# Patient Record
Sex: Male | Born: 1955 | Hispanic: Yes | Smoking: Former smoker
Health system: Southern US, Community
[De-identification: ages and names within clinical notes are randomized; demographics above are authoritative.]

## PROBLEM LIST (undated history)

## (undated) DIAGNOSIS — D696 Thrombocytopenia, unspecified: Secondary | ICD-10-CM

## (undated) DIAGNOSIS — Z86010 Personal history of colonic polyps: Secondary | ICD-10-CM

## (undated) DIAGNOSIS — K760 Fatty (change of) liver, not elsewhere classified: Secondary | ICD-10-CM

## (undated) DIAGNOSIS — M5030 Other cervical disc degeneration, unspecified cervical region: Secondary | ICD-10-CM

## (undated) DIAGNOSIS — M5137 Other intervertebral disc degeneration, lumbosacral region: Secondary | ICD-10-CM

## (undated) DIAGNOSIS — J439 Emphysema, unspecified: Secondary | ICD-10-CM

## (undated) DIAGNOSIS — K219 Gastro-esophageal reflux disease without esophagitis: Secondary | ICD-10-CM

## (undated) DIAGNOSIS — E119 Type 2 diabetes mellitus without complications: Secondary | ICD-10-CM

## (undated) DIAGNOSIS — Z9981 Dependence on supplemental oxygen: Secondary | ICD-10-CM

## (undated) DIAGNOSIS — K08109 Complete loss of teeth, unspecified cause, unspecified class: Secondary | ICD-10-CM

## (undated) DIAGNOSIS — I10 Essential (primary) hypertension: Secondary | ICD-10-CM

## (undated) DIAGNOSIS — Z794 Long term (current) use of insulin: Secondary | ICD-10-CM

## (undated) DIAGNOSIS — K5730 Diverticulosis of large intestine without perforation or abscess without bleeding: Secondary | ICD-10-CM

## (undated) DIAGNOSIS — J410 Simple chronic bronchitis: Secondary | ICD-10-CM

## (undated) DIAGNOSIS — I671 Cerebral aneurysm, nonruptured: Secondary | ICD-10-CM

## (undated) DIAGNOSIS — Z972 Presence of dental prosthetic device (complete) (partial): Secondary | ICD-10-CM

## (undated) DIAGNOSIS — G43909 Migraine, unspecified, not intractable, without status migrainosus: Secondary | ICD-10-CM

## (undated) DIAGNOSIS — Z860101 Personal history of adenomatous and serrated colon polyps: Secondary | ICD-10-CM

## (undated) DIAGNOSIS — Z8711 Personal history of peptic ulcer disease: Secondary | ICD-10-CM

## (undated) DIAGNOSIS — E785 Hyperlipidemia, unspecified: Secondary | ICD-10-CM

## (undated) DIAGNOSIS — I2510 Atherosclerotic heart disease of native coronary artery without angina pectoris: Secondary | ICD-10-CM

## (undated) DIAGNOSIS — Z973 Presence of spectacles and contact lenses: Secondary | ICD-10-CM

## (undated) DIAGNOSIS — Z8781 Personal history of (healed) traumatic fracture: Secondary | ICD-10-CM

## (undated) DIAGNOSIS — N529 Male erectile dysfunction, unspecified: Secondary | ICD-10-CM

## (undated) DIAGNOSIS — I252 Old myocardial infarction: Secondary | ICD-10-CM

## (undated) DIAGNOSIS — R0609 Other forms of dyspnea: Secondary | ICD-10-CM

## (undated) HISTORY — PX: UPPER GASTROINTESTINAL ENDOSCOPY: SHX188

## (undated) HISTORY — PX: SHOULDER ARTHROSCOPY W/ ROTATOR CUFF REPAIR: SHX2400

## (undated) HISTORY — DX: Emphysema, unspecified: J43.9

## (undated) HISTORY — PX: HERNIA REPAIR: SHX51

## (undated) HISTORY — PX: FOOT SURGERY: SHX648

## (undated) HISTORY — PX: PELVIC FRACTURE SURGERY: SHX119

---

## 1997-07-04 HISTORY — PX: FOOT SURGERY: SHX648

## 1997-07-04 HISTORY — PX: PELVIC FRACTURE SURGERY: SHX119

## 2008-01-04 ENCOUNTER — Emergency Department (HOSPITAL_COMMUNITY): Admission: EM | Admit: 2008-01-04 | Discharge: 2008-01-04 | Payer: Self-pay | Admitting: Emergency Medicine

## 2010-10-17 ENCOUNTER — Emergency Department (HOSPITAL_COMMUNITY)
Admission: EM | Admit: 2010-10-17 | Discharge: 2010-10-18 | Disposition: A | Discharge status: Home / Self Care | Payer: BC Managed Care – PPO | Attending: Emergency Medicine | Admitting: Emergency Medicine

## 2010-10-17 ENCOUNTER — Emergency Department (HOSPITAL_COMMUNITY): Payer: BC Managed Care – PPO

## 2010-10-17 DIAGNOSIS — R062 Wheezing: Secondary | ICD-10-CM | POA: Insufficient documentation

## 2010-10-17 DIAGNOSIS — R05 Cough: Secondary | ICD-10-CM | POA: Insufficient documentation

## 2010-10-17 DIAGNOSIS — R0989 Other specified symptoms and signs involving the circulatory and respiratory systems: Secondary | ICD-10-CM | POA: Insufficient documentation

## 2010-10-17 DIAGNOSIS — R059 Cough, unspecified: Secondary | ICD-10-CM | POA: Insufficient documentation

## 2010-10-17 DIAGNOSIS — R0609 Other forms of dyspnea: Secondary | ICD-10-CM | POA: Insufficient documentation

## 2010-10-17 DIAGNOSIS — J4489 Other specified chronic obstructive pulmonary disease: Secondary | ICD-10-CM | POA: Insufficient documentation

## 2010-10-17 DIAGNOSIS — J449 Chronic obstructive pulmonary disease, unspecified: Secondary | ICD-10-CM | POA: Insufficient documentation

## 2011-03-31 LAB — CBC
Hemoglobin: 16.8
MCV: 95.7
RBC: 5.07
WBC: 7.8

## 2011-03-31 LAB — POCT I-STAT, CHEM 8
Calcium, Ion: 1.1 — ABNORMAL LOW
Glucose, Bld: 137 — ABNORMAL HIGH
HCT: 52
Hemoglobin: 17.7 — ABNORMAL HIGH

## 2011-03-31 LAB — POCT CARDIAC MARKERS
CKMB, poc: 1.1
Myoglobin, poc: 60
Troponin i, poc: <0.05

## 2011-03-31 LAB — PROTIME-INR
INR: 0.9
Prothrombin Time: 12.8

## 2012-04-13 ENCOUNTER — Emergency Department (HOSPITAL_COMMUNITY): Payer: BC Managed Care – PPO

## 2012-04-13 ENCOUNTER — Emergency Department (HOSPITAL_COMMUNITY)
Admission: EM | Admit: 2012-04-13 | Discharge: 2012-04-14 | Disposition: A | Discharge status: Home / Self Care | Payer: BC Managed Care – PPO | Attending: Emergency Medicine | Admitting: Emergency Medicine

## 2012-04-13 ENCOUNTER — Encounter (HOSPITAL_COMMUNITY): Payer: Self-pay | Admitting: Emergency Medicine

## 2012-04-13 DIAGNOSIS — F17200 Nicotine dependence, unspecified, uncomplicated: Secondary | ICD-10-CM | POA: Insufficient documentation

## 2012-04-13 DIAGNOSIS — S42109A Fracture of unspecified part of scapula, unspecified shoulder, initial encounter for closed fracture: Secondary | ICD-10-CM

## 2012-04-13 DIAGNOSIS — M549 Dorsalgia, unspecified: Secondary | ICD-10-CM | POA: Insufficient documentation

## 2012-04-13 DIAGNOSIS — R109 Unspecified abdominal pain: Secondary | ICD-10-CM | POA: Insufficient documentation

## 2012-04-13 DIAGNOSIS — M25559 Pain in unspecified hip: Secondary | ICD-10-CM | POA: Insufficient documentation

## 2012-04-13 DIAGNOSIS — R937 Abnormal findings on diagnostic imaging of other parts of musculoskeletal system: Secondary | ICD-10-CM | POA: Insufficient documentation

## 2012-04-13 MED ORDER — ONDANSETRON HCL 4 MG/2ML IJ SOLN
4.0000 mg | Freq: Once | INTRAMUSCULAR | Status: AC
  Administered 2012-04-13: 4 mg via INTRAVENOUS
  Filled 2012-04-13: qty 2

## 2012-04-13 MED ORDER — HYDROMORPHONE HCL PF 1 MG/ML IJ SOLN
0.5000 mg | Freq: Once | INTRAMUSCULAR | Status: AC
  Administered 2012-04-13: 0.5 mg via INTRAVENOUS
  Filled 2012-04-13: qty 1

## 2012-04-13 MED ORDER — HYDROMORPHONE HCL PF 1 MG/ML IJ SOLN
1.0000 mg | Freq: Once | INTRAMUSCULAR | Status: AC
  Administered 2012-04-13: 1 mg via INTRAVENOUS

## 2012-04-13 MED ORDER — IOHEXOL 300 MG/ML  SOLN
100.0000 mL | Freq: Once | INTRAMUSCULAR | Status: AC | PRN
  Administered 2012-04-13: 100 mL via INTRAVENOUS

## 2012-04-13 MED ORDER — HYDROMORPHONE HCL PF 1 MG/ML IJ SOLN
INTRAMUSCULAR | Status: AC
  Filled 2012-04-13: qty 1
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
## 2012-04-14 MED ORDER — HYDROCODONE-ACETAMINOPHEN 5-325 MG PO TABS: 1.0000 | ORAL_TABLET | ORAL | Status: DC | PRN

## 2012-04-14 MED ORDER — CYCLOBENZAPRINE HCL 10 MG PO TABS: 10.0000 mg | ORAL_TABLET | Freq: Two times a day (BID) | ORAL | Status: DC | PRN

## 2012-04-14 MED ORDER — HYDROMORPHONE HCL PF 1 MG/ML IJ SOLN
0.5000 mg | Freq: Once | INTRAMUSCULAR | Status: AC
  Administered 2012-04-14: 0.5 mg via INTRAVENOUS
  Filled 2012-04-14: qty 1
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
## 2012-04-14 NOTE — ED Notes (Signed)
Per PA, the pt will need a shoulder immobilizer before discharge. Ortho Tech notified.

## 2012-12-03 ENCOUNTER — Encounter (HOSPITAL_COMMUNITY): Payer: Self-pay

## 2012-12-03 ENCOUNTER — Emergency Department (HOSPITAL_COMMUNITY): Payer: Self-pay

## 2012-12-03 ENCOUNTER — Emergency Department (HOSPITAL_COMMUNITY)
Admission: EM | Admit: 2012-12-03 | Discharge: 2012-12-04 | Disposition: A | Discharge status: Home / Self Care | Payer: Self-pay | Attending: Emergency Medicine | Admitting: Emergency Medicine

## 2012-12-03 DIAGNOSIS — J209 Acute bronchitis, unspecified: Secondary | ICD-10-CM | POA: Insufficient documentation

## 2012-12-03 DIAGNOSIS — F17200 Nicotine dependence, unspecified, uncomplicated: Secondary | ICD-10-CM | POA: Insufficient documentation

## 2012-12-03 DIAGNOSIS — J449 Chronic obstructive pulmonary disease, unspecified: Secondary | ICD-10-CM

## 2012-12-03 DIAGNOSIS — J4489 Other specified chronic obstructive pulmonary disease: Secondary | ICD-10-CM | POA: Insufficient documentation

## 2012-12-03 LAB — CBC
HCT: 43.9 % (ref 39.0–52.0)
Hemoglobin: 15.5 g/dL (ref 13.0–17.0)
MCH: 32.8 pg (ref 26.0–34.0)
MCV: 92.8 fL (ref 78.0–100.0)
RBC: 4.73 MIL/uL (ref 4.22–5.81)

## 2012-12-03 LAB — BASIC METABOLIC PANEL
CO2: 23 mEq/L (ref 19–32)
Calcium: 9.5 mg/dL (ref 8.4–10.5)
Chloride: 101 mEq/L (ref 96–112)
Creatinine, Ser: 0.9 mg/dL (ref 0.50–1.35)
Glucose, Bld: 77 mg/dL (ref 70–99)

## 2012-12-03 LAB — POCT I-STAT TROPONIN I: Troponin i, poc: 0 ng/mL (ref 0.00–0.08)

## 2012-12-03 MED ORDER — ALBUTEROL SULFATE (5 MG/ML) 0.5% IN NEBU
5.0000 mg | INHALATION_SOLUTION | Freq: Once | RESPIRATORY_TRACT | Status: AC
  Administered 2012-12-03: 5 mg via RESPIRATORY_TRACT
  Filled 2012-12-03: qty 1

## 2012-12-03 MED ORDER — SODIUM CHLORIDE 0.9 % IV SOLN
INTRAVENOUS | Status: DC
  Administered 2012-12-03: via INTRAVENOUS

## 2012-12-03 MED ORDER — IPRATROPIUM BROMIDE 0.02 % IN SOLN
0.5000 mg | Freq: Once | RESPIRATORY_TRACT | Status: AC
  Administered 2012-12-03: 0.5 mg via RESPIRATORY_TRACT
  Filled 2012-12-03: qty 2.5

## 2012-12-03 MED ORDER — ALBUTEROL SULFATE HFA 108 (90 BASE) MCG/ACT IN AERS
2.0000 | INHALATION_SPRAY | RESPIRATORY_TRACT | Status: DC | PRN
  Administered 2012-12-04: 2 via RESPIRATORY_TRACT
  Filled 2012-12-03: qty 6.7

## 2012-12-03 MED ORDER — METHYLPREDNISOLONE SODIUM SUCC 125 MG IJ SOLR
125.0000 mg | Freq: Once | INTRAMUSCULAR | Status: AC
  Administered 2012-12-03: 125 mg via INTRAVENOUS
  Filled 2012-12-03: qty 2

## 2012-12-03 MED ORDER — AEROCHAMBER Z-STAT PLUS/MEDIUM MISC
1.0000 | Freq: Once | Status: AC
  Administered 2012-12-04: 1

## 2012-12-03 MED ORDER — SODIUM CHLORIDE 0.9 % IV BOLUS (SEPSIS)
1000.0000 mL | Freq: Once | INTRAVENOUS | Status: AC
  Administered 2012-12-03: 1000 mL via INTRAVENOUS

## 2012-12-03 NOTE — ED Notes (Signed)
Family at bedside. 

## 2012-12-03 NOTE — ED Provider Notes (Signed)
History     CSN: [id number]  Arrival date & time 12/03/12  2139   First MD Initiated Contact with Patient 12/03/12 2231      Chief Complaint  Patient presents with  . Shortness of Breath    (Consider location/radiation/quality/duration/timing/severity/associated sxs/prior treatment) HPI Comments: Duane Cuevas is a 57 y.o. Male who complains of intermittent cough and shortness of breath for several months. The symptoms are aggravated by working outside. He continues to smoke. He denies fever, chills, nausea, vomiting, weakness, or dizziness. He's not currently taking any medications for his chronic emphysema. He has mild, left anterior chest pain, only when he coughs. The cough is productive of sputum, but he does not know what color it is. There are no other known modifying factors.  Patient is a 57 y.o. male presenting with shortness of breath. The history is provided by the patient.  Shortness of Breath   History reviewed. No pertinent past medical history.  Past Surgical History  Procedure Laterality Date  . Foot surgery      History reviewed. No pertinent family history.  History  Substance Use Topics  . Smoking status: Current Every Day Smoker -- 0.50 packs/day    Types: Cigarettes  . Smokeless tobacco: Never Used  . Alcohol Use: 3.6 oz/week    6 Cans of beer per week      Review of Systems  Respiratory: Positive for shortness of breath.   All other systems reviewed and are negative.    Allergies  Review of patient's allergies indicates no known allergies.  Home Medications   Current Outpatient Rx  Name  Route  Sig  Dispense  Refill  . predniSONE (DELTASONE) 20 MG tablet   Oral   Take 1 tablet (20 mg total) by mouth 2 (two) times daily.   10 tablet   0     BP 118/75  Pulse 81  Temp(Src) 98.9 F (37.2 C) (Oral)  Resp 20  SpO2 97%  Physical Exam  Nursing note and vitals reviewed. Constitutional: He is oriented to person, place, and time.  He appears well-developed and well-nourished.  HENT:  Head: Normocephalic and atraumatic.  Right Ear: External ear normal.  Left Ear: External ear normal.  Eyes: Conjunctivae and EOM are normal. Pupils are equal, round, and reactive to light.  Neck: Normal range of motion and phonation normal. Neck supple.  Cardiovascular: Normal rate, regular rhythm, normal heart sounds and intact distal pulses.   Pulmonary/Chest: Effort normal. No respiratory distress. He has wheezes. He exhibits no bony tenderness.  Decreased air movement bilaterally. No intercostal retractions. No increased work of breathing.  Abdominal: Soft. Normal appearance. There is no tenderness.  Musculoskeletal: Normal range of motion.  Neurological: He is alert and oriented to person, place, and time. He has normal strength. No cranial nerve deficit or sensory deficit. He exhibits normal muscle tone. Coordination normal.  Skin: Skin is warm, dry and intact.  Psychiatric: He has a normal mood and affect. His behavior is normal. Judgment and thought content normal.    ED Course  Procedures (including critical care time)  Initial evaluation is consistent with dehydration and bronchospasm. He'll be assessed for pneumonia. Signs of cardiac compromise and metabolic instability.   Medications  0.9 %  sodium chloride infusion ( Intravenous New Bag/Given 12/03/12 2345)  albuterol (PROVENTIL HFA;VENTOLIN HFA) 108 (90 BASE) MCG/ACT inhaler 2 puff (2 puffs Inhalation Given 12/04/12 0012)  aerochamber Z-Stat Plus/medium 1 each (not administered)  sodium chloride 0.9 % bolus  1,000 mL (0 mLs Intravenous Stopped 12/04/12 0039)  albuterol (PROVENTIL) (5 MG/ML) 0.5% nebulizer solution 5 mg (5 mg Nebulization Given 12/03/12 2323)  ipratropium (ATROVENT) nebulizer solution 0.5 mg (0.5 mg Nebulization Given 12/03/12 2323)  methylPREDNISolone sodium succinate (SOLU-MEDROL) 125 mg/2 mL injection 125 mg (125 mg Intravenous Given 12/03/12 2341)    Patient  Vitals for the past 24 hrs:  BP Temp Temp src Pulse Resp SpO2  12/04/12 0044 118/75 mmHg - - 81 20 97 %  12/04/12 0012 - - - - - 96 %  12/03/12 2325 - - - - - 100 %  12/03/12 2154 96/71 mmHg 98.9 F (37.2 C) Oral - 24 96 %     Date: 12/03/12  Rate: 75  Rhythm: normal sinus rhythm  QRS Axis: normal  PR and QT Intervals: normal  ST/T Wave abnormalities: nonspecific T wave changes  PR and QRS Conduction Disutrbances:none  Narrative Interpretation:   Old EKG Reviewed: unchanged- 10/17/10    Labs Reviewed  CBC  BASIC METABOLIC PANEL  PRO B NATRIURETIC PEPTIDE  POCT I-STAT TROPONIN I   Dg Chest 2 View  12/03/2012   *RADIOLOGY REPORT*  Clinical Data: Shortness of breath and cough  CHEST - 2 VIEW  Comparison: CT 04/13/2012  Findings: Left lower lobe granuloma is stable. Cardiomediastinal silhouette is within normal limits. The lungs are clear. No pleural effusion.  No pneumothorax.  No acute osseous abnormality.  IMPRESSION: Normal chest.   Original Report Authenticated By: Christiana Pellant, M.D.     1. COPD (chronic obstructive pulmonary disease)   2. Bronchitis, acute       MDM  Evaluation consistent with exacerbation of COPD. And pneumonia, PE, or ACD. Doubt metabolic instability, serious bacterial infection or impending vascular collapse; the patient is stable for discharge.   Nursing Notes Reviewed/ Care Coordinated, and agree without changes. Applicable Imaging Reviewed.  Interpretation of Laboratory Data incorporated into ED treatment    Plan: Home Medications- Prednisone, Albuterol Inhaler; Home Treatments- Stop Smoking; Recommended follow up- PCP of choice in 1 week          Flint Melter, MD 12/04/12 [phone]

## 2012-12-03 NOTE — ED Notes (Signed)
Pt complains of having trouble breathing since this evening, he's been coughing for several days, hx of emphysema

## 2012-12-04 MED ORDER — PREDNISONE 20 MG PO TABS: 20.0000 mg | ORAL_TABLET | Freq: Two times a day (BID) | ORAL | Status: DC

## 2012-12-04 NOTE — ED Notes (Signed)
Ambulated pt in hallway.  Pt had no complaints of SOB or dizziness.  O2 stats: 95-97%, pulse 93-95, during ambulation.

## 2013-07-31 ENCOUNTER — Emergency Department (HOSPITAL_COMMUNITY): Payer: Self-pay

## 2013-07-31 ENCOUNTER — Emergency Department (HOSPITAL_COMMUNITY)
Admission: EM | Admit: 2013-07-31 | Discharge: 2013-08-01 | Disposition: A | Discharge status: Home / Self Care | Payer: Self-pay | Attending: Emergency Medicine | Admitting: Emergency Medicine

## 2013-07-31 ENCOUNTER — Encounter (HOSPITAL_COMMUNITY): Payer: Self-pay | Admitting: Emergency Medicine

## 2013-07-31 DIAGNOSIS — R000 Tachycardia, unspecified: Secondary | ICD-10-CM | POA: Insufficient documentation

## 2013-07-31 DIAGNOSIS — J438 Other emphysema: Secondary | ICD-10-CM | POA: Insufficient documentation

## 2013-07-31 DIAGNOSIS — F17200 Nicotine dependence, unspecified, uncomplicated: Secondary | ICD-10-CM | POA: Insufficient documentation

## 2013-07-31 DIAGNOSIS — Z79899 Other long term (current) drug therapy: Secondary | ICD-10-CM | POA: Insufficient documentation

## 2013-07-31 DIAGNOSIS — J441 Chronic obstructive pulmonary disease with (acute) exacerbation: Secondary | ICD-10-CM

## 2013-07-31 HISTORY — DX: Emphysema, unspecified: J43.9

## 2013-07-31 MED ORDER — GUAIFENESIN 100 MG/5ML PO LIQD: 100.0000 mg | ORAL | Status: DC | PRN

## 2013-07-31 MED ORDER — ALBUTEROL SULFATE HFA 108 (90 BASE) MCG/ACT IN AERS
2.0000 | INHALATION_SPRAY | RESPIRATORY_TRACT | Status: DC | PRN
  Administered 2013-08-01: 2 via RESPIRATORY_TRACT
  Filled 2013-07-31: qty 6.7

## 2013-07-31 MED ORDER — IPRATROPIUM-ALBUTEROL 0.5-2.5 (3) MG/3ML IN SOLN
3.0000 mL | Freq: Once | RESPIRATORY_TRACT | Status: AC
  Administered 2013-07-31: 3 mL via RESPIRATORY_TRACT
  Filled 2013-07-31: qty 3

## 2013-07-31 MED ORDER — ALBUTEROL SULFATE (2.5 MG/3ML) 0.083% IN NEBU
5.0000 mg | INHALATION_SOLUTION | Freq: Once | RESPIRATORY_TRACT | Status: AC
  Administered 2013-07-31: 2.5 mg via RESPIRATORY_TRACT
  Filled 2013-07-31: qty 6

## 2013-07-31 MED ORDER — IPRATROPIUM BROMIDE 0.02 % IN SOLN: 0.5000 mg | Freq: Once | RESPIRATORY_TRACT | Status: DC

## 2013-07-31 MED ORDER — PREDNISONE 20 MG PO TABS
60.0000 mg | ORAL_TABLET | Freq: Once | ORAL | Status: AC
  Administered 2013-07-31: 60 mg via ORAL
  Filled 2013-07-31: qty 3

## 2013-07-31 MED ORDER — BENZONATATE 100 MG PO CAPS
100.0000 mg | ORAL_CAPSULE | Freq: Once | ORAL | Status: AC
  Administered 2013-07-31: 100 mg via ORAL
  Filled 2013-07-31: qty 1

## 2013-07-31 MED ORDER — BENZONATATE 100 MG PO CAPS: 100.0000 mg | ORAL_CAPSULE | Freq: Three times a day (TID) | ORAL | Status: DC

## 2013-07-31 MED ORDER — PREDNISONE 20 MG PO TABS: ORAL_TABLET | ORAL | Status: DC

## 2013-07-31 NOTE — Discharge Instructions (Signed)
Enfermedad pulmonar obstructiva crnica (Chronic Obstructive Pulmonary Disease) La enfermedad pulmonar obstructiva crnica (EPOC) es una enfermedad pulmonar comn en la que el flujo de aire que proviene de los pulmones est restringido. EPOC es un trmino general que puede utilizarse para describir muchas enfermedades pulmonares distintas que restringen el flujo de aire, incluidos bronquitis crnica y enfisema. Si tiene EPOC, el funcionamiento de sus pulmones probablemente nunca vuelva a ser normal, pero puede tomar medidas para mejorar la funcin pulmonar y comenzar a Sports administrator.  CAUSAS   Fumar (comn).  Exposicin al humo como fumador pasivo.  Problemas genticos.  Enfermedades pulmonares inflamatorias crnicas o infecciones recurrentes. SNTOMAS   Falta de aire, especialmente con la actividad fsica.  Tos profunda, persistente (crnica) con gran produccin de moco espeso.  Sibilancias.  Respiraciones rpidas (taquipnea).  Coloracin gris o azulada de la piel (cianosis), especialmente en los dedos de las manos, de los pies o en los labios.  Fatiga.  Prdida de peso.  Infecciones frecuentes o episodios en los que los sntomas respiratorios empeoran mucho (exacerbaciones).  Opresin en el pecho. DIAGNSTICO  El mdico le har una historia clnica y un examen fsico para Optometrist un diagnstico inicial. Las pruebas adicionales para el EPOC incluyen:   Pruebas de la funcin pulmonar.  Radiografa de trax.  Tomografa computada.  Anlisis de [place]. TRATAMIENTO  El tratamiento disponible para ayudarlo a sentirse mejor cuando tiene EPOC incluye lo siguiente:   Inhaladores y nebulizadores. Estos medicamentos ayudan a Chief Technology Officer los sntomas de EPOC y hacen que la respiracin sea ms cmoda.  Oxgeno complementario. El oxgeno complementario solo es til si tiene un nivel bajo de oxgeno en la sangre.  Ejercicios y [place] fsica. Estos son beneficiosos para  prcticamente todas las personas con EPOC. Algunas personas tambin pueden beneficiarse con un programa de rehabilitacin pulmonar. [place] todos los medicamentos (inhalados o pldoras) Programmer, systems.  Slo tome medicamentos de venta libre o recetados para Glass blower/designer, Health and safety inspector o bajar la fiebre, segn las indicaciones de su mdico.  Evite los medicamentos de venta libre o jarabes para la tos que secan las vas respiratorias (como los antihistamnicos) y Adult nurse eliminacin de las secreciones, a menos que su mdico le indique lo contrario.  Si es fumador, lo ms importante que puede hacer es dejar el hbito. Si contina fumando, el dao a los pulmones ser mayor y habr ms problemas respiratorios. Hable con su mdico para obtener ayuda para dejar el cigarrillo. l podr recomendarle recursos u hospitales comunitarios que brindan apoyo.  Evite la exposicin a factores irritantes, como el humo, productos qumicos y vapores, porque pueden agravar el trastorno.  Use la terapia con oxgeno y la rehabilitacin pulmonar si se lo indica su mdico. Si requiere terapia con oxgeno en su casa, consulte a su mdico si debe comprar un pulsioxmetro para medir su nivel de oxgeno en casa.  Evite el contacto con personas que tengan una enfermedad contagiosa.  Evite los cambios extremos de temperatura y humedad .  Consuma alimentos saludables. Consumir porciones ms pequeas y frecuentes y Production assistant, radio antes de las comidas puede ayudar a Theatre manager su fuerza.  Mantngase activo, pero equilibre la [place] con perodos de descanso. La prctica de ejercicios y [place] fsica lo ayudar a Theatre manager la capacidad de Field seismologist las cosas que desea Field seismologist.  Si tiene EPOC, es muy importante prevenir la infeccin y la hospitalizacin. Asegrese de recibir todas las [employer]  recomiende el mdico, especialmente las vacunas contra el neumococo y la gripe.  Pregntele a su mdico si necesita una vacuna contra la neumona.  Aprenda y utilice tcnicas de relajacin para controlar el estrs.  Aprenda y [place] tcnicas de respiracin controlada segn las indicaciones de su mdico. Las tcnicas de respiracin controlada incluyen:  Respiracin con los labios fruncidos. Comience inspirando (inhalando) por la nariz durante 1segundo. Luego frunza los labios como si fuera a silbar y espire (exhale) por los labios fruncidos durante 2segundos.  Respiracin diafragmtica. Comience colocando [employer] el abdomen, justo por encima de la cintura. Inhale lentamente por la Doran Durand. La mano que se encuentra sobre el abdomen debe moverse. Luego frunza los labios y exhale lentamente. Tiene que sentir que la mano que est sobre el abdomen se mueve al exhalar.  Aprenda y [employer] tos controlada para despejar la mucosidad de los pulmones. La tos controlada consiste en realizar una serie de toses cortas y progresivas. Los pasos para la tos controlada son: 1. Incline la cabeza ligeramente hacia adelante. 2. Inspire profundamente utilizando la respiracin diafragmtica. 3. Trate de mantener el aire por 3 segundos. [place] veces. 5. Escupa el moco en un pauelo. 6. Descanse y repita los pasos una o dos veces segn sea necesario. SOLICITE ATENCIN MDICA SI:   Tose y elimina ms moco que lo habitual.  Hay un cambio en el color o en la consistencia del moco.  Le cuesta respirar ms de lo normal.  La respiracin es ms rpida que lo habitual. SOLICITE ATENCIN MDICA DE INMEDIATO SI:   Tiene dificultad para respirar mientras est en reposo.  Tiene dificultad para respirar y Theme park manager lo siguiente:  Clinical research associate.  Realizar las actividades fsicas habituales.  Siente un dolor de pecho que dura ms de 5 minutos.  Tiene la piel ms ciantica.  El pulsioxmetro detecta saturaciones de oxgeno  bajas durante ms de 15minutos. ASEGRESE DE QUE:   Comprende estas instrucciones.  Controlar su afeccin.  Recibir ayuda de inmediato si no mejora o si empeora. Document Released: 03/30/2005 Document Revised: 04/10/2013 [facility] Patient Information 2014 [place], [place].   Emergency Department Resource Guide 1) Find a Doctor and Pay Out of Pocket Although you won't have to find out who is covered by your insurance plan, it is a good idea to ask around and get recommendations. You will then need to call the office and see if the doctor you have chosen will accept you as a new patient and what types of options they offer for patients who are self-pay. Some doctors offer discounts or will set up payment plans for their patients who do not have insurance, but you will need to ask so you aren't surprised when you get to your appointment.  2) Contact Your Local Health Department Not all health departments have doctors that can see patients for sick visits, but many do, so it is worth a call to see if yours does. If you don't know where your local health department is, you can check in your phone book. The CDC also has a tool to help you locate your state's health department, and many state websites also have listings of all of their local health departments.  3) Find a [facility] If your illness is not likely to be very severe or complicated, you may want to try a walk in clinic. These are popping up all over the country in pharmacies, drugstores,  and shopping centers. They're usually staffed by nurse practitioners or physician assistants that have been trained to treat common illnesses and complaints. They're usually fairly quick and inexpensive. However, if you have serious medical issues or chronic medical problems, these are probably not your best option.  No Primary Care Doctor: - Call Health Connect at  [phone] - they can help you locate a primary care doctor that  accepts your  insurance, provides certain services, etc. - Physician Referral Service- [phone]  Chronic Pain Problems: Organization         Address  Phone   Notes  [facility]  [phone] Patients need to be referred by their primary care doctor.   Medication Assistance: Organization         Address  Phone   Notes  [facility] Medication [facility] [address] [place] [place], [place] [address] [phone] --Must be a resident of [facility] -- Must have NO insurance coverage whatsoever (no Medicaid/ Medicare, etc.) -- The pt. MUST have a primary care doctor that directs their care regularly and follows them in the community   MedAssist  [phone]   [employer]  [phone]    Agencies that provide inexpensive medical care: Organization         Address  Phone   Notes  [facility]  [phone]   [facility]    [phone]   [facility] [address] [place], [place] [address] [phone]   [facility] [address] [place] [place] [phone]   Planned Parenthood    [phone]   [facility]    [phone]   [facility] and [facility]  [address], [place] Phone:  [phone], Fax:  [phone] Hours of Operation:  9 am - 6 pm, M-F.  Also accepts Medicaid/Medicare and self-pay.  [facility]  [address], [place] Phone: [phone], Fax: [phone]. Hours of Operation:  8:30 am - 5:30 pm, M-F.  Also accepts Medicaid and self-pay.  [facility] [place] [facility] [address], [place] Phone: [phone]   [facility] [address] Natasha Bence [place], [place] [phone], Ext. [phone] Mondays & Thursdays: 7-9 AM.  First 15 patients are seen on a first come, first serve basis.    Medicaid-accepting [facility] Providers:  Organization          Address  Phone   Notes  [facility] [address], [place] [phone] Also accepts self-pay patients.  [facility] [address] Laurell Josephs [place], [place]  [phone]   [facility] [address], [place] [phone]   [facility] [address], [place] [phone]   Renaye Rakers [address], [place]   [phone] Only accepts [place] Access [place] patients after they have their name applied to their card.   Self-Pay (no insurance) in [facility]:  Organization         Address  Phone   Notes  Sickle Cell Patients, [facility] [address] [place], [place] [phone]   [facility] [address] [place], [place] [phone]   [facility] [place]  [address], [place] [phone])  [phone]   [facility]/Dr. Vista Lawman  [address], [place] or [address], [place] [phone] Phone number for both [place] and [place] locations is the same.  [facility] [address], [place] [phone]   [facility] [address] [place] or [address] [phone]   [facility] [address], [place] [phone], phone; [phone], fax Sees patients 1st and 3rd Saturday of every month.  Must not qualify for public or private insurance (i.e. Medicaid, Medicare, [place] Health Choice, Veterans' Benefits)  Household income should be no more than 200% of the poverty level The clinic cannot treat you if you are pregnant or think you are pregnant  Sexually transmitted diseases are not treated at the clinic.    Dental Care: Organization         Address  Phone  Notes  [facility] Department of [facility] [phone] Accepts children up to age 47 who are enrolled in [place] or [place]; pregnant women with a Medicaid card; and children who have applied for Medicaid or [place] Health Choice, but were declined, whose parents can pay a reduced fee at time of service.  [facility] Department of [facility]  [address], [place] [phone] Accepts children up to age 58 who are enrolled in [place] or [place]; pregnant women with a Medicaid card; and children who have applied for Medicaid or [place] Health Choice, but were declined, whose parents can pay a reduced fee at time of service.  [facility] PRO[facility]  [place] [phone] Patients are seen by appointment only. Walk-ins are not accepted. [place] will see patients 52 years of age and older. Monday - Tuesday (8am-5pm) Most Wednesdays (8:30-5pm) $30 per visit, cash only  [facility] Adult Dental Access PROGRAM  [address], [facility] [phone] Patients are seen by appointment only. Walk-ins are not accepted. [place] will see patients 55 years of age and older. One Wednesday Evening (Monthly: Volunteer Based).  $30 per visit, cash only  [place]  [phone] for adults; Children under age 40, call Graduate Pediatric Dentistry at [phone]. Children aged 38-14, please call [phone] to request a pediatric application.  Dental services are provided in all areas of dental care including fillings, crowns and bridges, complete and partial dentures, implants, gum treatment, root canals, and extractions. Preventive care is also provided. Treatment is provided to both adults and children. Patients are selected via a lottery and there is often a waiting list.   [facility] [address] [place]  [phone] [email or web address]   [facility] [address] [place], [place]  [phone], Ext. [phone] Second and Fourth Thursday of each month, opens at 6:30 AM; Clinic ends at 9 AM.  Patients are seen on a first-come first-served basis, and a limited number are seen during each clinic.   [facility]  [address] Hillard Danker [place], [place] [phone]   Eligibility Requirements You must have lived in [place], [place], or [place] counties for at least the last three months.   You cannot be eligible for state or federal sponsored [employer], including [employer], [place], or [employer].   You generally cannot be eligible  for healthcare insurance through your employer.    How to apply: Eligibility screenings are held every Tuesday and Wednesday afternoon from 1:00 pm until 4:00 pm. You do not need an appointment for the interview!  [facility] [address], [place], [place]   [place]  [place] Department  [place]  [phone]    Behavioral Health Resources in the Community: Intensive Outpatient Programs Organization         Address  Phone  Notes  [place]. [address] [place], [place] [phone]   [facility] Outpatient [address] [place], [place]   ADS: [facility] & Drug S[facility] [address] [place], [place]   [place] [address]  [place], [place] or [phone]   Substance Abuse Resources Organization         Address  Phone  Notes  Alcohol and Drug Services  [phone]   [place]  [phone]   The [place]   Chinita Pester  [phone]   Residential & Outpatient Substance Abuse Program  [phone]   Psychological Services Organization         Address  Phone  Notes  [facility] [place]  [place]  [phone]    [place] [address] [place] or [phone]    Mobile Crisis Teams Organization         Address  Phone  Notes  [facility], Mobile Crisis Care Unit  [phone]   [facility]  [address] [place], [place]   [place] Levels [address], [place] [phone]    Self-Help/Support Groups Organization         Address  Phone             Notes  [place]. of [place] - variety of support groups  [place] Call for more information  Narcotics Anonymous (NA), [facility] [address], [employer] [place]  2 meetings at this location   Special educational needs teacher         Address  Phone  Notes  [facility] [place],    [place]  [phone]   [facility]  [address], [place] [phone], [place], [place]   [place], [place] [phone] Admissions: 8am-3pm M-F  Incentives Substance [place] [address]    [place], [place] [phone]   [facility] [address] [place], [place], [place]   [facility] [address]  [place], [place]   [facility] Programs - Intensive Outpatient [address] Kristeen [address], [place], [place]   [facility] ([place].,  [place], [place] [phone] or [phone]   [facility] Services (RTS) [address], [place], [place] Accepts Medicaid  [facility] [place] [address]  [place] [phone] Substance Abuse/Addiction Treatment   [facility] Organization         Address  Phone  Notes  [facility]  [phone]   Domenic Schwab, PhD [address] [place], [place]   [phone] or [phone]   [place]  [place],  [place] [phone]   [facility] [address] [place], [place] [phone] Insurance/Medicaid/sponsorship through [employer] and Families [address], [address] [place], [place] [phone] [place] [address]   [place], [place] [phone]    Dr. Adele Schilder  [phone]   [facility] Dept. 1) [address] [place] 2) [place] 3)  [place] 65, [place] [phone]  [phone]   [place] [phone] or [phone] (After Hours)

## 2013-07-31 NOTE — ED Notes (Addendum)
Pt c/o increasing SOB and L side chest pain x 1 month.  Pain score 8/10.  Hx COPD and Emphysema.

## 2013-07-31 NOTE — ED Notes (Signed)
Respiratory Tech made aware of neb treatment ordered.

## 2013-07-31 NOTE — ED Notes (Signed)
While ambulating pt with pulse ox it was noted that although pt kept pulse ox up at 94%, his breathing labored and his respiration rate jumped from 18/min to 26/min. RN made aware

## 2013-07-31 NOTE — ED Notes (Signed)
[place] PA at bedside 

## 2013-07-31 NOTE — ED Provider Notes (Signed)
CSN: [id number]     Arrival date & time 07/31/13  1827 History   First MD Initiated Contact with Patient 07/31/13 1850     Chief Complaint  Patient presents with  . Shortness of Breath   (Consider location/radiation/quality/duration/timing/severity/associated sxs/prior Treatment) HPI  58 year old male who is a smoker with history of COPD presents to the ED for evaluations of shortness of breath. He is a Spanish-speaking patient, family member at bedside was used as Astronomer per patient's request.  I did offer Nurse, children's.  Patient reports for the past month he has had persistent cough and shortness of breath with associated wheezing. Symptoms worsen at nighttime when he tried to sleep. Cough gets progressively worse, occasional productive, with associate pleuritic chest pain and shortness of breath. He tries using his rescue inhaler but states it provides absolutely no relief. He admits to be a smoker. Denies having fever, chills, or runny nose, sneeze, sore throat, or rash. Denies nausea vomiting or diarrhea, or abdominal pain. Patient   sleeps with one pillow. No significant risk factors concerning for PE. No other specific treatment tried. Does reports having some mild weight loss and occasional night sweats but no history of cancer   Past Medical History  Diagnosis Date  . COPD (chronic obstructive pulmonary disease)   . Emphysema/COPD    Past Surgical History  Procedure Laterality Date  . Foot surgery     History reviewed. No pertinent family history. History  Substance Use Topics  . Smoking status: Current Every Day Smoker -- 1.00 packs/day    Types: Cigarettes  . Smokeless tobacco: Never Used  . Alcohol Use: 3.6 oz/week    6 Cans of beer per week    Review of Systems  All other systems reviewed and are negative.    Allergies  Review of patient's allergies indicates no known allergies.  Home Medications   Current Outpatient Rx  Name  Route  Sig   Dispense  Refill  . albuterol (PROVENTIL HFA;VENTOLIN HFA) 108 (90 BASE) MCG/ACT inhaler   Inhalation   Inhale 2 puffs into the lungs every 6 (six) hours as needed for wheezing or shortness of breath.          BP 128/74  Pulse 100  Temp(Src) 98.3 F (36.8 C) (Oral)  Resp 24  SpO2 93% Physical Exam  Constitutional: He appears well-developed and well-nourished. No distress.  HENT:  Head: Atraumatic.  Right Ear: External ear normal.  Left Ear: External ear normal.  Mouth/Throat: Oropharynx is clear and moist.  Eyes: Conjunctivae are normal.  Neck: Normal range of motion. Neck supple. No tracheal deviation present.  Cardiovascular: Intact distal pulses.   Tachycardia without murmurs, rubs, or gallops  Pulmonary/Chest: He has wheezes. He has rales. He exhibits no tenderness.  Inspiratory and expiratory wheezes, with rales noted to left lower lobe. Patient in mild respiratory distress, persistent cough, but able to speak in complete sentences   Abdominal: Soft. There is no tenderness.  Musculoskeletal: He exhibits no edema.  Lymphadenopathy:    He has no cervical adenopathy.  Neurological: He is alert.  Skin: No rash noted.  Psychiatric: He has a normal mood and affect.    ED Course  Procedures (including critical care time)  7:17 PM Patient here with worsening shortness of breath, wheezing, and persistent cough. His sats at 92% on room air, is tachycardic and tachypnea.  9:38 PM Chest x-ray shows no evidence of acute pneumonia. Patient sats at 94% while ambulating.  Lungs  improved after initial albuterol/atrovent treatment, however can benefit from further nebs, will continue with treatment.  I suspect COPD exacerbation as the cause.  Due to the duration of sxs (1 mos) i have low suspicion for PE, especially considering pt has no significant risk factors.    11:04 PM Patient is stable for discharge. He will starts on prednisone taper course, cough medication, albuterol  inhaler as needed and close f/u with PCP for further care.  Return precaution discussed.    Labs Review Labs Reviewed - No data to display Imaging Review Dg Chest 2 View  07/31/2013   CLINICAL DATA:  Cough and congestion  EXAM: CHEST  2 VIEW  COMPARISON:  12/03/2012  FINDINGS: The heart size and mediastinal contours are within normal limits. Both lungs are clear. The visualized skeletal structures are unremarkable. Stable punctate calcified granuloma left lower lobe.  IMPRESSION: No active cardiopulmonary disease.   Electronically Signed   By: Daryll Brod M.D.   On: 07/31/2013 20:06    EKG Interpretation   None       MDM   1. COPD exacerbation    BP 128/74  Pulse 100  Temp(Src) 98.3 F (36.8 C) (Oral)  Resp 24  SpO2 94%  I have reviewed nursing notes and vital signs. I personally reviewed the imaging tests through PACS system  I reviewed available ER/hospitalization records thought the EMR     Domenic Moras, [place] 07/31/13 2308

## 2013-08-01 NOTE — ED Provider Notes (Signed)
Medical screening examination/treatment/procedure(s) were performed by non-physician practitioner and as supervising physician I was immediately available for consultation/collaboration.  EKG Interpretation   None        Virgel Manifold, MD 08/01/13 [phone]

## 2013-11-12 ENCOUNTER — Emergency Department (HOSPITAL_COMMUNITY): Payer: BC Managed Care – PPO

## 2013-11-12 ENCOUNTER — Emergency Department (HOSPITAL_COMMUNITY)
Admission: EM | Admit: 2013-11-12 | Discharge: 2013-11-12 | Disposition: A | Discharge status: Home / Self Care | Payer: BC Managed Care – PPO | Attending: Emergency Medicine | Admitting: Emergency Medicine

## 2013-11-12 ENCOUNTER — Encounter (HOSPITAL_COMMUNITY): Payer: Self-pay | Admitting: Emergency Medicine

## 2013-11-12 DIAGNOSIS — J449 Chronic obstructive pulmonary disease, unspecified: Secondary | ICD-10-CM

## 2013-11-12 DIAGNOSIS — J440 Chronic obstructive pulmonary disease with acute lower respiratory infection: Secondary | ICD-10-CM | POA: Insufficient documentation

## 2013-11-12 DIAGNOSIS — Z791 Long term (current) use of non-steroidal anti-inflammatories (NSAID): Secondary | ICD-10-CM | POA: Insufficient documentation

## 2013-11-12 DIAGNOSIS — J209 Acute bronchitis, unspecified: Secondary | ICD-10-CM | POA: Insufficient documentation

## 2013-11-12 DIAGNOSIS — F17200 Nicotine dependence, unspecified, uncomplicated: Secondary | ICD-10-CM | POA: Insufficient documentation

## 2013-11-12 DIAGNOSIS — Z720 Tobacco use: Secondary | ICD-10-CM

## 2013-11-12 MED ORDER — ALBUTEROL SULFATE HFA 108 (90 BASE) MCG/ACT IN AERS
2.0000 | INHALATION_SPRAY | RESPIRATORY_TRACT | Status: DC | PRN
  Administered 2013-11-12: 2 via RESPIRATORY_TRACT
  Filled 2013-11-12: qty 6.7

## 2013-11-12 MED ORDER — IPRATROPIUM BROMIDE 0.02 % IN SOLN
0.5000 mg | Freq: Once | RESPIRATORY_TRACT | Status: AC
  Administered 2013-11-12: 0.5 mg via RESPIRATORY_TRACT
  Filled 2013-11-12: qty 2.5

## 2013-11-12 MED ORDER — PREDNISONE 20 MG PO TABS: 20.0000 mg | ORAL_TABLET | Freq: Two times a day (BID) | ORAL | Status: DC

## 2013-11-12 MED ORDER — AZITHROMYCIN 250 MG PO TABS: ORAL_TABLET | ORAL | Status: DC

## 2013-11-12 MED ORDER — ALBUTEROL SULFATE (2.5 MG/3ML) 0.083% IN NEBU
2.5000 mg | INHALATION_SOLUTION | Freq: Once | RESPIRATORY_TRACT | Status: AC
  Administered 2013-11-12: 2.5 mg via RESPIRATORY_TRACT
  Filled 2013-11-12: qty 3

## 2013-11-12 MED ORDER — AEROCHAMBER Z-STAT PLUS/MEDIUM MISC
1.0000 | Freq: Once | Status: AC
  Administered 2013-11-12: 1
  Filled 2013-11-12: qty 1

## 2013-11-12 NOTE — Discharge Instructions (Signed)
Bronquitis aguda ( Acute Bronchitis) La bronquitis es una inflamacin de las vas respiratorias que se extienden desde la trquea Lubrizol Corporationhasta los pulmones (bronquios). La inflamacin produce la formacin de mucosidad. Esto produce tos, que es el sntoma ms frecuente de la bronquitis.  Cuando la bronquitis es [place]uda, generalmente comienza de The College of New Jerseymanera sbita y desaparece luego de un par de semanas. El hbito de fumar, las alergias y el asma pueden empeorar la bronquitis. Los episodios repetidos de bronquitis pueden causar ms problemas pulmonares.  CAUSAS La causa ms frecuente de bronquitis aguda es el mismo virus que produce el resfro. El virus se propaga de persona a persona (contagioso).  SIGNOS Y SNTOMAS   Tos.   Grant RutsFiebre.   Tos con mucosidad.   Dolores PepsiCoen el cuerpo.   Congestin en el pecho.   Escalofros.   Falta de aire.   Dolor de Advertising copywritergarganta.  DIAGNSTICO  La bronquitis aguda en general se diagnostica con un examen fsico. En algunos casos se indican otros estudios, como radiografas, para Risk managerdescartar otras enfermedades.  TRATAMIENTO  La bronquitis aguda generalmente desaparece en un par de semanas. Con frecuencia no es Quarry managernecesario realizar un tratamiento. Los medicamentos se indican para aliviar la fiebre o la tos. Generalmente no es necesario el uso de antibiticos, Biomedical engineerpero pueden indicarse en ciertas ocasiones. En algunos casos, se recomienda el uso de un inhalador para mejorar la falta de aire y Scientist, physiologicalcontrolar la tos. Un vaporizador de aire fro podr ayudarlo a MeadWestvacodisolver las secreciones bronquiales y Statisticianfacilitar su eliminacin.  INSTRUCCIONES PARA EL CUIDADO EN EL HOGAR  Descanse lo suficiente.   Beba lquidos en abundancia para mantener la orina de color claro o amarillo plido (excepto que padezca una enfermedad que requiera la restriccin de lquidos). Tome mucho lquido para Restaurant manager, fast fooddisolver las secreciones y Statisticianevitar la deshidratacin.   Tome slo medicamentos de venta libre o recetados,  segn las indicaciones del mdico.   Evite fumar o aspirar el humo de otros fumadores. La exposicin al humo del cigarrillo o a irritantes qumicos har que la bronquitis empeore. Si fuma, considere el uso de goma de Theatre managermascar o la aplicacin de parches en la piel que contengan nicotina para Paramedicaliviar los sntomas de abstinencia. Si deja de fumar, sus pulmones se curarn ms rpido.   Reduzca la probabilidad de otro brote de bronquitis aguda lavando sus manos con frecuencia, evitando a las personas que tengan sntomas y tratando de no tocarse las manos con la boca, la nariz o los ojos.   Concurra a las consultas de control con su mdico segn las indicaciones.  SOLICITE ATENCIN MDICA SI: Los sntomas no mejoran despus de 1 semana de tratamiento.  SOLICITE ATENCIN MDICA DE INMEDIATO SI:  Comienza a tener fiebre o escalofros cada vez ms intensos.   Siente dolor en el pecho.   Le falta el aire de manera preocupante.  La flema tiene [place]angre.   Se deshidrata.  Se desmaya.  Tiene vmitos que se repiten.  Tiene un dolor de cabeza intenso. ASEGRESE DE QUE:   Comprende estas instrucciones.  Controlar su afeccin.  Recibir ayuda de inmediato si no mejora o si empeora. Document Released: 06/20/2005 Document Revised: 02/20/2013 [facility] Patient Information 2014 [place], [place].  Adiccin a la nicotina  (Nicotine Addiction) La nicotina puede actuar como un estimulante (excita/activa) y un sedante (calma/tranquiliza). Inmediatamente despus de la exposicin a la nicotina, hay una "efecto ", causado en parte por la estimulacin de la glndula adrenal que provoca una descarga de adrenalina (epinefrina). La descarga  de adrenalina estimula al cuerpo y causa una liberacin repentina de azcar. Esto significa que los fumadores siempre estn un poco hiperglucmicos. Hiperglucmico significa que el nivel de azcar en la sangre es alto, al igual que en los diabticos. Tambin la nicotina  disminuye la cantidad de insulina que interviene en el control de los niveles de azcar en el organismo. Hay un aumento de la presin arterial, la respiracin y el ritmo cardaco.  Adems, la nicotina indirectamente provoca una liberacin de dopamina en el cerebro, que Environmental health practitioner y la motivacin. Una reaccin similar se observa con otras drogas, como la cocana y la herona. Se cree que esta liberacin de dopamina causa las sensaciones placenteras al fumar. En algunos [employer], la nicotina puede crear un efecto calmante, dependiendo de la sensibilidad del sistema nervioso del fumador y la dosis de nicotina consumida.  QU OCURRE CUANDO SE CONSUME NICOTINA DURANTE LARGOS PERODOS?   El consumo a permanente de nicotina causa adiccin. Es difcil de Scientist, water quality.  El consumo repetido de nicotina crea tolerancia. Se necesitan dosis ms altas de nicotina para obtener el "efecto ". Cuando se detiene el consumo de nicotina, la abstinencia puede durar un mes o ms. Los sntomas de abstinencia pueden comenzar a las pocas horas despus del ltimo cigarrillo. Los sntomas tienen un pico en los primeros das y pueden disminuir luego de Corporate treasurer. Para [employer], sin embargo, los sntomas pueden durar meses o ms. Estos sntomas son:   Irritabilidad.  Deseo irrefrenable.  Dficit en el aprendizaje y la atencin.  Trastornos del sueo.  Aumento del apetito. El deseo de tabaco puede durar 6 meses o ms. Muchos comportamientos que ocurren durante el consumo de nicotina tambin pueden desempear un papel en la gravedad de los sntomas de abstinencia. Para algunas personas, el tacto, el olfato y la visin de un cigarrillo y el rito de Therapist, music, Chiropractor, Educational psychologist y Hydrologist cigarrillo estn estrechamente vinculados con Investment banker, corporate de fumar. Cuando se suspende el uso, tambin se pierden los comportamientos relacionados que empeoran la abstinencia o los antojos. Mientras que el chicle y los  parches de nicotina pueden atenuar los efectos de la abstinencia, las ansias a menudo persisten.  CULES SON LAS CONSECUENCIAS MDICAS DEL USO DE LA NICOTINA?   La adiccin a la nicotina tiene incidencia en un tercio de todos los tipos de Hotel manager. El tipo de cncer en el que ms incide el tabaco es el cncer de pulmn. El cncer de pulmn es la principal causa de muerte por cncer tanto en hombres como en mujeres.  Tambin fumar est asociado con cncer de:  Glorieta.  Faringe.  Laringe.  Esfago  Estmago.  Pncreas.  Cuello uterino.  Rin.  Urter.  Vejiga.  El fumar tambin causa enfermedades pulmonares permanentes (crnicas) como la bronquitis y [place].  Empeora el asma en adultos y nios.  El fumar aumenta el riesgo de enfermedades cardacas incluyendo:  Ictus.  Ataques cardacos.  Enfermedades vasculares.  Aneurisma.  El ser fumador pasivo tambin puede aumentar los riesgos de enfermedades mdicas que incluyen:  [employer].  Sndrome de Muerte Sbita del Lactante (SMSL).  Adems, los cigarrillos que se caen son la causa principal de muertes por incendios residenciales.  La intoxicacin por nicotina es causa de ingestin accidental de productos del tabaco por parte de nios y Fiserv. La muerte generalmente ocurre dentro de unos minutos por insuficiencia respiratoria (cuando la persona deja de Ambulance person) causada por parlisis. TRATAMIENTO  Medicamentos. Medicamentos de reemplazo de la nicotina, como el chicle y el parche de nicotina, se utilizan para dejar de fumar. Estos medicamentos reducen gradualmente la dosis de nicotina que necesita el organismo. Estos medicamentos no contienen monxido de carbono ni otras toxinas presentes en el humo del tabaco.  Hipnoterapia.  Terapia de relajacin.  Nicotine Anonymous (un programa de apoyo de 12 pasos). Busque horarios y lugares de reunin en las pginas [place] locales. Document  Released: 06/06/2012 [facility] Patient Information 2014 [place], [place].  Dejar de fumar  (Smoking Cessation) Dejar de fumar es importante para su salud y tiene Product/process development scientist. Sin embargo, no siempre es Control and instrumentation engineer de fumar ya que la nicotina es una droga [place]. Generalmente las personas intentan 3 veces o ms antes de poder dejar de fumar. En este documento se explican las mejores formas de prepararse para dejar de fumar. Esta decisin requiere SP[employer] Corporation y mucho esfuerzo, pero usted puede [place].  [place]   Vivir ms, se sentir mejor y vivir mejor.  El cuerpo sentir el impacto de dejar de fumar de inmediato.  Luego de 20 minutos la presin arterial disminuye. El pulso vuelve a su nivel normal.  Despus de 8 horas, los niveles de monxido de carbono en la sangre vuelven a la normalidad. Aumenta el nivel de oxgeno.  Despus de 24 horas, la probabilidad de infarto comienza a disminuir. La respiracin, el cabello y el cuerpo ya no huelen a humo.  Luego de 48 horas, los nervios daados comienzan a recuperarse. Mejoran el sentido del gusto y Armed forces logistics/support/administrative officer.  Luego de 72 horas, el organismo est virtualmente libre de nicotina. Los conductos bronquiales se relajan, la respiracin se normaliza.  Despus de 2 a 12 semanas, los pulmones pueden contener ms aire. Se facilita la actividad fsica y mejora la respiracin.  El riesgo de sufrir un infarto, un ictus, cncer o enfermedad pulmonar disminuye en gran medida.  Despus de 1 ao, el riesgo de coronariopatas disminuye a la mitad.  Despus de 5 aos, el riesgo de ictus disminuye al nivel de un no fumador.  Despus de 10 aos, el riesgo de cncer de pulmn disminuye a la mitad, y el riesgo de sufrir otros tipos de cncer disminuye considerablemente.  Despus de 15 aos, el riesgo de enfermedad coronaria disminuye, generalmente al nivel de un no fumador.  Si est embarazada, al dejar de fumar aumentar las  probabilidades de tener un beb sano.  Las personas con las que convive, Nation[employer] nios, estarn ms saludables.  Tendr dinero extra para gastar en otras cosas que no sean cigarrillos. PREGUNTAS PARA PENSAR ANTES DE Allena Katz DEJAR DE FUMAR  Quizs desee hablar acerca de sus preguntas con el mdico.   Por qu desea dejar de fumar?  Cuando trat de dejar de fumar en el pasado, qu lo ayud y qu no lo ayud?  Cules sern las situaciones ms difciles para usted despus de dejar de fumar? Cmo planea manejarlas?  Quin puede ayudarlo en los momentos difciles? Su familia? Sus amigos? Un profesional?  Qu placeres obtiene cuando fuma? De qu manera puede seguir obteniendo placer si abandona el hbito? Estas son algunas preguntas para hacrselas al profesional.   Cmo puede ayudarme a dejar de fumar con xito?  Qu medicamento cree que sera el mejor para m, y cmo debo tomarlo?  Qu debo hacer si necesito ms ayuda?  Cmo es la desintoxicacin del cigarrillo? Cmo puedo obtener informacin acerca de la desintoxicacin? Preprese  Establezca una fecha para dejar de fumar.  Cambie su entorno, deshacindose de los cigarrillos, ceniceros, fsforos y encendedores en su casa, el auto o el Ridgetoptrabajo. No permita que nadie fume dentro de su casa.  Repase sus intentos anteriores. Piense en qu cosas funcionaron y cules no. BUSQUE AYUDA Y ESTMULO  Usted tiene mejores probabilidades de tener xito si cuenta con ayuda. Puede obtener apoyo de Viacommuchas maneras:   Dgale a sus familiares, amigos y compaeros de trabajo que usted dejar de fumar y que necesita su apoyo. Pdales que no fumen a su alrededor.  Obtenga consejo y apoyo individual, grupal o telefnico. Hay programas que se ofrecen en hospitales y centros mdicos locales. Comunquese con el departamento de salud de su localidad para obtener informacin acerca de los programas disponibles en su rea.  Las  creencias y prcticas espirituales pueden ayudar a los fumadores a abandonar el hbito.  Descargue en su computadora un programa que registre sus estadsticas, por ejemplo, cunto hace que no fuma, la cantidad de cigarrillos que no ha fumado y el dinero ahorrado.  Consiga un libro de Peruautoayuda sobre dejar de fumar y Manufacturing systems engineeralejarse del tabaco. Aprenda nuevas destrezas y conductas   Trate de entretenerse con otra cosa cuando sienta ganas de fumar. Hable con alguien, salga a caminar u ocpese en alguna tarea.  Cambie su rutina habitual. Blake DivineUtilice una ruta diferente para llegar al Aleen Campitrabajo. Beba t en vez de caf. Desayune en un lugar diferente.  Reduzca las situaciones de estrs. Tome un bao caliente, practique alguna actividad fsica o lea un libro.  Planee hacer cada da algo que disfrute. Recompnsese por no fumar.  Explore programas interactivos en la web dedicados a ayudar a dejar de fumar. CONSIGA MEDICAMENTOS Y SELOS CORRECTAMENTE  Algunos medicamentos pueden ayudar a dejar de fumar y Technical sales engineerdisminuir la necesidad de tabaco. Teacher, English as a foreign languageCombinar los medicamentos con las conductas y mtodos de apoyo ya mencionados puede aumentar en gran medida sus posibilidades de dejar de fumar con xito.   La terapia de reemplazo de nicotina enva nicotina al organismo sin los North Teresafortefectos negativos y los riesgos del fumar. La terapia de reemplazo de nicotina incluye chicles, pastillas, inhaladores nasales en aerosol y parches para la piel de nicotina. Algunos son de Secundino Gingerventa libre y otros requieren una receta mdica.  Los antidepresivos ayudan a las personas a Animal nutritionistdejar de Art therapistfumar, Biomedical engineerpero no se conoce cul es el mecanismo. Se venden bajo receta mdica.  Los Baker Hughes Incorporatedagonistas parciales de los receptores de nicotina simulan el efecto de la nicotina en el cerebro. Se venden bajo receta mdica. Pdale a su mdico que lo aconseje Apache Corporationsobre los medicamentos que debe Chemical engineerutilizar y cmo utilizarlos en base a su historia clnica. El mdico le dir qu efectos  secundarios deber Warehouse managertener en cuenta si decide utilizar un medicamento o seguir un tratamiento. Lea cuidadosamente la informacin en el envase. No utilice cualquier otro producto que contenga nicotina durante el uso de un producto de reemplazo de nicotina.  RECADA O SITUACIONES DIFCILES  La mayor parte de las recadas se producen dentro de los 3 primeros meses de abandonar el hbito. No  se desanime si comienza a fumar de nuevo. Recuerde, la Franklin Resourcesmayora de las personas tratan varias veces de dejar de fumar antes de lograrlo. Podr sufrir sndrome de abstinencia porque su cuerpo est acostumbrado a la nicotina. Podr sentir el deseo compulsivo de fumar, irritabilidad, enojo, tos, cefaleas y dificultad para concentrarse. Estos sntomas son transitorios. Son ms intensos en un comienzo, pero desaparecern en 10  a 14 das.  Para reducir las probabilidades de fracaso, trate de:   Evitar el consumo alcohol. El beber disminuye sus posibilidades de xito.  Disminuya el consumo de cafena. Una vez que deje de fumar, la cantidad de cafena en su organismo aumenta y puede darle sntomas, como frecuencia cardaca rpida, sudoracin y ansiedad.  Evite a las [employer] fuman porque pueden hacer que usted desee [place].  No deje que el aumento de peso distraiga su objetivo. Muchos fumadores aumentarn de peso cuando dejen de fumar, generalmente menos de 4,5 Kg. Consuma una dieta saludable y [place]. Siempre podr perder [employer] que se gane despus de dejar de fumar.  Encuentre formas de mejorar su estado de nimo que no sean fumando PARA OBTENER MS INFORMACIN  www.smokefree.gov  Document Released: 06/20/2005 Document Revised: 12/20/2011 [facility] Patient Information 2014 [place], [place].

## 2013-11-12 NOTE — ED Provider Notes (Signed)
CSN: [id number]     Arrival date & time 11/12/13  0104 History   First MD Initiated Contact with Patient 11/12/13 0335     Chief Complaint  Patient presents with  . Shortness of Breath     (Consider location/radiation/quality/duration/timing/severity/associated sxs/prior Treatment) Patient is a 58 y.o. male presenting with shortness of breath. The history is provided by the patient, a relative and the spouse.  Shortness of Breath  He complains of shortness of breath, cough, productive of yellow sputum; since yesterday. He has had similar in the past. He continues to smoke cigarettes despite having a diagnosis of COPD. He denies nausea, vomiting, weakness, or dizziness. There are no other known modifying factors  Past Medical History  Diagnosis Date  . COPD (chronic obstructive pulmonary disease)   . Emphysema/COPD    Past Surgical History  Procedure Laterality Date  . Foot surgery     History reviewed. No pertinent family history. History  Substance Use Topics  . Smoking status: Current Every Day Smoker -- 1.00 packs/day    Types: Cigarettes  . Smokeless tobacco: Never Used  . Alcohol Use: 3.6 oz/week    6 Cans of beer per week    Review of Systems  Respiratory: Positive for shortness of breath.       Allergies  Review of patient's allergies indicates no known allergies.  Home Medications   Prior to Admission medications   Medication Sig Start Date End Date Taking? Authorizing Provider  ibuprofen (ADVIL,MOTRIN) 200 MG tablet Take 200 mg by mouth every 6 (six) hours as needed (pain).   Yes Historical Provider, MD   BP 107/67  Pulse 98  Temp(Src) 98.2 F (36.8 C) (Oral)  Resp 22  SpO2 94% Physical Exam  Nursing note and vitals reviewed. Constitutional: He is oriented to person, place, and time. He appears well-developed and well-nourished.  HENT:  Head: Normocephalic and atraumatic.  Right Ear: External ear normal.  Left Ear: External ear normal.  Eyes:  Conjunctivae and EOM are normal. Pupils are equal, round, and reactive to light.  Neck: Normal range of motion and phonation normal. Neck supple.  Cardiovascular: Normal rate, regular rhythm, normal heart sounds and intact distal pulses.   Pulmonary/Chest: Effort normal. He has wheezes. He has no rales. He exhibits no bony tenderness.  Decreased air movement bilaterally  Abdominal: Soft. There is no tenderness.  Musculoskeletal: Normal range of motion.  Neurological: He is alert and oriented to person, place, and time. No cranial nerve deficit or sensory deficit. He exhibits normal muscle tone. Coordination normal.  Skin: Skin is warm, dry and intact.  Psychiatric: He has a normal mood and affect. His behavior is normal. Judgment and thought content normal.    ED Course  Procedures (including critical care time)  Medications  albuterol (PROVENTIL HFA;VENTOLIN HFA) 108 (90 BASE) MCG/ACT inhaler 2 puff (not administered)  aerochamber Z-Stat Plus/medium 1 each (not administered)  albuterol (PROVENTIL) (2.5 MG/3ML) 0.083% nebulizer solution 2.5 mg (2.5 mg Nebulization Given 11/12/13 0123)  ipratropium (ATROVENT) nebulizer solution 0.5 mg (0.5 mg Nebulization Given 11/12/13 0123)    Patient Vitals for the past 24 hrs:  BP Temp Temp src Pulse Resp SpO2  11/12/13 0107 107/67 mmHg 98.2 F (36.8 C) Oral 98 22 94 %       Labs Review Labs Reviewed - No data to display  Imaging Review Dg Chest 2 View  11/12/2013   CLINICAL DATA:  Cough and shortness of breath  EXAM: CHEST  2  VIEW  COMPARISON:  07/31/2013  FINDINGS: Cardiac shadow is stable. The lungs are hyperinflated bilaterally but stable. Interstitial changes are seen without focal infiltrate or sizable effusion. No acute bony abnormality is noted. Postoperative changes in the left shoulder are seen.  IMPRESSION: COPD without acute abnormality.   Electronically Signed   By: Inez Catalina M.D.   On: 11/12/2013 01:24     EKG  Interpretation None      MDM   Final diagnoses:  Acute bronchitis  COPD (chronic obstructive pulmonary disease)  Tobacco abuse    Recurrent bronchitis, with COPD. No evidence for pneumonia, or suspected metabolic instability.  Nursing Notes Reviewed/ Care Coordinated Applicable Imaging Reviewed Interpretation of Laboratory Data incorporated into ED treatment  The patient appears reasonably screened and/or stabilized for discharge and I doubt any other medical condition or other [facility] requiring further screening, evaluation, or treatment in the ED at this time prior to discharge.  Plan: Home Medications- prednisone, Zithromax; Home Treatments- stop smoke; return here if the recommended treatment, does not improve the symptoms; Recommended follow up- PCP, when necessary     Richarda Blade, MD 11/12/13 [phone]

## 2013-11-12 NOTE — ED Notes (Signed)
Pt complains of being short of breath since yesterday, pt also states he's been coughing

## 2014-10-03 DIAGNOSIS — E119 Type 2 diabetes mellitus without complications: Secondary | ICD-10-CM

## 2014-10-03 DIAGNOSIS — J439 Emphysema, unspecified: Secondary | ICD-10-CM

## 2014-10-03 HISTORY — DX: Emphysema, unspecified: J43.9

## 2014-10-03 HISTORY — DX: Type 2 diabetes mellitus without complications: E11.9

## 2014-10-14 ENCOUNTER — Inpatient Hospital Stay (HOSPITAL_COMMUNITY)
Admission: EM | Admit: 2014-10-14 | Discharge: 2014-10-15 | DRG: 638 | Disposition: A | Discharge status: Home / Self Care | Payer: Self-pay | Attending: Internal Medicine | Admitting: Internal Medicine

## 2014-10-14 ENCOUNTER — Other Ambulatory Visit (HOSPITAL_COMMUNITY): Payer: Self-pay

## 2014-10-14 ENCOUNTER — Emergency Department (HOSPITAL_COMMUNITY): Payer: Self-pay

## 2014-10-14 ENCOUNTER — Encounter (HOSPITAL_COMMUNITY): Payer: Self-pay | Admitting: Emergency Medicine

## 2014-10-14 ENCOUNTER — Other Ambulatory Visit: Payer: Self-pay

## 2014-10-14 DIAGNOSIS — E785 Hyperlipidemia, unspecified: Secondary | ICD-10-CM

## 2014-10-14 DIAGNOSIS — J449 Chronic obstructive pulmonary disease, unspecified: Secondary | ICD-10-CM

## 2014-10-14 DIAGNOSIS — E875 Hyperkalemia: Secondary | ICD-10-CM | POA: Diagnosis present

## 2014-10-14 DIAGNOSIS — Z23 Encounter for immunization: Secondary | ICD-10-CM

## 2014-10-14 DIAGNOSIS — F17210 Nicotine dependence, cigarettes, uncomplicated: Secondary | ICD-10-CM | POA: Diagnosis present

## 2014-10-14 DIAGNOSIS — E1165 Type 2 diabetes mellitus with hyperglycemia: Principal | ICD-10-CM | POA: Diagnosis present

## 2014-10-14 DIAGNOSIS — D72829 Elevated white blood cell count, unspecified: Secondary | ICD-10-CM | POA: Insufficient documentation

## 2014-10-14 DIAGNOSIS — R739 Hyperglycemia, unspecified: Secondary | ICD-10-CM | POA: Insufficient documentation

## 2014-10-14 DIAGNOSIS — E119 Type 2 diabetes mellitus without complications: Secondary | ICD-10-CM

## 2014-10-14 DIAGNOSIS — E1169 Type 2 diabetes mellitus with other specified complication: Secondary | ICD-10-CM

## 2014-10-14 DIAGNOSIS — E871 Hypo-osmolality and hyponatremia: Secondary | ICD-10-CM | POA: Diagnosis present

## 2014-10-14 LAB — CBG MONITORING, ED
GLUCOSE-CAPILLARY: 516 mg/dL — AB (ref 70–99)
Glucose-Capillary: >600 mg/dL (ref 70–99)
Glucose-Capillary: 593 mg/dL (ref 70–99)
Glucose-Capillary: 561 mg/dL (ref 70–99)

## 2014-10-14 LAB — CBC
HCT: 41 % (ref 39.0–52.0)
HEMATOCRIT: 45.9 % (ref 39.0–52.0)
HEMOGLOBIN: 14.4 g/dL (ref 13.0–17.0)
Hemoglobin: 15.8 g/dL (ref 13.0–17.0)
MCH: 33.9 pg (ref 26.0–34.0)
MCH: 33.6 pg (ref 26.0–34.0)
MCHC: 34.4 g/dL (ref 30.0–36.0)
MCHC: 35.1 g/dL (ref 30.0–36.0)
MCV: 98.5 fL (ref 78.0–100.0)
MCV: 95.6 fL (ref 78.0–100.0)
PLATELETS: 247 10*3/uL (ref 150–400)
PLATELETS: 205 10*3/uL (ref 150–400)
RBC: 4.66 MIL/uL (ref 4.22–5.81)
RBC: 4.29 MIL/uL (ref 4.22–5.81)
RDW: 12.4 % (ref 11.5–15.5)
RDW: 12.2 % (ref 11.5–15.5)
WBC: 14.5 10*3/uL — AB (ref 4.0–10.5)
WBC: 11 10*3/uL — AB (ref 4.0–10.5)

## 2014-10-14 LAB — BASIC METABOLIC PANEL
Anion gap: 9 (ref 5–15)
BUN: 27 mg/dL — ABNORMAL HIGH (ref 6–23)
CALCIUM: 8.4 mg/dL (ref 8.4–10.5)
CHLORIDE: 98 mmol/L (ref 96–112)
CO2: 24 mmol/L (ref 19–32)
Creatinine, Ser: 1.01 mg/dL (ref 0.50–1.35)
GFR calc Af Amer: >90 mL/min (ref >90)
GFR calc non Af Amer: 79 mL/min — ABNORMAL LOW (ref >90)
Glucose, Bld: 522 mg/dL — ABNORMAL HIGH (ref 70–99)
Potassium: 4.4 mmol/L (ref 3.5–5.1)
Sodium: 131 mmol/L — ABNORMAL LOW (ref 135–145)

## 2014-10-14 LAB — MAGNESIUM: Magnesium: 2.2 mg/dL (ref 1.5–2.5)

## 2014-10-14 LAB — COMPREHENSIVE METABOLIC PANEL
ALBUMIN: 3.8 g/dL (ref 3.5–5.2)
ALK PHOS: 82 U/L (ref 39–117)
ALT: 28 U/L (ref 0–53)
AST: 29 U/L (ref 0–37)
Anion gap: 14 (ref 5–15)
BILIRUBIN TOTAL: 1.2 mg/dL (ref 0.3–1.2)
BUN: 29 mg/dL — ABNORMAL HIGH (ref 6–23)
CO2: 25 mmol/L (ref 19–32)
Calcium: 8.9 mg/dL (ref 8.4–10.5)
Chloride: 85 mmol/L — ABNORMAL LOW (ref 96–112)
Creatinine, Ser: 1.33 mg/dL (ref 0.50–1.35)
GFR calc Af Amer: 66 mL/min — ABNORMAL LOW (ref >90)
GFR calc non Af Amer: 57 mL/min — ABNORMAL LOW (ref >90)
GLUCOSE: 854 mg/dL — AB (ref 70–99)
POTASSIUM: 6 mmol/L — AB (ref 3.5–5.1)
Sodium: 124 mmol/L — ABNORMAL LOW (ref 135–145)
Total Protein: 7.3 g/dL (ref 6.0–8.3)

## 2014-10-14 LAB — BETA-HYDROXYBUTYRIC ACID: Beta-Hydroxybutyric Acid: 0.34 mmol/L — ABNORMAL HIGH (ref 0.05–0.27)

## 2014-10-14 LAB — URINALYSIS, ROUTINE W REFLEX MICROSCOPIC
BILIRUBIN URINE: NEGATIVE
HGB URINE DIPSTICK: NEGATIVE
Ketones, ur: NEGATIVE mg/dL
LEUKOCYTES UA: NEGATIVE
Nitrite: NEGATIVE
Protein, ur: NEGATIVE mg/dL
SPECIFIC GRAVITY, URINE: 1.025 (ref 1.005–1.030)
Urobilinogen, UA: 0.2 mg/dL (ref 0.0–1.0)
pH: 6 (ref 5.0–8.0)

## 2014-10-14 LAB — PHOSPHORUS: Phosphorus: 3.2 mg/dL (ref 2.3–4.6)

## 2014-10-14 LAB — URINE MICROSCOPIC-ADD ON

## 2014-10-14 MED ORDER — DEXTROSE-NACL 5-0.45 % IV SOLN
INTRAVENOUS | Status: DC
  Administered 2014-10-15: 03:00:00 via INTRAVENOUS

## 2014-10-14 MED ORDER — PNEUMOCOCCAL VAC POLYVALENT 25 MCG/0.5ML IJ INJ
0.5000 mL | INJECTION | INTRAMUSCULAR | Status: AC
  Administered 2014-10-15: 0.5 mL via INTRAMUSCULAR
  Filled 2014-10-14 (×2): qty 0.5

## 2014-10-14 MED ORDER — SODIUM CHLORIDE 0.9 % IV SOLN
INTRAVENOUS | Status: DC
  Administered 2014-10-14: 23:00:00 via INTRAVENOUS

## 2014-10-14 MED ORDER — SODIUM CHLORIDE 0.9 % IV SOLN
1000.0000 mL | Freq: Once | INTRAVENOUS | Status: AC
  Administered 2014-10-14: 1000 mL via INTRAVENOUS

## 2014-10-14 MED ORDER — SODIUM CHLORIDE 0.9 % IV SOLN
1000.0000 mL | INTRAVENOUS | Status: DC
  Administered 2014-10-14: 1000 mL via INTRAVENOUS

## 2014-10-14 MED ORDER — SODIUM CHLORIDE 0.9 % IV SOLN
INTRAVENOUS | Status: DC
  Administered 2014-10-14: 5.3 [IU]/h via INTRAVENOUS
  Filled 2014-10-14: qty 2.5

## 2014-10-14 MED ORDER — MOMETASONE FURO-FORMOTEROL FUM 100-5 MCG/ACT IN AERO
2.0000 | INHALATION_SPRAY | Freq: Two times a day (BID) | RESPIRATORY_TRACT | Status: DC
  Filled 2014-10-14 (×2): qty 8.8

## 2014-10-14 MED ORDER — DEXTROSE-NACL 5-0.45 % IV SOLN: INTRAVENOUS | Status: DC

## 2014-10-14 MED ORDER — POTASSIUM CHLORIDE 10 MEQ/100ML IV SOLN
10.0000 meq | INTRAVENOUS | Status: DC
  Administered 2014-10-14: 10 meq via INTRAVENOUS
  Filled 2014-10-14: qty 100

## 2014-10-14 MED ORDER — SODIUM CHLORIDE 0.9 % IV SOLN: INTRAVENOUS | Status: DC

## 2014-10-14 MED ORDER — NICOTINE 21 MG/24HR TD PT24
21.0000 mg | MEDICATED_PATCH | Freq: Every day | TRANSDERMAL | Status: DC
  Administered 2014-10-15: 21 mg via TRANSDERMAL
  Filled 2014-10-14: qty 1

## 2014-10-14 MED ORDER — IPRATROPIUM-ALBUTEROL 0.5-2.5 (3) MG/3ML IN SOLN
3.0000 mL | Freq: Four times a day (QID) | RESPIRATORY_TRACT | Status: DC
  Administered 2014-10-15: 3 mL via RESPIRATORY_TRACT
  Filled 2014-10-14: qty 3

## 2014-10-14 MED ORDER — SODIUM CHLORIDE 0.9 % IV SOLN
INTRAVENOUS | Status: DC
  Administered 2014-10-14: 4.6 [IU]/h via INTRAVENOUS
  Filled 2014-10-14: qty 2.5

## 2014-10-14 MED ORDER — HEPARIN SODIUM (PORCINE) 5000 UNIT/ML IJ SOLN
5000.0000 [IU] | Freq: Three times a day (TID) | INTRAMUSCULAR | Status: DC
  Administered 2014-10-14 – 2014-10-15 (×2): 5000 [IU] via SUBCUTANEOUS
  Filled 2014-10-14 (×4): qty 1

## 2014-10-14 MED ORDER — SODIUM CHLORIDE 0.9 % IV BOLUS (SEPSIS)
1000.0000 mL | Freq: Once | INTRAVENOUS | Status: AC
Start: 2014-10-14 — End: 2014-10-14
  Administered 2014-10-14: 1000 mL via INTRAVENOUS

## 2014-10-14 NOTE — ED Provider Notes (Addendum)
CSN: [id number]     Arrival date & time 10/14/14  1712 History   First MD Initiated Contact with Patient 10/14/14 1914     Chief Complaint  Patient presents with  . Hyperglycemia     (Consider location/radiation/quality/duration/timing/severity/associated sxs/prior Treatment) Patient is a 59 y.o. male presenting with hyperglycemia. The history is provided by the patient.  Hyperglycemia Associated symptoms: increased thirst, polyuria and shortness of breath   Associated symptoms: no abdominal pain, no chest pain, no nausea, no vomiting and no weakness    patient presents with hyperglycemia. Sugar checked at home and was greater than 600. The last 2 weeks had urinary frequency he states he has to urinate every 10 minutes. States he gets up at night 4. He's been checking his sugar with his wife's meter and has been 400 500. He has had a cough. He is not previously known to be diabetic. States he is on 10 mg of prednisone for his COPD. No fevers. No rash.  Past Medical History  Diagnosis Date  . COPD (chronic obstructive pulmonary disease)   . Emphysema/COPD    Past Surgical History  Procedure Laterality Date  . Foot surgery     History reviewed. No pertinent family history. History  Substance Use Topics  . Smoking status: Current Every Day Smoker -- 1.00 packs/day    Types: Cigarettes  . Smokeless tobacco: Never Used  . Alcohol Use: 3.6 oz/week    6 Cans of beer per week    Review of Systems  Constitutional: Negative for activity change and appetite change.  Eyes: Negative for pain.  Respiratory: Positive for cough and shortness of breath. Negative for chest tightness.   Cardiovascular: Negative for chest pain and leg swelling.  Gastrointestinal: Negative for nausea, vomiting, abdominal pain and diarrhea.  Endocrine: Positive for polydipsia, polyphagia and polyuria.  Genitourinary: Positive for frequency. Negative for flank pain.  Musculoskeletal: Negative for back pain and  neck stiffness.  Skin: Negative for rash.  Neurological: Negative for weakness, numbness and headaches.  Psychiatric/Behavioral: Negative for behavioral problems.      Allergies  Review of patient's allergies indicates no known allergies.  Home Medications   Prior to Admission medications   Medication Sig Start Date End Date Taking? Authorizing Provider  metFORMIN (GLUCOPHAGE) 500 MG tablet Take 1 tablet (500 mg total) by mouth 2 (two) times daily with a meal. 10/15/14   Robbie Lis, MD   BP 126/88 mmHg  Pulse 61  Temp(Src) 97.3 F (36.3 C) (Axillary)  Resp 20  Ht 5\' 9"  (1.753 m)  Wt 150 lb 12.7 oz (68.4 kg)  BMI 22.26 kg/m2  SpO2 95% Physical Exam  Constitutional: He is oriented to person, place, and time. He appears well-developed and well-nourished.  HENT:  Head: Normocephalic and atraumatic.  Mucous membranes are dry  Eyes: Pupils are equal, round, and reactive to light.  Cardiovascular: Normal rate, regular rhythm and normal heart sounds.   No murmur heard. Pulmonary/Chest: Effort normal. He has wheezes.  Few scattered wheezes  Abdominal: Soft. Bowel sounds are normal. There is no tenderness.  Musculoskeletal: Normal range of motion. He exhibits no edema.  Neurological: He is alert and oriented to person, place, and time. No cranial nerve deficit.  Skin: Skin is warm and dry.  Psychiatric: He has a normal mood and affect.  Nursing note and vitals reviewed.   ED Course  Procedures (including critical care time) Labs Review Labs Reviewed  CBC - Abnormal; Notable for the following:  WBC 14.5 (*)    All other components within normal limits  COMPREHENSIVE METABOLIC PANEL - Abnormal; Notable for the following:    Sodium 124 (*)    Potassium 6.0 (*)    Chloride 85 (*)    Glucose, Bld 854 (*)    BUN 29 (*)    GFR calc non Af Amer 57 (*)    GFR calc Af Amer 66 (*)    All other components within normal limits  URINALYSIS, ROUTINE W REFLEX MICROSCOPIC -  Abnormal; Notable for the following:    Glucose, UA >1000 (*)    All other components within normal limits  BASIC METABOLIC PANEL - Abnormal; Notable for the following:    Sodium 131 (*)    Glucose, Bld 522 (*)    BUN 27 (*)    GFR calc non Af Amer 79 (*)    All other components within normal limits  BASIC METABOLIC PANEL - Abnormal; Notable for the following:    Sodium 134 (*)    Glucose, Bld 182 (*)    Calcium 8.3 (*)    All other components within normal limits  BASIC METABOLIC PANEL - Abnormal; Notable for the following:    Calcium 8.2 (*)    All other components within normal limits  CBC - Abnormal; Notable for the following:    WBC 11.0 (*)    All other components within normal limits  BETA-HYDROXYBUTYRIC ACID - Abnormal; Notable for the following:    Beta-Hydroxybutyric Acid 0.34 (*)    All other components within normal limits  GLUCOSE, CAPILLARY - Abnormal; Notable for the following:    Glucose-Capillary 389 (*)    All other components within normal limits  GLUCOSE, CAPILLARY - Abnormal; Notable for the following:    Glucose-Capillary 332 (*)    All other components within normal limits  GLUCOSE, CAPILLARY - Abnormal; Notable for the following:    Glucose-Capillary 265 (*)    All other components within normal limits  GLUCOSE, CAPILLARY - Abnormal; Notable for the following:    Glucose-Capillary 171 (*)    All other components within normal limits  GLUCOSE, CAPILLARY - Abnormal; Notable for the following:    Glucose-Capillary 116 (*)    All other components within normal limits  GLUCOSE, CAPILLARY - Abnormal; Notable for the following:    Glucose-Capillary 105 (*)    All other components within normal limits  GLUCOSE, CAPILLARY - Abnormal; Notable for the following:    Glucose-Capillary 275 (*)    All other components within normal limits  CBG MONITORING, ED - Abnormal; Notable for the following:    Glucose-Capillary >600 (*)    All other components within  normal limits  CBG MONITORING, ED - Abnormal; Notable for the following:    Glucose-Capillary 593 (*)    All other components within normal limits  CBG MONITORING, ED - Abnormal; Notable for the following:    Glucose-Capillary 561 (*)    All other components within normal limits  CBG MONITORING, ED - Abnormal; Notable for the following:    Glucose-Capillary 516 (*)    All other components within normal limits  MRSA PCR SCREENING  CULTURE, BLOOD (ROUTINE X 2)  CULTURE, BLOOD (ROUTINE X 2)  URINE MICROSCOPIC-ADD ON  MAGNESIUM  PHOSPHORUS  GLUCOSE, CAPILLARY  GLUCOSE, CAPILLARY  HEMOGLOBIN A1C    Imaging Review Dg Chest 2 View  10/14/2014   CLINICAL DATA:  Cough, congestion, hemoptysis, fever, cough, shortness of breath for 2 weeks.  EXAM:  CHEST  2 VIEW  COMPARISON:  11/12/2013  FINDINGS: The lungs are hyperinflated with coarsened interstitial markings consistent with emphysema. The cardiomediastinal contours are normal. Pulmonary vasculature is normal. No consolidation, pleural effusion, or pneumothorax. No acute osseous abnormalities are seen.  IMPRESSION: Emphysema without superimposed acute process.   Electronically Signed   By: Jeb Levering M.D.   On: 10/14/2014 20:52     EKG Interpretation None     ED ECG REPORT   Date: 10/14/2014  Rate: 85  Rhythm: normal sinus rhythm  QRS Axis: normal  Intervals: PR shortened  ST/T Wave abnormalities: normal  Conduction Disutrbances:none  Narrative Interpretation:  T waves somewhat prominent, but stable.  Old EKG Reviewed: unchanged   MDM   Final diagnoses:  New onset type 2 diabetes mellitus  Hyperglycemia  Hyperkalemia    Patient with new onset hyperglycemia and diabetes. Not in DKA. Hyperkalemia good renal function. Will admit to internal medicine. Started on glucose drip and IV fluids given.    Davonna Belling, MD 10/15/14 [place], MD 10/23/14 [phone]

## 2014-10-14 NOTE — ED Notes (Signed)
Provided patient a water per permission from provider.

## 2014-10-14 NOTE — ED Notes (Signed)
CBG IN TRIAGE ABOVE 600

## 2014-10-14 NOTE — H&P (Signed)
[facility] History and Physical  Duane Cuevas RWE:[id number] DOB: [birth year] DOA: 10/14/2014  Referring physician: Davonna Belling PCP: No primary care provider on file.   Chief Complaint: Diabetes New Onset  HPI: Duane Cuevas is a 59 y.o. male presents with increased thirst and polyuria. Patients wife has a blood glucose monitor and his sugar was checked to be about 500. Patient states this has been going on for about a week. Patient has no fevers or chills noted. He has no prior history of diabetes. He was on steroids for his COPD he states at least 2 months. Patient has no inhalers. He states that he has had no hemoptysis. He has a cough noted for a few weeks. He has had increased sputum production also. He states that he does have a history of COPD. Patient does still smoke.    Review of Systems:  Constitutional:  +weight loss, no night sweats, Fevers, chills, fatigue.  HEENT:  No headaches, No sneezing, itching, ear ache, nasal congestion, post nasal drip,  Cardio-vascular:  No chest pain, Orthopnea, PND, swelling in lower extremities  GI:  No heartburn, indigestion, abdominal pain, nausea, vomiting, diarrhea  Resp:  No shortness of breath with exertion or at rest. No excess mucus, ++productive cough no coughing up of blood.No change in color of mucus Skin:  no rash or lesions.  GU:  no dysuria, change in color of urine, no urgency ++increased frequency  Musculoskeletal:  No joint pain or swelling. No decreased range of motion Psych:  No change in mood or affect. No depression or anxiety   Past Medical History  Diagnosis Date  . COPD (chronic obstructive pulmonary disease)   . Emphysema/COPD    Past Surgical History  Procedure Laterality Date  . Foot surgery     Social History:  reports that he has been smoking Cigarettes.  He has been smoking about 1.00 pack per day. He has never used smokeless tobacco. He reports that he drinks about 3.6 oz of  alcohol per week. He reports that he does not use illicit drugs.  No Known Allergies  History reviewed. No pertinent family history.   Prior to Admission medications   Medication Sig Start Date End Date Taking? Authorizing Provider  azithromycin (ZITHROMAX Z-PAK) 250 MG tablet 2 po day one, then 1 daily x 4 days Patient not taking: Reported on 10/14/2014 11/12/13   Daleen Bo, MD  predniSONE (DELTASONE) 20 MG tablet Take 1 tablet (20 mg total) by mouth 2 (two) times daily. Patient not taking: Reported on 10/14/2014 11/12/13   Daleen Bo, MD   Physical Exam: Filed Vitals:   10/14/14 1716 10/14/14 1941  BP: 123/77 127/74  Pulse: 93 88  Temp: 98 F (36.7 C) 98.2 F (36.8 C)  TempSrc: Oral Oral  Resp: 18 20  SpO2: 97% 97%    Wt Readings from Last 3 Encounters:  No data found for Wt    General:  Appears calm and comfortable Eyes: PERRL, normal lids, irises & conjunctiva ENT: grossly normal hearing, lips & tongue Neck: no LAD, masses or thyromegaly Cardiovascular: RRR, no m/r/g. No LE edema. Respiratory: CTA bilaterally, no w/r/r. Normal respiratory effort. Abdomen: soft, ntnd Skin: no rash or induration seen on limited exam Musculoskeletal: grossly normal tone BUE/BLE Psychiatric: grossly normal mood and affect, speech fluent and appropriate Neurologic: grossly non-focal.          Labs on Admission:  Basic Metabolic Panel:  Recent Labs Lab 10/14/14 1821  NA 124*  K  6.0*  CL 85*  CO2 25  GLUCOSE 854*  BUN 29*  CREATININE 1.33  CALCIUM 8.9   Liver Function Tests:  Recent Labs Lab 10/14/14 1821  AST 29  ALT 28  ALKPHOS 82  BILITOT 1.2  PROT 7.3  ALBUMIN 3.8   No results for input(s): LIPASE, AMYLASE in the last 168 hours. No results for input(s): AMMONIA in the last 168 hours. CBC:  Recent Labs Lab 10/14/14 1821  WBC 14.5*  HGB 15.8  HCT 45.9  MCV 98.5  PLT 247   Cardiac Enzymes: No results for input(s): CKTOTAL, CKMB, CKMBINDEX,  TROPONINI in the last 168 hours.  BNP (last 3 results) No results for input(s): BNP in the last 8760 hours.  ProBNP (last 3 results) No results for input(s): PROBNP in the last 8760 hours.  CBG:  Recent Labs Lab 10/14/14 1722 10/14/14 2017  GLUCAP >600* 593*    Radiological Exams on Admission: No results found.    Assessment/Plan Principal Problem:   New onset type 2 diabetes mellitus Active Problems:   COPD (chronic obstructive pulmonary disease)   1. New Onset Diabetes with Hyperglycemia -will admit to stepdown -will start on glucose stabilizer -will start on IVF now -will get cultures also -check A1C  2. COPD -smoking cessation encouraged -will start on dulera -duonebs as needed  3. Hyponatremia -due to hyperglycemia -will hydrate and monitor labs  4. Hyperkalemia --due to hyperglycemia -will repeat labs   Code Status: Full Code (must indicate code status--if unknown or must be presumed, indicate so) DVT Prophylaxis:Heparin Family Communication: None (indicate person spoken with, if applicable, with phone number if by telephone) Disposition Plan: Home (indicate anticipated LOS)  Time spent: 49min  KHAN,SAADAT A [facility] Pager [phone]

## 2014-10-14 NOTE — Progress Notes (Signed)
EDCM spoke to patient at bedside. Patient's daughter at bedside to help with translation.  Patient is Spanish but understands and speaks little [place]. Patient confirms he does not have a pcp or insurance living in [place].  EDCM provide patient with pamphlet to [facility], informed patient of services there and walk in times.  EDCM also provided patient with list of pcps who accept self pay patients, list of discount pharmacies and websites needymeds.org and GoodRX.com for medication assistance, phone number to inquire about the orange card, phone number to inquire about [facility], phone number to inquire about the [place], financial resources in the community such as local churches, [employer], urban ministries, and dental assistance for uninsured patients.  Patient thankful for resources.  No further EDCM needs at this time.

## 2014-10-14 NOTE — ED Notes (Signed)
Pt denies pain but reports urinary frequency and thirst.

## 2014-10-14 NOTE — ED Notes (Signed)
Pt says he's been really thirsty over the last week, and tired. Says his sugar has been running high, "400s, and 500s" for the last week. Pt says he's not a diabetic, but his wife is. She recognized the symptoms, and has been checking his sugar. After the monitor read "High" today, they came here. CBG reads above 600 in triage.

## 2014-10-15 DIAGNOSIS — D72829 Elevated white blood cell count, unspecified: Secondary | ICD-10-CM | POA: Insufficient documentation

## 2014-10-15 DIAGNOSIS — R739 Hyperglycemia, unspecified: Secondary | ICD-10-CM | POA: Insufficient documentation

## 2014-10-15 LAB — BASIC METABOLIC PANEL
Anion gap: 6 (ref 5–15)
Anion gap: 6 (ref 5–15)
BUN: 20 mg/dL (ref 6–23)
BUN: 19 mg/dL (ref 6–23)
CALCIUM: 8.3 mg/dL — AB (ref 8.4–10.5)
CHLORIDE: 103 mmol/L (ref 96–112)
CO2: 25 mmol/L (ref 19–32)
CO2: 28 mmol/L (ref 19–32)
CREATININE: 0.79 mg/dL (ref 0.50–1.35)
CREATININE: 0.79 mg/dL (ref 0.50–1.35)
Calcium: 8.2 mg/dL — ABNORMAL LOW (ref 8.4–10.5)
Chloride: 103 mmol/L (ref 96–112)
GFR calc Af Amer: >90 mL/min (ref >90)
GFR calc non Af Amer: >90 mL/min (ref >90)
GFR calc non Af Amer: >90 mL/min (ref >90)
GLUCOSE: 97 mg/dL (ref 70–99)
Glucose, Bld: 182 mg/dL — ABNORMAL HIGH (ref 70–99)
POTASSIUM: 3.7 mmol/L (ref 3.5–5.1)
Potassium: 3.7 mmol/L (ref 3.5–5.1)
Sodium: 134 mmol/L — ABNORMAL LOW (ref 135–145)
Sodium: 137 mmol/L (ref 135–145)

## 2014-10-15 LAB — GLUCOSE, CAPILLARY
GLUCOSE-CAPILLARY: 389 mg/dL — AB (ref 70–99)
GLUCOSE-CAPILLARY: 265 mg/dL — AB (ref 70–99)
GLUCOSE-CAPILLARY: 116 mg/dL — AB (ref 70–99)
GLUCOSE-CAPILLARY: 99 mg/dL (ref 70–99)
GLUCOSE-CAPILLARY: 80 mg/dL (ref 70–99)
GLUCOSE-CAPILLARY: 275 mg/dL — AB (ref 70–99)
Glucose-Capillary: 332 mg/dL — ABNORMAL HIGH (ref 70–99)
Glucose-Capillary: 171 mg/dL — ABNORMAL HIGH (ref 70–99)
Glucose-Capillary: 105 mg/dL — ABNORMAL HIGH (ref 70–99)
Glucose-Capillary: 127 mg/dL — ABNORMAL HIGH (ref 70–99)

## 2014-10-15 LAB — MRSA PCR SCREENING: MRSA by PCR: NEGATIVE

## 2014-10-15 MED ORDER — INSULIN GLARGINE 100 UNIT/ML ~~LOC~~ SOLN
10.0000 [IU] | Freq: Every day | SUBCUTANEOUS | Status: DC
  Administered 2014-10-15: 10 [IU] via SUBCUTANEOUS
  Filled 2014-10-15 (×3): qty 0.1

## 2014-10-15 MED ORDER — LIVING WELL WITH DIABETES BOOK - IN SPANISH
Freq: Once | Status: AC
  Administered 2014-10-15: 09:00:00
  Filled 2014-10-15: qty 1

## 2014-10-15 MED ORDER — INSULIN ASPART 100 UNIT/ML ~~LOC~~ SOLN
0.0000 [IU] | Freq: Three times a day (TID) | SUBCUTANEOUS | Status: DC
  Administered 2014-10-15: 5 [IU] via SUBCUTANEOUS

## 2014-10-15 MED ORDER — IPRATROPIUM-ALBUTEROL 0.5-2.5 (3) MG/3ML IN SOLN
3.0000 mL | Freq: Two times a day (BID) | RESPIRATORY_TRACT | Status: DC
  Administered 2014-10-15: 3 mL via RESPIRATORY_TRACT
  Filled 2014-10-15: qty 3

## 2014-10-15 MED ORDER — METFORMIN HCL 500 MG PO TABS: 500.0000 mg | ORAL_TABLET | Freq: Two times a day (BID) | ORAL | Status: DC

## 2014-10-15 MED ORDER — HYDROCOD POLST-CHLORPHEN POLST 10-8 MG/5ML PO LQCR
ORAL | Status: AC
  Administered 2014-10-15: 02:00:00
  Filled 2014-10-15: qty 5

## 2014-10-15 MED ORDER — IPRATROPIUM-ALBUTEROL 0.5-2.5 (3) MG/3ML IN SOLN: 3.0000 mL | RESPIRATORY_TRACT | Status: DC | PRN

## 2014-10-15 NOTE — Progress Notes (Signed)
Pt assessed by respiratory care via protocol assessment.  Pt scored a 4, which is a severity of 1.  New Pt orders are DuoNeb BID, DuoNeb q4h PRN, and dulera BID.

## 2014-10-15 NOTE — Discharge Summary (Signed)
Physician Discharge Summary  Duane Cuevas ZGY:[id number] DOB: 30-Oct-1955 DOA: 10/14/2014  PCP: [facility] (Dr. Doreene Burke or Dr. Annitta Needs). [facility] if medical issues arise.   Admit date: 10/14/2014 Discharge date: 10/15/2014  Recommendations for Outpatient Follow-up:  1. Please take metformin on discharge as prescribed. 2. Please follow-up with primary care physician per scheduled appointment.  Discharge Diagnoses:  Principal Problem:   New onset type 2 diabetes mellitus Active Problems:   COPD (chronic obstructive pulmonary disease)    Discharge Condition: stable   Diet recommendation: as tolerated   History of present illness:  59 y.o. male with past medical history of COPD, was on short course of prednisone for COPD about 2 months ago. Patient presented [facility] ED with worsening symptoms of increasing thirst and frequent urination over past week or so prior to this admission. He took his wife's glucose monitor and it read about 500. Patient reported no blurred vision, no weight change. No fevers, no chills. No cough or chest pain or shortness of breath or palpitations.   Hospital Course:   Principal Problem:   New onset type 2 diabetes mellitus - A1c 11.1 on this admission We had diabetic coordinator see the pt in consultation - Pt has very limited financial resources and cant afford expensive meds. He reported if he can be on cheaper meds and he asked about metformin. I think considering new diabetes we can definitely try metformin for now. - He will follow up with [facility] per sch appt. - Script for metformin provided.   Active Problems:   COPD (chronic obstructive pulmonary disease) - Stable.   Signed:  Leisa Lenz, MD  [facility] 10/15/2014, 10:36 AM  Pager #: [phone]  Procedures:  None   Consultations:  Diabetic coordinator   Discharge Exam: Filed Vitals:   10/15/14 0800  BP: 119/84   Pulse: 63  Temp: 97.8 F (36.6 C)  Resp: 18   Filed Vitals:   10/15/14 0300 10/15/14 0400 10/15/14 0800 10/15/14 0856  BP: 119/77 105/76 119/84   Pulse: 73 62 63   Temp:  97.7 F (36.5 C) 97.8 F (36.6 C)   TempSrc:  Oral Oral   Resp: 20 18 18    Height:      Weight:      SpO2: 95% 92% 96% 93%    General: Pt is alert, follows commands appropriately, not in acute distress Cardiovascular: Regular rate and rhythm, S1/S2 +, no murmurs Respiratory: Clear to auscultation bilaterally, no wheezing, no crackles, no rhonchi Abdominal: Soft, non tender, non distended, bowel sounds +, no guarding Extremities: no edema, no cyanosis, pulses palpable bilaterally DP and PT Neuro: Grossly nonfocal  Discharge Instructions  Discharge Instructions    Call MD for:  difficulty breathing, headache or visual disturbances    Complete by:  As directed      Call MD for:  persistant nausea and vomiting    Complete by:  As directed      Call MD for:  severe uncontrolled pain    Complete by:  As directed      Diet - low sodium heart healthy    Complete by:  As directed      Discharge instructions    Complete by:  As directed   1. Please take metformin on discharge as prescribed. 2. Please follow-up with primary care physician per scheduled appointment.     Increase activity slowly    Complete  by:  As directed             Medication List    STOP taking these medications        azithromycin 250 MG tablet  Commonly known as:  ZITHROMAX Z-PAK     predniSONE 20 MG tablet  Commonly known as:  DELTASONE      TAKE these medications        metFORMIN 500 MG tablet  Commonly known as:  GLUCOPHAGE  Take 1 tablet (500 mg total) by mouth 2 (two) times daily with a meal.            Follow-up Information    Follow up with [facility]    . Schedule an appointment as soon as possible for a visit in 1 week.   Why:  Follow up appt after recent hospitalization    Contact information:   [address] [place] [address] [phone]       The results of significant diagnostics from this hospitalization (including imaging, microbiology, ancillary and laboratory) are listed below for reference.    Significant Diagnostic Studies: Dg Chest 2 View  10/14/2014   CLINICAL DATA:  Cough, congestion, hemoptysis, fever, cough, shortness of breath for 2 weeks.  EXAM: CHEST  2 VIEW  COMPARISON:  11/12/2013  FINDINGS: The lungs are hyperinflated with coarsened interstitial markings consistent with emphysema. The cardiomediastinal contours are normal. Pulmonary vasculature is normal. No consolidation, pleural effusion, or pneumothorax. No acute osseous abnormalities are seen.  IMPRESSION: Emphysema without superimposed acute process.   Electronically Signed   By: Jeb Levering M.D.   On: 10/14/2014 20:52    Microbiology: Recent Results (from the past 240 hour(s))  MRSA PCR Screening     Status: None   Collection Time: 10/14/14 11:01 PM  Result Value Ref Range Status   MRSA by PCR NEGATIVE NEGATIVE Final    Comment:        The GeneXpert MRSA Assay (FDA approved for NASAL specimens only), is one component of a comprehensive MRSA colonization surveillance program. It is not intended to diagnose MRSA infection nor to guide or monitor treatment for MRSA infections.      Labs: Basic Metabolic Panel:  Recent Labs Lab 10/14/14 1821 10/14/14 2228 10/15/14 0220 10/15/14 0612  NA 124* 131* 134* 137  K 6.0* 4.4 3.7 3.7  CL 85* 98 103 103  CO2 25 24 25 28   GLUCOSE 854* 522* 182* 97  BUN 29* 27* 20 19  CREATININE 1.33 1.01 0.79 0.79  CALCIUM 8.9 8.4 8.3* 8.2*  MG  --  2.2  --   --   PHOS  --  3.2  --   --    Liver Function Tests:  Recent Labs Lab 10/14/14 1821  AST 29  ALT 28  ALKPHOS 82  BILITOT 1.2  PROT 7.3  ALBUMIN 3.8   No results for input(s): LIPASE, AMYLASE in the last 168 hours. No results for input(s):  AMMONIA in the last 168 hours. CBC:  Recent Labs Lab 10/14/14 1821 10/14/14 2228  WBC 14.5* 11.0*  HGB 15.8 14.4  HCT 45.9 41.0  MCV 98.5 95.6  PLT 247 205   Cardiac Enzymes: No results for input(s): CKTOTAL, CKMB, CKMBINDEX, TROPONINI in the last 168 hours. BNP: BNP (last 3 results) No results for input(s): BNP in the last 8760 hours.  ProBNP (last 3 results) No results for input(s): PROBNP in the last 8760 hours.  CBG:  Recent Labs Lab 10/15/14 0237 10/15/14 0434 10/15/14 0532 10/15/14 0645 10/15/14 0812  GLUCAP 171* 116* 99 80 105*    Time coordinating discharge: Over 30 minutes

## 2014-10-15 NOTE — Plan of Care (Signed)
Problem: Food- and Nutrition-Related Knowledge Deficit (NB-1.1) Goal: Nutrition education Formal process to instruct or train a patient/client in a skill or to impart knowledge to help patients/clients voluntarily manage or modify food choices and eating behavior to maintain or improve health. Outcome: Completed/Met Date Met:  10/15/14  RD consulted for nutrition education regarding diabetes.   No results found for: HGBA1C  RD provided "Carbohydrate Counting for People with Diabetes (Spanish Version)" handout from the Academy of Nutrition and Dietetics. Discussed different food groups and their effects on blood sugar, emphasizing carbohydrate-containing foods. Provided list of carbohydrates and recommended serving sizes of common foods.  Discussed importance of controlled and consistent carbohydrate intake throughout the day. Provided examples of ways to balance meals/snacks and encouraged intake of high-fiber, whole grain complex carbohydrates. Teach back method used.  Expect good compliance.  Body mass index is 22.26 kg/(m^2). Pt meets criteria for normal body weight based on current BMI.  Current diet order is carb modified, patient is consuming approximately 100% of meals at this time. Labs and medications reviewed. No further nutrition interventions warranted at this time. RD contact information provided. If additional nutrition issues arise, please re-consult RD.  Laurette Schimke Elsie, [place], [place]

## 2014-10-15 NOTE — Discharge Instructions (Signed)
La diabetes mellitus y los alimentos (Diabetes Mellitus and Food) Es importante que controle su nivel de azcar en la sangre (glucosa). El nivel de glucosa en sangre depende en gran medida de lo que usted come. Comer alimentos saludables en las cantidades [place] a lo largo del Training and development officer, aproximadamente a la misma hora [employer], lo ayudar a Chief Technology Officer su nivel de Multimedia programmer. Tambin puede ayudarlo a retrasar o Patent attorney de la diabetes mellitus. Comer de [employer] saludable incluso puede ayudarlo a Chartered loss adjuster de presin arterial y a Science writer o Theatre manager un peso saludable.  CMO PUEDEN AFECTARME LOS ALIMENTOS? Carbohidratos Los carbohidratos afectan el nivel de glucosa en sangre ms que cualquier otro tipo de alimento. El nutricionista lo ayudar a Teacher, adult education cuntos carbohidratos puede consumir en cada comida y ensearle a contarlos. El recuento de carbohidratos es importante para mantener la glucosa en sangre en un nivel saludable, en especial si utiliza insulina o toma determinados medicamentos para la diabetes mellitus. Alcohol El alcohol puede provocar disminuciones sbitas de la glucosa en sangre (hipoglucemia), en especial si utiliza insulina o toma determinados medicamentos para la diabetes mellitus. La hipoglucemia es una afeccin que puede poner en peligro la vida. Los sntomas de la hipoglucemia (somnolencia, mareos y Data processing manager) son similares a los sntomas de haber consumido mucho alcohol.  Si el mdico lo autoriza a beber alcohol, hgalo con moderacin y siga estas pautas:  Las mujeres no deben beber ms de un trago por da, y los hombres no deben beber ms de dos tragos por Training and development officer. Un trago es igual a:  12 onzas (355 ml) de cerveza  5 onzas de vino (150 ml) de vino  1,5onzas (20m) de bebidas espirituosas  No beba con el estmago vaco.  Mantngase hidratado. Beba agua, gaseosas dietticas o t helado sin azcar.  Las gaseosas comunes, los jugos y  otros refrescos podran contener muchos carbohidratos y se dCivil Service fast streamer QU ALIMENTOS NO SE RECOMIENDAN? Cuando haga las elecciones de alimentos, es importante que recuerde que todos los alimentos son distintos. Algunos tienen menos nutrientes que otros por porcin, aunque podran tener la misma cantidad de caloras o carbohidratos. Es difcil darle al cuerpo lo que necesita cuando consume alimentos con menos nutrientes. Estos son algunos ejemplos de alimentos que debera evitar ya que contienen muchas caloras y carbohidratos, pero pocos nutrientes:  GPhysicist, medicaltrans (la mayora de los alimentos procesados incluyen grasas trans en la etiqueta de Informacin nutricional).  Gaseosas comunes.  Jugos.  Caramelos.  Dulces, como tortas, pasteles, rosquillas y gSan Dimas  Comidas fritas. QU ALIMENTOS PUEDO COMER? Consuma alimentos ricos en nutrientes, que nutrirn el cuerpo y lo mantendrn saludable. Los alimentos que debe comer tambin dependern de varios factores, como:  Las caloras que necesita.  Los medicamentos que toma.  Su peso.  El nivel de glucosa en sPamelia Center  El nWilliamsvillede presin arterial.  El nivel de colesterol. Tambin debe consumir una variedad de aDot Lake [place] como:  Protenas, como carne, aves, pescado, tofu, frutos secos y semillas (las protenas de aMarco Islandmagros son mejores).  FLambert Mody  Verduras.  Productos lcteos, como lGoshen queso y yogur (descremados son mejores).  Panes, granos, pastas, cereales, arroz y frijoles.  Grasas, como aceite de oLivingston Wheeler mCentral African Republicsin grasas trans, aceite de canola, aguacate y aManal[place] TODOS LOS QUE PADECEN DIABETES MELLITUS TIENEN EL MValleyPLAN DE CIna Dado que todas las personas que padecen diabetes mellitus son distintas, no hay un solo plan de comidas que funcione para todos. Es muy  importante que se rena con un nutricionista que lo ayudar a crear un plan de comidas adecuado para usted. Document Released: 09/27/2007  Document Revised: 06/25/2013 [facility] Patient Information 2015 [place]. This information is not intended to replace advice given to you by your health care provider. Make sure you discuss any questions you have with your health care provider.  Diabetes y [place] fsica (Diabetes and Exercise) La diabetes mellitus es una enfermedad crnica y bastante frecuente, en la cual el pncreas no puede controlar adecuadamente los niveles de Dispensing optician. Hay dos tipos de diabetes: Los pacientes que sufren diabetes tipo I no producen insulina, la hormona que permite el almacenamiento en el organismo del Dispensing optician. Estas personas pueden compensar esta dificultad aplicndose inyecciones de insulina. En la diabetes tipo II, no est comprometida la produccin de cantidades adecuadas de insulina para controlar nos niveles de azcar en la sangre. Las personas que sufren diabetes tipo II controlan el nivel de azcar con la ingesta de alimentos o con medicamentos. La actividad fsica es una parte importante del tratamiento. Durante el ejercicio, los msculos utilizan gran cantidad del azcar (glucosa) que se encuentra en la sangre, para generar energa. Esto disminuye el nivel de azcar en la sangre (tiene el mismo efecto que tomar insulina). Se ha demostrado que las personas que practican deportes de resistencia son ms sensibles a la [employer] sedentarias. SNTOMAS Muchas personas que padecen diabetes leve, no tienen sntomas. Sin embargo, si no se controla, la diabetes puede traer [employer] graves que podran evitarse con el tratamiento. Entre los sntomas generales de la diabetes se incluyen:   Ganas frecuentes de Garment/textile technologist (poliuria).  Aumento de la sed (polidipsia).  Mayor consumo de alimentos (polifagia).  Fatiga.  Rendimiento atltico deficiente.  Visin borrosa.  Inflamacin de la vagina (vaginitis) debido a infecciones por hongos.  Infecciones de la piel  (poco frecuentes).  Adormecimiento de los pies (por lesiones nerviosas).  Enfermedades renales. CAUSAS En la mayor parte de los [place], la causa es desconocida. En los nios, la diabetes tiene su causa en la respuesta autoinmune de las clulas del pncreas que producen insulina. Tambin se asocia a otras enfermedades, como la fibrosis [place]. Puede tener origen gentico. PREVENCIN  Los atletas deben esforzarse por comenzar la prctica de ejercicios con el nivel de azcar en sangre bien controlado.  Los pies deben mantenerse siempre limpios y secos.  Deben evitarse las [employer] niveles de azcar en sangre no pueden tratarse fcilmente (buceo, alpinismo, natacin).  Anticipe las alteraciones en la dieta o en el entrenamiento para evitar un nivel bajo (hipoglucemia) o alto (hiperglucemia) de azcar en la sangre.  Los deportistas deben tratar de aumentar el consumo de azcar luego de ejercicios extenuantes para evitar la hipoglucemia.  La insulina de accin breve no debe inyectarse en un msculo que se ejercita activamente. El atleta debe hacer reposar la zona de la inyeccin durante al menos 1 hora despus del ejercicio.  Los pacientes con diabetes deben realizarse controles de rutina de los pies para evitar complicaciones. PRONSTICO La actividad fsica proporciona muchos beneficios a las personas con diabetes:   Disminucin de Government social research officer.  Disminucin de la presin arterial.  Con frecuencia, se reduce la necesidad de Community education officer.  Aumento de la tolerancia a los ejercicios.  Niveles de insulina ms bajos.  Prdida de peso.  Mejoramiento del perfil lipdico (disminucin del colesterol y de las lipoprotenas de baja densidad). COMPLICACIONES RELACIONADAS  Si no se realiza correctamente, un ejercicio puede traer complicaciones a una persona diabtica.   Control deficiente del azcar en sangre al realizar el ejercicio en un momento  inoportuno.  Aumento de los problemas renales debido a la deshidratacin.  Aumento de las [employer] nervios (neuropatas) cuando se realizan ejercicios que incrementan los traumatismos en el pie.  Aumento del riesgo de los problemas oculares al contener la respiracin y al Data processing manager en las que se deba bajar o tensionar la cabeza.  Aumento del riesgo de muerte sbita relacionada con la prctica de actividad fsica en aquellos pacientes que sufren enfermedades cardiovasculares.  Se favorece la presin arterial elevada (hipertensin) al levantar objetos pesados (ms de 5 Kg.). Alteracin de las dosis de azcar e insulina como resultado de un trastorno leve que causa prdida del apetito.  Alteracin de la absorcin de insulina luego de la inyeccin, cuando se cambia el sitio de colocacin de la misma. NOTA: La actividad fsica puede disminuir el nivel de azcar en la sangre de manera efectiva, pero los efectos tienen poca duracin (no ms de un par [employer]). Se ha demostrado que la actividad fsica mejora la sensibilidad a la insulina. Por lo tanto la respuesta a una dosis determinada de insulina puede alterarse. Es importante que los pacientes diabticos conozcan el modo en que su organismo reacciona a la actividad fsica y [place] las dosis de Youth worker. TRATAMIENTO  Coma algo entre 1 y 3 horas antes de Education officer, museum fsica.  Controle el nivel de azcar en sangre inmediatamente antes de cada ejercicio.  Detenga la actividad fsica si el nivel de azcar es de ms de 250 mg/dL.  Detenga la actividad fsica si el nivel de azcar es de menos de 100 mg/dL.  No haga ejercicios dentro de la hora de aplicada la inyeccin de insulina.  Est preparado para tratar el bajo nivel de azcar mientras practica el ejercicio. Lleve con usted algn producto que contenga azcar (como caramelos).  Si debe practicar ejercicios durante un tiempo prolongado, use bebidas para  deporte para mantener el nivel de glucosa.  Reponga la glucosa de su organismo despus del ejercicio.  Consuma lquidos durante y despus de cada ejercicio para evitar la deshidratacin. CONSULTE AL PROFESIONAL QUE LO ASISTE SI:  Experimenta cambios en la visin luego de una carrera.  Experimenta prdida de la sensibilidad en los pies luego de los ejercicios.  Aumenta el adormecimiento, el hormigueo o tiene sensacin de pinchazos luego de Psychologist, prison and probation services actividad fsica.  Experimenta dolor en el pecho durante o despus de los ejercicios.  Siente latidos cardacos acelerados en el pecho (palpitaciones) durante o despus de los ejercicios.  La tolerancia a los ejercicios disminuye.  Experimenta desmayos, mareos o prdida de la conciencia durante breves perodos despus o durante la prctica de la [place]. Document Released: 04/06/2006 Document Revised: 09/12/2011 [facility] Patient Information 2015 [place]. This information is not intended to replace advice given to you by your health care provider. Make sure you discuss any questions you have with your health care provider.  Diabetes y normas bsicas de atencin mdica (Diabetes and Standards of Medical Care) La diabetes es una enfermedad complicada. El equipo que trate su diabetes deber incluir un nutricionista, un enfermero, un educador para la diabetes, un oftalmlogo y ms. Para que todas las personas conozcan sobre su enfermedad y para que los pacientes tengan los cuidados que necesitan, se crearon las siguientes normas bsicas para un mejor control. A continuacin se indican los  estudios, vacunas, medicamentos, educacin y planes que necesitar. Prueba de HbA1c Esta prueba muestra cmo ha sido controlada su glucosa en los ltimos 2 o 3 meses. Se utiliza para verificar si el plan de control de la diabetes debe ser ajustado.   Hgalos al menos 2 veces al ao si cumple los objetivos del [place].  Si le han cambiado el  tratamiento o si no cumple con los objetivos del [place], debe hacerlo 4 veces al ao. Control de la presin arterial.  Hgalas en cada visita mdica de rutina. El objetivo es tener menos de 140/90 mm Hg en la mayora de las personas, pero 130/80 mm Hg en algunos casos. Consulte a su mdico acerca de su objetivo. Examen dental.  Concurra regularmente a las visitas de control con el dentista. Examen ocular.  Si le diagnosticaron diabetes tipo 1 siendo un nio, debe hacerse estudios al llegar a los 10 aos o ms y si ha sufrido de diabetes durante 3 a 5 aos. Se recomienda hacer anualmente los exmenes oculares despus de ese examen inicial.  Si le diagnosticaron diabetes tipo 1 siendo adulto, hgase un examen dentro de los 5 aos del diagnstico y luego una vez por ao.  Si le diagnosticaron diabetes tipo 2, hgase un estudio lo antes posible despus del diagnstico y luego una vez por ao. Examen de los pies  Se har una inspeccin visual en cada visita mdica de rutina. Estos controles observarn si hay cortes, lesiones u otros problemas en los pies.  Una vez por ao debe hacerse un examen ms intensivo. Este examen incluye la inspeccin visual y Ardelia Mems evaluacin de los pulsos del pie y la sensibilidad.  Contrlese los pies todas las noches para ver si hay cortes, lesiones u otros problemas. Comunquese con su mdico si observa que no se curan. Estudio de la funcin renal (microalbmina en orina)  Debe realizarse una vez por ao.  Diabetes tipo 1: La primera prueba se realiza a los 5 aos despus del diagnstico.  Diabetes tipo2: La primera prueba se realiza en el momento del diagnstico.  La creatinina srica y el ndice de filtracin glomerular estimada (eGFR, por sus siglas en ingls) se realizan una vez por ao para informar el nivel de enfermedad renal crnica, si la hubiera. Perfil lipdico (colesterol, HDL, LDL, triglicridos).  La mayora de las personas lo hacen cada 5  aos.  En relacin al LDL, el objetivo es tener menos de 100 mg/dl. Si tiene alto riesgo, el objetivo es tener menos de 70 mg/dl.  En relacin al HDL, el objetivo es [employer] 40 y 35 mg/dl para los hombres y entre 12 y 51 mg/dl para las mujeres. Un nivel de colesterol HDL de 60 mg/dl o superior da una cierta proteccin contra la enfermedad cardaca.  En relacin a los triglicridos, el objetivo es tener menos de 150 mg/dl. Vacuna contra la gripe, contra el neumococo y contra la hepatitis B  Se recomienda aplicarse la vacuna contra la gripe anualmente.  Se recomienda que las [employer] de 65aos con diabetes se apliquen la vacuna contra la neumona. En algunos casos, pueden administrarse dos inyecciones separadas. Pregntele al mdico si tiene la vacuna contra la neumona al da.  Tambin se recomienda la aplicacin de la vacuna contra la hepatitis B en los adultos con diabetes. Educacin para el autocontrol de la diabetes  Recomendaciones al momento del diagnstico y los controles segn sea necesario. Plan de tratamiento  Su plan de tratamiento ser Donzetta Sprung en cada  visita mdica. Document Released: 09/14/2009 Document Revised: 11/04/2013 [facility] Patient Information 2015 [place]. This information is not intended to replace advice given to you by your health care provider. Make sure you discuss any questions you have with your health care provider.

## 2014-10-15 NOTE — Progress Notes (Addendum)
Inpatient Diabetes Program Recommendations  AACE/ADA: New Consensus Statement on Inpatient Glycemic Control (2013)  Target Ranges:  Prepandial:   less than 140 mg/dL      Peak postprandial:   less than 180 mg/dL (1-2 hours)      Critically ill patients:  140 - 180 mg/dL    Results for Duane Cuevas, MENTA (MRN [id number]) as of 10/15/2014 08:49  Ref. Range 10/14/2014 18:21  Sodium Latest Ref Range: 135-145 mmol/L 124 (L)  Potassium Latest Ref Range: 3.5-5.1 mmol/L 6.0 (H)  Chloride Latest Ref Range: 96-112 mmol/L 85 (L)  CO2 Latest Ref Range: 19-32 mmol/L 25  BUN Latest Ref Range: 6-23 mg/dL 29 (H)  Creatinine Latest Ref Range: 0.50-1.35 mg/dL 1.33  Calcium Latest Ref Range: 8.4-10.5 mg/dL 8.9  EGFR (Non-African Amer.) Latest Ref Range: >90 mL/min 57 (L)  EGFR (African American) Latest Ref Range: >90 mL/min 66 (L)  Glucose Latest Ref Range: 70-99 mg/dL 854 (HH)  Anion gap Latest Ref Range: 5-15  14    Admit with: Hyperglycemia/ New Diagnosis of DM  History: COPD    **Patient admitted with glucose of 854 mg/dl, symptoms of hyperglycemia.  Of note, patient has been taking steroids for COPD for the last two months.  **Given 10 units Lantus at 8AM today to transition off the IV insulin drip.  **Do not see an order for A1c level- Patient will need A1c prior to discharge.  **Have ordered DM educational materials for this patient and RD consult.  Will see patient asap with translator.    MD- Please add Novolog Sensitive SSI tid ac + HS  MD- Please also order hemoglobin A1c level for this patient  We will need to keep cost considerations in mind when planning discharge medications for this patient.  Patient currently does NOT have health insurance coverage.     Addendum 12PM: Spoke with pt about new diagnosis.  Explained what an A1C is, basic pathophysiology of DM Type 2, basic home care, basic diabetes diet nutrition principles, importance of checking CBGs and maintaining good  CBG control to prevent long-term and short-term complications.  Reviewed signs and symptoms of hyperglycemia and hypoglycemia and how to treat hypoglycemia at home.  Also reviewed blood sugar goals at home.    RNs to provide ongoing basic DM education at bedside with this patient.  Have ordered educational booklet and DM videos.  Have also placed RD consult for DM diet education for this patient.  Have asked RNs to allow patient to practice checking fingerstick glucose levels with hospital meter before discharge.  Patient told me his wife also has DM and that she will be able to help him.  Stressed the importance of being careful with portion sizes at home, especially serving sizes of tortillas, rice and beans.  Patient stated he eats a lot of the aforementioned foods at home.  Advised patient to drink mostly water and to avoid regular sodas, sweet tea, fruit juice, and other sweetened beverages.  Patient stated he will switch to diet sodas and use Splenda in his tea.  Also strongly encouraged patient to purchase a CBG meter OTC at [facility].  Gave patient information on purchasing an inexpensive CBG meter and strips at [employer].  Meter is $16 and a box of 50 strips is $9.  Also stressed the importance of regular medical follow-up care.  Encouraged patient to seek regular medical care at the [facility].  Also discussed with patient what Metformin is, how to  take, where to purchase (recommend [facility], $4 for Metformin), and potential side effects.    Will follow Duane Quaker RN, MSN, CDE Diabetes Coordinator Inpatient Diabetes Program Team Pager: [phone] (8a-5p)

## 2014-10-15 NOTE — Progress Notes (Signed)
CARE MANAGEMENT NOTE 10/15/2014  Patient:  Duane Cuevas   Account Number:  [id number]  Date Initiated:  10/15/2014  Documentation initiated by:  Duane Cuevas  Subjective/Objective Assessment:   hyperglycemia with iv insulin required     Action/Plan:   home when stable   Anticipated DC Date:  10/18/2014   Anticipated DC Plan:  HOME/SELF CARE  In-house referral  [place]  CM consult  [employer] [facility]  Medication Assistance      Choice offered to / List presented to:  C-1 Patient   DME arranged  NA      DME agency  NA        Status of service:  In process, will continue to follow Medicare Important Message given?   (If response is "NO", the following Medicare IM given date fields will be blank) Date Medicare IM given:   Medicare IM given by:   Date Additional Medicare IM given:   Additional Medicare IM given by:    Discharge Disposition:    Per UR Regulation:  Reviewed for med. necessity/level of care/duration of stay  If discussed at [place] of Stay Meetings, dates discussed:    Comments:  October 15, 2014/Duane Howser L. Rosana Hoes, RN, BSN, CCM. Case Management [place] [phone] No discharge needs present of time of review.

## 2014-10-16 LAB — HEMOGLOBIN A1C
Hgb A1c MFr Bld: 11.1 % — ABNORMAL HIGH (ref 4.8–5.6)
Mean Plasma Glucose: 272 mg/dL

## 2014-10-21 LAB — CULTURE, BLOOD (ROUTINE X 2)
Culture: NO GROWTH
Culture: NO GROWTH

## 2014-12-14 ENCOUNTER — Encounter (HOSPITAL_COMMUNITY): Payer: Self-pay

## 2014-12-14 ENCOUNTER — Emergency Department (HOSPITAL_COMMUNITY): Payer: Self-pay

## 2014-12-14 ENCOUNTER — Emergency Department (HOSPITAL_COMMUNITY)
Admission: EM | Admit: 2014-12-14 | Discharge: 2014-12-14 | Disposition: A | Discharge status: Home / Self Care | Payer: Self-pay | Attending: Emergency Medicine | Admitting: Emergency Medicine

## 2014-12-14 DIAGNOSIS — M12811 Other specific arthropathies, not elsewhere classified, right shoulder: Secondary | ICD-10-CM

## 2014-12-14 DIAGNOSIS — M25511 Pain in right shoulder: Secondary | ICD-10-CM | POA: Insufficient documentation

## 2014-12-14 DIAGNOSIS — Z79899 Other long term (current) drug therapy: Secondary | ICD-10-CM | POA: Insufficient documentation

## 2014-12-14 DIAGNOSIS — M129 Arthropathy, unspecified: Secondary | ICD-10-CM | POA: Insufficient documentation

## 2014-12-14 DIAGNOSIS — Z720 Tobacco use: Secondary | ICD-10-CM | POA: Insufficient documentation

## 2014-12-14 DIAGNOSIS — E119 Type 2 diabetes mellitus without complications: Secondary | ICD-10-CM | POA: Insufficient documentation

## 2014-12-14 DIAGNOSIS — J449 Chronic obstructive pulmonary disease, unspecified: Secondary | ICD-10-CM | POA: Insufficient documentation

## 2014-12-14 MED ORDER — NAPROXEN 375 MG PO TABS: 375.0000 mg | ORAL_TABLET | Freq: Two times a day (BID) | ORAL | Status: DC

## 2014-12-14 MED ORDER — HYDROCODONE-ACETAMINOPHEN 5-325 MG PO TABS
2.0000 | ORAL_TABLET | Freq: Once | ORAL | Status: AC
Start: 2014-12-14 — End: 2014-12-14
  Administered 2014-12-14: 2 via ORAL
  Filled 2014-12-14: qty 2

## 2014-12-14 MED ORDER — HYDROCODONE-ACETAMINOPHEN 5-325 MG PO TABS: 1.0000 | ORAL_TABLET | ORAL | Status: DC | PRN

## 2014-12-14 MED ORDER — IBUPROFEN 200 MG PO TABS
600.0000 mg | ORAL_TABLET | Freq: Once | ORAL | Status: AC
  Administered 2014-12-14: 600 mg via ORAL
  Filled 2014-12-14: qty 3

## 2014-12-14 NOTE — ED Provider Notes (Signed)
CSN: [id number]     Arrival date & time 12/14/14  1944 History   First MD Initiated Contact with Patient 12/14/14 2023     Chief Complaint  Patient presents with  . Chest Pain     (Consider location/radiation/quality/duration/timing/severity/associated sxs/prior Treatment) HPI Comments: 59 year old male with diabetes COPD presents with significant right shoulder and arm pain worse with range of motion for the past 2 weeks. Patient is made no labor and has been worsening. Patient has no known rotator injury. Smoker. No direct trauma however patient regularly lifts heavy things. Patient has decreased range of motion. Intermittent radiation towards upper lateral chest however majority of his right shoulder. No fevers.  Patient is a 59 y.o. male presenting with chest pain. The history is provided by the patient and a relative.  Chest Pain Associated symptoms: no abdominal pain, no back pain, no fever, no headache, no shortness of breath and not vomiting     Past Medical History  Diagnosis Date  . COPD (chronic obstructive pulmonary disease)   . Emphysema/COPD    Past Surgical History  Procedure Laterality Date  . Foot surgery     History reviewed. No pertinent family history. History  Substance Use Topics  . Smoking status: Current Every Day Smoker -- 1.00 packs/day    Types: Cigarettes  . Smokeless tobacco: Never Used  . Alcohol Use: 3.6 oz/week    6 Cans of beer per week    Review of Systems  Constitutional: Negative for fever and chills.  HENT: Negative for congestion.   Eyes: Negative for visual disturbance.  Respiratory: Negative for shortness of breath.   Cardiovascular: Positive for chest pain.  Gastrointestinal: Negative for vomiting and abdominal pain.  Genitourinary: Negative for dysuria and flank pain.  Musculoskeletal: Negative for back pain, joint swelling, neck pain and neck stiffness.  Skin: Negative for rash.  Neurological: Negative for light-headedness and  headaches.      Allergies  Review of patient's allergies indicates no known allergies.  Home Medications   Prior to Admission medications   Medication Sig Start Date End Date Taking? Authorizing Provider  metFORMIN (GLUCOPHAGE) 500 MG tablet Take 1 tablet (500 mg total) by mouth 2 (two) times daily with a meal. Patient taking differently: Take 500 mg by mouth 2 (two) times daily as needed (high blood sugar).  10/15/14  Yes Robbie Lis, MD  HYDROcodone-acetaminophen (NORCO) 5-325 MG per tablet Take 1-2 tablets by mouth every 4 (four) hours as needed. 12/14/14   Elnora Morrison, MD  naproxen (NAPROSYN) 375 MG tablet Take 1 tablet (375 mg total) by mouth 2 (two) times daily. 12/14/14   Elnora Morrison, MD   BP 110/68 mmHg  Pulse 82  Temp(Src) 98.4 F (36.9 C) (Oral)  Resp 20  Ht 5\' 9"  (1.753 m)  Wt 160 lb (72.576 kg)  BMI 23.62 kg/m2  SpO2 94% Physical Exam  Constitutional: He is oriented to person, place, and time. He appears well-developed and well-nourished.  HENT:  Head: Normocephalic and atraumatic.  Eyes: Conjunctivae are normal. Right eye exhibits no discharge. Left eye exhibits no discharge.  Neck: Normal range of motion. Neck supple. No tracheal deviation present.  Cardiovascular: Normal rate and regular rhythm.   Pulmonary/Chest: Effort normal and breath sounds normal.  Abdominal: Soft. He exhibits no distension. There is no tenderness. There is no guarding.  Musculoskeletal: He exhibits tenderness. He exhibits no edema.  Patient is significant tenderness to palpation of right lateral shoulder, more significant with any attempted  range of motion flexion external rotation. No swelling or warmth the joint. Neurovascular intact distal right arm. No neck pain. Neck supple. Patient has 5+ strength with grip wrist extension and arm flexion difficulty assessing the shoulder due to pain  Neurological: He is alert and oriented to person, place, and time.  Skin: Skin is warm. No rash  noted.  Psychiatric: He has a normal mood and affect.  Nursing note and vitals reviewed.   ED Course  Procedures (including critical care time) Labs Review Labs Reviewed - No data to display  Imaging Review Dg Shoulder Right  12/14/2014   CLINICAL DATA:  Chest pain and right shoulder pain radiating down the arm. Shortness of breath. History of emphysema. Heavy lifting at work. Unsure of trauma. Three months ago felt a pop in the right shoulder while lifting and has head pain ever since.  EXAM: RIGHT SHOULDER - 2+ VIEW  COMPARISON:  04/13/2012  FINDINGS: Degenerative changes in the glenohumeral joint. Subacromial spurring. Loss of subacromial space may indicate chronic rotator cuff arthropathy. No evidence of acute fracture or dislocation in the right shoulder.  IMPRESSION: Degenerative changes with subacromial spurring and loss of subacromial space suggesting rotator cuff arthropathy.   Electronically Signed   By: Lucienne Capers M.D.   On: 12/14/2014 21:14     EKG Interpretation None     EKG reviewed heart rate 89, no acute ischemic changes, normal QT, sinus  MDM   Final diagnoses:  Acute shoulder pain, right  Rotator cuff arthropathy, right   Patient presented as chest pain in triage however patient has clinical concern for rotator injury. Shoulder x-ray reviewed showing degeneration. Pain medicines given. EKG done in triage unremarkable. Follow-up with orthopedics  Results and differential diagnosis were discussed with the patient/parent/guardian. Close follow up outpatient was discussed, comfortable with the plan.   Medications  ibuprofen (ADVIL,MOTRIN) tablet 600 mg (600 mg Oral Given 12/14/14 2109)  HYDROcodone-acetaminophen (NORCO/VICODIN) 5-325 MG per tablet 2 tablet (2 tablets Oral Given 12/14/14 2109)    Filed Vitals:   12/14/14 2001 12/14/14 2031 12/14/14 2123  BP: 104/69 101/68 110/68  Pulse: 89 83 82  Temp: 98.4 F (36.9 C)    TempSrc: Oral    Resp: 22 29 20    Height: 5\' 9"  (1.753 m)    Weight: 160 lb (72.576 kg)    SpO2: 94% 93% 94%    Final diagnoses:  Acute shoulder pain, right  Rotator cuff arthropathy, right        Elnora Morrison, MD 12/14/14 2132

## 2014-12-14 NOTE — ED Notes (Signed)
Pt complains of chest pain and right arm pain since yesterday afternoon

## 2014-12-14 NOTE — ED Notes (Signed)
Patient transported to X-ray 

## 2014-12-14 NOTE — ED Notes (Signed)
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

## 2014-12-14 NOTE — Discharge Instructions (Signed)
If you were given medicines take as directed.  If you are on coumadin or contraceptives realize their levels and effectiveness is altered by many different medicines.  If you have any reaction (rash, tongues swelling, other) to the medicines stop taking and see a physician.    If your blood pressure was elevated in the ER make sure you follow up for management with a primary doctor or return for chest pain, shortness of breath or stroke symptoms.  Please follow up as directed and return to the ER or see a physician for new or worsening symptoms.  Thank you. Filed Vitals:   12/14/14 2001 12/14/14 2031 12/14/14 2123  BP: 104/69 101/68 110/68  Pulse: 89 83 82  Temp: 98.4 F (36.9 C)    TempSrc: Oral    Resp: 22 29 20   Height: 5\' 9"  (1.753 m)    Weight: 160 lb (72.576 kg)    SpO2: 94% 93% 94%

## 2015-03-03 ENCOUNTER — Encounter (HOSPITAL_COMMUNITY): Payer: Self-pay | Admitting: Emergency Medicine

## 2015-03-03 ENCOUNTER — Emergency Department (HOSPITAL_COMMUNITY): Payer: Self-pay

## 2015-03-03 ENCOUNTER — Emergency Department (HOSPITAL_COMMUNITY)
Admission: EM | Admit: 2015-03-03 | Discharge: 2015-03-03 | Disposition: A | Discharge status: Home / Self Care | Payer: Self-pay | Attending: Emergency Medicine | Admitting: Emergency Medicine

## 2015-03-03 DIAGNOSIS — R634 Abnormal weight loss: Secondary | ICD-10-CM | POA: Insufficient documentation

## 2015-03-03 DIAGNOSIS — J441 Chronic obstructive pulmonary disease with (acute) exacerbation: Secondary | ICD-10-CM | POA: Insufficient documentation

## 2015-03-03 DIAGNOSIS — Z720 Tobacco use: Secondary | ICD-10-CM | POA: Insufficient documentation

## 2015-03-03 DIAGNOSIS — R110 Nausea: Secondary | ICD-10-CM | POA: Insufficient documentation

## 2015-03-03 DIAGNOSIS — R6883 Chills (without fever): Secondary | ICD-10-CM | POA: Insufficient documentation

## 2015-03-03 LAB — CBC
HEMATOCRIT: 47.5 % (ref 39.0–52.0)
Hemoglobin: 16.5 g/dL (ref 13.0–17.0)
MCH: 32.5 pg (ref 26.0–34.0)
MCHC: 34.7 g/dL (ref 30.0–36.0)
MCV: 93.7 fL (ref 78.0–100.0)
PLATELETS: 199 10*3/uL (ref 150–400)
RBC: 5.07 MIL/uL (ref 4.22–5.81)
RDW: 12.6 % (ref 11.5–15.5)
WBC: 7 10*3/uL (ref 4.0–10.5)

## 2015-03-03 LAB — BASIC METABOLIC PANEL
Anion gap: 10 (ref 5–15)
BUN: 13 mg/dL (ref 6–20)
CO2: 25 mmol/L (ref 22–32)
CREATININE: 0.82 mg/dL (ref 0.61–1.24)
Calcium: 9.4 mg/dL (ref 8.9–10.3)
Chloride: 103 mmol/L (ref 101–111)
GFR calc Af Amer: >60 mL/min (ref >60)
GLUCOSE: 82 mg/dL (ref 65–99)
Potassium: 3.9 mmol/L (ref 3.5–5.1)
Sodium: 138 mmol/L (ref 135–145)

## 2015-03-03 MED ORDER — ALBUTEROL SULFATE (2.5 MG/3ML) 0.083% IN NEBU
5.0000 mg | INHALATION_SOLUTION | Freq: Once | RESPIRATORY_TRACT | Status: AC
  Administered 2015-03-03: 5 mg via RESPIRATORY_TRACT
  Filled 2015-03-03: qty 6

## 2015-03-03 MED ORDER — ALBUTEROL SULFATE (2.5 MG/3ML) 0.083% IN NEBU: 5.0000 mg | INHALATION_SOLUTION | Freq: Four times a day (QID) | RESPIRATORY_TRACT | Status: DC | PRN

## 2015-03-03 MED ORDER — PREDNISONE 20 MG PO TABS: ORAL_TABLET | ORAL | Status: DC

## 2015-03-03 MED ORDER — ALBUTEROL SULFATE HFA 108 (90 BASE) MCG/ACT IN AERS
2.0000 | INHALATION_SPRAY | Freq: Four times a day (QID) | RESPIRATORY_TRACT | Status: DC | PRN
  Filled 2015-03-03: qty 6.7

## 2015-03-03 NOTE — ED Provider Notes (Signed)
CSN: [id number]     Arrival date & time 03/03/15  1427 History   First MD Initiated Contact with Patient 03/03/15 1502     Chief Complaint  Patient presents with  . Cough     (Consider location/radiation/quality/duration/timing/severity/associated sxs/prior Treatment) Patient is a 59 y.o. male presenting with cough. The history is provided by the patient and a relative. A language interpreter was used.  Cough Cough characteristics:  Productive Sputum characteristics:  Yellow and white (occasional blood streaks) Severity:  Moderate Onset quality:  Gradual Timing:  Intermittent Progression:  Worsening Chronicity:  Recurrent Smoker: yes   Associated symptoms: chills, shortness of breath and weight loss   Associated symptoms: no chest pain   Shortness of breath:    Severity:  Moderate   Onset quality:  Gradual   Duration: "a long time"   Timing:  Intermittent   Progression:  Waxing and waning   Past Medical History  Diagnosis Date  . COPD (chronic obstructive pulmonary disease)   . Emphysema/COPD   . Emphysema lung    Past Surgical History  Procedure Laterality Date  . Foot surgery     No family history on file. Social History  Substance Use Topics  . Smoking status: Current Every Day Smoker -- 1.00 packs/day    Types: Cigarettes  . Smokeless tobacco: Never Used  . Alcohol Use: 3.6 oz/week    6 Cans of beer per week    Review of Systems  Constitutional: Positive for chills and weight loss.  Respiratory: Positive for cough and shortness of breath.   Cardiovascular: Negative for chest pain.  Gastrointestinal: Positive for nausea. Negative for vomiting.  All other systems reviewed and are negative.     Allergies  Review of patient's allergies indicates no known allergies.  Home Medications   Prior to Admission medications   Medication Sig Start Date End Date Taking? Authorizing Provider  HYDROcodone-acetaminophen (NORCO) 5-325 MG per tablet Take 1-2 tablets  by mouth every 4 (four) hours as needed. Patient not taking: Reported on 03/03/2015 12/14/14   Elnora Morrison, MD  metFORMIN (GLUCOPHAGE) 500 MG tablet Take 1 tablet (500 mg total) by mouth 2 (two) times daily with a meal. Patient not taking: Reported on 03/03/2015 10/15/14   Robbie Lis, MD  naproxen (NAPROSYN) 375 MG tablet Take 1 tablet (375 mg total) by mouth 2 (two) times daily. Patient not taking: Reported on 03/03/2015 12/14/14   Elnora Morrison, MD   BP 126/92 mmHg  Pulse 24  Temp(Src) 98.3 F (36.8 C) (Oral)  Resp 18  SpO2 95% Physical Exam  Constitutional: He is oriented to person, place, and time. He appears well-developed and well-nourished.  HENT:  Head: Normocephalic.  Eyes: Conjunctivae are normal.  Neck: Neck supple.  Cardiovascular: Normal rate and regular rhythm.   Pulmonary/Chest: Effort normal. He has wheezes. He exhibits no tenderness.  Abdominal: Soft. He exhibits no distension. There is no tenderness.  Musculoskeletal: He exhibits no edema or tenderness.  Neurological: He is alert and oriented to person, place, and time.  Skin: Skin is warm and dry.  Psychiatric: He has a normal mood and affect.  Nursing note and vitals reviewed.   ED Course  Procedures (including critical care time) Labs Review Labs Reviewed  BASIC METABOLIC PANEL  CBC    Imaging Review Dg Chest 2 View  03/03/2015   CLINICAL DATA:  COPD and cough 3 days.  Smoker.  EXAM: CHEST  2 VIEW  COMPARISON:  10/14/2014  FINDINGS: COPD  with hyperinflation. Progressive hyperinflation since the prior study.  Negative for pneumonia.  Negative for heart failure or mass lesion.  IMPRESSION: COPD without acute abnormality.   Electronically Signed   By: Franchot Gallo M.D.   On: 03/03/2015 16:07   I have personally reviewed and evaluated these images and lab results as part of my medical decision-making.   EKG Interpretation None     COPD exacerbation. Short course of steroids. Albuterol MDI. Refill of  albuterol solution. Resource information provided for locating PCP. MDM   Final diagnoses:  None   COPD exacerbation.    Etta Quill, NP 03/03/15 2919  Sherwood Gambler, MD 03/07/15 [phone]

## 2015-03-03 NOTE — Discharge Instructions (Signed)
Enfermedad pulmonar obstructiva crnica (Chronic Obstructive Pulmonary Disease) La enfermedad pulmonar obstructiva crnica (EPOC) es una enfermedad pulmonar comn en la que el flujo de aire que proviene de los pulmones est restringido. EPOC es un trmino general que puede utilizarse para describir muchas enfermedades pulmonares distintas que restringen el flujo de aire, incluidos bronquitis crnica y enfisema. Si tiene EPOC, el funcionamiento de sus pulmones probablemente nunca vuelva a ser normal, pero puede tomar medidas para mejorar la funcin pulmonar y comenzar a Sports administrator.  CAUSAS   Fumar (comn).  Exposicin al humo como fumador pasivo.  Problemas genticos.  Enfermedades pulmonares inflamatorias crnicas o infecciones recurrentes. SNTOMAS   Falta de aire, especialmente al realizar actividad fsica.  Tos intensa y persistente (crnica) con gran produccin de mucosidad espesa.  Sibilancias.  Respiraciones rpidas (taquipnea).  Coloracin gris o azulada de la piel (cianosis), especialmente de los labios, los dedos de las manos o de los pies.  Fatiga.  Prdida de peso.  Infecciones frecuentes o episodios en los que los sntomas respiratorios empeoran mucho (exacerbaciones).  Opresin en el pecho. DIAGNSTICO  El mdico le har una historia clnica y un examen fsico para Optometrist un diagnstico inicial. Las pruebas adicionales para el EPOC incluyen:   Pruebas de la funcin pulmonar.  Radiografa de trax.  Tomografa computarizada.  Anlisis de [place]. TRATAMIENTO  El tratamiento disponible para ayudarlo a que sienta mejor cuando tiene EPOC incluye:   Inhaladores y nebulizadores. Estos ayudan a Emergency planning/management officer de la EPOC y hacen que la respiracin sea ms cmoda.  Oxgeno complementario. El oxgeno complementario solo es til si tiene un nivel bajo de oxgeno en la sangre.  Ejercicios y [place] fsica. Estos son beneficiosos para casi todas las  personas con EPOC. Algunas personas tambin pueden beneficiarse con un programa de rehabilitacin pulmonar. INSTRUCCIONES PARA EL CUIDADO EN EL Pateros todos los medicamentos (inhalados o pldoras) como se lo haya indicado el mdico.  Evite los medicamentos de venta libre o los jarabes para la tos que secan las vas respiratorias (como los antihistamnicos) y Adult nurse eliminacin de las secreciones, a menos que el mdico le indique lo contrario.  Si es fumador, lo ms importante que puede hacer es dejar el hbito. Si contina fumando, el dao a los pulmones ser mayor y tendr ms problemas respiratorios. Pdale ayuda al mdico, si es necesario. l podr recomendarle recursos u hospitales comunitarios que brindan apoyo.  Evite la exposicin a factores irritantes, como el humo, productos qumicos y vapores, porque pueden agravar el trastorno respiratorio.  Use la terapia con oxgeno y la rehabilitacin pulmonar si se lo indica su mdico. Si debe recibir terapia con oxgeno en su casa, pregntele al mdico si debe comprar un pulsioxmetro para medirse el nivel de oxgeno en casa.  Evite el contacto con personas que tengan una enfermedad contagiosa.  Evite los cambios extremos de temperatura y humedad .  Consuma alimentos saludables. Consumir porciones ms pequeas y frecuentes y Production assistant, radio antes de las comidas puede ayudar a Theatre manager su fuerza.  Mantngase activo, pero equilibre la [place] con perodos de descanso. La prctica de ejercicios y [place] fsica lo ayudar a Theatre manager la capacidad de Field seismologist las cosas que desea Field seismologist.  Si tiene EPOC, es muy importante prevenir la infeccin y la hospitalizacin. Asegrese de recibir todas las vacunas que le recomiende el mdico, especialmente las vacunas contra el neumococo y la gripe. Pregntele a su mdico si necesita aplicarse una vacuna contra la neumona.  Maia Breslow  y [place] tcnicas de relajacin para controlar el estrs.  Aprenda y  [place] tcnicas de respiracin controlada segn las indicaciones de su mdico. Las tcnicas de respiracin controlada incluyen:  Respiracin con los labios fruncidos. Para comenzar, inspire (inhale) por la nariz durante 1segundo. Luego frunza los labios como si fuera a silbar y espire (exhale) por los labios fruncidos durante 2segundos.  Respiracin diafragmtica. Comience colocando [employer] el abdomen, justo por encima de la cintura. Inhale lentamente por la Doran Durand. La mano que se encuentra sobre el abdomen debe moverse. Luego frunza los labios y exhale lentamente. Tiene que sentir que la mano que est sobre el abdomen se mueve al exhalar.  Aprenda y [employer] tos controlada para despejar la mucosidad de los pulmones. La tos controlada consiste en realizar una serie de toses cortas y progresivas. Los pasos para la tos controlada son: 1. Incline la cabeza ligeramente hacia adelante. 2. Inspire profundamente utilizando la respiracin diafragmtica. 3. Trate de mantener el aire por 3 segundos. [place] veces. 5. Escupa la mucosidad en un pauelo. 6. Descanse y repita los pasos una o dos veces segn sea necesario. SOLICITE ATENCIN MDICA SI:   Tose y elimina ms mucosidad que lo habitual.  Hay un cambio en el color o en la consistencia de la mucosidad.  Le cuesta respirar ms de lo normal.  La respiracin es ms rpida que lo habitual. SOLICITE ATENCIN MDICA DE INMEDIATO SI:   Tiene dificultad para respirar mientras est en reposo.  Tiene dificultad para respirar y Theme park manager lo siguiente:  Hablar.  Realizar las actividades fsicas habituales.  Siente un dolor en el pecho que dura ms de 5 minutos.  Tiene la piel ms ciantica que de costumbre.  El pulsioxmetro detecta saturaciones de oxgeno bajas durante ms de 86minutos. ASEGRESE DE QUE:   Comprende estas instrucciones.  Controlar su  afeccin.  Recibir ayuda de inmediato si no mejora o si empeora. Document Released: 03/30/2005 Document Revised: 11/04/2013 [facility] Patient Information 2015 [place]. This information is not intended to replace advice given to you by your health care provider. Make sure you discuss any questions you have with your health care provider. Chronic Obstructive Pulmonary Disease Chronic obstructive pulmonary disease (COPD) is a common lung condition in which airflow from the lungs is limited. COPD is a general term that can be used to describe many different lung problems that limit airflow, including both chronic bronchitis and emphysema. If you have COPD, your lung function will probably never return to normal, but there are measures you can take to improve lung function and make yourself feel better.  CAUSES   Smoking (common).   Exposure to secondhand smoke.   Genetic problems.  Chronic inflammatory lung diseases or recurrent infections. SYMPTOMS   Shortness of breath, especially with physical activity.   Deep, persistent (chronic) cough with a large amount of thick mucus.   Wheezing.   Rapid breaths (tachypnea).   Gray or bluish discoloration (cyanosis) of the skin, especially in fingers, toes, or lips.   Fatigue.   Weight loss.   Frequent infections or episodes when breathing symptoms become much worse (exacerbations).   Chest tightness. DIAGNOSIS  Your health care provider will take a medical history and perform a physical examination to make the initial diagnosis. Additional tests for COPD may include:   Lung (pulmonary) function tests.  Chest X-ray.  CT scan.  Blood tests. TREATMENT  Treatment available to  help you feel better when you have COPD includes:   Inhaler and nebulizer medicines. These help manage the symptoms of COPD and make your breathing more comfortable.  Supplemental oxygen. Supplemental oxygen is only helpful if you have a low  oxygen level in your blood.   Exercise and physical activity. These are beneficial for nearly all people with COPD. Some people may also benefit from a pulmonary rehabilitation program. HOME CARE INSTRUCTIONS   Take all medicines (inhaled or pills) as directed by your health care provider.  Avoid over-the-counter medicines or cough syrups that dry up your airway (such as antihistamines) and slow down the elimination of secretions unless instructed otherwise by your health care provider.   If you are a smoker, the most important thing that you can do is stop smoking. Continuing to smoke will cause further lung damage and breathing trouble. Ask your health care provider for help with quitting smoking. He or she can direct you to community resources or hospitals that provide support.  Avoid exposure to irritants such as smoke, chemicals, and fumes that aggravate your breathing.  Use oxygen therapy and pulmonary rehabilitation if directed by your health care provider. If you require home oxygen therapy, ask your health care provider whether you should purchase a pulse oximeter to measure your oxygen level at home.   Avoid contact with individuals who have a contagious illness.  Avoid extreme temperature and humidity changes.  Eat healthy foods. Eating smaller, more frequent meals and resting before meals may help you maintain your strength.  Stay active, but balance activity with periods of rest. Exercise and physical activity will help you maintain your ability to do things you want to do.  Preventing infection and hospitalization is very important when you have COPD. Make sure to receive all the vaccines your health care provider recommends, especially the pneumococcal and influenza vaccines. Ask your health care provider whether you need a pneumonia vaccine.  Learn and use relaxation techniques to manage stress.  Learn and use controlled breathing techniques as directed by your health  care provider. Controlled breathing techniques include:   Pursed lip breathing. Start by breathing in (inhaling) through your nose for 1 second. Then, purse your lips as if you were going to whistle and breathe out (exhale) through the pursed lips for 2 seconds.   Diaphragmatic breathing. Start by putting one hand on your abdomen just above your waist. Inhale slowly through your nose. The hand on your abdomen should move out. Then purse your lips and exhale slowly. You should be able to feel the hand on your abdomen moving in as you exhale.   Learn and use controlled coughing to clear mucus from your lungs. Controlled coughing is a series of short, progressive coughs. The steps of controlled coughing are:  7. Lean your head slightly forward.  8. Breathe in deeply using diaphragmatic breathing.  9. Try to hold your breath for 3 seconds.  10. Keep your mouth slightly open while coughing twice.  11. Spit any mucus out into a tissue.  12. Rest and repeat the steps once or twice as needed. SEEK MEDICAL CARE IF:   You are coughing up more mucus than usual.   There is a change in the color or thickness of your mucus.   Your breathing is more labored than usual.   Your breathing is faster than usual.  SEEK IMMEDIATE MEDICAL CARE IF:   You have shortness of breath while you are resting.   You have  shortness of breath that prevents you from:  Being able to talk.   Performing your usual physical activities.   You have chest pain lasting longer than 5 minutes.   Your skin color is more cyanotic than usual.  You measure low oxygen saturations for longer than 5 minutes with a pulse oximeter. MAKE SURE YOU:   Understand these instructions.  Will watch your condition.  Will get help right away if you are not doing well or get worse. Document Released: 03/30/2005 Document Revised: 11/04/2013 Document Reviewed: 02/14/2013 [facility] Patient Information 2015 [place], [place].  This information is not intended to replace advice given to you by your health care provider. Make sure you discuss any questions you have with your health care provider.   Emergency Department Resource Guide 1) Find a Doctor and Pay Out of Pocket Although you won't have to find out who is covered by your insurance plan, it is a good idea to ask around and get recommendations. You will then need to call the office and see if the doctor you have chosen will accept you as a new patient and what types of options they offer for patients who are self-pay. Some doctors offer discounts or will set up payment plans for their patients who do not have insurance, but you will need to ask so you aren't surprised when you get to your appointment.  2) Contact Your Local Health Department Not all health departments have doctors that can see patients for sick visits, but many do, so it is worth a call to see if yours does. If you don't know where your local health department is, you can check in your phone book. The CDC also has a tool to help you locate your state's health department, and many state websites also have listings of all of their local health departments.  3) Find a [facility] If your illness is not likely to be very severe or complicated, you may want to try a walk in clinic. These are popping up all over the country in pharmacies, drugstores, and shopping centers. They're usually staffed by nurse practitioners or physician assistants that have been trained to treat common illnesses and complaints. They're usually fairly quick and inexpensive. However, if you have serious medical issues or chronic medical problems, these are probably not your best option.  No Primary Care Doctor: - Call Health Connect at  [phone] - they can help you locate a primary care doctor that  accepts your insurance, provides certain services, etc. - Physician Referral Service- [phone]  Chronic Pain  Problems: Organization         Address  Phone   Notes  [facility]  [phone] Patients need to be referred by their primary care doctor.   Medication Assistance: Organization         Address  Phone   Notes  [facility] [address] [place], [place] [address] [phone] --Must be a resident of [facility] -- Must have NO insurance coverage whatsoever (no Medicaid/ Medicare, etc.) -- The pt. MUST have a primary care doctor that directs their care regularly and follows them in the community   MedAssist  [phone]   [employer]  [phone]    Agencies that provide inexpensive medical care: Organization         Address  Phone   Notes  [place]  [phone]   [facility]    (  [phone]   [facility] [place], [place] [address] [phone]   [place] [address] [place] [phone]   Planned Parenthood    [phone]   [facility]    [phone]   [place] and [place] [address], [place] Phone:  [phone], Fax:  [phone] Hours of Operation:  9 am - 6 pm, M-F.  Also accepts Medicaid/Medicare and self-pay.  [facility] for [place] [address], [address], [place] Phone: [phone], Fax: [phone]. Hours of Operation:  8:30 am - 5:30 pm, M-F.  Also accepts Medicaid and self-pay.  [facility] [place] [address] [place] Phone: [phone]   [place], [place], [place] [phone], Ext. [phone] Mondays & Thursdays: 7-9 AM.  First 15 patients are seen on a first come, first serve basis.    [place] Providers:  Organization         Address  Phone   Notes  [facility] [address], [place] [phone] Also  accepts self-pay patients.  [facility] [address], [place]  [phone]   [place], [address], [place] [phone]   [facility] [address] [place] [phone]   Lucianne Lei [address], [place]   [phone] Only accepts [place] Access [place] patients after they have their name applied to their card.   Self-Pay (no insurance) in [facility]:  Organization         Address  Phone   Notes  Sickle Cell Patients, [facility] [place] [phone]   [facility] [place] [phone]   [facility] [place]  [place], [place], [place] [phone]   [facility]/Dr. Osei-Bonsu  [address], [place] or [address], [place] [phone] Phone number for both [place] and [place] locations is the same.  Urgent Medical and [facility] [address] [place] [phone]   [facility] [address] [place] or [address] [phone]   [facility] [address] [place] [phone], phone; [phone], fax Sees patients 1st and 3rd Saturday of every month.  Must not qualify for public or private insurance (i.e. Medicaid, Medicare, [place] Health Choice, Veterans' Benefits)  Household income should be no more than 200% of the poverty level The clinic cannot treat you if you are pregnant or think you are pregnant  Sexually transmitted diseases are not treated at the clinic.    Dental Care: Organization         Address  Phone  Notes  [facility] Department of [facility] [phone] Accepts children up to age 57 who are enrolled in [place] or [place]; pregnant  women with a Medicaid card; and children who have applied for Medicaid or [place] Health Choice, but were declined, whose parents can pay a reduced fee at time of service.  [facility] Department of [facility]  [address], [place] [phone] Accepts children up  to age 55 who are enrolled in Medicaid or [place] Health Choice; pregnant women with a Medicaid card; and children who have applied for Medicaid or [place] Health Choice, but were declined, whose parents can pay a reduced fee at time of service.  [place] Adult [facility] Access PROGRAM  [place] [phone] Patients are seen by appointment only. Walk-ins are not accepted. [place] will see patients 3 years of age and older. Monday - Tuesday (8am-5pm) Most Wednesdays (8:30-5pm) $30 per visit, cash only  [facility] Adult Dental Access PROGRAM  [address], [facility] [phone] Patients are seen by appointment only. Walk-ins are not accepted. [facility] will see patients 65 years of age and older. One Wednesday Evening (Monthly: Volunteer Based).  $30 per visit, cash only  [place]  [phone] for adults; Children under age 39, call Graduate Pediatric Dentistry at [phone]. Children aged 40-14, please call [phone] to request a pediatric application.  Dental services are provided in all areas of dental care including fillings, crowns and bridges, complete and partial dentures, implants, gum treatment, root canals, and extractions. Preventive care is also provided. Treatment is provided to both adults and children. Patients are selected via a lottery and there is often a waiting list.   [facility] [address], [place]  [phone] [email or web address]   [facility] [address] [place], [place] [phone], Ext. [phone] Second and Fourth Thursday of each month, opens at 6:30 AM; Clinic ends at 9 AM.  Patients are  seen on a first-come first-served basis, and a limited number are seen during each clinic.   [facility]  [address] Hillard Danker [place], [place] [phone]   Eligibility Requirements You must have lived in [place], [place], or [place] counties for at least the last three months.   You cannot be eligible for state or federal sponsored [employer], including [employer], [place], or [employer].   You generally cannot be eligible for healthcare insurance through your employer.    How to apply: Eligibility screenings are held every Tuesday and Wednesday afternoon from 1:00 pm until 4:00 pm. You do not need an appointment for the interview!  [facility] [address] [place], [place]   [place]  [place] Department  [place]  [phone]    Behavioral Health Resources in the Community: Intensive Outpatient Programs Organization         Address  Phone  Notes  [employer] [place]. [address] [place], [place] [phone]   [facility] [address] [place], [place]   ADS: Alcohol & Drug Svcs [address] [place], [place]   [place] [address],  [place], [place] or [phone]   Substance Abuse Resources Organization         Address  Phone  Notes  Alcohol and Drug Services  [phone]   [place]  [phone]   The [place]   Chinita Pester  [phone]   Residential & Outpatient Substance Abuse Program  [phone]   Psychological Services Organization         Address  Phone  Notes  [place]  [place]  [place]   [place] [phone]  [address] [place] or [phone]    Mobile Crisis  Teams Organization         Address  Phone  Notes  [facility], Mobile Crisis Care Unit  [phone]   [facility]  [address] [place], [place]   [place] [address] [place] [phone]    Self-Help/Support Groups Organization         Address  Phone             Notes  [place]. of [place] - variety of support groups  [place] Call for more information  Narcotics Anonymous (NA), [facility] [address], [employer]   2 meetings at this location   Special educational needs teacher         Address  Phone  Notes  [facility] Residential Treatment [place],    [place]  [phone]   [facility]  [address] [place] [address]15, [place], [place]   [place], [place] [phone] Admissions: 8am-3pm M-F  Incentives Substance [place] [address],    [place], [place] [phone]   [facility] [address] [place], [place], [place]   [facility] [address],  [place], [place]   [facility] - Intensive Outpatient [address] Kristeen Mans [address], [place], [place]   [facility] ([place].,  [place], [place] [phone] or [phone]   [facility] Services (RTS) [address], [place], [place] Accepts Medicaid  [place] [address],  [facility] [place] [phone] Substance Abuse/Addiction Treatment   [facility] Organization         Address  Phone  Notes  [facility]  [phone]   Domenic Schwab, PhD [address] Arlis Porta [place], [place]   [phone] or [phone]   [place], [place] [phone]   [facility] [address], [place], [place] [phone]  Insurance/Medicaid/sponsorship through [facility] and Families [address], [address] [place]                                    [place], [place] [phone] [place] [address], [place] [phone]    Dr. Adele Schilder  [phone]   [facility] [place] Dept. 1) [address]  2) [place] 3)  [place] [address], [place] [phone]  [phone]   [place] [phone] or [phone] (After Hours)

## 2015-03-03 NOTE — ED Notes (Signed)
Pt c/o cough with yellow phlegm x 3days. Pt has COPD/emphsyema.  Pt c/o chest pain when he coughs.  Pt is a smoker.

## 2015-04-08 ENCOUNTER — Encounter (HOSPITAL_COMMUNITY): Payer: Self-pay | Admitting: *Deleted

## 2015-04-08 ENCOUNTER — Emergency Department (HOSPITAL_COMMUNITY)
Admission: EM | Admit: 2015-04-08 | Discharge: 2015-04-08 | Disposition: A | Discharge status: Home / Self Care | Payer: Self-pay | Attending: Emergency Medicine | Admitting: Emergency Medicine

## 2015-04-08 DIAGNOSIS — Z79899 Other long term (current) drug therapy: Secondary | ICD-10-CM | POA: Insufficient documentation

## 2015-04-08 DIAGNOSIS — G5602 Carpal tunnel syndrome, left upper limb: Secondary | ICD-10-CM | POA: Insufficient documentation

## 2015-04-08 DIAGNOSIS — Z720 Tobacco use: Secondary | ICD-10-CM | POA: Insufficient documentation

## 2015-04-08 DIAGNOSIS — R0789 Other chest pain: Secondary | ICD-10-CM | POA: Insufficient documentation

## 2015-04-08 DIAGNOSIS — J449 Chronic obstructive pulmonary disease, unspecified: Secondary | ICD-10-CM | POA: Insufficient documentation

## 2015-04-08 MED ORDER — HYDROCODONE-ACETAMINOPHEN 5-325 MG PO TABS
2.0000 | ORAL_TABLET | Freq: Once | ORAL | Status: AC
  Administered 2015-04-08: 2 via ORAL
  Filled 2015-04-08: qty 2

## 2015-04-08 MED ORDER — HYDROCODONE-ACETAMINOPHEN 5-325 MG PO TABS: 1.0000 | ORAL_TABLET | ORAL | Status: DC | PRN

## 2015-04-08 MED ORDER — PREDNISONE 10 MG PO TABS: 10.0000 mg | ORAL_TABLET | Freq: Once | ORAL | Status: DC

## 2015-04-08 NOTE — Discharge Instructions (Signed)
Sndrome del tnel carpiano (Carpal Tunnel Syndrome) El sndrome del tnel carpiano es una afeccin que causa dolor en la mano y en el brazo. El tnel carpiano es un espacio estrecho ubicado en el lado palmar de la [place]. Los movimientos de la [place] o ciertas enfermedades pueden causar hinchazn del tnel. Esta hinchazn comprime el nervio principal de la mueca (nervio mediano). CAUSAS  Esta afeccin puede ser causada por lo siguiente:   Movimientos repetidos de [employer].  Lesiones en la [place].  Artritis.  Un quiste o un tumor en el tnel carpiano.  Acumulacin de lquido Solicitor. A veces, se desconoce la causa de esta afeccin.  FACTORES DE RIESGO Es ms probable que esta afeccin se manifieste en:   Las personas que tienen trabajos en los que deben [employer] mismos movimientos repetidos de las [place], como los carniceros y los cajeros.  Las mujeres.  Las personas que tienen determinadas enfermedades, por ejemplo:  Diabetes.  Obesidad.  Tiroides hipoactiva (hipotiroidismo).  Insuficiencia renal. SNTOMAS  Los sntomas de esta afeccin incluyen lo siguiente:   Sensacin de hormigueo en los dedos de la mano, Production assistant, radio, el ndice y el dedo [place].  Hormigueo o adormecimiento en la mano.  Sensacin de Social research officer, government en todo el brazo, especialmente cuando la Emma y el codo estn flexionados durante [place].  Dolor en la mueca que sube por el brazo hasta el hombro.  Dolor que baja por la mano o los dedos.  Sensacin de [employer]. Tal vez tenga dificultad para tomar y Licensed conveyancer. Los sntomas pueden empeorar durante la noche.  DIAGNSTICO  Esta afeccin se diagnostica mediante la historia clnica y un examen fsico. Tambin pueden hacerle exmenes, que incluyen los siguientes:   Electromiografa (EMG). Esta prueba mide las seales elctricas que los nervios les envan a los msculos.  Radiografas. TRATAMIENTO  El  tratamiento de esta afeccin incluye lo siguiente:  Cambios en el estilo de vida. Es importante dejar de Field seismologist o modificar la actividad que caus la afeccin.  Fisioterapia o terapia ocupacional.  Analgsicos y antiinflamatorios. Esto puede incluir medicamentos que se inyectan en la [place].  Una frula para la [place].  [place]. INSTRUCCIONES PARA EL CUIDADO EN EL HOGAR  Si tiene una frula:  sela como se lo haya indicado el mdico. Qutesela solamente como se lo haya indicado el mdico.  Afloje la frula si los dedos se le entumecen, siente hormigueos o se le enfran y se tornan de Optician, dispensing.  Mantenga la frula limpia y seca. Instrucciones generales  [employer] de venta libre y los recetados solamente como se lo haya indicado el mdico.  Haga reposar la [place] de toda actividad que le cause dolor. Si la afeccin tiene relacin con Leander Rams, hable con su empleador [employer] pueden [place], por [place], usar una almohadilla para apoyar la mueca mientras tipea.  Si se lo indican, aplique hielo sobre la zona dolorida:  Ponga el hielo en una bolsa plstica.  Coloque una toalla entre la piel y la bolsa de hielo.  Coloque el hielo durante 74minutos, 2 a 3veces por Training and development officer.  Concurra a todas las visitas de control como se lo haya indicado el mdico. Esto es importante.  Haga los ejercicios como se lo hayan indicado el mdico, el fisioterapeuta o el terapeuta ocupacional. SOLICITE ATENCIN MDICA SI:   Aparecen nuevos sntomas.  El dolor no se alivia con los [employer].  Los sntomas empeoran.  Esta informacin no tiene Marine scientist el consejo del mdico. Asegrese de hacerle al mdico cualquier pregunta que tenga.   Document Released: 06/20/2005 Document Revised: 03/11/2015 Elsevier Interactive Patient Education [employer].

## 2015-04-08 NOTE — ED Notes (Signed)
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

## 2015-04-08 NOTE — ED Provider Notes (Signed)
CSN: [id number]     Arrival date & time 04/08/15  1920 History  By signing my name below, I, Soijett Blue, attest that this documentation has been prepared under the direction and in the presence of Daleen Bo, MD. Electronically Signed: Soijett Blue, ED Scribe. 04/08/2015. 8:16 PM.   Chief Complaint  Patient presents with  . Arm Pain     The history is provided by the patient and a friend. A language interpreter was used (friend; spanish).    Duane Cuevas is a 59 y.o. male with a medical hx of emphysema who presents to the Emergency Department complaining of left arm pain onset 2 weeks worsening today. He notes that he has pain with movement of his left arm. 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 He reports that he missed work today due to his symptoms but he worked yesterday and was able to do his job. He denies having these symptoms in the past. Pt is having associated symptoms of left finger numbness, left sided neck pain, cough, and chills. He denies fever, SOB, and any other symptoms.     Past Medical History  Diagnosis Date  . COPD (chronic obstructive pulmonary disease) ([place])   . Emphysema/COPD ([place])   . Emphysema lung [facility])    Past Surgical History  Procedure Laterality Date  . Foot surgery     No family history on file. Social History  Substance Use Topics  . Smoking status: Current Every Day Smoker -- 1.00 packs/day    Types: Cigarettes  . Smokeless tobacco: Never Used  . Alcohol Use: 3.6 oz/week    6 Cans of beer per week    Review of Systems  All other systems reviewed and are negative.   Allergies  Review of patient's allergies indicates no known allergies.  Home Medications   Prior to Admission medications   Medication Sig Start Date End Date Taking? Authorizing Provider  albuterol (PROVENTIL) (2.5 MG/3ML) 0.083% nebulizer solution Take 6 mLs (5 mg total) by nebulization every 6 (six)  hours as needed for wheezing or shortness of breath. 03/03/15   Etta Quill, NP  HYDROcodone-acetaminophen (NORCO) 5-325 MG tablet Take 1 tablet by mouth every 4 (four) hours as needed. 04/08/15   Daleen Bo, MD  metFORMIN (GLUCOPHAGE) 500 MG tablet Take 1 tablet (500 mg total) by mouth 2 (two) times daily with a meal. Patient not taking: Reported on 03/03/2015 10/15/14   Robbie Lis, MD  naproxen (NAPROSYN) 375 MG tablet Take 1 tablet (375 mg total) by mouth 2 (two) times daily. Patient not taking: Reported on 03/03/2015 12/14/14   Elnora Morrison, MD  predniSONE (DELTASONE) 10 MG tablet Take 1 tablet (10 mg total) by mouth once. 04/08/15   Daleen Bo, MD   BP 127/80 mmHg  Pulse 95  Temp(Src) 97.8 F (36.6 C) (Oral)  Resp 18  SpO2 95% Physical Exam  Constitutional: He is oriented to person, place, and time. He appears well-developed and well-nourished.  HENT:  Head: Normocephalic and atraumatic.  Right Ear: External ear normal.  Left Ear: External ear normal.  Eyes: Conjunctivae and EOM are normal. Pupils are equal, round, and reactive to light.  Neck: Normal range of motion and phonation normal. Neck supple.  Cardiovascular: Normal rate, regular rhythm and normal heart sounds.  Exam reveals no gallop and no friction rub.   No murmur heard. Pulmonary/Chest: Effort normal and breath sounds  normal. No respiratory distress. He has no wheezes. He exhibits no bony tenderness.  Diffuse chest wall tenderness to light palpation.  Abdominal: Soft. There is no tenderness.  Musculoskeletal: Normal range of motion.  L arm is tender upper and lower with mild spasms of the anterior and dorsal compartments.   Neurological: He is alert and oriented to person, place, and time. No cranial nerve deficit or sensory deficit. He exhibits normal muscle tone. Coordination normal.  Intact light touch sensation in the left wrist but not the hand. Decreased light touch sensation in the fingers 1-4.   Skin: Skin  is warm, dry and intact.  Psychiatric: He has a normal mood and affect. His behavior is normal. Judgment and thought content normal.  Nursing note and vitals reviewed.   ED Course  Procedures (including critical care time)  Filed Vitals:   04/08/15 1936  BP: 127/80  Pulse: 95  Temp: 97.8 F (36.6 C)  TempSrc: Oral  Resp: 18  SpO2: 95%    Medications  HYDROcodone-acetaminophen (NORCO/VICODIN) 5-325 MG per tablet 2 tablet (2 tablets Oral Given 04/08/15 2057)    COORDINATION OF CARE: 8:13 PM Discussed treatment plan with pt at bedside which includes prednisone Rx, norco Rx, warm compresses, hand splint, referral to orthopedist and pt agreed to plan.   Labs Review Labs Reviewed - No data to display  Imaging Review No results found. I have personally reviewed and evaluated these images and lab results as part of my medical decision-making.   EKG Interpretation None      MDM   Final diagnoses:  Carpal tunnel syndrome of left wrist    Evaluation is consistent with carpal tunnel, left hand. Doubt fracture, tendinitis, or arthritis.  Nursing Notes Reviewed/ Care Coordinated Applicable Imaging Reviewed Interpretation of Laboratory Data incorporated into ED treatment  The patient appears reasonably screened and/or stabilized for discharge and I doubt any other medical condition or other [facility] requiring further screening, evaluation, or treatment in the ED at this time prior to discharge.  Plan: Home Medications- Prednisone,Norco; Home Treatments- Splint and heat; return here if the recommended treatment, does not improve the symptoms; Recommended follow up- Hand Surg. 1 week  I, Cashmere Harmes L, personally performed the services described in this documentation. All medical record entries made by the scribe were at my direction and in my presence.  I have reviewed the chart and discharge instructions and agree that the record reflects my personal performance and is accurate  and complete. Meilah Delrosario L.  04/10/2015. 4:01 AM.      Daleen Bo, MD 04/10/15 0401

## 2015-04-11 ENCOUNTER — Encounter (HOSPITAL_COMMUNITY): Payer: Self-pay | Admitting: Emergency Medicine

## 2015-04-11 ENCOUNTER — Emergency Department (HOSPITAL_COMMUNITY)
Admission: EM | Admit: 2015-04-11 | Discharge: 2015-04-11 | Disposition: A | Discharge status: Home / Self Care | Payer: Self-pay | Attending: Emergency Medicine | Admitting: Emergency Medicine

## 2015-04-11 DIAGNOSIS — Z720 Tobacco use: Secondary | ICD-10-CM | POA: Insufficient documentation

## 2015-04-11 DIAGNOSIS — K4090 Unilateral inguinal hernia, without obstruction or gangrene, not specified as recurrent: Secondary | ICD-10-CM | POA: Insufficient documentation

## 2015-04-11 DIAGNOSIS — Z79899 Other long term (current) drug therapy: Secondary | ICD-10-CM | POA: Insufficient documentation

## 2015-04-11 DIAGNOSIS — J449 Chronic obstructive pulmonary disease, unspecified: Secondary | ICD-10-CM | POA: Insufficient documentation

## 2015-04-11 NOTE — Discharge Instructions (Signed)
Como ya hemos comentado: Mientras la hinchazn fcilmente se cae cuando se establecen o empujarlo hacia adentro, no es dolorosa, no tena fiebre, nuseas, vmitos, cambios en los movimientos intestinales, como diarrea o sangre en la taburete esta hernia no debera ser un problema para usted. Si desea ver a un cirujano I, dada su remisin a la ciruga [place]. No dude en volver a la sala de emergencias de cualquier nuevo empeoramiento ni respecto de los sntomas.  Do not hesitate to return to the emergency room for any new, worsening or concerning symptoms.  Please obtain primary care using resource guide below. Let them know that you were seen in the emergency room and that they will need to obtain records for further outpatient management.   Hernia inguinal - Adultos  (Inguinal Hernia, Adult)  Los msculos mantienen todos los rganos del cuerpo en Geneticist, molecular. Pero si se produce un punto dbil [employer], algunos pueden protruir. Eso se llama hernia. Cuando esto sucede en la parte inferior del vientre (abdomen), se trata de una hernia inguinal. (Toma su nombre de una parte del cuerpo que en esta regin se llamada canal inguinal). Un punto dbil en la pared de los msculos deja que un poco de grasa o parte del intestino delgado salgan hacia afuera. Una hernia inguinal puede desarrollarse a cualquier edad. Los hombres la sufren con ms frecuencia que las mujeres.  CAUSAS  En los adultos, la hernia inguinal desarrolla con el tiempo.   Las causas pueden ser:  Un esfuerzo sbito de los msculos de la parte inferior del abdomen.  Levantar objetos pesados.  Dificultad para mover el intestino. La dificultad para mover el intestino (constipacin) puede llevar a una hernia.  Tos constante. La causa puede ser el tabaquismo o una enfermedad pulmonar.  Tener sobrepeso.  El [place].  Tener un empleo que requiera Equities trader perodos de pie o levantar objetos  pesados.  Haber sufrido de una hernia inguinal anteriormente. En algunos casos puede convertirse en una situacin de Freight forwarder. Cuando esto ocurre, se llama hernia inguinal estrangulada. Se produce cuando una parte del intestino delgado se desliza a travs del punto dbil y no puede volver al abdomen. El flujo de [place] puede interrumpirse. Si esto ocurre, una parte del intestino puede morir. Esta situacin requiere [place].  SNTOMAS  Generalmente una hernia inguinal pequea no tiene sntomas. Se diagnostica cuando un profesional de la salud hace un examen fsico. Las hernias ms grandes generalmente presentan sntomas.   En los adultos, los sntomas incluyen:  Un bulto en la ingle. Es fcil de Hydrographic surveyor cuando la persona est de pie. Puede desaparecer al Pershing Proud.  Los hombres pueden tener un bulto Geographical information systems officer.  Dolor o ardor en la ingle. Esto ocurre especialmente al levantar objetos, realizar un esfuerzo o toser.  Dolor sordo o sensacin de presin en la ingle.  Los signos de una hernia estrangulada pueden ser:  Ardelia Mems protuberancia en la ingle que duele mucho y est sensible al tacto.  Un bulto que se vuelve de color rojo o prpura.  Cristy Hilts, nuseas y vmitos.  Imposibilidad de evacuar el intestino o de eliminar gases. DIAGNSTICO  Para diagnosticar una hernia inguinal, el profesional le har un examen fsico.   Incluir preguntas acerca de los sntomas que haya notado.  El mdico palpar el rea de la ingle y le pedir que tosa. Si palpa una hernia inguinal, el mdico podr tratar de deslizarla de [place] adentro  el abdomen.  Por lo general no se necesitan otros estudios. TRATAMIENTO  Los tratamientos Emergency planning/management officer. Dependern del tamao de la hernia. Las opciones incluyen:   Observacin cuidadosa. Esto a menudo se sugiere si la hernia es pequea y usted no ha tenido sntomas.  No se realizar ningn procedimiento mdico excepto que aparezcan  sntomas.  Tendr que prestar atencin a los sntomas. Si tiene sntomas, comunquese con su mdico de inmediato.  Ciruga. Se realiza si la hernia es grande o si tiene sntomas.  Ciruga abierta. Por lo general, este es un procedimiento ambulatorio (no tendr que pasar la noche en el hospital). Se realiza un corte (incisin) a travs de la piel de la ingle. La hernia se vuelve a colocar en el interior del abdomen. Luego se repara la zona dbil en los msculos con una herniorrafia o hernioplastia. Herniorrafa: en este tipo de [place], se suturan juntos los msculos dbiles. Hernioplasta: se coloca un parche o malla para cerrar el rea dbil en la pared abdominal.  Laparoscopia. En este procedimiento, el cirujano hace incisiones pequeas. Se coloca en el abdomen un tubo delgado con una pequea cmara de video (llamado laparoscopio). El cirujano repara la hernia con [place], observando en una cmara de vdeo y utilizando dos instrumentos largos. INSTRUCCIONES PARA EL CUIDADO EN EL HOGAR   Despus de la ciruga de reparacin de una hernia inguinal:  Necesitar tomar un analgsico para el dolor recetado por su mdico. Siga cuidadosamente todas las indicaciones.  Tendr que cuidar la herida de la incisin.  Deber restringir algunas actividades por un tiempo. Incluir no levantar objetos pesados durante varias semanas. Tampoco podr hacer nada demasiado activo durante algunas semanas. La vuelta al [place] depender del tipo de trabajo que tenga.  Durante perodos de "espera vigilante", usted debe:  Mantenga un peso saludable.  Consumir una dieta rica en fibra (frutas, verduras y granos enteros).  Beba gran cantidad de lquidos para evitar la constipacin. Esto significa beber suficiente agua y otros lquidos para mantener la orina clara o de color amarillo plido.  No levante objetos pesados.  No permanezca de pie durante largos perodos.  Deje de fumar. Evite toser con  frecuencia. SOLICITE ATENCIN MDICA SI:   Aparece una protuberancia en el rea de la ingle.  Siente dolor, tiene sensacin de [place] o de presin en la ingle. Esto podra empeorar si levanta pesos o hace esfuerzos.  Tiene fiebre de ms de 100.5 F (38.1 C). SOLICITE ATENCIN MDICA DE INMEDIATO SI:   El dolor en la ingle aumenta repentinamente.  Una protuberancia en la ingle se hace ms grande y no baja.  En los hombres, un dolor repentino en el escroto. O el escroto aumenta de tamao.  Un bulto en el rea de la ingle se vuelve de color rojo o prpura y es dolorosa al tacto.  Tiene nuseas o vmitos que no desaparecen.  Siente que su corazn late mucho ms rpido de lo normal.  No puede mover el intestino o eliminar gases.  Tiene fiebre de ms de 102.0 F (38.9 C).   Esta informacin no tiene Marine scientist el consejo del mdico. Asegrese de hacerle al mdico cualquier pregunta que tenga.   Document Released: 10/15/2012 Elsevier Interactive Patient Education [employer].   Emergency Department Resource Guide 1) Find a Doctor and [employer] Although you won't have to find out who is covered by your insurance plan, it is a good idea to ask around and get recommendations.  You will then need to call the office and see if the doctor you have chosen will accept you as a new patient and what types of options they offer for patients who are self-pay. Some doctors offer discounts or will set up payment plans for their patients who do not have insurance, but you will need to ask so you aren't surprised when you get to your appointment.  2) Contact Your Local Health Department Not all health departments have doctors that can see patients for sick visits, but many do, so it is worth a call to see if yours does. If you don't know where your local health department is, you can check in your phone book. The CDC also has a tool to help you locate your state's health  department, and many state websites also have listings of all of their local health departments.  3) Find a [facility] If your illness is not likely to be very severe or complicated, you may want to try a walk in clinic. These are popping up all over the country in pharmacies, drugstores, and shopping centers. They're usually staffed by nurse practitioners or physician assistants that have been trained to treat common illnesses and complaints. They're usually fairly quick and inexpensive. However, if you have serious medical issues or chronic medical problems, these are probably not your best option.  No Primary Care Doctor: - Call Health Connect at  [phone] - they can help you locate a primary care doctor that  accepts your insurance, provides certain services, etc. - Physician Referral Service- [phone]  Chronic Pain Problems: Organization         Address  Phone   Notes  [facility]  [phone] Patients need to be referred by their primary care doctor.   Medication Assistance: Organization         Address  Phone   Notes  [facility] Medication [facility] [address] [place], [place] [address] [phone] --Must be a resident of [facility] -- Must have NO insurance coverage whatsoever (no Medicaid/ Medicare, etc.) -- The pt. MUST have a primary care doctor that directs their care regularly and follows them in the community   MedAssist  [phone]   [employer]  [phone]    Agencies that provide inexpensive medical care: Organization         Address  Phone   Notes  [place]  [phone]   [facility]    [phone]   [facility] [place],  [address] [phone]   [place] [address], [place] [phone]   Planned Parenthood    [phone]   [facility]    [phone]    [place] and [place] [address], [place] Phone:  [phone], Fax:  [phone] Hours of Operation:  9 am - 6 pm, M-F.  Also accepts Medicaid/Medicare and self-pay.  [facility] for [place], [address], [place] Phone: [phone], Fax: [phone]. Hours of Operation:  8:30 am - 5:30 pm, M-F.  Also accepts Medicaid and self-pay.  [facility] [place] [address] [place] Phone: [phone]   [place], [place], [place] [phone], Ext. [phone] Mondays & Thursdays: 7-9 AM.  First 15 patients are seen on a first come,  first serve basis.    [place] Providers:  Organization         Address  Phone   Notes  [facility] [address], [place] [phone] Also accepts self-pay patients.  [facility] [address], [place]  [phone]   [place], [address], [place] [phone]   [facility] [address] [place] [phone]   Lucianne Lei [address], [place]   [phone] Only accepts [place] Access [place] patients after they have their name applied to their card.   Self-Pay (no insurance) in [facility]:  Organization         Address  Phone   Notes  Sickle Cell Patients, [facility] [place] [phone]   [facility] Urgent Care [place] [phone]   [facility] [place]  [place], [place], [place] [phone]   [facility]/Dr. Osei-Bonsu  [address] [place] or [address], [place] [phone] Phone number for both [place] and [place] locations is the same.  [facility] [place] [address], [place] [phone]    [facility] [address] [place] or [address] [phone]   [facility] [address], [place] [phone], phone; [phone], fax Sees patients 1st and 3rd Saturday of every month.  Must not qualify for public or private insurance (i.e. Medicaid, Medicare, [place] Health Choice, Veterans' Benefits)  Household income should be no more than 200% of the poverty level The clinic cannot treat you if you are pregnant or think you are pregnant  Sexually transmitted diseases are not treated at the clinic.    Dental Care: Organization         Address  Phone  Notes  [facility] Department of [facility] [place] [phone] Accepts children up to age 43 who are enrolled in [place] or [place]; pregnant women with a Medicaid card; and children who have applied for Medicaid or [place] Health Choice, but were declined, whose parents can pay a reduced fee at time of service.  [facility] Department of [facility]  [address], [place] [phone] Accepts children up to age 59 who are enrolled in [place] or [place]; pregnant women with a Medicaid card; and children who have applied for Medicaid or [place] Health Choice, but were declined, whose parents can pay a reduced fee at time of service.  [place] Adult Dental Access PROGRAM  [place] [phone] Patients are seen by appointment only. Walk-ins are not accepted. [place] will see patients 56 years of age and older. Monday - Tuesday (8am-5pm) Most Wednesdays (8:30-5pm) $30 per visit, cash only  [facility] Adult Dental Access PROGRAM  [address], [place] [facility] [phone] Patients are seen by appointment only. Walk-ins are not accepted. [place] will see patients 32 years of age and older. One Wednesday Evening (Monthly: Volunteer Based).   $30 per visit, cash only  [place]  [phone] for adults; Children under age 79, call Graduate Pediatric Dentistry at [phone]. Children aged 14-14, please  call [phone] to request a pediatric application.  Dental services are provided in all areas of dental care including fillings, crowns and bridges, complete and partial dentures, implants, gum treatment, root canals, and extractions. Preventive care is also provided. Treatment is provided to both adults and children. Patients are selected via a lottery and there is often a waiting list.   [facility] [address] [place]  [phone] [email or web address]   [facility] [address] [place], [place] [phone], Ext. [phone] Second and Fourth Thursday of each month, opens at 6:30 AM; Clinic ends at 9 AM.  Patients are seen on a first-come first-served basis, and a limited number are seen during each clinic.   [facility]  [address] Hillard Danker [place], [place] [phone]   Eligibility Requirements You must have lived in [place], [place], or [place] counties for at least the last three months.   You cannot be eligible for state or federal sponsored [employer], including [employer], [place], or [employer].   You generally cannot be eligible for healthcare insurance through your employer.    How to apply: Eligibility screenings are held every Tuesday and Wednesday afternoon from 1:00 pm until 4:00 pm. You do not need an appointment for the interview!  [facility] [address] [place], [place]   [place]  [place] Department  [place]  [phone]    Behavioral Health Resources in the Community: Intensive Outpatient Programs Organization         Address  Phone  Notes  [place]. [address] [place], [place] [phone]   [facility] Outpatient [address] [place], [place]   ADS: Alcohol & Drug Svcs [address], [place], [place]   [place] [address],  [place], [place] or [phone]   Substance Abuse Resources Organization         Address  Phone  Notes  Alcohol and Drug Services  [phone]   [place]  [phone]   The [place]   Chinita Pester  [phone]   Residential & Outpatient Substance Abuse Program  [phone]   Psychological Services Organization         Address  Phone  Notes  [facility]  [place]  [phone]   [place] [address] [place] or [phone]    Mobile Crisis Teams Organization         Address  Phone  Notes  [facility]atives, Mobile Crisis Care Unit  [phone]   Assertive Psychotherapeutic Services  [address] [place], [place]   [place] [address], [place] [phone]    Self-Help/Support Groups Organization         Address  Phone             Notes  [place]. of [place] - variety of support groups  [place] Call for more information  Narcotics Anonymous (NA), Caring Services [address], [employer] [place]  2 meetings at this location   Special educational needs teacher         Address  Phone  Notes  ASAP Residential Treatment [place],    [place]  [phone]   [place]  [id number], [place], [place]   [place], [place] [phone] Admissions: 8am-3pm M-F  Incentives Substance [place] [address]    [place], [place] [phone]   [facility] [address] [place], [place], [place]   [facility] [address],  [place], [place]   [facility]ight Programs - Intensive Outpatient [address], [place], [place]   [facility] ([place] [address].,  [place], [place] [phone] or [phone]   [facility]idential Treatment Services (RTS) [address], [place], [place] Accepts Medicaid  [facility] [place] [address]  [place] [phone] Substance Abuse/Addiction Treatment   [facility] Organization         Address  Phone  Notes  [facility]  [phone]   Domenic Schwab, PhD [address] Arlis Port[place], [place]   [phone] or [phone]   [place], [place] [phone]   [facility] [address], [place], [place] [phone] Insurance/Medicaid/sponsorship through [facility] and Families [address] [place]                                    [place], [place] [phone] [place] [address], [place] [phone]    Dr. Adele Schilder  [phone]   [facility] Dept. 1) [address], [place] 2) [place] 3)  [place] [address], [place] [phone]  [phone]   [place] [phone] or [phone] (After Hours)

## 2015-04-11 NOTE — ED Provider Notes (Signed)
CSN: [id number]     Arrival date & time 04/11/15  1214 History  By signing my name below, I, Starleen Arms, attest that this documentation has been prepared under the direction and in the presence of [employer], PA-C. Electronically Signed: Starleen Arms ED Scribe. 04/11/2015. 1:38 PM.    Chief Complaint  Patient presents with  . Abscess   The history is provided by the patient and a relative. No language interpreter was used.   HPI Comments: Duane Cuevas is a 59 y.o. male who presents to the Emergency Department complaining of a non-painful, unchanged mass on the left-sided pubis onset two days ago that resolves when the patient lays supine.  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  He denies fever, dysuria, constipation, abdominal pain, changes in bowel patterns.    Patient's daughter served as Optometrist.  Past Medical History  Diagnosis Date  . COPD (chronic obstructive pulmonary disease) ([place])   . Emphysema/COPD ([place])   . Emphysema lung [facility])    Past Surgical History  Procedure Laterality Date  . Foot surgery     No family history on file. Social History  Substance Use Topics  . Smoking status: Current Every Day Smoker -- 1.00 packs/day    Types: Cigarettes  . Smokeless tobacco: Never Used  . Alcohol Use: 3.6 oz/week    6 Cans of beer per week    Review of Systems A complete 10 system review of systems was obtained and all systems are negative except as noted in the HPI and PMH.   Allergies  Review of patient's allergies indicates no known allergies.  Home Medications   Prior to Admission medications   Medication Sig Start Date End Date Taking? Authorizing Provider  albuterol (PROVENTIL) (2.5 MG/3ML) 0.083% nebulizer solution Take 6 mLs (5 mg total) by nebulization every 6 (six) hours as needed for wheezing or shortness of breath. 03/03/15  Yes Etta Quill, NP  HYDROcodone-acetaminophen (NORCO) 5-325 MG tablet Take 1 tablet  by mouth every 4 (four) hours as needed. Patient taking differently: Take 1 tablet by mouth every 4 (four) hours as needed for moderate pain.  04/08/15  Yes Daleen Bo, MD  predniSONE (DELTASONE) 10 MG tablet Take 1 tablet (10 mg total) by mouth once. Patient taking differently: Take 10 mg by mouth daily. Started 10/02 for 21 days 04/08/15  Yes Daleen Bo, MD  metFORMIN (GLUCOPHAGE) 500 MG tablet Take 1 tablet (500 mg total) by mouth 2 (two) times daily with a meal. Patient not taking: Reported on 03/03/2015 10/15/14   Robbie Lis, MD  naproxen (NAPROSYN) 375 MG tablet Take 1 tablet (375 mg total) by mouth 2 (two) times daily. Patient not taking: Reported on 03/03/2015 12/14/14   Elnora Morrison, MD   BP 129/76 mmHg  Pulse 76  Temp(Src) 98.4 F (36.9 C) (Oral)  Resp 18  Ht 5\' 8"  (1.727 m)  Wt 165 lb (74.844 kg)  BMI 25.09 kg/m2  SpO2 99% Physical Exam  Constitutional: He is oriented to person, place, and time. He appears well-developed and well-nourished. No distress.  HENT:  Head: Normocephalic and atraumatic.  Eyes: Conjunctivae and EOM are normal.  Neck: Neck supple. No tracheal deviation present.  Cardiovascular: Normal rate.   Pulmonary/Chest: Effort normal. No respiratory distress.  Abdominal: Soft. Bowel sounds are normal. He exhibits mass. There is no tenderness. There is no rebound and no guarding.  Patient has left-sided inguinal hernia which is easily reducible and nontender,  there is no inguinal lymphadenopathy  Musculoskeletal: Normal range of motion.  Neurological: He is alert and oriented to person, place, and time.  Skin: Skin is warm and dry.  Psychiatric: He has a normal mood and affect. His behavior is normal.  Nursing note and vitals reviewed.   ED Course  Procedures (including critical care time)  DIAGNOSTIC STUDIES: Oxygen Saturation is 99% on RA, normal by my interpretation.    COORDINATION OF CARE:  1:34 PM Informed patient of suspicion of hernia.   Will provide referral.   Return precautions advised. Patient acknowledges and agrees with plan.    Labs Review Labs Reviewed - No data to display  Imaging Review No results found. I have personally reviewed and evaluated these images and lab results as part of my medical decision-making.   EKG Interpretation None      MDM   Final diagnoses:  Unilateral inguinal hernia without obstruction or gangrene, recurrence not specified    Filed Vitals:   04/11/15 1219 04/11/15 1221 04/11/15 1405  BP: 129/76  122/75  Pulse: 76  74  Temp: 98.4 F (36.9 C)    TempSrc: Oral    Resp: 18  16  Height: 5\' 8"  (1.727 m)    Weight: 165 lb (74.844 kg)    SpO2: 99% 99% 99%     Duane Cuevas is a pleasant 59 y.o. male presenting with left-sided inguinal hernia, this is easily reducible. Patient is eating and drinking well and afebrile, there is no pain. There is no signs of incarceration. I've had an extensive discussion on return precautions for possible incarcerated hernia and patient and his translator have repeated them back to me. Patient is given an outpatient surgery referral.  Evaluation does not show pathology that would require ongoing emergent intervention or inpatient treatment. Pt is hemodynamically stable and mentating appropriately. Discussed findings and plan with patient/guardian, who agrees with care plan. All questions answered. Return precautions discussed and outpatient follow up given.   I personally performed the services described in this documentation, which was scribed in my presence. The recorded information has been reviewed and is accurate.   Monico Blitz, PA-C 04/11/15 [place], MD 04/11/15 1625

## 2015-04-11 NOTE — ED Notes (Signed)
Pt reported red and swollen nodule without drainage to lt groin area. Pt denies fever.

## 2015-09-02 DIAGNOSIS — D696 Thrombocytopenia, unspecified: Secondary | ICD-10-CM

## 2015-09-02 HISTORY — DX: Thrombocytopenia, unspecified: D69.6

## 2015-09-19 ENCOUNTER — Inpatient Hospital Stay (HOSPITAL_COMMUNITY)
Admission: EM | Admit: 2015-09-19 | Discharge: 2015-09-21 | DRG: 190 | Disposition: A | Discharge status: Home / Self Care | Payer: No Typology Code available for payment source | Attending: Internal Medicine | Admitting: Internal Medicine

## 2015-09-19 ENCOUNTER — Emergency Department (HOSPITAL_COMMUNITY): Payer: No Typology Code available for payment source

## 2015-09-19 ENCOUNTER — Encounter (HOSPITAL_COMMUNITY): Payer: Self-pay | Admitting: *Deleted

## 2015-09-19 DIAGNOSIS — Z23 Encounter for immunization: Secondary | ICD-10-CM

## 2015-09-19 DIAGNOSIS — R809 Proteinuria, unspecified: Secondary | ICD-10-CM | POA: Diagnosis present

## 2015-09-19 DIAGNOSIS — E871 Hypo-osmolality and hyponatremia: Secondary | ICD-10-CM | POA: Diagnosis present

## 2015-09-19 DIAGNOSIS — E119 Type 2 diabetes mellitus without complications: Secondary | ICD-10-CM | POA: Diagnosis present

## 2015-09-19 DIAGNOSIS — J449 Chronic obstructive pulmonary disease, unspecified: Secondary | ICD-10-CM | POA: Diagnosis present

## 2015-09-19 DIAGNOSIS — J9601 Acute respiratory failure with hypoxia: Secondary | ICD-10-CM | POA: Diagnosis present

## 2015-09-19 DIAGNOSIS — R000 Tachycardia, unspecified: Secondary | ICD-10-CM | POA: Diagnosis present

## 2015-09-19 DIAGNOSIS — F17200 Nicotine dependence, unspecified, uncomplicated: Secondary | ICD-10-CM | POA: Diagnosis present

## 2015-09-19 DIAGNOSIS — Z833 Family history of diabetes mellitus: Secondary | ICD-10-CM

## 2015-09-19 DIAGNOSIS — J441 Chronic obstructive pulmonary disease with (acute) exacerbation: Secondary | ICD-10-CM | POA: Diagnosis present

## 2015-09-19 DIAGNOSIS — J189 Pneumonia, unspecified organism: Secondary | ICD-10-CM

## 2015-09-19 DIAGNOSIS — J440 Chronic obstructive pulmonary disease with acute lower respiratory infection: Principal | ICD-10-CM | POA: Diagnosis present

## 2015-09-19 DIAGNOSIS — Z825 Family history of asthma and other chronic lower respiratory diseases: Secondary | ICD-10-CM

## 2015-09-19 LAB — COMPREHENSIVE METABOLIC PANEL
ALBUMIN: 3.7 g/dL (ref 3.5–5.0)
ALT: 25 U/L (ref 17–63)
ANION GAP: 12 (ref 5–15)
AST: 32 U/L (ref 15–41)
Alkaline Phosphatase: 53 U/L (ref 38–126)
BILIRUBIN TOTAL: 0.4 mg/dL (ref 0.3–1.2)
BUN: 9 mg/dL (ref 6–20)
CALCIUM: 8.5 mg/dL — AB (ref 8.9–10.3)
CO2: 26 mmol/L (ref 22–32)
Chloride: 92 mmol/L — ABNORMAL LOW (ref 101–111)
Creatinine, Ser: 0.91 mg/dL (ref 0.61–1.24)
GFR calc non Af Amer: >60 mL/min (ref >60)
GLUCOSE: 179 mg/dL — AB (ref 65–99)
POTASSIUM: 3.7 mmol/L (ref 3.5–5.1)
SODIUM: 130 mmol/L — AB (ref 135–145)
Total Protein: 7.3 g/dL (ref 6.5–8.1)

## 2015-09-19 LAB — CBC
HCT: 46.1 % (ref 39.0–52.0)
Hemoglobin: 16.8 g/dL (ref 13.0–17.0)
MCH: 32.7 pg (ref 26.0–34.0)
MCHC: 36.4 g/dL — ABNORMAL HIGH (ref 30.0–36.0)
MCV: 89.7 fL (ref 78.0–100.0)
PLATELETS: 135 10*3/uL — AB (ref 150–400)
RBC: 5.14 MIL/uL (ref 4.22–5.81)
RDW: 12.2 % (ref 11.5–15.5)
WBC: 7.3 10*3/uL (ref 4.0–10.5)

## 2015-09-19 LAB — I-STAT CG4 LACTIC ACID, ED: LACTIC ACID, VENOUS: 1.59 mmol/L (ref 0.5–2.0)

## 2015-09-19 LAB — URINALYSIS, ROUTINE W REFLEX MICROSCOPIC
BILIRUBIN URINE: NEGATIVE
Glucose, UA: 250 mg/dL — AB
KETONES UR: NEGATIVE mg/dL
Leukocytes, UA: NEGATIVE
Nitrite: NEGATIVE
PH: 7 (ref 5.0–8.0)
Protein, ur: >300 mg/dL — AB
SPECIFIC GRAVITY, URINE: 1.021 (ref 1.005–1.030)

## 2015-09-19 LAB — URINE MICROSCOPIC-ADD ON: WBC UA: NONE SEEN WBC/hpf (ref 0–5)

## 2015-09-19 LAB — LIPASE, BLOOD: Lipase: 29 U/L (ref 11–51)

## 2015-09-19 MED ORDER — ONDANSETRON HCL 4 MG/2ML IJ SOLN
4.0000 mg | Freq: Once | INTRAMUSCULAR | Status: AC | PRN
  Administered 2015-09-19: 4 mg via INTRAVENOUS
  Filled 2015-09-19: qty 2

## 2015-09-19 MED ORDER — DEXTROSE 5 % IV SOLN
1.0000 g | Freq: Once | INTRAVENOUS | Status: AC
  Administered 2015-09-19: 1 g via INTRAVENOUS
  Filled 2015-09-19: qty 10

## 2015-09-19 MED ORDER — METHYLPREDNISOLONE SODIUM SUCC 125 MG IJ SOLR
125.0000 mg | Freq: Once | INTRAMUSCULAR | Status: AC
  Administered 2015-09-19: 125 mg via INTRAVENOUS
  Filled 2015-09-19: qty 2

## 2015-09-19 MED ORDER — AZITHROMYCIN 250 MG PO TABS
500.0000 mg | ORAL_TABLET | Freq: Once | ORAL | Status: AC
  Administered 2015-09-19: 500 mg via ORAL
  Filled 2015-09-19: qty 2

## 2015-09-19 MED ORDER — SODIUM CHLORIDE 0.9 % IV BOLUS (SEPSIS)
1000.0000 mL | Freq: Once | INTRAVENOUS | Status: AC
  Administered 2015-09-19: 1000 mL via INTRAVENOUS

## 2015-09-19 MED ORDER — ALBUTEROL SULFATE (2.5 MG/3ML) 0.083% IN NEBU
5.0000 mg | INHALATION_SOLUTION | Freq: Once | RESPIRATORY_TRACT | Status: AC
  Administered 2015-09-19: 5 mg via RESPIRATORY_TRACT
  Filled 2015-09-19: qty 6

## 2015-09-19 MED ORDER — IPRATROPIUM-ALBUTEROL 0.5-2.5 (3) MG/3ML IN SOLN
3.0000 mL | Freq: Once | RESPIRATORY_TRACT | Status: AC
  Administered 2015-09-19: 3 mL via RESPIRATORY_TRACT
  Filled 2015-09-19: qty 3

## 2015-09-19 MED ORDER — ACETAMINOPHEN 500 MG PO TABS
1000.0000 mg | ORAL_TABLET | Freq: Once | ORAL | Status: AC | PRN
  Administered 2015-09-19: 1000 mg via ORAL
  Filled 2015-09-19: qty 2

## 2015-09-19 NOTE — ED Notes (Signed)
Pt states that he has been having cough, difficulty breathing, fever, chills and vomiting for 3 days; pt states that it has been progressively getting worse; after walking to triage room pt's O2 sats were initially 84%; pt c/o generalized pain

## 2015-09-19 NOTE — H&P (Signed)
History and Physical  Patient Name: Duane Cuevas     [id number]    DOB: 1955/10/05    DOA: 09/19/2015 Referring physician: Quintella Reichert, MD PCP: No primary care provider on file.      Chief Complaint: Cough, fever  HPI: Duane Cuevas is a 60 y.o. male with a past medical history significant for emphysema who presents with fever and worsening cough.  Telephonic interpreter offered but the patient's daughter requests to act as his interpreter.  The patient reports that he was in his usual state of health until about a week ago when he started to develop URI symptoms of cough, congestion.  This persisted until three days ago when he started to get worse cough, fever, chest tightness, HA, vomiting, and dizziness with coughing.  These symptoms worsened today, and so he came to the ER.  In the ED, he had a low-grade fever, tachycardia, and was hypoxic to 87% on ambient air.  Na 130, K3.7, creatinine 0.9, total bilirubin and transaminases normal, WBC 7.3K, Hgb 16, platelets 135. Lactate normal. Urinalysis with proteinuria only. Chest x-ray showed developing right base opacity. Antibiotics were administered and TRH was asked to evaluate for admission.     Review of Systems:  All other systems negative except as just noted or noted in the history of present illness.  No Known Allergies  Prior to Admission medications   Medication Sig Start Date End Date Taking? Authorizing Provider  albuterol (PROVENTIL) (2.5 MG/3ML) 0.083% nebulizer solution Take 6 mLs (5 mg total) by nebulization every 6 (six) hours as needed for wheezing or shortness of breath. 03/03/15  Yes Etta Quill, NP  HYDROcodone-acetaminophen (NORCO) 5-325 MG tablet Take 1 tablet by mouth every 4 (four) hours as needed. Patient not taking: Reported on 09/19/2015 04/08/15   Daleen Bo, MD  metFORMIN (GLUCOPHAGE) 500 MG tablet Take 1 tablet (500 mg total) by mouth 2 (two) times daily with a meal. Patient not taking:  Reported on 03/03/2015 10/15/14   Robbie Lis, MD  naproxen (NAPROSYN) 375 MG tablet Take 1 tablet (375 mg total) by mouth 2 (two) times daily. Patient not taking: Reported on 03/03/2015 12/14/14   Elnora Morrison, MD  predniSONE (DELTASONE) 10 MG tablet Take 1 tablet (10 mg total) by mouth once. Patient not taking: Reported on 09/19/2015 04/08/15   Daleen Bo, MD    Past Medical History  Diagnosis Date  . COPD (chronic obstructive pulmonary disease) ([place])   . Emphysema/COPD ([place])   . Emphysema lung [facility])     Past Surgical History  Procedure Laterality Date  . Foot surgery      Family history: family history includes Diabetes in his mother; Emphysema in his father and mother.  Social History: Patient lives With his family. He works as a Theme park manager. He smokes daily.       Physical Exam: BP 104/80 mmHg  Pulse 114  Temp(Src) 100 F (37.8 C) (Oral)  Resp 31  SpO2 93% General appearance: Well-developed, adult male, alert and in no acute distress.  With nasal cannula, sitting comfortably in bed.   Eyes: Anicteric, conjunctiva pink, lids and lashes normal.     ENT: No nasal deformity, discharge, or epistaxis.  OP moist without lesions.   Lymph: No cervical or supraclavicular lymphadenopathy. Skin: Warm and dry.   Cardiac: Tachycardic, regular, nl S1-S2, no murmurs appreciated.  Capillary refill is brisk.  No JVD.  No edema. Respiratory: Tachypnea.  There are diminished breath sounds in R base.  No  rales appreciated.  Prolonged expiratory breath sounds. Abdomen: Abdomen soft without rigidity.  No TTP. No ascites, distension.   MSK: No deformities or effusions. Neuro: Sensorium intact and responding to questions, attention normal.  Speech is fluent.  Moves all extremities equally and with normal coordination.    Psych: Behavior appropriate.  Affect normal.  No evidence of aural or visual hallucinations or delusions.       Labs on Admission:  The metabolic panel shows hyponatremia,  normal renal function. Transaminases and bilirubin are normal. Lipase normal. Lactate normal. Urinalysis is clear except for proteinuria. The complete blood count shows no leukocytosis or anemia, mild thrombocytopenia.   Radiological Exams on Admission: Personally reviewed: Dg Chest 2 View  09/19/2015  CLINICAL DATA:  Acute onset of cough, difficulty breathing, fever, chills and vomiting. Decreased O2 saturation. Initial encounter. EXAM: CHEST  2 VIEW COMPARISON:  Chest radiograph performed 03/03/2015 FINDINGS: The lungs are hyperexpanded, with flattening of the hemidiaphragms. Vascular congestion is noted, with mild peribronchial thickening. Mild right basilar opacity may reflect mild pneumonia. There is no evidence of pleural effusion or pneumothorax. The heart is normal in size; the mediastinal contour is within normal limits. No acute osseous abnormalities are seen. IMPRESSION: Findings of COPD, with mild underlying vascular congestion. Mild right basilar opacity may reflect mild pneumonia. Electronically Signed   By: Garald Balding M.D.   On: 09/19/2015 21:58    EKG: Independently reviewed. Rate 118, sinus rhythm. QTC normal. Biatrial enlargement and RVH.    Assessment/Plan 1. Acute hypoxic respiratory failure from CAP:  This is new.  The patient meets SIRS criteria but I believe his hypoxia is from COPD flare and that he has no actual end organ damage.   -Levofloxacin given smoker and underlying emphysema -Sputum culture and gram stain -HIV ab and Strep pneumo testing -Incentive spirometry, supplemental O2  -Follow flu panel   2. Emphysema:  -Smoking cessation recommended -Nursing smoking cessation counseling -Albuterol scheduled overnight -Prednisone 40 mg PO for five days      DVT PPx: Lovenox Diet: Regular Consultants: None Code Status: FULL Family Communication: Daughter, present at bedside  Medical decision making: What exists of the patient's previous chart was  reviewed in depth and the case was discussed with Dr. Ralene Bathe. Patient seen 11:49 PM on 09/19/2015.  Disposition Plan:  I recommend admission to medical surgical bed, inpatient status.  Clinical condition: stable.  Anticipate IV therapy for 24-48 hours, transition to oral and discharge when HR < 100, SpO2 improves.      Edwin Dada [facility] Pager [phone]

## 2015-09-19 NOTE — ED Provider Notes (Signed)
CSN: [id number]     Arrival date & time 09/19/15  2004 History   First MD Initiated Contact with Patient 09/19/15 2139     Chief Complaint  Patient presents with  . Respiratory Distress    The history is provided by the patient. No language interpreter was used.   Duane Cuevas is a 60 y.o. male who presents to the Emergency Department complaining of SOB, fevers.  Hx is provided by the patient and his daughter.  They report three days of chills, fever to 104, cough, sob.  He has chest pain with coughing, abdominal pain with coughing.  He has significant nausea and decreased oral intake with occasional vomiting.  He has intermittent small volume hemoptysis since yesterday.  He denies any lower extremity swelling or pain.  He has a hx/o emphysema and has ongoing chronic intermittent hemoptysis.  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  Sxs are severe, constant, worsening.    Past Medical History  Diagnosis Date  . COPD (chronic obstructive pulmonary disease) ([place])   . Emphysema/COPD ([place])   . Emphysema lung [facility])    Past Surgical History  Procedure Laterality Date  . Foot surgery     Family History  Problem Relation Age of Onset  . Emphysema Mother   . Emphysema Father   . Diabetes Mother    Social History  Substance Use Topics  . Smoking status: Current Every Day Smoker -- 1.00 packs/day    Types: Cigarettes  . Smokeless tobacco: Never Used  . Alcohol Use: 3.6 oz/week    6 Cans of beer per week    Review of Systems  All other systems reviewed and are negative.     Allergies  Review of patient's allergies indicates no known allergies.  Home Medications   Prior to Admission medications   Medication Sig Start Date End Date Taking? Authorizing Provider  albuterol (PROVENTIL) (2.5 MG/3ML) 0.083% nebulizer solution Take 6 mLs (5 mg total) by nebulization every 6 (six) hours as needed for wheezing or shortness of breath. 03/03/15  Yes Etta Quill, NP   HYDROcodone-acetaminophen (NORCO) 5-325 MG tablet Take 1 tablet by mouth every 4 (four) hours as needed. Patient not taking: Reported on 09/19/2015 04/08/15   Daleen Bo, MD  metFORMIN (GLUCOPHAGE) 500 MG tablet Take 1 tablet (500 mg total) by mouth 2 (two) times daily with a meal. Patient not taking: Reported on 03/03/2015 10/15/14   Robbie Lis, MD  naproxen (NAPROSYN) 375 MG tablet Take 1 tablet (375 mg total) by mouth 2 (two) times daily. Patient not taking: Reported on 03/03/2015 12/14/14   Elnora Morrison, MD  predniSONE (DELTASONE) 10 MG tablet Take 1 tablet (10 mg total) by mouth once. Patient not taking: Reported on 09/19/2015 04/08/15   Daleen Bo, MD   BP 104/80 mmHg  Pulse 114  Temp(Src) 100 F (37.8 C) (Oral)  Resp 31  SpO2 93% Physical Exam  Constitutional: He is oriented to person, place, and time. He appears well-developed and well-nourished.  HENT:  Head: Normocephalic and atraumatic.  Cardiovascular: Regular rhythm.   No murmur heard. Tachycardic.    Pulmonary/Chest: Effort normal. No respiratory distress.  Tachypnea with decreased air movement bilaterally with occasional end expiratory wheezes.    Abdominal: Soft. There is no tenderness. There is no rebound and no guarding.  Musculoskeletal: He exhibits no edema or tenderness.  Neurological: He is alert and oriented to person, place, and time.  Skin: Skin is warm and dry.  Psychiatric:  He has a normal mood and affect. His behavior is normal.  Nursing note and vitals reviewed.   ED Course  Procedures (including critical care time) Labs Review Labs Reviewed  CBC - Abnormal; Notable for the following:    MCHC 36.4 (*)    Platelets 135 (*)    All other components within normal limits  COMPREHENSIVE METABOLIC PANEL - Abnormal; Notable for the following:    Sodium 130 (*)    Chloride 92 (*)    Glucose, Bld 179 (*)    Calcium 8.5 (*)    All other components within normal limits  URINALYSIS, ROUTINE W REFLEX  MICROSCOPIC (NOT AT [facility]) - Abnormal; Notable for the following:    Glucose, UA 250 (*)    Hgb urine dipstick MODERATE (*)    Protein, ur >300 (*)    All other components within normal limits  URINE MICROSCOPIC-ADD ON - Abnormal; Notable for the following:    Squamous Epithelial / LPF 0-5 (*)    Bacteria, UA RARE (*)    All other components within normal limits  LIPASE, BLOOD  INFLUENZA PANEL BY PCR (TYPE A & B, H1N1)  I-STAT CG4 LACTIC ACID, ED    Imaging Review Dg Chest 2 View  09/19/2015  CLINICAL DATA:  Acute onset of cough, difficulty breathing, fever, chills and vomiting. Decreased O2 saturation. Initial encounter. EXAM: CHEST  2 VIEW COMPARISON:  Chest radiograph performed 03/03/2015 FINDINGS: The lungs are hyperexpanded, with flattening of the hemidiaphragms. Vascular congestion is noted, with mild peribronchial thickening. Mild right basilar opacity may reflect mild pneumonia. There is no evidence of pleural effusion or pneumothorax. The heart is normal in size; the mediastinal contour is within normal limits. No acute osseous abnormalities are seen. IMPRESSION: Findings of COPD, with mild underlying vascular congestion. Mild right basilar opacity may reflect mild pneumonia. Electronically Signed   By: Garald Balding M.D.   On: 09/19/2015 21:58   I have personally reviewed and evaluated these images and lab results as part of my medical decision-making.   EKG Interpretation   Date/Time:  Saturday September 19 2015 21:25:08 EDT Ventricular Rate:  114 PR Interval:  113 QRS Duration: 86 QT Interval:  315 QTC Calculation: 434 R Axis:   89 Text Interpretation:  Sinus tachycardia Biatrial enlargement Consider  right ventricular hypertrophy Baseline wander in lead(s) V5 Confirmed by  Hazle Coca [phone]) on 09/19/2015 9:40:27 PM      MDM   Final diagnoses:  CAP (community acquired pneumonia)    Patient with history of emphysema here with increased shortness of breath. Chest x-ray  is concerning for pneumonia. Treating for community acquired pneumonia giving her symptoms. Also treating for COPD exacerbation given his underlying lung disease, provided albuterol, Solu-Medrol. Patient does have hypoxia in the department, plan to admit for further treatment.    Quintella Reichert, MD 09/20/15 [phone]

## 2015-09-20 LAB — BASIC METABOLIC PANEL
ANION GAP: 9 (ref 5–15)
BUN: 9 mg/dL (ref 6–20)
CALCIUM: 8.4 mg/dL — AB (ref 8.9–10.3)
CO2: 22 mmol/L (ref 22–32)
Chloride: 101 mmol/L (ref 101–111)
Creatinine, Ser: 0.75 mg/dL (ref 0.61–1.24)
GFR calc Af Amer: >60 mL/min (ref >60)
GLUCOSE: 264 mg/dL — AB (ref 65–99)
POTASSIUM: 3.9 mmol/L (ref 3.5–5.1)
SODIUM: 132 mmol/L — AB (ref 135–145)

## 2015-09-20 LAB — CBC
HCT: 44.7 % (ref 39.0–52.0)
Hemoglobin: 16.4 g/dL (ref 13.0–17.0)
MCH: 33.1 pg (ref 26.0–34.0)
MCHC: 36.7 g/dL — ABNORMAL HIGH (ref 30.0–36.0)
MCV: 90.1 fL (ref 78.0–100.0)
PLATELETS: 122 10*3/uL — AB (ref 150–400)
RBC: 4.96 MIL/uL (ref 4.22–5.81)
RDW: 12.3 % (ref 11.5–15.5)
WBC: 11.3 10*3/uL — AB (ref 4.0–10.5)

## 2015-09-20 LAB — INFLUENZA PANEL BY PCR (TYPE A & B)
H1N1 flu by pcr: NOT DETECTED
Influenza A By PCR: NEGATIVE
Influenza B By PCR: NEGATIVE

## 2015-09-20 LAB — HIV ANTIBODY (ROUTINE TESTING W REFLEX): HIV SCREEN 4TH GENERATION: NONREACTIVE

## 2015-09-20 MED ORDER — ACETAMINOPHEN 325 MG PO TABS: 650.0000 mg | ORAL_TABLET | Freq: Four times a day (QID) | ORAL | Status: DC | PRN

## 2015-09-20 MED ORDER — ACETAMINOPHEN 650 MG RE SUPP: 650.0000 mg | Freq: Four times a day (QID) | RECTAL | Status: DC | PRN

## 2015-09-20 MED ORDER — ONDANSETRON HCL 4 MG PO TABS: 4.0000 mg | ORAL_TABLET | Freq: Four times a day (QID) | ORAL | Status: DC | PRN

## 2015-09-20 MED ORDER — IPRATROPIUM-ALBUTEROL 0.5-2.5 (3) MG/3ML IN SOLN
3.0000 mL | Freq: Three times a day (TID) | RESPIRATORY_TRACT | Status: DC
  Administered 2015-09-21 (×2): 3 mL via RESPIRATORY_TRACT
  Filled 2015-09-20 (×2): qty 3

## 2015-09-20 MED ORDER — LEVOFLOXACIN IN D5W 750 MG/150ML IV SOLN
750.0000 mg | INTRAVENOUS | Status: DC
  Administered 2015-09-20: 750 mg via INTRAVENOUS
  Filled 2015-09-20: qty 150

## 2015-09-20 MED ORDER — ALBUTEROL SULFATE (2.5 MG/3ML) 0.083% IN NEBU: 2.5000 mg | INHALATION_SOLUTION | RESPIRATORY_TRACT | Status: DC | PRN

## 2015-09-20 MED ORDER — INFLUENZA VAC SPLIT QUAD 0.5 ML IM SUSY
0.5000 mL | PREFILLED_SYRINGE | INTRAMUSCULAR | Status: AC
  Administered 2015-09-21: 0.5 mL via INTRAMUSCULAR
  Filled 2015-09-20 (×3): qty 0.5

## 2015-09-20 MED ORDER — ALBUTEROL SULFATE (2.5 MG/3ML) 0.083% IN NEBU: 2.5000 mg | INHALATION_SOLUTION | Freq: Four times a day (QID) | RESPIRATORY_TRACT | Status: DC

## 2015-09-20 MED ORDER — ENOXAPARIN SODIUM 40 MG/0.4ML ~~LOC~~ SOLN
40.0000 mg | SUBCUTANEOUS | Status: DC
  Administered 2015-09-20 – 2015-09-21 (×2): 40 mg via SUBCUTANEOUS
  Filled 2015-09-20 (×2): qty 0.4

## 2015-09-20 MED ORDER — PNEUMOCOCCAL VAC POLYVALENT 25 MCG/0.5ML IJ INJ
0.5000 mL | INJECTION | INTRAMUSCULAR | Status: AC
Start: 2015-09-21 — End: 2015-09-21
  Administered 2015-09-21: 0.5 mL via INTRAMUSCULAR
  Filled 2015-09-20 (×3): qty 0.5

## 2015-09-20 MED ORDER — SODIUM CHLORIDE 0.9 % IV SOLN
INTRAVENOUS | Status: DC
  Administered 2015-09-20: 1000 mL via INTRAVENOUS
  Administered 2015-09-20: 01:00:00 via INTRAVENOUS
  Administered 2015-09-20: 1000 mL via INTRAVENOUS
  Administered 2015-09-21: 10:00:00 via INTRAVENOUS

## 2015-09-20 MED ORDER — ONDANSETRON HCL 4 MG/2ML IJ SOLN: 4.0000 mg | Freq: Four times a day (QID) | INTRAMUSCULAR | Status: DC | PRN

## 2015-09-20 MED ORDER — PREDNISONE 20 MG PO TABS
40.0000 mg | ORAL_TABLET | Freq: Every day | ORAL | Status: DC
  Administered 2015-09-20 – 2015-09-21 (×2): 40 mg via ORAL
  Filled 2015-09-20 (×2): qty 2

## 2015-09-20 MED ORDER — IPRATROPIUM-ALBUTEROL 0.5-2.5 (3) MG/3ML IN SOLN
3.0000 mL | Freq: Four times a day (QID) | RESPIRATORY_TRACT | Status: DC
  Administered 2015-09-20 (×3): 3 mL via RESPIRATORY_TRACT
  Filled 2015-09-20 (×3): qty 3

## 2015-09-20 MED ORDER — GUAIFENESIN ER 600 MG PO TB12
1200.0000 mg | ORAL_TABLET | Freq: Two times a day (BID) | ORAL | Status: DC
  Administered 2015-09-20 – 2015-09-21 (×3): 1200 mg via ORAL
  Filled 2015-09-20 (×3): qty 2

## 2015-09-20 NOTE — Plan of Care (Signed)
Problem: Activity: Goal: Risk for activity intolerance will decrease Outcome: Progressing Patient ambulated in the hallway this am with standby assist.

## 2015-09-20 NOTE — Progress Notes (Signed)
Patient Demographics  Duane Cuevas, is a 60 y.o. male, DOB - [birth year], [id number]  Admit date - 09/19/2015   Admitting Physician Edwin Dada, MD  Outpatient Primary MD for the patient is No primary care provider on file.  LOS - 1   Chief Complaint  Patient presents with  . Respiratory Distress       Admission HPI/Brief narrative: 60 y.o. male with a past medical history significant for emphysema who presents with fever and worsening cough, workup significant for CAP and COPD exacerbation. Subjective:   Duane Cuevas today has, No headache, No chest pain, No abdominal pain - No Nausea, report generalized weakness , and productive cough . Assessment & Plan    Active Problems:   COPD (chronic obstructive pulmonary disease) (H[facility])   CAP (community acquired pneumonia)  Acute hypoxic respiratory failure from CAP and COPD exacerbation . - Continue to treat COPD and CAP. - Continue with pulmonary toilet, nebs, oxygen as needed, wean as tolerated  CAP - Chest x-ray with evidence of right basilar opacity - Continue with levofloxacin - Follow on Sputum culture and gram stain - Follow on HIV ab and Strep pneumo testing -  flu panel negative    COPD exacerbation - Continue with nebs as needed, continue with by mouth prednisone.  Tobacco abuse  - Patient was counseled .  Code Status: Full  Family Communication: none at bedside  Disposition Plan: home when stable   Procedures  none   Consults   none   Medications  Scheduled Meds: . enoxaparin (LOVENOX) injection  40 mg Subcutaneous Q24H  . guaiFENesin  1,200 mg Oral BID  . [START ON 09/21/2015] Influenza vac split quadrivalent PF  0.5 mL Intramuscular Tomorrow-1000  . ipratropium-albuterol  3 mL Nebulization Q6H  . levofloxacin (LEVAQUIN) IV  750 mg Intravenous Q24H  . predniSONE  40 mg Oral Q breakfast    Continuous Infusions: . sodium chloride 1,000 mL (09/20/15 1033)   PRN Meds:.acetaminophen **OR** acetaminophen, albuterol, ondansetron **OR** ondansetron (ZOFRAN) IV  DVT Prophylaxis  Lovenox  Lab Results  Component Value Date   PLT 122* 09/20/2015    Antibiotics    Anti-infectives    Start     Dose/Rate Route Frequency Ordered Stop   09/20/15 2000  levofloxacin (LEVAQUIN) IVPB 750 mg     750 mg 100 mL/hr over 90 Minutes Intravenous Every 24 hours 09/20/15 0111 09/25/15 1959   09/19/15 2200  cefTRIAXone (ROCEPHIN) 1 g in dextrose 5 % 50 mL IVPB     1 g 100 mL/hr over 30 Minutes Intravenous  Once 09/19/15 2158 09/19/15 2310   09/19/15 2200  azithromycin (ZITHROMAX) tablet 500 mg     500 mg Oral  Once 09/19/15 2158 09/19/15 2229          Objective:   Filed Vitals:   09/20/15 0051 09/20/15 0117 09/20/15 0540 09/20/15 0855  BP: 102/67 113/69 135/79   Pulse: 92 94 81 136  Temp: 99.1 F (37.3 C) 98.4 F (36.9 C) 96 F (35.6 C)   TempSrc: Oral Oral Axillary   Resp: 24 20 18 20   Height:  5\' 9"  (1.753 m)    Weight:  67.042 kg (147 lb 12.8 oz)    SpO2:  96% 95% 98% 93%    Wt Readings from Last 3 Encounters:  09/20/15 67.042 kg (147 lb 12.8 oz)  04/11/15 74.844 kg (165 lb)  12/14/14 72.576 kg (160 lb)     Intake/Output Summary (Last 24 hours) at 09/20/15 1436 Last data filed at 09/20/15 1404  Gross per 24 hour  Intake   1160 ml  Output   2100 ml  Net   -940 ml     Physical Exam  Awake Alert, Oriented X 3, No new F.N deficits, Normal affect [place].AT,PERRAL Supple Neck,No JVD, No cervical lymphadenopathy appriciated.  Symmetrical Chest wall movement, Good air movement bilaterally, minimal wheezing RRR,No Gallops,Rubs or new Murmurs, No Parasternal Heave +ve B.Sounds, Abd Soft, No tenderness, No organomegaly appriciated, No rebound - guarding or rigidity. No Cyanosis, Clubbing or edema, No new Rash or bruise     Data Review   Micro Results No results  found for this or any previous visit (from the past 240 hour(s)).  Radiology Reports Dg Chest 2 View  09/19/2015  CLINICAL DATA:  Acute onset of cough, difficulty breathing, fever, chills and vomiting. Decreased O2 saturation. Initial encounter. EXAM: CHEST  2 VIEW COMPARISON:  Chest radiograph performed 03/03/2015 FINDINGS: The lungs are hyperexpanded, with flattening of the hemidiaphragms. Vascular congestion is noted, with mild peribronchial thickening. Mild right basilar opacity may reflect mild pneumonia. There is no evidence of pleural effusion or pneumothorax. The heart is normal in size; the mediastinal contour is within normal limits. No acute osseous abnormalities are seen. IMPRESSION: Findings of COPD, with mild underlying vascular congestion. Mild right basilar opacity may reflect mild pneumonia. Electronically Signed   By: Garald Balding M.D.   On: 09/19/2015 21:58     CBC  Recent Labs Lab 09/19/15 2149 09/20/15 0509  WBC 7.3 11.3*  HGB 16.8 16.4  HCT 46.1 44.7  PLT 135* 122*  MCV 89.7 90.1  MCH 32.7 33.1  MCHC 36.4* 36.7*  RDW 12.2 12.3    Chemistries   Recent Labs Lab 09/19/15 2149 09/20/15 0509  NA 130* 132*  K 3.7 3.9  CL 92* 101  CO2 26 22  GLUCOSE 179* 264*  BUN 9 9  CREATININE 0.91 0.75  CALCIUM 8.5* 8.4*  AST 32  --   ALT 25  --   ALKPHOS 53  --   BILITOT 0.4  --    ------------------------------------------------------------------------------------------------------------------ estimated creatinine clearance is 93.1 mL/min (by C-G formula based on Cr of 0.75). ------------------------------------------------------------------------------------------------------------------ No results for input(s): HGBA1C in the last 72 hours. ------------------------------------------------------------------------------------------------------------------ No results for input(s): CHOL, HDL, LDLCALC, TRIG, CHOLHDL, LDLDIRECT in the last 72  hours. ------------------------------------------------------------------------------------------------------------------ No results for input(s): TSH, T4TOTAL, T3FREE, THYROIDAB in the last 72 hours.  Invalid input(s): FREET3 ------------------------------------------------------------------------------------------------------------------ No results for input(s): VITAMINB12, FOLATE, FERRITIN, TIBC, IRON, RETICCTPCT in the last 72 hours.  Coagulation profile No results for input(s): INR, PROTIME in the last 168 hours.  No results for input(s): DDIMER in the last 72 hours.  Cardiac Enzymes No results for input(s): CKMB, TROPONINI, MYOGLOBIN in the last 168 hours.  Invalid input(s): CK ------------------------------------------------------------------------------------------------------------------ Invalid input(s): POCBNP     Time Spent in minutes   30 minutes   Aiden Rao M.D on 09/20/2015 at 2:36 PM  Between 7am to 7pm - Pager - [phone]  After 7pm go to [email or web address] - password [facility]  [facility]   Office  [phone]

## 2015-09-20 NOTE — Progress Notes (Addendum)
Patient arrived on the unit at approximately 1258. He is alert and verbally responsive and speaks limited [place]. Has a productive cough and currently has no acute respiratory problems. Will maintain Droplet isolation as ordered.

## 2015-09-20 NOTE — Progress Notes (Signed)
Per Mickel Baas infection prevention that we can d/c droplet precaution.

## 2015-09-20 NOTE — Progress Notes (Signed)
Utilization Review Completed.Rihaan Barrack T3/19/2017  

## 2015-09-21 LAB — CBC
HCT: 42.2 % (ref 39.0–52.0)
Hemoglobin: 14.4 g/dL (ref 13.0–17.0)
MCH: 31.9 pg (ref 26.0–34.0)
MCHC: 34.1 g/dL (ref 30.0–36.0)
MCV: 93.4 fL (ref 78.0–100.0)
PLATELETS: 127 10*3/uL — AB (ref 150–400)
RBC: 4.52 MIL/uL (ref 4.22–5.81)
RDW: 12.4 % (ref 11.5–15.5)
WBC: 14.5 10*3/uL — AB (ref 4.0–10.5)

## 2015-09-21 LAB — BASIC METABOLIC PANEL
ANION GAP: 8 (ref 5–15)
BUN: 15 mg/dL (ref 6–20)
CHLORIDE: 104 mmol/L (ref 101–111)
CO2: 23 mmol/L (ref 22–32)
Calcium: 8.3 mg/dL — ABNORMAL LOW (ref 8.9–10.3)
Creatinine, Ser: 0.71 mg/dL (ref 0.61–1.24)
GFR calc Af Amer: >60 mL/min (ref >60)
GFR calc non Af Amer: >60 mL/min (ref >60)
GLUCOSE: 200 mg/dL — AB (ref 65–99)
POTASSIUM: 3.6 mmol/L (ref 3.5–5.1)
SODIUM: 135 mmol/L (ref 135–145)

## 2015-09-21 LAB — STREP PNEUMONIAE URINARY ANTIGEN: STREP PNEUMO URINARY ANTIGEN: NEGATIVE

## 2015-09-21 MED ORDER — LEVOFLOXACIN 750 MG PO TABS: 750.0000 mg | ORAL_TABLET | Freq: Every day | ORAL | Status: DC

## 2015-09-21 MED ORDER — METFORMIN HCL 500 MG PO TABS: 500.0000 mg | ORAL_TABLET | Freq: Two times a day (BID) | ORAL | Status: DC

## 2015-09-21 MED ORDER — PREDNISONE 10 MG PO TABS: ORAL_TABLET | ORAL | Status: DC

## 2015-09-21 NOTE — Care Management Note (Signed)
Case Management Note  Patient Details  Name: Kastin Zambo MRN: [id number] Date of Birth: [birth year]  Subjective/Objective:   Admitted with CAP and COPD exacerbation                 Action/Plan: Discharge planning, received a call from bedside nurse that patient needed an appt with [facility] for hospital follow up. Contacted clinic but they did not have any appts available and states the patient would have to call back in am to try again. Provided contact information to patient to schedule appt.   Expected Discharge Date:   (unkown)               Expected Discharge Plan:  Home/Self Care  In-House Referral:  Interpreting Services  Discharge planning Services  CM Consult, Follow-up appt scheduled  Post Acute Care Choice:  NA Choice offered to:  NA  DME Arranged:  N/A DME Agency:  NA  HH Arranged:  NA HH Agency:  NA  Status of Service:  Completed, signed off  Medicare Important Message Given:    Date Medicare IM Given:    Medicare IM give by:    Date Additional Medicare IM Given:    Additional Medicare Important Message give by:     If discussed at [place] of Stay Meetings, dates discussed:    Additional Comments:  Guadalupe Maple, RN 09/21/2015, 1:01 PM [phone]

## 2015-09-21 NOTE — Progress Notes (Signed)
Inpatient Diabetes Program Recommendations  AACE/ADA: New Consensus Statement on Inpatient Glycemic Control (2015)  Target Ranges:  Prepandial:   less than 140 mg/dL      Peak postprandial:   less than 180 mg/dL (1-2 hours)      Critically ill patients:  140 - 180 mg/dL   Results for Duane Cuevas, IMMERMAN (MRN [id number]) as of 09/21/2015 09:18  Ref. Range 09/19/2015 21:49 09/20/2015 05:09 09/21/2015 03:53  Glucose Latest Ref Range: 65-99 mg/dL 179 (H) 264 (H) 200 (H)    Admit with: COPD/ Pneumonia  History: DM (diagnosed in April 2016)  Home DM Meds: Metformin 500 mg bid (patient not taking)  Current Insulin Orders: None      MD- Through review of the Chart Review tab of EPIC, note patient was admitted to the hospital on 10/14/14 with new diagnosis of DM.  Was discharged home with Rx for Metformin 500 mg bid by Dr. Charlies Silvers.  Patient was supposed to [facility] the [facility] but I do not see in the records that he did.  Note patient currently getting Prednisone 40 mg daily.  Lab glucose levels elevated.   MD- Since patient has a History of DM Type 2, please consider placing order for Novolog Moderate Correction Scale/ SSI (0-15 units) TID AC + HS    --Will follow patient during hospitalization--  [name] RN, MSN, CDE Diabetes Coordinator Inpatient Glycemic Control Team Team Pager: [phone] (8a-5p)

## 2015-09-21 NOTE — Discharge Summary (Signed)
Duane Cuevas, is a 60 y.o. male  DOB Sep 12, 1955  MRN [id number].  Admission date:  09/19/2015  Admitting Physician  Edwin Dada, MD  Discharge Date:  09/21/2015   Primary MD  No primary care provider on file.  Recommendations for primary care physician for things to follow:  - Please check CBC, BMP during next visit - Check 2 view chest x-ray 2 weeks - Please follow on glycohemoglobin A1c results   Admission Diagnosis  CAP (community acquired pneumonia) [J18.9]   Discharge Diagnosis  CAP (community acquired pneumonia) [J18.9]    Active Problems:   COPD (chronic obstructive pulmonary disease) ([place])   CAP (community acquired pneumonia)      Past Medical History  Diagnosis Date  . COPD (chronic obstructive pulmonary disease) ([place])   . Emphysema/COPD ([place])   . Emphysema lung [facility])     Past Surgical History  Procedure Laterality Date  . Foot surgery         History of present illness and  Hospital Course:     Kindly see H&P for history of present illness and admission details, please review complete Labs, Consult reports and Test reports for all details in brief  HPI  from the history and physical done on the day of admission 09/19/2015  : Duane Cuevas is a 60 y.o. male with a past medical history significant for emphysema who presents with fever and worsening cough.  Telephonic interpreter offered but the patient's daughter requests to act as his interpreter. The patient reports that he was in his usual state of health until about a week ago when he started to develop URI symptoms of cough, congestion. This persisted until three days ago when he started to get worse cough, fever, chest tightness, HA, vomiting, and dizziness with coughing. These symptoms worsened today, and so he came to the ER.  In the ED, he had a low-grade fever, tachycardia, and was hypoxic to 87%  on ambient air. Na 130, K3.7, creatinine 0.9, total bilirubin and transaminases normal, WBC 7.3K, Hgb 16, platelets 135. Lactate normal. Urinalysis with proteinuria only. Chest x-ray showed developing right base opacity. Antibiotics were administered and TRH was asked to evaluate for admission.      Hospital Course   Acute hypoxic respiratory failure from CAP and COPD exacerbation . - Continue to COPD and CAP. - Resolved, currently on room air  CAP - Chest x-ray with evidence of right basilar opacity - Treated with 1 day of IV Rocephin and azithromycin, then started on levofloxacin, one day while inpatient, to finish another 3 days as an outside - HIV nonreactive, strep pneumonia negative - flu panel negative   COPD exacerbation - Treated with by mouth prednisone during hospital stay, to continue steroid taper over next 3 days  Tobacco abuse  - Patient was counseled   Diabetes mellitus - Patient was discharged last time was on metformin, not taking anymore, resume back on metformin and instructed to schedule an appointment with: Wellness clinic, glycohemoglobin A1c will be sent  prior to Discharge, please follow results as an outpatient.   Discharge Condition:  Stable   Follow UP  Follow-up Information    Schedule an appointment as soon as possible for a visit with [place].   Why:  for follow up from hospital stay and diabetes   Contact information:   [address] [place] [phone]        Discharge Instructions  and  Discharge Medications     Discharge Instructions    Discharge instructions    Complete by:  As directed   Please schedule an appointment with: Wellness clinic in one week Get CBC, CMP, 2 view Chest X ray checked  by Primary MD next visit.    Activity: As tolerated with Full fall precautions use walker/cane & assistance as needed   Disposition Home    Diet: Heart Healthy  , carbohydrate modified , with feeding assistance and aspiration precautions.  For Heart failure patients - Check your Weight same time everyday, if you gain over 2 pounds, or you develop in leg swelling, experience more shortness of breath or chest pain, call your Primary MD immediately. Follow Cardiac Low Salt Diet and 1.5 lit/day fluid restriction.   On your next visit with your primary care physician please Get Medicines reviewed and adjusted.   Please request your Prim.MD to go over all Hospital Tests and Procedure/Radiological results at the follow up, please get all Hospital records sent to your Prim MD by signing hospital release before you go home.   If you experience worsening of your admission symptoms, develop shortness of breath, life threatening emergency, suicidal or homicidal thoughts you must seek medical attention immediately by calling 911 or calling your MD immediately  if symptoms less severe.  You Must read complete instructions/literature along with all the possible adverse reactions/side effects for all the Medicines you take and that have been prescribed to you. Take any new Medicines after you have completely understood and accpet all the possible adverse reactions/side effects.   Do not drive, operating heavy machinery, perform activities at heights, swimming or participation in water activities or provide baby sitting services if your were admitted for syncope or siezures until you have seen by Primary MD or a Neurologist and advised to do so again.  Do not drive when taking Pain medications.    Do not take more than prescribed Pain, Sleep and Anxiety Medications  Special Instructions: If you have smoked or chewed Tobacco  in the last 2 yrs please stop smoking, stop any regular Alcohol  and or any Recreational drug use.  Wear Seat belts while driving.   Please note  You were cared for by a hospitalist during your hospital stay. If you have any questions about  your discharge medications or the care you received while you were in the hospital after you are discharged, you can call the unit and asked to speak with the hospitalist on call if the hospitalist that took care of you is not available. Once you are discharged, your primary care physician will handle any further medical issues. Please note that NO REFILLS for any discharge medications will be authorized once you are discharged, as it is imperative that you return to your primary care physician (or establish a relationship with a primary care physician if you do not have one) for your aftercare needs so that they can reassess your need for medications and monitor your lab values.  Increase activity slowly    Complete by:  As directed             Medication List    STOP taking these medications        HYDROcodone-acetaminophen 5-325 MG tablet  Commonly known as:  NORCO     naproxen 375 MG tablet  Commonly known as:  NAPROSYN      TAKE these medications        albuterol (2.5 MG/3ML) 0.083% nebulizer solution  Commonly known as:  PROVENTIL  Take 6 mLs (5 mg total) by nebulization every 6 (six) hours as needed for wheezing or shortness of breath.     levofloxacin 750 MG tablet  Commonly known as:  LEVAQUIN  Take 1 tablet (750 mg total) by mouth daily. Please take for 3 days then stop     metFORMIN 500 MG tablet  Commonly known as:  GLUCOPHAGE  Take 1 tablet (500 mg total) by mouth 2 (two) times daily with a meal.     predniSONE 10 MG tablet  Commonly known as:  DELTASONE  Please take 30 mg for 1 day, then 20 mg for 1 day, then 10 mg for 1 day then stop.          Diet and Activity recommendation: See Discharge Instructions above   Consults obtained -  none   Major procedures and Radiology Reports - PLEASE review detailed and final reports for all details, in brief -      Dg Chest 2 View  09/19/2015  CLINICAL DATA:  Acute onset of cough, difficulty breathing,  fever, chills and vomiting. Decreased O2 saturation. Initial encounter. EXAM: CHEST  2 VIEW COMPARISON:  Chest radiograph performed 03/03/2015 FINDINGS: The lungs are hyperexpanded, with flattening of the hemidiaphragms. Vascular congestion is noted, with mild peribronchial thickening. Mild right basilar opacity may reflect mild pneumonia. There is no evidence of pleural effusion or pneumothorax. The heart is normal in size; the mediastinal contour is within normal limits. No acute osseous abnormalities are seen. IMPRESSION: Findings of COPD, with mild underlying vascular congestion. Mild right basilar opacity may reflect mild pneumonia. Electronically Signed   By: Garald Balding M.D.   On: 09/19/2015 21:58    Micro Results     No results found for this or any previous visit (from the past 240 hour(s)).     Today   Subjective:   Duane Cuevas today has no headache,no chest abdominal pain,no new weakness tingling or numbness, feels much better wants to go home today.   Objective:   Blood pressure 149/88, pulse 79, temperature 97.7 F (36.5 C), temperature source Oral, resp. rate 18, height 5\' 9"  (1.753 m), weight 67.042 kg (147 lb 12.8 oz), SpO2 100 %.   Intake/Output Summary (Last 24 hours) at 09/21/15 1327 Last data filed at 09/21/15 [id number]  Gross per 24 hour  Intake 3274.9 ml  Output   2225 ml  Net 1049.9 ml    Exam Awake Alert, Oriented x 3, No new F.N deficits, Normal affect [place].AT,PERRAL Supple Neck,No JVD, No cervical lymphadenopathy appriciated.  Symmetrical Chest wall movement, Good air movement bilaterally, CTAB RRR,No Gallops,Rubs or new Murmurs, No Parasternal Heave +ve B.Sounds, Abd Soft, Non tender, No organomegaly appriciated, No rebound -guarding or rigidity. No Cyanosis, Clubbing or edema, No new Rash or bruise  Data Review   CBC w Diff: Lab Results  Component Value Date   WBC 14.5* 09/21/2015   HGB 14.4 09/21/2015   HCT 42.2 09/21/2015  PLT 127*  09/21/2015    CMP: Lab Results  Component Value Date   NA 135 09/21/2015   K 3.6 09/21/2015   CL 104 09/21/2015   CO2 23 09/21/2015   BUN 15 09/21/2015   CREATININE 0.71 09/21/2015   PROT 7.3 09/19/2015   ALBUMIN 3.7 09/19/2015   BILITOT 0.4 09/19/2015   ALKPHOS 53 09/19/2015   AST 32 09/19/2015   ALT 25 09/19/2015  .   Total Time in preparing paper work, data evaluation and todays exam - 35 minutes  Americus Scheurich M.D on 09/21/2015 at 1:27 PM  [facility]   Office  [phone]

## 2015-09-21 NOTE — Progress Notes (Signed)
Pt discharged home with family in stable condition. Discharge instructions and script given. Pt and family verbalized understanding.  

## 2015-09-21 NOTE — Discharge Instructions (Signed)
Please schedule an appointment with [facility] wellness clinic in one week Get CBC, CMP, 2 view Chest X ray checked  by Primary MD next visit.    Activity: As tolerated with Full fall precautions use walker/cane & assistance as needed   Disposition Home    Diet: Heart Healthy , carbohydrate modified , with feeding assistance and aspiration precautions.  For Heart failure patients - Check your Weight same time everyday, if you gain over 2 pounds, or you develop in leg swelling, experience more shortness of breath or chest pain, call your Primary MD immediately. Follow Cardiac Low Salt Diet and 1.5 lit/day fluid restriction.   On your next visit with your primary care physician please Get Medicines reviewed and adjusted.   Please request your Prim.MD to go over all Hospital Tests and Procedure/Radiological results at the follow up, please get all Hospital records sent to your Prim MD by signing hospital release before you go home.   If you experience worsening of your admission symptoms, develop shortness of breath, life threatening emergency, suicidal or homicidal thoughts you must seek medical attention immediately by calling 911 or calling your MD immediately  if symptoms less severe.  You Must read complete instructions/literature along with all the possible adverse reactions/side effects for all the Medicines you take and that have been prescribed to you. Take any new Medicines after you have completely understood and accpet all the possible adverse reactions/side effects.   Do not drive, operating heavy machinery, perform activities at heights, swimming or participation in water activities or provide baby sitting services if your were admitted for syncope or siezures until you have seen by Primary MD or a Neurologist and advised to do so again.  Do not drive when taking Pain medications.    Do not take more than prescribed Pain, Sleep and Anxiety Medications  Special Instructions: If  you have smoked or chewed Tobacco  in the last 2 yrs please stop smoking, stop any regular Alcohol  and or any Recreational drug use.  Wear Seat belts while driving.   Please note  You were cared for by a hospitalist during your hospital stay. If you have any questions about your discharge medications or the care you received while you were in the hospital after you are discharged, you can call the unit and asked to speak with the hospitalist on call if the hospitalist that took care of you is not available. Once you are discharged, your primary care physician will handle any further medical issues. Please note that NO REFILLS for any discharge medications will be authorized once you are discharged, as it is imperative that you return to your primary care physician (or establish a relationship with a primary care physician if you do not have one) for your aftercare needs so that they can reassess your need for medications and monitor your lab values.

## 2015-09-22 LAB — HEMOGLOBIN A1C
Hgb A1c MFr Bld: 6.5 % — ABNORMAL HIGH (ref 4.8–5.6)
Mean Plasma Glucose: 140 mg/dL

## 2015-09-23 ENCOUNTER — Inpatient Hospital Stay (HOSPITAL_COMMUNITY)
Admission: EM | Admit: 2015-09-23 | Discharge: 2015-09-25 | DRG: 391 | Disposition: A | Discharge status: Home / Self Care | Payer: Self-pay | Attending: Internal Medicine | Admitting: Internal Medicine

## 2015-09-23 ENCOUNTER — Emergency Department (HOSPITAL_COMMUNITY): Payer: Self-pay

## 2015-09-23 ENCOUNTER — Encounter (HOSPITAL_COMMUNITY): Payer: Self-pay | Admitting: Emergency Medicine

## 2015-09-23 ENCOUNTER — Encounter (HOSPITAL_COMMUNITY)
Admission: EM | Disposition: A | Discharge status: Home / Self Care | Payer: Self-pay | Source: Home / Self Care | Attending: Internal Medicine

## 2015-09-23 DIAGNOSIS — J189 Pneumonia, unspecified organism: Secondary | ICD-10-CM | POA: Diagnosis present

## 2015-09-23 DIAGNOSIS — J440 Chronic obstructive pulmonary disease with acute lower respiratory infection: Secondary | ICD-10-CM | POA: Diagnosis present

## 2015-09-23 DIAGNOSIS — Z7984 Long term (current) use of oral hypoglycemic drugs: Secondary | ICD-10-CM

## 2015-09-23 DIAGNOSIS — K2970 Gastritis, unspecified, without bleeding: Secondary | ICD-10-CM | POA: Diagnosis present

## 2015-09-23 DIAGNOSIS — N5082 Scrotal pain: Secondary | ICD-10-CM | POA: Diagnosis present

## 2015-09-23 DIAGNOSIS — R112 Nausea with vomiting, unspecified: Secondary | ICD-10-CM

## 2015-09-23 DIAGNOSIS — R933 Abnormal findings on diagnostic imaging of other parts of digestive tract: Secondary | ICD-10-CM

## 2015-09-23 DIAGNOSIS — Z7951 Long term (current) use of inhaled steroids: Secondary | ICD-10-CM

## 2015-09-23 DIAGNOSIS — Z800 Family history of malignant neoplasm of digestive organs: Secondary | ICD-10-CM

## 2015-09-23 DIAGNOSIS — F17210 Nicotine dependence, cigarettes, uncomplicated: Secondary | ICD-10-CM | POA: Diagnosis present

## 2015-09-23 DIAGNOSIS — R109 Unspecified abdominal pain: Secondary | ICD-10-CM | POA: Insufficient documentation

## 2015-09-23 DIAGNOSIS — K4091 Unilateral inguinal hernia, without obstruction or gangrene, recurrent: Secondary | ICD-10-CM | POA: Diagnosis present

## 2015-09-23 DIAGNOSIS — B9681 Helicobacter pylori [H. pylori] as the cause of diseases classified elsewhere: Secondary | ICD-10-CM | POA: Diagnosis present

## 2015-09-23 DIAGNOSIS — Z794 Long term (current) use of insulin: Secondary | ICD-10-CM

## 2015-09-23 DIAGNOSIS — Z7952 Long term (current) use of systemic steroids: Secondary | ICD-10-CM

## 2015-09-23 DIAGNOSIS — K4090 Unilateral inguinal hernia, without obstruction or gangrene, not specified as recurrent: Secondary | ICD-10-CM | POA: Diagnosis present

## 2015-09-23 DIAGNOSIS — B3781 Candidal esophagitis: Secondary | ICD-10-CM | POA: Diagnosis present

## 2015-09-23 DIAGNOSIS — J449 Chronic obstructive pulmonary disease, unspecified: Secondary | ICD-10-CM | POA: Diagnosis present

## 2015-09-23 DIAGNOSIS — E876 Hypokalemia: Secondary | ICD-10-CM | POA: Diagnosis present

## 2015-09-23 DIAGNOSIS — E119 Type 2 diabetes mellitus without complications: Secondary | ICD-10-CM | POA: Diagnosis present

## 2015-09-23 DIAGNOSIS — K319 Disease of stomach and duodenum, unspecified: Secondary | ICD-10-CM | POA: Diagnosis present

## 2015-09-23 DIAGNOSIS — K2960 Other gastritis without bleeding: Principal | ICD-10-CM | POA: Diagnosis present

## 2015-09-23 DIAGNOSIS — R10817 Generalized abdominal tenderness: Secondary | ICD-10-CM

## 2015-09-23 DIAGNOSIS — R1084 Generalized abdominal pain: Secondary | ICD-10-CM

## 2015-09-23 DIAGNOSIS — Z79899 Other long term (current) drug therapy: Secondary | ICD-10-CM

## 2015-09-23 HISTORY — DX: Fatty (change of) liver, not elsewhere classified: K76.0

## 2015-09-23 HISTORY — DX: Type 2 diabetes mellitus without complications: E11.9

## 2015-09-23 HISTORY — PX: ESOPHAGOGASTRODUODENOSCOPY: SHX5428

## 2015-09-23 HISTORY — DX: Thrombocytopenia, unspecified: D69.6

## 2015-09-23 LAB — URINALYSIS, ROUTINE W REFLEX MICROSCOPIC
Bilirubin Urine: NEGATIVE
Glucose, UA: NEGATIVE mg/dL
KETONES UR: NEGATIVE mg/dL
LEUKOCYTES UA: NEGATIVE
NITRITE: NEGATIVE
PH: 7 (ref 5.0–8.0)
PROTEIN: 100 mg/dL — AB
Specific Gravity, Urine: 1.019 (ref 1.005–1.030)

## 2015-09-23 LAB — COMPREHENSIVE METABOLIC PANEL
ALBUMIN: 3.3 g/dL — AB (ref 3.5–5.0)
ALT: 48 U/L (ref 17–63)
ANION GAP: 10 (ref 5–15)
AST: 34 U/L (ref 15–41)
Alkaline Phosphatase: 52 U/L (ref 38–126)
BILIRUBIN TOTAL: 1.4 mg/dL — AB (ref 0.3–1.2)
BUN: 16 mg/dL (ref 6–20)
CO2: 26 mmol/L (ref 22–32)
Calcium: 8.7 mg/dL — ABNORMAL LOW (ref 8.9–10.3)
Chloride: 97 mmol/L — ABNORMAL LOW (ref 101–111)
Creatinine, Ser: 0.7 mg/dL (ref 0.61–1.24)
GFR calc non Af Amer: >60 mL/min (ref >60)
GLUCOSE: 129 mg/dL — AB (ref 65–99)
POTASSIUM: 3.4 mmol/L — AB (ref 3.5–5.1)
SODIUM: 133 mmol/L — AB (ref 135–145)
TOTAL PROTEIN: 6.6 g/dL (ref 6.5–8.1)

## 2015-09-23 LAB — CBC WITH DIFFERENTIAL/PLATELET
BASOS ABS: 0 10*3/uL (ref 0.0–0.1)
Basophils Relative: 0 %
EOS ABS: 0 10*3/uL (ref 0.0–0.7)
Eosinophils Relative: 0 %
HEMATOCRIT: 44.8 % (ref 39.0–52.0)
Hemoglobin: 15.6 g/dL (ref 13.0–17.0)
LYMPHS ABS: 1.3 10*3/uL (ref 0.7–4.0)
Lymphocytes Relative: 14 %
MCH: 32 pg (ref 26.0–34.0)
MCHC: 34.8 g/dL (ref 30.0–36.0)
MCV: 92 fL (ref 78.0–100.0)
Monocytes Absolute: 1.3 10*3/uL — ABNORMAL HIGH (ref 0.1–1.0)
Monocytes Relative: 14 %
NEUTROS ABS: 6.9 10*3/uL (ref 1.7–7.7)
Neutrophils Relative %: 72 %
Platelets: 233 10*3/uL (ref 150–400)
RBC: 4.87 MIL/uL (ref 4.22–5.81)
RDW: 12.3 % (ref 11.5–15.5)
WBC: 9.5 10*3/uL (ref 4.0–10.5)

## 2015-09-23 LAB — URINE MICROSCOPIC-ADD ON

## 2015-09-23 LAB — LIPASE, BLOOD: Lipase: 26 U/L (ref 11–51)

## 2015-09-23 SURGERY — EGD (ESOPHAGOGASTRODUODENOSCOPY)
Anesthesia: Moderate Sedation

## 2015-09-23 MED ORDER — PANTOPRAZOLE SODIUM 40 MG IV SOLR: 40.0000 mg | INTRAVENOUS | Status: DC

## 2015-09-23 MED ORDER — HYDROMORPHONE HCL 1 MG/ML IJ SOLN
1.0000 mg | INTRAMUSCULAR | Status: DC | PRN
  Administered 2015-09-23 – 2015-09-24 (×3): 1 mg via INTRAVENOUS
  Filled 2015-09-23 (×3): qty 1

## 2015-09-23 MED ORDER — ACETAMINOPHEN 325 MG PO TABS: 650.0000 mg | ORAL_TABLET | Freq: Four times a day (QID) | ORAL | Status: DC | PRN

## 2015-09-23 MED ORDER — FENTANYL CITRATE (PF) 100 MCG/2ML IJ SOLN
INTRAMUSCULAR | Status: AC
  Filled 2015-09-23: qty 2

## 2015-09-23 MED ORDER — PANTOPRAZOLE SODIUM 40 MG IV SOLR
40.0000 mg | Freq: Two times a day (BID) | INTRAVENOUS | Status: DC
  Administered 2015-09-23 – 2015-09-25 (×4): 40 mg via INTRAVENOUS
  Filled 2015-09-23 (×5): qty 40

## 2015-09-23 MED ORDER — ONDANSETRON HCL 4 MG PO TABS: 4.0000 mg | ORAL_TABLET | Freq: Four times a day (QID) | ORAL | Status: DC | PRN

## 2015-09-23 MED ORDER — MIDAZOLAM HCL 5 MG/ML IJ SOLN
INTRAMUSCULAR | Status: AC
  Filled 2015-09-23: qty 2

## 2015-09-23 MED ORDER — ONDANSETRON HCL 4 MG/2ML IJ SOLN
4.0000 mg | Freq: Once | INTRAMUSCULAR | Status: AC | PRN
  Administered 2015-09-23: 4 mg via INTRAVENOUS
  Filled 2015-09-23: qty 2

## 2015-09-23 MED ORDER — ENOXAPARIN SODIUM 40 MG/0.4ML ~~LOC~~ SOLN
40.0000 mg | SUBCUTANEOUS | Status: DC
  Administered 2015-09-24: 40 mg via SUBCUTANEOUS
  Filled 2015-09-23 (×3): qty 0.4

## 2015-09-23 MED ORDER — GI COCKTAIL ~~LOC~~
30.0000 mL | Freq: Once | ORAL | Status: AC
  Administered 2015-09-23: 30 mL via ORAL
  Filled 2015-09-23: qty 30

## 2015-09-23 MED ORDER — SODIUM CHLORIDE 0.9 % IV SOLN: INTRAVENOUS | Status: DC

## 2015-09-23 MED ORDER — POTASSIUM CHLORIDE IN NACL 40-0.9 MEQ/L-% IV SOLN
INTRAVENOUS | Status: DC
  Administered 2015-09-23 – 2015-09-24 (×2): 100 mL/h via INTRAVENOUS
  Filled 2015-09-23 (×5): qty 1000

## 2015-09-23 MED ORDER — ONDANSETRON HCL 4 MG/2ML IJ SOLN
4.0000 mg | Freq: Once | INTRAMUSCULAR | Status: AC
  Administered 2015-09-23: 4 mg via INTRAVENOUS
  Filled 2015-09-23: qty 2

## 2015-09-23 MED ORDER — SODIUM CHLORIDE 0.9 % IV BOLUS (SEPSIS)
1000.0000 mL | Freq: Once | INTRAVENOUS | Status: AC
  Administered 2015-09-23: 1000 mL via INTRAVENOUS

## 2015-09-23 MED ORDER — FLUCONAZOLE IN SODIUM CHLORIDE 100-0.9 MG/50ML-% IV SOLN
100.0000 mg | INTRAVENOUS | Status: DC
  Administered 2015-09-23 – 2015-09-24 (×2): 100 mg via INTRAVENOUS
  Filled 2015-09-23 (×3): qty 50

## 2015-09-23 MED ORDER — ALBUTEROL SULFATE (2.5 MG/3ML) 0.083% IN NEBU: 5.0000 mg | INHALATION_SOLUTION | Freq: Four times a day (QID) | RESPIRATORY_TRACT | Status: DC | PRN

## 2015-09-23 MED ORDER — OXYCODONE HCL 5 MG PO TABS
5.0000 mg | ORAL_TABLET | ORAL | Status: DC | PRN
  Administered 2015-09-23 – 2015-09-24 (×3): 5 mg via ORAL
  Filled 2015-09-23 (×3): qty 1

## 2015-09-23 MED ORDER — IOHEXOL 350 MG/ML SOLN
100.0000 mL | Freq: Once | INTRAVENOUS | Status: AC | PRN
  Administered 2015-09-23: 100 mL via INTRAVENOUS

## 2015-09-23 MED ORDER — LEVOFLOXACIN IN D5W 750 MG/150ML IV SOLN
750.0000 mg | INTRAVENOUS | Status: DC
  Administered 2015-09-23 – 2015-09-24 (×2): 750 mg via INTRAVENOUS
  Filled 2015-09-23 (×3): qty 150

## 2015-09-23 MED ORDER — BUTAMBEN-TETRACAINE-BENZOCAINE 2-2-14 % EX AERO
INHALATION_SPRAY | CUTANEOUS | Status: DC | PRN
  Administered 2015-09-23: 2 via TOPICAL

## 2015-09-23 MED ORDER — HYDROMORPHONE HCL 1 MG/ML IJ SOLN
1.0000 mg | INTRAMUSCULAR | Status: AC
  Administered 2015-09-23 (×2): 1 mg via INTRAVENOUS
  Filled 2015-09-23 (×2): qty 1

## 2015-09-23 MED ORDER — PANTOPRAZOLE SODIUM 40 MG IV SOLR
40.0000 mg | Freq: Two times a day (BID) | INTRAVENOUS | Status: DC
  Administered 2015-09-23: 40 mg via INTRAVENOUS
  Filled 2015-09-23 (×2): qty 40

## 2015-09-23 MED ORDER — HYDROMORPHONE HCL 1 MG/ML IJ SOLN
1.0000 mg | Freq: Once | INTRAMUSCULAR | Status: AC
  Administered 2015-09-23: 1 mg via INTRAVENOUS
  Filled 2015-09-23: qty 1

## 2015-09-23 MED ORDER — ACETAMINOPHEN 650 MG RE SUPP: 650.0000 mg | Freq: Four times a day (QID) | RECTAL | Status: DC | PRN

## 2015-09-23 MED ORDER — DIPHENHYDRAMINE HCL 50 MG/ML IJ SOLN
INTRAMUSCULAR | Status: DC | PRN
  Administered 2015-09-23 (×2): 25 mg via INTRAVENOUS

## 2015-09-23 MED ORDER — SODIUM CHLORIDE 0.9 % IV SOLN
80.0000 mg | Freq: Once | INTRAVENOUS | Status: AC
  Administered 2015-09-23: 80 mg via INTRAVENOUS
  Filled 2015-09-23: qty 80

## 2015-09-23 MED ORDER — ONDANSETRON HCL 4 MG/2ML IJ SOLN
4.0000 mg | Freq: Four times a day (QID) | INTRAMUSCULAR | Status: DC | PRN
  Filled 2015-09-23: qty 2

## 2015-09-23 MED ORDER — DIPHENHYDRAMINE HCL 50 MG/ML IJ SOLN
INTRAMUSCULAR | Status: AC
  Filled 2015-09-23: qty 1

## 2015-09-23 MED ORDER — MIDAZOLAM HCL 10 MG/2ML IJ SOLN
INTRAMUSCULAR | Status: DC | PRN
  Administered 2015-09-23: 2 mg via INTRAVENOUS

## 2015-09-23 MED ORDER — FENTANYL CITRATE (PF) 100 MCG/2ML IJ SOLN
INTRAMUSCULAR | Status: DC | PRN
  Administered 2015-09-23: 25 ug via INTRAVENOUS

## 2015-09-23 MED ORDER — SODIUM CHLORIDE 0.9 % IV SOLN
Freq: Once | INTRAVENOUS | Status: AC
  Administered 2015-09-23: 10:00:00 via INTRAVENOUS

## 2015-09-23 NOTE — ED Provider Notes (Addendum)
CSN: [id number]     Arrival date & time 09/23/15  0359 History   First MD Initiated Contact with Patient 09/23/15 [phone]     Chief Complaint  Patient presents with  . Abdominal Pain     (Consider location/radiation/quality/duration/timing/severity/associated sxs/prior Treatment) HPI Comments: Pt comes in with severe abd pain. Hx of COPD with recent admission for CAP. Pt also has DM. Pt reports waking up with severe abd pain starting 1 am. Pain is sharp and severe. No hx of similar pain. Pain radiates from the suprapubic region to the epigastrium. At onset it was just in the suprapubic region. Pain also moves to scrotum. No radiation to the back or back pain. Pt denies uti like symptoms, hx of renal stones or abd surgeries.    ROS 10 Systems reviewed and are negative for acute change except as noted in the HPI.     The history is provided by the patient.    Past Medical History  Diagnosis Date  . COPD (chronic obstructive pulmonary disease) ([place])   . Emphysema/COPD ([place])   . Emphysema lung [facility])    Past Surgical History  Procedure Laterality Date  . Foot surgery     Family History  Problem Relation Age of Onset  . Emphysema Mother   . Emphysema Father   . Diabetes Mother    Social History  Substance Use Topics  . Smoking status: Current Every Day Smoker -- 1.00 packs/day    Types: Cigarettes  . Smokeless tobacco: Never Used  . Alcohol Use: 3.6 oz/week    6 Cans of beer per week    Review of Systems    Allergies  Review of patient's allergies indicates no known allergies.  Home Medications   Prior to Admission medications   Medication Sig Start Date End Date Taking? Authorizing Provider  albuterol (PROVENTIL) (2.5 MG/3ML) 0.083% nebulizer solution Take 6 mLs (5 mg total) by nebulization every 6 (six) hours as needed for wheezing or shortness of breath. 03/03/15  Yes Etta Quill, NP  levofloxacin (LEVAQUIN) 750 MG tablet Take 1 tablet (750 mg total) by mouth  daily. Please take for 3 days then stop 09/21/15  Yes Albertine Patricia, MD  metFORMIN (GLUCOPHAGE) 500 MG tablet Take 1 tablet (500 mg total) by mouth 2 (two) times daily with a meal. 09/21/15  Yes Albertine Patricia, MD  predniSONE (DELTASONE) 10 MG tablet Please take 30 mg for 1 day, then 20 mg for 1 day, then 10 mg for 1 day then stop. 09/21/15  Yes Dawood S Elgergawy, MD   BP 168/91 mmHg  Pulse 64  Temp(Src) 97.5 F (36.4 C) (Oral)  Resp 21  Ht 5\' 9"  (1.753 m)  Wt 144 lb (65.318 kg)  BMI 21.26 kg/m2  SpO2 94% Physical Exam  Constitutional: He is oriented to person, place, and time. He appears well-developed.  HENT:  Head: Normocephalic and atraumatic.  Eyes: Conjunctivae and EOM are normal. Pupils are equal, round, and reactive to light.  Neck: Normal range of motion. Neck supple.  Cardiovascular: Normal rate, regular rhythm, normal heart sounds and intact distal pulses.   Pulmonary/Chest: Effort normal and breath sounds normal. No respiratory distress. He has no wheezes.  Abdominal: Soft. Bowel sounds are normal. He exhibits no distension. There is tenderness. There is rebound and guarding.  Generalized tenderness. No flank tenderness. Inguinal hernia - reducible  Genitourinary:  Pt has tender testicles, L worse than R. Pt's testicles are free moving. Pt has no rash.  Cremesteric reflex is faint, but present.  Neurological: He is alert and oriented to person, place, and time.  Skin: Skin is warm.  Nursing note and vitals reviewed.   ED Course  Procedures (including critical care time) Labs Review Labs Reviewed  CBC WITH DIFFERENTIAL/PLATELET - Abnormal; Notable for the following:    Monocytes Absolute 1.3 (*)    All other components within normal limits  COMPREHENSIVE METABOLIC PANEL - Abnormal; Notable for the following:    Sodium 133 (*)    Potassium 3.4 (*)    Chloride 97 (*)    Glucose, Bld 129 (*)    Calcium 8.7 (*)    Albumin 3.3 (*)    Total Bilirubin 1.4 (*)     All other components within normal limits  URINALYSIS, ROUTINE W REFLEX MICROSCOPIC (NOT AT [facility]) - Abnormal; Notable for the following:    Hgb urine dipstick TRACE (*)    Protein, ur 100 (*)    All other components within normal limits  URINE MICROSCOPIC-ADD ON - Abnormal; Notable for the following:    Squamous Epithelial / LPF 0-5 (*)    Bacteria, UA RARE (*)    All other components within normal limits  LIPASE, BLOOD    Imaging Review Dg Chest 2 View  09/23/2015  CLINICAL DATA:  60 year old male with abdominal pain and left-sided chest pain EXAM: CHEST  2 VIEW COMPARISON:  Radiograph dated 09/19/2015 a FINDINGS: Two views of the chest demonstrate emphysematous changes of the lungs. There is no focal consolidation, pleural effusion, or pneumothorax. The cardiac silhouette is within normal limits. There is mild diffuse increased interstitial prominence which may be related to underlying COPD changes or represent mild interstitial edema. No acute osseous pathology identified. Left rotator cuff surgical pinning noted. IMPRESSION: Emphysema.  No focal consolidation or pneumothorax. Electronically Signed   By: Anner Crete M.D.   On: 09/23/2015 05:06   Ct Angio Abd/pel W/ And/or W/o  09/23/2015  CLINICAL DATA:  60 year old male with a history of abdominal pain for several hours. EXAM: CT ABDOMEN AND PELVIS WITH CONTRAST TECHNIQUE: Multidetector CT imaging of the abdomen and pelvis was performed using the standard protocol following bolus administration of intravenous contrast. Re-formatted images in coronal and sagittal plane performed on a separate workstation with both arterial phase and delayed phase. CONTRAST:  153mL OMNIPAQUE IOHEXOL 350 MG/ML SOLN COMPARISON:  04/13/2012 FINDINGS: Lower chest: Unremarkable appearance of the soft tissues of the chest wall. Heart size within normal limits.  No pericardial fluid/thickening. No lower mediastinal adenopathy. Unremarkable appearance of the  distal esophagus. No hiatal hernia. Extensive changes of paraseptal and centrilobular emphysema. Motion artifact somewhat limits evaluation. Scarring/atelectasis of the lung bases. Nodular opacity at the right posterior base. Abdomen/pelvis: Unremarkable appearance of liver. Unremarkable appearance of spleen. Unremarkable gallbladder. Unremarkable appearance of pancreas. Unremarkable bilateral adrenal glands. No free fluid or free air within the mesenteric. Unremarkable appearance of the bilateral kidneys. Unremarkable course of the ureters. Small anterior right diverticulum of the urinary bladder which is relatively decompressed. Compared to the prior CT there is interval development of circumferential and diffuse gastric wall thickening involving cardia, fundus, antral region. No significant inflammatory changes of the associated mesenteric. No evidence of perforation. No abnormally distended small bowel or colon. 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 Small fact containing left inguinal hernia. Musculoskeletal: Mild changes of degenerative disc disease.  No bony canal narrowing. Vascular: Minimal atherosclerotic changes of the visualized vasculature. No aneurysm. No dissection flap. No  periaortic fluid. Bilateral iliac and iliofemoral system patent with no occlusion or narrowing. 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 Celiac artery contributes to splenic artery, hepatic artery, left gastric artery. Enlargement of the superior pancreaticoduodenal arteries, which demonstrate significant anastomosis/flow from the superior mesenteric artery. Superior mesenteric artery widely patent with no stenosis. Inferior mesenteric artery is patent. Renal arteries are patent. IMPRESSION: Circumferential and diffuse gastric wall thickening, new from the CT of 2013. This is most compatible with nonspecific gastritis,  however, infiltrative process such as lymphoma could also have this appearance. Recommend correlation with upper endoscopy. 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 Both the superior mesenteric artery and inferior mesenteric artery contribute to collateralized flow. Although this pattern can be seen with chronic mesenteric ischemia, this is not favored as the etiology of the gastric wall thickening, as the collateral vessels fill normally with no occlusion identified. Centrilobular and paraseptal emphysema. Nodular density at the base the right lung is favored to be inflammatory, however, a follow-up chest CT in 3-6 months is recommended. Left inguinal hernia. 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 These results were called by telephone at the time of interpretation on 09/23/2015 at 7:42 am to Dr. Varney Biles , who verbally acknowledged these results. Signed, Dulcy Fanny. Earleen Newport, DO [facility] Electronically Signed   By: Corrie Mckusick D.O.   On: 09/23/2015 07:42   I have personally reviewed and evaluated these images and lab results as part of my medical decision-making.   EKG Interpretation None      MDM   Final diagnoses:  Scrotal pain  Gastritis  Unilateral recurrent inguinal hernia without obstruction or gangrene    Pt comes in with sudden onset severe abd pain that started at 1 am. Pain was in the lower quadrants initially, and then moved up. Exam reveals normal vitals, normal vascular exam, but pt is severe distress and with pain out of proportion to the exam. He also has diffuse tenderness and also has scrotal pain. Pt has a hernia - but it was easily reducible.  DDX: Perforated viscus Mesenteric ischemia  (heavy smoker) Aortic dissection Torsion Epididymitis  CT-angio ordered.  8:07 AM CT angio is neg for acute process. Severe gastritis seen.  Clinically, i cant correlate the radiologic finding to the patient's presentation. [place] ordered - r/o epididymitis and torsion, although pretest probability is low.       Varney Biles, MD 09/23/15 [id number]  Varney Biles, MD 09/23/15 [id number]

## 2015-09-23 NOTE — Progress Notes (Signed)
1030 ED CM spoke with pt, daughter and wife about his upcoming appt at [facility] Friday 09/25/15 at 49 Daughter voiced appreciation and that she would take pt to appt to establish care Reviewed hospital f/u appt Daughter had attempt to get pt an appt but had been told none available until April Pt nodding his head at intervals to indicate understanding Dr Rama left pt room as Cm entered room

## 2015-09-23 NOTE — Consult Note (Signed)
Vascular Surgery Consultation  Reason for Consult: Acute onset abdominal pain at 1 AM with findings of occlusion of celiac axis  HPI: Duane Cuevas is a 60 y.o. male who presents for evaluation of acute abdominal pain at 1 AM. Patient states that he has been having intermittent abdominal discomfort and states he has lost about 30 pounds over the past several weeks. He smokes 1 pack of cigarettes per day and drinks up to 12 cans of beer per day on weekends that states he has not had any tobacco or alcohol in 2 weeks. He apparently awakened at 1 AM with cute abdominal pain and had vomiting and diarrhea. Patient was admitted and underwent upper endoscopy by Dr. Fuller Plan and this revealed some diffuse gastritis and Candida esophagitis. CT scan was performed which also noted some thickening of the stomach but no evidence of perforation, free air, or other severe abnormalities. 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 Patient has no history of atrial fibrillation or cardiac arrhythmias or myocardial infarction. He does have COPD and diabetes mellitus.   Past Medical History  Diagnosis Date  . Emphysema/COPD ([place]) 10/2014  . Diabetes mellitus ([place]) 10/2014  . Thrombocytopenia ([place]) 09/2015    platelets in 120s.    Past Surgical History  Procedure Laterality Date  . Foot surgery     Social History   Social History  . Marital Status: Married    Spouse Name: Ramonita Lab  . Number of Children: 6  . Years of Education: N/A   Occupational History  . Employed [employer]   Social History Main Topics  . Smoking status: Current Every Day Smoker -- 1.00 packs/day    Types: Cigarettes  . Smokeless tobacco: Never Used  . Alcohol Use: 3.6 oz/week    6 Cans of beer per week  . Drug Use: No  . Sexual Activity: No   Other Topics Concern  . None   Social History Narrative   Lives with wife.  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    Family History  Problem Relation Age of Onset  . Emphysema Mother   . Emphysema Father   . Diabetes Mother   . Cancer Sister     Stomach cancer   No Known Allergies Prior to Admission medications   Medication Sig Start Date End Date Taking? Authorizing Provider  albuterol (PROVENTIL) (2.5 MG/3ML) 0.083% nebulizer solution Take 6 mLs (5 mg total) by nebulization every 6 (six) hours as needed for wheezing or shortness of breath. 03/03/15  Yes Etta Quill, NP  levofloxacin (LEVAQUIN) 750 MG tablet Take 1 tablet (750 mg total) by mouth daily. Please take for 3 days then stop 09/21/15  Yes Albertine Patricia, MD  metFORMIN (GLUCOPHAGE) 500 MG tablet Take 1 tablet (500 mg total) by mouth 2 (two) times daily with a meal. 09/21/15  Yes Albertine Patricia, MD  predniSONE (DELTASONE) 10 MG tablet Please take 30 mg for 1 day, then 20 mg for 1 day, then 10 mg for 1 day then stop. 09/21/15  Yes Silver Huguenin Elgergawy, MD     Positive ROS: Denies claudication, does complain of heartburn. Also states he has COPD with occasional shortness of breath. Language barrier was present.  All other systems have been reviewed and were otherwise negative with the exception of those mentioned in the HPI and as above.  Physical Exam: Filed Vitals:   09/23/15 1600 09/23/15 1610  BP: 139/96 161/104  Pulse: 59 72  Temp:    Resp: 12 16    General: Alert, no acute distress HEENT: Normal for age Cardiovascular: Regular rate and rhythm. Carotid pulses 2+, no bruits audible Respiratory: Clear to auscultation. No cyanosis, no use of accessory musculature GI: No organomegaly, abdomen is Soft with mild discomfort but no rebound tenderness or diffuse abdominal pain. Skin: No lesions in the area of chief complaint Neurologic: Sensation intact distally Psychiatric: Patient is competent for consent with normal mood and affect Musculoskeletal: No obvious deformities Extremities: 3+ femoral popliteal dorsalis pedis pulses  palpable bilaterally.  Labs reviewed: White count 9000, creatinine 0.7, potassium 3.4,  Imaging reviewed: CT angiogram was reviewed by me. This reveals no evidence of significant atherosclerotic changes in the infrarenal aorta. Superior mesenteric artery and inferior mesenteric artery are widely patent with no evidence of atherosclerosis. Celiac axis appears to be occluded at the origin with a very smooth origin and the occlusion begins as a tapered lumen with excellent collateral flow into the splenic and hepatic arteries. No evidence of intraluminal filling defects.   Assessment/Plan:  Acute abdominal pain-etiology unknown Evidence of diffuse gastritis and Candida esophagitis in patient who reports history of occasional heavy alcohol use and smokes 1 pack cigarettes per day No evidence of atherosclerosis with widely patent SMA and IMA and good collateral flow to splenic and hepatic arteries from SMA  Doubt that his symptoms are related to his occluded celiac axis which likely is chronic. Could be due to compression by median arcuate ligament which caused slow chronic occlusion Has no history to suggest that he has had previous emboli or arrhythmias  Defer to general surgical evaluation regarding opinion as to whether patient has acute abdomen-but doubt vascular occlusion is the etiology   Tinnie Gens, MD 09/23/2015 5:42 PM

## 2015-09-23 NOTE — Interval H&P Note (Signed)
History and Physical Interval Note:  09/23/2015 2:41 PM  Duane Cuevas  has presented today for surgery, with the diagnosis of abdominal pain and stomach wall thickened on CT  The various methods of treatment have been discussed with the patient and family. After consideration of risks, benefits and other options for treatment, the patient has consented to  Procedure(s): ESOPHAGOGASTRODUODENOSCOPY (EGD) (N/A) as a surgical intervention .  The patient's history has been reviewed, patient examined, no change in status, stable for surgery.  I have reviewed the patient's chart and labs.  Questions were answered to the patient's satisfaction.     Pricilla Riffle. Fuller Plan

## 2015-09-23 NOTE — H&P (View-Only) (Signed)
[facility] Consult: 11:34 AM 09/23/2015     Referring Provider: Dr Rockne Menghini  Primary Care Physician:  Uses urgent care but referred to [facility] in 10/2014.  Primary Gastroenterologist:  Althia Forts.      Reason for Consultation:  Abdominal pain, N/V.  Abnormal stomach wall on CT.    HPI: Duane Cuevas is a 60 y.o. male.  He emigrated from [place] ~ 24 yrs ago.  Works as a Theme park manager.  Married.  His dtr is serving as Optometrist when needed but his English is fair.  Hx COPD, emphysema.  Stopped smoking as of recent admission. DM 2 diagnosed in 10/2014.  Thrombocytopenia in 120s during recent admission. S/p pinning of left ankle fracture in 1999.  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    Admission 3/18 - 09/21/15 with CAP.  Discharged on Levaquin, prednisone taper.  Metformin (which he had not been using) was restarted. Advised to stop using Naprosyn and Hydrocodone, though he had not recently been using either of these.   Has had 20 to 25# wt loss over several months: less po intake due to early satiety and some anorexia.  No abdominal pain.  No altered BM habits.  No reflux sxs.  Generally no abdominal pain, til today.  Admits to drinking a 40 oz beer daily, occasionally 80 oz per day.  No NSAIDs/ASA.   Presented to [facility] ED today c/o severe abdominal pain starting 0100 today.  Started in lower abdomen and radiated up both sides into epigastrium.  No radiation to back.  + non-bloody and non-CG emesis 4 or 5 times.    Hgb is 15.6, which is in his normal range. WBC os 9.5.  Platelets 233.  T bil is 1.4.  Albumin 3.3.  Otherwise normal LFTs.  Na 133, K 3.4  3/22 scrotal ultrasound with Doppler is normal.   09/23/15 CT angio of abd/pelvis: New (since 04/2012) gastric wall thickening, ?  Gastritis vs infiltrative process. 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  Pulmonary emphysema.  Left inguinal hernia.  Diverticulosis in colon.  Liver/biliary tract/GB/ pancreas normal.     Past Medical History  Diagnosis Date  . Emphysema/COPD ([place])   . Diabetes mellitus [facility])     Past Surgical History  Procedure Laterality Date  . Foot surgery      Prior to Admission medications   Medication Sig Start Date End Date Taking? Authorizing Provider  albuterol (PROVENTIL) (2.5 MG/3ML) 0.083% nebulizer solution Take 6 mLs (5 mg total) by nebulization every 6 (six) hours as needed for wheezing or shortness of breath. 03/03/15  Yes Etta Quill, NP  levofloxacin (LEVAQUIN) 750 MG tablet Take 1 tablet (750 mg total) by mouth daily. Please take for 3 days then stop 09/21/15  Yes Albertine Patricia, MD  metFORMIN (GLUCOPHAGE) 500 MG tablet Take 1 tablet (500 mg total) by mouth 2 (two) times daily with a meal. 09/21/15  Yes Albertine Patricia, MD  predniSONE (DELTASONE) 10 MG tablet Please take 30  mg for 1 day, then 20 mg for 1 day, then 10 mg for 1 day then stop. 09/21/15  Yes Albertine Patricia, MD    Scheduled Meds:  Infusions:  PRN Meds:    Allergies as of 09/23/2015  . (No Known Allergies)    Family History  Problem Relation Age of Onset  . Emphysema Mother   . Emphysema Father   . Diabetes Mother   . Cancer Sister     Stomach cancer    Social History   Social History  . Marital Status: Married    Spouse Name: Ramonita Lab  . Number of Children: 6  . Years of Education: N/A   Occupational History  . Employed [employer]   Social History Main Topics  . Smoking status: Current Every Day Smoker -- 1.00 packs/day    Types: Cigarettes  . Smokeless tobacco: Never Used  . Alcohol Use: 3.6 oz/week    6 Cans of beer per week  . Drug Use: No  . Sexual Activity: No   Other Topics Concern  . Not on file   Social  History Narrative   Lives with wife.  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    REVIEW OF SYSTEMS: Constitutional:  Per HPI.  No profound weakness or fatigue.  Takes it steady and slow due to his COPD.  Still able to work as a Theme park manager.  ENT:  No nose bleeds Pulm:  Occasional scant blood in sputum.  CV:  No palpitations, no LE edema.  GU:  No hematuria, no frequency GI:  Per HPI.  No dysphagia.  Heme:  No unusual bleeding or excessive bruising   Transfusions:  None ever.  Neuro:  No headaches, no peripheral tingling or numbness Derm:  No itching, no rash or sores.  Endocrine:  No sweats or chills.  No polyuria or dysuria Immunization:  Flu and pnvx on 09/21/15.  Travel:  None beyond local counties in last few months.    PHYSICAL EXAM: Vital signs in last 24 hours: Filed Vitals:   09/23/15 1001 09/23/15 1121  BP: 136/104 133/89  Pulse: 70 64  Temp: 97.5 F (36.4 C)   Resp: 15 13   Wt Readings from Last 3 Encounters:  09/23/15 65.318 kg (144 lb)  09/20/15 67.042 kg (147 lb 12.8 oz)  04/11/15 74.844 kg (165 lb)    General: pleasant, comfortable.  Looks old for age and like he has worked hard all his life.  Head:  No swelling or asymmetry.  No signs of trauma  Eyes:  No icterus, no conj pallor.  EOMUI Ears:  Not HOPH  Nose:  No congestion or discharge Mouth:  Clear, moist.  Full dentures.  No lesions Neck:  No mass, no JVD, no TMG Lungs:  Diminished but clear bil.  No cough or SOB Heart: RRR.  No MRG.  S1/S2 audible Abdomen:  Soft, ND.  No mass or HSM.  Tender diffusely with voluntary guarding.  No rebound.  BS hypoactive. .   Rectal: deferred    Musc/Skeltl: scar at lateral left ankle.   Extremities:  No CCE  Neurologic:  Oriented x 3.  No tremor.  No limb weakness.  Fully alert and oriented.  Skin:  No sores or suspicious lesions.  No rash Tattoos: on upper left chest (done ~ 2002), deltoid area right arm (from 2005): both professionally done. Nodes:  No cervical  adeno   Psych:  Pleasant, relaxed.   Intake/Output from previous  day:   Intake/Output this shift: Total I/O In: -  Out: 625 [Urine:625]  LAB RESULTS:  Recent Labs  09/21/15 0353 09/23/15 0506  WBC 14.5* 9.5  HGB 14.4 15.6  HCT 42.2 44.8  PLT 127* 233   BMET Lab Results  Component Value Date   NA 133* 09/23/2015   NA 135 09/21/2015   NA 132* 09/20/2015   K 3.4* 09/23/2015   K 3.6 09/21/2015   K 3.9 09/20/2015   CL 97* 09/23/2015   CL 104 09/21/2015   CL 101 09/20/2015   CO2 26 09/23/2015   CO2 23 09/21/2015   CO2 22 09/20/2015   GLUCOSE 129* 09/23/2015   GLUCOSE 200* 09/21/2015   GLUCOSE 264* 09/20/2015   BUN 16 09/23/2015   BUN 15 09/21/2015   BUN 9 09/20/2015   CREATININE 0.70 09/23/2015   CREATININE 0.71 09/21/2015   CREATININE 0.75 09/20/2015   CALCIUM 8.7* 09/23/2015   CALCIUM 8.3* 09/21/2015   CALCIUM 8.4* 09/20/2015   LFT  Recent Labs  09/23/15 0506  PROT 6.6  ALBUMIN 3.3*  AST 34  ALT 48  ALKPHOS 52  BILITOT 1.4*   PT/INR Lab Results  Component Value Date   INR 0.9 01/04/2008   Lipase     Component Value Date/Time   LIPASE 26 09/23/2015 0506    Drugs of Abuse  No results found for: LABOPIA, COCAINSCRNUR, LABBENZ, AMPHETMU, THCU, LABBARB   RADIOLOGY STUDIES: Dg Chest 2 View  09/23/2015  CLINICAL DATA:  60 year old male with abdominal pain and left-sided chest pain EXAM: CHEST  2 VIEW COMPARISON:  Radiograph dated 09/19/2015 a FINDINGS: Two views of the chest demonstrate emphysematous changes of the lungs. There is no focal consolidation, pleural effusion, or pneumothorax. The cardiac silhouette is within normal limits. There is mild diffuse increased interstitial prominence which may be related to underlying COPD changes or represent mild interstitial edema. No acute osseous pathology identified. Left rotator cuff surgical pinning noted. IMPRESSION: Emphysema.  No focal consolidation or pneumothorax. Electronically Signed   By: Anner Crete M.D.   On: 09/23/2015 05:06   US Scrotum  09/23/2015  CLINICAL DATA:  60 year old with left-greater-than-right scrotal pain. EXAM: SCROTAL ULTRASOUND DOPPLER ULTRASOUND OF THE TESTICLES TECHNIQUE: Complete ultrasound examination of the testicles, epididymis, and other scrotal structures was performed. Color and spectral Doppler ultrasound were also utilized to evaluate blood flow to the testicles. FINDINGS: Right testicle Measurements: 4.9 x 2.5 x 3.2 cm. No mass or microlithiasis visualized. Normal blood flow with color Doppler. Left testicle Measurements: 3.2 x 2.1 x 3.5 cm. No mass or microlithiasis visualized. Normal blood flow with color Doppler. Right epididymis:  Normal in size and appearance. Left epididymis:  Normal in size and appearance. Hydrocele:  None visualized. Varicocele:  None visualized. Pulsed Doppler interrogation of both testes demonstrates normal low resistance arterial and venous waveforms bilaterally. IMPRESSION: Normal scrotal ultrasound. Both testes appear normal with normal blood flow. No evidence of testicular torsion. Electronically Signed   By: Richardean Sale M.D.   On: 09/23/2015 09:08   [place] Art/ven Flow Abd Pelv Doppler  09/23/2015  CLINICAL DATA:  60 year old with left-greater-than-right scrotal pain. EXAM: SCROTAL ULTRASOUND DOPPLER ULTRASOUND OF THE TESTICLES TECHNIQUE: Complete ultrasound examination of the testicles, epididymis, and other scrotal structures was performed. Color and spectral Doppler ultrasound were also utilized to evaluate blood flow to the testicles. FINDINGS: Right testicle Measurements: 4.9 x 2.5 x 3.2 cm. No mass or microlithiasis visualized. Normal blood flow with color Doppler.  Left testicle Measurements: 3.2 x 2.1 x 3.5 cm. No mass or microlithiasis visualized. Normal blood flow with color Doppler. Right epididymis:  Normal in size and appearance. Left epididymis:  Normal in size and appearance. Hydrocele:  None visualized. Varicocele:   None visualized. Pulsed Doppler interrogation of both testes demonstrates normal low resistance arterial and venous waveforms bilaterally. IMPRESSION: Normal scrotal ultrasound. Both testes appear normal with normal blood flow. No evidence of testicular torsion. Electronically Signed   By: Richardean Sale M.D.   On: 09/23/2015 09:08   Ct Angio Abd/pel W/ And/or W/o  09/23/2015  CLINICAL DATA:  60 year old male with a history of abdominal pain for several hours. EXAM: CT ABDOMEN AND PELVIS WITH CONTRAST TECHNIQUE: Multidetector CT imaging of the abdomen and pelvis was performed using the standard protocol following bolus administration of intravenous contrast. Re-formatted images in coronal and sagittal plane performed on a separate workstation with both arterial phase and delayed phase. CONTRAST:  125mL OMNIPAQUE IOHEXOL 350 MG/ML SOLN COMPARISON:  04/13/2012 FINDINGS: Lower chest: Unremarkable appearance of the soft tissues of the chest wall. Heart size within normal limits.  No pericardial fluid/thickening. No lower mediastinal adenopathy. Unremarkable appearance of the distal esophagus. No hiatal hernia. Extensive changes of paraseptal and centrilobular emphysema. Motion artifact somewhat limits evaluation. Scarring/atelectasis of the lung bases. Nodular opacity at the right posterior base. Abdomen/pelvis: Unremarkable appearance of liver. Unremarkable appearance of spleen. Unremarkable gallbladder. Unremarkable appearance of pancreas. Unremarkable bilateral adrenal glands. No free fluid or free air within the mesenteric. Unremarkable appearance of the bilateral kidneys. Unremarkable course of the ureters. Small anterior right diverticulum of the urinary bladder which is relatively decompressed. Compared to the prior CT there is interval development of circumferential and diffuse gastric wall thickening involving cardia, fundus, antral region. No significant inflammatory changes of the associated mesenteric.  No evidence of perforation. No abnormally distended small bowel or colon. 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 Small fact containing left inguinal hernia. Musculoskeletal: Mild changes of degenerative disc disease.  No bony canal narrowing. Vascular: Minimal atherosclerotic changes of the visualized vasculature. No aneurysm. No dissection flap. No periaortic fluid. Bilateral iliac and iliofemoral system patent with no occlusion or narrowing. 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 Celiac artery contributes to splenic artery, hepatic artery, left gastric artery. Enlargement of the superior pancreaticoduodenal arteries, which demonstrate significant anastomosis/flow from the superior mesenteric artery. Superior mesenteric artery widely patent with no stenosis. Inferior mesenteric artery is patent. Renal arteries are patent. IMPRESSION: Circumferential and diffuse gastric wall thickening, new from the CT of 2013. This is most compatible with nonspecific gastritis, however, infiltrative process such as lymphoma could also have this appearance. Recommend correlation with upper endoscopy. 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 Both the superior mesenteric artery and inferior mesenteric artery contribute to collateralized flow. Although this pattern can be seen with chronic mesenteric ischemia, this is not favored as the etiology of the gastric wall thickening, as the collateral vessels fill normally with no occlusion identified. Centrilobular and paraseptal emphysema. Nodular density at the base the right lung is favored to be inflammatory, however, a follow-up chest CT in 3-6 months is recommended. Left inguinal hernia. 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 These results were called by telephone at the time of interpretation on 09/23/2015 at  7:42 am to Dr. Varney Biles , who verbally acknowledged these results. Signed, Dulcy Fanny. Earleen Newport, DO [facility] Electronically Signed   By: Corrie Mckusick D.O.   On: 09/23/2015 07:42    ENDOSCOPIC STUDIES: none  IMPRESSION:   *  Acute abdominal pain.   CT angio shows gastric wall thickening.  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  t bili minimally elevated, but liver/biliary tract/GB unremarkable on CT.    *  COPD/Emphysema.  Just discharged 2 days ago from CAP admission.   *  Type 2 DM.  Restarted on Metformin in last week.     PLAN:     *  EGD today.  Leave on IV PPI for now, once daily.  Asked RN to wait to give Lovenox until after EGD.     Azucena Freed  09/23/2015, 11:34 AM Pager: [phone]      Attending physician's note   I have taken a history, examined the patient and reviewed the chart. I agree with the Advanced Practitioner's note, impression and recommendations. Acute, sudden, generalized abdominal pain associated with nausea and vomiting. Abdomen is diffusely tender with voluntary guarding. CTA shows diffuse gastric wall thickening, left inguinal hernia and celiac artery occlusion. 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 Could be acute on chronic ischemia. EGD today to further evaluate. The risks (including bleeding, perforation, infection, missed lesions, medication reactions and possible hospitalization or surgery if complications occur), benefits, and alternatives to endoscopy with possible biopsy and possible dilation were discussed with the patient and they consent to proceed.     Lucio Edward, MD [facility] [phone] Mon-Fri 8a-5p [phone] after 5p, weekends, holidays

## 2015-09-23 NOTE — Consult Note (Signed)
[facility] Consult: 11:34 AM 09/23/2015     Referring Provider: Dr Rockne Menghini  Primary Care Physician:  Uses urgent care but referred to [facility] in 10/2014.  Primary Gastroenterologist:  Althia Forts.      Reason for Consultation:  Abdominal pain, N/V.  Abnormal stomach wall on CT.    HPI: Duane Cuevas is a 60 y.o. male.  He emigrated from [place] ~ 24 yrs ago.  Works as a Theme park manager.  Married.  His dtr is serving as Optometrist when needed but his English is fair.  Hx COPD, emphysema.  Stopped smoking as of recent admission. DM 2 diagnosed in 10/2014.  Thrombocytopenia in 120s during recent admission. S/p pinning of left ankle fracture in 1999.  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    Admission 3/18 - 09/21/15 with CAP.  Discharged on Levaquin, prednisone taper.  Metformin (which he had not been using) was restarted. Advised to stop using Naprosyn and Hydrocodone, though he had not recently been using either of these.   Has had 20 to 25# wt loss over several months: less po intake due to early satiety and some anorexia.  No abdominal pain.  No altered BM habits.  No reflux sxs.  Generally no abdominal pain, til today.  Admits to drinking a 40 oz beer daily, occasionally 80 oz per day.  No NSAIDs/ASA.   Presented to [facility] ED today c/o severe abdominal pain starting 0100 today.  Started in lower abdomen and radiated up both sides into epigastrium.  No radiation to back.  + non-bloody and non-CG emesis 4 or 5 times.    Hgb is 15.6, which is in his normal range. WBC os 9.5.  Platelets 233.  T bil is 1.4.  Albumin 3.3.  Otherwise normal LFTs.  Na 133, K 3.4  3/22 scrotal ultrasound with Doppler is normal.   09/23/15 CT angio of abd/pelvis: New (since 04/2012) gastric wall thickening, ?  Gastritis vs infiltrative process. 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  Pulmonary emphysema.  Left inguinal hernia.  Diverticulosis in colon.  Liver/biliary tract/GB/ pancreas normal.     Past Medical History  Diagnosis Date  . Emphysema/COPD ([place])   . Diabetes mellitus [facility])     Past Surgical History  Procedure Laterality Date  . Foot surgery      Prior to Admission medications   Medication Sig Start Date End Date Taking? Authorizing Provider  albuterol (PROVENTIL) (2.5 MG/3ML) 0.083% nebulizer solution Take 6 mLs (5 mg total) by nebulization every 6 (six) hours as needed for wheezing or shortness of breath. 03/03/15  Yes Etta Quill, NP  levofloxacin (LEVAQUIN) 750 MG tablet Take 1 tablet (750 mg total) by mouth daily. Please take for 3 days then stop 09/21/15  Yes Albertine Patricia, MD  metFORMIN (GLUCOPHAGE) 500 MG tablet Take 1 tablet (500 mg total) by mouth 2 (two) times daily with a meal. 09/21/15  Yes Albertine Patricia, MD  predniSONE (DELTASONE) 10 MG tablet Please take 30  mg for 1 day, then 20 mg for 1 day, then 10 mg for 1 day then stop. 09/21/15  Yes Albertine Patricia, MD    Scheduled Meds:  Infusions:  PRN Meds:    Allergies as of 09/23/2015  . (No Known Allergies)    Family History  Problem Relation Age of Onset  . Emphysema Mother   . Emphysema Father   . Diabetes Mother   . Cancer Sister     Stomach cancer    Social History   Social History  . Marital Status: Married    Spouse Name: Ramonita Lab  . Number of Children: 6  . Years of Education: N/A   Occupational History  . Employed [employer]   Social History Main Topics  . Smoking status: Current Every Day Smoker -- 1.00 packs/day    Types: Cigarettes  . Smokeless tobacco: Never Used  . Alcohol Use: 3.6 oz/week    6 Cans of beer per week  . Drug Use: No  . Sexual Activity: No   Other Topics Concern  . Not on file   Social  History Narrative   Lives with wife.  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    REVIEW OF SYSTEMS: Constitutional:  Per HPI.  No profound weakness or fatigue.  Takes it steady and slow due to his COPD.  Still able to work as a Theme park manager.  ENT:  No nose bleeds Pulm:  Occasional scant blood in sputum.  CV:  No palpitations, no LE edema.  GU:  No hematuria, no frequency GI:  Per HPI.  No dysphagia.  Heme:  No unusual bleeding or excessive bruising   Transfusions:  None ever.  Neuro:  No headaches, no peripheral tingling or numbness Derm:  No itching, no rash or sores.  Endocrine:  No sweats or chills.  No polyuria or dysuria Immunization:  Flu and pnvx on 09/21/15.  Travel:  None beyond local counties in last few months.    PHYSICAL EXAM: Vital signs in last 24 hours: Filed Vitals:   09/23/15 1001 09/23/15 1121  BP: 136/104 133/89  Pulse: 70 64  Temp: 97.5 F (36.4 C)   Resp: 15 13   Wt Readings from Last 3 Encounters:  09/23/15 65.318 kg (144 lb)  09/20/15 67.042 kg (147 lb 12.8 oz)  04/11/15 74.844 kg (165 lb)    General: pleasant, comfortable.  Looks old for age and like he has worked hard all his life.  Head:  No swelling or asymmetry.  No signs of trauma  Eyes:  No icterus, no conj pallor.  EOMUI Ears:  Not HOPH  Nose:  No congestion or discharge Mouth:  Clear, moist.  Full dentures.  No lesions Neck:  No mass, no JVD, no TMG Lungs:  Diminished but clear bil.  No cough or SOB Heart: RRR.  No MRG.  S1/S2 audible Abdomen:  Soft, ND.  No mass or HSM.  Tender diffusely with voluntary guarding.  No rebound.  BS hypoactive. .   Rectal: deferred    Musc/Skeltl: scar at lateral left ankle.   Extremities:  No CCE  Neurologic:  Oriented x 3.  No tremor.  No limb weakness.  Fully alert and oriented.  Skin:  No sores or suspicious lesions.  No rash Tattoos: on upper left chest (done ~ 2002), deltoid area right arm (from 2005): both professionally done. Nodes:  No cervical  adeno   Psych:  Pleasant, relaxed.   Intake/Output from previous  day:   Intake/Output this shift: Total I/O In: -  Out: 625 [Urine:625]  LAB RESULTS:  Recent Labs  09/21/15 0353 09/23/15 0506  WBC 14.5* 9.5  HGB 14.4 15.6  HCT 42.2 44.8  PLT 127* 233   BMET Lab Results  Component Value Date   NA 133* 09/23/2015   NA 135 09/21/2015   NA 132* 09/20/2015   K 3.4* 09/23/2015   K 3.6 09/21/2015   K 3.9 09/20/2015   CL 97* 09/23/2015   CL 104 09/21/2015   CL 101 09/20/2015   CO2 26 09/23/2015   CO2 23 09/21/2015   CO2 22 09/20/2015   GLUCOSE 129* 09/23/2015   GLUCOSE 200* 09/21/2015   GLUCOSE 264* 09/20/2015   BUN 16 09/23/2015   BUN 15 09/21/2015   BUN 9 09/20/2015   CREATININE 0.70 09/23/2015   CREATININE 0.71 09/21/2015   CREATININE 0.75 09/20/2015   CALCIUM 8.7* 09/23/2015   CALCIUM 8.3* 09/21/2015   CALCIUM 8.4* 09/20/2015   LFT  Recent Labs  09/23/15 0506  PROT 6.6  ALBUMIN 3.3*  AST 34  ALT 48  ALKPHOS 52  BILITOT 1.4*   PT/INR Lab Results  Component Value Date   INR 0.9 01/04/2008   Lipase     Component Value Date/Time   LIPASE 26 09/23/2015 0506    Drugs of Abuse  No results found for: LABOPIA, COCAINSCRNUR, LABBENZ, AMPHETMU, THCU, LABBARB   RADIOLOGY STUDIES: Dg Chest 2 View  09/23/2015  CLINICAL DATA:  60 year old male with abdominal pain and left-sided chest pain EXAM: CHEST  2 VIEW COMPARISON:  Radiograph dated 09/19/2015 a FINDINGS: Two views of the chest demonstrate emphysematous changes of the lungs. There is no focal consolidation, pleural effusion, or pneumothorax. The cardiac silhouette is within normal limits. There is mild diffuse increased interstitial prominence which may be related to underlying COPD changes or represent mild interstitial edema. No acute osseous pathology identified. Left rotator cuff surgical pinning noted. IMPRESSION: Emphysema.  No focal consolidation or pneumothorax. Electronically Signed   By: Anner Crete M.D.   On: 09/23/2015 05:06   US Scrotum  09/23/2015  CLINICAL DATA:  60 year old with left-greater-than-right scrotal pain. EXAM: SCROTAL ULTRASOUND DOPPLER ULTRASOUND OF THE TESTICLES TECHNIQUE: Complete ultrasound examination of the testicles, epididymis, and other scrotal structures was performed. Color and spectral Doppler ultrasound were also utilized to evaluate blood flow to the testicles. FINDINGS: Right testicle Measurements: 4.9 x 2.5 x 3.2 cm. No mass or microlithiasis visualized. Normal blood flow with color Doppler. Left testicle Measurements: 3.2 x 2.1 x 3.5 cm. No mass or microlithiasis visualized. Normal blood flow with color Doppler. Right epididymis:  Normal in size and appearance. Left epididymis:  Normal in size and appearance. Hydrocele:  None visualized. Varicocele:  None visualized. Pulsed Doppler interrogation of both testes demonstrates normal low resistance arterial and venous waveforms bilaterally. IMPRESSION: Normal scrotal ultrasound. Both testes appear normal with normal blood flow. No evidence of testicular torsion. Electronically Signed   By: Richardean Sale M.D.   On: 09/23/2015 09:08   [place] Art/ven Flow Abd Pelv Doppler  09/23/2015  CLINICAL DATA:  60 year old with left-greater-than-right scrotal pain. EXAM: SCROTAL ULTRASOUND DOPPLER ULTRASOUND OF THE TESTICLES TECHNIQUE: Complete ultrasound examination of the testicles, epididymis, and other scrotal structures was performed. Color and spectral Doppler ultrasound were also utilized to evaluate blood flow to the testicles. FINDINGS: Right testicle Measurements: 4.9 x 2.5 x 3.2 cm. No mass or microlithiasis visualized. Normal blood flow with color Doppler.  Left testicle Measurements: 3.2 x 2.1 x 3.5 cm. No mass or microlithiasis visualized. Normal blood flow with color Doppler. Right epididymis:  Normal in size and appearance. Left epididymis:  Normal in size and appearance. Hydrocele:  None visualized. Varicocele:   None visualized. Pulsed Doppler interrogation of both testes demonstrates normal low resistance arterial and venous waveforms bilaterally. IMPRESSION: Normal scrotal ultrasound. Both testes appear normal with normal blood flow. No evidence of testicular torsion. Electronically Signed   By: Richardean Sale M.D.   On: 09/23/2015 09:08   Ct Angio Abd/pel W/ And/or W/o  09/23/2015  CLINICAL DATA:  60 year old male with a history of abdominal pain for several hours. EXAM: CT ABDOMEN AND PELVIS WITH CONTRAST TECHNIQUE: Multidetector CT imaging of the abdomen and pelvis was performed using the standard protocol following bolus administration of intravenous contrast. Re-formatted images in coronal and sagittal plane performed on a separate workstation with both arterial phase and delayed phase. CONTRAST:  181mL OMNIPAQUE IOHEXOL 350 MG/ML SOLN COMPARISON:  04/13/2012 FINDINGS: Lower chest: Unremarkable appearance of the soft tissues of the chest wall. Heart size within normal limits.  No pericardial fluid/thickening. No lower mediastinal adenopathy. Unremarkable appearance of the distal esophagus. No hiatal hernia. Extensive changes of paraseptal and centrilobular emphysema. Motion artifact somewhat limits evaluation. Scarring/atelectasis of the lung bases. Nodular opacity at the right posterior base. Abdomen/pelvis: Unremarkable appearance of liver. Unremarkable appearance of spleen. Unremarkable gallbladder. Unremarkable appearance of pancreas. Unremarkable bilateral adrenal glands. No free fluid or free air within the mesenteric. Unremarkable appearance of the bilateral kidneys. Unremarkable course of the ureters. Small anterior right diverticulum of the urinary bladder which is relatively decompressed. Compared to the prior CT there is interval development of circumferential and diffuse gastric wall thickening involving cardia, fundus, antral region. No significant inflammatory changes of the associated mesenteric.  No evidence of perforation. No abnormally distended small bowel or colon. 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 Small fact containing left inguinal hernia. Musculoskeletal: Mild changes of degenerative disc disease.  No bony canal narrowing. Vascular: Minimal atherosclerotic changes of the visualized vasculature. No aneurysm. No dissection flap. No periaortic fluid. Bilateral iliac and iliofemoral system patent with no occlusion or narrowing. 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 Celiac artery contributes to splenic artery, hepatic artery, left gastric artery. Enlargement of the superior pancreaticoduodenal arteries, which demonstrate significant anastomosis/flow from the superior mesenteric artery. Superior mesenteric artery widely patent with no stenosis. Inferior mesenteric artery is patent. Renal arteries are patent. IMPRESSION: Circumferential and diffuse gastric wall thickening, new from the CT of 2013. This is most compatible with nonspecific gastritis, however, infiltrative process such as lymphoma could also have this appearance. Recommend correlation with upper endoscopy. 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 Both the superior mesenteric artery and inferior mesenteric artery contribute to collateralized flow. Although this pattern can be seen with chronic mesenteric ischemia, this is not favored as the etiology of the gastric wall thickening, as the collateral vessels fill normally with no occlusion identified. Centrilobular and paraseptal emphysema. Nodular density at the base the right lung is favored to be inflammatory, however, a follow-up chest CT in 3-6 months is recommended. Left inguinal hernia. 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 These results were called by telephone at the time of interpretation on 09/23/2015 at  7:42 am to Dr. Varney Biles , who verbally acknowledged these results. Signed, Dulcy Fanny. Earleen Newport, DO [facility] Electronically Signed   By: Corrie Mckusick D.O.   On: 09/23/2015 07:42    ENDOSCOPIC STUDIES: none  IMPRESSION:   *  Acute abdominal pain.   CT angio shows gastric wall thickening.  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  t bili minimally elevated, but liver/biliary tract/GB unremarkable on CT.    *  COPD/Emphysema.  Just discharged 2 days ago from CAP admission.   *  Type 2 DM.  Restarted on Metformin in last week.     PLAN:     *  EGD today.  Leave on IV PPI for now, once daily.  Asked RN to wait to give Lovenox until after EGD.     Azucena Freed  09/23/2015, 11:34 AM Pager: [phone]      Attending physician's note   I have taken a history, examined the patient and reviewed the chart. I agree with the Advanced Practitioner's note, impression and recommendations. Acute, sudden, generalized abdominal pain associated with nausea and vomiting. Abdomen is diffusely tender with voluntary guarding. CTA shows diffuse gastric wall thickening, left inguinal hernia and celiac artery occlusion. 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 Could be acute on chronic ischemia. EGD today to further evaluate. The risks (including bleeding, perforation, infection, missed lesions, medication reactions and possible hospitalization or surgery if complications occur), benefits, and alternatives to endoscopy with possible biopsy and possible dilation were discussed with the patient and they consent to proceed.     Lucio Edward, MD [facility] [phone] Mon-Fri 8a-5p [phone] after 5p, weekends, holidays

## 2015-09-23 NOTE — Progress Notes (Signed)
Consulted [facility] CM to see if there's a possible appointment for follow-up care at [facility] for this patient

## 2015-09-23 NOTE — ED Notes (Addendum)
Pt c/o pain to generalized abd and rib area x 4 hours. Pt released from this facility yesterday for PNA. Emesis x 4 PTA

## 2015-09-23 NOTE — Hospital Discharge Follow-Up (Signed)
This Case Manager received communication from Joellyn Quails, [facility] ED CM that patient needing ED follow-up appointment. Patient is uninsured and does not have a PCP. ED follow-up appointment scheduled for 09/25/15 at 1700 with Freeman Caldron, PA. AVS updated. Joellyn Quails, RN CM also updated.

## 2015-09-23 NOTE — ED Notes (Signed)
MD at bedside. 

## 2015-09-23 NOTE — ED Provider Notes (Signed)
Care transferred to me. U/s shows no torsion. Pain improved but still present. Per prior plan with Dr. Kathrynn Humble will admit for IV pain medicine and observation.    EKG Interpretation  Date/Time:  Wednesday September 23 2015 04:31:19 EDT Ventricular Rate:  72 PR Interval:  107 QRS Duration: 108 QT Interval:  412 QTC Calculation: 451 R Axis:   61 Text Interpretation:  Sinus rhythm Short PR interval Biatrial enlargement RSR' in V1 or V2, probably normal variant No significant change since last tracing Confirmed by Kathrynn Humble, MD, Thelma Comp [phone]) on 09/23/2015 9:07:52 AM        Results for orders placed or performed during the hospital encounter of 09/23/15  CBC with Differential  Result Value Ref Range   WBC 9.5 4.0 - 10.5 K/uL   RBC 4.87 4.22 - 5.81 MIL/uL   Hemoglobin 15.6 13.0 - 17.0 g/dL   HCT 44.8 39.0 - 52.0 %   MCV 92.0 78.0 - 100.0 fL   MCH 32.0 26.0 - 34.0 pg   MCHC 34.8 30.0 - 36.0 g/dL   RDW 12.3 11.5 - 15.5 %   Platelets 233 150 - 400 K/uL   Neutrophils Relative % 72 %   Lymphocytes Relative 14 %   Monocytes Relative 14 %   Eosinophils Relative 0 %   Basophils Relative 0 %   Neutro Abs 6.9 1.7 - 7.7 K/uL   Lymphs Abs 1.3 0.7 - 4.0 K/uL   Monocytes Absolute 1.3 (H) 0.1 - 1.0 K/uL   Eosinophils Absolute 0.0 0.0 - 0.7 K/uL   Basophils Absolute 0.0 0.0 - 0.1 K/uL   Smear Review MORPHOLOGY UNREMARKABLE   Comprehensive metabolic panel  Result Value Ref Range   Sodium 133 (L) 135 - 145 mmol/L   Potassium 3.4 (L) 3.5 - 5.1 mmol/L   Chloride 97 (L) 101 - 111 mmol/L   CO2 26 22 - 32 mmol/L   Glucose, Bld 129 (H) 65 - 99 mg/dL   BUN 16 6 - 20 mg/dL   Creatinine, Ser 0.70 0.61 - 1.24 mg/dL   Calcium 8.7 (L) 8.9 - 10.3 mg/dL   Total Protein 6.6 6.5 - 8.1 g/dL   Albumin 3.3 (L) 3.5 - 5.0 g/dL   AST 34 15 - 41 U/L   ALT 48 17 - 63 U/L   Alkaline Phosphatase 52 38 - 126 U/L   Total Bilirubin 1.4 (H) 0.3 - 1.2 mg/dL   GFR calc non Af Amer >60 >60 mL/min   GFR calc Af Amer >60  >60 mL/min   Anion gap 10 5 - 15  Lipase, blood  Result Value Ref Range   Lipase 26 11 - 51 U/L  Urinalysis, Routine w reflex microscopic (not at [facility])  Result Value Ref Range   Color, Urine YELLOW YELLOW   APPearance CLEAR CLEAR   Specific Gravity, Urine 1.019 1.005 - 1.030   pH 7.0 5.0 - 8.0   Glucose, UA NEGATIVE NEGATIVE mg/dL   Hgb urine dipstick TRACE (A) NEGATIVE   Bilirubin Urine NEGATIVE NEGATIVE   Ketones, ur NEGATIVE NEGATIVE mg/dL   Protein, ur 100 (A) NEGATIVE mg/dL   Nitrite NEGATIVE NEGATIVE   Leukocytes, UA NEGATIVE NEGATIVE  Urine microscopic-add on  Result Value Ref Range   Squamous Epithelial / LPF 0-5 (A) NONE SEEN   WBC, UA 0-5 0 - 5 WBC/hpf   RBC / HPF 0-5 0 - 5 RBC/hpf   Bacteria, UA RARE (A) NONE SEEN   Urine-Other MUCOUS PRESENT  Dg Chest 2 View  09/23/2015  CLINICAL DATA:  60 year old male with abdominal pain and left-sided chest pain EXAM: CHEST  2 VIEW COMPARISON:  Radiograph dated 09/19/2015 a FINDINGS: Two views of the chest demonstrate emphysematous changes of the lungs. There is no focal consolidation, pleural effusion, or pneumothorax. The cardiac silhouette is within normal limits. There is mild diffuse increased interstitial prominence which may be related to underlying COPD changes or represent mild interstitial edema. No acute osseous pathology identified. Left rotator cuff surgical pinning noted. IMPRESSION: Emphysema.  No focal consolidation or pneumothorax. Electronically Signed   By: Anner Crete M.D.   On: 09/23/2015 05:06   Dg Chest 2 View  09/19/2015  CLINICAL DATA:  Acute onset of cough, difficulty breathing, fever, chills and vomiting. Decreased O2 saturation. Initial encounter. EXAM: CHEST  2 VIEW COMPARISON:  Chest radiograph performed 03/03/2015 FINDINGS: The lungs are hyperexpanded, with flattening of the hemidiaphragms. Vascular congestion is noted, with mild peribronchial thickening. Mild right basilar opacity may reflect mild  pneumonia. There is no evidence of pleural effusion or pneumothorax. The heart is normal in size; the mediastinal contour is within normal limits. No acute osseous abnormalities are seen. IMPRESSION: Findings of COPD, with mild underlying vascular congestion. Mild right basilar opacity may reflect mild pneumonia. Electronically Signed   By: Garald Balding M.D.   On: 09/19/2015 21:58   Ct Angio Abd/pel W/ And/or W/o  09/23/2015  CLINICAL DATA:  60 year old male with a history of abdominal pain for several hours. EXAM: CT ABDOMEN AND PELVIS WITH CONTRAST TECHNIQUE: Multidetector CT imaging of the abdomen and pelvis was performed using the standard protocol following bolus administration of intravenous contrast. Re-formatted images in coronal and sagittal plane performed on a separate workstation with both arterial phase and delayed phase. CONTRAST:  130mL OMNIPAQUE IOHEXOL 350 MG/ML SOLN COMPARISON:  04/13/2012 FINDINGS: Lower chest: Unremarkable appearance of the soft tissues of the chest wall. Heart size within normal limits.  No pericardial fluid/thickening. No lower mediastinal adenopathy. Unremarkable appearance of the distal esophagus. No hiatal hernia. Extensive changes of paraseptal and centrilobular emphysema. Motion artifact somewhat limits evaluation. Scarring/atelectasis of the lung bases. Nodular opacity at the right posterior base. Abdomen/pelvis: Unremarkable appearance of liver. Unremarkable appearance of spleen. Unremarkable gallbladder. Unremarkable appearance of pancreas. Unremarkable bilateral adrenal glands. No free fluid or free air within the mesenteric. Unremarkable appearance of the bilateral kidneys. Unremarkable course of the ureters. Small anterior right diverticulum of the urinary bladder which is relatively decompressed. Compared to the prior CT there is interval development of circumferential and diffuse gastric wall thickening involving cardia, fundus, antral region. No significant  inflammatory changes of the associated mesenteric. No evidence of perforation. No abnormally distended small bowel or colon. 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 Small fact containing left inguinal hernia. Musculoskeletal: Mild changes of degenerative disc disease.  No bony canal narrowing. Vascular: Minimal atherosclerotic changes of the visualized vasculature. No aneurysm. No dissection flap. No periaortic fluid. Bilateral iliac and iliofemoral system patent with no occlusion or narrowing. 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 Celiac artery contributes to splenic artery, hepatic artery, left gastric artery. Enlargement of the superior pancreaticoduodenal arteries, which demonstrate significant anastomosis/flow from the superior mesenteric artery. Superior mesenteric artery widely patent with no stenosis. Inferior mesenteric artery is patent. Renal arteries are patent. IMPRESSION: Circumferential and diffuse gastric wall thickening, new from the CT of 2013. This is most compatible with nonspecific gastritis,  however, infiltrative process such as lymphoma could also have this appearance. Recommend correlation with upper endoscopy. 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 Both the superior mesenteric artery and inferior mesenteric artery contribute to collateralized flow. Although this pattern can be seen with chronic mesenteric ischemia, this is not favored as the etiology of the gastric wall thickening, as the collateral vessels fill normally with no occlusion identified. Centrilobular and paraseptal emphysema. Nodular density at the base the right lung is favored to be inflammatory, however, a follow-up chest CT in 3-6 months is recommended. Left inguinal hernia. 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 These results were called by  telephone at the time of interpretation on 09/23/2015 at 7:42 am to Dr. Varney Biles , who verbally acknowledged these results. Signed, Dulcy Fanny. Earleen Newport, DO [facility] Electronically Signed   By: Corrie Mckusick D.O.   On: 09/23/2015 07:42      Sherwood Gambler, MD 09/23/15 1704

## 2015-09-23 NOTE — ED Notes (Signed)
I have just called report to [facility] in [place] and will transport shortly.

## 2015-09-23 NOTE — H&P (Addendum)
History and Physical:    Duane Cuevas   [id number] DOB: August 05, 1955 DOA: 09/23/2015  Referring MD/provider: Dr. Kathrynn Humble PCP: No primary care provider on file.   Chief Complaint: Abdominal pain  History of Present Illness:   Duane Cuevas is an 60 y.o. male who is not under the care of her regular physician and who was recently hospitalized 09/19/15-09/21/15 for treatment of CAP.  He was discharged on several new medications including: Levaquin, metformin, and prednisone. He subsequently developed the acute onset of lower abdominal pain around 1 AM this morning, described as "burning", associated with N/V and 7 episodes of emesis. Thinks the emesis was dark on occasion.  No associated diarrhea.  No fever but has had chills. The ED physician reports that it took several doses of Dilaudid to get his pain under control, and the pain seemed to be out of proportion to radiographic imaging and physical exam findings. Patient appears to be comfortable at present, although he still endorses severe abdominal pain that has lessened since he has been given medications for pain. Because of the severity of his pain, CT angiogram of the abdomen and pelvis was done to rule out ischemia, and he was found to have thickening of stomach consistent with gastritis. Patient does endorse that he drinks alcohol regularly, up to a sixpack daily, but has not had anything to drink in the last 2 weeks. Lipase is not elevated.  ROS:   Review of Systems  Constitutional: Positive for chills and weight loss. Negative for fever and malaise/fatigue.  HENT:       Describes it as pressure like.  Eyes: Negative.   Respiratory: Positive for cough, sputum production and shortness of breath.        White-yellow sputum  Cardiovascular: Positive for chest pain.  Gastrointestinal: Positive for heartburn, nausea, vomiting and abdominal pain. Negative for diarrhea, blood in stool and melena.  Genitourinary: Positive for  dysuria.  Musculoskeletal: Positive for myalgias and joint pain.  Skin: Negative.   Neurological: Positive for headaches. Negative for weakness.  Endo/Heme/Allergies: Negative.   Psychiatric/Behavioral: Negative.      Past Medical History:   Past Medical History  Diagnosis Date  . Emphysema/COPD ([place]) 10/2014  . Diabetes mellitus ([place]) 10/2014  . Thrombocytopenia ([place]) 09/2015    platelets in 120s.     Past Surgical History:   Past Surgical History  Procedure Laterality Date  . Foot surgery      Social History:   Social History   Social History  . Marital Status: Married    Spouse Name: Ramonita Lab  . Number of Children: 6  . Years of Education: N/A   Occupational History  . Employed [employer]   Social History Main Topics  . Smoking status: Current Every Day Smoker -- 1.00 packs/day    Types: Cigarettes  . Smokeless tobacco: Never Used  . Alcohol Use: 3.6 oz/week    6 Cans of beer per week  . Drug Use: No  . Sexual Activity: No   Other Topics Concern  . Not on file   Social History Narrative   Lives with wife.  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    Family history:   Family History  Problem Relation Age of Onset  . Emphysema Mother   . Emphysema Father   . Diabetes Mother   . Cancer Sister     Stomach cancer    Allergies   Review of patient's allergies indicates no known  allergies.  Current Medications:   Prior to Admission medications   Medication Sig Start Date End Date Taking? Authorizing Provider  albuterol (PROVENTIL) (2.5 MG/3ML) 0.083% nebulizer solution Take 6 mLs (5 mg total) by nebulization every 6 (six) hours as needed for wheezing or shortness of breath. 03/03/15  Yes Etta Quill, NP  levofloxacin (LEVAQUIN) 750 MG tablet Take 1 tablet (750 mg total) by mouth daily. Please take for 3 days then stop 09/21/15  Yes Albertine Patricia, MD  metFORMIN (GLUCOPHAGE) 500 MG tablet Take 1 tablet (500 mg total) by mouth 2 (two)  times daily with a meal. 09/21/15  Yes Albertine Patricia, MD  predniSONE (DELTASONE) 10 MG tablet Please take 30 mg for 1 day, then 20 mg for 1 day, then 10 mg for 1 day then stop. 09/21/15  Yes Albertine Patricia, MD    Physical Exam:   Filed Vitals:   09/23/15 1550 09/23/15 1600 09/23/15 1610 09/23/15 1745  BP: 139/94 139/96 161/104 145/90  Pulse: 58 59 72 58  Temp:    97.9 F (36.6 C)  TempSrc:    Oral  Resp: 13 12 16 18   Height:      Weight:      SpO2: 95% 95% 94% 96%     Physical Exam: Blood pressure 145/90, pulse 58, temperature 97.9 F (36.6 C), temperature source Oral, resp. rate 18, height 5\' 9"  (1.753 m), weight 66.4 kg (146 lb 6.2 oz), SpO2 96 %. Gen: No acute distress. Head: Normocephalic, atraumatic. Eyes: PERRL, EOMI, sclerae nonicteric. Mouth: Oropharynx clear. Neck: Supple, no thyromegaly, no lymphadenopathy, no jugular venous distention. Chest: Lungs exam reveals bilateral faint expiratory wheezes. CV: Heart sounds are regular. No murmurs, rubs, or gallops. Abdomen: Soft, tender to palpation to the lower abdomen/guarding, with normal active bowel sounds. Extremities: Extremities are without clubbing, edema, or cyanosis. Skin: Warm and dry. Neuro: Alert and oriented times 3; grossly nonfocal. Psych: Mood and affect normal.   Data Review:    Labs: Basic Metabolic Panel:  Recent Labs Lab 09/19/15 2149 09/20/15 0509 09/21/15 0353 09/23/15 0506  NA 130* 132* 135 133*  K 3.7 3.9 3.6 3.4*  CL 92* 101 104 97*  CO2 26 22 23 26   GLUCOSE 179* 264* 200* 129*  BUN 9 9 15 16   CREATININE 0.91 0.75 0.71 0.70  CALCIUM 8.5* 8.4* 8.3* 8.7*   Liver Function Tests:  Recent Labs Lab 09/19/15 2149 09/23/15 0506  AST 32 34  ALT 25 48  ALKPHOS 53 52  BILITOT 0.4 1.4*  PROT 7.3 6.6  ALBUMIN 3.7 3.3*    Recent Labs Lab 09/19/15 2149 09/23/15 0506  LIPASE 29 26   CBC:  Recent Labs Lab 09/19/15 2149 09/20/15 0509 09/21/15 0353 09/23/15 0506    WBC 7.3 11.3* 14.5* 9.5  NEUTROABS  --   --   --  6.9  HGB 16.8 16.4 14.4 15.6  HCT 46.1 44.7 42.2 44.8  MCV 89.7 90.1 93.4 92.0  PLT 135* 122* 127* 233    Radiographic Studies: Dg Chest 2 View  09/23/2015  CLINICAL DATA:  60 year old male with abdominal pain and left-sided chest pain EXAM: CHEST  2 VIEW COMPARISON:  Radiograph dated 09/19/2015 a FINDINGS: Two views of the chest demonstrate emphysematous changes of the lungs. There is no focal consolidation, pleural effusion, or pneumothorax. The cardiac silhouette is within normal limits. There is mild diffuse increased interstitial prominence which may be related to underlying COPD changes or represent mild interstitial edema.  No acute osseous pathology identified. Left rotator cuff surgical pinning noted. IMPRESSION: Emphysema.  No focal consolidation or pneumothorax. Electronically Signed   By: Anner Crete M.D.   On: 09/23/2015 05:06   US Scrotum  09/23/2015  CLINICAL DATA:  60 year old with left-greater-than-right scrotal pain. EXAM: SCROTAL ULTRASOUND DOPPLER ULTRASOUND OF THE TESTICLES TECHNIQUE: Complete ultrasound examination of the testicles, epididymis, and other scrotal structures was performed. Color and spectral Doppler ultrasound were also utilized to evaluate blood flow to the testicles. FINDINGS: Right testicle Measurements: 4.9 x 2.5 x 3.2 cm. No mass or microlithiasis visualized. Normal blood flow with color Doppler. Left testicle Measurements: 3.2 x 2.1 x 3.5 cm. No mass or microlithiasis visualized. Normal blood flow with color Doppler. Right epididymis:  Normal in size and appearance. Left epididymis:  Normal in size and appearance. Hydrocele:  None visualized. Varicocele:  None visualized. Pulsed Doppler interrogation of both testes demonstrates normal low resistance arterial and venous waveforms bilaterally. IMPRESSION: Normal scrotal ultrasound. Both testes appear normal with normal blood flow. No evidence of testicular  torsion. Electronically Signed   By: Richardean Sale M.D.   On: 09/23/2015 09:08   [place] Art/ven Flow Abd Pelv Doppler  09/23/2015  CLINICAL DATA:  60 year old with left-greater-than-right scrotal pain. EXAM: SCROTAL ULTRASOUND DOPPLER ULTRASOUND OF THE TESTICLES TECHNIQUE: Complete ultrasound examination of the testicles, epididymis, and other scrotal structures was performed. Color and spectral Doppler ultrasound were also utilized to evaluate blood flow to the testicles. FINDINGS: Right testicle Measurements: 4.9 x 2.5 x 3.2 cm. No mass or microlithiasis visualized. Normal blood flow with color Doppler. Left testicle Measurements: 3.2 x 2.1 x 3.5 cm. No mass or microlithiasis visualized. Normal blood flow with color Doppler. Right epididymis:  Normal in size and appearance. Left epididymis:  Normal in size and appearance. Hydrocele:  None visualized. Varicocele:  None visualized. Pulsed Doppler interrogation of both testes demonstrates normal low resistance arterial and venous waveforms bilaterally. IMPRESSION: Normal scrotal ultrasound. Both testes appear normal with normal blood flow. No evidence of testicular torsion. Electronically Signed   By: Richardean Sale M.D.   On: 09/23/2015 09:08   Ct Angio Abd/pel W/ And/or W/o  09/23/2015  CLINICAL DATA:  60 year old male with a history of abdominal pain for several hours. EXAM: CT ABDOMEN AND PELVIS WITH CONTRAST TECHNIQUE: Multidetector CT imaging of the abdomen and pelvis was performed using the standard protocol following bolus administration of intravenous contrast. Re-formatted images in coronal and sagittal plane performed on a separate workstation with both arterial phase and delayed phase. CONTRAST:  188mL OMNIPAQUE IOHEXOL 350 MG/ML SOLN COMPARISON:  04/13/2012 FINDINGS: Lower chest: Unremarkable appearance of the soft tissues of the chest wall. Heart size within normal limits.  No pericardial fluid/thickening. No lower mediastinal adenopathy.  Unremarkable appearance of the distal esophagus. No hiatal hernia. Extensive changes of paraseptal and centrilobular emphysema. Motion artifact somewhat limits evaluation. Scarring/atelectasis of the lung bases. Nodular opacity at the right posterior base. Abdomen/pelvis: Unremarkable appearance of liver. Unremarkable appearance of spleen. Unremarkable gallbladder. Unremarkable appearance of pancreas. Unremarkable bilateral adrenal glands. No free fluid or free air within the mesenteric. Unremarkable appearance of the bilateral kidneys. Unremarkable course of the ureters. Small anterior right diverticulum of the urinary bladder which is relatively decompressed. Compared to the prior CT there is interval development of circumferential and diffuse gastric wall thickening involving cardia, fundus, antral region. No significant inflammatory changes of the associated mesenteric. No evidence of perforation. No abnormally distended small bowel or colon. 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 Small fact containing left inguinal hernia. Musculoskeletal: Mild changes of degenerative disc disease.  No bony canal narrowing. Vascular: Minimal atherosclerotic changes of the visualized vasculature. No aneurysm. No dissection flap. No periaortic fluid. Bilateral iliac and iliofemoral system patent with no occlusion or narrowing. 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 Celiac artery contributes to splenic artery, hepatic artery, left gastric artery. Enlargement of the superior pancreaticoduodenal arteries, which demonstrate significant anastomosis/flow from the superior mesenteric artery. Superior mesenteric artery widely patent with no stenosis. Inferior mesenteric artery is patent. Renal arteries are patent. IMPRESSION: Circumferential and diffuse gastric wall thickening, new from the CT of 2013. This is most compatible  with nonspecific gastritis, however, infiltrative process such as lymphoma could also have this appearance. Recommend correlation with upper endoscopy. 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 Both the superior mesenteric artery and inferior mesenteric artery contribute to collateralized flow. Although this pattern can be seen with chronic mesenteric ischemia, this is not favored as the etiology of the gastric wall thickening, as the collateral vessels fill normally with no occlusion identified. Centrilobular and paraseptal emphysema. Nodular density at the base the right lung is favored to be inflammatory, however, a follow-up chest CT in 3-6 months is recommended. Left inguinal hernia. 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 These results were called by telephone at the time of interpretation on 09/23/2015 at 7:42 am to Dr. Varney Biles , who verbally acknowledged these results. Signed, Dulcy Fanny. Earleen Newport, DO [facility] Electronically Signed   By: Corrie Mckusick D.O.   On: 09/23/2015 07:42   *I have personally reviewed the images above*  EKG: Independently reviewed. NSR at 72 bpm, biatrial enlargement, no significant change from prior.   Assessment/Plan:   Principal Problem:   Gastritis - May be from prednisone and history of daily alcohol use. - Given family history of gastric cancer, will consult GI for consideration of inpatient EGD. - Start twice a day PPI therapy. Hold further prednisone. - Continue pain control efforts and antinausea medications as needed.  Active Problems:   Hypokalemia - We'll add potassium to IV fluids.    COPD (chronic obstructive pulmonary disease) (HCC) - Bronchodilators ordered as needed.    CAP (community acquired pneumonia) - Continue Levaquin but change to IV route given GI symptoms.    Diabetes mellitus ([place]) - Hold metformin.  Start SSI, insulin sensitive scale, every 4 hours while nothing by mouth.    DVT prophylaxis - Lovenox ordered.  Code Status / Family Communication / Disposition Plan:   Code Status: Full. Family Communication: Wife and daughter at the bedside. Disposition Plan: Home when stable possibly 09/24/15 if EGD done and negative for significant findings.   Time spent: One hour.  Hertha Gergen [facility] Pager [phone] Cell: [phone]   If 7PM-7AM, please contact night-coverage [email or web address] Password [email or web address] 09/23/2015, 6:13 PM

## 2015-09-24 ENCOUNTER — Encounter (HOSPITAL_COMMUNITY): Payer: Self-pay | Admitting: Gastroenterology

## 2015-09-24 DIAGNOSIS — R109 Unspecified abdominal pain: Secondary | ICD-10-CM

## 2015-09-24 DIAGNOSIS — E876 Hypokalemia: Secondary | ICD-10-CM

## 2015-09-24 DIAGNOSIS — E119 Type 2 diabetes mellitus without complications: Secondary | ICD-10-CM

## 2015-09-24 DIAGNOSIS — K2970 Gastritis, unspecified, without bleeding: Secondary | ICD-10-CM

## 2015-09-24 LAB — BASIC METABOLIC PANEL
Anion gap: 7 (ref 5–15)
BUN: 12 mg/dL (ref 6–20)
CHLORIDE: 100 mmol/L — AB (ref 101–111)
CO2: 28 mmol/L (ref 22–32)
CREATININE: 0.66 mg/dL (ref 0.61–1.24)
Calcium: 8.1 mg/dL — ABNORMAL LOW (ref 8.9–10.3)
GFR calc Af Amer: >60 mL/min (ref >60)
GLUCOSE: 81 mg/dL (ref 65–99)
Potassium: 4.2 mmol/L (ref 3.5–5.1)
SODIUM: 135 mmol/L (ref 135–145)

## 2015-09-24 LAB — CBC
HEMATOCRIT: 43 % (ref 39.0–52.0)
Hemoglobin: 15 g/dL (ref 13.0–17.0)
MCH: 31.6 pg (ref 26.0–34.0)
MCHC: 34.9 g/dL (ref 30.0–36.0)
MCV: 90.5 fL (ref 78.0–100.0)
PLATELETS: 255 10*3/uL (ref 150–400)
RBC: 4.75 MIL/uL (ref 4.22–5.81)
RDW: 12.4 % (ref 11.5–15.5)
WBC: 7 10*3/uL (ref 4.0–10.5)

## 2015-09-24 LAB — GLUCOSE, CAPILLARY
GLUCOSE-CAPILLARY: 92 mg/dL (ref 65–99)
Glucose-Capillary: 110 mg/dL — ABNORMAL HIGH (ref 65–99)

## 2015-09-24 MED ORDER — INSULIN ASPART 100 UNIT/ML ~~LOC~~ SOLN
0.0000 [IU] | Freq: Three times a day (TID) | SUBCUTANEOUS | Status: DC
  Administered 2015-09-25: 2 [IU] via SUBCUTANEOUS

## 2015-09-24 MED ORDER — INSULIN ASPART 100 UNIT/ML ~~LOC~~ SOLN: 0.0000 [IU] | Freq: Every day | SUBCUTANEOUS | Status: DC

## 2015-09-24 NOTE — Progress Notes (Signed)
Inpatient Diabetes Program Recommendations  AACE/ADA: New Consensus Statement on Inpatient Glycemic Control (2015)  Target Ranges:  Prepandial:   less than 140 mg/dL      Peak postprandial:   less than 180 mg/dL (1-2 hours)      Critically ill patients:  140 - 180 mg/dL   Review of Glycemic Control  Diabetes history: DM Type 2 Outpatient Diabetes medications: Metformin Current orders for Inpatient glycemic control: None  Inpatient Diabetes Program Recommendations:  Note patient's complaints of abdominal pain and hx of ETOH intake. A1c 6.5.  Please consider CBGs with meals and hs with Novolog Sensitive scale correction while in the hospital and discontinue Metformin home meds.  Thank you, Nani Gasser. Zair Borawski, RN, MSN, CDE Inpatient Glycemic Control Team Team Pager [phone] (8am-5pm) 09/24/2015 11:16 AM

## 2015-09-24 NOTE — Progress Notes (Signed)
CM notes pt has [place] 09/25/15 at 5:00pm with Francis Dowse.  Pt speaks minimal English but states he knows about appt at [facility];  gave pt [facility] pamphlet with appt time on pamphlet and appt is on AVS.  No other CM needs were communicated.

## 2015-09-24 NOTE — Op Note (Signed)
[facility] Patient Name: Duane Cuevas Procedure Date: 09/23/2015 MRN: [id number] Attending MD: Ladene Artist , MD Date of Birth: 07/23/1955 CSN:  Age: 59 Admit Type: Inpatient Procedure:                Upper GI endoscopy Indications:              Persistent vomiting of unknown cause, Endoscopy to                            confirm suspected neoplastic lesion of the stomach                            seen on previous imaging study, Generalized                            abdominal pain Providers:                Pricilla Riffle. Fuller Plan, MD, Dustin Flock, RN, Corliss Parish, Technician, Despina Pole, Technician Referring MD:             [facility] Medicines:                Fentanyl 25 micrograms IV, Midazolam 2 mg IV,                            Diphenhydramine 50 mg IV Complications:            No immediate complications. Estimated Blood Loss:     Estimated blood loss was minimal. Procedure:                Pre-Anesthesia Assessment:                           - Prior to the procedure, a History and Physical                            was performed, and patient medications and                            allergies were reviewed. The patient's tolerance of                            previous anesthesia was also reviewed. The risks                            and benefits of the procedure and the sedation                            options and risks were discussed with the patient.                            All questions were answered, and informed consent  was obtained. Prior Anticoagulants: The patient has                            taken no previous anticoagulant or antiplatelet                            agents. ASA Grade Assessment: III - A patient with                            severe systemic disease. After reviewing the risks                            and benefits, the patient was deemed in                             satisfactory condition to undergo the procedure.                           After obtaining informed consent, the endoscope was                            passed under direct vision. Throughout the                            procedure, the patient's blood pressure, pulse, and                            oxygen saturations were monitored continuously. The                            [id number]) scope was introduced through the                            mouth, and advanced to the second part of duodenum.                            The upper GI endoscopy was accomplished without                            difficulty. The patient tolerated the procedure                            well. Findings:      Multiple dispersed, non-bleeding erosions were found in the gastric       body, fundus and antrum. The gastric folds appeared mildly edematous.       There were no stigmata of recent bleeding. Biopsies were taken with a       cold forceps for histology. The stomach otherwise appeared normal.      Diffuse candidiasis was found in the entire of the esophagus. No       biopsies or other specimens were collected for this exam. The esophagus       otherwise appeared normal.      The duodenal bulb and second portion of the duodenum were normal.  The cardia and gastric fundus were normal on retroflexion. Impression:               - Non-bleeding diffuse erosive gastropathy.                            Biopsied.                           - Monilial esophagitis. No specimens collected.                           - Normal duodenal bulb and second portion of the                            duodenum. Moderate Sedation:      Moderate (conscious) sedation was administered by the endoscopy nurse       and supervised by the endoscopist. The following parameters were       monitored: oxygen saturation, heart rate, blood pressure, and response       to care. Total physician intraservice time was  22 minutes. Recommendation:           - Return patient to hospital ward for ongoing care.                           - Clear liquid diet PRN.                           - Continue present medications.                           - Await pathology results.                           - Diflucan 100 mg po qd for 7 days Procedure Code(s):        --- Professional ---                           [phone], Esophagogastroduodenoscopy, flexible,                            transoral; with biopsy, single or multiple                           99152, Moderate sedation services provided by the                            same physician or other qualified health care                            professional performing the diagnostic or                            therapeutic service that the sedation supports,                            requiring the presence of an independent trained  observer to assist in the monitoring of the                            patient's level of consciousness and physiological                            status; initial 15 minutes of intraservice time,                            patient age 67 years or older Diagnosis Code(s):        --- Professional ---                           K31.89, Other diseases of stomach and duodenum                           B37.81, Candidal esophagitis                           R11.10, Vomiting, unspecified                           R10.84, Generalized abdominal pain                           R93.3, Abnormal findings on diagnostic imaging of                            other parts of digestive tract CPT copyright 2016 [employer]. All rights reserved. The codes documented in this report are preliminary and upon coder review may  be revised to meet current compliance requirements. Pricilla Riffle. Fuller Plan, MD Ladene Artist, MD 09/23/2015 3:24:43 PM This report has been signed electronically. Number of Addenda: 0

## 2015-09-24 NOTE — Progress Notes (Signed)
Patient ID: Duane Cuevas, male   DOB: 31-Oct-1955, 60 y.o.   MRN: [id number]    Progress Note   Subjective  Feels very good today -pain much much better-had some pain med about 4 am but says pain was a 2-3 compared to 10 yesterday . Tolerating clear liquids. Still has some pain very low abdomen.   Objective   Vital signs in last 24 hours: Temp:  [97.5 F (36.4 C)-98 F (36.7 C)] 97.8 F (36.6 C) (03/23 0814) Pulse Rate:  [58-76] 71 (03/23 0814) Resp:  [11-18] 18 (03/23 0814) BP: (130-188)/(78-112) 130/78 mmHg (03/23 0814) SpO2:  [91 %-98 %] 91 % (03/23 0814) Weight:  [146 lb 6.2 oz (66.4 kg)] 146 lb 6.2 oz (66.4 kg) (03/22 1151) Last BM Date: 09/23/15 General:    Hispanic male in NAD, comfortable appearing Heart:  Regular rate and rhythm; no murmurs Lungs: Respirations even and unlabored, lungs CTA bilaterally Abdomen:  Soft, mildly tenderacross low abdomen and nondistended. Normal bowel sounds.- no guarding Extremities:  Without edema. Neurologic:  Alert and oriented,  grossly normal neurologically. Psych:  Cooperative. Normal mood and affect.  Intake/Output from previous day: 03/22 0701 - 03/23 0700 In: 1030 [P.O.:1030] Out: 2150 [Urine:2150] Intake/Output this shift: Total I/O In: -  Out: 750 [Urine:750]  Lab Results:  Recent Labs  09/23/15 0506 09/24/15 0522  WBC 9.5 7.0  HGB 15.6 15.0  HCT 44.8 43.0  PLT 233 255   BMET  Recent Labs  09/23/15 0506 09/24/15 0522  NA 133* 135  K 3.4* 4.2  CL 97* 100*  CO2 26 28  GLUCOSE 129* 81  BUN 16 12  CREATININE 0.70 0.66  CALCIUM 8.7* 8.1*   LFT  Recent Labs  09/23/15 0506  PROT 6.6  ALBUMIN 3.3*  AST 34  ALT 48  ALKPHOS 52  BILITOT 1.4*   PT/INR No results for input(s): LABPROT, INR in the last 72 hours.  Studies/Results: Dg Chest 2 View  09/23/2015  CLINICAL DATA:  60 year old male with abdominal pain and left-sided chest pain EXAM: CHEST  2 VIEW COMPARISON:  Radiograph dated 09/19/2015 a  FINDINGS: Two views of the chest demonstrate emphysematous changes of the lungs. There is no focal consolidation, pleural effusion, or pneumothorax. The cardiac silhouette is within normal limits. There is mild diffuse increased interstitial prominence which may be related to underlying COPD changes or represent mild interstitial edema. No acute osseous pathology identified. Left rotator cuff surgical pinning noted. IMPRESSION: Emphysema.  No focal consolidation or pneumothorax. Electronically Signed   By: Anner Crete M.D.   On: 09/23/2015 05:06   US Scrotum  09/23/2015  CLINICAL DATA:  60 year old with left-greater-than-right scrotal pain. EXAM: SCROTAL ULTRASOUND DOPPLER ULTRASOUND OF THE TESTICLES TECHNIQUE: Complete ultrasound examination of the testicles, epididymis, and other scrotal structures was performed. Color and spectral Doppler ultrasound were also utilized to evaluate blood flow to the testicles. FINDINGS: Right testicle Measurements: 4.9 x 2.5 x 3.2 cm. No mass or microlithiasis visualized. Normal blood flow with color Doppler. Left testicle Measurements: 3.2 x 2.1 x 3.5 cm. No mass or microlithiasis visualized. Normal blood flow with color Doppler. Right epididymis:  Normal in size and appearance. Left epididymis:  Normal in size and appearance. Hydrocele:  None visualized. Varicocele:  None visualized. Pulsed Doppler interrogation of both testes demonstrates normal low resistance arterial and venous waveforms bilaterally. IMPRESSION: Normal scrotal ultrasound. Both testes appear normal with normal blood flow. No evidence of testicular torsion. Electronically Signed   By: Gwyndolyn Saxon  Lin Landsman M.D.   On: 09/23/2015 09:08   [place] Art/ven Flow Abd Pelv Doppler  09/23/2015  CLINICAL DATA:  60 year old with left-greater-than-right scrotal pain. EXAM: SCROTAL ULTRASOUND DOPPLER ULTRASOUND OF THE TESTICLES TECHNIQUE: Complete ultrasound examination of the testicles, epididymis, and other scrotal  structures was performed. Color and spectral Doppler ultrasound were also utilized to evaluate blood flow to the testicles. FINDINGS: Right testicle Measurements: 4.9 x 2.5 x 3.2 cm. No mass or microlithiasis visualized. Normal blood flow with color Doppler. Left testicle Measurements: 3.2 x 2.1 x 3.5 cm. No mass or microlithiasis visualized. Normal blood flow with color Doppler. Right epididymis:  Normal in size and appearance. Left epididymis:  Normal in size and appearance. Hydrocele:  None visualized. Varicocele:  None visualized. Pulsed Doppler interrogation of both testes demonstrates normal low resistance arterial and venous waveforms bilaterally. IMPRESSION: Normal scrotal ultrasound. Both testes appear normal with normal blood flow. No evidence of testicular torsion. Electronically Signed   By: Richardean Sale M.D.   On: 09/23/2015 09:08   Ct Angio Abd/pel W/ And/or W/o  09/23/2015  CLINICAL DATA:  60 year old male with a history of abdominal pain for several hours. EXAM: CT ABDOMEN AND PELVIS WITH CONTRAST TECHNIQUE: Multidetector CT imaging of the abdomen and pelvis was performed using the standard protocol following bolus administration of intravenous contrast. Re-formatted images in coronal and sagittal plane performed on a separate workstation with both arterial phase and delayed phase. CONTRAST:  161mL OMNIPAQUE IOHEXOL 350 MG/ML SOLN COMPARISON:  04/13/2012 FINDINGS: Lower chest: Unremarkable appearance of the soft tissues of the chest wall. Heart size within normal limits.  No pericardial fluid/thickening. No lower mediastinal adenopathy. Unremarkable appearance of the distal esophagus. No hiatal hernia. Extensive changes of paraseptal and centrilobular emphysema. Motion artifact somewhat limits evaluation. Scarring/atelectasis of the lung bases. Nodular opacity at the right posterior base. Abdomen/pelvis: Unremarkable appearance of liver. Unremarkable appearance of spleen. Unremarkable  gallbladder. Unremarkable appearance of pancreas. Unremarkable bilateral adrenal glands. No free fluid or free air within the mesenteric. Unremarkable appearance of the bilateral kidneys. Unremarkable course of the ureters. Small anterior right diverticulum of the urinary bladder which is relatively decompressed. Compared to the prior CT there is interval development of circumferential and diffuse gastric wall thickening involving cardia, fundus, antral region. No significant inflammatory changes of the associated mesenteric. No evidence of perforation. No abnormally distended small bowel or colon. 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 Small fact containing left inguinal hernia. Musculoskeletal: Mild changes of degenerative disc disease.  No bony canal narrowing. Vascular: Minimal atherosclerotic changes of the visualized vasculature. No aneurysm. No dissection flap. No periaortic fluid. Bilateral iliac and iliofemoral system patent with no occlusion or narrowing. 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 Celiac artery contributes to splenic artery, hepatic artery, left gastric artery. Enlargement of the superior pancreaticoduodenal arteries, which demonstrate significant anastomosis/flow from the superior mesenteric artery. Superior mesenteric artery widely patent with no stenosis. Inferior mesenteric artery is patent. Renal arteries are patent. IMPRESSION: Circumferential and diffuse gastric wall thickening, new from the CT of 2013. This is most compatible with nonspecific gastritis, however, infiltrative process such as lymphoma could also have this appearance. Recommend correlation with upper endoscopy. 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 Both the superior mesenteric artery and inferior mesenteric artery contribute to collateralized flow. Although this pattern  can be seen with chronic mesenteric ischemia, this is not favored as the etiology of  the gastric wall thickening, as the collateral vessels fill normally with no occlusion identified. Centrilobular and paraseptal emphysema. Nodular density at the base the right lung is favored to be inflammatory, however, a follow-up chest CT in 3-6 months is recommended. Left inguinal hernia. 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 These results were called by telephone at the time of interpretation on 09/23/2015 at 7:42 am to Dr. Varney Biles , who verbally acknowledged these results. Signed, Dulcy Fanny. Earleen Newport, DO [facility] Electronically Signed   By: Corrie Mckusick D.O.   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 EGD with edematous stomach ? early ischemia. Bx pending He is much better today, minimal discomfort. Abdomen pain markedly improved so will not involve general surgery today. Vascular surgery consult reviewed. Advance to full liquids, observe, await gastric bx.    Principal Problem:   Gastritis Active Problems:   COPD (chronic obstructive pulmonary disease) ([place])   CAP (community acquired pneumonia)   Diabetes mellitus ([place])   Hypokalemia     Amy Esterwood  09/24/2015, 9:10 AM     Attending physician's note   I have taken an interval history, reviewed the chart and examined the patient. I agree with the Advanced Practitioner's note, impression and recommendations. Abdominal pain markedly improved. Diffuse gastritis on EGD with biopsies pending. Continue PPI bid for now. Vascular surgery consult reviewed and case discussed with Dr. Kellie Simmering. 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 Will discuss with IR. No plans for gen surgery consult at this time as his pain has almost resolved. Advance diet and observe.   Lucio Edward, MD [facility] [phone] Mon-Fri 8a-5p [phone] after 5p, weekends, holidays

## 2015-09-24 NOTE — Progress Notes (Signed)
PROGRESS NOTE  Duane Cuevas [id number] DOB: March 02, 1956 DOA: 09/23/2015 PCP: No primary care provider on file.  Assessment/Plan: Gastritis - May be from prednisone and history of daily alcohol use. - Given family history of gastric cancer,  - s/p EGD with biopsy - Start twice a day PPI therapy. Hold further prednisone. -virtually pain free  occlusion of celiac axis -seen by vascular: No evidence of atherosclerosis with widely patent SMA and IMA and good collateral flow to splenic and hepatic arteries from SMA.  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 Could be due to compression by median arcuate ligament which caused slow chronic occlusion Has no history to suggest that he has had previous emboli or arrhythmias -GI to discuss with IR   Hypokalemia - replace   COPD (chronic obstructive pulmonary disease) (HCC) - Bronchodilators ordered as needed.   CAP (community acquired pneumonia) - Continue Levaquin but change to IV route given GI symptoms.   Diabetes mellitus ([place]) - Hold metformin -SSI   Code Status: full Family Communication: family at bedside Disposition Plan:   Consultants:    Procedures:      HPI/Subjective: Eating Limited abdominal pain  Objective: Filed Vitals:   09/23/15 2114 09/24/15 0814  BP: 134/97 130/78  Pulse: 63 71  Temp: 97.7 F (36.5 C) 97.8 F (36.6 C)  Resp: 16 18    Intake/Output Summary (Last 24 hours) at 09/24/15 1225 Last data filed at 09/24/15 1120  Gross per 24 hour  Intake   1030 ml  Output   3000 ml  Net  -1970 ml   Filed Weights   09/23/15 0409 09/23/15 1151  Weight: 65.318 kg (144 lb) 66.4 kg (146 lb 6.2 oz)    Exam:   General:  Awake, NAD  Cardiovascular: rrr  Respiratory: clear  Abdomen: +BS, soft  Musculoskeletal: no edema  Data Reviewed: Basic Metabolic Panel:  Recent Labs Lab 09/19/15 2149 09/20/15 0509 09/21/15 0353  09/23/15 0506 09/24/15 0522  NA 130* 132* 135 133* 135  K 3.7 3.9 3.6 3.4* 4.2  CL 92* 101 104 97* 100*  CO2 26 22 23 26 28   GLUCOSE 179* 264* 200* 129* 81  BUN 9 9 15 16 12   CREATININE 0.91 0.75 0.71 0.70 0.66  CALCIUM 8.5* 8.4* 8.3* 8.7* 8.1*   Liver Function Tests:  Recent Labs Lab 09/19/15 2149 09/23/15 0506  AST 32 34  ALT 25 48  ALKPHOS 53 52  BILITOT 0.4 1.4*  PROT 7.3 6.6  ALBUMIN 3.7 3.3*    Recent Labs Lab 09/19/15 2149 09/23/15 0506  LIPASE 29 26   No results for input(s): AMMONIA in the last 168 hours. CBC:  Recent Labs Lab 09/19/15 2149 09/20/15 0509 09/21/15 0353 09/23/15 0506 09/24/15 0522  WBC 7.3 11.3* 14.5* 9.5 7.0  NEUTROABS  --   --   --  6.9  --   HGB 16.8 16.4 14.4 15.6 15.0  HCT 46.1 44.7 42.2 44.8 43.0  MCV 89.7 90.1 93.4 92.0 90.5  PLT 135* 122* 127* 233 255   Cardiac Enzymes: No results for input(s): CKTOTAL, CKMB, CKMBINDEX, TROPONINI in the last 168 hours. BNP (last 3 results) No results for input(s): BNP in the last 8760 hours.  ProBNP (last 3 results) No results for input(s): PROBNP in the last 8760 hours.  CBG: No results for input(s): GLUCAP in the last 168 hours.  No results found for this or any previous visit (from the past  240 hour(s)).   Studies: Dg Chest 2 View  09/23/2015  CLINICAL DATA:  60 year old male with abdominal pain and left-sided chest pain EXAM: CHEST  2 VIEW COMPARISON:  Radiograph dated 09/19/2015 a FINDINGS: Two views of the chest demonstrate emphysematous changes of the lungs. There is no focal consolidation, pleural effusion, or pneumothorax. The cardiac silhouette is within normal limits. There is mild diffuse increased interstitial prominence which may be related to underlying COPD changes or represent mild interstitial edema. No acute osseous pathology identified. Left rotator cuff surgical pinning noted. IMPRESSION: Emphysema.  No focal consolidation or pneumothorax. Electronically Signed   By:  Anner Crete M.D.   On: 09/23/2015 05:06   US Scrotum  09/23/2015  CLINICAL DATA:  60 year old with left-greater-than-right scrotal pain. EXAM: SCROTAL ULTRASOUND DOPPLER ULTRASOUND OF THE TESTICLES TECHNIQUE: Complete ultrasound examination of the testicles, epididymis, and other scrotal structures was performed. Color and spectral Doppler ultrasound were also utilized to evaluate blood flow to the testicles. FINDINGS: Right testicle Measurements: 4.9 x 2.5 x 3.2 cm. No mass or microlithiasis visualized. Normal blood flow with color Doppler. Left testicle Measurements: 3.2 x 2.1 x 3.5 cm. No mass or microlithiasis visualized. Normal blood flow with color Doppler. Right epididymis:  Normal in size and appearance. Left epididymis:  Normal in size and appearance. Hydrocele:  None visualized. Varicocele:  None visualized. Pulsed Doppler interrogation of both testes demonstrates normal low resistance arterial and venous waveforms bilaterally. IMPRESSION: Normal scrotal ultrasound. Both testes appear normal with normal blood flow. No evidence of testicular torsion. Electronically Signed   By: Richardean Sale M.D.   On: 09/23/2015 09:08   [place] Art/ven Flow Abd Pelv Doppler  09/23/2015  CLINICAL DATA:  60 year old with left-greater-than-right scrotal pain. EXAM: SCROTAL ULTRASOUND DOPPLER ULTRASOUND OF THE TESTICLES TECHNIQUE: Complete ultrasound examination of the testicles, epididymis, and other scrotal structures was performed. Color and spectral Doppler ultrasound were also utilized to evaluate blood flow to the testicles. FINDINGS: Right testicle Measurements: 4.9 x 2.5 x 3.2 cm. No mass or microlithiasis visualized. Normal blood flow with color Doppler. Left testicle Measurements: 3.2 x 2.1 x 3.5 cm. No mass or microlithiasis visualized. Normal blood flow with color Doppler. Right epididymis:  Normal in size and appearance. Left epididymis:  Normal in size and appearance. Hydrocele:  None visualized.  Varicocele:  None visualized. Pulsed Doppler interrogation of both testes demonstrates normal low resistance arterial and venous waveforms bilaterally. IMPRESSION: Normal scrotal ultrasound. Both testes appear normal with normal blood flow. No evidence of testicular torsion. Electronically Signed   By: Richardean Sale M.D.   On: 09/23/2015 09:08   Ct Angio Abd/pel W/ And/or W/o  09/23/2015  CLINICAL DATA:  60 year old male with a history of abdominal pain for several hours. EXAM: CT ABDOMEN AND PELVIS WITH CONTRAST TECHNIQUE: Multidetector CT imaging of the abdomen and pelvis was performed using the standard protocol following bolus administration of intravenous contrast. Re-formatted images in coronal and sagittal plane performed on a separate workstation with both arterial phase and delayed phase. CONTRAST:  167mL OMNIPAQUE IOHEXOL 350 MG/ML SOLN COMPARISON:  04/13/2012 FINDINGS: Lower chest: Unremarkable appearance of the soft tissues of the chest wall. Heart size within normal limits.  No pericardial fluid/thickening. No lower mediastinal adenopathy. Unremarkable appearance of the distal esophagus. No hiatal hernia. Extensive changes of paraseptal and centrilobular emphysema. Motion artifact somewhat limits evaluation. Scarring/atelectasis of the lung bases. Nodular opacity at the right posterior base. Abdomen/pelvis: Unremarkable appearance of liver. Unremarkable appearance of spleen. Unremarkable  gallbladder. Unremarkable appearance of pancreas. Unremarkable bilateral adrenal glands. No free fluid or free air within the mesenteric. Unremarkable appearance of the bilateral kidneys. Unremarkable course of the ureters. Small anterior right diverticulum of the urinary bladder which is relatively decompressed. Compared to the prior CT there is interval development of circumferential and diffuse gastric wall thickening involving cardia, fundus, antral region. No significant inflammatory changes of the associated  mesenteric. No evidence of perforation. No abnormally distended small bowel or colon. 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 Small fact containing left inguinal hernia. Musculoskeletal: Mild changes of degenerative disc disease.  No bony canal narrowing. Vascular: Minimal atherosclerotic changes of the visualized vasculature. No aneurysm. No dissection flap. No periaortic fluid. Bilateral iliac and iliofemoral system patent with no occlusion or narrowing. 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 Celiac artery contributes to splenic artery, hepatic artery, left gastric artery. Enlargement of the superior pancreaticoduodenal arteries, which demonstrate significant anastomosis/flow from the superior mesenteric artery. Superior mesenteric artery widely patent with no stenosis. Inferior mesenteric artery is patent. Renal arteries are patent. IMPRESSION: Circumferential and diffuse gastric wall thickening, new from the CT of 2013. This is most compatible with nonspecific gastritis, however, infiltrative process such as lymphoma could also have this appearance. Recommend correlation with upper endoscopy. 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 Both the superior mesenteric artery and inferior mesenteric artery contribute to collateralized flow. Although this pattern can be seen with chronic mesenteric ischemia, this is not favored as the etiology of the gastric wall thickening, as the collateral vessels fill normally with no occlusion identified. Centrilobular and paraseptal emphysema. Nodular density at the base the right lung is favored to be inflammatory, however, a follow-up chest CT in 3-6 months is recommended. Left inguinal hernia. 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 These results were called by telephone at the time of interpretation on  09/23/2015 at 7:42 am to Dr. Varney Biles , who verbally acknowledged these results. Signed, Dulcy Fanny. Earleen Newport, DO [facility] Electronically Signed   By: Corrie Mckusick D.O.   On: 09/23/2015 07:42    Scheduled Meds: . enoxaparin (LOVENOX) injection  40 mg Subcutaneous Q24H  . fluconazole (DIFLUCAN) IV  100 mg Intravenous Q24H  . insulin aspart  0-15 Units Subcutaneous TID WC  . insulin aspart  0-5 Units Subcutaneous QHS  . levofloxacin (LEVAQUIN) IV  750 mg Intravenous Q24H  . pantoprazole (PROTONIX) IV  40 mg Intravenous Q12H   Continuous Infusions:  Antibiotics Given (last 72 hours)    Date/Time Action Medication Dose Rate   09/23/15 1232 Given   levofloxacin (LEVAQUIN) IVPB 750 mg 750 mg 100 mL/hr      Principal Problem:   Gastritis Active Problems:   COPD (chronic obstructive pulmonary disease) ([place])   CAP (community acquired pneumonia)   Diabetes mellitus ([place])   Hypokalemia    Time spent: 25 min    [place] [facility] Pager [phone] If 7PM-7AM, please contact night-coverage at [email or web address], password [facility] 09/24/2015, 12:25 PM

## 2015-09-25 ENCOUNTER — Inpatient Hospital Stay: Payer: Self-pay

## 2015-09-25 DIAGNOSIS — J449 Chronic obstructive pulmonary disease, unspecified: Secondary | ICD-10-CM

## 2015-09-25 DIAGNOSIS — B9681 Helicobacter pylori [H. pylori] as the cause of diseases classified elsewhere: Secondary | ICD-10-CM

## 2015-09-25 LAB — GLUCOSE, CAPILLARY
Glucose-Capillary: 104 mg/dL — ABNORMAL HIGH (ref 65–99)
Glucose-Capillary: 145 mg/dL — ABNORMAL HIGH (ref 65–99)

## 2015-09-25 MED ORDER — FLUCONAZOLE 100 MG PO TABS: 100.0000 mg | ORAL_TABLET | Freq: Every day | ORAL | Status: DC

## 2015-09-25 MED ORDER — AMOXICILL-CLARITHRO-LANSOPRAZ PO MISC: Freq: Two times a day (BID) | ORAL | Status: DC

## 2015-09-25 MED ORDER — LANSOPRAZOLE 30 MG PO CPDR: 30.0000 mg | DELAYED_RELEASE_CAPSULE | Freq: Every day | ORAL | Status: DC

## 2015-09-25 NOTE — Care Management Note (Signed)
Case Management Note  Patient Details  Name: Duane Cuevas MRN: [id number] Date of Birth: 04-23-1956  Subjective/Objective:    Patient for d/c-[facility] appt already set up for today @ 5p see d/c instruction f/u sheet.                Action/Plan:d/c home.   Expected Discharge Date:   (UNKNOWN)               Expected Discharge Plan:  Home/Self Care  In-House Referral:     Discharge planning Services  CM Consult, [facility]  Post Acute Care Choice:    Choice offered to:     DME Arranged:    DME Agency:     HH Arranged:    [place] Agency:     Status of Service:  Completed, signed off  Medicare Important Message Given:    Date Medicare IM Given:    Medicare IM give by:    Date Additional Medicare IM Given:    Additional Medicare Important Message give by:     If discussed at [place] of Stay Meetings, dates discussed:    Additional Comments:  Dessa Phi, RN 09/25/2015, 12:53 PM

## 2015-09-25 NOTE — Progress Notes (Signed)
Patient notified of discharge.  Educated on discharge instruction, follow-up medications, and follow-up appointment.  Patient stated understanding and agreed.  AVS signed.  Prescriptions given to patient.  Patient's appointment at [facility] rescheduled by them for 09/30/15 due to patient still being hospitalized.  Notified patient of date change and he stated understanding. IV removed.  No questions or concerns at this time.

## 2015-09-25 NOTE — Progress Notes (Signed)
Patient ID: Duane Cuevas, male   DOB: [birth year], 60 y.o.   MRN: [id number]    Progress Note   Subjective   Tolerating pos without difficulty, took some pain medicine last evening- none since  Says pain almost gone 2/10 at most   Objective   Vital signs in last 24 hours: Temp:  [97.9 F (36.6 C)-98.4 F (36.9 C)] 98.4 F (36.9 C) (03/24 0615) Pulse Rate:  [62-97] 89 (03/24 0615) Resp:  [18] 18 (03/24 0615) BP: (114-158)/(77-94) 121/77 mmHg (03/24 0615) SpO2:  [93 %-100 %] 93 % (03/24 0615) Last BM Date: 09/23/15 General:   Hispanic male in NAD Heart:  Regular rate and rhythm; no murmurs Lungs: Respirations even and unlabored, lungs CTA bilaterally Abdomen:  Soft, basically nontender, and nondistended. Normal bowel sounds. Extremities:  Without edema. Neurologic:  Alert and oriented,  grossly normal neurologically. Psych:  Cooperative. Normal mood and affect.  Intake/Output from previous day: 03/23 0701 - 03/24 0700 In: 760 [P.O.:360; IV Piggyback:400] Out: 2905 [Urine:2905] Intake/Output this shift: Total I/O In: -  Out: 250 [Urine:250]  Lab Results:  Recent Labs  09/23/15 0506 09/24/15 0522  WBC 9.5 7.0  HGB 15.6 15.0  HCT 44.8 43.0  PLT 233 255   BMET  Recent Labs  09/23/15 0506 09/24/15 0522  NA 133* 135  K 3.4* 4.2  CL 97* 100*  CO2 26 28  GLUCOSE 129* 81  BUN 16 12  CREATININE 0.70 0.66  CALCIUM 8.7* 8.1*   LFT  Recent Labs  09/23/15 0506  PROT 6.6  ALBUMIN 3.3*  AST 34  ALT 48  ALKPHOS 52  BILITOT 1.4*   PT/INR No results for input(s): LABPROT, INR in the last 72 hours.      Assessment / Plan:     #1 60 yo Hispanic male with acute severe diffuse abdominal pain - concerning for ischemic event- he has occluded celiac, and patent IMA/SMA. 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  Pain resolved with 24 hours and has not recurred #2 Diffuse gastritis - bx shows Hpylori #3 COPD #4 CAP  Plan: Pt  stable for discharge from GI standpoint Avoid ASA/ NSAIDS Treat Hpylori gastritis with Prevpac one po BID x 14 days, then prevacid or prilosec once daily x one month Pt aware to return to ED if severe pain recurs    Principal Problem:   Gastritis Active Problems:   COPD (chronic obstructive pulmonary disease) ([place])   CAP (community acquired pneumonia)   Diabetes mellitus ([place])   Hypokalemia   Abdominal pain    LOS: 1 day   Amy Esterwood  09/25/2015, 10:15 AM      Attending physician's note   I have taken an interval history, reviewed the chart and examined the patient. I agree with the Advanced Practitioner's note, impression and recommendations. Treat for H pylori gastritis as outlined above. Discussed with Dr. Eliseo Squires who will contact IR for further input on celiac occlusion and if they have no further recommendations would be ok for discharge from GI standpoint.    Lucio Edward, MD [facility] [phone] Mon-Fri 8a-5p [phone] after 5p, weekends, holidays

## 2015-09-25 NOTE — Hospital Discharge Follow-Up (Signed)
Message received from Dessa Phi, RN CM requesting a hospital follow up appointment for the patient. An appointment was scheduled for 09/30/15 @ 1700 @ [place] and the information was placed on the AVS. The appointment at [facility] for today was cancelled due to the patient being hospitalized.   Update provided to K. Mahabir, RN CM

## 2015-09-25 NOTE — Discharge Summary (Signed)
Physician Discharge Summary  Gedalya Jim HBZ:[id number] DOB: 1956/03/15 DOA: 09/23/2015  PCP: No primary care provider on file.  Admit date: 09/23/2015 Discharge date: 09/25/2015  Time spent: 35 minutes  Recommendations for Outpatient Follow-up:  1. Finish treatment of h pylori and then start prevacid for 1 month   Discharge Diagnoses:  Principal Problem:   Gastritis Active Problems:   COPD (chronic obstructive pulmonary disease) ([place])   CAP (community acquired pneumonia)   Diabetes mellitus ([place])   Hypokalemia   Abdominal pain   Discharge Condition: improved  Diet recommendation: diabetic  Filed Weights   09/23/15 0409 09/23/15 1151  Weight: 65.318 kg (144 lb) 66.4 kg (146 lb 6.2 oz)    History of present illness:  Duane Cuevas is an 60 y.o. male who is not under the care of her regular physician and who was recently hospitalized 09/19/15-09/21/15 for treatment of CAP. He was discharged on several new medications including: Levaquin, metformin, and prednisone. He subsequently developed the acute onset of lower abdominal pain around 1 AM this morning, described as "burning", associated with N/V and 7 episodes of emesis. Thinks the emesis was dark on occasion. No associated diarrhea. No fever but has had chills. The ED physician reports that it took several doses of Dilaudid to get his pain under control, and the pain seemed to be out of proportion to radiographic imaging and physical exam findings. Patient appears to be comfortable at present, although he still endorses severe abdominal pain that has lessened since he has been given medications for pain. Because of the severity of his pain, CT angiogram of the abdomen and pelvis was done to rule out ischemia, and he was found to have thickening of stomach consistent with gastritis. Patient does endorse that he drinks alcohol regularly, up to a sixpack daily, but has not had anything to drink in the last 2 weeks. Lipase is  not elevated.  Hospital Course:  Gastritis/h pylori - May be from prednisone and history of daily alcohol use. - s/p EGD with biopsy- h pylori -pain free  occlusion of celiac axis -seen by vascular: No evidence of atherosclerosis with widely patent SMA and IMA and good collateral flow to splenic and hepatic arteries from SMA. 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   CAP (community acquired pneumonia) - treated   Diabetes mellitus ([place]) - resume home meds   Procedures:  EGD  Consultations:  GI  vascular  Discharge Exam: Filed Vitals:   09/24/15 2145 09/25/15 0615  BP: 114/91 121/77  Pulse: 62 89  Temp: 97.9 F (36.6 C) 98.4 F (36.9 C)  Resp: 18 18    General: awake, eating well, feeling better   Discharge Instructions    Current Discharge Medication List    START taking these medications   Details  amoxicillin-clarithromycin-lansoprazole (PREVPAC) combo pack Take by mouth 2 (two) times daily. Follow package directions. Qty: 1 kit, Refills: 0    fluconazole (DIFLUCAN) 100 MG tablet Take 1 tablet (100 mg total) by mouth daily. Qty: 6 tablet, Refills: 0    lansoprazole (PREVACID) 30 MG capsule Take 1 capsule (30 mg total) by mouth daily at 12  noon. Qty: 30 capsule, Refills: 0      CONTINUE these medications which have NOT CHANGED   Details  albuterol (PROVENTIL) (2.5 MG/3ML) 0.083% nebulizer solution Take 6 mLs (5 mg total) by nebulization every 6 (six) hours as needed for wheezing or shortness of breath. Qty: 75 mL, Refills: 0    metFORMIN (GLUCOPHAGE) 500 MG tablet Take 1  tablet (500 mg total) by mouth 2 (two) times daily with a meal. Qty: 60 tablet, Refills: 2      STOP taking these medications     levofloxacin (LEVAQUIN) 750 MG tablet      predniSONE (DELTASONE) 10 MG tablet        No Known Allergies Follow-up Information    Follow up with [facility] On 09/25/2015.   Why:  Appointment on 09/25/15 at 5:00 pm with Freeman Caldron, PA   Contact information:   [address] [place] [address] [phone]       The results of significant diagnostics from this hospitalization (including imaging, microbiology, ancillary and laboratory) are listed below for reference.    Significant Diagnostic Studies: Dg Chest 2 View  09/23/2015  CLINICAL DATA:  60 year old male with abdominal pain and left-sided chest pain EXAM: CHEST  2 VIEW COMPARISON:  Radiograph dated 09/19/2015 a FINDINGS: Two views of the chest demonstrate emphysematous changes of the lungs. There is no focal consolidation, pleural effusion, or pneumothorax. The cardiac silhouette is within normal limits. There is mild diffuse increased interstitial prominence which may be related to underlying COPD changes or represent mild interstitial edema. No acute osseous pathology identified. Left rotator cuff surgical pinning noted. IMPRESSION: Emphysema.  No focal consolidation or pneumothorax. Electronically Signed   By: Anner Crete M.D.   On: 09/23/2015 05:06   Dg Chest 2 View  09/19/2015  CLINICAL DATA:  Acute onset of cough, difficulty breathing, fever, chills and vomiting. Decreased O2 saturation. Initial encounter. EXAM: CHEST  2 VIEW COMPARISON:  Chest radiograph performed 03/03/2015 FINDINGS: The lungs are hyperexpanded, with flattening of the hemidiaphragms. Vascular congestion is noted, with mild peribronchial thickening. Mild right basilar opacity may reflect mild pneumonia. There is no evidence of pleural effusion or pneumothorax. The heart  is normal in size; the mediastinal contour is within normal limits. No acute osseous abnormalities are seen. IMPRESSION: Findings of COPD, with mild underlying vascular congestion. Mild right basilar opacity may reflect mild pneumonia. Electronically Signed   By: Garald Balding M.D.   On: 09/19/2015 21:58   US Scrotum  09/23/2015  CLINICAL DATA:  60 year old with left-greater-than-right scrotal pain. EXAM: SCROTAL ULTRASOUND DOPPLER ULTRASOUND OF THE TESTICLES TECHNIQUE: Complete ultrasound examination of the testicles, epididymis, and other scrotal structures was performed. Color and spectral Doppler ultrasound were also utilized to evaluate blood flow to the testicles. FINDINGS: Right testicle Measurements: 4.9 x 2.5 x 3.2 cm. No mass or microlithiasis visualized. Normal blood flow with color Doppler. Left testicle Measurements: 3.2 x 2.1 x 3.5 cm. No mass or microlithiasis visualized. Normal blood flow with color Doppler. Right epididymis:  Normal in size and appearance. Left epididymis:  Normal in size and appearance. Hydrocele:  None visualized. Varicocele:  None visualized. Pulsed Doppler interrogation of both testes demonstrates normal low resistance arterial and venous waveforms bilaterally. IMPRESSION: Normal scrotal ultrasound. Both testes appear normal with normal blood flow. No evidence of testicular torsion. Electronically Signed   By: Richardean Sale M.D.   On: 09/23/2015 09:08   [place] Art/ven Flow  Abd Pelv Doppler  09/23/2015  CLINICAL DATA:  60 year old with left-greater-than-right scrotal pain. EXAM: SCROTAL ULTRASOUND DOPPLER ULTRASOUND OF THE TESTICLES TECHNIQUE: Complete ultrasound examination of the testicles, epididymis, and other scrotal structures was performed. Color and spectral Doppler ultrasound were also utilized to evaluate blood flow to the testicles. FINDINGS: Right testicle Measurements: 4.9 x 2.5 x 3.2 cm. No mass or microlithiasis visualized. Normal blood flow with color  Doppler. Left testicle Measurements: 3.2 x 2.1 x 3.5 cm. No mass or microlithiasis visualized. Normal blood flow with color Doppler. Right epididymis:  Normal in size and appearance. Left epididymis:  Normal in size and appearance. Hydrocele:  None visualized. Varicocele:  None visualized. Pulsed Doppler interrogation of both testes demonstrates normal low resistance arterial and venous waveforms bilaterally. IMPRESSION: Normal scrotal ultrasound. Both testes appear normal with normal blood flow. No evidence of testicular torsion. Electronically Signed   By: Richardean Sale M.D.   On: 09/23/2015 09:08   Ct Angio Abd/pel W/ And/or W/o  09/23/2015  CLINICAL DATA:  60 year old male with a history of abdominal pain for several hours. EXAM: CT ABDOMEN AND PELVIS WITH CONTRAST TECHNIQUE: Multidetector CT imaging of the abdomen and pelvis was performed using the standard protocol following bolus administration of intravenous contrast. Re-formatted images in coronal and sagittal plane performed on a separate workstation with both arterial phase and delayed phase. CONTRAST:  175m OMNIPAQUE IOHEXOL 350 MG/ML SOLN COMPARISON:  04/13/2012 FINDINGS: Lower chest: Unremarkable appearance of the soft tissues of the chest wall. Heart size within normal limits.  No pericardial fluid/thickening. No lower mediastinal adenopathy. Unremarkable appearance of the distal esophagus. No hiatal hernia. Extensive changes of paraseptal and centrilobular emphysema. Motion artifact somewhat limits evaluation. Scarring/atelectasis of the lung bases. Nodular opacity at the right posterior base. Abdomen/pelvis: Unremarkable appearance of liver. Unremarkable appearance of spleen. Unremarkable gallbladder. Unremarkable appearance of pancreas. Unremarkable bilateral adrenal glands. No free fluid or free air within the mesenteric. Unremarkable appearance of the bilateral kidneys. Unremarkable course of the ureters. Small anterior right diverticulum  of the urinary bladder which is relatively decompressed. Compared to the prior CT there is interval development of circumferential and diffuse gastric wall thickening involving cardia, fundus, antral region. No significant inflammatory changes of the associated mesenteric. No evidence of perforation. No abnormally distended small bowel or colon. 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 Small fact containing left inguinal hernia. Musculoskeletal: Mild changes of degenerative disc disease.  No bony canal narrowing. Vascular: Minimal atherosclerotic changes of the visualized vasculature. No aneurysm. No dissection flap. No periaortic fluid. Bilateral iliac and iliofemoral system patent with no occlusion or narrowing. 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 Celiac artery contributes to splenic artery, hepatic artery, left gastric artery. Enlargement of the superior pancreaticoduodenal arteries, which demonstrate significant anastomosis/flow from the superior mesenteric artery. Superior mesenteric artery widely patent with no stenosis. Inferior mesenteric artery is patent. Renal arteries are patent. IMPRESSION: Circumferential and diffuse gastric wall thickening, new from the CT of 2013. This is most compatible with nonspecific gastritis, however, infiltrative process such as lymphoma could also have this appearance. Recommend correlation with upper endoscopy. 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 Both the superior mesenteric artery and inferior mesenteric artery contribute to collateralized flow. Although this pattern can be seen with chronic mesenteric ischemia, this is not favored as the etiology of the gastric wall thickening, as the collateral vessels fill normally with no occlusion  identified. Centrilobular and paraseptal emphysema. Nodular density at the base the  right lung is favored to be inflammatory, however, a follow-up chest CT in 3-6 months is recommended. Left inguinal hernia. 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 These results were called by telephone at the time of interpretation on 09/23/2015 at 7:42 am to Dr. Varney Biles , who verbally acknowledged these results. Signed, Dulcy Fanny. Earleen Newport, DO [facility] Radiology Electronically Signed   By: Corrie Mckusick D.O.   On: 09/23/2015 07:42    Microbiology: No results found for this or any previous visit (from the past 240 hour(s)).   Labs: Basic Metabolic Panel:  Recent Labs Lab 09/19/15 2149 09/20/15 0509 09/21/15 0353 09/23/15 0506 09/24/15 0522  NA 130* 132* 135 133* 135  K 3.7 3.9 3.6 3.4* 4.2  CL 92* 101 104 97* 100*  CO2 _0 GLUCOSE 179* 264* 200* 129* 81  BUN _1 CREATININE 0.91 0.75 0.71 0.70 0.66  CALCIUM 8.5* 8.4* 8.3* 8.7* 8.1*   Liver Function Tests:  Recent Labs Lab 09/19/15 2149 09/23/15 0506  AST 32 34  ALT 25 48  ALKPHOS 53 52  BILITOT 0.4 1.4*  PROT 7.3 6.6  ALBUMIN 3.7 3.3*    Recent Labs Lab 09/19/15 2149 09/23/15 0506  LIPASE 29 26   No results for input(s): AMMONIA in the last 168 hours. CBC:  Recent Labs Lab 09/19/15 2149 09/20/15 0509 09/21/15 0353 09/23/15 0506 09/24/15 0522  WBC 7.3 11.3* 14.5* 9.5 7.0  NEUTROABS  --   --   --  6.9  --   HGB 16.8 16.4 14.4 15.6 15.0  HCT 46.1 44.7 42.2 44.8 43.0  MCV 89.7 90.1 93.4 92.0 90.5  PLT 135* 122* 127* 233 255   Cardiac Enzymes: No results for input(s): CKTOTAL, CKMB, CKMBINDEX, TROPONINI in the last 168 hours. BNP: BNP (last 3 results) No results for input(s): BNP in the last 8760 hours.  ProBNP (last 3 results) No results for input(s): PROBNP in the last 8760 hours.  CBG:  Recent Labs Lab 09/24/15 1707 09/24/15 2145 09/25/15 0718 09/25/15 1156  GLUCAP 110* 92 104* 145*        Signed:  Joaquin Knebel U Wilburt Messina DO.  [facility] 09/25/2015, 12:26 PM

## 2015-09-30 ENCOUNTER — Inpatient Hospital Stay: Payer: Self-pay | Admitting: Internal Medicine

## 2017-11-30 ENCOUNTER — Emergency Department (HOSPITAL_COMMUNITY)
Admission: EM | Admit: 2017-11-30 | Discharge: 2017-11-30 | Disposition: A | Discharge status: Home / Self Care | Payer: Self-pay | Attending: Emergency Medicine | Admitting: Emergency Medicine

## 2017-11-30 ENCOUNTER — Emergency Department (HOSPITAL_COMMUNITY): Payer: Self-pay

## 2017-11-30 ENCOUNTER — Encounter (HOSPITAL_COMMUNITY): Payer: Self-pay | Admitting: *Deleted

## 2017-11-30 DIAGNOSIS — J449 Chronic obstructive pulmonary disease, unspecified: Secondary | ICD-10-CM | POA: Insufficient documentation

## 2017-11-30 DIAGNOSIS — E119 Type 2 diabetes mellitus without complications: Secondary | ICD-10-CM | POA: Insufficient documentation

## 2017-11-30 DIAGNOSIS — R062 Wheezing: Secondary | ICD-10-CM | POA: Insufficient documentation

## 2017-11-30 DIAGNOSIS — J441 Chronic obstructive pulmonary disease with (acute) exacerbation: Secondary | ICD-10-CM

## 2017-11-30 DIAGNOSIS — F17210 Nicotine dependence, cigarettes, uncomplicated: Secondary | ICD-10-CM | POA: Insufficient documentation

## 2017-11-30 DIAGNOSIS — R0789 Other chest pain: Secondary | ICD-10-CM | POA: Insufficient documentation

## 2017-11-30 LAB — COMPREHENSIVE METABOLIC PANEL
ALK PHOS: 68 U/L (ref 38–126)
ALT: 20 U/L (ref 17–63)
ANION GAP: 11 (ref 5–15)
AST: 23 U/L (ref 15–41)
Albumin: 4 g/dL (ref 3.5–5.0)
BUN: 14 mg/dL (ref 6–20)
CALCIUM: 9.1 mg/dL (ref 8.9–10.3)
CHLORIDE: 95 mmol/L — AB (ref 101–111)
CO2: 24 mmol/L (ref 22–32)
Creatinine, Ser: 0.87 mg/dL (ref 0.61–1.24)
GFR calc non Af Amer: >60 mL/min (ref >60)
Glucose, Bld: 101 mg/dL — ABNORMAL HIGH (ref 65–99)
POTASSIUM: 3.7 mmol/L (ref 3.5–5.1)
SODIUM: 130 mmol/L — AB (ref 135–145)
Total Bilirubin: 0.9 mg/dL (ref 0.3–1.2)
Total Protein: 7.3 g/dL (ref 6.5–8.1)

## 2017-11-30 LAB — CBC WITH DIFFERENTIAL/PLATELET
Basophils Absolute: 0 10*3/uL (ref 0.0–0.1)
Basophils Relative: 0 %
EOS ABS: 0.3 10*3/uL (ref 0.0–0.7)
EOS PCT: 2 %
HCT: 44 % (ref 39.0–52.0)
Hemoglobin: 15.5 g/dL (ref 13.0–17.0)
Lymphocytes Relative: 17 %
Lymphs Abs: 1.8 10*3/uL (ref 0.7–4.0)
MCH: 33.3 pg (ref 26.0–34.0)
MCHC: 35.2 g/dL (ref 30.0–36.0)
MCV: 94.6 fL (ref 78.0–100.0)
MONO ABS: 0.7 10*3/uL (ref 0.1–1.0)
MONOS PCT: 7 %
Neutro Abs: 7.9 10*3/uL — ABNORMAL HIGH (ref 1.7–7.7)
Neutrophils Relative %: 74 %
PLATELETS: 234 10*3/uL (ref 150–400)
RBC: 4.65 MIL/uL (ref 4.22–5.81)
RDW: 12.6 % (ref 11.5–15.5)
WBC: 10.7 10*3/uL — ABNORMAL HIGH (ref 4.0–10.5)

## 2017-11-30 LAB — TROPONIN I: Troponin I: <0.03 ng/mL (ref <0.03)

## 2017-11-30 MED ORDER — ALBUTEROL SULFATE (2.5 MG/3ML) 0.083% IN NEBU
2.5000 mg | INHALATION_SOLUTION | Freq: Once | RESPIRATORY_TRACT | Status: AC
  Administered 2017-11-30: 2.5 mg via RESPIRATORY_TRACT
  Filled 2017-11-30: qty 3

## 2017-11-30 MED ORDER — ALBUTEROL SULFATE (2.5 MG/3ML) 0.083% IN NEBU
5.0000 mg | INHALATION_SOLUTION | Freq: Once | RESPIRATORY_TRACT | Status: AC
  Administered 2017-11-30: 5 mg via RESPIRATORY_TRACT
  Filled 2017-11-30: qty 6

## 2017-11-30 MED ORDER — GUAIFENESIN-DM 100-10 MG/5ML PO SYRP: 5.0000 mL | ORAL_SOLUTION | Freq: Three times a day (TID) | ORAL | 0 refills | Status: DC | PRN

## 2017-11-30 MED ORDER — GUAIFENESIN-DM 100-10 MG/5ML PO SYRP
5.0000 mL | ORAL_SOLUTION | Freq: Once | ORAL | Status: AC
  Administered 2017-11-30: 5 mL via ORAL
  Filled 2017-11-30: qty 10

## 2017-11-30 MED ORDER — METHYLPREDNISOLONE SODIUM SUCC 125 MG IJ SOLR
125.0000 mg | Freq: Once | INTRAMUSCULAR | Status: AC
  Administered 2017-11-30: 125 mg via INTRAVENOUS
  Filled 2017-11-30: qty 2

## 2017-11-30 MED ORDER — PREDNISONE 20 MG PO TABS: 40.0000 mg | ORAL_TABLET | Freq: Every day | ORAL | 0 refills | Status: DC

## 2017-11-30 MED ORDER — IPRATROPIUM BROMIDE 0.02 % IN SOLN
0.5000 mg | Freq: Once | RESPIRATORY_TRACT | Status: AC
  Administered 2017-11-30: 0.5 mg via RESPIRATORY_TRACT
  Filled 2017-11-30: qty 2.5

## 2017-11-30 NOTE — ED Triage Notes (Signed)
Pt complains of chest pain and shortness of breath. Pt has hx of emphysema, states he has been working out in the heat and has been having trouble breathing over the past week. Pt has been having to use inhaler every 2 hours.

## 2017-11-30 NOTE — Discharge Instructions (Signed)
Follow-up with a primary care doctor for further evaluation and treatment

## 2017-11-30 NOTE — ED Provider Notes (Signed)
[facility] DEPT Provider Note   CSN: [id number] Arrival date & time: 11/30/17  1854     History   Chief Complaint Chief Complaint  Patient presents with  . Shortness of Breath  . Chest Pain    HPI Duane Cuevas is a 62 y.o. male.  HPI Patient with shortness of breath and wheezing.  Over the last week has had shortness of breath uses breathing treatments but today much worse.  Has been outside.  Thinks that he may have helped trigger it.  History of emphysema.  Has some chest tightness.  No fevers.  Has had a cough with minimal production.  No abdominal pain.  No swelling in his legs. Past Medical History:  Diagnosis Date  . Diabetes mellitus ([place]) 10/2014  . Emphysema/COPD ([place]) 10/2014  . Hepatic steatosis   . Thrombocytopenia ([place]) 09/2015   platelets in 120s.     Patient Active Problem List   Diagnosis Date Noted  . Abdominal pain   . Gastritis 09/23/2015  . Diabetes mellitus ([place]) 09/23/2015  . Hypokalemia 09/23/2015  . CAP (community acquired pneumonia) 09/19/2015  . COPD (chronic obstructive pulmonary disease) ([place]) 10/14/2014    Past Surgical History:  Procedure Laterality Date  . ESOPHAGOGASTRODUODENOSCOPY N/A 09/23/2015   Procedure: ESOPHAGOGASTRODUODENOSCOPY (EGD);  Surgeon: Ladene Artist, MD;  Location: [facility];  Service: Endoscopy;  Laterality: N/A;  . FOOT SURGERY          Home Medications    Prior to Admission medications   Medication Sig Start Date End Date Taking? Authorizing Provider  albuterol (PROVENTIL) (2.5 MG/3ML) 0.083% nebulizer solution Take 6 mLs (5 mg total) by nebulization every 6 (six) hours as needed for wheezing or shortness of breath. 03/03/15   Etta Quill, NP  amoxicillin-clarithromycin-lansoprazole [facility]) combo pack Take by mouth 2 (two) times daily. Follow package directions. Patient not taking: Reported on 11/30/2017 09/25/15   Geradine Girt, DO  fluconazole (DIFLUCAN) 100 MG tablet  Take 1 tablet (100 mg total) by mouth daily. Patient not taking: Reported on 11/30/2017 09/25/15   Geradine Girt, DO  guaiFENesin-dextromethorphan (ROBITUSSIN DM) 100-10 MG/5ML syrup Take 5 mLs by mouth 3 (three) times daily as needed for cough. 11/30/17   Davonna Belling, MD  lansoprazole (PREVACID) 30 MG capsule Take 1 capsule (30 mg total) by mouth daily at 12 noon. Patient not taking: Reported on 11/30/2017 09/25/15   Geradine Girt, DO  metFORMIN (GLUCOPHAGE) 500 MG tablet Take 1 tablet (500 mg total) by mouth 2 (two) times daily with a meal. Patient not taking: Reported on 11/30/2017 09/21/15   Elgergawy, Silver Huguenin, MD  predniSONE (DELTASONE) 20 MG tablet Take 2 tablets (40 mg total) by mouth daily. 11/30/17   Davonna Belling, MD    Family History Family History  Problem Relation Age of Onset  . Emphysema Mother   . Diabetes Mother   . Emphysema Father   . Cancer Sister        Stomach cancer    Social History Social History   Tobacco Use  . Smoking status: Current Every Day Smoker    Packs/day: 1.00    Types: Cigarettes  . Smokeless tobacco: Never Used  Substance Use Topics  . Alcohol use: Yes    Alcohol/week: 3.6 oz    Types: 6 Cans of beer per week  . Drug use: No     Allergies   Patient has no known allergies.   Review of Systems Review of Systems  Constitutional: Negative for appetite change.  HENT: Negative for congestion.   Respiratory: Positive for cough, chest tightness, shortness of breath and wheezing.   Cardiovascular: Positive for chest pain.  Gastrointestinal: Negative for abdominal distention.  Genitourinary: Negative for flank pain.  Musculoskeletal: Negative for back pain.  Skin: Negative for rash.  Neurological: Negative for numbness.  Psychiatric/Behavioral: Negative for behavioral problems.     Physical Exam Updated Vital Signs BP 132/85   Pulse 78   Temp 97.6 F (36.4 C) (Oral)   Resp 12   SpO2 100%   Physical Exam    Constitutional: He appears well-developed.  HENT:  Head: Normocephalic.  Eyes: EOM are normal.  Neck: Neck supple.  Cardiovascular:  Tachycardia  Pulmonary/Chest:  Initial with some respiratory distress and prolonged expirations with wheezes.  Abdominal: There is no tenderness.  Musculoskeletal:       Right lower leg: He exhibits no edema.       Left lower leg: He exhibits no edema.  Neurological: He is alert.  Skin: Skin is warm. Capillary refill takes less than 2 seconds.     ED Treatments / Results  Labs (all labs ordered are listed, but only abnormal results are displayed) Labs Reviewed  CBC WITH DIFFERENTIAL/PLATELET - Abnormal; Notable for the following components:      Result Value   WBC 10.7 (*)    Neutro Abs 7.9 (*)    All other components within normal limits  COMPREHENSIVE METABOLIC PANEL - Abnormal; Notable for the following components:   Sodium 130 (*)    Chloride 95 (*)    Glucose, Bld 101 (*)    All other components within normal limits  TROPONIN I    EKG EKG Interpretation  Date/Time:  Thursday Nov 30 2017 19:44:26 EDT Ventricular Rate:  91 PR Interval:    QRS Duration: 91 QT Interval:  379 QTC Calculation: 467 R Axis:   77 Text Interpretation:  Sinus rhythm LAE, consider biatrial enlargement RSR' in V1 or V2, right VCD or RVH Minimal ST elevation, anterior leads Confirmed by Davonna Belling [phone]) on 11/30/2017 10:52:01 PM   Radiology Dg Chest Portable 1 View  Result Date: 11/30/2017 CLINICAL DATA:  Pt complains of chest pain and shortness of breath. Pt has hx of emphysema, states he has been working out in the heat and has been having trouble breathing over the past week. Pt has been having to use inhaler every 2 hours. Smoker. EXAM: PORTABLE CHEST 1 VIEW COMPARISON:  09/23/2015 FINDINGS: The cardiac silhouette is normal in size. No mediastinal or hilar masses. Prominent bronchovascular markings, stable. No evidence of pneumonia or pulmonary  edema. No pleural effusion or pneumothorax. Skeletal structures are grossly intact. IMPRESSION: No acute cardiopulmonary disease. Electronically Signed   By: Lajean Manes M.D.   On: 11/30/2017 19:23    Procedures Procedures (including critical care time)  Medications Ordered in ED Medications  albuterol (PROVENTIL) (2.5 MG/3ML) 0.083% nebulizer solution 5 mg (5 mg Nebulization Given 11/30/17 1912)  ipratropium (ATROVENT) nebulizer solution 0.5 mg (0.5 mg Nebulization Given 11/30/17 1913)  methylPREDNISolone sodium succinate (SOLU-MEDROL) 125 mg/2 mL injection 125 mg (125 mg Intravenous Given 11/30/17 1912)  guaiFENesin-dextromethorphan (ROBITUSSIN DM) 100-10 MG/5ML syrup 5 mL (5 mLs Oral Given 11/30/17 2219)  albuterol (PROVENTIL) (2.5 MG/3ML) 0.083% nebulizer solution 2.5 mg (2.5 mg Nebulization Given 11/30/17 2227)     Initial Impression / Assessment and Plan / ED Course  I have reviewed the triage vital signs and the nursing notes.  Pertinent labs & imaging results that were available during my care of the patient were reviewed by me and considered in my medical decision making (see chart for details).    Patient with shortness of breath.  Likely COPD exacerbation but may have some extra symptoms due to the heat outside.  Feels better after treatment but did have initial dyspnea.  X-ray reassuring.  Will discharge home with steroids and symptomatic treatment.   Final Clinical Impressions(s) / ED Diagnoses   Final diagnoses:  COPD exacerbation [facility])    ED Discharge Orders        Ordered    predniSONE (DELTASONE) 20 MG tablet  Daily     11/30/17 2257    guaiFENesin-dextromethorphan (ROBITUSSIN DM) 100-10 MG/5ML syrup  3 times daily PRN     11/30/17 2257       Davonna Belling, MD 11/30/17 2257

## 2018-03-16 ENCOUNTER — Encounter: Payer: Self-pay | Admitting: Emergency Medicine

## 2018-03-16 ENCOUNTER — Other Ambulatory Visit: Payer: Self-pay

## 2018-03-16 ENCOUNTER — Ambulatory Visit: Payer: Self-pay | Admitting: Emergency Medicine

## 2018-03-16 VITALS — BP 128/73 | HR 84 | Temp 98.1°F | Resp 16 | Ht 67.25 in | Wt 145.0 lb

## 2018-03-16 DIAGNOSIS — N529 Male erectile dysfunction, unspecified: Secondary | ICD-10-CM

## 2018-03-16 DIAGNOSIS — K4091 Unilateral inguinal hernia, without obstruction or gangrene, recurrent: Secondary | ICD-10-CM

## 2018-03-16 DIAGNOSIS — J449 Chronic obstructive pulmonary disease, unspecified: Secondary | ICD-10-CM

## 2018-03-16 DIAGNOSIS — Z1211 Encounter for screening for malignant neoplasm of colon: Secondary | ICD-10-CM

## 2018-03-16 DIAGNOSIS — Z8639 Personal history of other endocrine, nutritional and metabolic disease: Secondary | ICD-10-CM

## 2018-03-16 MED ORDER — BUDESONIDE-FORMOTEROL FUMARATE 80-4.5 MCG/ACT IN AERO: 2.0000 | INHALATION_SPRAY | Freq: Two times a day (BID) | RESPIRATORY_TRACT | 3 refills | Status: DC

## 2018-03-16 MED ORDER — SILDENAFIL CITRATE 100 MG PO TABS: 50.0000 mg | ORAL_TABLET | Freq: Every day | ORAL | 11 refills | Status: DC | PRN

## 2018-03-16 NOTE — Patient Instructions (Addendum)
   If you have lab work done today you will be contacted with your lab results within the next 2 weeks.  If you have not heard from us then please contact us. The fastest way to get your results is to register for My Chart.   IF you received an x-ray today, you will receive an invoice from [facility]. Please contact [facility] at [phone] with questions or concerns regarding your invoice.   IF you received labwork today, you will receive an invoice from [facility]. Please contact [facility] at [phone] with questions or concerns regarding your invoice.   Our billing staff will not be able to assist you with questions regarding bills from these companies.  You will be contacted with the lab results as soon as they are available. The fastest way to get your results is to activate your My Chart account. Instructions are located on the last page of this paperwork. If you have not heard from us regarding the results in 2 weeks, please contact this office.     Diabetes mellitus y nutricin Diabetes Mellitus and Nutrition Si sufre de diabetes (diabetes mellitus), es muy importante tener hbitos alimenticios saludables debido a que sus niveles de azcar en la sangre (glucosa) se ven afectados en gran medida por lo que come y bebe. [place] alimentos saludables en las cantidades adecuadas, aproximadamente a la misma hora todos los das, lo ayudar a:  Controlar la glucemia.  Disminuir el riesgo de sufrir una enfermedad cardaca.  Mejorar la presin arterial.  Alcanzar o mantener un peso saludable.  Todas las personas que sufren de diabetes son diferentes y cada una tiene necesidades diferentes en cuanto a un plan de alimentacin. El mdico puede recomendarle que trabaje con un especialista en dietas y nutricin (nutricionista) para elaborar el mejor plan para usted. Su plan de alimentacin puede variar segn factores como:  Las caloras que necesita.  Los medicamentos  que toma.  Su peso.  Sus niveles de glucemia, presin arterial y colesterol.  Su nivel de actividad.  Otras afecciones que tenga, como enfermedades cardacas o renales.  Cmo me afectan los carbohidratos? Los carbohidratos afectan el nivel de glucemia ms que cualquier otro tipo de alimento. La ingesta de carbohidratos naturalmente aumenta la cantidad glucosa en la sangre. El recuento de carbohidratos es un mtodo destinado a llevar un registro de la cantidad de carbohidratos que se ingieren. El recuento de carbohidratos es importante para mantener la glucemia a un nivel saludable, en especial si utiliza insulina o toma determinados medicamentos por va oral para la diabetes. Es importante saber la cantidad de carbohidratos que se pueden ingerir en cada comida sin correr ningn riesgo. Esto es diferente en cada persona. El nutricionista puede ayudarlo a calcular la cantidad de carbohidratos que debe ingerir en cada comida y colacin. Los alimentos que contienen carbohidratos incluyen:  Pan, cereal, arroz, pasta y galletas.  Papas y maz.  Guisantes, frijoles y lentejas.  Leche y yogur.  Frutas y jugo.  Postres, como pasteles, galletitas, helado y caramelos.  Cmo me afecta el alcohol? El alcohol puede provocar disminuciones sbitas de la glucemia (hipoglucemia), en especial si utiliza insulina o toma determinados medicamentos por va oral para la diabetes. La hipoglucemia es una afeccin potencialmente mortal. Los sntomas de la hipoglucemia (somnolencia, mareos y confusin) son similares a los sntomas de haber consumido demasiado alcohol. Si el mdico afirma que el alcohol es seguro para usted, siga estas pautas:  Limite el consumo de alcohol a no   ms de 1 medida por da si es mujer y no est embarazada, y a 2 medidas si es hombre. Una medida equivale a 12oz (355ml) de cerveza, 5oz (148ml) de vino o 1oz (44ml) de bebidas de alta graduacin alcohlica.  No beba con el  estmago vaco.  Mantngase hidratado con agua, gaseosas dietticas o t helado sin azcar.  Tenga en cuenta que las gaseosas comunes, los jugos y otros refrescos pueden contener mucha azcar y se deben contar como carbohidratos.  Consejos para seguir este plan Leer las etiquetas de los alimentos  Comience por controlar el tamao de la porcin en la etiqueta. La cantidad de caloras, carbohidratos, grasas y otros nutrientes mencionados en la etiqueta se basan en una porcin del alimento. Muchos alimentos contienen ms de una porcin por envase.  Verifique la cantidad total de gramos (g) de carbohidratos totales en una porcin. Puede calcular la cantidad de porciones de carbohidratos al dividir el total de carbohidratos por 15. Por ejemplo, si un alimento posee un total de 30g de carbohidratos, equivale a 2 porciones de carbohidratos.  Verifique la cantidad de gramos (g) de grasas saturadas y grasas trans en una porcin. Escoja alimentos que no contengan grasa o que tengan un bajo contenido.  Controle la cantidad de miligramos (mg) de sodio en una porcin. La mayora de las personas deben limitar la ingesta de sodio total a menos de 2300mg por da.  Siempre consulte la informacin nutricional de los alimentos etiquetados como "con bajo contenido de grasa" o "sin grasa". Estos alimentos pueden ser ms altos en azcar agregada o en carbohidratos refinados y deben evitarse.  Hable con el nutricionista para identificar sus objetivos diarios en cuanto a los nutrientes mencionados en la etiqueta. De compras  Evite comprar alimentos procesados, enlatados o prehechos. Estos alimentos tienden a tener mayor cantidad de grasa, sodio y azcar agregada.  Compre en la zona exterior de la tienda de comestibles. Esta incluye frutas y vegetales frescos, granos a granel, carnes frescas y productos lcteos frescos. Coccin  Utilice mtodos de coccin a baja temperatura, como hornear, en lugar de mtodos de  coccin a alta temperatura, como frer en abundante aceite.  Cocine con aceites saludables, como el aceite de oliva, canola o girasol.  Evite cocinar con manteca, crema o carnes con alto contenido de grasa. Planificacin de las comidas  Consuma las comidas y las colaciones de forma regular, preferentemente a la misma hora todos los das. Evite pasar largos perodos de tiempo sin comer.  Consuma alimentos ricos en fibra, como frutas frescas, verduras, frijoles y cereales integrales. Consulte al nutricionista sobre cuntas porciones de carbohidratos puede consumir en cada comida.  Consuma entre 4 y 6 onzas de protenas magras por da, como carnes magras, pollo, pescado, huevos o tofu. 1 onza equivale a 1 onza de carne, pollo o pescado, 1 huevo, o 1/4 taza de tofu.  Coma algunos alimentos por da que contengan grasas saludables, como aguacates, frutos secos, semillas y pescado. Estilo de vida   Controle su nivel de glucemia con regularidad.  Haga ejercicio al menos 30minutos, 5das o ms por semana, o como se lo haya indicado el mdico.  Tome los medicamentos como se lo haya indicado el mdico.  No consuma ningn producto que contenga nicotina o tabaco, como cigarrillos y cigarrillos electrnicos. Si necesita ayuda para dejar de fumar, consulte al mdico.  Trabaje con un asesor o instructor en diabetes para identificar estrategias para controlar el estrs y cualquier desafo emocional y social. Cules   son algunas de las preguntas que puedo hacerle a mi mdico?  Es necesario que me rena con Radio broadcast assistant en diabetes?  Es necesario que me rena con un nutricionista?  A qu nmero puedo llamar si tengo preguntas?  Cules son los mejores momentos para controlar la glucemia? Dnde encontrar ms informacin:  Asociacin Americana de la Diabetes (American Diabetes Association): diabetes.org/food-and-fitness/food  Academia de Nutricin y Information systems manager (Academy of Nutrition and  Dietetics): PokerClues.dk  Republic Diabetes y [place] y Water quality scientist [facility] of Diabetes and Digestive and Kidney Diseases) ([place], NIH): ContactWire.be Resumen  Un plan de alimentacin saludable lo ayudar a Aeronautical engineer glucemia y Theatre manager un estilo de vida saludable.  Trabajar con un especialista en dietas y nutricin (nutricionista) puede ayudarlo a Insurance claims handler de alimentacin para usted.  Tenga en cuenta que los carbohidratos y el alcohol tienen efectos inmediatos en sus niveles de glucemia. Es importante contar los carbohidratos y consumir alcohol con prudencia. Esta informacin no tiene Marine scientist el consejo del mdico. Asegrese de hacerle al mdico cualquier pregunta que tenga. Document Released: 09/27/2007 Document Revised: 10/10/2016 Document Reviewed: 10/10/2016 Elsevier Interactive Patient Education  2018 [place] inguinal en los adultos (Inguinal Hernia, Adult) Una hernia inguinal se produce cuando la grasa o los intestinos empujan a travs de la zona donde las piernas se unen al abdomen inferior (ingle) y forman una protuberancia redondeada (bulto). Esta afeccin se produce con el transcurso del tiempo. Existen tres tipos de hernias inguinales. Estos tipos son los siguientes:  Hernias que se pueden empujar hacia adentro del abdomen (son reducibles).  Hernias que no se pueden empujar hacia adentro del abdomen (encarceladas).  Hernias que no se pueden empujar hacia adentro del abdomen y pierden la irrigacin sangunea (estranguladas). Este tipo de hernia requiere ciruga de Freight forwarder. CUIDADOS EN EL HOGAR Estilo de vida  Beba suficiente lquido para mantener el pis (orina) claro o de color amarillo plido.  Coma gran cantidad de frutas, verduras y  cereales integrales. Estos tienen [employer]. Consulte a su mdico si tiene dudas.  Evite levantar objetos pesados.  Intente no estar de pie [employer].  No consuma productos que contengan tabaco. Estos incluyen cigarrillos, tabaco para mascar o cigarrillos electrnicos. Si necesita ayuda para dejar de fumar, consulte al mdico.  Mantenga un peso saludable. Instrucciones generales  No trate de volver a introducir la hernia a la fuerza.  Observe si la hernia cambia de color o de tamao. Informe a su mdico si se produce algn cambio.  Tome los medicamentos de venta libre y los recetados solamente como se lo haya indicado el mdico.  Consulting civil engineer a todas las visitas de control como se lo haya indicado el mdico. Esto es importante. SOLICITE AYUDA SI:  Tiene fiebre.  Aparecen nuevos sntomas.  Los sntomas empeoran. SOLICITE AYUDA DE INMEDIATO SI:  Tiene lo siguiente en la zona donde las piernas se unen al abdomen: ? Dolor que empeora repentinamente. ? Un bulto que crece rpidamente y no desaparece. ? Un bulto que se torna de color rojo o prpura. ? Un bulto que es doloroso al tacto.  Si es un hombre y el escroto: ? Le duele de repente. ? Cambia de tamao.  Tiene ganas constantes de vomitar (nuseas).  Vomita sin parar.  Siente que el corazn late mucho ms rpido que lo normal.  Tiene dificultad para defecar o eliminar gases Esta informacin no tiene Marine scientist  el consejo del mdico. Asegrese de hacerle al mdico cualquier pregunta que tenga. Document Released: 12/08/2009 Document Revised: 10/12/2015 Document Reviewed: 04/30/2014 Elsevier Interactive Patient Education  2018 [place] pulmonar obstructiva crnica (Chronic Obstructive Pulmonary Disease) La enfermedad pulmonar obstructiva crnica (EPOC) es una enfermedad pulmonar comn. En la EPOC, el flujo de aire que proviene de los pulmones est restringido. La forma en que trabajan  los pulmones probablemente nunca vuelva a la normalidad, pero puede hacer algo para mejorarlos y Medical laboratory scientific officer a Sports administrator. El mdico puede tratar la enfermedad con:  Medicamentos.  Oxgeno.  Ciruga pulmonar.  Cambios en la dieta.  Rehabilitacin. Esto puede requerir la participacin de un equipo de especialistas. CUIDADOS EN EL HOGAR  Tome todos los [employer] se lo haya indicado el mdico.  Evite los medicamentos o jarabes para la tos que le secan las vas respiratorias (como antihistamnicos) y que no le permiten eliminar el moco espeso. No es necesario que los evite si su mdico le indic lo contrario.  Si fuma, deje de hacerlo. Fumar empeora el problema.  Evite rodearse de factores que empeoran su respiracin (por ejemplo, humo, productos qumicos y vapores).  Use la terapia con oxgeno y la terapia para ayudar a mejorar los pulmones (rehabilitacin pulmonar), si el mdico se lo indic. Si necesita terapia con oxgeno en su casa, pregntele al mdico si debe comprar una herramienta para medir su nivel de oxgeno (oxmetro).  Evite a las personas que tengan una enfermedad contagiosa.  Evite salir cuando hace mucho calor, fro o humedad.  Consuma alimentos saludables. Consuma pequeas cantidades de alimentos con mayor frecuencia. Descanse antes de las comidas.  Mantngase activo, pero recuerde que tambin Administrator.  Asegrese de recibir todas las vacunas que el mdico le recomiende. Consulte a su mdico si necesita una vacuna contra la neumona.  Aprenda y [employer] consejos sobre cmo [place].  Aprenda y utilice consejos sobre cmo controlar su respiracin segn lo aconsejado por su mdico. Intente lo siguiente: ? Inspire (inhalar) por la nariz durante 1segundo. Luego frunza los labios y espire (exhale) a travs de los labios durante 2segundos. ? Coloque una [employer] vientre (abdomen). Inhale lentamente por la nariz durante 1segundo. La mano que est  sobre el vientre debe moverse. Frunza los labios y espire lentamente por los labios. La mano que est sobre su vientre debe moverse cuando exhale.  Aprenda y [employer] tos controlada para eliminar el moco de los pulmones. [place] son los siguientes: 1. Incline la cabeza un poco hacia adelante. 2. Inhale profundamente. 3. Trate de mantener el aire por 3 segundos. [place] veces. 5. Escupa el moco en un pauelo de papel. 6. Descanse y repita los pasos 1 o 2 veces, segn lo necesite.  SOLICITE AYUDA SI:  Escupe moco ms espeso que lo normal.  Hay un cambio en el color o consistencia del moco.  Le cuesta respirar ms de lo normal.  La respiracin es ms rpida que lo habitual.  SOLICITE AYUDA DE INMEDIATO SI:  Tiene dificultad para respirar mientras descansa.  Tiene dificultad para respirar y [employer] hacer lo siguiente: ? Poder hablar. ? Hacer actividades normales.  Le duele el pecho durante ms de 22minutos.  Tiene la piel ms [employer] lo normal.  El pulsioxmetro muestra que tiene un nivel bajo de oxgeno durante ms de 67minutos.  ASEGRESE DE QUE:  Comprende estas instrucciones.  Controlar su afeccin.  Recibir ayuda de inmediato si no mejora o si empeora.  Esta informacin no tiene Marine scientist el consejo del mdico. Asegrese de hacerle al mdico cualquier pregunta que tenga. Document Released: 10/15/2010 Document Revised: 07/11/2014 Document Reviewed: 02/14/2013 Elsevier Interactive Patient Education  2017 [employer].

## 2018-03-16 NOTE — Progress Notes (Signed)
Duane Cuevas 62 y.o.   Chief Complaint  Patient presents with  . Establish Care  . Emphysema    to discuss    HISTORY OF PRESENT ILLNESS: This is a 62 y.o. male here to establish care.  First visit to this office.  Has the following chronic medical problems: 1.  COPD and chronic smoker.  Uses albuterol inhaler as needed.  No other medication. 2.  Left sided inguinal hernia. 3.  History of diabetes.  On no medication. 4.  Erectile dysfunction.  Requesting medication.  HPI   Prior to Admission medications   Medication Sig Start Date End Date Taking? Authorizing Provider  albuterol (PROVENTIL) (2.5 MG/3ML) 0.083% nebulizer solution Take 6 mLs (5 mg total) by nebulization every 6 (six) hours as needed for wheezing or shortness of breath. 03/03/15  Yes Etta Quill, NP  fluconazole (DIFLUCAN) 100 MG tablet Take 1 tablet (100 mg total) by mouth daily. Patient not taking: Reported on 03/16/2018 09/25/15   Geradine Girt, DO  guaiFENesin-dextromethorphan (ROBITUSSIN DM) 100-10 MG/5ML syrup Take 5 mLs by mouth 3 (three) times daily as needed for cough. Patient not taking: Reported on 03/16/2018 11/30/17   Davonna Belling, MD  lansoprazole (PREVACID) 30 MG capsule Take 1 capsule (30 mg total) by mouth daily at 12 noon. Patient not taking: Reported on 03/16/2018 09/25/15   Geradine Girt, DO  metFORMIN (GLUCOPHAGE) 500 MG tablet Take 1 tablet (500 mg total) by mouth 2 (two) times daily with a meal. Patient not taking: Reported on 11/30/2017 09/21/15   Elgergawy, Silver Huguenin, MD  predniSONE (DELTASONE) 20 MG tablet Take 2 tablets (40 mg total) by mouth daily. Patient not taking: Reported on 03/16/2018 11/30/17   Davonna Belling, MD    No Known Allergies  Patient Active Problem List   Diagnosis Date Noted  . Abdominal pain   . Gastritis 09/23/2015  . Diabetes mellitus ([place]) 09/23/2015  . Hypokalemia 09/23/2015  . CAP (community acquired pneumonia) 09/19/2015  . COPD (chronic obstructive  pulmonary disease) ([place]) 10/14/2014    Past Medical History:  Diagnosis Date  . Diabetes mellitus ([place]) 10/2014  . Emphysema/COPD ([place]) 10/2014  . Hepatic steatosis   . Thrombocytopenia ([place]) 09/2015   platelets in 120s.     Past Surgical History:  Procedure Laterality Date  . ESOPHAGOGASTRODUODENOSCOPY N/A 09/23/2015   Procedure: ESOPHAGOGASTRODUODENOSCOPY (EGD);  Surgeon: Ladene Artist, MD;  Location: [facility];  Service: Endoscopy;  Laterality: N/A;  . FOOT SURGERY      Social History   Socioeconomic History  . Marital status: Married    Spouse name: Ramonita Lab  . Number of children: 6  . Years of education: Not on file  . Highest education level: Not on file  Occupational History  . Occupation: Employed    Fish farm manager: [employer]  Social Needs  . Financial resource strain: Not on file  . Food insecurity:    Worry: Not on file    Inability: Not on file  . Transportation needs:    Medical: Not on file    Non-medical: Not on file  Tobacco Use  . Smoking status: Current Every Day Smoker    Packs/day: 1.00    Types: Cigarettes  . Smokeless tobacco: Never Used  Substance and Sexual Activity  . Alcohol use: Yes    Alcohol/week: 6.0 standard drinks    Types: 6 Cans of beer per week  . Drug use: No  . Sexual activity: Never  Lifestyle  . Physical activity:  Days per week: Not on file    Minutes per session: Not on file  . Stress: Not on file  Relationships  . Social connections:    Talks on phone: Not on file    Gets together: Not on file    Attends religious service: Not on file    Active member of club or organization: Not on file    Attends meetings of clubs or organizations: Not on file    Relationship status: Not on file  . Intimate partner violence:    Fear of current or ex partner: Not on file    Emotionally abused: Not on file    Physically abused: Not on file    Forced sexual activity: Not on file  Other Topics Concern  . Not on file    Social History Narrative   Lives with wife.  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    Family History  Problem Relation Age of Onset  . Emphysema Mother   . Diabetes Mother   . Emphysema Father   . Cancer Sister        Stomach cancer     Review of Systems  Constitutional: Negative.  Negative for chills and fever.  HENT: Negative.  Negative for sore throat.   Eyes: Negative.  Negative for blurred vision and double vision.  Respiratory: Positive for cough, shortness of breath and wheezing.   Cardiovascular: Negative.  Negative for chest pain and palpitations.  Gastrointestinal: Negative.  Negative for abdominal pain, diarrhea, nausea and vomiting.  Genitourinary: Negative for dysuria and hematuria.       ED  Musculoskeletal: Negative.  Negative for back pain, joint pain, myalgias and neck pain.  Skin: Negative.  Negative for rash.  Neurological: Negative.  Negative for dizziness and headaches.  Endo/Heme/Allergies: Negative.     Vitals:   03/16/18 1329  BP: 128/73  Pulse: 84  Resp: 16  Temp: 98.1 F (36.7 C)  SpO2: 95%    Physical Exam  Constitutional: He is oriented to person, place, and time. He appears well-developed and well-nourished.  HENT:  Head: Normocephalic and atraumatic.  Nose: Nose normal.  Mouth/Throat: Oropharynx is clear and moist.  Eyes: Pupils are equal, round, and reactive to light. Conjunctivae and EOM are normal.  Neck: Normal range of motion. Neck supple.  Cardiovascular: Normal rate, regular rhythm and normal heart sounds.  Pulmonary/Chest: Effort normal and breath sounds normal.  Abdominal: Soft. Bowel sounds are normal. A hernia is present. Hernia confirmed positive in the left inguinal area.  Genitourinary: Testes normal. Uncircumcised.  Musculoskeletal: Normal range of motion. He exhibits no edema.  Neurological: He is alert and oriented to person, place, and time. No sensory deficit. He exhibits normal muscle tone.  Skin: Skin is  warm and dry. Capillary refill takes less than 2 seconds.  Psychiatric: He has a normal mood and affect. His behavior is normal.  Vitals reviewed.    ASSESSMENT & PLAN: Kushal was seen today for establish care and emphysema.  Diagnoses and all orders for this visit:  Chronic obstructive pulmonary disease, unspecified COPD type ([place]) -     budesonide-formoterol (SYMBICORT) 80-4.5 MCG/ACT inhaler; Inhale 2 puffs into the lungs 2 (two) times daily. -     CBC with Differential/Platelet  Unilateral recurrent inguinal hernia without obstruction or gangrene -     Ambulatory referral to General Surgery  History of diabetes mellitus -     Basic metabolic panel -  Hemoglobin A1c -     CBC with Differential/Platelet  Colon cancer screening -     Ambulatory referral to Gastroenterology  Erectile dysfunction, unspecified erectile dysfunction type -     sildenafil (VIAGRA) 100 MG tablet; Take 0.5-1 tablets (50-100 mg total) by mouth daily as needed for erectile dysfunction.    Patient Instructions       If you have lab work done today you will be contacted with your lab results within the next 2 weeks.  If you have not heard from [place] then please contact us. The fastest way to get your results is to register for My Chart.   IF you received an x-ray today, you will receive an invoice from [facility]. Please contact [facility] at [phone] with questions or concerns regarding your invoice.   IF you received labwork today, you will receive an invoice from [facility]. Please contact [facility] at [phone] with questions or concerns regarding your invoice.   Our billing staff will not be able to assist you with questions regarding bills from these companies.  You will be contacted with the lab results as soon as they are available. The fastest way to get your results is to activate your My Chart account. Instructions are located on the last page of this paperwork. If  you have not heard from [place] regarding the results in 2 weeks, please contact this office.     Diabetes mellitus y nutricin Diabetes Mellitus and Nutrition Si sufre de diabetes (diabetes mellitus), es muy importante tener hbitos alimenticios saludables debido a que sus niveles de Designer, television/film set sangre (glucosa) se ven afectados en gran medida por lo que come y bebe. [place] alimentos saludables en las cantidades [place], aproximadamente a la [employer] Technologies Corporation, [place] ayudar a:  Aeronautical engineer glucemia.  Disminuir el riesgo de sufrir una enfermedad cardaca.  Mejorar la presin arterial.  Science writer o mantener un peso saludable.  Todas las personas que sufren de diabetes son diferentes y cada una tiene necesidades diferentes en cuanto a un plan de alimentacin. El mdico puede recomendarle que trabaje con un especialista en dietas y nutricin (nutricionista) para Financial trader plan para usted. Su plan de alimentacin puede variar segn factores como:  Las caloras que necesita.  Los medicamentos que toma.  Su peso.  Sus niveles de glucemia, presin arterial y colesterol.  Su nivel de [place].  Otras afecciones que tenga, como enfermedades cardacas o renales.  Cmo me afectan los carbohidratos? Los carbohidratos afectan el nivel de glucemia ms que cualquier otro tipo de alimento. La ingesta de carbohidratos naturalmente aumenta la cantidad glucosa en la sangre. El recuento de carbohidratos es un mtodo destinado a Catering manager un registro de la cantidad de carbohidratos que se ingieren. El recuento de carbohidratos es importante para Theatre manager la glucemia a un nivel saludable, en especial si utiliza insulina o toma determinados medicamentos por va oral para la diabetes. Es importante saber la cantidad de carbohidratos que se pueden ingerir en cada comida sin correr Engineer, manufacturing. Esto es Psychologist, forensic. El nutricionista puede ayudarlo a calcular la cantidad de carbohidratos  que debe ingerir en cada comida y colacin. Los alimentos que contienen carbohidratos incluyen:  Pan, cereal, arroz, pasta y galletas.  Papas y maz.  Guisantes, frijoles y lentejas.  Leche y Estate agent.  Lambert Mody y [place].  Postres, como pasteles, galletitas, helado y caramelos.  Cmo me afecta el alcohol? El alcohol puede provocar disminuciones sbitas de la glucemia (hipoglucemia),  en especial si utiliza insulina o toma determinados medicamentos por va oral para la diabetes. La hipoglucemia es una afeccin potencialmente mortal. Los sntomas de la hipoglucemia (somnolencia, mareos y confusin) son similares a los sntomas de haber consumido demasiado alcohol. Si el mdico afirma que el alcohol es seguro para usted, siga estas pautas:  Limite el consumo de alcohol a no ms de 1 medida por da si es mujer y no est Music therapist, y a 2 medidas si es hombre. Una medida equivale a 12oz (378ml) de cerveza, 5oz (17ml) de vino o 1oz (15ml) de bebidas de alta graduacin alcohlica.  No beba con el estmago vaco.  Mantngase hidratado con agua, gaseosas dietticas o t helado sin azcar.  Tenga en cuenta que las gaseosas comunes, los jugos y otros refrescos pueden contener mucha azcar y se deben contar como carbohidratos.  Consejos para seguir Photographer las etiquetas de los alimentos  Comience por controlar el tamao de la porcin en la etiqueta. La cantidad de caloras, carbohidratos, grasas y otros nutrientes mencionados en la etiqueta se basan en una porcin del alimento. Muchos alimentos contienen ms de una porcin por envase.  Verifique la cantidad total de gramos (g) de carbohidratos totales en una porcin. Puede calcular la cantidad de porciones de carbohidratos al dividir el total de carbohidratos por 15. Por ejemplo, si un alimento posee un total de 30g de carbohidratos, equivale a 2 porciones de carbohidratos.  Verifique la cantidad de gramos (g) de grasas saturadas y  grasas trans en una porcin. Escoja alimentos que no contengan grasa o que tengan un bajo contenido.  Controle la cantidad de miligramos (mg) de sodio en una porcin. La State Farm de las personas deben limitar la ingesta de sodio total a menos de 2300mg  por Training and development officer.  Siempre consulte la informacin nutricional de los alimentos etiquetados como "con bajo contenido de grasa" o "sin grasa". Estos alimentos pueden ser ms altos en azcar agregada o en carbohidratos refinados y deben evitarse.  Hable con el nutricionista para identificar sus objetivos diarios en cuanto a los nutrientes mencionados en la etiqueta. De compras  Evite comprar alimentos procesados, enlatados o prehechos. Estos alimentos tienden a TEFL teacher cantidad de [place], sodio y azcar agregada.  Compre en la zona exterior de la tienda de comestibles. Esta incluye frutas y Northrop Grumman, granos a granel, carnes frescas y productos lcteos frescos. Coccin  Utilice mtodos de coccin a baja temperatura, como hornear, en lugar de mtodos de coccin a alta temperatura, como frer en abundante aceite.  Cocine con aceites saludables, como el aceite de [place], canola o [place].  Evite cocinar con manteca, crema o carnes con alto contenido de grasa. Planificacin de las comidas  International Paper comidas y las colaciones de forma regular, preferentemente a la misma hora todos [place]. Evite pasar largos perodos de tiempo sin comer.  Consuma alimentos ricos en fibra, como frutas frescas, verduras, frijoles y cereales integrales. Consulte al nutricionista sobre cuntas porciones de carbohidratos puede consumir en cada comida.  Consuma entre 4 y 6 onzas de protenas magras por da, como carnes [place], pollo, pescado, First Data Corporation o tofu. 1 onza equivale a 1 onza de carne, pollo o pescado, 1 huevo, o 1/4 taza de tofu.  Coma algunos alimentos por da que contengan grasas saludables, como aguacates, frutos secos, semillas y pescado. Estilo de  vida   Controle su nivel de glucemia con regularidad.  Haga ejercicio al menos 75minutos, 5das o ms por semana, o como se lo  haya indicado el [place] los Tenneco Inc se lo haya indicado el mdico.  No consuma ningn producto que contenga nicotina o tabaco, como cigarrillos y Psychologist, sport and exercise. Si necesita ayuda para dejar de fumar, consulte al Hess Corporation con un asesor o instructor en diabetes para identificar estrategias para controlar el estrs y cualquier desafo emocional y social. Cules son algunas de las preguntas que puedo hacerle a mi mdico?  Es necesario que me rena con Radio broadcast assistant en diabetes?  Es necesario que me rena con un nutricionista?  A qu nmero puedo llamar si tengo preguntas?  Cules son los mejores momentos para controlar la glucemia? Dnde encontrar ms informacin:  Asociacin Americana de la Diabetes (American Diabetes Association): diabetes.org/food-and-fitness/food  Academia de Nutricin y Information systems manager (Academy of Nutrition and Dietetics): PokerClues.dk  [place] Diabetes y [place] y Water quality scientist [facility] of Diabetes and Digestive and Kidney Diseases) ([place], NIH): ContactWire.be Resumen  Un plan de alimentacin saludable lo ayudar a Aeronautical engineer glucemia y Theatre manager un estilo de vida saludable.  Trabajar con un especialista en dietas y nutricin (nutricionista) puede ayudarlo a Insurance claims handler de alimentacin para usted.  Tenga en cuenta que los carbohidratos y el alcohol tienen efectos inmediatos en sus niveles de glucemia. Es importante contar los carbohidratos y consumir alcohol con prudencia. Esta informacin no tiene Marine scientist el consejo del mdico. Asegrese de hacerle al mdico cualquier pregunta que  tenga. Document Released: 09/27/2007 Document Revised: 10/10/2016 Document Reviewed: 10/10/2016 Elsevier Interactive Patient Education  2018 [place] inguinal en los adultos (Inguinal Hernia, Adult) Una hernia inguinal se produce cuando la grasa o los intestinos empujan a travs de la zona donde las piernas se unen al abdomen inferior (ingle) y forman una protuberancia redondeada (bulto). Esta afeccin se produce con el transcurso del tiempo. Existen tres tipos de hernias inguinales. Estos tipos son los siguientes:  Hernias que se pueden empujar hacia adentro del abdomen (son reducibles).  Hernias que no se pueden empujar hacia adentro del abdomen (encarceladas).  Hernias que no se pueden empujar hacia adentro del abdomen y pierden la irrigacin sangunea (estranguladas). Este tipo de hernia requiere ciruga de Freight forwarder. CUIDADOS EN EL HOGAR Estilo de vida  Beba suficiente lquido para mantener el pis (orina) claro o de color amarillo plido.  Coma gran cantidad de frutas, verduras y cereales integrales. Estos tienen Lowe's Companies. Consulte a su mdico si tiene dudas.  Evite levantar objetos pesados.  Intente no estar de pie Tech Data Corporation.  No consuma productos que contengan tabaco. Estos incluyen cigarrillos, tabaco para mascar o cigarrillos electrnicos. Si necesita ayuda para dejar de fumar, consulte al mdico.  Mantenga un peso saludable. Instrucciones generales  No trate de volver a introducir la hernia a la fuerza.  Observe si la hernia cambia de color o de tamao. Informe a su mdico si se produce algn cambio.  Tome los medicamentos de venta libre y los recetados solamente como se lo haya indicado el mdico.  Consulting civil engineer a todas las visitas de control como se lo haya indicado el mdico. Esto es importante. SOLICITE AYUDA SI:  Tiene fiebre.  Aparecen nuevos sntomas.  Los sntomas empeoran. SOLICITE AYUDA DE INMEDIATO SI:  Tiene lo siguiente en la  zona donde las piernas se unen al abdomen: ? Dolor que empeora repentinamente. ? Un bulto que crece rpidamente y no desaparece. ? Un bulto que se torna de color rojo o prpura. ?  Un bulto que es doloroso al tacto.  Si es un hombre y el escroto: ? Le duele de repente. ? Cambia de tamao.  Tiene ganas constantes de vomitar (nuseas).  Vomita sin parar.  Siente que el corazn late mucho ms rpido que lo normal.  Tiene dificultad para defecar o eliminar gases Esta informacin no tiene Marine scientist el consejo del mdico. Asegrese de hacerle al mdico cualquier pregunta que tenga. Document Released: 12/08/2009 Document Revised: 10/12/2015 Document Reviewed: 04/30/2014 Elsevier Interactive Patient Education  2018 [place] pulmonar obstructiva crnica (Chronic Obstructive Pulmonary Disease) La enfermedad pulmonar obstructiva crnica (EPOC) es una enfermedad pulmonar comn. En la EPOC, el flujo de aire que proviene de los pulmones est restringido. La forma en que trabajan los pulmones probablemente nunca vuelva a la normalidad, pero puede hacer algo para mejorarlos y Medical laboratory scientific officer a Sports administrator. El mdico puede tratar la enfermedad con:  Medicamentos.  Oxgeno.  Ciruga pulmonar.  Cambios en la dieta.  Rehabilitacin. Esto puede requerir la participacin de un equipo de especialistas. CUIDADOS EN EL HOGAR  Tome todos los Tenneco Inc se lo haya indicado el mdico.  Evite los medicamentos o jarabes para la tos que le secan las vas respiratorias (como antihistamnicos) y que no le permiten eliminar el moco espeso. No es necesario que los evite si su mdico le indic lo contrario.  Si fuma, deje de hacerlo. Fumar empeora el problema.  Evite rodearse de factores que empeoran su respiracin (por ejemplo, humo, productos qumicos y vapores).  Use la terapia con oxgeno y la terapia para ayudar a mejorar los pulmones (rehabilitacin pulmonar), si el mdico  se lo indic. Si necesita terapia con oxgeno en su casa, pregntele al mdico si debe comprar una herramienta para medir su nivel de oxgeno (oxmetro).  Evite a las personas que tengan una enfermedad contagiosa.  Evite salir cuando hace mucho calor, fro o humedad.  Consuma alimentos saludables. Consuma pequeas cantidades de alimentos con mayor frecuencia. Descanse antes de las comidas.  Mantngase activo, pero recuerde que tambin Administrator.  Asegrese de recibir todas las vacunas que el mdico le recomiende. Consulte a su mdico si necesita una vacuna contra la neumona.  Aprenda y General Mills consejos sobre cmo [place].  Aprenda y utilice consejos sobre cmo controlar su respiracin segn lo aconsejado por su mdico. Intente lo siguiente: ? Inspire (inhalar) por la nariz durante 1segundo. Luego frunza los labios y espire (exhale) a travs de los labios durante 2segundos. ? Coloque una Johnson Controls vientre (abdomen). Inhale lentamente por la nariz durante 1segundo. La mano que est sobre el vientre debe moverse. Frunza los labios y espire lentamente por los labios. La mano que est sobre su vientre debe moverse cuando exhale.  Aprenda y [employer] Group tos controlada para eliminar el moco de los pulmones. [place] son los siguientes: 1. Incline la cabeza un poco hacia adelante. 2. Inhale profundamente. 3. Trate de mantener el aire por 3 segundos. [place] veces. 5. Escupa el moco en un pauelo de papel. 6. Descanse y repita los pasos 1 o 2 veces, segn lo necesite.  SOLICITE AYUDA SI:  Escupe moco ms espeso que lo normal.  Hay un cambio en el color o consistencia del moco.  Le cuesta respirar ms de lo normal.  La respiracin es ms rpida que lo habitual.  SOLICITE AYUDA DE INMEDIATO SI:  Tiene dificultad para respirar mientras descansa.  Tiene dificultad para  respirar y UGI Corporation impide hacer lo siguiente: ? Poder  hablar. ? Hacer actividades normales.  Le duele el pecho durante ms de 97minutos.  Tiene la piel ms Temple[employer]Inland lo normal.  El pulsioxmetro muestra que tiene un nivel bajo de oxgeno durante ms de 40minutos.  ASEGRESE DE QUE:  Comprende estas instrucciones.  Controlar su afeccin.  Recibir ayuda de inmediato si no mejora o si empeora.  Esta informacin no tiene Marine scientist el consejo del mdico. Asegrese de hacerle al mdico cualquier pregunta que tenga. Document Released: 10/15/2010 Document Revised: 07/11/2014 Document Reviewed: 02/14/2013 Elsevier Interactive Patient Education  2017 Elsevier Inc.      Agustina Caroli, MD [facility]

## 2018-03-17 LAB — CBC WITH DIFFERENTIAL/PLATELET
BASOS: 1 %
Basophils Absolute: 0 10*3/uL (ref 0.0–0.2)
EOS (ABSOLUTE): 0.1 10*3/uL (ref 0.0–0.4)
Eos: 2 %
Hematocrit: 48.2 % (ref 37.5–51.0)
Hemoglobin: 16.6 g/dL (ref 13.0–17.7)
Immature Grans (Abs): 0 10*3/uL (ref 0.0–0.1)
Immature Granulocytes: 0 %
Lymphocytes Absolute: 1.3 10*3/uL (ref 0.7–3.1)
Lymphs: 16 %
MCH: 33.1 pg — ABNORMAL HIGH (ref 26.6–33.0)
MCHC: 34.4 g/dL (ref 31.5–35.7)
MCV: 96 fL (ref 79–97)
MONOS ABS: 0.7 10*3/uL (ref 0.1–0.9)
Monocytes: 8 %
Neutrophils Absolute: 6 10*3/uL (ref 1.4–7.0)
Neutrophils: 73 %
Platelets: 227 10*3/uL (ref 150–450)
RBC: 5.02 x10E6/uL (ref 4.14–5.80)
RDW: 12 % — ABNORMAL LOW (ref 12.3–15.4)
WBC: 8.2 10*3/uL (ref 3.4–10.8)

## 2018-03-17 LAB — BASIC METABOLIC PANEL
BUN / CREAT RATIO: 17 (ref 10–24)
BUN: 14 mg/dL (ref 8–27)
CHLORIDE: 97 mmol/L (ref 96–106)
CO2: 24 mmol/L (ref 20–29)
Calcium: 9.7 mg/dL (ref 8.6–10.2)
Creatinine, Ser: 0.82 mg/dL (ref 0.76–1.27)
GFR calc Af Amer: 110 mL/min/{1.73_m2} (ref >59)
GFR calc non Af Amer: 95 mL/min/{1.73_m2} (ref >59)
GLUCOSE: 81 mg/dL (ref 65–99)
POTASSIUM: 4.7 mmol/L (ref 3.5–5.2)
Sodium: 138 mmol/L (ref 134–144)

## 2018-03-17 LAB — HEMOGLOBIN A1C
Est. average glucose Bld gHb Est-mCnc: 126 mg/dL
HEMOGLOBIN A1C: 6 % — AB (ref 4.8–5.6)

## 2018-03-19 ENCOUNTER — Encounter: Payer: Self-pay | Admitting: Emergency Medicine

## 2018-03-20 ENCOUNTER — Encounter: Payer: Self-pay | Admitting: *Deleted

## 2018-03-20 NOTE — Progress Notes (Signed)
Letter sent.

## 2018-03-28 ENCOUNTER — Encounter: Payer: Self-pay | Admitting: Gastroenterology

## 2018-04-30 ENCOUNTER — Ambulatory Visit (AMBULATORY_SURGERY_CENTER): Payer: Self-pay | Admitting: *Deleted

## 2018-04-30 VITALS — Ht 68.5 in | Wt 150.6 lb

## 2018-04-30 DIAGNOSIS — Z1211 Encounter for screening for malignant neoplasm of colon: Secondary | ICD-10-CM

## 2018-04-30 MED ORDER — NA SULFATE-K SULFATE-MG SULF 17.5-3.13-1.6 GM/177ML PO SOLN: 1.0000 | Freq: Once | ORAL | 0 refills | Status: AC

## 2018-04-30 NOTE — Progress Notes (Signed)
No egg or soy allergy known to patient  No issues with past sedation with any surgeries  or procedures, no intubation problems  No diet pills per patient No home 02 use per patient  No blood thinners per patient  Pt denies issues with constipation  No A fib or A flutter   EMMI video sent to pt's e mail  Suprep sample-- pt has no insurnace- Self pay-  Lot [id number]  Exp 8/21 as directed - instructions given in Spanish  Daughter in [place] to interpret for Pt- he does speak some English- Had pt sign release of responsibility for interpretation form in pv today

## 2018-05-07 ENCOUNTER — Ambulatory Visit: Payer: Self-pay | Admitting: Surgery

## 2018-05-07 NOTE — H&P (View-Only) (Signed)
Duane Cuevas Documented: 05/07/2018 11:20 AM Location: [facility] Patient #: [id number] DOB: Feb 19, 1956 Single / Language: Cleophus Molt / Race: White Male  History of Present Illness Duane Moores A. Lev Cervone MD; 05/07/2018 12:39 PM) Patient words: Patient is sent at the request of Dr. Jethro Bolus with a left groin bulge. This has been present for 4 months. It is getting larger causing mild to moderate discomfort especially with coughing, straining, or lifting. Locations left groin. The bulge is smaller when he lays back. He denies any associated nausea or vomiting.  The patient is a 62 year old male.   Past Surgical History (Tanisha A. Owens Shark, [place]; 05/07/2018 11:20 AM) No pertinent past surgical history  Diagnostic Studies History (Tanisha A. Owens Shark, [place]; 05/07/2018 11:20 AM) Colonoscopy never  Allergies (Tanisha A. Owens Shark, [place]; 05/07/2018 11:21 AM) No Known Drug Allergies [05/07/2018]: Allergies Reconciled  Medication History (Tanisha A. Owens Shark, [place]; 05/07/2018 11:21 AM) Albuterol (90MCG/ACT Aerosol Soln, Inhalation) Active. Medications Reconciled  Social History (Tanisha A. Owens Shark, [place]; 05/07/2018 11:20 AM) Alcohol use Heavy alcohol use. Caffeine use Coffee. No drug use Tobacco use Current every day smoker.  Family History (Tanisha A. Owens Shark, RMA; 05/07/2018 11:20 AM) Cancer Sister. Diabetes Mellitus Brother, Mother, Sister. Thyroid problems Daughter, Sister.  Other Problems (Tanisha A. Owens Shark, [place]; 05/07/2018 11:20 AM) Other disease, cancer, significant illness     Review of Systems (Desree Leap A. Carrina Schoenberger MD; 05/07/2018 12:40 PM) General Present- Weight Loss. Not Present- Appetite Loss, Chills, Fatigue, Fever, Night Sweats and Weight Gain. HEENT Not Present- Earache, Hearing Loss, Hoarseness, Nose Bleed, Oral Ulcers, Ringing in the Ears, Seasonal Allergies, Sinus Pain, Sore Throat, Visual Disturbances, Wears glasses/contact lenses and Yellow Eyes. Respiratory  Present- Wheezing. Not Present- Bloody sputum, Chronic Cough, Difficulty Breathing and Snoring. Breast Not Present- Breast Mass, Breast Pain, Nipple Discharge and Skin Changes. Gastrointestinal Not Present- Abdominal Pain, Bloating, Bloody Stool, Change in Bowel Habits, Chronic diarrhea, Constipation, Difficulty Swallowing, Excessive gas, Gets full quickly at meals, Hemorrhoids, Indigestion, Nausea, Rectal Pain and Vomiting. Male Genitourinary Not Present- Blood in Urine, Change in Urinary Stream, Frequency, Impotence, Nocturia, Painful Urination, Urgency and Urine Leakage. All other systems negative  Vitals (Tanisha A. Brown RMA; 05/07/2018 11:21 AM) 05/07/2018 11:20 AM Weight: 153.6 lb Height: 69in Body Surface Area: 1.85 m Body Mass Index: 22.68 kg/m  Temp.: 98.45F  Pulse: 89 (Regular)  BP: 134/82 (Sitting, Left Arm, Standard)      Physical Exam (Johnson Arizola A. Markavious Micco MD; 05/07/2018 12:39 PM)  General Mental Status-Alert. General Appearance-Consistent with stated age. Hydration-Well hydrated. Voice-Normal.  Head and Neck Head-normocephalic, atraumatic with no lesions or palpable masses. Trachea-midline. Thyroid Gland Characteristics - normal size and consistency.  Chest and Lung Exam Chest and lung exam reveals -quiet, even and easy respiratory effort with no use of accessory muscles and on auscultation, normal breath sounds, no adventitious sounds and normal vocal resonance. Inspection Chest Wall - Normal. Back - normal.  Cardiovascular Cardiovascular examination reveals -normal heart sounds, regular rate and rhythm with no murmurs and normal pedal pulses bilaterally.  Abdomen Inspection Inspection of the abdomen reveals - No Hernias. Skin - Scar - no surgical scars. Palpation/Percussion Palpation and Percussion of the abdomen reveal - Soft, Non Tender, No Rebound tenderness, No Rigidity (guarding) and No  hepatosplenomegaly. Auscultation Auscultation of the abdomen reveals - Bowel sounds normal.  Male Genitourinary Note: Large reducible left inguinal scrotal hernia. No evidence of riding or hernia. Reducible hernia. Testicle is normal.  Neurologic Neurologic evaluation reveals -alert and oriented x 3  with no impairment of recent or remote memory. Mental Status-Normal.  Musculoskeletal Normal Exam - Left-Upper Extremity Strength Normal and Lower Extremity Strength Normal. Normal Exam - Right-Upper Extremity Strength Normal and Lower Extremity Strength Normal.    Assessment & Plan (Margy Sumler A. Chrystian Cupples MD; 05/07/2018 12:40 PM)  LEFT INGUINAL HERNIA (K40.90) Impression: Discuss open repair of left inguinal hernia. Risk and benefits discussed. Use of mesh and long-term expectations discussed. He has agreed to proceed. Cautioned him about smoking and recommended smoking cessation to reduce complication rate and recurrence rates. The risk of hernia repair include bleeding, infection, organ injury, bowel injury, bladder injury, nerve injury recurrent hernia, blood clots, worsening of underlying condition, chronic pain, mesh use, open surgery, death, and the need for other operattions. Pt agrees to proceed  Current Plans You are being scheduled for surgery- Our schedulers will call you.  You should hear from our office's scheduling department within 5 working days about the location, date, and time of surgery. We try to make accommodations for patient's preferences in scheduling surgery, but sometimes the OR schedule or the surgeon's schedule prevents [place] from making those accommodations.  If you have not heard from our office [phone]) in 5 working days, call the office and ask for your surgeon's nurse.  If you have other questions about your diagnosis, plan, or surgery, call the office and ask for your surgeon's nurse.  Pt Education - Pamphlet Given - Hernia Surgery: discussed  with patient and provided information. Pt Education - CCS Mesh education: discussed with patient and provided information. Pt Education - CCS STOP SMOKING!

## 2018-05-07 NOTE — H&P (Signed)
Duane Cuevas Documented: 05/07/2018 11:20 AM Location: [facility] Patient #: [id number] DOB: Apr 03, 1956 Single / Language: Duane Cuevas / Race: White Male  History of Present Illness Duane Moores A. Alfreddie Consalvo MD; 05/07/2018 12:39 PM) Patient words: Patient is sent at the request of Dr. Jethro Bolus with a left groin bulge. This has been present for 4 months. It is getting larger causing mild to moderate discomfort especially with coughing, straining, or lifting. Locations left groin. The bulge is smaller when he lays back. He denies any associated nausea or vomiting.  The patient is a 62 year old male.   Past Surgical History (Duane Cuevas, [place]; 05/07/2018 11:20 AM) No pertinent past surgical history  Diagnostic Studies History (Duane Cuevas, [place]; 05/07/2018 11:20 AM) Colonoscopy never  Allergies (Duane Cuevas, [place]; 05/07/2018 11:21 AM) No Known Drug Allergies [05/07/2018]: Allergies Reconciled  Medication History (Duane Cuevas, [place]; 05/07/2018 11:21 AM) Albuterol (90MCG/ACT Aerosol Soln, Inhalation) Active. Medications Reconciled  Social History (Duane Cuevas, [place]; 05/07/2018 11:20 AM) Alcohol use Heavy alcohol use. Caffeine use Coffee. No drug use Tobacco use Current every day smoker.  Family History (Duane Cuevas, Cue[place]; 05/07/2018 11:20 AM) Cancer Sister. Diabetes Mellitus Brother, Mother, Sister. Thyroid problems Daughter, Sister.  Other Problems (Duane Cuevas, [place]; 05/07/2018 11:20 AM) Other disease, cancer, significant illness     Review of Systems (Duane Lueck A. Edmon Magid MD; 05/07/2018 12:40 PM) General Present- Weight Loss. Not Present- Appetite Loss, Chills, Fatigue, Fever, Night Sweats and Weight Gain. HEENT Not Present- Earache, Hearing Loss, Hoarseness, Nose Bleed, Oral Ulcers, Ringing in the Ears, Seasonal Allergies, Sinus Pain, Sore Throat, Visual Disturbances, Wears glasses/contact lenses and Yellow Eyes. Respiratory  Present- Wheezing. Not Present- Bloody sputum, Chronic Cough, Difficulty Breathing and Snoring. Breast Not Present- Breast Mass, Breast Pain, Nipple Discharge and Skin Changes. Gastrointestinal Not Present- Abdominal Pain, Bloating, Bloody Stool, Change in Bowel Habits, Chronic diarrhea, Constipation, Difficulty Swallowing, Excessive gas, Gets full quickly at meals, Hemorrhoids, Indigestion, Nausea, Rectal Pain and Vomiting. Male Genitourinary Not Present- Blood in Urine, Change in Urinary Stream, Frequency, Impotence, Nocturia, Painful Urination, Urgency and Urine Leakage. All other systems negative  Vitals (Duane Cuevas; 05/07/2018 11:21 AM) 05/07/2018 11:20 AM Weight: 153.6 lb Height: 69in Body Surface Area: 1.85 m Body Mass Index: 22.68 kg/m  Temp.: 98.68F  Pulse: 89 (Regular)  BP: 134/82 (Sitting, Left Arm, Standard)      Physical Exam (Duane Sparlin A. Bernette Seeman MD; 05/07/2018 12:39 PM)  General Mental Status-Alert. General Appearance-Consistent with stated age. Hydration-Well hydrated. Voice-Normal.  Head and Neck Head-normocephalic, atraumatic with no lesions or palpable masses. Trachea-midline. Thyroid Gland Characteristics - normal size and consistency.  Chest and Lung Exam Chest and lung exam reveals -quiet, even and easy respiratory effort with no use of accessory muscles and on auscultation, normal breath sounds, no adventitious sounds and normal vocal resonance. Inspection Chest Wall - Normal. Back - normal.  Cardiovascular Cardiovascular examination reveals -normal heart sounds, regular rate and rhythm with no murmurs and normal pedal pulses bilaterally.  Abdomen Inspection Inspection of the abdomen reveals - No Hernias. Skin - Scar - no surgical scars. Palpation/Percussion Palpation and Percussion of the abdomen reveal - Soft, Non Tender, No Rebound tenderness, No Rigidity (guarding) and No  hepatosplenomegaly. Auscultation Auscultation of the abdomen reveals - Bowel sounds normal.  Male Genitourinary Note: Large reducible left inguinal scrotal hernia. No evidence of riding or hernia. Reducible hernia. Testicle is normal.  Neurologic Neurologic evaluation reveals -alert and oriented x 3  with no impairment of recent or remote memory. Mental Status-Normal.  Musculoskeletal Normal Exam - Left-Upper Extremity Strength Normal and Lower Extremity Strength Normal. Normal Exam - Right-Upper Extremity Strength Normal and Lower Extremity Strength Normal.    Assessment & Plan (Duane Schor A. Jes Costales MD; 05/07/2018 12:40 PM)  LEFT INGUINAL HERNIA (K40.90) Impression: Discuss open repair of left inguinal hernia. Risk and benefits discussed. Use of mesh and long-term expectations discussed. He has agreed to proceed. Cautioned him about smoking and recommended smoking cessation to reduce complication rate and recurrence rates. The risk of hernia repair include bleeding, infection, organ injury, bowel injury, bladder injury, nerve injury recurrent hernia, blood clots, worsening of underlying condition, chronic pain, mesh use, open surgery, death, and the need for other operattions. Pt agrees to proceed  Current Plans You are being scheduled for surgery- Our schedulers will call you.  You should hear from our office's scheduling department within 5 working days about the location, date, and time of surgery. We try to make accommodations for patient's preferences in scheduling surgery, but sometimes the OR schedule or the surgeon's schedule prevents [place] from making those accommodations.  If you have not heard from our office [phone]) in 5 working days, call the office and ask for your surgeon's nurse.  If you have other questions about your diagnosis, plan, or surgery, call the office and ask for your surgeon's nurse.  Pt Education - Pamphlet Given - Hernia Surgery: discussed  with patient and provided information. Pt Education - CCS Mesh education: discussed with patient and provided information. Pt Education - CCS STOP SMOKING!

## 2018-05-10 ENCOUNTER — Encounter: Payer: Self-pay | Admitting: Gastroenterology

## 2018-05-10 ENCOUNTER — Ambulatory Visit (AMBULATORY_SURGERY_CENTER): Payer: Self-pay | Admitting: Gastroenterology

## 2018-05-10 VITALS — BP 141/76 | HR 86 | Temp 99.6°F | Resp 20 | Ht 68.0 in | Wt 145.0 lb

## 2018-05-10 DIAGNOSIS — D127 Benign neoplasm of rectosigmoid junction: Secondary | ICD-10-CM

## 2018-05-10 DIAGNOSIS — D120 Benign neoplasm of cecum: Secondary | ICD-10-CM

## 2018-05-10 DIAGNOSIS — Z1211 Encounter for screening for malignant neoplasm of colon: Secondary | ICD-10-CM

## 2018-05-10 HISTORY — PX: COLONOSCOPY WITH PROPOFOL: SHX5780

## 2018-05-10 NOTE — Op Note (Signed)
[facility] Patient Name: Duane Cuevas Procedure Date: 05/10/2018 11:19 AM MRN: [id number] Endoscopist: Remo Lipps P. Havery Moros , MD Age: 62 Referring MD:  Date of Birth: 1956-07-03 Gender: Male Account #: [id number] Procedure:                Colonoscopy Indications:              Screening for colorectal malignant neoplasm, This                            is the patient's first colonoscopy Medicines:                Monitored Anesthesia Care Procedure:                Pre-Anesthesia Assessment:                           - Prior to the procedure, a History and Physical                            was performed, and patient medications and                            allergies were reviewed. The patient's tolerance of                            previous anesthesia was also reviewed. The risks                            and benefits of the procedure and the sedation                            options and risks were discussed with the patient.                            All questions were answered, and informed consent                            was obtained. Prior Anticoagulants: The patient has                            taken no previous anticoagulant or antiplatelet                            agents. ASA Grade Assessment: III - A patient with                            severe systemic disease. After reviewing the risks                            and benefits, the patient was deemed in                            satisfactory condition to undergo the procedure.  After obtaining informed consent, the colonoscope                            was passed under direct vision. Throughout the                            procedure, the patient's blood pressure, pulse, and                            oxygen saturations were monitored continuously. The                            Colonoscope was introduced through the anus and                            advanced to the  the cecum, identified by                            appendiceal orifice and ileocecal valve. The                            colonoscopy was performed without difficulty. The                            patient tolerated the procedure well. The quality                            of the bowel preparation was good. The ileocecal                            valve, appendiceal orifice, and rectum were                            photographed. Scope In: 11:24:09 AM Scope Out: 11:47:24 AM Scope Withdrawal Time: 0 hours 19 minutes 40 seconds  Total Procedure Duration: 0 hours 23 minutes 15 seconds  Findings:                 The perianal and digital rectal examinations were                            normal.                           Multiple small-mouthed diverticula were found in                            the sigmoid colon and ascending colon.                           A 3 mm polyp was found in the cecum. The polyp was                            sessile. The polyp was removed with a cold snare.  Resection and retrieval were complete.                           A 4 mm polyp was found in the recto-sigmoid colon.                            The polyp was sessile. The polyp was removed with a                            cold snare. Resection and retrieval were complete.                           The colon was tortuous.                           The exam was otherwise without abnormality. Complications:            No immediate complications. Estimated blood loss:                            Minimal. Estimated Blood Loss:     Estimated blood loss was minimal. Impression:               - Diverticulosis in the sigmoid colon and in the                            ascending colon.                           - One 3 mm polyp in the cecum, removed with a cold                            snare. Resected and retrieved.                           - One 4 mm polyp at the recto-sigmoid colon,                             removed with a cold snare. Resected and retrieved.                           - Tortuous colon.                           - The examination was otherwise normal. Recommendation:           - Patient has a contact number available for                            emergencies. The signs and symptoms of potential                            delayed complications were discussed with the                            patient.  Return to normal activities tomorrow.                            Written discharge instructions were provided to the                            patient.                           - Resume previous diet.                           - Await pathology results.                           - Continue present medications. Remo Lipps P. Armbruster, MD 05/10/2018 11:52:56 AM This report has been signed electronically.

## 2018-05-10 NOTE — Progress Notes (Signed)
Called to room to assist during endoscopic procedure.  Patient ID and intended procedure confirmed with present staff. Received instructions for my participation in the procedure from the performing physician.  

## 2018-05-10 NOTE — Progress Notes (Signed)
Report given to PACU, vss 

## 2018-05-10 NOTE — Patient Instructions (Signed)
Handouts given on polyps and diverticulosis.  USTED TUVO UN PROCEDIMIENTO ENDOSCPICO HOY EN EL [facility]:   Lea el informe del procedimiento que se le entreg para cualquier pregunta especfica sobre lo que se Primary school teacher.  Si el informe del examen no responde a sus preguntas, por favor llame a su gastroenterlogo para aclararlo.  Si usted solicit que no se le den [employer] de lo que se Estate manager/land agent en su procedimiento al [employer] va a cuidar, entonces el informe del procedimiento se ha incluido en un sobre sellado para que usted lo revise despus cuando le sea ms conveniente.   LO QUE PUEDE ESPERAR: Algunas sensaciones de hinchazn en el abdomen.  Puede tener ms gases de lo normal.  El caminar puede ayudarle a eliminar el aire que se le puso en el tracto gastrointestinal durante el procedimiento y reducir la hinchazn.  Si le hicieron una endoscopia inferior (como una colonoscopia o una sigmoidoscopia flexible), podra notar manchas de sangre en las heces fecales o en el papel higinico.  Si se someti a una preparacin intestinal para su procedimiento, es posible que no tenga una evacuacin intestinal normal durante [employer].   Tenga en cuenta:  Es posible que note un poco de irritacin y congestin en la nariz o algn drenaje.  Esto es debido al oxgeno [employer] durante su procedimiento.  No hay que preocuparse y esto debe desaparecer ms o Scientist, research (medical).   SNTOMAS PARA REPORTAR INMEDIATAMENTE:  Despus de una endoscopia inferior (colonoscopia o sigmoidoscopia flexible):  Cantidades excesivas de sangre en las heces fecales  Sensibilidad significativa o empeoramiento de los dolores abdominales   Hinchazn aguda del abdomen que antes no tena   Fiebre de 100F o ms   Despus de la endoscopia superior (EGD)  Vmitos de Biochemist, clinical o material como caf molido   Dolor en el pecho o dolor debajo de los omplatos que antes no tena   Dolor o dificultad  persistente para tragar  Falta de aire que antes no tena   Fiebre de 100F o ms  Heces fecales negras y pegajosas   Para asuntos urgentes o de Freight forwarder, puede comunicarse con un gastroenterlogo a cualquier hora llamando al [phone].  DIETA:  Recomendamos una comida pequea al principio, pero luego puede continuar con su dieta normal.  Tome muchos lquidos, Teacher, adult education las bebidas alcohlicas durante 24 horas.    ACTIVIDAD:  Debe planear tomarse las cosas con calma por el resto del da y no debe CONDUCIR ni usar maquinaria pesada Programmer, applications (debido a los medicamentos de sedacin utilizados durante el examen).     SEGUIMIENTO: Nuestro personal llamar al nmero que aparece en su historial al siguiente da hbil de su procedimiento para ver cmo se siente y para responder cualquier pregunta o inquietud que pueda tener con respecto a la informacin que se le dio despus del procedimiento. Si no podemos contactarle, le dejaremos un mensaje.  Sin embargo, si se siente bien y no tiene Paediatric nurse, no es necesario que nos devuelva la llamada.  Asumiremos que ha regresado a sus actividades diarias normales sin incidentes. Si se le tomaron algunas biopsias, le contactaremos por telfono o por carta en las prximas 3 semanas.  Si no ha sabido [employer] biopsias en el transcurso de 3 semanas, por favor llmenos al [phone].   FIRMAS/CONFIDENCIALIDAD: Usted y/o el acompaante que le cuide han firmado documentos que se ingresarn en su historial mdico  electrnico.  Estas firmas atestiguan el hecho de que la informacin anterior

## 2018-05-11 ENCOUNTER — Telehealth: Payer: Self-pay

## 2018-05-11 NOTE — Pre-Procedure Instructions (Signed)
Idan Prime  05/11/2018      [id number], [address] [place] [address] Phone: [phone] Fax: [phone]    Your procedure is scheduled on Tuesday November 12th.  Report to [facility] Admitting at 12:00 pm  Call this number if you have problems the morning of surgery:  [phone]   Remember:  Do not eat after midnight.  You may drink clear liquids until 11:00am .  Clear liquids allowed IOE:VOJJK, Juice (non-citric and without pulp), Carbonated beverages, Clear Tea, Black Coffee only and Gatorade    Take these medicines the morning of surgery:  budesonide-formoterol (SYMBICORT) inhaler albuterol (PROVENTIL HFA;VENTOLIN HFA) inhaler- if needed (bring with you to the hospital) albuterol (PROVENTIL) (2.5 MG/3ML) nebulizer solution- if needed  7 days prior to surgery STOP taking any Aspirin(unless otherwise instructed by your surgeon), Aleve, Naproxen, Ibuprofen, Motrin, Advil, Goody's, BC's, all herbal medications, fish oil, and all vitamins   HOW TO MANAGE YOUR DIABETES BEFORE AND AFTER SURGERY  Why is it important to control my blood sugar before and after surgery? . Improving blood sugar levels before and after surgery helps healing and can limit problems. . A way of improving blood sugar control is eating a healthy diet by: o  Eating less sugar and carbohydrates o  Increasing activity/exercise o  Talking with your doctor about reaching your blood sugar goals . High blood sugars (greater than 180 mg/dL) can raise your risk of infections and slow your recovery, so you will need to focus on controlling your diabetes during the weeks before surgery. . Make sure that the doctor who takes care of your diabetes knows about your planned surgery including the date and location.  How do I manage my blood sugar before surgery? . Check your blood sugar at  least 4 times a day, starting 2 days before surgery, to make sure that the level is not too high or low. o Check your blood sugar the morning of your surgery when you wake up and every 2 hours until you get to the Short Stay unit. . If your blood sugar is less than 70 mg/dL, you will need to treat for low blood sugar: o Do not take insulin. o Treat a low blood sugar (less than 70 mg/dL) with  cup of clear juice (cranberry or apple), 4 glucose tablets, OR glucose gel. Recheck blood sugar in 15 minutes after treatment (to make sure it is greater than 70 mg/dL). If your blood sugar is not greater than 70 mg/dL on recheck, call [phone] o  for further instructions. . Report your blood sugar to the short stay nurse when you get to Short Stay.  . If you are admitted to the hospital after surgery: o Your blood sugar will be checked by the staff and you will probably be given insulin after surgery (instead of oral diabetes medicines) to make sure you have good blood sugar levels. o The goal for blood sugar control after surgery is 80-180 mg/dL.     WHAT DO I DO ABOUT MY DIABETES MEDICATION?   Marland Kitchen Do not take oral diabetes medicines (pills):  the morning of surgery.     Do not wear jewelry  Do not wear lotions, powders, or colognes, or deodorant.  Do not shave 48 hours prior to surgery.  Men may shave face and neck.  Do not  bring valuables to the hospital.  [facility] is not responsible for any belongings or valuables.  Contacts, eyeglasses, hearing aids, dentures or bridgework may not be worn into surgery.  Leave your suitcase in the car.  After surgery it may be brought to your room.  For patients admitted to the hospital, discharge time will be determined by your treatment team.  Patients discharged the day of surgery will not be allowed to drive home.   [facility]- Preparing For Surgery  Before surgery, you can play an important role. Because skin is not sterile, your skin needs to  be as free of germs as possible. You can reduce the number of germs on your skin by washing with CHG (chlorahexidine gluconate) Soap before surgery.  CHG is an antiseptic cleaner which kills germs and bonds with the skin to continue killing germs even after washing.    Oral Hygiene is also important to reduce your risk of infection.  Remember - BRUSH YOUR TEETH THE MORNING OF SURGERY WITH YOUR REGULAR TOOTHPASTE  Please do not use if you have an allergy to CHG or antibacterial soaps. If your skin becomes reddened/irritated stop using the CHG.  Do not shave (including legs and underarms) for at least 48 hours prior to first CHG shower. It is OK to shave your face.  Please follow these instructions carefully.   1. Shower the NIGHT BEFORE SURGERY and the MORNING OF SURGERY with CHG.   2. If you chose to wash your hair, wash your hair first as usual with your normal shampoo.  3. After you shampoo, rinse your hair and body thoroughly to remove the shampoo.  4. Use CHG as you would any other liquid soap. You can apply CHG directly to the skin and wash gently with a scrungie or a clean washcloth.   5. Apply the CHG Soap to your body ONLY FROM THE NECK DOWN.  Do not use on open wounds or open sores. Avoid contact with your eyes, ears, mouth and genitals (private parts). Wash Face and genitals (private parts)  with your normal soap.  6. Wash thoroughly, paying special attention to the area where your surgery will be performed.  7. Thoroughly rinse your body with warm water from the neck down.  8. DO NOT shower/wash with your normal soap after using and rinsing off the CHG Soap.  9. Pat yourself dry with a CLEAN TOWEL.  10. Wear CLEAN PAJAMAS to bed the night before surgery, wear comfortable clothes the morning of surgery  11. Place CLEAN SHEETS on your bed the night of your first shower and DO NOT SLEEP WITH PETS.    Day of Surgery: Shower as stated above. Do not apply any  deodorants/lotions.  Please wear clean clothes to the hospital/surgery center.   Remember to brush your teeth WITH YOUR REGULAR TOOTHPASTE.   Please read over the following fact sheets that you were given.

## 2018-05-11 NOTE — Telephone Encounter (Signed)
  Follow up Call-  Call back number 05/10/2018  Post procedure Call Back phone  # [phone] -[name] daughter, speaks english  Permission to leave phone message Yes  Some recent data might be hidden     Patient questions:  Do you have a fever, pain , or abdominal swelling? No. Pain Score  0 *  Have you tolerated food without any problems? Yes.    Have you been able to return to your normal activities? Yes.    Do you have any questions about your discharge instructions: Diet   No. Medications  No. Follow up visit  No.  Do you have questions or concerns about your Care? No.  Actions: * If pain score is 4 or above: No action needed, pain <4.

## 2018-05-14 ENCOUNTER — Other Ambulatory Visit: Payer: Self-pay

## 2018-05-14 ENCOUNTER — Encounter (HOSPITAL_COMMUNITY)
Admission: RE | Admit: 2018-05-14 | Discharge: 2018-05-14 | Disposition: A | Discharge status: Home / Self Care | Payer: Self-pay | Source: Ambulatory Visit | Attending: Surgery | Admitting: Surgery

## 2018-05-14 ENCOUNTER — Encounter (HOSPITAL_COMMUNITY): Payer: Self-pay

## 2018-05-14 DIAGNOSIS — Z01812 Encounter for preprocedural laboratory examination: Secondary | ICD-10-CM | POA: Insufficient documentation

## 2018-05-14 LAB — COMPREHENSIVE METABOLIC PANEL
ALK PHOS: 63 U/L (ref 38–126)
ALT: 20 U/L (ref 0–44)
ANION GAP: 5 (ref 5–15)
AST: 20 U/L (ref 15–41)
Albumin: 3.6 g/dL (ref 3.5–5.0)
BILIRUBIN TOTAL: 0.9 mg/dL (ref 0.3–1.2)
BUN: 12 mg/dL (ref 8–23)
CALCIUM: 8.7 mg/dL — AB (ref 8.9–10.3)
CO2: 31 mmol/L (ref 22–32)
CREATININE: 1.16 mg/dL (ref 0.61–1.24)
Chloride: 96 mmol/L — ABNORMAL LOW (ref 98–111)
GFR calc non Af Amer: >60 mL/min (ref >60)
GLUCOSE: 132 mg/dL — AB (ref 70–99)
Potassium: 4 mmol/L (ref 3.5–5.1)
SODIUM: 132 mmol/L — AB (ref 135–145)
TOTAL PROTEIN: 7 g/dL (ref 6.5–8.1)

## 2018-05-14 LAB — CBC WITH DIFFERENTIAL/PLATELET
ABS IMMATURE GRANULOCYTES: 0.03 10*3/uL (ref 0.00–0.07)
Basophils Absolute: 0 10*3/uL (ref 0.0–0.1)
Basophils Relative: 0 %
EOS PCT: 2 %
Eosinophils Absolute: 0.2 10*3/uL (ref 0.0–0.5)
HEMATOCRIT: 48.5 % (ref 39.0–52.0)
HEMOGLOBIN: 15.5 g/dL (ref 13.0–17.0)
Immature Granulocytes: 0 %
LYMPHS ABS: 1.2 10*3/uL (ref 0.7–4.0)
LYMPHS PCT: 12 %
MCH: 30.7 pg (ref 26.0–34.0)
MCHC: 32 g/dL (ref 30.0–36.0)
MCV: 96 fL (ref 80.0–100.0)
MONO ABS: 0.9 10*3/uL (ref 0.1–1.0)
MONOS PCT: 9 %
NEUTROS ABS: 7.6 10*3/uL (ref 1.7–7.7)
Neutrophils Relative %: 77 %
Platelets: 271 10*3/uL (ref 150–400)
RBC: 5.05 MIL/uL (ref 4.22–5.81)
RDW: 11.7 % (ref 11.5–15.5)
WBC: 9.9 10*3/uL (ref 4.0–10.5)
nRBC: 0 % (ref 0.0–0.2)

## 2018-05-14 MED ORDER — CEFAZOLIN SODIUM-DEXTROSE 2-4 GM/100ML-% IV SOLN
2.0000 g | INTRAVENOUS | Status: AC
  Administered 2018-05-15: 2 g via INTRAVENOUS
  Filled 2018-05-14 (×2): qty 100

## 2018-05-14 NOTE — Progress Notes (Signed)
PCP - Dr. Ines Bloomer Sagardia Cardiologist - denies  Chest x-ray - 11/30/17 EKG - 12/02/17 Stress Test -denies  ECHO - denies Cardiac Cath - denies  Sleep Study - denies  Used an Astronomer for [employer] appointment; Duane Cuevas. Pt states that he is not diabetic and does not currently take his metformin. Last A1C on 03/16/18 was 6.0. Pt does check his CBG at home but very infrequently. Usually ranges from 95-110.   Aspirin Instructions: N/A  Anesthesia review: No  Patient denies shortness of breath, fever, cough and chest pain at PAT appointment   Patient verbalized understanding of instructions that were given to them at the PAT appointment. Patient was also instructed that they will need to review over the PAT instructions again at home before surgery.

## 2018-05-15 ENCOUNTER — Ambulatory Visit (HOSPITAL_COMMUNITY): Payer: Self-pay | Admitting: Anesthesiology

## 2018-05-15 ENCOUNTER — Encounter (HOSPITAL_COMMUNITY)
Admission: RE | Disposition: A | Discharge status: Home / Self Care | Payer: Self-pay | Source: Ambulatory Visit | Attending: Surgery

## 2018-05-15 ENCOUNTER — Ambulatory Visit (HOSPITAL_COMMUNITY)
Admission: RE | Admit: 2018-05-15 | Discharge: 2018-05-15 | Disposition: A | Discharge status: Home / Self Care | Payer: Self-pay | Source: Ambulatory Visit | Attending: Surgery | Admitting: Surgery

## 2018-05-15 ENCOUNTER — Encounter (HOSPITAL_COMMUNITY): Payer: Self-pay | Admitting: Anesthesiology

## 2018-05-15 DIAGNOSIS — K4090 Unilateral inguinal hernia, without obstruction or gangrene, not specified as recurrent: Secondary | ICD-10-CM | POA: Insufficient documentation

## 2018-05-15 DIAGNOSIS — F17200 Nicotine dependence, unspecified, uncomplicated: Secondary | ICD-10-CM | POA: Insufficient documentation

## 2018-05-15 DIAGNOSIS — J449 Chronic obstructive pulmonary disease, unspecified: Secondary | ICD-10-CM | POA: Insufficient documentation

## 2018-05-15 HISTORY — PX: INSERTION OF MESH: SHX5868

## 2018-05-15 HISTORY — PX: INGUINAL HERNIA REPAIR: SHX194

## 2018-05-15 SURGERY — REPAIR, HERNIA, INGUINAL, ADULT
Anesthesia: General | Site: Inguinal | Laterality: Left

## 2018-05-15 MED ORDER — BUPIVACAINE-EPINEPHRINE 0.25% -1:200000 IJ SOLN
INTRAMUSCULAR | Status: DC | PRN
  Administered 2018-05-15: 10 mL

## 2018-05-15 MED ORDER — CELECOXIB 200 MG PO CAPS
400.0000 mg | ORAL_CAPSULE | ORAL | Status: AC
  Administered 2018-05-15: 400 mg via ORAL

## 2018-05-15 MED ORDER — LIDOCAINE 2% (20 MG/ML) 5 ML SYRINGE
INTRAMUSCULAR | Status: DC | PRN
  Administered 2018-05-15: 20 mg via INTRAVENOUS

## 2018-05-15 MED ORDER — SUGAMMADEX SODIUM 200 MG/2ML IV SOLN
INTRAVENOUS | Status: AC
  Filled 2018-05-15: qty 2

## 2018-05-15 MED ORDER — ALBUTEROL SULFATE HFA 108 (90 BASE) MCG/ACT IN AERS
INHALATION_SPRAY | RESPIRATORY_TRACT | Status: AC
  Filled 2018-05-15: qty 6.7

## 2018-05-15 MED ORDER — ROCURONIUM BROMIDE 50 MG/5ML IV SOSY
PREFILLED_SYRINGE | INTRAVENOUS | Status: AC
  Filled 2018-05-15: qty 10

## 2018-05-15 MED ORDER — ALBUTEROL SULFATE HFA 108 (90 BASE) MCG/ACT IN AERS
INHALATION_SPRAY | RESPIRATORY_TRACT | Status: DC | PRN
  Administered 2018-05-15 (×2): 2 via RESPIRATORY_TRACT

## 2018-05-15 MED ORDER — ACETAMINOPHEN 500 MG PO TABS
1000.0000 mg | ORAL_TABLET | ORAL | Status: AC
  Administered 2018-05-15: 1000 mg via ORAL

## 2018-05-15 MED ORDER — FENTANYL CITRATE (PF) 100 MCG/2ML IJ SOLN
25.0000 ug | INTRAMUSCULAR | Status: DC | PRN
  Administered 2018-05-15 (×4): 50 ug via INTRAVENOUS

## 2018-05-15 MED ORDER — OXYCODONE HCL 5 MG PO TABS: 5.0000 mg | ORAL_TABLET | Freq: Four times a day (QID) | ORAL | 0 refills | Status: DC | PRN

## 2018-05-15 MED ORDER — FENTANYL CITRATE (PF) 100 MCG/2ML IJ SOLN
INTRAMUSCULAR | Status: DC | PRN
  Administered 2018-05-15: 100 ug via INTRAVENOUS

## 2018-05-15 MED ORDER — CELECOXIB 200 MG PO CAPS
ORAL_CAPSULE | ORAL | Status: AC
  Filled 2018-05-15: qty 2

## 2018-05-15 MED ORDER — SUGAMMADEX SODIUM 200 MG/2ML IV SOLN
INTRAVENOUS | Status: DC | PRN
  Administered 2018-05-15: 150 mg via INTRAVENOUS

## 2018-05-15 MED ORDER — ONDANSETRON HCL 4 MG/2ML IJ SOLN
INTRAMUSCULAR | Status: DC | PRN
  Administered 2018-05-15: 4 mg via INTRAVENOUS

## 2018-05-15 MED ORDER — FENTANYL CITRATE (PF) 250 MCG/5ML IJ SOLN
INTRAMUSCULAR | Status: AC
  Filled 2018-05-15: qty 5

## 2018-05-15 MED ORDER — LIDOCAINE 2% (20 MG/ML) 5 ML SYRINGE
INTRAMUSCULAR | Status: AC
  Filled 2018-05-15: qty 10

## 2018-05-15 MED ORDER — PROPOFOL 10 MG/ML IV BOLUS
INTRAVENOUS | Status: DC | PRN
  Administered 2018-05-15: 150 mg via INTRAVENOUS

## 2018-05-15 MED ORDER — ONDANSETRON HCL 4 MG/2ML IJ SOLN
INTRAMUSCULAR | Status: AC
  Filled 2018-05-15: qty 4

## 2018-05-15 MED ORDER — FENTANYL CITRATE (PF) 100 MCG/2ML IJ SOLN
INTRAMUSCULAR | Status: AC
  Administered 2018-05-15: 50 ug via INTRAVENOUS
  Filled 2018-05-15: qty 2

## 2018-05-15 MED ORDER — DEXAMETHASONE SODIUM PHOSPHATE 10 MG/ML IJ SOLN
INTRAMUSCULAR | Status: DC | PRN
  Administered 2018-05-15: 10 mg via INTRAVENOUS

## 2018-05-15 MED ORDER — PROPOFOL 10 MG/ML IV BOLUS
INTRAVENOUS | Status: AC
  Filled 2018-05-15: qty 20

## 2018-05-15 MED ORDER — ROCURONIUM BROMIDE 50 MG/5ML IV SOSY
PREFILLED_SYRINGE | INTRAVENOUS | Status: DC | PRN
  Administered 2018-05-15: 40 mg via INTRAVENOUS
  Administered 2018-05-15: 10 mg via INTRAVENOUS

## 2018-05-15 MED ORDER — ROPIVACAINE HCL 5 MG/ML IJ SOLN
INTRAMUSCULAR | Status: DC | PRN
  Administered 2018-05-15: 30 mL via PERINEURAL

## 2018-05-15 MED ORDER — OXYCODONE HCL 5 MG PO TABS
ORAL_TABLET | ORAL | Status: AC
  Filled 2018-05-15: qty 1

## 2018-05-15 MED ORDER — FENTANYL CITRATE (PF) 100 MCG/2ML IJ SOLN
INTRAMUSCULAR | Status: AC
  Filled 2018-05-15: qty 2

## 2018-05-15 MED ORDER — MIDAZOLAM HCL 2 MG/2ML IJ SOLN: 2.0000 mg | Freq: Once | INTRAMUSCULAR | Status: DC

## 2018-05-15 MED ORDER — MIDAZOLAM HCL 2 MG/2ML IJ SOLN
INTRAMUSCULAR | Status: AC
  Filled 2018-05-15: qty 2

## 2018-05-15 MED ORDER — OXYCODONE HCL 5 MG/5ML PO SOLN: 5.0000 mg | Freq: Once | ORAL | Status: AC | PRN

## 2018-05-15 MED ORDER — IBUPROFEN 800 MG PO TABS: 800.0000 mg | ORAL_TABLET | Freq: Three times a day (TID) | ORAL | 0 refills | Status: DC | PRN

## 2018-05-15 MED ORDER — LACTATED RINGERS IV SOLN
INTRAVENOUS | Status: DC | PRN
  Administered 2018-05-15 (×2): via INTRAVENOUS

## 2018-05-15 MED ORDER — CHLORHEXIDINE GLUCONATE CLOTH 2 % EX PADS: 6.0000 | MEDICATED_PAD | Freq: Once | CUTANEOUS | Status: DC

## 2018-05-15 MED ORDER — OXYCODONE HCL 5 MG PO TABS
5.0000 mg | ORAL_TABLET | Freq: Once | ORAL | Status: AC | PRN
  Administered 2018-05-15: 5 mg via ORAL

## 2018-05-15 MED ORDER — GABAPENTIN 300 MG PO CAPS
300.0000 mg | ORAL_CAPSULE | ORAL | Status: AC
  Administered 2018-05-15: 300 mg via ORAL

## 2018-05-15 MED ORDER — ACETAMINOPHEN 500 MG PO TABS
ORAL_TABLET | ORAL | Status: AC
  Filled 2018-05-15: qty 2

## 2018-05-15 MED ORDER — BUPIVACAINE-EPINEPHRINE (PF) 0.25% -1:200000 IJ SOLN
INTRAMUSCULAR | Status: AC
  Filled 2018-05-15: qty 30

## 2018-05-15 MED ORDER — GABAPENTIN 300 MG PO CAPS
ORAL_CAPSULE | ORAL | Status: AC
  Filled 2018-05-15: qty 1

## 2018-05-15 MED ORDER — PHENYLEPHRINE HCL 10 MG/ML IJ SOLN
INTRAMUSCULAR | Status: DC | PRN
  Administered 2018-05-15 (×2): 80 ug via INTRAVENOUS

## 2018-05-15 MED ORDER — EPHEDRINE SULFATE 50 MG/ML IJ SOLN
INTRAMUSCULAR | Status: DC | PRN
  Administered 2018-05-15: 10 mg via INTRAVENOUS
  Administered 2018-05-15 (×2): 5 mg via INTRAVENOUS

## 2018-05-15 MED ORDER — PHENYLEPHRINE 40 MCG/ML (10ML) SYRINGE FOR IV PUSH (FOR BLOOD PRESSURE SUPPORT)
PREFILLED_SYRINGE | INTRAVENOUS | Status: AC
  Filled 2018-05-15: qty 10

## 2018-05-15 MED ORDER — ONDANSETRON HCL 4 MG/2ML IJ SOLN: 4.0000 mg | Freq: Once | INTRAMUSCULAR | Status: DC | PRN

## 2018-05-15 MED ORDER — FENTANYL CITRATE (PF) 100 MCG/2ML IJ SOLN
50.0000 ug | Freq: Once | INTRAMUSCULAR | Status: AC
  Administered 2018-05-15: 50 ug via INTRAVENOUS

## 2018-05-15 MED ORDER — DEXAMETHASONE SODIUM PHOSPHATE 10 MG/ML IJ SOLN
INTRAMUSCULAR | Status: AC
  Filled 2018-05-15: qty 2

## 2018-05-15 SURGICAL SUPPLY — 46 items
BLADE CLIPPER SURG (BLADE) IMPLANT
CANISTER SUCT 3000ML PPV (MISCELLANEOUS) IMPLANT
CHLORAPREP W/TINT 26ML (MISCELLANEOUS) ×2 IMPLANT
COVER SURGICAL LIGHT HANDLE (MISCELLANEOUS) ×2 IMPLANT
COVER WAND RF STERILE (DRAPES) ×2 IMPLANT
DERMABOND ADVANCED (GAUZE/BANDAGES/DRESSINGS) ×1
DERMABOND ADVANCED .7 DNX12 (GAUZE/BANDAGES/DRESSINGS) ×1 IMPLANT
DRAIN PENROSE 1/2X12 LTX STRL (WOUND CARE) ×2 IMPLANT
DRAPE LAPAROTOMY TRNSV 102X78 (DRAPE) ×2 IMPLANT
DRAPE UTILITY XL STRL (DRAPES) ×4 IMPLANT
ELECT CAUTERY BLADE 6.4 (BLADE) ×2 IMPLANT
ELECT REM PT RETURN 9FT ADLT (ELECTROSURGICAL) ×2
ELECTRODE REM PT RTRN 9FT ADLT (ELECTROSURGICAL) ×1 IMPLANT
GAUZE 4X4 16PLY RFD (DISPOSABLE) ×2 IMPLANT
GLOVE BIO SURGEON STRL SZ8 (GLOVE) ×2 IMPLANT
GLOVE BIOGEL PI IND STRL 8 (GLOVE) ×1 IMPLANT
GLOVE BIOGEL PI INDICATOR 8 (GLOVE) ×1
GOWN STRL REUS W/ TWL LRG LVL3 (GOWN DISPOSABLE) ×1 IMPLANT
GOWN STRL REUS W/ TWL XL LVL3 (GOWN DISPOSABLE) ×1 IMPLANT
GOWN STRL REUS W/TWL LRG LVL3 (GOWN DISPOSABLE) ×1
GOWN STRL REUS W/TWL XL LVL3 (GOWN DISPOSABLE) ×1
KIT BASIN OR (CUSTOM PROCEDURE TRAY) ×2 IMPLANT
KIT TURNOVER KIT B (KITS) ×2 IMPLANT
MESH HERNIA SYS ULTRAPRO LRG (Mesh General) ×2 IMPLANT
NEEDLE HYPO 25GX1X1/2 BEV (NEEDLE) ×4 IMPLANT
NS IRRIG 1000ML POUR BTL (IV SOLUTION) ×2 IMPLANT
PACK SURGICAL SETUP 50X90 (CUSTOM PROCEDURE TRAY) ×2 IMPLANT
PAD ARMBOARD 7.5X6 YLW CONV (MISCELLANEOUS) ×2 IMPLANT
PENCIL BUTTON HOLSTER BLD 10FT (ELECTRODE) ×2 IMPLANT
SPONGE LAP 18X18 X RAY DECT (DISPOSABLE) ×2 IMPLANT
SUT MNCRL AB 4-0 PS2 18 (SUTURE) ×2 IMPLANT
SUT NOVA 0 T20/GS 25DT 4464 63 (SUTURE) ×2 IMPLANT
SUT NOVA NAB DX-16 0-1 5-0 T12 (SUTURE) ×2 IMPLANT
SUT SILK 2 0 SH (SUTURE) IMPLANT
SUT VIC AB 0 CT1 27 (SUTURE)
SUT VIC AB 0 CT1 27XBRD ANBCTR (SUTURE) IMPLANT
SUT VIC AB 2-0 SH 27 (SUTURE) ×1
SUT VIC AB 2-0 SH 27X BRD (SUTURE) ×1 IMPLANT
SUT VIC AB 3-0 SH 18 (SUTURE) ×2 IMPLANT
SUT VICRYL AB 3 0 TIES (SUTURE) ×2 IMPLANT
SYR BULB 3OZ (MISCELLANEOUS) IMPLANT
SYR CONTROL 10ML LL (SYRINGE) ×2 IMPLANT
TOWEL OR 17X24 6PK STRL BLUE (TOWEL DISPOSABLE) ×2 IMPLANT
TOWEL OR 17X26 10 PK STRL BLUE (TOWEL DISPOSABLE) ×2 IMPLANT
TUBE CONNECTING 12X1/4 (SUCTIONS) IMPLANT
YANKAUER SUCT BULB TIP NO VENT (SUCTIONS) IMPLANT

## 2018-05-15 NOTE — Transfer of Care (Signed)
Immediate Anesthesia Transfer of Care Note  Patient: Duane Cuevas  Procedure(s) Performed: LEFT INGUINAL HERNIA REPAIR WITH MESH (Left Inguinal) INSERTION OF MESH (Left Inguinal)  Patient Location: PACU  Anesthesia Type:General  Level of Consciousness: awake, alert  and oriented  Airway & Oxygen Therapy: Patient Spontanous Breathing and Patient connected to nasal cannula oxygen  Post-op Assessment: Report given to RN, Post -op Vital signs reviewed and stable and Patient moving all extremities X 4  Post vital signs: Reviewed and stable  Last Vitals:  Vitals Value Taken Time  BP 132/81 05/15/2018 12:27 PM  Temp    Pulse 95 05/15/2018 12:33 PM  Resp 32 05/15/2018 12:33 PM  SpO2 94 % 05/15/2018 12:33 PM  Vitals shown include unvalidated device data.  Last Pain:  Vitals:   05/15/18 1006  TempSrc:   PainSc: 5          Complications: No apparent anesthesia complications

## 2018-05-15 NOTE — Interval H&P Note (Signed)
History and Physical Interval Note:  05/15/2018 10:25 AM  Duane Cuevas  has presented today for surgery, with the diagnosis of LEFT INGUINAL HERNIA  The various methods of treatment have been discussed with the patient and family. After consideration of risks, benefits and other options for treatment, the patient has consented to  Procedure(s): LEFT INGUINAL HERNIA REPAIR WITH MESH (Left) INSERTION OF MESH (Left) as a surgical intervention .  The patient's history has been reviewed, patient examined, no change in status, stable for surgery.  I have reviewed the patient's chart and labs.  Questions were answered to the patient's satisfaction.     [place]

## 2018-05-15 NOTE — Op Note (Signed)
Left Inguinal Hernia, Open, Procedure Note with mesh   Indications: The patient presented with a history of a left, reducible inguinal  hernia.  The risk of hernia repair include bleeding,  Infection,   Recurrence of the hernia,  Mesh use, chronic pain,  Organ injury,  Bowel injury,  Bladder injury,   nerve injury with numbness around the incision,  Death,  and worsening of preexisting  medical problems.  The alternatives to surgery have been discussed as well..  Long term expectations of both operative and non operative treatments have been discussed.   The patient agrees to proceed.  Pre-operative Diagnosis: left reducible inguinal hernia indirect   Post-operative Diagnosis: same  Surgeon: Joyice Faster Sumner Boesch   Assistants: OR staff  Anesthesia: General endotracheal anesthesia, Local anesthesia 0.25.% bupivacaine, with epinephrine and TAP   ASA Class: 2  Procedure Details  The patient was seen again in the Holding Room. The risks, benefits, complications, treatment options, and expected outcomes were discussed with the patient. The possibilities of reaction to medication, pulmonary aspiration, perforation of viscus, bleeding, recurrent infection, the need for additional procedures, and development of a complication requiring transfusion or further operation were discussed with the patient and/or family. There was concurrence with the proposed plan, and informed consent was obtained. The site of surgery was properly noted/marked. The patient was taken to the Operating Room, identified as Duane Cuevas, and the procedure verified as hernia repair. A Time Out was held and the above information confirmed.  The patient was placed in the supine position and underwent induction of anesthesia, the lower abdomen and groin was prepped and draped in the standard fashion, and 0.25% Marcaine with epinephrine was used to anesthetize the skin over the mid-portion of the inguinal canal. A transverse incision  was made. Dissection was carried through the soft tissue to expose the inguinal canal and inguinal ligament along its lower edge. The external oblique fascia was split along the course of its fibers, exposing the inguinal canal. The cord and nerve were looped using a Penrose drain and reflected out of the field. The defect was exposed and a piece of prolene hernia system ultrapro mesh was and placed into the indirect defect and deployed. Interupted 1-0 novafil suture was then used  to repair the defect, with the suture being sewn from the pubic tubercle inferiorly and superiorly along the canal to a level just beyond the internal ring. The mesh was split to allow passage of the cord and nerve into the canal without entrapment. The contents were then returned to canal and the external oblique fashion was then closed in a continuous fashion using 3-0 Vicryl suture taking care not to cause entrapment. Scarpa's layer closed with 3 0 vicryl and 4 0 monocryl used to close the skin.  Dermabond used for dressing.  Instrument, sponge, and needle counts were correct prior to closure and at the conclusion of the case.  Findings: Hernia as above  Estimated Blood Loss: Minimal         Drains: None         Total IV Fluids: per anesthesia record          Specimens: none                Complications: None; patient tolerated the procedure well.         Disposition: PACU - hemodynamically stable.         Condition: stable

## 2018-05-15 NOTE — Discharge Instructions (Signed)
Hernia inguinal en los adultos (Inguinal Hernia, Adult) Una hernia inguinal se produce cuando la grasa o los intestinos empujan a travs de la zona donde las piernas se unen al abdomen inferior (ingle) y forman una protuberancia redondeada (bulto). Esta afeccin se desarrolla con el transcurso del tiempo. Existen tres tipos de hernias inguinales. Estos tipos son los siguientes:  Hernias que se pueden empujar hacia adentro del abdomen (son reducibles).  Hernias que no son reducibles (estn encarceladas).  Hernias que no son reducibles y pierden la irrigacin sangunea (estn estranguladas). Este tipo de hernia requiere ciruga de Freight forwarder. CAUSAS Esta afeccin se produce cuando tiene una zona dbil en los msculos o en los tejidos. Esta zona dbil permite que la hernia la atraviese. Los factores desencadenantes de esta afeccin son los siguientes:  Un esfuerzo repentino de los msculos del abdomen inferior.  Levantar objetos pesados.  Dificultad para defecar. La dificultad para defecar (estreimiento) puede producir una hernia.  Tos. FACTORES DE RIESGO Es ms probable que esta afeccin se manifieste en:  Hombres.  [place].  Personas que: ? Tienen sobrepeso. ? Realizan trabajos que requieren Haematologist de pie durante largos perodos o levantar cargas pesadas. ? D[place] tenido una hernia inguinal previamente. ? Fumar o [employer]. Estos factores pueden causar una tos duradera (crnica). SNTOMAS Los sntomas dependen del tamao de la hernia. Con frecuencia, una pequea hernia inguinal no tiene sntomas. Los sntomas de una hernia ms grande incluyen los siguientes:  Un bulto en la ingle. Este es fcil de ver cuando la persona se encuentra de pie. Podra no ser visible si la persona se encuentra acostada.  Dolor o ardor en la ingle. Esto ocurre especialmente al levantar cargas, realizar esfuerzos o toser.  Un dolor sordo o una sensacin de  presin en la ingle.  Un bulto en el escroto del hombre. Los sntomas de una hernia inguinal estrangulada pueden incluir lo siguiente:  Un bulto en la ingle que es muy doloroso y sensible al tacto.  Un bulto que se torna de color rojo o prpura.  Cristy Hilts, nuseas y vmitos.  Imposibilidad de defecar o eliminar gases. DIAGNSTICO Esta afeccin se diagnostica mediante la historia clnica y un examen fsico. El mdico puede examinar la zona de la ingle y pedirle que tosa. TRATAMIENTO El tratamiento de esta afeccin depende del tamao de la hernia y si tiene sntomas. Si no tiene sntomas, el mdico podr indicarle que controle cuidadosamente la hernia y que concurra a todas las visitas de control. Si la hernia es ms grande o si tiene sntomas, el tratamiento puede incluir la [place]. INSTRUCCIONES PARA EL CUIDADO EN EL HOGAR Estilo de vida  Beba suficiente lquido para mantener la orina clara o de color amarillo plido.  Lleve una dieta con alto contenido de [place]. Coma gran cantidad de frutas, verduras y cereales integrales. Hable con su mdico si tiene dudas.  Evite levantar objetos pesados.  Intente no estar de pie [employer].  No consuma productos que contengan tabaco, incluidos cigarrillos, tabaco de Higher education careers adviser o cigarrillos electrnicos. Si necesita ayuda para dejar de fumar, consulte al mdico.  Mantenga un peso saludable. Instrucciones generales  No trate de volver a introducir la hernia a la fuerza.  Observe si la hernia cambia de color o de tamao. Informe a su mdico si ocurre algn cambio.  Tome los medicamentos de venta libre y los recetados solamente como se lo haya indicado el mdico.  Concurra a todas las visitas  de control como se lo haya indicado el mdico. Esto es importante. SOLICITE ATENCIN MDICA SI:  Jaclynn Guarneri.  Aparecen nuevos sntomas.  Los sntomas empeoran. SOLICITE ATENCIN MDICA DE INMEDIATO SI:  Tiene dolor en la ingle que comienza  repentinamente y [place].  Un bulto en la ingle que crece rpidamente y no desaparece.  Si es un hombre y tiene Clinical cytogeneticist repentino en el escroto o el tamao de este cambia de repente.  Un bulto en la zona de la ingle que se vuelve rojo o prpura y causa dolor al tacto.  Tiene nuseas o vmitos que no desaparecen.  Siente que el corazn late mucho ms rpido que lo normal.  Tiene dificultad para defecar o eliminar gases. Esta informacin no tiene Marine scientist el consejo del mdico. Asegrese de hacerle al mdico cualquier pregunta que tenga. Document Released: 10/15/2012 Document Revised: 10/12/2015 Document Reviewed: 04/30/2014 Elsevier Interactive Patient Education  2018 [place] _______[facility]  UMBILICAL OR INGUINAL HERNIA REPAIR: POST OP INSTRUCTIONS  Always review your discharge instruction sheet given to you by the facility where your surgery was performed. IF YOU HAVE DISABILITY OR FAMILY LEAVE FORMS, YOU MUST BRING THEM TO THE OFFICE FOR PROCESSING.   DO NOT GIVE THEM TO YOUR DOCTOR.  1. A  prescription for pain medication may be given to you upon discharge.  Take your pain medication as prescribed, if needed.  If narcotic pain medicine is not needed, then you may take acetaminophen (Tylenol) or ibuprofen (Advil) as needed. 2. Take your usually prescribed medications unless otherwise directed. If you need a refill on your pain medication, please contact your pharmacy.  They will contact our office to request authorization. Prescriptions will not be filled after 5 pm or on week-ends. 3. You should follow a light diet the first 24 hours after arrival home, such as soup and crackers, etc.  Be sure to include lots of fluids daily.  Resume your normal diet the day after surgery. 4.Most patients will experience some swelling and bruising around the umbilicus or in the groin and scrotum.  Ice packs and reclining will help.  Swelling and  bruising can take several days to resolve.  6. It is common to experience some constipation if taking pain medication after surgery.  Increasing fluid intake and taking a stool softener (such as Colace) will usually help or prevent this problem from occurring.  A mild laxative (Milk of Magnesia or Miralax) should be taken according to package directions if there are no bowel movements after 48 hours. 7. Unless discharge instructions indicate otherwise, you may remove your bandages 24-48 hours after surgery, and you may shower at that time.  You may have steri-strips (small skin tapes) in place directly over the incision.  These strips should be left on the skin for 7-10 days.  If your surgeon used skin glue on the incision, you may shower in 24 hours.  The glue will flake off over the next 2-3 weeks.  Any sutures or staples will be removed at the office during your follow-up visit. 8. ACTIVITIES:  You may resume regular (light) daily activities beginning the next day--such as daily self-care, walking, climbing stairs--gradually increasing activities as tolerated.  You may have sexual intercourse when it is comfortable.  Refrain from any heavy lifting or straining until approved by your doctor.  a.You may drive when you are no longer taking prescription pain medication, you can comfortably wear a seatbelt,  and you can safely maneuver your car and apply brakes. b.RETURN TO WORK:   ___________________4 WEEKS   NO LIFTING ABOVE 20 LBS FOR 4 WEEKS __________________________  9.You should see your doctor in the office for a follow-up appointment approximately 2-3 weeks after your surgery.  Make sure that you call for this appointment within a day or two after you arrive home to insure a convenient appointment time. 10.OTHER INSTRUCTIONS: _________________________    _____________________________________  WHEN TO CALL YOUR DOCTOR: 1. Fever over 101.0 2. Inability to urinate 3. Nausea and/or  vomiting 4. Extreme swelling or bruising 5. Continued bleeding from incision. 6. Increased pain, redness, or drainage from the incision  The clinic staff is available to answer your questions during regular business hours.  Please dont hesitate to call and ask to speak to one of the nurses for clinical concerns.  If you have a medical emergency, go to the nearest emergency room or call 911.  A surgeon from [facility] is always on call at the hospital   [address] [place], [place], [place]  [address] ?  [address], [place], [place]   [address] [phone] ? [phone] ? FAX [phone] Web site: [email or web address]

## 2018-05-15 NOTE — Anesthesia Preprocedure Evaluation (Addendum)
Anesthesia Evaluation  Patient identified by MRN, date of birth, ID band Patient awake    Reviewed: Allergy & Precautions, NPO status , Patient's Chart, lab work & pertinent test results  History of Anesthesia Complications Negative for: history of anesthetic complications  Airway Mallampati: II  TM Distance: >3 FB Neck ROM: Full    Dental no notable dental hx. (+) Edentulous Upper, Edentulous Lower, Dental Advidsory Given   Pulmonary COPD, Current Smoker,    Pulmonary exam normal        Cardiovascular negative cardio ROS Normal cardiovascular exam     Neuro/Psych negative neurological ROS  negative psych ROS   GI/Hepatic negative GI ROS, Hepatic steatosis   Endo/Other  diabetes (pre-DM (A1c 6.0) no meds)  Renal/GU negative Renal ROS  negative genitourinary   Musculoskeletal negative musculoskeletal ROS (+)   Abdominal   Peds  Hematology negative hematology ROS (+)   Anesthesia Other Findings   Reproductive/Obstetrics                           Anesthesia Physical Anesthesia Plan  ASA: II  Anesthesia Plan: General   Post-op Pain Management: GA combined w/ Regional for post-op pain   Induction: Intravenous  PONV Risk Score and Plan: 1 and Ondansetron, Midazolam, Dexamethasone and Treatment may vary due to age or medical condition  Airway Management Planned: LMA  Additional Equipment: None  Intra-op Plan:   Post-operative Plan: Extubation in OR  Informed Consent: I have reviewed the patients History and Physical, chart, labs and discussed the procedure including the risks, benefits and alternatives for the proposed anesthesia with the patient or authorized representative who has indicated his/her understanding and acceptance.   Dental Advisory Given  Plan Discussed with: Anesthesiologist, CRNA and Surgeon  Anesthesia Plan Comments:       Anesthesia Quick Evaluation

## 2018-05-15 NOTE — Anesthesia Procedure Notes (Signed)
Procedure Name: Intubation Date/Time: 05/15/2018 11:07 AM Performed by: Neldon Newport, CRNA Pre-anesthesia Checklist: Timeout performed, Patient being monitored, Suction available, Emergency Drugs available and Patient identified Patient Re-evaluated:Patient Re-evaluated prior to induction Oxygen Delivery Method: Circle system utilized Preoxygenation: Pre-oxygenation with 100% oxygen Induction Type: IV induction Ventilation: Mask ventilation without difficulty and Oral airway inserted - appropriate to patient size Laryngoscope Size: Mac and 4 Grade View: Grade I Tube type: Oral Tube size: 7.5 mm Number of attempts: 1 Placement Confirmation: breath sounds checked- equal and bilateral,  positive ETCO2 and ETT inserted through vocal cords under direct vision Secured at: 23 (lip) cm Tube secured with: Tape Dental Injury: Teeth and Oropharynx as per pre-operative assessment

## 2018-05-15 NOTE — Anesthesia Procedure Notes (Signed)
Anesthesia Regional Block: TAP block   Pre-Anesthetic Checklist: ,, timeout performed, Correct Patient, Correct Site, Correct Laterality, Correct Procedure, Correct Position, site marked, Risks and benefits discussed,  Surgical consent,  Pre-op evaluation,  At surgeon's request and post-op pain management  Laterality: Left  Prep: chloraprep       Needles:  Injection technique: Single-shot  Needle Type: Echogenic Stimulator Needle     Needle Length: 9cm  Needle Gauge: 21     Additional Needles:   Procedures:,,,, ultrasound used (permanent image in chart),,,,  Narrative:  Start time: 05/15/2018 10:25 AM End time: 05/15/2018 10:33 AM Injection made incrementally with aspirations every 5 mL.  Performed by: Personally  Anesthesiologist: Lidia Collum, MD  Additional Notes: Monitors applied. Injection made in 5cc increments. No resistance to injection. Good needle visualization. Patient tolerated procedure well.

## 2018-05-15 NOTE — Anesthesia Postprocedure Evaluation (Deleted)
Anesthesia Post Note  Patient: Duane Cuevas  Procedure(s) Performed: LEFT INGUINAL HERNIA REPAIR WITH MESH (Left Inguinal) INSERTION OF MESH (Left Inguinal)     Patient location during evaluation: PACU Anesthesia Type: General Level of consciousness: awake and alert Pain management: pain level controlled Vital Signs Assessment: post-procedure vital signs reviewed and stable Respiratory status: spontaneous breathing, nonlabored ventilation, respiratory function stable and patient connected to nasal cannula oxygen Cardiovascular status: blood pressure returned to baseline and stable Postop Assessment: no apparent nausea or vomiting Anesthetic complications: no    Last Vitals:  Vitals:   05/15/18 1241 05/15/18 1357  BP: 122/79 128/86  Pulse: 82 100  Resp: 16   Temp:    SpO2: 95% (!) 88%    Last Pain:  Vitals:   05/15/18 1227  TempSrc:   PainSc: Tyler Deis

## 2018-05-16 ENCOUNTER — Encounter (HOSPITAL_COMMUNITY): Payer: Self-pay | Admitting: Surgery

## 2018-05-17 NOTE — Anesthesia Postprocedure Evaluation (Signed)
Anesthesia Post Note  Patient: Duane Cuevas  Procedure(s) Performed: LEFT INGUINAL HERNIA REPAIR WITH MESH (Left Inguinal) INSERTION OF MESH (Left Inguinal)     Patient location during evaluation: PACU Anesthesia Type: General Level of consciousness: awake and alert Pain management: pain level controlled Vital Signs Assessment: post-procedure vital signs reviewed and stable Respiratory status: spontaneous breathing, nonlabored ventilation, respiratory function stable and patient connected to nasal cannula oxygen Cardiovascular status: blood pressure returned to baseline and stable Postop Assessment: no apparent nausea or vomiting Anesthetic complications: no    Last Vitals:  Vitals:   05/15/18 1241 05/15/18 1357  BP: 122/79 128/86  Pulse: 82 100  Resp: 16   Temp:    SpO2: 95% (!) 88%    Last Pain:  Vitals:   05/15/18 1227  TempSrc:   PainSc: Tyler Deis

## 2018-06-19 ENCOUNTER — Ambulatory Visit: Payer: Self-pay | Admitting: Emergency Medicine

## 2018-06-30 ENCOUNTER — Observation Stay (HOSPITAL_COMMUNITY)
Admission: EM | Admit: 2018-06-30 | Discharge: 2018-07-02 | Disposition: A | Discharge status: Home / Self Care | Payer: Self-pay | Attending: Internal Medicine | Admitting: Internal Medicine

## 2018-06-30 ENCOUNTER — Emergency Department (HOSPITAL_COMMUNITY): Payer: Self-pay

## 2018-06-30 ENCOUNTER — Encounter (HOSPITAL_COMMUNITY): Payer: Self-pay

## 2018-06-30 ENCOUNTER — Other Ambulatory Visit: Payer: Self-pay

## 2018-06-30 DIAGNOSIS — Z791 Long term (current) use of non-steroidal anti-inflammatories (NSAID): Secondary | ICD-10-CM | POA: Insufficient documentation

## 2018-06-30 DIAGNOSIS — F17210 Nicotine dependence, cigarettes, uncomplicated: Secondary | ICD-10-CM | POA: Insufficient documentation

## 2018-06-30 DIAGNOSIS — J9601 Acute respiratory failure with hypoxia: Secondary | ICD-10-CM | POA: Insufficient documentation

## 2018-06-30 DIAGNOSIS — Z8639 Personal history of other endocrine, nutritional and metabolic disease: Secondary | ICD-10-CM

## 2018-06-30 DIAGNOSIS — Z7951 Long term (current) use of inhaled steroids: Secondary | ICD-10-CM | POA: Insufficient documentation

## 2018-06-30 DIAGNOSIS — Z7984 Long term (current) use of oral hypoglycemic drugs: Secondary | ICD-10-CM | POA: Insufficient documentation

## 2018-06-30 DIAGNOSIS — Z720 Tobacco use: Secondary | ICD-10-CM

## 2018-06-30 DIAGNOSIS — E119 Type 2 diabetes mellitus without complications: Secondary | ICD-10-CM | POA: Insufficient documentation

## 2018-06-30 DIAGNOSIS — R000 Tachycardia, unspecified: Secondary | ICD-10-CM | POA: Insufficient documentation

## 2018-06-30 DIAGNOSIS — Z23 Encounter for immunization: Secondary | ICD-10-CM | POA: Insufficient documentation

## 2018-06-30 DIAGNOSIS — J441 Chronic obstructive pulmonary disease with (acute) exacerbation: Principal | ICD-10-CM | POA: Insufficient documentation

## 2018-06-30 LAB — CBC WITH DIFFERENTIAL/PLATELET
Abs Immature Granulocytes: 0.02 10*3/uL (ref 0.00–0.07)
Basophils Absolute: 0 10*3/uL (ref 0.0–0.1)
Basophils Relative: 1 %
Eosinophils Absolute: 0.3 10*3/uL (ref 0.0–0.5)
Eosinophils Relative: 5 %
HCT: 45.1 % (ref 39.0–52.0)
Hemoglobin: 15.2 g/dL (ref 13.0–17.0)
Immature Granulocytes: 0 %
Lymphocytes Relative: 16 %
Lymphs Abs: 0.9 10*3/uL (ref 0.7–4.0)
MCH: 31.9 pg (ref 26.0–34.0)
MCHC: 33.7 g/dL (ref 30.0–36.0)
MCV: 94.7 fL (ref 80.0–100.0)
Monocytes Absolute: 0.8 10*3/uL (ref 0.1–1.0)
Monocytes Relative: 16 %
Neutro Abs: 3.3 10*3/uL (ref 1.7–7.7)
Neutrophils Relative %: 62 %
Platelets: 220 10*3/uL (ref 150–400)
RBC: 4.76 MIL/uL (ref 4.22–5.81)
RDW: 12.2 % (ref 11.5–15.5)
WBC: 5.3 10*3/uL (ref 4.0–10.5)
nRBC: 0 % (ref 0.0–0.2)

## 2018-06-30 LAB — BASIC METABOLIC PANEL
Anion gap: 10 (ref 5–15)
BUN: 11 mg/dL (ref 8–23)
CO2: 25 mmol/L (ref 22–32)
Calcium: 8.8 mg/dL — ABNORMAL LOW (ref 8.9–10.3)
Chloride: 95 mmol/L — ABNORMAL LOW (ref 98–111)
Creatinine, Ser: 0.9 mg/dL (ref 0.61–1.24)
GFR calc Af Amer: >60 mL/min (ref >60)
GFR calc non Af Amer: >60 mL/min (ref >60)
Glucose, Bld: 139 mg/dL — ABNORMAL HIGH (ref 70–99)
Potassium: 4.2 mmol/L (ref 3.5–5.1)
Sodium: 130 mmol/L — ABNORMAL LOW (ref 135–145)

## 2018-06-30 LAB — GLUCOSE, CAPILLARY: Glucose-Capillary: 170 mg/dL — ABNORMAL HIGH (ref 70–99)

## 2018-06-30 MED ORDER — ACETAMINOPHEN 325 MG PO TABS
650.0000 mg | ORAL_TABLET | Freq: Four times a day (QID) | ORAL | Status: DC | PRN
  Administered 2018-07-01 (×2): 650 mg via ORAL
  Filled 2018-06-30 (×2): qty 2

## 2018-06-30 MED ORDER — ALBUTEROL (5 MG/ML) CONTINUOUS INHALATION SOLN
15.0000 mg/h | INHALATION_SOLUTION | RESPIRATORY_TRACT | Status: DC
  Administered 2018-06-30: 15 mg/h via RESPIRATORY_TRACT
  Filled 2018-06-30: qty 20

## 2018-06-30 MED ORDER — IPRATROPIUM-ALBUTEROL 0.5-2.5 (3) MG/3ML IN SOLN
3.0000 mL | Freq: Four times a day (QID) | RESPIRATORY_TRACT | Status: DC
  Administered 2018-07-01 – 2018-07-02 (×6): 3 mL via RESPIRATORY_TRACT
  Filled 2018-06-30 (×6): qty 3

## 2018-06-30 MED ORDER — INSULIN ASPART 100 UNIT/ML ~~LOC~~ SOLN: 0.0000 [IU] | Freq: Three times a day (TID) | SUBCUTANEOUS | Status: DC

## 2018-06-30 MED ORDER — SODIUM CHLORIDE 0.9% FLUSH
3.0000 mL | Freq: Two times a day (BID) | INTRAVENOUS | Status: DC
  Administered 2018-07-01 – 2018-07-02 (×4): 3 mL via INTRAVENOUS

## 2018-06-30 MED ORDER — ACETAMINOPHEN 650 MG RE SUPP: 650.0000 mg | Freq: Four times a day (QID) | RECTAL | Status: DC | PRN

## 2018-06-30 MED ORDER — METHYLPREDNISOLONE SODIUM SUCC 125 MG IJ SOLR
60.0000 mg | Freq: Two times a day (BID) | INTRAMUSCULAR | Status: DC
  Administered 2018-06-30 – 2018-07-01 (×2): 60 mg via INTRAVENOUS
  Filled 2018-06-30 (×2): qty 2

## 2018-06-30 MED ORDER — NICOTINE 21 MG/24HR TD PT24
21.0000 mg | MEDICATED_PATCH | Freq: Every day | TRANSDERMAL | Status: DC
  Administered 2018-06-30 – 2018-07-02 (×3): 21 mg via TRANSDERMAL
  Filled 2018-06-30 (×3): qty 1

## 2018-06-30 MED ORDER — ONDANSETRON HCL 4 MG PO TABS: 4.0000 mg | ORAL_TABLET | Freq: Four times a day (QID) | ORAL | Status: DC | PRN

## 2018-06-30 MED ORDER — INSULIN ASPART 100 UNIT/ML ~~LOC~~ SOLN
0.0000 [IU] | SUBCUTANEOUS | Status: DC
  Administered 2018-06-30: 1 [IU] via SUBCUTANEOUS
  Administered 2018-07-01: 5 [IU] via SUBCUTANEOUS
  Administered 2018-07-01: 3 [IU] via SUBCUTANEOUS
  Administered 2018-07-01 (×2): 2 [IU] via SUBCUTANEOUS
  Administered 2018-07-01 – 2018-07-02 (×2): 1 [IU] via SUBCUTANEOUS
  Administered 2018-07-02 (×2): 3 [IU] via SUBCUTANEOUS
  Administered 2018-07-02: 5 [IU] via SUBCUTANEOUS

## 2018-06-30 MED ORDER — ALBUTEROL SULFATE (2.5 MG/3ML) 0.083% IN NEBU
2.5000 mg | INHALATION_SOLUTION | RESPIRATORY_TRACT | Status: DC | PRN
  Filled 2018-06-30: qty 3

## 2018-06-30 MED ORDER — ENOXAPARIN SODIUM 40 MG/0.4ML ~~LOC~~ SOLN
40.0000 mg | Freq: Every day | SUBCUTANEOUS | Status: DC
  Administered 2018-06-30 – 2018-07-01 (×2): 40 mg via SUBCUTANEOUS
  Filled 2018-06-30 (×2): qty 0.4

## 2018-06-30 MED ORDER — ONDANSETRON HCL 4 MG/2ML IJ SOLN: 4.0000 mg | Freq: Four times a day (QID) | INTRAMUSCULAR | Status: DC | PRN

## 2018-06-30 MED ORDER — ONDANSETRON HCL 4 MG/2ML IJ SOLN
4.0000 mg | Freq: Once | INTRAMUSCULAR | Status: AC
  Administered 2018-06-30: 4 mg via INTRAVENOUS
  Filled 2018-06-30: qty 2

## 2018-06-30 MED ORDER — ALBUTEROL SULFATE (2.5 MG/3ML) 0.083% IN NEBU: 5.0000 mg | INHALATION_SOLUTION | Freq: Once | RESPIRATORY_TRACT | Status: DC

## 2018-06-30 NOTE — ED Notes (Signed)
ED TO INPATIENT HANDOFF REPORT  Name/Age/Gender Duane Cuevas 62 y.o. male  Code Status    Code Status Orders  (From admission, onward)         Start     Ordered   06/30/18 2008  Full code  Continuous     06/30/18 2010        Code Status History    Date Active Date Inactive Code Status Order ID Comments User Context   09/23/2015 1149 09/25/2015 1624 Full Code [id number]  RamaVenetia Maxon, MD Inpatient   09/20/2015 0111 09/21/2015 1903 Full Code [id number]  Edwin Dada, MD Inpatient   10/14/2014 2203 10/15/2014 1728 Full Code [id number]  Allyne Gee, MD ED      Home/SNF/Other Given to floor  Chief Complaint Shortness of Breath  Level of Care/Admitting Diagnosis ED Disposition    ED Disposition Condition [place]: [facility] [100102]  Level of Care: Stepdown [14]  Admit to SDU based on following criteria: Respiratory Distress:  Frequent assessment and/or intervention to maintain adequate ventilation/respiration, pulmonary toilet, and respiratory treatment.  Diagnosis: Acute exacerbation of chronic obstructive pulmonary disease [facility]) [[id number]]  Admitting Physician: Lenore Cordia [[id number]]  Attending Physician: Lenore Cordia [[id number]]  PT Class (Do Not Modify): Observation [104]  PT Acc Code (Do Not Modify): Observation [10022]       Medical History Past Medical History:  Diagnosis Date  . Diabetes mellitus ([place]) 10/2014   pt denies being diabetic.   . Emphysema of lung ([place])   . Emphysema/COPD ([place]) 10/2014  . Hepatic steatosis   . Thrombocytopenia ([place]) 09/2015   platelets in 120s.     Allergies No Known Allergies  IV Location/Drains/Wounds Patient Lines/Drains/Airways Status   Active Line/Drains/Airways    Name:   Placement date:   Placement time:   Site:   Days:   Peripheral IV 06/30/18 Right Hand   06/30/18    1828    Hand   less than 1   Incision (Closed) 05/15/18 Groin Left   05/15/18    1134      46          Labs/Imaging Results for orders placed or performed during the hospital encounter of 06/30/18 (from the past 48 hour(s))  CBC with Differential     Status: None   Collection Time: 06/30/18  6:40 PM  Result Value Ref Range   WBC 5.3 4.0 - 10.5 K/uL   RBC 4.76 4.22 - 5.81 MIL/uL   Hemoglobin 15.2 13.0 - 17.0 g/dL   HCT 45.1 39.0 - 52.0 %   MCV 94.7 80.0 - 100.0 fL   MCH 31.9 26.0 - 34.0 pg   MCHC 33.7 30.0 - 36.0 g/dL   RDW 12.2 11.5 - 15.5 %   Platelets 220 150 - 400 K/uL   nRBC 0.0 0.0 - 0.2 %   Neutrophils Relative % 62 %   Neutro Abs 3.3 1.7 - 7.7 K/uL   Lymphocytes Relative 16 %   Lymphs Abs 0.9 0.7 - 4.0 K/uL   Monocytes Relative 16 %   Monocytes Absolute 0.8 0.1 - 1.0 K/uL   Eosinophils Relative 5 %   Eosinophils Absolute 0.3 0.0 - 0.5 K/uL   Basophils Relative 1 %   Basophils Absolute 0.0 0.0 - 0.1 K/uL   Immature Granulocytes 0 %   Abs Immature Granulocytes 0.02 0.00 - 0.07 K/uL    Comment: Performed at [facility],  [place] [address], [place], [place] [address]  Basic metabolic panel     Status: Abnormal   Collection Time: 06/30/18  6:40 PM  Result Value Ref Range   Sodium 130 (L) 135 - 145 mmol/L   Potassium 4.2 3.5 - 5.1 mmol/L   Chloride 95 (L) 98 - 111 mmol/L   CO2 25 22 - 32 mmol/L   Glucose, Bld 139 (H) 70 - 99 mg/dL   BUN 11 8 - 23 mg/dL   Creatinine, Ser 0.90 0.61 - 1.24 mg/dL   Calcium 8.8 (L) 8.9 - 10.3 mg/dL   GFR calc non Af Amer >60 >60 mL/min   GFR calc Af Amer >60 >60 mL/min   Anion gap 10 5 - 15    Comment: Performed at [facility], [place] [address] [place], [place] [address]  Blood gas, venous     Status: Abnormal   Collection Time: 06/30/18  6:50 PM  Result Value Ref Range   FIO2 40.00    Delivery systems BILEVEL POSITIVE AIRWAY PRESSURE    Mode BILEVEL POSITIVE AIRWAY PRESSURE    pH, Ven 7.407 7.250 - 7.430   pCO2, Ven 44.0 44.0 - 60.0 mmHg   pO2, Ven 65.0 (H) 32.0 - 45.0 mmHg    Bicarbonate 27.1 20.0 - 28.0 mmol/L   Acid-Base Excess 2.5 (H) 0.0 - 2.0 mmol/L   O2 Saturation 91.9 %   Patient temperature 98.6    Collection site VEIN    Drawn by COLLECTED BY NURSE    Sample type VENOUS     Comment: Performed at [facility] [address], [place], [place] [address]   Dg Chest Portable 1 View  Result Date: 06/30/2018 CLINICAL DATA:  Pt is arriving for a COPD exacerbation. Pt has had trouble breathing for past three days. Wheezing. H/o Diabetes. Smoker. EXAM: PORTABLE CHEST 1 VIEW COMPARISON:  11/30/2017 FINDINGS: Lungs are hyperinflated. There are no focal consolidations or pleural effusions. Heart size is normal. No pulmonary edema. IMPRESSION: 1. Hyperinflation. 2. No focal acute pulmonary abnormality. Electronically Signed   By: [name] M.D.   On: 06/30/2018 19:25    Pending Labs Unresulted Labs (From admission, onward)    Start     Ordered   07/07/18 0500  Creatinine, serum  (enoxaparin (LOVENOX)    CrCl >/= 30 ml/min)  Weekly,   R    Comments:  while on enoxaparin therapy    06/30/18 2010   07/01/18 6203  Basic metabolic panel  Tomorrow morning,   R     06/30/18 2010   07/01/18 0500  CBC  Tomorrow morning,   R     06/30/18 2010   06/30/18 2009  HIV antibody (Routine Testing)  Add-on,   R     06/30/18 2010   06/30/18 2009  Hemoglobin A1c  Add-on,   R     06/30/18 2010          Vitals/Pain Today's Vitals   06/30/18 1835 06/30/18 1905 06/30/18 2000 06/30/18 2004  BP: (!) 140/94 (!) 140/94 124/78 127/74  Pulse: (!) 106 94 97 95  Resp: (!) 28 12 18 17   Temp: 98 F (36.7 C)     TempSrc: Oral     SpO2: 97% 99% 97% 98%  Weight:      Height:      PainSc: 7        Isolation Precautions No active isolations  Medications Medications  albuterol (PROVENTIL) (2.5 MG/3ML) 0.083%  nebulizer solution 5 mg (5 mg Nebulization Not Given 06/30/18 1843)  albuterol (PROVENTIL,VENTOLIN) solution continuous neb (0 mg/hr Nebulization  Stopped 06/30/18 2010)  enoxaparin (LOVENOX) injection 40 mg (has no administration in time range)  sodium chloride flush (NS) 0.9 % injection 3 mL (has no administration in time range)  acetaminophen (TYLENOL) tablet 650 mg (has no administration in time range)    Or  acetaminophen (TYLENOL) suppository 650 mg (has no administration in time range)  ondansetron (ZOFRAN) tablet 4 mg (has no administration in time range)    Or  ondansetron (ZOFRAN) injection 4 mg (has no administration in time range)  albuterol (PROVENTIL) (2.5 MG/3ML) 0.083% nebulizer solution 2.5 mg (has no administration in time range)  ipratropium-albuterol (DUONEB) 0.5-2.5 (3) MG/3ML nebulizer solution 3 mL (3 mLs Nebulization Not Given 06/30/18 2035)  methylPREDNISolone sodium succinate (SOLU-MEDROL) 125 mg/2 mL injection 60 mg (has no administration in time range)  nicotine (NICODERM CQ - dosed in mg/24 hours) patch 21 mg (has no administration in time range)  insulin aspart (novoLOG) injection 0-9 Units (has no administration in time range)  ondansetron (ZOFRAN) injection 4 mg (4 mg Intravenous Given 06/30/18 1837)    Mobility walks

## 2018-06-30 NOTE — H&P (Signed)
History and Physical    Kardell Virgil GUR:[id number] DOB: 1956-01-13 DOA: 06/30/2018  PCP: [name], MD  Patient coming from: Home via EMS  I have personally briefly reviewed patient's old medical records in [place]  Chief Complaint: Shortness of breath  HPI: Duane Cuevas is a 62 y.o. male with medical history significant for COPD, diet-controlled diabetes, tobacco use who presents with worsening shortness of breath over the last 3 days.  He has an albuterol inhaler which she has been using 8-10 times per day every day.  He says he ran out this morning and his breathing significantly worsened.  He has a chronic cough with production of white sputum which is unchanged.  He has chest wall pain worsened with coughing.  He denies any fevers, chills, diaphoresis, abdominal pain.  He was prescribed Symbicort but was unable to afford it.  EMS were called and he received 10 mg of albuterol, 1 mg of Atrovent, and 125 mg of Solu-Medrol on route to the ED.  Per EDP he arrived on nonrebreather was in obvious distress with tripoding.  ED Course:  Initial vitals showed BP 140/94, pulse 106, RR 28, temp 98 Fahrenheit, SPO2 97% on aerosol mask. VBG showed pH 7.407, PCO2 44.0, PO2 65.0 while on BiPAP with FiO2 40%. Chemistry panel was notable for sodium 130, serum glucose 139.  CBC was within normal limits.  Chest x-ray showed hyperinflated lung fields without acute focal consolidation, infiltrate, or effusion.  Patient was placed on BiPAP with improvement in respiratory status.  Hospitalist service was consulted to admit for further management of COPD exacerbation.  Review of Systems: As per HPI otherwise 10 point review of systems negative.    Past Medical History:  Diagnosis Date  . Diabetes mellitus ([place]) 10/2014   pt denies being diabetic.   . Emphysema of lung ([place])   . Emphysema/COPD ([place]) 10/2014  . Hepatic steatosis   . Thrombocytopenia ([place]) 09/2015   platelets  in 120s.     Past Surgical History:  Procedure Laterality Date  . ESOPHAGOGASTRODUODENOSCOPY N/A 09/23/2015   Procedure: ESOPHAGOGASTRODUODENOSCOPY (EGD);  Surgeon: Ladene Artist, MD;  Location: [facility];  Service: Endoscopy;  Laterality: N/A;  . FOOT SURGERY    . INGUINAL HERNIA REPAIR Left 05/15/2018   Procedure: LEFT INGUINAL HERNIA REPAIR WITH MESH;  Surgeon: Erroll Luna, MD;  Location: [facility];  Service: General;  Laterality: Left;  . INSERTION OF MESH Left 05/15/2018   Procedure: INSERTION OF MESH;  Surgeon: Erroll Luna, MD;  Location: [facility];  Service: General;  Laterality: Left;  . UPPER GASTROINTESTINAL ENDOSCOPY       reports that he has been smoking cigarettes. He has been smoking about 1.00 pack per day. He has never used smokeless tobacco. He reports current alcohol use of about 6.0 standard drinks of alcohol per week. He reports that he does not use drugs.  No Known Allergies  Family History  Problem Relation Age of Onset  . Emphysema Mother   . Diabetes Mother   . Emphysema Father   . Cancer Sister        Stomach cancer  . Diabetes Sister   . Stomach cancer Sister   . Diabetes Brother   . Colon cancer Neg Hx   . Colon polyps Neg Hx   . Esophageal cancer Neg Hx   . Rectal cancer Neg Hx      Prior to Admission medications   Medication Sig Start Date End Date Taking?  Authorizing Provider  albuterol (PROVENTIL HFA;VENTOLIN HFA) 108 (90 Base) MCG/ACT inhaler Inhale 2 puffs into the lungs every 6 (six) hours as needed for wheezing or shortness of breath.   Yes [provider]  albuterol (PROVENTIL) (2.5 MG/3ML) 0.083% nebulizer solution Take 6 mLs (5 mg total) by nebulization every 6 (six) hours as needed for wheezing or shortness of breath. 03/03/15  Yes Etta Quill, NP  budesonide-formoterol [facility]) 80-4.5 MCG/ACT inhaler Inhale 2 puffs into the lungs 2 (two) times daily. Patient not taking: Reported on 06/30/2018 03/16/18   [name], MD  ibuprofen (ADVIL,MOTRIN) 800 MG tablet Take 1 tablet (800 mg total) by mouth every 8 (eight) hours as needed. Patient not taking: Reported on 06/30/2018 05/15/18   Erroll Luna, MD  metFORMIN (GLUCOPHAGE) 500 MG tablet Take 1 tablet (500 mg total) by mouth 2 (two) times daily with a meal. Patient not taking: Reported on 11/30/2017 09/21/15   Elgergawy, Silver Huguenin, MD  oxyCODONE (OXY IR/ROXICODONE) 5 MG immediate release tablet Take 1 tablet (5 mg total) by mouth every 6 (six) hours as needed for severe pain. Patient not taking: Reported on 06/30/2018 05/15/18   Erroll Luna, MD  sildenafil (VIAGRA) 100 MG tablet Take 0.5-1 tablets (50-100 mg total) by mouth daily as needed for erectile dysfunction. Patient not taking: Reported on 06/30/2018 03/16/18   [name], MD    Physical Exam: Vitals:   06/30/18 1835 06/30/18 1905 06/30/18 2000 06/30/18 2004  BP: (!) 140/94 (!) 140/94 124/78 127/74  Pulse: (!) 106 94 97 95  Resp: (!) 28 12 18 17   Temp: 98 F (36.7 C)     TempSrc: Oral     SpO2: 97% 99% 97% 98%  Weight:      Height:        Constitutional: Sitting up in bed wearing BiPAP, calm, appears comfortable Eyes: PERRL, lids and conjunctivae normal Neck: normal Respiratory: Wearing BiPAP, distant breath sounds bilaterally, no active wheezing, no crackles. Normal respiratory effort. No accessory muscle use.  Cardiovascular: Regular rate and rhythm, no murmurs / rubs / gallops. No extremity edema.  Abdomen: no tenderness, no masses palpated. No hepatosplenomegaly. Bowel sounds positive.  Musculoskeletal: no clubbing / cyanosis. No joint deformity upper and lower extremities. Good ROM, no contractures. Normal muscle tone.  Skin: no rashes, lesions, ulcers. No induration Neurologic: CN 2-12 grossly intact. Sensation intact. Strength 5/5 in all 4.  Psychiatric: Normal judgment and insight. Alert and oriented x 3. Normal mood.    Labs on Admission: I have personally  reviewed following labs and imaging studies  CBC: Recent Labs  Lab 06/30/18 1840  WBC 5.3  NEUTROABS 3.3  HGB 15.2  HCT 45.1  MCV 94.7  PLT 734   Basic Metabolic Panel: Recent Labs  Lab 06/30/18 1840  NA 130*  K 4.2  CL 95*  CO2 25  GLUCOSE 139*  BUN 11  CREATININE 0.90  CALCIUM 8.8*   GFR: Estimated Creatinine Clearance: 79.2 mL/min (by C-G formula based on SCr of 0.9 mg/dL). Liver Function Tests: No results for input(s): AST, ALT, ALKPHOS, BILITOT, PROT, ALBUMIN in the last 168 hours. No results for input(s): LIPASE, AMYLASE in the last 168 hours. No results for input(s): AMMONIA in the last 168 hours. Coagulation Profile: No results for input(s): INR, PROTIME in the last 168 hours. Cardiac Enzymes: No results for input(s): CKTOTAL, CKMB, CKMBINDEX, TROPONINI in the last 168 hours. BNP (last 3 results) No results for input(s): PROBNP in the  last 8760 hours. HbA1C: No results for input(s): HGBA1C in the last 72 hours. CBG: No results for input(s): GLUCAP in the last 168 hours. Lipid Profile: No results for input(s): CHOL, HDL, LDLCALC, TRIG, CHOLHDL, LDLDIRECT in the last 72 hours. Thyroid Function Tests: No results for input(s): TSH, T4TOTAL, FREET4, T3FREE, THYROIDAB in the last 72 hours. Anemia Panel: No results for input(s): VITAMINB12, FOLATE, FERRITIN, TIBC, IRON, RETICCTPCT in the last 72 hours. Urine analysis:    Component Value Date/Time   COLORURINE YELLOW 09/23/2015 0616   APPEARANCEUR CLEAR 09/23/2015 0616   LABSPEC 1.019 09/23/2015 0616   PHURINE 7.0 09/23/2015 0616   GLUCOSEU NEGATIVE 09/23/2015 0616   HGBUR TRACE (A) 09/23/2015 0616   BILIRUBINUR NEGATIVE 09/23/2015 0616   KETONESUR NEGATIVE 09/23/2015 0616   PROTEINUR 100 (A) 09/23/2015 0616   UROBILINOGEN 0.2 10/14/2014 1712   NITRITE NEGATIVE 09/23/2015 0616   LEUKOCYTESUR NEGATIVE 09/23/2015 0616    Radiological Exams on Admission: Dg Chest Portable 1 View  Result Date:  06/30/2018 CLINICAL DATA:  Pt is arriving for a COPD exacerbation. Pt has had trouble breathing for past three days. Wheezing. H/o Diabetes. Smoker. EXAM: PORTABLE CHEST 1 VIEW COMPARISON:  11/30/2017 FINDINGS: Lungs are hyperinflated. There are no focal consolidations or pleural effusions. Heart size is normal. No pulmonary edema. IMPRESSION: 1. Hyperinflation. 2. No focal acute pulmonary abnormality. Electronically Signed   By: [name] M.D.   On: 06/30/2018 19:25    EKG: Independently reviewed.  Sinus tachycardia  Assessment/Plan Principal Problem:   Acute exacerbation of chronic obstructive pulmonary disease (HCC) Active Problems:   Tobacco use   History of diet-controlled diabetes   Duane Cuevas is a 62 y.o. male with medical history significant for COPD, diet-controlled diabetes, tobacco use who presents with worsening shortness of breath over the last 3 days who is admitted with acute exacerbation of COPD.  Acute exacerbation of COPD: Respiratory status improving with BiPAP.  He only has an albuterol rescue inhaler at home which he uses 8-10 times per day, he was unable to afford prescribed Symbicort. -Admit to stepdown unit, continue BiPAP and wean off as able -Scheduled duo nebs, PRN albuterol nebs -IV Solu-Medrol 60 mg q12h, transition to orals when weaned off BiPAP -Consult care management for medication needs  Tobacco use: He is a current smoker up to 1 pack/day. -Smoking cessation counseling provided -Nicotine patch  History of diet-controlled diabetes: He is not on medications for diabetes.  Last A1c was 6.0 on 03/16/2018.  Was 11.1% on 10/15/2014. -Recheck A1c -SSI while on steroids  DVT prophylaxis: Lovenox Code Status: Full code Family Communication: Discussed with patient, wife, and daughter at bedside Disposition Plan: Pending clinical progress and improvement in respiratory status Consults called: None Admission status: Observation   Zada Finders  MD [facility] Pager [phone]  If 7PM-7AM, please contact night-coverage [email or web address] Password [facility]  06/30/2018, 8:18 PM

## 2018-06-30 NOTE — ED Triage Notes (Signed)
Per EMS, Pt is arrivng for a COPD exacerbation. Pt has had trouble breathing for past three days. Wheezing in all fields. 10 mg of albuterol, 125 of solu-Medrol.

## 2018-06-30 NOTE — Progress Notes (Signed)
Received patient from ED with report noted.  BBS decreased with exp. Wheezes noted to lower lobes.  Pt. Awake and alert.  Bipap setting verified, machine plugged into red outlet.  No distress noted.

## 2018-06-30 NOTE — ED Provider Notes (Signed)
[place] DEPT Provider Note   CSN: [id number] Arrival date & time: 06/30/18  1821     History   Chief Complaint Chief Complaint  Patient presents with  . Shortness of Breath    HPI Duane Cuevas is a 62 y.o. male.  HPI   62yM with worsening shortness of breath for the past 3 days.  History of COPD.  Not on home oxygen.  Nonproductive cough.  Chest tightness.  No fevers.  No unusual swelling.  Received 10mg  of albuterol, 1 mg of Atrovent and 125 mg of Solu-Medrol by EMS prior to arrival.  He arrived on nonrebreather. In obvious distress.   Past Medical History:  Diagnosis Date  . Diabetes mellitus ([place]) 10/2014   pt denies being diabetic.   . Emphysema of lung ([place])   . Emphysema/COPD ([place]) 10/2014  . Hepatic steatosis   . Thrombocytopenia ([place]) 09/2015   platelets in 120s.     Patient Active Problem List   Diagnosis Date Noted  . Abdominal pain   . Gastritis 09/23/2015  . Diabetes mellitus ([place]) 09/23/2015  . Hypokalemia 09/23/2015  . CAP (community acquired pneumonia) 09/19/2015  . COPD (chronic obstructive pulmonary disease) ([place]) 10/14/2014    Past Surgical History:  Procedure Laterality Date  . ESOPHAGOGASTRODUODENOSCOPY N/A 09/23/2015   Procedure: ESOPHAGOGASTRODUODENOSCOPY (EGD);  Surgeon: Ladene Artist, MD;  Location: [facility];  Service: Endoscopy;  Laterality: N/A;  . FOOT SURGERY    . INGUINAL HERNIA REPAIR Left 05/15/2018   Procedure: LEFT INGUINAL HERNIA REPAIR WITH MESH;  Surgeon: Erroll Luna, MD;  Location: [facility];  Service: General;  Laterality: Left;  . INSERTION OF MESH Left 05/15/2018   Procedure: INSERTION OF MESH;  Surgeon: Erroll Luna, MD;  Location: [facility];  Service: General;  Laterality: Left;  . UPPER GASTROINTESTINAL ENDOSCOPY          Home Medications    Prior to Admission medications   Medication Sig Start Date End Date Taking? Authorizing Provider  albuterol (PROVENTIL HFA;VENTOLIN  HFA) 108 (90 Base) MCG/ACT inhaler Inhale 2 puffs into the lungs every 6 (six) hours as needed for wheezing or shortness of breath.   Yes [provider]  albuterol (PROVENTIL) (2.5 MG/3ML) 0.083% nebulizer solution Take 6 mLs (5 mg total) by nebulization every 6 (six) hours as needed for wheezing or shortness of breath. 03/03/15  Yes Etta Quill, NP  budesonide-formoterol [facility]) 80-4.5 MCG/ACT inhaler Inhale 2 puffs into the lungs 2 (two) times daily. Patient not taking: Reported on 06/30/2018 03/16/18   [name], MD  ibuprofen (ADVIL,MOTRIN) 800 MG tablet Take 1 tablet (800 mg total) by mouth every 8 (eight) hours as needed. Patient not taking: Reported on 06/30/2018 05/15/18   Erroll Luna, MD  metFORMIN (GLUCOPHAGE) 500 MG tablet Take 1 tablet (500 mg total) by mouth 2 (two) times daily with a meal. Patient not taking: Reported on 11/30/2017 09/21/15   [place]ger[place]y, Silver Huguenin, MD  oxyCODONE (OXY IR/ROXICODONE) 5 MG immediate release tablet Take 1 tablet (5 mg total) by mouth every 6 (six) hours as needed for severe pain. Patient not taking: Reported on 06/30/2018 05/15/18   Erroll Luna, MD  sildenafil (VIAGRA) 100 MG tablet Take 0.5-1 tablets (50-100 mg total) by mouth daily as needed for erectile dysfunction. Patient not taking: Reported on 06/30/2018 03/16/18   [name], MD    Family History Family History  Problem Relation Age of Onset  . Emphysema Mother   . Diabetes  Mother   . Emphysema Father   . Cancer Sister        Stomach cancer  . Diabetes Sister   . Stomach cancer Sister   . Diabetes Brother   . Colon cancer Neg Hx   . Colon polyps Neg Hx   . Esophageal cancer Neg Hx   . Rectal cancer Neg Hx     Social History Social History   Tobacco Use  . Smoking status: Current Every Day Smoker    Packs/day: 1.00    Types: Cigarettes  . Smokeless tobacco: Never Used  Substance Use Topics  . Alcohol use: Yes    Alcohol/week: 6.0  standard drinks    Types: 6 Cans of beer per week    Comment: daily- 3-4 beers a day   . Drug use: No     Allergies   Patient has no known allergies.   Review of Systems Review of Systems  Level 5 caveat because of his degree of respiratory distress. Physical Exam Updated Vital Signs BP (!) 140/94   Pulse 94   Temp 98 F (36.7 C) (Oral)   Resp 12   Ht 5\' 8"  (1.727 m)   Wt 65.8 kg   SpO2 99%   BMI 22.05 kg/m   Physical Exam Vitals signs and nursing note reviewed.  Constitutional:      General: He is in acute distress.     Appearance: He is well-developed. He is ill-appearing.  HENT:     Head: Normocephalic and atraumatic.  Eyes:     General:        Right eye: No discharge.        Left eye: No discharge.     Conjunctiva/sclera: Conjunctivae normal.  Neck:     Musculoskeletal: Neck supple.  Cardiovascular:     Rate and Rhythm: Regular rhythm. Tachycardia present.     Heart sounds: Normal heart sounds. No murmur. No friction rub. No gallop.   Pulmonary:     Effort: Respiratory distress present.     Breath sounds: Wheezing present.     Comments: Tripoding Abdominal:     General: There is no distension.     Palpations: Abdomen is soft.     Tenderness: There is no abdominal tenderness.  Musculoskeletal:        General: No tenderness.     Comments: Lower extremities symmetric as compared to each other. No calf tenderness. Negative Homan's. No palpable cords.   Skin:    General: Skin is warm and dry.  Neurological:     Mental Status: He is alert.  Psychiatric:        Behavior: Behavior normal.        Thought Content: Thought content normal.      ED Treatments / Results  Labs (all labs ordered are listed, but only abnormal results are displayed) Labs Reviewed  BLOOD GAS, VENOUS - Abnormal; Notable for the following components:      Result Value   pO2, Ven 65.0 (*)    Acid-Base Excess 2.5 (*)    All other components within normal limits  BASIC  METABOLIC PANEL - Abnormal; Notable for the following components:   Sodium 130 (*)    Chloride 95 (*)    Glucose, Bld 139 (*)    Calcium 8.8 (*)    All other components within normal limits  CBC WITH DIFFERENTIAL/PLATELET    EKG EKG Interpretation  Date/Time:  Saturday June 30 2018 18:34:08 EST Ventricular Rate:  106  PR Interval:    QRS Duration: 89 QT Interval:  347 QTC Calculation: 461 R Axis:   88 Text Interpretation:  Sinus tachycardia Atrial premature complex Biatrial enlargement Borderline right axis deviation Confirmed by Virgel Manifold [phone]) on 06/30/2018 7:07:53 PM   Radiology Dg Chest Portable 1 View  Result Date: 06/30/2018 CLINICAL DATA:  Pt is arriving for a COPD exacerbation. Pt has had trouble breathing for past three days. Wheezing. H/o Diabetes. Smoker. EXAM: PORTABLE CHEST 1 VIEW COMPARISON:  11/30/2017 FINDINGS: Lungs are hyperinflated. There are no focal consolidations or pleural effusions. Heart size is normal. No pulmonary edema. IMPRESSION: 1. Hyperinflation. 2. No focal acute pulmonary abnormality. Electronically Signed   By: [name] M.D.   On: 06/30/2018 19:25    Procedures Procedures (including critical care time)  CRITICAL CARE Performed by: Virgel Manifold Total critical care time: 35 minutes Critical care time was exclusive of separately billable procedures and treating other patients. Critical care was necessary to treat or prevent imminent or life-threatening deterioration. Critical care was time spent personally by me on the following activities: development of treatment plan with patient and/or surrogate as well as nursing, discussions with consultants, evaluation of patient's response to treatment, examination of patient, obtaining history from patient or surrogate, ordering and performing treatments and interventions, ordering and review of laboratory studies, ordering and review of radiographic studies, pulse oximetry and  re-evaluation of patient's condition.   Medications Ordered in ED Medications  albuterol (PROVENTIL) (2.5 MG/3ML) 0.083% nebulizer solution 5 mg (5 mg Nebulization Not Given 06/30/18 1843)  albuterol (PROVENTIL,VENTOLIN) solution continuous neb (15 mg/hr Nebulization New Bag/Given 06/30/18 1905)  ondansetron (ZOFRAN) injection 4 mg (4 mg Intravenous Given 06/30/18 1837)     Initial Impression / Assessment and Plan / ED Course  I have reviewed the triage vital signs and the nursing notes.  Pertinent labs & imaging results that were available during my care of the patient were reviewed by me and considered in my medical decision making (see chart for details).     62 year old male with COPD exacerbation.  To be placed on BiPAP given his work of breathing.  Improved, but he still needs admission for ongoing treatment/stabilization.  Final Clinical Impressions(s) / ED Diagnoses   Final diagnoses:  Acute exacerbation of chronic obstructive pulmonary disease [facility])    ED Discharge Orders    None       Virgel Manifold, MD 07/06/18 [phone]

## 2018-07-01 DIAGNOSIS — J9601 Acute respiratory failure with hypoxia: Secondary | ICD-10-CM

## 2018-07-01 DIAGNOSIS — Z720 Tobacco use: Secondary | ICD-10-CM

## 2018-07-01 DIAGNOSIS — Z8639 Personal history of other endocrine, nutritional and metabolic disease: Secondary | ICD-10-CM

## 2018-07-01 LAB — RESPIRATORY PANEL BY PCR
Adenovirus: NOT DETECTED
Bordetella pertussis: NOT DETECTED
Chlamydophila pneumoniae: NOT DETECTED
Coronavirus 229E: NOT DETECTED
Coronavirus HKU1: NOT DETECTED
Coronavirus NL63: NOT DETECTED
Coronavirus OC43: NOT DETECTED
INFLUENZA A-RVPPCR: NOT DETECTED
Influenza B: NOT DETECTED
Metapneumovirus: NOT DETECTED
Mycoplasma pneumoniae: NOT DETECTED
Parainfluenza Virus 1: NOT DETECTED
Parainfluenza Virus 2: NOT DETECTED
Parainfluenza Virus 3: NOT DETECTED
Parainfluenza Virus 4: NOT DETECTED
Respiratory Syncytial Virus: DETECTED — AB
Rhinovirus / Enterovirus: NOT DETECTED

## 2018-07-01 LAB — GLUCOSE, CAPILLARY
GLUCOSE-CAPILLARY: 172 mg/dL — AB (ref 70–99)
Glucose-Capillary: 145 mg/dL — ABNORMAL HIGH (ref 70–99)
Glucose-Capillary: 298 mg/dL — ABNORMAL HIGH (ref 70–99)
Glucose-Capillary: 198 mg/dL — ABNORMAL HIGH (ref 70–99)
Glucose-Capillary: 218 mg/dL — ABNORMAL HIGH (ref 70–99)
Glucose-Capillary: 238 mg/dL — ABNORMAL HIGH (ref 70–99)

## 2018-07-01 LAB — CBC
HCT: 46.6 % (ref 39.0–52.0)
Hemoglobin: 15.6 g/dL (ref 13.0–17.0)
MCH: 32 pg (ref 26.0–34.0)
MCHC: 33.5 g/dL (ref 30.0–36.0)
MCV: 95.7 fL (ref 80.0–100.0)
Platelets: 197 10*3/uL (ref 150–400)
RBC: 4.87 MIL/uL (ref 4.22–5.81)
RDW: 12.2 % (ref 11.5–15.5)
WBC: 2.8 10*3/uL — ABNORMAL LOW (ref 4.0–10.5)
nRBC: 0 % (ref 0.0–0.2)

## 2018-07-01 LAB — BASIC METABOLIC PANEL
ANION GAP: 14 (ref 5–15)
BUN: 11 mg/dL (ref 8–23)
CALCIUM: 9 mg/dL (ref 8.9–10.3)
CO2: 23 mmol/L (ref 22–32)
Chloride: 94 mmol/L — ABNORMAL LOW (ref 98–111)
Creatinine, Ser: 0.8 mg/dL (ref 0.61–1.24)
GFR calc Af Amer: >60 mL/min (ref >60)
GFR calc non Af Amer: >60 mL/min (ref >60)
Glucose, Bld: 183 mg/dL — ABNORMAL HIGH (ref 70–99)
Potassium: 4.3 mmol/L (ref 3.5–5.1)
Sodium: 131 mmol/L — ABNORMAL LOW (ref 135–145)

## 2018-07-01 LAB — MRSA PCR SCREENING: MRSA by PCR: NEGATIVE

## 2018-07-01 LAB — HEMOGLOBIN A1C
HEMOGLOBIN A1C: 5.4 % (ref 4.8–5.6)
Mean Plasma Glucose: 108.28 mg/dL

## 2018-07-01 MED ORDER — MOMETASONE FURO-FORMOTEROL FUM 100-5 MCG/ACT IN AERO
2.0000 | INHALATION_SPRAY | Freq: Two times a day (BID) | RESPIRATORY_TRACT | Status: DC
  Administered 2018-07-01 – 2018-07-02 (×3): 2 via RESPIRATORY_TRACT
  Filled 2018-07-01: qty 8.8

## 2018-07-01 MED ORDER — GUAIFENESIN-DM 100-10 MG/5ML PO SYRP
10.0000 mL | ORAL_SOLUTION | ORAL | Status: DC | PRN
  Administered 2018-07-01: 10 mL via ORAL
  Filled 2018-07-01: qty 10

## 2018-07-01 MED ORDER — AZITHROMYCIN 250 MG PO TABS
500.0000 mg | ORAL_TABLET | Freq: Every day | ORAL | Status: DC
  Administered 2018-07-01 – 2018-07-02 (×2): 500 mg via ORAL
  Filled 2018-07-01 (×2): qty 2

## 2018-07-01 MED ORDER — METHYLPREDNISOLONE SODIUM SUCC 125 MG IJ SOLR
60.0000 mg | Freq: Three times a day (TID) | INTRAMUSCULAR | Status: DC
  Administered 2018-07-01 – 2018-07-02 (×3): 60 mg via INTRAVENOUS
  Filled 2018-07-01 (×3): qty 2

## 2018-07-01 MED ORDER — ORAL CARE MOUTH RINSE: 15.0000 mL | Freq: Two times a day (BID) | OROMUCOSAL | Status: DC

## 2018-07-01 MED ORDER — CHLORHEXIDINE GLUCONATE 0.12 % MT SOLN
15.0000 mL | Freq: Two times a day (BID) | OROMUCOSAL | Status: DC
  Administered 2018-07-01 – 2018-07-02 (×3): 15 mL via OROMUCOSAL
  Filled 2018-07-01 (×3): qty 15

## 2018-07-01 NOTE — Progress Notes (Signed)
Patient ID: Duane Cuevas, male   DOB: 10/26/1955, 62 y.o.   MRN: [id number]  PROGRESS NOTE    Darryel Diodato  [facility]:[id number] DOB: 1956/05/03 DOA: 06/30/2018 PCP: [name], MD   Brief Narrative:  62 year old male with history of COPD, diabetes mellitus type 2 diet-controlled, tobacco abuse presented with worsening shortness of breath.  He was found to be hypoxic and needed BiPAP on presentation.  Chest x-ray was negative for any infiltrates.  He was admitted for COPD exacerbation and started on intravenous Solu-Medrol.   Assessment & Plan:   Principal Problem:   Acute exacerbation of chronic obstructive pulmonary disease (HCC) Active Problems:   Tobacco use   History of diet-controlled diabetes   COPD exacerbation with acute hypoxic respiratory failure -Required BiPAP on presentation.  Will try and wean off BiPAP and try a nasal cannula. -Increase Solu-Medrol to 60 mg IV every 8 hours.  Add Dulera.  Continue nebs.  Add Zithromax orally -We will check respiratory virus PCR.  Chest x-ray was negative for infiltrates  Tobacco abuse -Patient was counseled about tobacco cessation.  Nicotine patch  Diet-controlled diabetes mellitus -Not on any meds.  Last A1c was 6.0 on 04/02/2018.  CBGs with SSI   DVT prophylaxis: Lovenox Code Status: Full Family Communication: None at bedside Disposition Plan: Home in 1 to 3 days once clinically improved  Consultants: None  Procedures: None  Antimicrobials: None   Subjective: Patient seen and examined at bedside.  He is still on BiPAP.  He feels slightly better.  No overnight fever, nausea or vomiting.  He still short of breath.  Objective: Vitals:   07/01/18 0600 07/01/18 0700 07/01/18 0800 07/01/18 0834  BP: 129/78 (!) 133/91 137/75   Pulse: 86 88 82   Resp: 17 16 15    Temp:   97.8 F (36.6 C)   TempSrc:   Oral   SpO2: 100% 99% 99% 98%  Weight:      Height:        Intake/Output Summary (Last 24 hours) at  07/01/2018 0947 Last data filed at 07/01/2018 0600 Gross per 24 hour  Intake -  Output 1200 ml  Net -1200 ml   Filed Weights   06/30/18 1833  Weight: 65.8 kg    Examination:  General exam: Appears in mild distress secondary shortness of breath.  Still on BiPAP. Respiratory system: Bilateral decreased breath sounds at bases, scattered wheezing and crackles. Cardiovascular system: S1 & S2 heard, Rate controlled Gastrointestinal system: Abdomen is nondistended, soft and nontender. Normal bowel sounds heard. Extremities: No cyanosis, clubbing, edema  Central nervous system: Alert and oriented. No focal neurological deficits. Moving extremities Skin: No rashes, lesions or ulcers Psychiatry: Slightly anxious    Data Reviewed: I have personally reviewed following labs and imaging studies  CBC: Recent Labs  Lab 06/30/18 1840 07/01/18 0334  WBC 5.3 2.8*  NEUTROABS 3.3  --   HGB 15.2 15.6  HCT 45.1 46.6  MCV 94.7 95.7  PLT 220 662   Basic Metabolic Panel: Recent Labs  Lab 06/30/18 1840 07/01/18 0334  NA 130* 131*  K 4.2 4.3  CL 95* 94*  CO2 25 23  GLUCOSE 139* 183*  BUN 11 11  CREATININE 0.90 0.80  CALCIUM 8.8* 9.0   GFR: Estimated Creatinine Clearance: 89.1 mL/min (by C-G formula based on SCr of 0.8 mg/dL). Liver Function Tests: No results for input(s): AST, ALT, ALKPHOS, BILITOT, PROT, ALBUMIN in the last 168 hours. No results for input(s): LIPASE, AMYLASE in  the last 168 hours. No results for input(s): AMMONIA in the last 168 hours. Coagulation Profile: No results for input(s): INR, PROTIME in the last 168 hours. Cardiac Enzymes: No results for input(s): CKTOTAL, CKMB, CKMBINDEX, TROPONINI in the last 168 hours. BNP (last 3 results) No results for input(s): PROBNP in the last 8760 hours. HbA1C: Recent Labs    06/30/18 1840  HGBA1C 5.4   CBG: Recent Labs  Lab 06/30/18 2354 07/01/18 0355 07/01/18 0832  GLUCAP 170* 172* 145*   Lipid Profile: No  results for input(s): CHOL, HDL, LDLCALC, TRIG, CHOLHDL, LDLDIRECT in the last 72 hours. Thyroid Function Tests: No results for input(s): TSH, T4TOTAL, FREET4, T3FREE, THYROIDAB in the last 72 hours. Anemia Panel: No results for input(s): VITAMINB12, FOLATE, FERRITIN, TIBC, IRON, RETICCTPCT in the last 72 hours. Sepsis Labs: No results for input(s): PROCALCITON, LATICACIDVEN in the last 168 hours.  Recent Results (from the past 240 hour(s))  MRSA PCR Screening     Status: None   Collection Time: 07/01/18 12:21 AM  Result Value Ref Range Status   MRSA by PCR NEGATIVE NEGATIVE Final    Comment:        The GeneXpert MRSA Assay (FDA approved for NASAL specimens only), is one component of a comprehensive MRSA colonization surveillance program. It is not intended to diagnose MRSA infection nor to guide or monitor treatment for MRSA infections. Performed at [facility], [place] [address], [place], [place] [address]          Radiology Studies: Dg Chest Portable 1 View  Result Date: 06/30/2018 CLINICAL DATA:  Pt is arriving for a COPD exacerbation. Pt has had trouble breathing for past three days. Wheezing. H/o Diabetes. Smoker. EXAM: PORTABLE CHEST 1 VIEW COMPARISON:  11/30/2017 FINDINGS: Lungs are hyperinflated. There are no focal consolidations or pleural effusions. Heart size is normal. No pulmonary edema. IMPRESSION: 1. Hyperinflation. 2. No focal acute pulmonary abnormality. Electronically Signed   By: [name] M.D.   On: 06/30/2018 19:25        Scheduled Meds: . albuterol  5 mg Nebulization Once  . azithromycin  500 mg Oral Daily  . chlorhexidine  15 mL Mouth Rinse BID  . enoxaparin (LOVENOX) injection  40 mg Subcutaneous QHS  . insulin aspart  0-9 Units Subcutaneous Q4H  . ipratropium-albuterol  3 mL Nebulization Q6H  . mouth rinse  15 mL Mouth Rinse q12n4p  . methylPREDNISolone (SOLU-MEDROL) injection  60 mg Intravenous Q12H  .  mometasone-formoterol  2 puff Inhalation BID  . nicotine  21 mg Transdermal Daily  . sodium chloride flush  3 mL Intravenous Q12H   Continuous Infusions: . albuterol Stopped (06/30/18 2010)     LOS: 0 days        Aline August, MD [facility] Pager [phone]  If 7PM-7AM, please contact night-coverage [email or web address] Password [facility] 07/01/2018, 9:47 AM

## 2018-07-02 ENCOUNTER — Encounter (HOSPITAL_COMMUNITY): Payer: Self-pay

## 2018-07-02 LAB — BASIC METABOLIC PANEL
Anion gap: 9 (ref 5–15)
BUN: 19 mg/dL (ref 8–23)
CO2: 27 mmol/L (ref 22–32)
Calcium: 9.4 mg/dL (ref 8.9–10.3)
Chloride: 97 mmol/L — ABNORMAL LOW (ref 98–111)
Creatinine, Ser: 0.78 mg/dL (ref 0.61–1.24)
GFR calc Af Amer: >60 mL/min (ref >60)
GFR calc non Af Amer: >60 mL/min (ref >60)
Glucose, Bld: 147 mg/dL — ABNORMAL HIGH (ref 70–99)
POTASSIUM: 4.6 mmol/L (ref 3.5–5.1)
Sodium: 133 mmol/L — ABNORMAL LOW (ref 135–145)

## 2018-07-02 LAB — CBC WITH DIFFERENTIAL/PLATELET
Abs Immature Granulocytes: 0.06 10*3/uL (ref 0.00–0.07)
Basophils Absolute: 0 10*3/uL (ref 0.0–0.1)
Basophils Relative: 0 %
Eosinophils Absolute: 0 10*3/uL (ref 0.0–0.5)
Eosinophils Relative: 0 %
HCT: 48.4 % (ref 39.0–52.0)
Hemoglobin: 16.3 g/dL (ref 13.0–17.0)
Immature Granulocytes: 1 %
Lymphocytes Relative: 3 %
Lymphs Abs: 0.3 10*3/uL — ABNORMAL LOW (ref 0.7–4.0)
MCH: 32.1 pg (ref 26.0–34.0)
MCHC: 33.7 g/dL (ref 30.0–36.0)
MCV: 95.5 fL (ref 80.0–100.0)
MONOS PCT: 3 %
Monocytes Absolute: 0.4 10*3/uL (ref 0.1–1.0)
Neutro Abs: 10.2 10*3/uL — ABNORMAL HIGH (ref 1.7–7.7)
Neutrophils Relative %: 93 %
PLATELETS: 216 10*3/uL (ref 150–400)
RBC: 5.07 MIL/uL (ref 4.22–5.81)
RDW: 12 % (ref 11.5–15.5)
WBC: 11 10*3/uL — ABNORMAL HIGH (ref 4.0–10.5)
nRBC: 0 % (ref 0.0–0.2)

## 2018-07-02 LAB — GLUCOSE, CAPILLARY
Glucose-Capillary: 139 mg/dL — ABNORMAL HIGH (ref 70–99)
Glucose-Capillary: 237 mg/dL — ABNORMAL HIGH (ref 70–99)
Glucose-Capillary: 276 mg/dL — ABNORMAL HIGH (ref 70–99)

## 2018-07-02 LAB — BLOOD GAS, VENOUS
Acid-Base Excess: 2.5 mmol/L — ABNORMAL HIGH (ref 0.0–2.0)
Bicarbonate: 27.1 mmol/L (ref 20.0–28.0)
Delivery systems: POSITIVE
Expiratory PAP: 5
FIO2: 40
Inspiratory PAP: 10
Mode: POSITIVE
O2 Saturation: 91.9 %
Patient temperature: 98.6
pCO2, Ven: 44 mmHg (ref 44.0–60.0)
pH, Ven: 7.407 (ref 7.250–7.430)
pO2, Ven: 65 mmHg — ABNORMAL HIGH (ref 32.0–45.0)

## 2018-07-02 LAB — HIV ANTIBODY (ROUTINE TESTING W REFLEX): HIV Screen 4th Generation wRfx: NONREACTIVE

## 2018-07-02 LAB — MAGNESIUM: Magnesium: 2.5 mg/dL — ABNORMAL HIGH (ref 1.7–2.4)

## 2018-07-02 MED ORDER — PREDNISONE 20 MG PO TABS: 40.0000 mg | ORAL_TABLET | Freq: Every day | ORAL | 0 refills | Status: AC

## 2018-07-02 MED ORDER — GUAIFENESIN-DM 100-10 MG/5ML PO SYRP: 10.0000 mL | ORAL_SOLUTION | ORAL | 0 refills | Status: DC | PRN

## 2018-07-02 MED ORDER — ALBUTEROL SULFATE HFA 108 (90 BASE) MCG/ACT IN AERS: 2.0000 | INHALATION_SPRAY | RESPIRATORY_TRACT | 0 refills | Status: DC | PRN

## 2018-07-02 MED ORDER — INFLUENZA VAC SPLIT QUAD 0.5 ML IM SUSY
0.5000 mL | PREFILLED_SYRINGE | Freq: Once | INTRAMUSCULAR | Status: AC
  Administered 2018-07-02: 0.5 mL via INTRAMUSCULAR
  Filled 2018-07-02: qty 0.5

## 2018-07-02 MED ORDER — ONDANSETRON HCL 4 MG PO TABS: 4.0000 mg | ORAL_TABLET | Freq: Four times a day (QID) | ORAL | 0 refills | Status: DC | PRN

## 2018-07-02 NOTE — Progress Notes (Signed)
Pt. Removed from bipap at this time and remains on room air without distress.  Will continue to monitor.

## 2018-07-02 NOTE — Progress Notes (Addendum)
SATURATION QUALIFICATIONS:  Patient Saturations on Room Air at Rest = 95%  Patient Saturations on Room Air while Ambulating = 86%  Patient Saturations on 2 Liters of oxygen while Ambulating = 90%

## 2018-07-02 NOTE — Discharge Summary (Signed)
Physician Discharge Summary  Duane Cuevas MIW:[id number] DOB: 12-21-1955 DOA: 06/30/2018  PCP: [name], MD  Admit date: 06/30/2018 Discharge date: 07/02/2018  Admitted From: Home  disposition: Home  Recommendations for Outpatient Follow-up:  1. Follow up with PCP in 1 week 2. Follow-up with pulmonary as an outpatient 3. Comply with medications and follow-up 4. Stop smoking 5. Follow-up in the ED if symptoms worsen or new appear   Home Health: No  equipment/Devices: Oxygen via nasal cannula 2 L/min  Discharge Condition: Stable  CODE STATUS: Full Diet recommendation: Heart Healthy/carb modified  Brief/Interim Summary: 62 year old male with history of COPD, diabetes mellitus type 2 diet-controlled, tobacco abuse presented with worsening shortness of breath. He was found to be hypoxic and needed BiPAP on presentation. Chest x-ray was negative for any infiltrates. He was admitted for COPD exacerbation and started on intravenous Solu-Medrol.His respiratory status improved.  He has been off BiPAP.  He will need home oxygen on discharge.  He will be discharged on oral prednisone.  Discharge Diagnoses:  Principal Problem:   Acute exacerbation of chronic obstructive pulmonary disease (HCC) Active Problems:   Tobacco use   History of diet-controlled diabetes  COPD exacerbation with acute hypoxic respiratory failure -Required BiPAP on presentation.   -Currently off BiPAP.  On room air.  His oxygen saturation drops to the 80s with ambulation.  He will require oxygen via nasal cannula at least 2 L/min around-the-clock for now for COPD exacerbation and hypoxia -He was treated with intravenous Solu-Medrol.  He feels much better and wants to go home.  Counseled about compliance with inhalers at home including Symbicort.  Continue Symbicort.  Continue albuterol as needed.  Prednisone 40 mg daily for 7 days.  Outpatient pulmonary evaluation referral.  - respiratory virus PCR was  positive for RSV.  Chest x-ray was negative for infiltrates  Tobacco abuse -Patient was counseled about tobacco cessation.    Diet-controlled diabetes mellitus - Last A1c was 6.0 on 04/02/2018.    Continue metformin.  Outpatient follow-up  Discharge Instructions  Discharge Instructions    Ambulatory referral to Pulmonology   Complete by:  As directed    COPD exacerbation   Call MD for:  difficulty breathing, headache or visual disturbances   Complete by:  As directed    Call MD for:  extreme fatigue   Complete by:  As directed    Call MD for:  hives   Complete by:  As directed    Call MD for:  persistant dizziness or light-headedness   Complete by:  As directed    Call MD for:  persistant nausea and vomiting   Complete by:  As directed    Call MD for:  severe uncontrolled pain   Complete by:  As directed    Call MD for:  temperature >100.4   Complete by:  As directed    Diet - low sodium heart healthy   Complete by:  As directed    For home use only DME oxygen   Complete by:  As directed    Mode or (Route):  Nasal cannula   Liters per Minute:  2   Frequency:  Continuous (stationary and portable oxygen unit needed)   Oxygen conserving device:  Yes   Oxygen delivery system:  Gas   Increase activity slowly   Complete by:  As directed      Allergies as of 07/02/2018   No Known Allergies     Medication List    STOP  taking these medications   ibuprofen 800 MG tablet Commonly known as:  ADVIL,MOTRIN   metFORMIN 500 MG tablet Commonly known as:  GLUCOPHAGE   oxyCODONE 5 MG immediate release tablet Commonly known as:  Oxy IR/ROXICODONE   sildenafil 100 MG tablet Commonly known as:  VIAGRA     TAKE these medications   albuterol (2.5 MG/3ML) 0.083% nebulizer solution Commonly known as:  PROVENTIL Take 6 mLs (5 mg total) by nebulization every 6 (six) hours as needed for wheezing or shortness of breath. What changed:  Another medication with the same name was  changed. Make sure you understand how and when to take each.   albuterol 108 (90 Base) MCG/ACT inhaler Commonly known as:  PROVENTIL HFA;VENTOLIN HFA Inhale 2 puffs into the lungs every 4 (four) hours as needed for wheezing or shortness of breath. What changed:  when to take this   budesonide-formoterol 80-4.5 MCG/ACT inhaler Commonly known as:  SYMBICORT Inhale 2 puffs into the lungs 2 (two) times daily.   guaiFENesin-dextromethorphan 100-10 MG/5ML syrup Commonly known as:  ROBITUSSIN DM Take 10 mLs by mouth every 4 (four) hours as needed for cough.   ondansetron 4 MG tablet Commonly known as:  ZOFRAN Take 1 tablet (4 mg total) by mouth every 6 (six) hours as needed for nausea.   predniSONE 20 MG tablet Commonly known as:  DELTASONE Take 2 tablets (40 mg total) by mouth daily for 7 days.            Durable Medical Equipment  (From admission, onward)         Start     Ordered   07/02/18 0000  For home use only DME oxygen    Question Answer Comment  Mode or (Route) Nasal cannula   Liters per Minute 2   Frequency Continuous (stationary and portable oxygen unit needed)   Oxygen conserving device Yes   Oxygen delivery system Gas      07/02/18 1021         Follow-up Information    [name], MD. Schedule an appointment as soon as possible for a visit in 1 week(s).   Specialty:  Internal Medicine Contact information: [place] [address] [phone]          No Known Allergies  Consultations:  None   Procedures/Studies: Dg Chest Portable 1 View  Result Date: 06/30/2018 CLINICAL DATA:  Pt is arriving for a COPD exacerbation. Pt has had trouble breathing for past three days. Wheezing. H/o Diabetes. Smoker. EXAM: PORTABLE CHEST 1 VIEW COMPARISON:  11/30/2017 FINDINGS: Lungs are hyperinflated. There are no focal consolidations or pleural effusions. Heart size is normal. No pulmonary edema. IMPRESSION: 1. Hyperinflation. 2. No  focal acute pulmonary abnormality. Electronically Signed   By: [name] M.D.   On: 06/30/2018 19:25      Subjective: Patient seen and examined at bedside.  He feels much better this morning.  He thinks he is ready to go home.  He was counseled about tobacco cessation and need to use oxygen for now.  No overnight fever or vomiting.  No worsening shortness of breath.  Discharge Exam: Vitals:   07/02/18 0800 07/02/18 0900  BP:  130/65  Pulse: (!) 103 (!) 102  Resp: 19 17  Temp: 97.6 F (36.4 C)   SpO2: 95% 92%   Vitals:   07/02/18 0400 07/02/18 0700 07/02/18 0800 07/02/18 0900  BP: 137/84 139/74  130/65  Pulse: 70 (!) 102 (!) 103 (!)  102  Resp: 17 (!) 22 19 17   Temp:   97.6 F (36.4 C)   TempSrc:   Oral   SpO2: 98% 94% 95% 92%  Weight:      Height:        General: Pt is alert, awake, not in acute distress Cardiovascular: Slightly tachycardic, S1/S2 + Respiratory: bilateral decreased breath sounds at bases, very minimal wheezing Abdominal: Soft, NT, ND, bowel sounds + Extremities: no edema, no cyanosis    The results of significant diagnostics from this hospitalization (including imaging, microbiology, ancillary and laboratory) are listed below for reference.     Microbiology: Recent Results (from the past 240 hour(s))  MRSA PCR Screening     Status: None   Collection Time: 07/01/18 12:21 AM  Result Value Ref Range Status   MRSA by PCR NEGATIVE NEGATIVE Final    Comment:        The GeneXpert MRSA Assay (FDA approved for NASAL specimens only), is one component of a comprehensive MRSA colonization surveillance program. It is not intended to diagnose MRSA infection nor to guide or monitor treatment for MRSA infections. Performed at [facility], [place] [address], [place], [place] [address]   Respiratory Panel by PCR     Status: Abnormal   Collection Time: 07/01/18  9:54 AM  Result Value Ref Range Status   Adenovirus NOT DETECTED NOT  DETECTED Final   Coronavirus 229E NOT DETECTED NOT DETECTED Final   Coronavirus HKU1 NOT DETECTED NOT DETECTED Final   Coronavirus NL63 NOT DETECTED NOT DETECTED Final   Coronavirus OC43 NOT DETECTED NOT DETECTED Final   Metapneumovirus NOT DETECTED NOT DETECTED Final   Rhinovirus / Enterovirus NOT DETECTED NOT DETECTED Final   Influenza A NOT DETECTED NOT DETECTED Final   Influenza B NOT DETECTED NOT DETECTED Final   Parainfluenza Virus 1 NOT DETECTED NOT DETECTED Final   Parainfluenza Virus 2 NOT DETECTED NOT DETECTED Final   Parainfluenza Virus 3 NOT DETECTED NOT DETECTED Final   Parainfluenza Virus 4 NOT DETECTED NOT DETECTED Final   Respiratory Syncytial Virus DETECTED (A) NOT DETECTED Final    Comment: CRITICAL RESULT CALLED TO, READ BACK BY AND VERIFIED WITH: J WAGGE PHARMD [id number] 2010 BY GF    Bordetella pertussis NOT DETECTED NOT DETECTED Final   Chlamydophila pneumoniae NOT DETECTED NOT DETECTED Final   Mycoplasma pneumoniae NOT DETECTED NOT DETECTED Final    Comment: Performed at [facility], [place] [address] [place], [place] [address]     Labs: BNP (last 3 results) No results for input(s): BNP in the last 8760 hours. Basic Metabolic Panel: Recent Labs  Lab 06/30/18 1840 07/01/18 0334 07/02/18 0328  NA 130* 131* 133*  K 4.2 4.3 4.6  CL 95* 94* 97*  CO2 25 23 27   GLUCOSE 139* 183* 147*  BUN 11 11 19   CREATININE 0.90 0.80 0.78  CALCIUM 8.8* 9.0 9.4  MG  --   --  2.5*   Liver Function Tests: No results for input(s): AST, ALT, ALKPHOS, BILITOT, PROT, ALBUMIN in the last 168 hours. No results for input(s): LIPASE, AMYLASE in the last 168 hours. No results for input(s): AMMONIA in the last 168 hours. CBC: Recent Labs  Lab 06/30/18 1840 07/01/18 0334 07/02/18 0328  WBC 5.3 2.8* 11.0*  NEUTROABS 3.3  --  10.2*  HGB 15.2 15.6 16.3  HCT 45.1 46.6 48.4  MCV 94.7 95.7 95.5  PLT 220 197 216   Cardiac Enzymes: No results  for input(s): CKTOTAL, CKMB,  CKMBINDEX, TROPONINI in the last 168 hours. BNP: Invalid input(s): POCBNP CBG: Recent Labs  Lab 07/01/18 1607 07/01/18 2005 07/01/18 2316 07/02/18 0327 07/02/18 0732  GLUCAP 198* 218* 238* 139* 237*   D-Dimer No results for input(s): DDIMER in the last 72 hours. Hgb A1c Recent Labs    06/30/18 1840  HGBA1C 5.4   Lipid Profile No results for input(s): CHOL, HDL, LDLCALC, TRIG, CHOLHDL, LDLDIRECT in the last 72 hours. Thyroid function studies No results for input(s): TSH, T4TOTAL, T3FREE, THYROIDAB in the last 72 hours.  Invalid input(s): FREET3 Anemia work up No results for input(s): VITAMINB12, FOLATE, FERRITIN, TIBC, IRON, RETICCTPCT in the last 72 hours. Urinalysis    Component Value Date/Time   COLORURINE YELLOW 09/23/2015 0616   APPEARANCEUR CLEAR 09/23/2015 0616   LABSPEC 1.019 09/23/2015 0616   PHURINE 7.0 09/23/2015 0616   GLUCOSEU NEGATIVE 09/23/2015 0616   HGBUR TRACE (A) 09/23/2015 0616   BILIRUBINUR NEGATIVE 09/23/2015 0616   KETONESUR NEGATIVE 09/23/2015 0616   PROTEINUR 100 (A) 09/23/2015 0616   UROBILINOGEN 0.2 10/14/2014 1712   NITRITE NEGATIVE 09/23/2015 0616   LEUKOCYTESUR NEGATIVE 09/23/2015 0616   Sepsis Labs Invalid input(s): PROCALCITONIN,  WBC,  LACTICIDVEN Microbiology Recent Results (from the past 240 hour(s))  MRSA PCR Screening     Status: None   Collection Time: 07/01/18 12:21 AM  Result Value Ref Range Status   MRSA by PCR NEGATIVE NEGATIVE Final    Comment:        The GeneXpert MRSA Assay (FDA approved for NASAL specimens only), is one component of a comprehensive MRSA colonization surveillance program. It is not intended to diagnose MRSA infection nor to guide or monitor treatment for MRSA infections. Performed at [facility], [place] [address], [place], [place] [address]   Respiratory Panel by PCR     Status: Abnormal   Collection Time: 07/01/18  9:54 AM  Result Value Ref Range Status   Adenovirus NOT  DETECTED NOT DETECTED Final   Coronavirus 229E NOT DETECTED NOT DETECTED Final   Coronavirus HKU1 NOT DETECTED NOT DETECTED Final   Coronavirus NL63 NOT DETECTED NOT DETECTED Final   Coronavirus OC43 NOT DETECTED NOT DETECTED Final   Metapneumovirus NOT DETECTED NOT DETECTED Final   Rhinovirus / Enterovirus NOT DETECTED NOT DETECTED Final   Influenza A NOT DETECTED NOT DETECTED Final   Influenza B NOT DETECTED NOT DETECTED Final   Parainfluenza Virus 1 NOT DETECTED NOT DETECTED Final   Parainfluenza Virus 2 NOT DETECTED NOT DETECTED Final   Parainfluenza Virus 3 NOT DETECTED NOT DETECTED Final   Parainfluenza Virus 4 NOT DETECTED NOT DETECTED Final   Respiratory Syncytial Virus DETECTED (A) NOT DETECTED Final    Comment: CRITICAL RESULT CALLED TO, READ BACK BY AND VERIFIED WITH: J WAGGE PHARMD [id number] 2010 BY GF    Bordetella pertussis NOT DETECTED NOT DETECTED Final   Chlamydophila pneumoniae NOT DETECTED NOT DETECTED Final   Mycoplasma pneumoniae NOT DETECTED NOT DETECTED Final    Comment: Performed at [facility], [place] [address] [place], [place] [address]     Time coordinating discharge: 35 minutes  SIGNED:   Aline August, MD  [facility] 07/02/2018, 10:22 AM Pager: [phone]  If 7PM-7AM, please contact night-coverage [email or web address] Password [email or web address]

## 2018-07-02 NOTE — Care Management Note (Addendum)
Case Management Note  Patient Details  Name: Duane Cuevas MRN: [id number] Date of Birth: 1956/02/11  Subjective/Objective:                  Discharged   Action/Plan: Home o2 dme-[facility] sent information at 1044am/patient being dcd to home. Expected Discharge Date:  07/02/18               Expected Discharge Plan:  Home/Self Care  In-House Referral:     Discharge planning Services  CM Consult  Post Acute Care Choice:    Choice offered to:     DME Arranged:    DME Agency:     HH Arranged:    HH Agency:     Status of Service:  Completed, signed off  If discussed at [employer] of Stay Meetings, dates discussed:    Additional Comments:  Leeroy Cha, RN 07/02/2018, 8:26 AM

## 2018-07-02 NOTE — Progress Notes (Signed)
Discharge information provided to patient and patient's daughter. Opportunity for questions provided, all questions answered. Home O2 present (not in use). PIV removed.  Patient wheeled down to main entrance by RN.

## 2018-08-14 ENCOUNTER — Encounter: Payer: Self-pay | Admitting: Internal Medicine

## 2018-08-14 ENCOUNTER — Ambulatory Visit (INDEPENDENT_AMBULATORY_CARE_PROVIDER_SITE_OTHER): Payer: Self-pay | Admitting: Internal Medicine

## 2018-08-14 VITALS — BP 90/60 | HR 107 | Ht 68.0 in | Wt 151.0 lb

## 2018-08-14 DIAGNOSIS — F17210 Nicotine dependence, cigarettes, uncomplicated: Secondary | ICD-10-CM

## 2018-08-14 DIAGNOSIS — J449 Chronic obstructive pulmonary disease, unspecified: Secondary | ICD-10-CM | POA: Insufficient documentation

## 2018-08-14 MED ORDER — BUDESONIDE-FORMOTEROL FUMARATE 160-4.5 MCG/ACT IN AERO: 2.0000 | INHALATION_SPRAY | Freq: Two times a day (BID) | RESPIRATORY_TRACT | 11 refills | Status: DC

## 2018-08-14 MED ORDER — BUDESONIDE-FORMOTEROL FUMARATE 160-4.5 MCG/ACT IN AERO: 2.0000 | INHALATION_SPRAY | Freq: Two times a day (BID) | RESPIRATORY_TRACT | 0 refills | Status: DC

## 2018-08-14 NOTE — Progress Notes (Signed)
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 (poor spirometric testing) at initial ov     History of Present Illness  08/14/2018  Pulmonary/ 1st office eval/[facility] / no maint rx (can't afford symbicort) Chief Complaint  Patient presents with  . Pulmonary Consult    Referred by Dr. Starla Link for eval of COPD.  Pt c/o trouble with his breathing for approx 3 years.  He states he constantly feels SOB even without exertion. He also c/o cough with white to yellow sputum. He uses his albuterol inhaler every hour or two and he uses neb 8-12 x per day.   Dyspnea:  MMRC3 = can't walk 100 yards even at a slow pace at a flat grade s stopping due to sob /cough Cough: more in day than noct  Sleep: bed flat with 3 pillows / some noct cough/wheeze  SABA use: last used 30 min prior to ov/ way over use at baseline Prednisone always helps but drives up sugars and ends up taking wife's meds to control    No obvious day to day or daytime variability or assoc mucus plugs or hemoptysis or cp or chest tightness, subjective wheeze or overt sinus or hb symptoms.      Also denies any obvious fluctuation of symptoms with weather or environmental changes or other aggravating or alleviating factors except as outlined above   No unusual exposure hx or h/o childhood pna/ asthma or knowledge of premature birth.  Current Allergies, Complete Past Medical History, Past Surgical History, Family History, and Social History were reviewed in [employer] record.  ROS  The following are not active complaints unless bolded Hoarseness, sore throat, dysphagia, dental problems, itching, sneezing,  nasal congestion or discharge of excess mucus or purulent  secretions, ear ache,   fever, chills, sweats, unintended wt loss or wt gain, classically pleuritic or exertional cp,  orthopnea pnd or arm/hand swelling  or leg swelling, presyncope, palpitations, abdominal pain, anorexia, nausea, vomiting, diarrhea  or change in bowel habits or change in bladder habits, change in stools or change in urine, dysuria, hematuria,  rash, arthralgias, visual complaints, headache, numbness, weakness or ataxia or problems with walking or coordination,  change in mood or  memory.             Past Medical History:  Diagnosis Date  . Diabetes mellitus ([place]) 10/2014   pt denies being diabetic.   . Emphysema of lung ([place])   . Emphysema/COPD ([place]) 10/2014  . Hepatic steatosis   . Thrombocytopenia ([place]) 09/2015   platelets in 120s.     Outpatient Medications Prior to Visit  Medication Sig Dispense Refill  . albuterol (PROVENTIL HFA;VENTOLIN HFA) 108 (90 Base) MCG/ACT inhaler Inhale 2 puffs into the lungs every 4 (four) hours as needed for wheezing or shortness of breath. 1 Inhaler 0  . albuterol (PROVENTIL) (2.5 MG/3ML) 0.083% nebulizer solution Take 6 mLs (5 mg total) by nebulization every 6 (six) hours as needed for wheezing or shortness of breath. 75 mL 0  .       . ondansetron (ZOFRAN) 4 MG tablet Take 1 tablet (4 mg total) by mouth every 6 (six) hours as needed for  nausea. 20 tablet 0  . OXYGEN 2 lpm as needed per pt    . guaiFENesin-dextromethorphan (ROBITUSSIN DM) 100-10 MG/5ML syrup Take 10 mLs by mouth every 4 (four) hours as needed for cough. 118 mL 0      Objective:     BP 90/60 (BP Location: Left Arm, Cuff Size: Normal)   Pulse (!) 107   Ht 5\' 8"  (1.727 m)   Wt 151 lb (68.5 kg)   SpO2 100%   BMI 22.96 kg/m   SpO2: 100 %  RA    amb latino male nad    HEENT: Full dentures / nl  turbinates bilaterally, and oropharynx. Nl external ear canals without cough reflex   NECK :  without JVD/Nodes/TM/ nl carotid upstrokes bilaterally   LUNGS: no  acc muscle use,  Mod barrel chest  chest with  insp/exp rhonchi  without cough on insp or exp maneuvers and mod hyper resonance to percussion.   CV:  RRR  no s3 or murmur or increase in P2, and no edema   ABD:  soft and nontender with mid insp hoover's in supine position. No bruits or organomegaly appreciated, bowel sounds nl  MS:  Nl gait/ ext warm without deformities, calf tenderness, cyanosis or clubbing No obvious joint restrictions   SKIN: warm and dry without lesions    NEURO:  alert, approp, nl sensorium with  no motor or cerebellar deficits apparent.       I personally reviewed images and agree with radiology impression as follows:  CXR:   06/30/18 1. Hyperinflation. 2. No focal acute pulmonary abnormality.     Labs ordered 08/14/2018    Alpha one AT screen     Assessment   No problem-specific Assessment & Plan notes found for this encounter.     Christinia Gully, MD 08/14/2018

## 2018-08-14 NOTE — Patient Instructions (Addendum)
Plan A = Automatic = Symbicort 160 Take 2 puffs first thing in am and then another 2 puffs about 12 hours later and we can start the paperwork for you to get it for free.   Work on inhaler technique:  relax and gently blow all the way out then take a nice smooth deep breath back in, triggering the inhaler at same time you start breathing in.  Hold for up to 5 seconds if you can. Blow out thru nose. Rinse and gargle with water when done.  Arm and Hammer toothpaste is best to use right after use.  Plan B = Backup Only use your albuterol inhaler as a rescue medication to be used if you can't catch your breath by resting or doing a relaxed purse lip breathing pattern.  - The less you use it, the better it will work when you need it. - Ok to use the inhaler up to 2 puffs  every 4 hours if you must but call for appointment if use goes up over your usual need - Don't leave home without it !!  (think of it like the spare tire for your car)     The key is to stop smoking completely before smoking completely stops you! For smoking cessation classes call [phone]      Please remember to go to the lab department  for your tests - we will call you with the results when they are available.       I will advise Dr Mitchel Honour on steps you  can take if not doing better on Symbicort 160.   Plan A = Automtico = Symbicort 160 Tome 2 inhalaciones primero en la maana y luego otras 2 inhalaciones aproximadamente 12 horas ms tarde y podemos comenzar el papeleo para que lo obtenga de forma gratuita.   Trabaje en la tcnica del inhalador: reljese y sople suavemente hasta que vuelva a respirar suavemente y profundamente, activando el inhalador al mismo tiempo que comienza a Advice worker. Sostenga durante 5 segundos si puede. Soplar por la Doran Durand. Enjuague y haga grgaras con agua cuando haya terminado. La pasta de dientes Arm and Hammer es mejor usarla inmediatamente despus de usarla.  Plan B = Copia de  seguridad Solo use su inhalador de albuterol como medicamento de rescate para usarlo si no puede recuperar el aliento descansando o haciendo un patrn relajado de respiracin con el labio de la bolsa. - Cuanto menos lo use, mejor funcionar cuando lo necesite. - Est bien usar Chartered loss adjuster 2 inhalaciones cada 4 horas si tiene que [place], pero llame a una cita si el uso supera su necesidad habitual - No salgas de casa sin ella! (piense en ello como la llanta de repuesto para su automvil)    La clave es dejar de fumar por completo antes de fumar por completo! Para clases para dejar de fumar llame al [phone]     Recuerde ir al departamento de laboratorio para sus pruebas; lo llamaremos con los resultados cuando estn disponibles.       Aconsejar al Dr. Estill Bamberg los pasos que puede seguir si no le va mejor en Symbicort 160.

## 2018-08-15 ENCOUNTER — Encounter: Payer: Self-pay | Admitting: Internal Medicine

## 2018-08-15 DIAGNOSIS — F17210 Nicotine dependence, cigarettes, uncomplicated: Secondary | ICD-10-CM | POA: Insufficient documentation

## 2018-08-15 NOTE — Assessment & Plan Note (Addendum)
Active smoker who can't afford his meds/ no insurance -  Spirometry 08/14/2018  FEV1 1.1 (34%)  Ratio 0.68 -  08/14/2018  After extensive coaching inhaler device,  effectiveness =    90% from a baseline of 50% so rec symbicort 160 2bid / [place] and me referral for free meds   DDX of  difficult airways management almost all start with A and  include Adherence, Ace Inhibitors, Acid Reflux, Active Sinus Disease, Alpha 1 Antitripsin deficiency, Anxiety masquerading as Airways dz,  ABPA,  Allergy(esp in young), Aspiration (esp in elderly), Adverse effects of meds,  Active smoking or vaping, A bunch of PE's (a small clot burden can't cause this syndrome unless there is already severe underlying pulm or vascular dz with poor reserve) plus two Bs  = Bronchiectasis and Beta blocker use..and one C= CHF   In this case Adherence is the biggest issue and starts with  inability to use HFA effectively and also  understand that SABA treats the symptoms but doesn't get to the underlying problem (inflammation).  I used  the analogy of putting steroid cream on a rash to help explain the meaning of topical therapy and the need to get the drug to the target tissue.   - see hfa teaching - always return with all meds in hand using a trust but verify approach to confirm accurate Medication  Reconciliation The principal here is that until we are certain that the  patients are doing what we've asked, it makes no sense to ask them to do more.  -I found communication to be an extremely difficult challenge(even with interpreter)  with this patient both in performing his spirometry appropriately and understanding what his problems are as far as acquiring medications and helping him with the smoking cessation issue.  Rather than do this through an interpreter, we have to pulmonologist to speak Spanish, Dr. Lake Bells and Dr. Patsey Berthold and I note that his primary care doctor also is fluent in [place] so I will do my best to serve as a consultant  here and point them in the right direction but regular follow-up in his clinic will not be helpful without better communication which I find very challenging to an interpreter in cases of poor control of asthma/copd. - referred to [facility] and me for free rx but advised it makes no sense to spend money on cigarettes and then ask for help paying for his medications to offset the effects of cigarettes    Active smoking tops the rest of the list > (see separate a/p)   ? Adverse drug effects> none listed  ? Alpha one [facility] > labs sent

## 2018-08-15 NOTE — Assessment & Plan Note (Signed)
Counseled re importance of smoking cessation but did not meet time criteria for separate billing    Total time devoted to counseling  > 50 % of initial  45  min office visit:  review case with pt/  device teaching which extended face to face time for this visit  discussion of options/alternatives/ personally creating written customized instructions in [place] [employer]) in presence of pt  then going over those specific  Instructions directly with the pt including how to use all of the meds but in particular covering each new medication in detail and the difference between the maintenance= "automatic" meds and the prns using an action plan format for the latter (If this problem/symptom => do that organization reading Left to right).  Please see AVS from this visit for a full list of these instructions which I personally wrote for this pt and  are unique to this visit.

## 2018-08-21 LAB — ALPHA-1 ANTITRYPSIN PHENOTYPE: A-1 Antitrypsin, Ser: 169 mg/dL (ref 83–199)

## 2018-09-16 ENCOUNTER — Encounter (HOSPITAL_COMMUNITY): Payer: Self-pay

## 2018-09-16 ENCOUNTER — Emergency Department (HOSPITAL_COMMUNITY): Payer: Self-pay

## 2018-09-16 ENCOUNTER — Emergency Department (HOSPITAL_COMMUNITY)
Admission: EM | Admit: 2018-09-16 | Discharge: 2018-09-17 | Disposition: A | Discharge status: Home / Self Care | Payer: Self-pay | Attending: Emergency Medicine | Admitting: Emergency Medicine

## 2018-09-16 DIAGNOSIS — J441 Chronic obstructive pulmonary disease with (acute) exacerbation: Secondary | ICD-10-CM | POA: Insufficient documentation

## 2018-09-16 DIAGNOSIS — E119 Type 2 diabetes mellitus without complications: Secondary | ICD-10-CM | POA: Insufficient documentation

## 2018-09-16 DIAGNOSIS — Z79899 Other long term (current) drug therapy: Secondary | ICD-10-CM | POA: Insufficient documentation

## 2018-09-16 DIAGNOSIS — F17210 Nicotine dependence, cigarettes, uncomplicated: Secondary | ICD-10-CM | POA: Insufficient documentation

## 2018-09-16 LAB — CBC
HCT: 43.9 % (ref 39.0–52.0)
Hemoglobin: 14.8 g/dL (ref 13.0–17.0)
MCH: 31.9 pg (ref 26.0–34.0)
MCHC: 33.7 g/dL (ref 30.0–36.0)
MCV: 94.6 fL (ref 80.0–100.0)
Platelets: 208 10*3/uL (ref 150–400)
RBC: 4.64 MIL/uL (ref 4.22–5.81)
RDW: 12.6 % (ref 11.5–15.5)
WBC: 7.9 10*3/uL (ref 4.0–10.5)
nRBC: 0 % (ref 0.0–0.2)

## 2018-09-16 LAB — BASIC METABOLIC PANEL
Anion gap: 9 (ref 5–15)
BUN: 8 mg/dL (ref 8–23)
CHLORIDE: 92 mmol/L — AB (ref 98–111)
CO2: 24 mmol/L (ref 22–32)
Calcium: 8.3 mg/dL — ABNORMAL LOW (ref 8.9–10.3)
Creatinine, Ser: 0.74 mg/dL (ref 0.61–1.24)
GFR calc Af Amer: >60 mL/min (ref >60)
GFR calc non Af Amer: >60 mL/min (ref >60)
Glucose, Bld: 172 mg/dL — ABNORMAL HIGH (ref 70–99)
Potassium: 3.6 mmol/L (ref 3.5–5.1)
SODIUM: 125 mmol/L — AB (ref 135–145)

## 2018-09-16 LAB — I-STAT TROPONIN, ED: Troponin i, poc: 0 ng/mL (ref 0.00–0.08)

## 2018-09-16 MED ORDER — ALBUTEROL SULFATE HFA 108 (90 BASE) MCG/ACT IN AERS: 2.0000 | INHALATION_SPRAY | RESPIRATORY_TRACT | 0 refills | Status: DC | PRN

## 2018-09-16 MED ORDER — ALBUTEROL SULFATE (2.5 MG/3ML) 0.083% IN NEBU: 5.0000 mg | INHALATION_SOLUTION | Freq: Once | RESPIRATORY_TRACT | Status: DC

## 2018-09-16 MED ORDER — ALBUTEROL SULFATE HFA 108 (90 BASE) MCG/ACT IN AERS
4.0000 | INHALATION_SPRAY | Freq: Once | RESPIRATORY_TRACT | Status: AC
  Administered 2018-09-17: 4 via RESPIRATORY_TRACT
  Filled 2018-09-16: qty 6.7

## 2018-09-16 MED ORDER — IPRATROPIUM-ALBUTEROL 0.5-2.5 (3) MG/3ML IN SOLN: 3.0000 mL | Freq: Once | RESPIRATORY_TRACT | Status: DC

## 2018-09-16 MED ORDER — PREDNISONE 20 MG PO TABS
60.0000 mg | ORAL_TABLET | Freq: Once | ORAL | Status: AC
  Administered 2018-09-16: 60 mg via ORAL
  Filled 2018-09-16: qty 3

## 2018-09-16 MED ORDER — IPRATROPIUM BROMIDE 0.02 % IN SOLN
0.5000 mg | Freq: Once | RESPIRATORY_TRACT | Status: AC
  Administered 2018-09-16: 0.5 mg via RESPIRATORY_TRACT
  Filled 2018-09-16: qty 2.5

## 2018-09-16 MED ORDER — PREDNISONE 20 MG PO TABS: 60.0000 mg | ORAL_TABLET | Freq: Every day | ORAL | 0 refills | Status: AC

## 2018-09-16 MED ORDER — ALBUTEROL (5 MG/ML) CONTINUOUS INHALATION SOLN
10.0000 mg/h | INHALATION_SOLUTION | Freq: Once | RESPIRATORY_TRACT | Status: AC
  Administered 2018-09-16: 10 mg/h via RESPIRATORY_TRACT
  Filled 2018-09-16: qty 20

## 2018-09-16 NOTE — Progress Notes (Signed)
RT to room to start ordered continuous neb. Pt currently being transported to [place].Marland Kitchen

## 2018-09-16 NOTE — ED Triage Notes (Signed)
Pt was washing the car and started to have sob and he took a inhaler and didn't help, then took a nebulizer treatment and didn't help and call 911. Pt was given albuterol, duo neb, 2g of mag and 125 of solumedrol.

## 2018-09-16 NOTE — ED Provider Notes (Signed)
[facility] EMERGENCY DEPARTMENT Provider Note   CSN: [id number] Arrival date & time: 09/16/18  2108    History   Chief Complaint Chief Complaint  Patient presents with  . Shortness of Breath    HPI Duane Cuevas is a 63 y.o. male.     The history is provided by the patient.  Shortness of Breath  Severity:  Moderate Onset quality:  Gradual Timing:  Constant Progression:  Worsening Chronicity:  Recurrent Context: smoke exposure (has not had his medications )   Relieved by:  None tried Worsened by:  Exertion and smoke exposure Associated symptoms: wheezing   Associated symptoms: no abdominal pain, no chest pain, no cough, no ear pain, no fever, no rash, no sore throat, no sputum production and no vomiting   Risk factors: tobacco use     Past Medical History:  Diagnosis Date  . Diabetes mellitus ([place]) 10/2014   pt denies being diabetic.   . Emphysema of lung ([place])   . Emphysema/COPD ([place]) 10/2014  . Hepatic steatosis   . Thrombocytopenia ([place]) 09/2015   platelets in 120s.     Patient Active Problem List   Diagnosis Date Noted  . Cigarette smoker 08/15/2018  . COPD ? GOLD III/ active smoker 08/14/2018  . Acute exacerbation of chronic obstructive pulmonary disease ([place]) 06/30/2018  . Tobacco use 06/30/2018  . History of diet-controlled diabetes 06/30/2018  . Abdominal pain   . Gastritis 09/23/2015  . Diabetes mellitus ([place]) 09/23/2015  . Hypokalemia 09/23/2015  . CAP (community acquired pneumonia) 09/19/2015  . COPD (chronic obstructive pulmonary disease) ([place]) 10/14/2014    Past Surgical History:  Procedure Laterality Date  . ESOPHAGOGASTRODUODENOSCOPY N/A 09/23/2015   Procedure: ESOPHAGOGASTRODUODENOSCOPY (EGD);  Surgeon: Ladene Artist, MD;  Location: [facility];  Service: Endoscopy;  Laterality: N/A;  . FOOT SURGERY    . INGUINAL HERNIA REPAIR Left 05/15/2018   Procedure: LEFT INGUINAL HERNIA REPAIR WITH MESH;  Surgeon: Erroll Luna, MD;  Location: [place];  Service: General;  Laterality: Left;  . INSERTION OF MESH Left 05/15/2018   Procedure: INSERTION OF MESH;  Surgeon: Erroll Luna, MD;  Location: [place];  Service: General;  Laterality: Left;  . UPPER GASTROINTESTINAL ENDOSCOPY          Home Medications    Prior to Admission medications   Medication Sig Start Date End Date Taking? Authorizing Provider  budesonide-formoterol (SYMBICORT) 160-4.5 MCG/ACT inhaler Inhale 2 puffs into the lungs 2 (two) times daily. 08/14/18  Yes Tanda Rockers, MD  OXYGEN Inhale 2 L into the lungs daily as needed (SOB).    Yes [provider]  albuterol (PROVENTIL HFA;VENTOLIN HFA) 108 (90 Base) MCG/ACT inhaler Inhale 2 puffs into the lungs every 4 (four) hours as needed for wheezing or shortness of breath. 09/16/18   Kadiatou Oplinger, DO  ondansetron (ZOFRAN) 4 MG tablet Take 1 tablet (4 mg total) by mouth every 6 (six) hours as needed for nausea. Patient not taking: Reported on 09/16/2018 07/02/18   Aline August, MD  predniSONE (DELTASONE) 20 MG tablet Take 3 tablets (60 mg total) by mouth daily for 5 days. 09/16/18 09/21/18  Lennice Sites, DO    Family History Family History  Problem Relation Age of Onset  . Emphysema Mother   . Diabetes Mother   . Emphysema Father   . Cancer Sister        Stomach cancer  . Diabetes Sister   . Stomach cancer Sister   .  Diabetes Brother   . Colon cancer Neg Hx   . Colon polyps Neg Hx   . Esophageal cancer Neg Hx   . Rectal cancer Neg Hx     Social History Social History   Tobacco Use  . Smoking status: Current Every Day Smoker    Packs/day: 0.50    Years: 47.00    Pack years: 23.50    Types: Cigarettes  . Smokeless tobacco: Never Used  Substance Use Topics  . Alcohol use: Yes    Comment: occ  . Drug use: No     Allergies   Patient has no known allergies.   Review of Systems Review of Systems  Constitutional: Negative for chills and fever.  HENT: Negative  for ear pain and sore throat.   Eyes: Negative for pain and visual disturbance.  Respiratory: Positive for chest tightness, shortness of breath and wheezing. Negative for cough and sputum production.   Cardiovascular: Negative for chest pain and palpitations.  Gastrointestinal: Negative for abdominal pain and vomiting.  Genitourinary: Negative for dysuria and hematuria.  Musculoskeletal: Negative for arthralgias and back pain.  Skin: Negative for color change and rash.  Neurological: Negative for seizures and syncope.  All other systems reviewed and are negative.    Physical Exam Updated Vital Signs  ED Triage Vitals  Enc Vitals Group     BP 09/16/18 2115 122/76     Pulse Rate 09/16/18 2115 95     Resp 09/16/18 2115 (!) 28     Temp 09/16/18 2120 98 F (36.7 C)     Temp Source 09/16/18 2120 Oral     SpO2 09/16/18 2113 98 %     Weight 09/16/18 2121 156 lb (70.8 kg)     Height 09/16/18 2121 5\' 8"  (1.727 m)     Head Circumference --      Peak Flow --      Pain Score 09/16/18 2121 5     Pain Loc --      Pain Edu? --      Excl. in [place]? --     Physical Exam Vitals signs and nursing note reviewed.  Constitutional:      General: He is not in acute distress.    Appearance: He is well-developed. He is not ill-appearing.  HENT:     Head: Normocephalic and atraumatic.  Eyes:     Conjunctiva/sclera: Conjunctivae normal.     Pupils: Pupils are equal, round, and reactive to light.  Neck:     Musculoskeletal: Normal range of motion and neck supple.  Cardiovascular:     Rate and Rhythm: Normal rate and regular rhythm.     Pulses: Normal pulses.     Heart sounds: Normal heart sounds. No murmur.  Pulmonary:     Effort: Tachypnea present. No respiratory distress.     Breath sounds: Decreased breath sounds and wheezing present. No rhonchi or rales.  Abdominal:     Palpations: Abdomen is soft.     Tenderness: There is no abdominal tenderness.  Musculoskeletal: Normal range of motion.      Right lower leg: No edema.     Left lower leg: No edema.  Skin:    General: Skin is warm and dry.     Capillary Refill: Capillary refill takes less than 2 seconds.  Neurological:     General: No focal deficit present.     Mental Status: He is alert.  Psychiatric:        Mood and Affect:  Mood normal.      ED Treatments / Results  Labs (all labs ordered are listed, but only abnormal results are displayed) Labs Reviewed  BASIC METABOLIC PANEL - Abnormal; Notable for the following components:      Result Value   Sodium 125 (*)    Chloride 92 (*)    Glucose, Bld 172 (*)    Calcium 8.3 (*)    All other components within normal limits  CBC  I-STAT TROPONIN, ED    EKG EKG Interpretation  Date/Time:  Sunday September 16 2018 21:15:26 EDT Ventricular Rate:  95 PR Interval:    QRS Duration: 107 QT Interval:  376 QTC Calculation: 473 R Axis:   84 Text Interpretation:  Sinus rhythm Atrial premature complex Right atrial enlargement Consider right ventricular hypertrophy Confirmed by Lennice Sites [phone]) on 09/16/2018 9:28:15 PM   Radiology Dg Chest 2 View  Result Date: 09/16/2018 CLINICAL DATA:  Shortness of breath that developed while washing his car and has not subsided. Smoker. EXAM: CHEST - 2 VIEW COMPARISON:  06/30/2018. FINDINGS: Normal sized heart. Clear lungs. Stable small left lower lobe calcified granuloma. Stable mild hyperexpansion of the lungs with mildly prominent interstitial markings. Unremarkable bones. IMPRESSION: No acute abnormality. Stable mild changes of COPD. Electronically Signed   By: Claudie Revering M.D.   On: 09/16/2018 22:04    Procedures Procedures (including critical care time)  Medications Ordered in ED Medications  albuterol (PROVENTIL HFA;VENTOLIN HFA) 108 (90 Base) MCG/ACT inhaler 4 puff (has no administration in time range)  predniSONE (DELTASONE) tablet 60 mg (60 mg Oral Given 09/16/18 2308)  albuterol (PROVENTIL,VENTOLIN) solution continuous  neb (10 mg/hr Nebulization Given 09/16/18 2213)  ipratropium (ATROVENT) nebulizer solution 0.5 mg (0.5 mg Nebulization Given 09/16/18 2213)     Initial Impression / Assessment and Plan / ED Course  I have reviewed the triage vital signs and the nursing notes.  Pertinent labs & imaging results that were available during my care of the patient were reviewed by me and considered in my medical decision making (see chart for details).     Duane Valle-Perezis a 63 year old male with history of COPD who presents to the ED with shortness of breath.  Patient with normal vitals.  No fever.  Patient with worsening symptoms over the last several days.  Continues to smoke cigarettes.  States that he has been without his albuterol.  Does intermittently use oxygen at home.  Patient with wheezing throughout on exam.  Has some chest tightness.  Has some increased work of breathing.  Patient was given continuous albuterol and Atrovent treatment and steroids.  Chest x-ray showed no obvious pneumonia, pneumothorax, pleural effusion.  EKG shows sinus rhythm.  Troponin within normal limits.  Does not have any concerning symptoms for cardiac chest pain.  No significant anemia, leukocytosis, kidney injury.  Has history of hyponatremia and is slightly lower today from 130 to 125.  Although not exhibiting any symptoms of hyponatremia.  Patient felt better after breathing treatment.  Would like to attempt treatment at home.  I think that this is reasonable.  His work of breathing has improved.  He was given albuterol inhaler to go home with.  And given prescription for prednisone.  Recommend follow-up with primary care doctor and given return precautions.  This chart was dictated using voice recognition software.  Despite best efforts to proofread,  errors can occur which can change the documentation meaning.    Final Clinical Impressions(s) / ED Diagnoses  Final diagnoses:  COPD exacerbation [facility])    ED Discharge Orders          Ordered    predniSONE (DELTASONE) 20 MG tablet  Daily     09/16/18 2334    albuterol (PROVENTIL HFA;VENTOLIN HFA) 108 (90 Base) MCG/ACT inhaler  Every 4 hours PRN     09/16/18 2334           Lennice Sites, DO 09/16/18 2343

## 2018-09-17 NOTE — ED Notes (Signed)
PT states understanding of care given, follow up care, and medication prescribed. PT ambulated from ED to car with a steady gait. 

## 2019-01-19 ENCOUNTER — Encounter (HOSPITAL_COMMUNITY): Payer: Self-pay | Admitting: *Deleted

## 2019-01-19 ENCOUNTER — Other Ambulatory Visit: Payer: Self-pay

## 2019-01-19 ENCOUNTER — Emergency Department (HOSPITAL_COMMUNITY)
Admission: EM | Admit: 2019-01-19 | Discharge: 2019-01-20 | Disposition: A | Discharge status: Home / Self Care | Payer: Self-pay | Attending: Emergency Medicine | Admitting: Emergency Medicine

## 2019-01-19 DIAGNOSIS — F17210 Nicotine dependence, cigarettes, uncomplicated: Secondary | ICD-10-CM | POA: Insufficient documentation

## 2019-01-19 DIAGNOSIS — E119 Type 2 diabetes mellitus without complications: Secondary | ICD-10-CM | POA: Insufficient documentation

## 2019-01-19 DIAGNOSIS — Z79899 Other long term (current) drug therapy: Secondary | ICD-10-CM | POA: Insufficient documentation

## 2019-01-19 DIAGNOSIS — Z20828 Contact with and (suspected) exposure to other viral communicable diseases: Secondary | ICD-10-CM | POA: Insufficient documentation

## 2019-01-19 DIAGNOSIS — J441 Chronic obstructive pulmonary disease with (acute) exacerbation: Secondary | ICD-10-CM | POA: Insufficient documentation

## 2019-01-19 LAB — CBC
HCT: 47.4 % (ref 39.0–52.0)
Hemoglobin: 16.5 g/dL (ref 13.0–17.0)
MCH: 32.4 pg (ref 26.0–34.0)
MCHC: 34.8 g/dL (ref 30.0–36.0)
MCV: 92.9 fL (ref 80.0–100.0)
Platelets: 234 10*3/uL (ref 150–400)
RBC: 5.1 MIL/uL (ref 4.22–5.81)
RDW: 11.4 % — ABNORMAL LOW (ref 11.5–15.5)
WBC: 8.5 10*3/uL (ref 4.0–10.5)
nRBC: 0 % (ref 0.0–0.2)

## 2019-01-19 LAB — BASIC METABOLIC PANEL
Anion gap: 11 (ref 5–15)
BUN: 13 mg/dL (ref 8–23)
CO2: 26 mmol/L (ref 22–32)
Calcium: 9.4 mg/dL (ref 8.9–10.3)
Chloride: 93 mmol/L — ABNORMAL LOW (ref 98–111)
Creatinine, Ser: 0.91 mg/dL (ref 0.61–1.24)
GFR calc Af Amer: >60 mL/min (ref >60)
GFR calc non Af Amer: >60 mL/min (ref >60)
Glucose, Bld: 144 mg/dL — ABNORMAL HIGH (ref 70–99)
Potassium: 4.2 mmol/L (ref 3.5–5.1)
Sodium: 130 mmol/L — ABNORMAL LOW (ref 135–145)

## 2019-01-19 NOTE — ED Triage Notes (Signed)
Pt reports having emphysema, increase in sob since yesterday. Has productive cough with clear sputum. Denies fever. No relief with inhaler at home.

## 2019-01-20 ENCOUNTER — Emergency Department (HOSPITAL_COMMUNITY): Payer: Self-pay

## 2019-01-20 LAB — SARS CORONAVIRUS 2 BY RT PCR (HOSPITAL ORDER, PERFORMED IN ~~LOC~~ HOSPITAL LAB): SARS Coronavirus 2: NEGATIVE

## 2019-01-20 MED ORDER — AEROCHAMBER PLUS FLO-VU LARGE MISC
1.0000 | Freq: Once | Status: AC
  Administered 2019-01-20: 1

## 2019-01-20 MED ORDER — ALBUTEROL SULFATE HFA 108 (90 BASE) MCG/ACT IN AERS: 1.0000 | INHALATION_SPRAY | Freq: Four times a day (QID) | RESPIRATORY_TRACT | 1 refills | Status: DC | PRN

## 2019-01-20 MED ORDER — PREDNISONE 50 MG PO TABS: ORAL_TABLET | ORAL | 0 refills | Status: DC

## 2019-01-20 MED ORDER — ALBUTEROL SULFATE HFA 108 (90 BASE) MCG/ACT IN AERS
8.0000 | INHALATION_SPRAY | Freq: Once | RESPIRATORY_TRACT | Status: AC
  Administered 2019-01-20: 8 via RESPIRATORY_TRACT
  Filled 2019-01-20: qty 6.7

## 2019-01-20 MED ORDER — PREDNISONE 20 MG PO TABS
60.0000 mg | ORAL_TABLET | Freq: Once | ORAL | Status: AC
  Administered 2019-01-20: 60 mg via ORAL
  Filled 2019-01-20: qty 3

## 2019-01-20 MED ORDER — ALBUTEROL SULFATE (2.5 MG/3ML) 0.083% IN NEBU
5.0000 mg | INHALATION_SOLUTION | Freq: Once | RESPIRATORY_TRACT | Status: AC
  Administered 2019-01-20: 5 mg via RESPIRATORY_TRACT
  Filled 2019-01-20: qty 6

## 2019-01-20 MED ORDER — IPRATROPIUM BROMIDE 0.02 % IN SOLN
0.5000 mg | Freq: Once | RESPIRATORY_TRACT | Status: AC
  Administered 2019-01-20: 0.5 mg via RESPIRATORY_TRACT
  Filled 2019-01-20: qty 2.5

## 2019-01-20 NOTE — ED Provider Notes (Signed)
[facility] EMERGENCY DEPARTMENT Provider Note   CSN: [id number] Arrival date & time: 01/19/19  2049     History   Chief Complaint Chief Complaint  Patient presents with  . Shortness of Breath    HPI Duane Cuevas is a 63 y.o. male.     The history is provided by the patient. The history is limited by the condition of the patient. A language interpreter was used.  Shortness of Breath Severity:  Moderate Onset quality:  Gradual Duration:  1 day Timing:  Constant Progression:  Worsening Chronicity:  New Relieved by:  Nothing Worsened by:  Nothing Associated symptoms: chest pain, cough, sputum production and wheezing   Associated symptoms: no fever, no hemoptysis, no sore throat and no vomiting    Pt with cough/sob for 1 day He quit smoking a month ago No known COVID exposures He reports shoulder and CP with breathing/coughing  Past Medical History:  Diagnosis Date  . Diabetes mellitus ([place]) 10/2014   pt denies being diabetic.   . Emphysema of lung ([place])   . Emphysema/COPD ([place]) 10/2014  . Hepatic steatosis   . Thrombocytopenia ([place]) 09/2015   platelets in 120s.     Patient Active Problem List   Diagnosis Date Noted  . Cigarette smoker 08/15/2018  . COPD ? GOLD III/ active smoker 08/14/2018  . Acute exacerbation of chronic obstructive pulmonary disease ([place]) 06/30/2018  . Tobacco use 06/30/2018  . History of diet-controlled diabetes 06/30/2018  . Abdominal pain   . Gastritis 09/23/2015  . Diabetes mellitus ([place]) 09/23/2015  . Hypokalemia 09/23/2015  . CAP (community acquired pneumonia) 09/19/2015  . COPD (chronic obstructive pulmonary disease) ([place]) 10/14/2014    Past Surgical History:  Procedure Laterality Date  . ESOPHAGOGASTRODUODENOSCOPY N/A 09/23/2015   Procedure: ESOPHAGOGASTRODUODENOSCOPY (EGD);  Surgeon: Ladene Artist, MD;  Location: [facility];  Service: Endoscopy;  Laterality: N/A;  . FOOT SURGERY    . INGUINAL HERNIA  REPAIR Left 05/15/2018   Procedure: LEFT INGUINAL HERNIA REPAIR WITH MESH;  Surgeon: Erroll Luna, MD;  Location: [place];  Service: General;  Laterality: Left;  . INSERTION OF MESH Left 05/15/2018   Procedure: INSERTION OF MESH;  Surgeon: Erroll Luna, MD;  Location: [place];  Service: General;  Laterality: Left;  . UPPER GASTROINTESTINAL ENDOSCOPY          Home Medications    Prior to Admission medications   Medication Sig Start Date End Date Taking? Authorizing Provider  albuterol (PROVENTIL HFA;VENTOLIN HFA) 108 (90 Base) MCG/ACT inhaler Inhale 2 puffs into the lungs every 4 (four) hours as needed for wheezing or shortness of breath. 09/16/18   Cur[place]olo, Adam, DO  budesonide-formoterol (SYMBICORT) 160-4.5 MCG/ACT inhaler Inhale 2 puffs into the lungs 2 (two) times daily. 08/14/18   Tanda Rockers, MD  ondansetron (ZOFRAN) 4 MG tablet Take 1 tablet (4 mg total) by mouth every 6 (six) hours as needed for nausea. Patient not taking: Reported on 09/16/2018 07/02/18   Aline August, MD  OXYGEN Inhale 2 L into the lungs daily as needed (SOB).     [provider]    Family History Family History  Problem Relation Age of Onset  . Emphysema Mother   . Diabetes Mother   . Emphysema Father   . Cancer Sister        Stomach cancer  . Diabetes Sister   . Stomach cancer Sister   . Diabetes Brother   . Colon cancer Neg Hx   .  Colon polyps Neg Hx   . Esophageal cancer Neg Hx   . Rectal cancer Neg Hx     Social History Social History   Tobacco Use  . Smoking status: Current Every Day Smoker    Packs/day: 0.50    Years: 47.00    Pack years: 23.50    Types: Cigarettes  . Smokeless tobacco: Never Used  Substance Use Topics  . Alcohol use: Yes    Comment: occ  . Drug use: No     Allergies   Patient has no known allergies.   Review of Systems Review of Systems  Constitutional: Negative for fever.  HENT: Negative for sore throat.   Respiratory: Positive for cough,  sputum production, shortness of breath and wheezing. Negative for hemoptysis.   Cardiovascular: Positive for chest pain.  Gastrointestinal: Negative for vomiting.  All other systems reviewed and are negative.    Physical Exam Updated Vital Signs BP (!) 152/106 (BP Location: Right Arm)   Pulse 86   Temp 98.3 F (36.8 C)   Resp 20   SpO2 96%   Physical Exam CONSTITUTIONAL: Well developed/well nourished, anxious HEAD: Normocephalic/atraumatic EYES: EOMI ENMT: Mucous membranes moist NECK: supple no meningeal signs SPINE/BACK:entire spine nontender CV: S1/S2 noted, no murmurs/rubs/gallops noted LUNGS: tachypneic/wheezing Chest  - no tenderness/bruising ABDOMEN: soft, nontender, no rebound or guarding, bowel sounds noted throughout abdomen GU:no cva tenderness NEURO: Pt is awake/alert/appropriate, moves all extremitiesx4.  No facial droop.   EXTREMITIES: pulses normal/equal, full ROM, no LE edema SKIN: warm, color normal PSYCH: anxious   ED Treatments / Results  Labs (all labs ordered are listed, but only abnormal results are displayed) Labs Reviewed  BASIC METABOLIC PANEL - Abnormal; Notable for the following components:      Result Value   Sodium 130 (*)    Chloride 93 (*)    Glucose, Bld 144 (*)    All other components within normal limits  CBC - Abnormal; Notable for the following components:   RDW 11.4 (*)    All other components within normal limits  SARS CORONAVIRUS 2 (HOSPITAL ORDER, [facility])    EKG EKG Interpretation  Date/Time:  Sunday January 20 2019 00:51:10 EDT Ventricular Rate:  84 PR Interval:    QRS Duration: 108 QT Interval:  384 QTC Calculation: 010 R Axis:   86 Text Interpretation:  Sinus rhythm Biatrial enlargement Borderline right axis deviation Nonspecific T abnrm, anterolateral leads Confirmed by Ripley Fraise [phone]) on 01/20/2019 12:59:40 AM   Radiology Dg Chest Port 1 View  Result Date: 01/20/2019  CLINICAL DATA:  63 year old male with shortness of breath. EXAM: PORTABLE CHEST 1 VIEW COMPARISON:  Chest radiograph dated 09/16/2018 FINDINGS: Emphysematous changes of the lungs. No focal consolidation, pleural effusion, or pneumothorax. The cardiac silhouette is within normal limits. No acute osseous pathology. IMPRESSION: No active disease. Electronically Signed   By: Anner Crete M.D.   On: 01/20/2019 01:54    Procedures Procedures    Medications Ordered in ED Medications  albuterol (PROVENTIL) (2.5 MG/3ML) 0.083% nebulizer solution 5 mg (has no administration in time range)  ipratropium (ATROVENT) nebulizer solution 0.5 mg (has no administration in time range)  albuterol (VENTOLIN HFA) 108 (90 Base) MCG/ACT inhaler 8 puff (8 puffs Inhalation Given 01/20/19 0152)  AeroChamber Plus Flo-Vu Large MISC 1 each (1 each Other Given 01/20/19 0153)  predniSONE (DELTASONE) tablet 60 mg (60 mg Oral Given 01/20/19 0154)     Initial Impression / Assessment and  Plan / ED Course  I have reviewed the triage vital signs and the nursing notes.  Pertinent labs & imaging results that were available during my care of the patient were reviewed by me and considered in my medical decision making (see chart for details).        Patient presents with probable COPD exacerbation.  He is COVID negative.  He has some improvement with initial meds, but will require nebulizers.  4:40 AM Pt improved D/w interpreter [phone] 5:04 AM Patient improved, he without difficulty.  He reports his chest pain is improved.  He is in no distress.  Will discharge home.  Low suspicion for ACS or PE at this time  Levonte Molina was evaluated in Emergency Department on 01/20/2019 for the symptoms described in the history of present illness. He was evaluated in the context of the global COVID-19 pandemic, which necessitated consideration that the patient might be at risk for infection with the SARS-CoV-2 virus that causes  COVID-19. Institutional protocols and algorithms that pertain to the evaluation of patients at risk for COVID-19 are in a state of rapid change based on information released by regulatory bodies including the CDC and federal and state organizations. These policies and algorithms were followed during the patient's care in the ED.   Final Clinical Impressions(s) / ED Diagnoses   Final diagnoses:  COPD exacerbation [facility])    ED Discharge Orders         Ordered    predniSONE (DELTASONE) 50 MG tablet     01/20/19 0442    albuterol (VENTOLIN HFA) 108 (90 Base) MCG/ACT inhaler  Every 6 hours PRN     01/20/19 0442           Ripley Fraise, MD 01/20/19 0505

## 2019-01-20 NOTE — ED Notes (Signed)
Checked Pulse Oximetry while ambulating, O2 stayed between 92%-93%.

## 2019-02-09 ENCOUNTER — Emergency Department (HOSPITAL_COMMUNITY)
Admission: EM | Admit: 2019-02-09 | Discharge: 2019-02-10 | Disposition: A | Discharge status: Home / Self Care | Payer: Self-pay | Attending: Emergency Medicine | Admitting: Emergency Medicine

## 2019-02-09 ENCOUNTER — Other Ambulatory Visit: Payer: Self-pay

## 2019-02-09 ENCOUNTER — Emergency Department (HOSPITAL_COMMUNITY): Payer: Self-pay

## 2019-02-09 ENCOUNTER — Encounter (HOSPITAL_COMMUNITY): Payer: Self-pay | Admitting: Emergency Medicine

## 2019-02-09 DIAGNOSIS — F17210 Nicotine dependence, cigarettes, uncomplicated: Secondary | ICD-10-CM | POA: Insufficient documentation

## 2019-02-09 DIAGNOSIS — J441 Chronic obstructive pulmonary disease with (acute) exacerbation: Secondary | ICD-10-CM | POA: Insufficient documentation

## 2019-02-09 DIAGNOSIS — Z79899 Other long term (current) drug therapy: Secondary | ICD-10-CM | POA: Insufficient documentation

## 2019-02-09 DIAGNOSIS — R05 Cough: Secondary | ICD-10-CM | POA: Insufficient documentation

## 2019-02-09 DIAGNOSIS — E119 Type 2 diabetes mellitus without complications: Secondary | ICD-10-CM | POA: Insufficient documentation

## 2019-02-09 DIAGNOSIS — Z20828 Contact with and (suspected) exposure to other viral communicable diseases: Secondary | ICD-10-CM | POA: Insufficient documentation

## 2019-02-09 LAB — CBC WITH DIFFERENTIAL/PLATELET
Abs Immature Granulocytes: 0.04 10*3/uL (ref 0.00–0.07)
Basophils Absolute: 0 10*3/uL (ref 0.0–0.1)
Basophils Relative: 1 %
Eosinophils Absolute: 0.3 10*3/uL (ref 0.0–0.5)
Eosinophils Relative: 4 %
HCT: 46.5 % (ref 39.0–52.0)
Hemoglobin: 16.3 g/dL (ref 13.0–17.0)
Immature Granulocytes: 1 %
Lymphocytes Relative: 21 %
Lymphs Abs: 1.2 10*3/uL (ref 0.7–4.0)
MCH: 32.5 pg (ref 26.0–34.0)
MCHC: 35.1 g/dL (ref 30.0–36.0)
MCV: 92.8 fL (ref 80.0–100.0)
Monocytes Absolute: 0.7 10*3/uL (ref 0.1–1.0)
Monocytes Relative: 11 %
Neutro Abs: 3.7 10*3/uL (ref 1.7–7.7)
Neutrophils Relative %: 62 %
Platelets: 241 10*3/uL (ref 150–400)
RBC: 5.01 MIL/uL (ref 4.22–5.81)
RDW: 12.7 % (ref 11.5–15.5)
WBC: 6 10*3/uL (ref 4.0–10.5)
nRBC: 0 % (ref 0.0–0.2)

## 2019-02-09 LAB — BASIC METABOLIC PANEL
Anion gap: 10 (ref 5–15)
BUN: 13 mg/dL (ref 8–23)
CO2: 26 mmol/L (ref 22–32)
Calcium: 9.3 mg/dL (ref 8.9–10.3)
Chloride: 97 mmol/L — ABNORMAL LOW (ref 98–111)
Creatinine, Ser: 0.82 mg/dL (ref 0.61–1.24)
GFR calc Af Amer: >60 mL/min (ref >60)
GFR calc non Af Amer: >60 mL/min (ref >60)
Glucose, Bld: 101 mg/dL — ABNORMAL HIGH (ref 70–99)
Potassium: 3.9 mmol/L (ref 3.5–5.1)
Sodium: 133 mmol/L — ABNORMAL LOW (ref 135–145)

## 2019-02-09 LAB — SARS CORONAVIRUS 2 BY RT PCR (HOSPITAL ORDER, PERFORMED IN ~~LOC~~ HOSPITAL LAB): SARS Coronavirus 2: NEGATIVE

## 2019-02-09 MED ORDER — ALBUTEROL SULFATE HFA 108 (90 BASE) MCG/ACT IN AERS
8.0000 | INHALATION_SPRAY | RESPIRATORY_TRACT | Status: AC
  Administered 2019-02-09 (×3): 8 via RESPIRATORY_TRACT
  Filled 2019-02-09: qty 6.7

## 2019-02-09 MED ORDER — IPRATROPIUM BROMIDE HFA 17 MCG/ACT IN AERS
2.0000 | INHALATION_SPRAY | RESPIRATORY_TRACT | Status: AC
  Administered 2019-02-09 (×3): 2 via RESPIRATORY_TRACT
  Filled 2019-02-09: qty 12.9

## 2019-02-09 MED ORDER — ALBUTEROL SULFATE (2.5 MG/3ML) 0.083% IN NEBU
5.0000 mg | INHALATION_SOLUTION | Freq: Once | RESPIRATORY_TRACT | Status: AC
  Administered 2019-02-09: 5 mg via RESPIRATORY_TRACT
  Filled 2019-02-09: qty 6

## 2019-02-09 MED ORDER — PREDNISONE 20 MG PO TABS
60.0000 mg | ORAL_TABLET | Freq: Once | ORAL | Status: AC
  Administered 2019-02-09: 60 mg via ORAL
  Filled 2019-02-09: qty 3

## 2019-02-09 NOTE — ED Provider Notes (Signed)
[facility] EMERGENCY DEPARTMENT Provider Note   CSN: [id number] Arrival date & time: 02/09/19  1748     History   Chief Complaint Chief Complaint  Patient presents with  . Shortness of Breath    HPI Duane Cuevas is a 63 y.o. male.     Patient with history of emphysema/COPD presents the emergency department with worsening shortness of breath over the past 2 days but becoming worse.  Patient reports wheezing and cough as well.  No fevers or URI symptoms.  No known sick contacts or coronavirus contacts.  Patient was last seen in the emergency department on 01/20/2019 was discharged home after he improved.  He states that he was given an inhaler and 4 additional days of "white pills".  He denies routine treatments for his breathing.  No chest pain, nausea, vomiting, or diarrhea.  No lower extremity swelling.  Symptoms are similar to previous when his emphysema gets worse.  Cough is nonproductive.     Past Medical History:  Diagnosis Date  . Diabetes mellitus ([place]) 10/2014   pt denies being diabetic.   . Emphysema of lung ([place])   . Emphysema/COPD ([place]) 10/2014  . Hepatic steatosis   . Thrombocytopenia ([place]) 09/2015   platelets in 120s.     Patient Active Problem List   Diagnosis Date Noted  . Cigarette smoker 08/15/2018  . COPD ? GOLD III/ active smoker 08/14/2018  . Acute exacerbation of chronic obstructive pulmonary disease ([place]) 06/30/2018  . Tobacco use 06/30/2018  . History of diet-controlled diabetes 06/30/2018  . Abdominal pain   . Gastritis 09/23/2015  . Diabetes mellitus ([place]) 09/23/2015  . Hypokalemia 09/23/2015  . CAP (community acquired pneumonia) 09/19/2015  . COPD (chronic obstructive pulmonary disease) ([place]) 10/14/2014    Past Surgical History:  Procedure Laterality Date  . ESOPHAGOGASTRODUODENOSCOPY N/A 09/23/2015   Procedure: ESOPHAGOGASTRODUODENOSCOPY (EGD);  Surgeon: Ladene Artist, MD;  Location: [facility];  Service:  Endoscopy;  Laterality: N/A;  . FOOT SURGERY    . INGUINAL HERNIA REPAIR Left 05/15/2018   Procedure: LEFT INGUINAL HERNIA REPAIR WITH MESH;  Surgeon: Erroll Luna, MD;  Location: [facility];  Service: General;  Laterality: Left;  . INSERTION OF MESH Left 05/15/2018   Procedure: INSERTION OF MESH;  Surgeon: Erroll Luna, MD;  Location: [facility];  Service: General;  Laterality: Left;  . UPPER GASTROINTESTINAL ENDOSCOPY          Home Medications    Prior to Admission medications   Medication Sig Start Date End Date Taking? Authorizing Provider  albuterol (VENTOLIN HFA) 108 (90 Base) MCG/ACT inhaler Inhale 1-2 puffs into the lungs every 6 (six) hours as needed for wheezing or shortness of breath. 01/20/19   Ripley Fraise, MD  budesonide-formoterol [facility]) 160-4.5 MCG/ACT inhaler Inhale 2 puffs into the lungs 2 (two) times daily. 08/14/18   Tanda Rockers, MD  OXYGEN Inhale 2 L into the lungs daily as needed (SOB).     [provider]  predniSONE (DELTASONE) 50 MG tablet One tablet po daily for 4 days 01/20/19   Ripley Fraise, MD    Family History Family History  Problem Relation Age of Onset  . Emphysema Mother   . Diabetes Mother   . Emphysema Father   . Cancer Sister        Stomach cancer  . Diabetes Sister   . Stomach cancer Sister   . Diabetes Brother   . Colon cancer Neg Hx   . Colon polyps  Neg Hx   . Esophageal cancer Neg Hx   . Rectal cancer Neg Hx     Social History Social History   Tobacco Use  . Smoking status: Current Every Day Smoker    Packs/day: 0.50    Years: 47.00    Pack years: 23.50    Types: Cigarettes  . Smokeless tobacco: Never Used  Substance Use Topics  . Alcohol use: Yes    Comment: occ  . Drug use: No     Allergies   Patient has no known allergies.   Review of Systems Review of Systems  Constitutional: Negative for fever.  HENT: Negative for rhinorrhea and sore throat.   Eyes: Negative for redness.  Respiratory:  Positive for cough, chest tightness, shortness of breath and wheezing.   Cardiovascular: Negative for chest pain and leg swelling.  Gastrointestinal: Negative for abdominal pain, diarrhea, nausea and vomiting.  Genitourinary: Negative for dysuria.  Musculoskeletal: Negative for myalgias.  Skin: Negative for rash.  Neurological: Negative for headaches.     Physical Exam Updated Vital Signs BP 128/78   Pulse (!) 102   Temp 98.3 F (36.8 C)   Resp (!) 24   SpO2 95%   Physical Exam Vitals signs and nursing note reviewed.  Constitutional:      Appearance: He is well-developed.  HENT:     Head: Normocephalic and atraumatic.  Eyes:     General:        Right eye: No discharge.        Left eye: No discharge.     Conjunctiva/sclera: Conjunctivae normal.  Neck:     Musculoskeletal: Normal range of motion and neck supple.  Cardiovascular:     Rate and Rhythm: Normal rate and regular rhythm.     Heart sounds: Normal heart sounds.  Pulmonary:     Effort: Tachypnea and accessory muscle usage present.     Breath sounds: Examination of the right-upper field reveals decreased breath sounds. Examination of the left-upper field reveals decreased breath sounds. Examination of the right-middle field reveals decreased breath sounds. Examination of the left-middle field reveals decreased breath sounds. Examination of the right-lower field reveals decreased breath sounds. Examination of the left-lower field reveals decreased breath sounds. Decreased breath sounds and wheezing present.     Comments: Patient with decreased breath sounds and wheezing throughout.  Prolonged expiratory phase.  Positive accessory muscle use. Abdominal:     Palpations: Abdomen is soft.     Tenderness: There is no abdominal tenderness.  Skin:    General: Skin is warm and dry.  Neurological:     Mental Status: He is alert.      ED Treatments / Results  Labs (all labs ordered are listed, but only abnormal results  are displayed) Labs Reviewed  BASIC METABOLIC PANEL - Abnormal; Notable for the following components:      Result Value   Sodium 133 (*)    Chloride 97 (*)    Glucose, Bld 101 (*)    All other components within normal limits  SARS CORONAVIRUS 2 (HOSPITAL ORDER, [facility])  CBC WITH DIFFERENTIAL/PLATELET    EKG None  Radiology Dg Chest Portable 1 View  Result Date: 02/09/2019 CLINICAL DATA:  Shortness of breath. EXAM: PORTABLE CHEST 1 VIEW COMPARISON:  January 20, 2019 FINDINGS: Cardiomediastinal silhouette is normal. Mediastinal contours appear intact. Calcific atherosclerotic disease of the aorta. There is no evidence of focal airspace consolidation, pleural effusion or pneumothorax. Osseous structures are  without acute abnormality. Soft tissues are grossly normal. IMPRESSION: No active disease. Electronically Signed   By: Fidela Salisbury M.D.   On: 02/09/2019 20:07    Procedures Procedures (including critical care time)  Medications Ordered in ED Medications  albuterol (VENTOLIN HFA) 108 (90 Base) MCG/ACT inhaler 8 puff (8 puffs Inhalation Given 02/09/19 2140)  ipratropium (ATROVENT HFA) inhaler 2 puff (2 puffs Inhalation Given 02/09/19 2140)  predniSONE (DELTASONE) tablet 60 mg (60 mg Oral Given 02/09/19 2032)  albuterol (PROVENTIL) (2.5 MG/3ML) 0.083% nebulizer solution 5 mg (5 mg Nebulization Given 02/09/19 2240)     Initial Impression / Assessment and Plan / ED Course  I have reviewed the triage vital signs and the nursing notes.  Pertinent labs & imaging results that were available during my care of the patient were reviewed by me and considered in my medical decision making (see chart for details).        Patient seen and examined. Work-up initiated. Medications ordered.  Will need to reassess patient.  May need admission if symptoms are not improving.  At this time he has increased work of breathing and significant wheezing.  Will check labs,  chest x-ray, EKG, COVID-19 testing.  Albuterol, Atrovent, prednisone ordered.  Vital signs reviewed and are as follows: BP 128/78   Pulse (!) 102   Temp 98.3 F (36.8 C)   Resp (!) 24   SpO2 95%   9:47 PM patient states that he is feeling a bit better after 3 inhaler treatments.  He appears clinically improved.  Resolution of accessory muscle use.  Wheezing present but much improved with good air movement.  Will give additional albuterol nebulizer in light of negative COVID testing.  Will reassess.  Hopefully patient will be stable enough for discharge to home.  12:07 AM near resolution of wheezing.  Patient appears very well.  He states that he his feeling well enough to go home.  He will follow-up with his doctor next week.  Will prescribe 4 additional days of prednisone.  He will go home with his albuterol inhaler.  Discussed appropriate use.  Encouraged return to the emergency department with fever, worsening shortness of breath, chest pain, new symptoms or other concerns.  Final Clinical Impressions(s) / ED Diagnoses   Final diagnoses:  COPD exacerbation ([place])   Patient with COPD exacerbation with symptoms consistent with previous.  Treated with albuterol in the emergency department with gradual and near complete improvement.  Lab work-up and COVID testing, chest x-ray, reassuring.  Patient is well enough for discharge at this time.  ED Discharge Orders         Ordered    predniSONE (DELTASONE) 20 MG tablet  Daily     02/10/19 0002           Carlisle Cater, PA-C 02/10/19 0008    Pattricia Boss, MD 02/11/19 1359

## 2019-02-09 NOTE — ED Triage Notes (Signed)
Pt is Spanish speaking. Pt reports shortness of breath since yesterday, worsening today. Pt reports hx of emphysema. Pt denies CP. Pt reports some non-productive cough. Pt denies fever or sore throat.

## 2019-02-10 MED ORDER — PREDNISONE 20 MG PO TABS: 40.0000 mg | ORAL_TABLET | Freq: Every day | ORAL | 0 refills | Status: DC

## 2019-02-10 NOTE — Discharge Instructions (Signed)
Please read and follow all provided instructions.  Your diagnoses today include:  1. COPD exacerbation ([place])     Tests performed today include:  Chest x-ray - did not show pneumonia  Blood counts and electrolytes - were okay  Corona virus test - was negative  Vital signs. See below for your results today.   Medications prescribed:   Albuterol inhaler - medication that opens up your airway  Use inhaler as follows: 1-2 puffs with spacer every 4 hours as needed for wheezing, cough, or shortness of breath.    Prednisone - steroid medicine   It is best to take this medication in the morning to prevent sleeping problems. If you are diabetic, monitor your blood sugar closely and stop taking Prednisone if blood sugar is over 300. Take with food to prevent stomach upset.   Take any prescribed medications only as directed.  Home care instructions:  Follow any educational materials contained in this packet.  Follow-up instructions: Please follow-up with your primary care provider in the next 3 days for further evaluation of your symptoms and management of your COPD.   Return instructions:   Please return to the Emergency Department if you experience worsening symptoms.  Please return with worsening wheezing, shortness of breath, or difficulty breathing.  Return with persistent fever above 101F.   Please return if you have any other emergent concerns.  Additional Information:  Your vital signs today were: BP 128/78    Pulse 80    Temp 98.3 F (36.8 C)    Resp 19    SpO2 97%  If your blood pressure (BP) was elevated above 135/85 this visit, please have this repeated by your doctor within one month. --------------

## 2019-02-20 ENCOUNTER — Other Ambulatory Visit: Payer: Self-pay

## 2019-02-20 ENCOUNTER — Ambulatory Visit (INDEPENDENT_AMBULATORY_CARE_PROVIDER_SITE_OTHER): Payer: Self-pay | Admitting: Emergency Medicine

## 2019-02-20 ENCOUNTER — Encounter: Payer: Self-pay | Admitting: Emergency Medicine

## 2019-02-20 VITALS — BP 130/82 | HR 91 | Temp 98.6°F | Ht 68.0 in | Wt 156.0 lb

## 2019-02-20 DIAGNOSIS — D171 Benign lipomatous neoplasm of skin and subcutaneous tissue of trunk: Secondary | ICD-10-CM

## 2019-02-20 DIAGNOSIS — J449 Chronic obstructive pulmonary disease, unspecified: Secondary | ICD-10-CM

## 2019-02-20 DIAGNOSIS — Z87898 Personal history of other specified conditions: Secondary | ICD-10-CM

## 2019-02-20 NOTE — Patient Instructions (Addendum)
If you have lab work done today you will be contacted with your lab results within the next 2 weeks.  If you have not heard from [place] then please contact us. The fastest way to get your results is to register for My Chart.   IF you received an x-ray today, you will receive an invoice from [facility]. Please contact [facility] at [phone] with questions or concerns regarding your invoice.   IF you received labwork today, you will receive an invoice from [facility]. Please contact [facility] at [phone] with questions or concerns regarding your invoice.   Our billing staff will not be able to assist you with questions regarding bills from these companies.  You will be contacted with the lab results as soon as they are available. The fastest way to get your results is to activate your My Chart account. Instructions are located on the last page of this paperwork. If you have not heard from [place] regarding the results in 2 weeks, please contact this office.      Enfermedad pulmonar obstructiva crnica Chronic Obstructive Pulmonary Disease La enfermedad pulmonar obstructiva crnica (EPOC) es un problema pulmonar crnico de larga duracin. La EPOC dificulta la entrada y salida de aire de los pulmones. Por lo general, la enfermedad empeora con [employer], y los pulmones nunca volvern a la normalidad. Puede hacer algunas cosas para mantenerse lo ms sano posible.  El mdico puede tratar la enfermedad con: ? Medicamentos. ? Oxgeno. ? Ciruga pulmonar.  El mdico tambin puede recomendarle: ? Rehabilitacin. Esto incluye medidas para que el cuerpo funcione mejor. Puede requerir la participacin de un equipo de especialistas. ? Si fuma, dejar de hacerlo. ? Ejercicio y [employer] dieta. ? Medidas para el bienestar (cuidados paliativos). Siga estas indicaciones en su casa: Medicamentos  [employer] de venta libre y los recetados solamente como se lo haya  indicado el mdico.  Hable con el mdico antes de tomar cualquier medicamento para la tos o las [place]. Es posible que Product/process development scientist los medicamentos que hagan que los pulmones se sequen. Estilo de vida  Si fuma, deje de hacerlo. Fumar empeora el problema. Si necesita ayuda para dejar de fumar, consulte al mdico.  Evite rodearse de factores que empeoran su respiracin. Por ejemplo, humo, productos qumicos y vapores.  Mantngase activo, pero recuerde que tambin Administrator.  Aprenda y [employer] consejos sobre cmo [place].  Asegrese de dormir lo suficiente. La [employer] de los adultos necesitan dormir 7horas como mnimo todas las noches.  Consuma alimentos saludables. Consuma pequeas cantidades de alimentos con mayor frecuencia. Descanse antes de las comidas. Respiracin controlada Aprenda y utilice consejos sobre cmo controlar su respiracin segn lo aconsejado por su mdico. Intente lo siguiente:  Inspire (inhalar) por la nariz durante 1segundo. Luego frunza los labios y espire (exhale) a travs de los labios durante 2segundos.  Coloque una mano sobre el vientre (abdomen). Inhale lentamente por la nariz durante 1segundo. La mano que est sobre el vientre debe moverse. Frunza los labios y espire lentamente por los labios. La mano que est sobre su vientre debe moverse cuando exhale. Tos controlada Aprenda a utilizar la tos controlada para despejar la mucosidad de los pulmones. Siga estos pasos: 1. Incline la cabeza un poco hacia adelante. 2. Inhale profundamente. 3. Trate de mantener el aire por 3 segundos. 4. Mantenga la boca ligeramente abierta mientras tose 2veces. 5. Escupa la mucosidad en un pauelo. 6. Descanse y repita los pasos  1 o 2 veces, segn lo necesite.  Instrucciones generales  Asegrese de recibir todas las vacunas que el mdico le recomiende. Consulte al mdico acerca de la vacuna contra la gripe y la vacuna contra la neumona.  Use la  oxigenoterapia y la rehabilitacin pulmonar si as se lo indica el mdico. Si necesita oxigenoterapia en su casa, pregntele al mdico si debe comprar una herramienta para medir su nivel de oxgeno (oxmetro).  Realice un plan de accin para la EPOC junto a su mdico. Esto lo ayudar a saber qu debe hacer si se siente peor que de costumbre.  Mantenga bajo control cualquier otra afeccin que padezca segn las indicaciones del mdico.  Evite salir cuando hace mucho calor, fro o humedad.  Evite a las personas que tengan una enfermedad contagiosa.  Concurra a todas las visitas de control como se lo haya indicado el mdico. Esto es importante. Comunquese con un mdico si:  Tose y elimina ms mucosidad que lo habitual.  Hay un cambio en el color o en la consistencia del moco.  Le cuesta respirar ms de lo normal.  La respiracin es ms rpida que lo habitual.  Tiene dificultad para dormir.  Debe usar sus medicamentos con ms frecuencia que lo habitual.  Tiene dificultad para [employer] cotidianas, como vestirse o caminar por la casa. Solicite ayuda de inmediato si:  Tiene dificultad para Chemical engineer.  Tiene dificultad para respirar y [employer] hacer lo siguiente: ? Poder hablar. ? Hacer actividades normales.  Le duele el pecho durante ms de 21minutos.  Tiene la piel ms [employer] lo normal.  El pulsioxmetro muestra que tiene un nivel bajo de oxgeno durante ms de 23minutos.  Tiene fiebre.  Se siente demasiado cansado para respirar con normalidad. Resumen  La enfermedad pulmonar obstructiva crnica (EPOC) es un problema pulmonar de larga duracin.  El funcionamiento de los pulmones nunca volver a la normalidad. Por lo general, la enfermedad empeora con el tiempo. Puede hacer algunas cosas para mantenerse lo ms sano posible.  Tome los medicamentos de venta libre y los recetados solamente como se lo haya indicado el mdico.  Si  fuma, deje de [place]. Fumar empeora el problema. Esta informacin no tiene Marine scientist el consejo del mdico. Asegrese de hacerle al mdico cualquier pregunta que tenga. Document Released: 10/15/2010 Document Revised: 02/02/2017 Document Reviewed: 02/02/2017 Elsevier Patient Education  2020 [employer].

## 2019-02-20 NOTE — Progress Notes (Signed)
Duane Cuevas 63 y.o.   Chief Complaint  Patient presents with  . Health condition    COPD- possible paperwork  . right neck bluge    sometimes painful. starting to grow. has been for 10+ years    HISTORY OF PRESENT ILLNESS: This is a 63 y.o. male with history of COPD and recent ED visit on 02/09/2019 here for follow-up.  Last visit with me was last September 2019.  Doing well today.  Just finished prednisone short course.  Recent chest x-ray reviewed with patient in the room.  COPD changes.  No acute active disease. Inquiring about disability paperwork.  Referred to Social Security disability office. Also wants me to look at a slowly growing soft tissue mass on his left upper back.  Not tender and not infected. 02/09/2019 ED visit: 63 year old man history of COPD presents today with cough and wheezing.  Here in ED patient received steroids and bronchodilators.  Chest x-ray without evidence of acute pneumonia.  COVID test is negative.  Patient improved here in ED with treatment and appears stable for discharge. HPI   Prior to Admission medications   Medication Sig Start Date End Date Taking? Authorizing Provider  albuterol (VENTOLIN HFA) 108 (90 Base) MCG/ACT inhaler Inhale 1-2 puffs into the lungs every 6 (six) hours as needed for wheezing or shortness of breath. 01/20/19  Yes Ripley Fraise, MD  budesonide-formoterol [facility]) 160-4.5 MCG/ACT inhaler Inhale 2 puffs into the lungs 2 (two) times daily. 08/14/18  Yes Tanda Rockers, MD  Multiple Vitamin (MULTIVITAMIN) capsule Take 1 capsule by mouth daily.   Yes [provider]  OXYGEN Inhale 2 L into the lungs daily as needed (SOB).    Yes [provider]  predniSONE (DELTASONE) 20 MG tablet Take 2 tablets (40 mg total) by mouth daily. 02/10/19  Yes Carlisle Cater, PA-C    No Known Allergies  Patient Active Problem List   Diagnosis Date Noted  . Cigarette smoker 08/15/2018  . COPD ? GOLD III/ active smoker  08/14/2018  . History of diet-controlled diabetes 06/30/2018  . Diabetes mellitus ([place]) 09/23/2015  . COPD (chronic obstructive pulmonary disease) ([place]) 10/14/2014    Past Medical History:  Diagnosis Date  . Diabetes mellitus ([place]) 10/2014   pt denies being diabetic.   . Emphysema of lung ([place])   . Emphysema/COPD ([place]) 10/2014  . Hepatic steatosis   . Thrombocytopenia ([place]) 09/2015   platelets in 120s.     Past Surgical History:  Procedure Laterality Date  . ESOPHAGOGASTRODUODENOSCOPY N/A 09/23/2015   Procedure: ESOPHAGOGASTRODUODENOSCOPY (EGD);  Surgeon: Ladene Artist, MD;  Location: [facility];  Service: Endoscopy;  Laterality: N/A;  . FOOT SURGERY    . INGUINAL HERNIA REPAIR Left 05/15/2018   Procedure: LEFT INGUINAL HERNIA REPAIR WITH MESH;  Surgeon: Erroll Luna, MD;  Location: [facility];  Service: General;  Laterality: Left;  . INSERTION OF MESH Left 05/15/2018   Procedure: INSERTION OF MESH;  Surgeon: Erroll Luna, MD;  Location: [facility];  Service: General;  Laterality: Left;  . UPPER GASTROINTESTINAL ENDOSCOPY      Social History   Socioeconomic History  . Marital status: Married    Spouse name: Ramonita Lab  . Number of children: 6  . Years of education: Not on file  . Highest education level: Not on file  Occupational History  . Occupation: Employed    Fish farm manager: [employer]  Social Needs  . Financial resource strain: Not on file  . Food insecurity  Worry: Not on file    Inability: Not on file  . Transportation needs    Medical: Not on file    Non-medical: Not on file  Tobacco Use  . Smoking status: Current Every Day Smoker    Packs/day: 0.50    Years: 47.00    Pack years: 23.50    Types: Cigarettes  . Smokeless tobacco: Never Used  Substance and Sexual Activity  . Alcohol use: Yes    Comment: occ  . Drug use: No  . Sexual activity: Never  Lifestyle  . Physical activity    Days per week: Not on file    Minutes per session: Not on file  .  Stress: Not on file  Relationships  . Social Herbalist on phone: Not on file    Gets together: Not on file    Attends religious service: Not on file    Active member of club or organization: Not on file    Attends meetings of clubs or organizations: Not on file    Relationship status: Not on file  . Intimate partner violence    Fear of current or ex partner: Not on file    Emotionally abused: Not on file    Physically abused: Not on file    Forced sexual activity: Not on file  Other Topics Concern  . Not on file  Social History Narrative   Lives with wife.  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    Family History  Problem Relation Age of Onset  . Emphysema Mother   . Diabetes Mother   . Emphysema Father   . Cancer Sister        Stomach cancer  . Diabetes Sister   . Stomach cancer Sister   . Diabetes Brother   . Colon cancer Neg Hx   . Colon polyps Neg Hx   . Esophageal cancer Neg Hx   . Rectal cancer Neg Hx      Review of Systems  Constitutional: Negative.  Negative for chills and fever.  HENT: Negative.   Eyes: Negative.   Respiratory: Positive for shortness of breath (Mostly on exertion).   Cardiovascular: Negative for chest pain and palpitations.  Gastrointestinal: Negative for abdominal pain, diarrhea, nausea and vomiting.  Genitourinary: Negative.   Skin: Negative.   Neurological: Negative.  Negative for dizziness and headaches.  Endo/Heme/Allergies: Negative.   All other systems reviewed and are negative.  Vitals:   02/20/19 1049  BP: 130/82  Pulse: 91  Temp: 98.6 F (37 C)  SpO2: 97%     Physical Exam Vitals signs reviewed.  Constitutional:      Appearance: Normal appearance.  HENT:     Head: Normocephalic and atraumatic.     Nose: Nose normal.  Eyes:     Extraocular Movements: Extraocular movements intact.     Pupils: Pupils are equal, round, and reactive to light.  Neck:     Musculoskeletal: Normal range of motion and  neck supple.  Cardiovascular:     Rate and Rhythm: Normal rate and regular rhythm.     Heart sounds: Normal heart sounds.  Pulmonary:     Effort: Pulmonary effort is normal.     Breath sounds: Normal breath sounds.  Abdominal:     Palpations: Abdomen is soft.     Tenderness: There is no abdominal tenderness.  Musculoskeletal: Normal range of motion.  Skin:    General: Skin is warm and dry.  Capillary Refill: Capillary refill takes less than 2 seconds.     Comments: Positive nontender nonfluctuant lipoma/sebaceous cyst on left upper back.  Neurological:     General: No focal deficit present.     Mental Status: He is alert and oriented to person, place, and time.  Psychiatric:        Mood and Affect: Mood normal.        Behavior: Behavior normal.      ASSESSMENT & PLAN: [place] was seen today for health condition and right neck bluge.  Diagnoses and all orders for this visit:  Chronic obstructive pulmonary disease, unspecified COPD type ([place])  Lipoma of back  History of prediabetes    Clinically stable.  No medical concerns identified during this visit. Disability concerns addressed.  Referred to Social Security disability office for further evaluation. Continue present medications.  Follow-up with pulmonary doctor as well.     Patient Instructions       If you have lab work done today you will be contacted with your lab results within the next 2 weeks.  If you have not heard from [place] then please contact us. The fastest way to get your results is to register for My Chart.   IF you received an x-ray today, you will receive an invoice from [facility]. Please contact [facility] at [phone] with questions or concerns regarding your invoice.   IF you received labwork today, you will receive an invoice from [facility]. Please contact [facility] at [phone] with questions or concerns regarding your invoice.   Our billing staff will not be able  to assist you with questions regarding bills from these companies.  You will be contacted with the lab results as soon as they are available. The fastest way to get your results is to activate your My Chart account. Instructions are located on the last page of this paperwork. If you have not heard from [place] regarding the results in 2 weeks, please contact this office.      Enfermedad pulmonar obstructiva crnica Chronic Obstructive Pulmonary Disease La enfermedad pulmonar obstructiva crnica (EPOC) es un problema pulmonar crnico de larga duracin. La EPOC dificulta la entrada y salida de aire de los pulmones. Por lo general, la enfermedad empeora con [employer], y los pulmones nunca volvern a la normalidad. Puede hacer algunas cosas para mantenerse lo ms sano posible.  El mdico puede tratar la enfermedad con: ? Medicamentos. ? Oxgeno. ? Ciruga pulmonar.  El mdico tambin puede recomendarle: ? Rehabilitacin. Esto incluye medidas para que el cuerpo funcione mejor. Puede requerir la participacin de un equipo de especialistas. ? Si fuma, dejar de hacerlo. ? Ejercicio y [employer] dieta. ? Medidas para el bienestar (cuidados paliativos). Siga estas indicaciones en su casa: Medicamentos  [employer] de venta libre y los recetados solamente como se lo haya indicado el mdico.  Hable con el mdico antes de tomar cualquier medicamento para la tos o las [place]. Es posible que Product/process development scientist los medicamentos que hagan que los pulmones se sequen. Estilo de vida  Si fuma, deje de hacerlo. Fumar empeora el problema. Si necesita ayuda para dejar de fumar, consulte al mdico.  Evite rodearse de factores que empeoran su respiracin. Por ejemplo, humo, productos qumicos y vapores.  Mantngase activo, pero recuerde que tambin Administrator.  Aprenda y [employer] consejos sobre cmo [place].  Asegrese de dormir lo suficiente. La [employer] de los adultos necesitan dormir  7horas como mnimo todas las  noches.  Consuma alimentos saludables. Consuma pequeas cantidades de alimentos con mayor frecuencia. Descanse antes de las comidas. Respiracin controlada Aprenda y utilice consejos sobre cmo controlar su respiracin segn lo aconsejado por su mdico. Intente lo siguiente:  Inspire (inhalar) por la nariz durante 1segundo. Luego frunza los labios y espire (exhale) a travs de los labios durante 2segundos.  Coloque una mano sobre el vientre (abdomen). Inhale lentamente por la nariz durante 1segundo. La mano que est sobre el vientre debe moverse. Frunza los labios y espire lentamente por los labios. La mano que est sobre su vientre debe moverse cuando exhale. Tos controlada Aprenda a utilizar la tos controlada para despejar la mucosidad de los pulmones. Siga estos pasos: 1. Incline la cabeza un poco hacia adelante. 2. Inhale profundamente. 3. Trate de mantener el aire por 3 segundos. 4. Mantenga la boca ligeramente abierta mientras tose 2veces. 5. Escupa la mucosidad en un pauelo. 6. Descanse y repita los pasos 1 o 2 veces, segn lo necesite.  Instrucciones generales  Asegrese de recibir todas las vacunas que el mdico le recomiende. Consulte al mdico acerca de la vacuna contra la gripe y la vacuna contra la neumona.  Use la oxigenoterapia y la rehabilitacin pulmonar si as se lo indica el mdico. Si necesita oxigenoterapia en su casa, pregntele al mdico si debe comprar una herramienta para medir su nivel de oxgeno (oxmetro).  Realice un plan de accin para la EPOC junto a su mdico. Esto lo ayudar a saber qu debe hacer si se siente peor que de costumbre.  Mantenga bajo control cualquier otra afeccin que padezca segn las indicaciones del mdico.  Evite salir cuando hace mucho calor, fro o humedad.  Evite a las personas que tengan una enfermedad contagiosa.  Concurra a todas las visitas de control como se lo haya indicado el mdico. Esto  es importante. Comunquese con un mdico si:  Tose y elimina ms mucosidad que lo habitual.  Hay un cambio en el color o en la consistencia del moco.  Le cuesta respirar ms de lo normal.  La respiracin es ms rpida que lo habitual.  Tiene dificultad para dormir.  Debe usar sus medicamentos con ms frecuencia que lo habitual.  Tiene dificultad para [employer] cotidianas, como vestirse o caminar por la casa. Solicite ayuda de inmediato si:  Tiene dificultad para Chemical engineer.  Tiene dificultad para respirar y [employer] hacer lo siguiente: ? Poder hablar. ? Hacer actividades normales.  Le duele el pecho durante ms de 6minutos.  Tiene la piel ms [employer] lo normal.  El pulsioxmetro muestra que tiene un nivel bajo de oxgeno durante ms de 13minutos.  Tiene fiebre.  Se siente demasiado cansado para respirar con normalidad. Resumen  La enfermedad pulmonar obstructiva crnica (EPOC) es un problema pulmonar de larga duracin.  El funcionamiento de los pulmones nunca volver a la normalidad. Por lo general, la enfermedad empeora con el tiempo. Puede hacer algunas cosas para mantenerse lo ms sano posible.  Tome los medicamentos de venta libre y los recetados solamente como se lo haya indicado el mdico.  Si fuma, deje de [place]. Fumar empeora el problema. Esta informacin no tiene Marine scientist el consejo del mdico. Asegrese de hacerle al mdico cualquier pregunta que tenga. Document Released: 10/15/2010 Document Revised: 02/02/2017 Document Reviewed: 02/02/2017 Elsevier Patient Education  2020 Elsevier Inc.    Agustina Caroli, MD [facility]

## 2019-08-26 ENCOUNTER — Other Ambulatory Visit: Payer: Self-pay

## 2019-08-26 ENCOUNTER — Encounter: Payer: Self-pay | Admitting: Emergency Medicine

## 2019-08-26 ENCOUNTER — Ambulatory Visit (INDEPENDENT_AMBULATORY_CARE_PROVIDER_SITE_OTHER): Payer: Self-pay | Admitting: Emergency Medicine

## 2019-08-26 VITALS — BP 137/82 | HR 114 | Temp 98.9°F | Ht 68.0 in | Wt 146.2 lb

## 2019-08-26 DIAGNOSIS — J449 Chronic obstructive pulmonary disease, unspecified: Secondary | ICD-10-CM

## 2019-08-26 DIAGNOSIS — E119 Type 2 diabetes mellitus without complications: Secondary | ICD-10-CM

## 2019-08-26 LAB — POCT GLYCOSYLATED HEMOGLOBIN (HGB A1C): Hemoglobin A1C: 12.8 % — AB (ref 4.0–5.6)

## 2019-08-26 LAB — GLUCOSE, POCT (MANUAL RESULT ENTRY): POC Glucose: 375 mg/dl — AB (ref 70–99)

## 2019-08-26 MED ORDER — ALBUTEROL SULFATE HFA 108 (90 BASE) MCG/ACT IN AERS: 1.0000 | INHALATION_SPRAY | Freq: Four times a day (QID) | RESPIRATORY_TRACT | 11 refills | Status: DC | PRN

## 2019-08-26 MED ORDER — GLIPIZIDE 5 MG PO TABS: 5.0000 mg | ORAL_TABLET | Freq: Every day | ORAL | 1 refills | Status: DC

## 2019-08-26 MED ORDER — ATORVASTATIN CALCIUM 20 MG PO TABS: 20.0000 mg | ORAL_TABLET | Freq: Every day | ORAL | 3 refills | Status: DC

## 2019-08-26 MED ORDER — METFORMIN HCL 500 MG PO TABS: 500.0000 mg | ORAL_TABLET | Freq: Two times a day (BID) | ORAL | 3 refills | Status: DC

## 2019-08-26 MED ORDER — BUDESONIDE-FORMOTEROL FUMARATE 160-4.5 MCG/ACT IN AERO: 2.0000 | INHALATION_SPRAY | Freq: Two times a day (BID) | RESPIRATORY_TRACT | 11 refills | Status: DC

## 2019-08-26 NOTE — Progress Notes (Signed)
Duane Cuevas 64 y.o.   Chief Complaint  Patient presents with  . Follow-up    asking for hard copy for rx for refills, refills needed on all meds. Also has concerns of high blood sugar, says reading have been high as 500    HISTORY OF PRESENT ILLNESS: This is a 64 y.o. male with history of COPD here for follow-up and medication refill. Uses Symbicort and albuterol. Also has a history of prediabetes and states blood sugar has been high at home.  On no medication. Otherwise doing well.  No other complaints or medical concerns today. Smoking only 1 to 2 cigarettes/day.  Has significantly cut down on smoking.  HPI   Prior to Admission medications   Medication Sig Start Date End Date Taking? Authorizing Provider  albuterol (VENTOLIN HFA) 108 (90 Base) MCG/ACT inhaler Inhale 1-2 puffs into the lungs every 6 (six) hours as needed for wheezing or shortness of breath. 01/20/19  Yes Ripley Fraise, MD  budesonide-formoterol [facility]) 160-4.5 MCG/ACT inhaler Inhale 2 puffs into the lungs 2 (two) times daily. 08/14/18  Yes Tanda Rockers, MD  Multiple Vitamin (MULTIVITAMIN) capsule Take 1 capsule by mouth daily.   Yes [provider]  OXYGEN Inhale 2 L into the lungs daily as needed (SOB).    Yes [provider]  predniSONE (DELTASONE) 20 MG tablet Take 2 tablets (40 mg total) by mouth daily. 02/10/19  Yes Carlisle Cater, PA-C    No Known Allergies  Patient Active Problem List   Diagnosis Date Noted  . Cigarette smoker 08/15/2018  . COPD ? GOLD III/ active smoker 08/14/2018  . History of diet-controlled diabetes 06/30/2018  . COPD (chronic obstructive pulmonary disease) ([place]) 10/14/2014    Past Medical History:  Diagnosis Date  . Diabetes mellitus ([place]) 10/2014   pt denies being diabetic.   . Emphysema of lung ([place])   . Emphysema/COPD ([place]) 10/2014  . Hepatic steatosis   . Thrombocytopenia ([place]) 09/2015   platelets in 120s.     Past Surgical History:    Procedure Laterality Date  . ESOPHAGOGASTRODUODENOSCOPY N/A 09/23/2015   Procedure: ESOPHAGOGASTRODUODENOSCOPY (EGD);  Surgeon: Ladene Artist, MD;  Location: [facility];  Service: Endoscopy;  Laterality: N/A;  . FOOT SURGERY    . INGUINAL HERNIA REPAIR Left 05/15/2018   Procedure: LEFT INGUINAL HERNIA REPAIR WITH MESH;  Surgeon: Erroll Luna, MD;  Location: [facility];  Service: General;  Laterality: Left;  . INSERTION OF MESH Left 05/15/2018   Procedure: INSERTION OF MESH;  Surgeon: Erroll Luna, MD;  Location: [place];  Service: General;  Laterality: Left;  . UPPER GASTROINTESTINAL ENDOSCOPY      Social History   Socioeconomic History  . Marital status: Married    Spouse name: Ramonita Lab  . Number of children: 6  . Years of education: Not on file  . Highest education level: Not on file  Occupational History  . Occupation: Employed    Employer: [employer]  Tobacco Use  . Smoking status: Current Every Day Smoker    Packs/day: 0.50    Years: 47.00    Pack years: 23.50    Types: Cigarettes  . Smokeless tobacco: Never Used  Substance and Sexual Activity  . Alcohol use: Yes    Comment: occ  . Drug use: No  . Sexual activity: Never  Other Topics Concern  . Not on file  Social History Narrative   Lives with wife.  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  Social Determinants of Health   Financial Resource Strain:   . Difficulty of Paying Living Expenses: Not on file  Food Insecurity:   . Worried About Charity fundraiser in the Last Year: Not on file  . Ran Out of Food in the Last Year: Not on file  Transportation Needs:   . Lack of Transportation (Medical): Not on file  . Lack of Transportation (Non-Medical): Not on file  Physical Activity:   . Days of Exercise per Week: Not on file  . Minutes of Exercise per Session: Not on file  Stress:   . Feeling of Stress : Not on file  Social Connections:   . Frequency of Communication with Friends and Family: Not  on file  . Frequency of Social Gatherings with Friends and Family: Not on file  . Attends Religious Services: Not on file  . Active Member of Clubs or Organizations: Not on file  . Attends Archivist Meetings: Not on file  . Marital Status: Not on file  Intimate Partner Violence:   . Fear of Current or Ex-Partner: Not on file  . Emotionally Abused: Not on file  . Physically Abused: Not on file  . Sexually Abused: Not on file    Family History  Problem Relation Age of Onset  . Emphysema Mother   . Diabetes Mother   . Emphysema Father   . Cancer Sister        Stomach cancer  . Diabetes Sister   . Stomach cancer Sister   . Diabetes Brother   . Colon cancer Neg Hx   . Colon polyps Neg Hx   . Esophageal cancer Neg Hx   . Rectal cancer Neg Hx      Review of Systems  Constitutional: Negative.  Negative for chills and fever.  HENT: Negative.  Negative for congestion and sore throat.   Respiratory: Positive for cough (Chronic). Negative for sputum production, shortness of breath and wheezing.   Cardiovascular: Negative.  Negative for chest pain.  Gastrointestinal: Negative.  Negative for abdominal pain, diarrhea, nausea and vomiting.  Genitourinary: Negative.  Negative for dysuria and hematuria.  Skin: Negative.  Negative for rash.  Neurological: Negative.  Negative for dizziness and headaches.  Endo/Heme/Allergies: Negative.   All other systems reviewed and are negative.  Today's Vitals   08/26/19 0831  BP: 137/82  Pulse: (!) 114  Temp: 98.9 F (37.2 C)  SpO2: 93%  Weight: 146 lb 3.2 oz (66.3 kg)  Height: 5\' 8"  (1.727 m)   Body mass index is 22.23 kg/m.   Physical Exam Vitals reviewed.  Constitutional:      Appearance: Normal appearance.  HENT:     Head: Normocephalic.  Eyes:     Extraocular Movements: Extraocular movements intact.     Conjunctiva/sclera: Conjunctivae normal.     Pupils: Pupils are equal, round, and reactive to light.    Cardiovascular:     Rate and Rhythm: Normal rate and regular rhythm.     Pulses: Normal pulses.     Heart sounds: Normal heart sounds.  Pulmonary:     Effort: Pulmonary effort is normal.     Breath sounds: Normal breath sounds.  Abdominal:     Palpations: Abdomen is soft.     Tenderness: There is no abdominal tenderness.  Musculoskeletal:        General: Normal range of motion.     Cervical back: Normal range of motion and neck supple.  Skin:    General:  Skin is warm and dry.     Capillary Refill: Capillary refill takes less than 2 seconds.  Neurological:     General: No focal deficit present.     Mental Status: He is alert and oriented to person, place, and time.  Psychiatric:        Mood and Affect: Mood normal.        Behavior: Behavior normal.    Results for orders placed or performed in visit on 08/26/19 (from the past 24 hour(s))  POCT glucose (manual entry)     Status: Abnormal   Collection Time: 08/26/19  8:57 AM  Result Value Ref Range   POC Glucose 375 (A) 70 - 99 mg/dl  POCT glycosylated hemoglobin (Hb A1C)     Status: Abnormal   Collection Time: 08/26/19  9:01 AM  Result Value Ref Range   Hemoglobin A1C 12.8 (A) 4.0 - 5.6 %   HbA1c POC (<> result, manual entry)     HbA1c, POC (prediabetic range)     HbA1c, POC (controlled diabetic range)       ASSESSMENT & PLAN: Thierno was seen today for follow-up.  Diagnoses and all orders for this visit:  Chronic obstructive pulmonary disease, unspecified COPD type ([place]) -     budesonide-formoterol (SYMBICORT) 160-4.5 MCG/ACT inhaler; Inhale 2 puffs into the lungs 2 (two) times daily. -     albuterol (VENTOLIN HFA) 108 (90 Base) MCG/ACT inhaler; Inhale 1-2 puffs into the lungs every 6 (six) hours as needed for wheezing or shortness of breath.  New onset type 2 diabetes mellitus ([facility]) -     metFORMIN (GLUCOPHAGE) 500 MG tablet; Take 1 tablet (500 mg total) by mouth 2 (two) times daily with a meal. -     glipiZIDE  (GLUCOTROL) 5 MG tablet; Take 1 tablet (5 mg total) by mouth daily with breakfast. -     atorvastatin (LIPITOR) 20 MG tablet; Take 1 tablet (20 mg total) by mouth daily.  Other orders -     POCT glucose (manual entry) -     POCT glycosylated hemoglobin (Hb A1C)    Patient Instructions       If you have lab work done today you will be contacted with your lab results within the next 2 weeks.  If you have not heard from [place] then please contact us. The fastest way to get your results is to register for My Chart.   IF you received an x-ray today, you will receive an invoice from [facility]. Please contact [facility] at [phone] with questions or concerns regarding your invoice.   IF you received labwork today, you will receive an invoice from [facility]. Please contact [facility] at [phone] with questions or concerns regarding your invoice.   Our billing staff will not be able to assist you with questions regarding bills from these companies.  You will be contacted with the lab results as soon as they are available. The fastest way to get your results is to activate your My Chart account. Instructions are located on the last page of this paperwork. If you have not heard from [place] regarding the results in 2 weeks, please contact this office.     Enfermedad pulmonar obstructiva crnica Chronic Obstructive Pulmonary Disease La enfermedad pulmonar obstructiva crnica (EPOC) es un problema pulmonar crnico de larga duracin. La EPOC dificulta la entrada y salida de aire de los pulmones. Por lo general, la enfermedad empeora con [employer], y los pulmones nunca volvern a la  normalidad. Puede hacer algunas cosas para mantenerse lo ms sano posible.  El mdico puede tratar la enfermedad con: ? Medicamentos. ? Oxgeno. ? Ciruga pulmonar.  El mdico tambin puede recomendarle: ? Rehabilitacin. Esto incluye medidas para que el cuerpo funcione mejor. Puede requerir la  participacin de un equipo de especialistas. ? Si fuma, dejar de hacerlo. ? Ejercicio y [employer]son dieta. ? Medidas para el bienestar (cuidados paliativos). Siga estas indicaciones en su casa: Medicamentos  Delphi de venta libre y los recetados solamente como se lo haya indicado el mdico.  Hable con el mdico antes de tomar cualquier medicamento para la tos o las Erda. Es posible que Product/process development scientist los medicamentos que hagan que los pulmones se sequen. Estilo de vida  Si fuma, deje de hacerlo. Fumar empeora el problema. Si necesita ayuda para dejar de fumar, consulte al mdico.  Evite rodearse de factores que empeoran su respiracin. Por ejemplo, humo, productos qumicos y vapores.  Mantngase activo, pero recuerde que tambin Administrator.  Aprenda y [employer] consejos sobre cmo [place].  Asegrese de dormir lo suficiente. La State Farm de los adultos necesitan dormir 7horas como mnimo todas las noches.  Consuma alimentos saludables. Consuma pequeas cantidades de alimentos con mayor frecuencia. Descanse antes de las comidas. Respiracin controlada Aprenda y utilice consejos sobre cmo controlar su respiracin segn lo aconsejado por su mdico. Intente lo siguiente:  Inspire (inhalar) por la nariz durante 1segundo. Luego frunza los labios y espire (exhale) a travs de los labios durante 2segundos.  Coloque una mano sobre el vientre (abdomen). Inhale lentamente por la nariz durante 1segundo. La mano que est sobre el vientre debe moverse. Frunza los labios y espire lentamente por los labios. La mano que est sobre su vientre debe moverse cuando exhale. Tos controlada Aprenda a utilizar la tos controlada para despejar la mucosidad de los pulmones. Siga estos pasos: 1. Incline la cabeza un poco hacia adelante. 2. Inhale profundamente. 3. Trate de mantener el aire por 3 segundos. 4. Mantenga la boca ligeramente abierta mientras tose 2veces. 5. Escupa la  mucosidad en un pauelo. 6. Descanse y repita los pasos 1 o 2 veces, segn lo necesite.  Instrucciones generales  Asegrese de recibir todas las vacunas que el mdico le recomiende. Consulte al mdico acerca de la vacuna contra la gripe y la vacuna contra la neumona.  Use la oxigenoterapia y la rehabilitacin pulmonar si as se lo indica el mdico. Si necesita oxigenoterapia en su casa, pregntele al mdico si debe comprar una herramienta para medir su nivel de oxgeno (oxmetro).  Realice un plan de accin para la EPOC junto a su mdico. Esto lo ayudar a saber qu debe hacer si se siente peor que de costumbre.  Mantenga bajo control cualquier otra afeccin que padezca segn las indicaciones del mdico.  Evite salir cuando hace mucho calor, fro o humedad.  Evite a las personas que tengan una enfermedad contagiosa.  Concurra a todas las visitas de control como se lo haya indicado el mdico. Esto es importante. Comunquese con un mdico si:  Tose y elimina ms mucosidad que lo habitual.  Hay un cambio en el color o en la consistencia del moco.  Le cuesta respirar ms de lo normal.  La respiracin es ms rpida que lo habitual.  Tiene dificultad para dormir.  Debe usar sus medicamentos con ms frecuencia que lo habitual.  Tiene dificultad para [employer] cotidianas, como vestirse o caminar por la casa. Solicite ayuda de  inmediato si:  Tiene dificultad para respirar mientras descansa.  Tiene dificultad para respirar y [employer] hacer lo siguiente: ? Poder hablar. ? Hacer actividades normales.  Le duele el pecho durante ms de 69minutos.  Tiene la piel ms [employer] lo normal.  El pulsioxmetro muestra que tiene un nivel bajo de oxgeno durante ms de 41minutos.  Tiene fiebre.  Se siente demasiado cansado para respirar con normalidad. Resumen  La enfermedad pulmonar obstructiva crnica (EPOC) es un problema pulmonar de larga duracin.  El  funcionamiento de los pulmones nunca volver a la normalidad. Por lo general, la enfermedad empeora con el tiempo. Puede hacer algunas cosas para mantenerse lo ms sano posible.  Tome los medicamentos de venta libre y los recetados solamente como se lo haya indicado el mdico.  Si fuma, deje de [place]. Fumar empeora el problema. Esta informacin no tiene Marine scientist el consejo del mdico. Asegrese de hacerle al mdico cualquier pregunta que tenga. Document Revised: 02/02/2017 Document Reviewed: 02/02/2017 Elsevier Patient Education  2020 [employer].  Diabetes mellitus y nutricin, en adultos Diabetes Mellitus and Nutrition, Adult Si sufre de diabetes (diabetes mellitus), es muy importante tener hbitos alimenticios saludables debido a que sus niveles de Designer, television/film set sangre (glucosa) se ven afectados en gran medida por lo que come y bebe. [place] alimentos saludables en las cantidades [place], aproximadamente a la [employer] Corporation, [place] ayudar a:  Aeronautical engineer glucemia.  Disminuir el riesgo de sufrir una enfermedad cardaca.  Mejorar la presin arterial.  Science writer o mantener un peso saludable. Todas las personas que sufren de diabetes son diferentes y cada una tiene necesidades diferentes en cuanto a un plan de alimentacin. El mdico puede recomendarle que trabaje con un especialista en dietas y nutricin (nutricionista) para Financial trader plan para usted. Su plan de alimentacin puede variar segn factores como:  Las caloras que necesita.  Los medicamentos que toma.  Su peso.  Sus niveles de glucemia, presin arterial y colesterol.  Su nivel de [place].  Otras afecciones que tenga, como enfermedades cardacas o renales. Cmo me afectan los carbohidratos? Los carbohidratos, o hidratos de carbono, afectan su nivel de glucemia ms que cualquier otro tipo de alimento. La ingesta de carbohidratos naturalmente aumenta la cantidad de [employer]. El  recuento de carbohidratos es un mtodo destinado a Catering manager un registro de la cantidad de carbohidratos que se consumen. El recuento de carbohidratos es importante para Theatre manager la glucemia a un nivel saludable, especialmente si utiliza insulina o toma determinados medicamentos por va oral para la diabetes. Es importante conocer la cantidad de carbohidratos que se pueden ingerir en cada comida sin correr Engineer, manufacturing. Esto es Psychologist, forensic. Su nutricionista puede ayudarlo a calcular la cantidad de carbohidratos que debe ingerir en cada comida y en cada refrigerio. Entre los alimentos que contienen carbohidratos, se incluyen:  Pan, cereal, arroz, pastas y galletas.  Papas y maz.  Guisantes, frijoles y lentejas.  Leche y Estate agent.  Lambert Mody y [place].  Postres, como pasteles, galletas, helado y caramelos. Cmo me afecta el alcohol? El alcohol puede provocar disminuciones sbitas de la glucemia (hipoglucemia), especialmente si utiliza insulina o toma determinados medicamentos por va oral para la diabetes. La hipoglucemia es una afeccin potencialmente mortal. Los sntomas de la hipoglucemia (somnolencia, mareos y confusin) son similares a los sntomas de haber consumido demasiado alcohol. Si el mdico afirma que el alcohol es seguro para usted, [place] estas pautas:  Limite  el consumo de alcohol a no ms de 1medida por da si es mujer y no est [place], y a 82medidas si es hombre. Una medida equivale a 12oz (37ml) de cerveza, 5oz (153ml) de vino o 1oz (10ml) de bebidas alcohlicas de alta graduacin.  No beba con el estmago vaco.  Mantngase hidratado bebiendo agua, refrescos dietticos o t helado sin azcar.  Tenga en cuenta que los refrescos comunes, los jugos y otras bebida para Optician, dispensing pueden contener mucha azcar y se deben contar como carbohidratos. Cules son algunos consejos para seguir este plan?  Leer las etiquetas de los alimentos  Comience por leer el  tamao de la porcin en la "Informacin nutricional" en las etiquetas de los alimentos envasados y las bebidas. La cantidad de caloras, carbohidratos, grasas y otros nutrientes mencionados en la etiqueta se basan en una porcin del alimento. Muchos alimentos contienen ms de una porcin por envase.  Verifique la cantidad total de gramos (g) de carbohidratos totales en una porcin. Puede calcular la cantidad de porciones de carbohidratos al dividir el total de carbohidratos por 15. Por ejemplo, si un alimento tiene un total de 30g de carbohidratos, equivale a 2 porciones de carbohidratos.  Verifique la cantidad de gramos (g) de grasas saturadas y grasas trans en una porcin. Escoja alimentos que no contengan grasa o que tengan un bajo contenido.  Verifique la cantidad de miligramos (mg) de sal (sodio) en una porcin. La State Farm de las personas deben limitar la ingesta de sodio total a menos de 2300mg  por Training and development officer.  Siempre consulte la informacin nutricional de los alimentos etiquetados como "con bajo contenido de grasa" o "sin grasa". Estos alimentos pueden tener un mayor contenido de Location manager agregada o carbohidratos refinados, y deben evitarse.  Hable con su nutricionista para identificar sus objetivos diarios en cuanto a los nutrientes mencionados en la etiqueta. Al ir de compras  Evite comprar alimentos procesados, enlatados o precocinados. Estos alimentos tienden a Special educational needs teacher mayor cantidad de [place], sodio y azcar agregada.  Compre en la zona exterior de la tienda de comestibles. Esta zona incluye frutas y verduras frescas, granos a granel, carnes frescas y productos lcteos frescos. Al cocinar  Utilice mtodos de coccin a baja temperatura, como hornear, en lugar de mtodos de coccin a alta temperatura, como frer en abundante aceite.  Cocine con aceites saludables, como el aceite de [place], canola o [place].  Evite cocinar con manteca, crema o carnes con alto contenido de  grasa. Planificacin de las comidas  Coma las comidas y los refrigerios regularmente, preferentemente a la misma hora todos [place]. Evite pasar largos perodos de tiempo sin comer.  Consuma alimentos ricos en fibra, como frutas frescas, verduras, frijoles y cereales integrales. Consulte a su nutricionista sobre cuntas porciones de carbohidratos puede consumir en cada comida.  Consuma entre 4 y 6 onzas (oz) de protenas magras por da, como carnes [place], pollo, pescado, huevos o tofu. Una onza de protena magra equivale a: ? 1 onza de carne, pollo o pescado. ? 1huevo. ?  taza de tofu.  Coma algunos alimentos por da que contengan grasas saludables, como aguacates, frutos secos, semillas y pescado. Estilo de vida  Controle su nivel de glucemia con regularidad.  Haga actividad fsica habitualmente como se lo haya indicado el mdico. Esto puede incluir lo siguiente: ? 170minutos semanales de ejercicio de intensidad moderada o alta. Esto podra incluir caminatas dinmicas, ciclismo o gimnasia acutica. ? Realizar ejercicios de elongacin y de fortalecimiento, como yoga o levantamiento de  pesas, por lo menos 2veces por semana.  Tome los [employer] se lo haya indicado el mdico.  No consuma ningn producto que contenga nicotina o tabaco, como cigarrillos y Psychologist, sport and exercise. Si necesita ayuda para dejar de fumar, consulte al [employer] con un asesor o instructor en diabetes para identificar estrategias para controlar el estrs y cualquier desafo emocional y social. Preguntas para hacerle al mdico  Es necesario que consulte a Radio broadcast assistant en el cuidado de la diabetes?  Es necesario que me rena con un nutricionista?  A qu nmero puedo llamar si tengo preguntas?  Cules son los mejores momentos para controlar la glucemia? Dnde encontrar ms informacin:  Asociacin Estadounidense de la Diabetes (American Diabetes Association): diabetes.org  Academia  de Nutricin y Information systems manager (Academy of Nutrition and Dietetics): www.eatright.[place] Diabetes y las Enfermedades Digestivas y [facility] of Diabetes and Digestive and Kidney Diseases, NIH): [employer]eral.dk Resumen  Un plan de alimentacin saludable lo ayudar a Aeronautical engineer glucemia y Theatre manager un estilo de vida saludable.  Trabajar con un especialista en dietas y nutricin (nutricionista) puede ayudarlo a Insurance claims handler de alimentacin para usted.  Tenga en cuenta que los carbohidratos (hidratos de carbono) y el alcohol tienen efectos inmediatos en sus niveles de glucemia. Es importante contar los carbohidratos que ingiere y consumir alcohol con prudencia. Esta informacin no tiene Marine scientist el consejo del mdico. Asegrese de hacerle al mdico cualquier pregunta que tenga. Document Revised: 02/28/2017 Document Reviewed: 10/10/2016 Elsevier Patient Education  2020 Elsevier Inc.      Agustina Caroli, MD [facility]

## 2019-08-26 NOTE — Patient Instructions (Addendum)
If you have lab work done today you will be contacted with your lab results within the next 2 weeks.  If you have not heard from [place] then please contact us. The fastest way to get your results is to register for My Chart.   IF you received an x-ray today, you will receive an invoice from [facility]. Please contact [facility] at [phone] with questions or concerns regarding your invoice.   IF you received labwork today, you will receive an invoice from [facility]. Please contact [facility] at [phone] with questions or concerns regarding your invoice.   Our billing staff will not be able to assist you with questions regarding bills from these companies.  You will be contacted with the lab results as soon as they are available. The fastest way to get your results is to activate your My Chart account. Instructions are located on the last page of this paperwork. If you have not heard from [place] regarding the results in 2 weeks, please contact this office.     Enfermedad pulmonar obstructiva crnica Chronic Obstructive Pulmonary Disease La enfermedad pulmonar obstructiva crnica (EPOC) es un problema pulmonar crnico de larga duracin. La EPOC dificulta la entrada y salida de aire de los pulmones. Por lo general, la enfermedad empeora con [employer], y los pulmones nunca volvern a la normalidad. Puede hacer algunas cosas para mantenerse lo ms sano posible.  El mdico puede tratar la enfermedad con: ? Medicamentos. ? Oxgeno. ? Ciruga pulmonar.  El mdico tambin puede recomendarle: ? Rehabilitacin. Esto incluye medidas para que el cuerpo funcione mejor. Puede requerir la participacin de un equipo de especialistas. ? Si fuma, dejar de hacerlo. ? Ejercicio y [employer]son dieta. ? Medidas para el bienestar (cuidados paliativos). Siga estas indicaciones en su casa: Medicamentos  Delphi de venta libre y los recetados solamente como se lo haya  indicado el mdico.  Hable con el mdico antes de tomar cualquier medicamento para la tos o las [place]. Es posible que Product/process development scientist los medicamentos que hagan que los pulmones se sequen. Estilo de vida  Si fuma, deje de hacerlo. Fumar empeora el problema. Si necesita ayuda para dejar de fumar, consulte al mdico.  Evite rodearse de factores que empeoran su respiracin. Por ejemplo, humo, productos qumicos y vapores.  Mantngase activo, pero recuerde que tambin Administrator.  Aprenda y [employer] consejos sobre cmo [place].  Asegrese de dormir lo suficiente. La [employer] de los adultos necesitan dormir 7horas como mnimo todas las noches.  Consuma alimentos saludables. Consuma pequeas cantidades de alimentos con mayor frecuencia. Descanse antes de las comidas. Respiracin controlada Aprenda y utilice consejos sobre cmo controlar su respiracin segn lo aconsejado por su mdico. Intente lo siguiente:  Inspire (inhalar) por la nariz durante 1segundo. Luego frunza los labios y espire (exhale) a travs de los labios durante 2segundos.  Coloque una mano sobre el vientre (abdomen). Inhale lentamente por la nariz durante 1segundo. La mano que est sobre el vientre debe moverse. Frunza los labios y espire lentamente por los labios. La mano que est sobre su vientre debe moverse cuando exhale. Tos controlada Aprenda a utilizar la tos controlada para despejar la mucosidad de los pulmones. Siga estos pasos: 1. Incline la cabeza un poco hacia adelante. 2. Inhale profundamente. 3. Trate de mantener el aire por 3 segundos. 4. Mantenga la boca ligeramente abierta mientras tose 2veces. 5. Escupa la mucosidad en un pauelo. 6. Descanse y repita los pasos 1  o 2 veces, segn lo necesite.  Instrucciones generales  Asegrese de recibir todas las vacunas que el mdico le recomiende. Consulte al mdico acerca de la vacuna contra la gripe y la vacuna contra la neumona.  Use la  oxigenoterapia y la rehabilitacin pulmonar si as se lo indica el mdico. Si necesita oxigenoterapia en su casa, pregntele al mdico si debe comprar una herramienta para medir su nivel de oxgeno (oxmetro).  Realice un plan de accin para la EPOC junto a su mdico. Esto lo ayudar a saber qu debe hacer si se siente peor que de costumbre.  Mantenga bajo control cualquier otra afeccin que padezca segn las indicaciones del mdico.  Evite salir cuando hace mucho calor, fro o humedad.  Evite a las personas que tengan una enfermedad contagiosa.  Concurra a todas las visitas de control como se lo haya indicado el mdico. Esto es importante. Comunquese con un mdico si:  Tose y elimina ms mucosidad que lo habitual.  Hay un cambio en el color o en la consistencia del moco.  Le cuesta respirar ms de lo normal.  La respiracin es ms rpida que lo habitual.  Tiene dificultad para dormir.  Debe usar sus medicamentos con ms frecuencia que lo habitual.  Tiene dificultad para [employer] cotidianas, como vestirse o caminar por la casa. Solicite ayuda de inmediato si:  Tiene dificultad para Chemical engineer.  Tiene dificultad para respirar y [employer] hacer lo siguiente: ? Poder hablar. ? Hacer actividades normales.  Le duele el pecho durante ms de 70minutos.  Tiene la piel ms [employer] lo normal.  El pulsioxmetro muestra que tiene un nivel bajo de oxgeno durante ms de 92minutos.  Tiene fiebre.  Se siente demasiado cansado para respirar con normalidad. Resumen  La enfermedad pulmonar obstructiva crnica (EPOC) es un problema pulmonar de larga duracin.  El funcionamiento de los pulmones nunca volver a la normalidad. Por lo general, la enfermedad empeora con el tiempo. Puede hacer algunas cosas para mantenerse lo ms sano posible.  Tome los medicamentos de venta libre y los recetados solamente como se lo haya indicado el mdico.  Si  fuma, deje de [place]. Fumar empeora el problema. Esta informacin no tiene Marine scientist el consejo del mdico. Asegrese de hacerle al mdico cualquier pregunta que tenga. Document Revised: 02/02/2017 Document Reviewed: 02/02/2017 Elsevier Patient Education  2020 [employer].  Diabetes mellitus y nutricin, en adultos Diabetes Mellitus and Nutrition, Adult Si sufre de diabetes (diabetes mellitus), es muy importante tener hbitos alimenticios saludables debido a que sus niveles de Designer, television/film set sangre (glucosa) se ven afectados en gran medida por lo que come y bebe. [place] alimentos saludables en las cantidades [place], aproximadamente a la [employer] Technologies Corporation, [place] ayudar a:  Aeronautical engineer glucemia.  Disminuir el riesgo de sufrir una enfermedad cardaca.  Mejorar la presin arterial.  Science writer o mantener un peso saludable. Todas las personas que sufren de diabetes son diferentes y cada una tiene necesidades diferentes en cuanto a un plan de alimentacin. El mdico puede recomendarle que trabaje con un especialista en dietas y nutricin (nutricionista) para Financial trader plan para usted. Su plan de alimentacin puede variar segn factores como:  Las caloras que necesita.  Los medicamentos que toma.  Su peso.  Sus niveles de glucemia, presin arterial y colesterol.  Su nivel de [place].  Otras afecciones que tenga, como enfermedades cardacas o renales. Cmo me afectan los carbohidratos? Los carbohidratos, o  hidratos de carbono, afectan su nivel de glucemia ms que cualquier otro tipo de alimento. La ingesta de carbohidratos naturalmente aumenta la cantidad de [employer]. El recuento de carbohidratos es un mtodo destinado a Catering manager un registro de la cantidad de carbohidratos que se consumen. El recuento de carbohidratos es importante para Theatre manager la glucemia a un nivel saludable, especialmente si utiliza insulina o toma determinados medicamentos por va  oral para la diabetes. Es importante conocer la cantidad de carbohidratos que se pueden ingerir en cada comida sin correr Engineer, manufacturing. Esto es Psychologist, forensic. Su nutricionista puede ayudarlo a calcular la cantidad de carbohidratos que debe ingerir en cada comida y en cada refrigerio. Entre los alimentos que contienen carbohidratos, se incluyen:  Pan, cereal, arroz, pastas y galletas.  Papas y maz.  Guisantes, frijoles y lentejas.  Leche y Estate agent.  Lambert Mody y [place].  Postres, como pasteles, galletas, helado y caramelos. Cmo me afecta el alcohol? El alcohol puede provocar disminuciones sbitas de la glucemia (hipoglucemia), especialmente si utiliza insulina o toma determinados medicamentos por va oral para la diabetes. La hipoglucemia es una afeccin potencialmente mortal. Los sntomas de la hipoglucemia (somnolencia, mareos y confusin) son similares a los sntomas de haber consumido demasiado alcohol. Si el mdico afirma que el alcohol es seguro para usted, [place] estas pautas:  Limite el consumo de alcohol a no ms de 5medida por da si es mujer y no est [place], y a 66medidas si es hombre. Una medida equivale a 12oz (333ml) de cerveza, 5oz (145ml) de vino o 1oz (37ml) de bebidas alcohlicas de alta graduacin.  No beba con el estmago vaco.  Mantngase hidratado bebiendo agua, refrescos dietticos o t helado sin azcar.  Tenga en cuenta que los refrescos comunes, los jugos y otras bebida para Optician, dispensing pueden contener mucha azcar y se deben contar como carbohidratos. Cules son algunos consejos para seguir este plan?  Leer las etiquetas de los alimentos  Comience por leer el tamao de la porcin en la "Informacin nutricional" en las etiquetas de los alimentos envasados y las bebidas. La cantidad de caloras, carbohidratos, grasas y otros nutrientes mencionados en la etiqueta se basan en una porcin del alimento. Muchos alimentos contienen ms de una porcin  por envase.  Verifique la cantidad total de gramos (g) de carbohidratos totales en una porcin. Puede calcular la cantidad de porciones de carbohidratos al dividir el total de carbohidratos por 15. Por ejemplo, si un alimento tiene un total de 30g de carbohidratos, equivale a 2 porciones de carbohidratos.  Verifique la cantidad de gramos (g) de grasas saturadas y grasas trans en una porcin. Escoja alimentos que no contengan grasa o que tengan un bajo contenido.  Verifique la cantidad de miligramos (mg) de sal (sodio) en una porcin. La State Farm de las personas deben limitar la ingesta de sodio total a menos de 2300mg  por Training and development officer.  Siempre consulte la informacin nutricional de los alimentos etiquetados como "con bajo contenido de grasa" o "sin grasa". Estos alimentos pueden tener un mayor contenido de Location manager agregada o carbohidratos refinados, y deben evitarse.  Hable con su nutricionista para identificar sus objetivos diarios en cuanto a los nutrientes mencionados en la etiqueta. Al ir de compras  Evite comprar alimentos procesados, enlatados o precocinados. Estos alimentos tienden a Special educational needs teacher mayor cantidad de [place], sodio y azcar agregada.  Compre en la zona exterior de la tienda de comestibles. Esta zona incluye frutas y verduras frescas, granos a granel, carnes frescas y  productos lcteos frescos. Al cocinar  Utilice mtodos de coccin a baja temperatura, como hornear, en lugar de mtodos de coccin a alta temperatura, como frer en abundante aceite.  Cocine con aceites saludables, como el aceite de [place], canola o [place].  Evite cocinar con manteca, crema o carnes con alto contenido de grasa. Planificacin de las comidas  Coma las comidas y los refrigerios regularmente, preferentemente a la misma hora todos [place]. Evite pasar largos perodos de tiempo sin comer.  Consuma alimentos ricos en fibra, como frutas frescas, verduras, frijoles y cereales integrales. Consulte a su  nutricionista sobre cuntas porciones de carbohidratos puede consumir en cada comida.  Consuma entre 4 y 6 onzas (oz) de protenas magras por da, como carnes [place], pollo, pescado, huevos o tofu. Una onza de protena magra equivale a: ? 1 onza de carne, pollo o pescado. ? 1huevo. ?  taza de tofu.  Coma algunos alimentos por da que contengan grasas saludables, como aguacates, frutos secos, semillas y pescado. Estilo de vida  Controle su nivel de glucemia con regularidad.  Haga actividad fsica habitualmente como se lo haya indicado el mdico. Esto puede incluir lo siguiente: ? 154minutos semanales de ejercicio de intensidad moderada o alta. Esto podra incluir caminatas dinmicas, ciclismo o gimnasia acutica. ? Realizar ejercicios de elongacin y de fortalecimiento, como yoga o levantamiento de pesas, por lo menos 2veces por semana.  Tome los [employer] se lo haya indicado el mdico.  No consuma ningn producto que contenga nicotina o tabaco, como cigarrillos y Psychologist, sport and exercise. Si necesita ayuda para dejar de fumar, consulte al [employer] con un asesor o instructor en diabetes para identificar estrategias para controlar el estrs y cualquier desafo emocional y social. Preguntas para hacerle al mdico  Es necesario que consulte a Radio broadcast assistant en el cuidado de la diabetes?  Es necesario que me rena con un nutricionista?  A qu nmero puedo llamar si tengo preguntas?  Cules son los mejores momentos para controlar la glucemia? Dnde encontrar ms informacin:  Asociacin Estadounidense de la Diabetes (American Diabetes Association): diabetes.org  Academia de Nutricin y Information systems manager (Academy of Nutrition and Dietetics): www.eatright.[place] Diabetes y las Enfermedades Digestivas y [place] of Diabetes and Digestive and Kidney Diseases, NIH): [employer]eral.dk Resumen  Un plan de alimentacin saludable lo  ayudar a Aeronautical engineer glucemia y Theatre manager un estilo de vida saludable.  Trabajar con un especialista en dietas y nutricin (nutricionista) puede ayudarlo a Insurance claims handler de alimentacin para usted.  Tenga en cuenta que los carbohidratos (hidratos de carbono) y el alcohol tienen efectos inmediatos en sus niveles de glucemia. Es importante contar los carbohidratos que ingiere y consumir alcohol con prudencia. Esta informacin no tiene Marine scientist el consejo del mdico. Asegrese de hacerle al mdico cualquier pregunta que tenga. Document Revised: 02/28/2017 Document Reviewed: 10/10/2016 Elsevier Patient Education  [place].

## 2019-11-01 ENCOUNTER — Other Ambulatory Visit: Payer: Self-pay

## 2019-11-01 ENCOUNTER — Emergency Department (HOSPITAL_COMMUNITY): Payer: Self-pay

## 2019-11-01 ENCOUNTER — Encounter (HOSPITAL_COMMUNITY): Payer: Self-pay

## 2019-11-01 ENCOUNTER — Observation Stay (HOSPITAL_COMMUNITY)
Admission: EM | Admit: 2019-11-01 | Discharge: 2019-11-02 | Disposition: A | Discharge status: Home / Self Care | Payer: Self-pay | Attending: Internal Medicine | Admitting: Internal Medicine

## 2019-11-01 DIAGNOSIS — R0602 Shortness of breath: Secondary | ICD-10-CM | POA: Insufficient documentation

## 2019-11-01 DIAGNOSIS — R079 Chest pain, unspecified: Principal | ICD-10-CM | POA: Insufficient documentation

## 2019-11-01 DIAGNOSIS — I252 Old myocardial infarction: Secondary | ICD-10-CM

## 2019-11-01 DIAGNOSIS — E785 Hyperlipidemia, unspecified: Secondary | ICD-10-CM | POA: Insufficient documentation

## 2019-11-01 DIAGNOSIS — Z8639 Personal history of other endocrine, nutritional and metabolic disease: Secondary | ICD-10-CM

## 2019-11-01 DIAGNOSIS — J449 Chronic obstructive pulmonary disease, unspecified: Secondary | ICD-10-CM | POA: Diagnosis present

## 2019-11-01 DIAGNOSIS — Z9981 Dependence on supplemental oxygen: Secondary | ICD-10-CM | POA: Insufficient documentation

## 2019-11-01 DIAGNOSIS — Z79899 Other long term (current) drug therapy: Secondary | ICD-10-CM | POA: Insufficient documentation

## 2019-11-01 DIAGNOSIS — F17210 Nicotine dependence, cigarettes, uncomplicated: Secondary | ICD-10-CM | POA: Insufficient documentation

## 2019-11-01 DIAGNOSIS — Z7951 Long term (current) use of inhaled steroids: Secondary | ICD-10-CM | POA: Insufficient documentation

## 2019-11-01 DIAGNOSIS — E1169 Type 2 diabetes mellitus with other specified complication: Secondary | ICD-10-CM

## 2019-11-01 DIAGNOSIS — Z20822 Contact with and (suspected) exposure to covid-19: Secondary | ICD-10-CM | POA: Insufficient documentation

## 2019-11-01 DIAGNOSIS — J439 Emphysema, unspecified: Secondary | ICD-10-CM | POA: Insufficient documentation

## 2019-11-01 DIAGNOSIS — Z7984 Long term (current) use of oral hypoglycemic drugs: Secondary | ICD-10-CM | POA: Insufficient documentation

## 2019-11-01 DIAGNOSIS — E119 Type 2 diabetes mellitus without complications: Secondary | ICD-10-CM | POA: Insufficient documentation

## 2019-11-01 LAB — HIV ANTIBODY (ROUTINE TESTING W REFLEX): HIV Screen 4th Generation wRfx: NONREACTIVE

## 2019-11-01 LAB — RESPIRATORY PANEL BY RT PCR (FLU A&B, COVID)
Influenza A by PCR: NEGATIVE
Influenza B by PCR: NEGATIVE
SARS Coronavirus 2 by RT PCR: NEGATIVE

## 2019-11-01 LAB — TROPONIN I (HIGH SENSITIVITY)
Troponin I (High Sensitivity): <2 ng/L (ref <18)
Troponin I (High Sensitivity): 2 ng/L (ref <18)
Troponin I (High Sensitivity): 3 ng/L (ref <18)
Troponin I (High Sensitivity): 3 ng/L (ref <18)

## 2019-11-01 LAB — HEPATIC FUNCTION PANEL
ALT: 22 U/L (ref 0–44)
AST: 21 U/L (ref 15–41)
Albumin: 4.4 g/dL (ref 3.5–5.0)
Alkaline Phosphatase: 66 U/L (ref 38–126)
Bilirubin, Direct: 0.2 mg/dL (ref 0.0–0.2)
Indirect Bilirubin: 1.1 mg/dL — ABNORMAL HIGH (ref 0.3–0.9)
Total Bilirubin: 1.3 mg/dL — ABNORMAL HIGH (ref 0.3–1.2)
Total Protein: 7.7 g/dL (ref 6.5–8.1)

## 2019-11-01 LAB — CBC
HCT: 52.2 % — ABNORMAL HIGH (ref 39.0–52.0)
Hemoglobin: 17.6 g/dL — ABNORMAL HIGH (ref 13.0–17.0)
MCH: 32.7 pg (ref 26.0–34.0)
MCHC: 33.7 g/dL (ref 30.0–36.0)
MCV: 97 fL (ref 80.0–100.0)
Platelets: 245 10*3/uL (ref 150–400)
RBC: 5.38 MIL/uL (ref 4.22–5.81)
RDW: 12.8 % (ref 11.5–15.5)
WBC: 6.7 10*3/uL (ref 4.0–10.5)
nRBC: 0 % (ref 0.0–0.2)

## 2019-11-01 LAB — LIPID PANEL
Cholesterol: 182 mg/dL (ref 0–200)
HDL: 55 mg/dL (ref >40)
LDL Cholesterol: 110 mg/dL — ABNORMAL HIGH (ref 0–99)
Total CHOL/HDL Ratio: 3.3 RATIO
Triglycerides: 87 mg/dL (ref <150)
VLDL: 17 mg/dL (ref 0–40)

## 2019-11-01 LAB — BASIC METABOLIC PANEL
Anion gap: 9 (ref 5–15)
BUN: 13 mg/dL (ref 8–23)
CO2: 27 mmol/L (ref 22–32)
Calcium: 9.2 mg/dL (ref 8.9–10.3)
Chloride: 100 mmol/L (ref 98–111)
Creatinine, Ser: 0.83 mg/dL (ref 0.61–1.24)
GFR calc Af Amer: >60 mL/min (ref >60)
GFR calc non Af Amer: >60 mL/min (ref >60)
Glucose, Bld: 199 mg/dL — ABNORMAL HIGH (ref 70–99)
Potassium: 4.3 mmol/L (ref 3.5–5.1)
Sodium: 136 mmol/L (ref 135–145)

## 2019-11-01 LAB — D-DIMER, QUANTITATIVE: D-Dimer, Quant: 0.45 ug/mL-FEU (ref 0.00–0.50)

## 2019-11-01 LAB — LIPASE, BLOOD: Lipase: 21 U/L (ref 11–51)

## 2019-11-01 MED ORDER — MORPHINE SULFATE (PF) 4 MG/ML IV SOLN
4.0000 mg | Freq: Once | INTRAVENOUS | Status: AC
  Administered 2019-11-01: 16:00:00 4 mg via INTRAVENOUS
  Filled 2019-11-01: qty 1

## 2019-11-01 MED ORDER — ONDANSETRON HCL 4 MG/2ML IJ SOLN: 4.0000 mg | Freq: Four times a day (QID) | INTRAMUSCULAR | Status: DC | PRN

## 2019-11-01 MED ORDER — ATORVASTATIN CALCIUM 20 MG PO TABS
20.0000 mg | ORAL_TABLET | Freq: Every day | ORAL | Status: DC
  Administered 2019-11-01 – 2019-11-02 (×2): 20 mg via ORAL
  Filled 2019-11-01: qty 2
  Filled 2019-11-01: qty 1

## 2019-11-01 MED ORDER — ACETAMINOPHEN 325 MG PO TABS
650.0000 mg | ORAL_TABLET | ORAL | Status: DC | PRN
  Administered 2019-11-02: 650 mg via ORAL
  Filled 2019-11-01: qty 2

## 2019-11-01 MED ORDER — MOMETASONE FURO-FORMOTEROL FUM 200-5 MCG/ACT IN AERO
2.0000 | INHALATION_SPRAY | Freq: Two times a day (BID) | RESPIRATORY_TRACT | Status: DC
  Administered 2019-11-01 – 2019-11-02 (×2): 2 via RESPIRATORY_TRACT
  Filled 2019-11-01: qty 8.8

## 2019-11-01 MED ORDER — NICOTINE 14 MG/24HR TD PT24
14.0000 mg | MEDICATED_PATCH | Freq: Every day | TRANSDERMAL | Status: DC
  Administered 2019-11-01 – 2019-11-02 (×2): 14 mg via TRANSDERMAL
  Filled 2019-11-01 (×2): qty 1

## 2019-11-01 MED ORDER — ALBUTEROL SULFATE HFA 108 (90 BASE) MCG/ACT IN AERS
4.0000 | INHALATION_SPRAY | Freq: Once | RESPIRATORY_TRACT | Status: AC
  Administered 2019-11-01: 14:00:00 4 via RESPIRATORY_TRACT
  Filled 2019-11-01: qty 6.7

## 2019-11-01 MED ORDER — ADULT MULTIVITAMIN W/MINERALS CH
1.0000 | ORAL_TABLET | Freq: Every day | ORAL | Status: DC
  Administered 2019-11-01 – 2019-11-02 (×2): 1 via ORAL
  Filled 2019-11-01 (×2): qty 1

## 2019-11-01 MED ORDER — DICLOFENAC EPOLAMINE 1.3 % EX PTCH
1.0000 | MEDICATED_PATCH | Freq: Two times a day (BID) | CUTANEOUS | Status: DC
  Administered 2019-11-01: 23:00:00 1 via TRANSDERMAL
  Filled 2019-11-01 (×4): qty 1

## 2019-11-01 MED ORDER — GLIPIZIDE 5 MG PO TABS
5.0000 mg | ORAL_TABLET | Freq: Every day | ORAL | Status: DC
  Administered 2019-11-02: 5 mg via ORAL
  Filled 2019-11-01 (×2): qty 1

## 2019-11-01 MED ORDER — IPRATROPIUM-ALBUTEROL 0.5-2.5 (3) MG/3ML IN SOLN
3.0000 mL | RESPIRATORY_TRACT | Status: DC | PRN
  Administered 2019-11-01: 19:00:00 3 mL via RESPIRATORY_TRACT
  Filled 2019-11-01: qty 3

## 2019-11-01 MED ORDER — LIDOCAINE VISCOUS HCL 2 % MT SOLN
15.0000 mL | Freq: Once | OROMUCOSAL | Status: AC
  Administered 2019-11-01: 17:00:00 15 mL via ORAL
  Filled 2019-11-01: qty 15

## 2019-11-01 MED ORDER — MULTIVITAMINS PO CAPS: 1.0000 | ORAL_CAPSULE | Freq: Every day | ORAL | Status: DC

## 2019-11-01 MED ORDER — ENOXAPARIN SODIUM 40 MG/0.4ML ~~LOC~~ SOLN
40.0000 mg | SUBCUTANEOUS | Status: DC
  Administered 2019-11-01: 17:00:00 40 mg via SUBCUTANEOUS
  Filled 2019-11-01: qty 0.4

## 2019-11-01 MED ORDER — ALUM & MAG HYDROXIDE-SIMETH 200-200-20 MG/5ML PO SUSP
30.0000 mL | Freq: Once | ORAL | Status: AC
  Administered 2019-11-01: 17:00:00 30 mL via ORAL
  Filled 2019-11-01: qty 30

## 2019-11-01 MED ORDER — HYDROCODONE-ACETAMINOPHEN 5-325 MG PO TABS
1.0000 | ORAL_TABLET | Freq: Four times a day (QID) | ORAL | Status: DC | PRN
  Administered 2019-11-01 – 2019-11-02 (×3): 1 via ORAL
  Filled 2019-11-01 (×3): qty 1

## 2019-11-01 MED ORDER — SODIUM CHLORIDE 0.9% FLUSH: 3.0000 mL | Freq: Once | INTRAVENOUS | Status: DC

## 2019-11-01 MED ORDER — ASPIRIN EC 81 MG PO TBEC
81.0000 mg | DELAYED_RELEASE_TABLET | Freq: Every day | ORAL | Status: DC
  Administered 2019-11-01 – 2019-11-02 (×2): 81 mg via ORAL
  Filled 2019-11-01 (×2): qty 1

## 2019-11-01 MED ORDER — METFORMIN HCL 500 MG PO TABS
500.0000 mg | ORAL_TABLET | Freq: Two times a day (BID) | ORAL | Status: DC
  Administered 2019-11-01 – 2019-11-02 (×2): 500 mg via ORAL
  Filled 2019-11-01 (×2): qty 1

## 2019-11-01 MED ORDER — AEROCHAMBER PLUS FLO-VU MEDIUM MISC
1.0000 | Freq: Once | Status: AC
  Administered 2019-11-01: 14:00:00 1
  Filled 2019-11-01: qty 1

## 2019-11-01 NOTE — H&P (Signed)
[facility] History and Physical  Duane Cuevas [id number] DOB: May 14, 1956 DOA: 11/01/2019  Referring physician: ED  PCP: [name], MD   Patient is coming from: Home  Chief Complaint: Chest pain  HPI: Duane Cuevas is a 64 y.o. male with past medical history of diabetes mellitus, COPD, chronic smoker, hyperlipidemia presented to hospital with complaints of intermittent chest pain mild to moderate intensity constant since this morning.  Initially it was intermittent in nature.  The chest pain was associated with some shortness of breath and nausea.  Patient does complain of cough with whitish phlegm production and shortness of breath from his COPD.  Denies any fever, chills or rigor.  Denies any palpitation, dizziness, syncope.  Denies urinary urgency, frequency or dysuria.  Denies any nausea, vomiting or abdominal pain.  ED Course: In the ED, patient had tenderness over the chest wall with mild wheezing.  WBC showed a hemoglobin of 17.6, lipase was 21.  D-dimer was within normal limits.  Troponins x2 were negative.  Patient did have heart score of 5 so he was considered for observation in the hospital.  EKG did not show any acute ischemic changes.  Review of Systems:  All systems were reviewed and were negative unless otherwise mentioned in the HPI  Past Medical History:  Diagnosis Date  . Diabetes mellitus ([place]) 10/2014   pt denies being diabetic.   . Emphysema of lung ([place])   . Emphysema/COPD ([place]) 10/2014  . Hepatic steatosis   . Thrombocytopenia ([place]) 09/2015   platelets in 120s.    Past Surgical History:  Procedure Laterality Date  . ESOPHAGOGASTRODUODENOSCOPY N/A 09/23/2015   Procedure: ESOPHAGOGASTRODUODENOSCOPY (EGD);  Surgeon: Ladene Artist, MD;  Location: [facility];  Service: Endoscopy;  Laterality: N/A;  . FOOT SURGERY    . INGUINAL HERNIA REPAIR Left 05/15/2018   Procedure: LEFT INGUINAL HERNIA REPAIR WITH MESH;  Surgeon: Erroll Luna, MD;  Location: [facility];  Service: General;  Laterality: Left;  . INSERTION OF MESH Left 05/15/2018   Procedure: INSERTION OF MESH;  Surgeon: Erroll Luna, MD;  Location: [facility];  Service: General;  Laterality: Left;  . UPPER GASTROINTESTINAL ENDOSCOPY      Social History:  reports that he has been smoking cigarettes. He has a 7.05 pack-year smoking history. He has never used smokeless tobacco. He reports current alcohol use. He reports that he does not use drugs.  No Known Allergies  Family History  Problem Relation Age of Onset  . Emphysema Mother   . Diabetes Mother   . Emphysema Father   . Cancer Sister        Stomach cancer  . Diabetes Sister   . Stomach cancer Sister   . Diabetes Brother   . Colon cancer Neg Hx   . Colon polyps Neg Hx   . Esophageal cancer Neg Hx   . Rectal cancer Neg Hx      Prior to Admission medications   Medication Sig Start Date End Date Taking? Authorizing Provider  albuterol (VENTOLIN HFA) 108 (90 Base) MCG/ACT inhaler Inhale 1-2 puffs into the lungs every 6 (six) hours as needed for wheezing or shortness of breath. 08/26/19  Yes Sagardia, Ines Bloomer, MD  atorvastatin (LIPITOR) 20 MG tablet Take 1 tablet (20 mg total) by mouth daily. 08/26/19  Yes Sagardia, Ines Bloomer, MD  budesonide-formoterol [facility]) 160-4.5 MCG/ACT inhaler Inhale 2 puffs into the lungs 2 (two) times daily. 08/26/19  Yes Sagardia, Ines Bloomer, MD  glipiZIDE ([place]) 5  MG tablet Take 1 tablet (5 mg total) by mouth daily with breakfast. 08/26/19 11/24/19 Yes Sagardia, Ines Bloomer, MD  metFORMIN (GLUCOPHAGE) 500 MG tablet Take 1 tablet (500 mg total) by mouth 2 (two) times daily with a meal. 08/26/19  Yes Sagardia, Ines Bloomer, MD  Multiple Vitamin (MULTIVITAMIN) capsule Take 1 capsule by mouth daily.   Yes [provider]  OXYGEN Inhale 2 L into the lungs daily as needed (SOB).    Yes [provider]  predniSONE (DELTASONE) 20 MG tablet Take 2 tablets (40 mg  total) by mouth daily. Patient not taking: Reported on 11/01/2019 02/10/19   Carlisle Cater, PA-C    Physical Exam: Vitals:   11/01/19 1430 11/01/19 1500 11/01/19 1515 11/01/19 1530  BP: (!) 119/93 127/74  139/84  Pulse: 76 78 74 80  Resp: 20 14 17 15   Temp:      SpO2: 97% 94% 95% 96%  Weight:      Height:       Wt Readings from Last 3 Encounters:  11/01/19 68 kg  08/26/19 66.3 kg  02/20/19 70.8 kg   Body mass index is 22.81 kg/m.  General: Thinly built, not in obvious distress HENT: Normocephalic, pupils equally reacting to light and accommodation.  No scleral pallor or icterus noted. Oral mucosa is moist.  Chest: Diminished present bilaterally, mild rhonchi.  Chest wall tenderness on palpation which is reproducible. CVS: S1 &S2 heard. No murmur.  Regular rate and rhythm. Abdomen: Soft, nontender, nondistended.  Bowel sounds are heard.  Liver is not palpable, no abdominal mass palpated Extremities: No cyanosis, clubbing or edema.  Peripheral pulses are palpable. Psych: Alert, awake and oriented, normal mood CNS:  No cranial nerve deficits.  Power equal in all extremities.   No cerebellar signs.   Skin: Warm and dry.  No rashes noted.  Labs on Admission:   CBC: Recent Labs  Lab 11/01/19 1047  WBC 6.7  HGB 17.6*  HCT 52.2*  MCV 97.0  PLT 99991111    Basic Metabolic Panel: Recent Labs  Lab 11/01/19 1047  NA 136  K 4.3  CL 100  CO2 27  GLUCOSE 199*  BUN 13  CREATININE 0.83  CALCIUM 9.2    Liver Function Tests: Recent Labs  Lab 11/01/19 1229  AST 21  ALT 22  ALKPHOS 66  BILITOT 1.3*  PROT 7.7  ALBUMIN 4.4   Recent Labs  Lab 11/01/19 1229  LIPASE 21   No results for input(s): AMMONIA in the last 168 hours.  Cardiac Enzymes: No results for input(s): CKTOTAL, CKMB, CKMBINDEX, TROPONINI in the last 168 hours.  BNP (last 3 results) No results for input(s): BNP in the last 8760 hours.  ProBNP (last 3 results) No results for input(s): PROBNP in the  last 8760 hours.  CBG: No results for input(s): GLUCAP in the last 168 hours.  Lipase     Component Value Date/Time   LIPASE 21 11/01/2019 1229     Drugs of Abuse  No results found for: LABOPIA, COCAINSCRNUR, LABBENZ, AMPHETMU, THCU, LABBARB    Radiological Exams on Admission: DG Chest 2 View  Result Date: 11/01/2019 CLINICAL DATA:  Shortness of breath and chest pain since yesterday morning. History of emphysema. EXAM: CHEST - 2 VIEW COMPARISON:  Chest x-ray dated 02/09/2019. FINDINGS: Heart size and mediastinal contours are stable. Lungs are hyperexpanded. Lungs are clear. No pleural effusion or pneumothorax is seen. No acute or suspicious osseous finding. IMPRESSION: 1. No active cardiopulmonary disease.  No evidence of pneumonia or pulmonary edema. 2. Hyperexpanded lungs suggesting COPD. Electronically Signed   By: Franki Cabot M.D.   On: 11/01/2019 10:46    EKG: Personally reviewed by me which shows normal sinus rhythm  Assessment/Plan Principal Problem:   Chest pain Active Problems:   COPD (chronic obstructive pulmonary disease) (HCC)   History of diet-controlled diabetes   Cigarette smoker  Chest pain.  High risk factors.  Heart score of 5.  Troponin x2 -.  Trend.  Will consider for myocardial perfusion scan.  Mild tenderness of the chest wall.  Apply warm compress, diclofenac gel.  Continue statins.  Add aspirin.  Check lipid profile, A1c, TSH  COPD active cigarette smoker.  Uses intermittent oxygen at home.  Will put patient on nicotine patch while in hospital.  Patient states that he is motivated to quit at some point but is cutting down on his cigarettes at this time.  Continue nebulizers oxygen.  Patient uses oxygen at nighttime and as needed at home.  No steroids for now.  Counseling done on quitting.  Diabetes mellitus type 2.  On glipizide and Metformin.  We will continue with sliding scale insulin while in the hospital.  Put the patient on diabetic diet,  Accu-Cheks.  History of hyperlipidemia.  Continue Lipitor.  DVT Prophylaxis: Prophylactic Lovenox subcu  Consultant: None  Code Status: Full code  Microbiology none  Antibiotics: None  Family Communication:  Patients' condition and plan of care including tests being ordered have been discussed with the patient and the patient's daughter at bedside who indicate understanding and agree with the plan.   Status is: Observation  The patient remains OBS appropriate and will d/c before 2 midnights.  Dispo: The patient is from: Home              Anticipated d/c is to: Home              Anticipated d/c date is: 1 day              Patient currently is not medically stable to d/c.  Severity of Illness: The appropriate patient status for this patient is OBSERVATION. Observation status is judged to be reasonable and necessary in order to provide the required intensity of service to ensure the patient's safety. The patient's presenting symptoms, physical exam findings, and initial radiographic and laboratory data in the context of their medical condition is felt to place them at decreased risk for further clinical deterioration. Furthermore, it is anticipated that the patient will be medically stable for discharge from the hospital within 2 midnights of admission. The following factors support the patient status of observation.   Signed, Flora Lipps, MD [facility] 11/01/2019

## 2019-11-01 NOTE — ED Provider Notes (Signed)
[facility] DEPT Provider Note   CSN: [id number] Arrival date & time: 11/01/19  1001     History Chief Complaint  Patient presents with  . Chest Pain    Duane Cuevas is a 64 y.o. male with a past medical history of DM, hyperlipidemia, COPD with emphysema, who presents today for evaluation of chest pain.  He reports that his chest pain woke him up yesterday morning at about 6 or 7 in the morning.  His pain is intermittent.  It is in the entire left side of his chest.  It does not radiate or move.  He reports that since he woke up with the chest pain he has been having more shortness of breath than usual.  He denies more coughing, his cough is not more productive and there is no significant change in sputum.  He denies any fevers.  He states that his pain is worse when he lays or sits.  His pain also worsens when he eats.  He denies swallowing any food or feeling like it got stuck in his throat.  He states that when the pain gets worse he feels nauseous and more short of breath however does not vomit.  He states that while his pain overall feels like a heavy pressure it also is throbbing.  He states that this feels very different from his previous COPD exacerbations.  He last used his albuterol at about 9 this morning without any significant change in his symptoms.  He reports compliance with his medications.  He reports that he has not smoked in the past 3 days as when he tries to he coughs more.  He reports that he has had both his Covid vaccines.  All interactions with patient performed using professional Spanish-speaking medical interpreter.  HPI    Past Medical History:  Diagnosis Date  . Diabetes mellitus ([place]) 10/2014   pt denies being diabetic.   . Emphysema of lung ([place])   . Emphysema/COPD ([place]) 10/2014  . Hepatic steatosis   . Thrombocytopenia ([place]) 09/2015   platelets in 120s.     Patient Active Problem List   Diagnosis Date Noted  .  Cigarette smoker 08/15/2018  . COPD ? GOLD III/ active smoker 08/14/2018  . History of diet-controlled diabetes 06/30/2018  . COPD (chronic obstructive pulmonary disease) ([place]) 10/14/2014    Past Surgical History:  Procedure Laterality Date  . ESOPHAGOGASTRODUODENOSCOPY N/A 09/23/2015   Procedure: ESOPHAGOGASTRODUODENOSCOPY (EGD);  Surgeon: Ladene Artist, MD;  Location: [facility];  Service: Endoscopy;  Laterality: N/A;  . FOOT SURGERY    . INGUINAL HERNIA REPAIR Left 05/15/2018   Procedure: LEFT INGUINAL HERNIA REPAIR WITH MESH;  Surgeon: Erroll Luna, MD;  Location: [facility];  Service: General;  Laterality: Left;  . INSERTION OF MESH Left 05/15/2018   Procedure: INSERTION OF MESH;  Surgeon: Erroll Luna, MD;  Location: [facility];  Service: General;  Laterality: Left;  . UPPER GASTROINTESTINAL ENDOSCOPY         Family History  Problem Relation Age of Onset  . Emphysema Mother   . Diabetes Mother   . Emphysema Father   . Cancer Sister        Stomach cancer  . Diabetes Sister   . Stomach cancer Sister   . Diabetes Brother   . Colon cancer Neg Hx   . Colon polyps Neg Hx   . Esophageal cancer Neg Hx   . Rectal cancer Neg Hx     Social History  Tobacco Use  . Smoking status: Current Every Day Smoker    Packs/day: 0.15    Years: 47.00    Pack years: 7.05    Types: Cigarettes  . Smokeless tobacco: Never Used  Substance Use Topics  . Alcohol use: Yes    Comment: occ  . Drug use: No    Home Medications Prior to Admission medications   Medication Sig Start Date End Date Taking? Authorizing Provider  albuterol (VENTOLIN HFA) 108 (90 Base) MCG/ACT inhaler Inhale 1-2 puffs into the lungs every 6 (six) hours as needed for wheezing or shortness of breath. 08/26/19  Yes Sagardia, Ines Bloomer, MD  atorvastatin (LIPITOR) 20 MG tablet Take 1 tablet (20 mg total) by mouth daily. 08/26/19  Yes Sagardia, Ines Bloomer, MD  budesonide-formoterol [facility]) 160-4.5 MCG/ACT inhaler  Inhale 2 puffs into the lungs 2 (two) times daily. 08/26/19  Yes Sagardia, Ines Bloomer, MD  glipiZIDE (GLUCOTROL) 5 MG tablet Take 1 tablet (5 mg total) by mouth daily with breakfast. 08/26/19 11/24/19 Yes Sagardia, Ines Bloomer, MD  metFORMIN (GLUCOPHAGE) 500 MG tablet Take 1 tablet (500 mg total) by mouth 2 (two) times daily with a meal. 08/26/19  Yes Sagardia, Ines Bloomer, MD  Multiple Vitamin (MULTIVITAMIN) capsule Take 1 capsule by mouth daily.   Yes [provider]  OXYGEN Inhale 2 L into the lungs daily as needed (SOB).    Yes [provider]  predniSONE (DELTASONE) 20 MG tablet Take 2 tablets (40 mg total) by mouth daily. Patient not taking: Reported on 11/01/2019 02/10/19   Carlisle Cater, PA-C    Allergies    Patient has no known allergies.  Review of Systems   Review of Systems  Constitutional: Negative for chills, diaphoresis, fatigue and fever.  HENT: Negative for congestion.   Eyes: Negative for visual disturbance.  Respiratory: Positive for cough, chest tightness and shortness of breath.   Cardiovascular: Positive for chest pain and leg swelling (Reports that right leg is always swollen when compared to right from previous accident.  ).  Gastrointestinal: Positive for nausea. Negative for abdominal pain.  Genitourinary: Negative for dysuria.  Musculoskeletal: Negative for back pain and neck pain.  Skin: Negative for color change, rash and wound.  Neurological: Negative for weakness and headaches.  Psychiatric/Behavioral: Negative for confusion.  All other systems reviewed and are negative.   Physical Exam Updated Vital Signs BP (!) 119/93   Pulse 76   Temp 98.3 F (36.8 C)   Resp 20   Ht 5\' 8"  (1.727 m)   Wt 68 kg   SpO2 97%   BMI 22.81 kg/m   Physical Exam Vitals and nursing note reviewed.  Constitutional:      General: He is not in acute distress.    Appearance: He is well-developed and normal weight.  HENT:     Head: Normocephalic and  atraumatic.  Eyes:     Conjunctiva/sclera: Conjunctivae normal.  Cardiovascular:     Rate and Rhythm: Normal rate and regular rhythm.     Heart sounds: Normal heart sounds. No murmur.  Pulmonary:     Effort: Pulmonary effort is normal. No tachypnea, accessory muscle usage or respiratory distress.     Breath sounds: Examination of the right-upper field reveals wheezing. Examination of the left-lower field reveals wheezing. Wheezing (Faint, end expiratory, clears with coughing) present.  Chest:     Chest wall: Tenderness (Palpation over bilateral sternocostal joints, however he says his pain is different.  ) present. No mass, deformity  or edema.  Abdominal:     Palpations: Abdomen is soft.     Tenderness: There is no abdominal tenderness.  Musculoskeletal:     Cervical back: Normal range of motion and neck supple.     Right lower leg: No tenderness. No edema (Non pitting).     Left lower leg: No tenderness. No edema.  Skin:    General: Skin is warm and dry.  Neurological:     General: No focal deficit present.     Mental Status: He is alert.  Psychiatric:        Mood and Affect: Mood normal.        Behavior: Behavior normal.     ED Results / Procedures / Treatments   Labs (all labs ordered are listed, but only abnormal results are displayed) Labs Reviewed  BASIC METABOLIC PANEL - Abnormal; Notable for the following components:      Result Value   Glucose, Bld 199 (*)    All other components within normal limits  CBC - Abnormal; Notable for the following components:   Hemoglobin 17.6 (*)    HCT 52.2 (*)    All other components within normal limits  HEPATIC FUNCTION PANEL - Abnormal; Notable for the following components:   Total Bilirubin 1.3 (*)    Indirect Bilirubin 1.1 (*)    All other components within normal limits  RESPIRATORY PANEL BY RT PCR (FLU A&B, COVID)  LIPASE, BLOOD  D-DIMER, QUANTITATIVE (NOT AT [facility])  TROPONIN I (HIGH SENSITIVITY)  TROPONIN I (HIGH  SENSITIVITY)    EKG EKG Interpretation  Date/Time:  Friday November 01 2019 10:17:33 EDT Ventricular Rate:  93 PR Interval:    QRS Duration: 93 QT Interval:  361 QTC Calculation: 449 R Axis:   62 Text Interpretation: Sinus rhythm Borderline short PR interval Biatrial enlargement Minimal ST elevation, anterior leads No significant change since last tracing Confirmed by Dorie Rank [phone]) on 11/01/2019 12:00:08 PM   Radiology DG Chest 2 View  Result Date: 11/01/2019 CLINICAL DATA:  Shortness of breath and chest pain since yesterday morning. History of emphysema. EXAM: CHEST - 2 VIEW COMPARISON:  Chest x-ray dated 02/09/2019. FINDINGS: Heart size and mediastinal contours are stable. Lungs are hyperexpanded. Lungs are clear. No pleural effusion or pneumothorax is seen. No acute or suspicious osseous finding. IMPRESSION: 1. No active cardiopulmonary disease. No evidence of pneumonia or pulmonary edema. 2. Hyperexpanded lungs suggesting COPD. Electronically Signed   By: Franki Cabot M.D.   On: 11/01/2019 10:46    Procedures Procedures (including critical care time)  Medications Ordered in ED Medications  morphine 4 MG/ML injection 4 mg (has no administration in time range)  albuterol (VENTOLIN HFA) 108 (90 Base) MCG/ACT inhaler 4 puff (4 puffs Inhalation Given 11/01/19 1400)  AeroChamber Plus Flo-Vu Medium MISC 1 each (1 each Other Given 11/01/19 1401)    ED Course  I have reviewed the triage vital signs and the nursing notes.  Pertinent labs & imaging results that were available during my care of the patient were reviewed by me and considered in my medical decision making (see chart for details).    MDM Rules/Calculators/A&P HEAR Score: 5                   Patient is a 64 year old man who presents today for evaluation of chest pain.  His chest pain woke him up at 6 AM yesterday.  He reports that the pain is intermittent and when it is worse  he feels more short of breath, gets nauseous  without vomiting.  He states this feels different from his prior chest pains related to COPD exacerbation.  He does have pain with palpation over the anterior chest but reports his pain is different with palpation than what he is normally experiencing.    Labs obtained and reviewed, troponin x2 is not elevated however his pain is intermittent in nature and concern that this may be an anginal type pain.  CBC and BMP show hyperglycemia without evidence of significant electrolyte or hematologic derangements.  D-dimers not elevated.    I spoke with hospitalist.  Patient will be admitted for continued evaluation.   Heart score is elevated at 5.  Given that his pain is intermittent two troponin does not adequately rule out cardiac causes given his risk factors.   Note: Portions of this report may have been transcribed using voice recognition software. Every effort was made to ensure accuracy; however, inadvertent computerized transcription errors may be present  Final Clinical Impression(s) / ED Diagnoses Final diagnoses:  Chest pain, unspecified type    Rx / DC Orders ED Discharge Orders    None       [name] 11/01/19 1548    Dorie Rank, MD 11/03/19 1731

## 2019-11-01 NOTE — ED Triage Notes (Signed)
Patient c/o intermittent left chest pain since yesterday. Patient reports a history of emphysema. Patient denies any N/v/d.

## 2019-11-02 ENCOUNTER — Ambulatory Visit (HOSPITAL_BASED_OUTPATIENT_CLINIC_OR_DEPARTMENT_OTHER)
Admit: 2019-11-02 | Discharge: 2019-11-02 | Disposition: A | Discharge status: Home / Self Care | Payer: Self-pay | Attending: Internal Medicine | Admitting: Internal Medicine

## 2019-11-02 DIAGNOSIS — E785 Hyperlipidemia, unspecified: Secondary | ICD-10-CM | POA: Diagnosis present

## 2019-11-02 DIAGNOSIS — I252 Old myocardial infarction: Secondary | ICD-10-CM

## 2019-11-02 DIAGNOSIS — R079 Chest pain, unspecified: Secondary | ICD-10-CM

## 2019-11-02 LAB — CBC
HCT: 48.6 % (ref 39.0–52.0)
Hemoglobin: 16.3 g/dL (ref 13.0–17.0)
MCH: 32.9 pg (ref 26.0–34.0)
MCHC: 33.5 g/dL (ref 30.0–36.0)
MCV: 98 fL (ref 80.0–100.0)
Platelets: 208 10*3/uL (ref 150–400)
RBC: 4.96 MIL/uL (ref 4.22–5.81)
RDW: 12.8 % (ref 11.5–15.5)
WBC: 6.4 10*3/uL (ref 4.0–10.5)
nRBC: 0 % (ref 0.0–0.2)

## 2019-11-02 LAB — NM MYOCAR MULTI W/SPECT W/WALL MOTION / EF
Estimated workload: 1 METS
Exercise duration (min): 0 min
Exercise duration (sec): 0 s
MPHR: 156 {beats}/min
Peak HR: 112 {beats}/min
Percent HR: 71 %
Rest HR: 77 {beats}/min

## 2019-11-02 LAB — COMPREHENSIVE METABOLIC PANEL WITH GFR
ALT: 19 U/L (ref 0–44)
AST: 18 U/L (ref 15–41)
Albumin: 3.8 g/dL (ref 3.5–5.0)
Alkaline Phosphatase: 53 U/L (ref 38–126)
Anion gap: 8 (ref 5–15)
BUN: 18 mg/dL (ref 8–23)
CO2: 27 mmol/L (ref 22–32)
Calcium: 8.9 mg/dL (ref 8.9–10.3)
Chloride: 94 mmol/L — ABNORMAL LOW (ref 98–111)
Creatinine, Ser: 0.78 mg/dL (ref 0.61–1.24)
GFR calc Af Amer: >60 mL/min
GFR calc non Af Amer: >60 mL/min
Glucose, Bld: 124 mg/dL — ABNORMAL HIGH (ref 70–99)
Potassium: 4.2 mmol/L (ref 3.5–5.1)
Sodium: 129 mmol/L — ABNORMAL LOW (ref 135–145)
Total Bilirubin: 1.7 mg/dL — ABNORMAL HIGH (ref 0.3–1.2)
Total Protein: 6.7 g/dL (ref 6.5–8.1)

## 2019-11-02 LAB — LIPID PANEL
Cholesterol: 156 mg/dL (ref 0–200)
HDL: 50 mg/dL (ref >40)
LDL Cholesterol: 93 mg/dL (ref 0–99)
Total CHOL/HDL Ratio: 3.1 RATIO
Triglycerides: 65 mg/dL (ref <150)
VLDL: 13 mg/dL (ref 0–40)

## 2019-11-02 LAB — MAGNESIUM: Magnesium: 2 mg/dL (ref 1.7–2.4)

## 2019-11-02 LAB — HEMOGLOBIN A1C
Hgb A1c MFr Bld: 6.8 % — ABNORMAL HIGH (ref 4.8–5.6)
Mean Plasma Glucose: 148.46 mg/dL

## 2019-11-02 MED ORDER — NICOTINE 14 MG/24HR TD PT24: 14.0000 mg | MEDICATED_PATCH | Freq: Every day | TRANSDERMAL | 0 refills | Status: DC

## 2019-11-02 MED ORDER — TECHNETIUM TC 99M TETROFOSMIN IV KIT
9.0000 | PACK | Freq: Once | INTRAVENOUS | Status: AC | PRN
  Administered 2019-11-02: 9.9 via INTRAVENOUS

## 2019-11-02 MED ORDER — REGADENOSON 0.4 MG/5ML IV SOLN
0.4000 mg | Freq: Once | INTRAVENOUS | Status: DC
  Filled 2019-11-02: qty 5

## 2019-11-02 MED ORDER — ASPIRIN 81 MG PO TBEC: 81.0000 mg | DELAYED_RELEASE_TABLET | Freq: Every day | ORAL | 1 refills | Status: DC

## 2019-11-02 MED ORDER — TECHNETIUM TC 99M TETROFOSMIN IV KIT
29.8000 | PACK | Freq: Once | INTRAVENOUS | Status: AC | PRN
  Administered 2019-11-02: 29.8 via INTRAVENOUS

## 2019-11-02 MED ORDER — REGADENOSON 0.4 MG/5ML IV SOLN
INTRAVENOUS | Status: AC
  Administered 2019-11-02: 12:00:00 0.4 mg
  Filled 2019-11-02: qty 5

## 2019-11-02 NOTE — ED Notes (Signed)
Pt to go to [facility] for stress test. Report called to [place] at [employer]. [facility] notified. Pt and son at bedside made aware he is going for stress test and will return to [facility] for admission. No further questions at this time. Denies chest pain or SHOB. Cont to monitor.

## 2019-11-02 NOTE — Progress Notes (Signed)
   Duane Cuevas presented for a 1-day Lexiscan nuclear stress test today.  No immediate complications.  Stress imaging is pending at this time.  Preliminary EKG findings may be listed in the chart, but the stress test result will not be finalized until perfusion imaging is complete.  Erma Heritage, PA-C  11/02/2019, 12:38 PM

## 2019-11-02 NOTE — ED Notes (Addendum)
Enters my care. Report from night RN. Pt resting in stretcher. Sts chest pain much improved. Denies SHOB. Skin w/d/pink. Resp wnl, equal and non-labored. Morning meds given a/o. Ginger ale given. Son arrives at bedside to visit. Awaits admission room. VSS. NAD. No complaints voiced. Cont to monitor.

## 2019-11-02 NOTE — ED Notes (Signed)
Pt sitting up in bed. Family at bedside. Pain meds given. Will continue to monitor.

## 2019-11-02 NOTE — ED Notes (Signed)
Pt lying in bed, eyes closed chest rising and falling. Pt awakens easily. NAD noted. Denies any needs. Will continue to monitor.

## 2019-11-02 NOTE — ED Notes (Signed)
Pt will not receive 10am meds as he needs to remain NPO for stress test.

## 2019-11-02 NOTE — Discharge Summary (Signed)
Discharge Summary  Duane Cuevas [id number] DOB: 1955-08-15  PCP: [name], MD  Admit date: 11/01/2019 Discharge date: 11/02/2019  Time spent: 25 minutes  Recommendations for Outpatient Follow-up:  1. New medication: Aspirin 81 mg p.o. daily  Discharge Diagnoses:  Active Hospital Problems   Diagnosis Date Noted  . Chest pain 11/01/2019  . Hyperlipidemia 11/02/2019  . Cigarette smoker 08/15/2018  . History of diet-controlled diabetes 06/30/2018  . COPD (chronic obstructive pulmonary disease) ([place]) 10/14/2014    Resolved Hospital Problems  No resolved problems to display.    Discharge Condition: Improved, being discharged home  Diet recommendation: Heart healthy  Vitals:   11/02/19 1231 11/02/19 1401  BP: (!) 143/88 127/87  Pulse:  88  Resp:  18  Temp:  98.1 F (36.7 C)  SpO2:  100%    History of present illness:  64 year old with past medical history of diabetes mellitus, well controlled, ongoing tobacco abuse and hyperlipidemia presented to the hospital on morning of 4/30 with complaints of intermittent chest pain that started on the early morning of 4/30.  In the emergency room, patient's white count noted to be elevated at 17.6 but heart score was at 5 2 is considered for observation the hospital.  Troponins were negative.  EKG noted no evidence of acute ischemic change.  Hospital Course:  Principal Problem:   Chest pain: Observed overnight.  Subsequent troponins unremarkable.  Patient underwent a nuclear medicine stress test on morning of 4/30.  Patient responded well to IV Lasix  Seen by cardiology stress test noted no ST segment deviation or T wave inversion.  Low risk study with ejection fraction normal.  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  This was discussed with the patient.  We will continue his statin and his diabetes under good control.  Encouraged to quit smoking.  Started on aspirin daily.  He will  follow-up with his PCP as scheduled in a few weeks.  This particular episode of chest pain not felt to be cardiac in nature.  May have been musculoskeletal versus bronchospasm given the high amount of pollen seen lately.  He will watch this and follow-up with his PCP. Active Problems:   COPD (chronic obstructive pulmonary disease) ([place]): Stable.    History of diet-controlled diabetes: A1c notes good control at 6.8.    Cigarette smoker: Encouraged to quit, prescription given for nicotine patch.    Hyperlipidemia: Continue statin.  Procedures:  Nuclear medicine stress test: no ST segment deviation or T wave inversion.  Low risk study with ejection fraction normal.  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  Consultations:  Cardiology  Discharge Exam: BP 127/87 (BP Location: Left Arm)   Pulse 88   Temp 98.1 F (36.7 C)   Resp 18   Ht 5\' 8"  (1.727 m)   Wt 69.5 kg   SpO2 100%   BMI 23.30 kg/m   General: Alert and oriented x3, no acute distress Cardiovascular: Regular rate and rhythm, S1-S2 Respiratory: Clear to auscultation bilaterally  Discharge Instructions You were cared for by a hospitalist during your hospital stay. If you have any questions about your discharge medications or the care you received while you were in the hospital after you are discharged, you can call the unit and asked to speak with the hospitalist on call if the hospitalist that took care of you is not available. Once you are discharged, your primary care physician will handle any further  medical issues. Please note that NO REFILLS for any discharge medications will be authorized once you are discharged, as it is imperative that you return to your primary care physician (or establish a relationship with a primary care physician if you do not have one) for your aftercare needs so that they can reassess your need for medications and monitor your lab values.  Discharge Instructions      Diet - low sodium heart healthy   Complete by: As directed    Increase activity slowly   Complete by: As directed      Allergies as of 11/02/2019   No Known Allergies     Medication List    TAKE these medications   albuterol 108 (90 Base) MCG/ACT inhaler Commonly known as: VENTOLIN HFA Inhale 1-2 puffs into the lungs every 6 (six) hours as needed for wheezing or shortness of breath.   aspirin 81 MG EC tablet Take 1 tablet (81 mg total) by mouth daily. Start taking on: Nov 03, 2019   atorvastatin 20 MG tablet Commonly known as: LIPITOR Take 1 tablet (20 mg total) by mouth daily.   budesonide-formoterol 160-4.5 MCG/ACT inhaler Commonly known as: Symbicort Inhale 2 puffs into the lungs 2 (two) times daily.   glipiZIDE 5 MG tablet Commonly known as: GLUCOTROL Take 1 tablet (5 mg total) by mouth daily with breakfast.   metFORMIN 500 MG tablet Commonly known as: GLUCOPHAGE Take 1 tablet (500 mg total) by mouth 2 (two) times daily with a meal.   multivitamin capsule Take 1 capsule by mouth daily.   nicotine 14 mg/24hr patch Commonly known as: NICODERM CQ - dosed in mg/24 hours Place 1 patch (14 mg total) onto the skin daily. Start taking on: Nov 03, 2019   OXYGEN Inhale 2 L into the lungs daily as needed (SOB).      No Known Allergies Follow-up Information    [name], MD Follow up in 1 month(s).   Specialty: Internal Medicine Why: Keep scheduled appointment for later this month Contact information: [place] [address] [phone]            The results of significant diagnostics from this hospitalization (including imaging, microbiology, ancillary and laboratory) are listed below for reference.    Significant Diagnostic Studies: DG Chest 2 View  Result Date: 11/01/2019 CLINICAL DATA:  Shortness of breath and chest pain since yesterday morning. History of emphysema. EXAM: CHEST - 2 VIEW COMPARISON:  Chest x-ray dated  02/09/2019. FINDINGS: Heart size and mediastinal contours are stable. Lungs are hyperexpanded. Lungs are clear. No pleural effusion or pneumothorax is seen. No acute or suspicious osseous finding. IMPRESSION: 1. No active cardiopulmonary disease. No evidence of pneumonia or pulmonary edema. 2. Hyperexpanded lungs suggesting COPD. Electronically Signed   By: Franki Cabot M.D.   On: 11/01/2019 10:46   NM Myocar Multi W/Spect W/Wall Motion / EF  Result Date: 11/02/2019  There was no ST segment deviation noted during stress.  No T wave inversion was noted during stress.  Defect 1: There is a large defect of severe severity present in the basal inferior and mid inferior location.  This is a low risk study.  The left ventricular ejection fraction is normal (55-65%).  Nuclear stress EF: 58%.  Abnormal, low risk stress nuclear study with prior inferior infarct; no ischemia; EF 58 with normal wall motion.    Microbiology: Recent Results (from the past 240 hour(s))  Respiratory Panel by RT PCR (Flu A&B,  Covid) - Nasopharyngeal Swab     Status: None   Collection Time: 11/01/19  1:32 PM   Specimen: Nasopharyngeal Swab  Result Value Ref Range Status   SARS Coronavirus 2 by RT PCR NEGATIVE NEGATIVE Final    Comment: (NOTE) SARS-CoV-2 target nucleic acids are NOT DETECTED. The SARS-CoV-2 RNA is generally detectable in upper respiratoy specimens during the acute phase of infection. The lowest concentration of SARS-CoV-2 viral copies this assay can detect is 131 copies/mL. A negative result does not preclude SARS-Cov-2 infection and should not be used as the sole basis for treatment or other patient management decisions. A negative result may occur with  improper specimen collection/handling, submission of specimen other than nasopharyngeal swab, presence of viral mutation(s) within the areas targeted by this assay, and inadequate number of viral copies (<131 copies/mL). A negative result must be  combined with clinical observations, patient history, and epidemiological information. The expected result is Negative. Fact Sheet for Patients:  PinkCheek.be Fact Sheet for Healthcare Providers:  GravelBags.it This test is not yet ap proved or cleared by the [place] FDA and  has been authorized for detection and/or diagnosis of SARS-CoV-2 by FDA under an Emergency Use Authorization (EUA). This EUA will remain  in effect (meaning this test can be used) for the duration of the COVID-19 declaration under Section 564(b)(1) of the Act, 21 U.S.C. section 360bbb-3(b)(1), unless the authorization is terminated or revoked sooner.    Influenza A by PCR NEGATIVE NEGATIVE Final   Influenza B by PCR NEGATIVE NEGATIVE Final    Comment: (NOTE) The Xpert Xpress SARS-CoV-2/FLU/RSV assay is intended as an aid in  the diagnosis of influenza from Nasopharyngeal swab specimens and  should not be used as a sole basis for treatment. Nasal washings and  aspirates are unacceptable for Xpert Xpress SARS-CoV-2/FLU/RSV  testing. Fact Sheet for Patients: PinkCheek.be Fact Sheet for Healthcare Providers: GravelBags.it This test is not yet approved or cleared by the [place] FDA and  has been authorized for detection and/or diagnosis of SARS-CoV-2 by  FDA under an Emergency Use Authorization (EUA). This EUA will remain  in effect (meaning this test can be used) for the duration of the  Covid-19 declaration under Section 564(b)(1) of the Act, 21  U.S.C. section 360bbb-3(b)(1), unless the authorization is  terminated or revoked. Performed at [facility], [place] [address] [place], [place] [address]      Labs: Basic Metabolic Panel: Recent Labs  Lab 11/01/19 1047 11/02/19 0558  NA 136 129*  K 4.3 4.2  CL 100 94*  CO2 27 27  GLUCOSE 199* 124*  BUN 13 18    CREATININE 0.83 0.78  CALCIUM 9.2 8.9  MG  --  2.0   Liver Function Tests: Recent Labs  Lab 11/01/19 1229 11/02/19 0558  AST 21 18  ALT 22 19  ALKPHOS 66 53  BILITOT 1.3* 1.7*  PROT 7.7 6.7  ALBUMIN 4.4 3.8   Recent Labs  Lab 11/01/19 1229  LIPASE 21   No results for input(s): AMMONIA in the last 168 hours. CBC: Recent Labs  Lab 11/01/19 1047 11/02/19 0558  WBC 6.7 6.4  HGB 17.6* 16.3  HCT 52.2* 48.6  MCV 97.0 98.0  PLT 245 208   Cardiac Enzymes: No results for input(s): CKTOTAL, CKMB, CKMBINDEX, TROPONINI in the last 168 hours. BNP: BNP (last 3 results) No results for input(s): BNP in the last 8760 hours.  ProBNP (last 3 results) No results for input(s): PROBNP  in the last 8760 hours.  CBG: No results for input(s): GLUCAP in the last 168 hours.     Signed:  Annita Brod, MD [facility] 11/02/2019, 4:33 PM

## 2019-11-02 NOTE — ED Notes (Signed)
Pt sitting up in bed. Speaks some english. Family at bedside speaks [place] well. Pt asking for more pain meds. Will contact MD. NAD noted.

## 2019-11-02 NOTE — Progress Notes (Signed)
Reviewed Spanish AVS with patient.  He asked for his prescription for Nicotine patches to be send to [facility] rather than [facility].  I called [facility] to make that transfer for him but they said he was in their system as having no insurance and so it would be cheaper for him to buy these over the counter.  He voiced his understanding of this.  I also gave him a copy of the AVS in English for his daughter.  I answered all his questions.  Patient was alert and oriented.   Skin Warm and dry.  He discharged via wheelchair to his daughter's car and was helped inside with no complaints or concerns.

## 2019-11-02 NOTE — ED Notes (Signed)
Printed Med necessity per Dr. Gilford Raid.

## 2019-11-25 ENCOUNTER — Ambulatory Visit (INDEPENDENT_AMBULATORY_CARE_PROVIDER_SITE_OTHER): Payer: Self-pay | Admitting: Emergency Medicine

## 2019-11-25 ENCOUNTER — Encounter: Payer: Self-pay | Admitting: Emergency Medicine

## 2019-11-25 ENCOUNTER — Other Ambulatory Visit: Payer: Self-pay

## 2019-11-25 VITALS — BP 133/80 | HR 103 | Temp 98.5°F | Ht 69.0 in | Wt 160.4 lb

## 2019-11-25 DIAGNOSIS — E1165 Type 2 diabetes mellitus with hyperglycemia: Secondary | ICD-10-CM

## 2019-11-25 DIAGNOSIS — J449 Chronic obstructive pulmonary disease, unspecified: Secondary | ICD-10-CM

## 2019-11-25 MED ORDER — ALBUTEROL SULFATE (2.5 MG/3ML) 0.083% IN NEBU: 2.5000 mg | INHALATION_SOLUTION | Freq: Four times a day (QID) | RESPIRATORY_TRACT | 11 refills | Status: DC | PRN

## 2019-11-25 NOTE — Progress Notes (Signed)
Duane Cuevas 64 y.o.   Chief Complaint  Patient presents with  . Diabetes    f/u   . COPD  . Asthma    HISTORY OF PRESENT ILLNESS: This is a 64 y.o. male with history of diabetes and COPD here for follow-up. 1.  Diabetes: On Metformin 500 mg twice a day and glipizide 5 mg twice a day. 2.  COPD: Quit smoking 1 month ago.  Doing well.  On Symbicort and albuterol as needed. Recently in the emergency department on 11/01/2019 with atypical chest pain.  Nuclear stress test done with report as follows:  There was no ST segment deviation noted during stress.  No T wave inversion was noted during stress.  Defect 1: There is a large defect of severe severity present in the basal inferior and mid inferior location.  This is a low risk study.  The left ventricular ejection fraction is normal (55-65%).  Nuclear stress EF: 58%. Fully vaccinated against Covid.  States he is gained some weight. Wt Readings from Last 3 Encounters:  11/25/19 160 lb 6.4 oz (72.8 kg)  11/02/19 153 lb 3.5 oz (69.5 kg)  08/26/19 146 lb 3.2 oz (66.3 kg)    Blood work as follows: Recent Results (from the past 2160 hour(s))  Basic metabolic panel     Status: Abnormal   Collection Time: 11/01/19 10:47 AM  Result Value Ref Range   Sodium 136 135 - 145 mmol/L   Potassium 4.3 3.5 - 5.1 mmol/L   Chloride 100 98 - 111 mmol/L   CO2 27 22 - 32 mmol/L   Glucose, Bld 199 (H) 70 - 99 mg/dL    Comment: Glucose reference range applies only to samples taken after fasting for at least 8 hours.   BUN 13 8 - 23 mg/dL   Creatinine, Ser 0.83 0.61 - 1.24 mg/dL   Calcium 9.2 8.9 - 10.3 mg/dL   GFR calc non Af Amer >60 >60 mL/min   GFR calc Af Amer >60 >60 mL/min   Anion gap 9 5 - 15    Comment: Performed at [facility], [place] [address], [place], [place] [address]  CBC     Status: Abnormal   Collection Time: 11/01/19 10:47 AM  Result Value Ref Range   WBC 6.7 4.0 - 10.5 K/uL   RBC 5.38 4.22 - 5.81  MIL/uL   Hemoglobin 17.6 (H) 13.0 - 17.0 g/dL   HCT 52.2 (H) 39.0 - 52.0 %   MCV 97.0 80.0 - 100.0 fL   MCH 32.7 26.0 - 34.0 pg   MCHC 33.7 30.0 - 36.0 g/dL   RDW 12.8 11.5 - 15.5 %   Platelets 245 150 - 400 K/uL   nRBC 0.0 0.0 - 0.2 %    Comment: Performed at [facility], [place] [address] [place], [place] [address]  Troponin I (High Sensitivity)     Status: None   Collection Time: 11/01/19 10:47 AM  Result Value Ref Range   Troponin I (High Sensitivity) <2 <18 ng/L    Comment: (NOTE) Elevated high sensitivity troponin I (hsTnI) values and significant  changes across serial measurements may suggest ACS but many other  chronic and acute conditions are known to elevate hsTnI results.  Refer to the "Links" section for chest pain algorithms and additional  guidance. Performed at [facility], [place] [address] [place], [place] [address]   Troponin I (High Sensitivity)     Status: None   Collection  Time: 11/01/19 12:29 PM  Result Value Ref Range   Troponin I (High Sensitivity) 2 <18 ng/L    Comment: (NOTE) Elevated high sensitivity troponin I (hsTnI) values and significant  changes across serial measurements may suggest ACS but many other  chronic and acute conditions are known to elevate hsTnI results.  Refer to the "Links" section for chest pain algorithms and additional  guidance. Performed at [facility], [place] [address], [place], [place] [address]   Hepatic function panel     Status: Abnormal   Collection Time: 11/01/19 12:29 PM  Result Value Ref Range   Total Protein 7.7 6.5 - 8.1 g/dL   Albumin 4.4 3.5 - 5.0 g/dL   AST 21 15 - 41 U/L   ALT 22 0 - 44 U/L   Alkaline Phosphatase 66 38 - 126 U/L   Total Bilirubin 1.3 (H) 0.3 - 1.2 mg/dL   Bilirubin, Direct 0.2 0.0 - 0.2 mg/dL   Indirect Bilirubin 1.1 (H) 0.3 - 0.9 mg/dL    Comment: Performed at [facility], [place] [address], [place], [place]  [address]  Lipase, blood     Status: None   Collection Time: 11/01/19 12:29 PM  Result Value Ref Range   Lipase 21 11 - 51 U/L    Comment: Performed at [facility], [place] [address], [place], [place] [address]  D-dimer, quantitative (not at [facility])     Status: None   Collection Time: 11/01/19 12:29 PM  Result Value Ref Range   D-Dimer, Quant 0.45 0.00 - 0.50 ug/mL-FEU    Comment: (NOTE) At the manufacturer cut-off of 0.50 ug/mL FEU, this assay has been documented to exclude PE with a sensitivity and negative predictive value of 97 to 99%.  At this time, this assay has not been approved by the FDA to exclude DVT/VTE. Results should be correlated with clinical presentation. Performed at [facility], [place] [address] [place], [place] [address]   Respiratory Panel by RT PCR (Flu A&B, Covid) - Nasopharyngeal Swab     Status: None   Collection Time: 11/01/19  1:32 PM   Specimen: Nasopharyngeal Swab  Result Value Ref Range   SARS Coronavirus 2 by RT PCR NEGATIVE NEGATIVE    Comment: (NOTE) SARS-CoV-2 target nucleic acids are NOT DETECTED. The SARS-CoV-2 RNA is generally detectable in upper respiratoy specimens during the acute phase of infection. The lowest concentration of SARS-CoV-2 viral copies this assay can detect is 131 copies/mL. A negative result does not preclude SARS-Cov-2 infection and should not be used as the sole basis for treatment or other patient management decisions. A negative result may occur with  improper specimen collection/handling, submission of specimen other than nasopharyngeal swab, presence of viral mutation(s) within the areas targeted by this assay, and inadequate number of viral copies (<131 copies/mL). A negative result must be combined with clinical observations, patient history, and epidemiological information. The expected result is Negative. Fact Sheet for Patients:  PinkCheek.be Fact  Sheet for Healthcare Providers:  GravelBags.it This test is not yet ap proved or cleared by the [place] FDA and  has been authorized for detection and/or diagnosis of SARS-CoV-2 by FDA under an Emergency Use Authorization (EUA). This EUA will remain  in effect (meaning this test can be used) for the duration of the COVID-19 declaration under Section 564(b)(1) of the Act, 21 U.S.C. section 360bbb-3(b)(1), unless the authorization is terminated or revoked sooner.    Influenza A by PCR  NEGATIVE NEGATIVE   Influenza B by PCR NEGATIVE NEGATIVE    Comment: (NOTE) The Xpert Xpress SARS-CoV-2/FLU/RSV assay is intended as an aid in  the diagnosis of influenza from Nasopharyngeal swab specimens and  should not be used as a sole basis for treatment. Nasal washings and  aspirates are unacceptable for Xpert Xpress SARS-CoV-2/FLU/RSV  testing. Fact Sheet for Patients: PinkCheek.be Fact Sheet for Healthcare Providers: GravelBags.it This test is not yet approved or cleared by the [place] FDA and  has been authorized for detection and/or diagnosis of SARS-CoV-2 by  FDA under an Emergency Use Authorization (EUA). This EUA will remain  in effect (meaning this test can be used) for the duration of the  Covid-19 declaration under Section 564(b)(1) of the Act, 21  U.S.C. section 360bbb-3(b)(1), unless the authorization is  terminated or revoked. Performed at [facility], [place] [address], [place], [place] [address]   HIV Antibody (routine testing w rflx)     Status: None   Collection Time: 11/01/19  4:19 PM  Result Value Ref Range   HIV Screen 4th Generation wRfx Non Reactive Non Reactive    Comment: Performed at [facility], [place] [address] [place], [place] [address]  Troponin I (High Sensitivity)     Status: None   Collection Time: 11/01/19  4:19 PM  Result Value Ref Range    Troponin I (High Sensitivity) 3 <18 ng/L    Comment: (NOTE) Elevated high sensitivity troponin I (hsTnI) values and significant  changes across serial measurements may suggest ACS but many other  chronic and acute conditions are known to elevate hsTnI results.  Refer to the "Links" section for chest pain algorithms and additional  guidance. Performed at [facility], [place] [address], [place], [place] [address]   Lipid panel     Status: Abnormal   Collection Time: 11/01/19  4:19 PM  Result Value Ref Range   Cholesterol 182 0 - 200 mg/dL   Triglycerides 87 <150 mg/dL   HDL 55 >40 mg/dL   Total CHOL/HDL Ratio 3.3 RATIO   VLDL 17 0 - 40 mg/dL   LDL Cholesterol 110 (H) 0 - 99 mg/dL    Comment:        Total Cholesterol/HDL:CHD Risk Coronary Heart Disease Risk Table                     Men   Women  1/2 Average Risk   3.4   3.3  Average Risk       5.0   4.4  2 X Average Risk   9.6   7.1  3 X Average Risk  23.4   11.0        Use the calculated Patient Ratio above and the CHD Risk Table to determine the patient's CHD Risk.        ATP III CLASSIFICATION (LDL):  <100     mg/dL   Optimal  100-129  mg/dL   Near or Above                    Optimal  130-159  mg/dL   Borderline  160-189  mg/dL   High  >190     mg/dL   Very High Performed at [place] [address] [place], [place] [address]   Troponin I (High Sensitivity)     Status: None   Collection Time: 11/01/19  6:19 PM  Result Value Ref  Range   Troponin I (High Sensitivity) 3 <18 ng/L    Comment: (NOTE) Elevated high sensitivity troponin I (hsTnI) values and significant  changes across serial measurements may suggest ACS but many other  chronic and acute conditions are known to elevate hsTnI results.  Refer to the "Links" section for chest pain algorithms and additional  guidance. Performed at [facility], [place] [address], [place], [place] [address]     Comprehensive metabolic panel     Status: Abnormal   Collection Time: 11/02/19  5:58 AM  Result Value Ref Range   Sodium 129 (L) 135 - 145 mmol/L    Comment: DELTA CHECK NOTED   Potassium 4.2 3.5 - 5.1 mmol/L   Chloride 94 (L) 98 - 111 mmol/L   CO2 27 22 - 32 mmol/L   Glucose, Bld 124 (H) 70 - 99 mg/dL    Comment: Glucose reference range applies only to samples taken after fasting for at least 8 hours.   BUN 18 8 - 23 mg/dL   Creatinine, Ser 0.78 0.61 - 1.24 mg/dL   Calcium 8.9 8.9 - 10.3 mg/dL   Total Protein 6.7 6.5 - 8.1 g/dL   Albumin 3.8 3.5 - 5.0 g/dL   AST 18 15 - 41 U/L   ALT 19 0 - 44 U/L   Alkaline Phosphatase 53 38 - 126 U/L   Total Bilirubin 1.7 (H) 0.3 - 1.2 mg/dL   GFR calc non Af Amer >60 >60 mL/min   GFR calc Af Amer >60 >60 mL/min   Anion gap 8 5 - 15    Comment: Performed at [facility], [place] [address], [place], [place] [address]  Magnesium     Status: None   Collection Time: 11/02/19  5:58 AM  Result Value Ref Range   Magnesium 2.0 1.7 - 2.4 mg/dL    Comment: Performed at [facility], [place] [address], [place], [place] [address]  CBC     Status: None   Collection Time: 11/02/19  5:58 AM  Result Value Ref Range   WBC 6.4 4.0 - 10.5 K/uL   RBC 4.96 4.22 - 5.81 MIL/uL   Hemoglobin 16.3 13.0 - 17.0 g/dL   HCT 48.6 39.0 - 52.0 %   MCV 98.0 80.0 - 100.0 fL   MCH 32.9 26.0 - 34.0 pg   MCHC 33.5 30.0 - 36.0 g/dL   RDW 12.8 11.5 - 15.5 %   Platelets 208 150 - 400 K/uL   nRBC 0.0 0.0 - 0.2 %    Comment: Performed at [facility], [place] [address], [place], [place] [address]  Lipid panel     Status: None   Collection Time: 11/02/19  5:58 AM  Result Value Ref Range   Cholesterol 156 0 - 200 mg/dL   Triglycerides 65 <150 mg/dL   HDL 50 >40 mg/dL   Total CHOL/HDL Ratio 3.1 RATIO   VLDL 13 0 - 40 mg/dL   LDL Cholesterol 93 0 - 99 mg/dL    Comment:        Total Cholesterol/HDL:CHD Risk Coronary Heart  Disease Risk Table                     Men   Women  1/2 Average Risk   3.4   3.3  Average Risk       5.0   4.4  2 X Average Risk   9.6   7.1  3  X Average Risk  23.4   11.0        Use the calculated Patient Ratio above and the CHD Risk Table to determine the patient's CHD Risk.        ATP III CLASSIFICATION (LDL):  <100     mg/dL   Optimal  100-129  mg/dL   Near or Above                    Optimal  130-159  mg/dL   Borderline  160-189  mg/dL   High  >190     mg/dL   Very High Performed at [place] [address], [place], [place] [address]   Hemoglobin A1c     Status: Abnormal   Collection Time: 11/02/19  5:58 AM  Result Value Ref Range   Hgb A1c MFr Bld 6.8 (H) 4.8 - 5.6 %    Comment: (NOTE) Pre diabetes:          5.7%-6.4% Diabetes:              >6.4% Glycemic control for   <7.0% adults with diabetes    Mean Plasma Glucose 148.46 mg/dL    Comment: Performed at [place] [address], [place],  [address]  NM Myocar Multi W/Spect Tamela Oddi Motion / EF     Status: None   Collection Time: 11/02/19  1:43 PM  Result Value Ref Range   Rest HR 77 bpm   Rest BP 144/86 mmHg   Exercise duration (sec) 0 sec   Percent HR 71 %   Exercise duration (min) 0 min   Estimated workload 1.0 METS   Peak HR 112 bpm   Peak BP 157/87 mmHg   MPHR 156 bpm     HPI   Prior to Admission medications   Medication Sig Start Date End Date Taking? Authorizing Provider  albuterol (VENTOLIN HFA) 108 (90 Base) MCG/ACT inhaler Inhale 1-2 puffs into the lungs every 6 (six) hours as needed for wheezing or shortness of breath. 08/26/19  Yes Ustin Cruickshank, Ines [place], MD  aspirin EC 81 MG EC tablet Take 1 tablet (81 mg total) by mouth daily. 11/03/19  Yes Annita Brod, MD  atorvastatin (LIPITOR) 20 MG tablet Take 1 tablet (20 mg total) by mouth daily. 08/26/19  Yes Shahmeer Bunn, Ines [place], MD  budesonide-formoterol [facility]) 160-4.5 MCG/ACT inhaler Inhale 2 puffs  into the lungs 2 (two) times daily. 08/26/19  Yes Jenette Rayson, Ines Bloomer, MD  metFORMIN (GLUCOPHAGE) 500 MG tablet Take 1 tablet (500 mg total) by mouth 2 (two) times daily with a meal. 08/26/19  Yes Natsuko Kelsay, Ines [facility]er, MD  Multiple Vitamin (MULTIVITAMIN) capsule Take 1 capsule by mouth daily.   Yes [provider]  nicotine (NICODERM CQ - DOSED IN MG/24 HOURS) 14 mg/24hr patch Place 1 patch (14 mg total) onto the skin daily. 11/03/19  Yes Annita Brod, MD  OXYGEN Inhale 2 L into the lungs daily as needed (SOB).    Yes [provider]  glipiZIDE (GLUCOTROL) 5 MG tablet Take 1 tablet (5 mg total) by mouth daily with breakfast. 08/26/19 11/24/19  [name], MD    No Known Allergies  Patient Active Problem List   Diagnosis Date Noted  . Hyperlipidemia 11/02/2019  . History of MI (myocardial infarction) 11/02/2019  . Chest pain 11/01/2019  . Cigarette smoker 08/15/2018  . COPD ? GOLD III/ active smoker 08/14/2018  . History of diet-controlled diabetes 06/30/2018  .  COPD (chronic obstructive pulmonary disease) ([place]) 10/14/2014    Past Medical History:  Diagnosis Date  . Diabetes mellitus ([place]) 10/2014   pt denies being diabetic.   . Emphysema of lung ([place])   . Emphysema/COPD ([place]) 10/2014  . Hepatic steatosis   . Thrombocytopenia ([place]) 09/2015   platelets in 120s.     Past Surgical History:  Procedure Laterality Date  . ESOPHAGOGASTRODUODENOSCOPY N/A 09/23/2015   Procedure: ESOPHAGOGASTRODUODENOSCOPY (EGD);  Surgeon: Ladene Artist, MD;  Location: [facility];  Service: Endoscopy;  Laterality: N/A;  . FOOT SURGERY    . INGUINAL HERNIA REPAIR Left 05/15/2018   Procedure: LEFT INGUINAL HERNIA REPAIR WITH MESH;  Surgeon: Erroll Luna, MD;  Location: [facility];  Service: General;  Laterality: Left;  . INSERTION OF MESH Left 05/15/2018   Procedure: INSERTION OF MESH;  Surgeon: Erroll Luna, MD;  Location: [facility];  Service: General;  Laterality: Left;  .  UPPER GASTROINTESTINAL ENDOSCOPY      Social History   Socioeconomic History  . Marital status: Married    Spouse name: Ramonita Lab  . Number of children: 6  . Years of education: Not on file  . Highest education level: Not on file  Occupational History  . Occupation: Employed    Employer: [employer]  Tobacco Use  . Smoking status: Current Every Day Smoker    Packs/day: 0.15    Years: 47.00    Pack years: 7.05    Types: Cigarettes  . Smokeless tobacco: Never Used  Substance and Sexual Activity  . Alcohol use: Yes    Comment: occ  . Drug use: No  . Sexual activity: Never  Other Topics Concern  . Not on file  Social History Narrative   Lives with wife.  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   Social Determinants of Health   Financial Resource Strain:   . Difficulty of Paying Living Expenses:   Food Insecurity:   . Worried About Charity fundraiser in the Last Year:   . Arboriculturist in the Last Year:   Transportation Needs:   . Film/video editor (Medical):   Marland Kitchen Lack of Transportation (Non-Medical):   Physical Activity:   . Days of Exercise per Week:   . Minutes of Exercise per Session:   Stress:   . Feeling of Stress :   Social Connections:   . Frequency of Communication with Friends and Family:   . Frequency of Social Gatherings with Friends and Family:   . Attends Religious Services:   . Active Member of Clubs or Organizations:   . Attends Archivist Meetings:   Marland Kitchen Marital Status:   Intimate Partner Violence:   . Fear of Current or Ex-Partner:   . Emotionally Abused:   Marland Kitchen Physically Abused:   . Sexually Abused:     Family History  Problem Relation Age of Onset  . Emphysema Mother   . Diabetes Mother   . Emphysema Father   . Cancer Sister        Stomach cancer  . Diabetes Sister   . Stomach cancer Sister   . Diabetes Brother   . Colon cancer Neg Hx   . Colon polyps Neg Hx   . Esophageal cancer Neg Hx   . Rectal cancer  Neg Hx      Review of Systems  Constitutional: Negative.  Negative for chills and fever.  HENT: Negative.  Negative for congestion and sore throat.  Respiratory: Negative.  Negative for cough and shortness of breath.   Cardiovascular: Negative.  Negative for chest pain and palpitations.  Gastrointestinal: Negative.  Negative for abdominal pain, blood in stool, diarrhea, melena, nausea and vomiting.  Genitourinary: Negative.  Negative for dysuria and hematuria.  Musculoskeletal: Negative.  Negative for back pain, myalgias and neck pain.  Skin: Negative.  Negative for rash.  Neurological: Negative.  Negative for dizziness and headaches.  All other systems reviewed and are negative.  Today's Vitals   11/25/19 1311  BP: 133/80  Pulse: (!) 103  Temp: 98.5 F (36.9 C)  TempSrc: Temporal  SpO2: 94%  Weight: 160 lb 6.4 oz (72.8 kg)  Height: 5\' 9"  (1.753 m)   Body mass index is 23.69 kg/m.   Physical Exam Vitals reviewed.  Constitutional:      Appearance: Normal appearance.  HENT:     Head: Normocephalic.  Eyes:     Extraocular Movements: Extraocular movements intact.     Conjunctiva/sclera: Conjunctivae normal.     Pupils: Pupils are equal, round, and reactive to light.  Cardiovascular:     Rate and Rhythm: Normal rate and regular rhythm.     Pulses: Normal pulses.     Heart sounds: Normal heart sounds.  Pulmonary:     Effort: Pulmonary effort is normal.     Breath sounds: Normal breath sounds.  Abdominal:     General: Bowel sounds are normal. There is no distension.     Palpations: Abdomen is soft.     Tenderness: There is no abdominal tenderness.  Musculoskeletal:        General: Normal range of motion.     Cervical back: Normal range of motion and neck supple.  Skin:    General: Skin is warm and dry.     Capillary Refill: Capillary refill takes less than 2 seconds.  Neurological:     General: No focal deficit present.     Mental Status: He is alert and oriented  to person, place, and time.  Psychiatric:        Mood and Affect: Mood normal.        Behavior: Behavior normal.    Type 2 diabetes mellitus with hyperglycemia, without long-term current use of insulin (HCC) Much improved diabetes with hemoglobin A1c at 6.8.  Continue Metformin and glipizide.  Diet and nutrition discussed with patient. Follow-up in 6 months.    ASSESSMENT & PLAN: Mae was seen today for diabetes, copd and asthma.  Diagnoses and all orders for this visit:  Type 2 diabetes mellitus with hyperglycemia, without long-term current use of insulin (HCC)  Chronic obstructive pulmonary disease, unspecified COPD type (HCC) -     albuterol (PROVENTIL) (2.5 MG/3ML) 0.083% nebulizer solution; Take 3 mLs (2.5 mg total) by nebulization every 6 (six) hours as needed for wheezing or shortness of breath.    Patient Instructions       If you have lab work done today you will be contacted with your lab results within the next 2 weeks.  If you have not heard from [place] then please contact us. The fastest way to get your results is to register for My Chart.   IF you received an x-ray today, you will receive an invoice from [facility]. Please contact [facility] at [phone] with questions or concerns regarding your invoice.   IF you received labwork today, you will receive an invoice from [facility]. Please contact [facility] at [phone] with questions or concerns regarding your invoice.  Our billing staff will not be able to assist you with questions regarding bills from these companies.  You will be contacted with the lab results as soon as they are available. The fastest way to get your results is to activate your My Chart account. Instructions are located on the last page of this paperwork. If you have not heard from [place] regarding the results in 2 weeks, please contact this office.      Diabetes mellitus y nutricin, en adultos Diabetes Mellitus and  Nutrition, Adult Si sufre de diabetes (diabetes mellitus), es muy importante tener hbitos alimenticios saludables debido a que sus niveles de Designer, television/film set sangre (glucosa) se ven afectados en gran medida por lo que come y bebe. [place] alimentos saludables en las cantidades [place], aproximadamente a la [employer], [place] ayudar a:  Aeronautical engineer glucemia.  Disminuir el riesgo de sufrir una enfermedad cardaca.  Mejorar la presin arterial.  Science writer o mantener un peso saludable. Todas las personas que sufren de diabetes son diferentes y cada una tiene necesidades diferentes en cuanto a un plan de alimentacin. El mdico puede recomendarle que trabaje con un especialista en dietas y nutricin (nutricionista) para Financial trader plan para usted. Su plan de alimentacin puede variar segn factores como:  Las caloras que necesita.  Los medicamentos que toma.  Su peso.  Sus niveles de glucemia, presin arterial y colesterol.  Su nivel de [place].  Otras afecciones que tenga, como enfermedades cardacas o renales. Cmo me afectan los carbohidratos? Los carbohidratos, o hidratos de carbono, afectan su nivel de glucemia ms que cualquier otro tipo de alimento. La ingesta de carbohidratos naturalmente aumenta la cantidad de [employer]. El recuento de carbohidratos es un mtodo destinado a Catering manager un registro de la cantidad de carbohidratos que se consumen. El recuento de carbohidratos es importante para Theatre manager la glucemia a un nivel saludable, especialmente si utiliza insulina o toma determinados medicamentos por va oral para la diabetes. Es importante conocer la cantidad de carbohidratos que se pueden ingerir en cada comida sin correr Engineer, manufacturing. Esto es Psychologist, forensic. Su nutricionista puede ayudarlo a calcular la cantidad de carbohidratos que debe ingerir en cada comida y en cada refrigerio. Entre los alimentos que contienen carbohidratos, se  incluyen:  Pan, cereal, arroz, pastas y galletas.  Papas y maz.  Guisantes, frijoles y lentejas.  Leche y Estate agent.  Lambert Mody y [place].  Postres, como pasteles, galletas, helado y caramelos. Cmo me afecta el alcohol? El alcohol puede provocar disminuciones sbitas de la glucemia (hipoglucemia), especialmente si utiliza insulina o toma determinados medicamentos por va oral para la diabetes. La hipoglucemia es una afeccin potencialmente mortal. Los sntomas de la hipoglucemia (somnolencia, mareos y confusin) son similares a los sntomas de haber consumido demasiado alcohol. Si el mdico afirma que el alcohol es seguro para usted, [place] estas pautas:  Limite el consumo de alcohol a no ms de 32medida por da si es mujer y no est [place], y a 21medidas si es hombre. Una medida equivale a 12oz (345ml) de cerveza, 5oz (153ml) de vino o 1oz (81ml) de bebidas alcohlicas de alta graduacin.  No beba con el estmago vaco.  Mantngase hidratado bebiendo agua, refrescos dietticos o t helado sin azcar.  Tenga en cuenta que los refrescos comunes, los jugos y otras bebida para Optician, dispensing pueden contener mucha azcar y se deben contar como carbohidratos. Cules son algunos consejos para seguir este plan?  [place]  los alimentos  Comience por leer el tamao de la porcin en la "Informacin nutricional" en las etiquetas de los alimentos envasados y las bebidas. La cantidad de caloras, carbohidratos, grasas y otros nutrientes mencionados en la etiqueta se basan en una porcin del alimento. Muchos alimentos contienen ms de una porcin por envase.  Verifique la cantidad total de gramos (g) de carbohidratos totales en una porcin. Puede calcular la cantidad de porciones de carbohidratos al dividir el total de carbohidratos por 15. Por ejemplo, si un alimento tiene un total de 30g de carbohidratos, equivale a 2 porciones de carbohidratos.  Verifique la cantidad de gramos (g) de  grasas saturadas y grasas trans en una porcin. Escoja alimentos que no contengan grasa o que tengan un bajo contenido.  Verifique la cantidad de miligramos (mg) de sal (sodio) en una porcin. [place] [employer] de las personas deben limitar la ingesta de sodio total a menos de 2300mg  por Training and development officer.  Siempre consulte la informacin nutricional de los alimentos etiquetados como "con bajo contenido de grasa" o "sin grasa". Estos alimentos pueden tener un mayor contenido de Location manager agregada o carbohidratos refinados, y deben evitarse.  Hable con su nutricionista para identificar sus objetivos diarios en cuanto a los nutrientes mencionados en la etiqueta. Al ir de compras  Evite comprar alimentos procesados, enlatados o precocinados. Estos alimentos tienden a Special educational needs teacher mayor cantidad de [place], sodio y azcar agregada.  Compre en la zona exterior de la tienda de comestibles. Esta zona incluye frutas y verduras frescas, granos a granel, carnes frescas y productos lcteos frescos. Al cocinar  Utilice mtodos de coccin a baja temperatura, como hornear, en lugar de mtodos de coccin a alta temperatura, como frer en abundante aceite.  Cocine con aceites saludables, como el aceite de [place], canola o [place].  Evite cocinar con manteca, crema o carnes con alto contenido de grasa. Planificacin de las comidas  Coma las comidas y los refrigerios regularmente, preferentemente a la misma hora todos [place]. Evite pasar largos perodos de tiempo sin comer.  Consuma alimentos ricos en fibra, como frutas frescas, verduras, frijoles y cereales integrales. Consulte a su nutricionista sobre cuntas porciones de carbohidratos puede consumir en cada comida.  Consuma entre 4 y 6 onzas (oz) de protenas magras por da, como carnes [place], pollo, pescado, huevos o tofu. Una onza de protena magra equivale a: ? 1 onza de carne, pollo o pescado. ? 1huevo. ?  taza de tofu.  Coma algunos alimentos por da que contengan  grasas saludables, como aguacates, frutos secos, semillas y pescado. Estilo de vida  Controle su nivel de glucemia con regularidad.  Haga actividad fsica habitualmente como se lo haya indicado el mdico. Esto puede incluir lo siguiente: ? 143minutos semanales de ejercicio de intensidad moderada o alta. Esto podra incluir caminatas dinmicas, ciclismo o gimnasia acutica. ? Realizar ejercicios de elongacin y de fortalecimiento, como yoga o levantamiento de pesas, por lo menos 2veces por semana.  Tome los [employer] se lo haya indicado el mdico.  No consuma ningn producto que contenga nicotina o tabaco, como cigarrillos y Psychologist, sport and exercise. Si necesita ayuda para dejar de fumar, consulte al [employer] con un asesor o instructor en diabetes para identificar estrategias para controlar el estrs y cualquier desafo emocional y social. Preguntas para hacerle al mdico  Es necesario que consulte a Radio broadcast assistant en el cuidado de la diabetes?  Es necesario que me rena con un nutricionista?  A qu nmero puedo llamar si tengo preguntas?  Cules son los mejores momentos para controlar la glucemia? Dnde encontrar ms informacin:  Asociacin Estadounidense de la Diabetes (American Diabetes Association): diabetes.org  Academia de Nutricin y Information systems manager (Academy of Nutrition and Dietetics): www.eatright.[place] Diabetes y las Enfermedades Digestivas y Renales [facility] of Diabetes and Digestive and Kidney Diseases, NIH): [employer].dk Resumen  Un plan de alimentacin saludable lo ayudar a Aeronautical engineer glucemia y Theatre manager un estilo de vida saludable.  Trabajar con un especialista en dietas y nutricin (nutricionista) puede ayudarlo a Insurance claims handler de alimentacin para usted.  Tenga en cuenta que los carbohidratos (hidratos de carbono) y el alcohol tienen efectos inmediatos en sus niveles de glucemia. Es importante  contar los carbohidratos que ingiere y consumir alcohol con prudencia. Esta informacin no tiene Marine scientist el consejo del mdico. Asegrese de hacerle al mdico cualquier pregunta que tenga. Document Revised: 02/28/2017 Document Reviewed: 10/10/2016 Elsevier Patient Education  2020 Elsevier Inc.      Agustina Caroli, MD [facility]

## 2019-11-25 NOTE — Assessment & Plan Note (Signed)
Much improved diabetes with hemoglobin A1c at 6.8.  Continue Metformin and glipizide.  Diet and nutrition discussed with patient. Follow-up in 6 months.

## 2019-11-25 NOTE — Progress Notes (Signed)
° ° ° °  If you have lab work done today you will be contacted with your lab results within the next 2 weeks.  If you have not heard from us then please contact us. The fastest way to get your results is to register for My Chart. ° ° °IF you received an x-ray today, you will receive an invoice from [facility]. Please contact [facility] at [phone] with questions or concerns regarding your invoice.  ° °IF you received labwork today, you will receive an invoice from [facility]. Please contact [facility] at [phone] with questions or concerns regarding your invoice.  ° °Our billing staff will not be able to assist you with questions regarding bills from these companies. ° °You will be contacted with the lab results as soon as they are available. The fastest way to get your results is to activate your My Chart account. Instructions are located on the last page of this paperwork. If you have not heard from us regarding the results in 2 weeks, please contact this office. °  ° ° ° °

## 2019-11-25 NOTE — Patient Instructions (Addendum)
   If you have lab work done today you will be contacted with your lab results within the next 2 weeks.  If you have not heard from us then please contact us. The fastest way to get your results is to register for My Chart.   IF you received an x-ray today, you will receive an invoice from [facility]. Please contact [facility] at [phone] with questions or concerns regarding your invoice.   IF you received labwork today, you will receive an invoice from [facility]. Please contact [facility] at [phone] with questions or concerns regarding your invoice.   Our billing staff will not be able to assist you with questions regarding bills from these companies.  You will be contacted with the lab results as soon as they are available. The fastest way to get your results is to activate your My Chart account. Instructions are located on the last page of this paperwork. If you have not heard from us regarding the results in 2 weeks, please contact this office.     Diabetes mellitus y nutricin, en adultos Diabetes Mellitus and Nutrition, Adult Si sufre de diabetes (diabetes mellitus), es muy importante tener hbitos alimenticios saludables debido a que sus niveles de azcar en la sangre (glucosa) se ven afectados en gran medida por lo que come y bebe. [place] alimentos saludables en las cantidades adecuadas, aproximadamente a la misma hora todos los das, lo ayudar a:  Controlar la glucemia.  Disminuir el riesgo de sufrir una enfermedad cardaca.  Mejorar la presin arterial.  Alcanzar o mantener un peso saludable. Todas las personas que sufren de diabetes son diferentes y cada una tiene necesidades diferentes en cuanto a un plan de alimentacin. El mdico puede recomendarle que trabaje con un especialista en dietas y nutricin (nutricionista) para elaborar el mejor plan para usted. Su plan de alimentacin puede variar segn factores como:  Las caloras que  necesita.  Los medicamentos que toma.  Su peso.  Sus niveles de glucemia, presin arterial y colesterol.  Su nivel de actividad.  Otras afecciones que tenga, como enfermedades cardacas o renales. Cmo me afectan los carbohidratos? Los carbohidratos, o hidratos de carbono, afectan su nivel de glucemia ms que cualquier otro tipo de alimento. La ingesta de carbohidratos naturalmente aumenta la cantidad de glucosa en la sangre. El recuento de carbohidratos es un mtodo destinado a llevar un registro de la cantidad de carbohidratos que se consumen. El recuento de carbohidratos es importante para mantener la glucemia a un nivel saludable, especialmente si utiliza insulina o toma determinados medicamentos por va oral para la diabetes. Es importante conocer la cantidad de carbohidratos que se pueden ingerir en cada comida sin correr ningn riesgo. Esto es diferente en cada persona. Su nutricionista puede ayudarlo a calcular la cantidad de carbohidratos que debe ingerir en cada comida y en cada refrigerio. Entre los alimentos que contienen carbohidratos, se incluyen:  Pan, cereal, arroz, pastas y galletas.  Papas y maz.  Guisantes, frijoles y lentejas.  [place] y yogur.  Frutas y jugo.  Postres, como pasteles, galletas, helado y caramelos. Cmo me afecta el alcohol? El alcohol puede provocar disminuciones sbitas de la glucemia (hipoglucemia), especialmente si utiliza insulina o toma determinados medicamentos por va oral para la diabetes. La hipoglucemia es una afeccin potencialmente mortal. Los sntomas de la hipoglucemia (somnolencia, mareos y confusin) son similares a los sntomas de haber consumido demasiado alcohol. Si el mdico afirma que el alcohol es seguro para usted, siga estas pautas:    Limite el consumo de alcohol a no ms de 1medida por da si es mujer y no est embarazada, y a 2medidas si es hombre. Una medida equivale a 12oz (355ml) de cerveza, 5oz (148ml) de vino o  1oz (44ml) de bebidas alcohlicas de alta graduacin.  No beba con el estmago vaco.  Mantngase hidratado bebiendo agua, refrescos dietticos o t helado sin azcar.  Tenga en cuenta que los refrescos comunes, los jugos y otras bebida para mezclar pueden contener mucha azcar y se deben contar como carbohidratos. Cules son algunos consejos para seguir este plan?  Leer las etiquetas de los alimentos  Comience por leer el tamao de la porcin en la "Informacin nutricional" en las etiquetas de los alimentos envasados y las bebidas. La cantidad de caloras, carbohidratos, grasas y otros nutrientes mencionados en la etiqueta se basan en una porcin del alimento. Muchos alimentos contienen ms de una porcin por envase.  Verifique la cantidad total de gramos (g) de carbohidratos totales en una porcin. Puede calcular la cantidad de porciones de carbohidratos al dividir el total de carbohidratos por 15. Por ejemplo, si un alimento tiene un total de 30g de carbohidratos, equivale a 2 porciones de carbohidratos.  Verifique la cantidad de gramos (g) de grasas saturadas y grasas trans en una porcin. Escoja alimentos que no contengan grasa o que tengan un bajo contenido.  Verifique la cantidad de miligramos (mg) de sal (sodio) en una porcin. La [place] de las personas deben limitar la ingesta de sodio total a menos de 2300mg por da.  Siempre consulte la informacin nutricional de los alimentos etiquetados como "con bajo contenido de grasa" o "sin grasa". Estos alimentos pueden tener un mayor contenido de azcar agregada o carbohidratos refinados, y deben evitarse.  Hable con su nutricionista para identificar sus objetivos diarios en cuanto a los nutrientes mencionados en la etiqueta. Al ir de compras  Evite comprar alimentos procesados, enlatados o precocinados. Estos alimentos tienden a tener una mayor cantidad de grasa, sodio y azcar agregada.  Compre en la zona exterior de la tienda de  comestibles. Esta zona incluye frutas y verduras frescas, granos a granel, carnes frescas y productos lcteos frescos. Al cocinar  Utilice mtodos de coccin a baja temperatura, como hornear, en lugar de mtodos de coccin a alta temperatura, como frer en abundante aceite.  Cocine con aceites saludables, como el aceite de oliva, canola o girasol.  Evite cocinar con manteca, crema o carnes con alto contenido de grasa. Planificacin de las comidas  Coma las comidas y los refrigerios regularmente, preferentemente a la misma hora todos los das. Evite pasar largos perodos de tiempo sin comer.  Consuma alimentos ricos en fibra, como frutas frescas, verduras, frijoles y cereales integrales. Consulte a su nutricionista sobre cuntas porciones de carbohidratos puede consumir en cada comida.  Consuma entre 4 y 6 onzas (oz) de protenas magras por da, como carnes magras, pollo, pescado, huevos o tofu. Una onza de protena magra equivale a: ? 1 onza de carne, pollo o pescado. ? 1huevo. ?  taza de tofu.  Coma algunos alimentos por da que contengan grasas saludables, como aguacates, frutos secos, semillas y pescado. Estilo de vida  Controle su nivel de glucemia con regularidad.  Haga actividad fsica habitualmente como se lo haya indicado el mdico. Esto puede incluir lo siguiente: ? 150minutos semanales de ejercicio de intensidad moderada o alta. Esto podra incluir caminatas dinmicas, ciclismo o gimnasia acutica. ? Realizar ejercicios de elongacin y de fortalecimiento, como yoga o levantamiento   de pesas, por lo menos 2veces por semana.  Tome los medicamentos como se lo haya indicado el mdico.  No consuma ningn producto que contenga nicotina o tabaco, como cigarrillos y cigarrillos electrnicos. Si necesita ayuda para dejar de fumar, consulte al mdico.  Trabaje con un asesor o instructor en diabetes para identificar estrategias para controlar el estrs y cualquier desafo emocional  y social. Preguntas para hacerle al mdico  Es necesario que consulte a un instructor en el cuidado de la diabetes?  Es necesario que me rena con un nutricionista?  A qu nmero puedo llamar si tengo preguntas?  Cules son los mejores momentos para controlar la glucemia? Dnde encontrar ms informacin:  Asociacin Estadounidense de la Diabetes (American Diabetes Association): diabetes.org  Academia de Nutricin y Diettica (Academy of Nutrition and Dietetics): www.eatright.org  Instituto Nacional de la Diabetes y las Enfermedades Digestivas y Renales (National Institute of Diabetes and Digestive and Kidney Diseases, NIH): www.niddk.nih.gov Resumen  Un plan de alimentacin saludable lo ayudar a controlar la glucemia y mantener un estilo de vida saludable.  Trabajar con un especialista en dietas y nutricin (nutricionista) puede ayudarlo a elaborar el mejor plan de alimentacin para usted.  Tenga en cuenta que los carbohidratos (hidratos de carbono) y el alcohol tienen efectos inmediatos en sus niveles de glucemia. Es importante contar los carbohidratos que ingiere y consumir alcohol con prudencia. Esta informacin no tiene como fin reemplazar el consejo del mdico. Asegrese de hacerle al mdico cualquier pregunta que tenga. Document Revised: 02/28/2017 Document Reviewed: 10/10/2016 Elsevier Patient Education  2020 Elsevier Inc.  

## 2020-01-22 ENCOUNTER — Emergency Department (HOSPITAL_COMMUNITY): Payer: Self-pay

## 2020-01-22 ENCOUNTER — Encounter (HOSPITAL_COMMUNITY): Payer: Self-pay | Admitting: Emergency Medicine

## 2020-01-22 ENCOUNTER — Emergency Department (HOSPITAL_COMMUNITY)
Admission: EM | Admit: 2020-01-22 | Discharge: 2020-01-22 | Disposition: A | Discharge status: Home / Self Care | Payer: Self-pay | Attending: Emergency Medicine | Admitting: Emergency Medicine

## 2020-01-22 DIAGNOSIS — E119 Type 2 diabetes mellitus without complications: Secondary | ICD-10-CM | POA: Insufficient documentation

## 2020-01-22 DIAGNOSIS — F17210 Nicotine dependence, cigarettes, uncomplicated: Secondary | ICD-10-CM | POA: Insufficient documentation

## 2020-01-22 DIAGNOSIS — J209 Acute bronchitis, unspecified: Secondary | ICD-10-CM

## 2020-01-22 DIAGNOSIS — Z20822 Contact with and (suspected) exposure to covid-19: Secondary | ICD-10-CM | POA: Insufficient documentation

## 2020-01-22 DIAGNOSIS — Z7984 Long term (current) use of oral hypoglycemic drugs: Secondary | ICD-10-CM | POA: Insufficient documentation

## 2020-01-22 DIAGNOSIS — Z7982 Long term (current) use of aspirin: Secondary | ICD-10-CM | POA: Insufficient documentation

## 2020-01-22 DIAGNOSIS — R0602 Shortness of breath: Secondary | ICD-10-CM | POA: Insufficient documentation

## 2020-01-22 DIAGNOSIS — J449 Chronic obstructive pulmonary disease, unspecified: Secondary | ICD-10-CM | POA: Insufficient documentation

## 2020-01-22 LAB — SARS CORONAVIRUS 2 BY RT PCR (HOSPITAL ORDER, PERFORMED IN ~~LOC~~ HOSPITAL LAB): SARS Coronavirus 2: NEGATIVE

## 2020-01-22 LAB — CBC
HCT: 43.3 % (ref 39.0–52.0)
Hemoglobin: 14.9 g/dL (ref 13.0–17.0)
MCH: 32.3 pg (ref 26.0–34.0)
MCHC: 34.4 g/dL (ref 30.0–36.0)
MCV: 93.9 fL (ref 80.0–100.0)
Platelets: 201 10*3/uL (ref 150–400)
RBC: 4.61 MIL/uL (ref 4.22–5.81)
RDW: 12.6 % (ref 11.5–15.5)
WBC: 12.2 10*3/uL — ABNORMAL HIGH (ref 4.0–10.5)
nRBC: 0 % (ref 0.0–0.2)

## 2020-01-22 LAB — BASIC METABOLIC PANEL
Anion gap: 12 (ref 5–15)
BUN: 15 mg/dL (ref 8–23)
CO2: 25 mmol/L (ref 22–32)
Calcium: 9.3 mg/dL (ref 8.9–10.3)
Chloride: 102 mmol/L (ref 98–111)
Creatinine, Ser: 0.89 mg/dL (ref 0.61–1.24)
GFR calc Af Amer: >60 mL/min (ref >60)
GFR calc non Af Amer: >60 mL/min (ref >60)
Glucose, Bld: 266 mg/dL — ABNORMAL HIGH (ref 70–99)
Potassium: 3.9 mmol/L (ref 3.5–5.1)
Sodium: 139 mmol/L (ref 135–145)

## 2020-01-22 LAB — TROPONIN I (HIGH SENSITIVITY)
Troponin I (High Sensitivity): 4 ng/L (ref <18)
Troponin I (High Sensitivity): 4 ng/L (ref <18)

## 2020-01-22 LAB — BRAIN NATRIURETIC PEPTIDE: B Natriuretic Peptide: 42.4 pg/mL (ref 0.0–100.0)

## 2020-01-22 LAB — D-DIMER, QUANTITATIVE: D-Dimer, Quant: 0.63 ug/mL-FEU — ABNORMAL HIGH (ref 0.00–0.50)

## 2020-01-22 MED ORDER — MORPHINE SULFATE (PF) 4 MG/ML IV SOLN
4.0000 mg | Freq: Once | INTRAVENOUS | Status: AC
  Administered 2020-01-22: 4 mg via INTRAVENOUS
  Filled 2020-01-22: qty 1

## 2020-01-22 MED ORDER — ALBUTEROL SULFATE HFA 108 (90 BASE) MCG/ACT IN AERS
4.0000 | INHALATION_SPRAY | Freq: Once | RESPIRATORY_TRACT | Status: AC
  Administered 2020-01-22: 4 via RESPIRATORY_TRACT
  Filled 2020-01-22: qty 6.7

## 2020-01-22 MED ORDER — SODIUM CHLORIDE (PF) 0.9 % IJ SOLN
INTRAMUSCULAR | Status: AC
  Filled 2020-01-22: qty 50

## 2020-01-22 MED ORDER — PREDNISONE 10 MG PO TABS
20.0000 mg | ORAL_TABLET | Freq: Two times a day (BID) | ORAL | 0 refills | Status: DC
Start: 2020-01-22 — End: 2020-01-26

## 2020-01-22 MED ORDER — IOHEXOL 350 MG/ML SOLN
100.0000 mL | Freq: Once | INTRAVENOUS | Status: AC | PRN
  Administered 2020-01-22: 65 mL via INTRAVENOUS

## 2020-01-22 MED ORDER — AZITHROMYCIN 250 MG PO TABS
ORAL_TABLET | ORAL | 0 refills | Status: DC
Start: 2020-01-22 — End: 2020-01-26

## 2020-01-22 MED ORDER — KETOROLAC TROMETHAMINE 30 MG/ML IJ SOLN
30.0000 mg | Freq: Once | INTRAMUSCULAR | Status: AC
  Administered 2020-01-22: 30 mg via INTRAVENOUS
  Filled 2020-01-22: qty 1

## 2020-01-22 NOTE — Discharge Instructions (Signed)
Begin taking Zithromax and prednisone as prescribed.  Use your albuterol inhaler, 2 puffs every 4 hours as needed for wheezing.  Return to the emergency department if symptoms significantly worsen or change.

## 2020-01-22 NOTE — ED Triage Notes (Signed)
Per pt, states CP, tightness, sharp since this weekend-states history of COPD and emphysema is diaphoretic, difficulty breathing

## 2020-01-22 NOTE — ED Provider Notes (Signed)
[facility] DEPT Provider Note   CSN: [id number] Arrival date & time: 01/22/20  1017     History Chief Complaint  Patient presents with  . Chest Pain  . Shortness of Breath    Duane Cuevas is a 64 y.o. male.  Patient is a 64 year old male with past medical history of diabetes, COPD, and recent admission with negative cardiac work-up. He presents today for evaluation of shortness of breath and tightness in his chest. This has been ongoing for the past 4 to 5 days. He describes a tightness to the left of his sternum that has been present since that time. He denies fevers or chills. He does report having some cough. He denies any swelling or pain in his legs.    The history is provided by the patient.  Chest Pain Pain location:  Substernal area Pain quality: tightness   Pain radiates to:  Does not radiate Pain severity:  Moderate Onset quality:  Gradual Duration:  5 days Timing:  Constant Progression:  Worsening Chronicity:  New Relieved by:  Nothing Worsened by:  Coughing Ineffective treatments:  None tried Associated symptoms: shortness of breath   Shortness of Breath Associated symptoms: chest pain        Past Medical History:  Diagnosis Date  . Diabetes mellitus ([place]) 10/2014   pt denies being diabetic.   . Emphysema of lung ([place])   . Emphysema/COPD ([place]) 10/2014  . Hepatic steatosis   . Thrombocytopenia ([place]) 09/2015   platelets in 120s.     Patient Active Problem List   Diagnosis Date Noted  . Hyperlipidemia 11/02/2019  . History of MI (myocardial infarction) 11/02/2019  . Chest pain 11/01/2019  . Cigarette smoker 08/15/2018  . COPD ? GOLD III/ active smoker 08/14/2018  . History of diet-controlled diabetes 06/30/2018  . COPD (chronic obstructive pulmonary disease) ([place]) 10/14/2014  . Type 2 diabetes mellitus with hyperglycemia, without long-term current use of insulin ([place]) 10/14/2014    Past Surgical History:    Procedure Laterality Date  . ESOPHAGOGASTRODUODENOSCOPY N/A 09/23/2015   Procedure: ESOPHAGOGASTRODUODENOSCOPY (EGD);  Surgeon: Ladene Artist, MD;  Location: [facility];  Service: Endoscopy;  Laterality: N/A;  . FOOT SURGERY    . INGUINAL HERNIA REPAIR Left 05/15/2018   Procedure: LEFT INGUINAL HERNIA REPAIR WITH MESH;  Surgeon: Erroll Luna, MD;  Location: [facility];  Service: General;  Laterality: Left;  . INSERTION OF MESH Left 05/15/2018   Procedure: INSERTION OF MESH;  Surgeon: Erroll Luna, MD;  Location: [facility];  Service: General;  Laterality: Left;  . UPPER GASTROINTESTINAL ENDOSCOPY         Family History  Problem Relation Age of Onset  . Emphysema Mother   . Diabetes Mother   . Emphysema Father   . Cancer Sister        Stomach cancer  . Diabetes Sister   . Stomach cancer Sister   . Diabetes Brother   . Colon cancer Neg Hx   . Colon polyps Neg Hx   . Esophageal cancer Neg Hx   . Rectal cancer Neg Hx     Social History   Tobacco Use  . Smoking status: Current Every Day Smoker    Packs/day: 0.15    Years: 47.00    Pack years: 7.05    Types: Cigarettes  . Smokeless tobacco: Never Used  Vaping Use  . Vaping Use: Former  Substance Use Topics  . Alcohol use: Yes    Comment: occ  .  Drug use: No    Home Medications Prior to Admission medications   Medication Sig Start Date End Date Taking? Authorizing Provider  albuterol (PROVENTIL) (2.5 MG/3ML) 0.083% nebulizer solution Take 3 mLs (2.5 mg total) by nebulization every 6 (six) hours as needed for wheezing or shortness of breath. 11/25/19   [name], MD  aspirin EC 81 MG EC tablet Take 1 tablet (81 mg total) by mouth daily. 11/03/19   Annita Brod, MD  atorvastatin (LIPITOR) 20 MG tablet Take 1 tablet (20 mg total) by mouth daily. 08/26/19   [name], MD  budesonide-formoterol [facility]) 160-4.5 MCG/ACT inhaler Inhale 2 puffs into the lungs 2 (two) times daily. 08/26/19   [name], MD  glipiZIDE (GLUCOTROL) 5 MG tablet Take 1 tablet (5 mg total) by mouth daily with breakfast. 08/26/19 11/24/19  [name], MD  metFORMIN (GLUCOPHAGE) 500 MG tablet Take 1 tablet (500 mg total) by mouth 2 (two) times daily with a meal. 08/26/19   Sagardia, Ines Bloomer, MD  Multiple Vitamin (MULTIVITAMIN) capsule Take 1 capsule by mouth daily.    [provider]  nicotine (NICODERM CQ - DOSED IN MG/24 HOURS) 14 mg/24hr patch Place 1 patch (14 mg total) onto the skin daily. 11/03/19   Annita Brod, MD  OXYGEN Inhale 2 L into the lungs daily as needed (SOB).     [provider]    Allergies    Patient has no known allergies.  Review of Systems   Review of Systems  Respiratory: Positive for shortness of breath.   Cardiovascular: Positive for chest pain.  All other systems reviewed and are negative.   Physical Exam Updated Vital Signs BP (!) 151/68 (BP Location: Left Arm)   Pulse (!) 125   Temp 97.8 F (36.6 C) (Oral)   Resp 18   SpO2 92%   Physical Exam Vitals and nursing note reviewed.  Constitutional:      General: He is not in acute distress.    Appearance: He is well-developed. He is not diaphoretic.  HENT:     Head: Normocephalic and atraumatic.  Cardiovascular:     Rate and Rhythm: Normal rate and regular rhythm.     Heart sounds: No murmur heard.  No friction rub.  Pulmonary:     Effort: Pulmonary effort is normal. No respiratory distress.     Breath sounds: Normal breath sounds. No wheezing or rales.  Abdominal:     General: Bowel sounds are normal. There is no distension.     Palpations: Abdomen is soft.     Tenderness: There is no abdominal tenderness.  Musculoskeletal:        General: Normal range of motion.     Cervical back: Normal range of motion and neck supple.     Right lower leg: No tenderness. No edema.     Left lower leg: No tenderness. No edema.  Skin:    General: Skin is warm and dry.  Neurological:      Mental Status: He is alert and oriented to person, place, and time.     Coordination: Coordination normal.     ED Results / Procedures / Treatments   Labs (all labs ordered are listed, but only abnormal results are displayed) Labs Reviewed  SARS CORONAVIRUS 2 BY RT PCR ([facility])  BASIC METABOLIC PANEL  CBC  D-DIMER, QUANTITATIVE (NOT AT [facility])  BRAIN NATRIURETIC PEPTIDE  TROPONIN I (HIGH  SENSITIVITY)    EKG ED ECG REPORT   Date: 01/22/2020  Rate: 105  Rhythm: sinus tachycardia  QRS Axis: normal  Intervals: normal  ST/T Wave abnormalities: normal  Conduction Disutrbances:none  Narrative Interpretation:   Old EKG Reviewed: none available  I have personally reviewed the EKG tracing and agree with the computerized printout as noted.   Radiology No results found.  Procedures Procedures (including critical care time)  Medications Ordered in ED Medications - No data to display  ED Course  I have reviewed the triage vital signs and the nursing notes.  Pertinent labs & imaging results that were available during my care of the patient were reviewed by me and considered in my medical decision making (see chart for details).    MDM Rules/Calculators/A&P  Patient is a 64 year old male presenting with complaints of chest discomfort and shortness of breath.  This has worsened over the past several days.  He recently was admitted at [facility] and had stress test showing old MI, but nothing acute.  He was discharged to home.  Patient is a prior cigarette smoker, and likely has some underlying bronchitis/COPD and this appears to be the case on today's CT scan.  There is no evidence for blood clot..  Patient given albuterol with some improvement.  To be treated for COPD with steroids, antibiotics, and albuterol.  Final Clinical Impression(s) / ED Diagnoses Final diagnoses:  None    Rx / DC Orders ED Discharge Orders    None         Veryl Speak, MD 01/22/20 1531

## 2020-01-23 ENCOUNTER — Emergency Department (HOSPITAL_COMMUNITY): Payer: Self-pay

## 2020-01-23 ENCOUNTER — Other Ambulatory Visit: Payer: Self-pay

## 2020-01-23 ENCOUNTER — Inpatient Hospital Stay (HOSPITAL_COMMUNITY)
Admission: EM | Admit: 2020-01-23 | Discharge: 2020-01-26 | DRG: 190 | Disposition: A | Discharge status: Home / Self Care | Payer: Self-pay | Attending: Family Medicine | Admitting: Family Medicine

## 2020-01-23 ENCOUNTER — Encounter (HOSPITAL_COMMUNITY): Payer: Self-pay | Admitting: Emergency Medicine

## 2020-01-23 DIAGNOSIS — E1169 Type 2 diabetes mellitus with other specified complication: Secondary | ICD-10-CM | POA: Diagnosis present

## 2020-01-23 DIAGNOSIS — J9601 Acute respiratory failure with hypoxia: Secondary | ICD-10-CM | POA: Diagnosis present

## 2020-01-23 DIAGNOSIS — E785 Hyperlipidemia, unspecified: Secondary | ICD-10-CM | POA: Diagnosis present

## 2020-01-23 DIAGNOSIS — Z825 Family history of asthma and other chronic lower respiratory diseases: Secondary | ICD-10-CM

## 2020-01-23 DIAGNOSIS — Z79899 Other long term (current) drug therapy: Secondary | ICD-10-CM

## 2020-01-23 DIAGNOSIS — Z7984 Long term (current) use of oral hypoglycemic drugs: Secondary | ICD-10-CM

## 2020-01-23 DIAGNOSIS — I252 Old myocardial infarction: Secondary | ICD-10-CM

## 2020-01-23 DIAGNOSIS — J439 Emphysema, unspecified: Principal | ICD-10-CM | POA: Diagnosis present

## 2020-01-23 DIAGNOSIS — Z800 Family history of malignant neoplasm of digestive organs: Secondary | ICD-10-CM

## 2020-01-23 DIAGNOSIS — Z20822 Contact with and (suspected) exposure to covid-19: Secondary | ICD-10-CM | POA: Diagnosis present

## 2020-01-23 DIAGNOSIS — I2510 Atherosclerotic heart disease of native coronary artery without angina pectoris: Secondary | ICD-10-CM | POA: Diagnosis present

## 2020-01-23 DIAGNOSIS — F17210 Nicotine dependence, cigarettes, uncomplicated: Secondary | ICD-10-CM | POA: Diagnosis present

## 2020-01-23 DIAGNOSIS — J449 Chronic obstructive pulmonary disease, unspecified: Secondary | ICD-10-CM | POA: Diagnosis present

## 2020-01-23 DIAGNOSIS — E119 Type 2 diabetes mellitus without complications: Secondary | ICD-10-CM | POA: Diagnosis present

## 2020-01-23 DIAGNOSIS — Z7982 Long term (current) use of aspirin: Secondary | ICD-10-CM

## 2020-01-23 DIAGNOSIS — Z833 Family history of diabetes mellitus: Secondary | ICD-10-CM

## 2020-01-23 DIAGNOSIS — K760 Fatty (change of) liver, not elsewhere classified: Secondary | ICD-10-CM | POA: Diagnosis present

## 2020-01-23 DIAGNOSIS — J40 Bronchitis, not specified as acute or chronic: Secondary | ICD-10-CM | POA: Diagnosis present

## 2020-01-23 DIAGNOSIS — E1165 Type 2 diabetes mellitus with hyperglycemia: Secondary | ICD-10-CM | POA: Diagnosis not present

## 2020-01-23 DIAGNOSIS — T380X5A Adverse effect of glucocorticoids and synthetic analogues, initial encounter: Secondary | ICD-10-CM | POA: Diagnosis not present

## 2020-01-23 DIAGNOSIS — J441 Chronic obstructive pulmonary disease with (acute) exacerbation: Secondary | ICD-10-CM | POA: Diagnosis present

## 2020-01-23 LAB — CBC WITH DIFFERENTIAL/PLATELET
Abs Immature Granulocytes: 0.05 10*3/uL (ref 0.00–0.07)
Abs Immature Granulocytes: 0.05 10*3/uL (ref 0.00–0.07)
Basophils Absolute: 0 10*3/uL (ref 0.0–0.1)
Basophils Absolute: 0 10*3/uL (ref 0.0–0.1)
Basophils Relative: 0 %
Basophils Relative: 0 %
Eosinophils Absolute: 0 10*3/uL (ref 0.0–0.5)
Eosinophils Absolute: 0 10*3/uL (ref 0.0–0.5)
Eosinophils Relative: 0 %
Eosinophils Relative: 0 %
HCT: 41.6 % (ref 39.0–52.0)
HCT: 43 % (ref 39.0–52.0)
Hemoglobin: 13.9 g/dL (ref 13.0–17.0)
Hemoglobin: 14.4 g/dL (ref 13.0–17.0)
Immature Granulocytes: 1 %
Immature Granulocytes: 1 %
Lymphocytes Relative: 3 %
Lymphocytes Relative: 2 %
Lymphs Abs: 0.3 10*3/uL — ABNORMAL LOW (ref 0.7–4.0)
Lymphs Abs: 0.2 10*3/uL — ABNORMAL LOW (ref 0.7–4.0)
MCH: 32.3 pg (ref 26.0–34.0)
MCH: 32.4 pg (ref 26.0–34.0)
MCHC: 33.4 g/dL (ref 30.0–36.0)
MCHC: 33.5 g/dL (ref 30.0–36.0)
MCV: 96.7 fL (ref 80.0–100.0)
MCV: 96.6 fL (ref 80.0–100.0)
Monocytes Absolute: 0.1 10*3/uL (ref 0.1–1.0)
Monocytes Absolute: 0 10*3/uL — ABNORMAL LOW (ref 0.1–1.0)
Monocytes Relative: 1 %
Monocytes Relative: 0 %
Neutro Abs: 9.1 10*3/uL — ABNORMAL HIGH (ref 1.7–7.7)
Neutro Abs: 8.7 10*3/uL — ABNORMAL HIGH (ref 1.7–7.7)
Neutrophils Relative %: 95 %
Neutrophils Relative %: 97 %
Platelets: 200 10*3/uL (ref 150–400)
Platelets: 210 10*3/uL (ref 150–400)
RBC: 4.3 MIL/uL (ref 4.22–5.81)
RBC: 4.45 MIL/uL (ref 4.22–5.81)
RDW: 13 % (ref 11.5–15.5)
RDW: 13 % (ref 11.5–15.5)
WBC: 9.6 10*3/uL (ref 4.0–10.5)
WBC: 8.9 10*3/uL (ref 4.0–10.5)
nRBC: 0 % (ref 0.0–0.2)
nRBC: 0 % (ref 0.0–0.2)

## 2020-01-23 LAB — COMPREHENSIVE METABOLIC PANEL
ALT: 20 U/L (ref 0–44)
AST: 17 U/L (ref 15–41)
Albumin: 4 g/dL (ref 3.5–5.0)
Alkaline Phosphatase: 67 U/L (ref 38–126)
Anion gap: 15 (ref 5–15)
BUN: 22 mg/dL (ref 8–23)
CO2: 20 mmol/L — ABNORMAL LOW (ref 22–32)
Calcium: 9.5 mg/dL (ref 8.9–10.3)
Chloride: 104 mmol/L (ref 98–111)
Creatinine, Ser: 1.06 mg/dL (ref 0.61–1.24)
GFR calc Af Amer: >60 mL/min (ref >60)
GFR calc non Af Amer: >60 mL/min (ref >60)
Glucose, Bld: 324 mg/dL — ABNORMAL HIGH (ref 70–99)
Potassium: 4 mmol/L (ref 3.5–5.1)
Sodium: 139 mmol/L (ref 135–145)
Total Bilirubin: 0.7 mg/dL (ref 0.3–1.2)
Total Protein: 7.5 g/dL (ref 6.5–8.1)

## 2020-01-23 LAB — I-STAT CHEM 8, ED
BUN: 28 mg/dL — ABNORMAL HIGH (ref 8–23)
Calcium, Ion: 1.11 mmol/L — ABNORMAL LOW (ref 1.15–1.40)
Chloride: 104 mmol/L (ref 98–111)
Creatinine, Ser: 0.8 mg/dL (ref 0.61–1.24)
Glucose, Bld: 356 mg/dL — ABNORMAL HIGH (ref 70–99)
HCT: 42 % (ref 39.0–52.0)
Hemoglobin: 14.3 g/dL (ref 13.0–17.0)
Potassium: 5.1 mmol/L (ref 3.5–5.1)
Sodium: 137 mmol/L (ref 135–145)
TCO2: 23 mmol/L (ref 22–32)

## 2020-01-23 LAB — CBG MONITORING, ED
Glucose-Capillary: 365 mg/dL — ABNORMAL HIGH (ref 70–99)
Glucose-Capillary: 327 mg/dL — ABNORMAL HIGH (ref 70–99)
Glucose-Capillary: 324 mg/dL — ABNORMAL HIGH (ref 70–99)
Glucose-Capillary: 236 mg/dL — ABNORMAL HIGH (ref 70–99)
Glucose-Capillary: 213 mg/dL — ABNORMAL HIGH (ref 70–99)

## 2020-01-23 LAB — TROPONIN I (HIGH SENSITIVITY)
Troponin I (High Sensitivity): 4 ng/L (ref <18)
Troponin I (High Sensitivity): 3 ng/L (ref <18)

## 2020-01-23 LAB — SARS CORONAVIRUS 2 BY RT PCR (HOSPITAL ORDER, PERFORMED IN ~~LOC~~ HOSPITAL LAB): SARS Coronavirus 2: NEGATIVE

## 2020-01-23 LAB — PROCALCITONIN: Procalcitonin: <0.1 ng/mL

## 2020-01-23 MED ORDER — INSULIN GLARGINE 100 UNIT/ML ~~LOC~~ SOLN
10.0000 [IU] | Freq: Every day | SUBCUTANEOUS | Status: DC
  Administered 2020-01-23 – 2020-01-26 (×4): 10 [IU] via SUBCUTANEOUS
  Filled 2020-01-23 (×4): qty 0.1

## 2020-01-23 MED ORDER — ASPIRIN EC 81 MG PO TBEC
81.0000 mg | DELAYED_RELEASE_TABLET | Freq: Every day | ORAL | Status: DC
  Administered 2020-01-23 – 2020-01-26 (×4): 81 mg via ORAL
  Filled 2020-01-23 (×4): qty 1

## 2020-01-23 MED ORDER — METHYLPREDNISOLONE SODIUM SUCC 40 MG IJ SOLR
40.0000 mg | Freq: Four times a day (QID) | INTRAMUSCULAR | Status: DC
  Filled 2020-01-23: qty 1

## 2020-01-23 MED ORDER — ALBUTEROL SULFATE HFA 108 (90 BASE) MCG/ACT IN AERS
8.0000 | INHALATION_SPRAY | Freq: Once | RESPIRATORY_TRACT | Status: AC
  Administered 2020-01-23: 8 via RESPIRATORY_TRACT
  Filled 2020-01-23: qty 6.7

## 2020-01-23 MED ORDER — HYDROCOD POLST-CPM POLST ER 10-8 MG/5ML PO SUER: 5.0000 mL | Freq: Two times a day (BID) | ORAL | Status: DC | PRN

## 2020-01-23 MED ORDER — INSULIN ASPART 100 UNIT/ML ~~LOC~~ SOLN
0.0000 [IU] | SUBCUTANEOUS | Status: DC
  Administered 2020-01-23 (×2): 15 [IU] via SUBCUTANEOUS
  Administered 2020-01-23 (×2): 7 [IU] via SUBCUTANEOUS
  Administered 2020-01-24: 15 [IU] via SUBCUTANEOUS
  Administered 2020-01-24: 7 [IU] via SUBCUTANEOUS
  Administered 2020-01-24: 4 [IU] via SUBCUTANEOUS
  Administered 2020-01-24: 11 [IU] via SUBCUTANEOUS
  Administered 2020-01-24: 4 [IU] via SUBCUTANEOUS
  Administered 2020-01-24: 7 [IU] via SUBCUTANEOUS
  Administered 2020-01-25: 4 [IU] via SUBCUTANEOUS
  Administered 2020-01-25: 7 [IU] via SUBCUTANEOUS
  Administered 2020-01-25: 11 [IU] via SUBCUTANEOUS
  Administered 2020-01-25: 4 [IU] via SUBCUTANEOUS
  Administered 2020-01-25: 20 [IU] via SUBCUTANEOUS
  Administered 2020-01-25: 7 [IU] via SUBCUTANEOUS
  Administered 2020-01-26: 3 [IU] via SUBCUTANEOUS
  Administered 2020-01-26 (×2): 7 [IU] via SUBCUTANEOUS
  Administered 2020-01-26: 4 [IU] via SUBCUTANEOUS
  Filled 2020-01-23: qty 0.2

## 2020-01-23 MED ORDER — ALBUTEROL (5 MG/ML) CONTINUOUS INHALATION SOLN
10.0000 mg/h | INHALATION_SOLUTION | RESPIRATORY_TRACT | Status: DC
  Administered 2020-01-23: 10 mg/h via RESPIRATORY_TRACT
  Filled 2020-01-23: qty 20

## 2020-01-23 MED ORDER — DORNASE ALFA 2.5 MG/2.5ML IN SOLN
1.2500 mg | Freq: Two times a day (BID) | RESPIRATORY_TRACT | Status: DC
  Administered 2020-01-23: 1.25 mg via RESPIRATORY_TRACT
  Filled 2020-01-23 (×2): qty 2.5

## 2020-01-23 MED ORDER — PREDNISONE 20 MG PO TABS: 40.0000 mg | ORAL_TABLET | Freq: Every day | ORAL | Status: DC

## 2020-01-23 MED ORDER — LEVOFLOXACIN 500 MG PO TABS
500.0000 mg | ORAL_TABLET | Freq: Every day | ORAL | Status: DC
  Administered 2020-01-23 – 2020-01-26 (×4): 500 mg via ORAL
  Filled 2020-01-23 (×4): qty 1

## 2020-01-23 MED ORDER — METHYLPREDNISOLONE SODIUM SUCC 125 MG IJ SOLR
60.0000 mg | Freq: Four times a day (QID) | INTRAMUSCULAR | Status: DC
  Administered 2020-01-23 – 2020-01-24 (×5): 60 mg via INTRAVENOUS
  Filled 2020-01-23 (×5): qty 2

## 2020-01-23 MED ORDER — SODIUM CHLORIDE 0.45 % IV SOLN: INTRAVENOUS | Status: DC

## 2020-01-23 MED ORDER — ALBUTEROL SULFATE (2.5 MG/3ML) 0.083% IN NEBU
2.5000 mg | INHALATION_SOLUTION | RESPIRATORY_TRACT | Status: DC | PRN
  Filled 2020-01-23 (×2): qty 3

## 2020-01-23 MED ORDER — ACETAMINOPHEN 650 MG RE SUPP: 650.0000 mg | Freq: Four times a day (QID) | RECTAL | Status: DC | PRN

## 2020-01-23 MED ORDER — ONDANSETRON HCL 4 MG/2ML IJ SOLN: 4.0000 mg | Freq: Four times a day (QID) | INTRAMUSCULAR | Status: DC | PRN

## 2020-01-23 MED ORDER — IPRATROPIUM BROMIDE HFA 17 MCG/ACT IN AERS
4.0000 | INHALATION_SPRAY | Freq: Once | RESPIRATORY_TRACT | Status: AC
  Administered 2020-01-23: 4 via RESPIRATORY_TRACT
  Filled 2020-01-23: qty 12.9

## 2020-01-23 MED ORDER — ONDANSETRON HCL 4 MG PO TABS: 4.0000 mg | ORAL_TABLET | Freq: Four times a day (QID) | ORAL | Status: DC | PRN

## 2020-01-23 MED ORDER — ACETYLCYSTEINE 20 % IN SOLN
3.0000 mL | Freq: Two times a day (BID) | RESPIRATORY_TRACT | Status: DC
  Administered 2020-01-24 – 2020-01-26 (×5): 3 mL via RESPIRATORY_TRACT
  Filled 2020-01-23 (×7): qty 4

## 2020-01-23 MED ORDER — INSULIN ASPART 100 UNIT/ML ~~LOC~~ SOLN
0.0000 [IU] | Freq: Three times a day (TID) | SUBCUTANEOUS | Status: DC
  Filled 2020-01-23: qty 0.2

## 2020-01-23 MED ORDER — INSULIN ASPART 100 UNIT/ML ~~LOC~~ SOLN
15.0000 [IU] | Freq: Once | SUBCUTANEOUS | Status: AC
  Administered 2020-01-23: 15 [IU] via SUBCUTANEOUS
  Filled 2020-01-23: qty 0.15

## 2020-01-23 MED ORDER — SODIUM CHLORIDE 0.9 % IV SOLN
100.0000 mg | Freq: Once | INTRAVENOUS | Status: AC
  Administered 2020-01-23: 100 mg via INTRAVENOUS
  Filled 2020-01-23: qty 100

## 2020-01-23 MED ORDER — METFORMIN HCL 500 MG PO TABS
500.0000 mg | ORAL_TABLET | Freq: Two times a day (BID) | ORAL | Status: DC
  Administered 2020-01-23: 500 mg via ORAL
  Filled 2020-01-23: qty 1

## 2020-01-23 MED ORDER — METHYLPREDNISOLONE SODIUM SUCC 125 MG IJ SOLR: 125.0000 mg | Freq: Once | INTRAMUSCULAR | Status: DC

## 2020-01-23 MED ORDER — ACETAMINOPHEN 325 MG PO TABS
650.0000 mg | ORAL_TABLET | Freq: Four times a day (QID) | ORAL | Status: DC | PRN
  Administered 2020-01-23: 650 mg via ORAL
  Filled 2020-01-23: qty 2

## 2020-01-23 MED ORDER — IPRATROPIUM-ALBUTEROL 0.5-2.5 (3) MG/3ML IN SOLN
3.0000 mL | Freq: Four times a day (QID) | RESPIRATORY_TRACT | Status: DC
  Administered 2020-01-23 (×2): 3 mL via RESPIRATORY_TRACT
  Filled 2020-01-23 (×2): qty 3

## 2020-01-23 MED ORDER — IPRATROPIUM BROMIDE 0.02 % IN SOLN
0.5000 mg | Freq: Four times a day (QID) | RESPIRATORY_TRACT | Status: DC
  Administered 2020-01-23 – 2020-01-26 (×11): 0.5 mg via RESPIRATORY_TRACT
  Filled 2020-01-23 (×11): qty 2.5

## 2020-01-23 MED ORDER — GLIPIZIDE 5 MG PO TABS
5.0000 mg | ORAL_TABLET | Freq: Every day | ORAL | Status: DC
  Administered 2020-01-23 – 2020-01-26 (×4): 5 mg via ORAL
  Filled 2020-01-23 (×6): qty 1

## 2020-01-23 MED ORDER — LEVALBUTEROL HCL 1.25 MG/0.5ML IN NEBU
1.2500 mg | INHALATION_SOLUTION | Freq: Four times a day (QID) | RESPIRATORY_TRACT | Status: DC
  Administered 2020-01-23 – 2020-01-26 (×11): 1.25 mg via RESPIRATORY_TRACT
  Filled 2020-01-23 (×12): qty 0.5

## 2020-01-23 MED ORDER — ACETYLCYSTEINE 10 % IN SOLN
2.0000 mL | Freq: Two times a day (BID) | RESPIRATORY_TRACT | Status: DC
  Filled 2020-01-23: qty 4

## 2020-01-23 MED ORDER — MAGNESIUM SULFATE 2 GM/50ML IV SOLN: 2.0000 g | Freq: Once | INTRAVENOUS | Status: DC

## 2020-01-23 MED ORDER — GUAIFENESIN-DM 100-10 MG/5ML PO SYRP
10.0000 mL | ORAL_SOLUTION | Freq: Four times a day (QID) | ORAL | Status: DC
  Administered 2020-01-23 – 2020-01-26 (×13): 10 mL via ORAL
  Filled 2020-01-23 (×12): qty 10

## 2020-01-23 NOTE — ED Notes (Signed)
Attempted to call report to [facility] RN number given [phone] Also attempted to call [facility] [place], no answer.

## 2020-01-23 NOTE — ED Notes (Signed)
He continues to do and feel well on his O2 at 2 l.p.m. he has no requests at this time.

## 2020-01-23 NOTE — Progress Notes (Signed)
PROGRESS NOTE    Patient: Duane Cuevas                            PCP: [name], MD                    DOB: 03/11/1956            DOA: 01/23/2020 [id number]:[id number]             DOS: 01/23/2020, 1:37 PM   LOS: 0 days   Date of Service: The patient was seen and examined on 01/23/2020  Subjective:   The patient was seen and examined this morning, remains in respite distress, on BiPAP. But awake alert, following commands No further issues overnight   Brief Narrative:   Friedrich Harriott is a 64 y.o. male with medical history significant of type 2 diabetes, emphysema/COPD, hepatic asteatosis, thrombocytopenia, CAD, history of MI, hyperlipidemia who is returning to the emergency department after being seen yesterday morning due to COPD exacerbation with recurrence/worsening of his symptoms.     CTA and chest radiograph showed wall thickening of bilateral lower lobe bronchi likely due to chronic inflammation  SARS coronavirus 2 was negative.   Assessment & Plan:   Principal Problem:   Acute respiratory failure with hypoxia (HCC) Active Problems:   COPD (chronic obstructive pulmonary disease) (HCC)   Type 2 diabetes mellitus with hyperglycemia, without long-term current use of insulin (HCC)   Hyperlipidemia   CAD (coronary artery disease)     Acute respiratory failure with hypoxia (HCC)  COPD (chronic obstructive pulmonary disease) ([place]) -Continue on stepdown unit -We will continue on BiPAP, plan to wean down to O2 by nasal cannula already showing signs of improvement... -We will continue IV Solu-Medrol with a quick taper -RT consulted for pulmonary toiletry -We will continue DuoNeb bronchodilators every 6 hours and scheduled -Continuing empiric antibiotics, mucolytic's and toxins  Continue supplemental oxygen.   Type 2 diabetes mellitus with hyperglycemia,   without long-term current use of insulin (HCC) -Anticipate hyperglycemia as patient will be on IV  steroids -We will check blood sugar QA CHS with SSI coverage, Lantus initiated -Resuming home medication of glipizide 5 mg daily -Holding home medication of Metformin (500 mg p.o. 3 times daily) Continue 0.45% NaCl at 100 mL/h     Hyperlipidemia No longer taking atorvastatin.    CAD (coronary artery disease) Not on statin or beta-blocker. Resume daily aspirin.    DVT prophylaxis:      Lovenox SQ. Code Status:              Full code. Family Communication:     Discussed with patient    Disposition Plan:              Patient is from:                        Home.             Anticipated DC to:                   Home.             Anticipated DC date:               01/25/2020.             Anticipated DC barriers:         Clinical improvement  Consults called: none at this time Admission status:     Stepdown/observation.   Severity of Illness: Very high... Remain inpatient for continuous respiratory failure.  Cultures; Blood Cultures x 2 >> NGT Urine Culture  >>> NGT  Sputum Culture >> NGT   Antimicrobials:    Procedures:   No admission procedures for hospital encounter.     Antimicrobials:  Anti-infectives (From admission, onward)   Start     Dose/Rate Route Frequency Ordered Stop   01/23/20 1000  levofloxacin (LEVAQUIN) tablet 500 mg     Discontinue     500 mg Oral Daily 01/23/20 0809 01/28/20 0959   01/23/20 0030  doxycycline (VIBRAMYCIN) 100 mg in sodium chloride 0.9 % 250 mL IVPB        100 mg 125 mL/hr over 120 Minutes Intravenous  Once 01/23/20 0024 01/23/20 0330       Medication:  . aspirin EC  81 mg Oral Daily  . glipiZIDE  5 mg Oral Q breakfast  . guaiFENesin-dextromethorphan  10 mL Oral Q6H  . insulin aspart  0-20 Units Subcutaneous Q4H  . insulin glargine  10 Units Subcutaneous Daily  . ipratropium-albuterol  3 mL Nebulization Q6H  . levofloxacin  500 mg Oral Daily  . metFORMIN  500 mg Oral BID WC  . methylPREDNISolone  (SOLU-MEDROL) injection  60 mg Intravenous Q6H    acetaminophen **OR** acetaminophen, albuterol, chlorpheniramine-HYDROcodone, ondansetron **OR** ondansetron (ZOFRAN) IV   Objective:   Vitals:   01/23/20 1112 01/23/20 1122 01/23/20 1132 01/23/20 1334  BP:   123/69 (!) 114/58  Pulse: 103 (!) 111 (!) 108 (!) 114  Resp: 23 (!) 24 22 (!) 27  Temp:      TempSrc:      SpO2: 95% 95% 96% 97%   No intake or output data in the 24 hours ending 01/23/20 1337 There were no vitals filed for this visit.   Examination:   Physical Exam  Constitution:  Alert, cooperative, no distress,  Appears calm and comfortable  Psychiatric: Normal and stable mood and affect, cognition intact,   HEENT: Normocephalic, PERRL, otherwise with in Normal limits  Chest:Chest symmetric Cardio vascular:  S1/S2, RRR, No murmure, No Rubs or Gallops  pulmonary: Diffusely wheezing, rhonchi, positive breath sounds throughout lung fields, never any crackles Abdomen: Soft, non-tender, non-distended, bowel sounds,no masses, no organomegaly Muscular skeletal: Limited exam - in bed, able to move all 4 extremities, Normal strength,  Neuro: CNII-XII intact. , normal motor and sensation, reflexes intact  Extremities: No pitting edema lower extremities, +2 pulses  Skin: Dry, warm to touch, negative for any Rashes, No open wounds Wounds: per nursing documentation    ------------------------------------------------------------------------------------------------------------------------------------------    LABs:  CBC Latest Ref Rng & Units 01/23/2020 01/23/2020 01/23/2020  WBC 4.0 - 10.5 K/uL 8.9 - 9.6  Hemoglobin 13.0 - 17.0 g/dL 14.4 14.3 13.9  Hematocrit 39 - 52 % 43.0 42.0 41.6  Platelets 150 - 400 K/uL 210 - 200   CMP Latest Ref Rng & Units 01/23/2020 01/23/2020 01/22/2020  Glucose 70 - 99 mg/dL 324(H) 356(H) 266(H)  BUN 8 - 23 mg/dL 22 28(H) 15  Creatinine 0.61 - 1.24 mg/dL 1.06 0.80 0.89  Sodium 135 - 145 mmol/L 139 137  139  Potassium 3.5 - 5.1 mmol/L 4.0 5.1 3.9  Chloride 98 - 111 mmol/L 104 104 102  CO2 22 - 32 mmol/L 20(L) - 25  Calcium 8.9 - 10.3 mg/dL 9.5 - 9.3  Total Protein 6.5 - 8.1 g/dL 7.5 - -  Total Bilirubin 0.3 - 1.2 mg/dL 0.7 - -  Alkaline Phos 38 - 126 U/L 67 - -  AST 15 - 41 U/L 17 - -  ALT 0 - 44 U/L 20 - -       Micro Results Recent Results (from the past 240 hour(s))  SARS Coronavirus 2 by RT PCR (hospital order, performed in [facility] hospital lab) Nasopharyngeal Nasopharyngeal Swab     Status: None   Collection Time: 01/22/20 12:19 PM   Specimen: Nasopharyngeal Swab  Result Value Ref Range Status   SARS Coronavirus 2 NEGATIVE NEGATIVE Final    Comment: (NOTE) SAPerformed at [facility], [place] [address], [place], [place] [address]   SARS Coronavirus 2 by RT PCR (hospital order, performed in [facility] hospital lab) Nasopharyngeal Nasopharyngeal Swab     Status: None   Collection Time: 01/23/20 12:43 AM   Specimen: Nasopharyngeal Swab  Result Value Ref Range Status   SARS Coronavirus 2 NEGATIVE NEGATIVE Final    Comment: (NOTE) SARS-CoV-2 target nucleic acids are NOT DETECTED.  The SARS-CoV-2 RNA is generally detectable in upper and lower respiratory specimens during the acute phase of infection. The lowest concentration of SARS-CoV-2 viral copies this assay can detect is 250 copies / mL. A negative result does not preclude SARS-CoV-2 infePerformed at [place] [address], [place],  [address]     Radiology Reports DG Chest 2 View  Result Date: 01/22/2020 CLINICAL DATA:  Chest pain. EXAM: CHEST - 2 VIEW COMPARISON:  November 01, 2019. FINDINGS: The heart size and mediastinal contours are within normal limits. Both lungs are clear. No pneumothorax or pleural effusion is noted. The visualized skeletal structures are unremarkable. IMPRESSION: No active cardiopulmonary disease. Electronically Signed   By: Marijo Conception  M.D.   On: 01/22/2020 11:24   CT Angio Chest PE W and/or Wo Contrast  Result Date: 01/22/2020 CLINICAL DATA:  Chest pain. EXAM: CT ANGIOGRAPHY CHEST WITH CONTRAST TECHNIQUE: Multidetector CT imaging of the chest was performed using the standard protocol during bolus administration of intravenous contrast. Multiplanar CT image reconstructions and MIPs were obtained to evaluate the vascular anatomy. CONTRAST:  49mL OMNIPAQUE IOHEXOL 350 MG/ML SOLN COMPARISON:  April 13, 2012. FINDINGS: Cardiovascular: Satisfactory opacification of the pulmonary arteries to the segmental level. No evidence of pulmonary embolism. Normal heart size. No pericardial effusion. Atherosclerosis of thoracic aorta is noted without aneurysm formation. Mediastinum/Nodes: No enlarged mediastinal, hilar, or axillary lymph nodes. Thyroid gland, trachea, and esophagus demonstrate no significant findings. Lungs/Pleura: No pneumothorax or pleural effusion is noted. Emphysematous disease is noted in both upper lobes. Wall thickening of bilateral lower lobe bronchi is noted concerning for chronic inflammation. Upper Abdomen: No acute abnormality. Musculoskeletal: No chest wall abnormality. No acute or significant osseous findings. Review of the MIP images confirms the above findings. IMPRESSION: 1. No definite evidence of pulmonary embolus. 2. Wall thickening of bilateral lower lobe bronchi is noted concerning for chronic inflammation. Aortic Atherosclerosis (ICD10-I70.0) and Emphysema (ICD10-J43.9). Electronically Signed   By: Marijo Conception M.D.   On: 01/22/2020 15:01   DG Chest Portable 1 View  Result Date: 01/23/2020 CLINICAL DATA:  Chest pain and tightness, COPD, emphysema EXAM: PORTABLE CHEST 1 VIEW COMPARISON:  01/22/2020 FINDINGS: Single frontal view of the chest demonstrates a stable cardiac silhouette. Extensive upper lobe predominant background emphysema is again noted, unchanged. Perihilar bronchovascular prominence consistent with  bronchial wall thickening on previous CT. No airspace disease, effusion, or  pneumothorax. IMPRESSION: 1. Persistent bronchial wall thickening consistent with chronic bronchitis. 2. Stable emphysema. 3. No acute airspace disease. Electronically Signed   By: Randa Ngo M.D.   On: 01/23/2020 00:27    SIGNED: Deatra James, MD, FACP, FHM. [facility],  Pager (please use [email or web address] to page/text)  If 7PM-7AM, please contact night-coverage [email or web address] 01/23/2020, 1:37 PM

## 2020-01-23 NOTE — H&P (Signed)
History and Physical    Duane Cuevas LKT:[id number] DOB: 1955-11-01 DOA: 01/23/2020  PCP: [name], MD   Patient coming from: Home.  I have personally briefly reviewed patient's old medical records in [place]  Chief Complaint: Shortness of breath.  HPI: Duane Cuevas is a 64 y.o. male with medical history significant of type 2 diabetes, emphysema/COPD, hepatic asteatosis, thrombocytopenia, CAD, history of MI, hyperlipidemia who is returning to the emergency department after being seen yesterday morning due to COPD exacerbation with recurrence/worsening of his symptoms.  He mentioned that his symptoms started last week with cough of whitish/light yellowish sputum, pleuritic chest pain, wheezing and progressively worse dyspnea.  He was discharged on prednisone, azithromycin and albuterol MDI.  However, he has not been able to get these medications before he had to return to emergency department.  He denies fever, chills, sore throat, rhinorrhea, dizziness, lower extremity edema, abdominal pain, nausea, vomiting, diarrhea, constipation, melena or hematochezia.  No dysuria, frequency or hematuria.  No polyuria, polydipsia, polyphagia or blurred vision.  ED Course: Initial vital signs were temperature 98.7 F, pulse 108, respiration 18, blood pressure 115/63 mmHg and O2 sat 97% on nasal cannula oxygen.  The patient had received earlier supplemental oxygen, bronchodilators, Solu-Medrol from EMS and previous ED visit.  He was given a continuous 10 mg albuterol neb on his second visit to the ED.  CBC showed a white count of 9.6 with 95% neutrophils, 3% lymphocytes 1% monocytes.  EKG no ischemic changes.  Troponin was normal twice.  I-STAT Chem-8, showed a BUN of 28, glucose of 356 mg/dL.  SARS coronavirus 2 was negative.  Imaging: CTA and chest radiograph showed wall thickening of bilateral lower lobe bronchi likely due to chronic inflammation.  Please see images and full  radiology report for further detail.  Review of Systems: As per HPI otherwise all other systems reviewed and are negative.  Past Medical History:  Diagnosis Date  . Diabetes mellitus ([place]) 10/2014   pt denies being diabetic.   . Emphysema of lung ([place])   . Emphysema/COPD ([place]) 10/2014  . Hepatic steatosis   . Thrombocytopenia ([place]) 09/2015   platelets in 120s.    Past Surgical History:  Procedure Laterality Date  . ESOPHAGOGASTRODUODENOSCOPY N/A 09/23/2015   Procedure: ESOPHAGOGASTRODUODENOSCOPY (EGD);  Surgeon: Ladene Artist, MD;  Location: [facility];  Service: Endoscopy;  Laterality: N/A;  . FOOT SURGERY    . INGUINAL HERNIA REPAIR Left 05/15/2018   Procedure: LEFT INGUINAL HERNIA REPAIR WITH MESH;  Surgeon: Erroll Luna, MD;  Location: [facility];  Service: General;  Laterality: Left;  . INSERTION OF MESH Left 05/15/2018   Procedure: INSERTION OF MESH;  Surgeon: Erroll Luna, MD;  Location: [facility];  Service: General;  Laterality: Left;  . UPPER GASTROINTESTINAL ENDOSCOPY     Social History  reports that he has been smoking cigarettes. He has a 7.05 pack-year smoking history. He has never used smokeless tobacco. He reports current alcohol use. He reports that he does not use drugs.  No Known Allergies  Family History  Problem Relation Age of Onset  . Emphysema Mother   . Diabetes Mother   . Emphysema Father   . Cancer Sister        Stomach cancer  . Diabetes Sister   . Stomach cancer Sister   . Diabetes Brother   . Colon cancer Neg Hx   . Colon polyps Neg Hx   . Esophageal cancer Neg Hx   .  Rectal cancer Neg Hx    Prior to Admission medications   Medication Sig Start Date End Date Taking? Authorizing Provider  Multiple Vitamin (MULTIVITAMIN) capsule Take 1 capsule by mouth daily.   Yes [provider]  albuterol (PROVENTIL) (2.5 MG/3ML) 0.083% nebulizer solution Take 3 mLs (2.5 mg total) by nebulization every 6 (six) hours as needed for wheezing or  shortness of breath. Patient not taking: Reported on 01/22/2020 11/25/19   [name], MD  aspirin EC 81 MG EC tablet Take 1 tablet (81 mg total) by mouth daily. Patient not taking: Reported on 01/22/2020 11/03/19   Annita Brod, MD  atorvastatin (LIPITOR) 20 MG tablet Take 1 tablet (20 mg total) by mouth daily. Patient not taking: Reported on 01/22/2020 08/26/19   [name], MD  azithromycin (ZITHROMAX Z-PAK) 250 MG tablet 2 po day one, then 1 daily x 4 days 01/22/20   Veryl Speak, MD  budesonide-formoterol [facility]) 160-4.5 MCG/ACT inhaler Inhale 2 puffs into the lungs 2 (two) times daily. Patient not taking: Reported on 01/22/2020 08/26/19   [name], MD  glipiZIDE (GLUCOTROL) 5 MG tablet Take 1 tablet (5 mg total) by mouth daily with breakfast. 08/26/19 11/24/19  [name], MD  metFORMIN (GLUCOPHAGE) 500 MG tablet Take 1 tablet (500 mg total) by mouth 2 (two) times daily with a meal. Patient not taking: Reported on 01/22/2020 08/26/19   [name], MD  nicotine (NICODERM CQ - DOSED IN MG/24 HOURS) 14 mg/24hr patch Place 1 patch (14 mg total) onto the skin daily. Patient not taking: Reported on 01/22/2020 11/03/19   Annita Brod, MD  OXYGEN Inhale 2 L into the lungs daily as needed (SOB).     [provider]  predniSONE (DELTASONE) 10 MG tablet Take 2 tablets (20 mg total) by mouth 2 (two) times daily with a meal. 01/22/20   Veryl Speak, MD    Physical Exam: Vitals:   01/23/20 0200 01/23/20 0330 01/23/20 0500 01/23/20 0604  BP: 109/67 (!) 144/79 107/65 128/82  Pulse: (!) 101 (!) 123 (!) 107 (!) 113  Resp: 12 16 (!) 21 14  Temp:      TempSrc:      SpO2: 98% 94% 94% 97%    Constitutional: Looks acutely ill. Eyes: PERRL, lids and conjunctivae mildly injected. ENMT: Mucous membranes are moist. Posterior pharynx clear of any exudate or lesions. Neck: normal, supple, no masses, no thyromegaly Respiratory: Tachypneic in  the low to mid 20s, decreased breath sounds with rhonchi and wheezing bilaterally.  No accessory muscle use.  Cardiovascular: Tachycardic at 114 bpm, no murmurs / rubs / gallops. No extremity edema. 2+ pedal pulses. No carotid bruits.  Abdomen: Nondistended, BS positive.  Soft, no tenderness, no masses palpated. No hepatosplenomegaly. Musculoskeletal: no clubbing / cyanosis. Good ROM, no contractures. Normal muscle tone.  Skin: no rashes, lesions, ulcers on limited dermatological examination. Neurologic: CN 2-12 grossly intact. Sensation intact, DTR normal. Strength 5/5 in all 4.  Psychiatric: Normal judgment and insight. Alert and oriented x 3. Normal mood.   Labs on Admission: I have personally reviewed following labs and imaging studies  CBC: Recent Labs  Lab 01/22/20 1058 01/23/20 0043 01/23/20 0050 01/23/20 0442  WBC 12.2* 9.6  --  8.9  NEUTROABS  --  9.1*  --  8.7*  HGB 14.9 13.9 14.3 14.4  HCT 43.3 41.6 42.0 43.0  MCV 93.9 96.7  --  96.6  PLT 201 200  --  683    Basic Metabolic Panel: Recent Labs  Lab 01/22/20 1058 01/23/20 0050 01/23/20 0532  NA 139 137 139  K 3.9 5.1 4.0  CL 102 104 104  CO2 25  --  20*  GLUCOSE 266* 356* 324*  BUN 15 28* 22  CREATININE 0.89 0.80 1.06  CALCIUM 9.3  --  9.5    GFR: CrCl cannot be calculated (Unknown ideal weight.).  Liver Function Tests: Recent Labs  Lab 01/23/20 0532  AST 17  ALT 20  ALKPHOS 67  BILITOT 0.7  PROT 7.5  ALBUMIN 4.0   Radiological Exams on Admission: DG Chest 2 View  Result Date: 01/22/2020 CLINICAL DATA:  Chest pain. EXAM: CHEST - 2 VIEW COMPARISON:  November 01, 2019. FINDINGS: The heart size and mediastinal contours are within normal limits. Both lungs are clear. No pneumothorax or pleural effusion is noted. The visualized skeletal structures are unremarkable. IMPRESSION: No active cardiopulmonary disease. Electronically Signed   By: Marijo Conception M.D.   On: 01/22/2020 11:24   CT Angio Chest PE W  and/or Wo Contrast  Result Date: 01/22/2020 CLINICAL DATA:  Chest pain. EXAM: CT ANGIOGRAPHY CHEST WITH CONTRAST TECHNIQUE: Multidetector CT imaging of the chest was performed using the standard protocol during bolus administration of intravenous contrast. Multiplanar CT image reconstructions and MIPs were obtained to evaluate the vascular anatomy. CONTRAST:  22mL OMNIPAQUE IOHEXOL 350 MG/ML SOLN COMPARISON:  April 13, 2012. FINDINGS: Cardiovascular: Satisfactory opacification of the pulmonary arteries to the segmental level. No evidence of pulmonary embolism. Normal heart size. No pericardial effusion. Atherosclerosis of thoracic aorta is noted without aneurysm formation. Mediastinum/Nodes: No enlarged mediastinal, hilar, or axillary lymph nodes. Thyroid gland, trachea, and esophagus demonstrate no significant findings. Lungs/Pleura: No pneumothorax or pleural effusion is noted. Emphysematous disease is noted in both upper lobes. Wall thickening of bilateral lower lobe bronchi is noted concerning for chronic inflammation. Upper Abdomen: No acute abnormality. Musculoskeletal: No chest wall abnormality. No acute or significant osseous findings. Review of the MIP images confirms the above findings. IMPRESSION: 1. No definite evidence of pulmonary embolus. 2. Wall thickening of bilateral lower lobe bronchi is noted concerning for chronic inflammation. Aortic Atherosclerosis (ICD10-I70.0) and Emphysema (ICD10-J43.9). Electronically Signed   By: Marijo Conception M.D.   On: 01/22/2020 15:01   DG Chest Portable 1 View  Result Date: 01/23/2020 CLINICAL DATA:  Chest pain and tightness, COPD, emphysema EXAM: PORTABLE CHEST 1 VIEW COMPARISON:  01/22/2020 FINDINGS: Single frontal view of the chest demonstrates a stable cardiac silhouette. Extensive upper lobe predominant background emphysema is again noted, unchanged. Perihilar bronchovascular prominence consistent with bronchial wall thickening on previous CT. No  airspace disease, effusion, or pneumothorax. IMPRESSION: 1. Persistent bronchial wall thickening consistent with chronic bronchitis. 2. Stable emphysema. 3. No acute airspace disease. Electronically Signed   By: Randa Ngo M.D.   On: 01/23/2020 00:27    EKG: Independently reviewed.  Vent. rate 113 BPM PR interval * ms QRS duration 107 ms QT/QTc 341/468 ms P-R-T axes 84 81 75 Sinus tachycardia Biatrial enlargement  Assessment/Plan Principal Problem:   Acute respiratory failure with hypoxia (HCC)  COPD (chronic obstructive pulmonary disease) (HCC) Observation/stepdown. Continue supplemental oxygen. DuoNeb every 6 hours. Albuterol 2.5 mg neb every 4 hours PRN. BiPAP ventilation as needed. Check procalcitonin before further antibiotic RX.  Active Problems:    Type 2 diabetes mellitus with hyperglycemia,     without long-term current use of insulin (HCC) Resume glipizide 5 mg p.o. daily.  Resume Metformin 500 mg p.o. 3 times daily. Continue 0.45% NaCl at 100 mL/h CBG monitoring with RI SS every 4 hours while on glucocorticoids.    Hyperlipidemia No longer taking atorvastatin.    CAD (coronary artery disease) Not on statin or beta-blocker. Resume daily aspirin.    DVT prophylaxis: Lovenox SQ. Code Status:   Full code. Family Communication:   Disposition Plan:   Patient is from:  Home.  Anticipated DC to:  Home.  Anticipated DC date:  01/25/2020.  Anticipated DC barriers: Clinical improvement  Consults called: Admission status:  Stepdown/observation.   Severity of Illness: Very high.  Reubin Milan MD [facility]  How to contact the [facility] Attending or Consulting provider [place] or covering provider during after hours [place], for this patient?   1. Check the care team in [facility] and look for a) attending/consulting [facility] provider listed and b) the [facility] team listed 2. Log into [email or web address] and use [place]'s universal password to access. If you do not have  the password, please contact the hospital operator. 3. Locate the [facility] provider you are looking for under [facility] and page to a number that you can be directly reached. 4. If you still have difficulty reaching the provider, please page the [facility] (Director on Call) for the Hospitalists listed on amion for assistance.  01/23/2020, 7:16 AM   This document was prepared using Dragon voice recognition software and may contain some unintended transcription errors.

## 2020-01-23 NOTE — Evaluation (Signed)
Physical Therapy Evaluation Patient Details Name: Duane Cuevas MRN: [id number] DOB: [birth year] Today's Date: 01/23/2020   History of Present Illness  Pt is 64 yo male with PMH of DM2, COPD, hepatic asteatosis, thrombocytonpnia, CAD, MI, and hyperlipidemia.  Pt admitted with COPD exacerbation.  Clinical Impression  Pt admitted with COPD exacerbation.  He presents with normal gait pattern, good balance, and strong throughout.  Pt was limited in gait distance due to elevated HR; however, no skilled PT indicated.  Recommended and encouraged ambulation with nursing and then with family once HR managed.    Follow Up Recommendations No PT follow up    Equipment Recommendations  None recommended by PT    Recommendations for Other Services       Precautions / Restrictions Precautions Precautions: None      Mobility  Bed Mobility Overal bed mobility: Independent                Transfers Overall transfer level: Independent                  Ambulation/Gait Ambulation/Gait assistance: Supervision Gait Distance (Feet): 100 Feet Assistive device: None Gait Pattern/deviations: WFL(Within Functional Limits)     General Gait Details: normal gait; limited by elevated HR  Stairs            Wheelchair Mobility    Modified Rankin (Stroke Patients Only)       Balance Overall balance assessment: Independent                                           Pertinent Vitals/Pain Pain Assessment: No/denies pain    Home Living Family/patient expects to be discharged to:: Private residence Living Arrangements: Spouse/significant other;Children Available Help at Discharge: Family;Available 24 hours/day Type of Home: House Home Access: Level entry     Home Layout: Two level Home Equipment: None      Prior Function Level of Independence: Independent   Gait / Transfers Assistance Needed: Independent with community ambulation no AD  ADL's /  Homemaking Assistance Needed: Independent with ADLs and IADLs  Comments: Does have 2 LPM Home O2 but only wears as needed     Hand Dominance        Extremity/Trunk Assessment   Upper Extremity Assessment Upper Extremity Assessment: Defer to OT evaluation    Lower Extremity Assessment Lower Extremity Assessment: Overall WFL for tasks assessed (ROM WFL: MMT 5/5 - strong throughout)    Cervical / Trunk Assessment Cervical / Trunk Assessment: Normal  Communication   Communication: Prefers language other than English  Cognition Arousal/Alertness: Awake/alert Behavior During Therapy: WFL for tasks assessed/performed Overall Cognitive Status: Within Functional Limits for tasks assessed                                        General Comments General comments (skin integrity, edema, etc.): Pt's HR 120 bpm rest up to 145 bpm with gait; on 2 LPM O2 with sats 99% rest, 92% walking    Exercises     Assessment/Plan    PT Assessment Patent does not need any further PT services  PT Problem List         PT Treatment Interventions      PT Goals (Current goals can be found in the Care Plan  section)  Acute Rehab PT Goals Patient Stated Goal: return home PT Goal Formulation: All assessment and education complete, DC therapy    Frequency     Barriers to discharge        Co-evaluation               AM-PAC PT "6 Clicks" Mobility  Outcome Measure Help needed turning from your back to your side while in a flat bed without using bedrails?: None Help needed moving from lying on your back to sitting on the side of a flat bed without using bedrails?: None Help needed moving to and from a bed to a chair (including a wheelchair)?: None Help needed standing up from a chair using your arms (e.g., wheelchair or bedside chair)?: None Help needed to walk in hospital room?: None Help needed climbing 3-5 steps with a railing? : None 6 Click Score: 24    End of  Session Equipment Utilized During Treatment: Oxygen;Gait belt Activity Tolerance: Patient tolerated treatment well Patient left: in bed;with call bell/phone within reach;with family/visitor present Nurse Communication: Mobility status      Time: 5051-8335 PT Time Calculation (min) (ACUTE ONLY): 20 min   Charges:   PT Evaluation $PT Eval Low Complexity: 1 Low          Yasenia Reedy, PT Acute Rehab Services Pager [phone] [facility] 01/23/2020, 3:58 PM

## 2020-01-23 NOTE — ED Notes (Signed)
Pt ambulated with pulse ox. Pt's pulse increased to 127 and respirations increased to 30. Pt's oxygen levels dropped from 96 to 90 on 2L

## 2020-01-23 NOTE — ED Notes (Signed)
RT notified regarding patient needing a breathing treatment

## 2020-01-23 NOTE — Progress Notes (Addendum)
Removed PT from BiPAP at approximately 0845- RN aware and encouraged to contact RT if BiPAP needed. Placed PT on 2 LPM as used at home. PT does not appear to be in respiratory distress at this time and confirms he is breathing "ok" at this time. PT does convey that his breathing feels "ok" until he starts coughing and then has shortness of breath.

## 2020-01-23 NOTE — ED Provider Notes (Signed)
[facility] DEPT Provider Note   CSN: [id number] Arrival date & time: 01/23/20  0004     History Chief Complaint  Patient presents with   Shortness of Breath    Duane Cuevas is a 64 y.o. male.  The history is provided by the EMS personnel and the patient. The history is limited by the condition of the patient and a language barrier.  Shortness of Breath Severity:  Severe Onset quality:  Gradual Duration:  5 days Timing:  Constant Progression:  Worsening Chronicity:  Recurrent Context: not activity   Relieved by:  Nothing Worsened by:  Nothing Ineffective treatments:  Inhaler Associated symptoms: wheezing   Associated symptoms: no abdominal pain, no claudication, no fever and no vomiting   Risk factors: no hx of PE/DVT   Patient with known COPD, seen earlier today and started on inhalers, steroids and antibiotics presents as symptoms are not improved.  Ruled out for MI and PE in the ED earlier this afternoon.  No f/c/r.  EMS gave magnesium and solumedrol en route.       Past Medical History:  Diagnosis Date   Diabetes mellitus ([place]) 10/2014   pt denies being diabetic.    Emphysema of lung ([place])    Emphysema/COPD ([place]) 10/2014   Hepatic steatosis    Thrombocytopenia ([place]) 09/2015   platelets in 120s.     Patient Active Problem List   Diagnosis Date Noted   Hyperlipidemia 11/02/2019   History of MI (myocardial infarction) 11/02/2019   Chest pain 11/01/2019   Cigarette smoker 08/15/2018   COPD ? GOLD III/ active smoker 08/14/2018   History of diet-controlled diabetes 06/30/2018   COPD (chronic obstructive pulmonary disease) ([place]) 10/14/2014   Type 2 diabetes mellitus with hyperglycemia, without long-term current use of insulin ([place]) 10/14/2014    Past Surgical History:  Procedure Laterality Date   ESOPHAGOGASTRODUODENOSCOPY N/A 09/23/2015   Procedure: ESOPHAGOGASTRODUODENOSCOPY (EGD);  Surgeon: Ladene Artist, MD;   Location: [facility];  Service: Endoscopy;  Laterality: N/A;   FOOT SURGERY     INGUINAL HERNIA REPAIR Left 05/15/2018   Procedure: LEFT INGUINAL HERNIA REPAIR WITH MESH;  Surgeon: Erroll Luna, MD;  Location: [facility];  Service: General;  Laterality: Left;   INSERTION OF MESH Left 05/15/2018   Procedure: INSERTION OF MESH;  Surgeon: Erroll Luna, MD;  Location: [facility];  Service: General;  Laterality: Left;   UPPER GASTROINTESTINAL ENDOSCOPY         Family History  Problem Relation Age of Onset   Emphysema Mother    Diabetes Mother    Emphysema Father    Cancer Sister        Stomach cancer   Diabetes Sister    Stomach cancer Sister    Diabetes Brother    Colon cancer Neg Hx    Colon polyps Neg Hx    Esophageal cancer Neg Hx    Rectal cancer Neg Hx     Social History   Tobacco Use   Smoking status: Current Every Day Smoker    Packs/day: 0.15    Years: 47.00    Pack years: 7.05    Types: Cigarettes   Smokeless tobacco: Never Used  Scientific laboratory technician Use: Former  Substance Use Topics   Alcohol use: Yes    Comment: occ   Drug use: No    Home Medications Prior to Admission medications   Medication Sig Start Date End Date Taking? Authorizing Provider  albuterol (PROVENTIL) (2.5  MG/3ML) 0.083% nebulizer solution Take 3 mLs (2.5 mg total) by nebulization every 6 (six) hours as needed for wheezing or shortness of breath. Patient not taking: Reported on 01/22/2020 11/25/19   [name], MD  aspirin EC 81 MG EC tablet Take 1 tablet (81 mg total) by mouth daily. Patient not taking: Reported on 01/22/2020 11/03/19   Annita Brod, MD  atorvastatin (LIPITOR) 20 MG tablet Take 1 tablet (20 mg total) by mouth daily. Patient not taking: Reported on 01/22/2020 08/26/19   [name], MD  azithromycin (ZITHROMAX Z-PAK) 250 MG tablet 2 po day one, then 1 daily x 4 days 01/22/20   Veryl Speak, MD  budesonide-formoterol [facility]) 160-4.5  MCG/ACT inhaler Inhale 2 puffs into the lungs 2 (two) times daily. Patient not taking: Reported on 01/22/2020 08/26/19   [name], MD  glipiZIDE (GLUCOTROL) 5 MG tablet Take 1 tablet (5 mg total) by mouth daily with breakfast. 08/26/19 11/24/19  [name], MD  metFORMIN (GLUCOPHAGE) 500 MG tablet Take 1 tablet (500 mg total) by mouth 2 (two) times daily with a meal. Patient not taking: Reported on 01/22/2020 08/26/19   [name], MD  Multiple Vitamin (MULTIVITAMIN) capsule Take 1 capsule by mouth daily.    [provider]  nicotine (NICODERM CQ - DOSED IN MG/24 HOURS) 14 mg/24hr patch Place 1 patch (14 mg total) onto the skin daily. Patient not taking: Reported on 01/22/2020 11/03/19   Annita Brod, MD  OXYGEN Inhale 2 L into the lungs daily as needed (SOB).     [provider]  predniSONE (DELTASONE) 10 MG tablet Take 2 tablets (20 mg total) by mouth 2 (two) times daily with a meal. 01/22/20   Veryl Speak, MD    Allergies    Patient has no known allergies.  Review of Systems   Review of Systems  Constitutional: Negative for fever.  HENT: Negative for congestion.   Eyes: Negative for visual disturbance.  Respiratory: Positive for shortness of breath and wheezing.   Cardiovascular: Negative for palpitations and claudication.  Gastrointestinal: Negative for abdominal pain and vomiting.  Genitourinary: Negative for difficulty urinating.  Musculoskeletal: Negative for arthralgias.  Neurological: Negative for dizziness.  Psychiatric/Behavioral: Negative for agitation.  All other systems reviewed and are negative.   Physical Exam Updated Vital Signs BP 115/63 (BP Location: Right Arm)    Pulse (!) 108    Temp 98.7 F (37.1 C) (Oral)    Resp 18    SpO2 97%   Physical Exam Vitals and nursing note reviewed.  Constitutional:      Appearance: Normal appearance. He is not diaphoretic.  HENT:     Head: Normocephalic and atraumatic.      Nose: Nose normal.  Eyes:     Conjunctiva/sclera: Conjunctivae normal.     Pupils: Pupils are equal, round, and reactive to light.  Cardiovascular:     Rate and Rhythm: Normal rate and regular rhythm.     Pulses: Normal pulses.     Heart sounds: Normal heart sounds.  Pulmonary:     Breath sounds: Wheezing present.  Abdominal:     General: Abdomen is flat. Bowel sounds are normal.     Palpations: Abdomen is soft.     Tenderness: There is no abdominal tenderness. There is no guarding or rebound.  Musculoskeletal:        General: Normal range of motion.     Cervical back: Normal range of motion and neck  supple.  Skin:    General: Skin is warm and dry.     Capillary Refill: Capillary refill takes less than 2 seconds.  Neurological:     General: No focal deficit present.     Mental Status: He is alert and oriented to person, place, and time.     Deep Tendon Reflexes: Reflexes normal.  Psychiatric:        Mood and Affect: Mood normal.        Behavior: Behavior normal.     ED Results / Procedures / Treatments   Labs (all labs ordered are listed, but only abnormal results are displayed) Results for orders placed or performed during the hospital encounter of 01/22/20  SARS Coronavirus 2 by RT PCR (hospital order, performed in [facility] hospital lab) Nasopharyngeal Nasopharyngeal Swab   Specimen: Nasopharyngeal Swab  Result Value Ref Range   SARS Coronavirus 2 NEGATIVE NEGATIVE  Basic metabolic panel  Result Value Ref Range   Sodium 139 135 - 145 mmol/L   Potassium 3.9 3.5 - 5.1 mmol/L   Chloride 102 98 - 111 mmol/L   CO2 25 22 - 32 mmol/L   Glucose, Bld 266 (H) 70 - 99 mg/dL   BUN 15 8 - 23 mg/dL   Creatinine, Ser 0.89 0.61 - 1.24 mg/dL   Calcium 9.3 8.9 - 10.3 mg/dL   GFR calc non Af Amer >60 >60 mL/min   GFR calc Af Amer >60 >60 mL/min   Anion gap 12 5 - 15  CBC  Result Value Ref Range   WBC 12.2 (H) 4.0 - 10.5 K/uL   RBC 4.61 4.22 - 5.81 MIL/uL   Hemoglobin 14.9  13.0 - 17.0 g/dL   HCT 43.3 39 - 52 %   MCV 93.9 80.0 - 100.0 fL   MCH 32.3 26.0 - 34.0 pg   MCHC 34.4 30.0 - 36.0 g/dL   RDW 12.6 11.5 - 15.5 %   Platelets 201 150 - 400 K/uL   nRBC 0.0 0.0 - 0.2 %  D-dimer, quantitative  Result Value Ref Range   D-Dimer, Quant 0.63 (H) 0.00 - 0.50 ug/mL-FEU  Brain natriuretic peptide  Result Value Ref Range   B Natriuretic Peptide 42.4 0.0 - 100.0 pg/mL  Troponin I (High Sensitivity)  Result Value Ref Range   Troponin I (High Sensitivity) 4 <18 ng/L  Troponin I (High Sensitivity)  Result Value Ref Range   Troponin I (High Sensitivity) 4 <18 ng/L   DG Chest 2 View  Result Date: 01/22/2020 CLINICAL DATA:  Chest pain. EXAM: CHEST - 2 VIEW COMPARISON:  Demondre Aguas 30, 2021. FINDINGS: The heart size and mediastinal contours are within normal limits. Both lungs are clear. No pneumothorax or pleural effusion is noted. The visualized skeletal structures are unremarkable. IMPRESSION: No active cardiopulmonary disease. Electronically Signed   By: Marijo Conception M.D.   On: 01/22/2020 11:24   CT Angio Chest PE W and/or Wo Contrast  Result Date: 01/22/2020 CLINICAL DATA:  Chest pain. EXAM: CT ANGIOGRAPHY CHEST WITH CONTRAST TECHNIQUE: Multidetector CT imaging of the chest was performed using the standard protocol during bolus administration of intravenous contrast. Multiplanar CT image reconstructions and MIPs were obtained to evaluate the vascular anatomy. CONTRAST:  102mL OMNIPAQUE IOHEXOL 350 MG/ML SOLN COMPARISON:  April 13, 2012. FINDINGS: Cardiovascular: Satisfactory opacification of the pulmonary arteries to the segmental level. No evidence of pulmonary embolism. Normal heart size. No pericardial effusion. Atherosclerosis of thoracic aorta is noted without aneurysm formation. Mediastinum/Nodes: No  enlarged mediastinal, hilar, or axillary lymph nodes. Thyroid gland, trachea, and esophagus demonstrate no significant findings. Lungs/Pleura: No pneumothorax or pleural  effusion is noted. Emphysematous disease is noted in both upper lobes. Wall thickening of bilateral lower lobe bronchi is noted concerning for chronic inflammation. Upper Abdomen: No acute abnormality. Musculoskeletal: No chest wall abnormality. No acute or significant osseous findings. Review of the MIP images confirms the above findings. IMPRESSION: 1. No definite evidence of pulmonary embolus. 2. Wall thickening of bilateral lower lobe bronchi is noted concerning for chronic inflammation. Aortic Atherosclerosis (ICD10-I70.0) and Emphysema (ICD10-J43.9). Electronically Signed   By: Marijo Conception M.D.   On: 01/22/2020 15:01   DG Chest Portable 1 View  Result Date: 01/23/2020 CLINICAL DATA:  Chest pain and tightness, COPD, emphysema EXAM: PORTABLE CHEST 1 VIEW COMPARISON:  01/22/2020 FINDINGS: Single frontal view of the chest demonstrates a stable cardiac silhouette. Extensive upper lobe predominant background emphysema is again noted, unchanged. Perihilar bronchovascular prominence consistent with bronchial wall thickening on previous CT. No airspace disease, effusion, or pneumothorax. IMPRESSION: 1. Persistent bronchial wall thickening consistent with chronic bronchitis. 2. Stable emphysema. 3. No acute airspace disease. Electronically Signed   By: Randa Ngo M.D.   On: 01/23/2020 00:27    EKG  EKG Interpretation  Date/Time:  Thursday January 23 2020 00:26:56 EDT Ventricular Rate:  113 PR Interval:    QRS Duration: 107 QT Interval:  341 QTC Calculation: 468 R Axis:   81 Text Interpretation: Sinus tachycardia Biatrial enlargement Confirmed by Dory Horn) on 01/23/2020 12:35:34 AM       Radiology DG Chest 2 View  Result Date: 01/22/2020 CLINICAL DATA:  Chest pain. EXAM: CHEST - 2 VIEW COMPARISON:  Fariha Goto 30, 2021. FINDINGS: The heart size and mediastinal contours are within normal limits. Both lungs are clear. No pneumothorax or pleural effusion is noted. The visualized skeletal  structures are unremarkable. IMPRESSION: No active cardiopulmonary disease. Electronically Signed   By: Marijo Conception M.D.   On: 01/22/2020 11:24   CT Angio Chest PE W and/or Wo Contrast  Result Date: 01/22/2020 CLINICAL DATA:  Chest pain. EXAM: CT ANGIOGRAPHY CHEST WITH CONTRAST TECHNIQUE: Multidetector CT imaging of the chest was performed using the standard protocol during bolus administration of intravenous contrast. Multiplanar CT image reconstructions and MIPs were obtained to evaluate the vascular anatomy. CONTRAST:  28mL OMNIPAQUE IOHEXOL 350 MG/ML SOLN COMPARISON:  April 13, 2012. FINDINGS: Cardiovascular: Satisfactory opacification of the pulmonary arteries to the segmental level. No evidence of pulmonary embolism. Normal heart size. No pericardial effusion. Atherosclerosis of thoracic aorta is noted without aneurysm formation. Mediastinum/Nodes: No enlarged mediastinal, hilar, or axillary lymph nodes. Thyroid gland, trachea, and esophagus demonstrate no significant findings. Lungs/Pleura: No pneumothorax or pleural effusion is noted. Emphysematous disease is noted in both upper lobes. Wall thickening of bilateral lower lobe bronchi is noted concerning for chronic inflammation. Upper Abdomen: No acute abnormality. Musculoskeletal: No chest wall abnormality. No acute or significant osseous findings. Review of the MIP images confirms the above findings. IMPRESSION: 1. No definite evidence of pulmonary embolus. 2. Wall thickening of bilateral lower lobe bronchi is noted concerning for chronic inflammation. Aortic Atherosclerosis (ICD10-I70.0) and Emphysema (ICD10-J43.9). Electronically Signed   By: Marijo Conception M.D.   On: 01/22/2020 15:01   DG Chest Portable 1 View  Result Date: 01/23/2020 CLINICAL DATA:  Chest pain and tightness, COPD, emphysema EXAM: PORTABLE CHEST 1 VIEW COMPARISON:  01/22/2020 FINDINGS: Single frontal view of the chest demonstrates  a stable cardiac silhouette. Extensive  upper lobe predominant background emphysema is again noted, unchanged. Perihilar bronchovascular prominence consistent with bronchial wall thickening on previous CT. No airspace disease, effusion, or pneumothorax. IMPRESSION: 1. Persistent bronchial wall thickening consistent with chronic bronchitis. 2. Stable emphysema. 3. No acute airspace disease. Electronically Signed   By: Randa Ngo M.D.   On: 01/23/2020 00:27    Procedures Procedures (including critical care time)  Medications Ordered in ED Medications  doxycycline (VIBRAMYCIN) 100 mg in sodium chloride 0.9 % 250 mL IVPB (100 mg Intravenous New Bag/Given 01/23/20 0122)  albuterol (PROVENTIL,VENTOLIN) solution continuous neb (has no administration in time range)  albuterol (VENTOLIN HFA) 108 (90 Base) MCG/ACT inhaler 8 puff (8 puffs Inhalation Given 01/23/20 0041)  ipratropium (ATROVENT HFA) inhaler 4 puff (4 puffs Inhalation Given 01/23/20 0043)    ED Course  I have reviewed the triage vital signs and the nursing notes.  Pertinent labs & imaging results that were available during my care of the patient were reviewed by me and considered in my medical decision making (see chart for details).    covid is negative, will start a continuous neb  MDM Reviewed: previous chart, nursing note and vitals Reviewed previous: CT scan, x-ray and labs Interpretation: labs, ECG and x-ray (negative covid and cardiac labs ) Total time providing critical care: 30-74 minutes (continuous nebs). This excludes time spent performing separately reportable procedures and services. Consults: admitting MD  CRITICAL CARE Performed by: Safir Michalec K Arslan Kier-Rasch Total critical care time: 60 minutes Critical care time was exclusive of separately billable procedures and treating other patients. Critical care was necessary to treat or prevent imminent or life-threatening deterioration. Critical care was time spent personally by me on the following activities:  development of treatment plan with patient and/or surrogate as well as nursing, discussions with consultants, evaluation of patient's response to treatment, examination of patient, obtaining history from patient or surrogate, ordering and performing treatments and interventions, ordering and review of laboratory studies, ordering and review of radiographic studies, pulse oximetry and re-evaluation of patient's condition.  Final Clinical Impression(s) / ED Diagnoses Final diagnoses:  COPD exacerbation ([place])    Admit to medicine, failure of outpatient management    Rosser Collington, MD 01/23/20 0126

## 2020-01-23 NOTE — ED Triage Notes (Signed)
Pt from home is on home O2 2L N/C called EMS d/t SOB found tripoding my EMS with severe SOB . IV established 16ga left forearm. IV Mag 2Gm,  Solumedrol 125 mg 5 albuteral and 0.5 Atrovent given per EMS prior to arrival.

## 2020-01-23 NOTE — Evaluation (Signed)
Occupational Therapy Evaluation Patient Details Name: Duane Cuevas MRN: [id number] DOB: Mar 13, 1956 Today's Date: 01/23/2020    History of Present Illness Pt is 64 yo male with PMH of DM2, COPD, hepatic asteatosis, thrombocytonpnia, CAD, MI, and hyperlipidemia.  Pt admitted with COPD exacerbation.   Clinical Impression   [name] demonstrates normal ROM and strength of upper extremities, ability to perform independent bed mobility, ambulate in hall without a device and ability to perform ADLs including lower body dressing independently. Patient only limited by dyspnea and elevated HR with ambulation but without physical deficits. No OT needs.     Follow Up Recommendations  No OT follow up    Equipment Recommendations  None recommended by OT    Recommendations for Other Services       Precautions / Restrictions Precautions Precautions: None Precaution Comments: Monitor HR and sats      Mobility Bed Mobility Overal bed mobility: Independent                Transfers Overall transfer level: Independent                    Balance Overall balance assessment: Independent                                         ADL either performed or assessed with clinical judgement   ADL Overall ADL's : Modified independent                                       General ADL Comments: increased time - limited by coughing spells     Vision   Vision Assessment?: No apparent visual deficits     Perception     Praxis      Pertinent Vitals/Pain Pain Assessment: Faces Faces Pain Scale: Hurts little more Pain Location: chest when he coughs Pain Intervention(s): Monitored during session     Hand Dominance     Extremity/Trunk Assessment Upper Extremity Assessment Upper Extremity Assessment: Overall WFL for tasks assessed   Lower Extremity Assessment Lower Extremity Assessment: Defer to PT evaluation   Cervical / Trunk  Assessment Cervical / Trunk Assessment: Normal   Communication Communication Communication: Prefers language other than English   Cognition Arousal/Alertness: Awake/alert Behavior During Therapy: WFL for tasks assessed/performed Overall Cognitive Status: Within Functional Limits for tasks assessed                                     General Comments  Pt's HR 120 bpm rest up to 145 bpm with gait; on 2 LPM O2 with sats 99% rest, 92% walking    Exercises     Shoulder Instructions      Home Living Family/patient expects to be discharged to:: Private residence Living Arrangements: Spouse/significant other;Children Available Help at Discharge: Family;Available 24 hours/day Type of Home: House Home Access: Level entry     Home Layout: Two level Alternate Level Stairs-Number of Steps: 9 Alternate Level Stairs-Rails: Right;Left     Bathroom Toilet: Standard     Home Equipment: None          Prior Functioning/Environment Level of Independence: Independent  Gait / Transfers Assistance Needed: Independent with community ambulation no AD ADL's /  Homemaking Assistance Needed: Independent with ADLs and IADLs   Comments: Does have 2 LPM Home O2 but only wears as needed        OT Problem List:        OT Treatment/Interventions:      OT Goals(Current goals can be found in the care plan section) Acute Rehab OT Goals Patient Stated Goal: breathe better OT Goal Formulation: All assessment and education complete, DC therapy  OT Frequency:     Barriers to D/C:            Co-evaluation              AM-PAC OT "6 Clicks" Daily Activity     Outcome Measure Help from another person eating meals?: None Help from another person taking care of personal grooming?: None Help from another person toileting, which includes using toliet, bedpan, or urinal?: None Help from another person bathing (including washing, rinsing, drying)?: None Help from another  person to put on and taking off regular upper body clothing?: None Help from another person to put on and taking off regular lower body clothing?: None 6 Click Score: 24   End of Session Equipment Utilized During Treatment: Gait belt Nurse Communication: Mobility status  Activity Tolerance: Patient tolerated treatment well Patient left: in bed;with call bell/phone within reach;with family/visitor present  OT Visit Diagnosis: Muscle weakness (generalized) (M62.81)                Time: 0037-0488 OT Time Calculation (min): 23 min Charges:  OT General Charges $OT Visit: 1 Visit OT Evaluation $OT Eval Low Complexity: 1 Low  Mehkai Gallo, OTR/L [place]  Office [phone] Pager: [phone]   Lenward Chancellor 01/23/2020, 6:01 PM

## 2020-01-23 NOTE — ED Notes (Signed)
RT has evaluated pt. And taken him off of his bi-pap. He is breathing normally and has eaten his breakfast. He has no requests at this time.

## 2020-01-23 NOTE — Progress Notes (Addendum)
Pt seen and given scheduled nebulizer treatments which he tolerated well.  No increased wob/respiratory distress noted or voiced by pt at this time.  Pt found on 2lnc, HR 110, rr19, spo2 96%.  Bipap not indicated at this time.  RT will monitor and assess as needed.

## 2020-01-24 LAB — GLUCOSE, CAPILLARY
Glucose-Capillary: 207 mg/dL — ABNORMAL HIGH (ref 70–99)
Glucose-Capillary: 173 mg/dL — ABNORMAL HIGH (ref 70–99)
Glucose-Capillary: 151 mg/dL — ABNORMAL HIGH (ref 70–99)
Glucose-Capillary: 247 mg/dL — ABNORMAL HIGH (ref 70–99)
Glucose-Capillary: 302 mg/dL — ABNORMAL HIGH (ref 70–99)
Glucose-Capillary: 278 mg/dL — ABNORMAL HIGH (ref 70–99)

## 2020-01-24 LAB — CBC WITH DIFFERENTIAL/PLATELET
Abs Immature Granulocytes: 0.1 10*3/uL — ABNORMAL HIGH (ref 0.00–0.07)
Basophils Absolute: 0 10*3/uL (ref 0.0–0.1)
Basophils Relative: 0 %
Eosinophils Absolute: 0 10*3/uL (ref 0.0–0.5)
Eosinophils Relative: 0 %
HCT: 41.1 % (ref 39.0–52.0)
Hemoglobin: 13.7 g/dL (ref 13.0–17.0)
Immature Granulocytes: 1 %
Lymphocytes Relative: 3 %
Lymphs Abs: 0.5 10*3/uL — ABNORMAL LOW (ref 0.7–4.0)
MCH: 32.3 pg (ref 26.0–34.0)
MCHC: 33.3 g/dL (ref 30.0–36.0)
MCV: 96.9 fL (ref 80.0–100.0)
Monocytes Absolute: 0.6 10*3/uL (ref 0.1–1.0)
Monocytes Relative: 3 %
Neutro Abs: 15.7 10*3/uL — ABNORMAL HIGH (ref 1.7–7.7)
Neutrophils Relative %: 93 %
Platelets: 222 10*3/uL (ref 150–400)
RBC: 4.24 MIL/uL (ref 4.22–5.81)
RDW: 13 % (ref 11.5–15.5)
WBC: 16.8 10*3/uL — ABNORMAL HIGH (ref 4.0–10.5)
nRBC: 0 % (ref 0.0–0.2)

## 2020-01-24 LAB — PROCALCITONIN: Procalcitonin: <0.1 ng/mL

## 2020-01-24 MED ORDER — METHYLPREDNISOLONE SODIUM SUCC 125 MG IJ SOLR
60.0000 mg | Freq: Two times a day (BID) | INTRAMUSCULAR | Status: DC
  Administered 2020-01-24 – 2020-01-26 (×4): 60 mg via INTRAVENOUS
  Filled 2020-01-24 (×4): qty 2

## 2020-01-24 MED ORDER — ORAL CARE MOUTH RINSE
15.0000 mL | Freq: Two times a day (BID) | OROMUCOSAL | Status: DC
  Administered 2020-01-24 – 2020-01-26 (×6): 15 mL via OROMUCOSAL

## 2020-01-24 MED ORDER — ENOXAPARIN SODIUM 40 MG/0.4ML ~~LOC~~ SOLN
40.0000 mg | SUBCUTANEOUS | Status: DC
  Administered 2020-01-24 – 2020-01-25 (×2): 40 mg via SUBCUTANEOUS
  Filled 2020-01-24 (×2): qty 0.4

## 2020-01-24 MED ORDER — BUDESONIDE 0.5 MG/2ML IN SUSP
0.5000 mg | Freq: Two times a day (BID) | RESPIRATORY_TRACT | Status: DC
  Administered 2020-01-24 – 2020-01-26 (×5): 0.5 mg via RESPIRATORY_TRACT
  Filled 2020-01-24 (×5): qty 2

## 2020-01-24 NOTE — Progress Notes (Signed)
PROGRESS NOTE    Patient: Duane Cuevas                            PCP: [name], MD                    DOB: May 23, 1956            DOA: 01/23/2020 [id number]             DOS: 01/24/2020, 2:06 PM   LOS: 1 day   Date of Service: The patient was seen and examined on 01/24/2020  Subjective:   The patient was seen and examined this morning, off BiPAP, on O2 via nasal cannula. Still visibly has shortness of breath, but improved from yesterday. Off BiPAP, has been tapered down to 2 L by nasal cannula, currently satting 93% Continue to have cough, worsening shortness of breath with exertion   Brief Narrative:   Duane Cuevas is a 64 y.o. male with medical history significant of type 2 diabetes, emphysema/COPD, hepatic asteatosis, thrombocytopenia, CAD, history of MI, hyperlipidemia who is returning to the emergency department after being seen yesterday morning due to COPD exacerbation with recurrence/worsening of his symptoms.     CTA and chest radiograph showed wall thickening of bilateral lower lobe bronchi likely due to chronic inflammation  SARS coronavirus 2 was negative.   Assessment & Plan:   Principal Problem:   Acute respiratory failure with hypoxia (HCC) Active Problems:   COPD (chronic obstructive pulmonary disease) (HCC)   Type 2 diabetes mellitus with hyperglycemia, without long-term current use of insulin (HCC)   Hyperlipidemia   CAD (coronary artery disease)   COPD exacerbation (HCC)     Acute respiratory failure with hypoxia (HCC)  COPD (chronic obstructive pulmonary disease) ([place]) -Has been tapered off BiPAP, O2 demand has been tapered down to 2 L, currently satting 92% -Continue BiPAP as needed -Continue IV Solu-Medrol with echo taper -We will continue pulmonary toiletry -We will continue DuoNeb bronchodilators every 6 hours and scheduled (Switch to Xopenex/Atrovent, Pulmicort, Mucomyst.  Flutter and incentive spirometer) -Continuing  empiric antibiotics, mucolytic's and toxins  Continue supplemental oxygen to maintain O2 sat 88-92%   Type 2 diabetes mellitus with hyperglycemia,   without long-term current use of insulin (HCC) -Hyperglycemia due to steroids -We will check blood sugar QA CHS with SSI coverage, Lantus initiated -Resuming home medication of glipizide 5 mg daily -Holding home medication of Metformin (500 mg p.o. 3 times daily) Continue 0.45% NaCl at 100 mL/h --will be DC'd     Hyperlipidemia No longer taking atorvastatin.    CAD (coronary artery disease) Not on statin or beta-blocker. Resume daily aspirin.    DVT prophylaxis:      Lovenox SQ. Code Status:              Full code. Family Communication:     Discussed with patient    Disposition Plan:              Patient is from:                        Home.             Anticipated DC to:                   Home.             Anticipated DC date:  01/25/2020.             Anticipated DC barriers:         Clinical improvement            Consults called: none at this time Admission status:     Stepdown/observation.   Severity of Illness: Very high... Remain inpatient for continuous respiratory failure.  Cultures; Blood Cultures x 2 >> NGT Urine Culture  >>> NGT  Sputum Culture >> NGT   Antimicrobials:    Procedures:   No admission procedures for hospital encounter.     Antimicrobials:  Anti-infectives (From admission, onward)   Start     Dose/Rate Route Frequency Ordered Stop   01/23/20 1000  levofloxacin (LEVAQUIN) tablet 500 mg     Discontinue     500 mg Oral Daily 01/23/20 0809 01/28/20 0959   01/23/20 0030  doxycycline (VIBRAMYCIN) 100 mg in sodium chloride 0.9 % 250 mL IVPB        100 mg 125 mL/hr over 120 Minutes Intravenous  Once 01/23/20 0024 01/23/20 0330       Medication:   acetylcysteine  3 mL Nebulization BID   aspirin EC  81 mg Oral Daily   budesonide (PULMICORT) nebulizer solution  0.5 mg  Nebulization BID   enoxaparin (LOVENOX) injection  40 mg Subcutaneous Q24H   glipiZIDE  5 mg Oral Q breakfast   guaiFENesin-dextromethorphan  10 mL Oral Q6H   insulin aspart  0-20 Units Subcutaneous Q4H   insulin glargine  10 Units Subcutaneous Daily   ipratropium  0.5 mg Nebulization Q6H   levalbuterol  1.25 mg Nebulization Q6H   levofloxacin  500 mg Oral Daily   mouth rinse  15 mL Mouth Rinse BID   methylPREDNISolone (SOLU-MEDROL) injection  60 mg Intravenous Q6H    acetaminophen **OR** acetaminophen, albuterol, chlorpheniramine-HYDROcodone, ondansetron **OR** ondansetron (ZOFRAN) IV   Objective:   Vitals:   01/24/20 0922 01/24/20 1012 01/24/20 1053 01/24/20 1303  BP:  (!) 138/80  115/76  Pulse:  (!) 115  105  Resp:  18 20 20   Temp:  98.3 F (36.8 C)  98.3 F (36.8 C)  TempSrc:  Oral  Oral  SpO2: 96% 94%  98%  Weight:      Height:        Intake/Output Summary (Last 24 hours) at 01/24/2020 1406 Last data filed at 01/24/2020 0300 Gross per 24 hour  Intake --  Output 1100 ml  Net -1100 ml   Filed Weights   01/23/20 2302  Weight: 73.8 kg     Examination:       Physical Exam:   General:  Alert, oriented, cooperative, no distress; wife at bedside  HEENT:  Normocephalic, PERRL, otherwise with in Normal limits   Neuro:  CNII-XII intact. , normal motor and sensation, reflexes intact   Lungs:    Diffuse wheezing, rhonchi, positive breath sounds throughout lung fields,  Cardio:    S1/S2, RRR, No murmure, No Rubs or Gallops   Abdomen:   Soft, non-tender, bowel sounds active all four quadrants,  no guarding or peritoneal signs.  Muscular skeletal:  Limited exam - in bed, able to move all 4 extremities, Normal strength,  2+ pulses,  symmetric, No pitting edema  Skin:  Dry, warm to touch, negative for any Rashes, No open wounds   Wounds: per nursing documentation          ------------------------------------------------------------------------------------------------------------------------------------------    LABs:  CBC Latest Ref Rng & Units 01/24/2020 01/23/2020  01/23/2020  WBC 4.0 - 10.5 K/uL 16.8(H) 8.9 -  Hemoglobin 13.0 - 17.0 g/dL 13.7 14.4 14.3  Hematocrit 39 - 52 % 41.1 43.0 42.0  Platelets 150 - 400 K/uL 222 210 -   CMP Latest Ref Rng & Units 01/23/2020 01/23/2020 01/22/2020  Glucose 70 - 99 mg/dL 324(H) 356(H) 266(H)  BUN 8 - 23 mg/dL 22 28(H) 15  Creatinine 0.61 - 1.24 mg/dL 1.06 0.80 0.89  Sodium 135 - 145 mmol/L 139 137 139  Potassium 3.5 - 5.1 mmol/L 4.0 5.1 3.9  Chloride 98 - 111 mmol/L 104 104 102  CO2 22 - 32 mmol/L 20(L) - 25  Calcium 8.9 - 10.3 mg/dL 9.5 - 9.3  Total Protein 6.5 - 8.1 g/dL 7.5 - -  Total Bilirubin 0.3 - 1.2 mg/dL 0.7 - -  Alkaline Phos 38 - 126 U/L 67 - -  AST 15 - 41 U/L 17 - -  ALT 0 - 44 U/L 20 - -       Micro Results Recent Results (from the past 240 hour(s))  SARS Coronavirus 2 by RT PCR (hospital order, performed in [facility] hospital lab) Nasopharyngeal Nasopharyngeal Swab     Status: None   Collection Time: 01/22/20 12:19 PM   Specimen: Nasopharyngeal Swab  Result Value Ref Range Status   SARS Coronavirus 2 NEGATIVE NEGATIVE Final    Comment: (NOTE) SAPerformed at [facility], [place] [address] [place], [place] [address]   SARS Coronavirus 2 by RT PCR (hospital order, performed in [facility] hospital lab) Nasopharyngeal Nasopharyngeal Swab     Status: None   Collection Time: 01/23/20 12:43 AM   Specimen: Nasopharyngeal Swab  Result Value Ref Range Status   SARS Coronavirus 2 NEGATIVE NEGATIVE Final    Comment: (NOTE) SARS-CoV-2 target nucleic acids are NOT DETECTED.  The SARS-CoV-2 RNA is generally detectable in upper and lower respiratory specimens during the acute phase of infection. The lowest concentration of SARS-CoV-2 viral copies this assay can detect is  250 copies / mL. A negative result does not preclude SARS-CoV-2 infePerformed at [place] [address], [place], [place] [address]     Radiology Reports DG Chest 2 View  Result Date: 01/22/2020 CLINICAL DATA:  Chest pain. EXAM: CHEST - 2 VIEW COMPARISON:  November 01, 2019. FINDINGS: The heart size and mediastinal contours are within normal limits. Both lungs are clear. No pneumothorax or pleural effusion is noted. The visualized skeletal structures are unremarkable. IMPRESSION: No active cardiopulmonary disease. Electronically Signed   By: Marijo Conception M.D.   On: 01/22/2020 11:24   CT Angio Chest PE W and/or Wo Contrast  Result Date: 01/22/2020 CLINICAL DATA:  Chest pain. EXAM: CT ANGIOGRAPHY CHEST WITH CONTRAST TECHNIQUE: Multidetector CT imaging of the chest was performed using the standard protocol during bolus administration of intravenous contrast. Multiplanar CT image reconstructions and MIPs were obtained to evaluate the vascular anatomy. CONTRAST:  33mL OMNIPAQUE IOHEXOL 350 MG/ML SOLN COMPARISON:  April 13, 2012. FINDINGS: Cardiovascular: Satisfactory opacification of the pulmonary arteries to the segmental level. No evidence of pulmonary embolism. Normal heart size. No pericardial effusion. Atherosclerosis of thoracic aorta is noted without aneurysm formation. Mediastinum/Nodes: No enlarged mediastinal, hilar, or axillary lymph nodes. Thyroid gland, trachea, and esophagus demonstrate no significant findings. Lungs/Pleura: No pneumothorax or pleural effusion is noted. Emphysematous disease is noted in both upper lobes. Wall thickening of bilateral lower lobe bronchi is noted concerning for chronic inflammation. Upper Abdomen: No acute  abnormality. Musculoskeletal: No chest wall abnormality. No acute or significant osseous findings. Review of the MIP images confirms the above findings. IMPRESSION: 1. No definite evidence of pulmonary embolus. 2. Wall thickening of  bilateral lower lobe bronchi is noted concerning for chronic inflammation. Aortic Atherosclerosis (ICD10-I70.0) and Emphysema (ICD10-J43.9). Electronically Signed   By: Marijo Conception M.D.   On: 01/22/2020 15:01   DG Chest Portable 1 View  Result Date: 01/23/2020 CLINICAL DATA:  Chest pain and tightness, COPD, emphysema EXAM: PORTABLE CHEST 1 VIEW COMPARISON:  01/22/2020 FINDINGS: Single frontal view of the chest demonstrates a stable cardiac silhouette. Extensive upper lobe predominant background emphysema is again noted, unchanged. Perihilar bronchovascular prominence consistent with bronchial wall thickening on previous CT. No airspace disease, effusion, or pneumothorax. IMPRESSION: 1. Persistent bronchial wall thickening consistent with chronic bronchitis. 2. Stable emphysema. 3. No acute airspace disease. Electronically Signed   By: Randa Ngo M.D.   On: 01/23/2020 00:27    SIGNED: Deatra James, MD, FACP, FHM. [facility],  Pager (please use [email or web address] to page/text)  If 7PM-7AM, please contact night-coverage [email or web address], Password [facility] 01/24/2020, 2:06 PM

## 2020-01-24 NOTE — Progress Notes (Signed)
Nutrition Brief Note  RD consulted for nutritional assessment via COPD protocol.  Pt with no weight loss, no reported poor appetite and pt is eating meals.   Wt Readings from Last 15 Encounters:  01/23/20 73.8 kg  11/25/19 72.8 kg  11/02/19 69.5 kg  08/26/19 66.3 kg  02/20/19 70.8 kg  09/16/18 70.8 kg  08/14/18 68.5 kg  06/30/18 65.8 kg  05/14/18 67.9 kg  05/10/18 65.8 kg  04/30/18 68.3 kg  03/16/18 65.8 kg  09/23/15 66.4 kg  09/20/15 67 kg  04/11/15 74.8 kg    Body mass index is 24.74 kg/m. Patient meets criteria for normal based on current BMI.   Current diet order is HH/CHO modified.  Labs and medications reviewed.   No nutrition interventions warranted at this time. If nutrition issues arise, please re-consult RD.   Duane Bibles, MS, RD, LDN Inpatient Clinical Dietitian Contact information available via Amion

## 2020-01-25 LAB — CBC WITH DIFFERENTIAL/PLATELET
Abs Immature Granulocytes: 0.15 10*3/uL — ABNORMAL HIGH (ref 0.00–0.07)
Basophils Absolute: 0 10*3/uL (ref 0.0–0.1)
Basophils Relative: 0 %
Eosinophils Absolute: 0 10*3/uL (ref 0.0–0.5)
Eosinophils Relative: 0 %
HCT: 41.7 % (ref 39.0–52.0)
Hemoglobin: 13.9 g/dL (ref 13.0–17.0)
Immature Granulocytes: 1 %
Lymphocytes Relative: 2 %
Lymphs Abs: 0.4 10*3/uL — ABNORMAL LOW (ref 0.7–4.0)
MCH: 32 pg (ref 26.0–34.0)
MCHC: 33.3 g/dL (ref 30.0–36.0)
MCV: 95.9 fL (ref 80.0–100.0)
Monocytes Absolute: 0.4 10*3/uL (ref 0.1–1.0)
Monocytes Relative: 2 %
Neutro Abs: 17.5 10*3/uL — ABNORMAL HIGH (ref 1.7–7.7)
Neutrophils Relative %: 95 %
Platelets: 245 10*3/uL (ref 150–400)
RBC: 4.35 MIL/uL (ref 4.22–5.81)
RDW: 12.7 % (ref 11.5–15.5)
WBC: 18.5 10*3/uL — ABNORMAL HIGH (ref 4.0–10.5)
nRBC: 0 % (ref 0.0–0.2)

## 2020-01-25 LAB — GLUCOSE, CAPILLARY
Glucose-Capillary: 173 mg/dL — ABNORMAL HIGH (ref 70–99)
Glucose-Capillary: 196 mg/dL — ABNORMAL HIGH (ref 70–99)
Glucose-Capillary: 217 mg/dL — ABNORMAL HIGH (ref 70–99)
Glucose-Capillary: 250 mg/dL — ABNORMAL HIGH (ref 70–99)
Glucose-Capillary: 264 mg/dL — ABNORMAL HIGH (ref 70–99)
Glucose-Capillary: 390 mg/dL — ABNORMAL HIGH (ref 70–99)

## 2020-01-25 LAB — PROCALCITONIN: Procalcitonin: <0.1 ng/mL

## 2020-01-25 NOTE — Progress Notes (Signed)
PROGRESS NOTE    Duane Cuevas  VZC:[id number] DOB: 1955-07-29 DOA: 01/23/2020 PCP: [name], MD   Brief Narrative:  Duane Cuevas a 64 y.o.malewith medical history significant oftype 2 diabetes, emphysema/COPD, hepatic asteatosis, thrombocytopenia, CAD, history of MI, hyperlipidemia who is returningto the emergency departmentafter being seen yesterday morning due to COPD exacerbation with recurrence/worsening of his symptoms. CTA and chest radiograph showed wall thickening of bilateral lower lobe bronchi likely due to chronic inflammation. SARS coronavirus 2 was negative    Assessment & Plan:   Principal Problem:   Acute respiratory failure with hypoxia (HCC) Active Problems:   COPD (chronic obstructive pulmonary disease) (HCC)   Type 2 diabetes mellitus with hyperglycemia, without long-term current use of insulin (HCC)   Hyperlipidemia   CAD (coronary artery disease)   COPD exacerbation (HCC)   Acute respiratory failure with hypoxia (HCC) COPD (chronic obstructive pulmonary disease) ([place]) -Has been tapered off BiPAP, O2 demand has been tapered down to 2 L, currently satting 92% -Continue BiPAP as needed -Continue IV Solu-Medrol with echo taper -We will continue pulmonary toiletry -We will continue DuoNeb bronchodilators every 6 hours and scheduled (Switch to Xopenex/Atrovent, Pulmicort, Mucomyst.  Flutter and incentive spirometer) -Continuing empiric antibiotics, mucolytic's and toxins -Continue supplemental oxygen to maintain O2 sat 88-92%   Type 2 diabetes mellitus with hyperglycemia,  without long-term current use of insulin (HCC) -Hyperglycemia due to steroids. -We will check blood sugar QA CHS with SSI coverage, Lantus initiated -Resuming home medication of glipizide 5 mg daily -Holding home medication of Metformin (500 mg p.o. 3 times daily) Continue 0.45% NaCl at 100 mL/h --will be DC'd   Hyperlipidemia No longer taking  atorvastatin.  CAD (coronary artery disease) Not on statin or beta-blocker. Resume daily aspirin.   DVT prophylaxis:  Lovenox SQ Code Status: Full Family Communication: Discussed with patient in detail Disposition Plan: Patient is from:Home. Anticipated DC JO:INOM. Anticipated DC date:01/26/2020. Anticipated DC barriers:Clinical improvement  Consultants:   None.  Procedures: None.  Antimicrobials: Anti-infectives (From admission, onward)   Start     Dose/Rate Route Frequency Ordered Stop   01/23/20 1000  levofloxacin (LEVAQUIN) tablet 500 mg     Discontinue     500 mg Oral Daily 01/23/20 0809 01/28/20 0959   01/23/20 0030  doxycycline (VIBRAMYCIN) 100 mg in sodium chloride 0.9 % 250 mL IVPB        100 mg 125 mL/hr over 120 Minutes Intravenous  Once 01/23/20 0024 01/23/20 0330      Subjective: Patient was seen and examined at bedside.  He is off BiPAP,  He is on 2 L supplemental oxygen via nasal cannula saturating 93%.  He continues to have cough wheezing and shortness of breath with exertion.  Overnight events.  Objective: Vitals:   01/25/20 0150 01/25/20 0431 01/25/20 0620 01/25/20 0906  BP:  (!) 135/102 107/72   Pulse:  100 77   Resp:  22    Temp:  97.7 F (36.5 C)    TempSrc:  Oral    SpO2: 100% 99%  97%  Weight:      Height:       No intake or output data in the 24 hours ending 01/25/20 1448 Filed Weights   01/23/20 2302  Weight: 73.8 kg    Examination:  General exam: Appears calm and comfortable  Respiratory system: Significant wheezing bilaterally, good air entry. Cardiovascular system: S1 & S2 heard, RRR. No JVD, murmurs, rubs, gallops or clicks. No pedal edema. Gastrointestinal system:  Abdomen is nondistended, soft and nontender. No organomegaly or masses felt. Normal bowel sounds heard. Central nervous system: Alert and oriented. No  focal neurological deficits. Extremities: Symmetric 5 x 5 power. Skin: No rashes, lesions or ulcers Psychiatry: Judgement and insight appear normal. Mood & affect appropriate.     Data Reviewed: I have personally reviewed following labs and imaging studies  CBC: Recent Labs  Lab 01/22/20 1058 01/22/20 1058 01/23/20 0043 01/23/20 0050 01/23/20 0442 01/24/20 0422 01/25/20 0459  WBC 12.2*  --  9.6  --  8.9 16.8* 18.5*  NEUTROABS  --   --  9.1*  --  8.7* 15.7* 17.5*  HGB 14.9   < > 13.9 14.3 14.4 13.7 13.9  HCT 43.3   < > 41.6 42.0 43.0 41.1 41.7  MCV 93.9  --  96.7  --  96.6 96.9 95.9  PLT 201  --  200  --  210 222 245   < > = values in this interval not displayed.   Basic Metabolic Panel: Recent Labs  Lab 01/22/20 1058 01/23/20 0050 01/23/20 0532  NA 139 137 139  K 3.9 5.1 4.0  CL 102 104 104  CO2 25  --  20*  GLUCOSE 266* 356* 324*  BUN 15 28* 22  CREATININE 0.89 0.80 1.06  CALCIUM 9.3  --  9.5   GFR: Estimated Creatinine Clearance: 68.1 mL/min (by C-G formula based on SCr of 1.06 mg/dL). Liver Function Tests: Recent Labs  Lab 01/23/20 0532  AST 17  ALT 20  ALKPHOS 67  BILITOT 0.7  PROT 7.5  ALBUMIN 4.0   No results for input(s): LIPASE, AMYLASE in the last 168 hours. No results for input(s): AMMONIA in the last 168 hours. Coagulation Profile: No results for input(s): INR, PROTIME in the last 168 hours. Cardiac Enzymes: No results for input(s): CKTOTAL, CKMB, CKMBINDEX, TROPONINI in the last 168 hours. BNP (last 3 results) No results for input(s): PROBNP in the last 8760 hours. HbA1C: No results for input(s): HGBA1C in the last 72 hours. CBG: Recent Labs  Lab 01/24/20 1959 01/25/20 0011 01/25/20 0430 01/25/20 0740 01/25/20 1202  GLUCAP 278* 196* 217* 264* 173*   Lipid Profile: No results for input(s): CHOL, HDL, LDLCALC, TRIG, CHOLHDL, LDLDIRECT in the last 72 hours. Thyroid Function Tests: No results for input(s): TSH, T4TOTAL, FREET4,  T3FREE, THYROIDAB in the last 72 hours. Anemia Panel: No results for input(s): VITAMINB12, FOLATE, FERRITIN, TIBC, IRON, RETICCTPCT in the last 72 hours. Sepsis Labs: Recent Labs  Lab 01/23/20 0532 01/24/20 0422 01/25/20 0459  PROCALCITON <0.10 <0.10 <0.10    Recent Results (from the past 240 hour(s))  SARS Coronavirus 2 by RT PCR (hospital order, performed in [facility] hospital lab) Nasopharyngeal Nasopharyngeal Swab     Status: None   Collection Time: 01/22/20 12:19 PM   Specimen: Nasopharyngeal Swab  Result Value Ref Range Status   SARS Coronavirus 2 NEGATIVE NEGATIVE Final    Comment: (NOTE) SARS-CoV-2 target nucleic acids are NOT DETECTED.  The SARS-CoV-2 RNA is generally detectable in upper and lower respiratory specimens during the acute phase of infection. The lowest concentration of SARS-CoV-2 viral copies this assay can detect is 250 copies / mL. A negative result does not preclude SARS-CoV-2 infection and should not be used as the sole basis for treatment or other patient management decisions.  A negative result may occur with improper specimen collection / handling, submission of specimen other than nasopharyngeal swab, presence of viral mutation(s) within the  areas targeted by this assay, and inadequate number of viral copies (<250 copies / mL). A negative result must be combined with clinical observations, patient history, and epidemiological information.  Fact Sheet for Patients:   StrictlyIdeas.no  Fact Sheet for Healthcare Providers: BankingDealers.co.za  This test is not yet approved or  cleared by the [place] FDA and has been authorized for detection and/or diagnosis of SARS-CoV-2 by FDA under an Emergency Use Authorization (EUA).  This EUA will remain in effect (meaning this test can be used) for the duration of the COVID-19 declaration under Section 564(b)(1) of the Act, 21 U.S.C. section  360bbb-3(b)(1), unless the authorization is terminated or revoked sooner.  Performed at [facility], [place] [address], [place]  [address]   SARS Coronavirus 2 by RT PCR (hospital order, performed in [facility] hospital lab) Nasopharyngeal Nasopharyngeal Swab     Status: None   Collection Time: 01/23/20 12:43 AM   Specimen: Nasopharyngeal Swab  Result Value Ref Range Status   SARS Coronavirus 2 NEGATIVE NEGATIVE Final    Comment: (NOTE) SARS-CoV-2 target nucleic acids are NOT DETECTED.  The SARS-CoV-2 RNA is generally detectable in upper and lower respiratory specimens during the acute phase of infection. The lowest concentration of SARS-CoV-2 viral copies this assay can detect is 250 copies / mL. A negative result does not preclude SARS-CoV-2 infection and should not be used as the sole basis for treatment or other patient management decisions.  A negative result may occur with improper specimen collection / handling, submission of specimen other than nasopharyngeal swab, presence of viral mutation(s) within the areas targeted by this assay, and inadequate number of viral copies (<250 copies / mL). A negative result must be combined with clinical observations, patient history, and epidemiological information.  Fact Sheet for Patients:   StrictlyIdeas.no  Fact Sheet for Healthcare Providers: BankingDealers.co.za  This test is not yet approved or  cleared by the [place] FDA and has been authorized for detection and/or diagnosis of SARS-CoV-2 by FDA under an Emergency Use Authorization (EUA).  This EUA will remain in effect (meaning this test can be used) for the duration of the COVID-19 declaration under Section 564(b)(1) of the Act, 21 U.S.C. section 360bbb-3(b)(1), unless the authorization is terminated or revoked sooner.  Performed at [facility], [place] [address] [place], [place] [address]          Radiology Studies: No results found.      Scheduled Meds: . acetylcysteine  3 mL Nebulization BID  . aspirin EC  81 mg Oral Daily  . budesonide (PULMICORT) nebulizer solution  0.5 mg Nebulization BID  . enoxaparin (LOVENOX) injection  40 mg Subcutaneous Q24H  . glipiZIDE  5 mg Oral Q breakfast  . guaiFENesin-dextromethorphan  10 mL Oral Q6H  . insulin aspart  0-20 Units Subcutaneous Q4H  . insulin glargine  10 Units Subcutaneous Daily  . ipratropium  0.5 mg Nebulization Q6H  . levalbuterol  1.25 mg Nebulization Q6H  . levofloxacin  500 mg Oral Daily  . mouth rinse  15 mL Mouth Rinse BID  . methylPREDNISolone (SOLU-MEDROL) injection  60 mg Intravenous Q12H   Continuous Infusions:   LOS: 2 days    Time spent: [place], MD [facility]   If 7PM-7AM, please contact night-coverage

## 2020-01-26 LAB — MAGNESIUM: Magnesium: 2.3 mg/dL (ref 1.7–2.4)

## 2020-01-26 LAB — CBC WITH DIFFERENTIAL/PLATELET
Abs Immature Granulocytes: 0.12 10*3/uL — ABNORMAL HIGH (ref 0.00–0.07)
Basophils Absolute: 0 10*3/uL (ref 0.0–0.1)
Basophils Relative: 0 %
Eosinophils Absolute: 0 10*3/uL (ref 0.0–0.5)
Eosinophils Relative: 0 %
HCT: 41.9 % (ref 39.0–52.0)
Hemoglobin: 14.1 g/dL (ref 13.0–17.0)
Immature Granulocytes: 1 %
Lymphocytes Relative: 3 %
Lymphs Abs: 0.3 10*3/uL — ABNORMAL LOW (ref 0.7–4.0)
MCH: 31.9 pg (ref 26.0–34.0)
MCHC: 33.7 g/dL (ref 30.0–36.0)
MCV: 94.8 fL (ref 80.0–100.0)
Monocytes Absolute: 0.3 10*3/uL (ref 0.1–1.0)
Monocytes Relative: 2 %
Neutro Abs: 11 10*3/uL — ABNORMAL HIGH (ref 1.7–7.7)
Neutrophils Relative %: 94 %
Platelets: 229 10*3/uL (ref 150–400)
RBC: 4.42 MIL/uL (ref 4.22–5.81)
RDW: 12.4 % (ref 11.5–15.5)
WBC: 11.7 10*3/uL — ABNORMAL HIGH (ref 4.0–10.5)
nRBC: 0 % (ref 0.0–0.2)

## 2020-01-26 LAB — BASIC METABOLIC PANEL
Anion gap: 11 (ref 5–15)
BUN: 27 mg/dL — ABNORMAL HIGH (ref 8–23)
CO2: 28 mmol/L (ref 22–32)
Calcium: 9.2 mg/dL (ref 8.9–10.3)
Chloride: 98 mmol/L (ref 98–111)
Creatinine, Ser: 0.87 mg/dL (ref 0.61–1.24)
GFR calc Af Amer: >60 mL/min (ref >60)
GFR calc non Af Amer: >60 mL/min (ref >60)
Glucose, Bld: 239 mg/dL — ABNORMAL HIGH (ref 70–99)
Potassium: 4.3 mmol/L (ref 3.5–5.1)
Sodium: 137 mmol/L (ref 135–145)

## 2020-01-26 LAB — PHOSPHORUS: Phosphorus: 3.4 mg/dL (ref 2.5–4.6)

## 2020-01-26 LAB — GLUCOSE, CAPILLARY
Glucose-Capillary: 143 mg/dL — ABNORMAL HIGH (ref 70–99)
Glucose-Capillary: 163 mg/dL — ABNORMAL HIGH (ref 70–99)
Glucose-Capillary: 232 mg/dL — ABNORMAL HIGH (ref 70–99)

## 2020-01-26 MED ORDER — PREDNISONE 10 MG (21) PO TBPK
ORAL_TABLET | ORAL | 0 refills | Status: DC
Start: 2020-01-26 — End: 2020-04-30

## 2020-01-26 MED ORDER — LEVOFLOXACIN 500 MG PO TABS: 500.0000 mg | ORAL_TABLET | Freq: Every day | ORAL | 0 refills | Status: AC

## 2020-01-26 NOTE — Discharge Instructions (Signed)
Advised to follow-up with primary care physician in 1 week. Patient has been prescribed prednisone taper advised to take it as directed. Advised to take levofloxacin 750 mg for 1 day to complete 5-day course for bronchitis.

## 2020-01-26 NOTE — Discharge Summary (Signed)
Physician Discharge Summary  Duane Cuevas RWE:[id number] DOB: 05-29-1956 DOA: 01/23/2020  PCP: [name], MD  Admit date: 01/23/2020  Discharge date: 01/26/2020  Admitted From:  Home Disposition:  Home.  Recommendations for Outpatient Follow-up:  Follow up with PCP in 1-2 weeks. Please obtain BMP/CBC in one week. Patient has been prescribed prednisone taper,  advised to take it as directed. Advised to take levofloxacin 750 mg for 1 day to complete 5-day course for bronchitis.  Home Health: No Equipment/Devices: No  Discharge Condition: Stable CODE STATUS:Full code Diet recommendation: Heart Healthy / Carb Modified   [facility] course: Duane Cuevas is a 64 y.o. male with medical history significant of type 2 diabetes, emphysema/COPD, hepatic steatosis, thrombocytopenia, CAD, history of MI, hyperlipidemia who is returning to the emergency department after being seen yesterday morning due to COPD exacerbation with recurrence /worsening of his symptoms.  CTA and chest radiograph showed wall thickening of bilateral lower lobe bronchi likely due to chronic inflammation. SARS coronavirus 2 was negative. Patient was admitted for COPD exacerbation, he was started on IV Solu-Medrol, Duoneb treatments and Levaquin for bronchitis. He was significantly hypoxic requiring BiPAP at the time of admission. His oxygen saturation has significantly improved tapered down to 2L/ min, Patient continues to improve slowly, Solu-Medrol was tapered down and switched to prednisone taper. Patient has received Levaquin for 4 days. Feels much better , wheezing has improved and wants to be discharged. Patient was discharged home,  advised to follow-up with primary care physician in 1 week. Patient was advised to take Levaquin to complete 5-day course for bronchitis.    Discharge Diagnoses:  Active Problems:   COPD (chronic obstructive pulmonary disease) (HCC)   Type 2 diabetes mellitus  with hyperglycemia, without long-term current use of insulin (HCC)   Hyperlipidemia   CAD (coronary artery disease)  Acute respiratory failure with hypoxia (HCC) - Resolved.  COPD (chronic obstructive pulmonary disease) ([place]) -Has been tapered off BiPAP, O2 demand has been tapered down to 2 L, currently satting 92% -Continue BiPAP as needed. -Continue IV Solu-Medrol with echo taper -We will continue pulmonary toiletry -We will continue DuoNeb bronchodilators every 6 hours and scheduled (Switch to Xopenex/Atrovent, Pulmicort, Mucomyst.  Flutter and incentive spirometer) -Continuing empiric antibiotics, mucolytic's and toxins  -Continue supplemental oxygen to maintain O2 sat 88-92%     Type 2 diabetes mellitus with hyperglycemia,   without long-term current use of insulin (HCC) -Hyperglycemia due to steroids. -We will check blood sugar QA CHS with SSI coverage, Lantus initiated -Resuming home medication of glipizide 5 mg daily -Holding home medication of Metformin (500 mg p.o. 3 times daily) Continue 0.45% NaCl at 100 mL/h --will be DC'd     Hyperlipidemia No longer taking atorvastatin.   CAD (coronary artery disease) Not on statin or beta-blocker. Resume daily aspirin.  Discharge Instructions  Discharge Instructions     Call MD for:  difficulty breathing, headache or visual disturbances   Complete by: As directed    Call MD for:  persistant dizziness or light-headedness   Complete by: As directed    Call MD for:  persistant nausea and vomiting   Complete by: As directed    Call MD for:  temperature >100.4   Complete by: As directed    Diet - low sodium heart healthy   Complete by: As directed    Discharge instructions   Complete by: As directed    Advised to follow-up with primary care physician in 1 week. Patient  has been prescribed prednisone taper advised to take it as directed. Advised to take levofloxacin 750 mg for 1 day to complete 5-day course for bronchitis.    Increase activity slowly   Complete by: As directed       Allergies as of 01/26/2020   No Known Allergies      Medication List     STOP taking these medications    azithromycin 250 MG tablet Commonly known as: Zithromax Z-Pak   predniSONE 10 MG tablet Commonly known as: DELTASONE Replaced by: predniSONE 10 MG (21) Tbpk tablet       TAKE these medications    albuterol (2.5 MG/3ML) 0.083% nebulizer solution Commonly known as: PROVENTIL Take 3 mLs (2.5 mg total) by nebulization every 6 (six) hours as needed for wheezing or shortness of breath.   aspirin 81 MG EC tablet Take 1 tablet (81 mg total) by mouth daily.   atorvastatin 20 MG tablet Commonly known as: LIPITOR Take 1 tablet (20 mg total) by mouth daily.   budesonide-formoterol 160-4.5 MCG/ACT inhaler Commonly known as: Symbicort Inhale 2 puffs into the lungs 2 (two) times daily.   glipiZIDE 5 MG tablet Commonly known as: GLUCOTROL Take 1 tablet (5 mg total) by mouth daily with breakfast.   levofloxacin 500 MG tablet Commonly known as: LEVAQUIN Take 1 tablet (500 mg total) by mouth daily for 1 day. Start taking on: January 27, 2020   metFORMIN 500 MG tablet Commonly known as: GLUCOPHAGE Take 1 tablet (500 mg total) by mouth 2 (two) times daily with a meal.   multivitamin capsule Take 1 capsule by mouth daily.   nicotine 14 mg/24hr patch Commonly known as: NICODERM CQ - dosed in mg/24 hours Place 1 patch (14 mg total) onto the skin daily.   OXYGEN Inhale 2 L into the lungs daily as needed (SOB).   predniSONE 10 MG (21) Tbpk tablet Commonly known as: STERAPRED UNI-PAK 21 TAB Take as directed. Replaces: predniSONE 10 MG tablet        Follow-up Information     [name], MD Follow up in 1 week(s).   Specialty: Internal Medicine Contact information: [place] [id number] [phone]                No Known Allergies  Consultations:   None.   Procedures/Studies: DG Chest 2 View  Result Date: 01/22/2020 CLINICAL DATA:  Chest pain. EXAM: CHEST - 2 VIEW COMPARISON:  November 01, 2019. FINDINGS: The heart size and mediastinal contours are within normal limits. Both lungs are clear. No pneumothorax or pleural effusion is noted. The visualized skeletal structures are unremarkable. IMPRESSION: No active cardiopulmonary disease. Electronically Signed   By: Marijo Conception M.D.   On: 01/22/2020 11:24   CT Angio Chest PE W and/or Wo Contrast  Result Date: 01/22/2020 CLINICAL DATA:  Chest pain. EXAM: CT ANGIOGRAPHY CHEST WITH CONTRAST TECHNIQUE: Multidetector CT imaging of the chest was performed using the standard protocol during bolus administration of intravenous contrast. Multiplanar CT image reconstructions and MIPs were obtained to evaluate the vascular anatomy. CONTRAST:  61mL OMNIPAQUE IOHEXOL 350 MG/ML SOLN COMPARISON:  April 13, 2012. FINDINGS: Cardiovascular: Satisfactory opacification of the pulmonary arteries to the segmental level. No evidence of pulmonary embolism. Normal heart size. No pericardial effusion. Atherosclerosis of thoracic aorta is noted without aneurysm formation. Mediastinum/Nodes: No enlarged mediastinal, hilar, or axillary lymph nodes. Thyroid gland, trachea, and esophagus demonstrate no significant findings. Lungs/Pleura: No pneumothorax or pleural effusion  is noted. Emphysematous disease is noted in both upper lobes. Wall thickening of bilateral lower lobe bronchi is noted concerning for chronic inflammation. Upper Abdomen: No acute abnormality. Musculoskeletal: No chest wall abnormality. No acute or significant osseous findings. Review of the MIP images confirms the above findings. IMPRESSION: 1. No definite evidence of pulmonary embolus. 2. Wall thickening of bilateral lower lobe bronchi is noted concerning for chronic inflammation. Aortic Atherosclerosis (ICD10-I70.0) and Emphysema (ICD10-J43.9). Electronically  Signed   By: Marijo Conception M.D.   On: 01/22/2020 15:01   DG Chest Portable 1 View  Result Date: 01/23/2020 CLINICAL DATA:  Chest pain and tightness, COPD, emphysema EXAM: PORTABLE CHEST 1 VIEW COMPARISON:  01/22/2020 FINDINGS: Single frontal view of the chest demonstrates a stable cardiac silhouette. Extensive upper lobe predominant background emphysema is again noted, unchanged. Perihilar bronchovascular prominence consistent with bronchial wall thickening on previous CT. No airspace disease, effusion, or pneumothorax. IMPRESSION: 1. Persistent bronchial wall thickening consistent with chronic bronchitis. 2. Stable emphysema. 3. No acute airspace disease. Electronically Signed   By: Randa Ngo M.D.   On: 01/23/2020 00:27      Subjective: Patient was seen and examined at bedside.  No overnight events.  Patient reports feeling much better.  And wants to be discharged.  On examination there is no wheezing found.  Discharge Exam: Vitals:   01/26/20 0434 01/26/20 0812  BP: (!) 130/93   Pulse: 98   Resp: 18   Temp: 97.9 F (36.6 C)   SpO2: 93% 93%   Vitals:   01/25/20 2101 01/26/20 0247 01/26/20 0434 01/26/20 0812  BP:   (!) 130/93   Pulse:   98   Resp:   18   Temp:   97.9 F (36.6 C)   TempSrc:   Oral   SpO2: 97% 98% 93% 93%  Weight:      Height:        General: Pt is alert, awake, not in acute distress Cardiovascular: RRR, S1/S2 +, no rubs, no gallops Respiratory: CTA bilaterally, no wheezing, no rhonchi Abdominal: Soft, NT, ND, bowel sounds + Extremities: no edema, no cyanosis    The results of significant diagnostics from this hospitalization (including imaging, microbiology, ancillary and laboratory) are listed below for reference.     Microbiology: Recent Results (from the past 240 hour(s))  SARS Coronavirus 2 by RT PCR (hospital order, performed in [facility] hospital lab) Nasopharyngeal Nasopharyngeal Swab     Status: None   Collection Time: 01/22/20 12:19  PM   Specimen: Nasopharyngeal Swab  Result Value Ref Range Status   SARS Coronavirus 2 NEGATIVE NEGATIVE Final    Comment: (NOTE) SARS-CoV-2 target nucleic acids are NOT DETECTED.  The SARS-CoV-2 RNA is generally detectable in upper and lower respiratory specimens during the acute phase of infection. The lowest concentration of SARS-CoV-2 viral copies this assay can detect is 250 copies / mL. A negative result does not preclude SARS-CoV-2 infection and should not be used as the sole basis for treatment or other patient management decisions.  A negative result may occur with improper specimen collection / handling, submission of specimen other than nasopharyngeal swab, presence of viral mutation(s) within the areas targeted by this assay, and inadequate number of viral copies (<250 copies / mL). A negative result must be combined with clinical observations, patient history, and epidemiological information.  Fact Sheet for Patients:   StrictlyIdeas.no  Fact Sheet for Healthcare Providers: BankingDealers.co.za  This test is not yet approved or  cleared by the [place] and has been authorized for detection and/or diagnosis of SARS-CoV-2 by FDA under an Emergency Use Authorization (EUA).  This EUA will remain in effect (meaning this test can be used) for the duration of the COVID-19 declaration under Section 564(b)(1) of the Act, 21 U.S.C. section 360bbb-3(b)(1), unless the authorization is terminated or revoked sooner.  Performed at [facility], [place] [address], [place], [place] [address]   SARS Coronavirus 2 by RT PCR (hospital order, performed in [facility] hospital lab) Nasopharyngeal Nasopharyngeal Swab     Status: None   Collection Time: 01/23/20 12:43 AM   Specimen: Nasopharyngeal Swab  Result Value Ref Range Status   SARS Coronavirus 2 NEGATIVE NEGATIVE Final    Comment: (NOTE) SARS-CoV-2 target  nucleic acids are NOT DETECTED.  The SARS-CoV-2 RNA is generally detectable in upper and lower respiratory specimens during the acute phase of infection. The lowest concentration of SARS-CoV-2 viral copies this assay can detect is 250 copies / mL. A negative result does not preclude SARS-CoV-2 infection and should not be used as the sole basis for treatment or other patient management decisions.  A negative result may occur with improper specimen collection / handling, submission of specimen other than nasopharyngeal swab, presence of viral mutation(s) within the areas targeted by this assay, and inadequate number of viral copies (<250 copies / mL). A negative result must be combined with clinical observations, patient history, and epidemiological information.  Fact Sheet for Patients:   StrictlyIdeas.no  Fact Sheet for Healthcare Providers: BankingDealers.co.za  This test is not yet approved or  cleared by the [place] FDA and has been authorized for detection and/or diagnosis of SARS-CoV-2 by FDA under an Emergency Use Authorization (EUA).  This EUA will remain in effect (meaning this test can be used) for the duration of the COVID-19 declaration under Section 564(b)(1) of the Act, 21 U.S.C. section 360bbb-3(b)(1), unless the authorization is terminated or revoked sooner.  Performed at [facility], [place] [address], [place], [place] [address]      Labs: BNP (last 3 results) Recent Labs    01/22/20 1058  BNP 88.4   Basic Metabolic Panel: Recent Labs  Lab 01/22/20 1058 01/23/20 0050 01/23/20 0532 01/26/20 0452  NA 139 137 139 137  K 3.9 5.1 4.0 4.3  CL 102 104 104 98  CO2 25  --  20* 28  GLUCOSE 266* 356* 324* 239*  BUN 15 28* 22 27*  CREATININE 0.89 0.80 1.06 0.87  CALCIUM 9.3  --  9.5 9.2  MG  --   --   --  2.3  PHOS  --   --   --  3.4   Liver Function Tests: Recent Labs  Lab  01/23/20 0532  AST 17  ALT 20  ALKPHOS 67  BILITOT 0.7  PROT 7.5  ALBUMIN 4.0   No results for input(s): LIPASE, AMYLASE in the last 168 hours. No results for input(s): AMMONIA in the last 168 hours. CBC: Recent Labs  Lab 01/23/20 0043 01/23/20 0043 01/23/20 0050 01/23/20 0442 01/24/20 0422 01/25/20 0459 01/26/20 0452  WBC 9.6  --   --  8.9 16.8* 18.5* 11.7*  NEUTROABS 9.1*  --   --  8.7* 15.7* 17.5* 11.0*  HGB 13.9   < > 14.3 14.4 13.7 13.9 14.1  HCT 41.6   < > 42.0 43.0 41.1 41.7 41.9  MCV 96.7  --   --  96.6 96.9 95.9  94.8  PLT 200  --   --  210 222 245 229   < > = values in this interval not displayed.   Cardiac Enzymes: No results for input(s): CKTOTAL, CKMB, CKMBINDEX, TROPONINI in the last 168 hours. BNP: Invalid input(s): POCBNP CBG: Recent Labs  Lab 01/25/20 1658 01/25/20 2040 01/26/20 0011 01/26/20 0431 01/26/20 0750  GLUCAP 390* 250* 143* 232* 163*   D-Dimer No results for input(s): DDIMER in the last 72 hours. Hgb A1c No results for input(s): HGBA1C in the last 72 hours. Lipid Profile No results for input(s): CHOL, HDL, LDLCALC, TRIG, CHOLHDL, LDLDIRECT in the last 72 hours. Thyroid function studies No results for input(s): TSH, T4TOTAL, T3FREE, THYROIDAB in the last 72 hours.  Invalid input(s): FREET3 Anemia work up No results for input(s): VITAMINB12, FOLATE, FERRITIN, TIBC, IRON, RETICCTPCT in the last 72 hours. Urinalysis    Component Value Date/Time   COLORURINE YELLOW 09/23/2015 0616   APPEARANCEUR CLEAR 09/23/2015 0616   LABSPEC 1.019 09/23/2015 0616   PHURINE 7.0 09/23/2015 0616   GLUCOSEU NEGATIVE 09/23/2015 0616   HGBUR TRACE (A) 09/23/2015 0616   BILIRUBINUR NEGATIVE 09/23/2015 0616   KETONESUR NEGATIVE 09/23/2015 0616   PROTEINUR 100 (A) 09/23/2015 0616   UROBILINOGEN 0.2 10/14/2014 1712   NITRITE NEGATIVE 09/23/2015 0616   LEUKOCYTESUR NEGATIVE 09/23/2015 0616   Sepsis Labs Invalid input(s): PROCALCITONIN,  WBC,   LACTICIDVEN Microbiology Recent Results (from the past 240 hour(s))  SARS Coronavirus 2 by RT PCR (hospital order, performed in [facility] hospital lab) Nasopharyngeal Nasopharyngeal Swab     Status: None   Collection Time: 01/22/20 12:19 PM   Specimen: Nasopharyngeal Swab  Result Value Ref Range Status   SARS Coronavirus 2 NEGATIVE NEGATIVE Final    Comment: (NOTE) SARS-CoV-2 target nucleic acids are NOT DETECTED.  The SARS-CoV-2 RNA is generally detectable in upper and lower respiratory specimens during the acute phase of infection. The lowest concentration of SARS-CoV-2 viral copies this assay can detect is 250 copies / mL. A negative result does not preclude SARS-CoV-2 infection and should not be used as the sole basis for treatment or other patient management decisions.  A negative result may occur with improper specimen collection / handling, submission of specimen other than nasopharyngeal swab, presence of viral mutation(s) within the areas targeted by this assay, and inadequate number of viral copies (<250 copies / mL). A negative result must be combined with clinical observations, patient history, and epidemiological information.  Fact Sheet for Patients:   StrictlyIdeas.no  Fact Sheet for Healthcare Providers: BankingDealers.co.za  This test is not yet approved or  cleared by the [place] FDA and has been authorized for detection and/or diagnosis of SARS-CoV-2 by FDA under an Emergency Use Authorization (EUA).  This EUA will remain in effect (meaning this test can be used) for the duration of the COVID-19 declaration under Section 564(b)(1) of the Act, 21 U.S.C. section 360bbb-3(b)(1), unless the authorization is terminated or revoked sooner.  Performed at [facility], [place] [address], [place], [place] [address]   SARS Coronavirus 2 by RT PCR (hospital order, performed in [facility] hospital  lab) Nasopharyngeal Nasopharyngeal Swab     Status: None   Collection Time: 01/23/20 12:43 AM   Specimen: Nasopharyngeal Swab  Result Value Ref Range Status   SARS Coronavirus 2 NEGATIVE NEGATIVE Final    Comment: (NOTE) SARS-CoV-2 target nucleic acids are NOT DETECTED.  The SARS-CoV-2 RNA is generally detectable in upper and lower respiratory  specimens during the acute phase of infection. The lowest concentration of SARS-CoV-2 viral copies this assay can detect is 250 copies / mL. A negative result does not preclude SARS-CoV-2 infection and should not be used as the sole basis for treatment or other patient management decisions.  A negative result may occur with improper specimen collection / handling, submission of specimen other than nasopharyngeal swab, presence of viral mutation(s) within the areas targeted by this assay, and inadequate number of viral copies (<250 copies / mL). A negative result must be combined with clinical observations, patient history, and epidemiological information.  Fact Sheet for Patients:   StrictlyIdeas.no  Fact Sheet for Healthcare Providers: BankingDealers.co.za  This test is not yet approved or  cleared by the [place] FDA and has been authorized for detection and/or diagnosis of SARS-CoV-2 by FDA under an Emergency Use Authorization (EUA).  This EUA will remain in effect (meaning this test can be used) for the duration of the COVID-19 declaration under Section 564(b)(1) of the Act, 21 U.S.C. section 360bbb-3(b)(1), unless the authorization is terminated or revoked sooner.  Performed at [facility], [place] [address] [place], [place] [address]      Time coordinating discharge: Over 30 minutes  SIGNED:   Shawna Clamp, MD  [facility] 01/26/2020, 11:40 AM Pager   If 7PM-7AM, please contact night-coverage [email or web address]

## 2020-01-26 NOTE — Progress Notes (Signed)
Patient given discharge, follow up, and medication instructions, verbalized understanding, IV and telemetry monitor removed, prescriptions and personal belongings with patient, family to transport home  

## 2020-01-26 NOTE — Plan of Care (Signed)

## 2020-01-27 LAB — GLUCOSE, CAPILLARY: Glucose-Capillary: 246 mg/dL — ABNORMAL HIGH (ref 70–99)

## 2020-04-27 ENCOUNTER — Emergency Department (HOSPITAL_COMMUNITY): Payer: Self-pay

## 2020-04-27 ENCOUNTER — Other Ambulatory Visit: Payer: Self-pay

## 2020-04-27 ENCOUNTER — Inpatient Hospital Stay (HOSPITAL_COMMUNITY)
Admission: EM | Admit: 2020-04-27 | Discharge: 2020-04-30 | DRG: 192 | Disposition: A | Discharge status: Home / Self Care | Payer: Self-pay | Attending: Internal Medicine | Admitting: Internal Medicine

## 2020-04-27 ENCOUNTER — Encounter (HOSPITAL_COMMUNITY): Payer: Self-pay

## 2020-04-27 DIAGNOSIS — E785 Hyperlipidemia, unspecified: Secondary | ICD-10-CM | POA: Diagnosis present

## 2020-04-27 DIAGNOSIS — I2510 Atherosclerotic heart disease of native coronary artery without angina pectoris: Secondary | ICD-10-CM | POA: Diagnosis present

## 2020-04-27 DIAGNOSIS — E119 Type 2 diabetes mellitus without complications: Secondary | ICD-10-CM | POA: Diagnosis present

## 2020-04-27 DIAGNOSIS — T380X5A Adverse effect of glucocorticoids and synthetic analogues, initial encounter: Secondary | ICD-10-CM | POA: Diagnosis not present

## 2020-04-27 DIAGNOSIS — J441 Chronic obstructive pulmonary disease with (acute) exacerbation: Secondary | ICD-10-CM | POA: Diagnosis present

## 2020-04-27 DIAGNOSIS — J439 Emphysema, unspecified: Principal | ICD-10-CM | POA: Diagnosis present

## 2020-04-27 DIAGNOSIS — Z800 Family history of malignant neoplasm of digestive organs: Secondary | ICD-10-CM

## 2020-04-27 DIAGNOSIS — Z825 Family history of asthma and other chronic lower respiratory diseases: Secondary | ICD-10-CM

## 2020-04-27 DIAGNOSIS — Z7984 Long term (current) use of oral hypoglycemic drugs: Secondary | ICD-10-CM

## 2020-04-27 DIAGNOSIS — Z833 Family history of diabetes mellitus: Secondary | ICD-10-CM

## 2020-04-27 DIAGNOSIS — Z87891 Personal history of nicotine dependence: Secondary | ICD-10-CM

## 2020-04-27 DIAGNOSIS — E1165 Type 2 diabetes mellitus with hyperglycemia: Secondary | ICD-10-CM | POA: Diagnosis present

## 2020-04-27 DIAGNOSIS — E1169 Type 2 diabetes mellitus with other specified complication: Secondary | ICD-10-CM | POA: Diagnosis present

## 2020-04-27 DIAGNOSIS — Z20822 Contact with and (suspected) exposure to covid-19: Secondary | ICD-10-CM | POA: Diagnosis present

## 2020-04-27 DIAGNOSIS — R0789 Other chest pain: Secondary | ICD-10-CM | POA: Diagnosis present

## 2020-04-27 LAB — TROPONIN I (HIGH SENSITIVITY)
Troponin I (High Sensitivity): 3 ng/L (ref <18)
Troponin I (High Sensitivity): 5 ng/L (ref <18)

## 2020-04-27 LAB — CBC
HCT: 47.3 % (ref 39.0–52.0)
Hemoglobin: 16.2 g/dL (ref 13.0–17.0)
MCH: 32 pg (ref 26.0–34.0)
MCHC: 34.2 g/dL (ref 30.0–36.0)
MCV: 93.3 fL (ref 80.0–100.0)
Platelets: 245 10*3/uL (ref 150–400)
RBC: 5.07 MIL/uL (ref 4.22–5.81)
RDW: 11.7 % (ref 11.5–15.5)
WBC: 9.7 10*3/uL (ref 4.0–10.5)
nRBC: 0 % (ref 0.0–0.2)

## 2020-04-27 LAB — BASIC METABOLIC PANEL
Anion gap: 11 (ref 5–15)
BUN: 12 mg/dL (ref 8–23)
CO2: 24 mmol/L (ref 22–32)
Calcium: 9.2 mg/dL (ref 8.9–10.3)
Chloride: 99 mmol/L (ref 98–111)
Creatinine, Ser: 0.83 mg/dL (ref 0.61–1.24)
GFR, Estimated: >60 mL/min (ref >60)
Glucose, Bld: 172 mg/dL — ABNORMAL HIGH (ref 70–99)
Potassium: 4 mmol/L (ref 3.5–5.1)
Sodium: 134 mmol/L — ABNORMAL LOW (ref 135–145)

## 2020-04-27 LAB — RESPIRATORY PANEL BY RT PCR (FLU A&B, COVID)
Influenza A by PCR: NEGATIVE
Influenza B by PCR: NEGATIVE
SARS Coronavirus 2 by RT PCR: NEGATIVE

## 2020-04-27 LAB — D-DIMER, QUANTITATIVE: D-Dimer, Quant: 0.36 ug/mL-FEU (ref 0.00–0.50)

## 2020-04-27 MED ORDER — ACETAMINOPHEN 500 MG PO TABS
1000.0000 mg | ORAL_TABLET | Freq: Once | ORAL | Status: AC
  Administered 2020-04-27: 1000 mg via ORAL
  Filled 2020-04-27: qty 2

## 2020-04-27 MED ORDER — IPRATROPIUM-ALBUTEROL 20-100 MCG/ACT IN AERS
2.0000 | INHALATION_SPRAY | Freq: Once | RESPIRATORY_TRACT | Status: AC
  Administered 2020-04-27: 2 via RESPIRATORY_TRACT
  Filled 2020-04-27: qty 4

## 2020-04-27 MED ORDER — IPRATROPIUM-ALBUTEROL 0.5-2.5 (3) MG/3ML IN SOLN
3.0000 mL | Freq: Once | RESPIRATORY_TRACT | Status: AC
  Administered 2020-04-27: 3 mL via RESPIRATORY_TRACT
  Filled 2020-04-27: qty 3

## 2020-04-27 MED ORDER — AZITHROMYCIN 250 MG PO TABS
500.0000 mg | ORAL_TABLET | Freq: Once | ORAL | Status: AC
  Administered 2020-04-27: 500 mg via ORAL
  Filled 2020-04-27: qty 2

## 2020-04-27 MED ORDER — METHYLPREDNISOLONE SODIUM SUCC 125 MG IJ SOLR
125.0000 mg | Freq: Once | INTRAMUSCULAR | Status: AC
  Administered 2020-04-27: 125 mg via INTRAVENOUS
  Filled 2020-04-27: qty 2

## 2020-04-27 MED ORDER — IPRATROPIUM-ALBUTEROL 0.5-2.5 (3) MG/3ML IN SOLN: 3.0000 mL | Freq: Once | RESPIRATORY_TRACT | Status: DC

## 2020-04-27 NOTE — ED Notes (Signed)
ED Provider at bedside. 

## 2020-04-27 NOTE — ED Notes (Signed)
Patient is resting comfortably. 

## 2020-04-27 NOTE — ED Provider Notes (Signed)
[name] DEPT Provider Note   CSN: [id number] Arrival date & time: 04/27/20  1702     History Chief Complaint  Patient presents with   Chest Pain   Shortness of Breath   Numbness    Duane Cuevas is a 64 y.o. male w PMHx emphysema/COPD, CAD, T2DM, presenting to the ED with complaint of 2 days of shortness of breath with left-sided chest pain.  He states yesterday his symptoms were coming and going however today it is become constant.  He is now having a constant tightness sensation in his left chest as well.  He feels some sensation down his left arm.  He has been coughing as well that is productive of a white sputum and sometimes yellow. He feels very fatigued and SOB with little exertion.  No fevers or known Covid exposures.  He is vaccinated against Covid and has also had the flu shot this year.  His symptoms do feel similar to prior COPD exacerbations.  He has treated with albuterol nebulizer at home with minimal relief.   No history of DVT or PE, no unilateral leg pain or swelling, recent immobilization or surgeries.  No personal history of cancer.  He intermittently smokes tobacco.  The history is provided by the patient.       Past Medical History:  Diagnosis Date   Diabetes mellitus (Duane Cuevas) 10/2014   pt denies being diabetic.    Emphysema of lung (Duane Cuevas)    Emphysema/COPD (Duane Cuevas) 10/2014   Hepatic steatosis    Thrombocytopenia (Duane Cuevas) 09/2015   platelets in 120s.     Patient Active Problem List   Diagnosis Date Noted   Acute exacerbation of chronic obstructive pulmonary disease (COPD) ([place]) 04/27/2020   CAD (coronary artery disease) 01/23/2020   Hyperlipidemia 11/02/2019   History of MI (myocardial infarction) 11/02/2019   Chest pain 11/01/2019   Cigarette smoker 08/15/2018   COPD ? GOLD III/ active smoker 08/14/2018   History of diet-controlled diabetes 06/30/2018   COPD (chronic obstructive pulmonary disease) (Duane Cuevas)  10/14/2014   Type 2 diabetes mellitus with hyperglycemia, without long-term current use of insulin (Duane Cuevas) 10/14/2014    Past Surgical History:  Procedure Laterality Date   ESOPHAGOGASTRODUODENOSCOPY N/A 09/23/2015   Procedure: ESOPHAGOGASTRODUODENOSCOPY (EGD);  Surgeon: Ladene Artist, MD;  Location: [facility];  Service: Endoscopy;  Laterality: N/A;   FOOT SURGERY     INGUINAL HERNIA REPAIR Left 05/15/2018   Procedure: LEFT INGUINAL HERNIA REPAIR WITH MESH;  Surgeon: Erroll Luna, MD;  Location: [facility];  Service: General;  Laterality: Left;   INSERTION OF MESH Left 05/15/2018   Procedure: INSERTION OF MESH;  Surgeon: Erroll Luna, MD;  Location: [facility];  Service: General;  Laterality: Left;   UPPER GASTROINTESTINAL ENDOSCOPY         Family History  Problem Relation Age of Onset   Emphysema Mother    Diabetes Mother    Emphysema Father    Cancer Sister        Stomach cancer   Diabetes Sister    Stomach cancer Sister    Diabetes Brother    Colon cancer Neg Hx    Colon polyps Neg Hx    Esophageal cancer Neg Hx    Rectal cancer Neg Hx     Social History   Tobacco Use   Smoking status: Current Every Day Smoker    Packs/day: 0.15    Years: 47.00    Pack years: 7.05    Types: Cigarettes  Smokeless tobacco: Never Used  Vaping Use   Vaping Use: Former  Substance Use Topics   Alcohol use: Yes    Comment: occ   Drug use: No    Home Medications Prior to Admission medications   Medication Sig Start Date End Date Taking? Authorizing Provider  acetaminophen (TYLENOL) 500 MG tablet Take 500 mg by mouth every 6 (six) hours as needed.   Yes [provider]  albuterol (PROVENTIL) (2.5 MG/3ML) 0.083% nebulizer solution Take 3 mLs (2.5 mg total) by nebulization every 6 (six) hours as needed for wheezing or shortness of breath. Patient taking differently: Take 2.5 mg by nebulization every 6 (six) hours as needed for wheezing or shortness of  breath. Wheezing & shortness of breath 11/25/19  Yes Sagardia, Ines Bloomer, MD  glipiZIDE (GLUCOTROL) 5 MG tablet Take 1 tablet (5 mg total) by mouth daily with breakfast. 08/26/19 04/27/20 Yes Sagardia, Ines Bloomer, MD  metFORMIN (GLUCOPHAGE) 500 MG tablet Take 1 tablet (500 mg total) by mouth 2 (two) times daily with a meal. 08/26/19  Yes Sagardia, Ines Bloomer, MD  nicotine (NICODERM CQ - DOSED IN MG/24 HOURS) 14 mg/24hr patch Place 1 patch (14 mg total) onto the skin daily. 11/03/19  Yes Annita Brod, MD  aspirin EC 81 MG EC tablet Take 1 tablet (81 mg total) by mouth daily. Patient not taking: Reported on 01/22/2020 11/03/19   Annita Brod, MD  atorvastatin (LIPITOR) 20 MG tablet Take 1 tablet (20 mg total) by mouth daily. Patient not taking: Reported on 04/27/2020 08/26/19   [name], MD  budesonide-formoterol [facility]) 160-4.5 MCG/ACT inhaler Inhale 2 puffs into the lungs 2 (two) times daily. Patient not taking: Reported on 01/22/2020 08/26/19   [name], MD  predniSONE (STERAPRED UNI-PAK 21 TAB) 10 MG (21) TBPK tablet Take as directed. Patient not taking: Reported on 04/27/2020 01/26/20   Shawna Clamp, MD    Allergies    Patient has no known allergies.  Review of Systems   Review of Systems  Respiratory: Positive for cough and shortness of breath.   Cardiovascular: Positive for chest pain.  All other systems reviewed and are negative.   Physical Exam Updated Vital Signs BP 120/79    Pulse 90    Temp 98.1 F (36.7 C) (Oral)    Resp (!) 24    Ht 5\' 8"  (1.727 m)    Wt 79.4 kg    SpO2 94%    BMI 26.61 kg/m   Physical Exam Vitals and nursing note reviewed.  Constitutional:      General: He is not in acute distress.    Appearance: He is well-developed.  HENT:     Head: Normocephalic and atraumatic.  Eyes:     Conjunctiva/sclera: Conjunctivae normal.  Cardiovascular:     Rate and Rhythm: Normal rate and regular rhythm.  Pulmonary:     Effort:  Pulmonary effort is normal.     Comments: lung sounds are diminished bilaterally with faint wheezes.  Patient is tachypneic with some accessory muscle use noted. Normal O2 saturation on RA. Abdominal:     Palpations: Abdomen is soft.  Musculoskeletal:     Right lower leg: No edema.     Left lower leg: No edema.  Skin:    General: Skin is warm.  Neurological:     Mental Status: He is alert.  Psychiatric:        Behavior: Behavior normal.     ED Results / Procedures /  Treatments   Labs (all labs ordered are listed, but only abnormal results are displayed) Labs Reviewed  BASIC METABOLIC PANEL - Abnormal; Notable for the following components:      Result Value   Sodium 134 (*)    Glucose, Bld 172 (*)    All other components within normal limits  RESPIRATORY PANEL BY RT PCR (FLU A&B, COVID)  CBC  D-DIMER, QUANTITATIVE (NOT AT [facility])  TROPONIN I (HIGH SENSITIVITY)  TROPONIN I (HIGH SENSITIVITY)    EKG EKG Interpretation  Date/Time:  Monday April 27 2020 17:10:47 EDT Ventricular Rate:  106 PR Interval:    QRS Duration: 89 QT Interval:  352 QTC Calculation: 468 R Axis:   81 Text Interpretation: Sinus tachycardia Atrial premature complex Biatrial enlargement Borderline right axis deviation No significant change since prior 7/21 Confirmed by Aletta Edouard [phone]) on 04/27/2020 5:25:48 PM   Radiology DG Chest 2 View  Result Date: 04/27/2020 CLINICAL DATA:  Intermittent chest pain and shortness of breath. EXAM: CHEST - 2 VIEW COMPARISON:  January 23, 2020 FINDINGS: The heart size and mediastinal contours are within normal limits. Both lungs are clear. The visualized skeletal structures are unremarkable. IMPRESSION: No active cardiopulmonary disease. Electronically Signed   By: Virgina Norfolk M.D.   On: 04/27/2020 17:37    Procedures Procedures (including critical care time)  Medications Ordered in ED Medications  azithromycin (ZITHROMAX) tablet 500 mg (has no  administration in time range)  methylPREDNISolone sodium succinate (SOLU-MEDROL) 125 mg/2 mL injection 125 mg (125 mg Intravenous Given 04/27/20 1847)  Ipratropium-Albuterol (COMBIVENT) respimat 2 puff (2 puffs Inhalation Given 04/27/20 1928)  ipratropium-albuterol (DUONEB) 0.5-2.5 (3) MG/3ML nebulizer solution 3 mL (3 mLs Nebulization Given 04/27/20 2022)  acetaminophen (TYLENOL) tablet 1,000 mg (1,000 mg Oral Given 04/27/20 2022)    ED Course  I have reviewed the triage vital signs and the nursing notes.  Pertinent labs & imaging results that were available during my care of the patient were reviewed by me and considered in my medical decision making (see chart for details).    MDM Rules/Calculators/A&P                          Patient presenting to the emergency department with complaint of 2 days of shortness of breath with left-sided chest pain.  Shortness of breath is worse with any exertion, he has associated productive cough, no fevers.  Vaccinated against Covid.  No risk factors for PE with the exception of age and tobacco use.  He is tachypneic with diminished lung sounds bilaterally and faint wheezes.  He is not hypoxic.  He is treated with DuoNeb and Solu-Medrol with minimal relief.  Given patient's associated chest pain and flat troponins, nonischemic EKG, negative chest x-ray, D-dimer was obtained and is also within normal limits.  Low suspicion for PE.  Flu and Covid negative.  Patient ambulated in the ED, maintain O2 sat however felt significantly dyspneic.  Given patient's minimal improvement with ED interventions and significant dyspnea on exertion, will admit for further management of COPD exacerbation.    Patient discussed with and evaluated by Dr. Laverta Baltimore, who agrees with work-up and care plan for admission.  Final Clinical Impression(s) / ED Diagnoses Final diagnoses:  COPD exacerbation [facility])    Rx / [place] [facility] Orders ED Discharge Orders    None       Tyshawna Alarid, [place] N,  PA-C 04/27/20 2351    Margette Fast, MD 04/30/20 [phone]

## 2020-04-27 NOTE — ED Notes (Signed)
Oxygen level while ambulating on room air 94-95%.

## 2020-04-27 NOTE — ED Triage Notes (Signed)
Patient c/o intermittent chest pain and SOB since yesterday. Patient states a history of emphysema. Patient also c/o left middle and ring finger numbness.

## 2020-04-28 ENCOUNTER — Observation Stay (HOSPITAL_COMMUNITY): Payer: Self-pay

## 2020-04-28 DIAGNOSIS — R079 Chest pain, unspecified: Secondary | ICD-10-CM

## 2020-04-28 DIAGNOSIS — J441 Chronic obstructive pulmonary disease with (acute) exacerbation: Secondary | ICD-10-CM

## 2020-04-28 LAB — GLUCOSE, CAPILLARY
Glucose-Capillary: 252 mg/dL — ABNORMAL HIGH (ref 70–99)
Glucose-Capillary: 332 mg/dL — ABNORMAL HIGH (ref 70–99)
Glucose-Capillary: 256 mg/dL — ABNORMAL HIGH (ref 70–99)
Glucose-Capillary: 437 mg/dL — ABNORMAL HIGH (ref 70–99)

## 2020-04-28 LAB — HEMOGLOBIN A1C
Hgb A1c MFr Bld: 8.9 % — ABNORMAL HIGH (ref 4.8–5.6)
Mean Plasma Glucose: 209 mg/dL

## 2020-04-28 LAB — ECHOCARDIOGRAM COMPLETE
Area-P 1/2: 4.39 cm2
Calc EF: 78 %
Height: 68 in
S' Lateral: 2.2 cm
Single Plane A2C EF: 74.7 %
Single Plane A4C EF: 83.6 %
Weight: 2643.2 oz

## 2020-04-28 LAB — GLUCOSE, RANDOM: Glucose, Bld: 447 mg/dL — ABNORMAL HIGH (ref 70–99)

## 2020-04-28 MED ORDER — INSULIN ASPART 100 UNIT/ML ~~LOC~~ SOLN: 0.0000 [IU] | Freq: Every day | SUBCUTANEOUS | Status: DC

## 2020-04-28 MED ORDER — GUAIFENESIN-DM 100-10 MG/5ML PO SYRP
10.0000 mL | ORAL_SOLUTION | ORAL | Status: DC | PRN
  Administered 2020-04-28 – 2020-04-29 (×2): 10 mL via ORAL
  Filled 2020-04-28 (×2): qty 10

## 2020-04-28 MED ORDER — METHYLPREDNISOLONE SODIUM SUCC 125 MG IJ SOLR
60.0000 mg | Freq: Two times a day (BID) | INTRAMUSCULAR | Status: AC
  Administered 2020-04-28 – 2020-04-29 (×3): 60 mg via INTRAVENOUS
  Filled 2020-04-28 (×3): qty 2

## 2020-04-28 MED ORDER — MOMETASONE FURO-FORMOTEROL FUM 200-5 MCG/ACT IN AERO
2.0000 | INHALATION_SPRAY | Freq: Two times a day (BID) | RESPIRATORY_TRACT | Status: DC
  Filled 2020-04-28: qty 8.8

## 2020-04-28 MED ORDER — INSULIN ASPART 100 UNIT/ML ~~LOC~~ SOLN
0.0000 [IU] | Freq: Three times a day (TID) | SUBCUTANEOUS | Status: DC
  Administered 2020-04-28: 8 [IU] via SUBCUTANEOUS
  Administered 2020-04-29: 15 [IU] via SUBCUTANEOUS

## 2020-04-28 MED ORDER — INSULIN ASPART 100 UNIT/ML ~~LOC~~ SOLN
0.0000 [IU] | Freq: Every day | SUBCUTANEOUS | Status: DC
  Filled 2020-04-28: qty 0.05

## 2020-04-28 MED ORDER — INSULIN ASPART 100 UNIT/ML ~~LOC~~ SOLN
7.0000 [IU] | Freq: Once | SUBCUTANEOUS | Status: AC
  Administered 2020-04-28: 7 [IU] via SUBCUTANEOUS

## 2020-04-28 MED ORDER — ATORVASTATIN CALCIUM 20 MG PO TABS
20.0000 mg | ORAL_TABLET | Freq: Every day | ORAL | Status: DC
  Administered 2020-04-28 – 2020-04-30 (×3): 20 mg via ORAL
  Filled 2020-04-28 (×3): qty 1

## 2020-04-28 MED ORDER — ENOXAPARIN SODIUM 40 MG/0.4ML ~~LOC~~ SOLN
40.0000 mg | SUBCUTANEOUS | Status: DC
  Administered 2020-04-28 – 2020-04-30 (×3): 40 mg via SUBCUTANEOUS
  Filled 2020-04-28 (×3): qty 0.4

## 2020-04-28 MED ORDER — ACETAMINOPHEN 500 MG PO TABS
1000.0000 mg | ORAL_TABLET | Freq: Three times a day (TID) | ORAL | Status: DC | PRN
  Administered 2020-04-29: 1000 mg via ORAL
  Filled 2020-04-28: qty 2

## 2020-04-28 MED ORDER — ALBUTEROL SULFATE (2.5 MG/3ML) 0.083% IN NEBU: 2.5000 mg | INHALATION_SOLUTION | RESPIRATORY_TRACT | Status: DC | PRN

## 2020-04-28 MED ORDER — INSULIN ASPART 100 UNIT/ML ~~LOC~~ SOLN
0.0000 [IU] | Freq: Three times a day (TID) | SUBCUTANEOUS | Status: DC
  Administered 2020-04-28: 7 [IU] via SUBCUTANEOUS
  Administered 2020-04-28: 5 [IU] via SUBCUTANEOUS
  Filled 2020-04-28: qty 0.09

## 2020-04-28 MED ORDER — GUAIFENESIN ER 600 MG PO TB12
1200.0000 mg | ORAL_TABLET | Freq: Two times a day (BID) | ORAL | Status: DC
  Administered 2020-04-28 – 2020-04-30 (×5): 1200 mg via ORAL
  Filled 2020-04-28 (×5): qty 2

## 2020-04-28 MED ORDER — INSULIN GLARGINE 100 UNIT/ML ~~LOC~~ SOLN
10.0000 [IU] | Freq: Every day | SUBCUTANEOUS | Status: DC
  Administered 2020-04-28: 10 [IU] via SUBCUTANEOUS
  Filled 2020-04-28: qty 0.1

## 2020-04-28 MED ORDER — METHYLPREDNISOLONE SODIUM SUCC 125 MG IJ SOLR
60.0000 mg | Freq: Every day | INTRAMUSCULAR | Status: DC
  Administered 2020-04-28: 60 mg via INTRAVENOUS
  Filled 2020-04-28: qty 2

## 2020-04-28 MED ORDER — IPRATROPIUM-ALBUTEROL 0.5-2.5 (3) MG/3ML IN SOLN
3.0000 mL | Freq: Four times a day (QID) | RESPIRATORY_TRACT | Status: DC
  Administered 2020-04-28 – 2020-04-30 (×10): 3 mL via RESPIRATORY_TRACT
  Filled 2020-04-28 (×10): qty 3

## 2020-04-28 MED ORDER — BENZONATATE 100 MG PO CAPS
200.0000 mg | ORAL_CAPSULE | Freq: Three times a day (TID) | ORAL | Status: DC
  Administered 2020-04-28 – 2020-04-30 (×7): 200 mg via ORAL
  Filled 2020-04-28 (×7): qty 2

## 2020-04-28 MED ORDER — AZITHROMYCIN 250 MG PO TABS
250.0000 mg | ORAL_TABLET | Freq: Every day | ORAL | Status: DC
  Administered 2020-04-28 – 2020-04-29 (×2): 250 mg via ORAL
  Filled 2020-04-28 (×2): qty 1

## 2020-04-28 MED ORDER — MORPHINE SULFATE (PF) 2 MG/ML IV SOLN
2.0000 mg | INTRAVENOUS | Status: DC | PRN
  Administered 2020-04-28 – 2020-04-29 (×5): 2 mg via INTRAVENOUS
  Filled 2020-04-28 (×5): qty 1

## 2020-04-28 MED ORDER — ASPIRIN EC 81 MG PO TBEC
81.0000 mg | DELAYED_RELEASE_TABLET | Freq: Every day | ORAL | Status: DC
  Administered 2020-04-28 – 2020-04-30 (×3): 81 mg via ORAL
  Filled 2020-04-28 (×3): qty 1

## 2020-04-28 MED ORDER — GUAIFENESIN ER 600 MG PO TB12: 1200.0000 mg | ORAL_TABLET | Freq: Two times a day (BID) | ORAL | Status: DC

## 2020-04-28 NOTE — Progress Notes (Signed)
  Echocardiogram 2D Echocardiogram has been performed.  Roseanna Rainbow R 04/28/2020, 10:00 AM

## 2020-04-28 NOTE — H&P (Signed)
History and Physical  Armonie Staten ELF:[id number] DOB: Nov 14, 1955 DOA: 04/27/2020  Referring physician: Dr. Quentin Cornwall, [place]  PCP: [name], MD  Outpatient Specialists: Pulmonary Patient coming from: Home Chief Complaint: Shortness of breath and chest pain  HPI: Adnan Vanvoorhis is a 64 y.o. male with medical history significant for COPD, emphysema, previous tobacco use disorder, quit tobacco use more than 6 months ago, who presented to [facility] ED with complaints of sudden onset dyspnea this morning around 11 AM while walking around his home.  Associated with a worsening productive cough with white sputum and left-sided chest pain worse with coughing and taking a deep breath.  States his chest pain has been constant, 8 out of 10 and non radiating, still present in the ED at the time of this visit.  He denies any fevers or chills at home.  No recent tobacco use.  Presented to the ED for further evaluation and management.  Chest x-ray no active cardiopulmonary disease.  First set of troponin negative.  Negative D-dimer.  Received IV Solu-Medrol and bronchodilators in the ED without improvement of symptomatology.  TRH asked to admit.  ED Course:  Afebrile, heart rate 108, respiration rate 23, O2 saturation 95% on room air.  No leukocytosis or neutrophilia on lab studies.  Review of Systems: Review of systems as noted in the HPI. All other systems reviewed and are negative.   Past Medical History:  Diagnosis Date   Diabetes mellitus ([place]) 10/2014   pt denies being diabetic.    Emphysema of lung ([place])    Emphysema/COPD ([place]) 10/2014   Hepatic steatosis    Thrombocytopenia ([place]) 09/2015   platelets in 120s.    Past Surgical History:  Procedure Laterality Date   ESOPHAGOGASTRODUODENOSCOPY N/A 09/23/2015   Procedure: ESOPHAGOGASTRODUODENOSCOPY (EGD);  Surgeon: Ladene Artist, MD;  Location: [facility];  Service: Endoscopy;  Laterality: N/A;   FOOT SURGERY     INGUINAL  HERNIA REPAIR Left 05/15/2018   Procedure: LEFT INGUINAL HERNIA REPAIR WITH MESH;  Surgeon: Erroll Luna, MD;  Location: [place];  Service: General;  Laterality: Left;   INSERTION OF MESH Left 05/15/2018   Procedure: INSERTION OF MESH;  Surgeon: Erroll Luna, MD;  Location: [place] [facility];  Service: General;  Laterality: Left;   UPPER GASTROINTESTINAL ENDOSCOPY      Social History:  reports that he has been smoking cigarettes. He has a 7.05 pack-year smoking history. He has never used smokeless tobacco. He reports current alcohol use. He reports that he does not use drugs.   No Known Allergies  Family History  Problem Relation Age of Onset   Emphysema Mother    Diabetes Mother    Emphysema Father    Cancer Sister        Stomach cancer   Diabetes Sister    Stomach cancer Sister    Diabetes Brother    Colon cancer Neg Hx    Colon polyps Neg Hx    Esophageal cancer Neg Hx    Rectal cancer Neg Hx       Prior to Admission medications   Medication Sig Start Date End Date Taking? Authorizing Provider  acetaminophen (TYLENOL) 500 MG tablet Take 500 mg by mouth every 6 (six) hours as needed.   Yes [provider]  albuterol (PROVENTIL) (2.5 MG/3ML) 0.083% nebulizer solution Take 3 mLs (2.5 mg total) by nebulization every 6 (six) hours as needed for wheezing or shortness of breath. Patient taking differently: Take 2.5 mg by nebulization every  6 (six) hours as needed for wheezing or shortness of breath. Wheezing & shortness of breath 11/25/19  Yes Sagardia, Ines Bloomer, MD  glipiZIDE (GLUCOTROL) 5 MG tablet Take 1 tablet (5 mg total) by mouth daily with breakfast. 08/26/19 04/27/20 Yes Sagardia, Ines Bloomer, MD  metFORMIN (GLUCOPHAGE) 500 MG tablet Take 1 tablet (500 mg total) by mouth 2 (two) times daily with a meal. 08/26/19  Yes Sagardia, Ines Bloomer, MD  nicotine (NICODERM CQ - DOSED IN MG/24 HOURS) 14 mg/24hr patch Place 1 patch (14 mg total) onto the skin daily. 11/03/19   Yes Annita Brod, MD  aspirin EC 81 MG EC tablet Take 1 tablet (81 mg total) by mouth daily. Patient not taking: Reported on 01/22/2020 11/03/19   Annita Brod, MD  atorvastatin (LIPITOR) 20 MG tablet Take 1 tablet (20 mg total) by mouth daily. Patient not taking: Reported on 04/27/2020 08/26/19   [name], MD  budesonide-formoterol [facility]) 160-4.5 MCG/ACT inhaler Inhale 2 puffs into the lungs 2 (two) times daily. Patient not taking: Reported on 01/22/2020 08/26/19   [name], MD  predniSONE (STERAPRED UNI-PAK 21 TAB) 10 MG (21) TBPK tablet Take as directed. Patient not taking: Reported on 04/27/2020 01/26/20   Shawna Clamp, MD    Physical Exam: BP 120/79    Pulse 90    Temp 98.1 F (36.7 C) (Oral)    Resp (!) 24    Ht 5\' 8"  (1.727 m)    Wt 79.4 kg    SpO2 94%    BMI 26.61 kg/m    General: 64 y.o. year-old male well developed well nourished in no acute distress.  Alert and oriented x3.  Cardiovascular: Regular rate and rhythm with no rubs or gallops.  No thyromegaly or JVD noted.  No lower extremity edema. 2/4 pulses in all 4 extremities.  Respiratory: Diffuse wheezing bilaterally.  Poor inspiratory effort.  Abdomen: Soft nontender nondistended with normal bowel sounds x4 quadrants.  Muskuloskeletal: No cyanosis, clubbing or edema noted bilaterally  Neuro: CN II-XII intact, strength, sensation, reflexes  Skin: No ulcerative lesions noted or rashes  Psychiatry: Judgement and insight appear normal. Mood is appropriate for condition and setting          Labs on Admission:  Basic Metabolic Panel: Recent Labs  Lab 04/27/20 1736  NA 134*  K 4.0  CL 99  CO2 24  GLUCOSE 172*  BUN 12  CREATININE 0.83  CALCIUM 9.2   Liver Function Tests: No results for input(s): AST, ALT, ALKPHOS, BILITOT, PROT, ALBUMIN in the last 168 hours. No results for input(s): LIPASE, AMYLASE in the last 168 hours. No results for input(s): AMMONIA in the last 168  hours. CBC: Recent Labs  Lab 04/27/20 1736  WBC 9.7  HGB 16.2  HCT 47.3  MCV 93.3  PLT 245   Cardiac Enzymes: No results for input(s): CKTOTAL, CKMB, CKMBINDEX, TROPONINI in the last 168 hours.  BNP (last 3 results) Recent Labs    01/22/20 1058  BNP 42.4    ProBNP (last 3 results) No results for input(s): PROBNP in the last 8760 hours.  CBG: No results for input(s): GLUCAP in the last 168 hours.  Radiological Exams on Admission: DG Chest 2 View  Result Date: 04/27/2020 CLINICAL DATA:  Intermittent chest pain and shortness of breath. EXAM: CHEST - 2 VIEW COMPARISON:  January 23, 2020 FINDINGS: The heart size and mediastinal contours are within normal limits. Both lungs are clear. The visualized skeletal structures  are unremarkable. IMPRESSION: No active cardiopulmonary disease. Electronically Signed   By: Virgina Norfolk M.D.   On: 04/27/2020 17:37    EKG: I independently viewed the EKG done and my findings are as followed: Sinus tachycardia rate of 106 and no specific ST-T changes.  Assessment/Plan Present on Admission:  Acute exacerbation of chronic obstructive pulmonary disease (COPD) ([place])  Active Problems:   Acute exacerbation of chronic obstructive pulmonary disease (COPD) ([facility])   Acute exacerbation of COPD History of COPD/emphysema, followed by pulmonary outpatient Presented with sudden dyspnea with onset this morning and worsening productive cough with white sputum. Afebrile with no leukocytosis Diffused wheezing on exam Chest x-ray no evidence of acute cardiopulmonary disease Received IV Solu-Medrol and duo nebs in the ED without significant improvement of symptomatology Continue IV Solu-Medrol and taper dose Continue bronchodilators and pulmonary toilet Continue azithromycin Currently not hypoxic Maintain O2 saturation greater than 90%  Left-sided pleuritic chest pain, rule out cardiac etiology Reported constant left sided pleuritic chest pain, worse  with coughing or taking a deep breath First set of troponin negative, D-dimer negative No diffuse ST elevation on twelve-lead EKG Due to persistent symptomatology will obtain a 2D echo, follow results  Type 2 diabetes with hyperglycemia Presented with serum glucose 172 Obtain hemoglobin A1c Hold home oral hypoglycemics Start insulin sliding scale  Hyperlipidemia Resume home Lipitor  Former tobacco user Self-reported, quit tobacco use more than 6 months ago Encouraged to continue to abstain from tobacco use.  History of emphysema Follows with pulmonary outpatient Resume home bronchodilators Monitor O2 saturation     DVT prophylaxis: Subcu Lovenox daily  Code Status: Full code as stated by the patient himself  Family Communication: None at bedside.  Disposition Plan: Admit to telemetry unit  Consults called: None.  Admission status: Observation status.   Status is: Observation    Dispo:  Patient From: Home  Planned Disposition: Home  Expected discharge date: 04/29/20  Medically stable for discharge: No, ongoing management of COPD exacerbation and evaluation of left pleuritic chest pain.   Kayleen Memos MD [facility] Pager [phone]  If 7PM-7AM, please contact night-coverage [email or web address] Password [email or web address]  04/28/2020, 12:09 AM

## 2020-04-28 NOTE — Progress Notes (Signed)
Updated daughter via phone.  All questions answered.

## 2020-04-28 NOTE — Progress Notes (Signed)
PROGRESS NOTE   Duane Cuevas  VWU:[id number]    DOB: Nov 23, 1955    DOA: 04/27/2020  PCP: [name], MD   I have briefly reviewed patients previous medical records in [facility].  Chief Complaint  Patient presents with  . Chest Pain  . Shortness of Breath  . Numbness    Brief Narrative:  XT 64-year-old Spanish-speaking male with PMH of DM2, COPD, hepatic steatosis, thrombocytopenia, prior tobacco use disorder and quit approximately 6 months ago, presented to the ED on 04/27/2020 with 2-day history of progressive productive cough, dyspnea, wheezing and chest pain worse with coughing.  Admitted for COPD exacerbation.  Assessment & Plan:  Active Problems:   Acute exacerbation of chronic obstructive pulmonary disease (COPD) (HCC)   COPD exacerbation: Likely precipitated by viral URTI.  Denies sickly contacts.  No recent long distance travels.  Increased IV Solu-Medrol to 60 mg every 12 hours, continue and complete course of azithromycin, added flutter valve, supportive treatment with scheduled Tessalon Perles for bothersome cough and as needed Robitussin-DM.  Monitor closely.  Outpatient pulmonology follow-up.  X-ray without acute disease.  Not hypoxic.  Musculoskeletal chest pain Secondary to intractable cough.  Reproducible tenderness. HS troponin x2: Negative.  D-dimer negative.  Address cough as above.  As needed Tylenol.  TTE: LVEF 29-56%, grade 1 diastolic dysfunction  Type II DM with hyperglycemia: A1c 6.8 on 11/02/2019.  Holding home oral hypoglycemics.  Hyperglycemia now worsened by steroids.  Added low-dose Lantus.  Continue SSI.  Adjust insulins as needed.  Hyperlipidemia: Continue home Lipitor.  Former tobacco use disorder Reported that he quit almost 6 months ago.  Congratulated and encouraged continued abstinence.  Body mass index is 25.12 kg/m.   DVT prophylaxis: enoxaparin (LOVENOX) injection 40 mg Start: 04/28/20 1000     Code Status: Full  Code Family Communication: None at bedside Disposition:  Status is: Observation  The patient will require care spanning > 2 midnights and should be moved to inpatient because: Inpatient level of care appropriate due to severity of illness  Dispo:  Patient From: Home  Planned Disposition: Home  Expected discharge date: 04/30/2020  Medically stable for discharge: No    Consultants:   None  Procedures:   None  Antimicrobials:    Anti-infectives (From admission, onward)   Start     Dose/Rate Route Frequency Ordered Stop   04/28/20 2200  azithromycin (ZITHROMAX) tablet 250 mg        250 mg Oral Daily at bedtime 04/28/20 0045 05/02/20 2159   04/27/20 2330  azithromycin (ZITHROMAX) tablet 500 mg        500 mg Oral  Once 04/27/20 2324 04/27/20 2357        Subjective:  Patient was interviewed and examined along with the radial interpreter at bedside.  Reports not feeling significantly better.  During interviewing, having episodes of hacking cough with associated left anterior chest pain/discomfort.  No overt dyspnea.  Able to speak in full sentences when not coughing.  Reports that his symptoms started on 10/23 and worsened the next day prompting ED visit.  Ongoing cough, chest pain with coughing or deep inspiration-left anterior hemithorax and dyspnea with coughing.  No fever or chills reported.  Objective:   Vitals:   04/28/20 0313 04/28/20 0517 04/28/20 0832 04/28/20 1242  BP:  124/83  133/78  Pulse:  89  (!) 103  Resp:  20  19  Temp:  97.9 F (36.6 C)  98.2 F (36.8 C)  TempSrc:  SpO2: 96% 95% 91% 94%  Weight:      Height:        General exam: Pleasant middle-age male, moderately built and nourished lying comfortably propped up in bed without distress except uncomfortable during coughing spells Respiratory system: Harsh breath sounds bilaterally with few bilateral anterior rhonchi.  No crackles. Respiratory effort normal.  Left anterior chest reproducible tenderness  over anterior chest wall. Cardiovascular system: S1 & S2 heard, RRR. No JVD, murmurs, rubs, gallops or clicks. No pedal edema.  Telemetry personally reviewed: SR in the 90's-mild ST in the 100's Gastrointestinal system: Abdomen is nondistended, soft and nontender. No organomegaly or masses felt. Normal bowel sounds heard. Central nervous system: Alert and oriented. No focal neurological deficits. Extremities: Symmetric 5 x 5 power. Skin: No rashes, lesions or ulcers Psychiatry: Judgement and insight appear normal. Mood & affect appropriate.     Data Reviewed:   I have personally reviewed following labs and imaging studies   CBC: Recent Labs  Lab 04/27/20 1736  WBC 9.7  HGB 16.2  HCT 47.3  MCV 93.3  PLT 564    Basic Metabolic Panel: Recent Labs  Lab 04/27/20 1736  NA 134*  K 4.0  CL 99  CO2 24  GLUCOSE 172*  BUN 12  CREATININE 0.83  CALCIUM 9.2    Liver Function Tests: No results for input(s): AST, ALT, ALKPHOS, BILITOT, PROT, ALBUMIN in the last 168 hours.  CBG: Recent Labs  Lab 04/28/20 0754 04/28/20 1241  GLUCAP 252* 332*    Microbiology Studies:   Recent Results (from the past 240 hour(s))  Respiratory Panel by RT PCR (Flu A&B, Covid) - Nasopharyngeal Swab     Status: None   Collection Time: 04/27/20  6:46 PM   Specimen: Nasopharyngeal Swab  Result Value Ref Range Status   SARS Coronavirus 2 by RT PCR NEGATIVE NEGATIVE Final    Comment: (NOTE) SARS-CoV-2 target nucleic acids are NOT DETECTED.  The SARS-CoV-2 RNA is generally detectable in upper respiratoy specimens during the acute phase of infection. The lowest concentration of SARS-CoV-2 viral copies this assay can detect is 131 copies/mL. A negative result does not preclude SARS-Cov-2 infection and should not be used as the sole basis for treatment or other patient management decisions. A negative result may occur with  improper specimen collection/handling, submission of specimen other than  nasopharyngeal swab, presence of viral mutation(s) within the areas targeted by this assay, and inadequate number of viral copies (<131 copies/mL). A negative result must be combined with clinical observations, patient history, and epidemiological information. The expected result is Negative.  Fact Sheet for Patients:  PinkCheek.be  Fact Sheet for Healthcare Providers:  GravelBags.it  This test is no t yet approved or cleared by the [place] FDA and  has been authorized for detection and/or diagnosis of SARS-CoV-2 by FDA under an Emergency Use Authorization (EUA). This EUA will remain  in effect (meaning this test can be used) for the duration of the COVID-19 declaration under Section 564(b)(1) of the Act, 21 U.S.C. section 360bbb-3(b)(1), unless the authorization is terminated or revoked sooner.     Influenza A by PCR NEGATIVE NEGATIVE Final   Influenza B by PCR NEGATIVE NEGATIVE Final    Comment: (NOTE) The Xpert Xpress SARS-CoV-2/FLU/RSV assay is intended as an aid in  the diagnosis of influenza from Nasopharyngeal swab specimens and  should not be used as a sole basis for treatment. Nasal washings and  aspirates are unacceptable for Xpert Xpress SARS-CoV-2/FLU/RSV  testing.  Fact Sheet for Patients: PinkCheek.be  Fact Sheet for Healthcare Providers: GravelBags.it  This test is not yet approved or cleared by the [place] FDA and  has been authorized for detection and/or diagnosis of SARS-CoV-2 by  FDA under an Emergency Use Authorization (EUA). This EUA will remain  in effect (meaning this test can be used) for the duration of the  Covid-19 declaration under Section 564(b)(1) of the Act, 21  U.S.C. section 360bbb-3(b)(1), unless the authorization is  terminated or revoked. Performed at [facility], [place] [address] [place], [place] [address]      Radiology Studies:  DG Chest 2 View  Result Date: 04/27/2020 CLINICAL DATA:  Intermittent chest pain and shortness of breath. EXAM: CHEST - 2 VIEW COMPARISON:  January 23, 2020 FINDINGS: The heart size and mediastinal contours are within normal limits. Both lungs are clear. The visualized skeletal structures are unremarkable. IMPRESSION: No active cardiopulmonary disease. Electronically Signed   By: Virgina Norfolk M.D.   On: 04/27/2020 17:37   ECHOCARDIOGRAM COMPLETE  Result Date: 04/28/2020    ECHOCARDIOGRAM REPORT   Patient Name:   Duane Cuevas Date of Exam: 04/28/2020 Medical Rec #:  [id number]         Height:       68.0 in Accession #:    [id number]        Weight:       165.2 lb Date of Birth:  February 07, 1956         BSA:          1.884 m Patient Age:    70 years          BP:           124/83 mmHg Patient Gender: M                 HR:           100 bpm. Exam Location:  Inpatient Procedure: 2D Echo, Cardiac Doppler and Color Doppler Indications:    R07.9* Chest pain, unspecified  History:        Patient has no prior history of Echocardiogram examinations. CAD                 and Previous Myocardial Infarction, Abnormal ECG, COPD,                 Signs/Symptoms:Chest Pain and Dyspnea; Risk Factors:Current                 Smoker, Diabetes and Dyslipidemia. ETOH.  Sonographer:    Roseanna Rainbow RDCS Referring Phys: [id number] [place]  1. Left ventricular ejection fraction, by estimation, is 60 to 65%. The left ventricle has normal function. The left ventricle has no regional wall motion abnormalities. There is mild concentric left ventricular hypertrophy. Left ventricular diastolic parameters are consistent with Grade I diastolic dysfunction (impaired relaxation).  2. Right ventricular systolic function is normal. The right ventricular size is normal. There is moderately elevated pulmonary artery systolic pressure. The estimated right ventricular systolic  pressure is 65.4 mmHg.  3. The mitral valve is normal in structure. No evidence of mitral valve regurgitation. No evidence of mitral stenosis.  4. The aortic valve is normal in structure. Aortic valve regurgitation is not visualized. No aortic stenosis is present.  5. The inferior vena cava is dilated in size with <50% respiratory variability, suggesting right atrial pressure of 15 mmHg. FINDINGS  Left Ventricle: Left ventricular ejection fraction,  by estimation, is 60 to 65%. The left ventricle has normal function. The left ventricle has no regional wall motion abnormalities. The left ventricular internal cavity size was normal in size. There is  mild concentric left ventricular hypertrophy. Left ventricular diastolic parameters are consistent with Grade I diastolic dysfunction (impaired relaxation). Normal left ventricular filling pressure. Right Ventricle: The right ventricular size is normal. No increase in right ventricular wall thickness. Right ventricular systolic function is normal. There is moderately elevated pulmonary artery systolic pressure. The tricuspid regurgitant velocity is 3.15 m/s, and with an assumed right atrial pressure of 15 mmHg, the estimated right ventricular systolic pressure is 63.8 mmHg. Left Atrium: Left atrial size was normal in size. Right Atrium: Right atrial size was normal in size. Pericardium: There is no evidence of pericardial effusion. Mitral Valve: The mitral valve is normal in structure. No evidence of mitral valve regurgitation. No evidence of mitral valve stenosis. Tricuspid Valve: The tricuspid valve is normal in structure. Tricuspid valve regurgitation is trivial. No evidence of tricuspid stenosis. Aortic Valve: The aortic valve is normal in structure. Aortic valve regurgitation is not visualized. No aortic stenosis is present. Pulmonic Valve: The pulmonic valve was grossly normal. Pulmonic valve regurgitation is not visualized. No evidence of pulmonic stenosis. Aorta:  The aortic root is normal in size and structure. Venous: The inferior vena cava is dilated in size with less than 50% respiratory variability, suggesting right atrial pressure of 15 mmHg. IAS/Shunts: No atrial level shunt detected by color flow Doppler.  LEFT VENTRICLE PLAX 2D LVIDd:         3.50 cm     Diastology LVIDs:         2.20 cm     LV e' medial:    10.30 cm/s LV PW:         1.35 cm     LV E/e' medial:  8.5 LV IVS:        1.43 cm     LV e' lateral:   10.10 cm/s LVOT diam:     1.90 cm     LV E/e' lateral: 8.6 LV SV:         75 LV SV Index:   40 LVOT Area:     2.84 cm  LV Volumes (MOD) LV vol d, MOD A2C: 68.4 ml LV vol d, MOD A4C: 67.0 ml LV vol s, MOD A2C: 17.3 ml LV vol s, MOD A4C: 11.0 ml LV SV MOD A2C:     51.1 ml LV SV MOD A4C:     67.0 ml LV SV MOD BP:      52.4 ml RIGHT VENTRICLE             IVC RV S prime:     15.10 cm/s  IVC diam: 2.50 cm TAPSE (M-mode): 2.5 cm LEFT ATRIUM           Index       RIGHT ATRIUM           Index LA diam:      2.50 cm 1.33 cm/m  RA Area:     17.40 cm LA Vol (A2C): 26.7 ml 14.17 ml/m RA Volume:   48.10 ml  25.53 ml/m LA Vol (A4C): 13.7 ml 7.27 ml/m  AORTIC VALVE LVOT Vmax:   136.00 cm/s LVOT Vmean:  98.800 cm/s LVOT VTI:    0.263 m  AORTA Ao Root diam: 3.70 cm Ao Asc diam:  3.40 cm MITRAL VALVE  TRICUSPID VALVE MV Area (PHT): 4.39 cm     TR Peak grad:   39.7 mmHg MV Decel Time: 173 msec     TR Vmax:        315.00 cm/s MV E velocity: 87.30 cm/s MV A velocity: 116.00 cm/s  SHUNTS MV E/A ratio:  0.75         Systemic VTI:  0.26 m                             Systemic Diam: 1.90 cm Dani Gobble Croitoru MD Electronically signed by Sanda Klein MD Signature Date/Time: 04/28/2020/12:40:37 PM    Final      Scheduled Meds:   . aspirin EC  81 mg Oral Daily  . atorvastatin  20 mg Oral Daily  . azithromycin  250 mg Oral QHS  . benzonatate  200 mg Oral TID  . enoxaparin (LOVENOX) injection  40 mg Subcutaneous Q24H  . guaiFENesin  1,200 mg Oral BID  . insulin  aspart  0-5 Units Subcutaneous QHS  . insulin aspart  0-9 Units Subcutaneous TID WC  . ipratropium-albuterol  3 mL Nebulization Q6H  . methylPREDNISolone (SOLU-MEDROL) injection  60 mg Intravenous Q12H    Continuous Infusions:     LOS: 0 days     Vernell Leep, MD, [place], [facility]. [facility]    To contact the attending provider between 7A-7P or the covering provider during after hours 7P-7A, please log into the web site [email or web address] and access using universal [place] password for that web site. If you do not have the password, please call the hospital operator.  04/28/2020, 3:03 PM

## 2020-04-28 NOTE — Plan of Care (Signed)
  Problem: Education: Goal: Knowledge of General Education information will improve Description: Including pain rating scale, medication(s)/side effects and non-pharmacologic comfort measures Outcome: Progressing   Problem: Health Behavior/Discharge Planning: Goal: Ability to manage health-related needs will improve Outcome: Progressing   Problem: Clinical Measurements: Goal: Ability to maintain clinical measurements within normal limits will improve Outcome: Progressing Goal: Will remain free from infection Outcome: Progressing Goal: Diagnostic test results will improve Outcome: Progressing Goal: Respiratory complications will improve Outcome: Progressing Goal: Cardiovascular complication will be avoided Outcome: Progressing   Problem: Activity: Goal: Risk for activity intolerance will decrease Outcome: Progressing   Problem: Nutrition: Goal: Adequate nutrition will be maintained Outcome: Progressing   Problem: Coping: Goal: Level of anxiety will decrease Outcome: Progressing   Problem: Elimination: Goal: Will not experience complications related to urinary retention Outcome: Progressing   Problem: Pain Managment: Goal: General experience of comfort will improve Outcome: Progressing   Problem: Safety: Goal: Ability to remain free from injury will improve Outcome: Progressing   Problem: Skin Integrity: Goal: Risk for impaired skin integrity will decrease Outcome: Progressing   Problem: Education: Goal: Knowledge of disease or condition will improve Outcome: Progressing Goal: Knowledge of the prescribed therapeutic regimen will improve Outcome: Progressing   Problem: Activity: Goal: Ability to tolerate increased activity will improve Outcome: Progressing Goal: Will verbalize the importance of balancing activity with adequate rest periods Outcome: Progressing   Problem: Respiratory: Goal: Ability to maintain a clear airway will improve Outcome:  Progressing Goal: Levels of oxygenation will improve Outcome: Progressing Goal: Ability to maintain adequate ventilation will improve Outcome: Progressing

## 2020-04-29 DIAGNOSIS — E1165 Type 2 diabetes mellitus with hyperglycemia: Secondary | ICD-10-CM

## 2020-04-29 DIAGNOSIS — R0789 Other chest pain: Secondary | ICD-10-CM

## 2020-04-29 LAB — GLUCOSE, CAPILLARY
Glucose-Capillary: 391 mg/dL — ABNORMAL HIGH (ref 70–99)
Glucose-Capillary: 320 mg/dL — ABNORMAL HIGH (ref 70–99)
Glucose-Capillary: 536 mg/dL (ref 70–99)
Glucose-Capillary: 453 mg/dL — ABNORMAL HIGH (ref 70–99)

## 2020-04-29 LAB — GLUCOSE, RANDOM
Glucose, Bld: 563 mg/dL (ref 70–99)
Glucose, Bld: 472 mg/dL — ABNORMAL HIGH (ref 70–99)

## 2020-04-29 MED ORDER — INSULIN GLARGINE 100 UNIT/ML ~~LOC~~ SOLN
20.0000 [IU] | SUBCUTANEOUS | Status: AC
  Administered 2020-04-29: 20 [IU] via SUBCUTANEOUS
  Filled 2020-04-29: qty 0.2

## 2020-04-29 MED ORDER — INSULIN ASPART 100 UNIT/ML ~~LOC~~ SOLN
20.0000 [IU] | SUBCUTANEOUS | Status: AC
  Administered 2020-04-29: 20 [IU] via SUBCUTANEOUS

## 2020-04-29 MED ORDER — INSULIN ASPART 100 UNIT/ML ~~LOC~~ SOLN
20.0000 [IU] | Freq: Once | SUBCUTANEOUS | Status: AC
  Administered 2020-04-29: 20 [IU] via SUBCUTANEOUS

## 2020-04-29 MED ORDER — INSULIN ASPART 100 UNIT/ML ~~LOC~~ SOLN
0.0000 [IU] | Freq: Three times a day (TID) | SUBCUTANEOUS | Status: DC
  Administered 2020-04-29: 15 [IU] via SUBCUTANEOUS

## 2020-04-29 MED ORDER — INSULIN ASPART 100 UNIT/ML ~~LOC~~ SOLN
0.0000 [IU] | SUBCUTANEOUS | Status: DC
  Administered 2020-04-30 (×2): 4 [IU] via SUBCUTANEOUS

## 2020-04-29 MED ORDER — INSULIN GLARGINE 100 UNIT/ML ~~LOC~~ SOLN
20.0000 [IU] | Freq: Every day | SUBCUTANEOUS | Status: DC
  Administered 2020-04-29 – 2020-04-30 (×2): 20 [IU] via SUBCUTANEOUS
  Filled 2020-04-29 (×2): qty 0.2

## 2020-04-29 MED ORDER — PREDNISONE 20 MG PO TABS
40.0000 mg | ORAL_TABLET | Freq: Every day | ORAL | Status: DC
  Administered 2020-04-30: 40 mg via ORAL
  Filled 2020-04-29: qty 2

## 2020-04-29 NOTE — Progress Notes (Signed)
PROGRESS NOTE    Duane Cuevas   WNU:[id number]  DOB: 04-Nov-1955  DOA: 04/27/2020     1  PCP: [name], MD  CC: [facility] Course: Patient is a 64  yo male with PMH DMII, COPD, hepatic steatosis, thrombocytopenia, prior tobacco use quit approximately 6 months ago who presented to the ER with worsening shortness of breath, increase in sputum and change in color. He was admitted for COPD exacerbation.  He was started on nebulizers, steroids, and azithromycin.   Interval History:  Admitted with SOB. Overall seems to be feeling a little better this morning. Less wheezing and cough has improved   Old records reviewed in assessment of this patient  ROS: Constitutional: negative for chills and fevers, Respiratory: positive for cough, Cardiovascular: negative for chest pain and Gastrointestinal: negative for abdominal pain  Assessment & Plan: * Acute exacerbation of chronic obstructive pulmonary disease (COPD) (HCC) -Dyspnea, increase in cough, change in sputum color -Negative Covid and flu swab -Continue nebulizers, Solu-Medrol, and azithromycin  Musculoskeletal chest pain Secondary to intractable cough.  Reproducible tenderness. HS troponin x2: Negative.  D-dimer negative.  Address cough as above.  As needed Tylenol.  TTE: LVEF 03-47%, grade 1 diastolic dysfunction  Type 2 diabetes mellitus with hyperglycemia, without long-term current use of insulin (HCC) A1c 6.8 on 11/02/2019.  Holding home oral hypoglycemics.  Hyperglycemia now worsened by steroids.  - Continue SSI.  Adjust insulins as needed. -Lantus increased to 20 units daily  Hyperlipidemia - continue statin    Antimicrobials: Azithro  DVT prophylaxis: Lovenox Code Status: Full Family Communication: none present Disposition Plan: Status is: Inpatient  Remains inpatient appropriate because:Unsafe d/c plan, IV treatments appropriate due to intensity of illness or inability to take PO and  Inpatient level of care appropriate due to severity of illness   Dispo:  Patient From: Home  Planned Disposition: Home  Expected discharge date: 04/30/20  Medically stable for discharge: No        Objective: Blood pressure 117/85, pulse 94, temperature 98 F (36.7 C), resp. rate 18, height 5\' 8"  (1.727 m), weight 74.9 kg, SpO2 94 %.  Examination: General appearance: alert, cooperative and no distress Head: Normocephalic, without obvious abnormality, atraumatic Eyes: EOMI Lungs: coarse breath sounds bilaterally, mild wheezing Heart: regular rate and rhythm and S1, S2 normal Abdomen: soft, NT, ND, BS present Extremities: no edema Skin: mobility and turgor normal Neurologic: Grossly normal  Consultants:   none  Procedures:   none  Data Reviewed: I have personally reviewed following labs and imaging studies Results for orders placed or performed during the hospital encounter of 04/27/20 (from the past 24 hour(s))  Glucose, capillary     Status: Abnormal   Collection Time: 04/28/20  4:57 PM  Result Value Ref Range   Glucose-Capillary 256 (H) 70 - 99 mg/dL  Glucose, capillary     Status: Abnormal   Collection Time: 04/28/20  8:46 PM  Result Value Ref Range   Glucose-Capillary 437 (H) 70 - 99 mg/dL  Glucose, random     Status: Abnormal   Collection Time: 04/28/20  9:18 PM  Result Value Ref Range   Glucose, Bld 447 (H) 70 - 99 mg/dL  Glucose, capillary     Status: Abnormal   Collection Time: 04/29/20  7:35 AM  Result Value Ref Range   Glucose-Capillary 391 (H) 70 - 99 mg/dL  Glucose, capillary     Status: Abnormal   Collection Time: 04/29/20 11:48 AM  Result Value  Ref Range   Glucose-Capillary 320 (H) 70 - 99 mg/dL    Recent Results (from the past 240 hour(s))  Respiratory Panel by RT PCR (Flu A&B, Covid) - Nasopharyngeal Swab     Status: None   Collection Time: 04/27/20  6:46 PM   Specimen: Nasopharyngeal Swab  Result Value Ref Range Status   SARS Coronavirus  2 by RT PCR NEGATIVE NEGATIVE Final    Comment: (NOTE) SARS-CoV-2 target nucleic acids are NOT DETECTED.  The SARS-CoV-2 RNA is generally detectable in upper respiratoy specimens during the acute phase of infection. The lowest concentration of SARS-CoV-2 viral copies this assay can detect is 131 copies/mL. A negative result does not preclude SARS-Cov-2 infection and should not be used as the sole basis for treatment or other patient management decisions. A negative result may occur with  improper specimen collection/handling, submission of specimen other than nasopharyngeal swab, presence of viral mutation(s) within the areas targeted by this assay, and inadequate number of viral copies (<131 copies/mL). A negative result must be combined with clinical observations, patient history, and epidemiological information. The expected result is Negative.  Fact Sheet for Patients:  PinkCheek.be  Fact Sheet for Healthcare Providers:  GravelBags.it  This test is no t yet approved or cleared by the [place] FDA and  has been authorized for detection and/or diagnosis of SARS-CoV-2 by FDA under an Emergency Use Authorization (EUA). This EUA will remain  in effect (meaning this test can be used) for the duration of the COVID-19 declaration under Section 564(b)(1) of the Act, 21 U.S.C. section 360bbb-3(b)(1), unless the authorization is terminated or revoked sooner.     Influenza A by PCR NEGATIVE NEGATIVE Final   Influenza B by PCR NEGATIVE NEGATIVE Final    Comment: (NOTE) The Xpert Xpress SARS-CoV-2/FLU/RSV assay is intended as an aid in  the diagnosis of influenza from Nasopharyngeal swab specimens and  should not be used as a sole basis for treatment. Nasal washings and  aspirates are unacceptable for Xpert Xpress SARS-CoV-2/FLU/RSV  testing.  Fact Sheet for Patients: PinkCheek.be  Fact Sheet  for Healthcare Providers: GravelBags.it  This test is not yet approved or cleared by the [place] FDA and  has been authorized for detection and/or diagnosis of SARS-CoV-2 by  FDA under an Emergency Use Authorization (EUA). This EUA will remain  in effect (meaning this test can be used) for the duration of the  Covid-19 declaration under Section 564(b)(1) of the Act, 21  U.S.C. section 360bbb-3(b)(1), unless the authorization is  terminated or revoked. Performed at [facility], [place] [address], [place], [place] [address]      Radiology Studies: DG Chest 2 View  Result Date: 04/27/2020 CLINICAL DATA:  Intermittent chest pain and shortness of breath. EXAM: CHEST - 2 VIEW COMPARISON:  January 23, 2020 FINDINGS: The heart size and mediastinal contours are within normal limits. Both lungs are clear. The visualized skeletal structures are unremarkable. IMPRESSION: No active cardiopulmonary disease. Electronically Signed   By: Virgina Norfolk M.D.   On: 04/27/2020 17:37   ECHOCARDIOGRAM COMPLETE  Result Date: 04/28/2020    ECHOCARDIOGRAM REPORT   Patient Name:   Duane Cuevas Date of Exam: 04/28/2020 Medical Rec #:  [id number]         Height:       68.0 in Accession #:    [id number]        Weight:       165.2 lb Date of Birth:  July 12, 1955  BSA:          1.884 m Patient Age:    5 years          BP:           124/83 mmHg Patient Gender: M                 HR:           100 bpm. Exam Location:  Inpatient Procedure: 2D Echo, Cardiac Doppler and Color Doppler Indications:    R07.9* Chest pain, unspecified  History:        Patient has no prior history of Echocardiogram examinations. CAD                 and Previous Myocardial Infarction, Abnormal ECG, COPD,                 Signs/Symptoms:Chest Pain and Dyspnea; Risk Factors:Current                 Smoker, Diabetes and Dyslipidemia. ETOH.  Sonographer:    Roseanna Rainbow RDCS Referring Phys: [id number]  [place]  1. Left ventricular ejection fraction, by estimation, is 60 to 65%. The left ventricle has normal function. The left ventricle has no regional wall motion abnormalities. There is mild concentric left ventricular hypertrophy. Left ventricular diastolic parameters are consistent with Grade I diastolic dysfunction (impaired relaxation).  2. Right ventricular systolic function is normal. The right ventricular size is normal. There is moderately elevated pulmonary artery systolic pressure. The estimated right ventricular systolic pressure is 22.0 mmHg.  3. The mitral valve is normal in structure. No evidence of mitral valve regurgitation. No evidence of mitral stenosis.  4. The aortic valve is normal in structure. Aortic valve regurgitation is not visualized. No aortic stenosis is present.  5. The inferior vena cava is dilated in size with <50% respiratory variability, suggesting right atrial pressure of 15 mmHg. FINDINGS  Left Ventricle: Left ventricular ejection fraction, by estimation, is 60 to 65%. The left ventricle has normal function. The left ventricle has no regional wall motion abnormalities. The left ventricular internal cavity size was normal in size. There is  mild concentric left ventricular hypertrophy. Left ventricular diastolic parameters are consistent with Grade I diastolic dysfunction (impaired relaxation). Normal left ventricular filling pressure. Right Ventricle: The right ventricular size is normal. No increase in right ventricular wall thickness. Right ventricular systolic function is normal. There is moderately elevated pulmonary artery systolic pressure. The tricuspid regurgitant velocity is 3.15 m/s, and with an assumed right atrial pressure of 15 mmHg, the estimated right ventricular systolic pressure is 25.4 mmHg. Left Atrium: Left atrial size was normal in size. Right Atrium: Right atrial size was normal in size. Pericardium: There is no evidence of pericardial  effusion. Mitral Valve: The mitral valve is normal in structure. No evidence of mitral valve regurgitation. No evidence of mitral valve stenosis. Tricuspid Valve: The tricuspid valve is normal in structure. Tricuspid valve regurgitation is trivial. No evidence of tricuspid stenosis. Aortic Valve: The aortic valve is normal in structure. Aortic valve regurgitation is not visualized. No aortic stenosis is present. Pulmonic Valve: The pulmonic valve was grossly normal. Pulmonic valve regurgitation is not visualized. No evidence of pulmonic stenosis. Aorta: The aortic root is normal in size and structure. Venous: The inferior vena cava is dilated in size with less than 50% respiratory variability, suggesting right atrial pressure of 15 mmHg. IAS/Shunts: No atrial level shunt detected by color flow Doppler.  LEFT VENTRICLE PLAX 2D LVIDd:         3.50 cm     Diastology LVIDs:         2.20 cm     LV e' medial:    10.30 cm/s LV PW:         1.35 cm     LV E/e' medial:  8.5 LV IVS:        1.43 cm     LV e' lateral:   10.10 cm/s LVOT diam:     1.90 cm     LV E/e' lateral: 8.6 LV SV:         75 LV SV Index:   40 LVOT Area:     2.84 cm  LV Volumes (MOD) LV vol d, MOD A2C: 68.4 ml LV vol d, MOD A4C: 67.0 ml LV vol s, MOD A2C: 17.3 ml LV vol s, MOD A4C: 11.0 ml LV SV MOD A2C:     51.1 ml LV SV MOD A4C:     67.0 ml LV SV MOD BP:      52.4 ml RIGHT VENTRICLE             IVC RV S prime:     15.10 cm/s  IVC diam: 2.50 cm TAPSE (M-mode): 2.5 cm LEFT ATRIUM           Index       RIGHT ATRIUM           Index LA diam:      2.50 cm 1.33 cm/m  RA Area:     17.40 cm LA Vol (A2C): 26.7 ml 14.17 ml/m RA Volume:   48.10 ml  25.53 ml/m LA Vol (A4C): 13.7 ml 7.27 ml/m  AORTIC VALVE LVOT Vmax:   136.00 cm/s LVOT Vmean:  98.800 cm/s LVOT VTI:    0.263 m  AORTA Ao Root diam: 3.70 cm Ao Asc diam:  3.40 cm MITRAL VALVE                TRICUSPID VALVE MV Area (PHT): 4.39 cm     TR Peak grad:   39.7 mmHg MV Decel Time: 173 msec     TR Vmax:         315.00 cm/s MV E velocity: 87.30 cm/s MV A velocity: 116.00 cm/s  SHUNTS MV E/A ratio:  0.75         Systemic VTI:  0.26 m                             Systemic Diam: 1.90 cm Dani Gobble Croitoru MD Electronically signed by Sanda Klein MD Signature Date/Time: 04/28/2020/12:40:37 PM    Final    DG Chest 2 View  Final Result      Scheduled Meds: . aspirin EC  81 mg Oral Daily  . atorvastatin  20 mg Oral Daily  . azithromycin  250 mg Oral QHS  . benzonatate  200 mg Oral TID  . enoxaparin (LOVENOX) injection  40 mg Subcutaneous Q24H  . guaiFENesin  1,200 mg Oral BID  . insulin aspart  0-20 Units Subcutaneous TID WC  . insulin aspart  0-5 Units Subcutaneous QHS  . insulin glargine  20 Units Subcutaneous Daily  . ipratropium-albuterol  3 mL Nebulization Q6H  . methylPREDNISolone (SOLU-MEDROL) injection  60 mg Intravenous Q12H  . [START ON 04/30/2020] predniSONE  40 mg Oral Q breakfast   PRN Meds: acetaminophen, albuterol, guaiFENesin-dextromethorphan, morphine  injection Continuous Infusions:   LOS: 1 day  Time spent: Greater than 50% of the 35 minute visit was spent in counseling/coordination of care for the patient as laid out in the A&P.   Dwyane Dee, MD [facility] 04/29/2020, 3:38 PM

## 2020-04-29 NOTE — Assessment & Plan Note (Signed)
Secondary to intractable cough.  Reproducible tenderness. HS troponin x2: Negative.  D-dimer negative.  Address cough as above.  As needed Tylenol.  TTE: LVEF 79-55%, grade 1 diastolic dysfunction

## 2020-04-29 NOTE — Hospital Course (Addendum)
Patient is a 64  yo male with PMH DMII, COPD, hepatic steatosis, thrombocytopenia, prior tobacco use quit approximately 6 months ago who presented to the ER with worsening shortness of breath, increase in sputum and change in color. He was admitted for COPD exacerbation.  He was started on nebulizers, steroids, and azithromycin.  He responded well to treatment and improved rapidly over 1 to 2 days.  He was given a prescription to finish prednisone and azithromycin at discharge.  He did require increased doses of insulin due to hyperglycemia from steroids during hospitalization, therefore was given a short prednisone prescription at discharge.

## 2020-04-29 NOTE — Assessment & Plan Note (Addendum)
A1c 6.8 on 11/02/2019.  Holding home oral hypoglycemics.  Hyperglycemia now worsened by steroids.  - Continue SSI.  Adjust insulins as needed. -Lantus increased to 20 units daily -Patient was on Solu-Medrol in hospital and discharged on very short course of prednisone; glucose levels expected to remain high for a couple more days of discharge until finishes prednisone, patient understands to continue checking glucose as well.

## 2020-04-29 NOTE — Assessment & Plan Note (Signed)
-  Dyspnea, increase in cough, change in sputum color -Negative Covid and flu swab -Continue nebulizers, Solu-Medrol, and azithromycin

## 2020-04-29 NOTE — Assessment & Plan Note (Signed)
continue statin

## 2020-04-29 NOTE — Progress Notes (Signed)
CRITICAL VALUE ALERT  Critical Value:  Glucose 563  Date & Time Notied:  04/29/2020 1824  Provider Notified: Dr. Sabino Gasser  Orders Received/Actions taken: New orders received and followed

## 2020-04-30 LAB — GLUCOSE, CAPILLARY
Glucose-Capillary: 162 mg/dL — ABNORMAL HIGH (ref 70–99)
Glucose-Capillary: 171 mg/dL — ABNORMAL HIGH (ref 70–99)
Glucose-Capillary: 198 mg/dL — ABNORMAL HIGH (ref 70–99)

## 2020-04-30 LAB — CBC WITH DIFFERENTIAL/PLATELET
Abs Immature Granulocytes: 0.18 10*3/uL — ABNORMAL HIGH (ref 0.00–0.07)
Basophils Absolute: 0 10*3/uL (ref 0.0–0.1)
Basophils Relative: 0 %
Eosinophils Absolute: 0 10*3/uL (ref 0.0–0.5)
Eosinophils Relative: 0 %
HCT: 44 % (ref 39.0–52.0)
Hemoglobin: 14.9 g/dL (ref 13.0–17.0)
Immature Granulocytes: 1 %
Lymphocytes Relative: 3 %
Lymphs Abs: 0.6 10*3/uL — ABNORMAL LOW (ref 0.7–4.0)
MCH: 32.2 pg (ref 26.0–34.0)
MCHC: 33.9 g/dL (ref 30.0–36.0)
MCV: 95 fL (ref 80.0–100.0)
Monocytes Absolute: 0.7 10*3/uL (ref 0.1–1.0)
Monocytes Relative: 4 %
Neutro Abs: 17.6 10*3/uL — ABNORMAL HIGH (ref 1.7–7.7)
Neutrophils Relative %: 92 %
Platelets: 234 10*3/uL (ref 150–400)
RBC: 4.63 MIL/uL (ref 4.22–5.81)
RDW: 11.6 % (ref 11.5–15.5)
WBC: 19.1 10*3/uL — ABNORMAL HIGH (ref 4.0–10.5)
nRBC: 0 % (ref 0.0–0.2)

## 2020-04-30 LAB — BASIC METABOLIC PANEL
Anion gap: 10 (ref 5–15)
BUN: 29 mg/dL — ABNORMAL HIGH (ref 8–23)
CO2: 26 mmol/L (ref 22–32)
Calcium: 9.3 mg/dL (ref 8.9–10.3)
Chloride: 103 mmol/L (ref 98–111)
Creatinine, Ser: 1.01 mg/dL (ref 0.61–1.24)
GFR, Estimated: >60 mL/min (ref >60)
Glucose, Bld: 154 mg/dL — ABNORMAL HIGH (ref 70–99)
Potassium: 4.4 mmol/L (ref 3.5–5.1)
Sodium: 139 mmol/L (ref 135–145)

## 2020-04-30 LAB — MAGNESIUM: Magnesium: 2.4 mg/dL (ref 1.7–2.4)

## 2020-04-30 MED ORDER — PREDNISONE 20 MG PO TABS: 40.0000 mg | ORAL_TABLET | Freq: Every day | ORAL | 0 refills | Status: AC

## 2020-04-30 MED ORDER — INSULIN ASPART 100 UNIT/ML ~~LOC~~ SOLN
0.0000 [IU] | Freq: Three times a day (TID) | SUBCUTANEOUS | Status: DC
  Administered 2020-04-30: 4 [IU] via SUBCUTANEOUS

## 2020-04-30 MED ORDER — AZITHROMYCIN 250 MG PO TABS: 250.0000 mg | ORAL_TABLET | Freq: Every day | ORAL | 0 refills | Status: AC

## 2020-04-30 MED ORDER — IPRATROPIUM-ALBUTEROL 0.5-2.5 (3) MG/3ML IN SOLN: 3.0000 mL | Freq: Three times a day (TID) | RESPIRATORY_TRACT | Status: DC

## 2020-04-30 NOTE — Discharge Summary (Signed)
Physician Discharge Summary   [name] IWL:[id number] DOB: [birth year] DOA: 04/27/2020  PCP: [name], MD  Admit date: 04/27/2020 Discharge date: 04/30/2020  Admitted From: Home Disposition:  home Discharging physician: Dwyane Dee, MD  Recommendations for Outpatient Follow-up:  1. Continue routine care  Patient discharged to home in Discharge Condition: stable CODE STATUS: Full Diet recommendation:  Diet Orders (From admission, onward)    Start     Ordered   04/30/20 0000  Diet - low sodium heart healthy        04/30/20 1011   04/30/20 0000  Diet Carb Modified        04/30/20 1011   04/28/20 0047  Diet heart healthy/carb modified Room service appropriate? Yes; Fluid consistency: Thin  Diet effective now       Question Answer Comment  Diet-HS Snack? Nothing   Room service appropriate? Yes   Fluid consistency: Thin      04/28/20 0046          Hospital Course: Patient is a 64  yo male with PMH DMII, COPD, hepatic steatosis, thrombocytopenia, prior tobacco use quit approximately 6 months ago who presented to the ER with worsening shortness of breath, increase in sputum and change in color. He was admitted for COPD exacerbation.  He was started on nebulizers, steroids, and azithromycin.  He responded well to treatment and improved rapidly over 1 to 2 days.  He was given a prescription to finish prednisone and azithromycin at discharge.  He did require increased doses of insulin due to hyperglycemia from steroids during hospitalization, therefore was given a short prednisone prescription at discharge.   * Acute exacerbation of chronic obstructive pulmonary disease (COPD) (HCC) -Dyspnea, increase in cough, change in sputum color -Negative Covid and flu swab -Continue nebulizers, Solu-Medrol, and azithromycin  Musculoskeletal chest pain Secondary to intractable cough.  Reproducible tenderness. HS troponin x2: Negative.  D-dimer negative.  Address  cough as above.  As needed Tylenol.  TTE: LVEF 17-40%, grade 1 diastolic dysfunction  Type 2 diabetes mellitus with hyperglycemia, without long-term current use of insulin (HCC) A1c 6.8 on 11/02/2019.  Holding home oral hypoglycemics.  Hyperglycemia now worsened by steroids.  - Continue SSI.  Adjust insulins as needed. -Lantus increased to 20 units daily -Patient was on Solu-Medrol in hospital and discharged on very short course of prednisone; glucose levels expected to remain high for a couple more days of discharge until finishes prednisone, patient understands to continue checking glucose as well.  Hyperlipidemia - continue statin    The patient's chronic medical conditions were treated accordingly per the patient's home medication regimen except as noted.  On day of discharge, patient was felt deemed stable for discharge. Patient/family member advised to call PCP or come back to ER if needed.   Principal Diagnosis: Acute exacerbation of chronic obstructive pulmonary disease (COPD) ([name])  Discharge Diagnoses: Active Hospital Problems   Diagnosis Date Noted  . Acute exacerbation of chronic obstructive pulmonary disease (COPD) ([name]) 04/27/2020    Priority: High  . Musculoskeletal chest pain 04/29/2020    Priority: Medium  . Type 2 diabetes mellitus with hyperglycemia, without long-term current use of insulin ([place]) 10/14/2014    Priority: Low  . Hyperlipidemia 11/02/2019    Resolved Hospital Problems  No resolved problems to display.    Discharge Instructions    Diet - low sodium heart healthy   Complete by: As directed    Diet Carb Modified   Complete by: As directed  Increase activity slowly   Complete by: As directed      Allergies as of 04/30/2020   No Known Allergies     Medication List    STOP taking these medications   predniSONE 10 MG (21) Tbpk tablet Commonly known as: STERAPRED UNI-PAK 21 TAB Replaced by: predniSONE 20 MG tablet     TAKE these  medications   acetaminophen 500 MG tablet Commonly known as: TYLENOL Take 500 mg by mouth every 6 (six) hours as needed.   albuterol (2.5 MG/3ML) 0.083% nebulizer solution Commonly known as: PROVENTIL Take 3 mLs (2.5 mg total) by nebulization every 6 (six) hours as needed for wheezing or shortness of breath. What changed: additional instructions   aspirin 81 MG EC tablet Take 1 tablet (81 mg total) by mouth daily.   atorvastatin 20 MG tablet Commonly known as: LIPITOR Take 1 tablet (20 mg total) by mouth daily.   azithromycin 250 MG tablet Commonly known as: ZITHROMAX Take 1 tablet (250 mg total) by mouth daily for 2 days.   budesonide-formoterol 160-4.5 MCG/ACT inhaler Commonly known as: Symbicort Inhale 2 puffs into the lungs 2 (two) times daily.   glipiZIDE 5 MG tablet Commonly known as: GLUCOTROL Take 1 tablet (5 mg total) by mouth daily with breakfast.   metFORMIN 500 MG tablet Commonly known as: GLUCOPHAGE Take 1 tablet (500 mg total) by mouth 2 (two) times daily with a meal.   nicotine 14 mg/24hr patch Commonly known as: NICODERM CQ - dosed in mg/24 hours Place 1 patch (14 mg total) onto the skin daily.   predniSONE 20 MG tablet Commonly known as: DELTASONE Take 2 tablets (40 mg total) by mouth daily with breakfast for 2 days. Start taking on: May 01, 2020 Replaces: predniSONE 10 MG (21) Tbpk tablet       No Known Allergies  Consultations: none  Discharge Exam: BP 118/81 (BP Location: Left Arm)   Pulse 82   Temp 98 F (36.7 C) (Oral)   Resp 20   Ht 5\' 8"  (1.727 m)   Wt 74.9 kg   SpO2 96%   BMI 25.12 kg/m  General appearance: alert, cooperative and no distress Head: Normocephalic, without obvious abnormality, atraumatic Eyes: EOMI Lungs: coarse breath sounds bilaterally, no further wheezing Heart: regular rate and rhythm and S1, S2 normal Abdomen: soft, NT, ND, BS present Extremities: no edema Skin: mobility and turgor normal Neurologic:  Grossly normal  The results of significant diagnostics from this hospitalization (including imaging, microbiology, ancillary and laboratory) are listed below for reference.   Microbiology: Recent Results (from the past 240 hour(s))  Respiratory Panel by RT PCR (Flu A&B, Covid) - Nasopharyngeal Swab     Status: None   Collection Time: 04/27/20  6:46 PM   Specimen: Nasopharyngeal Swab  Result Value Ref Range Status   SARS Coronavirus 2 by RT PCR NEGATIVE NEGATIVE Final    Comment: (NOTE) SARS-CoV-2 target nucleic acids are NOT DETECTED.  The SARS-CoV-2 RNA is generally detectable in upper respiratoy specimens during the acute phase of infection. The lowest concentration of SARS-CoV-2 viral copies this assay can detect is 131 copies/mL. A negative result does not preclude SARS-Cov-2 infection and should not be used as the sole basis for treatment or other patient management decisions. A negative result may occur with  improper specimen collection/handling, submission of specimen other than nasopharyngeal swab, presence of viral mutation(s) within the areas targeted by this assay, and inadequate number of viral copies (<131 copies/mL). A negative  result must be combined with clinical observations, patient history, and epidemiological information. The expected result is Negative.  Fact Sheet for Patients:  PinkCheek.be  Fact Sheet for Healthcare Providers:  GravelBags.it  This test is no t yet approved or cleared by the [place] FDA and  has been authorized for detection and/or diagnosis of SARS-CoV-2 by FDA under an Emergency Use Authorization (EUA). This EUA will remain  in effect (meaning this test can be used) for the duration of the COVID-19 declaration under Section 564(b)(1) of the Act, 21 U.S.C. section 360bbb-3(b)(1), unless the authorization is terminated or revoked sooner.     Influenza A by PCR NEGATIVE  NEGATIVE Final   Influenza B by PCR NEGATIVE NEGATIVE Final    Comment: (NOTE) The Xpert Xpress SARS-CoV-2/FLU/RSV assay is intended as an aid in  the diagnosis of influenza from Nasopharyngeal swab specimens and  should not be used as a sole basis for treatment. Nasal washings and  aspirates are unacceptable for Xpert Xpress SARS-CoV-2/FLU/RSV  testing.  Fact Sheet for Patients: PinkCheek.be  Fact Sheet for Healthcare Providers: GravelBags.it  This test is not yet approved or cleared by the [place] FDA and  has been authorized for detection and/or diagnosis of SARS-CoV-2 by  FDA under an Emergency Use Authorization (EUA). This EUA will remain  in effect (meaning this test can be used) for the duration of the  Covid-19 declaration under Section 564(b)(1) of the Act, 21  U.S.C. section 360bbb-3(b)(1), unless the authorization is  terminated or revoked. Performed at [facility], [place] [address], [place], [place] [address]      Labs: BNP (last 3 results) Recent Labs    01/22/20 1058  BNP 30.8   Basic Metabolic Panel: Recent Labs  Lab 04/27/20 1736 04/28/20 2118 04/29/20 1705 04/29/20 2029 04/30/20 0358  NA 134*  --   --   --  139  K 4.0  --   --   --  4.4  CL 99  --   --   --  103  CO2 24  --   --   --  26  GLUCOSE 172* 447* 563* 472* 154*  BUN 12  --   --   --  29*  CREATININE 0.83  --   --   --  1.01  CALCIUM 9.2  --   --   --  9.3  MG  --   --   --   --  2.4   Liver Function Tests: No results for input(s): AST, ALT, ALKPHOS, BILITOT, PROT, ALBUMIN in the last 168 hours. No results for input(s): LIPASE, AMYLASE in the last 168 hours. No results for input(s): AMMONIA in the last 168 hours. CBC: Recent Labs  Lab 04/27/20 1736 04/30/20 0358  WBC 9.7 19.1*  NEUTROABS  --  17.6*  HGB 16.2 14.9  HCT 47.3 44.0  MCV 93.3 95.0  PLT 245 234   Cardiac Enzymes: No results for  input(s): CKTOTAL, CKMB, CKMBINDEX, TROPONINI in the last 168 hours. BNP: Invalid input(s): POCBNP CBG: Recent Labs  Lab 04/29/20 1648 04/29/20 2001 04/30/20 0029 04/30/20 0416 04/30/20 0753  GLUCAP 536* 453* 198* 162* 171*   D-Dimer Recent Labs    04/27/20 2204  DDIMER 0.36   Hgb A1c Recent Labs    04/28/20 0100  HGBA1C 8.9*   Lipid Profile No results for input(s): CHOL, HDL, LDLCALC, TRIG, CHOLHDL, LDLDIRECT in the last 72 hours. Thyroid function studies No results for input(s):  TSH, T4TOTAL, T3FREE, THYROIDAB in the last 72 hours.  Invalid input(s): FREET3 Anemia work up No results for input(s): VITAMINB12, FOLATE, FERRITIN, TIBC, IRON, RETICCTPCT in the last 72 hours. Urinalysis    Component Value Date/Time   COLORURINE YELLOW 09/23/2015 0616   APPEARANCEUR CLEAR 09/23/2015 0616   LABSPEC 1.019 09/23/2015 0616   PHURINE 7.0 09/23/2015 0616   GLUCOSEU NEGATIVE 09/23/2015 0616   HGBUR TRACE (A) 09/23/2015 0616   BILIRUBINUR NEGATIVE 09/23/2015 0616   KETONESUR NEGATIVE 09/23/2015 0616   PROTEINUR 100 (A) 09/23/2015 0616   UROBILINOGEN 0.2 10/14/2014 1712   NITRITE NEGATIVE 09/23/2015 0616   LEUKOCYTESUR NEGATIVE 09/23/2015 0616   Sepsis Labs Invalid input(s): PROCALCITONIN,  WBC,  LACTICIDVEN Microbiology Recent Results (from the past 240 hour(s))  Respiratory Panel by RT PCR (Flu A&B, Covid) - Nasopharyngeal Swab     Status: None   Collection Time: 04/27/20  6:46 PM   Specimen: Nasopharyngeal Swab  Result Value Ref Range Status   SARS Coronavirus 2 by RT PCR NEGATIVE NEGATIVE Final    Comment: (NOTE) SARS-CoV-2 target nucleic acids are NOT DETECTED.  The SARS-CoV-2 RNA is generally detectable in upper respiratoy specimens during the acute phase of infection. The lowest concentration of SARS-CoV-2 viral copies this assay can detect is 131 copies/mL. A negative result does not preclude SARS-Cov-2 infection and should not be used as the sole basis for  treatment or other patient management decisions. A negative result may occur with  improper specimen collection/handling, submission of specimen other than nasopharyngeal swab, presence of viral mutation(s) within the areas targeted by this assay, and inadequate number of viral copies (<131 copies/mL). A negative result must be combined with clinical observations, patient history, and epidemiological information. The expected result is Negative.  Fact Sheet for Patients:  PinkCheek.be  Fact Sheet for Healthcare Providers:  GravelBags.it  This test is no t yet approved or cleared by the [place] FDA and  has been authorized for detection and/or diagnosis of SARS-CoV-2 by FDA under an Emergency Use Authorization (EUA). This EUA will remain  in effect (meaning this test can be used) for the duration of the COVID-19 declaration under Section 564(b)(1) of the Act, 21 U.S.C. section 360bbb-3(b)(1), unless the authorization is terminated or revoked sooner.     Influenza A by PCR NEGATIVE NEGATIVE Final   Influenza B by PCR NEGATIVE NEGATIVE Final    Comment: (NOTE) The Xpert Xpress SARS-CoV-2/FLU/RSV assay is intended as an aid in  the diagnosis of influenza from Nasopharyngeal swab specimens and  should not be used as a sole basis for treatment. Nasal washings and  aspirates are unacceptable for Xpert Xpress SARS-CoV-2/FLU/RSV  testing.  Fact Sheet for Patients: PinkCheek.be  Fact Sheet for Healthcare Providers: GravelBags.it  This test is not yet approved or cleared by the [place] FDA and  has been authorized for detection and/or diagnosis of SARS-CoV-2 by  FDA under an Emergency Use Authorization (EUA). This EUA will remain  in effect (meaning this test can be used) for the duration of the  Covid-19 declaration under Section 564(b)(1) of the Act, 21    U.S.C. section 360bbb-3(b)(1), unless the authorization is  terminated or revoked. Performed at [facility], [place] [address], [place], [place] [address]     Procedures/Studies: DG Chest 2 View  Result Date: 04/27/2020 CLINICAL DATA:  Intermittent chest pain and shortness of breath. EXAM: CHEST - 2 VIEW COMPARISON:  January 23, 2020 FINDINGS: The heart size and mediastinal  contours are within normal limits. Both lungs are clear. The visualized skeletal structures are unremarkable. IMPRESSION: No active cardiopulmonary disease. Electronically Signed   By: Virgina Norfolk M.D.   On: 04/27/2020 17:37   ECHOCARDIOGRAM COMPLETE  Result Date: 04/28/2020    ECHOCARDIOGRAM REPORT   Patient Name:   NAZAIRE CORDIAL Date of Exam: 04/28/2020 Medical Rec #:  [id number]         Height:       68.0 in Accession #:    [id number]        Weight:       165.2 lb Date of Birth:  May 12, 1956         BSA:          1.884 m Patient Age:    64 years          BP:           124/83 mmHg Patient Gender: M                 HR:           100 bpm. Exam Location:  Inpatient Procedure: 2D Echo, Cardiac Doppler and Color Doppler Indications:    R07.9* Chest pain, unspecified  History:        Patient has no prior history of Echocardiogram examinations. CAD                 and Previous Myocardial Infarction, Abnormal ECG, COPD,                 Signs/Symptoms:Chest Pain and Dyspnea; Risk Factors:Current                 Smoker, Diabetes and Dyslipidemia. ETOH.  Sonographer:    Roseanna Rainbow RDCS Referring Phys: [id number] [place]  1. Left ventricular ejection fraction, by estimation, is 60 to 65%. The left ventricle has normal function. The left ventricle has no regional wall motion abnormalities. There is mild concentric left ventricular hypertrophy. Left ventricular diastolic parameters are consistent with Grade I diastolic dysfunction (impaired relaxation).  2. Right ventricular systolic function is normal.  The right ventricular size is normal. There is moderately elevated pulmonary artery systolic pressure. The estimated right ventricular systolic pressure is 99.2 mmHg.  3. The mitral valve is normal in structure. No evidence of mitral valve regurgitation. No evidence of mitral stenosis.  4. The aortic valve is normal in structure. Aortic valve regurgitation is not visualized. No aortic stenosis is present.  5. The inferior vena cava is dilated in size with <50% respiratory variability, suggesting right atrial pressure of 15 mmHg. FINDINGS  Left Ventricle: Left ventricular ejection fraction, by estimation, is 60 to 65%. The left ventricle has normal function. The left ventricle has no regional wall motion abnormalities. The left ventricular internal cavity size was normal in size. There is  mild concentric left ventricular hypertrophy. Left ventricular diastolic parameters are consistent with Grade I diastolic dysfunction (impaired relaxation). Normal left ventricular filling pressure. Right Ventricle: The right ventricular size is normal. No increase in right ventricular wall thickness. Right ventricular systolic function is normal. There is moderately elevated pulmonary artery systolic pressure. The tricuspid regurgitant velocity is 3.15 m/s, and with an assumed right atrial pressure of 15 mmHg, the estimated right ventricular systolic pressure is 42.6 mmHg. Left Atrium: Left atrial size was normal in size. Right Atrium: Right atrial size was normal in size. Pericardium: There is no evidence of pericardial effusion. Mitral Valve: The mitral valve is  normal in structure. No evidence of mitral valve regurgitation. No evidence of mitral valve stenosis. Tricuspid Valve: The tricuspid valve is normal in structure. Tricuspid valve regurgitation is trivial. No evidence of tricuspid stenosis. Aortic Valve: The aortic valve is normal in structure. Aortic valve regurgitation is not visualized. No aortic stenosis is present.  Pulmonic Valve: The pulmonic valve was grossly normal. Pulmonic valve regurgitation is not visualized. No evidence of pulmonic stenosis. Aorta: The aortic root is normal in size and structure. Venous: The inferior vena cava is dilated in size with less than 50% respiratory variability, suggesting right atrial pressure of 15 mmHg. IAS/Shunts: No atrial level shunt detected by color flow Doppler.  LEFT VENTRICLE PLAX 2D LVIDd:         3.50 cm     Diastology LVIDs:         2.20 cm     LV e' medial:    10.30 cm/s LV PW:         1.35 cm     LV E/e' medial:  8.5 LV IVS:        1.43 cm     LV e' lateral:   10.10 cm/s LVOT diam:     1.90 cm     LV E/e' lateral: 8.6 LV SV:         75 LV SV Index:   40 LVOT Area:     2.84 cm  LV Volumes (MOD) LV vol d, MOD A2C: 68.4 ml LV vol d, MOD A4C: 67.0 ml LV vol s, MOD A2C: 17.3 ml LV vol s, MOD A4C: 11.0 ml LV SV MOD A2C:     51.1 ml LV SV MOD A4C:     67.0 ml LV SV MOD BP:      52.4 ml RIGHT VENTRICLE             IVC RV S prime:     15.10 cm/s  IVC diam: 2.50 cm TAPSE (M-mode): 2.5 cm LEFT ATRIUM           Index       RIGHT ATRIUM           Index LA diam:      2.50 cm 1.33 cm/m  RA Area:     17.40 cm LA Vol (A2C): 26.7 ml 14.17 ml/m RA Volume:   48.10 ml  25.53 ml/m LA Vol (A4C): 13.7 ml 7.27 ml/m  AORTIC VALVE LVOT Vmax:   136.00 cm/s LVOT Vmean:  98.800 cm/s LVOT VTI:    0.263 m  AORTA Ao Root diam: 3.70 cm Ao Asc diam:  3.40 cm MITRAL VALVE                TRICUSPID VALVE MV Area (PHT): 4.39 cm     TR Peak grad:   39.7 mmHg MV Decel Time: 173 msec     TR Vmax:        315.00 cm/s MV E velocity: 87.30 cm/s MV A velocity: 116.00 cm/s  SHUNTS MV E/A ratio:  0.75         Systemic VTI:  0.26 m                             Systemic Diam: 1.90 cm Dani Gobble Croitoru MD Electronically signed by Sanda Klein MD Signature Date/Time: 04/28/2020/12:40:37 PM    Final      Time coordinating discharge: Over 30 minutes    Dwyane Dee, MD  [facility] 04/30/2020, 11:53 AM

## 2020-04-30 NOTE — Plan of Care (Signed)
Plan of care reviewed and discussed with the patient. 

## 2020-05-12 ENCOUNTER — Other Ambulatory Visit: Payer: Self-pay | Admitting: Emergency Medicine

## 2020-05-12 DIAGNOSIS — J449 Chronic obstructive pulmonary disease, unspecified: Secondary | ICD-10-CM

## 2020-05-19 ENCOUNTER — Other Ambulatory Visit: Payer: Self-pay | Admitting: Emergency Medicine

## 2020-05-19 DIAGNOSIS — E119 Type 2 diabetes mellitus without complications: Secondary | ICD-10-CM

## 2020-05-27 ENCOUNTER — Ambulatory Visit: Payer: Self-pay | Admitting: Emergency Medicine

## 2020-06-09 ENCOUNTER — Ambulatory Visit: Payer: Self-pay | Admitting: Emergency Medicine

## 2020-06-22 ENCOUNTER — Other Ambulatory Visit: Payer: Self-pay

## 2020-06-22 ENCOUNTER — Encounter: Payer: Self-pay | Admitting: Emergency Medicine

## 2020-06-22 ENCOUNTER — Ambulatory Visit (INDEPENDENT_AMBULATORY_CARE_PROVIDER_SITE_OTHER): Payer: Self-pay | Admitting: Emergency Medicine

## 2020-06-22 VITALS — BP 126/78 | HR 102 | Temp 98.0°F | Ht 68.0 in | Wt 169.2 lb

## 2020-06-22 DIAGNOSIS — E1165 Type 2 diabetes mellitus with hyperglycemia: Secondary | ICD-10-CM

## 2020-06-22 DIAGNOSIS — J449 Chronic obstructive pulmonary disease, unspecified: Secondary | ICD-10-CM

## 2020-06-22 LAB — POCT GLYCOSYLATED HEMOGLOBIN (HGB A1C): Hemoglobin A1C: 11.6 % — AB (ref 4.0–5.6)

## 2020-06-22 LAB — GLUCOSE, POCT (MANUAL RESULT ENTRY): POC Glucose: 283 mg/dl — AB (ref 70–99)

## 2020-06-22 MED ORDER — BUDESONIDE-FORMOTEROL FUMARATE 160-4.5 MCG/ACT IN AERO: 2.0000 | INHALATION_SPRAY | Freq: Two times a day (BID) | RESPIRATORY_TRACT | 11 refills | Status: DC

## 2020-06-22 MED ORDER — ALBUTEROL SULFATE (2.5 MG/3ML) 0.083% IN NEBU: 2.5000 mg | INHALATION_SOLUTION | RESPIRATORY_TRACT | 3 refills | Status: DC | PRN

## 2020-06-22 MED ORDER — METFORMIN HCL 1000 MG PO TABS: 1000.0000 mg | ORAL_TABLET | Freq: Two times a day (BID) | ORAL | 3 refills | Status: DC

## 2020-06-22 MED ORDER — ATORVASTATIN CALCIUM 20 MG PO TABS: 20.0000 mg | ORAL_TABLET | Freq: Every day | ORAL | 3 refills | Status: DC

## 2020-06-22 MED ORDER — GLIPIZIDE 5 MG PO TABS: 5.0000 mg | ORAL_TABLET | Freq: Two times a day (BID) | ORAL | 3 refills | Status: DC

## 2020-06-22 NOTE — Progress Notes (Signed)
Duane Cuevas 64 y.o.   Chief Complaint  Patient presents with  . Medical Management of Chronic Issues    6 month f.u  Also was seen in the hospital a few months ago for chest pain     HISTORY OF PRESENT ILLNESS: This is a 64 y.o. male with history of COPD and diabetes here for follow-up. Ran out of Symbicort 2 months ago.  Has been using albuterol nebulizer almost daily. History of diabetes on Metformin and glipizide.  Most blood sugar readings at home high. No other complaints or medical concerns today.  HPI   Prior to Admission medications   Medication Sig Start Date End Date Taking? Authorizing Provider  acetaminophen (TYLENOL) 500 MG tablet Take 500 mg by mouth every 6 (six) hours as needed.   Yes [provider]  albuterol (PROVENTIL) (2.5 MG/3ML) 0.083% nebulizer solution TAKE 3 ML(2.5 MG TOTAL) BY NEBULIZATION EVERY 6(SIX) HOURS AS NEEDED 05/12/20  Yes Jordell Outten, Ines Bloomer, MD  aspirin EC 81 MG EC tablet Take 1 tablet (81 mg total) by mouth daily. 11/03/19  Yes Annita Brod, MD  budesonide-formoterol [facility]) 160-4.5 MCG/ACT inhaler Inhale 2 puffs into the lungs 2 (two) times daily. 08/26/19  Yes Ava Tangney, Ines Bloomer, MD  glipiZIDE (GLUCOTROL) 5 MG tablet TAKE ONE TABLET BY MOUTH ONE TIME DAILY WITH BREAKFAST 05/19/20  Yes [name], MD  metFORMIN (GLUCOPHAGE) 500 MG tablet Take 1 tablet (500 mg total) by mouth 2 (two) times daily with a meal. 08/26/19  Yes Archit Leger, Ines Bloomer, MD  atorvastatin (LIPITOR) 20 MG tablet Take 1 tablet (20 mg total) by mouth daily. Patient not taking: No sig reported 08/26/19   [name], MD    No Known Allergies  Patient Active Problem List   Diagnosis Date Noted  . Musculoskeletal chest pain 04/29/2020  . COPD exacerbation ([place]) 04/28/2020  . Acute exacerbation of chronic obstructive pulmonary disease (COPD) ([place]) 04/27/2020  . CAD (coronary artery disease) 01/23/2020  . Hyperlipidemia  11/02/2019  . History of MI (myocardial infarction) 11/02/2019  . Chest pain 11/01/2019  . Cigarette smoker 08/15/2018  . COPD ? GOLD III/ active smoker 08/14/2018  . History of diet-controlled diabetes 06/30/2018  . COPD (chronic obstructive pulmonary disease) ([place]) 10/14/2014  . Type 2 diabetes mellitus with hyperglycemia, without long-term current use of insulin ([place]) 10/14/2014    Past Medical History:  Diagnosis Date  . Diabetes mellitus ([place]) 10/2014   pt denies being diabetic.   . Emphysema of lung ([place])   . Emphysema/COPD ([place]) 10/2014  . Hepatic steatosis   . Thrombocytopenia ([place]) 09/2015   platelets in 120s.     Past Surgical History:  Procedure Laterality Date  . ESOPHAGOGASTRODUODENOSCOPY N/A 09/23/2015   Procedure: ESOPHAGOGASTRODUODENOSCOPY (EGD);  Surgeon: Ladene Artist, MD;  Location: [facility];  Service: Endoscopy;  Laterality: N/A;  . FOOT SURGERY    . INGUINAL HERNIA REPAIR Left 05/15/2018   Procedure: LEFT INGUINAL HERNIA REPAIR WITH MESH;  Surgeon: Erroll Luna, MD;  Location: [facility];  Service: General;  Laterality: Left;  . INSERTION OF MESH Left 05/15/2018   Procedure: INSERTION OF MESH;  Surgeon: Erroll Luna, MD;  Location: [facility];  Service: General;  Laterality: Left;  . UPPER GASTROINTESTINAL ENDOSCOPY      Social History   Socioeconomic History  . Marital status: Married    Spouse name: Ramonita Lab  . Number of children: 6  . Years of education: Not on file  . Highest education  level: Not on file  Occupational History  . Occupation: Employed    Employer: [employer]  Tobacco Use  . Smoking status: Current Every Day Smoker    Packs/day: 0.15    Years: 47.00    Pack years: 7.05    Types: Cigarettes  . Smokeless tobacco: Never Used  Vaping Use  . Vaping Use: Former  Substance and Sexual Activity  . Alcohol use: Yes    Comment: occ  . Drug use: No  . Sexual activity: Never  Other Topics Concern  . Not on file  Social History  Narrative   Lives with wife.  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   Social Determinants of Health   Financial Resource Strain: Not on file  Food Insecurity: Not on file  Transportation Needs: Not on file  Physical Activity: Not on file  Stress: Not on file  Social Connections: Not on file  Intimate Partner Violence: Not on file    Family History  Problem Relation Age of Onset  . Emphysema Mother   . Diabetes Mother   . Emphysema Father   . Cancer Sister        Stomach cancer  . Diabetes Sister   . Stomach cancer Sister   . Diabetes Brother   . Colon cancer Neg Hx   . Colon polyps Neg Hx   . Esophageal cancer Neg Hx   . Rectal cancer Neg Hx      Review of Systems  Constitutional: Negative.  Negative for chills and fever.  HENT: Negative.  Negative for congestion and sore throat.   Respiratory: Negative.  Negative for cough and shortness of breath.   Cardiovascular: Negative.  Negative for chest pain and palpitations.  Gastrointestinal: Negative.  Negative for abdominal pain, diarrhea, nausea and vomiting.  Genitourinary: Negative.  Negative for dysuria and hematuria.  Musculoskeletal: Negative.  Negative for back pain, myalgias and neck pain.  Skin: Negative.  Negative for rash.  Neurological: Negative.  Negative for dizziness and headaches.  All other systems reviewed and are negative.  Today's Vitals   06/22/20 0959  BP: 126/78  Pulse: (!) 102  Temp: 98 F (36.7 C)  TempSrc: Temporal  SpO2: 98%  Weight: 169 lb 3.2 oz (76.7 kg)  Height: 5\' 8"  (1.727 m)   Body mass index is 25.73 kg/m. Wt Readings from Last 3 Encounters:  06/22/20 169 lb 3.2 oz (76.7 kg)  04/28/20 165 lb 3.2 oz (74.9 kg)  01/23/20 162 lb 11.2 oz (73.8 kg)     Physical Exam Vitals reviewed.  Constitutional:      Appearance: Normal appearance.  HENT:     Head: Normocephalic.  Eyes:     Extraocular Movements: Extraocular movements intact.     Conjunctiva/sclera: Conjunctivae  normal.     Pupils: Pupils are equal, round, and reactive to light.  Cardiovascular:     Rate and Rhythm: Normal rate and regular rhythm.     Pulses: Normal pulses.     Heart sounds: Normal heart sounds.  Pulmonary:     Effort: Pulmonary effort is normal.     Breath sounds: Normal breath sounds.  Abdominal:     Palpations: Abdomen is soft.     Tenderness: There is no abdominal tenderness.  Musculoskeletal:        General: Normal range of motion.     Cervical back: Normal range of motion and neck supple.  Skin:    General: Skin is warm and dry.  Capillary Refill: Capillary refill takes less than 2 seconds.  Neurological:     General: No focal deficit present.     Mental Status: He is alert and oriented to person, place, and time.  Psychiatric:        Mood and Affect: Mood normal.        Behavior: Behavior normal.      Results for orders placed or performed in visit on 06/22/20 (from the past 24 hour(s))  POCT glucose (manual entry)     Status: Abnormal   Collection Time: 06/22/20 11:05 AM  Result Value Ref Range   POC Glucose 283 (A) 70 - 99 mg/dl  POCT glycosylated hemoglobin (Hb A1C)     Status: Abnormal   Collection Time: 06/22/20 11:13 AM  Result Value Ref Range   Hemoglobin A1C 11.6 (A) 4.0 - 5.6 %   HbA1c POC (<> result, manual entry)     HbA1c, POC (prediabetic range)     HbA1c, POC (controlled diabetic range)       ASSESSMENT & PLAN: Type 2 diabetes mellitus with hyperglycemia, without long-term current use of insulin (HCC) Uncontrolled diabetes with hemoglobin A1c at 11.6. We will increase Metformin to 1000 mg twice a day and glipizide 5 mg twice a day. Diet and nutrition discussed. Follow-up in 3 months.  COPD (chronic obstructive pulmonary disease) ([place]) COPD not well controlled.  Advised to restart Symbicort 2 puffs twice a day. Only use albuterol as a rescue inhaler as needed.  Kincaid was seen today for medical management of chronic  issues.  Diagnoses and all orders for this visit:  Type 2 diabetes mellitus with hyperglycemia, without long-term current use of insulin (HCC) -     POCT glucose (manual entry) -     POCT glycosylated hemoglobin (Hb A1C) -     Comprehensive metabolic panel -     Lipid panel -     Discontinue: atorvastatin (LIPITOR) 20 MG tablet; Take 1 tablet (20 mg total) by mouth daily. -     glipiZIDE (GLUCOTROL) 5 MG tablet; Take 1 tablet (5 mg total) by mouth 2 (two) times daily before a meal. -     metFORMIN (GLUCOPHAGE) 1000 MG tablet; Take 1 tablet (1,000 mg total) by mouth 2 (two) times daily with a meal. -     atorvastatin (LIPITOR) 20 MG tablet; Take 1 tablet (20 mg total) by mouth daily.  Chronic obstructive pulmonary disease, unspecified COPD type ([place]) -     Discontinue: albuterol (PROVENTIL) (2.5 MG/3ML) 0.083% nebulizer solution; Take 3 mLs (2.5 mg total) by nebulization every 4 (four) hours as needed for wheezing or shortness of breath. -     albuterol (PROVENTIL) (2.5 MG/3ML) 0.083% nebulizer solution; Take 3 mLs (2.5 mg total) by nebulization every 4 (four) hours as needed for wheezing or shortness of breath. -     Discontinue: budesonide-formoterol (SYMBICORT) 160-4.5 MCG/ACT inhaler; Inhale 2 puffs into the lungs 2 (two) times daily. -     budesonide-formoterol (SYMBICORT) 160-4.5 MCG/ACT inhaler; Inhale 2 puffs into the lungs 2 (two) times daily.  Chronic obstructive pulmonary disease, unspecified COPD type ([place]) -     Discontinue: albuterol (PROVENTIL) (2.5 MG/3ML) 0.083% nebulizer solution; Take 3 mLs (2.5 mg total) by nebulization every 4 (four) hours as needed for wheezing or shortness of breath. -     albuterol (PROVENTIL) (2.5 MG/3ML) 0.083% nebulizer solution; Take 3 mLs (2.5 mg total) by nebulization every 4 (four) hours as needed for wheezing or shortness  of breath. -     Discontinue: budesonide-formoterol (SYMBICORT) 160-4.5 MCG/ACT inhaler; Inhale 2 puffs into the lungs 2 (two)  times daily. -     budesonide-formoterol (SYMBICORT) 160-4.5 MCG/ACT inhaler; Inhale 2 puffs into the lungs 2 (two) times daily.    Patient Instructions       If you have lab work done today you will be contacted with your lab results within the next 2 weeks.  If you have not heard from [place] then please contact us. The fastest way to get your results is to register for My Chart.   IF you received an x-ray today, you will receive an invoice from [facility]. Please contact [facility] at [phone] with questions or concerns regarding your invoice.   IF you received labwork today, you will receive an invoice from [facility]. Please contact [facility] at [phone] with questions or concerns regarding your invoice.   Our billing staff will not be able to assist you with questions regarding bills from these companies.  You will be contacted with the lab results as soon as they are available. The fastest way to get your results is to activate your My Chart account. Instructions are located on the last page of this paperwork. If you have not heard from [place] regarding the results in 2 weeks, please contact this office.     Diabetes mellitus y nutricin, en adultos Diabetes Mellitus and Nutrition, Adult Si sufre de diabetes (diabetes mellitus), es muy importante tener hbitos alimenticios saludables debido a que sus niveles de Designer, television/film set sangre (glucosa) se ven afectados en gran medida por lo que come y bebe. [place] alimentos saludables en las cantidades [place], aproximadamente a la [employer], [place] ayudar a:  Aeronautical engineer glucemia.  Disminuir el riesgo de sufrir una enfermedad cardaca.  Mejorar la presin arterial.  Science writer o mantener un peso saludable. Todas las personas que sufren de diabetes son diferentes y cada una tiene necesidades diferentes en cuanto a un plan de alimentacin. El mdico puede recomendarle que trabaje con un especialista en dietas  y nutricin (nutricionista) para Financial trader plan para usted. Su plan de alimentacin puede variar segn factores como:  Las caloras que necesita.  Los medicamentos que toma.  Su peso.  Sus niveles de glucemia, presin arterial y colesterol.  Su nivel de [place].  Otras afecciones que tenga, como enfermedades cardacas o renales. Cmo me afectan los carbohidratos? Los carbohidratos, o hidratos de carbono, afectan su nivel de glucemia ms que cualquier otro tipo de alimento. La ingesta de carbohidratos naturalmente aumenta la cantidad de [employer]. El recuento de carbohidratos es un mtodo destinado a Catering manager un registro de la cantidad de carbohidratos que se consumen. El recuento de carbohidratos es importante para Theatre manager la glucemia a un nivel saludable, especialmente si utiliza insulina o toma determinados medicamentos por va oral para la diabetes. Es importante conocer la cantidad de carbohidratos que se pueden ingerir en cada comida sin correr Engineer, manufacturing. Esto es Psychologist, forensic. Su nutricionista puede ayudarlo a calcular la cantidad de carbohidratos que debe ingerir en cada comida y en cada refrigerio. Entre los alimentos que contienen carbohidratos, se incluyen:  Pan, cereal, arroz, pastas y galletas.  Papas y maz.  Guisantes, frijoles y lentejas.  Leche y Estate agent.  Lambert Mody y [place].  Postres, como pasteles, galletas, helado y caramelos. Cmo me afecta el alcohol? El alcohol puede provocar disminuciones sbitas de la glucemia (hipoglucemia), especialmente si Margretta Sidle  insulina o toma determinados medicamentos por va oral para la diabetes. La hipoglucemia es una afeccin potencialmente mortal. Los sntomas de la hipoglucemia (somnolencia, mareos y confusin) son similares a los sntomas de haber consumido demasiado alcohol. Si el mdico afirma que el alcohol es seguro para usted, [place] estas pautas:  Limite el consumo de alcohol a no ms de  33medida por da si es mujer y no est [place], y a 7medidas si es hombre. Una medida equivale a 12oz (381ml) de cerveza, 5oz (151ml) de vino o 1oz (35ml) de bebidas alcohlicas de alta graduacin.  No beba con el estmago vaco.  Mantngase hidratado bebiendo agua, refrescos dietticos o t helado sin azcar.  Tenga en cuenta que los refrescos comunes, los jugos y otras bebida para Optician, dispensing pueden contener mucha azcar y se deben contar como carbohidratos. Cules son algunos consejos para seguir este plan?  Leer las etiquetas de los alimentos  Comience por leer el tamao de la porcin en la "Informacin nutricional" en las etiquetas de los alimentos envasados y las bebidas. La cantidad de caloras, carbohidratos, grasas y otros nutrientes mencionados en la etiqueta se basan en una porcin del alimento. Muchos alimentos contienen ms de una porcin por envase.  Verifique la cantidad total de gramos (g) de carbohidratos totales en una porcin. Puede calcular la cantidad de porciones de carbohidratos al dividir el total de carbohidratos por 15. Por ejemplo, si un alimento tiene un total de 30g de carbohidratos, equivale a 2 porciones de carbohidratos.  Verifique la cantidad de gramos (g) de grasas saturadas y grasas trans en una porcin. Escoja alimentos que no contengan grasa o que tengan un bajo contenido.  Verifique la cantidad de miligramos (mg) de sal (sodio) en una porcin. La mayora de las personas deben limitar la ingesta de sodio total a menos de 2300mg  por Training and development officer.  Siempre consulte la informacin nutricional de los alimentos etiquetados como "con bajo contenido de grasa" o "sin grasa". Estos alimentos pueden tener un mayor contenido de Location manager agregada o carbohidratos refinados, y deben evitarse.  Hable con su nutricionista para identificar sus objetivos diarios en cuanto a los nutrientes mencionados en la etiqueta. Al ir de compras  Evite comprar alimentos procesados,  enlatados o precocinados. Estos alimentos tienden a Special educational needs teacher mayor cantidad de [place], sodio y azcar agregada.  Compre en la zona exterior de la tienda de comestibles. Esta zona incluye frutas y verduras frescas, granos a granel, carnes frescas y productos lcteos frescos. Al cocinar  Utilice mtodos de coccin a baja temperatura, como hornear, en lugar de mtodos de coccin a alta temperatura, como frer en abundante aceite.  Cocine con aceites saludables, como el aceite de [place], canola o [place].  Evite cocinar con manteca, crema o carnes con alto contenido de grasa. Planificacin de las comidas  Coma las comidas y los refrigerios regularmente, preferentemente a la misma hora todos [place]. Evite pasar largos perodos de tiempo sin comer.  Consuma alimentos ricos en fibra, como frutas frescas, verduras, frijoles y cereales integrales. Consulte a su nutricionista sobre cuntas porciones de carbohidratos puede consumir en cada comida.  Consuma entre 4 y 6 onzas (oz) de protenas magras por da, como carnes [place], pollo, pescado, huevos o tofu. Una onza de protena magra equivale a: ? 1 onza de carne, pollo o pescado. ? 1huevo. ?  taza de tofu.  Coma algunos alimentos por da que contengan grasas saludables, como aguacates, frutos secos, semillas y pescado. Estilo de vida  Controle su nivel  de glucemia con regularidad.  Haga actividad fsica habitualmente como se lo haya indicado el mdico. Esto puede incluir lo siguiente: ? 13minutos semanales de ejercicio de intensidad moderada o alta. Esto podra incluir caminatas dinmicas, ciclismo o gimnasia acutica. ? Realizar ejercicios de elongacin y de fortalecimiento, como yoga o levantamiento de pesas, por lo menos 2veces por semana.  Tome los [employer] se lo haya indicado el mdico.  No consuma ningn producto que contenga nicotina o tabaco, como cigarrillos y Psychologist, sport and exercise. Si necesita ayuda para dejar de  fumar, consulte al [employer] con un asesor o instructor en diabetes para identificar estrategias para controlar el estrs y cualquier desafo emocional y social. Preguntas para hacerle al mdico  Es necesario que consulte a Radio broadcast assistant en el cuidado de la diabetes?  Es necesario que me rena con un nutricionista?  A qu nmero puedo llamar si tengo preguntas?  Cules son los mejores momentos para controlar la glucemia? Dnde encontrar ms informacin:  Asociacin Estadounidense de la Diabetes (American Diabetes Association): diabetes.org  Academia de Nutricin y Information systems manager (Academy of Nutrition and Dietetics): www.eatright.[place] Diabetes y las Enfermedades Digestivas y [facility] of Diabetes and Digestive and Kidney Diseases, NIH): [employer]inesFuneral.dk Resumen  Un plan de alimentacin saludable lo ayudar a Aeronautical engineer glucemia y Theatre manager un estilo de vida saludable.  Trabajar con un especialista en dietas y nutricin (nutricionista) puede ayudarlo a Insurance claims handler de alimentacin para usted.  Tenga en cuenta que los carbohidratos (hidratos de carbono) y el alcohol tienen efectos inmediatos en sus niveles de glucemia. Es importante contar los carbohidratos que ingiere y consumir alcohol con prudencia. Esta informacin no tiene Marine scientist el consejo del mdico. Asegrese de hacerle al mdico cualquier pregunta que tenga. Document Revised: 02/28/2017 Document Reviewed: 10/10/2016 Elsevier Patient Education  2020 Elsevier Inc.      Agustina Caroli, MD [facility]

## 2020-06-22 NOTE — Patient Instructions (Addendum)
If you have lab work done today you will be contacted with your lab results within the next 2 weeks.  If you have not heard from [place] then please contact us. The fastest way to get your results is to register for My Chart.   IF you received an x-ray today, you will receive an invoice from [facility]. Please contact [facility] at [phone] with questions or concerns regarding your invoice.   IF you received labwork today, you will receive an invoice from [facility]. Please contact [facility] at [phone] with questions or concerns regarding your invoice.   Our billing staff will not be able to assist you with questions regarding bills from these companies.  You will be contacted with the lab results as soon as they are available. The fastest way to get your results is to activate your My Chart account. Instructions are located on the last page of this paperwork. If you have not heard from [place] regarding the results in 2 weeks, please contact this office.     Diabetes mellitus y nutricin, en adultos Diabetes Mellitus and Nutrition, Adult Si sufre de diabetes (diabetes mellitus), es muy importante tener hbitos alimenticios saludables debido a que sus niveles de Designer, television/film set sangre (glucosa) se ven afectados en gran medida por lo que come y bebe. [place] alimentos saludables en las cantidades [place], aproximadamente a la [employer], [place] ayudar a:  Aeronautical engineer glucemia.  Disminuir el riesgo de sufrir una enfermedad cardaca.  Mejorar la presin arterial.  Science writer o mantener un peso saludable. Todas las personas que sufren de diabetes son diferentes y cada una tiene necesidades diferentes en cuanto a un plan de alimentacin. El mdico puede recomendarle que trabaje con un especialista en dietas y nutricin (nutricionista) para Financial trader plan para usted. Su plan de alimentacin puede variar segn factores como:  Las caloras que  necesita.  Los medicamentos que toma.  Su peso.  Sus niveles de glucemia, presin arterial y colesterol.  Su nivel de [place].  Otras afecciones que tenga, como enfermedades cardacas o renales. Cmo me afectan los carbohidratos? Los carbohidratos, o hidratos de carbono, afectan su nivel de glucemia ms que cualquier otro tipo de alimento. La ingesta de carbohidratos naturalmente aumenta la cantidad de [employer]. El recuento de carbohidratos es un mtodo destinado a Catering manager un registro de la cantidad de carbohidratos que se consumen. El recuento de carbohidratos es importante para Theatre manager la glucemia a un nivel saludable, especialmente si utiliza insulina o toma determinados medicamentos por va oral para la diabetes. Es importante conocer la cantidad de carbohidratos que se pueden ingerir en cada comida sin correr Engineer, manufacturing. Esto es Psychologist, forensic. Su nutricionista puede ayudarlo a calcular la cantidad de carbohidratos que debe ingerir en cada comida y en cada refrigerio. Entre los alimentos que contienen carbohidratos, se incluyen:  Pan, cereal, arroz, pastas y galletas.  Papas y maz.  Guisantes, frijoles y lentejas.  Leche y Estate agent.  Lambert Mody y [place].  Postres, como pasteles, galletas, helado y caramelos. Cmo me afecta el alcohol? El alcohol puede provocar disminuciones sbitas de la glucemia (hipoglucemia), especialmente si utiliza insulina o toma determinados medicamentos por va oral para la diabetes. La hipoglucemia es una afeccin potencialmente mortal. Los sntomas de la hipoglucemia (somnolencia, mareos y confusin) son similares a los sntomas de haber consumido demasiado alcohol. Si el mdico afirma que el alcohol es seguro para usted, [place] estas pautas:  Limite el consumo de alcohol a no ms de 52medida por da si es mujer y no est [place], y a 34medidas si es hombre. Una medida equivale a 12oz (336ml) de cerveza, 5oz (152ml) de vino o  1oz (7ml) de bebidas alcohlicas de alta graduacin.  No beba con el estmago vaco.  Mantngase hidratado bebiendo agua, refrescos dietticos o t helado sin azcar.  Tenga en cuenta que los refrescos comunes, los jugos y otras bebida para Optician, dispensing pueden contener mucha azcar y se deben contar como carbohidratos. Cules son algunos consejos para seguir este plan?  Leer las etiquetas de los alimentos  Comience por leer el tamao de la porcin en la "Informacin nutricional" en las etiquetas de los alimentos envasados y las bebidas. La cantidad de caloras, carbohidratos, grasas y otros nutrientes mencionados en la etiqueta se basan en una porcin del alimento. Muchos alimentos contienen ms de una porcin por envase.  Verifique la cantidad total de gramos (g) de carbohidratos totales en una porcin. Puede calcular la cantidad de porciones de carbohidratos al dividir el total de carbohidratos por 15. Por ejemplo, si un alimento tiene un total de 30g de carbohidratos, equivale a 2 porciones de carbohidratos.  Verifique la cantidad de gramos (g) de grasas saturadas y grasas trans en una porcin. Escoja alimentos que no contengan grasa o que tengan un bajo contenido.  Verifique la cantidad de miligramos (mg) de sal (sodio) en una porcin. [place] [employer] de las personas deben limitar la ingesta de sodio total a menos de 2300mg  por Training and development officer.  Siempre consulte la informacin nutricional de los alimentos etiquetados como "con bajo contenido de grasa" o "sin grasa". Estos alimentos pueden tener un mayor contenido de Location manager agregada o carbohidratos refinados, y deben evitarse.  Hable con su nutricionista para identificar sus objetivos diarios en cuanto a los nutrientes mencionados en la etiqueta. Al ir de compras  Evite comprar alimentos procesados, enlatados o precocinados. Estos alimentos tienden a Special educational needs teacher mayor cantidad de [place], sodio y azcar agregada.  Compre en la zona exterior de la tienda de  comestibles. Esta zona incluye frutas y verduras frescas, granos a granel, carnes frescas y productos lcteos frescos. Al cocinar  Utilice mtodos de coccin a baja temperatura, como hornear, en lugar de mtodos de coccin a alta temperatura, como frer en abundante aceite.  Cocine con aceites saludables, como el aceite de [place], canola o [place].  Evite cocinar con manteca, crema o carnes con alto contenido de grasa. Planificacin de las comidas  Coma las comidas y los refrigerios regularmente, preferentemente a la misma hora todos [place]. Evite pasar largos perodos de tiempo sin comer.  Consuma alimentos ricos en fibra, como frutas frescas, verduras, frijoles y cereales integrales. Consulte a su nutricionista sobre cuntas porciones de carbohidratos puede consumir en cada comida.  Consuma entre 4 y 6 onzas (oz) de protenas magras por da, como carnes [place], pollo, pescado, huevos o tofu. Una onza de protena magra equivale a: ? 1 onza de carne, pollo o pescado. ? 1huevo. ?  taza de tofu.  Coma algunos alimentos por da que contengan grasas saludables, como aguacates, frutos secos, semillas y pescado. Estilo de vida  Controle su nivel de glucemia con regularidad.  Haga actividad fsica habitualmente como se lo haya indicado el mdico. Esto puede incluir lo siguiente: ? 16minutos semanales de ejercicio de intensidad moderada o alta. Esto podra incluir caminatas dinmicas, ciclismo o gimnasia acutica. ? Realizar ejercicios de elongacin y de fortalecimiento, como yoga o levantamiento  de pesas, por lo menos 2veces por semana.  Tome los [employer] se lo haya indicado el mdico.  No consuma ningn producto que contenga nicotina o tabaco, como cigarrillos y Psychologist, sport and exercise. Si necesita ayuda para dejar de fumar, consulte al [employer] con un asesor o instructor en diabetes para identificar estrategias para controlar el estrs y cualquier desafo emocional  y social. Preguntas para hacerle al mdico  Es necesario que consulte a Radio broadcast assistant en el cuidado de la diabetes?  Es necesario que me rena con un nutricionista?  A qu nmero puedo llamar si tengo preguntas?  Cules son los mejores momentos para controlar la glucemia? Dnde encontrar ms informacin:  Asociacin Estadounidense de la Diabetes (American Diabetes Association): diabetes.org  Academia de Nutricin y Information systems manager (Academy of Nutrition and Dietetics): www.eatright.[place] Diabetes y las Enfermedades Digestivas y [facility] of Diabetes and Digestive and Kidney Diseases, NIH): [employer].dk Resumen  Un plan de alimentacin saludable lo ayudar a Aeronautical engineer glucemia y Theatre manager un estilo de vida saludable.  Trabajar con un especialista en dietas y nutricin (nutricionista) puede ayudarlo a Insurance claims handler de alimentacin para usted.  Tenga en cuenta que los carbohidratos (hidratos de carbono) y el alcohol tienen efectos inmediatos en sus niveles de glucemia. Es importante contar los carbohidratos que ingiere y consumir alcohol con prudencia. Esta informacin no tiene Marine scientist el consejo del mdico. Asegrese de hacerle al mdico cualquier pregunta que tenga. Document Revised: 02/28/2017 Document Reviewed: 10/10/2016 Elsevier Patient Education  [place].

## 2020-06-22 NOTE — Assessment & Plan Note (Signed)
COPD not well controlled.  Advised to restart Symbicort 2 puffs twice a day. Only use albuterol as a rescue inhaler as needed.

## 2020-06-22 NOTE — Assessment & Plan Note (Signed)
Uncontrolled diabetes with hemoglobin A1c at 11.6. We will increase Metformin to 1000 mg twice a day and glipizide 5 mg twice a day. Diet and nutrition discussed. Follow-up in 3 months.

## 2020-06-23 LAB — COMPREHENSIVE METABOLIC PANEL
ALT: 22 IU/L (ref 0–44)
AST: 18 IU/L (ref 0–40)
Albumin/Globulin Ratio: 1.9 (ref 1.2–2.2)
Albumin: 4.5 g/dL (ref 3.8–4.8)
Alkaline Phosphatase: 97 IU/L (ref 44–121)
BUN/Creatinine Ratio: 13 (ref 10–24)
BUN: 12 mg/dL (ref 8–27)
Bilirubin Total: 0.9 mg/dL (ref 0.0–1.2)
CO2: 24 mmol/L (ref 20–29)
Calcium: 9.6 mg/dL (ref 8.6–10.2)
Chloride: 99 mmol/L (ref 96–106)
Creatinine, Ser: 0.92 mg/dL (ref 0.76–1.27)
GFR calc Af Amer: 101 mL/min/{1.73_m2} (ref >59)
GFR calc non Af Amer: 88 mL/min/{1.73_m2} (ref >59)
Globulin, Total: 2.4 g/dL (ref 1.5–4.5)
Glucose: 271 mg/dL — ABNORMAL HIGH (ref 65–99)
Potassium: 4.6 mmol/L (ref 3.5–5.2)
Sodium: 138 mmol/L (ref 134–144)
Total Protein: 6.9 g/dL (ref 6.0–8.5)

## 2020-06-23 LAB — LIPID PANEL
Chol/HDL Ratio: 3.6 ratio (ref 0.0–5.0)
Cholesterol, Total: 172 mg/dL (ref 100–199)
HDL: 48 mg/dL (ref >39)
LDL Chol Calc (NIH): 102 mg/dL — ABNORMAL HIGH (ref 0–99)
Triglycerides: 126 mg/dL (ref 0–149)
VLDL Cholesterol Cal: 22 mg/dL (ref 5–40)

## 2020-06-25 ENCOUNTER — Other Ambulatory Visit: Payer: Self-pay

## 2020-06-25 ENCOUNTER — Emergency Department (HOSPITAL_COMMUNITY): Payer: Self-pay

## 2020-06-25 ENCOUNTER — Encounter (HOSPITAL_COMMUNITY): Payer: Self-pay | Admitting: *Deleted

## 2020-06-25 ENCOUNTER — Emergency Department (HOSPITAL_COMMUNITY)
Admission: EM | Admit: 2020-06-25 | Discharge: 2020-06-26 | Disposition: A | Discharge status: Home / Self Care | Payer: Self-pay | Attending: Emergency Medicine | Admitting: Emergency Medicine

## 2020-06-25 DIAGNOSIS — H5711 Ocular pain, right eye: Secondary | ICD-10-CM | POA: Insufficient documentation

## 2020-06-25 DIAGNOSIS — Z5321 Procedure and treatment not carried out due to patient leaving prior to being seen by health care provider: Secondary | ICD-10-CM | POA: Insufficient documentation

## 2020-06-25 DIAGNOSIS — H5712 Ocular pain, left eye: Secondary | ICD-10-CM | POA: Insufficient documentation

## 2020-06-25 DIAGNOSIS — R059 Cough, unspecified: Secondary | ICD-10-CM | POA: Insufficient documentation

## 2020-06-25 DIAGNOSIS — R079 Chest pain, unspecified: Secondary | ICD-10-CM | POA: Insufficient documentation

## 2020-06-25 DIAGNOSIS — R519 Headache, unspecified: Secondary | ICD-10-CM | POA: Insufficient documentation

## 2020-06-25 DIAGNOSIS — R42 Dizziness and giddiness: Secondary | ICD-10-CM | POA: Insufficient documentation

## 2020-06-25 LAB — BASIC METABOLIC PANEL
Anion gap: 10 (ref 5–15)
BUN: 12 mg/dL (ref 8–23)
CO2: 23 mmol/L (ref 22–32)
Calcium: 9.2 mg/dL (ref 8.9–10.3)
Chloride: 102 mmol/L (ref 98–111)
Creatinine, Ser: 0.82 mg/dL (ref 0.61–1.24)
GFR, Estimated: >60 mL/min (ref >60)
Glucose, Bld: 263 mg/dL — ABNORMAL HIGH (ref 70–99)
Potassium: 4 mmol/L (ref 3.5–5.1)
Sodium: 135 mmol/L (ref 135–145)

## 2020-06-25 LAB — TROPONIN I (HIGH SENSITIVITY): Troponin I (High Sensitivity): 4 ng/L (ref <18)

## 2020-06-25 LAB — CBC
HCT: 44.8 % (ref 39.0–52.0)
Hemoglobin: 15.7 g/dL (ref 13.0–17.0)
MCH: 32.1 pg (ref 26.0–34.0)
MCHC: 35 g/dL (ref 30.0–36.0)
MCV: 91.6 fL (ref 80.0–100.0)
Platelets: 244 10*3/uL (ref 150–400)
RBC: 4.89 MIL/uL (ref 4.22–5.81)
RDW: 12.1 % (ref 11.5–15.5)
WBC: 7.1 10*3/uL (ref 4.0–10.5)
nRBC: 0 % (ref 0.0–0.2)

## 2020-06-25 NOTE — ED Triage Notes (Addendum)
Pt complains of dizziness since yesterday, he feels like the room is spinning. He also complains of headache/pain behind both eyes. He also reports chest since this morning, worse with cough.

## 2020-06-26 ENCOUNTER — Emergency Department (HOSPITAL_COMMUNITY): Payer: Self-pay

## 2020-06-26 ENCOUNTER — Emergency Department (HOSPITAL_COMMUNITY)
Admission: EM | Admit: 2020-06-26 | Discharge: 2020-06-26 | Disposition: A | Discharge status: Home / Self Care | Payer: Self-pay | Attending: Emergency Medicine | Admitting: Emergency Medicine

## 2020-06-26 ENCOUNTER — Other Ambulatory Visit: Payer: Self-pay

## 2020-06-26 ENCOUNTER — Encounter (HOSPITAL_COMMUNITY): Payer: Self-pay

## 2020-06-26 DIAGNOSIS — R200 Anesthesia of skin: Secondary | ICD-10-CM | POA: Insufficient documentation

## 2020-06-26 DIAGNOSIS — Z7982 Long term (current) use of aspirin: Secondary | ICD-10-CM | POA: Insufficient documentation

## 2020-06-26 DIAGNOSIS — J441 Chronic obstructive pulmonary disease with (acute) exacerbation: Secondary | ICD-10-CM | POA: Insufficient documentation

## 2020-06-26 DIAGNOSIS — R059 Cough, unspecified: Secondary | ICD-10-CM

## 2020-06-26 DIAGNOSIS — H53129 Transient visual loss, unspecified eye: Secondary | ICD-10-CM | POA: Insufficient documentation

## 2020-06-26 DIAGNOSIS — R0789 Other chest pain: Secondary | ICD-10-CM

## 2020-06-26 DIAGNOSIS — R072 Precordial pain: Secondary | ICD-10-CM | POA: Insufficient documentation

## 2020-06-26 DIAGNOSIS — E1165 Type 2 diabetes mellitus with hyperglycemia: Secondary | ICD-10-CM | POA: Insufficient documentation

## 2020-06-26 DIAGNOSIS — E860 Dehydration: Secondary | ICD-10-CM

## 2020-06-26 DIAGNOSIS — H539 Unspecified visual disturbance: Secondary | ICD-10-CM

## 2020-06-26 DIAGNOSIS — R5383 Other fatigue: Secondary | ICD-10-CM | POA: Insufficient documentation

## 2020-06-26 DIAGNOSIS — Z20822 Contact with and (suspected) exposure to covid-19: Secondary | ICD-10-CM | POA: Insufficient documentation

## 2020-06-26 DIAGNOSIS — I2510 Atherosclerotic heart disease of native coronary artery without angina pectoris: Secondary | ICD-10-CM | POA: Insufficient documentation

## 2020-06-26 DIAGNOSIS — R110 Nausea: Secondary | ICD-10-CM | POA: Insufficient documentation

## 2020-06-26 DIAGNOSIS — R42 Dizziness and giddiness: Secondary | ICD-10-CM

## 2020-06-26 DIAGNOSIS — Z7984 Long term (current) use of oral hypoglycemic drugs: Secondary | ICD-10-CM | POA: Insufficient documentation

## 2020-06-26 DIAGNOSIS — F17210 Nicotine dependence, cigarettes, uncomplicated: Secondary | ICD-10-CM | POA: Insufficient documentation

## 2020-06-26 DIAGNOSIS — R519 Headache, unspecified: Secondary | ICD-10-CM | POA: Insufficient documentation

## 2020-06-26 LAB — HEPATIC FUNCTION PANEL
ALT: 24 U/L (ref 0–44)
AST: 21 U/L (ref 15–41)
Albumin: 3.9 g/dL (ref 3.5–5.0)
Alkaline Phosphatase: 61 U/L (ref 38–126)
Bilirubin, Direct: 0.1 mg/dL (ref 0.0–0.2)
Indirect Bilirubin: 0.9 mg/dL (ref 0.3–0.9)
Total Bilirubin: 1 mg/dL (ref 0.3–1.2)
Total Protein: 6.6 g/dL (ref 6.5–8.1)

## 2020-06-26 LAB — D-DIMER, QUANTITATIVE: D-Dimer, Quant: 0.32 ug/mL-FEU (ref 0.00–0.50)

## 2020-06-26 LAB — RESP PANEL BY RT-PCR (FLU A&B, COVID) ARPGX2
Influenza A by PCR: NEGATIVE
Influenza B by PCR: NEGATIVE
SARS Coronavirus 2 by RT PCR: NEGATIVE

## 2020-06-26 LAB — CBC
HCT: 44.5 % (ref 39.0–52.0)
Hemoglobin: 15.2 g/dL (ref 13.0–17.0)
MCH: 31.6 pg (ref 26.0–34.0)
MCHC: 34.2 g/dL (ref 30.0–36.0)
MCV: 92.5 fL (ref 80.0–100.0)
Platelets: 237 10*3/uL (ref 150–400)
RBC: 4.81 MIL/uL (ref 4.22–5.81)
RDW: 12 % (ref 11.5–15.5)
WBC: 7.6 10*3/uL (ref 4.0–10.5)
nRBC: 0 % (ref 0.0–0.2)

## 2020-06-26 LAB — BASIC METABOLIC PANEL
Anion gap: 11 (ref 5–15)
BUN: 11 mg/dL (ref 8–23)
CO2: 23 mmol/L (ref 22–32)
Calcium: 9.1 mg/dL (ref 8.9–10.3)
Chloride: 101 mmol/L (ref 98–111)
Creatinine, Ser: 0.94 mg/dL (ref 0.61–1.24)
GFR, Estimated: >60 mL/min (ref >60)
Glucose, Bld: 250 mg/dL — ABNORMAL HIGH (ref 70–99)
Potassium: 3.8 mmol/L (ref 3.5–5.1)
Sodium: 135 mmol/L (ref 135–145)

## 2020-06-26 LAB — LIPASE, BLOOD: Lipase: 22 U/L (ref 11–51)

## 2020-06-26 LAB — TROPONIN I (HIGH SENSITIVITY)
Troponin I (High Sensitivity): <2 ng/L (ref <18)
Troponin I (High Sensitivity): <2 ng/L (ref <18)

## 2020-06-26 MED ORDER — PREDNISONE 20 MG PO TABS: 40.0000 mg | ORAL_TABLET | Freq: Every day | ORAL | 0 refills | Status: DC

## 2020-06-26 MED ORDER — IPRATROPIUM BROMIDE HFA 17 MCG/ACT IN AERS
2.0000 | INHALATION_SPRAY | Freq: Once | RESPIRATORY_TRACT | Status: AC
  Administered 2020-06-26: 2 via RESPIRATORY_TRACT
  Filled 2020-06-26: qty 12.9

## 2020-06-26 MED ORDER — PROCHLORPERAZINE EDISYLATE 10 MG/2ML IJ SOLN
10.0000 mg | Freq: Once | INTRAMUSCULAR | Status: AC
  Administered 2020-06-26: 10 mg via INTRAVENOUS
  Filled 2020-06-26: qty 2

## 2020-06-26 MED ORDER — ALBUTEROL SULFATE HFA 108 (90 BASE) MCG/ACT IN AERS
4.0000 | INHALATION_SPRAY | Freq: Once | RESPIRATORY_TRACT | Status: AC
  Administered 2020-06-26: 4 via RESPIRATORY_TRACT
  Filled 2020-06-26: qty 6.7

## 2020-06-26 MED ORDER — IOHEXOL 350 MG/ML SOLN
100.0000 mL | Freq: Once | INTRAVENOUS | Status: AC | PRN
  Administered 2020-06-26: 100 mL via INTRAVENOUS

## 2020-06-26 MED ORDER — MECLIZINE HCL 25 MG PO TABS: 25.0000 mg | ORAL_TABLET | Freq: Three times a day (TID) | ORAL | 0 refills | Status: DC | PRN

## 2020-06-26 MED ORDER — MECLIZINE HCL 25 MG PO TABS
25.0000 mg | ORAL_TABLET | Freq: Once | ORAL | Status: AC
  Administered 2020-06-26: 25 mg via ORAL
  Filled 2020-06-26: qty 1

## 2020-06-26 MED ORDER — SODIUM CHLORIDE 0.9 % IV BOLUS
1000.0000 mL | Freq: Once | INTRAVENOUS | Status: AC
  Administered 2020-06-26: 1000 mL via INTRAVENOUS

## 2020-06-26 MED ORDER — DIPHENHYDRAMINE HCL 50 MG/ML IJ SOLN
25.0000 mg | Freq: Once | INTRAMUSCULAR | Status: AC
  Administered 2020-06-26: 25 mg via INTRAVENOUS
  Filled 2020-06-26: qty 1

## 2020-06-26 NOTE — ED Provider Notes (Addendum)
Assumed care of patient at 3:30 PM.  Patient presenting with 2 separate symptoms.  He reports he was most worried about the dizziness, double vision and headache that started yesterday.  He did receive a headache cocktail here and meclizine but reports his symptoms are little better but the dizziness and double vision are still present.  He also complains of blurry vision.  The symptoms are present whether one eye is covered or not.  He reports the headache is significantly better.  He is also had a cough and some pleuritic chest pain and reports that he feels like he needs his inhaler.  His troponin is less than 2 and EKG was reassuring.  He has no evidence of pneumonia, BMP and CBC without acute findings.  D-dimer is within normal limits as well as lipase and low suspicion for an acute cardiac cause of his symptoms today.  Patient does use inhalers at home.  He has some minimal wheezing on my exam and was given albuterol and Atrovent.  Patient's head CT shows no evidence of acute intracranial hemorrhage, mild atherosclerosis of the head and neck without large vessel occlusion significant proximal stenosis or dissection.  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  Uncertain if this is the cause of his symptoms or not.  Will discuss with neurosurgery.  Patient may need MRI.  On my evaluation patient has no evidence of ataxia.  He has no's nystagmus.  He is complaining that he still has double vision in both eyes next to each other.  7:09 PM Spoke with Arnetha Massy with neurosurgery who also spoke with Dr. Arnoldo Morale and they evaluated the patient's CT and aneurysm and do not feel that is the source of his symptoms today.  We will do an MRI to rule out stroke as patient does have risk factors and symptoms are not resolved.  This also could be a viral labyrinthitis.  This was discussed with the patient and he is comfortable with this plan.  8:19 PM MRI without acute findings.  Findings discussed  with pt and given f/u with Dr. Kathyrn Sheriff per Dr. Arnoldo Morale for evaluation in the office for aneurysm.  Will give meclizine to use prn for vertigo but feel may be do to viral labarynthitis.  Will give f/u with neuro and PCP.  8:58 PM Pt reports currently double vision is gone and dizziness is minimal.  No ataxia.  MDM Number of Diagnoses or Management Options   Amount and/or Complexity of Data Reviewed Tests in the radiology section of CPT: reviewed and ordered Obtain history from someone other than the patient: yes Independent visualization of images, tracings, or specimens: yes  Patient Progress Patient progress: stable     Blanchie Dessert, MD 06/26/20 2021    Blanchie Dessert, MD 06/26/20 2059

## 2020-06-26 NOTE — ED Notes (Signed)
MRI Called 

## 2020-06-26 NOTE — Discharge Instructions (Signed)
Continue to drink plenty of fluids.  Use the medication as needed for dizziness.  The MRI today showed no signs of stroke or other abnormalities.  For the aneurysm it is important that you follow up with neurosurgery but they do not think this is the cause of your symptoms today.    The symptoms you are having may be related to a virus.

## 2020-06-26 NOTE — ED Notes (Signed)
Patient has went to MRI

## 2020-06-26 NOTE — ED Triage Notes (Signed)
Pt arrived via walk in, c/o dizziness, chest pain, radiating to left arm, n/v. States this all started yesterday. Also c/o productive cough. Denies any sick contacts.

## 2020-06-26 NOTE — ED Provider Notes (Signed)
[facility]-EMERGENCY DEPT Provider Note   CSN: [id number] Arrival date & time: 06/26/20  1030     History Chief Complaint  Patient presents with  . Chest Pain  . Dizziness    Duane Cuevas is a 64 y.o. male.  The history is provided by the patient and medical records. No language interpreter was used.  Chest Pain Pain location:  Substernal area and L chest Pain quality: aching, pressure and radiating   Pain radiates to:  Does not radiate Pain severity:  Moderate Onset quality:  Gradual Duration:  3 days Timing:  Constant Progression:  Waxing and waning Chronicity:  New Relieved by:  None tried Worsened by:  Coughing and deep breathing Ineffective treatments:  None tried Associated symptoms: cough, dizziness, fatigue, headache, nausea, numbness (subtle in L arm), shortness of breath and vomiting   Associated symptoms: no abdominal pain, no altered mental status, no anorexia, no anxiety, no back pain, no diaphoresis, no fever, no lower extremity edema, no palpitations and no weakness        Past Medical History:  Diagnosis Date  . Diabetes mellitus (HCC) 10/2014   pt denies being diabetic.   . Emphysema of lung (HCC)   . Emphysema/COPD (HCC) 10/2014  . Hepatic steatosis   . Thrombocytopenia (HCC) 09/2015   platelets in 120s.     Patient Active Problem List   Diagnosis Date Noted  . Musculoskeletal chest pain 04/29/2020  . COPD exacerbation (HCC) 04/28/2020  . Acute exacerbation of chronic obstructive pulmonary disease (COPD) (HCC) 04/27/2020  . CAD (coronary artery disease) 01/23/2020  . Hyperlipidemia 11/02/2019  . History of MI (myocardial infarction) 11/02/2019  . Chest pain 11/01/2019  . Cigarette smoker 08/15/2018  . COPD ? GOLD III/ active smoker 08/14/2018  . History of diet-controlled diabetes 06/30/2018  . COPD (chronic obstructive pulmonary disease) (HCC) 10/14/2014  . Type 2 diabetes mellitus with hyperglycemia, without  long-term current use of insulin (HCC) 10/14/2014    Past Surgical History:  Procedure Laterality Date  . ESOPHAGOGASTRODUODENOSCOPY N/A 09/23/2015   Procedure: ESOPHAGOGASTRODUODENOSCOPY (EGD);  Surgeon: Meryl DareMalcolm T Stark, MD;  Location: [facility];  Service: Endoscopy;  Laterality: N/A;  . FOOT SURGERY    . INGUINAL HERNIA REPAIR Left 05/15/2018   Procedure: LEFT INGUINAL HERNIA REPAIR WITH MESH;  Surgeon: Harriette Bouillonornett, Thomas, MD;  Location: [facility] OR;  Service: General;  Laterality: Left;  . INSERTION OF MESH Left 05/15/2018   Procedure: INSERTION OF MESH;  Surgeon: Harriette Bouillonornett, Thomas, MD;  Location: [facility] OR;  Service: General;  Laterality: Left;  . UPPER GASTROINTESTINAL ENDOSCOPY         Family History  Problem Relation Age of Onset  . Emphysema Mother   . Diabetes Mother   . Emphysema Father   . Cancer Sister        Stomach cancer  . Diabetes Sister   . Stomach cancer Sister   . Diabetes Brother   . Colon cancer Neg Hx   . Colon polyps Neg Hx   . Esophageal cancer Neg Hx   . Rectal cancer Neg Hx     Social History   Tobacco Use  . Smoking status: Current Every Day Smoker    Packs/day: 0.15    Years: 47.00    Pack years: 7.05    Types: Cigarettes  . Smokeless tobacco: Never Used  Vaping Use  . Vaping Use: Former  Substance Use Topics  . Alcohol use: Yes    Comment: occ  . Drug  use: No    Home Medications Prior to Admission medications   Medication Sig Start Date End Date Taking? Authorizing Provider  acetaminophen (TYLENOL) 500 MG tablet Take 500 mg by mouth every 6 (six) hours as needed.    [provider]  albuterol (PROVENTIL) (2.5 MG/3ML) 0.083% nebulizer solution Take 3 mLs (2.5 mg total) by nebulization every 4 (four) hours as needed for wheezing or shortness of breath. 06/22/20   Georgina Quint, MD  aspirin EC 81 MG EC tablet Take 1 tablet (81 mg total) by mouth daily. 11/03/19   Hollice Espy, MD  atorvastatin (LIPITOR) 20 MG tablet Take 1  tablet (20 mg total) by mouth daily. 06/22/20   Georgina Quint, MD  budesonide-formoterol [facility]) 160-4.5 MCG/ACT inhaler Inhale 2 puffs into the lungs 2 (two) times daily. 06/22/20   Georgina Quint, MD  glipiZIDE (GLUCOTROL) 5 MG tablet Take 1 tablet (5 mg total) by mouth 2 (two) times daily before a meal. 06/22/20 09/20/20  Georgina Quint, MD  metFORMIN (GLUCOPHAGE) 1000 MG tablet Take 1 tablet (1,000 mg total) by mouth 2 (two) times daily with a meal. 06/22/20 09/20/20  Georgina Quint, MD    Allergies    Patient has no known allergies.  Review of Systems   Review of Systems  Constitutional: Positive for fatigue. Negative for chills, diaphoresis and fever.  HENT: Negative for congestion.   Respiratory: Positive for cough, chest tightness and shortness of breath. Negative for wheezing.   Cardiovascular: Positive for chest pain. Negative for palpitations and leg swelling.  Gastrointestinal: Positive for nausea and vomiting. Negative for abdominal pain, anorexia, constipation and diarrhea.  Genitourinary: Negative for dysuria.  Musculoskeletal: Negative for back pain, neck pain and neck stiffness.  Skin: Negative for rash and wound.  Neurological: Positive for dizziness, light-headedness, numbness (subtle in L arm) and headaches. Negative for syncope, facial asymmetry, speech difficulty and weakness.  Psychiatric/Behavioral: Negative for agitation.  All other systems reviewed and are negative.   Physical Exam Updated Vital Signs BP 129/83   Pulse 92   Temp 98.8 F (37.1 C) (Oral)   Resp 12   Ht 5\' 8"  (1.727 m)   Wt 75.9 kg   SpO2 96%   BMI 25.44 kg/m   Physical Exam Vitals and nursing note reviewed.  Constitutional:      General: He is not in acute distress.    Appearance: He is well-developed and well-nourished. He is not ill-appearing, toxic-appearing or diaphoretic.  HENT:     Head: Normocephalic and atraumatic.  Eyes:     Extraocular  Movements: Extraocular movements intact.     Conjunctiva/sclera: Conjunctivae normal.     Pupils: Pupils are equal, round, and reactive to light.  Cardiovascular:     Rate and Rhythm: Normal rate and regular rhythm.     Heart sounds: Normal heart sounds. No murmur heard.   Pulmonary:     Effort: Pulmonary effort is normal. No respiratory distress.     Breath sounds: Rhonchi present. No decreased breath sounds, wheezing or rales.  Chest:     Chest wall: Tenderness present.  Abdominal:     Palpations: Abdomen is soft.     Tenderness: There is no abdominal tenderness.  Musculoskeletal:        General: No edema.     Cervical back: Neck supple.     Right lower leg: No tenderness. No edema.     Left lower leg: No tenderness. No edema.  Skin:  General: Skin is warm and dry.     Capillary Refill: Capillary refill takes less than 2 seconds.     Findings: No erythema.  Neurological:     General: No focal deficit present.     Mental Status: He is alert and oriented to person, place, and time.     GCS: GCS eye subscore is 4. GCS verbal subscore is 5. GCS motor subscore is 6.     Cranial Nerves: No cranial nerve deficit.     Motor: No weakness, tremor, abnormal muscle tone or seizure activity.     Comments: Patient has normal extraocular movements.  Pupils symmetric and reactive.  He denies diplopia with me but he reports that it comes and goes.  Normal sensation in upper extremities and lower extremities.  Gstrength tenderness in the chest.  No carotid bruit appreciated.  Psychiatric:        Mood and Affect: Mood and affect and mood normal. Mood is not anxious.     ED Results / Procedures / Treatments   Labs (all labs ordered are listed, but only abnormal results are displayed) Labs Reviewed  BASIC METABOLIC PANEL - Abnormal; Notable for the following components:      Result Value   Glucose, Bld 250 (*)    All other components within normal limits  RESP PANEL BY RT-PCR (FLU A&B,  COVID) ARPGX2  CBC  HEPATIC FUNCTION PANEL  LIPASE, BLOOD  D-DIMER, QUANTITATIVE (NOT AT [facility])  TROPONIN I (HIGH SENSITIVITY)  TROPONIN I (HIGH SENSITIVITY)    EKG EKG Interpretation  Date/Time:  Friday June 26 2020 10:53:45 EST Ventricular Rate:  102 PR Interval:    QRS Duration: 91 QT Interval:  356 QTC Calculation: 464 R Axis:   79 Text Interpretation: Sinus tachycardia Biatrial enlargement Probable inferior infarct, old When comapred to prior, faster rate. No STEMI Confirmed by Antony Blackbird [phone]) on 06/26/2020 12:08:09 PM   Radiology CT Angio Head W or Wo Contrast  Result Date: 06/26/2020 CLINICAL DATA:  Severe headache, dizziness, transient diplopia, unsteadiness, and near syncope. Rule out dissection. EXAM: CT ANGIOGRAPHY HEAD AND NECK TECHNIQUE: Multidetector CT imaging of the head and neck was performed using the standard protocol during bolus administration of intravenous contrast. Multiplanar CT image reconstructions and MIPs were obtained to evaluate the vascular anatomy. Carotid stenosis measurements (when applicable) are obtained utilizing NASCET criteria, using the distal internal carotid diameter as the denominator. CONTRAST:  176mL OMNIPAQUE IOHEXOL 350 MG/ML SOLN COMPARISON:  None. FINDINGS: CT HEAD FINDINGS Brain: There is no evidence of an acute infarct, intracranial hemorrhage, mass, midline shift, or extra-axial fluid collection. The ventricles and sulci are within normal limits for age. Vascular: Calcified atherosclerosis at the skull base. Skull: No fracture or suspicious osseous lesion. Sinuses: Mild scattered mucosal thickening in the paranasal sinuses. Clear mastoid air cells. Orbits: Unremarkable. Review of the MIP images confirms the above findings CTA NECK FINDINGS Aortic arch: Standard 3 vessel aortic arch with mild atherosclerotic plaque. Widely patent arch vessel origins. Right carotid system: Patent with mild calcified and soft plaque in the proximal  ICA. No evidence of significant stenosis or dissection. Left carotid system: Patent without evidence of stenosis, dissection, or significant atherosclerosis. Vertebral arteries: Patent and codominant. Mild calcified plaque at the right vertebral artery origin without evidence of a significant stenosis. No evidence of dissection. Skeleton: Mild cervical disc and facet degeneration. Mild, chronic appearing superior endplate compression deformity at T1. Other neck: No evidence of cervical lymphadenopathy or mass. Upper  chest: Moderate centrilobular emphysema. Review of the MIP images confirms the above findings CTA HEAD FINDINGS Anterior circulation: The internal carotid arteries are patent from skull base to carotid termini with mild atherosclerotic plaque bilaterally not resulting in a significant stenosis. A superiorly projecting left paraophthalmic ICA aneurysm measures 2 mm. ACAs and MCAs are patent without evidence of a proximal branch occlusion or significant proximal stenosis. Posterior circulation: The intracranial vertebral arteries are widely patent to the basilar. Patent PICA, AICA, and SCA origins are visualized. The basilar artery is widely patent. There are large right and small left posterior communicating arteries with absence of the right P1 segment. Both PCAs are patent without evidence of a significant proximal stenosis. No aneurysm is identified. Venous sinuses: Patent. Anatomic variants: Fetal right PCA. Review of the MIP images confirms the above findings IMPRESSION: 1. No evidence of acute intracranial abnormality. 2. Mild atherosclerosis in the head and neck without large vessel occlusion, significant proximal stenosis, or dissection. 3. 2 mm left paraophthalmic ICA aneurysm. 4. Aortic Atherosclerosis (ICD10-I70.0) and Emphysema (ICD10-J43.9). Electronically Signed   By: Sebastian Ache M.D.   On: 06/26/2020 15:24   DG Chest 2 View  Result Date: 06/26/2020 CLINICAL DATA:  Cough, chest pain  EXAM: CHEST - 2 VIEW COMPARISON:  06/25/2020 FINDINGS: The heart size and mediastinal contours are within normal limits. Lungs are mildly hyperinflated. No focal airspace consolidation, pleural effusion, or pneumothorax. The visualized skeletal structures are unremarkable. IMPRESSION: No active cardiopulmonary disease. Electronically Signed   By: Duanne Guess D.O.   On: 06/26/2020 12:20   DG Chest 2 View  Result Date: 06/25/2020 CLINICAL DATA:  Chest pain.  Shortness of breath.  Diabetes.  COPD. EXAM: CHEST - 2 VIEW COMPARISON:  04/27/2020 FINDINGS: Mild hyperinflation. Midline trachea. Normal heart size and mediastinal contours. No pleural effusion or pneumothorax. Diffuse interstitial thickening. No lobar consolidation. Suspect at least 1 calcified granuloma in the left lung base. IMPRESSION: No acute cardiopulmonary disease. Mild hyperinflation and interstitial thickening, consistent with COPD/chronic bronchitis. Electronically Signed   By: Jeronimo Greaves M.D.   On: 06/25/2020 12:44   CT Angio Neck W and/or Wo Contrast  Result Date: 06/26/2020 CLINICAL DATA:  Severe headache, dizziness, transient diplopia, unsteadiness, and near syncope. Rule out dissection. EXAM: CT ANGIOGRAPHY HEAD AND NECK TECHNIQUE: Multidetector CT imaging of the head and neck was performed using the standard protocol during bolus administration of intravenous contrast. Multiplanar CT image reconstructions and MIPs were obtained to evaluate the vascular anatomy. Carotid stenosis measurements (when applicable) are obtained utilizing NASCET criteria, using the distal internal carotid diameter as the denominator. CONTRAST:  OMNIPAQUE IOHEXOL 350 MG/ML SOLN COMPARISON:  None. FINDINGS: CT HEAD FINDINGS Brain: There is no evidence of an acute infarct, intracranial hemorrhage, mass, midline shift, or extra-axial fluid collection. The ventricles and sulci are within normal limits for age. Vascular: Calcified atherosclerosis at the  skull base. Skull: No fracture or suspicious osseous lesion. Sinuses: Mild scattered mucosal thickening in the paranasal sinuses. Clear mastoid air cells. Orbits: Unremarkable. Review of the MIP images confirms the above findings CTA NECK FINDINGS Aortic arch: Standard 3 vessel aortic arch with mild atherosclerotic plaque. Widely patent arch vessel origins. Right carotid system: Patent with mild calcified and soft plaque in the proximal ICA. No evidence of significant stenosis or dissection. Left carotid system: Patent without evidence of stenosis, dissection, or significant atherosclerosis. Vertebral arteries: Patent and codominant. Mild calcified plaque at the right vertebral artery origin without evidence of a significant  stenosis. No evidence of dissection. Skeleton: Mild cervical disc and facet degeneration. Mild, chronic appearing superior endplate compression deformity at T1. Other neck: No evidence of cervical lymphadenopathy or mass. Upper chest: Moderate centrilobular emphysema. Review of the MIP images confirms the above findings CTA HEAD FINDINGS Anterior circulation: The internal carotid arteries are patent from skull base to carotid termini with mild atherosclerotic plaque bilaterally not resulting in a significant stenosis. A superiorly projecting left paraophthalmic ICA aneurysm measures 2 mm. ACAs and MCAs are patent without evidence of a proximal branch occlusion or significant proximal stenosis. Posterior circulation: The intracranial vertebral arteries are widely patent to the basilar. Patent PICA, AICA, and SCA origins are visualized. The basilar artery is widely patent. There are large right and small left posterior communicating arteries with absence of the right P1 segment. Both PCAs are patent without evidence of a significant proximal stenosis. No aneurysm is identified. Venous sinuses: Patent. Anatomic variants: Fetal right PCA. Review of the MIP images confirms the above findings  IMPRESSION: 1. No evidence of acute intracranial abnormality. 2. Mild atherosclerosis in the head and neck without large vessel occlusion, significant proximal stenosis, or dissection. 3. 2 mm left paraophthalmic ICA aneurysm. 4. Aortic Atherosclerosis (ICD10-I70.0) and Emphysema (ICD10-J43.9). Electronically Signed   By: Logan Bores M.D.   On: 06/26/2020 15:24    Procedures Procedures (including critical care time)  Medications Ordered in ED Medications  sodium chloride 0.9 % bolus 1,000 mL (1,000 mLs Intravenous New Bag/Given 06/26/20 1346)  prochlorperazine (COMPAZINE) injection 10 mg (10 mg Intravenous Given 06/26/20 1341)  diphenhydrAMINE (BENADRYL) injection 25 mg (25 mg Intravenous Given 06/26/20 1341)  meclizine (ANTIVERT) tablet 25 mg (25 mg Oral Given 06/26/20 1342)  iohexol (OMNIPAQUE) 350 MG/ML injection 100 mL (100 mLs Intravenous Contrast Given 06/26/20 1436)    ED Course  I have reviewed the triage vital signs and the nursing notes.  Pertinent labs & imaging results that were available during my care of the patient were reviewed by me and considered in my medical decision making (see chart for details).    MDM Rules/Calculators/A&P                          Duane Cuevas is a 64 y.o. male with a past medical history significant for COPD, CAD with MI although patient refuses this, diabetes, hyperlipidemia, prior abdominal hernia status post repair with mesh who presents with 2 days of chest pain going down his left arm with left arm numbness, shortness of breath, fatigue, headache, near syncope, intermittent diplopia, dizziness, and malaise.  Patient reports that for the last 2 days, he has had shortness of breath with productive cough.  He reports that he started having chest pain yesterday that is a aching and pressure in his central left chest rating down his left arm causing his left hand to be tingly.  He reports that he has had episodes of nausea and vomiting but that  has been dry heaving with no more emesis.  He denies constipation or diarrhea.  He denies any sick contacts.  He reports he is coughing up phlegm.  Denies hemoptysis.  He reports she is having episodes of severe lightheadedness whenever he stands up as well as dizziness.  He also says that when he stands up his vision starts to get dark and he starts getting diplopia at times as well.  No history of stroke.  No history of cerebrovascular disease or carotid disease in  the chart.  Patient is tachycardic on arrival.  He denies history of DVT or PE and denies leg pain or leg swelling.  Denies long trips recently.  On exam, lungs have some mild coarseness.  Chest is tender in the left chest.  No murmur.  No carotid bruit appreciated.  No focal neurologic deficits with normal sensation and strength in extremities.  Normal pupil exam.  No nystagmus appreciated on my extraocular movement exam.  Patient did get very lightheaded when he sat up for lung auscultation and had to sit back down.  Clinically I am concerned patient is having near syncope and lightheaded episodes in the setting of some dehydration with the nausea and vomiting.  I am concerned about his chest pain going on his left arm with this document history of MI although patient does not report this.  He is also having this severe headache with nausea, vomiting, and intermittent diplopia and worsened symptoms when head is turned.  I am concerned that we need to rule out a cerebrovascular normality including getting CT of his head and neck.  With this chest pain, shortness of breath, tachycardia, will get D-dimer first to make sure he denied a PE study as well.  With his nausea and vomiting we will get labs to assess his liver and pancreas.  We will check for Covid given the ongoing pandemic.  Anticipate reassessment after work-up.  Patient's work-up again returned.  Covid and flu test negative.  Troponin negative.  When compared to yesterday it is still  negative.  BMP is reassuring and CBC is also reassuring.  Glucose is slightly elevated at 250.  Hepatic function is not elevated and lipase nonelevated.  D-dimer is negative, doubt PE.  Chest x-ray shows no pneumonia.  EKG did not show STEMI or arrhythmia.  Patient is waiting on results of CTA head and neck and reassessment to see if the fluids and headache cocktail help with his headache and the meclizine with the dizziness.  I suspect this is a combination of lightheadedness with dehydration leading to vertiginous symptoms.  I also think that his coughing fits have led to some likely musculoskeletal pain in his left chest given his refusal that he has any history of cardiac disease.  Care transferred to oncoming team awaiting for reassessment to help with determine his disposition.  If he is feeling much better, anticipate discharge home.   Final Clinical Impression(s) / ED Diagnoses Final diagnoses:  Dizziness  Atypical chest pain  Lightheadedness  Dehydration  Transient vision disturbance  Cough    Clinical Impression: 1. Dizziness   2. Atypical chest pain   3. Lightheadedness   4. Dehydration   5. Transient vision disturbance   6. Cough     Disposition: Care transferred to oncoming team awaiting for reassessment to help with determine his disposition.  If he is feeling much better, anticipate discharge home.  This note was prepared with assistance of Conservation officer, historic buildings. Occasional wrong-word or sound-a-like substitutions may have occurred due to the inherent limitations of voice recognition software.      Shahrukh Pasch, Canary Brim, MD 06/26/20 1550

## 2020-07-31 ENCOUNTER — Other Ambulatory Visit: Payer: Self-pay | Admitting: Emergency Medicine

## 2020-07-31 NOTE — Telephone Encounter (Signed)
Requested medication (s) are due for refill today - yes  Requested medication (s) are on the active medication list -yes  Future visit scheduled -yes  Last refill: 06/28/20  Notes to clinic: Request non delegated rx  Requested Prescriptions  Pending Prescriptions Disp Refills   meclizine (ANTIVERT) 25 MG tablet [Pharmacy Med Name: MECLIZINE 25 MG TAB] 30 tablet 0    Sig: TAKE ONE TABLET BY MOUTH THREE TIMES A DAY AS NEEDED FOR DIZZINESS      Not Delegated - Gastroenterology: Antiemetics Failed - 07/31/2020  9:51 AM      Failed - This refill cannot be delegated      Passed - Valid encounter within last 6 months    Recent Outpatient Visits           1 month ago Type 2 diabetes mellitus with hyperglycemia, without long-term current use of insulin ([place])   Primary Care at [facility], [place], [place]   8 months ago Type 2 diabetes mellitus with hyperglycemia, without long-term current use of insulin [facility])   Primary Care at [facility], [place], [place]   11 months ago Chronic obstructive pulmonary disease, unspecified COPD type [facility])   Primary Care at [facility], [place], MD   1 year ago Chronic obstructive pulmonary disease, unspecified COPD type [facility])   Primary Care at [facility], [place], [place]   2 years ago Chronic obstructive pulmonary disease, unspecified COPD type [facility])   Primary Care at [facility], [place], [place]       Future Appointments             In 1 month [place], Ines Bloomer, MD Primary Care at [place], [facility]                 Requested Prescriptions  Pending Prescriptions Disp Refills   meclizine (ANTIVERT) 25 MG tablet [Pharmacy Med Name: MECLIZINE 25 MG TAB] 30 tablet 0    Sig: TAKE ONE TABLET BY MOUTH THREE TIMES A DAY AS NEEDED FOR DIZZINESS      Not Delegated - Gastroenterology: Antiemetics Failed - 07/31/2020  9:51 AM      Failed - This refill cannot be delegated      Passed - Valid encounter within last 6 months     Recent Outpatient Visits           1 month ago Type 2 diabetes mellitus with hyperglycemia, without long-term current use of insulin [facility])   Primary Care at [facility], [place], [place]   8 months ago Type 2 diabetes mellitus with hyperglycemia, without long-term current use of insulin [facility])   Primary Care at [facility], [place], [place]   11 months ago Chronic obstructive pulmonary disease, unspecified COPD type [facility])   Primary Care at [facility], [place], [place]   1 year ago Chronic obstructive pulmonary disease, unspecified COPD type [facility])   Primary Care at [facility], [place], [place]   2 years ago Chronic obstructive pulmonary disease, unspecified COPD type [facility])   Primary Care at [facility], Ines Bloomer, MD       Future Appointments             In 1 month [place], Ines Bloomer, MD Primary Care at [place], [facility]

## 2020-08-05 ENCOUNTER — Other Ambulatory Visit: Payer: Self-pay | Admitting: Emergency Medicine

## 2020-08-05 DIAGNOSIS — J449 Chronic obstructive pulmonary disease, unspecified: Secondary | ICD-10-CM

## 2020-08-05 NOTE — Telephone Encounter (Signed)
Requested Prescriptions  Pending Prescriptions Disp Refills  . albuterol (PROVENTIL) (2.5 MG/3ML) 0.083% nebulizer solution [Pharmacy Med Name: ALBUTEROL 0.083%(2.5MG /3ML) 25X3ML] 150 mL 3    Sig: TAKE 3 ML(2.5 MG TOTAL) BY NEBULIZATION EVERY 6(SIX) HOURS AS NEEDED     Pulmonology:  Beta Agonists Failed - 08/05/2020  3:50 AM      Failed - One inhaler should last at least one month. If the patient is requesting refills earlier, contact the patient to check for uncontrolled symptoms.      Passed - Valid encounter within last 12 months    Recent Outpatient Visits          1 month ago Type 2 diabetes mellitus with hyperglycemia, without long-term current use of insulin [facility])   Primary Care at [facility], [place], [place]   8 months ago Type 2 diabetes mellitus with hyperglycemia, without long-term current use of insulin [facility])   Primary Care at [facility], [place], MD   11 months ago Chronic obstructive pulmonary disease, unspecified COPD type [facility])   Primary Care at [facility], Ines Bloomer, MD   1 year ago Chronic obstructive pulmonary disease, unspecified COPD type [facility])   Primary Care at [facility], Ines Bloomer, MD   2 years ago Chronic obstructive pulmonary disease, unspecified COPD type [facility])   Primary Care at [facility], In[place] Bloomer, MD      Future Appointments            In 1 month Sagard[place], Ines Bloomer, MD Primary Care at [place], [facility]

## 2020-08-21 ENCOUNTER — Other Ambulatory Visit: Payer: Self-pay

## 2020-08-21 ENCOUNTER — Ambulatory Visit (INDEPENDENT_AMBULATORY_CARE_PROVIDER_SITE_OTHER): Payer: Medicare HMO | Admitting: Registered Nurse

## 2020-08-21 ENCOUNTER — Encounter: Payer: Self-pay | Admitting: Registered Nurse

## 2020-08-21 VITALS — BP 146/79 | HR 110 | Temp 98.0°F | Resp 18 | Ht 68.0 in | Wt 165.0 lb

## 2020-08-21 DIAGNOSIS — I10 Essential (primary) hypertension: Secondary | ICD-10-CM

## 2020-08-21 DIAGNOSIS — E1165 Type 2 diabetes mellitus with hyperglycemia: Secondary | ICD-10-CM

## 2020-08-21 LAB — GLUCOSE, POCT (MANUAL RESULT ENTRY): POC Glucose: 224 mg/dl — AB (ref 70–99)

## 2020-08-21 LAB — POCT GLYCOSYLATED HEMOGLOBIN (HGB A1C): Hemoglobin A1C: 10.5 % — AB (ref 4.0–5.6)

## 2020-08-21 MED ORDER — AMLODIPINE BESYLATE 5 MG PO TABS: 5.0000 mg | ORAL_TABLET | Freq: Every day | ORAL | 3 refills | Status: DC

## 2020-08-21 MED ORDER — GLIPIZIDE 10 MG PO TABS: 10.0000 mg | ORAL_TABLET | Freq: Two times a day (BID) | ORAL | 3 refills | Status: DC

## 2020-08-21 NOTE — Patient Instructions (Signed)
° ° ° °  If you have lab work done today you will be contacted with your lab results within the next 2 weeks.  If you have not heard from us then please contact us. The fastest way to get your results is to register for My Chart. ° ° °IF you received an x-ray today, you will receive an invoice from [facility]. Please contact [facility] at [phone] with questions or concerns regarding your invoice.  ° °IF you received labwork today, you will receive an invoice from [facility]. Please contact [facility] at [phone] with questions or concerns regarding your invoice.  ° °Our billing staff will not be able to assist you with questions regarding bills from these companies. ° °You will be contacted with the lab results as soon as they are available. The fastest way to get your results is to activate your My Chart account. Instructions are located on the last page of this paperwork. If you have not heard from us regarding the results in 2 weeks, please contact this office. °  ° ° ° °

## 2020-09-10 ENCOUNTER — Emergency Department (HOSPITAL_COMMUNITY)
Admission: EM | Admit: 2020-09-10 | Discharge: 2020-09-10 | Disposition: A | Discharge status: Home / Self Care | Payer: Medicare HMO | Attending: Emergency Medicine | Admitting: Emergency Medicine

## 2020-09-10 ENCOUNTER — Emergency Department (HOSPITAL_COMMUNITY): Payer: Medicare HMO

## 2020-09-10 ENCOUNTER — Encounter (HOSPITAL_COMMUNITY): Payer: Self-pay

## 2020-09-10 DIAGNOSIS — Z20822 Contact with and (suspected) exposure to covid-19: Secondary | ICD-10-CM | POA: Diagnosis not present

## 2020-09-10 DIAGNOSIS — Z7982 Long term (current) use of aspirin: Secondary | ICD-10-CM | POA: Insufficient documentation

## 2020-09-10 DIAGNOSIS — Z7984 Long term (current) use of oral hypoglycemic drugs: Secondary | ICD-10-CM | POA: Diagnosis not present

## 2020-09-10 DIAGNOSIS — Z7951 Long term (current) use of inhaled steroids: Secondary | ICD-10-CM | POA: Insufficient documentation

## 2020-09-10 DIAGNOSIS — R079 Chest pain, unspecified: Secondary | ICD-10-CM | POA: Diagnosis not present

## 2020-09-10 DIAGNOSIS — I2510 Atherosclerotic heart disease of native coronary artery without angina pectoris: Secondary | ICD-10-CM | POA: Insufficient documentation

## 2020-09-10 DIAGNOSIS — E119 Type 2 diabetes mellitus without complications: Secondary | ICD-10-CM | POA: Diagnosis not present

## 2020-09-10 DIAGNOSIS — Z79899 Other long term (current) drug therapy: Secondary | ICD-10-CM | POA: Diagnosis not present

## 2020-09-10 DIAGNOSIS — J441 Chronic obstructive pulmonary disease with (acute) exacerbation: Secondary | ICD-10-CM | POA: Insufficient documentation

## 2020-09-10 DIAGNOSIS — F17210 Nicotine dependence, cigarettes, uncomplicated: Secondary | ICD-10-CM | POA: Insufficient documentation

## 2020-09-10 DIAGNOSIS — R0789 Other chest pain: Secondary | ICD-10-CM | POA: Diagnosis present

## 2020-09-10 LAB — CBC
HCT: 50.6 % (ref 39.0–52.0)
Hemoglobin: 16.7 g/dL (ref 13.0–17.0)
MCH: 31.2 pg (ref 26.0–34.0)
MCHC: 33 g/dL (ref 30.0–36.0)
MCV: 94.4 fL (ref 80.0–100.0)
Platelets: 244 10*3/uL (ref 150–400)
RBC: 5.36 MIL/uL (ref 4.22–5.81)
RDW: 12.2 % (ref 11.5–15.5)
WBC: 10 10*3/uL (ref 4.0–10.5)
nRBC: 0 % (ref 0.0–0.2)

## 2020-09-10 LAB — BASIC METABOLIC PANEL
Anion gap: 14 (ref 5–15)
BUN: 18 mg/dL (ref 8–23)
CO2: 21 mmol/L — ABNORMAL LOW (ref 22–32)
Calcium: 10.2 mg/dL (ref 8.9–10.3)
Chloride: 101 mmol/L (ref 98–111)
Creatinine, Ser: 0.98 mg/dL (ref 0.61–1.24)
GFR, Estimated: >60 mL/min (ref >60)
Glucose, Bld: 221 mg/dL — ABNORMAL HIGH (ref 70–99)
Potassium: 4.2 mmol/L (ref 3.5–5.1)
Sodium: 136 mmol/L (ref 135–145)

## 2020-09-10 LAB — TROPONIN I (HIGH SENSITIVITY)
Troponin I (High Sensitivity): 4 ng/L (ref <18)
Troponin I (High Sensitivity): 4 ng/L (ref <18)

## 2020-09-10 LAB — HEPATIC FUNCTION PANEL
ALT: 39 U/L (ref 0–44)
AST: 23 U/L (ref 15–41)
Albumin: 4.3 g/dL (ref 3.5–5.0)
Alkaline Phosphatase: 70 U/L (ref 38–126)
Bilirubin, Direct: <0.1 mg/dL (ref 0.0–0.2)
Total Bilirubin: 0.4 mg/dL (ref 0.3–1.2)
Total Protein: 7.7 g/dL (ref 6.5–8.1)

## 2020-09-10 LAB — RESP PANEL BY RT-PCR (FLU A&B, COVID) ARPGX2
Influenza A by PCR: NEGATIVE
Influenza B by PCR: NEGATIVE
SARS Coronavirus 2 by RT PCR: NEGATIVE

## 2020-09-10 LAB — D-DIMER, QUANTITATIVE: D-Dimer, Quant: 0.29 ug/mL-FEU (ref 0.00–0.50)

## 2020-09-10 LAB — LIPASE, BLOOD: Lipase: 33 U/L (ref 11–51)

## 2020-09-10 MED ORDER — ALBUTEROL SULFATE (2.5 MG/3ML) 0.083% IN NEBU
2.5000 mg | INHALATION_SOLUTION | Freq: Once | RESPIRATORY_TRACT | Status: AC
  Administered 2020-09-10: 2.5 mg via RESPIRATORY_TRACT
  Filled 2020-09-10: qty 3

## 2020-09-10 MED ORDER — ALUM & MAG HYDROXIDE-SIMETH 200-200-20 MG/5ML PO SUSP
30.0000 mL | Freq: Once | ORAL | Status: AC
  Administered 2020-09-10: 30 mL via ORAL
  Filled 2020-09-10: qty 30

## 2020-09-10 MED ORDER — FENTANYL CITRATE (PF) 100 MCG/2ML IJ SOLN
50.0000 ug | Freq: Once | INTRAMUSCULAR | Status: AC
  Administered 2020-09-10: 50 ug via INTRAVENOUS
  Filled 2020-09-10: qty 2

## 2020-09-10 MED ORDER — LIDOCAINE VISCOUS HCL 2 % MT SOLN
15.0000 mL | Freq: Once | OROMUCOSAL | Status: AC
  Administered 2020-09-10: 15 mL via ORAL
  Filled 2020-09-10: qty 15

## 2020-09-10 MED ORDER — PREDNISONE 20 MG PO TABS: 40.0000 mg | ORAL_TABLET | Freq: Every day | ORAL | 0 refills | Status: DC

## 2020-09-10 MED ORDER — ALBUTEROL SULFATE HFA 108 (90 BASE) MCG/ACT IN AERS
4.0000 | INHALATION_SPRAY | Freq: Once | RESPIRATORY_TRACT | Status: AC
  Administered 2020-09-10: 4 via RESPIRATORY_TRACT
  Filled 2020-09-10: qty 6.7

## 2020-09-10 MED ORDER — METHYLPREDNISOLONE SODIUM SUCC 125 MG IJ SOLR
125.0000 mg | Freq: Once | INTRAMUSCULAR | Status: AC
  Administered 2020-09-10: 125 mg via INTRAVENOUS
  Filled 2020-09-10: qty 2

## 2020-09-10 MED ORDER — HYDROMORPHONE HCL 1 MG/ML IJ SOLN
1.0000 mg | Freq: Once | INTRAMUSCULAR | Status: AC
Start: 2020-09-10 — End: 2020-09-10
  Administered 2020-09-10: 1 mg via INTRAVENOUS
  Filled 2020-09-10: qty 1

## 2020-09-10 MED ORDER — OXYCODONE-ACETAMINOPHEN 5-325 MG PO TABS
1.0000 | ORAL_TABLET | Freq: Once | ORAL | Status: AC
  Administered 2020-09-10: 1 via ORAL
  Filled 2020-09-10: qty 1

## 2020-09-10 MED ORDER — IPRATROPIUM-ALBUTEROL 0.5-2.5 (3) MG/3ML IN SOLN
3.0000 mL | Freq: Once | RESPIRATORY_TRACT | Status: AC
  Administered 2020-09-10: 3 mL via RESPIRATORY_TRACT
  Filled 2020-09-10: qty 3

## 2020-09-10 NOTE — ED Provider Notes (Signed)
[facility] DEPT Provider Note   CSN: [id number] Arrival date & time: 09/10/20  1245     History Chief Complaint  Patient presents with  . Chest Pain    Duane Cuevas is a 65 y.o. male.  Utilized Art therapist.  Presents to ER with concern for chest pain, difficulty breathing.  Patient reports since yesterday he has had progressive chest tightness, shortness of breath.  Feels like he cannot get a deep breath.  Pain is described as moderate to severe, tightness, across chest.  Worse with deep breathing.  States that he took his at home inhaler with minimal relief.  Per review of chart, patient has diabetes, emphysema, COPD, mild thrombocytopenia.  Last admitted for COPD exacerbation/chest pain.  HPI     Past Medical History:  Diagnosis Date  . Diabetes mellitus ([place]) 10/2014   pt denies being diabetic.   . Emphysema of lung ([place])   . Emphysema/COPD ([place]) 10/2014  . Hepatic steatosis   . Thrombocytopenia ([place]) 09/2015   platelets in 120s.     Patient Active Problem List   Diagnosis Date Noted  . Musculoskeletal chest pain 04/29/2020  . COPD exacerbation ([place]) 04/28/2020  . Acute exacerbation of chronic obstructive pulmonary disease (COPD) ([place]) 04/27/2020  . CAD (coronary artery disease) 01/23/2020  . Hyperlipidemia 11/02/2019  . History of MI (myocardial infarction) 11/02/2019  . Chest pain 11/01/2019  . Cigarette smoker 08/15/2018  . COPD ? GOLD III/ active smoker 08/14/2018  . History of diet-controlled diabetes 06/30/2018  . COPD (chronic obstructive pulmonary disease) ([place]) 10/14/2014  . Type 2 diabetes mellitus with hyperglycemia, without long-term current use of insulin ([place]) 10/14/2014    Past Surgical History:  Procedure Laterality Date  . ESOPHAGOGASTRODUODENOSCOPY N/A 09/23/2015   Procedure: ESOPHAGOGASTRODUODENOSCOPY (EGD);  Surgeon: Ladene Artist, MD;  Location: [facility];  Service: Endoscopy;   Laterality: N/A;  . FOOT SURGERY    . INGUINAL HERNIA REPAIR Left 05/15/2018   Procedure: LEFT INGUINAL HERNIA REPAIR WITH MESH;  Surgeon: Erroll Luna, MD;  Location: [facility];  Service: General;  Laterality: Left;  . INSERTION OF MESH Left 05/15/2018   Procedure: INSERTION OF MESH;  Surgeon: Erroll Luna, MD;  Location: [facility];  Service: General;  Laterality: Left;  . UPPER GASTROINTESTINAL ENDOSCOPY         Family History  Problem Relation Age of Onset  . Emphysema Mother   . Diabetes Mother   . Emphysema Father   . Cancer Sister        Stomach cancer  . Diabetes Sister   . Stomach cancer Sister   . Diabetes Brother   . Colon cancer Neg Hx   . Colon polyps Neg Hx   . Esophageal cancer Neg Hx   . Rectal cancer Neg Hx     Social History   Tobacco Use  . Smoking status: Current Every Day Smoker    Packs/day: 0.15    Years: 47.00    Pack years: 7.05    Types: Cigarettes  . Smokeless tobacco: Never Used  Vaping Use  . Vaping Use: Former  Substance Use Topics  . Alcohol use: Yes    Comment: occ  . Drug use: No    Home Medications Prior to Admission medications   Medication Sig Start Date End Date Taking? Authorizing Provider  predniSONE (DELTASONE) 20 MG tablet Take 2 tablets (40 mg total) by mouth daily for 5 days. 09/10/20 09/15/20 Yes Lucrezia Starch, MD  acetaminophen (TYLENOL) 500 MG tablet Take 500 mg by mouth every 6 (six) hours as needed for moderate pain or headache.    [provider]  albuterol (PROVENTIL) (2.5 MG/3ML) 0.083% nebulizer solution TAKE 3 ML(2.5 MG TOTAL) BY NEBULIZATION EVERY 6(SIX) HOURS AS NEEDED 08/05/20   [name], MD  amLODipine (NORVASC) 5 MG tablet Take 1 tablet (5 mg total) by mouth daily. 08/21/20   Maximiano Coss, NP  aspirin EC 81 MG EC tablet Take 1 tablet (81 mg total) by mouth daily. Patient not taking: Reported on 08/21/2020 11/03/19   Annita Brod, MD  atorvastatin (LIPITOR) 20 MG tablet Take 1 tablet  (20 mg total) by mouth daily. 06/22/20   [name], MD  budesonide-formoterol [facility]) 160-4.5 MCG/ACT inhaler Inhale 2 puffs into the lungs 2 (two) times daily. 06/22/20   [name], MD  glipiZIDE (GLUCOTROL) 10 MG tablet Take 1 tablet (10 mg total) by mouth 2 (two) times daily before a meal. 08/21/20   Maximiano Coss, NP  meclizine (ANTIVERT) 25 MG tablet TAKE ONE TABLET BY MOUTH THREE TIMES A DAY AS NEEDED FOR DIZZINESS 07/31/20   [name], MD  metFORMIN (GLUCOPHAGE) 1000 MG tablet Take 1 tablet (1,000 mg total) by mouth 2 (two) times daily with a meal. 06/22/20 09/20/20  Sagard[place], Ines Bloomer, MD  predniSONE (DELTASONE) 20 MG tablet Take 2 tablets (40 mg total) by mouth daily. 06/26/20   Blanchie Dessert, MD    Allergies    Patient has no known allergies.  Review of Systems   Review of Systems  Constitutional: Negative for chills and fever.  HENT: Negative for ear pain and sore throat.   Eyes: Negative for pain and visual disturbance.  Respiratory: Positive for shortness of breath. Negative for cough.   Cardiovascular: Positive for chest pain. Negative for palpitations.  Gastrointestinal: Negative for abdominal pain and vomiting.  Genitourinary: Negative for dysuria and hematuria.  Musculoskeletal: Negative for arthralgias and back pain.  Skin: Negative for color change and rash.  Neurological: Negative for seizures and syncope.  All other systems reviewed and are negative.   Physical Exam Updated Vital Signs BP 109/82   Pulse 89   Temp 98.4 F (36.9 C) (Oral)   Resp (!) 21   Ht 5\' 8"  (1.727 m)   Wt 76.2 kg   SpO2 99%   BMI 25.54 kg/m   Physical Exam Vitals and nursing note reviewed.  Constitutional:      Appearance: He is well-developed.  HENT:     Head: Normocephalic and atraumatic.  Eyes:     Conjunctiva/sclera: Conjunctivae normal.  Cardiovascular:     Rate and Rhythm: Normal rate and regular rhythm.     Heart sounds: No  murmur heard.   Pulmonary:     Comments: No significant tachypnea, not in distress, speaking full sentences, significant expiratory wheezing noted bilaterally, no crackles Abdominal:     Palpations: Abdomen is soft.     Tenderness: There is no abdominal tenderness.  Musculoskeletal:     Cervical back: Neck supple.  Skin:    General: Skin is warm and dry.  Neurological:     General: No focal deficit present.     Mental Status: He is alert.  Psychiatric:        Mood and Affect: Mood normal.        Behavior: Behavior normal.     ED Results / Procedures / Treatments   Labs (all labs ordered are listed,  but only abnormal results are displayed) Labs Reviewed  BASIC METABOLIC PANEL - Abnormal; Notable for the following components:      Result Value   CO2 21 (*)    Glucose, Bld 221 (*)    All other components within normal limits  RESP PANEL BY RT-PCR (FLU A&B, COVID) ARPGX2  CBC  D-DIMER, QUANTITATIVE  HEPATIC FUNCTION PANEL  LIPASE, BLOOD  TROPONIN I (HIGH SENSITIVITY)  TROPONIN I (HIGH SENSITIVITY)    EKG EKG Interpretation  Date/Time:  Thursday September 10 2020 12:54:14 EST Ventricular Rate:  112 PR Interval:    QRS Duration: 95 QT Interval:  320 QTC Calculation: 437 R Axis:   87 Text Interpretation: Sinus tachycardia Biatrial enlargement Borderline right axis deviation 12 Lead; [place] Confirmed by Madalyn Rob [phone]) on 09/10/2020 3:24:53 PM   Radiology DG Chest 2 View  Result Date: 09/10/2020 CLINICAL DATA:  Chest pain. EXAM: CHEST - 2 VIEW COMPARISON:  06/26/2020 FINDINGS: Lungs are hyperexpanded. The lungs are clear without focal pneumonia, edema, pneumothorax or pleural effusion. Calcified granuloma noted left base as confirmed on CT of 01/22/2020. The cardiopericardial silhouette is within normal limits for size. The visualized bony structures of the thorax show no acute abnormality. IMPRESSION: No active cardiopulmonary disease. Electronically Signed    By: Misty Stanley M.D.   On: 09/10/2020 13:39    Procedures Procedures   Medications Ordered in ED Medications  fentaNYL (SUBLIMAZE) injection 50 mcg (50 mcg Intravenous Given 09/10/20 1602)  albuterol (VENTOLIN HFA) 108 (90 Base) MCG/ACT inhaler 4 puff (4 puffs Inhalation Given 09/10/20 1602)  methylPREDNISolone sodium succinate (SOLU-MEDROL) 125 mg/2 mL injection 125 mg (125 mg Intravenous Given 09/10/20 1602)  ipratropium-albuterol (DUONEB) 0.5-2.5 (3) MG/3ML nebulizer solution 3 mL (3 mLs Nebulization Given 09/10/20 1814)  oxyCODONE-acetaminophen (PERCOCET/ROXICET) 5-325 MG per tablet 1 tablet (1 tablet Oral Given 09/10/20 1727)  HYDROmorphone (DILAUDID) injection 1 mg (1 mg Intravenous Given 09/10/20 1845)  alum & mag hydroxide-simeth (MAALOX/MYLANTA) 200-200-20 MG/5ML suspension 30 mL (30 mLs Oral Given 09/10/20 2045)    And  lidocaine (XYLOCAINE) 2 % viscous mouth solution 15 mL (15 mLs Oral Given 09/10/20 2045)  fentaNYL (SUBLIMAZE) injection 50 mcg (50 mcg Intravenous Given 09/10/20 2043)  albuterol (PROVENTIL) (2.5 MG/3ML) 0.083% nebulizer solution 2.5 mg (2.5 mg Nebulization Given 09/10/20 2050)    ED Course  I have reviewed the triage vital signs and the nursing notes.  Pertinent labs & imaging results that were available during my care of the patient were reviewed by me and considered in my medical decision making (see chart for details).    MDM Rules/Calculators/A&P                          65 year old male presenting to ER with concern for chest pain shortness of breath.  On exam, patient well-appearing, noted mild tachycardia but otherwise stable vital signs, on lungs noted significant expiratory wheeze.  Suspected most likely COPD exacerbation.  Provided breathing treatment, steroids.  EKG without acute ischemic changes, troponin within normal limits, doubt ACS.  D-dimer within normal limits, doubt PE.  CXR negative for pneumonia.  After further treatment and observation, his  pain had improved and breathing improved.  Given his current well appearance and improvement in symptoms, stable vital signs, believe he is appropriate for discharge and outpatient management.  Reviewed return precautions and recommended recheck with his primary doctor.   After the discussed management above, the patient was determined to be  safe for discharge.  The patient was in agreement with this plan and all questions regarding their care were answered.  ED return precautions were discussed and the patient will return to the ED with any significant worsening of condition.   Final Clinical Impression(s) / ED Diagnoses Final diagnoses:  Chest pain, unspecified type  COPD exacerbation ([place])    Rx / DC Orders ED Discharge Orders         Ordered    predniSONE (DELTASONE) 20 MG tablet  Daily        09/10/20 2044           Lucrezia Starch, MD 09/11/20 1528

## 2020-09-10 NOTE — Discharge Instructions (Signed)
Please call your primary doctor's office tomorrow morning to schedule a recheck.  If you feel your breathing worsens, you develop recurrent chest pain, return to ER for reevaluation.  Take steroids as prescribed.  Use your albuterol inhaler as needed for wheezing.

## 2020-09-10 NOTE — ED Triage Notes (Signed)
Pt presents with c/o chest pain since yesterday. Pt also reporting some associated shortness of breath. Pt does have a hx of COPD.

## 2020-09-10 NOTE — ED Notes (Signed)
Pt verbalized understanding of d/c, medication, and follow up care.

## 2020-09-13 ENCOUNTER — Emergency Department (HOSPITAL_COMMUNITY): Payer: Medicare HMO

## 2020-09-13 ENCOUNTER — Inpatient Hospital Stay (HOSPITAL_COMMUNITY)
Admission: EM | Admit: 2020-09-13 | Discharge: 2020-09-16 | DRG: 370 | Disposition: A | Discharge status: Home / Self Care | Payer: Medicare HMO | Attending: Internal Medicine | Admitting: Internal Medicine

## 2020-09-13 ENCOUNTER — Other Ambulatory Visit: Payer: Self-pay

## 2020-09-13 ENCOUNTER — Encounter (HOSPITAL_COMMUNITY): Payer: Self-pay | Admitting: Emergency Medicine

## 2020-09-13 DIAGNOSIS — E7800 Pure hypercholesterolemia, unspecified: Secondary | ICD-10-CM | POA: Diagnosis not present

## 2020-09-13 DIAGNOSIS — F17210 Nicotine dependence, cigarettes, uncomplicated: Secondary | ICD-10-CM | POA: Diagnosis present

## 2020-09-13 DIAGNOSIS — T380X5A Adverse effect of glucocorticoids and synthetic analogues, initial encounter: Secondary | ICD-10-CM | POA: Diagnosis present

## 2020-09-13 DIAGNOSIS — R739 Hyperglycemia, unspecified: Secondary | ICD-10-CM | POA: Diagnosis present

## 2020-09-13 DIAGNOSIS — Z7952 Long term (current) use of systemic steroids: Secondary | ICD-10-CM

## 2020-09-13 DIAGNOSIS — Z825 Family history of asthma and other chronic lower respiratory diseases: Secondary | ICD-10-CM | POA: Diagnosis not present

## 2020-09-13 DIAGNOSIS — Z8719 Personal history of other diseases of the digestive system: Secondary | ICD-10-CM | POA: Diagnosis not present

## 2020-09-13 DIAGNOSIS — J441 Chronic obstructive pulmonary disease with (acute) exacerbation: Secondary | ICD-10-CM | POA: Diagnosis not present

## 2020-09-13 DIAGNOSIS — Z833 Family history of diabetes mellitus: Secondary | ICD-10-CM | POA: Diagnosis not present

## 2020-09-13 DIAGNOSIS — R0789 Other chest pain: Secondary | ICD-10-CM | POA: Diagnosis not present

## 2020-09-13 DIAGNOSIS — K259 Gastric ulcer, unspecified as acute or chronic, without hemorrhage or perforation: Secondary | ICD-10-CM | POA: Diagnosis present

## 2020-09-13 DIAGNOSIS — Z7984 Long term (current) use of oral hypoglycemic drugs: Secondary | ICD-10-CM

## 2020-09-13 DIAGNOSIS — B3781 Candidal esophagitis: Secondary | ICD-10-CM | POA: Diagnosis present

## 2020-09-13 DIAGNOSIS — R072 Precordial pain: Secondary | ICD-10-CM | POA: Diagnosis present

## 2020-09-13 DIAGNOSIS — Z7982 Long term (current) use of aspirin: Secondary | ICD-10-CM

## 2020-09-13 DIAGNOSIS — I2510 Atherosclerotic heart disease of native coronary artery without angina pectoris: Secondary | ICD-10-CM | POA: Diagnosis present

## 2020-09-13 DIAGNOSIS — E1165 Type 2 diabetes mellitus with hyperglycemia: Secondary | ICD-10-CM | POA: Diagnosis present

## 2020-09-13 DIAGNOSIS — E785 Hyperlipidemia, unspecified: Secondary | ICD-10-CM | POA: Diagnosis present

## 2020-09-13 DIAGNOSIS — K2950 Unspecified chronic gastritis without bleeding: Secondary | ICD-10-CM | POA: Diagnosis not present

## 2020-09-13 DIAGNOSIS — Z800 Family history of malignant neoplasm of digestive organs: Secondary | ICD-10-CM | POA: Diagnosis not present

## 2020-09-13 DIAGNOSIS — J439 Emphysema, unspecified: Secondary | ICD-10-CM | POA: Diagnosis present

## 2020-09-13 DIAGNOSIS — K229 Disease of esophagus, unspecified: Secondary | ICD-10-CM | POA: Diagnosis not present

## 2020-09-13 DIAGNOSIS — I700 Atherosclerosis of aorta: Secondary | ICD-10-CM | POA: Diagnosis not present

## 2020-09-13 DIAGNOSIS — R109 Unspecified abdominal pain: Secondary | ICD-10-CM | POA: Diagnosis not present

## 2020-09-13 DIAGNOSIS — R1310 Dysphagia, unspecified: Secondary | ICD-10-CM | POA: Diagnosis present

## 2020-09-13 DIAGNOSIS — Z7951 Long term (current) use of inhaled steroids: Secondary | ICD-10-CM | POA: Diagnosis not present

## 2020-09-13 DIAGNOSIS — Z79899 Other long term (current) drug therapy: Secondary | ICD-10-CM

## 2020-09-13 DIAGNOSIS — K8020 Calculus of gallbladder without cholecystitis without obstruction: Secondary | ICD-10-CM | POA: Diagnosis present

## 2020-09-13 DIAGNOSIS — I252 Old myocardial infarction: Secondary | ICD-10-CM | POA: Diagnosis not present

## 2020-09-13 DIAGNOSIS — K319 Disease of stomach and duodenum, unspecified: Secondary | ICD-10-CM | POA: Diagnosis present

## 2020-09-13 DIAGNOSIS — K3189 Other diseases of stomach and duodenum: Secondary | ICD-10-CM | POA: Diagnosis not present

## 2020-09-13 DIAGNOSIS — I10 Essential (primary) hypertension: Secondary | ICD-10-CM | POA: Diagnosis present

## 2020-09-13 DIAGNOSIS — K253 Acute gastric ulcer without hemorrhage or perforation: Secondary | ICD-10-CM

## 2020-09-13 DIAGNOSIS — K209 Esophagitis, unspecified without bleeding: Secondary | ICD-10-CM

## 2020-09-13 DIAGNOSIS — Z20822 Contact with and (suspected) exposure to covid-19: Secondary | ICD-10-CM | POA: Diagnosis present

## 2020-09-13 DIAGNOSIS — R1319 Other dysphagia: Secondary | ICD-10-CM | POA: Diagnosis present

## 2020-09-13 DIAGNOSIS — R079 Chest pain, unspecified: Secondary | ICD-10-CM | POA: Diagnosis not present

## 2020-09-13 LAB — BASIC METABOLIC PANEL
Anion gap: 16 — ABNORMAL HIGH (ref 5–15)
BUN: 31 mg/dL — ABNORMAL HIGH (ref 8–23)
CO2: 22 mmol/L (ref 22–32)
Calcium: 9.5 mg/dL (ref 8.9–10.3)
Chloride: 95 mmol/L — ABNORMAL LOW (ref 98–111)
Creatinine, Ser: 1.26 mg/dL — ABNORMAL HIGH (ref 0.61–1.24)
GFR, Estimated: >60 mL/min (ref >60)
Glucose, Bld: 702 mg/dL (ref 70–99)
Potassium: 4.7 mmol/L (ref 3.5–5.1)
Sodium: 133 mmol/L — ABNORMAL LOW (ref 135–145)

## 2020-09-13 LAB — URINALYSIS, ROUTINE W REFLEX MICROSCOPIC
Bacteria, UA: NONE SEEN
Bilirubin Urine: NEGATIVE
Glucose, UA: >=500 mg/dL — AB
Hgb urine dipstick: NEGATIVE
Ketones, ur: 5 mg/dL — AB
Leukocytes,Ua: NEGATIVE
Nitrite: NEGATIVE
Protein, ur: NEGATIVE mg/dL
Specific Gravity, Urine: 1.022 (ref 1.005–1.030)
pH: 5 (ref 5.0–8.0)

## 2020-09-13 LAB — CBC
HCT: 46.7 % (ref 39.0–52.0)
Hemoglobin: 15.7 g/dL (ref 13.0–17.0)
MCH: 31.8 pg (ref 26.0–34.0)
MCHC: 33.6 g/dL (ref 30.0–36.0)
MCV: 94.5 fL (ref 80.0–100.0)
Platelets: 232 10*3/uL (ref 150–400)
RBC: 4.94 MIL/uL (ref 4.22–5.81)
RDW: 12.1 % (ref 11.5–15.5)
WBC: 10.2 10*3/uL (ref 4.0–10.5)
nRBC: 0 % (ref 0.0–0.2)

## 2020-09-13 LAB — BLOOD GAS, VENOUS
Acid-base deficit: 3.3 mmol/L — ABNORMAL HIGH (ref 0.0–2.0)
Bicarbonate: 20.9 mmol/L (ref 20.0–28.0)
FIO2: 21
O2 Saturation: 88.7 %
Patient temperature: 98.6
pCO2, Ven: 36.4 mmHg — ABNORMAL LOW (ref 44.0–60.0)
pH, Ven: 7.376 (ref 7.250–7.430)
pO2, Ven: 58.9 mmHg — ABNORMAL HIGH (ref 32.0–45.0)

## 2020-09-13 LAB — CBG MONITORING, ED
Glucose-Capillary: 527 mg/dL (ref 70–99)
Glucose-Capillary: 558 mg/dL (ref 70–99)
Glucose-Capillary: 576 mg/dL (ref 70–99)
Glucose-Capillary: >600 mg/dL (ref 70–99)

## 2020-09-13 LAB — TROPONIN I (HIGH SENSITIVITY)
Troponin I (High Sensitivity): 12 ng/L (ref <18)
Troponin I (High Sensitivity): 11 ng/L (ref <18)

## 2020-09-13 LAB — BRAIN NATRIURETIC PEPTIDE: B Natriuretic Peptide: 113.2 pg/mL — ABNORMAL HIGH (ref 0.0–100.0)

## 2020-09-13 LAB — D-DIMER, QUANTITATIVE: D-Dimer, Quant: <0.27 ug/mL-FEU (ref 0.00–0.50)

## 2020-09-13 MED ORDER — ATORVASTATIN CALCIUM 10 MG PO TABS
20.0000 mg | ORAL_TABLET | Freq: Every day | ORAL | Status: DC
  Administered 2020-09-14 – 2020-09-15 (×2): 20 mg via ORAL
  Filled 2020-09-13 (×2): qty 2

## 2020-09-13 MED ORDER — INSULIN REGULAR(HUMAN) IN NACL 100-0.9 UT/100ML-% IV SOLN
INTRAVENOUS | Status: DC
  Administered 2020-09-13: 14 [IU]/h via INTRAVENOUS
  Administered 2020-09-14: 8 [IU]/h via INTRAVENOUS
  Filled 2020-09-13: qty 100

## 2020-09-13 MED ORDER — DEXAMETHASONE SODIUM PHOSPHATE 10 MG/ML IJ SOLN
10.0000 mg | Freq: Once | INTRAMUSCULAR | Status: AC
  Administered 2020-09-13: 10 mg via INTRAVENOUS
  Filled 2020-09-13: qty 1

## 2020-09-13 MED ORDER — DEXTROSE IN LACTATED RINGERS 5 % IV SOLN: INTRAVENOUS | Status: DC

## 2020-09-13 MED ORDER — ALBUTEROL SULFATE HFA 108 (90 BASE) MCG/ACT IN AERS
8.0000 | INHALATION_SPRAY | RESPIRATORY_TRACT | Status: DC
  Administered 2020-09-14 (×5): 8 via RESPIRATORY_TRACT
  Filled 2020-09-13 (×2): qty 6.7

## 2020-09-13 MED ORDER — SODIUM CHLORIDE 0.9 % IV BOLUS
1000.0000 mL | Freq: Once | INTRAVENOUS | Status: AC
  Administered 2020-09-13: 1000 mL via INTRAVENOUS

## 2020-09-13 MED ORDER — LACTATED RINGERS IV SOLN: INTRAVENOUS | Status: DC

## 2020-09-13 MED ORDER — DEXTROSE 50 % IV SOLN: 0.0000 mL | INTRAVENOUS | Status: DC | PRN

## 2020-09-13 MED ORDER — ENOXAPARIN SODIUM 40 MG/0.4ML ~~LOC~~ SOLN
40.0000 mg | SUBCUTANEOUS | Status: DC
  Administered 2020-09-14 – 2020-09-16 (×3): 40 mg via SUBCUTANEOUS
  Filled 2020-09-13 (×3): qty 0.4

## 2020-09-13 MED ORDER — ALUM & MAG HYDROXIDE-SIMETH 200-200-20 MG/5ML PO SUSP
30.0000 mL | Freq: Once | ORAL | Status: AC
  Administered 2020-09-13: 30 mL via ORAL
  Filled 2020-09-13: qty 30

## 2020-09-13 MED ORDER — INSULIN REGULAR(HUMAN) IN NACL 100-0.9 UT/100ML-% IV SOLN
INTRAVENOUS | Status: DC
  Administered 2020-09-13: 13 [IU]/h via INTRAVENOUS
  Filled 2020-09-13: qty 100

## 2020-09-13 MED ORDER — MOMETASONE FURO-FORMOTEROL FUM 200-5 MCG/ACT IN AERO
2.0000 | INHALATION_SPRAY | Freq: Two times a day (BID) | RESPIRATORY_TRACT | Status: DC
  Administered 2020-09-14 – 2020-09-16 (×6): 2 via RESPIRATORY_TRACT
  Filled 2020-09-13 (×2): qty 8.8

## 2020-09-13 MED ORDER — AMLODIPINE BESYLATE 5 MG PO TABS
5.0000 mg | ORAL_TABLET | Freq: Every day | ORAL | Status: DC
  Administered 2020-09-14 – 2020-09-15 (×2): 5 mg via ORAL
  Filled 2020-09-13 (×2): qty 1

## 2020-09-13 MED ORDER — IPRATROPIUM-ALBUTEROL 0.5-2.5 (3) MG/3ML IN SOLN
3.0000 mL | Freq: Once | RESPIRATORY_TRACT | Status: AC
  Administered 2020-09-13: 3 mL via RESPIRATORY_TRACT
  Filled 2020-09-13: qty 3

## 2020-09-13 NOTE — ED Triage Notes (Signed)
Patient reports left chest pain with SOB x1 hour. Seen for same on 3/10. Hx COPD.

## 2020-09-13 NOTE — H&P (Signed)
History and Physical    Duane Cuevas NOM:[id number] DOB: [birth year] DOA: 09/13/2020  PCP: [name], MD  Patient coming from: Home.  Chief Complaint: Chest pain.  HPI: Duane Cuevas is a 65 y.o. male with history of COPD, ongoing tobacco abuse, diabetes mellitus type 2, hypertension presents to the ER for the second time in the last 5 days because of chest pain.  When he came around 5 days ago patient had chest pain and shortness of breath was treated for COPD and discharged home.  Patient states he did well after that but the chest pain started coming again over the last 24 hours mostly in the center of the chest and epigastric area.  Had some nausea and vomiting.  Denies any diarrhea fever chills productive cough.  Chest pain is mostly constant sometimes burning sensations intense pressure-like.  ED Course: In the ER chest x-ray was unremarkable showing emphysematous changes EKG showing normal sinus rhythm but I sensitive troponin was negative.  Labs are significant for blood glucose of 702 with anion gap of 16 creatinine 1.2 which increased from 1.9 few days ago.  Patient was started on insulin infusion and admitted for further management of chest pain and uncontrolled diabetes.  Uncontrolled diabetes likely from recent use of prednisone.  On exam patient does have epigastric and right upper quadrant tenderness.  Review of Systems: As per HPI, rest all negative.   Past Medical History:  Diagnosis Date  . Diabetes mellitus ([place]) 10/2014   pt denies being diabetic.   . Emphysema of lung ([place])   . Emphysema/COPD ([place]) 10/2014  . Hepatic steatosis   . Thrombocytopenia ([place]) 09/2015   platelets in 120s.     Past Surgical History:  Procedure Laterality Date  . ESOPHAGOGASTRODUODENOSCOPY N/A 09/23/2015   Procedure: ESOPHAGOGASTRODUODENOSCOPY (EGD);  Surgeon: Ladene Artist, MD;  Location: [facility];  Service: Endoscopy;  Laterality: N/A;  . FOOT SURGERY    .  INGUINAL HERNIA REPAIR Left 05/15/2018   Procedure: LEFT INGUINAL HERNIA REPAIR WITH MESH;  Surgeon: Erroll Luna, MD;  Location: [place];  Service: General;  Laterality: Left;  . INSERTION OF MESH Left 05/15/2018   Procedure: INSERTION OF MESH;  Surgeon: Erroll Luna, MD;  Location: [place];  Service: General;  Laterality: Left;  . UPPER GASTROINTESTINAL ENDOSCOPY       reports that he has been smoking cigarettes. He has a 7.05 pack-year smoking history. He has never used smokeless tobacco. He reports current alcohol use. He reports that he does not use drugs.  No Known Allergies  Family History  Problem Relation Age of Onset  . Emphysema Mother   . Diabetes Mother   . Emphysema Father   . Cancer Sister        Stomach cancer  . Diabetes Sister   . Stomach cancer Sister   . Diabetes Brother   . Colon cancer Neg Hx   . Colon polyps Neg Hx   . Esophageal cancer Neg Hx   . Rectal cancer Neg Hx     Prior to Admission medications   Medication Sig Start Date End Date Taking? Authorizing Provider  acetaminophen (TYLENOL) 500 MG tablet Take 1,000 mg by mouth every 6 (six) hours as needed for moderate pain or headache.   Yes [provider]  albuterol (PROVENTIL) (2.5 MG/3ML) 0.083% nebulizer solution TAKE 3 ML(2.5 MG TOTAL) BY NEBULIZATION EVERY 6(SIX) HOURS AS NEEDED Patient taking differently: Take 2.5 mg by nebulization every 6 (six) hours  as needed for wheezing or shortness of breath. 08/05/20  Yes Sagardia, Duane Bloomer, MD  amLODipine (NORVASC) 5 MG tablet Take 1 tablet (5 mg total) by mouth daily. 08/21/20  Yes Maximiano Coss, NP  atorvastatin (LIPITOR) 20 MG tablet Take 1 tablet (20 mg total) by mouth daily. 06/22/20  Yes Sagardia, Duane Bloomer, MD  budesonide-formoterol [facility]) 160-4.5 MCG/ACT inhaler Inhale 2 puffs into the lungs 2 (two) times daily. 06/22/20  Yes Sagardia, Duane Bloomer, MD  glipiZIDE (GLUCOTROL) 10 MG tablet Take 1 tablet (10 mg total) by mouth 2 (two)  times daily before a meal. 08/21/20  Yes Maximiano Coss, NP  meclizine (ANTIVERT) 25 MG tablet TAKE ONE TABLET BY MOUTH THREE TIMES A DAY AS NEEDED FOR DIZZINESS Patient taking differently: Take 25 mg by mouth daily as needed for dizziness or nausea. 07/31/20  Yes [name], MD  metFORMIN (GLUCOPHAGE) 1000 MG tablet Take 1 tablet (1,000 mg total) by mouth 2 (two) times daily with a meal. 06/22/20 09/20/20 Yes Sagardia, Duane Bloomer, MD  predniSONE (DELTASONE) 20 MG tablet Take 2 tablets (40 mg total) by mouth daily. Patient taking differently: Take 40 mg by mouth daily as needed (pain). 06/26/20  Yes Blanchie Dessert, MD  aspirin EC 81 MG EC tablet Take 1 tablet (81 mg total) by mouth daily. Patient not taking: No sig reported 11/03/19   Annita Brod, MD  predniSONE (DELTASONE) 20 MG tablet Take 2 tablets (40 mg total) by mouth daily for 5 days. Patient not taking: No sig reported 09/10/20 09/15/20  Duane Starch, MD    Physical Exam: Constitutional: Moderately built and nourished. Vitals:   09/13/20 2030 09/13/20 2100 09/13/20 2130 09/13/20 2213  BP: 128/77 138/84 127/90 137/81  Pulse: (!) 103 100 90 90  Resp: (!) 24 (!) 27 (!) 25 (!) 26  Temp:      TempSrc:      SpO2: 94% 95% 97% 95%   Eyes: Anicteric no pallor. ENMT: No discharge from the ears eyes nose or mouth. Neck: No mass felt.  No neck rigidity. Respiratory: No rhonchi or crepitations. Cardiovascular: S1-S2 heard. Abdomen: Right upper quadrant and epigastric tenderness but no guarding or rigidity. Musculoskeletal: No edema. Skin: No rash. Neurologic: Alert awake oriented to time place and person.  Moves all extremities. Psychiatric: Appears normal.  Normal affect.   Labs on Admission: I have personally reviewed following labs and imaging studies  CBC: Recent Labs  Lab 09/10/20 1304 09/13/20 1943  WBC 10.0 10.2  HGB 16.7 15.7  HCT 50.6 46.7  MCV 94.4 94.5  PLT 244 761   Basic Metabolic  Panel: Recent Labs  Lab 09/10/20 1304 09/13/20 1943  NA 136 133*  K 4.2 4.7  CL 101 95*  CO2 21* 22  GLUCOSE 221* 702*  BUN 18 31*  CREATININE 0.98 1.26*  CALCIUM 10.2 9.5   GFR: Estimated Creatinine Clearance: 56.5 mL/min (A) (by C-G formula based on SCr of 1.26 mg/dL (H)). Liver Function Tests: Recent Labs  Lab 09/10/20 1454  AST 23  ALT 39  ALKPHOS 70  BILITOT 0.4  PROT 7.7  ALBUMIN 4.3   Recent Labs  Lab 09/10/20 1454  LIPASE 33   No results for input(s): AMMONIA in the last 168 hours. Coagulation Profile: No results for input(s): INR, PROTIME in the last 168 hours. Cardiac Enzymes: No results for input(s): CKTOTAL, CKMB, CKMBINDEX, TROPONINI in the last 168 hours. BNP (last 3 results) No results for input(s): PROBNP in the  last 8760 hours. HbA1C: No results for input(s): HGBA1C in the last 72 hours. CBG: Recent Labs  Lab 09/13/20 1931 09/13/20 2231  GLUCAP >600* 576*   Lipid Profile: No results for input(s): CHOL, HDL, LDLCALC, TRIG, CHOLHDL, LDLDIRECT in the last 72 hours. Thyroid Function Tests: No results for input(s): TSH, T4TOTAL, FREET4, T3FREE, THYROIDAB in the last 72 hours. Anemia Panel: No results for input(s): VITAMINB12, FOLATE, FERRITIN, TIBC, IRON, RETICCTPCT in the last 72 hours. Urine analysis:    Component Value Date/Time   COLORURINE YELLOW 09/23/2015 0616   APPEARANCEUR CLEAR 09/23/2015 0616   LABSPEC 1.019 09/23/2015 0616   PHURINE 7.0 09/23/2015 0616   GLUCOSEU NEGATIVE 09/23/2015 0616   HGBUR TRACE (A) 09/23/2015 0616   BILIRUBINUR NEGATIVE 09/23/2015 0616   KETONESUR NEGATIVE 09/23/2015 0616   PROTEINUR 100 (A) 09/23/2015 0616   UROBILINOGEN 0.2 10/14/2014 1712   NITRITE NEGATIVE 09/23/2015 0616   LEUKOCYTESUR NEGATIVE 09/23/2015 0616   Sepsis Labs: @LABRCNTIP (procalcitonin:4,lacticidven:4) ) Recent Results (from the past 240 hour(s))  Resp Panel by RT-PCR (Flu A&B, Covid) Nasopharyngeal Swab     Status: None    Collection Time: 09/10/20  3:55 PM   Specimen: Nasopharyngeal Swab; Nasopharyngeal(NP) swabs in vial transport medium  Result Value Ref Range Status   SARS Coronavirus 2 by RT PCR NEGATIVE NEGATIVE Final    Comment: (NOTE) SARS-CoV-2 target nucleic acids are NOT DETECTED.  The SARS-CoV-2 RNA is generally detectable in upper respiratory specimens during the acute phase of infection. The lowest concentration of SARS-CoV-2 viral copies this assay can detect is 138 copies/mL. A negative result does not preclude SARS-Cov-2 infection and should not be used as the sole basis for treatment or other patient management decisions. A negative result may occur with  improper specimen collection/handling, submission of specimen other than nasopharyngeal swab, presence of viral mutation(s) within the areas targeted by this assay, and inadequate number of viral copies(<138 copies/mL). A negative result must be combined with clinical observations, patient history, and epidemiological information. The expected result is Negative.  Fact Sheet for Patients:  EntrepreneurPulse.com.au  Fact Sheet for Healthcare Providers:  IncredibleEmployment.be  This test is no t yet approved or cleared by the [place] FDA and  has been authorized for detection and/or diagnosis of SARS-CoV-2 by FDA under an Emergency Use Authorization (EUA). This EUA will remain  in effect (meaning this test can be used) for the duration of the COVID-19 declaration under Section 564(b)(1) of the Act, 21 U.S.C.section 360bbb-3(b)(1), unless the authorization is terminated  or revoked sooner.       Influenza A by PCR NEGATIVE NEGATIVE Final   Influenza B by PCR NEGATIVE NEGATIVE Final    Comment: (NOTE) The Xpert Xpress SARS-CoV-2/FLU/RSV plus assay is intended as an aid in the diagnosis of influenza from Nasopharyngeal swab specimens and should not be used as a sole basis for treatment.  Nasal washings and aspirates are unacceptable for Xpert Xpress SARS-CoV-2/FLU/RSV testing.  Fact Sheet for Patients: EntrepreneurPulse.com.au  Fact Sheet for Healthcare Providers: IncredibleEmployment.be  This test is not yet approved or cleared by the [place] FDA and has been authorized for detection and/or diagnosis of SARS-CoV-2 by FDA under an Emergency Use Authorization (EUA). This EUA will remain in effect (meaning this test can be used) for the duration of the COVID-19 declaration under Section 564(b)(1) of the Act, 21 U.S.C. section 360bbb-3(b)(1), unless the authorization is terminated or revoked.  Performed at [facility], [address] [place], [place]  [place] on Admission: DG Chest 2 View  Result Date: 09/13/2020 CLINICAL DATA:  Chest pain EXAM: CHEST - 2 VIEW COMPARISON:  09/10/2020, CT 01/22/2020 FINDINGS: Emphysematous disease. No focal opacity or pleural effusion. Stable cardiomediastinal silhouette. No pneumothorax. Calcified granuloma left lung base. IMPRESSION: No acute changes compared to prior.  Emphysematous disease Electronically Signed   By: Donavan Foil M.D.   On: 09/13/2020 20:17    EKG: Independently reviewed.  Normal sinus rhythm.  Assessment/Plan Active Problems:   CAD (coronary artery disease)   Acute exacerbation of chronic obstructive pulmonary disease (COPD) ([place])   Hyperglycemia   Uncontrolled type 2 diabetes mellitus with hyperglycemia ([place])    1. Chest pain/right upper quadrant and epigastric tenderness -patient had a low risk stress test done last year.  At this time since patient has epigastric and right upper quadrant tenderness with nausea vomiting pain related to food will check sonogram of the abdomen to rule out any acute cholecystitis.  We will keep patient n.p.o. and also consult cardiology.  Aspirin.  Trend cardiac markers.  LFTs and lipase are  pending.  D-dimer is negative. 2. Uncontrolled diabetes mellitus with hyperglycemia likely from the recent use of prednisone.  On insulin infusion.  Will place patient on Lantus once CBG less than 250. 3. Hypertension on amlodipine. 4. COPD not actively wheezing. 5. Hyperlipidemia on statins.  Covid test is pending.   DVT prophylaxis: Lovenox. Code Status: Full code. Family Communication: Discussed with patient. Disposition Plan: Home. Consults called: Cardiology. Admission status: Observation.   Rise Patience MD [facility] Pager [phone].  If 7PM-7AM, please contact night-coverage [email or web address] Password [facility]  09/13/2020, 11:06 PM

## 2020-09-13 NOTE — ED Provider Notes (Signed)
[place] DEPT Provider Note   CSN: [id number] Arrival date & time: 09/13/20  1920     History Chief Complaint  Patient presents with  . Chest Pain  . Shortness of Breath    Duane Cuevas is a 65 y.o. male.  The patient was here earlier this week.  He was evaluated for ACS, PE, and pneumonia.  He was discharged with a prescription of prednisone.  He is back with a recurrence of symptoms.  The history is provided by the patient.  Chest Pain Pain location:  L chest Pain quality: pressure   Pain radiates to:  L arm Pain severity:  Moderate Onset quality:  Sudden Timing:  Constant Progression:  Unchanged Chronicity:  New Context: at rest   Relieved by:  Nothing Worsened by:  Nothing Ineffective treatments:  None tried Associated symptoms: cough (at baseline) and shortness of breath   Associated symptoms: no abdominal pain, no back pain, no diaphoresis, no fever, no lower extremity edema, no near-syncope, no palpitations, no syncope, no vomiting and no weakness   Risk factors: coronary artery disease, diabetes mellitus, high cholesterol and smoking   Shortness of Breath Associated symptoms: chest pain and cough (at baseline)   Associated symptoms: no abdominal pain, no diaphoresis, no ear pain, no fever, no rash, no sore throat, no syncope and no vomiting        Past Medical History:  Diagnosis Date  . Diabetes mellitus ([place]) 10/2014   pt denies being diabetic.   . Emphysema of lung ([place])   . Emphysema/COPD ([employer]) 10/2014  . Hepatic steatosis   . Thrombocytopenia ([place]) 09/2015   platelets in 120s.     Patient Active Problem List   Diagnosis Date Noted  . Musculoskeletal chest pain 04/29/2020  . COPD exacerbation ([place]) 04/28/2020  . Acute exacerbation of chronic obstructive pulmonary disease (COPD) ([place]) 04/27/2020  . CAD (coronary artery disease) 01/23/2020  . Hyperlipidemia 11/02/2019  . History of MI (myocardial infarction)  11/02/2019  . Chest pain 11/01/2019  . Cigarette smoker 08/15/2018  . COPD ? GOLD III/ active smoker 08/14/2018  . History of diet-controlled diabetes 06/30/2018  . COPD (chronic obstructive pulmonary disease) ([place]) 10/14/2014  . Type 2 diabetes mellitus with hyperglycemia, without long-term current use of insulin ([place]) 10/14/2014    Past Surgical History:  Procedure Laterality Date  . ESOPHAGOGASTRODUODENOSCOPY N/A 09/23/2015   Procedure: ESOPHAGOGASTRODUODENOSCOPY (EGD);  Surgeon: Ladene Artist, MD;  Location: [facility];  Service: Endoscopy;  Laterality: N/A;  . FOOT SURGERY    . INGUINAL HERNIA REPAIR Left 05/15/2018   Procedure: LEFT INGUINAL HERNIA REPAIR WITH MESH;  Surgeon: Erroll Luna, MD;  Location: [place];  Service: General;  Laterality: Left;  . INSERTION OF MESH Left 05/15/2018   Procedure: INSERTION OF MESH;  Surgeon: Erroll Luna, MD;  Location: [place];  Service: General;  Laterality: Left;  . UPPER GASTROINTESTINAL ENDOSCOPY         Family History  Problem Relation Age of Onset  . Emphysema Mother   . Diabetes Mother   . Emphysema Father   . Cancer Sister        Stomach cancer  . Diabetes Sister   . Stomach cancer Sister   . Diabetes Brother   . Colon cancer Neg Hx   . Colon polyps Neg Hx   . Esophageal cancer Neg Hx   . Rectal cancer Neg Hx     Social History   Tobacco Use  . Smoking  status: Current Every Day Smoker    Packs/day: 0.15    Years: 47.00    Pack years: 7.05    Types: Cigarettes  . Smokeless tobacco: Never Used  Vaping Use  . Vaping Use: Former  Substance Use Topics  . Alcohol use: Yes    Comment: occ  . Drug use: No    Home Medications Prior to Admission medications   Medication Sig Start Date End Date Taking? Authorizing Provider  acetaminophen (TYLENOL) 500 MG tablet Take 500 mg by mouth every 6 (six) hours as needed for moderate pain or headache.    [provider]  albuterol (PROVENTIL) (2.5 MG/3ML) 0.083%  nebulizer solution TAKE 3 ML(2.5 MG TOTAL) BY NEBULIZATION EVERY 6(SIX) HOURS AS NEEDED 08/05/20   [name], MD  amLODipine (NORVASC) 5 MG tablet Take 1 tablet (5 mg total) by mouth daily. 08/21/20   Maximiano Coss, NP  aspirin EC 81 MG EC tablet Take 1 tablet (81 mg total) by mouth daily. Patient not taking: Reported on 08/21/2020 11/03/19   Annita Brod, MD  atorvastatin (LIPITOR) 20 MG tablet Take 1 tablet (20 mg total) by mouth daily. 06/22/20   [name], MD  budesonide-formoterol [facility]) 160-4.5 MCG/ACT inhaler Inhale 2 puffs into the lungs 2 (two) times daily. 06/22/20   [name], MD  glipiZIDE (GLUCOTROL) 10 MG tablet Take 1 tablet (10 mg total) by mouth 2 (two) times daily before a meal. 08/21/20   Maximiano Coss, NP  meclizine (ANTIVERT) 25 MG tablet TAKE ONE TABLET BY MOUTH THREE TIMES A DAY AS NEEDED FOR DIZZINESS 07/31/20   [name], MD  metFORMIN (GLUCOPHAGE) 1000 MG tablet Take 1 tablet (1,000 mg total) by mouth 2 (two) times daily with a meal. 06/22/20 09/20/20  Sagardia, Ines Bloomer, MD  predniSONE (DELTASONE) 20 MG tablet Take 2 tablets (40 mg total) by mouth daily. 06/26/20   Blanchie Dessert, MD  predniSONE (DELTASONE) 20 MG tablet Take 2 tablets (40 mg total) by mouth daily for 5 days. 09/10/20 09/15/20  Lucrezia Starch, MD    Allergies    Patient has no known allergies.  Review of Systems   Review of Systems  Constitutional: Negative for chills, diaphoresis and fever.  HENT: Negative for ear pain and sore throat.   Eyes: Negative for pain and visual disturbance.  Respiratory: Positive for cough (at baseline) and shortness of breath.   Cardiovascular: Positive for chest pain. Negative for palpitations, syncope and near-syncope.  Gastrointestinal: Negative for abdominal pain and vomiting.  Genitourinary: Negative for dysuria and hematuria.  Musculoskeletal: Negative for arthralgias and back pain.  Skin: Negative for  color change and rash.  Neurological: Negative for seizures, syncope and weakness.  All other systems reviewed and are negative.   Physical Exam Updated Vital Signs BP 121/88   Pulse (!) 105   Temp 98.1 F (36.7 C) (Oral)   Resp (!) 29   SpO2 98%   Physical Exam Vitals and nursing note reviewed.  Constitutional:      Appearance: He is well-developed.  HENT:     Head: Normocephalic and atraumatic.  Eyes:     Conjunctiva/sclera: Conjunctivae normal.  Cardiovascular:     Rate and Rhythm: Regular rhythm. Tachycardia present.     Heart sounds: Normal heart sounds. No murmur heard.   Pulmonary:     Effort: Pulmonary effort is normal. Tachypnea present. No respiratory distress.     Breath sounds: Examination of the right-lower field reveals  wheezing. Examination of the left-lower field reveals wheezing. Wheezing present. No decreased breath sounds, rhonchi or rales.  Chest:     Chest wall: No edema.  Abdominal:     Palpations: Abdomen is soft.     Tenderness: There is no abdominal tenderness.  Musculoskeletal:     Cervical back: Neck supple.     Right lower leg: No edema.     Left lower leg: No edema.  Skin:    General: Skin is warm and dry.  Neurological:     Mental Status: He is alert.     ED Results / Procedures / Treatments   Labs (all labs ordered are listed, but only abnormal results are displayed) Labs Reviewed  BASIC METABOLIC PANEL - Abnormal; Notable for the following components:      Result Value   Sodium 133 (*)    Chloride 95 (*)    Glucose, Bld 702 (*)    BUN 31 (*)    Creatinine, Ser 1.26 (*)    Anion gap 16 (*)    All other components within normal limits  BRAIN NATRIURETIC PEPTIDE - Abnormal; Notable for the following components:   B Natriuretic Peptide 113.2 (*)    All other components within normal limits  CBG MONITORING, ED - Abnormal; Notable for the following components:   Glucose-Capillary >600 (*)    All other components within normal  limits  CBG MONITORING, ED - Abnormal; Notable for the following components:   Glucose-Capillary 576 (*)    All other components within normal limits  CBC  D-DIMER, QUANTITATIVE  BLOOD GAS, VENOUS  URINALYSIS, ROUTINE W REFLEX MICROSCOPIC  TROPONIN I (HIGH SENSITIVITY)  TROPONIN I (HIGH SENSITIVITY)    EKG None  Radiology DG Chest 2 View  Result Date: 09/13/2020 CLINICAL DATA:  Chest pain EXAM: CHEST - 2 VIEW COMPARISON:  09/10/2020, CT 01/22/2020 FINDINGS: Emphysematous disease. No focal opacity or pleural effusion. Stable cardiomediastinal silhouette. No pneumothorax. Calcified granuloma left lung base. IMPRESSION: No acute changes compared to prior.  Emphysematous disease Electronically Signed   By: Donavan Foil M.D.   On: 09/13/2020 20:17    Procedures Procedures   Medications Ordered in ED Medications  ipratropium-albuterol (DUONEB) 0.5-2.5 (3) MG/3ML nebulizer solution 3 mL (3 mLs Nebulization Given 09/13/20 1959)  dexamethasone (DECADRON) injection 10 mg (10 mg Intravenous Given 09/13/20 1957)    ED Course  I have reviewed the triage vital signs and the nursing notes.  Pertinent labs & imaging results that were available during my care of the patient were reviewed by me and considered in my medical decision making (see chart for details).  Clinical Course as of 09/13/20 2251  Nancy Fetter Sep 13, 2020  2231 I spoke with Dr. Hal Hope. [AW]    Clinical Course User Index [AW] Arnaldo Natal, MD   MDM Rules/Calculators/A&P                          The patient is 65 years old.  He has a history of MI, COPD, and diabetes.  He was recently admitted to the ED with chest pain and dyspnea.  His discharge diagnosis was COPD exacerbation, and he was given a course of steroids.  Today he presented with left-sided chest pain as well as dyspnea.  Again, he was worked up for emergent etiologies of chest pain such as ACS, PE, and pneumonia.  He was found to be quite hyperglycemic and was  placed on the  hypoglycemia protocol.  No evidence of ketoacidosis.  Patient will be admitted to the hospital for ongoing correction of his hyperglycemia.  When I communicated this back to the patient, he actually endorsed chest pain described more as a burning sensation.  It seems possible his pain is related to a GI source.  Nonetheless, he will be admitted for further evaluation. Final Clinical Impression(s) / ED Diagnoses Final diagnoses:  COPD exacerbation ([place])  Hyperglycemia  Chest pain, unspecified type    Rx / DC Orders ED Discharge Orders    None       Arnaldo Natal, MD 09/13/20 2253

## 2020-09-14 ENCOUNTER — Observation Stay (HOSPITAL_COMMUNITY): Payer: Medicare HMO

## 2020-09-14 DIAGNOSIS — I10 Essential (primary) hypertension: Secondary | ICD-10-CM | POA: Diagnosis not present

## 2020-09-14 DIAGNOSIS — R072 Precordial pain: Secondary | ICD-10-CM

## 2020-09-14 DIAGNOSIS — E7800 Pure hypercholesterolemia, unspecified: Secondary | ICD-10-CM | POA: Diagnosis not present

## 2020-09-14 DIAGNOSIS — R109 Unspecified abdominal pain: Secondary | ICD-10-CM | POA: Diagnosis not present

## 2020-09-14 DIAGNOSIS — R0789 Other chest pain: Secondary | ICD-10-CM | POA: Diagnosis not present

## 2020-09-14 DIAGNOSIS — Z8719 Personal history of other diseases of the digestive system: Secondary | ICD-10-CM | POA: Diagnosis not present

## 2020-09-14 DIAGNOSIS — K8020 Calculus of gallbladder without cholecystitis without obstruction: Secondary | ICD-10-CM | POA: Diagnosis not present

## 2020-09-14 LAB — CBC
HCT: 44.1 % (ref 39.0–52.0)
Hemoglobin: 14.9 g/dL (ref 13.0–17.0)
MCH: 32 pg (ref 26.0–34.0)
MCHC: 33.8 g/dL (ref 30.0–36.0)
MCV: 94.6 fL (ref 80.0–100.0)
Platelets: 204 10*3/uL (ref 150–400)
RBC: 4.66 MIL/uL (ref 4.22–5.81)
RDW: 12.1 % (ref 11.5–15.5)
WBC: 9.8 10*3/uL (ref 4.0–10.5)
nRBC: 0 % (ref 0.0–0.2)

## 2020-09-14 LAB — BASIC METABOLIC PANEL
Anion gap: 10 (ref 5–15)
Anion gap: 6 (ref 5–15)
Anion gap: 14 (ref 5–15)
Anion gap: 9 (ref 5–15)
BUN: 33 mg/dL — ABNORMAL HIGH (ref 8–23)
BUN: 25 mg/dL — ABNORMAL HIGH (ref 8–23)
BUN: 24 mg/dL — ABNORMAL HIGH (ref 8–23)
BUN: 22 mg/dL (ref 8–23)
CO2: 23 mmol/L (ref 22–32)
CO2: 24 mmol/L (ref 22–32)
CO2: 21 mmol/L — ABNORMAL LOW (ref 22–32)
CO2: 25 mmol/L (ref 22–32)
Calcium: 9.3 mg/dL (ref 8.9–10.3)
Calcium: 8 mg/dL — ABNORMAL LOW (ref 8.9–10.3)
Calcium: 9 mg/dL (ref 8.9–10.3)
Calcium: 9.3 mg/dL (ref 8.9–10.3)
Chloride: 102 mmol/L (ref 98–111)
Chloride: 110 mmol/L (ref 98–111)
Chloride: 103 mmol/L (ref 98–111)
Chloride: 107 mmol/L (ref 98–111)
Creatinine, Ser: 1.17 mg/dL (ref 0.61–1.24)
Creatinine, Ser: 0.8 mg/dL (ref 0.61–1.24)
Creatinine, Ser: 0.8 mg/dL (ref 0.61–1.24)
Creatinine, Ser: 0.65 mg/dL (ref 0.61–1.24)
GFR, Estimated: >60 mL/min (ref >60)
GFR, Estimated: >60 mL/min (ref >60)
GFR, Estimated: >60 mL/min (ref >60)
GFR, Estimated: >60 mL/min (ref >60)
Glucose, Bld: 525 mg/dL (ref 70–99)
Glucose, Bld: 243 mg/dL — ABNORMAL HIGH (ref 70–99)
Glucose, Bld: 250 mg/dL — ABNORMAL HIGH (ref 70–99)
Glucose, Bld: 158 mg/dL — ABNORMAL HIGH (ref 70–99)
Potassium: 3.9 mmol/L (ref 3.5–5.1)
Potassium: 3.2 mmol/L — ABNORMAL LOW (ref 3.5–5.1)
Potassium: 3.5 mmol/L (ref 3.5–5.1)
Potassium: 3.4 mmol/L — ABNORMAL LOW (ref 3.5–5.1)
Sodium: 135 mmol/L (ref 135–145)
Sodium: 140 mmol/L (ref 135–145)
Sodium: 138 mmol/L (ref 135–145)
Sodium: 141 mmol/L (ref 135–145)

## 2020-09-14 LAB — HEPATIC FUNCTION PANEL
ALT: 44 U/L (ref 0–44)
AST: 24 U/L (ref 15–41)
Albumin: 3.9 g/dL (ref 3.5–5.0)
Alkaline Phosphatase: 56 U/L (ref 38–126)
Bilirubin, Direct: <0.1 mg/dL (ref 0.0–0.2)
Total Bilirubin: 0.6 mg/dL (ref 0.3–1.2)
Total Protein: 6.7 g/dL (ref 6.5–8.1)

## 2020-09-14 LAB — GLUCOSE, CAPILLARY
Glucose-Capillary: 124 mg/dL — ABNORMAL HIGH (ref 70–99)
Glucose-Capillary: 139 mg/dL — ABNORMAL HIGH (ref 70–99)
Glucose-Capillary: 141 mg/dL — ABNORMAL HIGH (ref 70–99)
Glucose-Capillary: 147 mg/dL — ABNORMAL HIGH (ref 70–99)
Glucose-Capillary: 176 mg/dL — ABNORMAL HIGH (ref 70–99)
Glucose-Capillary: 259 mg/dL — ABNORMAL HIGH (ref 70–99)
Glucose-Capillary: 304 mg/dL — ABNORMAL HIGH (ref 70–99)

## 2020-09-14 LAB — TROPONIN I (HIGH SENSITIVITY)
Troponin I (High Sensitivity): 12 ng/L (ref <18)
Troponin I (High Sensitivity): 11 ng/L (ref <18)

## 2020-09-14 LAB — CBG MONITORING, ED
Glucose-Capillary: 186 mg/dL — ABNORMAL HIGH (ref 70–99)
Glucose-Capillary: 219 mg/dL — ABNORMAL HIGH (ref 70–99)
Glucose-Capillary: 225 mg/dL — ABNORMAL HIGH (ref 70–99)
Glucose-Capillary: 230 mg/dL — ABNORMAL HIGH (ref 70–99)
Glucose-Capillary: 254 mg/dL — ABNORMAL HIGH (ref 70–99)
Glucose-Capillary: 288 mg/dL — ABNORMAL HIGH (ref 70–99)
Glucose-Capillary: 302 mg/dL — ABNORMAL HIGH (ref 70–99)
Glucose-Capillary: 368 mg/dL — ABNORMAL HIGH (ref 70–99)
Glucose-Capillary: 431 mg/dL — ABNORMAL HIGH (ref 70–99)

## 2020-09-14 LAB — OSMOLALITY: Osmolality: 327 mOsm/kg (ref 275–295)

## 2020-09-14 LAB — MRSA PCR SCREENING: MRSA by PCR: NEGATIVE

## 2020-09-14 LAB — BETA-HYDROXYBUTYRIC ACID: Beta-Hydroxybutyric Acid: 0.09 mmol/L (ref 0.05–0.27)

## 2020-09-14 LAB — SARS CORONAVIRUS 2 (TAT 6-24 HRS): SARS Coronavirus 2: NEGATIVE

## 2020-09-14 LAB — LIPASE, BLOOD: Lipase: 27 U/L (ref 11–51)

## 2020-09-14 MED ORDER — ORAL CARE MOUTH RINSE
15.0000 mL | Freq: Two times a day (BID) | OROMUCOSAL | Status: DC
  Administered 2020-09-14 – 2020-09-15 (×4): 15 mL via OROMUCOSAL

## 2020-09-14 MED ORDER — ALBUTEROL SULFATE HFA 108 (90 BASE) MCG/ACT IN AERS
4.0000 | INHALATION_SPRAY | RESPIRATORY_TRACT | Status: DC
  Administered 2020-09-14 – 2020-09-15 (×2): 4 via RESPIRATORY_TRACT
  Filled 2020-09-14: qty 6.7

## 2020-09-14 MED ORDER — INSULIN GLARGINE 100 UNIT/ML ~~LOC~~ SOLN
10.0000 [IU] | Freq: Every day | SUBCUTANEOUS | Status: DC
  Administered 2020-09-14 – 2020-09-15 (×2): 10 [IU] via SUBCUTANEOUS
  Filled 2020-09-14 (×3): qty 0.1

## 2020-09-14 MED ORDER — TECHNETIUM TC 99M MEBROFENIN IV KIT
5.1000 | PACK | Freq: Once | INTRAVENOUS | Status: AC
  Administered 2020-09-14: 5.1 via INTRAVENOUS

## 2020-09-14 MED ORDER — ALBUTEROL SULFATE HFA 108 (90 BASE) MCG/ACT IN AERS
4.0000 | INHALATION_SPRAY | RESPIRATORY_TRACT | Status: DC
  Administered 2020-09-14: 4 via RESPIRATORY_TRACT
  Filled 2020-09-14: qty 6.7

## 2020-09-14 MED ORDER — METOPROLOL TARTRATE 25 MG PO TABS
100.0000 mg | ORAL_TABLET | Freq: Once | ORAL | Status: AC
  Administered 2020-09-15: 100 mg via ORAL
  Filled 2020-09-14: qty 4

## 2020-09-14 MED ORDER — INSULIN ASPART 100 UNIT/ML ~~LOC~~ SOLN: 0.0000 [IU] | Freq: Three times a day (TID) | SUBCUTANEOUS | Status: DC

## 2020-09-14 MED ORDER — INSULIN STARTER KIT- PEN NEEDLES (SPANISH)
1.0000 | Freq: Once | Status: AC
  Administered 2020-09-15: 1
  Filled 2020-09-14: qty 1

## 2020-09-14 MED ORDER — LIVING WELL WITH DIABETES BOOK - IN SPANISH
Freq: Once | Status: AC
  Filled 2020-09-14: qty 1

## 2020-09-14 MED ORDER — ASPIRIN EC 81 MG PO TBEC
81.0000 mg | DELAYED_RELEASE_TABLET | Freq: Once | ORAL | Status: AC
  Administered 2020-09-14: 81 mg via ORAL
  Filled 2020-09-14: qty 1

## 2020-09-14 MED ORDER — MORPHINE SULFATE (PF) 2 MG/ML IV SOLN
1.0000 mg | Freq: Once | INTRAVENOUS | Status: AC
  Administered 2020-09-14: 1 mg via INTRAVENOUS
  Filled 2020-09-14: qty 1

## 2020-09-14 MED ORDER — PANTOPRAZOLE SODIUM 40 MG IV SOLR
40.0000 mg | INTRAVENOUS | Status: DC
  Administered 2020-09-14 – 2020-09-15 (×2): 40 mg via INTRAVENOUS
  Filled 2020-09-14 (×2): qty 40

## 2020-09-14 MED ORDER — CHLORHEXIDINE GLUCONATE CLOTH 2 % EX PADS
6.0000 | MEDICATED_PAD | Freq: Every day | CUTANEOUS | Status: DC
  Administered 2020-09-14 – 2020-09-16 (×3): 6 via TOPICAL

## 2020-09-14 MED ORDER — MORPHINE SULFATE (PF) 4 MG/ML IV SOLN
3.0000 mg | Freq: Once | INTRAVENOUS | Status: AC
  Administered 2020-09-14: 3 mg via INTRAVENOUS

## 2020-09-14 MED ORDER — INSULIN ASPART 100 UNIT/ML ~~LOC~~ SOLN
3.0000 [IU] | Freq: Once | SUBCUTANEOUS | Status: AC
  Administered 2020-09-14: 3 [IU] via SUBCUTANEOUS

## 2020-09-14 MED ORDER — CHLORHEXIDINE GLUCONATE 0.12 % MT SOLN
15.0000 mL | Freq: Two times a day (BID) | OROMUCOSAL | Status: DC
  Administered 2020-09-14 – 2020-09-16 (×5): 15 mL via OROMUCOSAL
  Filled 2020-09-14 (×4): qty 15

## 2020-09-14 MED ORDER — MORPHINE SULFATE (PF) 2 MG/ML IV SOLN
INTRAVENOUS | Status: AC
  Filled 2020-09-14: qty 2

## 2020-09-14 NOTE — Consult Note (Signed)
Cardiology Consultation:   Patient ID: Duane Cuevas MRN: [id number]; DOB: October 14, 1955  Admit date: 09/13/2020 Date of Consult: 09/14/2020  PCP:  [name], [place]  Cardiologist:  New to [place]; Dr. Radford Pax Advanced Practice Provider:  No care team member to display Electrophysiologist:  None  Patient Profile:   Duane Cuevas is a 65 y.o. male with a PMH of HTN, HLD, DM type 2, COPD, and tobacco abuse who is being seen today for the evaluation of chest pain at the request of Dr. Hal Hope.  History of Present Illness:   Duane Cuevas initially presented to the ED 09/10/20 with complaints of chest pain which started the day before with some associated SOB. Symptoms were attributed to COPD exacerbation following an EKG which was non-ischemic, negative HsTrop trend, negative Ddimer, and benign CXR. He was given steroids and instructed to follow-up with PCP. He returned to the ED 09/13/20 with complaints burning chest discomfort and SOB.   On arrival to the ED he was mildly tachycardic and tachypneic, otherwise VSS. He has had intermittent hypertension. Initial labs notable for glucose 702>250, Cr 1.26>0.8, LFT and lipase wnl, CBC wnl, Ddimer negative, HsTrop negative x4, BNP 113. EKG showed sinus rhythm, rate 97bpm, non-specific ST-T wave abnormalities, no STE/D, no significant change from previous. CXR showed emphysematous disease but no acute findings. He was admitted to medicine for further evaluation. Suspected possible GI etiology of chest pain. Abdominal US showed cholelithiasis without acute cholecystitis. HIDA scan ordered. He was started on an insulin gtt with improvement in glucose Cardiology asked to evaluate chest pain.  He has no prior heart disease history, though appears to have undergone a stress test 11/2019 which showed no ischemia, though a large defect in the basal inferior and mid interior location suggestive of prior  inferior infarct was noted with EF 58%.  Stress test was obtained to evaluate chest pain. He was recommended to continue statin and quit smoking, though does not appear cardiology evaluated that admission, nor was he referred outpatient. His last echocardiogram 04/2020 showed EF 60-65%, mild concentric LVH, G1DD, no RWMA, moderately elevated PA pressures, and no significant valvular abnormalities.   Past Medical History:  Diagnosis Date  . Diabetes mellitus ([place]) 10/2014   pt denies being diabetic.   . Emphysema of lung ([place])   . Emphysema/COPD ([place]) 10/2014  . Hepatic steatosis   . Thrombocytopenia ([place]) 09/2015   platelets in 120s.     Past Surgical History:  Procedure Laterality Date  . ESOPHAGOGASTRODUODENOSCOPY N/A 09/23/2015   Procedure: ESOPHAGOGASTRODUODENOSCOPY (EGD);  Surgeon: Ladene Artist, MD;  Location: [facility];  Service: Endoscopy;  Laterality: N/A;  . FOOT SURGERY    . INGUINAL HERNIA REPAIR Left 05/15/2018   Procedure: LEFT INGUINAL HERNIA REPAIR WITH MESH;  Surgeon: Erroll Luna, MD;  Location: [facility];  Service: General;  Laterality: Left;  . INSERTION OF MESH Left 05/15/2018   Procedure: INSERTION OF MESH;  Surgeon: Erroll Luna, MD;  Location: [facility];  Service: General;  Laterality: Left;  . UPPER GASTROINTESTINAL ENDOSCOPY       Home Medications:  Prior to Admission medications   Medication Sig Start Date End Date Taking? Authorizing Provider  acetaminophen (TYLENOL) 500 MG tablet Take 1,000 mg by mouth every 6 (six) hours as needed for moderate pain or headache.   Yes [provider]  albuterol (PROVENTIL) (2.5 MG/3ML) 0.083% nebulizer solution TAKE 3 ML(2.5 MG TOTAL) BY NEBULIZATION EVERY 6(SIX) HOURS  AS NEEDED Patient taking differently: Take 2.5 mg by nebulization every 6 (six) hours as needed for wheezing or shortness of breath. 08/05/20  Yes Sagardia, Ines Bloomer, MD  amLODipine (NORVASC) 5 MG tablet Take 1 tablet (5 mg total) by mouth daily.  08/21/20  Yes Maximiano Coss, NP  atorvastatin (LIPITOR) 20 MG tablet Take 1 tablet (20 mg total) by mouth daily. 06/22/20  Yes Sagardia, Ines Bloomer, MD  budesonide-formoterol [facility]) 160-4.5 MCG/ACT inhaler Inhale 2 puffs into the lungs 2 (two) times daily. 06/22/20  Yes Sagardia, Ines Bloomer, MD  glipiZIDE (GLUCOTROL) 10 MG tablet Take 1 tablet (10 mg total) by mouth 2 (two) times daily before a meal. 08/21/20  Yes Maximiano Coss, NP  meclizine (ANTIVERT) 25 MG tablet TAKE ONE TABLET BY MOUTH THREE TIMES A DAY AS NEEDED FOR DIZZINESS Patient taking differently: Take 25 mg by mouth daily as needed for dizziness or nausea. 07/31/20  Yes [name], MD  metFORMIN (GLUCOPHAGE) 1000 MG tablet Take 1 tablet (1,000 mg total) by mouth 2 (two) times daily with a meal. 06/22/20 09/20/20 Yes Sagardia, Ines Bloomer, MD  predniSONE (DELTASONE) 20 MG tablet Take 2 tablets (40 mg total) by mouth daily. Patient taking differently: Take 40 mg by mouth daily as needed (pain). 06/26/20  Yes Blanchie Dessert, MD  aspirin EC 81 MG EC tablet Take 1 tablet (81 mg total) by mouth daily. Patient not taking: No sig reported 11/03/19   Annita Brod, MD  predniSONE (DELTASONE) 20 MG tablet Take 2 tablets (40 mg total) by mouth daily for 5 days. Patient not taking: No sig reported 09/10/20 09/15/20  Lucrezia Starch, MD    Inpatient Medications: Scheduled Meds: . albuterol  8 puff Inhalation Q4H  . amLODipine  5 mg Oral Daily  . atorvastatin  20 mg Oral Daily  . chlorhexidine  15 mL Mouth Rinse BID  . Chlorhexidine Gluconate Cloth  6 each Topical Daily  . enoxaparin (LOVENOX) injection  40 mg Subcutaneous Q24H  . mouth rinse  15 mL Mouth Rinse q12n4p  . mometasone-formoterol  2 puff Inhalation BID  . pantoprazole (PROTONIX) IV  40 mg Intravenous Q24H   Continuous Infusions: . dextrose 5% lactated ringers 125 mL/hr at 09/14/20 1218  . insulin 7 mL/hr at 09/14/20 0918  . lactated ringers Stopped  (09/14/20 0422)   PRN Meds: dextrose  Allergies:   No Known Allergies  Social History:   Social History   Socioeconomic History  . Marital status: Married    Spouse name: Ramonita Lab  . Number of children: 6  . Years of education: Not on file  . Highest education level: Not on file  Occupational History  . Occupation: Employed    Employer: [employer]  Tobacco Use  . Smoking status: Current Every Day Smoker    Packs/day: 0.15    Years: 47.00    Pack years: 7.05    Types: Cigarettes  . Smokeless tobacco: Never Used  Vaping Use  . Vaping Use: Former  Substance and Sexual Activity  . Alcohol use: Yes    Comment: occ  . Drug use: No  . Sexual activity: Never  Other Topics Concern  . Not on file  Social History Narrative   Lives with wife.  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   Social Determinants of Health   Financial Resource Strain: Not on file  Food Insecurity: Not on file  Transportation Needs: Not on file  Physical Activity: Not  on file  Stress: Not on file  Social Connections: Not on file  Intimate Partner Violence: Not on file    Family History:    Family History  Problem Relation Age of Onset  . Emphysema Mother   . Diabetes Mother   . Emphysema Father   . Cancer Sister        Stomach cancer  . Diabetes Sister   . Stomach cancer Sister   . Diabetes Brother   . Colon cancer Neg Hx   . Colon polyps Neg Hx   . Esophageal cancer Neg Hx   . Rectal cancer Neg Hx      ROS:  Please see the history of present illness.   All other ROS reviewed and negative.     Physical Exam/Data:   Vitals:   09/14/20 1021 09/14/20 1100 09/14/20 1145 09/14/20 1200  BP: 124/76 131/70  120/67  Pulse: 73 74  77  Resp: 20 (!) 21  18  Temp: 97.9 F (36.6 C)   (!) 97.5 F (36.4 C)  TempSrc: Oral   Oral  SpO2: 97% 96%  96%  Weight:   74.4 kg   Height:   5\' 8"  (1.727 m)     Intake/Output Summary (Last 24 hours) at 09/14/2020 1246 Last data filed at  09/14/2020 0931 Gross per 24 hour  Intake 304.09 ml  Output 725 ml  Net -420.91 ml   Last 3 Weights 09/14/2020 09/10/2020 08/21/2020  Weight (lbs) 164 lb 0.4 oz 168 lb 165 lb  Weight (kg) 74.4 kg 76.204 kg 74.844 kg     Body mass index is 24.94 kg/m.  General:  Well nourished, well developed, in no acute distress HEENT: normal Lymph: no adenopathy Neck: no JVD Endocrine:  No thryomegaly Vascular: No carotid bruits; FA pulses 2+ bilaterally without bruits  Cardiac:  normal S1, S2; RRR; no murmur  Lungs:  clear to auscultation bilaterally, no wheezing, rhonchi or rales  Abd: soft, nontender, no hepatomegaly  Ext: no edema Musculoskeletal:  No deformities, BUE and BLE strength normal and equal Skin: warm and dry  Neuro:  CNs 2-12 intact, no focal abnormalities noted Psych:  Normal affect   EKG:  The EKG was personally reviewed and demonstrates:  sinus rhythm, rate 97bpm, non-specific ST-T wave abnormalities, no STE/D, no significant change from previous. Telemetry:  Telemetry was personally reviewed and demonstrates:  NSR  Relevant CV Studies: Echocardiogram 04/2020: 1. Left ventricular ejection fraction, by estimation, is 60 to 65%. The  left ventricle has normal function. The left ventricle has no regional  wall motion abnormalities. There is mild concentric left ventricular  hypertrophy. Left ventricular diastolic  parameters are consistent with Grade I diastolic dysfunction (impaired  relaxation).  2. Right ventricular systolic function is normal. The right ventricular  size is normal. There is moderately elevated pulmonary artery systolic  pressure. The estimated right ventricular systolic pressure is 50.0 mmHg.  3. The mitral valve is normal in structure. No evidence of mitral valve  regurgitation. No evidence of mitral stenosis.  4. The aortic valve is normal in structure. Aortic valve regurgitation is  not visualized. No aortic stenosis is present.  5. The inferior  vena cava is dilated in size with <50% respiratory  variability, suggesting right atrial pressure of 15 mmHg.   NST 11/2019:   There was no ST segment deviation noted during stress.  No T wave inversion was noted during stress.  Defect 1: There is a large defect of severe severity  present in the basal inferior and mid inferior location.  This is a low risk study.  The left ventricular ejection fraction is normal (55-65%).  Nuclear stress EF: 58%.   Abnormal, low risk stress nuclear study with prior inferior infarct; no ischemia; EF 58 with normal wall motion.   Laboratory Data:  High Sensitivity Troponin:   Recent Labs  Lab 09/10/20 1527 09/13/20 1943 09/13/20 2128 09/14/20 0024 09/14/20 0224  TROPONINIHS 4 12 11 12 11      Chemistry Recent Labs  Lab 09/14/20 0305 09/14/20 0748 09/14/20 1037  NA 140 138 141  K 3.2* 3.5 3.4*  CL 110 103 107  CO2 24 21* 25  GLUCOSE 243* 250* 158*  BUN 25* 24* 22  CREATININE 0.80 0.80 0.65  CALCIUM 8.0* 9.0 9.3  GFRNONAA >60 >60 >60  ANIONGAP 6 14 9     Recent Labs  Lab 09/10/20 1454 09/14/20 0224  PROT 7.7 6.7  ALBUMIN 4.3 3.9  AST 23 24  ALT 39 44  ALKPHOS 70 56  BILITOT 0.4 0.6   Hematology Recent Labs  Lab 09/10/20 1304 09/13/20 1943 09/13/20 2305  WBC 10.0 10.2 9.8  RBC 5.36 4.94 4.66  HGB 16.7 15.7 14.9  HCT 50.6 46.7 44.1  MCV 94.4 94.5 94.6  MCH 31.2 31.8 32.0  MCHC 33.0 33.6 33.8  RDW 12.2 12.1 12.1  PLT 244 232 204   BNP Recent Labs  Lab 09/13/20 1947  BNP 113.2*    DDimer  Recent Labs  Lab 09/10/20 1539 09/13/20 1947  DDIMER 0.29 <0.27     Radiology/Studies:  DG Chest 2 View  Result Date: 09/13/2020 CLINICAL DATA:  Chest pain EXAM: CHEST - 2 VIEW COMPARISON:  09/10/2020, CT 01/22/2020 FINDINGS: Emphysematous disease. No focal opacity or pleural effusion. Stable cardiomediastinal silhouette. No pneumothorax. Calcified granuloma left lung base. IMPRESSION: No acute changes compared to  prior.  Emphysematous disease Electronically Signed   By: Donavan Foil M.D.   On: 09/13/2020 20:17   DG Chest 2 View  Result Date: 09/10/2020 CLINICAL DATA:  Chest pain. EXAM: CHEST - 2 VIEW COMPARISON:  06/26/2020 FINDINGS: Lungs are hyperexpanded. The lungs are clear without focal pneumonia, edema, pneumothorax or pleural effusion. Calcified granuloma noted left base as confirmed on CT of 01/22/2020. The cardiopericardial silhouette is within normal limits for size. The visualized bony structures of the thorax show no acute abnormality. IMPRESSION: No active cardiopulmonary disease. Electronically Signed   By: Misty Stanley M.D.   On: 09/10/2020 13:39   US Abdomen Complete  Result Date: 09/14/2020 CLINICAL DATA:  Abdominal pain for 1 day.  Diabetes. EXAM: ABDOMEN ULTRASOUND COMPLETE COMPARISON:  CT abdomen pelvis 09/23/2015 FINDINGS: Gallbladder: Calcified stones noted within the gallbladder lumen. No gallbladder wall thickening visualized. No pericholecystic fluid. No sonographic Murphy sign noted by sonographer. Common bile duct: Diameter: 8 mm.  Stable upper limits of normal. Liver: No focal lesion identified. Within normal limits in parenchymal echogenicity. Portal vein is patent on color Doppler imaging with normal direction of blood flow towards the liver. IVC: No abnormality visualized. Pancreas: Visualized portion unremarkable. Spleen: Size and appearance within normal limits. Right Kidney: Length: 10.8 cm. Echogenicity within normal limits. No mass or hydronephrosis visualized. Left Kidney: Length: 10.8 cm. Echogenicity within normal limits. No mass or hydronephrosis visualized. Abdominal aorta: No aneurysm visualized. Other findings: None. IMPRESSION: Cholelithiasis with no acute cholecystitis. Electronically Signed   By: Iven Finn M.D.   On: 09/14/2020 02:13     Assessment and  Plan:   1. 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 This is his 2nd evaluation for the same complaint in the past week. On both occasions EKG was non-ischemic and HsTrop negative (x4 this admission). BNP mildly elevated to 110s. CXR without acute findings. Chest pain described as a burning sensation - possible GI etiology. Abdominal US with cholelithiasis but no cholecystitis. HIDA scan negative. He had a NST 11/2019 to evaluate chest pain which suggested prior inferior infarct, though no mention of significant coronary artery calcifications on prior CT scans. - his pain comes on with eating and sometimes with exertional and resolves with rest and radiates into his left shoulder and arm and associated with SOB but no diaphoresis but atypical in that it is burning in nature - will proceed with coronary CTA tomorrow - Continue aspirin and statin - Check FLP >>LDL was 102 in Dec - NPO after MN  2. HTN:  -BP intermittently elevated this admission but then some readings that are low>>unclear if readings are all accurate; goal <130/80 -Continue amlodipine -follow and adjust BP meds as needed  3. HLD:  - LDL 102 06/2020; goal <70 given suggestion of prior inferior infarct - Will repeat FLP  - Continue atorvastatin - low threshold to uptitrate if LDL not at goal.   4. DM type 2: glucose significantly elevated to 700s on admission, likely due to recent steroid use. He was started on an insulin gtt with improvement in glucose to 200s. A1C poorly controlled at 10.5 08/21/20. - Continue management per primary team  5. COPD: seen in the ED 09/10/20 and started on steroids for possible COPD exacerbation. CXR without acute findings this admission.  - Continue management per primary team  Risk Assessment/Risk Scores:     :[id number]}   HEAR Score (for undifferentiated chest pain):  HEAR Score: 5    For questions or updates, please contact [facility] Please consult [email or web address] for contact info under    Signed, Fransico Him, MD   09/14/2020 12:46 PM

## 2020-09-14 NOTE — ED Notes (Signed)
Danielle RN aware of Critical DJTT 017, in process of paging admitting to update.

## 2020-09-14 NOTE — ED Notes (Addendum)
Per NM, patient must stay NPO and no pain meds for 6 hours.

## 2020-09-14 NOTE — ED Notes (Signed)
MD made aware patient cannot go to progressive unit on insulin drip.  Bed request changed by MD to stepdown.

## 2020-09-14 NOTE — ED Notes (Signed)
D[place],MD made aware that pts anion gap is now closed, asked about starting long acting insulin and transitioning off of insulin drip. MD wants to continue for 4 more hours.

## 2020-09-14 NOTE — ED Notes (Signed)
MD made aware of pt needing a bed change due to insulin drip.  Informed new orders would be placed for a bed on 4E.

## 2020-09-14 NOTE — Progress Notes (Signed)
PROGRESS NOTE  Duane Cuevas  DOB: [birth year]  PCP: [name], MD XNT:[id number]  DOA: 09/13/2020  LOS: 0 days   Chief Complaint  Patient presents with  . Chest Pain  . Shortness of Breath   Brief narrative: Duane Cuevas is a 65 y.o. male with PMH significant for DM2, HTN, COPD, current everyday smoker. Patient presented to the ED on 3/13 with complaint of chest pain for 5 days 3/10, patient had been to the ED with shortness of breath and chest pain and was discharged home with a few days of prednisone 20 mg daily.  He felt fine at first but chest pain again recurred in 24 hours accompanied by epigastric pain, some nausea and vomiting and hence he returned back to ED. In the ED, patient was afebrile, heart rate was 115, blood pressure stable.  Breathing on room air. Blood work with blood sugar level 702, creatinine 1.26, serum osmolality elevated to 327, CBC unremarkable, troponin negative, D-dimer normal Chest x-ray unremarkable with some chronic emphysematous changes EKG with normal sinus rhythm. Patient was admitted to hospital medicine service  Subjective: Patient was seen and examined this morning.  Pleasant middle-aged Hispanic male.  Good English fluency.  Daughter on the phone. At the time of my evaluation, patient was complaining of pain on the left precordium, tender to touch Chart reviewed.  Assessment/Plan: Chest pain Right upper quadrant/epigastric tenderness -Presented with chest pain, nausea, vomiting.  On examination patient has precordial tenderness.   Troponin normal x3.  EKG without significant ST-T wave changes. -Per admission H&P, stress test last year was normal. -Pending cardiology eval -Also need to consider noncardiac etiology.  Lipase level normal.  LFT in normal range.   -Need to rule out acute cholecystitis.  HIDA scan pending. -Could be acid peptic disease due to chronic smoking and recent use of steroids. -Start on empiric  PPI.  Hyperosmolar hyperglycemic nonketotic state -Initial blood glucose level over 700, anion gap 30 elevated to 16, pH normal, serum osmolality elevated to 327 -Started on IV fluid, infusion drip and serial metabolic panel. -Continue IV fluid and insulin infusion at this time. -Once HIDA scan is done, will switch patient to subcutaneous insulin  Uncontrolled type 2 diabetes mellitus -A1c 10.5 on 08/21/2020 -Home meds include glipizide 10 mg daily, Metformin 1000 mg twice daily -Currently on insulin infusion.  May benefit from long-term insulin. Recent Labs  Lab 09/14/20 0913 09/14/20 1015 09/14/20 1115 09/14/20 1215 09/14/20 1315  GLUCAP 186* 176* 141* 139* 147*   Essential hypertension  -On amlodipine.    COPD Current everyday smoker -COPD stable.  Continue bronchodilators.  Counseled to quit smoking.   Hyperlipidemia  -on statins.  Mobility: Encourage ambulation Code Status:   Code Status: Full Code  Nutritional status: Body mass index is 24.94 kg/m.     Diet Order            Diet NPO time specified  Diet effective now               Start diet after HIDA scan DVT prophylaxis: enoxaparin (LOVENOX) injection 40 mg Start: 09/14/20 1000   Antimicrobials:  None Fluid: D5 LR at 125 mill per hour Consultants: Cardiology Family Communication:  Daughter on the phone   Status is: Observation  The patient will require care spanning > 2 midnights and should be moved to inpatient because: Patient needs further stay because IV fluid requirement, insulin titration, pending further cardiac study  Dispo: The patient is from: Home  Anticipated d/c is to: Home              Patient currently is not medically stable to d/c.   Difficult to place patient No       Infusions:  . dextrose 5% lactated ringers 125 mL/hr at 09/14/20 1218  . insulin 7 mL/hr at 09/14/20 0918  . lactated ringers Stopped (09/14/20 0422)    Scheduled Meds: . albuterol  8 puff  Inhalation Q4H  . amLODipine  5 mg Oral Daily  . atorvastatin  20 mg Oral Daily  . chlorhexidine  15 mL Mouth Rinse BID  . Chlorhexidine Gluconate Cloth  6 each Topical Daily  . enoxaparin (LOVENOX) injection  40 mg Subcutaneous Q24H  . mouth rinse  15 mL Mouth Rinse q12n4p  . mometasone-formoterol  2 puff Inhalation BID  . pantoprazole (PROTONIX) IV  40 mg Intravenous Q24H    Antimicrobials: Anti-infectives (From admission, onward)   None      PRN meds: dextrose   Objective: Vitals:   09/14/20 1200 09/14/20 1300  BP: 120/67 131/72  Pulse: 77 70  Resp: 18 20  Temp: (!) 97.5 F (36.4 C)   SpO2: 96% 96%    Intake/Output Summary (Last 24 hours) at 09/14/2020 1347 Last data filed at 09/14/2020 0931 Gross per 24 hour  Intake 304.09 ml  Output 725 ml  Net -420.91 ml   Filed Weights   09/14/20 1145  Weight: 74.4 kg   Weight change:  Body mass index is 24.94 kg/m.   Physical Exam: General exam: Pleasant Hispanic male Skin: No rashes, lesions or ulcers. HEENT: Atraumatic, normocephalic, no obvious bleeding Lungs: Clear to auscultation bilaterally CVS: Regular rate and rhythm, no murmur.  Precordial tenderness present.  GI/Abd soft, nontender, nondistended, bowel sound present CNS: Alert, awake, oriented x3 Psychiatry: Mood appropriate Extremities: No pedal edema, no calf tenderness  Data Review: I have personally reviewed the laboratory data and studies available.  Recent Labs  Lab 09/10/20 1304 09/13/20 1943 09/13/20 2305  WBC 10.0 10.2 9.8  HGB 16.7 15.7 14.9  HCT 50.6 46.7 44.1  MCV 94.4 94.5 94.6  PLT 244 232 204   Recent Labs  Lab 09/13/20 1943 09/13/20 2305 09/14/20 0305 09/14/20 0748 09/14/20 1037  NA 133* 135 140 138 141  K 4.7 3.9 3.2* 3.5 3.4*  CL 95* 102 110 103 107  CO2 22 23 24  21* 25  GLUCOSE 702* 525* 243* 250* 158*  BUN 31* 33* 25* 24* 22  CREATININE 1.26* 1.17 0.80 0.80 0.65  CALCIUM 9.5 9.3 8.0* 9.0 9.3    F/u labs  ordered Unresulted Labs (From admission, onward)          Start     Ordered   09/20/20 0500  Creatinine, serum  (enoxaparin (LOVENOX)    CrCl >/= 30 ml/min)  Weekly,   R     Comments: while on enoxaparin therapy    09/13/20 2306   09/15/20 0500  CBC with Differential/Platelet  Daily,   R     Question:  Specimen collection method  Answer:  Lab=Lab collect   09/14/20 1346   09/15/20 4315  Basic metabolic panel  Daily,   R     Question:  Specimen collection method  Answer:  Lab=Lab collect   09/14/20 1346   09/15/20 0500  Magnesium  Tomorrow morning,   STAT       Question:  Specimen collection method  Answer:  Lab=Lab collect   09/14/20 1346  09/15/20 0500  Phosphorus  Tomorrow morning,   R       Question:  Specimen collection method  Answer:  Lab=Lab collect   09/14/20 1346          Signed, Terrilee Croak, MD [facility] 09/14/2020

## 2020-09-14 NOTE — Progress Notes (Addendum)
Inpatient Diabetes Program Recommendations  AACE/ADA: New Consensus Statement on Inpatient Glycemic Control (2015)  Target Ranges:  Prepandial:   less than 140 mg/dL      Peak postprandial:   less than 180 mg/dL (1-2 hours)      Critically ill patients:  140 - 180 mg/dL   Lab Results  Component Value Date   GLUCAP 304 (H) 09/14/2020   HGBA1C 10.5 (A) 08/21/2020    Review of Glycemic Control  Diabetes history: DM2 Outpatient Diabetes medications: metformin 1000 mg BID, glipizide 10 mg BID Current orders for Inpatient glycemic control: IV insulin transitioning to Lantus 10 units QD  HgbA1C - 10.5% Recently has been prescribed 20 mg BID Presented to ED 3 days ago for CP, COPD exacerbation  Inpatient Diabetes Program Recommendations:     Add Novolog 0-9 units Q4H.  Follow while inpatient.  Thank you. Lorenda Peck, RD, LDN, CDE Inpatient Diabetes Coordinator [phone]  Addendum:  Ordered Living Well with Diabetes book in spanish and Insulin pen starter kit in spanish. TOC consult for Lantus solostar pen and Novolog flexpen copays

## 2020-09-14 NOTE — ED Notes (Addendum)
Contacted ICU relating to purple man information and they stated they were contacting MD because pt should be transitioned off insulin at this point. This RN has already spoken to the MD about insulin drip earlier and he wanted to continue drip for another 3 hours.

## 2020-09-14 NOTE — ED Notes (Signed)
When this nurse came in for CBG check at 06:49, IV tubing was found to be leaking at the connection site. Pt's CBG was higher than previous hour's level. Insulin level was adjusted accordingly through Endotool. Defective tubing was replaced and hospitalist notified.

## 2020-09-15 ENCOUNTER — Observation Stay (HOSPITAL_COMMUNITY): Payer: Medicare HMO

## 2020-09-15 DIAGNOSIS — K8020 Calculus of gallbladder without cholecystitis without obstruction: Secondary | ICD-10-CM | POA: Diagnosis present

## 2020-09-15 DIAGNOSIS — R1319 Other dysphagia: Secondary | ICD-10-CM

## 2020-09-15 DIAGNOSIS — R739 Hyperglycemia, unspecified: Secondary | ICD-10-CM | POA: Diagnosis present

## 2020-09-15 DIAGNOSIS — K3189 Other diseases of stomach and duodenum: Secondary | ICD-10-CM | POA: Diagnosis not present

## 2020-09-15 DIAGNOSIS — I10 Essential (primary) hypertension: Secondary | ICD-10-CM | POA: Diagnosis present

## 2020-09-15 DIAGNOSIS — I252 Old myocardial infarction: Secondary | ICD-10-CM | POA: Diagnosis not present

## 2020-09-15 DIAGNOSIS — Z7982 Long term (current) use of aspirin: Secondary | ICD-10-CM | POA: Diagnosis not present

## 2020-09-15 DIAGNOSIS — R072 Precordial pain: Secondary | ICD-10-CM | POA: Diagnosis present

## 2020-09-15 DIAGNOSIS — Z79899 Other long term (current) drug therapy: Secondary | ICD-10-CM | POA: Diagnosis not present

## 2020-09-15 DIAGNOSIS — Z20822 Contact with and (suspected) exposure to covid-19: Secondary | ICD-10-CM | POA: Diagnosis not present

## 2020-09-15 DIAGNOSIS — R079 Chest pain, unspecified: Secondary | ICD-10-CM | POA: Diagnosis not present

## 2020-09-15 DIAGNOSIS — Z833 Family history of diabetes mellitus: Secondary | ICD-10-CM | POA: Diagnosis not present

## 2020-09-15 DIAGNOSIS — K319 Disease of stomach and duodenum, unspecified: Secondary | ICD-10-CM | POA: Diagnosis present

## 2020-09-15 DIAGNOSIS — R1310 Dysphagia, unspecified: Secondary | ICD-10-CM | POA: Diagnosis not present

## 2020-09-15 DIAGNOSIS — I700 Atherosclerosis of aorta: Secondary | ICD-10-CM

## 2020-09-15 DIAGNOSIS — Z7951 Long term (current) use of inhaled steroids: Secondary | ICD-10-CM | POA: Diagnosis not present

## 2020-09-15 DIAGNOSIS — B3781 Candidal esophagitis: Secondary | ICD-10-CM | POA: Diagnosis not present

## 2020-09-15 DIAGNOSIS — R0789 Other chest pain: Secondary | ICD-10-CM | POA: Diagnosis not present

## 2020-09-15 DIAGNOSIS — I2510 Atherosclerotic heart disease of native coronary artery without angina pectoris: Secondary | ICD-10-CM | POA: Diagnosis not present

## 2020-09-15 DIAGNOSIS — E1165 Type 2 diabetes mellitus with hyperglycemia: Secondary | ICD-10-CM | POA: Diagnosis not present

## 2020-09-15 DIAGNOSIS — F17210 Nicotine dependence, cigarettes, uncomplicated: Secondary | ICD-10-CM | POA: Diagnosis present

## 2020-09-15 DIAGNOSIS — Z7952 Long term (current) use of systemic steroids: Secondary | ICD-10-CM | POA: Diagnosis not present

## 2020-09-15 DIAGNOSIS — Z825 Family history of asthma and other chronic lower respiratory diseases: Secondary | ICD-10-CM | POA: Diagnosis not present

## 2020-09-15 DIAGNOSIS — K259 Gastric ulcer, unspecified as acute or chronic, without hemorrhage or perforation: Secondary | ICD-10-CM | POA: Diagnosis not present

## 2020-09-15 DIAGNOSIS — Z800 Family history of malignant neoplasm of digestive organs: Secondary | ICD-10-CM | POA: Diagnosis not present

## 2020-09-15 DIAGNOSIS — J439 Emphysema, unspecified: Secondary | ICD-10-CM | POA: Diagnosis present

## 2020-09-15 DIAGNOSIS — T380X5A Adverse effect of glucocorticoids and synthetic analogues, initial encounter: Secondary | ICD-10-CM | POA: Diagnosis not present

## 2020-09-15 DIAGNOSIS — Z7984 Long term (current) use of oral hypoglycemic drugs: Secondary | ICD-10-CM | POA: Diagnosis not present

## 2020-09-15 DIAGNOSIS — E785 Hyperlipidemia, unspecified: Secondary | ICD-10-CM | POA: Diagnosis present

## 2020-09-15 LAB — BASIC METABOLIC PANEL
Anion gap: 9 (ref 5–15)
BUN: 19 mg/dL (ref 8–23)
CO2: 26 mmol/L (ref 22–32)
Calcium: 8.6 mg/dL — ABNORMAL LOW (ref 8.9–10.3)
Chloride: 101 mmol/L (ref 98–111)
Creatinine, Ser: 0.8 mg/dL (ref 0.61–1.24)
GFR, Estimated: >60 mL/min (ref >60)
Glucose, Bld: 230 mg/dL — ABNORMAL HIGH (ref 70–99)
Potassium: 3.8 mmol/L (ref 3.5–5.1)
Sodium: 136 mmol/L (ref 135–145)

## 2020-09-15 LAB — CBC WITH DIFFERENTIAL/PLATELET
Abs Immature Granulocytes: 0.1 10*3/uL — ABNORMAL HIGH (ref 0.00–0.07)
Basophils Absolute: 0 10*3/uL (ref 0.0–0.1)
Basophils Relative: 0 %
Eosinophils Absolute: 0 10*3/uL (ref 0.0–0.5)
Eosinophils Relative: 0 %
HCT: 41.7 % (ref 39.0–52.0)
Hemoglobin: 14.2 g/dL (ref 13.0–17.0)
Immature Granulocytes: 1 %
Lymphocytes Relative: 13 %
Lymphs Abs: 1.6 10*3/uL (ref 0.7–4.0)
MCH: 31.6 pg (ref 26.0–34.0)
MCHC: 34.1 g/dL (ref 30.0–36.0)
MCV: 92.9 fL (ref 80.0–100.0)
Monocytes Absolute: 1 10*3/uL (ref 0.1–1.0)
Monocytes Relative: 8 %
Neutro Abs: 9.7 10*3/uL — ABNORMAL HIGH (ref 1.7–7.7)
Neutrophils Relative %: 78 %
Platelets: 191 10*3/uL (ref 150–400)
RBC: 4.49 MIL/uL (ref 4.22–5.81)
RDW: 12.1 % (ref 11.5–15.5)
WBC: 12.3 10*3/uL — ABNORMAL HIGH (ref 4.0–10.5)
nRBC: 0 % (ref 0.0–0.2)

## 2020-09-15 LAB — GLUCOSE, CAPILLARY
Glucose-Capillary: 134 mg/dL — ABNORMAL HIGH (ref 70–99)
Glucose-Capillary: 204 mg/dL — ABNORMAL HIGH (ref 70–99)
Glucose-Capillary: 305 mg/dL — ABNORMAL HIGH (ref 70–99)
Glucose-Capillary: 157 mg/dL — ABNORMAL HIGH (ref 70–99)
Glucose-Capillary: 169 mg/dL — ABNORMAL HIGH (ref 70–99)

## 2020-09-15 LAB — PHOSPHORUS: Phosphorus: 2.8 mg/dL (ref 2.5–4.6)

## 2020-09-15 LAB — MAGNESIUM: Magnesium: 2 mg/dL (ref 1.7–2.4)

## 2020-09-15 MED ORDER — INSULIN ASPART 100 UNIT/ML ~~LOC~~ SOLN
0.0000 [IU] | Freq: Three times a day (TID) | SUBCUTANEOUS | Status: DC
  Administered 2020-09-15: 7 [IU] via SUBCUTANEOUS
  Administered 2020-09-15: 2 [IU] via SUBCUTANEOUS

## 2020-09-15 MED ORDER — ALUM & MAG HYDROXIDE-SIMETH 200-200-20 MG/5ML PO SUSP: 15.0000 mL | Freq: Four times a day (QID) | ORAL | Status: DC | PRN

## 2020-09-15 MED ORDER — INSULIN ASPART 100 UNIT/ML ~~LOC~~ SOLN: 0.0000 [IU] | Freq: Every day | SUBCUTANEOUS | Status: DC

## 2020-09-15 MED ORDER — ALBUTEROL SULFATE HFA 108 (90 BASE) MCG/ACT IN AERS
4.0000 | INHALATION_SPRAY | Freq: Two times a day (BID) | RESPIRATORY_TRACT | Status: DC
  Administered 2020-09-15 – 2020-09-16 (×2): 4 via RESPIRATORY_TRACT

## 2020-09-15 MED ORDER — SODIUM CHLORIDE 0.9 % IV SOLN: INTRAVENOUS | Status: DC

## 2020-09-15 MED ORDER — GLIPIZIDE 10 MG PO TABS
10.0000 mg | ORAL_TABLET | Freq: Two times a day (BID) | ORAL | Status: DC
  Administered 2020-09-15: 10 mg via ORAL
  Filled 2020-09-15 (×3): qty 1

## 2020-09-15 MED ORDER — IOHEXOL 350 MG/ML SOLN
80.0000 mL | Freq: Once | INTRAVENOUS | Status: AC | PRN
  Administered 2020-09-15: 80 mL via INTRAVENOUS

## 2020-09-15 MED ORDER — PANTOPRAZOLE SODIUM 40 MG IV SOLR
40.0000 mg | Freq: Two times a day (BID) | INTRAVENOUS | Status: DC
  Administered 2020-09-15 – 2020-09-16 (×2): 40 mg via INTRAVENOUS
  Filled 2020-09-15 (×2): qty 40

## 2020-09-15 NOTE — TOC Initial Note (Signed)
Transition of Care [facility]) - Initial/Assessment Note    Patient Details  Name: Duane Cuevas MRN: [id number] Date of Birth: 1956-01-28  [facility] CM/SW Contact:    Leeroy Cha, RN Phone Number: 09/15/2020, 7:52 AM  Clinical Narrative:                 65 y.o. male with history of COPD, ongoing tobacco abuse, diabetes mellitus type 2, hypertension presents to the ER for the second time in the last 5 days because of chest pain.  When he came around 5 days ago patient had chest pain and shortness of breath was treated for COPD and discharged home.  Patient states he did well after that but the chest pain started coming again over the last 24 hours mostly in the center of the chest and epigastric area.  Had some nausea and vomiting.  Denies any diarrhea fever chills productive cough.  Chest pain is mostly constant sometimes burning sensations intense pressure-like.  ED Course: In the ER chest x-ray was unremarkable showing emphysematous changes EKG showing normal sinus rhythm but I sensitive troponin was negative.  Labs are significant for blood glucose of 702 with anion gap of 16 creatinine 1.2 which increased from 1.9 few days ago.  Patient was started on insulin infusion and admitted for further management of chest pain and uncontrolled diabetes.  Uncontrolled diabetes likely from recent use of prednisone.  On exam patient does have epigastric and right upper quadrant tenderness. PLAN: has been seen by the diabetic coordinator, home when stable Expected Discharge Plan: Home/Self Care Barriers to Discharge: Continued Medical Work up   Patient Goals and CMS Choice Patient states their goals for this hospitalization and ongoing recovery are:: to go home CMS Medicare.gov Compare Post Acute Care list provided to:: Patient    Expected Discharge Plan and Services Expected Discharge Plan: Home/Self Care   Discharge Planning Services: CM Consult   Living arrangements for  the past 2 months: Single Family Home                                      Prior Living Arrangements/Services Living arrangements for the past 2 months: Single Family Home Lives with:: Spouse Patient language and need for interpreter reviewed:: Yes Do you feel safe going back to the place where you live?: Yes      Need for Family Participation in Patient Care: Yes (Comment) Care giver support system in place?: Yes (comment)   Criminal Activity/Legal Involvement Pertinent to Current Situation/Hospitalization: No - Comment as needed  Activities of Daily Living Home Assistive Devices/Equipment: Nebulizer ADL Screening (condition at time of admission) Patient's cognitive ability adequate to safely complete daily activities?: Yes Is the patient deaf or have difficulty hearing?: No Does the patient have difficulty seeing, even when wearing glasses/contacts?: No Does the patient have difficulty concentrating, remembering, or making decisions?: No Patient able to express need for assistance with ADLs?: Yes Does the patient have difficulty dressing or bathing?: No Independently performs ADLs?: Yes (appropriate for developmental age) Does the patient have difficulty walking or climbing stairs?: No Weakness of Legs: None Weakness of Arms/Hands: None  Permission Sought/Granted                  Emotional Assessment Appearance:: Appears stated age Attitude/Demeanor/Rapport: Engaged Affect (typically observed): Calm Orientation: : Oriented to Place,Oriented to Self,Oriented to  Time,Oriented to Situation Alcohol /  Substance Use: Not Applicable Psych Involvement: No (comment)  Admission diagnosis:  Hyperglycemia [R73.9] COPD exacerbation (HCC) [J44.1] Abdominal pain [R10.9] Chest pain, unspecified type [R07.9] Patient Active Problem List   Diagnosis Date Noted  . Hyperglycemia 09/13/2020  . Uncontrolled type 2 diabetes mellitus with hyperglycemia ([place]) 09/13/2020  .  Musculoskeletal chest pain 04/29/2020  . COPD exacerbation ([place]) 04/28/2020  . Acute exacerbation of chronic obstructive pulmonary disease (COPD) ([place]) 04/27/2020  . CAD (coronary artery disease) 01/23/2020  . Hyperlipidemia 11/02/2019  . History of MI (myocardial infarction) 11/02/2019  . Chest pain 11/01/2019  . Cigarette smoker 08/15/2018  . COPD ? GOLD III/ active smoker 08/14/2018  . History of diet-controlled diabetes 06/30/2018  . COPD (chronic obstructive pulmonary disease) ([place]) 10/14/2014  . Type 2 diabetes mellitus with hyperglycemia, without long-term current use of insulin ([place]) 10/14/2014   PCP:  [name], MD Pharmacy:   [facility] [address] [place], [place]. AT [address]. [place] [address] Phone: [phone] Fax: [phone]     Social Determinants of Health (SDOH) Interventions    Readmission Risk Interventions No flowsheet data found.

## 2020-09-15 NOTE — Consult Note (Signed)
Consultation  Referring Provider: Dr. Pietro Cassis     Primary Care Physician:  [name], MD Primary Gastroenterologist: Dr. Havery Moros       Reason for Consultation: Epigastric pain             HPI:   Duane Cuevas is a 65 y.o. Hispanic male with a past medical history of COPD, ongoing tobacco abuse, diabetes, hypertension and others listed below, who presented to the hospital on 09/13/2020 with chest pain.  We are consulted in regards to some epigastric pain/dysphagia.    Today, the patient explains that over the past month and half he has had a couple of episodes of what he describes as chest pain but tells me it feels like "fire/burning", describes as radiating up his chest and over to the left side, this is definitely worse after he eats any food and feels to come on at the same time as reflux symptoms.  Also describes episodes of dysphagia where food feels like it is getting stuck, he just has to drink a few sips of water for to go down, and this has occurred 3 or 4 times over the past month and a half.  He describes a history of being on some medicine for his reflux but is not sure what this is.  Associated symptoms included tightness and nausea.  No vomiting.    Denies fever, chills, change in bowel habits or blood in his stool.  ED course: Chest x-ray showing emphysematous changes, EKG showing normal sinus rhythm but sensitive troponin was negative, lab significant for blood glucose of 702, anion gap 16, creatinine 1.2  GI history: 05/10/2018 colonoscopy Dr. Havery Moros: Diverticulosis in the sigmoid and ascending colon, 1 3 mm polyp in the cecum, 1 4 mm polyp at the rectosigmoid colon, tortuous colon and otherwise normal ; pathology showed adenomatous polyps, repeat recommended in 5 years 09/23/2015 EGD Dr. Fuller Plan: Nonbleeding diffuse erosive gastropathy, monilial esophagitis, normal duodenum; started on Diflucan  Past Medical History:  Diagnosis Date  . Diabetes mellitus  ([place]) 10/2014   pt denies being diabetic.   . Emphysema of lung ([place])   . Emphysema/COPD ([place]) 10/2014  . Hepatic steatosis   . Thrombocytopenia ([place]) 09/2015   platelets in 120s.     Past Surgical History:  Procedure Laterality Date  . ESOPHAGOGASTRODUODENOSCOPY N/A 09/23/2015   Procedure: ESOPHAGOGASTRODUODENOSCOPY (EGD);  Surgeon: Ladene Artist, MD;  Location: [facility];  Service: Endoscopy;  Laterality: N/A;  . FOOT SURGERY    . INGUINAL HERNIA REPAIR Left 05/15/2018   Procedure: LEFT INGUINAL HERNIA REPAIR WITH MESH;  Surgeon: Erroll [place], MD;  Location: [facility];  Service: General;  Laterality: Left;  . INSERTION OF MESH Left 05/15/2018   Procedure: INSERTION OF MESH;  Surgeon: Erroll [place], MD;  Location: [facility];  Service: General;  Laterality: Left;  . UPPER GASTROINTESTINAL ENDOSCOPY      Family History  Problem Relation Age of Onset  . Emphysema Mother   . Diabetes Mother   . Emphysema Father   . Cancer Sister        Stomach cancer  . Diabetes Sister   . Stomach cancer Sister   . Diabetes Brother   . Colon cancer Neg Hx   . Colon polyps Neg Hx   . Esophageal cancer Neg Hx   . Rectal cancer Neg Hx     Social History   Tobacco Use  . Smoking status: Current Every Day Smoker  Packs/day: 0.15    Years: 47.00    Pack years: 7.05    Types: Cigarettes  . Smokeless tobacco: Never Used  Vaping Use  . Vaping Use: Former  Substance Use Topics  . Alcohol use: Yes    Comment: occ  . Drug use: No    Prior to Admission medications   Medication Sig Start Date End Date Taking? Authorizing Provider  acetaminophen (TYLENOL) 500 MG tablet Take 1,000 mg by mouth every 6 (six) hours as needed for moderate pain or headache.   Yes [provider]  albuterol (PROVENTIL) (2.5 MG/3ML) 0.083% nebulizer solution TAKE 3 ML(2.5 MG TOTAL) BY NEBULIZATION EVERY 6(SIX) HOURS AS NEEDED Patient taking differently: Take 2.5 mg by nebulization every 6 (six) hours as  needed for wheezing or shortness of breath. 08/05/20  Yes Sagardia, Ines Bloomer, MD  amLODipine (NORVASC) 5 MG tablet Take 1 tablet (5 mg total) by mouth daily. 08/21/20  Yes Maximiano Coss, NP  atorvastatin (LIPITOR) 20 MG tablet Take 1 tablet (20 mg total) by mouth daily. 06/22/20  Yes Sagardia, Ines Bloomer, MD  budesonide-formoterol [facility]) 160-4.5 MCG/ACT inhaler Inhale 2 puffs into the lungs 2 (two) times daily. 06/22/20  Yes Sagardia, Ines Bloomer, MD  glipiZIDE (GLUCOTROL) 10 MG tablet Take 1 tablet (10 mg total) by mouth 2 (two) times daily before a meal. 08/21/20  Yes Maximiano Coss, NP  meclizine (ANTIVERT) 25 MG tablet TAKE ONE TABLET BY MOUTH THREE TIMES A DAY AS NEEDED FOR DIZZINESS Patient taking differently: Take 25 mg by mouth daily as needed for dizziness or nausea. 07/31/20  Yes [name], MD  metFORMIN (GLUCOPHAGE) 1000 MG tablet Take 1 tablet (1,000 mg total) by mouth 2 (two) times daily with a meal. 06/22/20 09/20/20 Yes Sagardia, Ines Bloomer, MD  predniSONE (DELTASONE) 20 MG tablet Take 2 tablets (40 mg total) by mouth daily. Patient taking differently: Take 40 mg by mouth daily as needed (pain). 06/26/20  Yes Blanchie Dessert, MD  aspirin EC 81 MG EC tablet Take 1 tablet (81 mg total) by mouth daily. Patient not taking: No sig reported 11/03/19   Annita Brod, MD  predniSONE (DELTASONE) 20 MG tablet Take 2 tablets (40 mg total) by mouth daily for 5 days. Patient not taking: No sig reported 09/10/20 09/15/20  Lucrezia Starch, MD    Current Facility-Administered Medications  Medication Dose Route Frequency Provider Last Rate Last Admin  . albuterol (VENTOLIN HFA) 108 (90 Base) MCG/ACT inhaler 4 puff  4 puff Inhalation BID Dahal, Binaya, MD      . alum & mag hydroxide-simeth (MAALOX/MYLANTA) 200-200-20 MG/5ML suspension 15 mL  15 mL Oral Q6H PRN Dahal, Binaya, MD      . amLODipine (NORVASC) tablet 5 mg  5 mg Oral Daily Rise Patience, MD   5 mg at  09/14/20 0835  . atorvastatin (LIPITOR) tablet 20 mg  20 mg Oral Daily Rise Patience, MD   20 mg at 09/14/20 0836  . chlorhexidine (PERIDEX) 0.12 % solution 15 mL  15 mL Mouth Rinse BID Terrilee Croak, MD   15 mL at 09/14/20 2021  . Chlorhexidine Gluconate Cloth 2 % PADS 6 each  6 each Topical Daily Dahal, Marlowe Aschoff, MD   6 each at 09/14/20 1022  . dextrose 5 % in lactated ringers infusion   Intravenous Continuous Rise Patience, MD 125 mL/hr at 09/14/20 1645 Infusion Verify at 09/14/20 1645  . dextrose 50 % solution 0-50 mL  0-50 mL  Intravenous PRN Rise Patience, MD      . enoxaparin (LOVENOX) injection 40 mg  40 mg Subcutaneous Q24H Rise Patience, MD   40 mg at 09/14/20 0841  . insulin aspart (novoLOG) injection 0-5 Units  0-5 Units Subcutaneous QHS Dahal, Binaya, MD      . insulin aspart (novoLOG) injection 0-9 Units  0-9 Units Subcutaneous TID WC Dahal, Binaya, MD      . insulin glargine (LANTUS) injection 10 Units  10 Units Subcutaneous Daily Dahal, Marlowe Aschoff, MD   10 Units at 09/14/20 1539  . lactated ringers infusion   Intravenous Continuous Rise Patience, MD   Stopped at 09/14/20 0422  . MEDLINE mouth rinse  15 mL Mouth Rinse q12n4p Dahal, Binaya, MD   15 mL at 09/14/20 1623  . mometasone-formoterol (DULERA) 200-5 MCG/ACT inhaler 2 puff  2 puff Inhalation BID Rise Patience, MD   2 puff at 09/15/20 [phone]  . pantoprazole (PROTONIX) injection 40 mg  40 mg Intravenous Q24H Terrilee Croak, MD   40 mg at 09/15/20 0818   Facility-Administered Medications Ordered in Other Encounters  Medication Dose Route Frequency Provider Last Rate Last Admin  . morphine 2 MG/ML injection             Allergies as of 09/13/2020  . (No Known Allergies)   Review of Systems:    Constitutional: No weight loss, fever or chills Skin: No rash Cardiovascular: +atypical chest pain Respiratory: No SOB Gastrointestinal: See HPI and otherwise negative Genitourinary: No  dysuria Neurological: No headache, dizziness or syncope Musculoskeletal: No new muscle or joint pain Hematologic: No bleeding  Psychiatric: No history of depression or anxiety    Physical Exam:  Vital signs in last 24 hours: Temp:  [97.1 F (36.2 C)-98.3 F (36.8 C)] 98.3 F (36.8 C) (03/15 0800) Pulse Rate:  [65-93] 84 (03/15 0815) Resp:  [15-23] 20 (03/15 0800) BP: (92-168)/(45-87) 106/71 (03/15 0815) SpO2:  [93 %-98 %] 94 % (03/15 0814) Weight:  [74.4 kg] 74.4 kg (03/14 1145)   General:   Pleasant Hispanic male appears to be in NAD, Well developed, Well nourished, alert and cooperative Head:  Normocephalic and atraumatic. Eyes:   PEERL, EOMI. No icterus. Conjunctiva pink. Ears:  Normal auditory acuity. Neck:  Supple Throat: Oral cavity and pharynx without inflammation, swelling or lesion.  Lungs: Respirations even and unlabored. Lungs clear to auscultation bilaterally.   No wheezes, crackles, or rhonchi.  Heart: Normal S1, S2. No MRG. Regular rate and rhythm. No peripheral edema, cyanosis or pallor.  Abdomen:  Soft, nondistended, nontender. No rebound or guarding. Normal bowel sounds. No appreciable masses or hepatomegaly. Rectal:  Not performed.  Msk:  Symmetrical without gross deformities. Peripheral pulses intact.  Extremities:  Without edema, no deformity or joint abnormality.  Neurologic:  Alert and  oriented x4;  grossly normal neurologically.  Skin:   Dry and intact without significant lesions or rashes. Psychiatric: Demonstrates good judgement and reason without abnormal affect or behaviors.   LAB RESULTS: Recent Labs    09/13/20 1943 09/13/20 2305 09/15/20 0225  WBC 10.2 9.8 12.3*  HGB 15.7 14.9 14.2  HCT 46.7 44.1 41.7  PLT 232 204 191   BMET Recent Labs    09/14/20 0748 09/14/20 1037 09/15/20 0225  NA 138 141 136  K 3.5 3.4* 3.8  CL 103 107 101  CO2 21* 25 26  GLUCOSE 250* 158* 230*  BUN 24* 22 19  CREATININE 0.80 0.65 0.80  CALCIUM  9.0 9.3  8.6*   LFT Recent Labs    09/14/20 0224  PROT 6.7  ALBUMIN 3.9  AST 24  ALT 44  ALKPHOS 56  BILITOT 0.6  BILIDIR <0.1  IBILI NOT CALCULATED   STUDIES: DG Chest 2 View  Result Date: 09/13/2020 CLINICAL DATA:  Chest pain EXAM: CHEST - 2 VIEW COMPARISON:  09/10/2020, CT 01/22/2020 FINDINGS: Emphysematous disease. No focal opacity or pleural effusion. Stable cardiomediastinal silhouette. No pneumothorax. Calcified granuloma left lung base. IMPRESSION: No acute changes compared to prior.  Emphysematous disease Electronically Signed   By: Donavan Foil M.D.   On: 09/13/2020 20:17   NM Hepatobiliary Liver Func  Result Date: 09/14/2020 CLINICAL DATA:  Abdominal pain. Concern for acute cholecystitis. Gallstones on ultrasound exam EXAM: NUCLEAR MEDICINE HEPATOBILIARY IMAGING TECHNIQUE: Sequential images of the abdomen were obtained out to 60 minutes following intravenous administration of radiopharmaceutical. RADIOPHARMACEUTICALS:  5.1 mCi Tc-46m  Choletec IV COMPARISON:  Ultrasound 09/14/2020 FINDINGS: Prompt clearance radiotracer from blood pool and homogeneous uptake in liver. Counts are evident in the common bile duct and small bowel by 20 minutes. The gallbladder fails to fill at 1 hour. 30 mg of IV morphine was administered to augment filling of the gallbladder. The gallbladder filled with biliary excreted radiotracer after administration of morphine. IMPRESSION: 1. Visualization of the gallbladder indicates patent cystic duct and IN CONSISTENT with acute cholecystitis. 2. Patent common bile duct. Electronically Signed   By: Suzy Bouchard M.D.   On: 09/14/2020 15:21   US Abdomen Complete  Result Date: 09/14/2020 CLINICAL DATA:  Abdominal pain for 1 day.  Diabetes. EXAM: ABDOMEN ULTRASOUND COMPLETE COMPARISON:  CT abdomen pelvis 09/23/2015 FINDINGS: Gallbladder: Calcified stones noted within the gallbladder lumen. No gallbladder wall thickening visualized. No pericholecystic fluid. No  sonographic Murphy sign noted by sonographer. Common bile duct: Diameter: 8 mm.  Stable upper limits of normal. Liver: No focal lesion identified. Within normal limits in parenchymal echogenicity. Portal vein is patent on color Doppler imaging with normal direction of blood flow towards the liver. IVC: No abnormality visualized. Pancreas: Visualized portion unremarkable. Spleen: Size and appearance within normal limits. Right Kidney: Length: 10.8 cm. Echogenicity within normal limits. No mass or hydronephrosis visualized. Left Kidney: Length: 10.8 cm. Echogenicity within normal limits. No mass or hydronephrosis visualized. Abdominal aorta: No aneurysm visualized. Other findings: None. IMPRESSION: Cholelithiasis with no acute cholecystitis. Electronically Signed   By: Iven Finn M.D.   On: 09/14/2020 02:13     Impression / Plan:   Impression: 1.  Atypical chest pain: Patient being worked up by cardiology, undergoing a cardiac CTA today, though his symptoms sound very much like reflux 2.  Dysphagia: Over the past month and a half; consider stricture versus other 3.  GERD: Sounds somewhat chronic for the patient, EGD in 2017 with Candida esophagitis  Plan: 1.  Patient would likely benefit from an EGD with dilation.  We will try to put this on the schedule for tomorrow, but would appreciate cardiac clearance prior to this procedure given his ongoing cardiac work-up. 2.  Increased Pantoprazole to 40 mg twice daily 3.  Please await any further recommendations from Dr. Tarri Glenn later today  Thank you for your kind consultation, we will continue to follow.  Lavone Nian [facility]  09/15/2020, 10:39 AM

## 2020-09-15 NOTE — Progress Notes (Signed)
PROGRESS NOTE  Duane Cuevas  DOB: [birth year]  PCP: [name], MD SWH:[id number]  DOA: 09/13/2020  LOS: 0 days   Chief Complaint  Patient presents with   Chest Pain   Shortness of Breath   Brief narrative: Duane Cuevas is a 65 y.o. male with PMH significant for DM2, HTN, COPD, current everyday smoker. Patient presented to the ED on 3/13 with complaint of chest pain for 5 days 3/10, patient had been to the ED with shortness of breath and chest pain and was discharged home with a few days of prednisone 20 mg daily.  He felt fine at first but chest pain again recurred in 24 hours accompanied by epigastric pain, some nausea and vomiting and hence he returned back to ED. In the ED, patient was afebrile, heart rate was 115, blood pressure stable.  Breathing on room air. Blood work with blood sugar level 702, creatinine 1.26, serum osmolality elevated to 327, CBC unremarkable, troponin negative, D-dimer normal Chest x-ray unremarkable with some chronic emphysematous changes EKG with normal sinus rhythm. Patient was admitted to hospital medicine service  Subjective: Patient was seen and examined this morning.  Lying on bed.  Not in distress.  Patient complains that for last few weeks, he is having difficulty passing through solid or liquid and feels is stuck in the chest.  HIDA scan yesterday negative.  Assessment/Plan: Chest pain Right upper quadrant/epigastric tenderness -Presented with chest pain, nausea, vomiting.  On examination patient has precordial tenderness.  -Cardiac and noncardiac etiologies were considered.  Troponin normal x3.  EKG without significant ST-T wave changes. -Per admission H&P, stress test last year was normal. -Cardiology consult was obtained.  Plan for cardiac CT today. -Based on his symptom profile, patient could be having reflux/esophageal dysphagia issues.  GI consultation called.  Planned for EGD tomorrow.  Continue PPI -3/14, HIDA  scan negative.  Hyperosmolar hyperglycemic nonketotic state -Initial blood glucose level over 700, anion gap 30 elevated to 16, pH normal, serum osmolality elevated to 327 -Started on IV fluid, infusion drip and serial metabolic panel.  Gradually switch to subcu insulin with Lantus and sliding scale.  Uncontrolled type 2 diabetes mellitus -A1c 10.5 on 08/21/2020 -Home meds include glipizide 10 mg daily, Metformin 1000 mg twice daily -Currently on Lantus 10 units daily and sliding scale insulin.  I will also resume glipizide 10 mg twice daily today.  Continue to hold Metformin. Recent Labs  Lab 09/14/20 1416 09/14/20 1535 09/14/20 2134 09/14/20 2338 09/15/20 0741  GLUCAP 124* 134* 304* 259* 204*   Essential hypertension  -Controlled on amlodipine.    COPD Current everyday smoker -COPD stable.  Continue bronchodilators.  Counseled to quit smoking.   Hyperlipidemia  -on statins.  Mobility: Encourage ambulation Code Status:   Code Status: Full Code  Nutritional status: Body mass index is 24.94 kg/m.     Diet Order            Diet Carb Modified Fluid consistency: Thin; Room service appropriate? Yes  Diet effective now               Start diet after HIDA scan DVT prophylaxis: enoxaparin (LOVENOX) injection 40 mg Start: 09/14/20 1000   Antimicrobials:  None Fluid: Switch IV fluid to normal saline at 75 mill per hour Consultants: Cardiology Family Communication:  Family not at bedside today.  Status is: Inpatient  The patient will require inpatient care because -pending cardiac studies, potential EGD tomorrow.  Dispo: The patient is from: Home  Anticipated d/c is to: Home in 2 to 3 days              Patient currently is not medically stable to d/c.   Difficult to place patient No       Infusions:   dextrose 5% lactated ringers 125 mL/hr at 09/14/20 1645   lactated ringers Stopped (09/14/20 0422)    Scheduled Meds:  albuterol  4 puff  Inhalation BID   amLODipine  5 mg Oral Daily   atorvastatin  20 mg Oral Daily   chlorhexidine  15 mL Mouth Rinse BID   Chlorhexidine Gluconate Cloth  6 each Topical Daily   enoxaparin (LOVENOX) injection  40 mg Subcutaneous Q24H   insulin aspart  0-5 Units Subcutaneous QHS   insulin aspart  0-9 Units Subcutaneous TID WC   insulin glargine  10 Units Subcutaneous Daily   mouth rinse  15 mL Mouth Rinse q12n4p   mometasone-formoterol  2 puff Inhalation BID   pantoprazole (PROTONIX) IV  40 mg Intravenous Q12H    Antimicrobials: Anti-infectives (From admission, onward)   None      PRN meds: alum & mag hydroxide-simeth, dextrose   Objective: Vitals:   09/15/20 0815 09/15/20 1129  BP: 106/71 108/75  Pulse: 84   Resp:    Temp:    SpO2:      Intake/Output Summary (Last 24 hours) at 09/15/2020 1135 Last data filed at 09/15/2020 0730 Gross per 24 hour  Intake 1810.27 ml  Output 1750 ml  Net 60.27 ml   Filed Weights   09/14/20 1145  Weight: 74.4 kg   Weight change:  Body mass index is 24.94 kg/m.   Physical Exam: General exam: Pleasant Hispanic male Skin: No rashes, lesions or ulcers. HEENT: Atraumatic, normocephalic, no obvious bleeding Lungs: Clear to auscultation bilaterally CVS: Regular rate and rhythm, no murmur.  Precordial tenderness present. GI/Abd soft, mild epigastric tenderness present. nondistended, bowel sound present CNS: Alert, awake, oriented x3 Psychiatry: Mood appropriate Extremities: No pedal edema, no calf tenderness  Data Review: I have personally reviewed the laboratory data and studies available.  Recent Labs  Lab 09/10/20 1304 09/13/20 1943 09/13/20 2305 09/15/20 0225  WBC 10.0 10.2 9.8 12.3*  NEUTROABS  --   --   --  9.7*  HGB 16.7 15.7 14.9 14.2  HCT 50.6 46.7 44.1 41.7  MCV 94.4 94.5 94.6 92.9  PLT 244 232 204 191   Recent Labs  Lab 09/13/20 2305 09/14/20 0305 09/14/20 0748 09/14/20 1037 09/15/20 0225  NA 135 140  138 141 136  K 3.9 3.2* 3.5 3.4* 3.8  CL 102 110 103 107 101  CO2 23 24 21* 25 26  GLUCOSE 525* 243* 250* 158* 230*  BUN 33* 25* 24* 22 19  CREATININE 1.17 0.80 0.80 0.65 0.80  CALCIUM 9.3 8.0* 9.0 9.3 8.6*  MG  --   --   --   --  2.0  PHOS  --   --   --   --  2.8    F/u labs ordered Unresulted Labs (From admission, onward)          Start     Ordered   09/20/20 0500  Creatinine, serum  (enoxaparin (LOVENOX)    CrCl >/= 30 ml/min)  Weekly,   R     Comments: while on enoxaparin therapy    09/13/20 2306   09/15/20 0500  CBC with Differential/Platelet  Daily,   R     Question:  Specimen collection method  Answer:  Lab=Lab collect   09/14/20 1346   09/15/20 9539  Basic metabolic panel  Daily,   R     Question:  Specimen collection method  Answer:  Lab=Lab collect   09/14/20 1346          Signed, Terrilee Croak, MD [facility] 09/15/2020

## 2020-09-15 NOTE — Progress Notes (Signed)
Inpatient Diabetes Program Recommendations  AACE/ADA: New Consensus Statement on Inpatient Glycemic Control (2015)  Target Ranges:  Prepandial:   less than 140 mg/dL      Peak postprandial:   less than 180 mg/dL (1-2 hours)      Critically ill patients:  140 - 180 mg/dL   Lab Results  Component Value Date   GLUCAP 305 (H) 09/15/2020   HGBA1C 10.5 (A) 08/21/2020    Review of Glycemic Control  Diabetes history: DM2 Outpatient Diabetes medications: glipizide 10 mg BID, metformin 1000 mg BID Current orders for Inpatient glycemic control: Lantus 10 units QD, Novolog 0-9 units TID with meals and 0-5 HS + 3 units TID  HgbA1C - 10.5% CBGs 204, 305  Inpatient Diabetes Program Recommendations:     Increase Lantus to 15 units QD Increase Novolog to 5 units TID with meals Educated patient, spouse and daughter on insulin pen use at home. Reviewed contents of insulin flexpen starter kit. Reviewed all steps if insulin pen including attachment of needle, 2-unit air shot, dialing up dose, giving injection, removing needle, disposal of sharps, storage of unused insulin, disposal of insulin etc. Patient able to provide successful return demonstration. Also reviewed troubleshooting with insulin pen. MD to give patient Rxs for insulin pens and insulin pen needles.  Discussed A1C results (10.5%) and explained what an A1C is and informed patient that his current A1C indicates an average glucose of 255 mg/dl over the past 2-3 months. Discussed basic pathophysiology of DM Type 2, basic home care, importance of checking CBGs and maintaining good CBG control to prevent long-term and short-term complications. Reviewed glucose and A1C goals and explained that patient will need to continue to  Reviewed signs and symptoms of hyperglycemia and hypoglycemia along with treatment for both. Discussed impact of nutrition, exercise, stress, sickness, and medications on diabetes control. Reviewed Living Well with diabetes  booklet and encouraged patient to read through entire book.   Awaiting benefits check for Lantus and Novolog pens.  Pt appreciative of visit and seems motivated to control his blood sugars.   Thank you. Lorenda Peck, RD, LDN, CDE Inpatient Diabetes Coordinator [phone]

## 2020-09-15 NOTE — Progress Notes (Signed)
Coronary CTA completed.  No CAD.  Mild aortic atherosclerosis.  Non cardiac chest pain.  No further cardiac workup.  Will sign off.

## 2020-09-15 NOTE — H&P (View-Only) (Signed)
Consultation  Referring Provider: Dr. Pietro Cassis     Primary Care Physician:  [name], MD Primary Gastroenterologist: Dr. Havery Moros       Reason for Consultation: Epigastric pain             HPI:   Duane Cuevas is a 65 y.o. Hispanic male with a past medical history of COPD, ongoing tobacco abuse, diabetes, hypertension and others listed below, who presented to the hospital on 09/13/2020 with chest pain.  We are consulted in regards to some epigastric pain/dysphagia.    Today, the patient explains that over the past month and half he has had a couple of episodes of what he describes as chest pain but tells me it feels like "fire/burning", describes as radiating up his chest and over to the left side, this is definitely worse after he eats any food and feels to come on at the same time as reflux symptoms.  Also describes episodes of dysphagia where food feels like it is getting stuck, he just has to drink a few sips of water for to go down, and this has occurred 3 or 4 times over the past month and a half.  He describes a history of being on some medicine for his reflux but is not sure what this is.  Associated symptoms included tightness and nausea.  No vomiting.    Denies fever, chills, change in bowel habits or blood in his stool.  ED course: Chest x-ray showing emphysematous changes, EKG showing normal sinus rhythm but sensitive troponin was negative, lab significant for blood glucose of 702, anion gap 16, creatinine 1.2  GI history: 05/10/2018 colonoscopy Dr. Havery Moros: Diverticulosis in the sigmoid and ascending colon, 1 3 mm polyp in the cecum, 1 4 mm polyp at the rectosigmoid colon, tortuous colon and otherwise normal ; pathology showed adenomatous polyps, repeat recommended in 5 years 09/23/2015 EGD Dr. Fuller Plan: Nonbleeding diffuse erosive gastropathy, monilial esophagitis, normal duodenum; started on Diflucan  Past Medical History:  Diagnosis Date  . Diabetes mellitus  ([place]) 10/2014   pt denies being diabetic.   . Emphysema of lung ([place])   . Emphysema/COPD ([place]) 10/2014  . Hepatic steatosis   . Thrombocytopenia ([place]) 09/2015   platelets in 120s.     Past Surgical History:  Procedure Laterality Date  . ESOPHAGOGASTRODUODENOSCOPY N/A 09/23/2015   Procedure: ESOPHAGOGASTRODUODENOSCOPY (EGD);  Surgeon: Ladene Artist, MD;  Location: [facility];  Service: Endoscopy;  Laterality: N/A;  . FOOT SURGERY    . INGUINAL HERNIA REPAIR Left 05/15/2018   Procedure: LEFT INGUINAL HERNIA REPAIR WITH MESH;  Surgeon: Erroll Luna, MD;  Location: [place];  Service: General;  Laterality: Left;  . INSERTION OF MESH Left 05/15/2018   Procedure: INSERTION OF MESH;  Surgeon: Erroll Luna, MD;  Location: [place];  Service: General;  Laterality: Left;  . UPPER GASTROINTESTINAL ENDOSCOPY      Family History  Problem Relation Age of Onset  . Emphysema Mother   . Diabetes Mother   . Emphysema Father   . Cancer Sister        Stomach cancer  . Diabetes Sister   . Stomach cancer Sister   . Diabetes Brother   . Colon cancer Neg Hx   . Colon polyps Neg Hx   . Esophageal cancer Neg Hx   . Rectal cancer Neg Hx     Social History   Tobacco Use  . Smoking status: Current Every Day Smoker  Packs/day: 0.15    Years: 47.00    Pack years: 7.05    Types: Cigarettes  . Smokeless tobacco: Never Used  Vaping Use  . Vaping Use: Former  Substance Use Topics  . Alcohol use: Yes    Comment: occ  . Drug use: No    Prior to Admission medications   Medication Sig Start Date End Date Taking? Authorizing Provider  acetaminophen (TYLENOL) 500 MG tablet Take 1,000 mg by mouth every 6 (six) hours as needed for moderate pain or headache.   Yes [provider]  albuterol (PROVENTIL) (2.5 MG/3ML) 0.083% nebulizer solution TAKE 3 ML(2.5 MG TOTAL) BY NEBULIZATION EVERY 6(SIX) HOURS AS NEEDED Patient taking differently: Take 2.5 mg by nebulization every 6 (six) hours as  needed for wheezing or shortness of breath. 08/05/20  Yes Sagardia, Ines Bloomer, MD  amLODipine (NORVASC) 5 MG tablet Take 1 tablet (5 mg total) by mouth daily. 08/21/20  Yes Maximiano Coss, NP  atorvastatin (LIPITOR) 20 MG tablet Take 1 tablet (20 mg total) by mouth daily. 06/22/20  Yes Sagardia, Ines Bloomer, MD  budesonide-formoterol [facility]) 160-4.5 MCG/ACT inhaler Inhale 2 puffs into the lungs 2 (two) times daily. 06/22/20  Yes Sagardia, Ines Bloomer, MD  glipiZIDE (GLUCOTROL) 10 MG tablet Take 1 tablet (10 mg total) by mouth 2 (two) times daily before a meal. 08/21/20  Yes Maximiano Coss, NP  meclizine (ANTIVERT) 25 MG tablet TAKE ONE TABLET BY MOUTH THREE TIMES A DAY AS NEEDED FOR DIZZINESS Patient taking differently: Take 25 mg by mouth daily as needed for dizziness or nausea. 07/31/20  Yes [name], MD  metFORMIN (GLUCOPHAGE) 1000 MG tablet Take 1 tablet (1,000 mg total) by mouth 2 (two) times daily with a meal. 06/22/20 09/20/20 Yes Sagardia, Ines Bloomer, MD  predniSONE (DELTASONE) 20 MG tablet Take 2 tablets (40 mg total) by mouth daily. Patient taking differently: Take 40 mg by mouth daily as needed (pain). 06/26/20  Yes Blanchie Dessert, MD  aspirin EC 81 MG EC tablet Take 1 tablet (81 mg total) by mouth daily. Patient not taking: No sig reported 11/03/19   Annita Brod, MD  predniSONE (DELTASONE) 20 MG tablet Take 2 tablets (40 mg total) by mouth daily for 5 days. Patient not taking: No sig reported 09/10/20 09/15/20  Lucrezia Starch, MD    Current Facility-Administered Medications  Medication Dose Route Frequency Provider Last Rate Last Admin  . albuterol (VENTOLIN HFA) 108 (90 Base) MCG/ACT inhaler 4 puff  4 puff Inhalation BID Dahal, Binaya, MD      . alum & mag hydroxide-simeth (MAALOX/MYLANTA) 200-200-20 MG/5ML suspension 15 mL  15 mL Oral Q6H PRN Dahal, Binaya, MD      . amLODipine (NORVASC) tablet 5 mg  5 mg Oral Daily Rise Patience, MD   5 mg at  09/14/20 0835  . atorvastatin (LIPITOR) tablet 20 mg  20 mg Oral Daily Rise Patience, MD   20 mg at 09/14/20 0836  . chlorhexidine (PERIDEX) 0.12 % solution 15 mL  15 mL Mouth Rinse BID Terrilee Croak, MD   15 mL at 09/14/20 2021  . Chlorhexidine Gluconate Cloth 2 % PADS 6 each  6 each Topical Daily Dahal, Marlowe Aschoff, MD   6 each at 09/14/20 1022  . dextrose 5 % in lactated ringers infusion   Intravenous Continuous Rise Patience, MD 125 mL/hr at 09/14/20 1645 Infusion Verify at 09/14/20 1645  . dextrose 50 % solution 0-50 mL  0-50 mL  Intravenous PRN Rise Patience, MD      . enoxaparin (LOVENOX) injection 40 mg  40 mg Subcutaneous Q24H Rise Patience, MD   40 mg at 09/14/20 0841  . insulin aspart (novoLOG) injection 0-5 Units  0-5 Units Subcutaneous QHS Dahal, Binaya, MD      . insulin aspart (novoLOG) injection 0-9 Units  0-9 Units Subcutaneous TID WC Dahal, Binaya, MD      . insulin glargine (LANTUS) injection 10 Units  10 Units Subcutaneous Daily Dahal, Marlowe Aschoff, MD   10 Units at 09/14/20 1539  . lactated ringers infusion   Intravenous Continuous Rise Patience, MD   Stopped at 09/14/20 0422  . MEDLINE mouth rinse  15 mL Mouth Rinse q12n4p Dahal, Binaya, MD   15 mL at 09/14/20 1623  . mometasone-formoterol (DULERA) 200-5 MCG/ACT inhaler 2 puff  2 puff Inhalation BID Rise Patience, MD   2 puff at 09/15/20 [phone]  . pantoprazole (PROTONIX) injection 40 mg  40 mg Intravenous Q24H Terrilee Croak, MD   40 mg at 09/15/20 0818   Facility-Administered Medications Ordered in Other Encounters  Medication Dose Route Frequency Provider Last Rate Last Admin  . morphine 2 MG/ML injection             Allergies as of 09/13/2020  . (No Known Allergies)   Review of Systems:    Constitutional: No weight loss, fever or chills Skin: No rash Cardiovascular: +atypical chest pain Respiratory: No SOB Gastrointestinal: See HPI and otherwise negative Genitourinary: No  dysuria Neurological: No headache, dizziness or syncope Musculoskeletal: No new muscle or joint pain Hematologic: No bleeding  Psychiatric: No history of depression or anxiety    Physical Exam:  Vital signs in last 24 hours: Temp:  [97.1 F (36.2 C)-98.3 F (36.8 C)] 98.3 F (36.8 C) (03/15 0800) Pulse Rate:  [65-93] 84 (03/15 0815) Resp:  [15-23] 20 (03/15 0800) BP: (92-168)/(45-87) 106/71 (03/15 0815) SpO2:  [93 %-98 %] 94 % (03/15 0814) Weight:  [74.4 kg] 74.4 kg (03/14 1145)   General:   Pleasant Hispanic male appears to be in NAD, Well developed, Well nourished, alert and cooperative Head:  Normocephalic and atraumatic. Eyes:   PEERL, EOMI. No icterus. Conjunctiva pink. Ears:  Normal auditory acuity. Neck:  Supple Throat: Oral cavity and pharynx without inflammation, swelling or lesion.  Lungs: Respirations even and unlabored. Lungs clear to auscultation bilaterally.   No wheezes, crackles, or rhonchi.  Heart: Normal S1, S2. No MRG. Regular rate and rhythm. No peripheral edema, cyanosis or pallor.  Abdomen:  Soft, nondistended, nontender. No rebound or guarding. Normal bowel sounds. No appreciable masses or hepatomegaly. Rectal:  Not performed.  Msk:  Symmetrical without gross deformities. Peripheral pulses intact.  Extremities:  Without edema, no deformity or joint abnormality.  Neurologic:  Alert and  oriented x4;  grossly normal neurologically.  Skin:   Dry and intact without significant lesions or rashes. Psychiatric: Demonstrates good judgement and reason without abnormal affect or behaviors.   LAB RESULTS: Recent Labs    09/13/20 1943 09/13/20 2305 09/15/20 0225  WBC 10.2 9.8 12.3*  HGB 15.7 14.9 14.2  HCT 46.7 44.1 41.7  PLT 232 204 191   BMET Recent Labs    09/14/20 0748 09/14/20 1037 09/15/20 0225  NA 138 141 136  K 3.5 3.4* 3.8  CL 103 107 101  CO2 21* 25 26  GLUCOSE 250* 158* 230*  BUN 24* 22 19  CREATININE 0.80 0.65 0.80  CALCIUM  9.0 9.3  8.6*   LFT Recent Labs    09/14/20 0224  PROT 6.7  ALBUMIN 3.9  AST 24  ALT 44  ALKPHOS 56  BILITOT 0.6  BILIDIR <0.1  IBILI NOT CALCULATED   STUDIES: DG Chest 2 View  Result Date: 09/13/2020 CLINICAL DATA:  Chest pain EXAM: CHEST - 2 VIEW COMPARISON:  09/10/2020, CT 01/22/2020 FINDINGS: Emphysematous disease. No focal opacity or pleural effusion. Stable cardiomediastinal silhouette. No pneumothorax. Calcified granuloma left lung base. IMPRESSION: No acute changes compared to prior.  Emphysematous disease Electronically Signed   By: Donavan Foil M.D.   On: 09/13/2020 20:17   NM Hepatobiliary Liver Func  Result Date: 09/14/2020 CLINICAL DATA:  Abdominal pain. Concern for acute cholecystitis. Gallstones on ultrasound exam EXAM: NUCLEAR MEDICINE HEPATOBILIARY IMAGING TECHNIQUE: Sequential images of the abdomen were obtained out to 60 minutes following intravenous administration of radiopharmaceutical. RADIOPHARMACEUTICALS:  5.1 mCi Tc-57m  Choletec IV COMPARISON:  Ultrasound 09/14/2020 FINDINGS: Prompt clearance radiotracer from blood pool and homogeneous uptake in liver. Counts are evident in the common bile duct and small bowel by 20 minutes. The gallbladder fails to fill at 1 hour. 30 mg of IV morphine was administered to augment filling of the gallbladder. The gallbladder filled with biliary excreted radiotracer after administration of morphine. IMPRESSION: 1. Visualization of the gallbladder indicates patent cystic duct and IN CONSISTENT with acute cholecystitis. 2. Patent common bile duct. Electronically Signed   By: Suzy Bouchard M.D.   On: 09/14/2020 15:21   US Abdomen Complete  Result Date: 09/14/2020 CLINICAL DATA:  Abdominal pain for 1 day.  Diabetes. EXAM: ABDOMEN ULTRASOUND COMPLETE COMPARISON:  CT abdomen pelvis 09/23/2015 FINDINGS: Gallbladder: Calcified stones noted within the gallbladder lumen. No gallbladder wall thickening visualized. No pericholecystic fluid. No  sonographic Murphy sign noted by sonographer. Common bile duct: Diameter: 8 mm.  Stable upper limits of normal. Liver: No focal lesion identified. Within normal limits in parenchymal echogenicity. Portal vein is patent on color Doppler imaging with normal direction of blood flow towards the liver. IVC: No abnormality visualized. Pancreas: Visualized portion unremarkable. Spleen: Size and appearance within normal limits. Right Kidney: Length: 10.8 cm. Echogenicity within normal limits. No mass or hydronephrosis visualized. Left Kidney: Length: 10.8 cm. Echogenicity within normal limits. No mass or hydronephrosis visualized. Abdominal aorta: No aneurysm visualized. Other findings: None. IMPRESSION: Cholelithiasis with no acute cholecystitis. Electronically Signed   By: Iven Finn M.D.   On: 09/14/2020 02:13     Impression / Plan:   Impression: 1.  Atypical chest pain: Patient being worked up by cardiology, undergoing a cardiac CTA today, though his symptoms sound very much like reflux 2.  Dysphagia: Over the past month and a half; consider stricture versus other 3.  GERD: Sounds somewhat chronic for the patient, EGD in 2017 with Candida esophagitis  Plan: 1.  Patient would likely benefit from an EGD with dilation.  We will try to put this on the schedule for tomorrow, but would appreciate cardiac clearance prior to this procedure given his ongoing cardiac work-up. 2.  Increased Pantoprazole to 40 mg twice daily 3.  Please await any further recommendations from Dr. Tarri Glenn later today  Thank you for your kind consultation, we will continue to follow.  Lavone Nian [facility]  09/15/2020, 10:39 AM

## 2020-09-16 ENCOUNTER — Encounter (HOSPITAL_COMMUNITY)
Admission: EM | Disposition: A | Discharge status: Home / Self Care | Payer: Self-pay | Source: Home / Self Care | Attending: Internal Medicine

## 2020-09-16 ENCOUNTER — Encounter (HOSPITAL_COMMUNITY): Payer: Self-pay | Admitting: Internal Medicine

## 2020-09-16 ENCOUNTER — Other Ambulatory Visit (HOSPITAL_COMMUNITY): Payer: Self-pay | Admitting: Internal Medicine

## 2020-09-16 ENCOUNTER — Inpatient Hospital Stay (HOSPITAL_COMMUNITY): Payer: Medicare HMO | Admitting: Certified Registered Nurse Anesthetist

## 2020-09-16 DIAGNOSIS — K253 Acute gastric ulcer without hemorrhage or perforation: Secondary | ICD-10-CM

## 2020-09-16 DIAGNOSIS — R1310 Dysphagia, unspecified: Secondary | ICD-10-CM

## 2020-09-16 DIAGNOSIS — K3189 Other diseases of stomach and duodenum: Secondary | ICD-10-CM

## 2020-09-16 DIAGNOSIS — K209 Esophagitis, unspecified without bleeding: Secondary | ICD-10-CM

## 2020-09-16 HISTORY — PX: BIOPSY: SHX5522

## 2020-09-16 HISTORY — PX: ESOPHAGOGASTRODUODENOSCOPY (EGD) WITH PROPOFOL: SHX5813

## 2020-09-16 LAB — BASIC METABOLIC PANEL
Anion gap: 8 (ref 5–15)
BUN: 16 mg/dL (ref 8–23)
CO2: 26 mmol/L (ref 22–32)
Calcium: 8.8 mg/dL — ABNORMAL LOW (ref 8.9–10.3)
Chloride: 105 mmol/L (ref 98–111)
Creatinine, Ser: 0.85 mg/dL (ref 0.61–1.24)
GFR, Estimated: >60 mL/min (ref >60)
Glucose, Bld: 127 mg/dL — ABNORMAL HIGH (ref 70–99)
Potassium: 3.8 mmol/L (ref 3.5–5.1)
Sodium: 139 mmol/L (ref 135–145)

## 2020-09-16 LAB — CBC WITH DIFFERENTIAL/PLATELET
Abs Immature Granulocytes: 0.08 10*3/uL — ABNORMAL HIGH (ref 0.00–0.07)
Basophils Absolute: 0 10*3/uL (ref 0.0–0.1)
Basophils Relative: 0 %
Eosinophils Absolute: 0.2 10*3/uL (ref 0.0–0.5)
Eosinophils Relative: 2 %
HCT: 46.3 % (ref 39.0–52.0)
Hemoglobin: 15.4 g/dL (ref 13.0–17.0)
Immature Granulocytes: 1 %
Lymphocytes Relative: 25 %
Lymphs Abs: 2.5 10*3/uL (ref 0.7–4.0)
MCH: 31.3 pg (ref 26.0–34.0)
MCHC: 33.3 g/dL (ref 30.0–36.0)
MCV: 94.1 fL (ref 80.0–100.0)
Monocytes Absolute: 0.8 10*3/uL (ref 0.1–1.0)
Monocytes Relative: 7 %
Neutro Abs: 6.7 10*3/uL (ref 1.7–7.7)
Neutrophils Relative %: 65 %
Platelets: 199 10*3/uL (ref 150–400)
RBC: 4.92 MIL/uL (ref 4.22–5.81)
RDW: 12.1 % (ref 11.5–15.5)
WBC: 10.3 10*3/uL (ref 4.0–10.5)
nRBC: 0 % (ref 0.0–0.2)

## 2020-09-16 LAB — GLUCOSE, CAPILLARY
Glucose-Capillary: 114 mg/dL — ABNORMAL HIGH (ref 70–99)
Glucose-Capillary: 98 mg/dL (ref 70–99)
Glucose-Capillary: 77 mg/dL (ref 70–99)

## 2020-09-16 SURGERY — ESOPHAGOGASTRODUODENOSCOPY (EGD) WITH PROPOFOL
Anesthesia: Monitor Anesthesia Care

## 2020-09-16 MED ORDER — INSULIN LISPRO (1 UNIT DIAL) 100 UNIT/ML (KWIKPEN): 3.0000 [IU] | PEN_INJECTOR | Freq: Three times a day (TID) | SUBCUTANEOUS | 0 refills | Status: DC

## 2020-09-16 MED ORDER — ALUM & MAG HYDROXIDE-SIMETH 200-200-20 MG/5ML PO SUSP: 15.0000 mL | Freq: Four times a day (QID) | ORAL | 0 refills | Status: DC | PRN

## 2020-09-16 MED ORDER — LIDOCAINE 2% (20 MG/ML) 5 ML SYRINGE
INTRAMUSCULAR | Status: DC | PRN
  Administered 2020-09-16: 100 mg via INTRAVENOUS

## 2020-09-16 MED ORDER — FLUCONAZOLE 200 MG PO TABS
200.0000 mg | ORAL_TABLET | Freq: Once | ORAL | Status: AC
  Administered 2020-09-16: 200 mg via ORAL
  Filled 2020-09-16: qty 1
  Filled 2020-09-16: qty 2

## 2020-09-16 MED ORDER — FLUCONAZOLE 100 MG PO TABS: 100.0000 mg | ORAL_TABLET | Freq: Every day | ORAL | 0 refills | Status: DC

## 2020-09-16 MED ORDER — PROPOFOL 10 MG/ML IV BOLUS
INTRAVENOUS | Status: DC | PRN
  Administered 2020-09-16 (×2): 50 mg via INTRAVENOUS

## 2020-09-16 MED ORDER — PANTOPRAZOLE SODIUM 40 MG PO TBEC: 40.0000 mg | DELAYED_RELEASE_TABLET | Freq: Two times a day (BID) | ORAL | 2 refills | Status: DC

## 2020-09-16 MED ORDER — INSULIN GLARGINE 100 UNIT/ML ~~LOC~~ SOLN
15.0000 [IU] | Freq: Every day | SUBCUTANEOUS | Status: DC
  Administered 2020-09-16: 15 [IU] via SUBCUTANEOUS
  Filled 2020-09-16: qty 0.15

## 2020-09-16 MED ORDER — LACTATED RINGERS IV SOLN: INTRAVENOUS | Status: DC | PRN

## 2020-09-16 MED ORDER — INSULIN GLARGINE 100 UNIT/ML SOLOSTAR PEN: 10.0000 [IU] | PEN_INJECTOR | Freq: Every day | SUBCUTANEOUS | 0 refills | Status: DC

## 2020-09-16 MED ORDER — PROPOFOL 500 MG/50ML IV EMUL
INTRAVENOUS | Status: DC | PRN
  Administered 2020-09-16: 100 ug/kg/min via INTRAVENOUS

## 2020-09-16 MED ORDER — PROPOFOL 500 MG/50ML IV EMUL
INTRAVENOUS | Status: AC
  Filled 2020-09-16: qty 50

## 2020-09-16 MED ORDER — SODIUM CHLORIDE 0.9 % IV SOLN: INTRAVENOUS | Status: DC

## 2020-09-16 MED ORDER — FLUCONAZOLE 100 MG PO TABS: 100.0000 mg | ORAL_TABLET | Freq: Every day | ORAL | Status: DC

## 2020-09-16 SURGICAL SUPPLY — 15 items

## 2020-09-16 NOTE — Anesthesia Procedure Notes (Signed)
Procedure Name: MAC Date/Time: 09/16/2020 1:18 PM Performed by: Deliah Boston, CRNA Pre-anesthesia Checklist: Patient identified, Emergency Drugs available, Suction available and Patient being monitored Patient Re-evaluated:Patient Re-evaluated prior to induction Oxygen Delivery Method: Simple face mask Preoxygenation: Pre-oxygenation with 100% oxygen Induction Type: IV induction Placement Confirmation: positive ETCO2 and breath sounds checked- equal and bilateral

## 2020-09-16 NOTE — Anesthesia Preprocedure Evaluation (Addendum)
Anesthesia Evaluation  Patient identified by MRN, date of birth, ID band Patient awake    Reviewed: Allergy & Precautions, NPO status , Patient's Chart, lab work & pertinent test results  History of Anesthesia Complications Negative for: history of anesthetic complications  Airway Mallampati: II  TM Distance: >3 FB Neck ROM: Full    Dental no notable dental hx. (+) Edentulous Upper, Edentulous Lower, Dental Advidsory Given   Pulmonary COPD,  COPD inhaler, Current Smoker,    Pulmonary exam normal        Cardiovascular hypertension, Pt. on medications + CAD and + Past MI  Normal cardiovascular exam  Coronary CT 3/22 1. Coronary calcium score of 0. This was 0 percentile for age and sex matched control. 2. Normal coronary origin with right dominance. 3. No evidence of CAD. CAD RADS 0. 4. Aortic atherosclerosis. 5. Consider non atherosclerotic causes of chest pain.   Neuro/Psych negative neurological ROS  negative psych ROS   GI/Hepatic negative GI ROS, Hepatic steatosis   Endo/Other  diabetes, Poorly Controlled, Type 2, Oral Hypoglycemic Agents  Renal/GU negative Renal ROS  negative genitourinary   Musculoskeletal negative musculoskeletal ROS (+)   Abdominal   Peds  Hematology negative hematology ROS (+)   Anesthesia Other Findings   Reproductive/Obstetrics                            Anesthesia Physical  Anesthesia Plan  ASA: III  Anesthesia Plan: MAC   Post-op Pain Management:    Induction: Intravenous  PONV Risk Score and Plan: 1 and Ondansetron, Midazolam, Dexamethasone and Treatment may vary due to age or medical condition  Airway Management Planned: Natural Airway, Nasal Cannula and Simple Face Mask  Additional Equipment: None  Intra-op Plan:   Post-operative Plan:   Informed Consent: I have reviewed the patients History and Physical, chart, labs and discussed the  procedure including the risks, benefits and alternatives for the proposed anesthesia with the patient or authorized representative who has indicated his/her understanding and acceptance.     Dental Advisory Given  Plan Discussed with: Anesthesiologist, CRNA and Surgeon  Anesthesia Plan Comments:        Anesthesia Quick Evaluation

## 2020-09-16 NOTE — Op Note (Signed)
[facility] Patient Name: Duane Cuevas Procedure Date: 09/16/2020 MRN: [id number] Attending MD: Thornton Park MD, MD Date of Birth: [birth year] CSN: [id number] Age: 65 Admit Type: Inpatient Procedure:                Upper GI endoscopy Indications:              Dysphagia, history of candideal esophagitis Providers:                Thornton Park MD, MD, Kary Kos RN, RN, Tyna Jaksch Technician Referring MD:              Medicines:                Monitored Anesthesia Care Complications:            No immediate complications. Estimated blood loss:                            Minimal. Estimated Blood Loss:     Estimated blood loss was minimal. Procedure:                Pre-Anesthesia Assessment:                           - Prior to the procedure, a History and Physical                            was performed, and patient medications and                            allergies were reviewed. The patient's tolerance of                            previous anesthesia was also reviewed. The risks                            and benefits of the procedure and the sedation                            options and risks were discussed with the patient.                            All questions were answered, and informed consent                            was obtained. Prior Anticoagulants: The patient has                            taken no previous anticoagulant or antiplatelet                            agents. ASA Grade Assessment: II - A patient with  mild systemic disease. After reviewing the risks                            and benefits, the patient was deemed in                            satisfactory condition to undergo the procedure.                           After obtaining informed consent, the endoscope was                            passed under direct vision. Throughout the                             procedure, the patient's blood pressure, pulse, and                            oxygen saturations were monitored continuously. The                            GIF-H190 ([id number]) Olympus gastroscope was                            introduced through the mouth, and advanced to the                            third part of duodenum. The upper GI endoscopy was                            accomplished without difficulty. The patient                            tolerated the procedure well. Scope In: Scope Out: Findings:      Diffuse, white plaques were found in the entire esophagus. Biopsies were       taken with a cold forceps for histology. Estimated blood loss was       minimal.      The Z-line was regular and was found 38 cm from the incisors.      Scattered gastric erosions were present in the gastric antrum. Biopsies       were taken from the antrum, body, and fundus with a cold forceps for       histology. Estimated blood loss was minimal.      The examined duodenum was normal. Impression:               - Esophageal plaques were found, consistent with                            candidiasis. Biopsied. This is the likely cause of                            dysphagia.                           -  Z-line regular, 38 cm from the incisors.                           - Mild erosive gastropathy with no bleeding and no                            stigmata of recent bleeding. Biopsied.                           - Normal examined duodenum. Moderate Sedation:      Not Applicable - Patient had care per Anesthesia. Recommendation:           - Return patient to hospital ward for ongoing care.                           - Advance diet as tolerated.                           - Avoid all NSAIDs.                           - Continue present medications including                            pantoprazole 40 mg BID.                           - Diflucan 200 mg x 1 today, then 100mg  daily x 9                             days.                           - Await pathology results.                           The inpatient GI team will move to stand-by. Please                            call with any questions or concerns in the meantime. Procedure Code(s):        --- Professional ---                           [phone], Esophagogastroduodenoscopy, flexible,                            transoral; with biopsy, single or multiple Diagnosis Code(s):        --- Professional ---                           K22.9, Disease of esophagus, unspecified                           R13.10, Dysphagia, unspecified  K31.89, Other diseases of stomach and duodenum CPT copyright 2019 [employer]. All rights reserved. The codes documented in this report are preliminary and upon coder review may  be revised to meet current compliance requirements. Thornton Park MD, MD 09/16/2020 1:44:42 PM This report has been signed electronically. Number of Addenda: 0

## 2020-09-16 NOTE — Discharge Summary (Signed)
Physician Discharge Summary  Duane Cuevas DPO:[id number] DOB: 06/10/1956 DOA: 09/13/2020  PCP: [name], MD  Admit date: 09/13/2020 Discharge date: 09/16/2020  Admitted From: Home Discharge disposition: Home   Code Status: Full Code  Diet Recommendation: Cardiac diet  Discharge Diagnosis:   Principal Problem:   Chest pain Active Problems:   CAD (coronary artery disease)   Acute exacerbation of chronic obstructive pulmonary disease (COPD) ([place])   Hyperglycemia   Uncontrolled type 2 diabetes mellitus with hyperglycemia ([place])   Other dysphagia   Acute esophagitis   Acute gastric erosion   Chief Complaint  Patient presents with  . Chest Pain  . Shortness of Breath   Brief narrative: Duane Cuevas is a 65 y.o. male with PMH significant for DM2, HTN, COPD, current everyday smoker. Patient presented to the ED on 3/13 with complaint of chest pain for 5 days 3/10, patient had been to the ED with shortness of breath and chest pain and was discharged home with a few days of prednisone 20 mg daily.  He felt fine at first but chest pain again recurred in 24 hours accompanied by epigastric pain, some nausea and vomiting and hence he returned back to ED. In the ED, patient was afebrile, heart rate was 115, blood pressure stable.  Breathing on room air. Blood work with blood sugar level 702, creatinine 1.26, serum osmolality elevated to 327, CBC unremarkable, troponin negative, D-dimer normal Chest x-ray unremarkable with some chronic emphysematous changes EKG with normal sinus rhythm. Patient was admitted to hospital medicine service  Subjective: Patient was seen and examined this morning.  Lying on bed.  Not in distress.  Underwent EGD this afternoon, found to have esophageal candidiasis.  Hospital course: Chest pain Right upper quadrant/epigastric tenderness -Presented with chest pain, nausea, vomiting.  On examination patient has precordial tenderness.   -Cardiac and noncardiac etiologies were considered.   Troponin normal x3.  EKG without significant ST-T wave changes. -Per admission H&P, stress test last year was normal. -Cardiology consult was obtained.  Cardiac CTA was unremarkable. -HIDA scan was negative which ruled out gallbladder pathology.  Esophageal candidiasis -Based on his symptom profile, patient could be having reflux/esophageal dysphagia issues.  GI consultation called.  Underwent EGD today 3/16.  EGD showed esophageal plaques consistent with candidiasis, likely because of dysphagia.  No stigmata of bleeding in the esophagus or stomach or duodenum. -Able to tolerate diet postprocedure. -Per GI recommendation, patient was given Diflucan 200 mg 1 time today and discharged on Diflucan 100 mg daily for next 9 days.   -He will continue Protonix 40 mg daily.  -Avoid all NSAIDs. -Pending pathology report.  Follow-up with GI as an outpatient.  Hyperosmolar hyperglycemic nonketotic state -Initial blood glucose level over 700, anion gap 30 elevated to 16, pH normal, serum osmolality elevated to 327 -Started on IV fluid, infusion drip and serial metabolic panel.  Gradually switch to subcu insulin with Lantus and sliding scale.  Uncontrolled type 2 diabetes mellitus -A1c 10.5 on 08/21/2020 -Home meds include glipizide 10 mg twice daily daily, Metformin 1000 mg twice daily -Because of elevated A1c, patient needs to be on insulin treatment.   -We will discharge him on Lantus 10 units daily, NovoLog 3 units 3 times daily Premeal, glipizide 10 mg twice daily and Metformin 1000 mg daily. Recent Labs  Lab 09/15/20 1659 09/15/20 2053 09/16/20 0748 09/16/20 1157 09/16/20 1347  GLUCAP 157* 169* 114* 98 77   Essential hypertension  -Controlled on amlodipine.  COPD Current everyday smoker -COPD stable.  Continue bronchodilators.  Counseled to quit smoking.   Hyperlipidemia  -on statins.   Wound care: Incision (Closed) 05/15/18  Groin Left (Active)  Date First Assessed/Time First Assessed: 05/15/18 1134   Location: Groin  Location Orientation: Left    Assessments 05/15/2018 12:27 PM  Dressing Type Liquid skin adhesive  Dressing Clean;Dry;Intact  Margins Attached edges (approximated)  Closure Skin glue  Drainage Amount None     No Linked orders to display    Discharge Exam:   Vitals:   09/16/20 1342 09/16/20 1349 09/16/20 1350 09/16/20 1400  BP: (!) 89/56 107/67 96/65 115/60  Pulse: 79 75 77 70  Resp: 18 15 18 18   Temp: 97.6 F (36.4 C)     TempSrc: Axillary     SpO2: 99% 99% 98% 97%  Weight:      Height:        Body mass index is 24.94 kg/m.  General exam: Pleasant, elderly Hispanic male.  Not in distress Skin: No rashes, lesions or ulcers. HEENT: Atraumatic, normocephalic, no obvious bleeding Lungs: Clear to auscultation bilaterally CVS: Regular rate and rhythm, no murmur GI/Abd soft, nontender, nondistended, bowel sound present CNS: Alert, awake, oriented x3 Psychiatry: Mood appropriate Extremities: No pedal edema, no calf tenderness  Follow ups:   Discharge Instructions    Ambulatory referral to Nutrition and Diabetic Education   Complete by: As directed    Diet - low sodium heart healthy   Complete by: As directed    Diet Carb Modified   Complete by: As directed    Increase activity slowly   Complete by: As directed       Follow-up Information    [name], MD Follow up.   Specialty: Internal Medicine Contact information: [place] [address] [phone]               Recommendations for Outpatient Follow-Up:   1. Follow-up with PCP as an outpatient 2. Follow-up with GI as an outpatient  Discharge Instructions:  Follow with Primary MD [name], MD in 7 days   Get CBC/BMP checked in next visit within 1 week by PCP or SNF MD ( we routinely change or add medications that can affect your baseline labs and fluid status,  therefore we recommend that you get the mentioned basic workup next visit with your PCP, your PCP may decide not to get them or add new tests based on their clinical decision)  On your next visit with your PCP, please Get Medicines reviewed and adjusted.  Please request your PCP  to go over all Hospital Tests and Procedure/Radiological results at the follow up, please get all Hospital records sent to your Prim MD by signing hospital release before you go home.  Activity: As tolerated with Full fall precautions use walker/cane & assistance as needed  For Heart failure patients - Check your Weight same time everyday, if you gain over 2 pounds, or you develop in leg swelling, experience more shortness of breath or chest pain, call your Primary MD immediately. Follow Cardiac Low Salt Diet and 1.5 lit/day fluid restriction.  If you have smoked or chewed Tobacco in the last 2 yrs please stop smoking, stop any regular Alcohol  and or any Recreational drug use.  If you experience worsening of your admission symptoms, develop shortness of breath, life threatening emergency, suicidal or homicidal thoughts you must seek medical attention immediately by calling 911 or calling your MD  immediately  if symptoms less severe.  You Must read complete instructions/literature along with all the possible adverse reactions/side effects for all the Medicines you take and that have been prescribed to you. Take any new Medicines after you have completely understood and accpet all the possible adverse reactions/side effects.   Do not drive, operate heavy machinery, perform activities at heights, swimming or participation in water activities or provide baby sitting services if your were admitted for syncope or siezures until you have seen by Primary MD or a Neurologist and advised to do so again.  Do not drive when taking Pain medications.  Do not take more than prescribed Pain, Sleep and Anxiety Medications  Wear Seat  belts while driving.   Please note You were cared for by a hospitalist during your hospital stay. If you have any questions about your discharge medications or the care you received while you were in the hospital after you are discharged, you can call the unit and asked to speak with the hospitalist on call if the hospitalist that took care of you is not available. Once you are discharged, your primary care physician will handle any further medical issues. Please note that NO REFILLS for any discharge medications will be authorized once you are discharged, as it is imperative that you return to your primary care physician (or establish a relationship with a primary care physician if you do not have one) for your aftercare needs so that they can reassess your need for medications and monitor your lab values.    Allergies as of 09/16/2020   No Known Allergies     Medication List    STOP taking these medications   predniSONE 20 MG tablet Commonly known as: DELTASONE     TAKE these medications   acetaminophen 500 MG tablet Commonly known as: TYLENOL Take 1,000 mg by mouth every 6 (six) hours as needed for moderate pain or headache.   albuterol (2.5 MG/3ML) 0.083% nebulizer solution Commonly known as: PROVENTIL TAKE 3 ML(2.5 MG TOTAL) BY NEBULIZATION EVERY 6(SIX) HOURS AS NEEDED What changed: See the new instructions.   alum & mag hydroxide-simeth 200-200-20 MG/5ML suspension Commonly known as: MAALOX/MYLANTA Take 15 mLs by mouth every 6 (six) hours as needed for up to 7 days for indigestion or heartburn.   amLODipine 5 MG tablet Commonly known as: NORVASC Take 1 tablet (5 mg total) by mouth daily.   aspirin 81 MG EC tablet Take 1 tablet (81 mg total) by mouth daily.   atorvastatin 20 MG tablet Commonly known as: LIPITOR Take 1 tablet (20 mg total) by mouth daily.   budesonide-formoterol 160-4.5 MCG/ACT inhaler Commonly known as: Symbicort Inhale 2 puffs into the lungs 2 (two)  times daily.   fluconazole 100 MG tablet Commonly known as: DIFLUCAN Take 1 tablet (100 mg total) by mouth daily for 9 days. Start taking on: September 17, 2020   glipiZIDE 10 MG tablet Commonly known as: GLUCOTROL Take 1 tablet (10 mg total) by mouth 2 (two) times daily before a meal.   insulin glargine 100 UNIT/ML Solostar Pen Commonly known as: LANTUS Inject 10 Units into the skin daily.   insulin lispro 100 UNIT/ML KwikPen Commonly known as: HUMALOG Inject 3 Units into the skin 3 (three) times daily.   meclizine 25 MG tablet Commonly known as: ANTIVERT TAKE ONE TABLET BY MOUTH THREE TIMES A DAY AS NEEDED FOR DIZZINESS What changed: See the new instructions.   metFORMIN 1000 MG tablet Commonly known as:  GLUCOPHAGE Take 1 tablet (1,000 mg total) by mouth 2 (two) times daily with a meal.   pantoprazole 40 MG tablet Commonly known as: Protonix Take 1 tablet (40 mg total) by mouth 2 (two) times daily before a meal.       Time coordinating discharge: 35 minutes  The results of significant diagnostics from this hospitalization (including imaging, microbiology, ancillary and laboratory) are listed below for reference.    Procedures and Diagnostic Studies:   DG Chest 2 View  Result Date: 09/13/2020 CLINICAL DATA:  Chest pain EXAM: CHEST - 2 VIEW COMPARISON:  09/10/2020, CT 01/22/2020 FINDINGS: Emphysematous disease. No focal opacity or pleural effusion. Stable cardiomediastinal silhouette. No pneumothorax. Calcified granuloma left lung base. IMPRESSION: No acute changes compared to prior.  Emphysematous disease Electronically Signed   By: Donavan Foil M.D.   On: 09/13/2020 20:17   NM Hepatobiliary Liver Func  Result Date: 09/14/2020 CLINICAL DATA:  Abdominal pain. Concern for acute cholecystitis. Gallstones on ultrasound exam EXAM: NUCLEAR MEDICINE HEPATOBILIARY IMAGING TECHNIQUE: Sequential images of the abdomen were obtained out to 60 minutes following intravenous  administration of radiopharmaceutical. RADIOPHARMACEUTICALS:  5.1 mCi Tc-61m  Choletec IV COMPARISON:  Ultrasound 09/14/2020 FINDINGS: Prompt clearance radiotracer from blood pool and homogeneous uptake in liver. Counts are evident in the common bile duct and small bowel by 20 minutes. The gallbladder fails to fill at 1 hour. 30 mg of IV morphine was administered to augment filling of the gallbladder. The gallbladder filled with biliary excreted radiotracer after administration of morphine. IMPRESSION: 1. Visualization of the gallbladder indicates patent cystic duct and IN CONSISTENT with acute cholecystitis. 2. Patent common bile duct. Electronically Signed   By: Suzy Bouchard M.D.   On: 09/14/2020 15:21   US Abdomen Complete  Result Date: 09/14/2020 CLINICAL DATA:  Abdominal pain for 1 day.  Diabetes. EXAM: ABDOMEN ULTRASOUND COMPLETE COMPARISON:  CT abdomen pelvis 09/23/2015 FINDINGS: Gallbladder: Calcified stones noted within the gallbladder lumen. No gallbladder wall thickening visualized. No pericholecystic fluid. No sonographic Murphy sign noted by sonographer. Common bile duct: Diameter: 8 mm.  Stable upper limits of normal. Liver: No focal lesion identified. Within normal limits in parenchymal echogenicity. Portal vein is patent on color Doppler imaging with normal direction of blood flow towards the liver. IVC: No abnormality visualized. Pancreas: Visualized portion unremarkable. Spleen: Size and appearance within normal limits. Right Kidney: Length: 10.8 cm. Echogenicity within normal limits. No mass or hydronephrosis visualized. Left Kidney: Length: 10.8 cm. Echogenicity within normal limits. No mass or hydronephrosis visualized. Abdominal aorta: No aneurysm visualized. Other findings: None. IMPRESSION: Cholelithiasis with no acute cholecystitis. Electronically Signed   By: Iven Finn M.D.   On: 09/14/2020 02:13     Labs:   Basic Metabolic Panel: Recent Labs  Lab 09/14/20 0305  09/14/20 0748 09/14/20 1037 09/15/20 0225 09/16/20 0235  NA 140 138 141 136 139  K 3.2* 3.5 3.4* 3.8 3.8  CL 110 103 107 101 105  CO2 24 21* 25 26 26   GLUCOSE 243* 250* 158* 230* 127*  BUN 25* 24* 22 19 16   CREATININE 0.80 0.80 0.65 0.80 0.85  CALCIUM 8.0* 9.0 9.3 8.6* 8.8*  MG  --   --   --  2.0  --   PHOS  --   --   --  2.8  --    GFR Estimated Creatinine Clearance: 83.8 mL/min (by C-G formula based on SCr of 0.85 mg/dL). Liver Function Tests: Recent Labs  Lab 09/10/20 1454 09/14/20  0224  AST 23 24  ALT 39 44  ALKPHOS 70 56  BILITOT 0.4 0.6  PROT 7.7 6.7  ALBUMIN 4.3 3.9   Recent Labs  Lab 09/10/20 1454 09/14/20 0224  LIPASE 33 27   No results for input(s): AMMONIA in the last 168 hours. Coagulation profile No results for input(s): INR, PROTIME in the last 168 hours.  CBC: Recent Labs  Lab 09/10/20 1304 09/13/20 1943 09/13/20 2305 09/15/20 0225 09/16/20 0235  WBC 10.0 10.2 9.8 12.3* 10.3  NEUTROABS  --   --   --  9.7* 6.7  HGB 16.7 15.7 14.9 14.2 15.4  HCT 50.6 46.7 44.1 41.7 46.3  MCV 94.4 94.5 94.6 92.9 94.1  PLT 244 232 204 191 199   Cardiac Enzymes: No results for input(s): CKTOTAL, CKMB, CKMBINDEX, TROPONINI in the last 168 hours. BNP: Invalid input(s): POCBNP CBG: Recent Labs  Lab 09/15/20 1659 09/15/20 2053 09/16/20 0748 09/16/20 1157 09/16/20 1347  GLUCAP 157* 169* 114* 98 77   D-Dimer Recent Labs    09/13/20 1947  DDIMER <0.27   Hgb A1c No results for input(s): HGBA1C in the last 72 hours. Lipid Profile No results for input(s): CHOL, HDL, LDLCALC, TRIG, CHOLHDL, LDLDIRECT in the last 72 hours. Thyroid function studies No results for input(s): TSH, T4TOTAL, T3FREE, THYROIDAB in the last 72 hours.  Invalid input(s): FREET3 Anemia work up No results for input(s): VITAMINB12, FOLATE, FERRITIN, TIBC, IRON, RETICCTPCT in the last 72 hours. Microbiology Recent Results (from the past 240 hour(s))  Resp Panel by RT-PCR (Flu A&B,  Covid) Nasopharyngeal Swab     Status: None   Collection Time: 09/10/20  3:55 PM   Specimen: Nasopharyngeal Swab; Nasopharyngeal(NP) swabs in vial transport medium  Result Value Ref Range Status   SARS Coronavirus 2 by RT PCR NEGATIVE NEGATIVE Final    Comment: (NOTE) SARS-CoV-2 target nucleic acids are NOT DETECTED.  The SARS-CoV-2 RNA is generally detectable in upper respiratory specimens during the acute phase of infection. The lowest concentration of SARS-CoV-2 viral copies this assay can detect is 138 copies/mL. A negative result does not preclude SARS-Cov-2 infection and should not be used as the sole basis for treatment or other patient management decisions. A negative result may occur with  improper specimen collection/handling, submission of specimen other than nasopharyngeal swab, presence of viral mutation(s) within the areas targeted by this assay, and inadequate number of viral copies(<138 copies/mL). A negative result must be combined with clinical observations, patient history, and epidemiological information. The expected result is Negative.  Fact Sheet for Patients:  EntrepreneurPulse.com.au  Fact Sheet for Healthcare Providers:  IncredibleEmployment.be  This test is no t yet approved or cleared by the [place] FDA and  has been authorized for detection and/or diagnosis of SARS-CoV-2 by FDA under an Emergency Use Authorization (EUA). This EUA will remain  in effect (meaning this test can be used) for the duration of the COVID-19 declaration under Section 564(b)(1) of the Act, 21 U.S.C.section 360bbb-3(b)(1), unless the authorization is terminated  or revoked sooner.       Influenza A by PCR NEGATIVE NEGATIVE Final   Influenza B by PCR NEGATIVE NEGATIVE Final    Comment: (NOTE) The Xpert Xpress SARS-CoV-2/FLU/RSV plus assay is intended as an aid in the diagnosis of influenza from Nasopharyngeal swab specimens and should  not be used as a sole basis for treatment. Nasal washings and aspirates are unacceptable for Xpert Xpress SARS-CoV-2/FLU/RSV testing.  Fact Sheet for Patients: EntrepreneurPulse.com.au  Fact  Sheet for Healthcare Providers: IncredibleEmployment.be  This test is not yet approved or cleared by the [place] and has been authorized for detection and/or diagnosis of SARS-CoV-2 by FDA under an Emergency Use Authorization (EUA). This EUA will remain in effect (meaning this test can be used) for the duration of the COVID-19 declaration under Section 564(b)(1) of the Act, 21 U.S.C. section 360bbb-3(b)(1), unless the authorization is terminated or revoked.  Performed at [facility], [place] [address], [place], [place] [address]   SARS CORONAVIRUS 2 (TAT 6-24 HRS) Nasopharyngeal Nasopharyngeal Swab     Status: None   Collection Time: 09/13/20 11:34 PM   Specimen: Nasopharyngeal Swab  Result Value Ref Range Status   SARS Coronavirus 2 NEGATIVE NEGATIVE Final    Comment: (NOTE) SARS-CoV-2 target nucleic acids are NOT DETECTED.  The SARS-CoV-2 RNA is generally detectable in upper and lower respiratory specimens during the acute phase of infection. Negative results do not preclude SARS-CoV-2 infection, do not rule out co-infections with other pathogens, and should not be used as the sole basis for treatment or other patient management decisions. Negative results must be combined with clinical observations, patient history, and epidemiological information. The expected result is Negative.  Fact Sheet for Patients: SugarRoll.be  Fact Sheet for Healthcare Providers: https://www.woods-mathews.com/  This test is not yet approved or cleared by the [place] FDA and  has been authorized for detection and/or diagnosis of SARS-CoV-2 by FDA under an Emergency Use Authorization (EUA). This EUA  will remain  in effect (meaning this test can be used) for the duration of the COVID-19 declaration under Se ction 564(b)(1) of the Act, 21 U.S.C. section 360bbb-3(b)(1), unless the authorization is terminated or revoked sooner.  Performed at [facility], [place] [address], [place], [place] [address]   MRSA PCR Screening     Status: None   Collection Time: 09/14/20 10:21 AM   Specimen: Nasal Mucosa; Nasopharyngeal  Result Value Ref Range Status   MRSA by PCR NEGATIVE NEGATIVE Final    Comment:        The GeneXpert MRSA Assay (FDA approved for NASAL specimens only), is one component of a comprehensive MRSA colonization surveillance program. It is not intended to diagnose MRSA infection nor to guide or monitor treatment for MRSA infections. Performed at [facility], [place] [address] [place], [place] [address]      Signed: Marlowe Aschoff Taylour Lietzke  [facility] 09/16/2020, 2:26 PM

## 2020-09-16 NOTE — Interval H&P Note (Signed)
History and Physical Interval Note:  09/16/2020 12:34 PM  Duane Cuevas  has presented today for surgery, with the diagnosis of Dysphagia, atypical chest pain.  The various methods of treatment have been discussed with the patient and family. After consideration of risks, benefits and other options for treatment, the patient has consented to  Procedure(s): ESOPHAGOGASTRODUODENOSCOPY (EGD) WITH PROPOFOL (N/A) as a surgical intervention.  The patient's history has been reviewed, patient examined, no change in status, stable for surgery.  I have reviewed the patient's chart and labs.  Questions were answered to the patient's satisfaction.     [place]

## 2020-09-16 NOTE — Anesthesia Postprocedure Evaluation (Signed)
Anesthesia Post Note  Patient: Duane Cuevas  Procedure(s) Performed: ESOPHAGOGASTRODUODENOSCOPY (EGD) WITH PROPOFOL (N/A ) BIOPSY     Patient location during evaluation: PACU Anesthesia Type: MAC Level of consciousness: awake and alert Pain management: pain level controlled Vital Signs Assessment: post-procedure vital signs reviewed and stable Respiratory status: spontaneous breathing, nonlabored ventilation, respiratory function stable and patient connected to nasal cannula oxygen Cardiovascular status: stable and blood pressure returned to baseline Postop Assessment: no apparent nausea or vomiting Anesthetic complications: no   No complications documented.  Last Vitals:  Vitals:   09/16/20 1350 09/16/20 1400  BP: 96/65 115/60  Pulse: 77 70  Resp: 18 18  Temp:    SpO2: 98% 97%    Last Pain:  Vitals:   09/16/20 1400  TempSrc:   PainSc: 0-No pain                 Texie Tupou

## 2020-09-16 NOTE — Transfer of Care (Signed)
Immediate Anesthesia Transfer of Care Note  Patient: Duane Cuevas  Procedure(s) Performed: ESOPHAGOGASTRODUODENOSCOPY (EGD) WITH PROPOFOL (N/A ) BIOPSY  Patient Location: Endoscopy Unit  Anesthesia Type:MAC  Level of Consciousness: awake, alert , oriented and patient cooperative  Airway & Oxygen Therapy: Patient Spontanous Breathing and Patient connected to face mask  Post-op Assessment: Report given to RN and Post -op Vital signs reviewed and stable  Post vital signs: Reviewed and stable  Last Vitals:  Vitals Value Taken Time  BP 89/56 09/16/20 1340  Temp    Pulse 73 09/16/20 1345  Resp 22 09/16/20 1345  SpO2 99 % 09/16/20 1345  Vitals shown include unvalidated device data.  Last Pain:  Vitals:   09/16/20 1205  TempSrc: Oral  PainSc: 0-No pain         Complications: No complications documented.

## 2020-09-17 ENCOUNTER — Encounter (HOSPITAL_COMMUNITY): Payer: Self-pay | Admitting: Gastroenterology

## 2020-09-17 LAB — SURGICAL PATHOLOGY

## 2020-09-18 ENCOUNTER — Encounter: Payer: Self-pay | Admitting: Nurse Practitioner

## 2020-09-18 ENCOUNTER — Other Ambulatory Visit: Payer: Self-pay

## 2020-09-18 NOTE — Patient Outreach (Signed)
[facility]) Care Management  09/18/2020  Duane Cuevas 1955-12-09 [id number]   Referral Date: 09/18/20 Referral Source: [employer] Report Date of Discharge: 09/16/20 Facility:  [place]: [facility]   Referral received.  No outreach warranted at this time.  Transition of Care calls being completed via EMMI. RN CM will outreach patient for any red flags received.    Plan: RN CM will close case.    Jone Baseman, RN, MSN [facility] Care Management Care Management Coordinator Direct Line [phone] Toll Free: [phone]  Fax: [phone]

## 2020-09-21 ENCOUNTER — Other Ambulatory Visit: Payer: Self-pay

## 2020-09-21 ENCOUNTER — Encounter: Payer: Self-pay | Admitting: Emergency Medicine

## 2020-09-21 ENCOUNTER — Ambulatory Visit (INDEPENDENT_AMBULATORY_CARE_PROVIDER_SITE_OTHER): Payer: Medicare HMO | Admitting: Emergency Medicine

## 2020-09-21 VITALS — BP 131/78 | HR 95 | Temp 98.8°F | Resp 16 | Ht 68.0 in | Wt 163.0 lb

## 2020-09-21 DIAGNOSIS — K209 Esophagitis, unspecified without bleeding: Secondary | ICD-10-CM | POA: Diagnosis not present

## 2020-09-21 DIAGNOSIS — E785 Hyperlipidemia, unspecified: Secondary | ICD-10-CM | POA: Diagnosis not present

## 2020-09-21 DIAGNOSIS — I152 Hypertension secondary to endocrine disorders: Secondary | ICD-10-CM | POA: Insufficient documentation

## 2020-09-21 DIAGNOSIS — E1165 Type 2 diabetes mellitus with hyperglycemia: Secondary | ICD-10-CM | POA: Diagnosis not present

## 2020-09-21 DIAGNOSIS — E1159 Type 2 diabetes mellitus with other circulatory complications: Secondary | ICD-10-CM | POA: Diagnosis not present

## 2020-09-21 DIAGNOSIS — Z23 Encounter for immunization: Secondary | ICD-10-CM

## 2020-09-21 DIAGNOSIS — F17210 Nicotine dependence, cigarettes, uncomplicated: Secondary | ICD-10-CM

## 2020-09-21 DIAGNOSIS — I700 Atherosclerosis of aorta: Secondary | ICD-10-CM | POA: Diagnosis not present

## 2020-09-21 DIAGNOSIS — I10 Essential (primary) hypertension: Secondary | ICD-10-CM | POA: Diagnosis not present

## 2020-09-21 DIAGNOSIS — J449 Chronic obstructive pulmonary disease, unspecified: Secondary | ICD-10-CM | POA: Diagnosis not present

## 2020-09-21 LAB — GLUCOSE, POCT (MANUAL RESULT ENTRY): POC Glucose: 234 mg/dl — AB (ref 70–99)

## 2020-09-21 LAB — POCT GLYCOSYLATED HEMOGLOBIN (HGB A1C): Hemoglobin A1C: 11.6 % — AB (ref 4.0–5.6)

## 2020-09-21 MED ORDER — BLOOD GLUCOSE MONITOR KIT: PACK | 0 refills | Status: DC

## 2020-09-21 MED ORDER — INSULIN GLARGINE 100 UNIT/ML SOLOSTAR PEN
20.0000 [IU] | PEN_INJECTOR | Freq: Every day | SUBCUTANEOUS | 3 refills | Status: DC
Start: 2020-09-21 — End: 2020-11-05

## 2020-09-21 MED ORDER — INSULIN LISPRO (1 UNIT DIAL) 100 UNIT/ML (KWIKPEN): 3.0000 [IU] | PEN_INJECTOR | Freq: Three times a day (TID) | SUBCUTANEOUS | 3 refills | Status: DC

## 2020-09-21 NOTE — Addendum Note (Signed)
Addended by: Alfredia Ferguson A on: 09/21/2020 05:38 PM   Modules accepted: Orders

## 2020-09-21 NOTE — Addendum Note (Signed)
Addended by: Alfredia Ferguson A on: 09/21/2020 12:40 PM   Modules accepted: Orders

## 2020-09-21 NOTE — Progress Notes (Signed)
Duane Cuevas 65 y.o.   Chief Complaint  Patient presents with  . Diabetes   Physician Discharge Summary  Duane Cuevas EXN:[id number] DOB: Oct 27, 1955 DOA: 09/13/2020  PCP: [name], MD  Admit date: 09/13/2020 Discharge date: 09/16/2020  Admitted From: Home Discharge disposition: Home   Code Status: Full Code  Diet Recommendation: Cardiac diet  Discharge Diagnosis:   Principal Problem:   Chest pain Active Problems:   CAD (coronary artery disease)   Acute exacerbation of chronic obstructive pulmonary disease (COPD) ([place])   Hyperglycemia   Uncontrolled type 2 diabetes mellitus with hyperglycemia ([place])   Other dysphagia   Acute esophagitis   Acute gastric erosion  Hospital course: Chest pain Right upper quadrant/epigastric tenderness -Presented with chest pain, nausea, vomiting.  On examination patient has precordial tenderness.  -Cardiac and noncardiac etiologies were considered.   Troponin normal x3.  EKG without significant ST-T wave changes. -Per admission H&P, stress test last year was normal. -Cardiology consult was obtained.  Cardiac CTA was unremarkable. -HIDA scan was negative which ruled out gallbladder pathology.  Esophageal candidiasis -Based on his symptom profile, patient could be having reflux/esophageal dysphagia issues.  GI consultation called.  Underwent EGD today 3/16.  EGD showed esophageal plaques consistent with candidiasis, likely because of dysphagia.  No stigmata of bleeding in the esophagus or stomach or duodenum. -Able to tolerate diet postprocedure. -Per GI recommendation, patient was given Diflucan 200 mg 1 time today and discharged on Diflucan 100 mg daily for next 9 days.   -He will continue Protonix 40 mg daily.  -Avoid all NSAIDs. -Pending pathology report.  Follow-up with GI as an outpatient.  Hyperosmolar hyperglycemic nonketotic state -Initial blood glucose level over 700, anion gap 30 elevated to 16, pH  normal, serum osmolality elevated to 327 -Started on IV fluid, infusion drip and serial metabolic panel.  Gradually switch to subcu insulin with Lantus and sliding scale.  Uncontrolled type 2 diabetes mellitus -A1c 10.5 on 08/21/2020 -Home meds include glipizide 10 mg twice daily daily, Metformin 1000 mg twice daily -Because of elevated A1c, patient needs to be on insulin treatment.   -We will discharge him on Lantus 10 units daily, NovoLog 3 units 3 times daily Premeal, glipizide 10 mg twice daily and Metformin 1000 mg daily. Last Labs          Recent Labs  Lab 09/15/20 1659 09/15/20 2053 09/16/20 0748 09/16/20 1157 09/16/20 1347  GLUCAP 157* 169* 114* 98 77     Essential hypertension  -Controlled on amlodipine.    COPD Current everyday smoker -COPD stable.  Continue bronchodilators.  Counseled to quit smoking.   Hyperlipidemia  -on statins.   HISTORY OF PRESENT ILLNESS: This is a 65 y.o. male here for follow-up of diabetes. Was recently in the hospital admitted on 09/13/2020 and discharged on 09/16/2020 when he presented with atypical chest pain and uncontrolled diabetes. Upper endoscopy showed esophagitis secondary to Candida.  On Diflucan and Protonix. Coronary CT scan showed coronary score of 0.  It also showed aortic atherosclerosis. Patient has history of advanced COPD.  No longer smoking.  Takes daily Symbicort. Presently on glipizide 10 mg twice a day and Metformin 1000 mg twice a day.  Started on insulin. Presently taking Lantus 10 units daily and premeal Humalog 3 units. Sugars at home: AM: 120-160.  P.m.: Over 300s.  On atorvastatin 20 mg daily. No other complaints or medical concerns today.  HPI   Prior to Admission medications   Medication Sig Start  Date End Date Taking? Authorizing Provider  acetaminophen (TYLENOL) 500 MG tablet Take 1,000 mg by mouth every 6 (six) hours as needed for moderate pain or headache.   Yes [provider]  albuterol  (PROVENTIL) (2.5 MG/3ML) 0.083% nebulizer solution TAKE 3 ML(2.5 MG TOTAL) BY NEBULIZATION EVERY 6(SIX) HOURS AS NEEDED Patient taking differently: Take 2.5 mg by nebulization every 6 (six) hours as needed for wheezing or shortness of breath. 08/05/20  Yes Shaniya Tashiro, Ines Bloomer, MD  alum & mag hydroxide-simeth (MAALOX/MYLANTA) 200-200-20 MG/5ML suspension Take 15 mLs by mouth every 6 (six) hours as needed for up to 7 days for indigestion or heartburn. 09/16/20 09/23/20 Yes Dahal, Marlowe Aschoff, MD  amLODipine (NORVASC) 5 MG tablet Take 1 tablet (5 mg total) by mouth daily. 08/21/20  Yes Maximiano Coss, NP  aspirin EC 81 MG EC tablet Take 1 tablet (81 mg total) by mouth daily. 11/03/19  Yes Annita Brod, MD  atorvastatin (LIPITOR) 20 MG tablet Take 1 tablet (20 mg total) by mouth daily. 06/22/20  Yes Shaw Dobek, Ines Bloomer, MD  budesonide-formoterol [facility]) 160-4.5 MCG/ACT inhaler Inhale 2 puffs into the lungs 2 (two) times daily. 06/22/20  Yes Islam Villescas, Ines Bloomer, MD  fluconazole (DIFLUCAN) 100 MG tablet Take 1 tablet (100 mg total) by mouth daily for 9 days. 09/17/20 09/26/20 Yes Dahal, Marlowe Aschoff, MD  glipiZIDE (GLUCOTROL) 10 MG tablet Take 1 tablet (10 mg total) by mouth 2 (two) times daily before a meal. 08/21/20  Yes Maximiano Coss, NP  insulin glargine (LANTUS) 100 UNIT/ML Solostar Pen Inject 10 Units into the skin daily. 09/16/20 12/15/20 Yes Dahal, Marlowe Aschoff, MD  insulin lispro (HUMALOG) 100 UNIT/ML KwikPen Inject 3 Units into the skin 3 (three) times daily. 09/16/20 12/15/20 Yes Dahal, Marlowe Aschoff, MD  pantoprazole (PROTONIX) 40 MG tablet Take 1 tablet (40 mg total) by mouth 2 (two) times daily before a meal. 09/16/20 12/15/20 Yes Dahal, Marlowe Aschoff, MD  meclizine (ANTIVERT) 25 MG tablet TAKE ONE TABLET BY MOUTH THREE TIMES A DAY AS NEEDED FOR DIZZINESS Patient not taking: Reported on 09/21/2020 07/31/20   [name], MD  metFORMIN (GLUCOPHAGE) 1000 MG tablet Take 1 tablet (1,000 mg total) by mouth 2 (two)  times daily with a meal. 06/22/20 09/20/20  [name], MD    No Known Allergies  Patient Active Problem List   Diagnosis Date Noted  . Acute esophagitis   . Acute gastric erosion   . Other dysphagia   . Hyperglycemia 09/13/2020  . Uncontrolled type 2 diabetes mellitus with hyperglycemia ([place]) 09/13/2020  . Musculoskeletal chest pain 04/29/2020  . COPD exacerbation ([place]) 04/28/2020  . Acute exacerbation of chronic obstructive pulmonary disease (COPD) ([place]) 04/27/2020  . CAD (coronary artery disease) 01/23/2020  . Hyperlipidemia 11/02/2019  . History of MI (myocardial infarction) 11/02/2019  . Chest pain 11/01/2019  . Cigarette smoker 08/15/2018  . COPD ? GOLD III/ active smoker 08/14/2018  . History of diet-controlled diabetes 06/30/2018  . COPD (chronic obstructive pulmonary disease) ([place]) 10/14/2014  . Type 2 diabetes mellitus with hyperglycemia, without long-term current use of insulin ([place]) 10/14/2014    Past Medical History:  Diagnosis Date  . Diabetes mellitus ([employer]) 10/2014   pt denies being diabetic.   . Emphysema of lung ([name])   . Emphysema/COPD ([place]) 10/2014  . Hepatic steatosis   . Thrombocytopenia ([place]) 09/2015   platelets in 120s.     Past Surgical History:  Procedure Laterality Date  . BIOPSY  09/16/2020   Procedure: BIOPSY;  Surgeon: Thornton Park, MD;  Location: [facility];  Service: Gastroenterology;;  . ESOPHAGOGASTRODUODENOSCOPY N/A 09/23/2015   Procedure: ESOPHAGOGASTRODUODENOSCOPY (EGD);  Surgeon: Ladene Artist, MD;  Location: [facility];  Service: Endoscopy;  Laterality: N/A;  . ESOPHAGOGASTRODUODENOSCOPY (EGD) WITH PROPOFOL N/A 09/16/2020   Procedure: ESOPHAGOGASTRODUODENOSCOPY (EGD) WITH PROPOFOL;  Surgeon: Thornton Park, MD;  Location: [facility];  Service: Gastroenterology;  Laterality: N/A;  . FOOT SURGERY    . INGUINAL HERNIA REPAIR Left 05/15/2018   Procedure: LEFT INGUINAL HERNIA REPAIR WITH MESH;  Surgeon: Erroll Luna, MD;  Location: [place];  Service: General;  Laterality: Left;  . INSERTION OF MESH Left 05/15/2018   Procedure: INSERTION OF MESH;  Surgeon: Erroll Luna, MD;  Location: [place];  Service: General;  Laterality: Left;  . UPPER GASTROINTESTINAL ENDOSCOPY      Social History   Socioeconomic History  . Marital status: Married    Spouse name: Ramonita Lab  . Number of children: 6  . Years of education: Not on file  . Highest education level: Not on file  Occupational History  . Occupation: Employed    Employer: [employer]  Tobacco Use  . Smoking status: Current Every Day Smoker    Packs/day: 0.15    Years: 47.00    Pack years: 7.05    Types: Cigarettes  . Smokeless tobacco: Never Used  Vaping Use  . Vaping Use: Former  Substance and Sexual Activity  . Alcohol use: Yes    Comment: occ  . Drug use: No  . Sexual activity: Never  Other Topics Concern  . Not on file  Social History Narrative   Lives with wife.  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   Social Determinants of Health   Financial Resource Strain: Not on file  Food Insecurity: Not on file  Transportation Needs: Not on file  Physical Activity: Not on file  Stress: Not on file  Social Connections: Not on file  Intimate Partner Violence: Not on file    Family History  Problem Relation Age of Onset  . Emphysema Mother   . Diabetes Mother   . Emphysema Father   . Cancer Sister        Stomach cancer  . Diabetes Sister   . Stomach cancer Sister   . Diabetes Brother   . Colon cancer Neg Hx   . Colon polyps Neg Hx   . Esophageal cancer Neg Hx   . Rectal cancer Neg Hx      Review of Systems  Constitutional: Negative.  Negative for chills and fever.  HENT: Negative.  Negative for congestion and sore throat.   Respiratory: Negative.  Negative for cough and shortness of breath.   Cardiovascular: Negative.  Negative for chest pain and palpitations.  Gastrointestinal: Negative for abdominal  pain, diarrhea, nausea and vomiting.  Genitourinary: Negative.  Negative for dysuria and hematuria.  Skin: Negative.  Negative for rash.  Neurological: Negative.  Negative for dizziness and headaches.  All other systems reviewed and are negative.  Today's Vitals   09/21/20 0955  BP: 131/78  Pulse: 95  Resp: 16  Temp: 98.8 F (37.1 C)  TempSrc: Temporal  SpO2: 96%  Weight: 163 lb (73.9 kg)  Height: $Remove'5\' 8"'alaJpKP$  (1.727 m)   Body mass index is 24.78 kg/m. Wt Readings from Last 3 Encounters:  09/21/20 163 lb (73.9 kg)  09/16/20 164 lb 0.4 oz (74.4 kg)  09/10/20 168 lb (76.2 kg)     Physical Exam  Vitals reviewed.  Constitutional:      Appearance: Normal appearance.  HENT:     Head: Normocephalic.  Eyes:     Extraocular Movements: Extraocular movements intact.     Pupils: Pupils are equal, round, and reactive to light.  Cardiovascular:     Rate and Rhythm: Normal rate and regular rhythm.     Pulses: Normal pulses.     Heart sounds: Normal heart sounds.  Pulmonary:     Effort: Pulmonary effort is normal.     Breath sounds: Normal breath sounds.  Chest:     Chest wall: Tenderness (Tender manubrium of the sternum) present.  Abdominal:     General: There is no distension.     Palpations: Abdomen is soft.     Tenderness: There is no abdominal tenderness.  Musculoskeletal:        General: Normal range of motion.     Cervical back: Normal range of motion and neck supple.  Skin:    General: Skin is warm and dry.     Capillary Refill: Capillary refill takes less than 2 seconds.  Neurological:     General: No focal deficit present.     Mental Status: He is alert and oriented to person, place, and time.  Psychiatric:        Mood and Affect: Mood normal.        Behavior: Behavior normal.    Results for orders placed or performed in visit on 09/21/20 (from the past 24 hour(s))  POCT glucose (manual entry)     Status: Abnormal   Collection Time: 09/21/20 10:52 AM  Result Value  Ref Range   POC Glucose 234 (A) 70 - 99 mg/dl  POCT glycosylated hemoglobin (Hb A1C)     Status: Abnormal   Collection Time: 09/21/20 10:58 AM  Result Value Ref Range   Hemoglobin A1C 11.6 (A) 4.0 - 5.6 %   HbA1c POC (<> result, manual entry)     HbA1c, POC (prediabetic range)     HbA1c, POC (controlled diabetic range)     A total of 45 minutes was spent with the patient, greater than 50% of which was in counseling/coordination of care regarding multiple chronic medical problems including diabetes and cardiovascular risks associated with this condition, review of all medications and changes made, review of most recent blood work results including today's hemoglobin A1c, education on nutrition, review of most recent hospital admission notes, review of most recent office visit notes, review of most recent upper endoscopy report, review of most recent coronary CAT scan report, review of most recent echocardiogram results, prognosis, documentation and need for follow-up.   ASSESSMENT & PLAN: Type 2 diabetes mellitus with hyperglycemia, without long-term current use of insulin (HCC) Uncontrolled diabetes with hemoglobin A1c at 11.6. Continue glipizide and Metformin. Increase Lantus insulin to 20 units daily. Premeal lispro insulin as per sliding scale. Continue monitoring blood glucose levels at home. Hypoglycemia instructions given. Diet and nutrition discussed. Follow-up in 3 months.  Essential hypertension Well-controlled hypertension.  Continue amlodipine 5 mg daily and daily baby aspirin.  [place] was seen today for diabetes.  Diagnoses and all orders for this visit:  Type 2 diabetes mellitus with hyperglycemia, without long-term current use of insulin (HCC) -     POCT glucose (manual entry) -     POCT glycosylated hemoglobin (Hb A1C) -     insulin glargine (LANTUS) 100 UNIT/ML Solostar Pen; Inject 20 Units into the skin daily. -     insulin  lispro (HUMALOG) 100 UNIT/ML KwikPen;  Inject 3 Units into the skin 3 (three) times daily. Adjust amount of insulin as per sliding scale. -     blood glucose meter kit and supplies KIT; Dispense based on patient and insurance preference. Use up to four times daily as directed.  Essential hypertension  Chronic obstructive pulmonary disease, unspecified COPD type ([place])  Acute esophagitis  Cigarette smoker  Dyslipidemia  Aortic atherosclerosis ([place])  Hypertension associated with diabetes [facility])    Patient Instructions   Continua Metformin and glipizide. Aumenta daily Lantus insulin to 20 units. Premeal insulin as per sliding scale. Follow-up in 3 months.  Prevencin de la hipoglucemia Preventing Hypoglycemia La hipoglucemia ocurre cuando el nivel de azcar (glucosa) en la sangre es demasiado bajo. La hipoglucemia puede suceder en personas con o sin diabetes (diabetes mellitus). Puede desarrollarse rpidamente y ser una emergencia mdica. En [place] con diabetes, un nivel de glucemia inferior a 70 mg/dl (3,35mmol/l) se considera hipoglucemia. La glucosa es un tipo de azcar que constituye la principal fuente de energa del cuerpo. Determinadas hormonas, como la insulina y Secretary/administrator, Probation officer el nivel de glucosa en la [place]. La insulina baja la glucemia, mientras que el glucagn la [place]. Si tiene demasiada insulina en el torrente sanguneo o si no ingiere la cantidad suficiente de alimentos que contengan glucosa, puede tener hipoglucemia. Su riesgo de Best boy hipoglucemia es mayor:  Si [place] insulina o medicamentos para la diabetes para ayudar a Therapist, nutritional de glucemia o ayudar a que el cuerpo produzca ms insulina.  Si omite o demora una comida o refrigerio.  Si est enfermo.  Durante y despus de hacer ejercicio. Puede prevenir la hipoglucemia si trabaja con su mdico para ajustar su plan de comida segn sea necesario y si toma otras precauciones. Cmo me puede afectar la  hipoglucemia? Sntomas leves Es posible que la hipoglucemia leve no cause sntomas. Si tiene sntomas, pueden incluir los siguientes:  [place].  Ansiedad.  Sudoracin y piel hmeda.  Mareos o sensacin de desvanecimiento.  Somnolencia.  Nuseas.  Aumento de la frecuencia cardaca.  Dolor de [place].  Visin borrosa.  Irritabilidad.  Hormigueo o adormecimiento alrededor de la boca, labios o lengua.  Cambios en la coordinacin.  Sueo agitado. Si la hipoglucemia leve no se reconoce y trata, rpidamente puede convertirse en hipoglucemia moderada o grave. Sntomas moderados La hipoglucemia moderada puede causar los siguientes sntomas:  Confusin mental y deterioro del sentido de la realidad.  Cambios en el comportamiento.  Debilidad.  Latidos cardacos irregulares. Sntomas graves La hipoglucemia grave es una emergencia mdica. Puede ocasionar:  Desmayos.  Convulsiones.  Prdida de la conciencia (estado de coma).  Muerte. Qu cambios en la alimentacin se pueden realizar?  Trabaje junto con el mdico o con un especialista en alimentacin y nutricin (nutricionista) a fin de crear un plan de alimentacin que sea adecuado para usted. Cumpla con su plan de alimentacin de manera minuciosa.  Coma comidas a horarios regulares.  Si se lo recomienda el mdico, coma refrigerios [employer].  No omita ni demore comidas o refrigerios. Puede correr riesgo de sufrir hipoglucemia si no recibe una cantidad suficiente de hidratos de carbono. Qu cambios en el estilo de vida se pueden realizar?  Trabaje conjuntamente con su mdico para controlar la glucemia. Asegrese de saber lo siguiente: ? Sus niveles objetivo de glucemia. ? Cmo y cundo controlar su nivel de glucemia. ? Los sntomas de la hipoglucemia. Es importante tratarla  de inmediato para evitar que se agrave.  No beba alcohol con el estmago vaco.  Cuando est enfermo, controle su nivel de glucemia  con ms frecuencia que la habitual. Siga el plan para los das de enfermedad cuando no pueda comer ni beber normalmente. Elabore este plan por adelantado con el mdico.  Controle su nivel de glucemia antes, durante y despus de hacer [place].   Cmo se trata? A menudo, el tratamiento de esta afeccin consiste en ingerir de inmediato un alimento o una bebida que contenga azcar, por ejemplo:  De 4 a 6onzas (de 120 a 179ml) de jugo de frutas.  De 4 a 6onzas (de 120 a 149ml) de refresco comn (no diettico).  4 onzas (133ml) de [employer].  Varios caramelos duros.  1 cucharada (71ml) de azcar o miel. Tratamiento de la hipoglucemia en las personas con diabetes Si est alerta y puede tragar de Geographical information systems officer segura, siga la regla 15/15, que consiste en lo siguiente:  Consuma 15gramos de hidratos de carbono de accin rpida. Hable con su mdico acerca de cunto debera consumir.  Las opciones de accin rpida incluyen: ? Pastillas de glucosa (consuma 15 gramos). ? De 6 a 8unidades de caramelos duros. ? De 4 a 6onzas (de 120 a 180ml) de jugo de frutas. ? De 4 a 6onzas (de 120 a 145ml) de refresco comn (no diettico).  Contrlese la glucemia 61minutos despus de ingerir el hidrato de carbono.  Si este nuevo nivel de glucemia todava es igual o menor que $RemoveB'70mg'JZFrSiPL$ /dl (3,33mmol/l), ingiera nuevamente 15gramos de un hidrato de carbono.  Si el nivel de glucemia no supera los $RemoveBe'70mg'tYlCFfNSD$ /dl (3,74mmol/l) despus de 3intentos, solicite ayuda de inmediato.  Ingiera una comida o una colacin en el transcurso de 1hora despus de que su nivel de glucemia se haya normalizado. Tratamiento de la hipoglucemia grave La hipoglucemia se considera grave cuando su nivel de glucemia es igual o menor que $RemoveB'54mg'CXPZtEHy$ /dl (95mmol/l). La hipoglucemia grave es Engineering geologist. Solicite atencin mdica de inmediato. Si tiene hipoglucemia grave y no puede ingerir ningn alimento ni bebida, tal vez deba  aplicarse una inyeccin de glucagn. Un familiar o un amigo deben aprender a controlarle el nivel de glucemia y a aplicarle una inyeccin de glucagn. Pregntele al mdico si debera tener disponible un kit de inyecciones de glucagn de Freight forwarder. Es posible que la hipoglucemia grave deba tratarse en un hospital. El tratamiento puede incluir la administracin de glucosa a travs de una va intravenosa. Tambin puede necesitar un tratamiento para tratar la afeccin que est causando la hipoglucemia. Dnde buscar ms informacin  American Diabetes Association (Asociacin Estadounidense de la Diabetes): www.diabetes.[employer] of Diabetes and Digestive and Kidney Diseases ([place] Diabetes y las Enfermedades Digestivas y Renales): [employer].dk Comunquese con un mdico si:  Tiene problemas para mantener la glucemia dentro del rango ideal.  Tiene episodios frecuentes de hipoglucemia. Solicite ayuda inmediatamente si:  Contina teniendo sntomas de hipoglucemia despus de haber ingerido un alimento o una bebida que contengan glucosa.  Su nivel de glucemia es igual o menor que $RemoveB'54mg'IHWWEgcx$ /dl (26mmol/l).  Se desmaya.  Tiene una convulsin. Estos sntomas pueden representar un problema grave que constituye Engineer, maintenance (IT). No espere a ver si los sntomas desaparecen. Solicite atencin mdica de inmediato. Comunquese con el servicio de emergencias de su localidad (911 en los [place]). Resumen  Conozca los sntomas de la hipoglucemia, y cundo corre riesgo de padecerla (como durante la actividad fsica o cuando est enfermo). Controle  su glucemia con frecuencia cuando corra riesgo de sufrir hipoglucemia.  La hipoglucemia puede presentarse rpidamente, y puede ser peligrosa si no se trata de inmediato. Si tiene antecedentes de hipoglucemia grave, asegrese de saber cmo usar su kit de inyeccin de glucagn.  Asegrese de saber cmo tratar la hipoglucemia. Tenga a  mano un bocadillo con carbohidratos cuando pueda correr riesgo de sufrir hipoglucemia. Esta informacin no tiene Marine scientist el consejo del mdico. Asegrese de hacerle al mdico cualquier pregunta que tenga. Document Revised: 02/02/2018 Document Reviewed: 02/02/2018 Elsevier Patient Education  2021 [employer].     If you have lab work done today you will be contacted with your lab results within the next 2 weeks.  If you have not heard from [place] then please contact us. The fastest way to get your results is to register for My Chart.   IF you received an x-ray today, you will receive an invoice from [facility] Radiology. Please contact [facility] at [phone] with questions or concerns regarding your invoice.   IF you received labwork today, you will receive an invoice from [facility]. Please contact [facility] at [phone] with questions or concerns regarding your invoice.   Our billing staff will not be able to assist you with questions regarding bills from these companies.  You will be contacted with the lab results as soon as they are available. The fastest way to get your results is to activate your My Chart account. Instructions are located on the last page of this paperwork. If you have not heard from [place] regarding the results in 2 weeks, please contact this office.    DIABETIC SLIDING SCALE (II)  IF BLOOD SUGAR IS:   LESS THAN 100 (NO INSULIN)   100-140 (3 UNITS)   141-180 (6 UNITS)   181-220 (9 UNITS)   221-260 (12 UNITS)   261-300 (15 UNITS)   301-340 (18 UNITS)   MORE THAN 341 (21 UNITS) Diabetes mellitus y nutricin, en adultos Diabetes Mellitus and Nutrition, Adult Si sufre de diabetes, o diabetes mellitus, es muy importante tener hbitos alimenticios saludables debido a que sus niveles de Designer, television/film set sangre (glucosa) se ven afectados en gran medida por lo que come y bebe. [place] alimentos saludables en las cantidades correctas,  aproximadamente a la misma hora todos los [place], [place] ayudar a:  Aeronautical engineer glucemia.  Disminuir el riesgo de sufrir una enfermedad cardaca.  Mejorar la presin arterial.  Science writer o mantener un peso saludable. Qu puede afectar mi plan de alimentacin? Todas las personas que sufren de diabetes son diferentes y cada una tiene necesidades diferentes en cuanto a un plan de alimentacin. El mdico puede recomendarle que trabaje con un nutricionista para elaborar el mejor plan para usted. Su plan de alimentacin puede variar segn factores como:  Las caloras que necesita.  Los medicamentos que toma.  Su peso.  Sus niveles de glucemia, presin arterial y colesterol.  Su nivel de [place].  Otras afecciones que tenga, como enfermedades cardacas o renales. Cmo me afectan los carbohidratos? Los carbohidratos, o hidratos de carbono, afectan su nivel de glucemia ms que cualquier otro tipo de alimento. La ingesta de carbohidratos naturalmente aumenta la cantidad de [employer]. El recuento de carbohidratos es un mtodo destinado a Catering manager un registro de la cantidad de carbohidratos que se consumen. El recuento de carbohidratos es importante para Theatre manager la glucemia a un nivel saludable, especialmente si utiliza insulina o toma determinados medicamentos por va oral para la  diabetes. Es importante conocer la cantidad de carbohidratos que se pueden ingerir en cada comida sin correr Engineer, manufacturing. Esto es Psychologist, forensic. Su nutricionista puede ayudarlo a calcular la cantidad de carbohidratos que debe ingerir en cada comida y en cada refrigerio. Cmo me afecta el alcohol? El alcohol puede provocar disminuciones sbitas de la glucemia (hipoglucemia), especialmente si utiliza insulina o toma determinados medicamentos por va oral para la diabetes. La hipoglucemia es una afeccin potencialmente mortal. Los sntomas de la hipoglucemia, como somnolencia, mareos y confusin, son  similares a los sntomas de haber consumido demasiado alcohol.  No beba alcohol si: ? Su mdico le indica no hacerlo. ? Est embarazada, puede estar embarazada o est tratando de quedar embarazada.  Si bebe alcohol: ? No beba con el estmago vaco. ? Limite la cantidad que bebe:  De 0 a 1 medida por da para las mujeres.  De 0 a 2 medidas por da para los hombres. ? Est atento a la cantidad de alcohol que hay en las bebidas que toma. En los [place], una medida equivale a una botella de cerveza de 12oz (352ml), un vaso de vino de 5oz (138ml) o un vaso de una bebida alcohlica de alta graduacin de 1oz (28ml). ? Mantngase hidratado bebiendo agua, refrescos dietticos o t helado sin azcar.  Tenga en cuenta que los refrescos comunes, los jugos y otras bebida para Optician, dispensing pueden contener mucha azcar y se deben contar como carbohidratos. Consejos para seguir Photographer las etiquetas de los alimentos  Comience por leer el tamao de la porcin en la "Informacin nutricional" en las etiquetas de los alimentos envasados y las bebidas. La cantidad de caloras, carbohidratos, grasas y otros nutrientes mencionados en la etiqueta se basan en una porcin del alimento. Muchos alimentos contienen ms de una porcin por envase.  Verifique la cantidad total de gramos (g) de carbohidratos totales en una porcin. Puede calcular la cantidad de porciones de carbohidratos al dividir el total de carbohidratos por 15. Por ejemplo, si un alimento tiene un total de 30g de carbohidratos totales por porcin, equivale a 2 porciones de carbohidratos.  Verifique la cantidad de gramos (g) de grasas saturadas y grasas trans de una porcin. Escoja alimentos que no contengan estas grasas o que su contenido de estas sea [place].  Verifique la cantidad de miligramos (mg) de sal (sodio) en una porcin. La mayora de las personas deben limitar la ingesta de sodio total a menos de $Remove'2300mg'[id number]  por Training and development officer.  Siempre  consulte la informacin nutricional de los alimentos etiquetados como "con bajo contenido de grasa" o "sin grasa". Estos alimentos pueden tener un mayor contenido de Location manager agregada o carbohidratos refinados, y deben evitarse.  Hable con su nutricionista para identificar sus objetivos diarios en cuanto a los nutrientes mencionados en la etiqueta. Al ir de compras  Evite comprar alimentos procesados, enlatados o precocidos. Estos alimentos tienden a Special educational needs teacher mayor cantidad de [place], sodio y azcar agregada.  Compre en la zona exterior de la tienda de comestibles. Esta es la zona donde se encuentran con mayor frecuencia las frutas y las verduras frescas, los cereales a granel, las carnes frescas y los productos lcteos frescos. Al cocinar  Utilice mtodos de coccin a baja temperatura, como hornear, en lugar de mtodos de coccin a alta temperatura, como frer en abundante aceite.  Cocine con aceites saludables, como el aceite de [place], canola o [place].  Evite cocinar con manteca, crema o carnes con alto contenido de grasa.  Planificacin de las comidas  Coma las comidas y los refrigerios regularmente, preferentemente a la misma hora todos [place]. Evite pasar largos perodos de tiempo sin comer.  Consuma alimentos ricos en fibra, como frutas frescas, verduras, frijoles y cereales integrales. Consulte a su nutricionista sobre cuntas porciones de carbohidratos puede consumir en cada comida.  Consuma entre 4 y 6 onzas (entre 112 y 168g) de protenas magras por da, como carnes magras, pollo, pescado, huevos o tofu. Una onza (oz) de protena magra equivale a: ? 1 onza (28g) de carne, pollo o pescado. ? 1huevo. ?  de taza (62 g) de tofu.  Coma algunos alimentos por da que contengan grasas saludables, como aguacates, frutos secos, semillas y pescado.   Qu alimentos debo comer? Lambert Mody Bayas. Manzanas. Naranjas. Duraznos. Damascos. Ciruelas. Uvas. Mango. Papaya. [place]. Kiwi.  Cerezas. [place] Valeda Malm. [place]. Verduras de [employer], que incluyen col rizada, [place], hojas de [place] y de [place]. Remolachas. Coliflor. Repollo. Brcoli. Zanahorias. Judas verdes. Tomates. Pimientos. Cebollas. Pepinos. Coles de Bruselas. Granos Granos integrales, como panes, galletas, tortillas, cereales y pastas de salvado o integrales. Avena sin azcar. Quinua. Arroz integral o salvaje. Carnes y Psychiatric nurse. Carne de ave sin piel. Cortes magros de ave y carne de res. Tofu. Frutos secos. Semillas. Lcteos Productos lcteos sin grasa o con bajo contenido de [place], [place], yogur y [place]. Es posible que los productos que se enumeran ms [place] no constituyan una lista completa de los alimentos y las bebidas que puede tomar. Consulte a un nutricionista para obtener ms informacin. Qu alimentos debo evitar? Lambert Mody Frutas enlatadas al almbar. Verduras Verduras enlatadas. Verduras congeladas con mantequilla o salsa de crema. Granos Productos elaborados con [place] y [place], como panes, pastas, bocadillos y cereales. Evite todos los alimentos procesados. Carnes y otras protenas Cortes de carne con alto contenido de Lobbyist. Carne de ave con piel. Carnes empanizadas o fritas. Carne procesada. Evite las grasas saturadas. Lcteos [place]ogur, [place] enteros. Bebidas Bebidas azucaradas, como gaseosas o t helado. Es posible que los productos que se enumeran ms [place] no constituyan una lista completa de los alimentos y las bebidas que Nurse, adult. Consulte a un nutricionista para obtener ms informacin. Preguntas para hacerle al mdico  Es necesario que me rena con Radio broadcast assistant en el cuidado de la diabetes?  Es necesario que me rena con un nutricionista?  A qu nmero puedo llamar si tengo preguntas?  Cules son los mejores momentos para controlar la glucemia? Dnde encontrar ms informacin:  Asociacin Estadounidense de la Diabetes  (American Diabetes Association): diabetes.org  Academy of Nutrition and Dietetics (Academia de Nutricin y Information systems manager): www.eatright.[employer] of Diabetes and Digestive and Kidney Diseases ([place] la Diabetes y [place]az[place] y [place]): DesMoinesFuneral.dk  Association of Diabetes Care and Education Specialists (Asociacin de Especialistas en Atencin y Educacin sobre la Diabetes): www.diabeteseducator.org Resumen  Es importante tener hbitos alimenticios saludables debido a que sus niveles de Designer, television/film set sangre (glucosa) se ven afectados en gran medida por lo que come y bebe.  Un plan de alimentacin saludable lo ayudar a controlar la glucemia y Theatre manager un estilo de vida saludable.  El mdico puede recomendarle que trabaje con un nutricionista para elaborar el mejor plan para usted.  Tenga en cuenta que los carbohidratos (hidratos de carbono) y el alcohol tienen efectos inmediatos en sus niveles de glucemia. Es importante contar los carbohidratos que ingiere y consumir alcohol con prudencia. Esta informacin no  tiene Marine scientist el consejo del mdico. Asegrese de hacerle al mdico cualquier pregunta que tenga. Document Revised: 07/25/2019 Document Reviewed: 07/25/2019 Elsevier Patient Education  2021 Elsevier Inc.      Agustina Caroli, MD [facility]

## 2020-09-21 NOTE — Assessment & Plan Note (Signed)
Uncontrolled diabetes with hemoglobin A1c at 11.6. Continue glipizide and Metformin. Increase Lantus insulin to 20 units daily. Premeal lispro insulin as per sliding scale. Continue monitoring blood glucose levels at home. Hypoglycemia instructions given. Diet and nutrition discussed. Follow-up in 3 months.

## 2020-09-21 NOTE — Patient Instructions (Addendum)
Continua Metformin and glipizide. Aumenta daily Lantus insulin to 20 units. Premeal insulin as per sliding scale. Follow-up in 3 months.  Prevencin de la hipoglucemia Preventing Hypoglycemia La hipoglucemia ocurre cuando el nivel de azcar (glucosa) en la sangre es demasiado bajo. La hipoglucemia puede suceder en personas con o sin diabetes (diabetes mellitus). Puede desarrollarse rpidamente y ser una emergencia mdica. En [place] con diabetes, un nivel de glucemia inferior a 70 mg/dl (3,34mol/l) se considera hipoglucemia. La glucosa es un tipo de azcar que constituye la principal fuente de energa del cuerpo. Determinadas hormonas, como la insulina y eSecretary/administrator cProbation officerel nivel de glucosa en la [place] La insulina baja la glucemia, mientras que el glucagn la aNellis AFB Si tiene demasiada insulina en el torrente sanguneo o si no ingiere la cantidad suficiente de alimentos que contengan glucosa, puede tener hipoglucemia. Su riesgo de tBest boyhipoglucemia es mayor:  Si uCanadainsulina o medicamentos para la diabetes para ayudar a bTherapist, nutritionalde glucemia o ayudar a que el cuerpo produzca ms insulina.  Si omite o demora una comida o refrigerio.  Si est enfermo.  Durante y despus de hacer ejercicio. Puede prevenir la hipoglucemia si trabaja con su mdico para ajustar su plan de comida segn sea necesario y si toma otras precauciones. Cmo me puede afectar la hipoglucemia? Sntomas leves Es posible que la hipoglucemia leve no cause sntomas. Si tiene sntomas, pueden incluir los siguientes:  HBarling  Ansiedad.  Sudoracin y piel hmeda.  Mareos o sensacin de desvanecimiento.  Somnolencia.  Nuseas.  Aumento de la frecuencia cardaca.  Dolor de cN[place]  Visin borrosa.  Irritabilidad.  Hormigueo o adormecimiento alrededor de la boca, labios o lengua.  Cambios en la coordinacin.  Sueo agitado. Si la hipoglucemia leve no se reconoce y trata,  rpidamente puede convertirse en hipoglucemia moderada o grave. Sntomas moderados La hipoglucemia moderada puede causar los siguientes sntomas:  Confusin mental y deterioro del sentido de la realidad.  Cambios en el comportamiento.  Debilidad.  Latidos cardacos irregulares. Sntomas graves La hipoglucemia grave es una emergencia mdica. Puede ocasionar:  Desmayos.  Convulsiones.  Prdida de la conciencia (estado de coma).  Muerte. Qu cambios en la alimentacin se pueden realizar?  Trabaje junto con el mdico o con un especialista en alimentacin y nutricin (nutricionista) a fin de crear un plan de alimentacin que sea adecuado para usted. Cumpla con su plan de alimentacin de manera minuciosa.  Coma comidas a horarios regulares.  Si se lo recomienda el mdico, coma refrigerios eDTE Energy Company  No omita ni demore comidas o refrigerios. Puede correr riesgo de sufrir hipoglucemia si no recibe una cantidad suficiente de hidratos de carbono. Qu cambios en el estilo de vida se pueden realizar?  Trabaje conjuntamente con su mdico para controlar la glucemia. Asegrese de saber lo siguiente: ? Sus niveles objetivo de glucemia. ? Cmo y cundo controlar su nivel de glucemia. ? Los sntomas de la hipoglucemia. Es importante tratarla de inmediato para evitar que se agrave.  No beba alcohol con el estmago vaco.  Cuando est enfermo, controle su nivel de glucemia con ms frecuencia que la habitual. Siga el plan para los das de enfermedad cuando no pueda comer ni beber normalmente. Elabore este plan por adelantado con el mdico.  Controle su nivel de glucemia antes, durante y despus de hacer eDeshler   Cmo se trata? A menudo, el tratamiento de esta afeccin consiste en ingerir de inmediato un alimento o una bebida  que contenga azcar, por ejemplo:  De 4 a 6onzas (de 120 a 196m) de jugo de frutas.  De 4 a 6onzas (de 120 a 1554m de refresco comn (no  diettico).  4 onzas (12064mde lecUSG CorporationVarios caramelos duros.  1 cucharada (31m65me azcar o miel. Tratamiento de la hipoglucemia en las personas con diabetes Si est alerta y puede tragar de maneGeographical information systems officerura, siga la regla 15/15, que consiste en lo siguiente:  Consuma 15gramos de hidratos de carbono de accin rpida. Hable con su mdico acerca de cunto debera consumir.  Las opciones de accin rpida incluyen: ? Pastillas de glucosa (consuma 15 gramos). ? De 6 a 8unidades de caramelos duros. ? De 4 a 6onzas (de 120 a 150ml46m jugo de frutas. ? De 4 a 6onzas (de 120 a 150ml)32mrefresco comn (no diettico).  Contrlese la glucemia 31minu75mdespus de ingerir el hidrato de carbono.  Si este nuevo nivel de glucemia todava es igual o menor que 70mg/dl47m9mmol/l)32mngiera nuevamente 15gramos de un hidrato de carbono.  Si el nivel de glucemia no supera los 70mg/dl (68mmol/l) d87mus de 3intentos, solicite ayuda de inmediato.  Ingiera una comida o una colacin en el transcurso de 1hora despus de que su nivel de glucemia se haya normalizado. Tratamiento de la hipoglucemia grave La hipoglucemia se considera grave cuando su nivel de glucemia es igual o menor que 54mg/dl (65m27ml). La77mpoglucemia grave es una emergenciEngineering geologistencin mdica de inmediato. Si tiene hipoglucemia grave y no puede ingerir ningn alimento ni bebida, tal vez deba aplicarse una inyeccin de glucagn. Un familiar o un amigo deben aprender a controlarle el nivel de glucemia y a aplicarle una inyeccin de glucagn. Pregntele al mdico si debera tener disponible un kit de inyecciones de glucagn de emergencia. EFreight forwarderque la hipoglucemia grave deba tratarse en un hospital. El tratamiento puede incluir la administracin de glucosa a travs de una va intravenosa. Tambin puede necesitar un tratamiento para tratar la afeccin que est causando la hipoglucemia. Dnde buscar  ms informacin  American Diabetes Association (Asociacin Estadounidense de la Diabetes): www.diabetes.org  NationalCSX Corporationand Digestive and Kidney Diseases (Instituto NacMcBrideas Enfermedades Digestivas y Renales): www.niddk.nihDesMoinesFuneral.dkon un mdico si:  Tiene problemas para mantener la glucemia dentro del rango ideal.  Tiene episodios frecuentes de hipoglucemia. Solicite ayuda inmediatamente si:  Contina teniendo sntomas de hipoglucemia despus de haber ingerido un alimento o una bebida que contengan glucosa.  Su nivel de glucemia es igual o menor que 54mg/dl (65mmo56m.  Se 74mmaya.  Tiene una convulsin. Estos sntomas pueden representar un problema grave que constituye una emergencia.Engineer, maintenance (IT)er si los sntomas desaparecen. Solicite atencin mdica de inmediato. Comunquese con el servicio de emergencias de su localidad (911 en los [place]). Resumen  Conozca los sntomas de la hipoglucemia, y cundo corre riesgo de padecerla (como durante la actividad fsica o cuando est enfermo). Controle su glucemia con frecuencia cuando corra riesgo de sufrir hipoglucemia.  La hipoglucemia puede presentarse rpidamente, y puede ser peligrosa si no se trata de inmediato. Si tiene antecedentes de hipoglucemia grave, asegrese de saber cmo usar su kit de inyeccin de glucagn.  Asegrese de saber cmo tratar la hipoglucemia. Tenga a mano un bocadillo con carbohidratos cuando pueda correr riesgo de sufrir hipoglucemia. Esta informacin no tiene como fin reemplMarine scientist mdico. Asegrese de hacerle al mdico cualquier pregunta que tenga. Document Revised: 02/02/2018  Document Reviewed: 02/02/2018 Elsevier Patient Education  2021 [employer].     If you have lab work done today you will be contacted with your lab results within the next 2 weeks.  If you have not heard from [place] then please contact us. The fastest way to get your  results is to register for My Chart.   IF you received an x-ray today, you will receive an invoice from [facility]. Please contact [facility] at [phone] with questions or concerns regarding your invoice.   IF you received labwork today, you will receive an invoice from [facility]. Please contact [facility] at [phone] with questions or concerns regarding your invoice.   Our billing staff will not be able to assist you with questions regarding bills from these companies.  You will be contacted with the lab results as soon as they are available. The fastest way to get your results is to activate your My Chart account. Instructions are located on the last page of this paperwork. If you have not heard from [place] regarding the results in 2 weeks, please contact this office.    DIABETIC SLIDING SCALE (II)  IF BLOOD SUGAR IS:   LESS THAN 100 (NO INSULIN)   100-140 (3 UNITS)   141-180 (6 UNITS)   181-220 (9 UNITS)   221-260 (12 UNITS)   261-300 (15 UNITS)   301-340 (18 UNITS)   MORE THAN 341 (21 UNITS) Diabetes mellitus y nutricin, en adultos Diabetes Mellitus and Nutrition, Adult Si sufre de diabetes, o diabetes mellitus, es muy importante tener hbitos alimenticios saludables debido a que sus niveles de Designer, television/film set sangre (glucosa) se ven afectados en gran medida por lo que come y bebe. [place] alimentos saludables en las cantidades correctas, aproximadamente a la misma hora todos los [place], [place] ayudar a:  Aeronautical engineer glucemia.  Disminuir el riesgo de sufrir una enfermedad cardaca.  Mejorar la presin arterial.  Science writer o mantener un peso saludable. Qu puede afectar mi plan de alimentacin? Todas las personas que sufren de diabetes son diferentes y cada una tiene necesidades diferentes en cuanto a un plan de alimentacin. El mdico puede recomendarle que trabaje con un nutricionista para elaborar el mejor plan para usted. Su plan de alimentacin  puede variar segn factores como:  Las caloras que necesita.  Los medicamentos que toma.  Su peso.  Sus niveles de glucemia, presin arterial y colesterol.  Su nivel de [place].  Otras afecciones que tenga, como enfermedades cardacas o renales. Cmo me afectan los carbohidratos? Los carbohidratos, o hidratos de carbono, afectan su nivel de glucemia ms que cualquier otro tipo de alimento. La ingesta de carbohidratos naturalmente aumenta la cantidad de [employer]. El recuento de carbohidratos es un mtodo destinado a Catering manager un registro de la cantidad de carbohidratos que se consumen. El recuento de carbohidratos es importante para Theatre manager la glucemia a un nivel saludable, especialmente si utiliza insulina o toma determinados medicamentos por va oral para la diabetes. Es importante conocer la cantidad de carbohidratos que se pueden ingerir en cada comida sin correr Engineer, manufacturing. Esto es Psychologist, forensic. Su nutricionista puede ayudarlo a calcular la cantidad de carbohidratos que debe ingerir en cada comida y en cada refrigerio. Cmo me afecta el alcohol? El alcohol puede provocar disminuciones sbitas de la glucemia (hipoglucemia), especialmente si utiliza insulina o toma determinados medicamentos por va oral para la diabetes. La hipoglucemia es Herbalist mortal. Los sntomas de la hipoglucemia, como somnolencia,  mareos y confusin, son similares a los sntomas de haber consumido demasiado alcohol.  No beba alcohol si: ? Su mdico le indica no hacerlo. ? Est embarazada, puede estar embarazada o est tratando de quedar embarazada.  Si bebe alcohol: ? No beba con el estmago vaco. ? Limite la cantidad que bebe:  De 0 a 1 medida por da para las mujeres.  De 0 a 2 medidas por da para los hombres. ? Est atento a la cantidad de alcohol que hay en las bebidas que toma. En los [place], una medida equivale a una botella de cerveza de 12oz  (360m), un vaso de vino de 5oz (1430m o un vaso de una bebida alcohlica de alta graduacin de 1oz (4451m ? Mantngase hidratado bebiendo agua, refrescos dietticos o t helado sin azcar.  Tenga en cuenta que los refrescos comunes, los jugos y otras bebida para mezOptician, dispensingeden contener mucha azcar y se deben contar como carbohidratos. Consejos para seguir estPhotographers etiquetas de los alimentos  Comience por leer el tamao de la porcin en la "Informacin nutricional" en las etiquetas de los alimentos envasados y las bebidas. La cantidad de caloras, carbohidratos, grasas y otros nutrientes mencionados en la etiqueta se basan en una porcin del alimento. Muchos alimentos contienen ms de una porcin por envase.  Verifique la cantidad total de gramos (g) de carbohidratos totales en una porcin. Puede calcular la cantidad de porciones de carbohidratos al dividir el total de carbohidratos por 15. Por ejemplo, si un alimento tiene un total de 30g de carbohidratos totales por porcin, equivale a 2 porciones de carbohidratos.  Verifique la cantidad de gramos (g) de grasas saturadas y grasas trans de una porcin. Escoja alimentos que no contengan estas grasas o que su contenido de estas sea bajReadlynVerifique la cantidad de miligramos (mg) de sal (sodio) en una porcin. La mayState Farm las personas deben limitar la ingesta de sodio total a menos de 2300m51mr da. Training and development officeriempre consulte la informacin nutricional de los alimentos etiquetados como "con bajo contenido de grasa" o "sin grasa". Estos alimentos pueden tener un mayor contenido de azcaLocation manageregada o carbohidratos refinados, y deben evitarse.  Hable con su nutricionista para identificar sus objetivos diarios en cuanto a los nutrientes mencionados en la etiqueta. Al ir de compras  Evite comprar alimentos procesados, enlatados o precocidos. Estos alimentos tienden a teneSpecial educational needs teacheror cantidad de grasClallam Baydio y azcar agregada.  Compre en  la zona exterior de la tienda de comestibles. Esta es la zona donde se encuentran con mayor frecuencia las frutas y las verduras frescas, los cereales a granel, las carnes frescas y los productos lcteos frescos. Al cocinar  Utilice mtodos de coccin a baja temperatura, como hornear, en lugar de mtodos de coccin a alta temperatura, como frer en abundante aceite.  Cocine con aceites saludables, como el aceite de olivWallulanola o giraEast Molinevite cocinar con manteca, crema o carnes con alto contenido de grasa. Planificacin de las comidas  Coma las comidas y los refrigerios regularmente, preferentemente a la misma hora todos los La Feriaite pasar largos perodos de tiempo sin comer.  Consuma alimentos ricos en fibra, como frutas frescas, verduras, frijoles y cereales integrales. Consulte a su nutricionista sobre cuntas porciones de carbohidratos puede consumir en cada comida.  Consuma entre 4 y 6 onzas (entre 112 y 168g) de protenas magras por da, como carnes magras, pollo, pescado, huevos o tofu. Una onza (oz) de protena magrRowland Lathe?  1 onza (28g) de carne, pollo o pescado. ? 1huevo. ?  de taza (62 g) de tofu.  Coma algunos alimentos por da que contengan grasas saludables, como aguacates, frutos secos, semillas y pescado.   Qu alimentos debo comer? Lambert Mody Bayas. Manzanas. Naranjas. Duraznos. Damascos. Ciruelas. Uvas. Mango. Papaya. [place]. Kiwi. Cerezas. [place] Valeda Malm. Espinaca. Verduras de [employer], que incluyen col rizada, [place], hojas de [place] y de [place]. Remolachas. Coliflor. Repollo. Brcoli. Zanahorias. Judas verdes. Tomates. Pimientos. Cebollas. Pepinos. Coles de Bruselas. Granos Granos integrales, como panes, galletas, tortillas, cereales y pastas de salvado o integrales. Avena sin azcar. Quinua. Arroz integral o salvaje. Carnes y Psychiatric nurse. Carne de ave sin piel. Cortes magros de ave y carne de res. Tofu. Frutos secos.  Semillas. Lcteos Productos lcteos sin grasa o con bajo contenido de [place], [place], yogur y [place]. Es posible que los productos que se enumeran ms [place] no constituyan una lista completa de los alimentos y las bebidas que puede tomar. Consulte a un nutricionista para obtener ms informacin. Qu alimentos debo evitar? Lambert Mody Frutas enlatadas al almbar. Verduras Verduras enlatadas. Verduras congeladas con mantequilla o salsa de crema. Granos Productos elaborados con [place] y [place], como panes, pastas, bocadillos y cereales. Evite todos los alimentos procesados. Carnes y otras protenas Cortes de carne con alto contenido de Lobbyist. Carne de ave con piel. Carnes empanizadas o fritas. Carne procesada. Evite las grasas saturadas. Lcteos Yogur, [place] enteros. Bebidas Bebidas azucaradas, como gaseosas o t helado. Es posible que los productos que se enumeran ms [place] no constituyan una lista completa de los alimentos y las bebidas que Nurse, adult. Consulte a un nutricionista para obtener ms informacin. Preguntas para hacerle al mdico  Es necesario que me rena con Radio broadcast assistant en el cuidado de la diabetes?  Es necesario que me rena con un nutricionista?  A qu nmero puedo llamar si tengo preguntas?  Cules son los mejores momentos para controlar la glucemia? Dnde encontrar ms informacin:  Asociacin Estadounidense de la Diabetes (American Diabetes Association): diabetes.org  Academy of Nutrition and Dietetics (Academia de Nutricin y Information systems manager): www.eatright.CSX Corporation of Diabetes and Digestive and Kidney Diseases ([place] la Diabetes y [place] y Renales): DesMoinesFuneral.dk  Association of Diabetes Care and Education Specialists (Asociacin de Especialistas en Atencin y Educacin sobre la Diabetes): www.diabeteseducator.org Resumen  Es importante tener hbitos alimenticios saludables debido  a que sus niveles de Designer, television/film set sangre (glucosa) se ven afectados en gran medida por lo que come y bebe.  Un plan de alimentacin saludable lo ayudar a controlar la glucemia y Theatre manager un estilo de vida saludable.  El mdico puede recomendarle que trabaje con un nutricionista para elaborar el mejor plan para usted.  Tenga en cuenta que los carbohidratos (hidratos de carbono) y el alcohol tienen efectos inmediatos en sus niveles de glucemia. Es importante contar los carbohidratos que ingiere y consumir alcohol con prudencia. Esta informacin no tiene Marine scientist el consejo del mdico. Asegrese de hacerle al mdico cualquier pregunta que tenga. Document Revised: 07/25/2019 Document Reviewed: 07/25/2019 Elsevier Patient Education  2021 [employer].

## 2020-09-21 NOTE — Assessment & Plan Note (Signed)
Well-controlled hypertension.  Continue amlodipine 5 mg daily and daily baby aspirin.

## 2020-10-12 ENCOUNTER — Encounter: Payer: Self-pay | Admitting: Nurse Practitioner

## 2020-10-12 ENCOUNTER — Ambulatory Visit: Payer: Medicare HMO | Admitting: Nurse Practitioner

## 2020-10-12 VITALS — BP 120/78 | HR 104 | Ht 68.0 in | Wt 167.2 lb

## 2020-10-12 DIAGNOSIS — B370 Candidal stomatitis: Secondary | ICD-10-CM

## 2020-10-12 DIAGNOSIS — R079 Chest pain, unspecified: Secondary | ICD-10-CM | POA: Diagnosis not present

## 2020-10-12 DIAGNOSIS — R1319 Other dysphagia: Secondary | ICD-10-CM

## 2020-10-12 NOTE — Patient Instructions (Signed)
Si tiene 65 aos o ms, su ndice de [employer] corporal debe estar entre 23 y 17. Su ndice de masa corporal es de 25,42 kg/m. Si esto est fuera del rango mencionado anteriormente, considere hacer un seguimiento con su proveedor de Midwife.  Si tiene 31 aos o menos, su ndice de [employer] corporal debe estar entre 9 y 60. Su ndice de masa corporal es de 25,42 kg/m. Si esto est fuera del rango mencionado anteriormente, considere hacer un seguimiento con su proveedor de Midwife.  Disminuya Pantoprazol a 1 tableta por la maana.  Asegrese de enjuagarse la boca despus de usar sus inhaladores  Llame a la oficina si contina teniendo problemas recurrentes para tragar.  Haga un seguimiento segn sea necesario.  Gracias por confiarme su atencin y [place].  Tye Savoy, NP-C   Enfermedad de reflujo gastroesofgico en los adultos Gastroesophageal Reflux Disease, Adult  El reflujo gastroesofgico (RGE) ocurre cuando el cido del estmago sube por el tubo que conecta la boca con el estmago (esfago). Normalmente, la comida baja por el esfago y se mantiene en el estmago, donde se la digiere. Cuando una persona tiene RGE, los alimentos y el cido estomacal suelen volver al esfago. Usted puede tener una enfermedad llamada enfermedad de reflujo gastroesofgico (ERGE) si el reflujo:  Sucede a menudo.  Causa sntomas frecuentes o muy intensos.  Causa problemas tales como dao en el esfago. Cuando esto ocurre, el esfago duele y se hincha. Con el tiempo, la ERGE puede ocasionar pequeos agujeros (lceras) en el revestimiento del esfago. Cules son las causas? Esta afeccin se debe a un problema en el msculo que se encuentra entre el esfago y [place]. Cuando este msculo est dbil o no es normal, no se cierra correctamente para impedir que los alimentos y el cido regresen del Paramedic. El msculo puede debilitarse debido a lo siguiente:  El  consumo de [place].  Clarita.  Tener cierto tipo de hernia (hernia de hiato).  Consumo de alcohol.  Ciertos alimentos y bebidas, como caf, chocolate, cebollas y [place]. Qu incrementa el riesgo?  Tener sobrepeso.  Tener una enfermedad que afecta el tejido conjuntivo.  Tomar antiinflamatorios no esteroideos (AINE), como el ibuprofeno. Cules son los signos o sntomas?  Acidez estomacal.  Dificultad o dolor al tragar.  Sensacin de Best boy un bulto en la garganta.  Sabor amargo en la boca.  Mal aliento.  Tener una gran cantidad de saliva.  Estmago inflamado o con Tree surgeon.  Eructos.  Dolor en el pecho. El dolor de pecho puede deberse a distintas afecciones. Asegrese de Teacher, adult education a su mdico si tiene Tourist information centre manager.  Dificultad para respirar o sibilancias.  Ardelia Mems tos a largo plazo o tos nocturna.  Desgaste de la superficie de los dientes (esmalte dental).  Prdida de peso. Cmo se trata?  Realizar cambios en la dieta.  Tomar medicamentos.  Someterse a [place]. El tratamiento depender de la gravedad de los sntomas. Siga estas instrucciones en su casa: Comida y bebida  Siga una dieta como se lo haya indicado el mdico. Es posible que deba evitar alimentos y bebidas, por ejemplo: ? Caf y t negro, con o sin cafena. ? Bebidas que contengan alcohol. ? Bebidas energticas y deportivas. ? Bebidas gaseosas (carbonatadas) y refrescos. ? Chocolate y cacao. ? Menta y [place]. ? Ajo y cebolla. ? Rbano picante. ? Alimentos cidos y condimentados. Estos incluyen todos los tipos de pimientos, [place] en polvo, curry en polvo,  vinagre, salsas picantes y salsa barbacoa. ? Ctricos y sus jugos, por ejemplo, naranjas, limones y limas. ? Alimentos que [employer]. Estos incluyen salsa roja, [place], salsa picante y pizza con salsa de [place]met. ? Alimentos fritos y Radio broadcast assistant. Estos incluyen donas, papas fritas, papitas fritas de bolsa y aderezos con alto  contenido de [place]. ? Carnes con alto contenido de [place]. Estas incluye los perros calientes, chuletas o costillas, embutidos, jamn y tocino. ? Productos lcteos ricos en grasas, como leche [place], [place] y [place] crema.  Consuma pequeas cantidades de comida con ms frecuencia. Evite consumir porciones abundantes.  Evite beber grandes cantidades de lquidos con las comidas.  Evite comer 2 o 3horas antes de acostarse.  Evite recostarse inmediatamente despus de comer.  No haga ejercicios enseguida despus de comer.   Estilo de vida  No fume ni consuma ningn producto que contenga nicotina o tabaco. Si necesita ayuda para dejar de consumir estos productos, consulte al mdico.  Intente reducir el nivel de estrs. Si necesita ayuda para hacer esto, consulte al mdico.  Si tiene sobrepeso, baje una cantidad de peso saludable para usted. Consulte a su mdico para bajar de peso de [employer].   Instrucciones generales  Est atento a cualquier cambio en los sntomas.  Tome los medicamentos de venta libre y los recetados solamente como se lo haya indicado el mdico.  No tome aspirina, ibuprofeno ni otros antiinflamatorios no esteroideos (AINE) a menos que el mdico lo autorice.  Use ropa holgada. No use nada apretado alrededor de la cintura.  Levante (eleve) la cabecera de la cama aproximadamente 6pulgadas (15cm). Para hacerlo, es posible que tenga que utilizar una cua.  Evite inclinarse si al hacerlo empeoran los sntomas.  Cumpla con todas las visitas de seguimiento. Comunquese con un mdico si:  Aparecen nuevos sntomas.  Adelgaza y no sabe por qu.  Tiene problemas para tragar o le duele cuando traga.  Tiene sibilancias o tos persistente.  Tiene la voz ronca.  Los sntomas no mejoran con Dispensing optician. Solicite ayuda de inmediato si:  Tree surgeon repentino [employer], el cuello, la [place], los dientes o la espalda.  De repente se siente transpirado,  mareado o aturdido.  Siente falta de aire o Tourist information centre manager.  Vomita y el vmito es de color verde, amarillo o negro, o tiene un aspecto similar a la sangre o a los posos de caf.  Se desmaya.  Las heces (deposiciones) son rojas, sanguinolentas o negras.  No puede tragar, beber o comer. Estos sntomas pueden representar un problema grave que constituye Engineer, maintenance (IT). No espere a ver si los sntomas desaparecen. Solicite atencin mdica de inmediato. Comunquese con el servicio de emergencias de su localidad (911 en los [place]). No conduzca por sus propios medios Principal Financial. Resumen  Si una persona tiene enfermedad de reflujo gastroesofgico (ERGE), los alimentos y el cido estomacal suben al esfago y causan sntomas o problemas tales como dao en el esfago.  El tratamiento depender de la gravedad de los sntomas.  Siga una dieta como se lo haya indicado el mdico.  Use todos los medicamentos solamente como se lo haya indicado el mdico. Esta informacin no tiene Marine scientist el consejo del mdico. Asegrese de hacerle al mdico cualquier pregunta que tenga. Document Revised: 01/31/2020 Document Reviewed: 01/31/2020 Elsevier Patient Education  [place].

## 2020-10-12 NOTE — Progress Notes (Signed)
ASSESSMENT AND PLAN    # 65 year old male recently hospitalized with dysphagia and chest pain .  Cardiac evaluation negative.  Inpatient EGD remarkable for esophageal candidiasis.  Chest pain significantly improved following course of Diflucan and high-dose PPI.  Chest pain possibly secondary to to GERD.  --He can reduce pantoprazole to once daily in the morning --Anti-reflux measures discussed. .Certain food / beverages may make GERD symptoms worse such as alcohol, chocolate, fried or high fat foods, peppermint, carbonated beverages, spicy foods, onions, tomatoes, garlic, and tomato-based foods, citrus fruits, and juices.  Avoid late evening meals / bedtime snacking.  If able, elevate head of bed 6-8 inches. If unable to elevate the head of the bed consider a wedge pillow.   Weight reduction / maintaining a healthy BMI (body mass index) is important as increased abdominal girth is associated with reflux.  --Patient will call if he has any recurrent dysphagia.  We discussed importance of rinsing out mouth after use of steroid inhalers  #Non-H. pylori related gastritis --Continue daily PPI for now  #Cholelithiasis.  This appears to have been an incidental finding during recent evaluation of chest pain.  No evidence for cholecystitis.  HIDA scan negative   #History of adenomatous colon polyps November 2019 (two small polyps (less than 10 mm in size).  --He is on the recall list for a 5-year interval colonoscopy in 2024  [place]    Chief Complaint :hospital follow up on chest pain and swallowing problems   Sotero Brinkmeyer is a 65 y.o. male known to Dr. Havery Moros with a past medical history of mesenteric ischemia, hypertension, hyperlipidemia , COPD, diabetes, H. pylori gastritis in 2017, Cholelithiasis.   Patient is non-English speaking, here with interpreter.   Carlee was evaluated in the hospital 09/10/2020 for chest pain and shortness of breath. There were no  concerns for a cardiac event at that time. Symptoms attributed to COPD exacerbation, he was prescribed steroids and discharged home.  Patient returned to the ED with ongoing symptoms on 09/13/2020.  Cardiology evaluated and arranged for Coronary CTA which showed no CAD .  Chest pain felt to be noncardiac in nature . We were asked to evaluate him for chest pain and dysphagia.  Inpatient EGD showed mild erosive gastropathy, candidiasis, mild gastritis.  Biopsies negative for H. pylori.  He was treated with Diflucan.and high dose PPI   INTERVAL HISTORY: Patient completed course of Diflucan.  He has continued taking Protonix BID.  Patient has not had any further problems swallowing food . Chest pain has significantly improved . He has had only 1-2 episodes of burning and pressure type chest pain since hospital.discharge. Chest pain radiates down left arm and he did have some associated SOB.  Both episodes occurred during a meal and lasted 30 minutes   DATA REVIEWED:  09/14/2020 abdominal ultrasound  --Cholelithiasis with evidence of Colecystitis  09/14/2020 HIDA 1. Visualization of the gallbladder indicates patent cystic duct and IN CONSISTENT with acute cholecystitis. 2. Patent common bile duct.  09/16/20 CBC normal   PREVIOUS ENDOSCOPIC EVALUATIONS / PERTINENT STUDIES:   November 2019 screening colonoscopy --Diverticulosis in the sigmoid colon and in the ascending colon. - One 3 mm polyp in the cecum, removed with a cold snare. Resected and retrieved. - One 4 mm polyp at the recto-sigmoid colon, removed with a cold snare. Resected and retrieved. - Tortuous colon.  Path compatible with tubular adenoma.  5-year interval recall colonoscopy recommended    March  2022 EGD for chest pain and dysphagia --Complete exam good prep --Esophageal plaques were found, consistent with candidiasis. Biopsied. This is the likely cause of dysphagia. - Z-line regular, 38 cm from the incisors. - Mild  erosive gastropathy with no bleeding and no stigmata of recent bleeding. Biopsied. - Normal examined duodenum  FINAL MICROSCOPIC DIAGNOSIS:   A. STOMACH, BIOPSY:  - Gastric antral mucosa with evidence of healing erosion/ulcer.  - Gastric oxyntic mucosa with mild chronic gastritis.  - Warthin-Starry stain is negative for Helicobacter pylori.   B. ESOPHAGUS, BIOPSY:  - Candida esophagitis [PAS/F stain performed].      Past Medical History:  Diagnosis Date  . Diabetes mellitus ([place]) 10/2014   pt denies being diabetic.   . Emphysema of lung ([place])   . Emphysema/COPD ([place]) 10/2014  . Hepatic steatosis   . Thrombocytopenia ([place]) 09/2015   platelets in 120s.     Current Medications, Allergies, Past Surgical History, Family History and Social History were reviewed in [employer] record.   Current Outpatient Medications  Medication Sig Dispense Refill  . pantoprazole (PROTONIX) 40 MG tablet TAKE 1 TABLET BY MOUTH 2 TIMES DAILY BEFORE MEALS 60 tablet 2  . acetaminophen (TYLENOL) 500 MG tablet Take 1,000 mg by mouth every 6 (six) hours as needed for moderate pain or headache.    . albuterol (PROVENTIL) (2.5 MG/3ML) 0.083% nebulizer solution TAKE 3 ML(2.5 MG TOTAL) BY NEBULIZATION EVERY 6(SIX) HOURS AS NEEDED (Patient taking differently: Take 2.5 mg by nebulization every 6 (six) hours as needed for wheezing or shortness of breath.) 150 mL 3  . amLODipine (NORVASC) 5 MG tablet Take 1 tablet (5 mg total) by mouth daily. 90 tablet 3  . aspirin EC 81 MG EC tablet Take 1 tablet (81 mg total) by mouth daily. 30 tablet 1  . atorvastatin (LIPITOR) 20 MG tablet Take 1 tablet (20 mg total) by mouth daily. 90 tablet 3  . blood glucose meter kit and supplies KIT Dispense based on patient and insurance preference. Use up to four times daily as directed. 1 each 0  . budesonide-formoterol (SYMBICORT) 160-4.5 MCG/ACT inhaler Inhale 2 puffs into the lungs 2 (two) times daily. 1 each 11  .  glipiZIDE (GLUCOTROL) 10 MG tablet Take 1 tablet (10 mg total) by mouth 2 (two) times daily before a meal. 60 tablet 3  . insulin glargine (LANTUS) 100 UNIT/ML Solostar Pen Inject 20 Units into the skin daily. 18 mL 3  . insulin lispro (HUMALOG) 100 UNIT/ML KwikPen Inject 3 Units into the skin 3 (three) times daily. Adjust amount of insulin as per sliding scale. 8.1 mL 3  . Insulin Pen Needle 32G X 4 MM MISC USE TO INJECT LANTUS AND HUMALOG 4 TIMES DAILY 400 each 0  . metFORMIN (GLUCOPHAGE) 1000 MG tablet Take 1 tablet (1,000 mg total) by mouth 2 (two) times daily with a meal. 180 tablet 3   No current facility-administered medications for this visit.   Facility-Administered Medications Ordered in Other Visits  Medication Dose Route Frequency Provider Last Rate Last Admin  . morphine 2 MG/ML injection             Review of Systems: No abdominal pain.  No bowel changes . No urinary complaints.   PHYSICAL EXAM :    Wt Readings from Last 3 Encounters:  10/12/20 167 lb 3.2 oz (75.8 kg)  09/21/20 163 lb (73.9 kg)  09/16/20 164 lb 0.4 oz (74.4 kg)    BP  120/78 (BP Location: Left Arm, Patient Position: Sitting)   Pulse (!) 104   Ht _0  (1.727 m)   Wt 167 lb 3.2 oz (75.8 kg)   SpO2 98%   BMI 25.42 kg/m  Constitutional:  Pleasant well-developed male in no acute distress. Psychiatric: Normal mood and affect. Behavior is normal. EENT: Pupils normal.  Conjunctivae are normal. No scleral icterus.  No oral Candida appreciated Neck supple.  Cardiovascular: Normal rate, regular rhythm. No edema Pulmonary/chest: Effort normal and breath sounds normal. No wheezing, rales or rhonchi. Abdominal: Soft, nondistended, nontender. Bowel sounds active throughout. There are no masses palpable. No hepatomegaly. Neurological: Alert and oriented to person place and time. Skin: Skin is warm and dry. No rashes noted.  I spent 30 minutes total reviewing records, obtaining history, performing exam,  counseling patient and documenting visit / findings.   Tye Savoy, NP  10/12/2020, 11:32 AM

## 2020-10-13 ENCOUNTER — Encounter: Payer: Self-pay | Admitting: Nurse Practitioner

## 2020-10-13 NOTE — Progress Notes (Signed)
Agree with assessment and plan as outlined.  

## 2020-10-14 ENCOUNTER — Other Ambulatory Visit: Payer: Self-pay | Admitting: Emergency Medicine

## 2020-10-16 ENCOUNTER — Other Ambulatory Visit: Payer: Self-pay | Admitting: Emergency Medicine

## 2020-10-19 ENCOUNTER — Telehealth: Payer: Self-pay | Admitting: Emergency Medicine

## 2020-10-19 MED ORDER — ACCU-CHEK GUIDE VI STRP: ORAL_STRIP | 0 refills | Status: DC

## 2020-10-19 NOTE — Telephone Encounter (Signed)
Pharmacy called and said that the patient insurance prefers him to have the One Touch meter and kit supplies. It can be sent to [facility] [address] - [place], [place]. AT [place]

## 2020-10-20 ENCOUNTER — Observation Stay (HOSPITAL_COMMUNITY)
Admission: EM | Admit: 2020-10-20 | Discharge: 2020-10-21 | Disposition: A | Discharge status: Home / Self Care | Payer: Medicare HMO | Attending: Internal Medicine | Admitting: Internal Medicine

## 2020-10-20 ENCOUNTER — Encounter (HOSPITAL_COMMUNITY): Payer: Self-pay | Admitting: Emergency Medicine

## 2020-10-20 ENCOUNTER — Emergency Department (HOSPITAL_COMMUNITY): Payer: Medicare HMO

## 2020-10-20 DIAGNOSIS — E119 Type 2 diabetes mellitus without complications: Secondary | ICD-10-CM | POA: Diagnosis not present

## 2020-10-20 DIAGNOSIS — R053 Chronic cough: Secondary | ICD-10-CM | POA: Diagnosis present

## 2020-10-20 DIAGNOSIS — Z7984 Long term (current) use of oral hypoglycemic drugs: Secondary | ICD-10-CM | POA: Insufficient documentation

## 2020-10-20 DIAGNOSIS — J441 Chronic obstructive pulmonary disease with (acute) exacerbation: Secondary | ICD-10-CM | POA: Diagnosis not present

## 2020-10-20 DIAGNOSIS — J189 Pneumonia, unspecified organism: Secondary | ICD-10-CM | POA: Diagnosis not present

## 2020-10-20 DIAGNOSIS — E1165 Type 2 diabetes mellitus with hyperglycemia: Secondary | ICD-10-CM

## 2020-10-20 DIAGNOSIS — Z20822 Contact with and (suspected) exposure to covid-19: Secondary | ICD-10-CM | POA: Diagnosis not present

## 2020-10-20 DIAGNOSIS — R000 Tachycardia, unspecified: Secondary | ICD-10-CM | POA: Diagnosis not present

## 2020-10-20 DIAGNOSIS — Z79899 Other long term (current) drug therapy: Secondary | ICD-10-CM | POA: Diagnosis not present

## 2020-10-20 DIAGNOSIS — R059 Cough, unspecified: Secondary | ICD-10-CM | POA: Diagnosis not present

## 2020-10-20 DIAGNOSIS — R69 Illness, unspecified: Secondary | ICD-10-CM | POA: Diagnosis not present

## 2020-10-20 DIAGNOSIS — Z794 Long term (current) use of insulin: Secondary | ICD-10-CM | POA: Diagnosis not present

## 2020-10-20 DIAGNOSIS — F17210 Nicotine dependence, cigarettes, uncomplicated: Secondary | ICD-10-CM | POA: Diagnosis not present

## 2020-10-20 DIAGNOSIS — I10 Essential (primary) hypertension: Secondary | ICD-10-CM | POA: Diagnosis not present

## 2020-10-20 DIAGNOSIS — R042 Hemoptysis: Secondary | ICD-10-CM | POA: Diagnosis not present

## 2020-10-20 DIAGNOSIS — I2510 Atherosclerotic heart disease of native coronary artery without angina pectoris: Secondary | ICD-10-CM | POA: Insufficient documentation

## 2020-10-20 DIAGNOSIS — Z7982 Long term (current) use of aspirin: Secondary | ICD-10-CM | POA: Diagnosis not present

## 2020-10-20 DIAGNOSIS — R079 Chest pain, unspecified: Secondary | ICD-10-CM | POA: Diagnosis not present

## 2020-10-20 LAB — BASIC METABOLIC PANEL
Anion gap: 10 (ref 5–15)
BUN: 16 mg/dL (ref 8–23)
CO2: 23 mmol/L (ref 22–32)
Calcium: 9 mg/dL (ref 8.9–10.3)
Chloride: 101 mmol/L (ref 98–111)
Creatinine, Ser: 0.89 mg/dL (ref 0.61–1.24)
GFR, Estimated: >60 mL/min (ref >60)
Glucose, Bld: 287 mg/dL — ABNORMAL HIGH (ref 70–99)
Potassium: 3.9 mmol/L (ref 3.5–5.1)
Sodium: 134 mmol/L — ABNORMAL LOW (ref 135–145)

## 2020-10-20 LAB — TROPONIN I (HIGH SENSITIVITY)
Troponin I (High Sensitivity): 3 ng/L (ref <18)
Troponin I (High Sensitivity): 11 ng/L (ref <18)

## 2020-10-20 LAB — CBC
HCT: 44 % (ref 39.0–52.0)
Hemoglobin: 14.6 g/dL (ref 13.0–17.0)
MCH: 30.5 pg (ref 26.0–34.0)
MCHC: 33.2 g/dL (ref 30.0–36.0)
MCV: 92.1 fL (ref 80.0–100.0)
Platelets: 241 10*3/uL (ref 150–400)
RBC: 4.78 MIL/uL (ref 4.22–5.81)
RDW: 12 % (ref 11.5–15.5)
WBC: 9.3 10*3/uL (ref 4.0–10.5)
nRBC: 0 % (ref 0.0–0.2)

## 2020-10-20 LAB — RESP PANEL BY RT-PCR (FLU A&B, COVID) ARPGX2
Influenza A by PCR: NEGATIVE
Influenza B by PCR: NEGATIVE
SARS Coronavirus 2 by RT PCR: NEGATIVE

## 2020-10-20 LAB — GLUCOSE, CAPILLARY: Glucose-Capillary: 264 mg/dL — ABNORMAL HIGH (ref 70–99)

## 2020-10-20 MED ORDER — INSULIN ASPART 100 UNIT/ML ~~LOC~~ SOLN
0.0000 [IU] | Freq: Three times a day (TID) | SUBCUTANEOUS | Status: DC
  Administered 2020-10-21: 5 [IU] via SUBCUTANEOUS
  Filled 2020-10-20: qty 0.15

## 2020-10-20 MED ORDER — PIPERACILLIN-TAZOBACTAM 3.375 G IVPB
3.3750 g | Freq: Three times a day (TID) | INTRAVENOUS | Status: DC
  Administered 2020-10-21 (×2): 3.375 g via INTRAVENOUS
  Filled 2020-10-20 (×3): qty 50

## 2020-10-20 MED ORDER — GUAIFENESIN-CODEINE 100-10 MG/5ML PO SOLN
5.0000 mL | Freq: Once | ORAL | Status: AC
  Administered 2020-10-20: 5 mL via ORAL
  Filled 2020-10-20: qty 5

## 2020-10-20 MED ORDER — TRAZODONE HCL 50 MG PO TABS: 25.0000 mg | ORAL_TABLET | Freq: Every evening | ORAL | Status: DC | PRN

## 2020-10-20 MED ORDER — SODIUM CHLORIDE 0.9 % IV BOLUS
1000.0000 mL | Freq: Once | INTRAVENOUS | Status: AC
  Administered 2020-10-20: 1000 mL via INTRAVENOUS

## 2020-10-20 MED ORDER — MORPHINE SULFATE (PF) 2 MG/ML IV SOLN: 1.0000 mg | Freq: Four times a day (QID) | INTRAVENOUS | Status: DC | PRN

## 2020-10-20 MED ORDER — ACETAMINOPHEN 650 MG RE SUPP: 650.0000 mg | Freq: Four times a day (QID) | RECTAL | Status: DC | PRN

## 2020-10-20 MED ORDER — METHYLPREDNISOLONE SODIUM SUCC 125 MG IJ SOLR
60.0000 mg | Freq: Four times a day (QID) | INTRAMUSCULAR | Status: DC
  Administered 2020-10-20 – 2020-10-21 (×3): 60 mg via INTRAVENOUS
  Filled 2020-10-20 (×3): qty 2

## 2020-10-20 MED ORDER — NYSTATIN 100000 UNIT/ML MT SUSP
5.0000 mL | Freq: Four times a day (QID) | OROMUCOSAL | Status: DC
  Administered 2020-10-20 – 2020-10-21 (×3): 500000 [IU] via ORAL
  Filled 2020-10-20 (×3): qty 5

## 2020-10-20 MED ORDER — IPRATROPIUM-ALBUTEROL 0.5-2.5 (3) MG/3ML IN SOLN
3.0000 mL | Freq: Once | RESPIRATORY_TRACT | Status: AC
  Administered 2020-10-20: 3 mL via RESPIRATORY_TRACT
  Filled 2020-10-20: qty 3

## 2020-10-20 MED ORDER — SODIUM CHLORIDE 0.9 % IV SOLN
500.0000 mg | Freq: Once | INTRAVENOUS | Status: AC
  Administered 2020-10-20: 500 mg via INTRAVENOUS
  Filled 2020-10-20: qty 500

## 2020-10-20 MED ORDER — ALBUTEROL SULFATE HFA 108 (90 BASE) MCG/ACT IN AERS: 6.0000 | INHALATION_SPRAY | Freq: Once | RESPIRATORY_TRACT | Status: DC

## 2020-10-20 MED ORDER — ACETAMINOPHEN 325 MG PO TABS
650.0000 mg | ORAL_TABLET | Freq: Once | ORAL | Status: AC
  Administered 2020-10-20: 650 mg via ORAL
  Filled 2020-10-20: qty 2

## 2020-10-20 MED ORDER — SENNOSIDES-DOCUSATE SODIUM 8.6-50 MG PO TABS: 1.0000 | ORAL_TABLET | Freq: Every evening | ORAL | Status: DC | PRN

## 2020-10-20 MED ORDER — SODIUM CHLORIDE 0.9 % IV SOLN
1.0000 g | Freq: Once | INTRAVENOUS | Status: AC
  Administered 2020-10-20: 1 g via INTRAVENOUS
  Filled 2020-10-20: qty 10

## 2020-10-20 MED ORDER — IPRATROPIUM-ALBUTEROL 0.5-2.5 (3) MG/3ML IN SOLN: 3.0000 mL | Freq: Four times a day (QID) | RESPIRATORY_TRACT | Status: DC | PRN

## 2020-10-20 MED ORDER — ACETAMINOPHEN 325 MG PO TABS: 650.0000 mg | ORAL_TABLET | Freq: Four times a day (QID) | ORAL | Status: DC | PRN

## 2020-10-20 MED ORDER — VANCOMYCIN HCL 1500 MG/300ML IV SOLN
1500.0000 mg | Freq: Once | INTRAVENOUS | Status: AC
  Administered 2020-10-20: 1500 mg via INTRAVENOUS
  Filled 2020-10-20: qty 300

## 2020-10-20 MED ORDER — INSULIN ASPART 100 UNIT/ML ~~LOC~~ SOLN
0.0000 [IU] | Freq: Every day | SUBCUTANEOUS | Status: DC
  Administered 2020-10-20: 3 [IU] via SUBCUTANEOUS
  Filled 2020-10-20: qty 0.05

## 2020-10-20 MED ORDER — ENOXAPARIN SODIUM 40 MG/0.4ML ~~LOC~~ SOLN
40.0000 mg | SUBCUTANEOUS | Status: DC
  Administered 2020-10-20: 40 mg via SUBCUTANEOUS
  Filled 2020-10-20: qty 0.4

## 2020-10-20 MED ORDER — IOHEXOL 350 MG/ML SOLN
75.0000 mL | Freq: Once | INTRAVENOUS | Status: AC | PRN
  Administered 2020-10-20: 75 mL via INTRAVENOUS

## 2020-10-20 MED ORDER — ALBUTEROL SULFATE HFA 108 (90 BASE) MCG/ACT IN AERS
6.0000 | INHALATION_SPRAY | Freq: Once | RESPIRATORY_TRACT | Status: AC
  Administered 2020-10-20: 6 via RESPIRATORY_TRACT
  Filled 2020-10-20: qty 6.7

## 2020-10-20 MED ORDER — BISACODYL 5 MG PO TBEC: 5.0000 mg | DELAYED_RELEASE_TABLET | Freq: Every day | ORAL | Status: DC | PRN

## 2020-10-20 MED ORDER — MAGNESIUM CITRATE PO SOLN: 1.0000 | Freq: Once | ORAL | Status: DC | PRN

## 2020-10-20 MED ORDER — ONDANSETRON HCL 4 MG PO TABS: 4.0000 mg | ORAL_TABLET | Freq: Four times a day (QID) | ORAL | Status: DC | PRN

## 2020-10-20 MED ORDER — AEROCHAMBER Z-STAT PLUS/MEDIUM MISC
1.0000 | Freq: Once | Status: AC
  Administered 2020-10-20: 1
  Filled 2020-10-20: qty 1

## 2020-10-20 MED ORDER — VANCOMYCIN HCL 1000 MG/200ML IV SOLN
1000.0000 mg | Freq: Two times a day (BID) | INTRAVENOUS | Status: DC
  Administered 2020-10-21: 1000 mg via INTRAVENOUS
  Filled 2020-10-20: qty 200

## 2020-10-20 MED ORDER — FLUCONAZOLE 150 MG PO TABS
150.0000 mg | ORAL_TABLET | Freq: Once | ORAL | Status: AC
  Administered 2020-10-20: 150 mg via ORAL
  Filled 2020-10-20: qty 1

## 2020-10-20 MED ORDER — VANCOMYCIN HCL IN DEXTROSE 1-5 GM/200ML-% IV SOLN
1000.0000 mg | Freq: Two times a day (BID) | INTRAVENOUS | Status: DC
  Filled 2020-10-20: qty 200

## 2020-10-20 MED ORDER — ONDANSETRON HCL 4 MG/2ML IJ SOLN: 4.0000 mg | Freq: Four times a day (QID) | INTRAMUSCULAR | Status: DC | PRN

## 2020-10-20 MED ORDER — METHYLPREDNISOLONE SODIUM SUCC 125 MG IJ SOLR
125.0000 mg | Freq: Once | INTRAMUSCULAR | Status: AC
  Administered 2020-10-20: 125 mg via INTRAVENOUS
  Filled 2020-10-20: qty 2

## 2020-10-20 MED ORDER — TRAMADOL HCL 50 MG PO TABS: 50.0000 mg | ORAL_TABLET | Freq: Four times a day (QID) | ORAL | Status: DC | PRN

## 2020-10-20 NOTE — ED Notes (Signed)
Pt ambulated around nurses station with pulse ox on room air. Pt O2 sats stayed between 90-92%. Pt did report SOB while ambulating. PA made aware.

## 2020-10-20 NOTE — Progress Notes (Signed)
Received patient from ED at 2018 via stretcher. Patient is AAOX4. On RA, breathing is even and effortless. Vitals are WNL's. Denies pain or discomfort at this time. Patient was oriented to room, call bell placed within reach.

## 2020-10-20 NOTE — Progress Notes (Signed)
Pharmacy Antibiotic Note  Duane Cuevas is a 65 y.o. male with hx of COPD presented to the ED on 10/20/2020 with c/o SOB and cough.  Chest CT showed findings with concern for infection.  Pharmacy has been consulted to dose vancomycin and zosyn for PNA.   Plan: - vancomycin 1500 mg IV x1, then 1000 mg IV q12h for est AUC 516 - zosyn 3.375 gm IV q8h (infuse over 4 hrs) - daily scr  _______________________________________________  Temp (24hrs), Avg:98.6 F (37 C), Min:98.6 F (37 C), Max:98.6 F (37 C)  Recent Labs  Lab 10/20/20 1219  WBC 9.3  CREATININE 0.89    Estimated Creatinine Clearance: 80.1 mL/min (by C-G formula based on SCr of 0.89 mg/dL).    No Known Allergies   Thank you for allowing pharmacy to be a part of this patient's care.  Lynelle Doctor 10/20/2020 7:42 PM

## 2020-10-20 NOTE — H&P (Signed)
History and Physical   [facility] [place] [facility] Admission History and Physical [employer], D.O.    Patient Name: Duane Cuevas MR#: [id number] Date of Birth: [birth year] Date of Admission: 10/20/2020  Referring MD/NP/PA: Coral Ceo Primary Care Physician: [name], MD  Chief Complaint:  Chief Complaint  Patient presents with  . Hemoptysis  . Chest Pain  . Shortness of Breath    HPI: Duane Cuevas is a 65 y.o. male with a known history of COPD, diabetes, chronic thrombocytopenia presents to the emergency department for evaluation of chest pain and shortness of breath.  Patient was in a usual state of health until 4 days ago when he reports the onset of progressively worsening cough associated with chest pain and shortness of breath.  He has associated fevers, chills, congestion and rhinorrhea..   Otherwise there has been no change in status. Patient has been taking medication as prescribed and there has been no recent change in medication or diet.  No recent antibiotics.  There has been no recent illness, hospitalizations, travel or sick contacts.    EMS/ED Course: Patient received Medrol, DuoNeb, Rocephin and azithromycin. Medical admission has been requested for further management of HCAP, COPD exacerbation.  Review of Systems:  CONSTITUTIONAL: No fever/chills, fatigue, weakness, weight gain/loss, headache. EYES: No blurry or double vision. ENT: No tinnitus, postnasal drip, redness or soreness of the oropharynx. RESPIRATORY: Positive cough, dyspnea, wheeze.  No hemoptysis.  CARDIOVASCULAR: Positive chest pain, negative palpitations, syncope, orthopnea. No lower extremity edema.  GASTROINTESTINAL: No nausea, vomiting, abdominal pain, diarrhea, constipation.  No hematemesis, melena or hematochezia. GENITOURINARY: No dysuria, frequency, hematuria. ENDOCRINE: No polyuria or nocturia. No heat or cold intolerance. HEMATOLOGY: No  anemia, bruising, bleeding. INTEGUMENTARY: No rashes, ulcers, lesions. MUSCULOSKELETAL: No arthritis, gout, dyspnea. NEUROLOGIC: No numbness, tingling, ataxia, seizure-type activity, weakness. PSYCHIATRIC: No anxiety, depression, insomnia.   Past Medical History:  Diagnosis Date  . Diabetes mellitus ([place]) 10/2014   pt denies being diabetic.   . Emphysema of lung ([place])   . Emphysema/COPD ([place]) 10/2014  . Hepatic steatosis   . Thrombocytopenia ([place]) 09/2015   platelets in 120s.     Past Surgical History:  Procedure Laterality Date  . BIOPSY  09/16/2020   Procedure: BIOPSY;  Surgeon: Thornton Park, MD;  Location: [facility];  Service: Gastroenterology;;  . ESOPHAGOGASTRODUODENOSCOPY N/A 09/23/2015   Procedure: ESOPHAGOGASTRODUODENOSCOPY (EGD);  Surgeon: Ladene Artist, MD;  Location: [facility];  Service: Endoscopy;  Laterality: N/A;  . ESOPHAGOGASTRODUODENOSCOPY (EGD) WITH PROPOFOL N/A 09/16/2020   Procedure: ESOPHAGOGASTRODUODENOSCOPY (EGD) WITH PROPOFOL;  Surgeon: Thornton Park, MD;  Location: [facility];  Service: Gastroenterology;  Laterality: N/A;  . FOOT SURGERY    . INGUINAL HERNIA REPAIR Left 05/15/2018   Procedure: LEFT INGUINAL HERNIA REPAIR WITH MESH;  Surgeon: Erroll Luna, MD;  Location: [facility];  Service: General;  Laterality: Left;  . INSERTION OF MESH Left 05/15/2018   Procedure: INSERTION OF MESH;  Surgeon: Erroll Luna, MD;  Location: [facility];  Service: General;  Laterality: Left;  . UPPER GASTROINTESTINAL ENDOSCOPY       reports that he has been smoking cigarettes. He has a 7.05 pack-year smoking history. He has never used smokeless tobacco. He reports current alcohol use. He reports that he does not use drugs.  No Known Allergies  Family History  Problem Relation Age of Onset  . Emphysema Mother   . Diabetes Mother   . Emphysema Father   . Cancer Sister  Stomach cancer  . Diabetes Sister   . Stomach cancer Sister   . Diabetes Brother    . Colon cancer Neg Hx   . Colon polyps Neg Hx   . Esophageal cancer Neg Hx   . Rectal cancer Neg Hx     Prior to Admission medications   Medication Sig Start Date End Date Taking? Authorizing Provider  acetaminophen (TYLENOL) 500 MG tablet Take 1,000 mg by mouth every 6 (six) hours as needed for moderate pain or headache.    [provider]  albuterol (PROVENTIL) (2.5 MG/3ML) 0.083% nebulizer solution TAKE 3 ML(2.5 MG TOTAL) BY NEBULIZATION EVERY 6(SIX) HOURS AS NEEDED Patient taking differently: Take 2.5 mg by nebulization every 6 (six) hours as needed for wheezing or shortness of breath. 08/05/20   [name], MD  amLODipine (NORVASC) 5 MG tablet Take 1 tablet (5 mg total) by mouth daily. 08/21/20   Maximiano Coss, NP  aspirin EC 81 MG EC tablet Take 1 tablet (81 mg total) by mouth daily. 11/03/19   Annita Brod, MD  atorvastatin (LIPITOR) 20 MG tablet Take 1 tablet (20 mg total) by mouth daily. 06/22/20   [name], MD  blood glucose meter kit and supplies KIT Dispense based on patient and insurance preference. Use up to four times daily as directed. 09/21/20   [name], MD  budesonide-formoterol [facility]) 160-4.5 MCG/ACT inhaler Inhale 2 puffs into the lungs 2 (two) times daily. 06/22/20   [name], MD  glipiZIDE (GLUCOTROL) 10 MG tablet Take 1 tablet (10 mg total) by mouth 2 (two) times daily before a meal. 08/21/20   Maximiano Coss, NP  glucose blood (ACCU-CHEK GUIDE) test strip Use as instructed 10/19/20   [name], MD  insulin glargine (LANTUS) 100 UNIT/ML Solostar Pen Inject 20 Units into the skin daily. 09/21/20 12/20/20  [name], MD  insulin lispro (HUMALOG) 100 UNIT/ML KwikPen Inject 3 Units into the skin 3 (three) times daily. Adjust amount of insulin as per sliding scale. 09/21/20 12/20/20  [name], MD  Insulin Pen Needle 32G X 4 MM MISC USE TO INJECT LANTUS AND HUMALOG 4 TIMES DAILY  09/16/20 09/16/21  Terrilee Croak, MD  metFORMIN (GLUCOPHAGE) 1000 MG tablet Take 1 tablet (1,000 mg total) by mouth 2 (two) times daily with a meal. 06/22/20 09/20/20  [name], MD  pantoprazole (PROTONIX) 40 MG tablet TAKE 1 TABLET BY MOUTH 2 TIMES DAILY BEFORE MEALS 09/16/20 09/16/21  Terrilee Croak, MD    Physical Exam: Vitals:   10/20/20 1530 10/20/20 1630 10/20/20 1730 10/20/20 1815  BP: 117/72 123/66 101/68 111/76  Pulse: (!) 104 (!) 102 100 97  Resp: 20 (!) _0 Temp:      TempSrc:      SpO2: 92% 93% 93% 91%    GENERAL: 65 y.o.-year-old panic male patient, well-developed, well-nourished lying in the bed in no acute distress.  Pleasant and cooperative.   HEENT: Head atraumatic, normocephalic. Pupils equal. Mucus membranes moist. NECK: Supple. No JVD. CHEST: Mild diffuse wheezing. No use of accessory muscles of respiration.  No reproducible chest wall tenderness.  CARDIOVASCULAR: S1, S2 normal. No murmurs, rubs, or gallops. Cap refill <2 seconds. Pulses intact distally.  ABDOMEN: Soft, nondistended, nontender. No rebound, guarding, rigidity. Normoactive bowel sounds present in all four quadrants.  EXTREMITIES: No pedal edema, cyanosis, or clubbing. No calf tenderness or Homan's sign.  NEUROLOGIC: The patient is alert and oriented x 3.  Cranial nerves II through XII are grossly intact with no focal sensorimotor deficit. PSYCHIATRIC:  Normal affect, mood, thought content. SKIN: Warm, dry, and intact without obvious rash, lesion, or ulcer.    Labs on Admission:  CBC: Recent Labs  Lab 10/20/20 1219  WBC 9.3  HGB 14.6  HCT 44.0  MCV 92.1  PLT 378   Basic Metabolic Panel: Recent Labs  Lab 10/20/20 1219  NA 134*  K 3.9  CL 101  CO2 23  GLUCOSE 287*  BUN 16  CREATININE 0.89  CALCIUM 9.0   GFR: Estimated Creatinine Clearance: 80.1 mL/min (by C-G formula based on SCr of 0.89 mg/dL). Liver Function Tests: No results for input(s): AST, ALT, ALKPHOS,  BILITOT, PROT, ALBUMIN in the last 168 hours. No results for input(s): LIPASE, AMYLASE in the last 168 hours. No results for input(s): AMMONIA in the last 168 hours. Coagulation Profile: No results for input(s): INR, PROTIME in the last 168 hours. Cardiac Enzymes: No results for input(s): CKTOTAL, CKMB, CKMBINDEX, TROPONINI in the last 168 hours. BNP (last 3 results) No results for input(s): PROBNP in the last 8760 hours. HbA1C: No results for input(s): HGBA1C in the last 72 hours. CBG: No results for input(s): GLUCAP in the last 168 hours. Lipid Profile: No results for input(s): CHOL, HDL, LDLCALC, TRIG, CHOLHDL, LDLDIRECT in the last 72 hours. Thyroid Function Tests: No results for input(s): TSH, T4TOTAL, FREET4, T3FREE, THYROIDAB in the last 72 hours. Anemia Panel: No results for input(s): VITAMINB12, FOLATE, FERRITIN, TIBC, IRON, RETICCTPCT in the last 72 hours. Urine analysis:    Component Value Date/Time   COLORURINE STRAW (A) 09/13/2020 2130   APPEARANCEUR CLEAR 09/13/2020 2130   LABSPEC 1.022 09/13/2020 2130   PHURINE 5.0 09/13/2020 2130   GLUCOSEU >=500 (A) 09/13/2020 2130   HGBUR NEGATIVE 09/13/2020 2130   BILIRUBINUR NEGATIVE 09/13/2020 2130   KETONESUR 5 (A) 09/13/2020 2130   PROTEINUR NEGATIVE 09/13/2020 2130   UROBILINOGEN 0.2 10/14/2014 1712   NITRITE NEGATIVE 09/13/2020 2130   LEUKOCYTESUR NEGATIVE 09/13/2020 2130   Sepsis Labs: _0 (procalcitonin:4,lacticidven:4) ) Recent Results (from the past 240 hour(s))  Resp Panel by RT-PCR (Flu A&B, Covid) Nasopharyngeal Swab     Status: None   Collection Time: 10/20/20  3:48 PM   Specimen: Nasopharyngeal Swab; Nasopharyngeal(NP) swabs in vial transport medium  Result Value Ref Range Status   SARS Coronavirus 2 by RT PCR NEGATIVE NEGATIVE Final    Comment: (NOTE) SARS-CoV-2 target nucleic acids are NOT DETECTED.  The SARS-CoV-2 RNA is generally detectable in upper respiratory specimens during the acute  phase of infection. The lowest concentration of SARS-CoV-2 viral copies this assay can detect is 138 copies/mL. A negative result does not preclude SARS-Cov-2 infection and should not be used as the sole basis for treatment or other patient management decisions. A negative result may occur with  improper specimen collection/handling, submission of specimen other than nasopharyngeal swab, presence of viral mutation(s) within the areas targeted by this assay, and inadequate number of viral copies(<138 copies/mL). A negative result must be combined with clinical observations, patient history, and epidemiological information. The expected result is Negative.  Fact Sheet for Patients:  EntrepreneurPulse.com.au  Fact Sheet for Healthcare Providers:  IncredibleEmployment.be  This test is no t yet approved or cleared by the [place] FDA and  has been authorized for detection and/or diagnosis of SARS-CoV-2 by FDA under an Emergency Use Authorization (EUA). This EUA will remain  in effect (meaning this test can be used) for  the duration of the COVID-19 declaration under Section 564(b)(1) of the Act, 21 U.S.C.section 360bbb-3(b)(1), unless the authorization is terminated  or revoked sooner.       Influenza A by PCR NEGATIVE NEGATIVE Final   Influenza B by PCR NEGATIVE NEGATIVE Final    Comment: (NOTE) The Xpert Xpress SARS-CoV-2/FLU/RSV plus assay is intended as an aid in the diagnosis of influenza from Nasopharyngeal swab specimens and should not be used as a sole basis for treatment. Nasal washings and aspirates are unacceptable for Xpert Xpress SARS-CoV-2/FLU/RSV testing.  Fact Sheet for Patients: EntrepreneurPulse.com.au  Fact Sheet for Healthcare Providers: IncredibleEmployment.be  This test is not yet approved or cleared by the [place] FDA and has been authorized for detection and/or diagnosis of  SARS-CoV-2 by FDA under an Emergency Use Authorization (EUA). This EUA will remain in effect (meaning this test can be used) for the duration of the COVID-19 declaration under Section 564(b)(1) of the Act, 21 U.S.C. section 360bbb-3(b)(1), unless the authorization is terminated or revoked.  Performed at [facility], [address] [place], [place] [address]      Radiological Exams on Admission: DG Chest 2 View  Result Date: 10/20/2020 CLINICAL DATA:  Cough. EXAM: CHEST - 2 VIEW COMPARISON:  09/13/2020. FINDINGS: Mediastinum and hilar structures normal. Mild peribronchial cuffing. Minimal infiltrate right mid lung cannot be excluded. No pleural effusion or pneumothorax. Heart size normal. No acute bony abnormality. IMPRESSION: Mild peribronchial cuffing suggesting bronchitis. Minimal infiltrate right mid lung cannot be excluded. Electronically Signed   By: Marcello Moores  Register   On: 10/20/2020 12:46   CT Angio Chest PE W and/or Wo Contrast  Result Date: 10/20/2020 CLINICAL DATA:  Cough, chest pain and hemoptysis for 4 days. EXAM: CT ANGIOGRAPHY CHEST WITH CONTRAST TECHNIQUE: Multidetector CT imaging of the chest was performed using the standard protocol during bolus administration of intravenous contrast. Multiplanar CT image reconstructions and MIPs were obtained to evaluate the vascular anatomy. CONTRAST:  83m OMNIPAQUE IOHEXOL 350 MG/ML SOLN COMPARISON:  Cardiac CT 09/15/2020 FINDINGS: Cardiovascular: The heart is normal in size. No pericardial effusion. The aorta is normal in caliber. No dissection. The contrast is coming in through the left subclavian vein and the patient has a duplicated SVC. The contrast comes down the left-sided SVC and into the right atrium. The pulmonary arterial tree and is well opacified. No filling defects to suggest pulmonary embolism. Mediastinum/Nodes: Stable scattered mediastinal and hilar lymph nodes but no mass or overt adenopathy. Findings  likely due to the underlying lung disease. The esophagus is grossly normal. Lungs/Pleura: Underlining significant emphysematous changes with apical bulla and associated pulmonary scarring changes. Areas of moderate bronchial wall thickening most notably in the lower lobes bilaterally likely reflecting bronchitis. No focal infiltrates or pulmonary edema. Some patchy areas of tree-in-bud type findings most notably in the superior segment of the right lower lobe suggesting chronic inflammation or atypical infection. No pleural effusions. No worrisome pulmonary lesions or pulmonary nodules. Upper Abdomen: No significant upper abdominal findings. Musculoskeletal: No significant bony findings. Review of the MIP images confirms the above findings. IMPRESSION: 1. No CT findings for pulmonary embolism. 2. Normal thoracic aorta. 3. Duplicated SVC. 4. Underlying significant emphysematous changes and pulmonary scarring changes. 5. Bronchial wall thickening suggesting bronchitis and patchy areas of tree-in-bud type findings most notably in the superior segment of the right lower lobe suggesting chronic inflammation or atypical infection. Emphysema (ICD10-J43.9). Electronically Signed   By: PMarijo SanesM.D.   On: 10/20/2020  17:06    EKG: Pending  Assessment/Plan  This is a 65 y.o. male with a history of COPD, diabetes, chronic thrombocytopenia, hypertension, hyperlipidemia, GERD now being admitted with:  #. Healthcare Associated Pneumonia - Admit to inpatient - IV Zosyn & Vancomycin - IV fluid hydration - Duonebs, expectorants & O2 therapy as needed - Follow up blood & sputum cultures  #. Acute exacerbation of COPD - IV steroids  - Nebulizers, O2 therapy and expectorants as needed.  - Continuous pulse oximetry - Consider pulmonary consult if not improving.  -Continue Symbicort  #. H/o Diabetes - Accuchecks achs with RISS coverage - Heart healthy, carb controlled diet -Hold glipizide  #. History of  GERD - Continue Protonix  #. History of hypertension - Continue Norvasc  #. History of hyperlipidemia - Continue Lipitor  Admission status: Inpatient IV Fluids: NS Diet/Nutrition: Heart healthy, carb controlled Consults called: None DVT Px: Lovenox, SCDs and early ambulation. Code Status: Full Code  Disposition Plan: To home in 1-2 days  All the records are reviewed and case discussed with ED provider. Management plans discussed with the patient and/or family who express understanding and agree with plan of care.  Christin Moline D.O. on 10/20/2020 at 7:07 PM CC: Primary care physician; [name], MD   10/20/2020, 7:07 PM

## 2020-10-20 NOTE — ED Provider Notes (Signed)
[facility] DEPT Provider Note   CSN: [id number] Arrival date & time: 10/20/20  1203     History Chief Complaint  Patient presents with  . Hemoptysis  . Chest Pain  . Shortness of Breath    Du[facility] Cuevas is a 65 y.o. male.  HPI   Pt is a 65 y/o male with a h/o DM, COPD, hepatic steatosis, thrombocytopenia, who presents to the ED today for eval of chest pain, headache, sore throat that started about 4 days. He attributes these sxs to a persistent cough that he has had. He further reports hemoptysis, congestion, rhinorrhea, and chills.  Was recently admitted. Denies leg pain/swelling, hemoptysis, recent surgery, recent long travel, hormone use, personal hx of cancer, or hx of DVT/PE.    Past Medical History:  Diagnosis Date  . Diabetes mellitus ([place]) 10/2014   pt denies being diabetic.   . Emphysema of lung ([place])   . Emphysema/COPD ([place]) 10/2014  . Hepatic steatosis   . Thrombocytopenia ([place]) 09/2015   platelets in 120s.     Patient Active Problem List   Diagnosis Date Noted  . COPD exacerbation ([place]) 10/20/2020  . Essential hypertension 09/21/2020  . Acute esophagitis   . Acute gastric erosion   . Other dysphagia   . Hyperglycemia 09/13/2020  . Uncontrolled type 2 diabetes mellitus with hyperglycemia ([place]) 09/13/2020  . Musculoskeletal chest pain 04/29/2020  . CAD (coronary artery disease) 01/23/2020  . Hyperlipidemia 11/02/2019  . History of MI (myocardial infarction) 11/02/2019  . Cigarette smoker 08/15/2018  . COPD ? GOLD III/ active smoker 08/14/2018  . History of diet-controlled diabetes 06/30/2018  . COPD (chronic obstructive pulmonary disease) ([place]) 10/14/2014  . Type 2 diabetes mellitus with hyperglycemia, without long-term current use of insulin ([place]) 10/14/2014    Past Surgical History:  Procedure Laterality Date  . BIOPSY  09/16/2020   Procedure: BIOPSY;  Surgeon: Thornton Park, MD;  Location: [facility];   Service: Gastroenterology;;  . ESOPHAGOGASTRODUODENOSCOPY N/A 09/23/2015   Procedure: ESOPHAGOGASTRODUODENOSCOPY (EGD);  Surgeon: Ladene Artist, MD;  Location: [facility];  Service: Endoscopy;  Laterality: N/A;  . ESOPHAGOGASTRODUODENOSCOPY (EGD) WITH PROPOFOL N/A 09/16/2020   Procedure: ESOPHAGOGASTRODUODENOSCOPY (EGD) WITH PROPOFOL;  Surgeon: Thornton Park, MD;  Location: [facility];  Service: Gastroenterology;  Laterality: N/A;  . FOOT SURGERY    . INGUINAL HERNIA REPAIR Left 05/15/2018   Procedure: LEFT INGUINAL HERNIA REPAIR WITH MESH;  Surgeon: Erroll Luna, MD;  Location: [facility];  Service: General;  Laterality: Left;  . INSERTION OF MESH Left 05/15/2018   Procedure: INSERTION OF MESH;  Surgeon: Erroll Luna, MD;  Location: [facility];  Service: General;  Laterality: Left;  . UPPER GASTROINTESTINAL ENDOSCOPY         Family History  Problem Relation Age of Onset  . Emphysema Mother   . Diabetes Mother   . Emphysema Father   . Cancer Sister        Stomach cancer  . Diabetes Sister   . Stomach cancer Sister   . Diabetes Brother   . Colon cancer Neg Hx   . Colon polyps Neg Hx   . Esophageal cancer Neg Hx   . Rectal cancer Neg Hx     Social History   Tobacco Use  . Smoking status: Current Every Day Smoker    Packs/day: 0.15    Years: 47.00    Pack years: 7.05    Types: Cigarettes  . Smokeless tobacco: Never Used  Vaping Use  .  Vaping Use: Former  Substance Use Topics  . Alcohol use: Yes    Comment: occ  . Drug use: No    Home Medications Prior to Admission medications   Medication Sig Start Date End Date Taking? Authorizing Provider  acetaminophen (TYLENOL) 500 MG tablet Take 1,000 mg by mouth every 6 (six) hours as needed for moderate pain or headache.    [provider]  albuterol (PROVENTIL) (2.5 MG/3ML) 0.083% nebulizer solution TAKE 3 ML(2.5 MG TOTAL) BY NEBULIZATION EVERY 6(SIX) HOURS AS NEEDED Patient taking differently: Take 2.5 mg by  nebulization every 6 (six) hours as needed for wheezing or shortness of breath. 08/05/20   [name], MD  amLODipine (NORVASC) 5 MG tablet Take 1 tablet (5 mg total) by mouth daily. 08/21/20   Maximiano Coss, NP  aspirin EC 81 MG EC tablet Take 1 tablet (81 mg total) by mouth daily. 11/03/19   Annita Brod, MD  atorvastatin (LIPITOR) 20 MG tablet Take 1 tablet (20 mg total) by mouth daily. 06/22/20   [name], MD  blood glucose meter kit and supplies KIT Dispense based on patient and insurance preference. Use up to four times daily as directed. 09/21/20   [name], MD  budesonide-formoterol [facility]) 160-4.5 MCG/ACT inhaler Inhale 2 puffs into the lungs 2 (two) times daily. 06/22/20   [name], MD  glipiZIDE (GLUCOTROL) 10 MG tablet Take 1 tablet (10 mg total) by mouth 2 (two) times daily before a meal. 08/21/20   Maximiano Coss, NP  glucose blood (ACCU-CHEK GUIDE) test strip Use as instructed 10/19/20   [name], MD  insulin glargine (LANTUS) 100 UNIT/ML Solostar Pen Inject 20 Units into the skin daily. 09/21/20 12/20/20  [name], MD  insulin lispro (HUMALOG) 100 UNIT/ML KwikPen Inject 3 Units into the skin 3 (three) times daily. Adjust amount of insulin as per sliding scale. 09/21/20 12/20/20  [name], MD  Insulin Pen Needle 32G X 4 MM MISC USE TO INJECT LANTUS AND HUMALOG 4 TIMES DAILY 09/16/20 09/16/21  Terrilee Croak, MD  metFORMIN (GLUCOPHAGE) 1000 MG tablet Take 1 tablet (1,000 mg total) by mouth 2 (two) times daily with a meal. 06/22/20 09/20/20  [name], MD  pantoprazole (PROTONIX) 40 MG tablet TAKE 1 TABLET BY MOUTH 2 TIMES DAILY BEFORE MEALS 09/16/20 09/16/21  Terrilee Croak, MD    Allergies    Patient has no known allergies.  Review of Systems   Review of Systems  Constitutional: Negative for chills and fever.  HENT: Positive for sore throat. Negative for ear pain.   Eyes: Negative for  pain and visual disturbance.  Respiratory: Positive for cough and shortness of breath.   Cardiovascular: Negative for chest pain and leg swelling.  Gastrointestinal: Negative for abdominal pain, constipation, diarrhea, nausea and vomiting.  Genitourinary: Negative for dysuria and hematuria.  Musculoskeletal: Negative for back pain.  Skin: Negative for color change and rash.  Neurological: Positive for headaches.  All other systems reviewed and are negative.   Physical Exam Updated Vital Signs BP 112/72   Pulse 100   Temp 98.6 F (37 C) (Oral)   Resp 16   SpO2 90%   Physical Exam Vitals and nursing note reviewed.  Constitutional:      Appearance: He is well-developed.  HENT:     Head: Normocephalic and atraumatic.     Mouth/Throat:     Comments: White patches noted to the posterior oropharynx consistent with candida  Eyes:     Conjunctiva/sclera: Conjunctivae normal.  Cardiovascular:     Rate and Rhythm: Regular rhythm. Tachycardia present.     Heart sounds: No murmur heard.   Pulmonary:     Effort: Pulmonary effort is normal. No respiratory distress.     Breath sounds: Wheezing present.  Abdominal:     General: Bowel sounds are normal.     Palpations: Abdomen is soft.     Tenderness: There is no abdominal tenderness. There is no guarding or rebound.  Musculoskeletal:        General: No tenderness.     Cervical back: Neck supple.     Right lower leg: No tenderness. No edema.     Left lower leg: No tenderness. No edema.  Skin:    General: Skin is warm and dry.  Neurological:     Mental Status: He is alert.     ED Results / Procedures / Treatments   Labs (all labs ordered are listed, but only abnormal results are displayed) Labs Reviewed  BASIC METABOLIC PANEL - Abnormal; Notable for the following components:      Result Value   Sodium 134 (*)    Glucose, Bld 287 (*)    All other components within normal limits  RESP PANEL BY RT-PCR (FLU A&B, COVID) [id number]   CBC  TROPONIN I (HIGH SENSITIVITY)  TROPONIN I (HIGH SENSITIVITY)    EKG None  Radiology DG Chest 2 View  Result Date: 10/20/2020 CLINICAL DATA:  Cough. EXAM: CHEST - 2 VIEW COMPARISON:  09/13/2020. FINDINGS: Mediastinum and hilar structures normal. Mild peribronchial cuffing. Minimal infiltrate right mid lung cannot be excluded. No pleural effusion or pneumothorax. Heart size normal. No acute bony abnormality. IMPRESSION: Mild peribronchial cuffing suggesting bronchitis. Minimal infiltrate right mid lung cannot be excluded. Electronically Signed   By: Marcello Moores  Register   On: 10/20/2020 12:46   CT Angio Chest PE W and/or Wo Contrast  Result Date: 10/20/2020 CLINICAL DATA:  Cough, chest pain and hemoptysis for 4 days. EXAM: CT ANGIOGRAPHY CHEST WITH CONTRAST TECHNIQUE: Multidetector CT imaging of the chest was performed using the standard protocol during bolus administration of intravenous contrast. Multiplanar CT image reconstructions and MIPs were obtained to evaluate the vascular anatomy. CONTRAST:  61mL OMNIPAQUE IOHEXOL 350 MG/ML SOLN COMPARISON:  Cardiac CT 09/15/2020 FINDINGS: Cardiovascular: The heart is normal in size. No pericardial effusion. The aorta is normal in caliber. No dissection. The contrast is coming in through the left subclavian vein and the patient has a duplicated SVC. The contrast comes down the left-sided SVC and into the right atrium. The pulmonary arterial tree and is well opacified. No filling defects to suggest pulmonary embolism. Mediastinum/Nodes: Stable scattered mediastinal and hilar lymph nodes but no mass or overt adenopathy. Findings likely due to the underlying lung disease. The esophagus is grossly normal. Lungs/Pleura: Underlining significant emphysematous changes with apical bulla and associated pulmonary scarring changes. Areas of moderate bronchial wall thickening most notably in the lower lobes bilaterally likely reflecting bronchitis. No focal infiltrates  or pulmonary edema. Some patchy areas of tree-in-bud type findings most notably in the superior segment of the right lower lobe suggesting chronic inflammation or atypical infection. No pleural effusions. No worrisome pulmonary lesions or pulmonary nodules. Upper Abdomen: No significant upper abdominal findings. Musculoskeletal: No significant bony findings. Review of the MIP images confirms the above findings. IMPRESSION: 1. No CT findings for pulmonary embolism. 2. Normal thoracic aorta. 3. Duplicated SVC. 4. Underlying significant emphysematous changes and  pulmonary scarring changes. 5. Bronchial wall thickening suggesting bronchitis and patchy areas of tree-in-bud type findings most notably in the superior segment of the right lower lobe suggesting chronic inflammation or atypical infection. Emphysema (ICD10-J43.9). Electronically Signed   By: Marijo Sanes M.D.   On: 10/20/2020 17:06    Procedures Procedures   Medications Ordered in ED Medications  nystatin (MYCOSTATIN) 100000 UNIT/ML suspension 500,000 Units (500,000 Units Oral Given 10/20/20 1824)  piperacillin-tazobactam (ZOSYN) IVPB 3.375 g (has no administration in time range)  fluconazole (DIFLUCAN) tablet 150 mg (150 mg Oral Given 10/20/20 1542)  methylPREDNISolone sodium succinate (SOLU-MEDROL) 125 mg/2 mL injection 125 mg (125 mg Intravenous Given 10/20/20 1542)  albuterol (VENTOLIN HFA) 108 (90 Base) MCG/ACT inhaler 6 puff (6 puffs Inhalation Given 10/20/20 1542)  aerochamber Z-Stat Plus/medium 1 each (1 each Other Given 10/20/20 1547)  sodium chloride 0.9 % bolus 1,000 mL (0 mLs Intravenous Stopped 10/20/20 1745)  acetaminophen (TYLENOL) tablet 650 mg (650 mg Oral Given 10/20/20 1542)  cefTRIAXone (ROCEPHIN) 1 g in sodium chloride 0.9 % 100 mL IVPB (0 g Intravenous Stopped 10/20/20 1617)  azithromycin (ZITHROMAX) 500 mg in sodium chloride 0.9 % 250 mL IVPB (0 mg Intravenous Stopped 10/20/20 1744)  iohexol (OMNIPAQUE) 350 MG/ML injection 75  mL (75 mLs Intravenous Contrast Given 10/20/20 1632)  guaiFENesin-codeine 100-10 MG/5ML solution 5 mL (5 mLs Oral Given 10/20/20 1740)  ipratropium-albuterol (DUONEB) 0.5-2.5 (3) MG/3ML nebulizer solution 3 mL (3 mLs Nebulization Given 10/20/20 1742)  ipratropium-albuterol (DUONEB) 0.5-2.5 (3) MG/3ML nebulizer solution 3 mL (3 mLs Nebulization Given 10/20/20 1740)    ED Course  I have reviewed the triage vital signs and the nursing notes.  Pertinent labs & imaging results that were available during my care of the patient were reviewed by me and considered in my medical decision making (see chart for details).    MDM Rules/Calculators/A&P                          65 year old male presenting the emergency department today for evaluation of shortness of breath and cough.  Has history of COPD.  He is also complaining of some chest tightness and chest pain.  Was recently admitted for COPD.  Reviewed/interpreted labs CBC is unremarkable, BMP with elevated blood glucose but otherwise reassuring.  Delta troponins are negative.  COVID/flu negative.  EKG with sinus tachycardia and biatrial enlargement  Reviewed/interpreted imaging Chest x-ray - Mild peribronchial cuffing suggesting bronchitis. Minimal infiltrate right mid lung cannot be excluded.  CTA chest - IMPRESSION: 1. No CT findings for pulmonary embolism. 2. Normal thoracic aorta. 3. Duplicated SVC. 4. Underlying significant emphysematous changes and pulmonary scarring changes. 5. Bronchial wall thickening suggesting bronchitis and patchy areas of tree-in-bud type findings most notably in the superior segment of the right lower lobe suggesting chronic inflammation or atypical infection. Emphysema   65 year old male history of COPD presenting for evaluation of shortness of breath and cough.  Lungs are wheezy bilaterally.  Symptoms unresolved with multiple nebulizer treatments, Solu-Medrol and cough medication.  Sats will not stay above 90 with  ambulation and patient feels very dyspneic.  Will admit for COPD exacerbation and questionable pneumonia on chest x-ray.  He is also received antibiotics  6:50 PM CONSULT with Dr. Ara Kussmaul who accepts patient for admission   Final Clinical Impression(s) / ED Diagnoses Final diagnoses:  COPD exacerbation ([place])  Community acquired pneumonia of right lower lobe of lung    Rx / DC  Orders ED Discharge Orders    None       Bishop [place] 10/20/20 1940    Milton Ferguson, MD 10/20/20 2129

## 2020-10-20 NOTE — ED Triage Notes (Signed)
Patient here from home reporting cough, chest tightness, and blood in sputum x4 days. Hx of COPD . Denies Recent travel. Reports he did receive covid vaccinations with 1 booster.

## 2020-10-20 NOTE — ED Notes (Signed)
ED TO INPATIENT HANDOFF REPORT  Name/Age/Gender Duane Cuevas 65 y.o. male  Code Status Code Status History    Date Active Date Inactive Code Status Order ID Comments User Context   09/13/2020 2306 09/16/2020 2058 Full Code [id number]  Rise Patience, MD ED   04/27/2020 2345 04/30/2020 1640 Full Code [id number]  Kayleen Memos, DO ED   01/23/2020 0146 01/26/2020 1814 Full Code [id number]  Reubin Milan, MD ED   11/01/2019 1618 11/02/2019 2254 Full Code [id number]  Flora Lipps, MD ED   06/30/2018 2010 07/02/2018 1742 Full Code [id number]  Lenore Cordia, MD ED   09/23/2015 1149 09/25/2015 1624 Full Code [id number]  Rama, Venetia Maxon, MD Inpatient   09/20/2015 0111 09/21/2015 1903 Full Code [id number]  Edwin Dada, MD Inpatient   10/14/2014 2203 10/15/2014 1728 Full Code [id number]  Allyne Gee, MD ED   Advance Care Planning Activity    Questions for Most Recent Historical Code Status (Order [id number])       Home/SNF/Other home  Chief Complaint COPD exacerbation ([place]) [J44.1]  Level of Care/Admitting Diagnosis ED Disposition    ED Disposition Condition [place]: [facility] [[id number]]  Level of Care: Med-Surg [16]  May admit patient to [facility] or [facility] if equivalent level of care is available:: Yes  Covid Evaluation: Confirmed COVID Negative  Diagnosis: COPD exacerbation [facility]) [[id number]]  Admitting Physician: Sherron Monday  Attending Physician: Sherron Monday  Estimated length of stay: past midnight tomorrow  Certification:: I certify this patient will need inpatient services for at least 2 midnights       Medical History Past Medical History:  Diagnosis Date  . Diabetes mellitus ([place]) 10/2014   pt denies being diabetic.   . Emphysema of lung ([place])   . Emphysema/COPD ([place]) 10/2014  . Hepatic steatosis   . Thrombocytopenia ([place]) 09/2015   platelets in 120s.     Allergies No  Known Allergies  IV Location/Drains/Wounds Patient Lines/Drains/Airways Status    Active Line/Drains/Airways    Name Placement date Placement time Site Days   Peripheral IV 10/20/20 Left Forearm 10/20/20  1240  Forearm  less than 1   Incision (Closed) 05/15/18 Groin Left 05/15/18  1134  -- 889          Labs/Imaging Results for orders placed or performed during the hospital encounter of 10/20/20 (from the past 48 hour(s))  Basic metabolic panel     Status: Abnormal   Collection Time: 10/20/20 12:19 PM  Result Value Ref Range   Sodium 134 (L) 135 - 145 mmol/L   Potassium 3.9 3.5 - 5.1 mmol/L   Chloride 101 98 - 111 mmol/L   CO2 23 22 - 32 mmol/L   Glucose, Bld 287 (H) 70 - 99 mg/dL    Comment: Glucose reference range applies only to samples taken after fasting for at least 8 hours.   BUN 16 8 - 23 mg/dL   Creatinine, Ser 0.89 0.61 - 1.24 mg/dL   Calcium 9.0 8.9 - 10.3 mg/dL   GFR, Estimated >60 >60 mL/min    Comment: (NOTE) Calculated using the CKD-EPI Creatinine Equation (2021)    Anion gap 10 5 - 15    Comment: Performed at [facility], [place] [address] [place], [place] [address]  CBC     Status: None   Collection Time: 10/20/20 12:19 PM  Result Value Ref Range   WBC 9.3 4.0 -  10.5 K/uL   RBC 4.78 4.22 - 5.81 MIL/uL   Hemoglobin 14.6 13.0 - 17.0 g/dL   HCT 44.0 39.0 - 52.0 %   MCV 92.1 80.0 - 100.0 fL   MCH 30.5 26.0 - 34.0 pg   MCHC 33.2 30.0 - 36.0 g/dL   RDW 12.0 11.5 - 15.5 %   Platelets 241 150 - 400 K/uL   nRBC 0.0 0.0 - 0.2 %    Comment: Performed at [facility], [place] [address], [place], [place] [address]  Troponin I (High Sensitivity)     Status: None   Collection Time: 10/20/20 12:19 PM  Result Value Ref Range   Troponin I (High Sensitivity) 3 <18 ng/L    Comment: (NOTE) Elevated high sensitivity troponin I (hsTnI) values and significant  changes across serial measurements may suggest ACS but many other  chronic and  acute conditions are known to elevate hsTnI results.  Refer to the "Links" section for chest pain algorithms and additional  guidance. Performed at [facility], [place] [address], [place], [place] [address]   Troponin I (High Sensitivity)     Status: None   Collection Time: 10/20/20  2:19 PM  Result Value Ref Range   Troponin I (High Sensitivity) 11 <18 ng/L    Comment: (NOTE) Elevated high sensitivity troponin I (hsTnI) values and significant  changes across serial measurements may suggest ACS but many other  chronic and acute conditions are known to elevate hsTnI results.  Refer to the "Links" section for chest pain algorithms and additional  guidance. Performed at [facility], [place] [address], [place],  [address]   Resp Panel by RT-PCR (Flu A&B, Covid) Nasopharyngeal Swab     Status: None   Collection Time: 10/20/20  3:48 PM   Specimen: Nasopharyngeal Swab; Nasopharyngeal(NP) swabs in vial transport medium  Result Value Ref Range   SARS Coronavirus 2 by RT PCR NEGATIVE NEGATIVE    Comment: (NOTE) SARS-CoV-2 target nucleic acids are NOT DETECTED.  The SARS-CoV-2 RNA is generally detectable in upper respiratory specimens during the acute phase of infection. The lowest concentration of SARS-CoV-2 viral copies this assay can detect is 138 copies/mL. A negative result does not preclude SARS-Cov-2 infection and should not be used as the sole basis for treatment or other patient management decisions. A negative result may occur with  improper specimen collection/handling, submission of specimen other than nasopharyngeal swab, presence of viral mutation(s) within the areas targeted by this assay, and inadequate number of viral copies(<138 copies/mL). A negative result must be combined with clinical observations, patient history, and epidemiological information. The expected result is Negative.  Fact Sheet for Patients:   EntrepreneurPulse.com.au  Fact Sheet for Healthcare Providers:  IncredibleEmployment.be  This test is no t yet approved or cleared by the [place] FDA and  has been authorized for detection and/or diagnosis of SARS-CoV-2 by FDA under an Emergency Use Authorization (EUA). This EUA will remain  in effect (meaning this test can be used) for the duration of the COVID-19 declaration under Section 564(b)(1) of the Act, 21 U.S.C.section 360bbb-3(b)(1), unless the authorization is terminated  or revoked sooner.       Influenza A by PCR NEGATIVE NEGATIVE   Influenza B by PCR NEGATIVE NEGATIVE    Comment: (NOTE) The Xpert Xpress SARS-CoV-2/FLU/RSV plus assay is intended as an aid in the diagnosis of influenza from Nasopharyngeal swab specimens and should not be used as a sole basis for treatment.  Nasal washings and aspirates are unacceptable for Xpert Xpress SARS-CoV-2/FLU/RSV testing.  Fact Sheet for Patients: EntrepreneurPulse.com.au  Fact Sheet for Healthcare Providers: IncredibleEmployment.be  This test is not yet approved or cleared by the [place] FDA and has been authorized for detection and/or diagnosis of SARS-CoV-2 by FDA under an Emergency Use Authorization (EUA). This EUA will remain in effect (meaning this test can be used) for the duration of the COVID-19 declaration under Section 564(b)(1) of the Act, 21 U.S.C. section 360bbb-3(b)(1), unless the authorization is terminated or revoked.  Performed at [facility], [place] [address] [place], [place] [address]    DG Chest 2 View  Result Date: 10/20/2020 CLINICAL DATA:  Cough. EXAM: CHEST - 2 VIEW COMPARISON:  09/13/2020. FINDINGS: Mediastinum and hilar structures normal. Mild peribronchial cuffing. Minimal infiltrate right mid lung cannot be excluded. No pleural effusion or pneumothorax. Heart size normal. No acute bony  abnormality. IMPRESSION: Mild peribronchial cuffing suggesting bronchitis. Minimal infiltrate right mid lung cannot be excluded. Electronically Signed   By: Marcello Moores  Register   On: 10/20/2020 12:46   CT Angio Chest PE W and/or Wo Contrast  Result Date: 10/20/2020 CLINICAL DATA:  Cough, chest pain and hemoptysis for 4 days. EXAM: CT ANGIOGRAPHY CHEST WITH CONTRAST TECHNIQUE: Multidetector CT imaging of the chest was performed using the standard protocol during bolus administration of intravenous contrast. Multiplanar CT image reconstructions and MIPs were obtained to evaluate the vascular anatomy. CONTRAST:  35mL OMNIPAQUE IOHEXOL 350 MG/ML SOLN COMPARISON:  Cardiac CT 09/15/2020 FINDINGS: Cardiovascular: The heart is normal in size. No pericardial effusion. The aorta is normal in caliber. No dissection. The contrast is coming in through the left subclavian vein and the patient has a duplicated SVC. The contrast comes down the left-sided SVC and into the right atrium. The pulmonary arterial tree and is well opacified. No filling defects to suggest pulmonary embolism. Mediastinum/Nodes: Stable scattered mediastinal and hilar lymph nodes but no mass or overt adenopathy. Findings likely due to the underlying lung disease. The esophagus is grossly normal. Lungs/Pleura: Underlining significant emphysematous changes with apical bulla and associated pulmonary scarring changes. Areas of moderate bronchial wall thickening most notably in the lower lobes bilaterally likely reflecting bronchitis. No focal infiltrates or pulmonary edema. Some patchy areas of tree-in-bud type findings most notably in the superior segment of the right lower lobe suggesting chronic inflammation or atypical infection. No pleural effusions. No worrisome pulmonary lesions or pulmonary nodules. Upper Abdomen: No significant upper abdominal findings. Musculoskeletal: No significant bony findings. Review of the MIP images confirms the above findings.  IMPRESSION: 1. No CT findings for pulmonary embolism. 2. Normal thoracic aorta. 3. Duplicated SVC. 4. Underlying significant emphysematous changes and pulmonary scarring changes. 5. Bronchial wall thickening suggesting bronchitis and patchy areas of tree-in-bud type findings most notably in the superior segment of the right lower lobe suggesting chronic inflammation or atypical infection. Emphysema (ICD10-J43.9). Electronically Signed   By: Marijo Sanes M.D.   On: 10/20/2020 17:06    Pending Labs Unresulted Labs (From admission, onward)          Start     Ordered   10/21/20 0500  Creatinine, serum  Daily,   R      10/20/20 1947   10/20/20 1948  MRSA PCR Screening  Once,   STAT        10/20/20 1947   Signed and Held  CBC  (enoxaparin (LOVENOX)    CrCl >/= 30 ml/min)  Once,  R       Comments: Baseline for enoxaparin therapy IF NOT ALREADY DRAWN.  Notify MD if PLT < 100 K.    Signed and Held   Signed and Held  Creatinine, serum  (enoxaparin (LOVENOX)    CrCl >/= 30 ml/min)  Once,   R       Comments: Baseline for enoxaparin therapy IF NOT ALREADY DRAWN.    Signed and Held   Signed and Held  Creatinine, serum  (enoxaparin (LOVENOX)    CrCl >/= 30 ml/min)  Weekly,   R     Comments: while on enoxaparin therapy    Signed and Held   Signed and Held  Comprehensive metabolic panel  Tomorrow morning,   R        Signed and Held   Signed and Held  CBC  Tomorrow morning,   R        Signed and Held   Signed and Held  Culture, sputum-assessment  Once,   R        Signed and Held          Vitals/Pain Today's Vitals   10/20/20 1642 10/20/20 1730 10/20/20 1815 10/20/20 1900  BP:  101/68 111/76 112/72  Pulse:  100 97 100  Resp:  17 18 16   Temp:      TempSrc:      SpO2:  93% 91% 90%  PainSc: 6        Isolation Precautions Airborne and Contact precautions  Medications Medications  nystatin (MYCOSTATIN) 100000 UNIT/ML suspension 500,000 Units (500,000 Units Oral Given 10/20/20 1824)   piperacillin-tazobactam (ZOSYN) IVPB 3.375 g (has no administration in time range)  vancomycin (VANCOREADY) IVPB 1500 mg/300 mL (has no administration in time range)  vancomycin (VANCOCIN) IVPB 1000 mg/200 mL premix (has no administration in time range)  fluconazole (DIFLUCAN) tablet 150 mg (150 mg Oral Given 10/20/20 1542)  methylPREDNISolone sodium succinate (SOLU-MEDROL) 125 mg/2 mL injection 125 mg (125 mg Intravenous Given 10/20/20 1542)  albuterol (VENTOLIN HFA) 108 (90 Base) MCG/ACT inhaler 6 puff (6 puffs Inhalation Given 10/20/20 1542)  aerochamber Z-Stat Plus/medium 1 each (1 each Other Given 10/20/20 1547)  sodium chloride 0.9 % bolus 1,000 mL (0 mLs Intravenous Stopped 10/20/20 1745)  acetaminophen (TYLENOL) tablet 650 mg (650 mg Oral Given 10/20/20 1542)  cefTRIAXone (ROCEPHIN) 1 g in sodium chloride 0.9 % 100 mL IVPB (0 g Intravenous Stopped 10/20/20 1617)  azithromycin (ZITHROMAX) 500 mg in sodium chloride 0.9 % 250 mL IVPB (0 mg Intravenous Stopped 10/20/20 1744)  iohexol (OMNIPAQUE) 350 MG/ML injection 75 mL (75 mLs Intravenous Contrast Given 10/20/20 1632)  guaiFENesin-codeine 100-10 MG/5ML solution 5 mL (5 mLs Oral Given 10/20/20 1740)  ipratropium-albuterol (DUONEB) 0.5-2.5 (3) MG/3ML nebulizer solution 3 mL (3 mLs Nebulization Given 10/20/20 1742)  ipratropium-albuterol (DUONEB) 0.5-2.5 (3) MG/3ML nebulizer solution 3 mL (3 mLs Nebulization Given 10/20/20 1740)    Mobility ambulatory

## 2020-10-21 ENCOUNTER — Other Ambulatory Visit: Payer: Self-pay

## 2020-10-21 DIAGNOSIS — J209 Acute bronchitis, unspecified: Secondary | ICD-10-CM

## 2020-10-21 DIAGNOSIS — J440 Chronic obstructive pulmonary disease with acute lower respiratory infection: Secondary | ICD-10-CM

## 2020-10-21 DIAGNOSIS — J181 Lobar pneumonia, unspecified organism: Secondary | ICD-10-CM

## 2020-10-21 DIAGNOSIS — J441 Chronic obstructive pulmonary disease with (acute) exacerbation: Secondary | ICD-10-CM | POA: Diagnosis not present

## 2020-10-21 LAB — COMPREHENSIVE METABOLIC PANEL
ALT: 22 U/L (ref 0–44)
AST: 15 U/L (ref 15–41)
Albumin: 3.6 g/dL (ref 3.5–5.0)
Alkaline Phosphatase: 66 U/L (ref 38–126)
Anion gap: 11 (ref 5–15)
BUN: 16 mg/dL (ref 8–23)
CO2: 22 mmol/L (ref 22–32)
Calcium: 8.5 mg/dL — ABNORMAL LOW (ref 8.9–10.3)
Chloride: 102 mmol/L (ref 98–111)
Creatinine, Ser: 0.85 mg/dL (ref 0.61–1.24)
GFR, Estimated: >60 mL/min (ref >60)
Glucose, Bld: 253 mg/dL — ABNORMAL HIGH (ref 70–99)
Potassium: 4 mmol/L (ref 3.5–5.1)
Sodium: 135 mmol/L (ref 135–145)
Total Bilirubin: 0.9 mg/dL (ref 0.3–1.2)
Total Protein: 6.9 g/dL (ref 6.5–8.1)

## 2020-10-21 LAB — CBC
HCT: 38.3 % — ABNORMAL LOW (ref 39.0–52.0)
Hemoglobin: 12.8 g/dL — ABNORMAL LOW (ref 13.0–17.0)
MCH: 30.5 pg (ref 26.0–34.0)
MCHC: 33.4 g/dL (ref 30.0–36.0)
MCV: 91.2 fL (ref 80.0–100.0)
Platelets: 237 10*3/uL (ref 150–400)
RBC: 4.2 MIL/uL — ABNORMAL LOW (ref 4.22–5.81)
RDW: 12 % (ref 11.5–15.5)
WBC: 9.3 10*3/uL (ref 4.0–10.5)
nRBC: 0 % (ref 0.0–0.2)

## 2020-10-21 LAB — GLUCOSE, CAPILLARY: Glucose-Capillary: 223 mg/dL — ABNORMAL HIGH (ref 70–99)

## 2020-10-21 LAB — EXPECTORATED SPUTUM ASSESSMENT W GRAM STAIN, RFLX TO RESP C

## 2020-10-21 LAB — MRSA PCR SCREENING: MRSA by PCR: NEGATIVE

## 2020-10-21 MED ORDER — GUAIFENESIN-DM 100-10 MG/5ML PO SYRP
5.0000 mL | ORAL_SOLUTION | ORAL | Status: DC | PRN
  Administered 2020-10-21 (×2): 5 mL via ORAL
  Filled 2020-10-21 (×3): qty 5

## 2020-10-21 MED ORDER — GUAIFENESIN-DM 100-10 MG/5ML PO SYRP: 5.0000 mL | ORAL_SOLUTION | ORAL | 0 refills | Status: DC | PRN

## 2020-10-21 MED ORDER — PREDNISONE 10 MG (21) PO TBPK: ORAL_TABLET | ORAL | 0 refills | Status: DC

## 2020-10-21 MED ORDER — AMOXICILLIN-POT CLAVULANATE 875-125 MG PO TABS: 1.0000 | ORAL_TABLET | Freq: Two times a day (BID) | ORAL | 0 refills | Status: AC

## 2020-10-21 NOTE — Discharge Summary (Signed)
Physician Discharge Summary  Duane Cuevas GEX:[id number] DOB: 1956-01-11 DOA: 10/20/2020  PCP: Duane Pollen, MD  Admit date: 10/20/2020 Discharge date: 10/21/2020  Admitted From: Home  Discharge disposition: Home  Recommendations for Outpatient Follow-Up:   . Follow up with your primary care provider in one week.  . Check CBC, BMP, magnesium in the next visit  Discharge Diagnosis:   Active Problems:   COPD exacerbation ([place])    Discharge Condition: Improved.  Diet recommendation: Low sodium, heart healthy.  Carbohydrate-modified.    Wound care: None.  Code status: Full.   History of Present Illness:   Duane Cuevas is a 65 y.o. male with past medical history of diabetes mellitus, COPD, chronic thrombocytopenia presented to hospital with chest pain and shortness of breath for 4 days.  In the ED patient was given Solu-Medrol DuoNeb Rocephin and Zithromax and was admitted hospital for possible pneumonia and COPD exacerbation.   Hospital Course:   Following conditions were addressed during hospitalization as listed below,  Acute bronchitis with patchy right lower lobe community-acquired pneumonia. Patient was initially given Zosyn and vancomycin.  At this time will consider Augmentin on discharge.  Patient was continued on nebulizer steroids.  He did not require any supplemental oxygen prior to discharge.  Acute exacerbation of COPD Patient had wheezing and exacerbation of COPD.  He was initially given IV steroids, Nebulizers, O2 therapy and expectorants as needed.  He did not require supplemental oxygen prior to discharge.  His wheezing and bronchospasm improved.  He will be given short course of prednisone and discharge  Diabetes mellitus type 2. Will be resumed on glipizide at home.  Continue diabetic diet   History of GERD - Continue Protonix   History of hypertension - Continue Norvasc   Hyperlipidemia - Continue Lipitor    Disposition.   At this time, patient is stable for disposition home with outpatient PCP follow-up  Medical Consultants:    None.  Procedures:    None Subjective:   Today, patient was seen and examined at bedside.  Patient feels much better with breathing.  Denies any chest pain, dizziness, lightheadedness.  Off oxygen  Discharge Exam:   Vitals:   10/21/20 0417 10/21/20 0815  BP: 105/71 120/81  Pulse: 84 89  Resp: (!) 22 (!) 22  Temp: 97.7 F (36.5 C) 98.2 F (36.8 C)  SpO2: 93% 97%   Vitals:   10/20/20 2100 10/21/20 0030 10/21/20 0417 10/21/20 0815  BP:  109/75 105/71 120/81  Pulse:  90 84 89  Resp:  (!) 21 (!) 22 (!) 22  Temp:  97.8 F (36.6 C) 97.7 F (36.5 C) 98.2 F (36.8 C)  TempSrc:  Oral Oral Oral  SpO2:  96% 93% 97%  Weight: 76 kg     Height: 5' 8" (1.727 m)       General: Alert awake, not in obvious distress HENT: pupils equally reacting to light,  No scleral pallor or icterus noted. Oral mucosa is moist.  Chest:   Diminished breath sounds bilaterally.  CVS: S1 &S2 heard. No murmur.  Regular rate and rhythm. Abdomen: Soft, nontender, nondistended.  Bowel sounds are heard.   Extremities: No cyanosis, clubbing or edema.  Peripheral pulses are palpable. Psych: Alert, awake and oriented, normal mood CNS:  No cranial nerve deficits.  Power equal in all extremities.   Skin: Warm and dry.  No rashes noted.  The results of significant diagnostics from this hospitalization (including imaging, microbiology, ancillary and laboratory) are listed  below for reference.     Diagnostic Studies:   DG Chest 2 View  Result Date: 10/20/2020 CLINICAL DATA:  Cough. EXAM: CHEST - 2 VIEW COMPARISON:  09/13/2020. FINDINGS: Mediastinum and hilar structures normal. Mild peribronchial cuffing. Minimal infiltrate right mid lung cannot be excluded. No pleural effusion or pneumothorax. Heart size normal. No acute bony abnormality. IMPRESSION: Mild peribronchial cuffing suggesting bronchitis.  Minimal infiltrate right mid lung cannot be excluded. Electronically Signed   By: Marcello Moores  Register   On: 10/20/2020 12:46   CT Angio Chest PE W and/or Wo Contrast  Result Date: 10/20/2020 CLINICAL DATA:  Cough, chest pain and hemoptysis for 4 days. EXAM: CT ANGIOGRAPHY CHEST WITH CONTRAST TECHNIQUE: Multidetector CT imaging of the chest was performed using the standard protocol during bolus administration of intravenous contrast. Multiplanar CT image reconstructions and MIPs were obtained to evaluate the vascular anatomy. CONTRAST:  87m OMNIPAQUE IOHEXOL 350 MG/ML SOLN COMPARISON:  Cardiac CT 09/15/2020 FINDINGS: Cardiovascular: The heart is normal in size. No pericardial effusion. The aorta is normal in caliber. No dissection. The contrast is coming in through the left subclavian vein and the patient has a duplicated SVC. The contrast comes down the left-sided SVC and into the right atrium. The pulmonary arterial tree and is well opacified. No filling defects to suggest pulmonary embolism. Mediastinum/Nodes: Stable scattered mediastinal and hilar lymph nodes but no mass or overt adenopathy. Findings likely due to the underlying lung disease. The esophagus is grossly normal. Lungs/Pleura: Underlining significant emphysematous changes with apical bulla and associated pulmonary scarring changes. Areas of moderate bronchial wall thickening most notably in the lower lobes bilaterally likely reflecting bronchitis. No focal infiltrates or pulmonary edema. Some patchy areas of tree-in-bud type findings most notably in the superior segment of the right lower lobe suggesting chronic inflammation or atypical infection. No pleural effusions. No worrisome pulmonary lesions or pulmonary nodules. Upper Abdomen: No significant upper abdominal findings. Musculoskeletal: No significant bony findings. Review of the MIP images confirms the above findings. IMPRESSION: 1. No CT findings for pulmonary embolism. 2. Normal thoracic  aorta. 3. Duplicated SVC. 4. Underlying significant emphysematous changes and pulmonary scarring changes. 5. Bronchial wall thickening suggesting bronchitis and patchy areas of tree-in-bud type findings most notably in the superior segment of the right lower lobe suggesting chronic inflammation or atypical infection. Emphysema (ICD10-J43.9). Electronically Signed   By: PMarijo SanesM.D.   On: 10/20/2020 17:06     Labs:   Basic Metabolic Panel: Recent Labs  Lab 10/20/20 1219 10/21/20 0502  NA 134* 135  K 3.9 4.0  CL 101 102  CO2 23 22  GLUCOSE 287* 253*  BUN 16 16  CREATININE 0.89 0.85  CALCIUM 9.0 8.5*   GFR Estimated Creatinine Clearance: 83.8 mL/min (by C-G formula based on SCr of 0.85 mg/dL). Liver Function Tests: Recent Labs  Lab 10/21/20 0502  AST 15  ALT 22  ALKPHOS 66  BILITOT 0.9  PROT 6.9  ALBUMIN 3.6   No results for input(s): LIPASE, AMYLASE in the last 168 hours. No results for input(s): AMMONIA in the last 168 hours. Coagulation profile No results for input(s): INR, PROTIME in the last 168 hours.  CBC: Recent Labs  Lab 10/20/20 1219 10/21/20 0502  WBC 9.3 9.3  HGB 14.6 12.8*  HCT 44.0 38.3*  MCV 92.1 91.2  PLT 241 237   Cardiac Enzymes: No results for input(s): CKTOTAL, CKMB, CKMBINDEX, TROPONINI in the last 168 hours. BNP: Invalid input(s): POCBNP CBG: Recent Labs  Lab 10/20/20 2027 10/21/20 0805  GLUCAP 264* 223*   D-Dimer No results for input(s): DDIMER in the last 72 hours. Hgb A1c No results for input(s): HGBA1C in the last 72 hours. Lipid Profile No results for input(s): CHOL, HDL, LDLCALC, TRIG, CHOLHDL, LDLDIRECT in the last 72 hours. Thyroid function studies No results for input(s): TSH, T4TOTAL, T3FREE, THYROIDAB in the last 72 hours.  Invalid input(s): FREET3 Anemia work up No results for input(s): VITAMINB12, FOLATE, FERRITIN, TIBC, IRON, RETICCTPCT in the last 72 hours. Microbiology Recent Results (from the past 240  hour(s))  Resp Panel by RT-PCR (Flu A&B, Covid) Nasopharyngeal Swab     Status: None   Collection Time: 10/20/20  3:48 PM   Specimen: Nasopharyngeal Swab; Nasopharyngeal(NP) swabs in vial transport medium  Result Value Ref Range Status   SARS Coronavirus 2 by RT PCR NEGATIVE NEGATIVE Final    Comment: (NOTE) SARS-CoV-2 target nucleic acids are NOT DETECTED.  The SARS-CoV-2 RNA is generally detectable in upper respiratory specimens during the acute phase of infection. The lowest concentration of SARS-CoV-2 viral copies this assay can detect is 138 copies/mL. A negative result does not preclude SARS-Cov-2 infection and should not be used as the sole basis for treatment or other patient management decisions. A negative result may occur with  improper specimen collection/handling, submission of specimen other than nasopharyngeal swab, presence of viral mutation(s) within the areas targeted by this assay, and inadequate number of viral copies(<138 copies/mL). A negative result must be combined with clinical observations, patient history, and epidemiological information. The expected result is Negative.  Fact Sheet for Patients:  EntrepreneurPulse.com.au  Fact Sheet for Healthcare Providers:  IncredibleEmployment.be  This test is no t yet approved or cleared by the [place] FDA and  has been authorized for detection and/or diagnosis of SARS-CoV-2 by FDA under an Emergency Use Authorization (EUA). This EUA will remain  in effect (meaning this test can be used) for the duration of the COVID-19 declaration under Section 564(b)(1) of the Act, 21 U.S.C.section 360bbb-3(b)(1), unless the authorization is terminated  or revoked sooner.       Influenza A by PCR NEGATIVE NEGATIVE Final   Influenza B by PCR NEGATIVE NEGATIVE Final    Comment: (NOTE) The Xpert Xpress SARS-CoV-2/FLU/RSV plus assay is intended as an aid in the diagnosis of influenza from  Nasopharyngeal swab specimens and should not be used as a sole basis for treatment. Nasal washings and aspirates are unacceptable for Xpert Xpress SARS-CoV-2/FLU/RSV testing.  Fact Sheet for Patients: EntrepreneurPulse.com.au  Fact Sheet for Healthcare Providers: IncredibleEmployment.be  This test is not yet approved or cleared by the [place] FDA and has been authorized for detection and/or diagnosis of SARS-CoV-2 by FDA under an Emergency Use Authorization (EUA). This EUA will remain in effect (meaning this test can be used) for the duration of the COVID-19 declaration under Section 564(b)(1) of the Act, 21 U.S.C. section 360bbb-3(b)(1), unless the authorization is terminated or revoked.  Performed at [facility], [place] [address], [place], [place] [address]   MRSA PCR Screening     Status: None   Collection Time: 10/21/20  3:00 AM   Specimen: Nasopharyngeal  Result Value Ref Range Status   MRSA by PCR NEGATIVE NEGATIVE Final    Comment:        The GeneXpert MRSA Assay (FDA approved for NASAL specimens only), is one component of a comprehensive MRSA colonization surveillance program. It is not intended to diagnose MRSA infection nor  to guide or monitor treatment for MRSA infections. Performed at [facility], [place] [address], [place], [place] [address]   Culture, sputum-assessment     Status: None   Collection Time: 10/21/20  6:04 AM   Specimen: Sputum  Result Value Ref Range Status   Specimen Description SPUTUM  Final   Special Requests NONE  Final   Sputum evaluation   Final    THIS SPECIMEN IS ACCEPTABLE FOR SPUTUM CULTURE Performed at [facility], [place] [address], [place], [place] [address]    Report Status 10/21/2020 FINAL  Final     Discharge Instructions:   Discharge Instructions     Call MD for:  temperature >100.4   Complete by: As directed    Diet - low  sodium heart healthy   Complete by: As directed    Diet Carb Modified   Complete by: As directed    Discharge instructions   Complete by: As directed    No smoking please.  Follow-up with your primary care physician in 1 week.  Complete course of antibiotic   Increase activity slowly   Complete by: As directed       Allergies as of 10/21/2020   No Known Allergies      Medication List     STOP taking these medications    aspirin 81 MG EC tablet       TAKE these medications    Accu-Chek Guide test strip Generic drug: glucose blood Use as instructed   acetaminophen 500 MG tablet Commonly known as: TYLENOL Take 1,000 mg by mouth every 6 (six) hours as needed for moderate pain or headache.   albuterol (2.5 MG/3ML) 0.083% nebulizer solution Commonly known as: PROVENTIL TAKE 3 ML(2.5 MG TOTAL) BY NEBULIZATION EVERY 6(SIX) HOURS AS NEEDED What changed: See the new instructions.   amLODipine 5 MG tablet Commonly known as: NORVASC Take 1 tablet (5 mg total) by mouth daily.   amoxicillin-clavulanate 875-125 MG tablet Commonly known as: Augmentin Take 1 tablet by mouth 2 (two) times daily for 4 days.   atorvastatin 20 MG tablet Commonly known as: LIPITOR Take 1 tablet (20 mg total) by mouth daily.   blood glucose meter kit and supplies Kit Dispense based on patient and insurance preference. Use up to four times daily as directed.   budesonide-formoterol 160-4.5 MCG/ACT inhaler Commonly known as: Symbicort Inhale 2 puffs into the lungs 2 (two) times daily.   glipiZIDE 10 MG tablet Commonly known as: GLUCOTROL Take 1 tablet (10 mg total) by mouth 2 (two) times daily before a meal.   guaiFENesin-dextromethorphan 100-10 MG/5ML syrup Commonly known as: ROBITUSSIN DM Take 5 mLs by mouth every 4 (four) hours as needed for cough (chest congestion).   insulin glargine 100 UNIT/ML Solostar Pen Commonly known as: LANTUS Inject 20 Units into the skin daily.   insulin  lispro 100 UNIT/ML KwikPen Commonly known as: HUMALOG Inject 3 Units into the skin 3 (three) times daily. Adjust amount of insulin as per sliding scale.   metFORMIN 1000 MG tablet Commonly known as: GLUCOPHAGE Take 1 tablet (1,000 mg total) by mouth 2 (two) times daily with a meal.   pantoprazole 40 MG tablet Commonly known as: PROTONIX TAKE 1 TABLET BY MOUTH 2 TIMES DAILY BEFORE MEALS   predniSONE 10 MG (21) Tbpk tablet Commonly known as: STERAPRED UNI-PAK 21 TAB 3 tab orally daily x 2 days then 2 tab po daily x 2 days then 1 tab po daily  x 2 days   Unifine Pentips 32G X 4 MM Misc Generic drug: Insulin Pen Needle USE TO INJECT LANTUS AND HUMALOG 4 TIMES DAILY        Follow-up Information     Duane Pollen, MD. Schedule an appointment as soon as possible for a visit in 1 week(s).   Specialty: Internal Medicine Contact information: [place] [address] [phone]                  Time coordinating discharge: 39 minutes  Signed:  Laxman Pokhrel  [facility] 10/21/2020, 10:04 AM

## 2020-10-21 NOTE — Care Management CC44 (Signed)
Condition Code 44 Documentation Completed  Patient Details  Name: Duane Cuevas MRN: [id number] Date of Birth: 14-Aug-1955   Condition Code 44 given:  Yes Patient signature on Condition Code 44 notice:  Yes Documentation of 2 MD's agreement:  Yes Code 44 added to claim:  Yes    Trish Mage, LCSW 10/21/2020, 12:02 PM

## 2020-10-21 NOTE — Care Management Obs Status (Signed)
[place] NOTIFICATION   Patient Details  Name: Duane Cuevas MRN: [id number] Date of Birth: Jan 16, 1956   Medicare Observation Status Notification Given:  Yes    Trish Mage, LCSW 10/21/2020, 12:01 PM

## 2020-10-21 NOTE — Plan of Care (Signed)
  Problem: Health Behavior/Discharge Planning: Goal: Ability to manage health-related needs will improve Outcome: Adequate for Discharge   Problem: Clinical Measurements: Goal: Will remain free from infection Outcome: Adequate for Discharge Goal: Diagnostic test results will improve Outcome: Adequate for Discharge Goal: Respiratory complications will improve Outcome: Adequate for Discharge   Problem: Activity: Goal: Risk for activity intolerance will decrease Outcome: Adequate for Discharge   Problem: Pain Managment: Goal: General experience of comfort will improve Outcome: Adequate for Discharge   Problem: Respiratory: Goal: Ability to maintain a clear airway will improve Outcome: Adequate for Discharge Goal: Levels of oxygenation will improve Outcome: Adequate for Discharge Goal: Ability to maintain adequate ventilation will improve Outcome: Adequate for Discharge

## 2020-10-21 NOTE — Plan of Care (Signed)
  Problem: Respiratory: Goal: Ability to maintain a clear airway will improve Outcome: Progressing   Problem: Clinical Measurements: Goal: Respiratory complications will improve Outcome: Progressing   Problem: Pain Managment: Goal: General experience of comfort will improve Outcome: Progressing   Problem: Pain Managment: Goal: General experience of comfort will improve Outcome: Progressing

## 2020-10-21 NOTE — Progress Notes (Signed)
Pt walked from room to the end of hall and back. O2 sats remained 95%, per MD okay to discharge.

## 2020-10-21 NOTE — Progress Notes (Signed)
Chaplain engaged in an initial visit with Duane Cuevas and a family member.  Chaplain introduced herself and explained role. [place] expressed excitement over discharging today.   Chaplain is available to provide support as needed.    10/21/20 1200  Clinical Encounter Type  Visited With Patient and family together  Visit Type Initial

## 2020-10-22 ENCOUNTER — Telehealth: Payer: Self-pay

## 2020-10-22 ENCOUNTER — Encounter: Payer: Self-pay | Admitting: Emergency Medicine

## 2020-10-22 ENCOUNTER — Other Ambulatory Visit: Payer: Self-pay | Admitting: *Deleted

## 2020-10-22 ENCOUNTER — Ambulatory Visit (INDEPENDENT_AMBULATORY_CARE_PROVIDER_SITE_OTHER): Payer: Medicare HMO | Admitting: Emergency Medicine

## 2020-10-22 VITALS — BP 120/70 | HR 105 | Temp 98.6°F | Ht 68.0 in | Wt 167.0 lb

## 2020-10-22 DIAGNOSIS — E1165 Type 2 diabetes mellitus with hyperglycemia: Secondary | ICD-10-CM

## 2020-10-22 DIAGNOSIS — J431 Panlobular emphysema: Secondary | ICD-10-CM

## 2020-10-22 DIAGNOSIS — Z09 Encounter for follow-up examination after completed treatment for conditions other than malignant neoplasm: Secondary | ICD-10-CM | POA: Diagnosis not present

## 2020-10-22 MED ORDER — BLOOD GLUCOSE MONITOR KIT: PACK | 0 refills | Status: DC

## 2020-10-22 MED ORDER — ACCU-CHEK GUIDE VI STRP: ORAL_STRIP | 11 refills | Status: DC

## 2020-10-22 NOTE — Telephone Encounter (Signed)
I pend a Rx for One Touch glucose meter and supplies kit, the patient's insurance prefers this kit. The patient's pharmacy is [facility] on [address] [place]. Thank you.

## 2020-10-22 NOTE — Patient Instructions (Signed)
Enfermedad pulmonar obstructiva crnica Chronic Obstructive Pulmonary Disease  La enfermedad pulmonar obstructiva crnica (EPOC) es un problema pulmonar de larga duracin (crnico). La EPOC dificulta la entrada y salida de aire de los pulmones. Por lo general, la afeccin empeora con [employer], y los pulmones nunca volvern a la normalidad. Puede hacer algunas cosas para mantenerse lo ms sano posible. Cules son las causas?  Fumar. Esta es la causa ms frecuente.  Ciertos genes que se transmiten de padres a hijos (hereditarios). Qu incrementa el riesgo?  Estar expuesto a humo ambiental de cigarrillos, pipas o cigarros.  Estar expuesto a sustancias qumicas y otras sustancias irritantes, como gases y polvo en el ambiente laboral.  Raynelle Jan afecciones o infecciones pulmonares crnicas. Cules son los signos o sntomas?  Falta de aire, especialmente al realizar actividad fsica.  Tos a largo plazo con una gran cantidad de mucosidad espesa. A veces, es posible que la tos no contenga mucosidad (tos seca).  Sibilancias.  Respirar rpidamente.  Piel con un aspecto gris o azul, especialmente en [place] y los pies, o los labios.  Sensacin de cansancio (fatiga).  Prdida de peso.  Opresin en el pecho.  Tener infecciones con frecuencia.  Episodios en los que los sntomas respiratorios empeoran mucho (exacerbaciones). En las ltimas etapas de esta enfermedad, puede tener hinchazn de los tobillos, los pies o las piernas. Cmo se trata?  Usar medicamentos.  Si fuma, dejar de hacerlo.  Rehabilitacin. Esto incluye medidas para que el cuerpo funcione mejor. Puede requerir la participacin de un equipo de especialistas.  Hacer ejercicios.  Realizar cambios en la dieta.  Usar oxgeno.  Ciruga pulmonar.  Trasplante de pulmn.  Medidas para el bienestar (cuidados paliativos). Siga estas instrucciones en su casa: Medicamentos  Use los medicamentos de  venta libre y los recetados solamente como se lo haya indicado el mdico.  Hable con el mdico antes de tomar cualquier medicamento para la tos o las [place]. Es posible que Product/process development scientist los medicamentos que hagan que los pulmones se sequen. Estilo de vida  Si fuma, deje de fumar. Fumar empeora el problema.  No fume ni consuma ningn producto que contenga nicotina o tabaco. Si necesita ayuda para dejar de fumar, consulte al mdico.  Evite rodearse de factores que empeoran su respiracin. Por ejemplo, humo, sustancias qumicas y vapores.  Mantngase activo, pero recuerde que tambin Administrator.  Aprenda y utilice tcnicas para controlar el estrs y la respiracin.  Asegrese de dormir lo suficiente. La [employer] de los adultos necesitan dormir 7horas como mnimo todas las noches.  Consuma alimentos saludables. Consuma pequeas cantidades de alimentos con mayor frecuencia. Descanse antes de las comidas. Respiracin controlada Aprenda y utilice consejos sobre cmo controlar su respiracin como se lo haya indicado el mdico. Intente lo siguiente:  Inspire (inhale) por la nariz durante 1segundo. Luego frunza los labios y espire (exhale) a travs de los labios durante 2segundos.  Coloque una mano sobre el vientre (abdomen). Inhale lentamente por la nariz durante 1segundo. La mano que est sobre el vientre debe moverse. Frunza los labios y espire lentamente por los labios. La mano que est sobre su vientre debe moverse cuando espire.   Tos controlada Aprenda a utilizar la tos controlada para despejar la mucosidad de los pulmones. Siga estos pasos: 1. Incline la cabeza un poco hacia adelante. 2. Inspire profundamente. 3. Trate de mantener el aire por 3 segundos. 4. Mantenga la boca ligeramente abierta mientras tose 2veces. 5. Escupa la mucosidad  en un pauelo. 6. Descanse y repita los pasos 1 o 2 veces, segn lo necesite. Instrucciones generales  Asegrese de recibir todas las dosis  (vacunas) que el [employer] recomiende. Consulte al [employer] vacunas para la gripe y la neumona.  Use la oxigenoterapia y la rehabilitacin pulmonar si as se lo indica el mdico. Si necesita oxigenoterapia en su casa, pregntele al mdico si debe comprar una herramienta para medir su nivel de oxgeno (oxmetro).  Realice un plan de accin para la EPOC junto a su mdico. Esto lo ayudar a saber qu debe hacer si se siente peor que de costumbre.  Mantenga bajo control cualquier otra afeccin que padezca como se lo haya indicado el mdico.  Evite salir cuando hace mucho calor, fro o humedad.  Evite a las personas que tengan una enfermedad contagiosa.  Concurra a [place]. Comunquese con un mdico si:  Tose y elimina ms mucosidad que lo habitual.  Hay un cambio en el color o en la consistencia de la mucosidad.  Le cuesta respirar ms de lo habitual.  La respiracin es ms rpida de lo habitual.  Tiene dificultad para dormir.  Debe usar sus medicamentos con ms frecuencia que lo habitual.  Tiene dificultad para [employer] cotidianas, como vestirse o caminar por la casa. Solicite ayuda de inmediato si:  Tiene dificultad para Chemical engineer.  Tiene dificultad para respirar y [employer] hacer lo siguiente: ? Poder hablar. ? Hacer actividades normales.  Le duele el pecho durante ms de 18minutos.  Tiene la piel ms [employer] lo normal.  El pulsioxmetro muestra que tiene un nivel bajo de oxgeno durante ms de 36minutos.  Tiene fiebre.  Se siente demasiado cansado para respirar con normalidad. Estos sntomas pueden representar un problema grave que constituye Engineer, maintenance (IT). No espere a ver si los sntomas desaparecen. Solicite atencin mdica de inmediato. Comunquese con el servicio de emergencias de su localidad (911 en los [place]). No conduzca por sus propios medios [employer] hospital. Resumen  La  enfermedad pulmonar obstructiva crnica (EPOC) es un problema pulmonar de larga duracin.  El funcionamiento de los pulmones nunca volver a la normalidad. Por lo general, la enfermedad empeora con el tiempo. Puede hacer algunas cosas para mantenerse lo ms sano posible.  Use los medicamentos de venta libre y los recetados solamente como se lo haya indicado el mdico.  Si fuma, deje de [place]. Fumar empeora el problema. Esta informacin no tiene Marine scientist el consejo del mdico. Asegrese de hacerle al mdico cualquier pregunta que tenga. Document Revised: 05/19/2020 Document Reviewed: 05/19/2020 Elsevier Patient Education  [place]. Diabetes mellitus y nutricin, en adultos Diabetes Mellitus and Nutrition, Adult Si sufre de diabetes, o diabetes mellitus, es muy importante tener hbitos alimenticios saludables debido a que sus niveles de Designer, television/film set sangre (glucosa) se ven afectados en gran medida por lo que come y bebe. Comer alimentos saludables en las cantidades correctas, aproximadamente a la misma hora todos los [place], [place] ayudar a:  Aeronautical engineer glucemia.  Disminuir el riesgo de sufrir una enfermedad cardaca.  Mejorar la presin arterial.  Science writer o mantener un peso saludable. Qu puede afectar mi plan de alimentacin? Todas las personas que sufren de diabetes son diferentes y cada una tiene necesidades diferentes en cuanto a un plan de alimentacin. El mdico puede recomendarle que trabaje con un nutricionista para elaborar el mejor plan para usted. Su plan de alimentacin puede variar  segn factores como:  Las caloras que necesita.  Los medicamentos que toma.  Su peso.  Sus niveles de glucemia, presin arterial y colesterol.  Su nivel de [place].  Otras afecciones que tenga, como enfermedades cardacas o renales. Cmo me afectan los carbohidratos? Los carbohidratos, o hidratos de carbono, afectan su nivel de glucemia ms que cualquier otro tipo  de alimento. La ingesta de carbohidratos naturalmente aumenta la cantidad de [employer]. El recuento de carbohidratos es un mtodo destinado a Catering manager un registro de la cantidad de carbohidratos que se consumen. El recuento de carbohidratos es importante para Theatre manager la glucemia a un nivel saludable, especialmente si utiliza insulina o toma determinados medicamentos por va oral para la diabetes. Es importante conocer la cantidad de carbohidratos que se pueden ingerir en cada comida sin correr Engineer, manufacturing. Esto es Psychologist, forensic. Su nutricionista puede ayudarlo a calcular la cantidad de carbohidratos que debe ingerir en cada comida y en cada refrigerio. Cmo me afecta el alcohol? El alcohol puede provocar disminuciones sbitas de la glucemia (hipoglucemia), especialmente si utiliza insulina o toma determinados medicamentos por va oral para la diabetes. La hipoglucemia es una afeccin potencialmente mortal. Los sntomas de la hipoglucemia, como somnolencia, mareos y confusin, son similares a los sntomas de haber consumido demasiado alcohol.  No beba alcohol si: ? Su mdico le indica no hacerlo. ? Est embarazada, puede estar embarazada o est tratando de quedar embarazada.  Si bebe alcohol: ? No beba con el estmago vaco. ? Limite la cantidad que bebe:  De 0 a 1 medida por da para las mujeres.  De 0 a 2 medidas por da para los hombres. ? Est atento a la cantidad de alcohol que hay en las bebidas que toma. En los [place], una medida equivale a una botella de cerveza de 12oz (392ml), un vaso de vino de 5oz (16ml) o un vaso de una bebida alcohlica de alta graduacin de 1oz (64ml). ? Mantngase hidratado bebiendo agua, refrescos dietticos o t helado sin azcar.  Tenga en cuenta que los refrescos comunes, los jugos y otras bebida para Optician, dispensing pueden contener mucha azcar y se deben contar como carbohidratos. Consejos para seguir Photographer las  etiquetas de los alimentos  Comience por leer el tamao de la porcin en la "Informacin nutricional" en las etiquetas de los alimentos envasados y las bebidas. La cantidad de caloras, carbohidratos, grasas y otros nutrientes mencionados en la etiqueta se basan en una porcin del alimento. Muchos alimentos contienen ms de una porcin por envase.  Verifique la cantidad total de gramos (g) de carbohidratos totales en una porcin. Puede calcular la cantidad de porciones de carbohidratos al dividir el total de carbohidratos por 15. Por ejemplo, si un alimento tiene un total de 30g de carbohidratos totales por porcin, equivale a 2 porciones de carbohidratos.  Verifique la cantidad de gramos (g) de grasas saturadas y grasas trans de una porcin. Escoja alimentos que no contengan estas grasas o que su contenido de estas sea [place].  Verifique la cantidad de miligramos (mg) de sal (sodio) en una porcin. La mayora de las personas deben limitar la ingesta de sodio total a menos de 2300mg  por Training and development officer.  Siempre consulte la informacin nutricional de los alimentos etiquetados como "con bajo contenido de grasa" o "sin grasa". Estos alimentos pueden tener un mayor contenido de Location manager agregada o carbohidratos refinados, y deben evitarse.  Hable con su nutricionista para identificar sus objetivos diarios en cuanto a  los nutrientes mencionados en la etiqueta. Al ir de compras  Evite comprar alimentos procesados, enlatados o precocidos. Estos alimentos tienden a Special educational needs teacher mayor cantidad de [place], sodio y azcar agregada.  Compre en la zona exterior de la tienda de comestibles. Esta es la zona donde se encuentran con mayor frecuencia las frutas y las verduras frescas, los cereales a granel, las carnes frescas y los productos lcteos frescos. Al cocinar  Utilice mtodos de coccin a baja temperatura, como hornear, en lugar de mtodos de coccin a alta temperatura, como frer en abundante aceite.  Cocine con  aceites saludables, como el aceite de [place], canola o [place].  Evite cocinar con manteca, crema o carnes con alto contenido de grasa. Planificacin de las comidas  Coma las comidas y los refrigerios regularmente, preferentemente a la misma hora todos [place]. Evite pasar largos perodos de tiempo sin comer.  Consuma alimentos ricos en fibra, como frutas frescas, verduras, frijoles y cereales integrales. Consulte a su nutricionista sobre cuntas porciones de carbohidratos puede consumir en cada comida.  Consuma entre 4 y 6 onzas (entre 112 y 168g) de protenas magras por da, como carnes magras, pollo, pescado, huevos o tofu. Una onza (oz) de protena magra equivale a: ? 1 onza (28g) de carne, pollo o pescado. ? 1huevo. ?  de taza (62 g) de tofu.  Coma algunos alimentos por da que contengan grasas saludables, como aguacates, frutos secos, semillas y pescado.   Qu alimentos debo comer? Lambert Mody Bayas. Manzanas. Naranjas. Duraznos. Damascos. Ciruelas. Uvas. Mango. Papaya. [place]. Kiwi. Cerezas. [place] Valeda Malm. [place]. Verduras de [employer], que incluyen col rizada, [place], hojas de [place] y de Brandt. Remolachas. Coliflor. Repollo. Brcoli. Zanahorias. Judas verdes. Tomates. Pimientos. Cebollas. Pepinos. Coles de Bruselas. Granos Granos integrales, como panes, galletas, tortillas, cereales y pastas de salvado o integrales. Avena sin azcar. Quinua. Arroz integral o salvaje. Carnes y Psychiatric nurse. Carne de ave sin piel. Cortes magros de ave y carne de res. Tofu. Frutos secos. Semillas. Lcteos Productos lcteos sin grasa o con bajo contenido de [place], [place], yogur y [place]. Es posible que los productos que se enumeran ms [place] no constituyan una lista completa de los alimentos y las bebidas que puede tomar. Consulte a un nutricionista para obtener ms informacin. Qu alimentos debo evitar? Lambert Mody Frutas enlatadas al almbar. Verduras Verduras enlatadas.  Verduras congeladas con mantequilla o salsa de crema. Granos Productos elaborados con [place] y [place], como panes, pastas, bocadillos y cereales. Evite todos los alimentos procesados. Carnes y otras protenas Cortes de carne con alto contenido de Lobbyist. Carne de ave con piel. Carnes empanizadas o fritas. Carne procesada. Evite las grasas saturadas. Lcteos [place]og[place], [place] de los [place] enteros. Bebidas Bebidas azucaradas, como gaseosas o t helado. Es posible que los productos que se enumeran ms [place] no constituyan una lista completa de los alimentos y las bebidas que Nurse, adult. Consulte a un nutricionista para obtener ms informacin. Preguntas para hacerle al mdico  Es necesario que me rena con Radio broadcast assistant en el cuidado de la diabetes?  Es necesario que me rena con un nutricionista?  A qu nmero puedo llamar si tengo preguntas?  Cules son los mejores momentos para controlar la glucemia? Dnde encontrar ms informacin:  Asociacin Estadounidense de la Diabetes (American Diabetes Association): diabetes.org  Academy of Nutrition and Dietetics (Academia de Nutricin y Information systems manager): www.eatright.[employer]X Corporation of Diabetes and Digestive and Kidney Diseases [employer] de la Diabetes y [place] y [place]):  DesMoinesFuneral.dk  Association of Diabetes Care and Education Specialists (Asociacin de Especialistas en Atencin y Educacin sobre la Diabetes): www.diabeteseducator.org Resumen  Es importante tener hbitos alimenticios saludables debido a que sus niveles de Designer, television/film set sangre (glucosa) se ven afectados en gran medida por lo que come y bebe.  Un plan de alimentacin saludable lo ayudar a controlar la glucemia y Theatre manager un estilo de vida saludable.  El mdico puede recomendarle que trabaje con un nutricionista para elaborar el mejor plan para usted.  Tenga en cuenta que los carbohidratos (hidratos de carbono) y el alcohol  tienen efectos inmediatos en sus niveles de glucemia. Es importante contar los carbohidratos que ingiere y consumir alcohol con prudencia. Esta informacin no tiene Marine scientist el consejo del mdico. Asegrese de hacerle al mdico cualquier pregunta que tenga. Document Revised: 07/25/2019 Document Reviewed: 07/25/2019 Elsevier Patient Education  2021 [employer].

## 2020-10-22 NOTE — Telephone Encounter (Signed)
Transition Care Management Unsuccessful Follow-up Telephone Call  Date of discharge and from where:  10/21/2020 from [facility]  Attempts:  1st Attempt  Reason for unsuccessful TCM follow-up call:  Unable to leave message  Nurse Note: Attempted to contact patient to discuss transition of care from inpatient admission.  Patient did not answer the phone.  Unable to leave voicemail as it was not set up.  Will attempt to call again or follow up.

## 2020-10-22 NOTE — Progress Notes (Signed)
Duane Cuevas 65 y.o.   Chief Complaint  Patient presents with  . Hospitalization Follow-up    COPD    HISTORY OF PRESENT ILLNESS: This is a 65 y.o. male here for follow-up of recent hospitalization.  Patient was admitted with COPD exacerbation and bronchitis. Presently on Augmentin.  Also on prednisone. Patient has insulin-dependent diabetic. Feeling better.  Has no complaints today. Physician Discharge Summary  Duane Cuevas OVF:[id number] DOB: Apr 18, 1956 DOA: 10/20/2020  PCP: Duane Pollen, Cuevas  Admit date: 10/20/2020 Discharge date: 10/21/2020  Admitted From: Home  Discharge disposition: Home  Recommendations for Outpatient Follow-Up:    Follow up with your primary care provider in one week.   Check CBC, BMP, magnesium in the next visit  Discharge Diagnosis:   Active Problems:   COPD exacerbation ([place])    Discharge Condition: Improved.  Diet recommendation: Low sodium, heart healthy.  Carbohydrate-modified.    Wound care: None.  Code status: Full.   History of Present Illness:   AngelValle-Perezis a 65 y.o.malewith past medical history of diabetes mellitus, COPD, chronic thrombocytopenia presented to hospital with chest pain and shortness of breath for 4 days.  In the ED patient was given Solu-Medrol DuoNeb Rocephin and Zithromax and was admitted hospital for possible pneumonia and COPD   HPI   Prior to Admission medications   Medication Sig Start Date End Date Taking? Authorizing Provider  acetaminophen (TYLENOL) 500 MG tablet Take 1,000 mg by mouth every 6 (six) hours as needed for moderate pain or headache.   Yes Provider, Historical, Cuevas  albuterol (PROVENTIL) (2.5 MG/3ML) 0.083% nebulizer solution TAKE 3 ML(2.5 MG TOTAL) BY NEBULIZATION EVERY 6(SIX) HOURS AS NEEDED Patient taking differently: Take 2.5 mg by nebulization every 6 (six) hours as needed for wheezing or shortness of breath. 08/05/20  Yes Duane Cuevas, Duane Bloomer, Cuevas  amLODipine (NORVASC) 5 MG tablet Take 1 tablet (5 mg total) by mouth daily. 08/21/20  Yes Duane Coss, NP  amoxicillin-clavulanate (AUGMENTIN) 875-125 MG tablet Take 1 tablet by mouth 2 (two) times daily for 4 days. 10/21/20 10/25/20 Yes Cuevas, Laxman, Cuevas  atorvastatin (LIPITOR) 20 MG tablet Take 1 tablet (20 mg total) by mouth daily. 06/22/20  Yes Duane Cuevas, Duane Bloomer, Cuevas  budesonide-formoterol [facility]) 160-4.5 MCG/ACT inhaler Inhale 2 puffs into the lungs 2 (two) times daily. 06/22/20  Yes Duane Cuevas, Duane Bloomer, Cuevas  glipiZIDE (GLUCOTROL) 10 MG tablet Take 1 tablet (10 mg total) by mouth 2 (two) times daily before a meal. 08/21/20  Yes Duane Coss, NP  guaiFENesin-dextromethorphan (ROBITUSSIN DM) 100-10 MG/5ML syrup Take 5 mLs by mouth every 4 (four) hours as needed for cough (chest congestion). 10/21/20  Yes Cuevas, Laxman, Cuevas  insulin glargine (LANTUS) 100 UNIT/ML Solostar Pen Inject 20 Units into the skin daily. 09/21/20 12/20/20 Yes Duane Cuevas, Duane Bloomer, Cuevas  insulin lispro (HUMALOG) 100 UNIT/ML KwikPen Inject 3 Units into the skin 3 (three) times daily. Adjust amount of insulin as per sliding scale. 09/21/20 12/20/20 Yes Duane Cuevas, Duane Bloomer, Cuevas  pantoprazole (PROTONIX) 40 MG tablet TAKE 1 TABLET BY MOUTH 2 TIMES DAILY BEFORE MEALS 09/16/20 09/16/21 Yes Dahal, Marlowe Aschoff, Cuevas  predniSONE (STERAPRED UNI-PAK 21 TAB) 10 MG (21) TBPK tablet 3 tab orally daily x 2 days then 2 tab po daily x 2 days then 1 tab po daily x 2 days 10/21/20  Yes Cuevas, Laxman, Cuevas  blood glucose meter kit and supplies KIT Dispense based on patient and insurance preference. Use up to four times daily as directed. 09/21/20  Duane Cuevas  glucose blood (ACCU-CHEK GUIDE) test strip Use as instructed 10/19/20   Duane Cuevas  Insulin Pen Needle 32G X 4 MM MISC USE TO INJECT LANTUS AND HUMALOG 4 TIMES DAILY 09/16/20 09/16/21  Duane Glass, Cuevas  metFORMIN (GLUCOPHAGE) 1000 MG tablet Take 1 tablet  (1,000 mg total) by mouth 2 (two) times daily with a meal. 06/22/20 09/20/20  Duane Cuevas    Not on File  Patient Active Problem List   Diagnosis Date Noted  . COPD exacerbation (HCC) 10/20/2020  . Essential hypertension 09/21/2020  . Acute esophagitis   . Acute gastric erosion   . Other dysphagia   . Hyperglycemia 09/13/2020  . Uncontrolled type 2 diabetes mellitus with hyperglycemia (HCC) 09/13/2020  . Musculoskeletal chest pain 04/29/2020  . CAD (coronary artery disease) 01/23/2020  . Hyperlipidemia 11/02/2019  . History of MI (myocardial infarction) 11/02/2019  . Cigarette smoker 08/15/2018  . COPD ? GOLD III/ active smoker 08/14/2018  . History of diet-controlled diabetes 06/30/2018  . COPD (chronic obstructive pulmonary disease) (HCC) 10/14/2014  . Type 2 diabetes mellitus with hyperglycemia, without long-term current use of insulin (HCC) 10/14/2014    Past Medical History:  Diagnosis Date  . Diabetes mellitus (HCC) 10/2014   pt denies being diabetic.   . Emphysema of lung (HCC)   . Emphysema/COPD (HCC) 10/2014  . Hepatic steatosis   . Thrombocytopenia (HCC) 09/2015   platelets in 120s.     Past Surgical History:  Procedure Laterality Date  . BIOPSY  09/16/2020   Procedure: BIOPSY;  Surgeon: Duane Danas, Cuevas;  Location: [facility];  Service: Gastroenterology;;  . ESOPHAGOGASTRODUODENOSCOPY N/A 09/23/2015   Procedure: ESOPHAGOGASTRODUODENOSCOPY (EGD);  Surgeon: Duane Dare, Cuevas;  Location: [facility];  Service: Endoscopy;  Laterality: N/A;  . ESOPHAGOGASTRODUODENOSCOPY (EGD) WITH PROPOFOL N/A 09/16/2020   Procedure: ESOPHAGOGASTRODUODENOSCOPY (EGD) WITH PROPOFOL;  Surgeon: Duane Danas, Cuevas;  Location: [facility];  Service: Gastroenterology;  Laterality: N/A;  . FOOT SURGERY    . INGUINAL HERNIA REPAIR Left 05/15/2018   Procedure: LEFT INGUINAL HERNIA REPAIR WITH MESH;  Surgeon: Duane Bouillon, Cuevas;  Location: [facility] OR;  Service: General;   Laterality: Left;  . INSERTION OF MESH Left 05/15/2018   Procedure: INSERTION OF MESH;  Surgeon: Duane Bouillon, Cuevas;  Location: [facility] OR;  Service: General;  Laterality: Left;  . UPPER GASTROINTESTINAL ENDOSCOPY      Social History   Socioeconomic History  . Marital status: Married    Spouse name: Ronita Hipps  . Number of children: 6  . Years of education: Not on file  . Highest education level: Not on file  Occupational History  . Occupation: Employed    Employer: [employer]  Tobacco Use  . Smoking status: Current Every Day Smoker    Packs/day: 0.15    Years: 47.00    Pack years: 7.05    Types: Cigarettes  . Smokeless tobacco: Never Used  Vaping Use  . Vaping Use: Former  Substance and Sexual Activity  . Alcohol use: Yes    Comment: occ  . Drug use: No  . Sexual activity: Never  Other Topics Concern  . Not on file  Social History Narrative   Lives with wife.  Employed full time as a Designer, fashion/clothing.  6 children.   Social Determinants of Health   Financial Resource Strain: Not on file  Food Insecurity: Not on file  Transportation Needs: Not on file  Physical Activity: Not on file  Stress: Not on file  Social Connections: Not on file  Intimate Partner Violence: Not on file    Family History  Problem Relation Age of Onset  . Emphysema Mother   . Diabetes Mother   . Emphysema Father   . Cancer Sister        Stomach cancer  . Diabetes Sister   . Stomach cancer Sister   . Diabetes Brother   . Colon cancer Neg Hx   . Colon polyps Neg Hx   . Esophageal cancer Neg Hx   . Rectal cancer Neg Hx      Review of Systems  Constitutional: Negative.  Negative for chills and fever.  HENT: Negative.  Negative for congestion and sore throat.   Respiratory: Negative.  Negative for cough and shortness of breath.   Cardiovascular: Negative.  Negative for chest pain and palpitations.  Gastrointestinal: Negative.  Negative for abdominal pain, diarrhea, nausea and vomiting.   Genitourinary: Negative.  Negative for dysuria and hematuria.  Skin: Negative.  Negative for rash.  Neurological: Negative for dizziness and headaches.  All other systems reviewed and are negative.   Today's Vitals   10/22/20 1449  BP: 120/70  Pulse: (!) 105  Temp: 98.6 F (37 C)  TempSrc: Oral  SpO2: 95%  Weight: 167 lb (75.8 kg)  Height: $Remove'5\' 8"'[id number]$  (1.727 m)   Body mass index is 25.39 kg/m.  Physical Exam Vitals reviewed.  Constitutional:      Appearance: Normal appearance.  HENT:     Head: Normocephalic.  Eyes:     Extraocular Movements: Extraocular movements intact.     Conjunctiva/sclera: Conjunctivae normal.     Pupils: Pupils are equal, round, and reactive to light.  Cardiovascular:     Rate and Rhythm: Normal rate and regular rhythm.     Pulses: Normal pulses.     Heart sounds: Normal heart sounds.  Pulmonary:     Effort: Pulmonary effort is normal.     Breath sounds: Normal breath sounds.  Musculoskeletal:        General: Normal range of motion.     Cervical back: Normal range of motion and neck supple.  Skin:    General: Skin is warm and dry.     Capillary Refill: Capillary refill takes less than 2 seconds.  Neurological:     General: No focal deficit present.     Mental Status: He is alert and oriented to person, place, and time.  Psychiatric:        Mood and Affect: Mood normal.        Behavior: Behavior normal.    A total of 30 minutes was spent with the patient, greater than 50% of which was in counseling/coordination of care regarding recent hospital admission, review of discharge summary, review of all medications, review of diabetes medications, review of most recent chest x-ray, review of most recent blood work results, education on nutrition, prognosis, documentation, need for follow-up.    ASSESSMENT & PLAN: Clinically stable.  Progressing well.  No complications.  Tolerating medications well. Tyce was seen today for hospitalization  follow-up.  Diagnoses and all orders for this visit:  Hospital discharge follow-up  Panlobular emphysema ([place]) -     For home use only DME Nebulizer machine  Uncontrolled type 2 diabetes mellitus with hyperglycemia ([place])  Type 2 diabetes mellitus with hyperglycemia, without long-term current use of insulin (HCC) -     blood glucose meter kit and supplies KIT; Dispense based on patient and  insurance preference. Use up to four times daily as directed. -     glucose blood (ACCU-CHEK GUIDE) test strip; Use as instructed    Patient Instructions   Enfermedad pulmonar obstructiva crnica Chronic Obstructive Pulmonary Disease  La enfermedad pulmonar obstructiva crnica (EPOC) es un problema pulmonar de larga duracin (crnico). La EPOC dificulta la entrada y salida de aire de los pulmones. Por lo general, la afeccin empeora con [employer], y los pulmones nunca volvern a la normalidad. Puede hacer algunas cosas para mantenerse lo ms sano posible. Cules son las causas?  Fumar. Esta es la causa ms frecuente.  Ciertos genes que se transmiten de padres a hijos (hereditarios). Qu incrementa el riesgo?  Estar expuesto a humo ambiental de cigarrillos, pipas o cigarros.  Estar expuesto a sustancias qumicas y otras sustancias irritantes, como gases y polvo en el ambiente laboral.  Raynelle Jan afecciones o infecciones pulmonares crnicas. Cules son los signos o sntomas?  Falta de aire, especialmente al realizar actividad fsica.  Tos a largo plazo con una gran cantidad de mucosidad espesa. A veces, es posible que la tos no contenga mucosidad (tos seca).  Sibilancias.  Respirar rpidamente.  Piel con un aspecto gris o azul, especialmente en [place] y los pies, o los labios.  Sensacin de cansancio (fatiga).  Prdida de peso.  Opresin en el pecho.  Tener infecciones con frecuencia.  Episodios en los que los sntomas respiratorios empeoran mucho  (exacerbaciones). En las ltimas etapas de esta enfermedad, puede tener hinchazn de los tobillos, los pies o las piernas. Cmo se trata?  Usar medicamentos.  Si fuma, dejar de hacerlo.  Rehabilitacin. Esto incluye medidas para que el cuerpo funcione mejor. Puede requerir la participacin de un equipo de especialistas.  Hacer ejercicios.  Realizar cambios en la dieta.  Usar oxgeno.  Ciruga pulmonar.  Trasplante de pulmn.  Medidas para el bienestar (cuidados paliativos). Siga estas instrucciones en su casa: Medicamentos  Use los medicamentos de venta libre y los recetados solamente como se lo haya indicado el mdico.  Hable con el mdico antes de tomar cualquier medicamento para la tos o las [place]. Es posible que Product/process development scientist los medicamentos que hagan que los pulmones se sequen. Estilo de vida  Si fuma, deje de fumar. Fumar empeora el problema.  No fume ni consuma ningn producto que contenga nicotina o tabaco. Si necesita ayuda para dejar de fumar, consulte al mdico.  Evite rodearse de factores que empeoran su respiracin. Por ejemplo, humo, sustancias qumicas y vapores.  Mantngase activo, pero recuerde que tambin Administrator.  Aprenda y utilice tcnicas para controlar el estrs y la respiracin.  Asegrese de dormir lo suficiente. La [employer] de los adultos necesitan dormir 7horas como mnimo todas las noches.  Consuma alimentos saludables. Consuma pequeas cantidades de alimentos con mayor frecuencia. Descanse antes de las comidas. Respiracin controlada Aprenda y utilice consejos sobre cmo controlar su respiracin como se lo haya indicado el mdico. Intente lo siguiente:  Inspire (inhale) por la nariz durante 1segundo. Luego frunza los labios y espire (exhale) a travs de los labios durante 2segundos.  Coloque una mano sobre el vientre (abdomen). Inhale lentamente por la nariz durante 1segundo. La mano que est sobre el vientre debe moverse. Frunza  los labios y espire lentamente por los labios. La mano que est sobre su vientre debe moverse cuando espire.   Tos controlada Aprenda a utilizar la tos controlada para despejar la mucosidad de los pulmones. Siga estos pasos: 1.  Incline la cabeza un poco hacia adelante. 2. Inspire profundamente. 3. Trate de mantener el aire por 3 segundos. 4. Mantenga la boca ligeramente abierta mientras tose 2veces. 5. Escupa la mucosidad en un pauelo. 6. Descanse y repita los pasos 1 o 2 veces, segn lo necesite. Instrucciones generales  Asegrese de recibir todas las dosis (vacunas) que el [employer] recomiende. Consulte al [employer] vacunas para la gripe y la neumona.  Use la oxigenoterapia y la rehabilitacin pulmonar si as se lo indica el mdico. Si necesita oxigenoterapia en su casa, pregntele al mdico si debe comprar una herramienta para medir su nivel de oxgeno (oxmetro).  Realice un plan de accin para la EPOC junto a su mdico. Esto lo ayudar a saber qu debe hacer si se siente peor que de costumbre.  Mantenga bajo control cualquier otra afeccin que padezca como se lo haya indicado el mdico.  Evite salir cuando hace mucho calor, fro o humedad.  Evite a las personas que tengan una enfermedad contagiosa.  Concurra a [place]. Comunquese con un mdico si:  Tose y elimina ms mucosidad que lo habitual.  Hay un cambio en el color o en la consistencia de la mucosidad.  Le cuesta respirar ms de lo habitual.  La respiracin es ms rpida de lo habitual.  Tiene dificultad para dormir.  Debe usar sus medicamentos con ms frecuencia que lo habitual.  Tiene dificultad para [employer] cotidianas, como vestirse o caminar por la casa. Solicite ayuda de inmediato si:  Tiene dificultad para Chemical engineer.  Tiene dificultad para respirar y [employer] hacer lo siguiente: ? Poder hablar. ? Hacer actividades normales.  Le  duele el pecho durante ms de 31minutos.  Tiene la piel ms [employer] lo normal.  El pulsioxmetro muestra que tiene un nivel bajo de oxgeno durante ms de 32minutos.  Tiene fiebre.  Se siente demasiado cansado para respirar con normalidad. Estos sntomas pueden representar un problema grave que constituye Engineer, maintenance (IT). No espere a ver si los sntomas desaparecen. Solicite atencin mdica de inmediato. Comunquese con el servicio de emergencias de su localidad (911 en los [place]). No conduzca por sus propios medios [employer] hospital. Resumen  La enfermedad pulmonar obstructiva crnica (EPOC) es un problema pulmonar de larga duracin.  El funcionamiento de los pulmones nunca volver a la normalidad. Por lo general, la enfermedad empeora con el tiempo. Puede hacer algunas cosas para mantenerse lo ms sano posible.  Use los medicamentos de venta libre y los recetados solamente como se lo haya indicado el mdico.  Si fuma, deje de [place]. Fumar empeora el problema. Esta informacin no tiene Marine scientist el consejo del mdico. Asegrese de hacerle al mdico cualquier pregunta que tenga. Document Revised: 05/19/2020 Document Reviewed: 05/19/2020 Elsevier Patient Education  [place]. Diabetes mellitus y nutricin, en adultos Diabetes Mellitus and Nutrition, Adult Si sufre de diabetes, o diabetes mellitus, es muy importante tener hbitos alimenticios saludables debido a que sus niveles de Designer, television/film set sangre (glucosa) se ven afectados en gran medida por lo que come y bebe. [place] alimentos saludables en las cantidades correctas, aproximadamente a la misma hora todos los [place], [place] ayudar a:  Aeronautical engineer glucemia.  Disminuir el riesgo de sufrir una enfermedad cardaca.  Mejorar la presin arterial.  Science writer o mantener un peso saludable. Qu puede afectar mi plan de alimentacin? Todas las personas que sufren de diabetes son diferentes y cada una tiene  necesidades diferentes en cuanto a un plan de alimentacin. El mdico puede recomendarle que trabaje con un nutricionista para elaborar el mejor plan para usted. Su plan de alimentacin puede variar segn factores como:  Las caloras que necesita.  Los medicamentos que toma.  Su peso.  Sus niveles de glucemia, presin arterial y colesterol.  Su nivel de [place].  Otras afecciones que tenga, como enfermedades cardacas o renales. Cmo me afectan los carbohidratos? Los carbohidratos, o hidratos de carbono, afectan su nivel de glucemia ms que cualquier otro tipo de alimento. La ingesta de carbohidratos naturalmente aumenta la cantidad de [employer]. El recuento de carbohidratos es un mtodo destinado a Catering manager un registro de la cantidad de carbohidratos que se consumen. El recuento de carbohidratos es importante para Theatre manager la glucemia a un nivel saludable, especialmente si utiliza insulina o toma determinados medicamentos por va oral para la diabetes. Es importante conocer la cantidad de carbohidratos que se pueden ingerir en cada comida sin correr Engineer, manufacturing. Esto es Psychologist, forensic. Su nutricionista puede ayudarlo a calcular la cantidad de carbohidratos que debe ingerir en cada comida y en cada refrigerio. Cmo me afecta el alcohol? El alcohol puede provocar disminuciones sbitas de la glucemia (hipoglucemia), especialmente si utiliza insulina o toma determinados medicamentos por va oral para la diabetes. La hipoglucemia es una afeccin potencialmente mortal. Los sntomas de la hipoglucemia, como somnolencia, mareos y confusin, son similares a los sntomas de haber consumido demasiado alcohol.  No beba alcohol si: ? Su mdico le indica no hacerlo. ? Est embarazada, puede estar embarazada o est tratando de quedar embarazada.  Si bebe alcohol: ? No beba con el estmago vaco. ? Limite la cantidad que bebe:  De 0 a 1 medida por da para las mujeres.  De 0 a  2 medidas por da para los hombres. ? Est atento a la cantidad de alcohol que hay en las bebidas que toma. En los [place], una medida equivale a una botella de cerveza de 12oz (378ml), un vaso de vino de 5oz (160ml) o un vaso de una bebida alcohlica de alta graduacin de 1oz (68ml). ? Mantngase hidratado bebiendo agua, refrescos dietticos o t helado sin azcar.  Tenga en cuenta que los refrescos comunes, los jugos y otras bebida para Optician, dispensing pueden contener mucha azcar y se deben contar como carbohidratos. Consejos para seguir Photographer las etiquetas de los alimentos  Comience por leer el tamao de la porcin en la "Informacin nutricional" en las etiquetas de los alimentos envasados y las bebidas. La cantidad de caloras, carbohidratos, grasas y otros nutrientes mencionados en la etiqueta se basan en una porcin del alimento. Muchos alimentos contienen ms de una porcin por envase.  Verifique la cantidad total de gramos (g) de carbohidratos totales en una porcin. Puede calcular la cantidad de porciones de carbohidratos al dividir el total de carbohidratos por 15. Por ejemplo, si un alimento tiene un total de 30g de carbohidratos totales por porcin, equivale a 2 porciones de carbohidratos.  Verifique la cantidad de gramos (g) de grasas saturadas y grasas trans de una porcin. Escoja alimentos que no contengan estas grasas o que su contenido de estas sea [place].  Verifique la cantidad de miligramos (mg) de sal (sodio) en una porcin. La mayora de las personas deben limitar la ingesta de sodio total a menos de $Remove'2300mg'[id number]$  por Training and development officer.  Siempre consulte la informacin nutricional de los alimentos etiquetados como "con bajo contenido de grasa" o "sin  grasa". Estos alimentos pueden tener un mayor contenido de Location manager agregada o carbohidratos refinados, y deben evitarse.  Hable con su nutricionista para identificar sus objetivos diarios en cuanto a los nutrientes mencionados en la  etiqueta. Al ir de compras  Evite comprar alimentos procesados, enlatados o precocidos. Estos alimentos tienden a Special educational needs teacher mayor cantidad de [place], sodio y azcar agregada.  Compre en la zona exterior de la tienda de comestibles. Esta es la zona donde se encuentran con mayor frecuencia las frutas y las verduras frescas, los cereales a granel, las carnes frescas y los productos lcteos frescos. Al cocinar  Utilice mtodos de coccin a baja temperatura, como hornear, en lugar de mtodos de coccin a alta temperatura, como frer en abundante aceite.  Cocine con aceites saludables, como el aceite de [place], canola o [place].  Evite cocinar con manteca, crema o carnes con alto contenido de grasa. Planificacin de las comidas  Coma las comidas y los refrigerios regularmente, preferentemente a la misma hora todos [place]. Evite pasar largos perodos de tiempo sin comer.  Consuma alimentos ricos en fibra, como frutas frescas, verduras, frijoles y cereales integrales. Consulte a su nutricionista sobre cuntas porciones de carbohidratos puede consumir en cada comida.  Consuma entre 4 y 6 onzas (entre 112 y 168g) de protenas magras por da, como carnes magras, pollo, pescado, huevos o tofu. Una onza (oz) de protena magra equivale a: ? 1 onza (28g) de carne, pollo o pescado. ? 1huevo. ?  de taza (62 g) de tofu.  Coma algunos alimentos por da que contengan grasas saludables, como aguacates, frutos secos, semillas y pescado.   Qu alimentos debo comer? Lambert Mody Bayas. Manzanas. Naranjas. Duraznos. Damascos. Ciruelas. Uvas. Mango. Papaya. [place]. Kiwi. Cerezas. [place] Valeda Malm. [place]. Verduras de Boeing, que incluyen col rizada, [place], hojas de [place] y de [place]. Remolachas. Coliflor. Repollo. Brcoli. Zanahorias. Judas verdes. Tomates. Pimientos. Cebollas. Pepinos. Coles de Bruselas. Granos Granos integrales, como panes, galletas, tortillas, cereales y pastas de salvado o  integrales. Avena sin azcar. Quinua. Arroz integral o salvaje. Carnes y Psychiatric nurse. Carne de ave sin piel. Cortes magros de ave y carne de res. Tofu. Frutos secos. Semillas. Lcteos Productos lcteos sin grasa o con bajo contenido de [place], [place], yogur y [place]. Es posible que los productos que se enumeran ms [place] no constituyan una lista completa de los alimentos y las bebidas que puede tomar. Consulte a un nutricionista para obtener ms informacin. Qu alimentos debo evitar? Lambert Mody Frutas enlatadas al almbar. Verduras Verduras enlatadas. Verduras congeladas con mantequilla o salsa de crema. Granos Productos elaborados con [place] y [place], como panes, pastas, bocadillos y cereales. Evite todos los alimentos procesados. Carnes y otras protenas Cortes de carne con alto contenido de Lobbyist. Carne de ave con piel. Carnes empanizadas o fritas. Carne procesada. Evite las grasas saturadas. Lcteos Yogur, [place] enteros. Bebidas Bebidas azucaradas, como gaseosas o t helado. Es posible que los productos que se enumeran ms [place] no constituyan una lista completa de los alimentos y las bebidas que Nurse, adult. Consulte a un nutricionista para obtener ms informacin. Preguntas para hacerle al mdico  Es necesario que me rena con Radio broadcast assistant en el cuidado de la diabetes?  Es necesario que me rena con un nutricionista?  A qu nmero puedo llamar si tengo preguntas?  Cules son los mejores momentos para controlar la glucemia? Dnde encontrar ms informacin:  Asociacin Estadounidense de la Diabetes (American Diabetes Association): diabetes.org  Academy of  Nutrition and Dietetics (Academia de Nutricin y Information systems manager): www.eatright.[employer] of Diabetes and Digestive and Kidney Diseases ([place] la Diabetes y [place] y [place]): DesMoinesFuneral.dk  Association of Diabetes Care and Education  Specialists (Asociacin de Especialistas en Atencin y Educacin sobre la Diabetes): www.diabeteseducator.org Resumen  Es importante tener hbitos alimenticios saludables debido a que sus niveles de Designer, television/film set sangre (glucosa) se ven afectados en gran medida por lo que come y bebe.  Un plan de alimentacin saludable lo ayudar a controlar la glucemia y Theatre manager un estilo de vida saludable.  El mdico puede recomendarle que trabaje con un nutricionista para elaborar el mejor plan para usted.  Tenga en cuenta que los carbohidratos (hidratos de carbono) y el alcohol tienen efectos inmediatos en sus niveles de glucemia. Es importante contar los carbohidratos que ingiere y consumir alcohol con prudencia. Esta informacin no tiene Marine scientist el consejo del mdico. Asegrese de hacerle al mdico cualquier pregunta que tenga. Document Revised: 07/25/2019 Document Reviewed: 07/25/2019 Elsevier Patient Education  2021 [place], [place] [facility]

## 2020-10-23 ENCOUNTER — Other Ambulatory Visit: Payer: Self-pay | Admitting: *Deleted

## 2020-10-23 ENCOUNTER — Telehealth: Payer: Self-pay | Admitting: Emergency Medicine

## 2020-10-23 DIAGNOSIS — E1165 Type 2 diabetes mellitus with hyperglycemia: Secondary | ICD-10-CM

## 2020-10-23 LAB — CULTURE, RESPIRATORY W GRAM STAIN: Culture: NORMAL

## 2020-10-23 NOTE — Telephone Encounter (Signed)
insulin lispro (HUMALOG) 100 UNIT/ML KwikPen Requesting refill  Patients family also states he was supposed to get a nebulizer but never received it.  [facility] [address] [place], [place]. AT [place] Phone:  [phone]  Fax:  [phone]

## 2020-10-23 NOTE — Addendum Note (Signed)
Addended by: Alfredia Ferguson A on: 10/23/2020 08:12 AM   Modules accepted: Orders

## 2020-10-24 ENCOUNTER — Other Ambulatory Visit: Payer: Self-pay | Admitting: Emergency Medicine

## 2020-10-24 DIAGNOSIS — E1165 Type 2 diabetes mellitus with hyperglycemia: Secondary | ICD-10-CM

## 2020-10-24 MED ORDER — BLOOD GLUCOSE METER KIT: PACK | 0 refills | Status: DC

## 2020-10-24 NOTE — Telephone Encounter (Signed)
Thanks

## 2020-10-26 ENCOUNTER — Telehealth: Payer: Self-pay | Admitting: *Deleted

## 2020-10-26 ENCOUNTER — Other Ambulatory Visit (HOSPITAL_COMMUNITY): Payer: Self-pay

## 2020-10-26 MED FILL — Pantoprazole Sodium EC Tab 40 MG (Base Equiv): ORAL | 30 days supply | Qty: 60 | Fill #0 | Status: AC

## 2020-10-26 NOTE — Telephone Encounter (Signed)
Patient's medication for insulin was refilled by

## 2020-10-26 NOTE — Telephone Encounter (Signed)
Rx sent for One Touch glucose meter w/supplies sent to [facility], patient's insurance wants this type of meter.

## 2020-10-28 ENCOUNTER — Emergency Department (HOSPITAL_COMMUNITY): Payer: Medicare HMO

## 2020-10-28 ENCOUNTER — Encounter (HOSPITAL_COMMUNITY): Payer: Self-pay

## 2020-10-28 ENCOUNTER — Emergency Department (HOSPITAL_COMMUNITY)
Admission: EM | Admit: 2020-10-28 | Discharge: 2020-10-28 | Disposition: A | Discharge status: Home / Self Care | Payer: Medicare HMO | Attending: Emergency Medicine | Admitting: Emergency Medicine

## 2020-10-28 ENCOUNTER — Other Ambulatory Visit: Payer: Self-pay

## 2020-10-28 DIAGNOSIS — Z79899 Other long term (current) drug therapy: Secondary | ICD-10-CM | POA: Diagnosis not present

## 2020-10-28 DIAGNOSIS — Z794 Long term (current) use of insulin: Secondary | ICD-10-CM | POA: Diagnosis not present

## 2020-10-28 DIAGNOSIS — I2510 Atherosclerotic heart disease of native coronary artery without angina pectoris: Secondary | ICD-10-CM | POA: Insufficient documentation

## 2020-10-28 DIAGNOSIS — R918 Other nonspecific abnormal finding of lung field: Secondary | ICD-10-CM | POA: Diagnosis not present

## 2020-10-28 DIAGNOSIS — R69 Illness, unspecified: Secondary | ICD-10-CM | POA: Diagnosis not present

## 2020-10-28 DIAGNOSIS — F17210 Nicotine dependence, cigarettes, uncomplicated: Secondary | ICD-10-CM | POA: Diagnosis not present

## 2020-10-28 DIAGNOSIS — I10 Essential (primary) hypertension: Secondary | ICD-10-CM | POA: Insufficient documentation

## 2020-10-28 DIAGNOSIS — R0689 Other abnormalities of breathing: Secondary | ICD-10-CM | POA: Diagnosis not present

## 2020-10-28 DIAGNOSIS — Z7984 Long term (current) use of oral hypoglycemic drugs: Secondary | ICD-10-CM | POA: Insufficient documentation

## 2020-10-28 DIAGNOSIS — J9811 Atelectasis: Secondary | ICD-10-CM | POA: Diagnosis not present

## 2020-10-28 DIAGNOSIS — E1169 Type 2 diabetes mellitus with other specified complication: Secondary | ICD-10-CM | POA: Insufficient documentation

## 2020-10-28 DIAGNOSIS — J1089 Influenza due to other identified influenza virus with other manifestations: Secondary | ICD-10-CM | POA: Diagnosis not present

## 2020-10-28 DIAGNOSIS — J101 Influenza due to other identified influenza virus with other respiratory manifestations: Secondary | ICD-10-CM

## 2020-10-28 DIAGNOSIS — J441 Chronic obstructive pulmonary disease with (acute) exacerbation: Secondary | ICD-10-CM

## 2020-10-28 DIAGNOSIS — R000 Tachycardia, unspecified: Secondary | ICD-10-CM | POA: Diagnosis not present

## 2020-10-28 DIAGNOSIS — R0602 Shortness of breath: Secondary | ICD-10-CM | POA: Diagnosis not present

## 2020-10-28 DIAGNOSIS — E785 Hyperlipidemia, unspecified: Secondary | ICD-10-CM | POA: Diagnosis not present

## 2020-10-28 DIAGNOSIS — Z20822 Contact with and (suspected) exposure to covid-19: Secondary | ICD-10-CM | POA: Insufficient documentation

## 2020-10-28 DIAGNOSIS — R509 Fever, unspecified: Secondary | ICD-10-CM | POA: Diagnosis not present

## 2020-10-28 DIAGNOSIS — I213 ST elevation (STEMI) myocardial infarction of unspecified site: Secondary | ICD-10-CM | POA: Diagnosis not present

## 2020-10-28 DIAGNOSIS — J80 Acute respiratory distress syndrome: Secondary | ICD-10-CM | POA: Diagnosis not present

## 2020-10-28 LAB — CBC WITH DIFFERENTIAL/PLATELET
Abs Immature Granulocytes: 0.24 10*3/uL — ABNORMAL HIGH (ref 0.00–0.07)
Basophils Absolute: 0 10*3/uL (ref 0.0–0.1)
Basophils Relative: 0 %
Eosinophils Absolute: 0.2 10*3/uL (ref 0.0–0.5)
Eosinophils Relative: 1 %
HCT: 39.5 % (ref 39.0–52.0)
Hemoglobin: 13.1 g/dL (ref 13.0–17.0)
Immature Granulocytes: 2 %
Lymphocytes Relative: 15 %
Lymphs Abs: 2 10*3/uL (ref 0.7–4.0)
MCH: 30.9 pg (ref 26.0–34.0)
MCHC: 33.2 g/dL (ref 30.0–36.0)
MCV: 93.2 fL (ref 80.0–100.0)
Monocytes Absolute: 0.8 10*3/uL (ref 0.1–1.0)
Monocytes Relative: 6 %
Neutro Abs: 10.6 10*3/uL — ABNORMAL HIGH (ref 1.7–7.7)
Neutrophils Relative %: 76 %
Platelets: 245 10*3/uL (ref 150–400)
RBC: 4.24 MIL/uL (ref 4.22–5.81)
RDW: 12.8 % (ref 11.5–15.5)
WBC: 13.9 10*3/uL — ABNORMAL HIGH (ref 4.0–10.5)
nRBC: 0 % (ref 0.0–0.2)

## 2020-10-28 LAB — BASIC METABOLIC PANEL
Anion gap: 10 (ref 5–15)
BUN: 19 mg/dL (ref 8–23)
CO2: 26 mmol/L (ref 22–32)
Calcium: 8.1 mg/dL — ABNORMAL LOW (ref 8.9–10.3)
Chloride: 101 mmol/L (ref 98–111)
Creatinine, Ser: 1.1 mg/dL (ref 0.61–1.24)
GFR, Estimated: >60 mL/min (ref >60)
Glucose, Bld: 331 mg/dL — ABNORMAL HIGH (ref 70–99)
Potassium: 3.7 mmol/L (ref 3.5–5.1)
Sodium: 137 mmol/L (ref 135–145)

## 2020-10-28 LAB — RESP PANEL BY RT-PCR (FLU A&B, COVID) ARPGX2
Influenza A by PCR: POSITIVE — AB
Influenza B by PCR: NEGATIVE
SARS Coronavirus 2 by RT PCR: NEGATIVE

## 2020-10-28 LAB — TROPONIN I (HIGH SENSITIVITY): Troponin I (High Sensitivity): 5 ng/L (ref <18)

## 2020-10-28 MED ORDER — ONDANSETRON 4 MG PO TBDP: ORAL_TABLET | ORAL | 0 refills | Status: DC

## 2020-10-28 MED ORDER — OSELTAMIVIR PHOSPHATE 75 MG PO CAPS: 75.0000 mg | ORAL_CAPSULE | Freq: Two times a day (BID) | ORAL | 0 refills | Status: DC

## 2020-10-28 MED ORDER — ALBUTEROL SULFATE HFA 108 (90 BASE) MCG/ACT IN AERS
8.0000 | INHALATION_SPRAY | Freq: Once | RESPIRATORY_TRACT | Status: AC
  Administered 2020-10-28: 8 via RESPIRATORY_TRACT
  Filled 2020-10-28 (×2): qty 6.7

## 2020-10-28 MED ORDER — BENZONATATE 100 MG PO CAPS: 100.0000 mg | ORAL_CAPSULE | Freq: Three times a day (TID) | ORAL | 0 refills | Status: DC

## 2020-10-28 MED ORDER — ALBUTEROL (5 MG/ML) CONTINUOUS INHALATION SOLN: 10.0000 mg/h | INHALATION_SOLUTION | RESPIRATORY_TRACT | Status: DC

## 2020-10-28 MED ORDER — PREDNISONE 20 MG PO TABS: ORAL_TABLET | ORAL | 0 refills | Status: DC

## 2020-10-28 NOTE — ED Provider Notes (Signed)
[facility] DEPT Provider Note   CSN: [id number] Arrival date & time: 10/28/20  1925     History Chief Complaint  Patient presents with  . Shortness of Breath    Duane Cuevas is a 65 y.o. male.  65 yo M with a chief complaints of cough and shortness of breath.  Going on for about 2 or 3 days now.  Was seen and found to have bronchitis.  Had worsening symptoms this evening and was picked up by EMS.  Felt to have a respiratory rate into the 40s he was given 2 DuoNeb's back-to-back Solu-Medrol and magnesium.  Has had some improvement.  Feels like his chest is on fire and then he has been having some persistent coughing.  Feels similar to his prior COPD exacerbations.  The history is provided by the patient.  Shortness of Breath Severity:  Moderate Onset quality:  Gradual Timing:  Constant Progression:  Worsening Chronicity:  New Relieved by:  Nothing Worsened by:  Nothing Ineffective treatments:  None tried Associated symptoms: cough   Associated symptoms: no abdominal pain, no chest pain, no fever, no headaches, no rash and no vomiting        Past Medical History:  Diagnosis Date  . Diabetes mellitus ([place]) 10/2014   pt denies being diabetic.   . Emphysema of lung ([place])   . Emphysema/COPD ([place]) 10/2014  . Hepatic steatosis   . Thrombocytopenia ([place]) 09/2015   platelets in 120s.     Patient Active Problem List   Diagnosis Date Noted  . COPD exacerbation ([place]) 10/20/2020  . Essential hypertension 09/21/2020  . Acute esophagitis   . Acute gastric erosion   . Other dysphagia   . Hyperglycemia 09/13/2020  . Uncontrolled type 2 diabetes mellitus with hyperglycemia ([place]) 09/13/2020  . Musculoskeletal chest pain 04/29/2020  . CAD (coronary artery disease) 01/23/2020  . Hyperlipidemia 11/02/2019  . History of MI (myocardial infarction) 11/02/2019  . Cigarette smoker 08/15/2018  . COPD ? GOLD III/ active smoker 08/14/2018  . History of  diet-controlled diabetes 06/30/2018  . COPD (chronic obstructive pulmonary disease) ([place]) 10/14/2014  . Type 2 diabetes mellitus with hyperglycemia, without long-term current use of insulin ([place]) 10/14/2014    Past Surgical History:  Procedure Laterality Date  . BIOPSY  09/16/2020   Procedure: BIOPSY;  Surgeon: Thornton Park, MD;  Location: [facility];  Service: Gastroenterology;;  . ESOPHAGOGASTRODUODENOSCOPY N/A 09/23/2015   Procedure: ESOPHAGOGASTRODUODENOSCOPY (EGD);  Surgeon: Ladene Artist, MD;  Location: [facility];  Service: Endoscopy;  Laterality: N/A;  . ESOPHAGOGASTRODUODENOSCOPY (EGD) WITH PROPOFOL N/A 09/16/2020   Procedure: ESOPHAGOGASTRODUODENOSCOPY (EGD) WITH PROPOFOL;  Surgeon: Thornton Park, MD;  Location: [facility];  Service: Gastroenterology;  Laterality: N/A;  . FOOT SURGERY    . INGUINAL HERNIA REPAIR Left 05/15/2018   Procedure: LEFT INGUINAL HERNIA REPAIR WITH MESH;  Surgeon: Erroll Luna, MD;  Location: [facility];  Service: General;  Laterality: Left;  . INSERTION OF MESH Left 05/15/2018   Procedure: INSERTION OF MESH;  Surgeon: Erroll Luna, MD;  Location: [facility];  Service: General;  Laterality: Left;  . UPPER GASTROINTESTINAL ENDOSCOPY         Family History  Problem Relation Age of Onset  . Emphysema Mother   . Diabetes Mother   . Emphysema Father   . Cancer Sister        Stomach cancer  . Diabetes Sister   . Stomach cancer Sister   . Diabetes Brother   . Colon  cancer Neg Hx   . Colon polyps Neg Hx   . Esophageal cancer Neg Hx   . Rectal cancer Neg Hx     Social History   Tobacco Use  . Smoking status: Current Every Day Smoker    Packs/day: 0.15    Years: 47.00    Pack years: 7.05    Types: Cigarettes  . Smokeless tobacco: Never Used  Vaping Use  . Vaping Use: Former  Substance Use Topics  . Alcohol use: Yes    Comment: occ  . Drug use: No    Home Medications Prior to Admission medications   Medication Sig Start Date  End Date Taking? Authorizing Provider  albuterol (PROVENTIL) (2.5 MG/3ML) 0.083% nebulizer solution TAKE 3 ML(2.5 MG TOTAL) BY NEBULIZATION EVERY 6(SIX) HOURS AS NEEDED Patient taking differently: Take 2.5 mg by nebulization every 6 (six) hours as needed for wheezing or shortness of breath. 08/05/20  Yes Sagardia, Ines Bloomer, MD  amLODipine (NORVASC) 5 MG tablet Take 1 tablet (5 mg total) by mouth daily. 08/21/20  Yes Maximiano Coss, NP  atorvastatin (LIPITOR) 20 MG tablet Take 1 tablet (20 mg total) by mouth daily. 06/22/20  Yes Sagardia, Ines Bloomer, MD  benzonatate (TESSALON) 100 MG capsule Take 1 capsule (100 mg total) by mouth every 8 (eight) hours. 10/28/20  Yes Deno Etienne, DO  budesonide-formoterol (SYMBICORT) 160-4.5 MCG/ACT inhaler Inhale 2 puffs into the lungs 2 (two) times daily. 06/22/20  Yes Sagardia, Ines Bloomer, MD  glipiZIDE (GLUCOTROL) 10 MG tablet Take 1 tablet (10 mg total) by mouth 2 (two) times daily before a meal. Patient taking differently: Take 10 mg by mouth daily before breakfast. Takes 1 more tablet if blood sugar is high 08/21/20  Yes Maximiano Coss, NP  guaiFENesin-dextromethorphan (ROBITUSSIN DM) 100-10 MG/5ML syrup Take 5 mLs by mouth every 4 (four) hours as needed for cough (chest congestion). 10/21/20  Yes Pokhrel, [place], MD  insulin glargine (LANTUS) 100 UNIT/ML Solostar Pen Inject 20 Units into the skin daily. 09/21/20 12/20/20 Yes Sagardia, Ines Bloomer, MD  insulin lispro (HUMALOG) 100 UNIT/ML KwikPen Inject 3 Units into the skin 3 (three) times daily. Adjust amount of insulin as per sliding scale. Patient taking differently: Inject 3-15 Units into the skin 3 (three) times daily. Adjust amount of insulin as per sliding scale. 09/21/20 12/20/20 Yes [name], MD  metFORMIN (GLUCOPHAGE) 1000 MG tablet Take 1 tablet (1,000 mg total) by mouth 2 (two) times daily with a meal. 06/22/20 09/20/20 Yes Sagardia, Ines Bloomer, MD  ondansetron (ZOFRAN ODT) 4 MG  disintegrating tablet 49m ODT q4 hours prn nausea/vomit 10/28/20  Yes FDeno Etienne DO  oseltamivir (TAMIFLU) 75 MG capsule Take 1 capsule (75 mg total) by mouth every 12 (twelve) hours. 10/28/20  Yes FDeno Etienne DO  pantoprazole (PROTONIX) 40 MG tablet TAKE 1 TABLET BY MOUTH 2 TIMES DAILY BEFORE MEALS Patient taking differently: Take 40 mg by mouth 2 (two) times daily. 09/16/20 09/16/21 Yes Dahal, BMarlowe Aschoff MD  predniSONE (DELTASONE) 20 MG tablet 2 tabs po daily x 4 days 10/28/20  Yes FDeno Etienne DO  blood glucose meter kit and supplies KIT Dispense based on patient and insurance preference. Use up to four times daily as directed. 10/22/20   SHorald Pollen MD  blood glucose meter kit and supplies Dispense based on patient and insurance preference. Use up to four times daily as directed. (FOR ICD-10 E10.9, E11.9). 10/24/20   SHorald Pollen MD  glucose blood (ACCU-CHEK GUIDE) test strip  Use as instructed 10/22/20   [name], MD  Insulin Pen Needle 32G X 4 MM MISC USE TO INJECT LANTUS AND HUMALOG 4 TIMES DAILY 09/16/20 09/16/21  Terrilee Croak, MD    Allergies    Patient has no known allergies.  Review of Systems   Review of Systems  Constitutional: Negative for chills and fever.  HENT: Negative for congestion and facial swelling.   Eyes: Negative for discharge and visual disturbance.  Respiratory: Positive for cough and shortness of breath.   Cardiovascular: Negative for chest pain and palpitations.  Gastrointestinal: Negative for abdominal pain, diarrhea and vomiting.  Musculoskeletal: Negative for arthralgias and myalgias.  Skin: Negative for color change and rash.  Neurological: Negative for tremors, syncope and headaches.  Psychiatric/Behavioral: Negative for confusion and dysphoric mood.    Physical Exam Updated Vital Signs BP 115/72   Pulse 88   Temp 98.8 F (37.1 C)   Resp (!) 24   SpO2 92%   Physical Exam Vitals and nursing note reviewed.  Constitutional:       Appearance: He is well-developed.  HENT:     Head: Normocephalic and atraumatic.  Eyes:     Pupils: Pupils are equal, round, and reactive to light.  Neck:     Vascular: No JVD.  Cardiovascular:     Rate and Rhythm: Regular rhythm. Tachycardia present.     Heart sounds: No murmur heard. No friction rub. No gallop.   Pulmonary:     Effort: No respiratory distress.     Breath sounds: Decreased breath sounds present. No wheezing.     Comments: Diminished breath sounds in all fields.  Tachypnea. Abdominal:     General: There is no distension.     Tenderness: There is no guarding or rebound.  Musculoskeletal:        General: Normal range of motion.     Cervical back: Normal range of motion and neck supple.  Skin:    Coloration: Skin is not pale.     Findings: No rash.  Neurological:     Mental Status: He is alert and oriented to person, place, and time.  Psychiatric:        Behavior: Behavior normal.     ED Results / Procedures / Treatments   Labs (all labs ordered are listed, but only abnormal results are displayed) Labs Reviewed  RESP PANEL BY RT-PCR (FLU A&B, COVID) ARPGX2 - Abnormal; Notable for the following components:      Result Value   Influenza A by PCR POSITIVE (*)    All other components within normal limits  CBC WITH DIFFERENTIAL/PLATELET - Abnormal; Notable for the following components:   WBC 13.9 (*)    Neutro Abs 10.6 (*)    Abs Immature Granulocytes 0.24 (*)    All other components within normal limits  BASIC METABOLIC PANEL - Abnormal; Notable for the following components:   Glucose, Bld 331 (*)    Calcium 8.1 (*)    All other components within normal limits  TROPONIN I (HIGH SENSITIVITY)    EKG None  Radiology DG Chest Port 1 View  Result Date: 10/28/2020 CLINICAL DATA:  Shortness of breath EXAM: PORTABLE CHEST 1 VIEW COMPARISON:  October 20, 2020 FINDINGS: The heart size and mediastinal contours are unchanged. Right basilar opacity. Left  basilar linear atelectasis for scarring. No significant pleural effusion or visible pneumothorax. The visualized skeletal structures are unchanged. IMPRESSION: Right basilar opacity, atelectasis versus infiltrate. Electronically Signed   By: Dellis Filbert  Nance Pew MD   On: 10/28/2020 20:35    Procedures Procedures   Medications Ordered in ED Medications  albuterol (PROVENTIL,VENTOLIN) solution continuous neb (has no administration in time range)  albuterol (VENTOLIN HFA) 108 (90 Base) MCG/ACT inhaler 8 puff (8 puffs Inhalation Given 10/28/20 2107)    ED Course  I have reviewed the triage vital signs and the nursing notes.  Pertinent labs & imaging results that were available during my care of the patient were reviewed by me and considered in my medical decision making (see chart for details).    MDM Rules/Calculators/A&P                          65 yo M with a chief complaint of cough and shortness of breath.  Going on for a few days.  Significant improvement with EMS.  We will give a continuous albuterol treatment.  Chest x-ray blood work.  Reassess.  Chest x-ray with possible right lower lobe infiltrate.  Feeling better on reassessment. Flu A +.  Feeling better would like to try going home.  We will start on tamiflu, steroids.    9:42 PM:  I have discussed the diagnosis/risks/treatment options with the patient and believe the pt to be eligible for discharge home to follow-up with PCP. We also discussed returning to the ED immediately if new or worsening sx occur. We discussed the sx which are most concerning (e.g., sudden worsening pain, fever, inability to tolerate by mouth) that necessitate immediate return. Medications administered to the patient during their visit and any new prescriptions provided to the patient are listed below.  Medications given during this visit Medications  albuterol (PROVENTIL,VENTOLIN) solution continuous neb (has no administration in time range)  albuterol  (VENTOLIN HFA) 108 (90 Base) MCG/ACT inhaler 8 puff (8 puffs Inhalation Given 10/28/20 2107)     The patient appears reasonably screen and/or stabilized for discharge and I doubt any other medical condition or other [facility] requiring further screening, evaluation, or treatment in the ED at this time prior to discharge.  Final Clinical Impression(s) / ED Diagnoses Final diagnoses:  COPD exacerbation ([place])  Influenza A    Rx / DC Orders ED Discharge Orders         Ordered    predniSONE (DELTASONE) 20 MG tablet        10/28/20 2141    oseltamivir (TAMIFLU) 75 MG capsule  Every 12 hours        10/28/20 2141    ondansetron (ZOFRAN ODT) 4 MG disintegrating tablet        10/28/20 2141    benzonatate (TESSALON) 100 MG capsule  Every 8 hours        10/28/20 2141           Deno Etienne, DO 10/28/20 2142

## 2020-10-28 NOTE — ED Triage Notes (Signed)
Pt BIB [facility] from home. Pt has hx of asthma and COPD, recent diagnosis of Bronchitis. Pt Was having difficulity breathing at home, RR was 40, O2 was 92% on RA. EMS administered 2 duonebs, 125 of solumedrol and 2g of mag. Pt is A&Ox4.

## 2020-10-28 NOTE — Discharge Instructions (Signed)
Use your inhaler every 4 hours(6 puffs) while awake, return for sudden worsening shortness of breath, or if you need to use your inhaler more often.  ° °

## 2020-10-28 NOTE — ED Notes (Signed)
Family member at bedside.

## 2020-10-28 NOTE — ED Notes (Signed)
Spoke with respiratory regarding Albuterol continuous neb. Need a negative COVID result to administer Neb.

## 2020-11-02 ENCOUNTER — Other Ambulatory Visit: Payer: Self-pay | Admitting: Emergency Medicine

## 2020-11-02 ENCOUNTER — Other Ambulatory Visit (HOSPITAL_COMMUNITY): Payer: Self-pay

## 2020-11-02 ENCOUNTER — Other Ambulatory Visit: Payer: Self-pay | Admitting: Internal Medicine

## 2020-11-02 DIAGNOSIS — E1165 Type 2 diabetes mellitus with hyperglycemia: Secondary | ICD-10-CM

## 2020-11-02 MED ORDER — INSULIN LISPRO (1 UNIT DIAL) 100 UNIT/ML (KWIKPEN)
PEN_INJECTOR | SUBCUTANEOUS | 0 refills | Status: DC
  Filled 2020-11-02: qty 3, 28d supply, fill #0

## 2020-11-02 NOTE — Telephone Encounter (Signed)
    Patients daughter calling requesting call back from [facility] She states patient needs a nebulizer machine and refill for Lantus  Please call

## 2020-11-03 ENCOUNTER — Other Ambulatory Visit (HOSPITAL_COMMUNITY): Payer: Self-pay

## 2020-11-04 ENCOUNTER — Ambulatory Visit: Payer: Medicare HMO | Admitting: Registered"

## 2020-11-05 ENCOUNTER — Other Ambulatory Visit: Payer: Self-pay | Admitting: Emergency Medicine

## 2020-11-05 ENCOUNTER — Telehealth: Payer: Self-pay | Admitting: *Deleted

## 2020-11-05 DIAGNOSIS — E1165 Type 2 diabetes mellitus with hyperglycemia: Secondary | ICD-10-CM

## 2020-11-05 DIAGNOSIS — J431 Panlobular emphysema: Secondary | ICD-10-CM

## 2020-11-05 MED ORDER — INSULIN GLARGINE 100 UNIT/ML SOLOSTAR PEN: 20.0000 [IU] | PEN_INJECTOR | Freq: Every day | SUBCUTANEOUS | 3 refills | Status: DC

## 2020-11-05 NOTE — Addendum Note (Signed)
Addended by: Alfredia Ferguson A on: 11/05/2020 02:18 PM   Modules accepted: Orders

## 2020-11-05 NOTE — Telephone Encounter (Signed)
Faxed order to [facility] for nebulizer machine.

## 2020-11-05 NOTE — Telephone Encounter (Signed)
This prescription was already sent.  Thanks.

## 2020-11-09 DIAGNOSIS — H5203 Hypermetropia, bilateral: Secondary | ICD-10-CM | POA: Diagnosis not present

## 2020-11-09 DIAGNOSIS — E119 Type 2 diabetes mellitus without complications: Secondary | ICD-10-CM | POA: Diagnosis not present

## 2020-11-09 DIAGNOSIS — H524 Presbyopia: Secondary | ICD-10-CM | POA: Diagnosis not present

## 2020-11-09 DIAGNOSIS — H2513 Age-related nuclear cataract, bilateral: Secondary | ICD-10-CM | POA: Diagnosis not present

## 2020-11-09 DIAGNOSIS — H52203 Unspecified astigmatism, bilateral: Secondary | ICD-10-CM | POA: Diagnosis not present

## 2020-11-09 LAB — HM DIABETES EYE EXAM

## 2020-11-12 ENCOUNTER — Other Ambulatory Visit: Payer: Self-pay | Admitting: Emergency Medicine

## 2020-11-13 DIAGNOSIS — H52209 Unspecified astigmatism, unspecified eye: Secondary | ICD-10-CM | POA: Diagnosis not present

## 2020-11-13 DIAGNOSIS — H5203 Hypermetropia, bilateral: Secondary | ICD-10-CM | POA: Diagnosis not present

## 2020-11-13 DIAGNOSIS — H524 Presbyopia: Secondary | ICD-10-CM | POA: Diagnosis not present

## 2020-11-16 ENCOUNTER — Other Ambulatory Visit: Payer: Self-pay | Admitting: Emergency Medicine

## 2020-11-16 DIAGNOSIS — E1165 Type 2 diabetes mellitus with hyperglycemia: Secondary | ICD-10-CM

## 2020-11-18 ENCOUNTER — Encounter: Payer: Self-pay | Admitting: Emergency Medicine

## 2020-11-23 ENCOUNTER — Encounter: Payer: Self-pay | Admitting: Emergency Medicine

## 2020-12-07 ENCOUNTER — Other Ambulatory Visit: Payer: Self-pay | Admitting: Emergency Medicine

## 2020-12-07 DIAGNOSIS — J449 Chronic obstructive pulmonary disease, unspecified: Secondary | ICD-10-CM

## 2020-12-22 ENCOUNTER — Encounter: Payer: Self-pay | Admitting: Emergency Medicine

## 2020-12-22 ENCOUNTER — Ambulatory Visit (INDEPENDENT_AMBULATORY_CARE_PROVIDER_SITE_OTHER): Payer: Medicare HMO | Admitting: Emergency Medicine

## 2020-12-22 ENCOUNTER — Other Ambulatory Visit: Payer: Self-pay

## 2020-12-22 VITALS — BP 120/68 | HR 106 | Temp 98.4°F | Ht 68.0 in | Wt 167.4 lb

## 2020-12-22 DIAGNOSIS — J431 Panlobular emphysema: Secondary | ICD-10-CM | POA: Diagnosis not present

## 2020-12-22 DIAGNOSIS — I700 Atherosclerosis of aorta: Secondary | ICD-10-CM | POA: Diagnosis not present

## 2020-12-22 DIAGNOSIS — E1159 Type 2 diabetes mellitus with other circulatory complications: Secondary | ICD-10-CM | POA: Diagnosis not present

## 2020-12-22 DIAGNOSIS — J441 Chronic obstructive pulmonary disease with (acute) exacerbation: Secondary | ICD-10-CM | POA: Diagnosis not present

## 2020-12-22 DIAGNOSIS — E1165 Type 2 diabetes mellitus with hyperglycemia: Secondary | ICD-10-CM

## 2020-12-22 DIAGNOSIS — I10 Essential (primary) hypertension: Secondary | ICD-10-CM | POA: Diagnosis not present

## 2020-12-22 DIAGNOSIS — I152 Hypertension secondary to endocrine disorders: Secondary | ICD-10-CM

## 2020-12-22 LAB — POCT GLYCOSYLATED HEMOGLOBIN (HGB A1C): Hemoglobin A1C: 7.5 % — AB (ref 4.0–5.6)

## 2020-12-22 MED ORDER — METHYLPREDNISOLONE 4 MG PO TBPK
ORAL_TABLET | ORAL | 1 refills | Status: DC
Start: 2020-12-22 — End: 2021-03-24

## 2020-12-22 MED ORDER — ALBUTEROL SULFATE HFA 108 (90 BASE) MCG/ACT IN AERS: 2.0000 | INHALATION_SPRAY | Freq: Four times a day (QID) | RESPIRATORY_TRACT | 3 refills | Status: DC | PRN

## 2020-12-22 MED ORDER — AMLODIPINE BESYLATE 5 MG PO TABS: 5.0000 mg | ORAL_TABLET | Freq: Every day | ORAL | 3 refills | Status: DC

## 2020-12-22 MED ORDER — METHYLPREDNISOLONE ACETATE 80 MG/ML IJ SUSP
80.0000 mg | Freq: Once | INTRAMUSCULAR | Status: AC
  Administered 2020-12-22: 80 mg via INTRAMUSCULAR

## 2020-12-22 NOTE — Assessment & Plan Note (Signed)
Well-controlled hypertension. Continue amlodipine 5 mg daily. 

## 2020-12-22 NOTE — Assessment & Plan Note (Signed)
Much improved diabetes with hemoglobin A1c of 7.5. Continue glipizide 10 mg twice a day and continue present insulin regimen with 20 units of Lantus insulin in the morning and Premeal lispro insulin as per sliding scale. Diet and nutrition discussed. Follow-up in 3 months.

## 2020-12-22 NOTE — Assessment & Plan Note (Signed)
Having mild exacerbation at present time.  Continue Symbicort twice a day. Will start corticosteroids today.  Depo-Medrol shot in the office and Medrol Dosepak for the next several days.  No signs of infection.  Afebrile.

## 2020-12-22 NOTE — Progress Notes (Signed)
Duane Cuevas 65 y.o.   Chief Complaint  Patient presents with   Diabetes    Follow up 3 months    HISTORY OF PRESENT ILLNESS: This is a 65 y.o. male with history of COPD, hypertension, and diabetes here for follow-up. #1 hypertension: On amlodipine 5 mg daily.  Needs a refill #2 COPD: On Symbicort 2 puffs twice a day.  Ran out of albuterol.  Needs albuterol inhaler and also needs nebulizer machine.  Gets intermittent episodes of dyspnea with chest tightness.  Has been to the emergency room for the same several times. #3 diabetes on glipizide 10 mg twice a day and Lantus insulin 20 units in the morning and Premeal insulin lispro as needed guided by sliding scale.  Diabetes Pertinent negatives for hypoglycemia include no dizziness or headaches. Pertinent negatives for diabetes include no chest pain.  Chest Pain  Associated symptoms include shortness of breath. Pertinent negatives include no abdominal pain, cough, dizziness, fever, headaches, nausea, palpitations or vomiting.    Prior to Admission medications   Medication Sig Start Date End Date Taking? Authorizing Provider  albuterol (PROVENTIL) (2.5 MG/3ML) 0.083% nebulizer solution Take 3 mLs (2.5 mg total) by nebulization every 6 (six) hours as needed for wheezing or shortness of breath. 12/07/20   [name], MD  amLODipine (NORVASC) 5 MG tablet Take 1 tablet (5 mg total) by mouth daily. 08/21/20   Maximiano Coss, NP  atorvastatin (LIPITOR) 20 MG tablet TAKE ONE TABLET BY MOUTH ONE TIME DAILY 11/16/20   [name], MD  benzonatate (TESSALON) 100 MG capsule Take 1 capsule (100 mg total) by mouth every 8 (eight) hours. 10/28/20   Deno Etienne, DO  blood glucose meter kit and supplies KIT Dispense based on patient and insurance preference. Use up to four times daily as directed. 10/22/20   [name], MD  blood glucose meter kit and supplies Dispense based on patient and insurance preference. Use up to four  times daily as directed. (FOR ICD-10 E10.9, E11.9). 10/24/20   [name], MD  budesonide-formoterol [facility]) 160-4.5 MCG/ACT inhaler Inhale 2 puffs into the lungs 2 (two) times daily. 06/22/20   [name], MD  glipiZIDE (GLUCOTROL) 10 MG tablet Take 1 tablet (10 mg total) by mouth 2 (two) times daily before a meal. Patient taking differently: Take 10 mg by mouth daily before breakfast. Takes 1 more tablet if blood sugar is high 08/21/20   Maximiano Coss, NP  glipiZIDE (GLUCOTROL) 5 MG tablet TAKE ONE TABLET BY MOUTH ONE TIME DAILY WITH BREAKFAST 11/12/20   [name], MD  glucose blood (ACCU-CHEK GUIDE) test strip Use as instructed 10/22/20   [name], MD  guaiFENesin-dextromethorphan (ROBITUSSIN DM) 100-10 MG/5ML syrup Take 5 mLs by mouth every 4 (four) hours as needed for cough (chest congestion). 10/21/20   Pokhrel, Corrie Mckusick, MD  insulin glargine (LANTUS) 100 UNIT/ML Solostar Pen Inject 20 Units into the skin daily. 11/05/20 02/03/21  [name], MD  insulin lispro (HUMALOG) 100 UNIT/ML KwikPen Inject 3 Units into the skin 3 (three) times daily. Adjust amount of insulin as per sliding scale. Patient taking differently: Inject 3-15 Units into the skin 3 (three) times daily. Adjust amount of insulin as per sliding scale. 09/21/20 12/20/20  [name], MD  insulin lispro (HUMALOG) 100 UNIT/ML KwikPen INJECT 3 UNITS INTO THE SKIN 3 TIMES DAILY AS DIRECTED. (DISCARD PEN AFTER 28 DAYS) 09/16/20   Terrilee Croak, MD  Insulin Pen Needle 32G  X 4 MM MISC USE TO INJECT LANTUS AND HUMALOG 4 TIMES DAILY 09/16/20 09/16/21  Terrilee Croak, MD  metFORMIN (GLUCOPHAGE) 1000 MG tablet Take 1 tablet (1,000 mg total) by mouth 2 (two) times daily with a meal. 06/22/20 09/20/20  [name], MD  ondansetron (ZOFRAN ODT) 4 MG disintegrating tablet 4mg  ODT q4 hours prn nausea/vomit 10/28/20   Deno Etienne, DO  oseltamivir (TAMIFLU) 75 MG capsule Take 1 capsule  (75 mg total) by mouth every 12 (twelve) hours. 10/28/20   Deno Etienne, DO  pantoprazole (PROTONIX) 40 MG tablet TAKE 1 TABLET BY MOUTH 2 TIMES DAILY BEFORE MEALS Patient taking differently: Take 40 mg by mouth 2 (two) times daily. 09/16/20 09/16/21  Terrilee Croak, MD  predniSONE (DELTASONE) 20 MG tablet 2 tabs po daily x 4 days 10/28/20   Deno Etienne, DO    No Known Allergies  Patient Active Problem List   Diagnosis Date Noted   Essential hypertension 09/21/2020   Other dysphagia    Hyperglycemia 09/13/2020   Uncontrolled type 2 diabetes mellitus with hyperglycemia ([place]) 09/13/2020   CAD (coronary artery disease) 01/23/2020   Hyperlipidemia 11/02/2019   History of MI (myocardial infarction) 11/02/2019   Cigarette smoker 08/15/2018   COPD ? GOLD III/ active smoker 08/14/2018   History of diet-controlled diabetes 06/30/2018   COPD (chronic obstructive pulmonary disease) ([place]) 10/14/2014   Type 2 diabetes mellitus with hyperglycemia, without long-term current use of insulin ([place]) 10/14/2014    Past Medical History:  Diagnosis Date   Diabetes mellitus ([place]) 10/2014   pt denies being diabetic.    Emphysema of lung ([place])    Emphysema/COPD ([place]) 10/2014   Hepatic steatosis    Thrombocytopenia ([place]) 09/2015   platelets in 120s.     Past Surgical History:  Procedure Laterality Date   BIOPSY  09/16/2020   Procedure: BIOPSY;  Surgeon: Thornton Park, MD;  Location: [facility];  Service: Gastroenterology;;   ESOPHAGOGASTRODUODENOSCOPY N/A 09/23/2015   Procedure: ESOPHAGOGASTRODUODENOSCOPY (EGD);  Surgeon: Ladene Artist, MD;  Location: [facility];  Service: Endoscopy;  Laterality: N/A;   ESOPHAGOGASTRODUODENOSCOPY (EGD) WITH PROPOFOL N/A 09/16/2020   Procedure: ESOPHAGOGASTRODUODENOSCOPY (EGD) WITH PROPOFOL;  Surgeon: Thornton Park, MD;  Location: [facility];  Service: Gastroenterology;  Laterality: N/A;   FOOT SURGERY     INGUINAL HERNIA REPAIR Left 05/15/2018   Procedure: LEFT  INGUINAL HERNIA REPAIR WITH MESH;  Surgeon: Erroll Luna, MD;  Location: [place];  Service: General;  Laterality: Left;   INSERTION OF MESH Left 05/15/2018   Procedure: INSERTION OF MESH;  Surgeon: Erroll Luna, MD;  Location: [place];  Service: General;  Laterality: Left;   UPPER GASTROINTESTINAL ENDOSCOPY      Social History   Socioeconomic History   Marital status: Married    Spouse name: Zoraida   Number of children: 6   Years of education: Not on file   Highest education level: Not on file  Occupational History   Occupation: Employed    Employer: [employer]  Tobacco Use   Smoking status: Every Day    Packs/day: 0.15    Years: 47.00    Pack years: 7.05    Types: Cigarettes   Smokeless tobacco: Never  Vaping Use   Vaping Use: Former  Substance and Sexual Activity   Alcohol use: Yes    Comment: occ   Drug use: No   Sexual activity: Never  Other Topics Concern   Not on file  Social History Narrative   Lives  with wife.  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   Social Determinants of Health   Financial Resource Strain: Not on file  Food Insecurity: Not on file  Transportation Needs: Not on file  Physical Activity: Not on file  Stress: Not on file  Social Connections: Not on file  Intimate Partner Violence: Not on file    Family History  Problem Relation Age of Onset   Emphysema Mother    Diabetes Mother    Emphysema Father    Cancer Sister        Stomach cancer   Diabetes Sister    Stomach cancer Sister    Diabetes Brother    Colon cancer Neg Hx    Colon polyps Neg Hx    Esophageal cancer Neg Hx    Rectal cancer Neg Hx      Review of Systems  Constitutional:  Negative for chills and fever.  HENT:  Negative for congestion and sore throat.   Respiratory:  Positive for shortness of breath and wheezing. Negative for cough.   Cardiovascular:  Negative for chest pain and palpitations.  Gastrointestinal:  Negative for abdominal pain,  diarrhea, nausea and vomiting.  Genitourinary: Negative.  Negative for dysuria and hematuria.  Skin:  Negative for rash.  Neurological:  Negative for dizziness and headaches.   Today's Vitals   12/22/20 0956  BP: 120/68  Pulse: (!) 106  Temp: 98.4 F (36.9 C)  TempSrc: Oral  SpO2: 96%  Weight: 167 lb 6.4 oz (75.9 kg)  Height: $Remove'5\' 8"'OfnPgbc$  (1.727 m)   Body mass index is 25.45 kg/m. Wt Readings from Last 3 Encounters:  12/22/20 167 lb 6.4 oz (75.9 kg)  10/22/20 167 lb (75.8 kg)  10/20/20 167 lb 8.8 oz (76 kg)    Physical Exam Vitals reviewed.  Constitutional:      Appearance: Normal appearance.  HENT:     Head: Normocephalic.  Eyes:     Extraocular Movements: Extraocular movements intact.     Pupils: Pupils are equal, round, and reactive to light.  Cardiovascular:     Rate and Rhythm: Normal rate and regular rhythm.     Pulses: Normal pulses.     Heart sounds: Normal heart sounds.  Pulmonary:     Effort: Pulmonary effort is normal.     Breath sounds: Wheezing present.  Abdominal:     Palpations: Abdomen is soft.     Tenderness: There is no abdominal tenderness.  Musculoskeletal:        General: Normal range of motion.     Cervical back: Normal range of motion and neck supple.  Skin:    General: Skin is warm and dry.     Capillary Refill: Capillary refill takes less than 2 seconds.  Neurological:     General: No focal deficit present.     Mental Status: He is alert and oriented to person, place, and time.  Psychiatric:        Mood and Affect: Mood normal.        Behavior: Behavior normal.    Results for orders placed or performed in visit on 12/22/20 (from the past 24 hour(s))  POCT glycosylated hemoglobin (Hb A1C)     Status: Abnormal   Collection Time: 12/22/20 10:14 AM  Result Value Ref Range   Hemoglobin A1C 7.5 (A) 4.0 - 5.6 %   HbA1c POC (<> result, manual entry)     HbA1c, POC (prediabetic range)     HbA1c, POC (controlled  diabetic range)       ASSESSMENT & PLAN: A total of 45 minutes was spent with the patient and counseling/coordination of care regarding diabetes, hypertension, and COPD and cardiovascular risks associated with these conditions, review of all medications and changes made, COPD exacerbation treatment with corticosteroids and possible transient hyperglycemia and need to possibly increase Premeal insulin dosing, education on nutrition, review of most recent office visit notes, review of most recent emergency department visit, review of most recent blood work results including today's hemoglobin A1c, prognosis, documentation and need for follow-up in 3 months.  Essential hypertension Well-controlled hypertension.  Continue amlodipine 5 mg daily.  COPD (chronic obstructive pulmonary disease) ([place]) Having mild exacerbation at present time.  Continue Symbicort twice a day. Will start corticosteroids today.  Depo-Medrol shot in the office and Medrol Dosepak for the next several days.  No signs of infection.  Afebrile.  Uncontrolled type 2 diabetes mellitus with hyperglycemia ([place]) Much improved diabetes with hemoglobin A1c of 7.5. Continue glipizide 10 mg twice a day and continue present insulin regimen with 20 units of Lantus insulin in the morning and Premeal lispro insulin as per sliding scale. Diet and nutrition discussed. Follow-up in 3 months. Deonta was seen today for diabetes and chest pain.  Diagnoses and all orders for this visit:  Hypertension associated with diabetes ([place]) -     POCT glycosylated hemoglobin (Hb A1C) -     AMB Referral to [facility] Coordinaton  Atherosclerosis of aorta (HCC)  Essential hypertension -     amLODipine (NORVASC) 5 MG tablet; Take 1 tablet (5 mg total) by mouth daily.  Panlobular emphysema (HCC) -     methylPREDNISolone acetate (DEPO-MEDROL) injection 80 mg -     albuterol (VENTOLIN HFA) 108 (90 Base) MCG/ACT inhaler; Inhale 2 puffs into the lungs every 6 (six) hours  as needed for wheezing or shortness of breath. -     For home use only DME Nebulizer machine -     AMB Referral to [facility]  COPD exacerbation ([place]) -     methylPREDNISolone (MEDROL DOSEPAK) 4 MG TBPK tablet; Sig as indicated  Uncontrolled type 2 diabetes mellitus with hyperglycemia (HCC) -     AMB Referral to [place]  Other orders -     Cancel: EKG 12-Lead  Patient Instructions  Diabetes mellitus y nutricin, en adultos Diabetes Mellitus and Nutrition, Adult Si sufre de diabetes, o diabetes mellitus, es muy importante tener hbitos alimenticios saludables debido a que sus niveles de Designer, television/film set sangre (glucosa) se ven afectados en gran medida por lo que come y bebe. Comer alimentos saludables en las cantidades correctas, aproximadamente a la misma hora todos los [place], [place] ayudar a: Aeronautical engineer glucemia. Disminuir el riesgo de sufrir una enfermedad cardaca. Mejorar la presin arterial. Science writer o mantener un peso saludable. Qu puede afectar mi plan de alimentacin? Todas las personas que sufren de diabetes son diferentes y cada una tiene necesidades diferentes en cuanto a un plan de alimentacin. El mdico puede recomendarle que trabaje con un nutricionista para elaborar el mejor plan para usted. Su plan de alimentacin puede variar segn factores como: Las caloras que necesita. Los medicamentos que toma. Su peso. Sus niveles de glucemia, presin arterial y colesterol. Su nivel de [place]. Otras afecciones que tenga, como enfermedades cardacas o renales. Cmo me afectan los carbohidratos? Los carbohidratos, o hidratos de carbono, afectan su nivel de glucemia ms que cualquier otro tipo de alimento. La ingesta de  carbohidratos naturalmente aumenta la cantidad de [employer]. El recuento de carbohidratos es un mtodo destinado a Catering manager un registro de la cantidad de carbohidratos que se consumen. El recuento de carbohidratos es  importante para Theatre manager la glucemia a un nivel saludable, especialmente si utiliza insulina o toma determinadosmedicamentos por va oral para la diabetes. Es importante conocer la cantidad de carbohidratos que se pueden ingerir en cada comida sin correr Engineer, manufacturing. Esto es Psychologist, forensic. Su nutricionista puede ayudarlo a calcular la cantidad de carbohidratos que debeingerir en cada comida y en cada refrigerio. Cmo me afecta el alcohol? El alcohol puede provocar disminuciones sbitas de la glucemia (hipoglucemia), especialmente si utiliza insulina o toma determinados medicamentos por va oral para la diabetes. La hipoglucemia es una afeccin potencialmente mortal. Los sntomas de la hipoglucemia, como somnolencia, mareos y confusin, son similares a los sntomas de haber consumido demasiado alcohol. No beba alcohol si: Su mdico le indica no hacerlo. Est embarazada, puede estar embarazada o est tratando de [place]. Si bebe alcohol: No beba con el estmago vaco. Limite la cantidad que bebe: De 0 a 1 medida por da para las mujeres. De 0 a 2 medidas por da para los hombres. Est atento a la cantidad de alcohol que hay en las bebidas que toma. En los [place], una medida equivale a una botella de cerveza de 12 oz (355 ml), un vaso de vino de 5 oz (148 ml) o un vaso de una bebida alcohlica de alta graduacin de 1 oz (44 ml). Mantngase hidratado bebiendo agua, refrescos dietticos o t helado sin azcar. Tenga en cuenta que los refrescos comunes, los jugos y otras bebida para Optician, dispensing pueden contener mucha azcar y se deben contar como carbohidratos. Consejos para seguir Company secretary las etiquetas de los alimentos Comience por leer el tamao de la porcin en la "Informacin nutricional" en las etiquetas de los alimentos envasados y las bebidas. La cantidad de caloras, carbohidratos, grasas y otros nutrientes mencionados en la etiqueta se basan en una porcin  del alimento. Muchos alimentos contienen ms de una porcin por envase. Verifique la cantidad total de gramos (g) de carbohidratos totales en una porcin. Puede calcular la cantidad de porciones de carbohidratos al dividir el total de carbohidratos por 15. Por ejemplo, si un alimento tiene un total de 30 g de carbohidratos totales por porcin, equivale a 2 porciones de carbohidratos. Verifique la cantidad de gramos (g) de grasas saturadas y grasas trans de una porcin. Escoja alimentos que no contengan estas grasas o que su contenido de estas sea [place]. Verifique la cantidad de miligramos (mg) de sal (sodio) en una porcin. La [employer] de las personas deben limitar la ingesta de sodio total a menos de 2300 mg Honeywell. Siempre consulte la informacin nutricional de los alimentos etiquetados como "con bajo contenido de grasa" o "sin grasa". Estos alimentos pueden tener un mayor contenido de Location manager agregada o carbohidratos refinados, y deben evitarse. Hable con su nutricionista para identificar sus objetivos diarios en cuanto a los nutrientes mencionados en la etiqueta. Al ir de compras Evite comprar alimentos procesados, enlatados o precocidos. Estos alimentos tienden a Special educational needs teacher mayor cantidad de [place], sodio y azcar agregada. Compre en la zona exterior de la tienda de comestibles. Esta es la zona donde se encuentran con mayor frecuencia las frutas y las verduras frescas, los cereales a granel, las carnes frescas y los productos lcteos frescos. Al cocinar Utilice mtodos de coccin a  baja temperatura, como hornear, en lugar de mtodos de coccin a alta temperatura, como frer en abundante aceite. Cocine con aceites saludables, como el aceite de [place], canola o [place]. Evite cocinar con manteca, crema o carnes con alto contenido de grasa. Planificacin de las comidas Coma las comidas y los refrigerios regularmente, preferentemente a la misma hora todos [place]. Evite pasar largos perodos de tiempo sin  comer. Consuma alimentos ricos en fibra, como frutas frescas, verduras, frijoles y cereales integrales. Consulte a su nutricionista sobre cuntas porciones de carbohidratos puede consumir en cada comida. Consuma entre 4 y 6 onzas (entre 112 y 168 g) de protenas magras por da, como carnes [place], pollo, pescado, huevos o tofu. Una onza (oz) de protena magra equivale a: 1 onza (28 g) de carne, pollo o pescado. 1 huevo.  de taza (62 g) de tofu. Coma algunos alimentos por da que contengan grasas saludables, como aguacates, frutos secos, semillas y pescado. Qu alimentos debo comer? Lambert Mody Bayas. Manzanas. Naranjas. Duraznos. Damascos. Ciruelas. Uvas. Mango. Papaya.Edgar. Kiwi. Cerezas. [place] Valeda Malm. Espinaca. Verduras de Boeing, que incluyen col rizada, Alligator, hojas de [place] y de Deputy. Remolachas. Coliflor. Repollo. Brcoli. Zanahorias. Judas verdes. Tomates. Pimientos. Cebollas. Pepinos. Coles deBruselas. Granos Granos integrales, como panes, galletas, tortillas, cereales y pastas desalvado o integrales. Avena sin azcar. Quinua. Arroz integral o salvaje. Carnes y Psychiatric nurse. Carne de ave sin piel. Cortes magros de ave y carne de res. Tofu.Frutos secos. Semillas. Lcteos Productos lcteos sin grasa o con bajo contenido de [place], [place], yogur [place]. Es posible que los productos que se enumeran ms [place] no constituyan una lista completa de los alimentos y las bebidas que puede tomar. Consulte a un nutricionista para obtener ms informacin. Qu alimentos debo evitar? Lambert Mody Frutas enlatadas al almbar. Verduras Verduras enlatadas. Verduras congeladas con mantequilla o salsa de crema. Granos Productos elaborados con [place] y [place], como panes, pastas,bocadillos y cereales. Evite todos los alimentos procesados. Carnes y otras protenas Cortes de carne con alto contenido de Lobbyist. Carne de ave con piel. Carnesempanizadas o fritas. Carne  procesada. Evite las grasas saturadas. Lcteos Yogur, [place] enteros. Bebidas Bebidas azucaradas, como gaseosas o t helado. Es posible que los productos que se enumeran ms [place] no constituyan una lista completa de los alimentos y las bebidas que Nurse, adult. Consulte a un nutricionista para obtener ms informacin. Preguntas para hacerle al mdico Es necesario que me rena con Radio broadcast assistant en el cuidado de la diabetes? Es necesario que me rena con un nutricionista? A qu nmero puedo llamar si tengo preguntas? Cules son los mejores momentos para controlar la glucemia? Dnde encontrar ms informacin: Asociacin Estadounidense de la Diabetes (American Diabetes Association): diabetes.org Academy of Nutrition and Dietetics (Academia de Nutricin y Information systems manager): www.eatright.Unisys Corporation of Diabetes and Digestive and Kidney Diseases ([place] la Diabetes y [place] y [place]): DesMoinesFuneral.dk Association of Diabetes Care and Education Specialists (Asociacin de Especialistas en Atencin y Educacin sobre la Diabetes): www.diabeteseducator.org Resumen Es importante tener hbitos alimenticios saludables debido a que sus niveles de Designer, television/film set sangre (glucosa) se ven afectados en gran medida por lo que come y bebe. Un plan de alimentacin saludable lo ayudar a controlar la glucemia y Theatre manager un estilo de vida saludable. El mdico puede recomendarle que trabaje con un nutricionista para elaborar el mejor plan para usted. Tenga en cuenta que los carbohidratos (hidratos de carbono) y el alcohol tienen efectos inmediatos en sus niveles  de glucemia. Es importante contar los carbohidratos que ingiere y consumir alcohol con prudencia. Esta informacin no tiene Marine scientist el consejo del mdico. Asegresede hacerle al mdico cualquier pregunta que tenga. Document Revised: 07/25/2019 Document Reviewed: 07/25/2019 Elsevier Patient Education   2021 Earling, MD [facility]

## 2020-12-22 NOTE — Patient Instructions (Signed)
Diabetes mellitus y nutricin, en adultos Diabetes Mellitus and Nutrition, Adult Si sufre de diabetes, o diabetes mellitus, es muy importante tener hbitos alimenticios saludables debido a que sus niveles de Designer, television/film set sangre (glucosa) se ven afectados en gran medida por lo que come y bebe. Comer alimentos saludables en las cantidades correctas, aproximadamente a la misma hora todos los [place], [place] ayudar a: Aeronautical engineer glucemia. Disminuir el riesgo de sufrir una enfermedad cardaca. Mejorar la presin arterial. Science writer o mantener un peso saludable. Qu puede afectar mi plan de alimentacin? Todas las personas que sufren de diabetes son diferentes y cada una tiene necesidades diferentes en cuanto a un plan de alimentacin. El mdico puede recomendarle que trabaje con un nutricionista para elaborar el mejor plan para usted. Su plan de alimentacin puede variar segn factores como: Las caloras que necesita. Los medicamentos que toma. Su peso. Sus niveles de glucemia, presin arterial y colesterol. Su nivel de [place]. Otras afecciones que tenga, como enfermedades cardacas o renales. Cmo me afectan los carbohidratos? Los carbohidratos, o hidratos de carbono, afectan su nivel de glucemia ms que cualquier otro tipo de alimento. La ingesta de carbohidratos naturalmente aumenta la cantidad de [employer]. El recuento de carbohidratos es un mtodo destinado a Catering manager un registro de la cantidad de carbohidratos que se consumen. El recuento de carbohidratos es importante para Theatre manager la glucemia a un nivel saludable, especialmente si utiliza insulina o toma determinadosmedicamentos por va oral para la diabetes. Es importante conocer la cantidad de carbohidratos que se pueden ingerir en cada comida sin correr Engineer, manufacturing. Esto es Psychologist, forensic. Su nutricionista puede ayudarlo a calcular la cantidad de carbohidratos que debeingerir en cada comida y en cada refrigerio. Cmo me  afecta el alcohol? El alcohol puede provocar disminuciones sbitas de la glucemia (hipoglucemia), especialmente si utiliza insulina o toma determinados medicamentos por va oral para la diabetes. La hipoglucemia es una afeccin potencialmente mortal. Los sntomas de la hipoglucemia, como somnolencia, mareos y confusin, son similares a los sntomas de haber consumido demasiado alcohol. No beba alcohol si: Su mdico le indica no hacerlo. Est embarazada, puede estar embarazada o est tratando de [place]. Si bebe alcohol: No beba con el estmago vaco. Limite la cantidad que bebe: De 0 a 1 medida por da para las mujeres. De 0 a 2 medidas por da para los hombres. Est atento a la cantidad de alcohol que hay en las bebidas que toma. En los [place], una medida equivale a una botella de cerveza de 12 oz (355 ml), un vaso de vino de 5 oz (148 ml) o un vaso de una bebida alcohlica de alta graduacin de 1 oz (44 ml). Mantngase hidratado bebiendo agua, refrescos dietticos o t helado sin azcar. Tenga en cuenta que los refrescos comunes, los jugos y otras bebida para Optician, dispensing pueden contener mucha azcar y se deben contar como carbohidratos. Consejos para seguir Company secretary las etiquetas de los alimentos Comience por leer el tamao de la porcin en la "Informacin nutricional" en las etiquetas de los alimentos envasados y las bebidas. La cantidad de caloras, carbohidratos, grasas y otros nutrientes mencionados en la etiqueta se basan en una porcin del alimento. Muchos alimentos contienen ms de una porcin por envase. Verifique la cantidad total de gramos (g) de carbohidratos totales en una porcin. Puede calcular la cantidad de porciones de carbohidratos al dividir el total de carbohidratos por 15. Por ejemplo, si un alimento tiene un total  de 30 g de carbohidratos totales por porcin, equivale a 2 porciones de carbohidratos. Verifique la cantidad de gramos (g) de grasas  saturadas y grasas trans de una porcin. Escoja alimentos que no contengan estas grasas o que su contenido de estas sea [place]. Verifique la cantidad de miligramos (mg) de sal (sodio) en una porcin. La [employer] de las personas deben limitar la ingesta de sodio total a menos de 2300 mg Honeywell. Siempre consulte la informacin nutricional de los alimentos etiquetados como "con bajo contenido de grasa" o "sin grasa". Estos alimentos pueden tener un mayor contenido de Location manager agregada o carbohidratos refinados, y deben evitarse. Hable con su nutricionista para identificar sus objetivos diarios en cuanto a los nutrientes mencionados en la etiqueta. Al ir de compras Evite comprar alimentos procesados, enlatados o precocidos. Estos alimentos tienden a Special educational needs teacher mayor cantidad de [place], sodio y azcar agregada. Compre en la zona exterior de la tienda de comestibles. Esta es la zona donde se encuentran con mayor frecuencia las frutas y las verduras frescas, los cereales a granel, las carnes frescas y los productos lcteos frescos. Al cocinar Utilice mtodos de coccin a baja temperatura, como hornear, en lugar de mtodos de coccin a alta temperatura, como frer en abundante aceite. Cocine con aceites saludables, como el aceite de [place], canola o [place]. Evite cocinar con manteca, crema o carnes con alto contenido de grasa. Planificacin de las comidas Coma las comidas y los refrigerios regularmente, preferentemente a la misma hora todos [place]. Evite pasar largos perodos de tiempo sin comer. Consuma alimentos ricos en fibra, como frutas frescas, verduras, frijoles y cereales integrales. Consulte a su nutricionista sobre cuntas porciones de carbohidratos puede consumir en cada comida. Consuma entre 4 y 6 onzas (entre 112 y 168 g) de protenas magras por da, como carnes [place], pollo, pescado, huevos o tofu. Una onza (oz) de protena magra equivale a: 1 onza (28 g) de carne, pollo o pescado. 1 huevo.  de  taza (62 g) de tofu. Coma algunos alimentos por da que contengan grasas saludables, como aguacates, frutos secos, semillas y pescado. Qu alimentos debo comer? Lambert Mody Bayas. Manzanas. Naranjas. Duraznos. Damascos. Ciruelas. Uvas. Mango. Papaya.Summit. Kiwi. Cerezas. [place] Valeda Malm. Espinaca. Verduras de Boeing, que incluyen col rizada, [place], hojas de [place] y de [place]. Remolachas. Coliflor. Repollo. Brcoli. Zanahorias. Judas verdes. Tomates. Pimientos. Cebollas. Pepinos. Coles deBruselas. Granos Granos integrales, como panes, galletas, tortillas, cereales y pastas desalvado o integrales. Avena sin azcar. Quinua. Arroz integral o salvaje. Carnes y Psychiatric nurse. Carne de ave sin piel. Cortes magros de ave y carne de res. Tofu.Frutos secos. Semillas. Lcteos Productos lcteos sin grasa o con bajo contenido de [place], [place], yogur [place]. Es posible que los productos que se enumeran ms [place] no constituyan una lista completa de los alimentos y las bebidas que puede tomar. Consulte a un nutricionista para obtener ms informacin. Qu alimentos debo evitar? Lambert Mody Frutas enlatadas al almbar. Verduras Verduras enlatadas. Verduras congeladas con mantequilla o salsa de crema. Granos Productos elaborados con [place] y [place], como panes, pastas,bocadillos y cereales. Evite todos los alimentos procesados. Carnes y otras protenas Cortes de carne con alto contenido de Lobbyist. Carne de ave con piel. Carnesempanizadas o fritas. Carne procesada. Evite las grasas saturadas. Lcteos Yogur, [place] enteros. Bebidas Bebidas azucaradas, como gaseosas o t helado. Es posible que los productos que se enumeran ms [place] no constituyan una lista completa de los alimentos y las bebidas que Nurse, adult.  Consulte a un nutricionista para obtener ms informacin. Preguntas para hacerle al mdico Es necesario que me rena con Radio broadcast assistant en el cuidado de la  diabetes? Es necesario que me rena con un nutricionista? A qu nmero puedo llamar si tengo preguntas? Cules son los mejores momentos para controlar la glucemia? Dnde encontrar ms informacin: Asociacin Estadounidense de la Diabetes (American Diabetes Association): diabetes.org Academy of Nutrition and Dietetics (Academia de Nutricin y Information systems manager): www.eatright.Unisys Corporation of Diabetes and Digestive and Kidney Diseases ([place] la Diabetes y [place] y [place]): DesMoinesFuneral.dk Association of Diabetes Care and Education Specialists (Asociacin de Especialistas en Atencin y Educacin sobre la Diabetes): www.diabeteseducator.org Resumen Es importante tener hbitos alimenticios saludables debido a que sus niveles de Designer, television/film set sangre (glucosa) se ven afectados en gran medida por lo que come y bebe. Un plan de alimentacin saludable lo ayudar a controlar la glucemia y Theatre manager un estilo de vida saludable. El mdico puede recomendarle que trabaje con un nutricionista para elaborar el mejor plan para usted. Tenga en cuenta que los carbohidratos (hidratos de carbono) y el alcohol tienen efectos inmediatos en sus niveles de glucemia. Es importante contar los carbohidratos que ingiere y consumir alcohol con prudencia. Esta informacin no tiene Marine scientist el consejo del mdico. Asegresede hacerle al mdico cualquier pregunta que tenga. Document Revised: 07/25/2019 Document Reviewed: 07/25/2019 Elsevier Patient Education  2021 [employer].

## 2020-12-30 ENCOUNTER — Telehealth: Payer: Self-pay

## 2020-12-30 NOTE — Chronic Care Management (AMB) (Signed)
  Chronic Care Management   Outreach Note  12/30/2020 Name: Duane Cuevas MRN: [id number] DOB: May 29, 1956  Duane Cuevas is a 65 y.o. year old male who is a primary care patient of Sagardia, Ines Bloomer, MD. I reached out to Duane Cuevas by phone today in response to a referral sent by Duane Cuevas's PCP, [name], MD      An unsuccessful telephone outreach was attempted today. The patient was referred to the case management team for assistance with care management and care coordination.   Follow Up Plan: A HIPAA compliant phone message was left for the patient providing contact information and requesting a return call.  The care management team will reach out to the patient again over the next 7 days.  If patient returns call to provider office, please advise to call Duane Cuevas at [place], [place], [place], [place] [address] Direct Dial: [phone] [email or web address] Website: [email or web address]

## 2021-01-06 ENCOUNTER — Telehealth: Payer: Self-pay | Admitting: Emergency Medicine

## 2021-01-06 NOTE — Telephone Encounter (Signed)
    Daughter calling to find out the status of nebulizer machine  Please call

## 2021-01-06 NOTE — Chronic Care Management (AMB) (Signed)
  Chronic Care Management   Note  01/06/2021 Name: Ashkan Chamberland MRN: [id number] DOB: [birth year]  Isidor Bromell is a 65 y.o. year old male who is a primary care patient of Mitchel Honour, Ines Bloomer, MD. I reached out to Hettie Holstein by phone today in response to a referral sent by Mr. Glenard Haring Valle[place]Perez's PCP, [name], MD      Mr. Penny Pia was given information about Chronic Care Management services today including:  CCM service includes personalized support from designated clinical staff supervised by his physician, including individualized plan of care and coordination with other care providers 24/7 contact phone numbers for assistance for urgent and routine care needs. Service will only be billed when office clinical staff spend 20 minutes or more in a month to coordinate care. Only one practitioner may furnish and bill the service in a calendar month. The patient may stop CCM services at any time (effective at the end of the month) by phone call to the office staff. The patient will be responsible for cost sharing (co-pay) of up to 20% of the service fee (after annual deductible is met).  Patient agreed to services and verbal consent obtained.   Follow up plan: Telephone appointment with care management team member scheduled for:01/14/2021  Noreene Larsson, [place], [place], [place] [address] Direct Dial: [phone] [email or web address] Website: [email or web address]

## 2021-01-06 NOTE — Telephone Encounter (Signed)
Sent a community message to Duane Cuevas, stating have the DME orders been received for a nebulizer machine for pt. Called and spoke with pt daughter to update them on the status.

## 2021-01-13 ENCOUNTER — Telehealth: Payer: Self-pay

## 2021-01-13 NOTE — Telephone Encounter (Signed)
PA started by [facility]: [id number] There are other alternatives, but need to determine if patient taking medication as it was prescribed on 11/05/20.  Last OV on 12/22/20, MD noted that pt was taking medication.  LVM (using interpreter) instructing pt to call back re: medication.

## 2021-01-14 ENCOUNTER — Ambulatory Visit (INDEPENDENT_AMBULATORY_CARE_PROVIDER_SITE_OTHER): Payer: Medicare HMO | Admitting: *Deleted

## 2021-01-14 DIAGNOSIS — J439 Emphysema, unspecified: Secondary | ICD-10-CM

## 2021-01-14 DIAGNOSIS — E1165 Type 2 diabetes mellitus with hyperglycemia: Secondary | ICD-10-CM | POA: Diagnosis not present

## 2021-01-14 NOTE — Telephone Encounter (Signed)
The following alternative to Lantus are covered by the patient's insurance.   Basaglar KwikPen Bydureon BCise  Insulin Glargine Insulin Glargine Solostar Insulin Glargine-yfgn Levemi Levemir FlexTouch  NovoLOG 70/30 FlexPen ReliOn  NovoLOG FlexPen  NovoLOG FlexPen ReliOn  NovoLOG Mix 70/30 FlexPen  Ozempic (0.25 or 0.5 MG/DOSE)  Willeen Niece  Tyler Aas  Tresiba FlexTouch  Trulicity  Victoza  Claris Che

## 2021-01-14 NOTE — Chronic Care Management (AMB) (Signed)
Chronic Care Management   CCM RN Visit Note  01/14/2021 Name: Duane Cuevas MRN: [id number] DOB: 1956-01-12  Subjective: Duane Cuevas is a 65 y.o. year old male who is a primary care patient of Sagardia, Ines Bloomer, MD. The care management team was consulted for assistance with disease management and care coordination needs.    Engaged with patient by telephone for initial visit in response to provider referral for case management and/or care coordination services. Contacted [employer], call facilitated by Spanish interpreter "Fabio Bering" ID [phone]  Consent to Services:  The patient was given information about Chronic Care Management services, agreed to services, and gave verbal consent 01/06/21 prior to initiation of services.  Please see initial visit note for detailed documentation.  Patient agreed to services and verbal consent obtained.   Assessment: Review of patient past medical history, allergies, medications, health status, including review of consultants reports, laboratory and other test data, was performed as part of comprehensive evaluation and provision of chronic care management services.   SDOH (Social Determinants of Health) assessments and interventions performed:  SDOH Interventions    Flowsheet Row Most Recent Value  SDOH Interventions   Food Insecurity Interventions Other (Comment)  [Community Resource Care Guide Referral placed today]  Financial Strain Interventions Other (Comment)  [Community Resource Care Guide referral plan]  Housing Interventions Other (Comment)  [CCM Community  Guide referral placed]  Transportation Interventions Intervention Not Indicated  [Daughter provides transportation]      CCM Care Plan  Not on File  Outpatient Encounter Medications as of 01/14/2021  Medication Sig Note   albuterol (PROVENTIL) (2.5 MG/3ML) 0.083% nebulizer solution Take 3 mLs (2.5 mg total) by nebulization every 6 (six) hours as needed for wheezing or  shortness of breath.    albuterol (VENTOLIN HFA) 108 (90 Base) MCG/ACT inhaler Inhale 2 puffs into the lungs every 6 (six) hours as needed for wheezing or shortness of breath.    amLODipine (NORVASC) 5 MG tablet Take 1 tablet (5 mg total) by mouth daily.    atorvastatin (LIPITOR) 20 MG tablet TAKE ONE TABLET BY MOUTH ONE TIME DAILY    benzonatate (TESSALON) 100 MG capsule Take 1 capsule (100 mg total) by mouth every 8 (eight) hours.    blood glucose meter kit and supplies KIT Dispense based on patient and insurance preference. Use up to four times daily as directed. 12/22/2020: use   blood glucose meter kit and supplies Dispense based on patient and insurance preference. Use up to four times daily as directed. (FOR ICD-10 E10.9, E11.9). 12/22/2020: use   budesonide-formoterol (SYMBICORT) 160-4.5 MCG/ACT inhaler Inhale 2 puffs into the lungs 2 (two) times daily.    glipiZIDE (GLUCOTROL) 10 MG tablet Take 1 tablet (10 mg total) by mouth 2 (two) times daily before a meal. (Patient taking differently: Take 10 mg by mouth daily before breakfast. Takes 1 more tablet if blood sugar is high)    glipiZIDE (GLUCOTROL) 5 MG tablet TAKE ONE TABLET BY MOUTH ONE TIME DAILY WITH BREAKFAST (Patient not taking: Reported on 12/22/2020)    glucose blood (ACCU-CHEK GUIDE) test strip Use as instructed 12/22/2020: use   guaiFENesin-dextromethorphan (ROBITUSSIN DM) 100-10 MG/5ML syrup Take 5 mLs by mouth every 4 (four) hours as needed for cough (chest congestion). (Patient not taking: Reported on 12/22/2020)    insulin glargine (LANTUS) 100 UNIT/ML Solostar Pen Inject 20 Units into the skin daily.    insulin lispro (HUMALOG) 100 UNIT/ML KwikPen Inject 3 Units into the skin 3 (three) times  daily. Adjust amount of insulin as per sliding scale. (Patient taking differently: Inject 3-15 Units into the skin 3 (three) times daily. Adjust amount of insulin as per sliding scale.) 12/22/2020: taking   insulin lispro (HUMALOG) 100 UNIT/ML  KwikPen INJECT 3 UNITS INTO THE SKIN 3 TIMES DAILY AS DIRECTED. (DISCARD PEN AFTER 28 DAYS)    Insulin Pen Needle 32G X 4 MM MISC USE TO INJECT LANTUS AND HUMALOG 4 TIMES DAILY 12/22/2020: use   metFORMIN (GLUCOPHAGE) 1000 MG tablet Take 1 tablet (1,000 mg total) by mouth 2 (two) times daily with a meal.    methylPREDNISolone (MEDROL DOSEPAK) 4 MG TBPK tablet Sig as indicated    ondansetron (ZOFRAN ODT) 4 MG disintegrating tablet 4mg  ODT q4 hours prn nausea/vomit    oseltamivir (TAMIFLU) 75 MG capsule Take 1 capsule (75 mg total) by mouth every 12 (twelve) hours. (Patient not taking: Reported on 12/22/2020)    pantoprazole (PROTONIX) 40 MG tablet TAKE 1 TABLET BY MOUTH 2 TIMES DAILY BEFORE MEALS (Patient taking differently: Take 40 mg by mouth 2 (two) times daily.)    Facility-Administered Encounter Medications as of 01/14/2021  Medication   morphine 2 MG/ML injection    Patient Active Problem List   Diagnosis Date Noted   Essential hypertension 09/21/2020   Other dysphagia    Hyperglycemia 09/13/2020   Uncontrolled type 2 diabetes mellitus with hyperglycemia ([place]) 09/13/2020   CAD (coronary artery disease) 01/23/2020   Hyperlipidemia 11/02/2019   History of MI (myocardial infarction) 11/02/2019   Cigarette smoker 08/15/2018   COPD ? GOLD III/ active smoker 08/14/2018   History of diet-controlled diabetes 06/30/2018   COPD (chronic obstructive pulmonary disease) ([place]) 10/14/2014   Type 2 diabetes mellitus with hyperglycemia, without long-term current use of insulin ([place]) 10/14/2014    Conditions to be addressed/monitored:  COPD and DMII  Care Plan : COPD (Adult)  Updates made by Knox Royalty, RN since 01/14/2021 12:00 AM     Problem: Symptom Exacerbation (COPD)   Priority: Medium     Long-Range Goal: Symptom Exacerbation Prevented or Minimized   Start Date: 01/14/2021  Expected End Date: 01/14/2022  This Visit's Progress: On track  Priority: Medium  Note:   Current Barriers:   Knowledge Deficits related to basic COPD self care/management Continues to smoke "1-2" cigarettes per day" Difficulty obtaining or cannot afford medications: reports today he is unable to afford the insulin that PCP has prescribed: verified that PCP is aware and considering options Financial Constraints: reports difficulty affording food, paying for utilities: [place] referral placed Language barrier with limited health literacy: requires interpreter services and frequent repetition/ reinforcement Unable to independently verbalize appropriate diet in setting of DM Case Manager Clinical Goal(s):  Over the next 12 months, patient will demonstrate improved adherence to prescribed treatment plan for COPD self care/management as evidenced by patient reporting during CCM RN CM outreach of: Utilizing pursed lip breathing for shortness of breath Verbalize understanding of COPD action plan and when to seek appropriate levels of medical care Verbalize basic understanding of COPD disease process and self care activities  Interventions:  Collaboration with [name], MD regarding development and update of comprehensive plan of care as evidenced by provider attestation and co-signature Inter-disciplinary care team collaboration (see longitudinal plan of care) Chart reviewed including relevant office notes, upcoming scheduled appointments, and lab results Discussed current  clinical condition with patient and confirmed no current clinical concerns; confirmed patient manages medications independently; patient reports good adherence  to medication regimen and denies medication concerns, however, he is asking me when he will receive his nebulizer: confirmed that PCP team is currently working on this, will message PCP team to follow up with update to patient  Medication review: declined today, states not at his home; agreeable to completion at time of next CCM RN CM outreach Reviewed  recent hospitalization in April 2022 with patient: today, they deny current clinical concerns around patient's post-hospital discharge status; denies issues around breathing, states "having a good day" Reviewed recent PCP office visit with patient from 12/22/20: patient denies questions Basic overview and discussion of pathophysiology of COPD education initiated: patient reports he continues to smoke "1-2 cigarettes per day;" complete smoking cessation encouraged; provided patient with basic verbal COPD education on self care/management/and exacerbation prevention patient will require ongoing reinforcement Provided verbal education around signs/ symptoms yellow COPD zone and need to take action early; patient reports uses rescue inhaler when he feels short of breath, but is unable to tell me specifically how often he uses; additionally, patient tells me he uses home O2 at night, but is unable to tell me how many liters/ minute: he states that he uses for a "couple of hours" and then takes it off; reports that he only has a oxygen tank at home (no concentrator) that he "takes to the medical supply store" to get refilled Provided patient with COPD action plan and reinforced importance of daily self assessment, need to act promptly and early if he feels he is having COPD flare-up Advanced Directive planning discussed; patient request printed information- will mail to patient Reviewed upcoming provider appointments with patient/ caregiver and confirmed that patient has plans to attend all as scheduled: Follow up PCP provider office visit scheduled 03/24/21 Self-Care Activities:  Self administers medications as prescribed Attends all scheduled provider appointments Calls pharmacy for medication refills Performs ADL's and iADL's independently Calls provider office for new concerns or questions Patient Goals: Develop a rescue plan for episodes of shortness of breath, difficulty breathing and follow rescue plan  if symptoms flare-up Think about the things that cause your episodes of shortness of breath and eliminate symptom triggers at home Please try to completely quit smoking: this is the most important thing you can do to help your breathing improve Attend all doctor appointments  Take all medications as prescribed Contact your doctor if you develop early signs of difficulty breathing- don't wait until the last minute to take action Follow Up Plan:  Telephone follow up appointment with care management team member scheduled for:  Tuesday February 02, 2021 at 10:15 am The patient has been provided with contact information for the care management team and has been advised to call with any health related questions or concerns.      Care Plan : Diabetes Type 2 (Adult)  Updates made by Knox Royalty, RN since 01/14/2021 12:00 AM     Problem: Glycemic Management (Diabetes, Type 2)   Priority: Medium     Long-Range Goal: Glycemic Management Optimized   Start Date: 01/14/2021  Expected End Date: 01/14/2022  This Visit's Progress: On track  Priority: Medium  Note:   Objective:  Lab Results  Component Value Date   HGBA1C 7.5 (A) 12/22/2020   Lab Results  Component Value Date   CREATININE 1.10 10/28/2020   CREATININE 0.85 10/21/2020   CREATININE 0.89 10/20/2020   No results found for: EGFR Current Barriers:  Knowledge Deficits related to basic Diabetes pathophysiology and self care/management Difficulty obtaining  or cannot afford medications: reports today he is unable to afford the insulin that PCP has prescribed: verified that PCP is aware and considering options Financial Constraints: reports difficulty affording food, paying for utilities: [place] referral placed Language barrier with limited health literacy: requires interpreter services and frequent repetition/ reinforcement Unable to independently verbalize appropriate diet in setting of DM Case Manager Clinical  Goal(s):  Over the next 12 months, patient will demonstrate improved adherence to prescribed treatment plan for diabetes self care/management as evidenced by patient reporting during CCM RN CM outreach of: Daily monitoring and recording of blood sugars at home 3-4 times per day Improved adherence to ADA/ carb modified, low sugar diet  Adherence to prescribed medication regimen  Contacting provider for new or worsened symptoms or questions Interventions:  Collaboration with [name], MD regarding development and update of comprehensive plan of care as evidenced by provider attestation and co-signature Inter-disciplinary care team collaboration (see longitudinal plan of care) Review of patient status, including review of consultants reports, relevant laboratory and other test results, and medications completed Discussed current clinical condition with patient, confirmed no current clinical concerns Assessed patient's baseline knowledge around self-health management of DM: confirmed patient has limited health literacy; provided education to patient about basic DM disease process, risk of complications of high blood sugars over time, need for ongoing routine self-care activities Reviewed with patient recent blood sugars at home: reports he monitors blood sugars 3-4 times per day; reports consistently "high" blood sugars in ranges > 200 frequently; reports fasting blood sugar this morning of "280."  Does not record blood sugars on paper- encouraged to begin doing for future review together, encouraged him to continue monitoring/ recording blood sugars at home Discussed medications for DM: patient declines medication review today, states he is not near his medications; confirms he manages medications independently; reports he is unable to afford his current insulin, states PCP is currently "working" on this- states he is waiting to hear back prior to running out of the insulin he can not afford,  denies other medications concerns today, agreeable to full medication review at time of next CCM RN CM telephone appointment Confirmed patient does not follow DM diet: reports eating white foods, rice, bread, potatoes regularly; does not drink soda or sweetened drinks: will provide printed educational material; encouraged patient to review and begin incorporating small changes into his diet to improve and decrease blood sugars Reviewed scheduled/upcoming provider appointments including: PCP follow up visit 03/24/21 Discussed plans with patient for ongoing care management follow up and provided patient with direct contact information for care management team Self-Care Activities Self administers oral medications as prescribed Self administers insulin as prescribed Attends all scheduled provider appointments Checks blood sugars 3-4 times per day Patient Goals: Continue checking your blood sugars at home 4 times a day: always check it first thing in the morning before you eat and then again several times, about 2 hours after a meal Check blood sugars more often if you feel bad or if I feel it may be too high or too low Write blood sugar readings from home down on paper- we will review these with each of our phone call appointments Take the blood sugars you have written down on paper to all doctor visits  Continue taking your medications as prescribed Try to decrease the amount of high carbohydrate and high sugar foods you are eating, such as white bread and white rice Please read over the book "Living Well  with Diabetes" this is a great resource to learn about what foods are good to eat Follow Up Plan:  Telephone follow up appointment with care management team member scheduled for: Tuesday February 02, 2021 at 10:15 am The patient has been provided with contact information for the care management team and has been advised to call with any health related questions or concerns.      Plan: Telephone  follow up appointment with care management team member scheduled for:  Tuesday February 02, 2021 at 10:15 am The patient has been provided with contact information for the care management team and has been advised to call with any health related questions or concerns.   [name], RN, BSN, [facility] RN Care Coordination- [place] [phone]: direct office [phone]: mobile

## 2021-01-14 NOTE — Patient Instructions (Addendum)
Informacin de la visita  ngel, fue agradable hablar contigo hoy.  Lea la informacin adjunta y contine controlando su nivel de azcar en la sangre 3 o 4 veces al da; Chiropodist en un papel: los revisaremos durante cada una de nuestras citas telefnicas de seguimiento  Como discutimos hoy, le ped al equipo de la oficina del Dr. Loreen Freud hiciera un seguimiento con usted sobre la insulina que puede pagar y el nebulizador: parece que el equipo ya est trabajando en estos artculos.  Le he pedido a la Gua de atencin de recursos comunitarios que se comunique con usted acerca de los recursos financieros para los servicios pblicos y el costo de los alimentos. Escuche una llamada del equipo de gua de atencin.  Espero hablar con usted nuevamente para obtener una actualizacin el [place] 2 de [place] de 2022 a las 10:15 a.m. Est atento a mi Associate Professor. Llamar lo ms cerca posible de las 10:15 am; Espero escuchar acerca de su progreso.  No dude en comunicarse conmigo si puedo ayudarlo antes de nuestra prxima cita programada.   Visit [place], it was nice talking with you today.   Please read over the attached information, and continue checking your blood sugars 3-4 times each day; please write these values down on paper: we will review them during each of our follow up phone call appointments  As we discussed today, I have asked the team at Dr. Revonda Standard office to follow up with you about the insulin that you can afford and the nebulizer: it appears the team is already working on these items.  I have asked the [place] to contact you about financial resources for utilities and for the cost of food- please listen for a call from the care guide team    I look forward to talking to you again for an update on Tuesday February 02, 2021 at 10:15 am- please be listening out for my call that day.  I will call as close to 10:15 am as possible; I look  forward to hearing about your progress.   Please don't hesitate to contact me if I can be of assistance to you before our next scheduled appointment.   [name], RN, BSN, [facility] RN Care Coordination- [place] [phone]: direct office [phone]: mobile    METAS DEL PACIENTE: Janina Mayo abordados  Progreso de esta visita  Controle y administre mi nivel de azcar en la sangre: diabetes tipo 2 por buen camino Plazo: objetivo a Barrister's clerk Prioridad: Media Fecha de inicio: 14/7/22 Fecha de finalizacin prevista: 14/7/22  Toma Copier de seguimiento 08/11/20    Contine midiendo sus niveles de azcar en la sangre en casa 4 veces al da: siempre mida a primera hora de la maana antes de comer y Nurse, children's, aproximadamente 2 horas despus de una comida  Controle los niveles de azcar en la sangre con ms frecuencia si se siente mal o si siento que puede estar demasiado alto o demasiado bajo  [place] las lecturas de Location manager en la sangre de su casa en un papel; las revisaremos con cada una de nuestras citas telefnicas.  Lleve los niveles de azcar en la sangre que anot en un papel a todas las visitas al mdico.  Contine tomando sus medicamentos segn lo prescrito  Trate de disminuir la cantidad de alimentos ricos en carbohidratos y Location manager que come, como el pan blanco y el arroz blanco.  Por favor lea el libro "Living Well with Diabetes" este es un gran recurso para aprender sobre qu alimentos son buenos para comer   Porque es esto importante? ? Revisar su nivel de azcar en la sangre en casa ayuda a evitar que suba o baje demasiado. ? Yahoo en un diario o registro ayuda al mdico a saber cmo cuidarlo. ? Su registro de Dispensing optician debe tener la hora, la fecha y Fabens. ? Adems, anote la cantidad de insulina u otro medicamento que toma. ? Otra informacin, como lo que comi, el ejercicio realizado y cmo se  senta, tambin ser til.  Seguimiento y gestin de mis sntomas: EPOC Por buen camino Plazo: objetivo a Barrister's clerk Prioridad: Media Fecha de inicio: 14/7/22 Fecha de finalizacin prevista: 14/7/23  Toma Copier de seguimiento 08/11/20    Desarrolle un plan de rescate para episodios de falta de aire, dificultad para respirar y Water quality scientist de rescate si los sntomas reaparecen.  Piense en las cosas que causan sus episodios de dificultad para respirar y elimine los desencadenantes de los sntomas en el hogar  Intente dejar de fumar por completo: esto es lo ms importante que puede hacer para mejorar su respiracin.  Asistir a todas las citas [employer] segn lo prescrito  Comunquese con su mdico si presenta signos tempranos de dificultad para respirar; no espere hasta el ltimo minuto para tomar medidas.   Porque es esto importante? ? El seguimiento de sus sntomas y [place] informacin sobre su salud ayuda a su mdico a Academic librarian atencin. ? Anote los sntomas, la hora del da, lo que estaba haciendo y qu medicamento est tomando. ? Pronto aprender a manejar sus sntomas.  PATIENT GOALS:   Goals Addressed             This Visit's Progress    Monitor and Manage My Blood Sugar-Diabetes Type 2   On track    Timeframe:  Long-Range Goal Priority:  Medium Start Date:      01/14/21                       Expected End Date:     01/14/21                  Follow Up Date 02/02/21    Continue checking your blood sugars at home 4 times a day: always check it first thing in the morning before you eat and then again several times, about 2 hours after a meal Check blood sugars more often if you feel bad or if I feel it may be too high or too low Write blood sugar readings from home down on paper- we will review these with each of our phone call appointments Take the blood sugars you have written down on paper to all doctor visits  Continue taking your medications as  prescribed Try to decrease the amount of high carbohydrate and high sugar foods you are eating, such as white bread and white rice Please read over the book "Living Well with Diabetes" this is a great resource to learn about what foods are good to eat   Why is this important?   Checking your blood sugar at home helps to keep it from getting very high or very low.  Writing the results in a diary or log helps the doctor know how to care for you.  Your blood sugar log should have the  time, date and the results.  Also, write down the amount of insulin or other medicine that you take.  Other information, like what you ate, exercise done and how you were feeling, will also be helpful.          Track and Manage My Symptoms-COPD   On track    Timeframe:  Long-Range Goal Priority:  Medium Start Date:        01/14/21                     Expected End Date:     01/14/22                  Follow Up Date 02/02/21    Develop a rescue plan for episodes of shortness of breath, difficulty breathing and follow rescue plan if symptoms flare-up Think about the things that cause your episodes of shortness of breath and eliminate symptom triggers at home Please try to completely quit smoking: this is the most important thing you can do to help your breathing improve Attend all doctor appointments  Take all medications as prescribed Contact your doctor if you develop early signs of difficulty breathing- don't wait until the last minute to take action   Why is this important?   Tracking your symptoms and other information about your health helps your doctor plan your care.  Write down the symptoms, the time of day, what you were doing and what medicine you are taking.  You will soon learn how to manage your symptoms.            .Critical care medicine: Principles of diagnosis and management in the adult (4th ed., pp. 5993-5701). Saunders."> Miller's anesthesia (8th ed., pp. 232-250). Saunders.">   Voluntades anticipadas Advance Directive  Las voluntades anticipadas son documentos legales que le permiten tomar decisiones acerca de su tratamiento mdico y atencin de la salud en caso de no poder expresarse usted mismo. Las voluntades anticipadas comunican sus deseos afamiliares, amigos y mdicos. La elaboracin y la redaccin de las voluntades anticipadas deben realizarse con el transcurso del [place], en lugar de que suceda todo a la misma vez. Las voluntades anticipadas se pueden cambiar y Architectural technologist. Hay diferentes tipos de voluntades anticipadas, por ejemplo: Poder notarial mdico. Testamento vital. Orden de no reanimar (ONR) o de no intentar la reanimacin (ONIR). Apoderado para asuntos de salud y poder notarial mdico Al apoderado para asuntos de salud tambin se le llama representante para asuntos de Passenger transport manager. Esta persona es designada para tomar decisiones mdicas en representacin suya cuando usted no pueda tomar decisiones por usted mismo. Por lo general, las Engineer, agricultural piden a un amigo o familiar de confianza que acte como su apoderado y represente sus preferencias. Asegrese de tener un acuerdo con su persona de confianza para que acte como su apoderado. Es posible que un apoderado deba tomar una decisin mdica en su nombre si no se conocen susdeseos. Un poder notarial mdico, tambin llamado poder notarial duradero para asuntos de [place], es un documento legal que nombra a su apoderado para asuntos de Passenger transport manager. En funcin de las leyes de su estado, es posible que el documento tambin deba estar: [place]. Autenticado ante escribano. Fechado. Copiado. Atestiguado. Incorporado a la historia clnica del paciente. Tambin es posible que desee designar a una persona de confianza para administrar su dinero en caso de que usted no pueda hacerlo. Esto se denomina poder notarial duradero para asuntos financieros. Este es un  documento legal diferente del poder notarial  duradero para asuntos de salud. Usted puede elegir a su apoderado para asuntos de salud o a una persona diferente para que actecomo su apoderado en los asuntos de dinero. Si no asigna un apoderado, o si se considera que el apoderado no est actuando para su bien, un tribunal podr Designer, industrial/product legal para que acte enrepresentacin suya. El testamento vital El testamento vital es un conjunto de instrucciones en las que se establecen sus deseos acerca de la atencin mdica para el momento en que no pueda expresarlos usted mismo. Los mdicos deben conservar una copia del testamento vital en su historia clnica. Le recomendamos que entregue una copia a familiares o amigos. Con el fin de alertar a sus cuidadores en caso de Engineer, maintenance (IT), puede colocar una tarjeta en la billetera que indique que tiene un testamento vital y dnde Film/video editor. El testamento vital se [place] si: Tiene una enfermedad terminal. Queda discapacitado. No puede comunicarse ni tomar decisiones. Debe incluir las siguientes decisiones en su testamento vital: El uso o no uso de equipos de soporte vital, como dispositivos de dilisis y Systems developer respiratorias (respiradores). Si desea una ONR o [place]. Esto les indica a los mdicos que no usen Actor (RCP) si se le detiene la respiracin o los latidos cardacos. [place] o no uso de alimentacin por sonda. La administracin o no administracin de alimentos y lquidos. Si desea recibir atencin de alivio (cuidados paliativos) cuando el objetivo sea la comodidad ms que la [place]. Si desea donar sus rganos y tejidos. El testamento vital no da instrucciones acerca de la distribucin del dinero nilas propiedades en caso de fallecimiento. ONR u ONIR La ONR es el pedido de no Banker en el caso de que el corazn o la respiracin se Production designer, theatre/television/film. Si no se realiz ni se present [employer] u ONIR, el mdico intentar ayudar a cualquier paciente cuyo corazn o  respiracin se haya detenido. Si planea someterse a [place], hable con su mdico sobre elcumplimiento de su ONR u ONIR en caso de que surjan problemas. Qu sucede si no tengo una voluntad anticipada? Algunos estados asignan familiares a cargo de la toma de decisiones para que acten en representacin suya si no tiene una voluntad anticipada. Cada estado tiene sus propias leyes sobre las voluntades anticipadas. Es posible que desee consultar a su mdico, su abogado o al representante estatal acerca de lasleyes de su estado. Resumen Las voluntades anticipadas son documentos legales que le permiten tomar decisiones acerca de su tratamiento mdico y atencin de la salud en caso de no poder expresarse usted mismo. El proceso de Paediatric nurse y Building surveyor las voluntades anticipadas debe realizarse con el transcurso del [place]. Usted puede cambiar y Charity fundraiser las voluntades anticipadas en cualquier momento. Las voluntades anticipadas pueden incluir un poder mdico, un testamento vital y Ardelia Mems orden ONR u [place]. Esta informacin no tiene Marine scientist el consejo del mdico. Asegresede hacerle al mdico cualquier pregunta que tenga. Document Revised: 04/16/2020 Document Reviewed: 04/16/2020 Elsevier Patient Education  2022 [place] para los servicios de CCM: El Sr. [place] recibi informacin sobre los servicios de administracin de atencin crnica hoy, que incluyen: 1. El servicio de CCM incluye apoyo personalizado del personal clnico designado supervisado por su mdico, incluido un plan de atencin individualizado y coordinacin con otros proveedores de atencin 2. Nmeros de telfono de Diplomatic Services operational officer disponibles las 24 horas del da, los [place], para  asistencia con necesidades de atencin urgente y de [place]. 3. El servicio solo se facturar cuando el personal clnico del consultorio dedique 20 minutos o ms en un mes a coordinar la atencin. 4. Un solo mdico podr  prestar y Designer, television/film set en un mes natural. 5. El paciente puede suspender los servicios de CCM en cualquier momento (efectivo al final del mes) mediante una llamada telefnica al personal de la oficina. 6. El paciente ser responsable del costo compartido (copago) de hasta el 20 % de la tarifa del servicio (despus de alcanzar el deducible anual).  El paciente acept los servicios y Animal nutritionist consentimiento verbal.   El paciente verbaliz la comprensin de las instrucciones, los materiales educativos y Photographer plan de atencin proporcionados hoy y acept recibir por correo una copia de las instrucciones para el Portal, los materiales educativos y Theatre manager de atencin.  Cita telefnica de seguimiento con un miembro del equipo de administracin de la atencin programada para el [place] 2 de [place] de 2022 a las 10:15 a. m.  Se le ha proporcionado al paciente la informacin de contacto del equipo de administracin de la atencin y se le ha aconsejado que llame si tiene preguntas o inquietudes relacionadas con la salud.  Consent to CCM Services: Mr. Penny Pia was given information about Chronic Care Management services today including:  CCM service includes personalized support from designated clinical staff supervised by his physician, including individualized plan of care and coordination with other care providers 24/7 contact phone numbers for assistance for urgent and routine care needs. Service will only be billed when office clinical staff spend 20 minutes or more in a month to coordinate care. Only one practitioner may furnish and bill the service in a calendar month. The patient may stop CCM services at any time (effective at the end of the month) by phone call to the office staff. The patient will be responsible for cost sharing (co-pay) of up to 20% of the service fee (after annual deductible is met).  Patient agreed to services and verbal consent obtained.   The patient verbalized  understanding of instructions, educational materials, and care plan provided today and agreed to receive a mailed copy of patient instructions, educational materials, and care plan.  Telephone follow up appointment with care management team member scheduled for:  Tuesday February 02, 2021 at 10:15 am The patient has been provided with contact information for the care management team and has been advised to call with any health related questions or concerns.   [name], RN, BSN, [facility] RN Care Coordination- [place] [phone]: direct office [phone]: mobile   Plan de Atencin al Paciente: EPOC (Adulto)   Problema identificado: Exacerbacin de sntomas (EPOC) Prioridad: Media   Objetivo a largo plazo: Prevenir o minimizar la exacerbacin de los sntomas [place] de inicio: 14/01/2021 Fecha de finalizacin prevista: 14/01/2022 Progreso de esta visita: en camino Prioridad: Media Nota: Barreras Actuales:  Dficits de conocimiento relacionados con el autocuidado/manejo bsico de la EPOC  Contina fumando "1-2" cigarrillos por da"  Dificultad para Therapist, music o no poder pagar los medicamentos: informa que hoy no puede pagar la insulina que le recet el PCP: verific que el PCP est al tanto y considerando las opciones  Restricciones financieras: informes de dificultades para pagar alimentos, pagar servicios pblicos: referencia de la Gua de recursos comunitarios de MCP colocada  Barrera del idioma con alfabetizacin en salud limitada: requiere servicios de intrprete y repeticin/ refuerzo  frecuente  Incapaz de verbalizar de forma independiente una dieta adecuada en el contexto de la DM Meta(s) clnica(s) del Neurosurgeon de casos: Durante los prximos 12 meses, el paciente demostrar una mejor adherencia al plan de tratamiento prescrito para el autocuidado/manejo de la EPOC, como lo [employer] informe del paciente durante el alcance de CCM RN CM  de: ? Advertising copywriter respiracin con los labios fruncidos para la dificultad para respirar ? Verbalizar la comprensin del plan de accin de la EPOC y cundo buscar los niveles apropiados de atencin mdica ? Verbalizar la comprensin bsica del proceso de la enfermedad de la EPOC y las actividades de autocuidado Intervenciones:  Colaboracin con Sagardia, [place], MD en relacin con el desarrollo y la actualizacin del plan integral de atencin como lo demuestra la certificacin y Technical brewer conjunta del proveedor  Colaboracin del equipo de atencin interdisciplinario (Public affairs consultant de atencin longitudinal)  Grfico revisado que incluye notas de oficina relevantes, prximas citas programadas y [place] de laboratorio  Discuti la condicin clnica actual con el paciente y confirm que no tiene preocupaciones clnicas actuales; el paciente confirmado maneja los medicamentos de forma independiente; el paciente informa una buena adherencia al rgimen de medicamentos y niega preocupaciones sobre los medicamentos, sin embargo, me pregunta cundo recibir su nebulizador: confirm que el equipo de PCP est trabajando actualmente en esto, le enviar un mensaje al equipo de PCP para que haga un seguimiento con la actualizacin para el paciente  Revisin de medicamentos: rechazada hoy, afirma que no est en su casa; aceptable para completar en el momento de la prxima divulgacin del MCP RN CM  Revis la hospitalizacin reciente en abril de 2022 con el paciente: hoy, niegan las preocupaciones clnicas actuales sobre el estado posterior al alta hospitalaria del paciente; niega problemas relacionados con la respiracin, dice "tener un buen da"  Revis la visita reciente al [facility] del PCP con Payton Mccallum paciente del 21/6/22: el paciente niega las preguntas  Resumen bsico y discusin de la fisiopatologa de la educacin sobre la EPOC iniciada: el paciente informa que contina fumando "1-2 cigarrillos por da";  se anima a dejar de fumar por completo; proporcion al [employer] educacin verbal bsica sobre la EPOC sobre autocuidado/manejo/y prevencin de Publishing copy requerir un refuerzo continuo  Proporcion educacin verbal sobre los signos/sntomas de la zona Health visitor de la EPOC y la necesidad de Research scientist (life sciences) de Wellsite geologist temprana; el paciente informa que [place] el inhalador de rescate cuando siente que le falta el aire, pero no puede decirme especficamente con qu frecuencia lo [place]; adems, el paciente me dice que [place] O2 domiciliario por la noche, pero no sabe decirme cuntos litros/minuto: dice que [place] "un par de horas" y Engineer, mining se lo quita; informa que solo tiene un tanque de oxgeno en casa (sin Solicitor) que "lleva a la tienda de suministros mdicos" para que lo rellenen  Se le proporcion al [employer] un plan de accin para la EPOC y se reforz la importancia de la autoevaluacin diaria, la necesidad de Research scientist (life sciences) con prontitud y prontitud si siente que est teniendo un brote de EPOC  Se discuti la planificacin de directivas anticipadas; solicitud del paciente informacin impresa - se enviar por correo al paciente  Revis las prximas citas con el proveedor con Transport planner y confirm que el paciente tiene planes de asistir a todas segn lo programado: Visita de seguimiento al [facility] del proveedor PCP programada para el 21/9/22 Actividades de autocuidado:  Autoadministra los medicamentos segn lo prescrito  Asiste a todas las citas programadas con el proveedor  [place] a la farmacia para Production designer, theatre/television/film ADL e iADL de forma independiente  Llama al consultorio del proveedor si tiene nuevas inquietudes o preguntas Objetivos del paciente:  Desarrolle un plan de rescate para episodios de falta de aire, dificultad para respirar y siga el plan de rescate si los sntomas reaparecen.  Piense en las cosas que causan sus episodios de dificultad para respirar y elimine los  desencadenantes de los sntomas en el hogar  Intente dejar de fumar por completo: esto es lo ms importante que puede hacer para mejorar su respiracin.  Asistir a todas las citas [employer] segn lo prescrito  Comunquese con su mdico si presenta signos tempranos de dificultad para respirar; no espere hasta el ltimo minuto para tomar medidas. Plan de seguimiento:  Cita telefnica de seguimiento con un miembro del equipo de administracin de la atencin programada para el [place] 2 de [place] de 2022 a las 10:15 a. m.  Se le ha proporcionado al paciente la informacin de Diplomatic Services operational officer del equipo de administracin de la atencin y se le ha aconsejado que llame si tiene preguntas o inquietudes relacionadas con la salud.    Plan de Atencin al Paciente: Diabetes Tipo 2 (Adulto)   PR  CLINICAL CARE PLAN: Patient Care Plan: COPD (Adult)     Problem Identified: Symptom Exacerbation (COPD)   Priority: Medium     Long-Range Goal: Symptom Exacerbation Prevented or Minimized   Start Date: 01/14/2021  Expected End Date: 01/14/2022  This Visit's Progress: On track  Priority: Medium  Note:   Current Barriers:  Knowledge Deficits related to basic COPD self care/management Continues to smoke "1-2" cigarettes per day" Difficulty obtaining or cannot afford medications: reports today he is unable to afford the insulin that PCP has prescribed: verified that PCP is aware and considering options Financial Constraints: reports difficulty affording food, paying for utilities: [place] referral placed Language barrier with limited health literacy: requires interpreter services and frequent repetition/ reinforcement Unable to independently verbalize appropriate diet in setting of DM Case Manager Clinical Goal(s):  Over the next 12 months, patient will demonstrate improved adherence to prescribed treatment plan for COPD self care/management as evidenced by patient  reporting during CCM RN CM outreach of: Utilizing pursed lip breathing for shortness of breath Verbalize understanding of COPD action plan and when to seek appropriate levels of medical care Verbalize basic understanding of COPD disease process and self care activities  Interventions:  Collaboration with [name], MD regarding development and update of comprehensive plan of care as evidenced by provider attestation and co-signature Inter-disciplinary care team collaboration (see longitudinal plan of care) Chart reviewed including relevant office notes, upcoming scheduled appointments, and lab results Discussed current  clinical condition with patient and confirmed no current clinical concerns; confirmed patient manages medications independently; patient reports good adherence to medication regimen and denies medication concerns, however, he is asking me when he will receive his nebulizer: confirmed that PCP team is currently working on this, will message PCP team to follow up with update to patient  Medication review: declined today, states not at his home; agreeable to completion at time of next CCM RN CM outreach Reviewed recent hospitalization in April 2022 with patient: today, they deny current clinical concerns around patient's post-hospital discharge status; denies issues around breathing, states "having a good day" Reviewed recent PCP office visit with patient from 12/22/20: patient denies  questions Basic overview and discussion of pathophysiology of COPD education initiated: patient reports he continues to smoke "1-2 cigarettes per day;" complete smoking cessation encouraged; provided patient with basic verbal COPD education on self care/management/and exacerbation prevention patient will require ongoing reinforcement Provided verbal education around signs/ symptoms yellow COPD zone and need to take action early; patient reports uses rescue inhaler when he feels short of breath, but  is unable to tell me specifically how often he uses; additionally, patient tells me he uses home O2 at night, but is unable to tell me how many liters/ minute: he states that he uses for a "couple of hours" and then takes it off; reports that he only has a oxygen tank at home (no concentrator) that he "takes to the medical supply store" to get refilled Provided patient with COPD action plan and reinforced importance of daily self assessment, need to act promptly and early if he feels he is having COPD flare-up Advanced Directive planning discussed; patient request printed information- will mail to patient Reviewed upcoming provider appointments with patient/ caregiver and confirmed that patient has plans to attend all as scheduled: Follow up PCP provider office visit scheduled 03/24/21 Self-Care Activities:  Self administers medications as prescribed Attends all scheduled provider appointments Calls pharmacy for medication refills Performs ADL's and iADL's independently Calls provider office for new concerns or questions Patient Goals: Develop a rescue plan for episodes of shortness of breath, difficulty breathing and follow rescue plan if symptoms flare-up Think about the things that cause your episodes of shortness of breath and eliminate symptom triggers at home Please try to completely quit smoking: this is the most important thing you can do to help your breathing improve Attend all doctor appointments  Take all medications as prescribed Contact your doctor if you develop early signs of difficulty breathing- don't wait until the last minute to take action Follow Up Plan:  Telephone follow up appointment with care management team member scheduled for:  Tuesday February 02, 2021 at 10:15 am The patient has been provided with contact information for the care management team and has been advised to call with any health related questions or concerns.      Patient Care Plan: Diabetes Type 2 (Adult)      Problem Identified: Glycemic Management (Diabetes, Type 2)   Priority: Medium     Long-Range Goal: Glycemic Management Optimized   Start Date: 01/14/2021  Expected End Date: 01/14/2022  This Visit's Progress: On track  Priority: Medium  Note:   Objective:  Lab Results  Component Value Date   HGBA1C 7.5 (A) 12/22/2020   Lab Results  Component Value Date   CREATININE 1.10 10/28/2020   CREATININE 0.85 10/21/2020   CREATININE 0.89 10/20/2020   No results found for: EGFR Current Barriers:  Knowledge Deficits related to basic Diabetes pathophysiology and self care/management Difficulty obtaining or cannot afford medications: reports today he is unable to afford the insulin that PCP has prescribed: verified that PCP is aware and considering options Financial Constraints: reports difficulty affording food, paying for utilities: [place] referral placed Language barrier with limited health literacy: requires interpreter services and frequent repetition/ reinforcement Unable to independently verbalize appropriate diet in setting of DM Case Manager Clinical Goal(s):  Over the next 12 months, patient will demonstrate improved adherence to prescribed treatment plan for diabetes self care/management as evidenced by patient reporting during CCM RN CM outreach of: Daily monitoring and recording of blood sugars at home 3-4 times per  day Improved adherence to ADA/ carb modified, low sugar diet  Adherence to prescribed medication regimen  Contacting provider for new or worsened symptoms or questions Interventions:  Collaboration with [name], MD regarding development and update of comprehensive plan of care as evidenced by provider attestation and co-signature Inter-disciplinary care team collaboration (see longitudinal plan of care) Review of patient status, including review of consultants reports, relevant laboratory and other test results, and medications  completed Discussed current clinical condition with patient, confirmed no current clinical concerns Assessed patient's baseline knowledge around self-health management of DM: confirmed patient has limited health literacy; provided education to patient about basic DM disease process, risk of complications of high blood sugars over time, need for ongoing routine self-care activities Reviewed with patient recent blood sugars at home: reports he monitors blood sugars 3-4 times per day; reports consistently "high" blood sugars in ranges > 200 frequently; reports fasting blood sugar this morning of "280."  Does not record blood sugars on paper- encouraged to begin doing for future review together, encouraged him to continue monitoring/ recording blood sugars at home Discussed medications for DM: patient declines medication review today, states he is not near his medications; confirms he manages medications independently; reports he is unable to afford his current insulin, states PCP is currently "working" on this- states he is waiting to hear back prior to running out of the insulin he can not afford, denies other medications concerns today, agreeable to full medication review at time of next CCM RN CM telephone appointment Confirmed patient does not follow DM diet: reports eating white foods, rice, bread, potatoes regularly; does not drink soda or sweetened drinks: will provide printed educational material; encouraged patient to review and begin incorporating small changes into his diet to improve and decrease blood sugars Reviewed scheduled/upcoming provider appointments including: PCP follow up visit 03/24/21 Discussed plans with patient for ongoing care management follow up and provided patient with direct contact information for care management team Self-Care Activities Self administers oral medications as prescribed Self administers insulin as prescribed Attends all scheduled provider appointments Checks  blood sugars 3-4 times per day Patient Goals: Continue checking your blood sugars at home 4 times a day: always check it first thing in the morning before you eat and then again several times, about 2 hours after a meal Check blood sugars more often if you feel bad or if I feel it may be too high or too low Write blood sugar readings from home down on paper- we will review these with each of our phone call appointments Take the blood sugars you have written down on paper to all doctor visits  Continue taking your medications as prescribed Try to decrease the amount of high carbohydrate and high sugar foods you are eating, such as white bread and white rice Please read over the book "Living Well with Diabetes" this is a great resource to learn about what foods are good to eat Follow Up Plan:  Telephone follow up appointment with care management team member scheduled for: Tuesday February 02, 2021 at 10:15 am The patient has been provided with contact information for the care management team and has been advised to call with any health related questions or concerns.

## 2021-01-15 ENCOUNTER — Other Ambulatory Visit: Payer: Self-pay | Admitting: Emergency Medicine

## 2021-01-15 MED ORDER — INSULIN DEGLUDEC 100 UNIT/ML ~~LOC~~ SOPN: 20.0000 [IU] | PEN_INJECTOR | Freq: Every day | SUBCUTANEOUS | 5 refills | Status: DC

## 2021-01-15 NOTE — Telephone Encounter (Signed)
Will use [place] instead.  New prescription sent to pharmacy of record.  Thanks.

## 2021-01-19 ENCOUNTER — Telehealth: Payer: Self-pay

## 2021-01-19 DIAGNOSIS — Z7984 Long term (current) use of oral hypoglycemic drugs: Secondary | ICD-10-CM | POA: Diagnosis not present

## 2021-01-19 DIAGNOSIS — Z794 Long term (current) use of insulin: Secondary | ICD-10-CM | POA: Diagnosis not present

## 2021-01-19 DIAGNOSIS — Z809 Family history of malignant neoplasm, unspecified: Secondary | ICD-10-CM | POA: Diagnosis not present

## 2021-01-19 DIAGNOSIS — R69 Illness, unspecified: Secondary | ICD-10-CM | POA: Diagnosis not present

## 2021-01-19 DIAGNOSIS — I10 Essential (primary) hypertension: Secondary | ICD-10-CM | POA: Diagnosis not present

## 2021-01-19 DIAGNOSIS — Z7952 Long term (current) use of systemic steroids: Secondary | ICD-10-CM | POA: Diagnosis not present

## 2021-01-19 DIAGNOSIS — E785 Hyperlipidemia, unspecified: Secondary | ICD-10-CM | POA: Diagnosis not present

## 2021-01-19 DIAGNOSIS — E1151 Type 2 diabetes mellitus with diabetic peripheral angiopathy without gangrene: Secondary | ICD-10-CM | POA: Diagnosis not present

## 2021-01-19 DIAGNOSIS — J439 Emphysema, unspecified: Secondary | ICD-10-CM | POA: Diagnosis not present

## 2021-01-19 DIAGNOSIS — K219 Gastro-esophageal reflux disease without esophagitis: Secondary | ICD-10-CM | POA: Diagnosis not present

## 2021-01-19 NOTE — Telephone Encounter (Signed)
   Telephone encounter was:  Unsuccessful.  01/19/2021 Name: Duane Cuevas MRN: [id number] DOB: 11/20/1955  Unsuccessful outbound call made today to assist with:   food, utility and rent .  Outreach Attempt:  1st Attempt  A HIPAA compliant voice message was left requesting a return call.  Instructed patient to call back at [phone].  Kathye Cipriani, AAS Paralegal, [place] [facility]  [address], [place] [address] ??[email or web address]  ?? [phone]   [email or web address]

## 2021-01-22 ENCOUNTER — Telehealth: Payer: Self-pay

## 2021-01-22 NOTE — Telephone Encounter (Signed)
   Telephone encounter was:  Unsuccessful.  01/22/2021 Name: Duane Cuevas MRN: [id number] DOB: 24-Sep-1955  Unsuccessful outbound call made today to assist with:  Called patient with assistance from [facility] ID [id number].  Interpreter left message on voicemail for patient to return my call regarding food and utilities.   Outreach Attempt:  3rd Attempt.  Referral closed unable to contact patient.  A HIPAA compliant voice message was left requesting a return call.  Instructed patient to call back at [phone].  Tomisha Reppucci, AAS Paralegal, [place] [facility]  [address], [place] [address] ??[email or web address]  ?? [phone]   [email or web address]

## 2021-02-02 ENCOUNTER — Telehealth: Payer: Medicare HMO

## 2021-02-02 ENCOUNTER — Telehealth: Payer: Self-pay | Admitting: *Deleted

## 2021-02-02 ENCOUNTER — Encounter: Payer: Self-pay | Admitting: *Deleted

## 2021-02-02 NOTE — Telephone Encounter (Signed)
  Chronic Care Management   Follow Up Note   02/02/2021 Name: Duane Cuevas MRN: [id number] DOB: [birth year]   Referred by: [name], MD Reason for referral : No chief complaint on file.  An unsuccessful telephone outreach was attempted today. The patient was referred to the case management team for assistance with care management and care coordination.  [place] services engaged to assist with call: [facility]if[facility] James ID [phone] Attempted scheduled follow up call to patient's stated preferred number (not on file: [phone]) as well as to alternate number on file, [phone]; both unsuccessful   Follow Up Plan:   A HIPPA compliant phone message was left for the patient providing contact information and requesting a return call Will place request with scheduling care guide to contact patient to re-schedule today's missed telephone call appointment  [name], RN, BSN, [place] [phone]: direct office [phone]: mobile

## 2021-02-05 NOTE — Telephone Encounter (Signed)
Patient has been rescheduled.

## 2021-02-15 ENCOUNTER — Other Ambulatory Visit (HOSPITAL_COMMUNITY): Payer: Self-pay

## 2021-02-15 MED FILL — Pantoprazole Sodium EC Tab 40 MG (Base Equiv): ORAL | 30 days supply | Qty: 60 | Fill #1 | Status: AC

## 2021-02-19 ENCOUNTER — Other Ambulatory Visit: Payer: Self-pay | Admitting: Emergency Medicine

## 2021-02-19 DIAGNOSIS — J449 Chronic obstructive pulmonary disease, unspecified: Secondary | ICD-10-CM

## 2021-02-24 ENCOUNTER — Ambulatory Visit (INDEPENDENT_AMBULATORY_CARE_PROVIDER_SITE_OTHER): Payer: Medicare HMO | Admitting: *Deleted

## 2021-02-24 DIAGNOSIS — E1165 Type 2 diabetes mellitus with hyperglycemia: Secondary | ICD-10-CM

## 2021-02-24 DIAGNOSIS — J439 Emphysema, unspecified: Secondary | ICD-10-CM | POA: Diagnosis not present

## 2021-02-24 NOTE — Chronic Care Management (AMB) (Signed)
Chronic Care Management   CCM RN Visit Note  02/24/2021 Name: Duane Cuevas MRN: [id number] DOB: [birth year]  Subjective: Duane Cuevas is a 65 y.o. year old male who is a primary care patient of Sagard[place], Duane Bloomer, MD. The care management team was consulted for assistance with disease management and care coordination needs.    Engaged with patient by telephone for follow up visit in response to provider referral for case management and/or care coordination services, using [address] interpreter "[place], [place] [id number]."  Second call placed to patient's English-speaking caregiver/ daughter Duane Cuevas- on [facility] DPR to update and discuss overall plan of care   Consent to Services:  The patient was given information about Chronic Care Management services, agreed to services, and gave verbal consent prior to initiation of services.  Please see initial visit note for detailed documentation.  Patient agreed to services and verbal consent obtained.   Assessment: Review of patient past medical history, allergies, medications, health status, including review of consultants reports, laboratory and other test data, was performed as part of comprehensive evaluation and provision of chronic care management services.   CCM Care Plan  Not on File  Outpatient Encounter Medications as of 02/24/2021  Medication Sig Note   albuterol (VENTOLIN HFA) 108 (90 Base) MCG/ACT inhaler Inhale 2 puffs into the lungs every 6 (six) hours as needed for wheezing or shortness of breath. 02/24/2021: 02/24/21: Patient reports today that he uses 3-4 times per day   amLODipine (NORVASC) 5 MG tablet Take 1 tablet (5 mg total) by mouth daily.    atorvastatin (LIPITOR) 20 MG tablet TAKE ONE TABLET BY MOUTH ONE TIME DAILY    budesonide-formoterol (SYMBICORT) 160-4.5 MCG/ACT inhaler Inhale 2 puffs into the lungs 2 (two) times daily.    glipiZIDE (GLUCOTROL) 5 MG tablet TAKE ONE TABLET BY MOUTH ONE TIME DAILY WITH  BREAKFAST    insulin degludec (TRESIBA) 100 UNIT/ML FlexTouch Pen Inject 20 Units into the skin daily.    pantoprazole (PROTONIX) 40 MG tablet TAKE 1 TABLET BY MOUTH 2 TIMES DAILY BEFORE MEALS (Patient taking differently: Take 40 mg by mouth 2 (two) times daily.)    albuterol (PROVENTIL) (2.5 MG/3ML) 0.083% nebulizer solution USE 1 VIAL VIA NEBULIZER EVERY 6 HOURS AS NEEDED FOR WHEEZING OR SHORTNESS OF BREATH (Patient not taking: Reported on 02/24/2021)    benzonatate (TESSALON) 100 MG capsule Take 1 capsule (100 mg total) by mouth every 8 (eight) hours. (Patient not taking: Reported on 02/24/2021)    blood glucose meter kit and supplies KIT Dispense based on patient and insurance preference. Use up to four times daily as directed. 12/22/2020: use   blood glucose meter kit and supplies Dispense based on patient and insurance preference. Use up to four times daily as directed. (FOR ICD-10 E10.9, E11.9). 12/22/2020: use   glipiZIDE (GLUCOTROL) 10 MG tablet Take 1 tablet (10 mg total) by mouth 2 (two) times daily before a meal. (Patient taking differently: Take 10 mg by mouth daily before breakfast. Takes 1 more tablet if blood sugar is high)    glucose blood (ACCU-CHEK GUIDE) test strip Use as instructed 12/22/2020: use   guaiFENesin-dextromethorphan (ROBITUSSIN DM) 100-10 MG/5ML syrup Take 5 mLs by mouth every 4 (four) hours as needed for cough (chest congestion). (Patient not taking: No sig reported)    insulin lispro (HUMALOG) 100 UNIT/ML KwikPen Inject 3 Units into the skin 3 (three) times daily. Adjust amount of insulin as per sliding scale. (Patient taking differently: Inject 3-15 Units into  the skin 3 (three) times daily. Adjust amount of insulin as per sliding scale.) 02/24/2021: 02/24/21: Patient reports using as prescribed;  uses 4 times per day per patient report; reports uses generally 3 units per meal    insulin lispro (HUMALOG) 100 UNIT/ML KwikPen INJECT 3 UNITS INTO THE SKIN 3 TIMES DAILY AS  DIRECTED. (DISCARD PEN AFTER 28 DAYS) 02/24/2021: 02/24/21: Appears to be duplicate order; patient reports taking as noted in duplicate order   Insulin Pen Needle 32G X 4 MM MISC USE TO INJECT LANTUS AND HUMALOG 4 TIMES DAILY 12/22/2020: use   metFORMIN (GLUCOPHAGE) 1000 MG tablet Take 1 tablet (1,000 mg total) by mouth 2 (two) times daily with a meal. 02/24/2021: 02/24/21: Reports taking as prescribed, takes in morning    methylPREDNISolone (MEDROL DOSEPAK) 4 MG TBPK tablet Sig as indicated (Patient not taking: Reported on 02/24/2021)    ondansetron (ZOFRAN ODT) 4 MG disintegrating tablet 4mg  ODT q4 hours prn nausea/vomit (Patient not taking: Reported on 02/24/2021)    oseltamivir (TAMIFLU) 75 MG capsule Take 1 capsule (75 mg total) by mouth every 12 (twelve) hours. (Patient not taking: No sig reported)    Facility-Administered Encounter Medications as of 02/24/2021  Medication   morphine 2 MG/ML injection   Patient Active Problem List   Diagnosis Date Noted   Essential hypertension 09/21/2020   Other dysphagia    Hyperglycemia 09/13/2020   Uncontrolled type 2 diabetes mellitus with hyperglycemia ([place]) 09/13/2020   CAD (coronary artery disease) 01/23/2020   Hyperlipidemia 11/02/2019   History of MI (myocardial infarction) 11/02/2019   Cigarette smoker 08/15/2018   COPD ? GOLD III/ active smoker 08/14/2018   History of diet-controlled diabetes 06/30/2018   COPD (chronic obstructive pulmonary disease) ([place]) 10/14/2014   Type 2 diabetes mellitus with hyperglycemia, without long-term current use of insulin ([place]) 10/14/2014   Conditions to be addressed/monitored:  COPD and DMII  Care Plan : COPD (Adult)  Updates made by Knox Royalty, RN since 02/24/2021 12:00 AM     Problem: Symptom Exacerbation (COPD)   Priority: Medium     Long-Range Goal: Symptom Exacerbation Prevented or Minimized   Start Date: 01/14/2021  Expected End Date: 01/14/2022  This Visit's Progress: On track  Recent  Progress: On track  Priority: Medium  Note:   Current Barriers:  Knowledge Deficits related to basic COPD self care/management Continues to smoke "1-2" cigarettes per day" Difficulty obtaining or cannot afford medications: reports 02/24/21 that he has obtained the new insulin that PCP has prescribed Tyler Aas)-- however, he reports he is still using the Lantus that he has at home until he finishes it; has not yet started using [place] per patient report Financial Constraints: reports difficulty affording food, paying for utilities: [place] referral placed: confirmed 02/24/21 that [employer] care guide has spoken with patient Language barrier with limited health literacy: requires interpreter services and frequent repetition/ reinforcement-- 02/24/21: confirmed with patient that his daughter is no longer working: reports okay to contact English-speaking daughter [name], on [place] Unable to independently verbalize appropriate diet in setting of DM Case Manager Clinical Goal(s):  01/14/21: Over the next 12 months, patient will demonstrate improved adherence to prescribed treatment plan for COPD self care/management as evidenced by patient reporting during CCM RN CM outreach of: Utilizing pursed lip breathing for shortness of breath Verbalize understanding of COPD action plan and when to seek appropriate levels of medical care Verbalize basic understanding of COPD disease process and self care activities  Interventions:  Collaboration with [name], MD regarding development and update of comprehensive plan of care as evidenced by provider attestation and co-signature Inter-disciplinary care team collaboration (see longitudinal plan of care) Chart reviewed including relevant office notes, upcoming scheduled appointments, and lab results Discussed current  clinical condition with patient and confirmed no current acute clinical concerns; confirmed patient manages  medications independently; medication review completed with patient today: several concerns identified: patient verbalizes significant confusion around use of maintenance inhaler vs. rescue inhaler; reports today that he is using rescue inhaler "3- 4 times per day; whenever I need it;" he endorses that he is using maintenance inhaler twice a day; attempted to provide education around appropriate use of rescue inhaler, however, patient seems unable to comprehend difference between these inhalers; additionally, he reports he has not yet heard back from PCP office team regarding status of nebulizer, prescribed at time of hospital discharge in April 2022; will place CCM pharmacy referral and will again request follow up from PCP office team Patient describes breathing status "same as always;" he denies acute clinical concerns today, reports continues to use home O2 "when I need it;" he is again unable to tell me what level he runs his home O2 at; again reports that he only has a oxygen tank at home (no concentrator) that he "takes to the medical supply store" to get refilled Continues to report that he smokes 2 cigarettes per day: one in morning and one in evening: complete smoking cessation again encouraged Reiterated with patient previously provided education around action plan for breathing issues at home, importance of daily self- assessment Reviewed upcoming provider appointments with patient/ caregiver and confirmed that patient has plans to attend all as scheduled: Follow up PCP provider office visit scheduled 03/24/21: explained to patient and his daughter to inquire about need for nebulizer if they have not heard follow up by then; also made them aware to listen out for phone call from [place] team to schedule either an in-person or telephone visit to address patient's multiple medication issues/ concerns Self-Care Activities:  Self administers medications as prescribed Attends all scheduled provider  appointments Calls pharmacy for medication refills Performs ADL's and iADL's independently Calls provider office for new concerns or questions Patient Goals: I have again asked Dr. Mitchel Honour to address your lack of receiving your nebulizer; please talk to Dr. Mitchel Honour about this when you visit him on March 24, 2021 if you have not received follow up by then I have asked the Pharmacy team at Dr. Salley Scarlet office to contact your daughter to schedule an in-person or telephone visit with you to discuss your use of your inhalers: please listen out for a phone call from the pharmacy team to schedule this visit Develop a rescue plan for episodes of shortness of breath, difficulty breathing and follow rescue plan if symptoms flare-up Think about the things that cause your episodes of shortness of breath and eliminate symptom triggers at home Please try to completely quit smoking: this is the most important thing you can do to help your breathing improve Attend all doctor appointments  Take all medications as prescribed Contact your doctor if you develop early signs of difficulty breathing- don't wait until the last minute to take action Follow Up Plan:  Telephone follow up appointment with care management team member scheduled for:  Monday, May 24, 2021 at 9:00 am The patient has been provided with contact information for the care management team and has been advised to call  with any health related questions or concerns.      Care Plan : Diabetes Type 2 (Adult)  Updates made by Knox Royalty, RN since 02/24/2021 12:00 AM     Problem: Glycemic Management (Diabetes, Type 2)   Priority: Medium     Long-Range Goal: Glycemic Management Optimized   Start Date: 01/14/2021  Expected End Date: 01/14/2022  This Visit's Progress: On track  Recent Progress: On track  Priority: Medium  Note:   Objective:  Lab Results  Component Value Date   HGBA1C 7.5 (A) 12/22/2020   Lab Results  Component  Value Date   CREATININE 1.10 10/28/2020   CREATININE 0.85 10/21/2020   CREATININE 0.89 10/20/2020   No results found for: EGFR Current Barriers:  Knowledge Deficits related to basic COPD self care/management Continues to smoke "1-2" cigarettes per day" Difficulty obtaining or cannot afford medications: reports 02/24/21 that he has obtained the new insulin that PCP has prescribed Tyler Aas)-- however, he reports he is still using the Lantus that he has at home until he finishes it; has not yet started using [place] per patient report Financial Constraints: reports difficulty affording food, paying for utilities: [place] referral placed: confirmed 02/24/21 that [employer] care guide has spoken with patient Language barrier with limited health literacy: requires interpreter services and frequent repetition/ reinforcement-- 02/24/21: confirmed with patient that his daughter is no longer working: reports okay to contact English-speaking daughter [name], on [place] Unable to independently verbalize appropriate diet in setting of DM Case Manager Clinical Goal(s):  01/14/21: Over the next 12 months, patient will demonstrate improved adherence to prescribed treatment plan for diabetes self care/management as evidenced by patient reporting during CCM RN CM outreach of: Daily monitoring and recording of blood sugars at home 3-4 times per day Improved adherence to ADA/ carb modified, low sugar diet  Adherence to prescribed medication regimen  Contacting provider for new or worsened symptoms or questions Interventions:  Collaboration with [name], MD regarding development and update of comprehensive plan of care as evidenced by provider attestation and co-signature Inter-disciplinary care team collaboration (see longitudinal plan of care) Review of patient status, including review of consultants reports, relevant laboratory and other test results, and medications  completed Discussed current clinical condition with patient, confirmed no current clinical concerns Confirmed that patient heard from PCP office to change his long-acting insulin from Lantus to tresiba- has obtained but has not yet started using, as he reports he still has Lantus left over and is trying to finish his home supply Reviewed with patient recent blood sugars at home: he reports fasting blood sugars "between 135-140;" however, his pre-meal blood sugars he reports running "between 285-395;" confirms he is using meal-time insulin (lispro) as per sliding scale instructions: states he is generally using "3 units" with each meal Patient tells me today that he has been experiencing weekly episodes of symptomatic hypoglycemia "in the middle of the night;" states he is awakened from sleep, sweaty, shaky, sick feeling;" reports his blood sugars are "35-45;" states he corrects by drinking a "full-strength soda" or by "eating a pice of bread"-- action plan for hypoglycemia reviewed with patient; encouraged him to keep something by his bed for easy access if necessary; discussed risks of hypoglycemia with patient and with his English-speaking daughter at time of separate call Confirmed patient is now recording on paper his blood sugars: encouraged him and caregiver to bring this list with them to upcoming PCP provider appointment 03/24/21 Confirmed  patient continues to essentially follow regular diet: limited health literacy; again reinforced efforts to try and decrease carbohydrate/ sugar intake Confirmed patient received printed Spanish version of Living Well With Diabetes booklet- states he has begun reviewing; encouraged his ongoing review, and for him to begin making small dietary changes to incorporate into his regular meal choices; reiterated previously provided education around long-term complications of high blood sugars Confirmed patient does not understand A1-C levels/ significance: basic verbal  education provided; will continue to expand on education reinforcement during future visits Reviewed scheduled/upcoming provider appointments including: PCP follow up visit 03/24/21 Discussed plans with patient for ongoing care management follow up and provided patient with direct contact information for care management team Self-Care Activities Self administers oral medications as prescribed Self administers insulin as prescribed Attends all scheduled provider appointments Checks blood sugars 3-4 times per day Patient Goals: Continue checking your blood sugars at home 4 times a day: always check it first thing in the morning before you eat and then again several times, before eating a meal so you know how much sliding scale insulin to use; it is also a good idea to check your blood sugar before you go to bed at night I have made Dr. Mitchel Honour aware of your reports of recent low blood sugars in the middle of the night: I have asked the pharmacy team at his office to contact your daughter to schedule an in-person or telephone visit with you to review the medications you take for your diabetes: please listen out for a call from the pharmacy team to schedule this visit Check blood sugars more often if you feel bad or if I feel it may be too high or too low Write blood sugar readings from home down on paper- we will review these with each of our phone call appointments Take the blood sugars you have written down on paper to all doctor visits-- you have a visit with Dr. Mitchel Honour on March 24, 2021 at 9:40 am Continue taking your medications as prescribed Try to decrease the amount of high carbohydrate and high sugar foods you are eating, such as white bread and white rice Please continue to read over the book "Living Well with Diabetes" this is a great resource to learn about what foods are good to eat: try to begin making small changes to your diet to decrease the carbohydrates and sugar in your  diet Follow Up Plan:  Telephone follow up appointment with care management team member scheduled for: Monday, May 24, 2021 at 9:00 am The patient has been provided with contact information for the care management team and has been advised to call with any health related questions or concerns.      Plan: Telephone follow up appointment with care management team member scheduled for:  Monday, May 24, 2021 at 9:00 am The patient has been provided with contact information for the care management team and has been advised to call with any health related questions or concerns.   [name], RN, BSN, [facility] RN Care Coordination- [place] [phone]: direct office [phone]: mobile

## 2021-02-24 NOTE — Patient Instructions (Signed)
Visit Information  Dannon and [name]: it was nice talking with you both today.   Please read over the attached information, and continue to write down your blood sugars each day: take these blood sugars recorded on paper to your office visit with Dr. Mitchel Honour on March 24, 2021 so he can review these.  I have asked the Pharmacy team at Dr. Barry Brunner office to contact [name] to schedule an in-person or telephone appointment with her to review your medications for diabetes and COPD- please listen for a call form the Pharmacy team to scheudle this visit.   I look forward to talking to you again for an update on Monday, May 24, 2021 at 9:00 am- please be listening out for my call that day.  I will call as close to 9:00 am as possible.   If you need to cancel or re-schedule our telephone visit, please call [phone] and one of our care guides will be happy to assist you.   I look forward to hearing about your progress.   Please don't hesitate to contact me if I can be of assistance to you before our next scheduled telephone appointment.   [name], RN, BSN, [facility] RN Care Coordination- [place] [phone]: direct office [phone]: mobile   PATIENT GOALS:  Goals Addressed             This Visit's Progress    Monitor and Manage My Blood Sugar-Diabetes Type 2   On track    Timeframe:  Long-Range Goal Priority:  Medium Start Date:      01/14/21                       Expected End Date:     01/14/21                  Follow Up Date 05/24/21    Continue checking your blood sugars at home 4 times a day: always check it first thing in the morning before you eat and then again several times, before eating a meal so you know how much sliding scale insulin to use; it is also a good idea to check your blood sugar before you go to bed at night I have made Dr. Mitchel Honour aware of your reports of recent low blood sugars in the middle of the  night: I have asked the pharmacy team at his office to contact your daughter to schedule an in-person or telephone visit with you to review the medications you take for your diabetes: please listen out for a call from the pharmacy team to schedule this visit Check blood sugars more often if you feel bad or if I feel it may be too high or too low Write blood sugar readings from home down on paper- we will review these with each of our phone call appointments Take the blood sugars you have written down on paper to all doctor visits-- you have a visit with Dr. Mitchel Honour on March 24, 2021 at 9:40 am Continue taking your medications as prescribed Try to decrease the amount of high carbohydrate and high sugar foods you are eating, such as white bread and white rice Please continue to read over the book "Living Well with Diabetes" this is a great resource to learn about what foods are good to eat: try to begin making small changes to your diet to decrease the carbohydrates and sugar in your diet   Why  is this important?   Checking your blood sugar at home helps to keep it from getting very high or very low.  Writing the results in a diary or log helps the doctor know how to care for you.  Your blood sugar log should have the time, date and the results.  Also, write down the amount of insulin or other medicine that you take.  Other information, like what you ate, exercise done and how you were feeling, will also be helpful.          Track and Manage My Symptoms-COPD   On track    Timeframe:  Long-Range Goal Priority:  Medium Start Date:        01/14/21                     Expected End Date:     01/14/22                  Follow Up Date 05/24/21    I have again asked Dr. Mitchel Honour to address your lack of receiving your nebulizer; please talk to Dr. Mitchel Honour about this when you visit him on March 24, 2021 if you have not received follow up by then I have asked the Pharmacy team at Dr. Salley Scarlet  office to contact your daughter to schedule an in-person or telephone visit with you to discuss your use of your inhalers: please listen out for a phone call from the pharmacy team to schedule this visit Develop a rescue plan for episodes of shortness of breath, difficulty breathing and follow rescue plan if symptoms flare-up Think about the things that cause your episodes of shortness of breath and eliminate symptom triggers at home Please try to completely quit smoking: this is the most important thing you can do to help your breathing improve Attend all doctor appointments  Take all medications as prescribed Contact your doctor if you develop early signs of difficulty breathing- don't wait until the last minute to take action   Why is this important?   Tracking your symptoms and other information about your health helps your doctor plan your care.  Write down the symptoms, the time of day, what you were doing and what medicine you are taking.  You will soon learn how to manage your symptoms.            Hypoglycemia Hypoglycemia is when the sugar (glucose) level in your blood is too low. Low blood sugar can happen to people who have diabetes and people who do not have diabetes. Low blood sugar can happenquickly, and it can be an emergency. What are the causes? This condition happens most often in people who have diabetes. It may be caused by: Diabetes medicine. Not eating enough, or not eating often enough. Doing more physical activity. Drinking alcohol on an empty stomach. If you do not have diabetes, this condition may be caused by: A tumor in the pancreas. Not eating enough, or not eating for long periods at a time (fasting). A very bad infection or illness. Problems after having weight loss (bariatric) surgery. Kidney failure or liver failure. Certain medicines. What increases the risk? This condition is more likely to develop in people who: Have diabetes and take medicines  to lower their blood sugar. Abuse alcohol. Have a very bad illness. What are the signs or symptoms? Mild Hunger. Sweating and feeling clammy. Feeling dizzy or light-headed. Being sleepy or having trouble sleeping. Feeling like you may vomit (nauseous).  A fast heartbeat. A headache. Blurry vision. Mood changes, such as: Being grouchy. Feeling worried or nervous (anxious). Tingling or loss of feeling (numbness) around your mouth, lips, or tongue. Moderate Confusion and poor judgment. Behavior changes. Weakness. Uneven heartbeat. Trouble with moving (coordination). Very low Very low blood sugar (severe hypoglycemia) is a medical emergency. It can cause: Fainting. Seizures. Loss of consciousness (coma). Death. How is this treated? Treating low blood sugar Low blood sugar is often treated by eating or drinking something that has sugar in it right away. The food or drink should contain 15 grams of a fast-acting carb (carbohydrate). Options include: 4 oz (120 mL) of fruit juice. 4 oz (120 mL) of regular soda (not diet soda). A few pieces of hard candy. Check food labels to see how many pieces to eat for 15 grams. 1 Tbsp (15 mL) of sugar or honey. 4 glucose tablets. 1 tube of glucose gel. Treating low blood sugar if you have diabetes If you can think clearly and swallow safely, follow the 15:15 rule: Take 15 grams of a fast-acting carb. Talk with your doctor about how much you should take. Always keep a source of fast-acting carb with you, such as: Glucose tablets (take 4 tablets). A few pieces of hard candy. Check food labels to see how many pieces to eat for 15 grams. 4 oz (120 mL) of fruit juice. 4 oz (120 mL) of regular soda (not diet soda). 1 Tbsp (15 mL) of honey or sugar. 1 tube of glucose gel. Check your blood sugar 15 minutes after you take the carb. If your blood sugar is still at or below 70 mg/dL (3.9 mmol/L), take 15 grams of a carb again. If your blood sugar  does not go above 70 mg/dL (3.9 mmol/L) after 3 tries, get help right away. After your blood sugar goes back to normal, eat a meal or a snack within 1 hour.  Treating very low blood sugar If your blood sugar is below 54 mg/dL (3 mmol/L), you have very low blood sugar, or severe hypoglycemia. This is an emergency. Get medical help right away. If you have very low blood sugar and you cannot eat or drink, you will need to be given a hormone called glucagon. A family member or friend should learn how to check your blood sugar and how to give you glucagon. Ask your doctor if youneed to have an emergency glucagon kit at home. Very low blood sugar may also need to be treated in a hospital. Follow these instructions at home: General instructions Take over-the-counter and prescription medicines only as told by your doctor. Stay aware of your blood sugar as told by your doctor. If you drink alcohol: Limit how much you have to: 0-1 drink a day for women who are not pregnant. 0-2 drinks a day for men. Know how much alcohol is in your drink. In the [place] one drink equals one 12 oz bottle of beer (355 mL), one 5 oz glass of wine (148 mL), or one 1 oz glass of hard liquor (44 mL). Be sure to eat food when you drink alcohol. Know that your body absorbs alcohol quickly. This may lead to low blood sugar later. Be sure to keep checking your blood sugar. Keep all follow-up visits. If you have diabetes:  Always have a fast-acting carb (15 grams) with you to treat low blood sugar. Follow your diabetes care plan as told by your doctor. Make sure you: Know the symptoms of low blood  sugar. Check your blood sugar as often as told. Always check it before and after exercise. Always check your blood sugar before you drive. Take your medicines as told. Follow your meal plan. Eat on time. Do not skip meals. Share your diabetes care plan with: Your work or school. People you live with. Carry a card or wear jewelry  that says you have diabetes.  Where to find more information American Diabetes Association: www.diabetes.org Contact a doctor if: You have trouble keeping your blood sugar in your target range. You have low blood sugar often. Get help right away if: You still have symptoms after you eat or drink something that contains 15 grams of fast-acting carb, and you cannot get your blood sugar above 70 mg/dL by following the 15:15 rule. Your blood sugar is below 54 mg/dL (3 mmol/L). You have a seizure. You faint. These symptoms may be an emergency. Get help right away. Call your local emergency services (911 in the [place] Do not wait to see if the symptoms will go away. Do not drive yourself to the hospital. Summary Hypoglycemia happens when the level of sugar (glucose) in your blood is too low. Low blood sugar can happen to people who have diabetes and people who do not have diabetes. Low blood sugar can happen quickly, and it can be an emergency. Make sure you know the symptoms of low blood sugar and know how to treat it. Always keep a source of sugar (fast-acting carb) with you to treat low blood sugar. This information is not intended to replace advice given to you by your health care provider. Make sure you discuss any questions you have with your healthcare provider. Document Revised: 05/21/2020 Document Reviewed: 05/21/2020 Elsevier Patient Education  2022 [place] caregiver verbalizes understanding of instructions provided today and agrees to view in MyChart: English-speaking daughter on [place] confirmed that she manages patient's My-Chart updates and she confirms that it is fine to send AVS in English language Telephone follow up appointment with care management team member scheduled for:  Monday, May 24, 2021 at 9:00 am The patient has been provided with contact information for the care management team and has been advised to call with any health related questions or  concerns.   [name], RN, BSN, [facility] RN Care Coordination- [place] [phone]: direct office [phone]: mobile

## 2021-02-25 ENCOUNTER — Telehealth: Payer: Self-pay | Admitting: Lab

## 2021-02-25 NOTE — Chronic Care Management (AMB) (Signed)
  Chronic Care Management   Note  02/25/2021 Name: Shigeki Furno MRN: [id number] DOB: 02/01/1956  Ade Schettler is a 65 y.o. year old male who is a primary care patient of Mitchel Honour, Ines Bloomer, MD. I reached out to Hettie Holstein by phone today in response to a referral sent by Mr. Glenard Haring Valle-Perez's PCP, [name], MD.   Mr. Penny Pia was given information about Chronic Care Management services today including:  CCM service includes personalized support from designated clinical staff supervised by his physician, including individualized plan of care and coordination with other care providers 24/7 contact phone numbers for assistance for urgent and routine care needs. Service will only be billed when office clinical staff spend 20 minutes or more in a month to coordinate care. Only one practitioner may furnish and bill the service in a calendar month. The patient may stop CCM services at any time (effective at the end of the month) by phone call to the office staff.   [name] Valle verbally agreed to assistance and services provided by embedded care coordination/care management team today.  Follow up plan:   Marcus Hook

## 2021-02-25 NOTE — Progress Notes (Signed)
Patient Scheduled Duane Cuevas  04/09/2021 @ 0900   Thanks  Marjorie Smolder

## 2021-03-24 ENCOUNTER — Ambulatory Visit (INDEPENDENT_AMBULATORY_CARE_PROVIDER_SITE_OTHER): Payer: Medicare HMO | Admitting: Emergency Medicine

## 2021-03-24 ENCOUNTER — Other Ambulatory Visit: Payer: Self-pay

## 2021-03-24 ENCOUNTER — Encounter: Payer: Self-pay | Admitting: Emergency Medicine

## 2021-03-24 VITALS — BP 132/68 | HR 100 | Temp 98.4°F | Ht 68.0 in | Wt 169.0 lb

## 2021-03-24 DIAGNOSIS — R079 Chest pain, unspecified: Secondary | ICD-10-CM

## 2021-03-24 DIAGNOSIS — J439 Emphysema, unspecified: Secondary | ICD-10-CM | POA: Diagnosis not present

## 2021-03-24 DIAGNOSIS — M7918 Myalgia, other site: Secondary | ICD-10-CM

## 2021-03-24 DIAGNOSIS — Z23 Encounter for immunization: Secondary | ICD-10-CM | POA: Diagnosis not present

## 2021-03-24 DIAGNOSIS — E1165 Type 2 diabetes mellitus with hyperglycemia: Secondary | ICD-10-CM | POA: Diagnosis not present

## 2021-03-24 DIAGNOSIS — E1159 Type 2 diabetes mellitus with other circulatory complications: Secondary | ICD-10-CM | POA: Diagnosis not present

## 2021-03-24 DIAGNOSIS — J441 Chronic obstructive pulmonary disease with (acute) exacerbation: Secondary | ICD-10-CM | POA: Diagnosis not present

## 2021-03-24 DIAGNOSIS — I152 Hypertension secondary to endocrine disorders: Secondary | ICD-10-CM | POA: Diagnosis not present

## 2021-03-24 DIAGNOSIS — R69 Illness, unspecified: Secondary | ICD-10-CM | POA: Diagnosis not present

## 2021-03-24 DIAGNOSIS — F17210 Nicotine dependence, cigarettes, uncomplicated: Secondary | ICD-10-CM

## 2021-03-24 LAB — POCT GLYCOSYLATED HEMOGLOBIN (HGB A1C): Hemoglobin A1C: 7.6 % — AB (ref 4.0–5.6)

## 2021-03-24 MED ORDER — PANTOPRAZOLE SODIUM 40 MG PO TBEC: 40.0000 mg | DELAYED_RELEASE_TABLET | Freq: Every day | ORAL | 3 refills | Status: DC

## 2021-03-24 MED ORDER — TRAMADOL HCL 50 MG PO TABS
50.0000 mg | ORAL_TABLET | Freq: Three times a day (TID) | ORAL | 0 refills | Status: AC | PRN
Start: 2021-03-24 — End: 2021-03-29

## 2021-03-24 MED ORDER — INSULIN DEGLUDEC 100 UNIT/ML ~~LOC~~ SOPN: 25.0000 [IU] | PEN_INJECTOR | Freq: Every day | SUBCUTANEOUS | 5 refills | Status: DC

## 2021-03-24 MED ORDER — INSULIN LISPRO (1 UNIT DIAL) 100 UNIT/ML (KWIKPEN): 3.0000 [IU] | PEN_INJECTOR | Freq: Three times a day (TID) | SUBCUTANEOUS | 3 refills | Status: DC

## 2021-03-24 NOTE — Progress Notes (Signed)
Duane Cuevas 65 y.o.   Chief Complaint  Patient presents with   Diabetes    6 month f/u on BP and diabetes. Medication refill protonix, tresiba flex pen, and humalog flex pen [facility] on [address]    HISTORY OF PRESENT ILLNESS: This is a 65 y.o. male with history of diabetes, hypertension and COPD here for follow-up and medication refill. #1 hypertension: On amlodipine 5 mg daily. #2 COPD: On Symbicort twice a day and albuterol as needed #3 diabetes: Takes Tresiba insulin 20 units in the morning and Premeal regular insulin per sliding chart.  Also takes metformin 1000 mg twice a day and glipizide 10 mg daily. Today complaining of study pain to left upper back that radiates to front of the chest and to the left arm for the past 3 days.  Worse with movement or palpation.  Denies injuries.  Denies associated symptoms. Lab Results  Component Value Date   HGBA1C 7.6 (A) 03/24/2021   Lab Results  Component Value Date   CREATININE 1.10 10/28/2020   BUN 19 10/28/2020   NA 137 10/28/2020   K 3.7 10/28/2020   CL 101 10/28/2020   CO2 26 10/28/2020     HPI   Prior to Admission medications   Medication Sig Start Date End Date Taking? Authorizing Provider  albuterol (VENTOLIN HFA) 108 (90 Base) MCG/ACT inhaler Inhale 2 puffs into the lungs every 6 (six) hours as needed for wheezing or shortness of breath. 12/22/20  Yes Novalie Leamy, Ines Bloomer, MD  amLODipine (NORVASC) 5 MG tablet Take 1 tablet (5 mg total) by mouth daily. 12/22/20  Yes Filomeno Cromley, Ines Bloomer, MD  atorvastatin (LIPITOR) 20 MG tablet TAKE ONE TABLET BY MOUTH ONE TIME DAILY 11/16/20  Yes Nalanie Winiecki, Ines Bloomer, MD  blood glucose meter kit and supplies KIT Dispense based on patient and insurance preference. Use up to four times daily as directed. 10/22/20  Yes Tasharra Nodine, Ines Bloomer, MD  blood glucose meter kit and supplies Dispense based on patient and insurance preference. Use up to four times daily as  directed. (FOR ICD-10 E10.9, E11.9). 10/24/20  Yes Wrenn Willcox, Ines Bloomer, MD  budesonide-formoterol [facility]) 160-4.5 MCG/ACT inhaler Inhale 2 puffs into the lungs 2 (two) times daily. 06/22/20  Yes Shanai Lartigue, Ines Bloomer, MD  glipiZIDE (GLUCOTROL) 10 MG tablet Take 1 tablet (10 mg total) by mouth 2 (two) times daily before a meal. Patient taking differently: Take 10 mg by mouth daily before breakfast. Takes 1 more tablet if blood sugar is high 08/21/20  Yes Maximiano Coss, NP  glipiZIDE (GLUCOTROL) 5 MG tablet TAKE ONE TABLET BY MOUTH ONE TIME DAILY WITH BREAKFAST 11/12/20  Yes Tanga Gloor, Ines Bloomer, MD  glucose blood (ACCU-CHEK GUIDE) test strip Use as instructed 10/22/20  Yes Patrice Moates, Ines Bloomer, MD  insulin degludec (TRESIBA) 100 UNIT/ML FlexTouch Pen Inject 20 Units into the skin daily. 01/15/21 04/15/21 Yes Sheza Strickland, Ines Bloomer, MD  insulin lispro (HUMALOG) 100 UNIT/ML KwikPen INJECT 3 UNITS INTO THE SKIN 3 TIMES DAILY AS DIRECTED. (DISCARD PEN AFTER 28 DAYS) 09/16/20  Yes Dahal, Marlowe Aschoff, MD  Insulin Pen Needle 32G X 4 MM MISC USE TO INJECT LANTUS AND HUMALOG 4 TIMES DAILY 09/16/20 09/16/21 Yes Dahal, Marlowe Aschoff, MD  methylPREDNISolone (MEDROL DOSEPAK) 4 MG TBPK tablet Sig as indicated 12/22/20  Yes Kagen Kunath, Ines Bloomer, MD  ondansetron (ZOFRAN ODT) 4 MG disintegrating tablet 4mg  ODT q4 hours prn nausea/vomit 10/28/20  Yes Deno Etienne, DO  oseltamivir (TAMIFLU) 75 MG capsule  Take 1 capsule (75 mg total) by mouth every 12 (twelve) hours. 10/28/20  Yes Deno Etienne, DO  pantoprazole (PROTONIX) 40 MG tablet TAKE 1 TABLET BY MOUTH 2 TIMES DAILY BEFORE MEALS Patient taking differently: Take 40 mg by mouth 2 (two) times daily. 09/16/20 09/16/21 Yes Dahal, Marlowe Aschoff, MD  albuterol (PROVENTIL) (2.5 MG/3ML) 0.083% nebulizer solution USE 1 VIAL VIA NEBULIZER EVERY 6 HOURS AS NEEDED FOR WHEEZING OR SHORTNESS OF BREATH Patient not taking: No sig reported 02/20/21   [name], MD  benzonatate (TESSALON) 100 MG  capsule Take 1 capsule (100 mg total) by mouth every 8 (eight) hours. Patient not taking: Reported on 03/24/2021 10/28/20   Deno Etienne, DO  guaiFENesin-dextromethorphan [facility] DM) 100-10 MG/5ML syrup Take 5 mLs by mouth every 4 (four) hours as needed for cough (chest congestion). Patient not taking: Reported on 03/24/2021 10/21/20   Pokhrel, Corrie Mckusick, MD  insulin lispro (HUMALOG) 100 UNIT/ML KwikPen Inject 3 Units into the skin 3 (three) times daily. Adjust amount of insulin as per sliding scale. Patient taking differently: Inject 3-15 Units into the skin 3 (three) times daily. Adjust amount of insulin as per sliding scale. 09/21/20 12/20/20  [name], MD  metFORMIN (GLUCOPHAGE) 1000 MG tablet Take 1 tablet (1,000 mg total) by mouth 2 (two) times daily with a meal. 06/22/20 09/20/20  [name], MD    Not on File  Patient Active Problem List   Diagnosis Date Noted   Essential hypertension 09/21/2020   Other dysphagia    Hyperglycemia 09/13/2020   Uncontrolled type 2 diabetes mellitus with hyperglycemia ([place]) 09/13/2020   CAD (coronary artery disease) 01/23/2020   Hyperlipidemia 11/02/2019   History of MI (myocardial infarction) 11/02/2019   Cigarette smoker 08/15/2018   COPD ? GOLD III/ active smoker 08/14/2018   History of diet-controlled diabetes 06/30/2018   COPD (chronic obstructive pulmonary disease) ([place]) 10/14/2014   Type 2 diabetes mellitus with hyperglycemia, without long-term current use of insulin ([place]) 10/14/2014    Past Medical History:  Diagnosis Date   Diabetes mellitus ([place]) 10/2014   pt denies being diabetic.    Emphysema of lung ([place])    Emphysema/COPD ([place]) 10/2014   Hepatic steatosis    Thrombocytopenia ([place]) 09/2015   platelets in 120s.     Past Surgical History:  Procedure Laterality Date   BIOPSY  09/16/2020   Procedure: BIOPSY;  Surgeon: Thornton Park, MD;  Location: [facility];  Service: Gastroenterology;;    ESOPHAGOGASTRODUODENOSCOPY N/A 09/23/2015   Procedure: ESOPHAGOGASTRODUODENOSCOPY (EGD);  Surgeon: Ladene Artist, MD;  Location: [facility];  Service: Endoscopy;  Laterality: N/A;   ESOPHAGOGASTRODUODENOSCOPY (EGD) WITH PROPOFOL N/A 09/16/2020   Procedure: ESOPHAGOGASTRODUODENOSCOPY (EGD) WITH PROPOFOL;  Surgeon: Thornton Park, MD;  Location: [facility];  Service: Gastroenterology;  Laterality: N/A;   FOOT SURGERY     INGUINAL HERNIA REPAIR Left 05/15/2018   Procedure: LEFT INGUINAL HERNIA REPAIR WITH MESH;  Surgeon: Erroll Luna, MD;  Location: [facility];  Service: General;  Laterality: Left;   INSERTION OF MESH Left 05/15/2018   Procedure: INSERTION OF MESH;  Surgeon: Erroll Luna, MD;  Location: [facility];  Service: General;  Laterality: Left;   UPPER GASTROINTESTINAL ENDOSCOPY      Social History   Socioeconomic History   Marital status: Married    Spouse name: Zoraida   Number of children: 6   Years of education: Not on file   Highest education level: Not on file  Occupational History   Occupation: Employed  Employer: [employer]  Tobacco Use   Smoking status: Every Day    Packs/day: 0.15    Years: 47.00    Pack years: 7.05    Types: Cigarettes   Smokeless tobacco: Never  Vaping Use   Vaping Use: Former  Substance and Sexual Activity   Alcohol use: Yes    Comment: occ   Drug use: No   Sexual activity: Never  Other Topics Concern   Not on file  Social History Narrative   Lives with wife.  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   Social Determinants of Health   Financial Resource Strain: High Risk   Difficulty of Paying Living Expenses: Hard  Food Insecurity: Food Insecurity Present   Worried About Running Out of Food in the Last Year: Sometimes true   Ran Out of Food in the Last Year: Sometimes true  Transportation Needs: No Transportation Needs   Lack of Transportation (Medical): No   Lack of Transportation (Non-Medical): No  Physical  Activity: Not on file  Stress: Not on file  Social Connections: Not on file  Intimate Partner Violence: Not on file    Family History  Problem Relation Age of Onset   Emphysema Mother    Diabetes Mother    Emphysema Father    Cancer Sister        Stomach cancer   Diabetes Sister    Stomach cancer Sister    Diabetes Brother    Colon cancer Neg Hx    Colon polyps Neg Hx    Esophageal cancer Neg Hx    Rectal cancer Neg Hx      Review of Systems  Constitutional: Negative.  Negative for chills and fever.  HENT: Negative.  Negative for congestion and sore throat.   Respiratory: Negative.  Negative for cough and shortness of breath.   Cardiovascular:  Positive for chest pain. Negative for palpitations.  Gastrointestinal:  Negative for abdominal pain, diarrhea, nausea and vomiting.  Genitourinary: Negative.   Skin: Negative.  Negative for rash.  Neurological:  Negative for dizziness and headaches.  All other systems reviewed and are negative.  Today's Vitals   03/24/21 0937  BP: 132/68  Pulse: 100  Temp: 98.4 F (36.9 C)  TempSrc: Oral  SpO2: 94%  Weight: 169 lb (76.7 kg)  Height: $Remove'5\' 8"'VPitiKv$  (1.727 m)   Body mass index is 25.7 kg/m.  Physical Exam Vitals reviewed.  Constitutional:      Appearance: Normal appearance.  HENT:     Head: Normocephalic.  Eyes:     Extraocular Movements: Extraocular movements intact.     Conjunctiva/sclera: Conjunctivae normal.     Pupils: Pupils are equal, round, and reactive to light.  Cardiovascular:     Rate and Rhythm: Normal rate and regular rhythm.     Pulses: Normal pulses.     Heart sounds: Normal heart sounds.  Pulmonary:     Effort: Pulmonary effort is normal.     Breath sounds: Normal breath sounds.  Chest:     Chest wall: Tenderness (Left upper chest and trapezius muscle) present.  Abdominal:     Palpations: Abdomen is soft.     Tenderness: There is no abdominal tenderness.  Musculoskeletal:     Cervical back: Normal  range of motion and neck supple.     Right lower leg: No edema.     Left lower leg: No edema.     Comments: Positive tenderness and muscle spasm to left trapezius muscle  Skin:    General: Skin is warm and dry.  Neurological:     General: No focal deficit present.     Mental Status: He is alert and oriented to person, place, and time.  Psychiatric:        Mood and Affect: Mood normal.        Behavior: Behavior normal.   EKG: Normal sinus rhythm with ventricular rate of 92/min.  No acute ischemic changes. Results for orders placed or performed in visit on 03/24/21 (from the past 24 hour(s))  POCT glycosylated hemoglobin (Hb A1C)     Status: Abnormal   Collection Time: 03/24/21  9:59 AM  Result Value Ref Range   Hemoglobin A1C 7.6 (A) 4.0 - 5.6 %   HbA1c POC (<> result, manual entry)     HbA1c, POC (prediabetic range)     HbA1c, POC (controlled diabetic range)       ASSESSMENT & PLAN: A total of 50 minutes was spent with the patient and counseling/coordination of care regarding preparing for this visit, review of most recent office visit notes, review of most recent blood work results, review of most recent echo cardiogram and nuclear medicine report reviewed, review of all medications and changes made, cardiovascular risks associated with hypertension and diabetes, education on nutrition, smoking cessation advised, prognosis, documentation and need for follow-up in 3 months.  Hypertension associated with diabetes ([place]) Well-controlled hypertension.  Continue amlodipine 5 mg daily. BP Readings from Last 3 Encounters:  03/24/21 132/68  12/22/20 120/68  10/28/20 115/72  Hemoglobin A1c still not at target less than 7.  7.6 today. Morning blood sugars average 120 on average afternoon glucose 180-220. Will increase morning Tresiba to 25 units.  Premeal insulin per sliding scale. Continue metformin 1000 mg twice a day and 10 mg of glipizide in the morning. Hypoglycemia precautions  given. Diet and nutrition discussed. Follow-up in 3 months.   COPD (chronic obstructive pulmonary disease) (HCC) Stable and well-controlled.  Continue Symbicort twice a day and albuterol as needed.  Cigarette smoker Cardiovascular and cancer risks associated with smoking discussed. Smoking cessation advice given.  Nonspecific chest pain Stable vital signs and normal EKG.  Most likely musculoskeletal in origin. Recent echocardiogram and nuclear cardiac imaging reviewed. ED precautions given. Tramadol for pain as needed.  Navraj was seen today for diabetes.  Diagnoses and all orders for this visit:  Hypertension associated with diabetes ([place])  Need for influenza vaccination -     Flu Vaccine QUAD High Dose(Fluad)  Uncontrolled type 2 diabetes mellitus with hyperglycemia (HCC) -     POCT glycosylated hemoglobin (Hb A1C)  Nonspecific chest pain -     EKG 12-Lead  Pulmonary emphysema, unspecified emphysema type ([place]) -     For home use only DME Nebulizer machine  Musculoskeletal pain -     traMADol (ULTRAM) 50 MG tablet; Take 1 tablet (50 mg total) by mouth every 8 (eight) hours as needed for up to 5 days.  Type 2 diabetes mellitus with hyperglycemia, without long-term current use of insulin (HCC) -     insulin lispro (HUMALOG) 100 UNIT/ML KwikPen; Inject 3 Units into the skin 3 (three) times daily. Adjust amount of insulin as per sliding scale.  Cigarette smoker  Other orders -     pantoprazole (PROTONIX) 40 MG tablet; Take 1 tablet (40 mg total) by mouth daily. -     insulin degludec (TRESIBA) 100 UNIT/ML FlexTouch Pen; Inject 25 Units into the skin daily.  Patient Instructions  Diabetes mellitus y nutricin, en adultos Diabetes Mellitus and Nutrition, Adult Si sufre de diabetes, o diabetes mellitus, es muy importante tener hbitos alimenticios saludables debido a que sus niveles de Designer, television/film set sangre (glucosa) se ven afectados en gran medida por lo que come y bebe.  Comer alimentos saludables en las cantidades correctas, aproximadamente a la misma hora todos los [place], [place] ayudar a: Aeronautical engineer glucemia. Disminuir el riesgo de sufrir una enfermedad cardaca. Mejorar la presin arterial. Science writer o mantener un peso saludable. Qu puede afectar mi plan de alimentacin? Todas las personas que sufren de diabetes son diferentes y cada una tiene necesidades diferentes en cuanto a un plan de alimentacin. El mdico puede recomendarle que trabaje con un nutricionista para elaborar el mejor plan para usted. Su plan de alimentacin puede variar segn factores como: Las caloras que necesita. Los medicamentos que toma. Su peso. Sus niveles de glucemia, presin arterial y colesterol. Su nivel de [place]. Otras afecciones que tenga, como enfermedades cardacas o renales. Cmo me afectan los carbohidratos? Los carbohidratos, o hidratos de carbono, afectan su nivel de glucemia ms que cualquier otro tipo de alimento. La ingesta de carbohidratos naturalmente aumenta la cantidad de [employer]. El recuento de carbohidratos es un mtodo destinado a Catering manager un registro de la cantidad de carbohidratos que se consumen. El recuento de carbohidratos es importante para Theatre manager la glucemia a un nivel saludable, especialmente si utiliza insulina o toma determinados medicamentos por va oral para la diabetes. Es importante conocer la cantidad de carbohidratos que se pueden ingerir en cada comida sin correr Engineer, manufacturing. Esto es Psychologist, forensic. Su nutricionista puede ayudarlo a calcular la cantidad de carbohidratos que debe ingerir en cada comida y en cada refrigerio. Cmo me afecta el alcohol? El alcohol puede provocar disminuciones sbitas de la glucemia (hipoglucemia), especialmente si utiliza insulina o toma determinados medicamentos por va oral para la diabetes. La hipoglucemia es una afeccin potencialmente mortal. Los sntomas de la hipoglucemia, como  somnolencia, mareos y confusin, son similares a los sntomas de haber consumido demasiado alcohol. No beba alcohol si: Su mdico le indica no hacerlo. Est embarazada, puede estar embarazada o est tratando de [place]. Si bebe alcohol: No beba con el estmago vaco. Limite la cantidad que bebe: De 0 a 1 medida por da para las mujeres. De 0 a 2 medidas por da para los hombres. Est atento a la cantidad de alcohol que hay en las bebidas que toma. En los [place], una medida equivale a una botella de cerveza de 12 oz (355 ml), un vaso de vino de 5 oz (148 ml) o un vaso de una bebida alcohlica de alta graduacin de 1 oz (44 ml). Mantngase hidratado bebiendo agua, refrescos dietticos o t helado sin azcar. Tenga en cuenta que los refrescos comunes, los jugos y otras bebida para Optician, dispensing pueden contener mucha azcar y se deben contar como carbohidratos. Consejos para seguir Photographer las etiquetas de los alimentos Comience por leer el tamao de la porcin en la "Informacin nutricional" en las etiquetas de los alimentos envasados y las bebidas. La cantidad de caloras, carbohidratos, grasas y otros nutrientes mencionados en la etiqueta se basan en una porcin del alimento. Muchos alimentos contienen ms de una porcin por envase. Verifique la cantidad total de gramos (g) de carbohidratos totales en una porcin. Puede calcular la cantidad de porciones de carbohidratos al dividir el total de carbohidratos por 15. Por ejemplo, si un alimento tiene  un total de 30 g de carbohidratos totales por porcin, equivale a 2 porciones de carbohidratos. Verifique la cantidad de gramos (g) de grasas saturadas y grasas trans de una porcin. Escoja alimentos que no contengan estas grasas o que su contenido de estas sea [place]. Verifique la cantidad de miligramos (mg) de sal (sodio) en una porcin. La [employer] de las personas deben limitar la ingesta de sodio total a menos de 2300 mg [employer]. Siempre consulte la informacin nutricional de los alimentos etiquetados como "con bajo contenido de grasa" o "sin grasa". Estos alimentos pueden tener un mayor contenido de Location manager agregada o carbohidratos refinados, y deben evitarse. Hable con su nutricionista para identificar sus objetivos diarios en cuanto a los nutrientes mencionados en la etiqueta. Al ir de compras Evite comprar alimentos procesados, enlatados o precocidos. Estos alimentos tienden a Special educational needs teacher mayor cantidad de [place], sodio y azcar agregada. Compre en la zona exterior de la tienda de comestibles. Esta es la zona donde se encuentran con mayor frecuencia las frutas y las verduras frescas, los cereales a granel, las carnes frescas y los productos lcteos frescos. Al cocinar Utilice mtodos de coccin a baja temperatura, como hornear, en lugar de mtodos de coccin a alta temperatura, como frer en abundante aceite. Cocine con aceites saludables, como el aceite de [place], canola o [place]. Evite cocinar con manteca, crema o carnes con alto contenido de grasa. Planificacin de las comidas Coma las comidas y los refrigerios regularmente, preferentemente a la misma hora todos [place]. Evite pasar largos perodos de tiempo sin comer. Consuma alimentos ricos en fibra, como frutas frescas, verduras, frijoles y cereales integrales. Consulte a su nutricionista sobre cuntas porciones de carbohidratos puede consumir en cada comida. Consuma entre 4 y 6 onzas (entre 112 y 168 g) de protenas magras por da, como carnes [place], pollo, pescado, huevos o tofu. Una onza (oz) de protena magra equivale a: 1 onza (28 g) de carne, pollo o pescado. 1 huevo.  de taza (62 g) de tofu. Coma algunos alimentos por da que contengan grasas saludables, como aguacates, frutos secos, semillas y pescado. Qu alimentos debo comer? Lambert Mody Bayas. Manzanas. Naranjas. Duraznos. Damascos. Ciruelas. Uvas. Mango. Papaya. [place]. Kiwi.  Cerezas. [place] Valeda Malm. [place]. Verduras de [employer], que incluyen col rizada, [place], hojas de [place] y de [place]. Remolachas. Coliflor. Repollo. Brcoli. Zanahorias. Judas verdes. Tomates. Pimientos. Cebollas. Pepinos. Coles de Bruselas. Granos Granos integrales, como panes, galletas, tortillas, cereales y pastas de salvado o integrales. Avena sin azcar. Quinua. Arroz integral o salvaje. Carnes y Psychiatric nurse. Carne de ave sin piel. Cortes magros de ave y carne de res. Tofu. Frutos secos. Semillas. Lcteos Productos lcteos sin grasa o con bajo contenido de [place], [place], yogur y [place]. Es posible que los productos que se enumeran ms [place] no constituyan una lista completa de los alimentos y las bebidas que puede tomar. Consulte a un nutricionista para obtener ms informacin. Qu alimentos debo evitar? Lambert Mody Frutas enlatadas al almbar. Verduras Verduras enlatadas. Verduras congeladas con mantequilla o salsa de crema. Granos Productos elaborados con [place] y [place], como panes, pastas, bocadillos y cereales. Evite todos los alimentos procesados. Carnes y otras protenas Cortes de carne con alto contenido de Lobbyist. Carne de ave con piel. Carnes empanizadas o fritas. Carne procesada. Evite las grasas saturadas. Lcteos Yogur, [place] enteros. Bebidas Bebidas azucaradas, como gaseosas o t helado. Es posible que los productos que se enumeran ms [place] no constituyan Furniture conservator/restorer  de los alimentos y las bebidas que Nurse, adult. Consulte a un nutricionista para obtener ms informacin. Preguntas para hacerle al mdico Es necesario que me rena con Radio broadcast assistant en el cuidado de la diabetes? Es necesario que me rena con un nutricionista? A qu nmero puedo llamar si tengo preguntas? Cules son los mejores momentos para controlar la glucemia? Dnde encontrar ms informacin: Asociacin Estadounidense de la Diabetes (American  Diabetes Association): diabetes.org Academy of Nutrition and Dietetics (Academia de Nutricin y Information systems manager): www.eatright.Unisys Corporation of Diabetes and Digestive and Kidney Diseases ([place] la Diabetes y [place] y [place]): DesMoinesFuneral.dk Association of Diabetes Care and Education Specialists (Asociacin de Especialistas en Atencin y Educacin sobre la Diabetes): www.diabeteseducator.org Resumen Es importante tener hbitos alimenticios saludables debido a que sus niveles de Designer, television/film set sangre (glucosa) se ven afectados en gran medida por lo que come y bebe. Un plan de alimentacin saludable lo ayudar a controlar la glucemia y Theatre manager un estilo de vida saludable. El mdico puede recomendarle que trabaje con un nutricionista para elaborar el mejor plan para usted. Tenga en cuenta que los carbohidratos (hidratos de carbono) y el alcohol tienen efectos inmediatos en sus niveles de glucemia. Es importante contar los carbohidratos que ingiere y consumir alcohol con prudencia. Esta informacin no tiene Marine scientist el consejo del mdico. Asegrese de hacerle al mdico cualquier pregunta que tenga. Document Revised: 07/25/2019 Document Reviewed: 07/25/2019 Elsevier Patient Education  2021 [place], MD [facility]

## 2021-03-24 NOTE — Assessment & Plan Note (Signed)
Stable vital signs and normal EKG.  Most likely musculoskeletal in origin. Recent echocardiogram and nuclear cardiac imaging reviewed. ED precautions given. Tramadol for pain as needed.

## 2021-03-24 NOTE — Patient Instructions (Signed)
Diabetes mellitus y nutricin, en adultos Diabetes Mellitus and Nutrition, Adult Si sufre de diabetes, o diabetes mellitus, es muy importante tener hbitos alimenticios saludables debido a que sus niveles de Designer, television/film set sangre (glucosa) se ven afectados en gran medida por lo que come y bebe. Comer alimentos saludables en las cantidades correctas, aproximadamente a la misma hora todos los [place], [place] ayudar a: Aeronautical engineer glucemia. Disminuir el riesgo de sufrir una enfermedad cardaca. Mejorar la presin arterial. Science writer o mantener un peso saludable. Qu puede afectar mi plan de alimentacin? Todas las personas que sufren de diabetes son diferentes y cada una tiene necesidades diferentes en cuanto a un plan de alimentacin. El mdico puede recomendarle que trabaje con un nutricionista para elaborar el mejor plan para usted. Su plan de alimentacin puede variar segn factores como: Las caloras que necesita. Los medicamentos que toma. Su peso. Sus niveles de glucemia, presin arterial y colesterol. Su nivel de [place]. Otras afecciones que tenga, como enfermedades cardacas o renales. Cmo me afectan los carbohidratos? Los carbohidratos, o hidratos de carbono, afectan su nivel de glucemia ms que cualquier otro tipo de alimento. La ingesta de carbohidratos naturalmente aumenta la cantidad de [employer]. El recuento de carbohidratos es un mtodo destinado a Catering manager un registro de la cantidad de carbohidratos que se consumen. El recuento de carbohidratos es importante para Theatre manager la glucemia a un nivel saludable, especialmente si utiliza insulina o toma determinados medicamentos por va oral para la diabetes. Es importante conocer la cantidad de carbohidratos que se pueden ingerir en cada comida sin correr Engineer, manufacturing. Esto es Psychologist, forensic. Su nutricionista puede ayudarlo a calcular la cantidad de carbohidratos que debe ingerir en cada comida y en cada refrigerio. Cmo  me afecta el alcohol? El alcohol puede provocar disminuciones sbitas de la glucemia (hipoglucemia), especialmente si utiliza insulina o toma determinados medicamentos por va oral para la diabetes. La hipoglucemia es una afeccin potencialmente mortal. Los sntomas de la hipoglucemia, como somnolencia, mareos y confusin, son similares a los sntomas de haber consumido demasiado alcohol. No beba alcohol si: Su mdico le indica no hacerlo. Est embarazada, puede estar embarazada o est tratando de [place]. Si bebe alcohol: No beba con el estmago vaco. Limite la cantidad que bebe: De 0 a 1 medida por da para las mujeres. De 0 a 2 medidas por da para los hombres. Est atento a la cantidad de alcohol que hay en las bebidas que toma. En los [place], una medida equivale a una botella de cerveza de 12 oz (355 ml), un vaso de vino de 5 oz (148 ml) o un vaso de una bebida alcohlica de alta graduacin de 1 oz (44 ml). Mantngase hidratado bebiendo agua, refrescos dietticos o t helado sin azcar. Tenga en cuenta que los refrescos comunes, los jugos y otras bebida para Optician, dispensing pueden contener mucha azcar y se deben contar como carbohidratos. Consejos para seguir Photographer las etiquetas de los alimentos Comience por leer el tamao de la porcin en la "Informacin nutricional" en las etiquetas de los alimentos envasados y las bebidas. La cantidad de caloras, carbohidratos, grasas y otros nutrientes mencionados en la etiqueta se basan en una porcin del alimento. Muchos alimentos contienen ms de una porcin por envase. Verifique la cantidad total de gramos (g) de carbohidratos totales en una porcin. Puede calcular la cantidad de porciones de carbohidratos al dividir el total de carbohidratos por 15. Por ejemplo, si un alimento tiene un  total de 30 g de carbohidratos totales por porcin, equivale a 2 porciones de carbohidratos. Verifique la cantidad de gramos (g) de grasas  saturadas y grasas trans de una porcin. Escoja alimentos que no contengan estas grasas o que su contenido de estas sea [place]. Verifique la cantidad de miligramos (mg) de sal (sodio) en una porcin. [place] [employer] de las personas deben limitar la ingesta de sodio total a menos de 2300 mg [employer]well. Siempre consulte la informacin nutricional de los alimentos etiquetados como "con bajo contenido de grasa" o "sin grasa". Estos alimentos pueden tener un mayor contenido de Location manager agregada o carbohidratos refinados, y deben evitarse. Hable con su nutricionista para identificar sus objetivos diarios en cuanto a los nutrientes mencionados en la etiqueta. Al ir de compras Evite comprar alimentos procesados, enlatados o precocidos. Estos alimentos tienden a Special educational needs teacher mayor cantidad de [place], sodio y azcar agregada. Compre en la zona exterior de la tienda de comestibles. Esta es la zona donde se encuentran con mayor frecuencia las frutas y las verduras frescas, los cereales a granel, las carnes frescas y los productos lcteos frescos. Al cocinar Utilice mtodos de coccin a baja temperatura, como hornear, en lugar de mtodos de coccin a alta temperatura, como frer en abundante aceite. Cocine con aceites saludables, como el aceite de [place], canola o [place]. Evite cocinar con manteca, crema o carnes con alto contenido de grasa. Planificacin de las comidas Coma las comidas y los refrigerios regularmente, preferentemente a la misma hora todos [place]. Evite pasar largos perodos de tiempo sin comer. Consuma alimentos ricos en fibra, como frutas frescas, verduras, frijoles y cereales integrales. Consulte a su nutricionista sobre cuntas porciones de carbohidratos puede consumir en cada comida. Consuma entre 4 y 6 onzas (entre 112 y 168 g) de protenas magras por da, como carnes [place], pollo, pescado, huevos o tofu. Una onza (oz) de protena magra equivale a: 1 onza (28 g) de carne, pollo o pescado. 1 huevo.  de  taza (62 g) de tofu. Coma algunos alimentos por da que contengan grasas saludables, como aguacates, frutos secos, semillas y pescado. Qu alimentos debo comer? Lambert Mody Bayas. Manzanas. Naranjas. Duraznos. Damascos. Ciruelas. Uvas. Mango. Papaya. [place]. Kiwi. Cerezas. [place] Valeda Malm. Espinaca. Verduras de [employer], que incluyen col rizada, [place], hojas de [place] y de [place]. Remolachas. Coliflor. Repollo. Brcoli. Zanahorias. Judas verdes. Tomates. Pimientos. Cebollas. Pepinos. Coles de Bruselas. Granos Granos integrales, como panes, galletas, tortillas, cereales y pastas de salvado o integrales. Avena sin azcar. Quinua. Arroz integral o salvaje. Carnes y Psychiatric nurse. Carne de ave sin piel. Cortes magros de ave y carne de res. Tofu. Frutos secos. Semillas. Lcteos Productos lcteos sin grasa o con bajo contenido de [place], Gene Aut[place], yogur y Queets. Es posible que los productos que se enumeran ms [place] no constituyan una lista completa de los alimentos y las bebidas que puede tomar. Consulte a un nutricionista para obtener ms informacin. Qu alimentos debo evitar? Lambert Mody Frutas enlatadas al almbar. Verduras Verduras enlatadas. Verduras congeladas con mantequilla o salsa de crema. Granos Productos elaborados con [place] y [place], como panes, pastas, bocadillos y cereales. Evite todos los alimentos procesados. Carnes y otras protenas Cortes de carne con alto contenido de Lobbyist. Carne de ave con piel. Carnes empanizadas o fritas. Carne procesada. Evite las grasas saturadas. Lcteos Yogur, [place] enteros. Bebidas Bebidas azucaradas, como gaseosas o t helado. Es posible que los productos que se enumeran ms [place] no constituyan una lista [place] de  los alimentos y las bebidas que debe evitar. Consulte a un nutricionista para obtener ms informacin. Preguntas para hacerle al mdico Es necesario que me rena con Radio broadcast assistant en el cuidado  de la diabetes? Es necesario que me rena con un nutricionista? A qu nmero puedo llamar si tengo preguntas? Cules son los mejores momentos para controlar la glucemia? Dnde encontrar ms informacin: Asociacin Estadounidense de la Diabetes (American Diabetes Association): diabetes.org Academy of Nutrition and Dietetics (Academia de Nutricin y Information systems manager): www.eatright.Un[employer]ys Corporation of Diabetes and Digestive and Kidney Diseases ([place] la Diabetes y [place] y Renales): DesMoinesFuneral.dk Association of Diabetes Care and Education Specialists (Asociacin de Especialistas en Atencin y Educacin sobre la Diabetes): www.diabeteseducator.org Resumen Es importante tener hbitos alimenticios saludables debido a que sus niveles de Designer, television/film set sangre (glucosa) se ven afectados en gran medida por lo que come y bebe. Un plan de alimentacin saludable lo ayudar a controlar la glucemia y Theatre manager un estilo de vida saludable. El mdico puede recomendarle que trabaje con un nutricionista para elaborar el mejor plan para usted. Tenga en cuenta que los carbohidratos (hidratos de carbono) y el alcohol tienen efectos inmediatos en sus niveles de glucemia. Es importante contar los carbohidratos que ingiere y consumir alcohol con prudencia. Esta informacin no tiene Marine scientist el consejo del mdico. Asegrese de hacerle al mdico cualquier pregunta que tenga. Document Revised: 07/25/2019 Document Reviewed: 07/25/2019 Elsevier Patient Education  2021 [employer].

## 2021-03-24 NOTE — Assessment & Plan Note (Signed)
Well-controlled hypertension.  Continue amlodipine 5 mg daily. BP Readings from Last 3 Encounters:  03/24/21 132/68  12/22/20 120/68  10/28/20 115/72  Hemoglobin A1c still not at target less than 7.  7.6 today. Morning blood sugars average 120 on average afternoon glucose 180-220. Will increase morning Tresiba to 25 units.  Premeal insulin per sliding scale. Continue metformin 1000 mg twice a day and 10 mg of glipizide in the morning. Hypoglycemia precautions given. Diet and nutrition discussed. Follow-up in 3 months.

## 2021-03-24 NOTE — Assessment & Plan Note (Signed)
Stable and well-controlled.  Continue Symbicort twice a day and albuterol as needed.

## 2021-03-24 NOTE — Assessment & Plan Note (Signed)
Cardiovascular and cancer risks associated with smoking discussed. °Smoking cessation advice given. °

## 2021-03-25 ENCOUNTER — Emergency Department (HOSPITAL_COMMUNITY)
Admission: EM | Admit: 2021-03-25 | Discharge: 2021-03-25 | Disposition: A | Discharge status: Home / Self Care | Payer: Medicare HMO | Attending: Emergency Medicine | Admitting: Emergency Medicine

## 2021-03-25 ENCOUNTER — Encounter (HOSPITAL_COMMUNITY): Payer: Self-pay

## 2021-03-25 ENCOUNTER — Other Ambulatory Visit: Payer: Self-pay

## 2021-03-25 ENCOUNTER — Emergency Department (HOSPITAL_COMMUNITY): Payer: Medicare HMO

## 2021-03-25 DIAGNOSIS — E119 Type 2 diabetes mellitus without complications: Secondary | ICD-10-CM | POA: Insufficient documentation

## 2021-03-25 DIAGNOSIS — J441 Chronic obstructive pulmonary disease with (acute) exacerbation: Secondary | ICD-10-CM | POA: Diagnosis not present

## 2021-03-25 DIAGNOSIS — R109 Unspecified abdominal pain: Secondary | ICD-10-CM | POA: Insufficient documentation

## 2021-03-25 DIAGNOSIS — J323 Chronic sphenoidal sinusitis: Secondary | ICD-10-CM | POA: Diagnosis not present

## 2021-03-25 DIAGNOSIS — R200 Anesthesia of skin: Secondary | ICD-10-CM | POA: Diagnosis not present

## 2021-03-25 DIAGNOSIS — M79602 Pain in left arm: Secondary | ICD-10-CM | POA: Diagnosis not present

## 2021-03-25 DIAGNOSIS — Z7984 Long term (current) use of oral hypoglycemic drugs: Secondary | ICD-10-CM | POA: Insufficient documentation

## 2021-03-25 DIAGNOSIS — M542 Cervicalgia: Secondary | ICD-10-CM | POA: Insufficient documentation

## 2021-03-25 DIAGNOSIS — Z7951 Long term (current) use of inhaled steroids: Secondary | ICD-10-CM | POA: Insufficient documentation

## 2021-03-25 DIAGNOSIS — F17210 Nicotine dependence, cigarettes, uncomplicated: Secondary | ICD-10-CM | POA: Diagnosis not present

## 2021-03-25 DIAGNOSIS — R69 Illness, unspecified: Secondary | ICD-10-CM | POA: Diagnosis not present

## 2021-03-25 DIAGNOSIS — J9811 Atelectasis: Secondary | ICD-10-CM | POA: Diagnosis not present

## 2021-03-25 DIAGNOSIS — I2510 Atherosclerotic heart disease of native coronary artery without angina pectoris: Secondary | ICD-10-CM | POA: Insufficient documentation

## 2021-03-25 DIAGNOSIS — R0789 Other chest pain: Secondary | ICD-10-CM | POA: Diagnosis not present

## 2021-03-25 DIAGNOSIS — R079 Chest pain, unspecified: Secondary | ICD-10-CM | POA: Diagnosis not present

## 2021-03-25 DIAGNOSIS — Z79899 Other long term (current) drug therapy: Secondary | ICD-10-CM | POA: Diagnosis not present

## 2021-03-25 DIAGNOSIS — I10 Essential (primary) hypertension: Secondary | ICD-10-CM | POA: Insufficient documentation

## 2021-03-25 DIAGNOSIS — R519 Headache, unspecified: Secondary | ICD-10-CM | POA: Insufficient documentation

## 2021-03-25 DIAGNOSIS — M79609 Pain in unspecified limb: Secondary | ICD-10-CM

## 2021-03-25 DIAGNOSIS — M79605 Pain in left leg: Secondary | ICD-10-CM | POA: Insufficient documentation

## 2021-03-25 DIAGNOSIS — J449 Chronic obstructive pulmonary disease, unspecified: Secondary | ICD-10-CM

## 2021-03-25 LAB — CBC
HCT: 47.8 % (ref 39.0–52.0)
Hemoglobin: 16 g/dL (ref 13.0–17.0)
MCH: 31.3 pg (ref 26.0–34.0)
MCHC: 33.5 g/dL (ref 30.0–36.0)
MCV: 93.4 fL (ref 80.0–100.0)
Platelets: 248 10*3/uL (ref 150–400)
RBC: 5.12 MIL/uL (ref 4.22–5.81)
RDW: 12.8 % (ref 11.5–15.5)
WBC: 7.3 10*3/uL (ref 4.0–10.5)
nRBC: 0 % (ref 0.0–0.2)

## 2021-03-25 LAB — BASIC METABOLIC PANEL
Anion gap: 10 (ref 5–15)
BUN: 15 mg/dL (ref 8–23)
CO2: 24 mmol/L (ref 22–32)
Calcium: 9.6 mg/dL (ref 8.9–10.3)
Chloride: 102 mmol/L (ref 98–111)
Creatinine, Ser: 0.86 mg/dL (ref 0.61–1.24)
GFR, Estimated: >60 mL/min (ref >60)
Glucose, Bld: 155 mg/dL — ABNORMAL HIGH (ref 70–99)
Potassium: 4.8 mmol/L (ref 3.5–5.1)
Sodium: 136 mmol/L (ref 135–145)

## 2021-03-25 LAB — TROPONIN I (HIGH SENSITIVITY)
Troponin I (High Sensitivity): 3 ng/L (ref <18)
Troponin I (High Sensitivity): 4 ng/L (ref <18)

## 2021-03-25 LAB — D-DIMER, QUANTITATIVE: D-Dimer, Quant: 0.49 ug/mL-FEU (ref 0.00–0.50)

## 2021-03-25 MED ORDER — ALBUTEROL SULFATE (2.5 MG/3ML) 0.083% IN NEBU: INHALATION_SOLUTION | RESPIRATORY_TRACT | 3 refills | Status: DC

## 2021-03-25 MED ORDER — KETOROLAC TROMETHAMINE 15 MG/ML IJ SOLN
15.0000 mg | Freq: Once | INTRAMUSCULAR | Status: AC
  Administered 2021-03-25: 15 mg via INTRAVENOUS
  Filled 2021-03-25: qty 1

## 2021-03-25 MED ORDER — HYDROCODONE-ACETAMINOPHEN 5-325 MG PO TABS
1.0000 | ORAL_TABLET | Freq: Once | ORAL | Status: AC
  Administered 2021-03-25: 1 via ORAL
  Filled 2021-03-25: qty 1

## 2021-03-25 MED ORDER — IPRATROPIUM BROMIDE 0.02 % IN SOLN
0.5000 mg | Freq: Once | RESPIRATORY_TRACT | Status: AC
  Administered 2021-03-25: 0.5 mg via RESPIRATORY_TRACT
  Filled 2021-03-25: qty 2.5

## 2021-03-25 MED ORDER — CYCLOBENZAPRINE HCL 10 MG PO TABS
10.0000 mg | ORAL_TABLET | Freq: Once | ORAL | Status: AC
  Administered 2021-03-25: 10 mg via ORAL
  Filled 2021-03-25: qty 1

## 2021-03-25 MED ORDER — METHOCARBAMOL 500 MG PO TABS: 500.0000 mg | ORAL_TABLET | Freq: Two times a day (BID) | ORAL | 0 refills | Status: DC

## 2021-03-25 MED ORDER — ALBUTEROL SULFATE (2.5 MG/3ML) 0.083% IN NEBU
5.0000 mg | INHALATION_SOLUTION | Freq: Once | RESPIRATORY_TRACT | Status: AC
  Administered 2021-03-25: 5 mg via RESPIRATORY_TRACT
  Filled 2021-03-25: qty 6

## 2021-03-25 MED ORDER — METHYLPREDNISOLONE SODIUM SUCC 125 MG IJ SOLR
125.0000 mg | Freq: Once | INTRAMUSCULAR | Status: AC
  Administered 2021-03-25: 125 mg via INTRAVENOUS
  Filled 2021-03-25: qty 2

## 2021-03-25 MED ORDER — ALBUTEROL SULFATE HFA 108 (90 BASE) MCG/ACT IN AERS
1.0000 | INHALATION_SPRAY | Freq: Once | RESPIRATORY_TRACT | Status: AC
  Administered 2021-03-25: 1 via RESPIRATORY_TRACT
  Filled 2021-03-25: qty 6.7

## 2021-03-25 NOTE — Discharge Instructions (Addendum)
No sign of blood clots or problems with your heart today.  All your lab tests and x-rays looked okay.  No signs of stroke.  The pain you are experiencing is most likely in your muscles.  You can continue to take the medication your doctor gave you but you were also given a medication for muscle spasm.  If you start having severe shortness of breath, high fever, trouble walking return to the emergency room.

## 2021-03-25 NOTE — ED Triage Notes (Signed)
Pt reports chest pain and pain on left side from head to toe that began around 730a-8a this morning. Reports intermittent numbness to left leg.

## 2021-03-25 NOTE — ED Provider Notes (Signed)
[facility] DEPT Provider Note   CSN: [id number] Arrival date & time: 03/25/21  1315     History Chief Complaint  Patient presents with   Chest Pain    Duane Cuevas is a 65 y.o. male.  Patient is a 65 year old male with a history of COPD, diabetes, CAD, hyperlipidemia who is presenting today with complaints of pain from his head all the way down to his toes on the left.  It involves his arm, chest wall, left side of his abdomen and his leg.  He complains of numbness and tingling in the left hand and foot.  The pain is sharp and stabbing but does not radiate.  It is made worse with any deep breathing, movement of his arm or leg.  He reports having this pain in the past but it is never been this severe.  He does not know what has caused it in the past.  He did see his doctor yesterday for a routine appointment and then did discuss it with his doctor at that time.  He said yesterday the pain was not as bad and he was given tramadol by his doctor which has not helped with the pain and it was worse today.  He has also noticed worsening shortness of breath today and wheezing.  He is using his albuterol every day and is getting low and almost out.  He denies any fever, new cough or sputum.  He has sputum every day that is between white and yellow but feels that it is his baseline.  He stopped smoking approximately 2 months ago.  He has had no nausea vomiting or diarrhea.  He intermittently notices swelling in his legs but denies any recent surgeries or immobilization.  No specific swelling today in his left lower extremity.  He denies any trauma.  He is also complaining of a headache on the left side of his head that is made worse when he moves his head.  His vision is unchanged.  He also notes that he has a cyst on the back of his neck and that seems to make all of his pain worse.  Is been there approximately 3 years but in the last 36 hours it has been hurting and  causing pain to radiate up into his head and down into his arm.  The history is provided by the patient and medical records. The history is limited by a language barrier. A language interpreter was used.  Chest Pain     Past Medical History:  Diagnosis Date   Diabetes mellitus ([place]) 10/2014   pt denies being diabetic.    Emphysema of lung ([place])    Emphysema/COPD ([place]) 10/2014   Hepatic steatosis    Thrombocytopenia ([place]) 09/2015   platelets in 120s.     Patient Active Problem List   Diagnosis Date Noted   Hypertension associated with diabetes ([place]) 09/21/2020   Other dysphagia    Hyperglycemia 09/13/2020   Uncontrolled type 2 diabetes mellitus with hyperglycemia ([place]) 09/13/2020   CAD (coronary artery disease) 01/23/2020   Hyperlipidemia 11/02/2019   History of MI (myocardial infarction) 11/02/2019   Nonspecific chest pain 11/01/2019   Cigarette smoker 08/15/2018   COPD ? GOLD III/ active smoker 08/14/2018   History of diet-controlled diabetes 06/30/2018   COPD (chronic obstructive pulmonary disease) ([place]) 10/14/2014   Type 2 diabetes mellitus with hyperglycemia, without long-term current use of insulin ([place]) 10/14/2014    Past Surgical History:  Procedure Laterality Date  BIOPSY  09/16/2020   Procedure: BIOPSY;  Surgeon: Thornton Park, MD;  Location: [facility];  Service: Gastroenterology;;   ESOPHAGOGASTRODUODENOSCOPY N/A 09/23/2015   Procedure: ESOPHAGOGASTRODUODENOSCOPY (EGD);  Surgeon: Ladene Artist, MD;  Location: [facility];  Service: Endoscopy;  Laterality: N/A;   ESOPHAGOGASTRODUODENOSCOPY (EGD) WITH PROPOFOL N/A 09/16/2020   Procedure: ESOPHAGOGASTRODUODENOSCOPY (EGD) WITH PROPOFOL;  Surgeon: Thornton Park, MD;  Location: [facility];  Service: Gastroenterology;  Laterality: N/A;   FOOT SURGERY     INGUINAL HERNIA REPAIR Left 05/15/2018   Procedure: LEFT INGUINAL HERNIA REPAIR WITH MESH;  Surgeon: Erroll Luna, MD;  Location: [facility];  Service:  General;  Laterality: Left;   INSERTION OF MESH Left 05/15/2018   Procedure: INSERTION OF MESH;  Surgeon: Erroll Luna, MD;  Location: [facility];  Service: General;  Laterality: Left;   UPPER GASTROINTESTINAL ENDOSCOPY         Family History  Problem Relation Age of Onset   Emphysema Mother    Diabetes Mother    Emphysema Father    Cancer Sister        Stomach cancer   Diabetes Sister    Stomach cancer Sister    Diabetes Brother    Colon cancer Neg Hx    Colon polyps Neg Hx    Esophageal cancer Neg Hx    Rectal cancer Neg Hx     Social History   Tobacco Use   Smoking status: Every Day    Packs/day: 0.15    Years: 47.00    Pack years: 7.05    Types: Cigarettes   Smokeless tobacco: Never  Vaping Use   Vaping Use: Former  Substance Use Topics   Alcohol use: Yes    Comment: occ   Drug use: No    Home Medications Prior to Admission medications   Medication Sig Start Date End Date Taking? Authorizing Provider  methocarbamol (ROBAXIN) 500 MG tablet Take 1 tablet (500 mg total) by mouth 2 (two) times daily. 03/25/21  Yes Vega Stare, Loree Fee, MD  albuterol (PROVENTIL) (2.5 MG/3ML) 0.083% nebulizer solution USE 1 VIAL VIA NEBULIZER EVERY 6 HOURS AS NEEDED FOR WHEEZING OR SHORTNESS OF BREATH 03/25/21   Blanchie Dessert, MD  albuterol (VENTOLIN HFA) 108 (90 Base) MCG/ACT inhaler Inhale 2 puffs into the lungs every 6 (six) hours as needed for wheezing or shortness of breath. 12/22/20   [name], MD  amLODipine (NORVASC) 5 MG tablet Take 1 tablet (5 mg total) by mouth daily. 12/22/20   [name], MD  atorvastatin (LIPITOR) 20 MG tablet TAKE ONE TABLET BY MOUTH ONE TIME DAILY 11/16/20   [name], MD  benzonatate (TESSALON) 100 MG capsule Take 1 capsule (100 mg total) by mouth every 8 (eight) hours. Patient not taking: Reported on 03/24/2021 10/28/20   Deno Etienne, DO  blood glucose meter kit and supplies KIT Dispense based on patient and insurance  preference. Use up to four times daily as directed. 10/22/20   [name], MD  blood glucose meter kit and supplies Dispense based on patient and insurance preference. Use up to four times daily as directed. (FOR ICD-10 E10.9, E11.9). 10/24/20   [name], MD  budesonide-formoterol [facility]) 160-4.5 MCG/ACT inhaler Inhale 2 puffs into the lungs 2 (two) times daily. 06/22/20   [name], MD  glipiZIDE (GLUCOTROL) 10 MG tablet Take 1 tablet (10 mg total) by mouth 2 (two) times daily before a meal. Patient taking differently: Take 10 mg by mouth daily  before breakfast. Takes 1 more tablet if blood sugar is high 08/21/20   Maximiano Coss, NP  glucose blood (ACCU-CHEK GUIDE) test strip Use as instructed 10/22/20   [name], MD  guaiFENesin-dextromethorphan [facility] DM) 100-10 MG/5ML syrup Take 5 mLs by mouth every 4 (four) hours as needed for cough (chest congestion). Patient not taking: Reported on 03/24/2021 10/21/20   Pokhrel, Corrie Mckusick, MD  insulin degludec (TRESIBA) 100 UNIT/ML FlexTouch Pen Inject 25 Units into the skin daily. 03/24/21 06/22/21  [name], MD  insulin lispro (HUMALOG) 100 UNIT/ML KwikPen INJECT 3 UNITS INTO THE SKIN 3 TIMES DAILY AS DIRECTED. (DISCARD PEN AFTER 28 DAYS) 09/16/20   Dahal, Marlowe Aschoff, MD  insulin lispro (HUMALOG) 100 UNIT/ML KwikPen Inject 3 Units into the skin 3 (three) times daily. Adjust amount of insulin as per sliding scale. 03/24/21 06/22/21  [name], MD  Insulin Pen Needle 32G X 4 MM MISC USE TO INJECT LANTUS AND HUMALOG 4 TIMES DAILY 09/16/20 09/16/21  Terrilee Croak, MD  metFORMIN (GLUCOPHAGE) 1000 MG tablet Take 1 tablet (1,000 mg total) by mouth 2 (two) times daily with a meal. 06/22/20 09/20/20  [name], MD  ondansetron (ZOFRAN ODT) 4 MG disintegrating tablet 4mg  ODT q4 hours prn nausea/vomit 10/28/20   Deno Etienne, DO  pantoprazole (PROTONIX) 40 MG tablet Take 1 tablet (40 mg total) by  mouth daily. 03/24/21 06/22/21  [name], MD  traMADol (ULTRAM) 50 MG tablet Take 1 tablet (50 mg total) by mouth every 8 (eight) hours as needed for up to 5 days. 03/24/21 03/29/21  [name], MD    Allergies    Patient has no known allergies.  Review of Systems   Review of Systems  Cardiovascular:  Positive for chest pain.  All other systems reviewed and are negative.  Physical Exam Updated Vital Signs BP 120/75   Pulse 93   Temp 98.8 F (37.1 C) (Oral)   Resp 13   Ht 5\' 8"  (1.727 m)   Wt 76 kg   SpO2 95%   BMI 25.48 kg/m   Physical Exam Vitals and nursing note reviewed.  Constitutional:      General: He is not in acute distress.    Appearance: Normal appearance. He is well-developed and normal weight.  HENT:     Head: Normocephalic and atraumatic.     Mouth/Throat:     Mouth: Mucous membranes are moist.  Eyes:     Extraocular Movements: Extraocular movements intact.     Conjunctiva/sclera: Conjunctivae normal.     Pupils: Pupils are equal, round, and reactive to light.  Neck:     Comments: Tenderness with palpation to the left lateral neck.  No point tenderness over the cervical spine.  2 x 2 centimeter soft mobile lesion consistent with cyst or lipoma present that is nonerythematous, no fluctuance or induration.  It is tender with palpation. Cardiovascular:     Rate and Rhythm: Normal rate and regular rhythm.     Pulses: Normal pulses.     Heart sounds: Normal heart sounds. No murmur heard. Pulmonary:     Effort: Pulmonary effort is normal. No respiratory distress.     Breath sounds: Decreased air movement present. Wheezing present. No rales.     Comments: Tenderness with palpation over the sternum and left lateral chest wall. Chest:     Chest wall: Tenderness present.  Abdominal:     General: There is no distension.     Palpations: Abdomen  is soft.     Tenderness: There is abdominal tenderness in the left upper quadrant. There is no left  CVA tenderness, guarding or rebound.  Musculoskeletal:        General: Normal range of motion.     Cervical back: Normal range of motion and neck supple. Tenderness present.     Right lower leg: No edema.     Left lower leg: No edema.  Skin:    General: Skin is warm and dry.     Findings: No erythema or rash.  Neurological:     Mental Status: He is alert and oriented to person, place, and time. Mental status is at baseline.     Cranial Nerves: No cranial nerve deficit.     Motor: No weakness.     Comments: Subjective tingling to the left foot and hand  Psychiatric:        Mood and Affect: Mood normal.        Behavior: Behavior normal.    ED Results / Procedures / Treatments   Labs (all labs ordered are listed, but only abnormal results are displayed) Labs Reviewed  BASIC METABOLIC PANEL - Abnormal; Notable for the following components:      Result Value   Glucose, Bld 155 (*)    All other components within normal limits  CBC  D-DIMER, QUANTITATIVE  TROPONIN I (HIGH SENSITIVITY)  TROPONIN I (HIGH SENSITIVITY)    EKG EKG Interpretation  Date/Time:  Thursday March 25 2021 14:14:38 EDT Ventricular Rate:  85 PR Interval:  128 QRS Duration: 91 QT Interval:  358 QTC Calculation: 426 R Axis:   77 Text Interpretation: Sinus rhythm Left atrial enlargement Nonspecific T abnrm, anterolateral leads No significant change since last tracing Confirmed by Blanchie Dessert [phone]) on 03/25/2021 3:07:01 PM  Radiology DG Chest 2 View  Result Date: 03/25/2021 CLINICAL DATA:  Chest pain EXAM: CHEST - 2 VIEW COMPARISON:  Chest radiograph 10/28/2020 FINDINGS: The cardiomediastinal silhouette is normal. Minimal reticular opacities in the left base likely reflect subsegmental atelectasis. There is no focal consolidation or pulmonary edema. There is no pleural effusion or pneumothorax. There is no acute osseous abnormality. IMPRESSION: Minimal left basilar subsegmental atelectasis.  Otherwise, no radiographic evidence of acute cardiopulmonary process. Electronically Signed   By: Valetta Mole M.D.   On: 03/25/2021 17:02   CT HEAD WO CONTRAST (5MM)  Result Date: 03/25/2021 CLINICAL DATA:  Intermittent left leg numbness. EXAM: CT HEAD WITHOUT CONTRAST TECHNIQUE: Contiguous axial images were obtained from the base of the skull through the vertex without intravenous contrast. COMPARISON:  June 26, 2020 FINDINGS: Brain: No evidence of acute infarction, hemorrhage, hydrocephalus, extra-axial collection or mass lesion/mass effect. Vascular: No hyperdense vessel or unexpected calcification. Skull: Normal. Negative for fracture or focal lesion. Sinuses/Orbits: Mild sphenoid sinus mucosal thickening is seen. Other: None. IMPRESSION: 1. No acute intracranial abnormality. 2. Mild sphenoid sinus disease. Electronically Signed   By: Virgina Norfolk M.D.   On: 03/25/2021 17:56    Procedures Procedures   Medications Ordered in ED Medications  HYDROcodone-acetaminophen (NORCO/VICODIN) 5-325 MG per tablet 1 tablet (1 tablet Oral Given 03/25/21 1543)  cyclobenzaprine (FLEXERIL) tablet 10 mg (10 mg Oral Given 03/25/21 1543)  albuterol (PROVENTIL) (2.5 MG/3ML) 0.083% nebulizer solution 5 mg (5 mg Nebulization Given 03/25/21 1543)  ipratropium (ATROVENT) nebulizer solution 0.5 mg (0.5 mg Nebulization Given 03/25/21 1543)  methylPREDNISolone sodium succinate (SOLU-MEDROL) 125 mg/2 mL injection 125 mg (125 mg Intravenous Given 03/25/21 1543)  ketorolac (TORADOL) 15  MG/ML injection 15 mg (15 mg Intravenous Given 03/25/21 1839)  albuterol (VENTOLIN HFA) 108 (90 Base) MCG/ACT inhaler 1 puff (1 puff Inhalation Given 03/25/21 1840)    ED Course  I have reviewed the triage vital signs and the nursing notes.  Pertinent labs & imaging results that were available during my care of the patient were reviewed by me and considered in my medical decision making (see chart for details).    MDM  Rules/Calculators/A&P                           Elderly gentleman presenting with multiple complaints today.  His biggest complaint is of pain in the left side of his head, arm, chest and lower extremity.  This is worsened over the last day and a half.  He is also complaining of shortness of breath and is nearly out of his albuterol.  On exam patient is awake and alert.  He is mildly tachycardic with a pulse of 105 but otherwise has normal blood pressure and oxygen saturation.  His breath sounds are decreased and he does have wheezing most prominently on the right.  He denies any fever or change in his chronic cough.  He does report that he has had pain like this in the past but is never been so severe.  Seems unlikely to be stroke but he is complaining of numbness and tingling in his left hand and foot that is not present on the right.  We will do a head CT to ensure no acute pathology.  Also will do a D-dimer to ensure no concern for PE.  Patient has strong pulses on the left side that are equal to the right and normal blood pressure with lower suspicion for dissection at this time.  He did see his doctor yesterday when the pain was not as severe and they suspected this is most likely musculoskeletal which it may be but he is complaining of a lot of cramping so we will ensure no abnormal electrolytes as well.  Lower suspicion for pneumonia or COVID at this time.  Patient given pain control, albuterol, Atrovent, steroids. BMP today within normal limits with normal renal function and electrolytes, CBC within normal limits, troponin is normal at 3 and EKG showed no acute findings.  D-dimer is still pending as well as chest x-ray and head CT.  6:47 PM Patient's labs results are reassuring.  D-dimer is negative.  Delta troponin is normal.  Chest x-ray and head CT without acute findings.  On reevaluation patient was sleeping but is still having an occasional wheeze.  When awoken he reports he still having pain  and the medicine did not help much.  Unclear what is causing the pain may be musculoskeletal in nature.  Patient given a refill of his albuterol.  We will add a muscle relaxer to the medication he was given by his PCP yesterday.  Encouraged him to follow-up next week if you are still having problems.  MDM   Amount and/or Complexity of Data Reviewed Clinical lab tests: ordered and reviewed Tests in the radiology section of CPT: ordered and reviewed Tests in the medicine section of CPT: ordered and reviewed Independent visualization of images, tracings, or specimens: yes    Final Clinical Impression(s) / ED Diagnoses Final diagnoses:  Chest wall pain  Chronic obstructive pulmonary disease with acute exacerbation (HCC)  Acute extremity pain  Chronic obstructive pulmonary disease, unspecified COPD type (HCC)  Rx / DC Orders ED Discharge Orders          Ordered    albuterol (PROVENTIL) (2.5 MG/3ML) 0.083% nebulizer solution        03/25/21 1845    methocarbamol (ROBAXIN) 500 MG tablet  2 times daily        03/25/21 1845             Blanchie Dessert, MD 03/25/21 1847

## 2021-03-26 ENCOUNTER — Other Ambulatory Visit: Payer: Self-pay | Admitting: Emergency Medicine

## 2021-03-26 DIAGNOSIS — J449 Chronic obstructive pulmonary disease, unspecified: Secondary | ICD-10-CM

## 2021-04-05 ENCOUNTER — Telehealth: Payer: Self-pay | Admitting: Emergency Medicine

## 2021-04-05 NOTE — Telephone Encounter (Signed)
Yes we can change to generic brand, NovoLog, same dose.  Thanks.

## 2021-04-05 NOTE — Telephone Encounter (Signed)
Error

## 2021-04-05 NOTE — Telephone Encounter (Signed)
Triston from [employer] called on behalf of pt. And states that insurance will not cover "Humalog" and wants to know if provider can change prescription to generic brand, "Novolog"

## 2021-04-06 NOTE — Telephone Encounter (Signed)
Called and spoke with staff from [facility] pharmacy to change Humalog to Novolog so it could be covered under patient insurance.

## 2021-04-07 ENCOUNTER — Telehealth: Payer: Self-pay

## 2021-04-07 NOTE — Chronic Care Management (AMB) (Signed)
Chronic Care Management Pharmacy Assistant   Name: Berlin Mokry  MRN: [id number] DOB: 1955-09-10  Duane Cuevas is an 65 y.o. year old male who presents for his initial CCM visit with the clinical pharmacist.  Recent office visits:  03/24/21-Miguel Victorino December, MD (PCP) Diabetic follow up. Increase morning Tresiba to 25 units. EKG completed. Follow up in 6 months. 12/22/20-Miguel Victorino December, MD (PCP) Diabetic follow up. Will start corticosteroids.  Depo-Medrol shot in the office and Medrol Dosepak for the next several days. AMB Referral to [facility] Coordination. Follow up in 3 months. 10/22/20-Miguel Victorino December, MD (PCP) Hospital follow up. Blood glucose meter kit and supplies KIT.  Recent consult visits:  01/19/21-[facility]. Notes not available. 11/09/20-Richard Fabian Sharp (Ophthalmology) Notes not  available. 10/12/20-Paula Joana Reamer, NP (Gastroenterology) Hospital follow up. Morphine 2 mg injection given.  Hospital visits:  Medication Reconciliation was completed by comparing discharge summary, patient's EMR and Pharmacy list, and upon discussion with patient.  Admitted to the hospital on 03/25/21 due to chest pain. Discharge date was 03/25/21. Discharged from [facility].    New?Medications Started at [facility] Discharge:?? -started  Proventil 2.5mg /3 mL nebuliazer solution  Methocarbamol 500 mg tab twice daily  Medication Changes at Hospital Discharge: -Changed None noted  Medications Discontinued at Hospital Discharge: -Stopped None noted  Medications that remain the same after Hospital Discharge:??  -All other medications will remain the same.     Medication Reconciliation was completed by comparing discharge summary, patient's EMR and Pharmacy list, and upon discussion with patient.  Admitted to the hospital on 10/28/20 due to Shortness of breath. Discharge date was 10/28/20. Discharged from  [facility].    New?Medications Started at [facility] Discharge:?? -started  Prednisone 20 mg Tamiflu 75 mg evry 12 hours Zofran 4 mg  Tessalon 100 mg cap every 8 hours  Medication Changes at Hospital Discharge: -Changed none noted  Medications Discontinued at Hospital Discharge: -Stopped none noted  Medications that remain the same after Hospital Discharge:??  -All other medications will remain the same.    Medication Reconciliation was completed by comparing discharge summary, patient's EMR and Pharmacy list, and upon discussion with patient.  Admitted to the hospital on 10/20/20 due to chest pain . Discharge date was 10/21/20. Discharged from [facility].    New?Medications Started at [facility] Discharge:?? -started None noted  Medication Changes at Hospital Discharge: -Changed None noted  Medications Discontinued at Hospital Discharge: -Stopped None noted  Medications that remain the same after Hospital Discharge:??  -All other medications will remain the same.   Medications: Outpatient Encounter Medications as of 04/07/2021  Medication Sig Note   albuterol (PROVENTIL) (2.5 MG/3ML) 0.083% nebulizer solution USE 1 VIAL VIA NEBULIZER EVERY 6 HOURS AS NEEDED FOR WHEEZING OR SHORTNESS OF BREATH    albuterol (VENTOLIN HFA) 108 (90 Base) MCG/ACT inhaler Inhale 2 puffs into the lungs every 6 (six) hours as needed for wheezing or shortness of breath. 02/24/2021: 02/24/21: Patient reports today that he uses 3-4 times per day   amLODipine (NORVASC) 5 MG tablet Take 1 tablet (5 mg total) by mouth daily.    atorvastatin (LIPITOR) 20 MG tablet TAKE ONE TABLET BY MOUTH ONE TIME DAILY    benzonatate (TESSALON) 100 MG capsule Take 1 capsule (100 mg total) by mouth every 8 (eight) hours. (Patient not taking: Reported on 03/24/2021)    blood glucose meter kit and supplies KIT Dispense based on patient and insurance  preference. Use up to four times daily as directed. 12/22/2020:  use   blood glucose meter kit and supplies Dispense based on patient and insurance preference. Use up to four times daily as directed. (FOR ICD-10 E10.9, E11.9). 12/22/2020: use   budesonide-formoterol (SYMBICORT) 160-4.5 MCG/ACT inhaler Inhale 2 puffs into the lungs 2 (two) times daily.    glipiZIDE (GLUCOTROL) 10 MG tablet Take 1 tablet (10 mg total) by mouth 2 (two) times daily before a meal. (Patient taking differently: Take 10 mg by mouth daily before breakfast. Takes 1 more tablet if blood sugar is high)    glucose blood (ACCU-CHEK GUIDE) test strip Use as instructed 12/22/2020: use   guaiFENesin-dextromethorphan (ROBITUSSIN DM) 100-10 MG/5ML syrup Take 5 mLs by mouth every 4 (four) hours as needed for cough (chest congestion). (Patient not taking: Reported on 03/24/2021)    insulin degludec (TRESIBA) 100 UNIT/ML FlexTouch Pen Inject 25 Units into the skin daily.    insulin lispro (HUMALOG) 100 UNIT/ML KwikPen INJECT 3 UNITS INTO THE SKIN 3 TIMES DAILY AS DIRECTED. (DISCARD PEN AFTER 28 DAYS) 02/24/2021: 02/24/21: Appears to be duplicate order; patient reports taking as noted in duplicate order   insulin lispro (HUMALOG) 100 UNIT/ML KwikPen Inject 3 Units into the skin 3 (three) times daily. Adjust amount of insulin as per sliding scale.    Insulin Pen Needle 32G X 4 MM MISC USE TO INJECT LANTUS AND HUMALOG 4 TIMES DAILY 12/22/2020: use   metFORMIN (GLUCOPHAGE) 1000 MG tablet Take 1 tablet (1,000 mg total) by mouth 2 (two) times daily with a meal. 02/24/2021: 02/24/21: Reports taking as prescribed, takes in morning    methocarbamol (ROBAXIN) 500 MG tablet Take 1 tablet (500 mg total) by mouth 2 (two) times daily.    ondansetron (ZOFRAN ODT) 4 MG disintegrating tablet 4mg  ODT q4 hours prn nausea/vomit    pantoprazole (PROTONIX) 40 MG tablet Take 1 tablet (40 mg total) by mouth daily.    Facility-Administered Encounter Medications as of 04/07/2021  Medication   morphine 2 MG/ML injection   Albuterol  (PROVENTIL) (2.5 MG/3ML) 0.083% nebulizer solution Last filled:03/25/21 37 DS Albuterol (VENTOLIN HFA) 108 (90 Base) MCG/ACT inhaler Last filled:03/01/21 25 DS AmLODipine (NORVASC) 5 MG tablet Last filled:03/21/21 90 DS Atorvastatin (LIPITOR) 20 MG tablet Last filled:02/08/21 90 DS Benzonatate (TESSALON) 100 MG capsule Last filled:10/28/20 7 DS Budesonide-formoterol (SYMBICORT) 160-4.5 MCG/ACT inhaler Last filled:03/28/21 30 DS GlipiZIDE (GLUCOTROL) 10 MG tablet Last filled:02/10/21 90 DS GuaiFENesin-dextromethorphan (ROBITUSSIN DM) 100-10 MG/5ML syrup Last filled:None noted Insulin degludec (TRESIBA) 100 UNIT/ML FlexTouch Pen Last filled: 03/24/21 60 DS Insulin lispro (HUMALOG) 100 UNIT/ML KwikPen Last filled:11/04/20 0 DS MetFORMIN (GLUCOPHAGE) 1000 MG tablet (Expired) Last filled:03/28/21 90 DS Methocarbamol (ROBAXIN) 500 MG tablet Last filled:03/26/21 0 DS Ondansetron (ZOFRAN ODT) 4 MG disintegrating tablet Last filled:10/28/20 3 DS Pantoprazole (PROTONIX) 40 MG tablet Last filled:03/24/21 90 DS   Care Gaps: URINE MICROALBUMIN:Never done  Star Rating Drugs: Atorvastatin (LIPITOR) 20 MG tablet Last filled:02/08/21 90 DS GlipiZIDE (GLUCOTROL) 10 MG tablet Last filled:02/10/21 90 DS Insulin degludec (TRESIBA) 100 UNIT/ML FlexTouch Pen Last filled: 03/24/21 60 DS Insulin lispro (HUMALOG) 100 UNIT/ML KwikPen Last filled:11/04/20 0 DS MetFORMIN (GLUCOPHAGE) 1000 MG tablet (Expired) Last filled:03/28/21 90 DS  Myriam Elta [place], [place]

## 2021-04-09 ENCOUNTER — Ambulatory Visit (INDEPENDENT_AMBULATORY_CARE_PROVIDER_SITE_OTHER): Payer: Medicare HMO

## 2021-04-09 ENCOUNTER — Other Ambulatory Visit: Payer: Self-pay

## 2021-04-09 DIAGNOSIS — J439 Emphysema, unspecified: Secondary | ICD-10-CM

## 2021-04-09 DIAGNOSIS — E785 Hyperlipidemia, unspecified: Secondary | ICD-10-CM

## 2021-04-09 DIAGNOSIS — E1165 Type 2 diabetes mellitus with hyperglycemia: Secondary | ICD-10-CM

## 2021-04-09 DIAGNOSIS — I252 Old myocardial infarction: Secondary | ICD-10-CM

## 2021-04-09 DIAGNOSIS — I152 Hypertension secondary to endocrine disorders: Secondary | ICD-10-CM

## 2021-04-09 NOTE — Progress Notes (Signed)
Chronic Care Management Pharmacy Note  04/09/2021 Name:  Duane Cuevas MRN:  [id number] DOB:  [birth year]  Summary: - Patient reports that he has been checking blood sugars 3 times daily - typically averaging 110-120 in the AM, but rising throughout the day - 140-150 in afternoon and 200-230 at bedtime  -Patient reports to using tresiba 25 units daily, and 3 units of novolog with each meal, along with metformin 1056m BID and glipizde 532mdaily  - would benefit from CGM  -BP well controlled with most recent office visits, does admit to eating a high amount of sodium throughout the day - noted swelling in legs a few weeks ago which has since subsided  -Patient reports to consistent use of symbicort, despite adherence, is using albuterol HFA inhaler 6 times daily, and nebulizer 2-4 times daily  -LDL elevated from goal, patient reports adherence to statin medication -Order from [employer] has been submitted to [facility]  Recommendations/Changes made from today's visit: -Recommending for patient to start trulicity [id number] - will qualify for patient assistance, will starty lilly cares application  -Once trulicity is started will plan to stop novolog and reduce tresiba to 15 units daily, in the meantime while he is still injecting insulin 4 times daily will submit order for a CGM  -Recommending for increase in maintenance inhaler intensity to [facility] patient will qualify for assistance program - will submit application to [place] -Once breztri is received patient will stop symbicort and use breztri 2 puffs twice daily  -Advised for patient to increase activity as he is able, recommended sodium reduced diet (<230069maily) and also moderation in carb intake to better control his blood sugars  -Recommending for patient to increase atorvastatin to 66m54mily to bring LDL <70  Subjective: Duane Cuevas 65 y68. year old male who is a primary patient of Sagardia,  MiguInes Bloomer.  The CCM team was consulted for assistance with disease management and care coordination needs.    Engaged with patient face to face for initial visit in response to provider referral for pharmacy case management and/or care coordination services.   Consent to Services:  The patient was given the following information about Chronic Care Management services today, agreed to services, and gave verbal consent: 1. CCM service includes personalized support from designated clinical staff supervised by the primary care provider, including individualized plan of care and coordination with other care providers 2. 24/7 contact phone numbers for assistance for urgent and routine care needs. 3. Service will only be billed when office clinical staff spend 20 minutes or more in a month to coordinate care. 4. Only one practitioner may furnish and bill the service in a calendar month. 5.The patient may stop CCM services at any time (effective at the end of the month) by phone call to the office staff. 6. The patient will be responsible for cost sharing (co-pay) of up to 20% of the service fee (after annual deductible is met). Patient agreed to services and consent obtained.  Patient Care Team: SagaHorald Pollen as PCP - General (Internal Medicine) TousKnox Royalty as Case Manager Ivan Maskell, DaniDarnelle MaffucciH [facility] HospitalPharmacist (Pharmacist)  Recent Office Visits: 03/24/21-Duane Cuevas (PCP) Diabetic follow up. Increase morning Tresiba to 25 units. EKG completed. Follow up in 6 months. 12/22/20-Duane Cuevas (PCP) Diabetic follow up. Will start corticosteroids.  Depo-Medrol shot in the office and Medrol Dosepak for the next several  days. AMB Referral to [facility]. Follow up in 3 months. 10/22/20-Duane Duane Jersey, MD (PCP) Hospital follow up. Blood glucose meter kit and supplies KIT.   Recent consult visits:  01/19/21-[facility] [place] [facility]. Notes not available. 11/09/20-Richard Wyatt Portela (Ophthalmology) Notes not  available. 10/12/20-Paula Gerald Stabs, NP (Gastroenterology) Hospital follow up. Morphine 2 mg injection given.   Hospital visits:  Medication Reconciliation was completed by comparing discharge summary, patient's EMR and Pharmacy list, and upon discussion with patient.   Admitted to the hospital on 03/25/21 due to chest pain. Discharge date was 03/25/21. Discharged from [place] Started at [facility] Discharge:?? -started  Proventil 2.90m/3 mL nebuliazer solution  Methocarbamol 500 mg tab twice daily   Medication Changes at Hospital Discharge: -Changed None noted   Medications Discontinued at Hospital Discharge: -Stopped None noted   Medications that remain the same after Hospital Discharge:??  -All other medications will remain the same.       Medication Reconciliation was completed by comparing discharge summary, patient's EMR and Pharmacy list, and upon discussion with patient.   Admitted to the hospital on 10/28/20 due to Shortness of breath - COPD exacerbation / Influenza A infection . Discharge date was 10/28/20. [facility]  Prednisone 20 mg Tamiflu 75 mg evry 12 hours Zofran 4 mg  Tessalon 100 mg cap every 8 hours   Medication Changes at Hospital Discharge: -Changed none noted   Medications Discontinued at Hospital Discharge: -Stopped none noted   Medications that remain the same after Hospital Discharge:??  -All other medications will remain the same.      Medication Reconciliation was completed by comparing discharge summary, patient's EMR and Pharmacy list, and upon discussion with patient.   Admitted to the hospital on 10/20/20 due to COPD exacerbation. Discharge date was 10/21/20. [facility] None noted   Medication Changes at Hospital Discharge: -Changed None noted   Medications Discontinued at Hospital Discharge: -Stopped None noted   Medications that remain the same after Hospital Discharge:??  -All other medications will remain the same.   Objective:  Lab Results  Component Value Date   CREATININE 0.86 03/25/2021   BUN 15 03/25/2021   GFRNONAA >60 03/25/2021   GFRAA 101 06/22/2020   NA 136 03/25/2021   K 4.8 03/25/2021   CALCIUM 9.6 03/25/2021   CO2 24 03/25/2021   GLUCOSE 155 (H) 03/25/2021    Lab Results  Component Value Date/Time   HGBA1C 7.6 (A) 03/24/2021 09:59 AM   HGBA1C 7.5 (A) 12/22/2020 10:14 AM   HGBA1C 8.9 (H) 04/28/2020 01:00 AM   HGBA1C 6.8 (H) 11/02/2019 05:58 AM    Last diabetic Eye exam:  Lab Results  Component Value Date/Time   HMDIABEYEEXA No Retinopathy 11/09/2020 02:50 PM    Last diabetic Foot exam:  No results found for: HMDIABFOOTEX   Lab Results  Component Value Date   CHOL 172 06/22/2020   HDL 48 06/22/2020   LDLCALC 102 (H) 06/22/2020   TRIG 126 06/22/2020   CHOLHDL 3.6 06/22/2020    Hepatic Function Latest Ref Rng & Units 10/21/2020 09/14/2020 09/10/2020  Total Protein 6.5 - 8.1 g/dL 6.9 6.7 7.7  Albumin 3.5 - 5.0 g/dL 3.6 3.9 4.3  AST 15 - 41 U/L _0 ALT 0 - 44  U/L 22 44 39  Alk Phosphatase 38 - 126 U/L 66 56 70  Total Bilirubin 0.3 - 1.2 mg/dL 0.9 0.6 0.4  Bilirubin, Direct 0.0 - 0.2 mg/dL - <0.1 <0.1    No results found for: TSH, FREET4  CBC Latest Ref Rng & Units 03/25/2021 10/28/2020 10/21/2020  WBC 4.0 - 10.5 K/uL 7.3 13.9(H) 9.3  Hemoglobin 13.0 - 17.0 g/dL 16.0 13.1 12.8(L)  Hematocrit 39.0 - 52.0 % 47.8 39.5 38.3(L)  Platelets 150 - 400 K/uL 248 245 237    No results found for: VD25OH  Clinical ASCVD: Yes  The 10-year ASCVD risk score (Arnett DK, et al., 2019) is: 29.9%   Values used to calculate the score:     Age: 39 years     Sex: Male     Is Non-Hispanic  African American: No     Diabetic: Yes     Tobacco smoker: Yes     Systolic Blood Pressure: 818 mmHg     Is BP treated: Yes     HDL Cholesterol: 48 mg/dL     Total Cholesterol: 172 mg/dL    Depression screen [facility] 2/9 03/24/2021 01/14/2021 12/22/2020  Decreased Interest 0 0 0  Down, Depressed, Hopeless 0 0 0  PHQ - 2 Score 0 0 0   Social History   Tobacco Use  Smoking Status Every Day   Packs/day: 0.15   Years: 47.00   Pack years: 7.05   Types: Cigarettes  Smokeless Tobacco Never   BP Readings from Last 3 Encounters:  03/25/21 120/75  03/24/21 132/68  12/22/20 120/68   Pulse Readings from Last 3 Encounters:  03/25/21 93  03/24/21 100  12/22/20 (!) 106   Wt Readings from Last 3 Encounters:  03/25/21 167 lb 8.8 oz (76 kg)  03/24/21 169 lb (76.7 kg)  12/22/20 167 lb 6.4 oz (75.9 kg)   BMI Readings from Last 3 Encounters:  03/25/21 25.48 kg/m  03/24/21 25.70 kg/m  12/22/20 25.45 kg/m    Assessment/Interventions: Review of patient past medical history, allergies, medications, health status, including review of consultants reports, laboratory and other test data, was performed as part of comprehensive evaluation and provision of chronic care management services.   SDOH:  (Social Determinants of Health) assessments and interventions performed: Yes  SDOH Screenings   Alcohol Screen: Not on file  Depression (PHQ2-9): Low Risk    PHQ-2 Score: 0  Financial Resource Strain: High Risk   Difficulty of Paying Living Expenses: Hard  Food Insecurity: Food Insecurity Present   Worried About Charity fundraiser in the Last Year: Sometimes true   Ran Out of Food in the Last Year: Sometimes true  Housing: Medium Risk   Last Housing Risk Score: 1  Physical Activity: Not on file  Social Connections: Not on file  Stress: Not on file  Tobacco Use: High Risk   Smoking Tobacco Use: Every Day   Smokeless Tobacco Use: Never  Transportation Needs: No Transportation Needs   Lack of  Transportation (Medical): No   Lack of Transportation (Non-Medical): No    CCM Care Plan  No Known Allergies  Medications Reviewed Today     Reviewed by [name], MD (Physician) on 03/24/21 at 1320  Med List Status: <None>   Medication Order Taking? Sig Documenting Provider Last Dose Status Informant  albuterol (PROVENTIL) (2.5 MG/3ML) 0.083% nebulizer solution [id number] No USE 1 VIAL VIA NEBULIZER EVERY 6 HOURS AS NEEDED FOR WHEEZING OR SHORTNESS OF BREATH  Patient  not taking: No sig reported   [name], MD Not Taking Active   albuterol (VENTOLIN HFA) 108 (90 Base) MCG/ACT inhaler [id number] Yes Inhale 2 puffs into the lungs every 6 (six) hours as needed for wheezing or shortness of breath. [name], MD Taking Active            Med Note Marzetta Merino Feb 24, 2021  9:17 AM) 02/24/21: Patient reports today that he uses 3-4 times per day  amLODipine (NORVASC) 5 MG tablet [id number] Yes Take 1 tablet (5 mg total) by mouth daily. [name], MD Taking Active   atorvastatin (LIPITOR) 20 MG tablet [id number] Yes TAKE ONE TABLET BY MOUTH ONE TIME DAILY [name], MD Taking Active   benzonatate (TESSALON) 100 MG capsule [id number] No Take 1 capsule (100 mg total) by mouth every 8 (eight) hours.  Patient not taking: Reported on 03/24/2021   Deno Etienne, DO Not Taking Active   blood glucose meter kit and supplies [id number] Yes Dispense based on patient and insurance preference. Use up to four times daily as directed. (FOR ICD-10 E10.9, E11.9). [name], MD Taking Active Multiple Informants           Med Note Blanch Media, CYNTHIA A   Tue Dec 22, 2020 10:03 AM) use  blood glucose meter kit and supplies KIT [id number] Yes Dispense based on patient and insurance preference. Use up to four times daily as directed. [name], MD Taking Active Multiple Informants           Med Note Blanch Media, CYNTHIA A   Tue Dec 22, 2020  10:03 AM) use  budesonide-formoterol [facility]) 160-4.5 MCG/ACT inhaler [id number] Yes Inhale 2 puffs into the lungs 2 (two) times daily. [name], MD Taking Active Multiple Informants           Med Note Maud Deed   Sun Sep 13, 2020  8:38 PM)    glipiZIDE (GLUCOTROL) 10 MG tablet [id number] Yes Take 1 tablet (10 mg total) by mouth 2 (two) times daily before a meal.  Patient taking differently: Take 10 mg by mouth daily before breakfast. Takes 1 more tablet if blood sugar is high   Maximiano Coss, NP Taking Active   glucose blood (ACCU-CHEK GUIDE) test strip [id number] Yes Use as instructed [name], MD Taking Active Multiple Informants           Med Note Blanch Media, CYNTHIA A   Tue Dec 22, 2020 10:03 AM) use  guaiFENesin-dextromethorphan (ROBITUSSIN DM) 100-10 MG/5ML syrup [id number] No Take 5 mLs by mouth every 4 (four) hours as needed for cough (chest congestion).  Patient not taking: Reported on 03/24/2021   Flora Lipps, MD Not Taking Active   insulin degludec (TRESIBA) 100 UNIT/ML FlexTouch Pen [id number]  Inject 25 Units into the skin daily. [name], MD  Active   insulin lispro (HUMALOG) 100 UNIT/ML KwikPen [id number] Yes INJECT 3 UNITS INTO THE SKIN 3 TIMES DAILY AS DIRECTED. (DISCARD PEN AFTER 28 DAYS) Dahal, Marlowe Aschoff, MD Taking Active            Med Note Knox Royalty   Wed Feb 24, 2021  9:20 AM) 02/24/21: Vita Barley to be duplicate order; patient reports taking as noted in duplicate order  insulin lispro (HUMALOG) 100 UNIT/ML KwikPen [id number]  Inject 3 Units into the skin 3 (three) times daily. Adjust amount of insulin as per sliding scale. [name], MD  Active  Insulin Pen Needle 32G X 4 MM MISC [id number] Yes USE TO INJECT LANTUS AND HUMALOG 4 TIMES DAILY Dahal, Marlowe Aschoff, MD Taking Active Multiple Informants           Med Note Blanch Media, CYNTHIA A   Tue Dec 22, 2020 10:04 AM) use  metFORMIN (GLUCOPHAGE) 1000 MG tablet [id number]  Take 1  tablet (1,000 mg total) by mouth 2 (two) times daily with a meal. [name], MD  Expired 09/20/20 2359 Self           Med Note Knox Royalty   Wed Feb 24, 2021  9:44 AM) 02/24/21: Reports taking as prescribed, takes in morning   morphine 2 MG/ML injection [id number]     Active   ondansetron (ZOFRAN ODT) 4 MG disintegrating tablet [id number] Yes 64m ODT q4 hours prn nausea/vomit FDeno Etienne DO Taking Active   pantoprazole (PROTONIX) 40 MG tablet [id number] Take 1 tablet (40 mg total) by mouth daily. SHorald Pollen MD  Active   traMADol (Veatrice Bourbon 50 MG tablet [id number] Take 1 tablet (50 mg total) by mouth every 8 (eight) hours as needed for up to 5 days. SHorald Pollen MD  Active             Patient Active Problem List   Diagnosis Date Noted   Hypertension associated with diabetes ([place] 09/21/2020   Other dysphagia    Hyperglycemia 09/13/2020   Uncontrolled type 2 diabetes mellitus with hyperglycemia ([place] 09/13/2020   CAD (coronary artery disease) 01/23/2020   Hyperlipidemia 11/02/2019   History of MI (myocardial infarction) 11/02/2019   Nonspecific chest pain 11/01/2019   Cigarette smoker 08/15/2018   COPD ? GOLD III/ active smoker 08/14/2018   History of diet-controlled diabetes 06/30/2018   COPD (chronic obstructive pulmonary disease) (HG[place] 10/14/2014   Type 2 diabetes mellitus with hyperglycemia, without long-term current use of insulin (HFort [place] 10/14/2014    Immunization History  Administered Date(s) Administered   Fluad Quad(high Dose 65+) 03/24/2021   Influenza,inj,Quad PF,6+ Mos 09/21/2015, 07/02/2018   Influenza-Unspecified 08/03/2020   PFIZER(Purple Top)SARS-COV-2 Vaccination 09/29/2019, 06/12/2020, 10/20/2020   Pneumococcal Conjugate-13 09/21/2020   Pneumococcal Polysaccharide-23 10/15/2014, 09/21/2015    Conditions to be addressed/monitored:  Hypertension, Hyperlipidemia, Diabetes, GERD, and COPD  Care Plan : [place] Updates  made by STomasa Blase RWest Citysince 04/09/2021 12:00 AM     Problem: HTN, CAD, HLD, COPD, DM2, GERD   Priority: High  Onset Date: 04/09/2021     Long-Range Goal: Disease Management   Start Date: 04/09/2021  Expected End Date: 10/08/2021  This Visit's Progress: On track  Priority: High  Note:    Current Barriers:  Unable to independently afford treatment regimen Unable to independently monitor therapeutic efficacy Unable to maintain control of DM and COPD  Pharmacist Clinical Goal(s):  Patient will verbalize ability to afford treatment regimen achieve adherence to monitoring guidelines and medication adherence to achieve therapeutic efficacy achieve improvement in COPD and DM as evidenced by decreased rescue inhaler use / more stable BG through collaboration with PharmD and provider.   Interventions: 1:1 collaboration with SHorald Pollen MD regarding development and update of comprehensive plan of care as evidenced by provider attestation and co-signature Inter-disciplinary care team collaboration (see longitudinal plan of care) Comprehensive medication review performed; medication list updated in electronic medical record  Hypertension (BP goal <130/80) -Controlled -Current treatment: Amlodipine 550m- 1 tablet daily  -Medications previously tried: n/a  -Current home readings: n/a BP  Readings from Last 3 Encounters:  03/25/21 120/75  03/24/21 132/68  12/22/20 120/68  -Current dietary habits: notes to adding salt to his food, does not watch sodium intake  -Current exercise habits: walks daily - mostly around/ inside the house  -Denies hypotensive/hypertensive symptoms -Educated on BP goals and benefits of medications for prevention of heart attack, stroke and kidney damage; Daily salt intake goal < 2300 mg; Exercise goal of 150 minutes per week; Importance of home blood pressure monitoring; Proper BP monitoring technique; -Counseled on diet and exercise  extensively Recommended to continue current medication  Hyperlipidemia: (LDL goal < 70) -Not ideally controlled Lab Results  Component Value Date   LDLCALC 102 (H) 06/22/2020  -Current treatment: Atorvastatin 9m - 1 tablet daily  -Medications previously tried: n/a  -Current dietary patterns: fried foods on occasion - usually will eat fried chicken with rice and beans  -Current exercise habits: walks daily - mostly around/ inside the house  -Educated on Cholesterol goals;  Benefits of statin for ASCVD risk reduction; Importance of limiting foods high in cholesterol; Exercise goal of 150 minutes per week; -Counseled on diet and exercise extensively Recommended for patient to increase atorvastatin to 472mdaily to help further decrease LDL  Diabetes (A1c goal <7%) -Not ideally controlled Lab Results  Component Value Date   HGBA1C 7.6 (A) 03/24/2021  -Current medications: Glipizide 1051m 1 tablet daily  Metformin 1000m68m1 tablet twice daily  Tresiba - 25 units daily  Novolog-3 units 3 times daily  -Medications previously tried: lantus  -Current home glucose readings fasting glucose: AM 110-120, noon - 140-150, nighttime 200-230 -Reports hypoglycemic/hyperglycemic symptoms - occasionally can have overnight hypoglycemia - has dropped as low as 36  - corrected by drinking soda and eating a sandwich  -Current meal patterns:  breakfast: oatmeal, small sandwich, coffee  lunch: vegetables with fish  dinner: rice/ beans wit meat  snacks: small sandwich, plantains, fruit  drinks: diet soda  -Current exercise: walks daily - mostly around/ inside the house  -Educated on A1c and blood sugar goals; Complications of diabetes including kidney damage, retinal damage, and cardiovascular disease; Exercise goal of 150 minutes per week; Benefits of weight loss; Proper insulin injection technique; Prevention and management of hypoglycemic episodes; Benefits of routine self-monitoring of  blood sugar; Continuous glucose monitoring; Carbohydrate counting and/or plate method -Counseled to check feet daily and get yearly eye exams -Counseled on diet and exercise extensively Recommended for patient to start trulicity 0.758.85OYkly - once medication is started would recommend for patient to hold novolog and decrease tresiba to 15 units daily  -Confirmed with patient no family history of thyroid cancer / no previous history of pancreatitis  COPD (Goal: control symptoms and prevent exacerbations) -Not ideally controlled -Current treatment  Symbicort 160-4.5mcg56mt - 2 puffs twice daily  Albuterol 108mcg70m - 2 puffs every 4 hours as needed  - using every 4 hours  Albuterol 0.0083% nebulizer solution - 1 vial every 6 hours as needed - using 2-4 times daily  -Medications previously tried: n/a  -Gold Grade: Gold 3 (FEV1 30-49%) -Current COPD Classification:  D (high sx, >/=2 exacerbations/yr) -MMRC/CAT score: not availabel -Pulmonary function testing: 08/14/2018 -Exacerbations requiring treatment in last 6 months: 2 -Patient reports consistent use of maintenance inhaler -Frequency of rescue inhaler use: 6 times daily, nebulizer use 2-4 times daily  -Counseled on Proper inhaler technique; Benefits of consistent maintenance inhaler use When to use rescue inhaler Differences between maintenance and rescue inhalers -Recommended for  increase in maintenance intensity - recommending breztri - 2 puffs twice daily   GERD (Goal: Prevention / control of acid reflux) -Controlled -Current treatment  Pantoprazole 73m - 1 tablet daily  -Medications previously tried: n/a  -Counseled on diet and exercise extensively Recommended to continue current medication  Patient Goals/Self-Care Activities Patient will:  - take medications as prescribed check glucose at least 3 times daily, document, and provide at future appointments collaborate with provider on medication access solutions target  a minimum of 150 minutes of moderate intensity exercise weekly engage in dietary modifications by reducing sodium intake / moderation of carbohydrate intake  Follow Up Plan: Telephone follow up appointment with care management team member scheduled for: 1 month The patient has been provided with contact information for the care management team and has been advised to call with any health related questions or concerns.         Medication Assistance: Application for trulicty and breztri  medication assistance program. in process.  Anticipated assistance start date 05/10/2021.  See plan of care for additional detail.  Patient's preferred pharmacy is:  [facility] [address]Phone: [phone]: [phone] [facility] [address]Phone: [phone]: [phone] [facility] [address]Phone: [phone]: [phone]  Uses pill box? Yes Pt endorses 100% compliance  Care Plan and Follow Up Patient Decision:  Patient agrees to Care Plan and Follow-up.  Plan: Telephone follow up appointment with care management team member scheduled for:  1 month and The patient has been provided with contact information for the care management team and has been advised to call with any health related questions or concerns.   DTomasa Blase PharmD Clinical Pharmacist, [place]

## 2021-04-09 NOTE — Patient Instructions (Signed)
Duane Cuevas,  It was great to talk to you today!  Please call me with any questions or concerns.  Visit Information   PATIENT GOALS:   Goals Addressed             This Visit's Progress    Set My Target A1C-Diabetes Type 2       Timeframe:  Long-Range Goal Priority:  High Start Date:  04/09/2021                           Expected End Date:  10/08/2021                     Follow Up Date 05/10/2021   - set target A1C <7.0% - attain a more stable blood pressure throughout the day     Why is this important?   Your target A1C is decided together by you and your doctor.  It is based on several things like your age and other health issues.     Track and Manage My Triggers-COPD       Timeframe:  Long-Range Goal Priority:  High Start Date:  04/09/2021                           Expected End Date:  10/08/2021                     Follow Up Date 05/10/2021   - avoid second hand smoke - eliminate smoking in my home - identify and avoid work-related triggers - identify and remove indoor air pollutants - limit outdoor activity during cold weather    Why is this important?   Triggers are activities or things, like tobacco smoke or cold weather, that make your COPD (chronic obstructive pulmonary disease) flare-up.  Knowing these triggers helps you plan how to stay away from them.  When you cannot remove them, you can learn how to manage them.          Consent to CCM Services: Duane Cuevas was given information about Chronic Care Management services including:  CCM service includes personalized support from designated clinical staff supervised by his physician, including individualized plan of care and coordination with other care providers 24/7 contact phone numbers for assistance for urgent and routine care needs. Service will only be billed when office clinical staff spend 20 minutes or more in a month to coordinate care. Only one practitioner may furnish and bill the  service in a calendar month. The patient may stop CCM services at any time (effective at the end of the month) by phone call to the office staff. The patient will be responsible for cost sharing (co-pay) of up to 20% of the service fee (after annual deductible is met).  Patient agreed to services and verbal consent obtained.   Patient verbalizes understanding of instructions provided today and agrees to view in [place].   Telephone follow up appointment with care management team member scheduled for: The patient has been provided with contact information for the care management team and has been advised to call with any health related questions or concerns.   Tomasa Blase, PharmD Clinical Pharmacist, [place]

## 2021-04-13 ENCOUNTER — Telehealth: Payer: Self-pay

## 2021-04-13 NOTE — Telephone Encounter (Signed)
PA started for insulin lispro (HUMALOG) 100 UNIT/ML KwikPen  (Key: [id number])

## 2021-04-14 ENCOUNTER — Emergency Department (HOSPITAL_COMMUNITY): Payer: Medicare HMO

## 2021-04-14 ENCOUNTER — Other Ambulatory Visit: Payer: Self-pay

## 2021-04-14 ENCOUNTER — Emergency Department (HOSPITAL_COMMUNITY)
Admission: EM | Admit: 2021-04-14 | Discharge: 2021-04-14 | Disposition: A | Discharge status: Home / Self Care | Payer: Medicare HMO | Attending: Emergency Medicine | Admitting: Emergency Medicine

## 2021-04-14 ENCOUNTER — Encounter (HOSPITAL_COMMUNITY): Payer: Self-pay | Admitting: Emergency Medicine

## 2021-04-14 DIAGNOSIS — Z7951 Long term (current) use of inhaled steroids: Secondary | ICD-10-CM | POA: Diagnosis not present

## 2021-04-14 DIAGNOSIS — Z7984 Long term (current) use of oral hypoglycemic drugs: Secondary | ICD-10-CM | POA: Insufficient documentation

## 2021-04-14 DIAGNOSIS — I10 Essential (primary) hypertension: Secondary | ICD-10-CM | POA: Diagnosis not present

## 2021-04-14 DIAGNOSIS — R69 Illness, unspecified: Secondary | ICD-10-CM | POA: Diagnosis not present

## 2021-04-14 DIAGNOSIS — I2510 Atherosclerotic heart disease of native coronary artery without angina pectoris: Secondary | ICD-10-CM | POA: Insufficient documentation

## 2021-04-14 DIAGNOSIS — R059 Cough, unspecified: Secondary | ICD-10-CM | POA: Diagnosis not present

## 2021-04-14 DIAGNOSIS — J431 Panlobular emphysema: Secondary | ICD-10-CM

## 2021-04-14 DIAGNOSIS — E1169 Type 2 diabetes mellitus with other specified complication: Secondary | ICD-10-CM | POA: Diagnosis not present

## 2021-04-14 DIAGNOSIS — Z79899 Other long term (current) drug therapy: Secondary | ICD-10-CM | POA: Insufficient documentation

## 2021-04-14 DIAGNOSIS — R9431 Abnormal electrocardiogram [ECG] [EKG]: Secondary | ICD-10-CM | POA: Diagnosis not present

## 2021-04-14 DIAGNOSIS — R079 Chest pain, unspecified: Secondary | ICD-10-CM | POA: Diagnosis not present

## 2021-04-14 DIAGNOSIS — F17210 Nicotine dependence, cigarettes, uncomplicated: Secondary | ICD-10-CM | POA: Insufficient documentation

## 2021-04-14 DIAGNOSIS — J441 Chronic obstructive pulmonary disease with (acute) exacerbation: Secondary | ICD-10-CM

## 2021-04-14 DIAGNOSIS — Z794 Long term (current) use of insulin: Secondary | ICD-10-CM | POA: Insufficient documentation

## 2021-04-14 DIAGNOSIS — R000 Tachycardia, unspecified: Secondary | ICD-10-CM | POA: Insufficient documentation

## 2021-04-14 LAB — BASIC METABOLIC PANEL
Anion gap: 12 (ref 5–15)
BUN: 10 mg/dL (ref 8–23)
CO2: 24 mmol/L (ref 22–32)
Calcium: 9.6 mg/dL (ref 8.9–10.3)
Chloride: 103 mmol/L (ref 98–111)
Creatinine, Ser: 0.67 mg/dL (ref 0.61–1.24)
GFR, Estimated: >60 mL/min (ref >60)
Glucose, Bld: 144 mg/dL — ABNORMAL HIGH (ref 70–99)
Potassium: 3.6 mmol/L (ref 3.5–5.1)
Sodium: 139 mmol/L (ref 135–145)

## 2021-04-14 LAB — TROPONIN I (HIGH SENSITIVITY): Troponin I (High Sensitivity): 4 ng/L (ref <18)

## 2021-04-14 LAB — CBC
HCT: 44.5 % (ref 39.0–52.0)
Hemoglobin: 15 g/dL (ref 13.0–17.0)
MCH: 30.7 pg (ref 26.0–34.0)
MCHC: 33.7 g/dL (ref 30.0–36.0)
MCV: 91 fL (ref 80.0–100.0)
Platelets: 255 10*3/uL (ref 150–400)
RBC: 4.89 MIL/uL (ref 4.22–5.81)
RDW: 12.5 % (ref 11.5–15.5)
WBC: 8.5 10*3/uL (ref 4.0–10.5)
nRBC: 0 % (ref 0.0–0.2)

## 2021-04-14 MED ORDER — IPRATROPIUM BROMIDE 0.02 % IN SOLN
0.5000 mg | Freq: Once | RESPIRATORY_TRACT | Status: AC
  Administered 2021-04-14: 0.5 mg via RESPIRATORY_TRACT
  Filled 2021-04-14: qty 2.5

## 2021-04-14 MED ORDER — PREDNISONE 10 MG PO TABS: ORAL_TABLET | ORAL | 0 refills | Status: AC

## 2021-04-14 MED ORDER — ALBUTEROL SULFATE HFA 108 (90 BASE) MCG/ACT IN AERS: 2.0000 | INHALATION_SPRAY | Freq: Four times a day (QID) | RESPIRATORY_TRACT | 3 refills | Status: DC | PRN

## 2021-04-14 MED ORDER — PREDNISONE 20 MG PO TABS
60.0000 mg | ORAL_TABLET | Freq: Once | ORAL | Status: AC
  Administered 2021-04-14: 60 mg via ORAL
  Filled 2021-04-14: qty 3

## 2021-04-14 MED ORDER — BENZONATATE 100 MG PO CAPS: 100.0000 mg | ORAL_CAPSULE | Freq: Three times a day (TID) | ORAL | 0 refills | Status: DC

## 2021-04-14 MED ORDER — ALBUTEROL SULFATE (2.5 MG/3ML) 0.083% IN NEBU
5.0000 mg | INHALATION_SOLUTION | Freq: Once | RESPIRATORY_TRACT | Status: AC
  Administered 2021-04-14: 5 mg via RESPIRATORY_TRACT
  Filled 2021-04-14: qty 6

## 2021-04-14 MED ORDER — ACETAMINOPHEN 325 MG PO TABS
650.0000 mg | ORAL_TABLET | Freq: Once | ORAL | Status: AC
  Administered 2021-04-14: 650 mg via ORAL
  Filled 2021-04-14: qty 2

## 2021-04-14 NOTE — ED Provider Notes (Signed)
[facility] DEPT Provider Note   CSN: [id number] Arrival date & time: 04/14/21  1516     History Chief Complaint  Patient presents with   Cough    Heinrich Fertig is a 65 y.o. male.   Cough   Pt has been coughing day and night for the past three days.  He has been coughing constantly.  He has been feeling tight in the chest and having a hard time breathing.  He has been coughing up yellow sputum. He has been taking his inhalers.  They have not helped.  He has not seen his doctor for this. No fevers.  No leg swelling. No difficulty speaking.  No sore throat.  Pt has had issues like this in the past with his copd.  Tessalon has often helped.  He quit smoking several months ago.  Past Medical History:  Diagnosis Date   Diabetes mellitus ([place]) 10/2014   pt denies being diabetic.    Emphysema of lung ([place])    Emphysema/COPD ([place]) 10/2014   Hepatic steatosis    Thrombocytopenia ([place]) 09/2015   platelets in 120s.     Patient Active Problem List   Diagnosis Date Noted   Hypertension associated with diabetes ([place]) 09/21/2020   Other dysphagia    Hyperglycemia 09/13/2020   Uncontrolled type 2 diabetes mellitus with hyperglycemia ([place]) 09/13/2020   CAD (coronary artery disease) 01/23/2020   Hyperlipidemia 11/02/2019   History of MI (myocardial infarction) 11/02/2019   Nonspecific chest pain 11/01/2019   Cigarette smoker 08/15/2018   COPD ? GOLD III/ active smoker 08/14/2018   History of diet-controlled diabetes 06/30/2018   COPD (chronic obstructive pulmonary disease) ([place]) 10/14/2014   Type 2 diabetes mellitus with hyperglycemia, without long-term current use of insulin ([place]) 10/14/2014    Past Surgical History:  Procedure Laterality Date   BIOPSY  09/16/2020   Procedure: BIOPSY;  Surgeon: Thornton Park, MD;  Location: [facility];  Service: Gastroenterology;;   ESOPHAGOGASTRODUODENOSCOPY N/A 09/23/2015   Procedure:  ESOPHAGOGASTRODUODENOSCOPY (EGD);  Surgeon: Ladene Artist, MD;  Location: [facility];  Service: Endoscopy;  Laterality: N/A;   ESOPHAGOGASTRODUODENOSCOPY (EGD) WITH PROPOFOL N/A 09/16/2020   Procedure: ESOPHAGOGASTRODUODENOSCOPY (EGD) WITH PROPOFOL;  Surgeon: Thornton Park, MD;  Location: [facility];  Service: Gastroenterology;  Laterality: N/A;   FOOT SURGERY     INGUINAL HERNIA REPAIR Left 05/15/2018   Procedure: LEFT INGUINAL HERNIA REPAIR WITH MESH;  Surgeon: Erroll Luna, MD;  Location: [facility];  Service: General;  Laterality: Left;   INSERTION OF MESH Left 05/15/2018   Procedure: INSERTION OF MESH;  Surgeon: Erroll Luna, MD;  Location: [facility];  Service: General;  Laterality: Left;   UPPER GASTROINTESTINAL ENDOSCOPY         Family History  Problem Relation Age of Onset   Emphysema Mother    Diabetes Mother    Emphysema Father    Cancer Sister        Stomach cancer   Diabetes Sister    Stomach cancer Sister    Diabetes Brother    Colon cancer Neg Hx    Colon polyps Neg Hx    Esophageal cancer Neg Hx    Rectal cancer Neg Hx     Social History   Tobacco Use   Smoking status: Every Day    Packs/day: 0.15    Years: 47.00    Pack years: 7.05    Types: Cigarettes   Smokeless tobacco: Never  Vaping Use   Vaping Use: Former  Substance Use Topics   Alcohol use: Yes    Comment: occ   Drug use: No    Home Medications Prior to Admission medications   Medication Sig Start Date End Date Taking? Authorizing Provider  predniSONE (DELTASONE) 10 MG tablet Take 5 tablets (50 mg total) by mouth daily with breakfast for 2 days, THEN 4 tablets (40 mg total) daily with breakfast for 2 days, THEN 3 tablets (30 mg total) daily with breakfast for 2 days, THEN 2 tablets (20 mg total) daily with breakfast for 2 days, THEN 1 tablet (10 mg total) daily with breakfast for 2 days. 04/14/21 04/24/21 Yes Dorie Rank, MD  albuterol (PROVENTIL) (2.5 MG/3ML) 0.083% nebulizer solution USE  1 VIAL VIA NEBULIZER EVERY 6 HOURS AS NEEDED FOR WHEEZING OR SHORTNESS OF BREATH 03/27/21   [name], MD  albuterol (VENTOLIN HFA) 108 (90 Base) MCG/ACT inhaler Inhale 2 puffs into the lungs every 6 (six) hours as needed for wheezing or shortness of breath. 04/14/21   Dorie Rank, MD  amLODipine (NORVASC) 5 MG tablet Take 1 tablet (5 mg total) by mouth daily. 12/22/20   [name], MD  atorvastatin (LIPITOR) 20 MG tablet TAKE ONE TABLET BY MOUTH ONE TIME DAILY 11/16/20   [name], MD  benzonatate (TESSALON) 100 MG capsule Take 1 capsule (100 mg total) by mouth every 8 (eight) hours. 04/14/21   Dorie Rank, MD  blood glucose meter kit and supplies KIT Dispense based on patient and insurance preference. Use up to four times daily as directed. 10/22/20   [name], MD  blood glucose meter kit and supplies Dispense based on patient and insurance preference. Use up to four times daily as directed. (FOR ICD-10 E10.9, E11.9). 10/24/20   [name], MD  budesonide-formoterol [facility]) 160-4.5 MCG/ACT inhaler Inhale 2 puffs into the lungs 2 (two) times daily. 06/22/20   [name], MD  glipiZIDE (GLUCOTROL) 10 MG tablet Take 1 tablet (10 mg total) by mouth 2 (two) times daily before a meal. Patient taking differently: Take 5 mg by mouth daily before breakfast. Takes 1 more tablet if blood sugar is high 08/21/20   Maximiano Coss, NP  glucose blood (ACCU-CHEK GUIDE) test strip Use as instructed 10/22/20   [name], MD  insulin degludec (TRESIBA) 100 UNIT/ML FlexTouch Pen Inject 25 Units into the skin daily. 03/24/21 06/22/21  [name], MD  insulin lispro (HUMALOG) 100 UNIT/ML KwikPen Inject 3 Units into the skin 3 (three) times daily. Adjust amount of insulin as per sliding scale. 03/24/21 06/22/21  [name], MD  Insulin Pen Needle 32G X 4 MM MISC USE TO INJECT LANTUS AND HUMALOG 4 TIMES DAILY 09/16/20 09/16/21   Terrilee Croak, MD  metFORMIN (GLUCOPHAGE) 1000 MG tablet Take 1 tablet (1,000 mg total) by mouth 2 (two) times daily with a meal. 06/22/20 09/20/20  Sagardia, Ines Bloomer, MD  methocarbamol (ROBAXIN) 500 MG tablet Take 1 tablet (500 mg total) by mouth 2 (two) times daily. Patient not taking: Reported on 04/09/2021 03/25/21   Blanchie Dessert, MD  ondansetron (ZOFRAN ODT) 4 MG disintegrating tablet 4mg  ODT q4 hours prn nausea/vomit Patient not taking: Reported on 04/09/2021 10/28/20   Deno Etienne, DO  pantoprazole (PROTONIX) 40 MG tablet Take 1 tablet (40 mg total) by mouth daily. 03/24/21 06/22/21  [name], MD    Allergies    Patient has no known allergies.  Review of Systems   Review of Systems  Respiratory:  Positive for cough.   All other systems reviewed and are negative.  Physical Exam Updated Vital Signs BP 113/77   Pulse 100   Temp 98.3 F (36.8 C) (Oral)   Resp 18   SpO2 98%   Physical Exam Vitals and nursing note reviewed.  Constitutional:      General: He is not in acute distress.    Appearance: He is well-developed.  HENT:     Head: Normocephalic and atraumatic.     Right Ear: External ear normal.     Left Ear: External ear normal.  Eyes:     General: No scleral icterus.       Right eye: No discharge.        Left eye: No discharge.     Conjunctiva/sclera: Conjunctivae normal.  Neck:     Trachea: No tracheal deviation.  Cardiovascular:     Rate and Rhythm: Normal rate and regular rhythm.  Pulmonary:     Effort: Pulmonary effort is normal. No respiratory distress.     Breath sounds: No stridor. Wheezing present. No rales.  Abdominal:     General: Bowel sounds are normal. There is no distension.     Palpations: Abdomen is soft.     Tenderness: There is no abdominal tenderness. There is no guarding or rebound.  Musculoskeletal:        General: No tenderness or deformity.     Cervical back: Neck supple.  Skin:    General: Skin is warm and dry.      Findings: No rash.  Neurological:     General: No focal deficit present.     Mental Status: He is alert.     Cranial Nerves: No cranial nerve deficit (no facial droop, extraocular movements intact, no slurred speech).     Sensory: No sensory deficit.     Motor: No abnormal muscle tone or seizure activity.     Coordination: Coordination normal.  Psychiatric:        Mood and Affect: Mood normal.    ED Results / Procedures / Treatments   Labs (all labs ordered are listed, but only abnormal results are displayed) Labs Reviewed  BASIC METABOLIC PANEL - Abnormal; Notable for the following components:      Result Value   Glucose, Bld 144 (*)    All other components within normal limits  CBC  TROPONIN I (HIGH SENSITIVITY)    EKG EKG Interpretation  Date/Time:  Wednesday April 14 2021 15:44:50 EDT Ventricular Rate:  102 PR Interval:  120 QRS Duration: 96 QT Interval:  361 QTC Calculation: 471 R Axis:   65 Text Interpretation: Sinus tachycardia Left atrial enlargement Low voltage, extremity leads Baseline wander in lead(s) I aVL Since last tracing rate faster Confirmed by Dorie Rank [phone]) on 04/14/2021 7:35:49 PM  Radiology DG Chest 2 View  Result Date: 04/14/2021 CLINICAL DATA:  Chest pain, coughing EXAM: CHEST - 2 VIEW COMPARISON:  Chest radiograph 03/25/2021 FINDINGS: The cardiomediastinal silhouette is stable. There is no focal consolidation or pulmonary edema. There is no pleural effusion or pneumothorax. There is no acute osseous abnormality. IMPRESSION: No radiographic evidence of acute cardiopulmonary process. Electronically Signed   By: Valetta Mole M.D.   On: 04/14/2021 16:54    Procedures Procedures   Medications Ordered in ED Medications  albuterol (PROVENTIL) (2.5 MG/3ML) 0.083% nebulizer solution 5 mg (5 mg Nebulization Given 04/14/21 2000)  ipratropium (ATROVENT) nebulizer solution 0.5 mg (0.5 mg Nebulization Given 04/14/21 2001)  predniSONE (  DELTASONE)  tablet 60 mg (60 mg Oral Given 04/14/21 2001)  acetaminophen (TYLENOL) tablet 650 mg (650 mg Oral Given 04/14/21 2000)    ED Course  I have reviewed the triage vital signs and the nursing notes.  Pertinent labs & imaging results that were available during my care of the patient were reviewed by me and considered in my medical decision making (see chart for details).  Clinical Course as of 04/14/21 2111  Wed Apr 14, 2021  1933 CBC and BMET troponin normal. [JK]  1933 CXR negative [JK]  2104 Patient states he is feeling better.  Wheezing has decreased [JK]    Clinical Course User Index [JK] Dorie Rank, MD   MDM Rules/Calculators/A&P                           Patient presented to ED for evaluation of shortness of breath and wheezing.  Patient does have a history COPD.  No longer smokes.  On exam he was noted to have wheezing.  Patient's ED work-up is otherwise reassuring.  No signs of cardiac ischemia.  CBC and metabolic unremarkable.  Chest x-ray does not show pneumonia or pneumothorax.  He was treated with steroids and breathing treatments.  Symptoms have improved.  We will plan on outpatient treatment for COPD exacerbation Spanish language translator was used during the patient's evaluation Final Clinical Impression(s) / ED Diagnoses Final diagnoses:  COPD with acute exacerbation ([place])  Panlobular emphysema ([place])    Rx / DC Orders ED Discharge Orders          Ordered    benzonatate (TESSALON) 100 MG capsule  Every 8 hours,   Status:  Discontinued        04/14/21 1938    albuterol (VENTOLIN HFA) 108 (90 Base) MCG/ACT inhaler  Every 6 hours PRN        04/14/21 2109    benzonatate (TESSALON) 100 MG capsule  Every 8 hours        04/14/21 2109    predniSONE (DELTASONE) 10 MG tablet        04/14/21 2109             Dorie Rank, MD 04/14/21 2111

## 2021-04-14 NOTE — ED Provider Notes (Signed)
Emergency Medicine Provider Triage Evaluation Note  Duane Cuevas , a 65 y.o. male  was evaluated in triage.  Pt complains of 3 days of cough and chest pain.  He states that he does not smoke.  He denies any fevers or sick contacts.  His chest only hurts when he coughs.  Review of Systems  Positive: Cough, chest pain Negative: N/V/D  Physical Exam  BP 116/83   Pulse (!) 102   Temp 98.3 F (36.8 C) (Oral)   Resp 18   SpO2 94%  Gen:   Awake, no distress   Resp:  Normal effort, frequent coughing, wheezes and rhonchi bilaterally. MSK:   Moves extremities without difficulty  Other:  Normal gait, able to ambulate without significant difficulty.  Medical Decision Making  Medically screening exam initiated at 4:06 PM.  Appropriate orders placed.  Duane Cuevas was informed that the remainder of the evaluation will be completed by another provider, this initial triage assessment does not replace that evaluation, and the importance of remaining in the ED until their evaluation is complete.  Patient has significant rhonchi and wheezes bilaterally.  He also has chest pain.  Will obtain labs, chest x-ray, and EKG.  Note: Portions of this report may have been transcribed using voice recognition software. Every effort was made to ensure accuracy; however, inadvertent computerized transcription errors may be present    Duane Cuevas 04/14/21 1607    Dorie Rank, MD 04/14/21 1907

## 2021-04-14 NOTE — Discharge Instructions (Signed)
Take medications as prescribed

## 2021-04-14 NOTE — ED Triage Notes (Signed)
Per pt, states coughing which is causing CP for 3 days-some blood tinged sputum-OTC meds not working

## 2021-04-15 ENCOUNTER — Other Ambulatory Visit: Payer: Self-pay

## 2021-04-15 ENCOUNTER — Other Ambulatory Visit: Payer: Self-pay | Admitting: Internal Medicine

## 2021-04-15 ENCOUNTER — Telehealth: Payer: Self-pay | Admitting: Emergency Medicine

## 2021-04-15 DIAGNOSIS — J431 Panlobular emphysema: Secondary | ICD-10-CM

## 2021-04-15 DIAGNOSIS — J449 Chronic obstructive pulmonary disease, unspecified: Secondary | ICD-10-CM

## 2021-04-15 MED ORDER — ALBUTEROL SULFATE HFA 108 (90 BASE) MCG/ACT IN AERS: 2.0000 | INHALATION_SPRAY | Freq: Four times a day (QID) | RESPIRATORY_TRACT | 3 refills | Status: DC | PRN

## 2021-04-15 MED ORDER — ALBUTEROL SULFATE (2.5 MG/3ML) 0.083% IN NEBU: INHALATION_SOLUTION | RESPIRATORY_TRACT | 3 refills | Status: DC

## 2021-04-15 NOTE — Telephone Encounter (Signed)
Refilled nebulizer solution.

## 2021-04-15 NOTE — Telephone Encounter (Signed)
Pharmacist called to check on status of refill for albuterol (PROVENTIL) (2.5 MG/3ML) 0.083% nebulizer solution. The order was printed and the patient missed placed it. Requesting a new order be sent.

## 2021-04-16 ENCOUNTER — Other Ambulatory Visit (HOSPITAL_COMMUNITY): Payer: Self-pay

## 2021-04-19 ENCOUNTER — Other Ambulatory Visit (HOSPITAL_COMMUNITY): Payer: Self-pay

## 2021-04-22 ENCOUNTER — Other Ambulatory Visit (HOSPITAL_COMMUNITY): Payer: Self-pay

## 2021-04-27 ENCOUNTER — Other Ambulatory Visit (HOSPITAL_COMMUNITY): Payer: Self-pay

## 2021-04-27 ENCOUNTER — Other Ambulatory Visit: Payer: Self-pay

## 2021-04-28 ENCOUNTER — Other Ambulatory Visit (HOSPITAL_COMMUNITY): Payer: Self-pay

## 2021-04-30 ENCOUNTER — Other Ambulatory Visit: Payer: Self-pay

## 2021-04-30 ENCOUNTER — Other Ambulatory Visit (HOSPITAL_COMMUNITY): Payer: Self-pay

## 2021-05-03 DIAGNOSIS — E1165 Type 2 diabetes mellitus with hyperglycemia: Secondary | ICD-10-CM | POA: Diagnosis not present

## 2021-05-03 DIAGNOSIS — J439 Emphysema, unspecified: Secondary | ICD-10-CM

## 2021-05-03 DIAGNOSIS — E1159 Type 2 diabetes mellitus with other circulatory complications: Secondary | ICD-10-CM

## 2021-05-03 DIAGNOSIS — I152 Hypertension secondary to endocrine disorders: Secondary | ICD-10-CM

## 2021-05-03 DIAGNOSIS — E785 Hyperlipidemia, unspecified: Secondary | ICD-10-CM

## 2021-05-03 DIAGNOSIS — I252 Old myocardial infarction: Secondary | ICD-10-CM

## 2021-05-04 ENCOUNTER — Other Ambulatory Visit (HOSPITAL_COMMUNITY): Payer: Self-pay

## 2021-05-11 ENCOUNTER — Other Ambulatory Visit: Payer: Self-pay | Admitting: Emergency Medicine

## 2021-05-11 DIAGNOSIS — E1165 Type 2 diabetes mellitus with hyperglycemia: Secondary | ICD-10-CM

## 2021-05-13 ENCOUNTER — Ambulatory Visit (INDEPENDENT_AMBULATORY_CARE_PROVIDER_SITE_OTHER): Payer: Medicare HMO

## 2021-05-13 NOTE — Progress Notes (Signed)
Chronic Care Management Pharmacy Note  05/13/2021 Name:  Duane Cuevas MRN:  [id number] DOB:  [birth year]  Summary: - spoke with patient via translator service, patient confirms that he has received trulicity, but reports that he has not yet started as he was unsure of insulin dosing once he started trulicity  -Patient reports that blood sugars have been stable, this AM was 130, but rising in the PM -Cecilia application has not yet been approved, will update patient once he is approved, patient will replace his current maintenance inhaler with breztri once started  -Patient was approved for Elenor Legato but due to cost was unable to start   Recommendations/Changes made from today's visit: -Recommending for patient to start trulicity 4.82LM weekly - once started patient will stop novolog/humalog, patient to decrease tresiba to 10 units daily once trulicity is started -Reviewed with patient signs/symptoms of hypoglycemia and how to properly correct, should BG average <100 patient will reach out to discuss with office -Will update patient on breztri application once approved -will revisit previous plan for increased atorvastatin dosing with next visit   Subjective: Duane Cuevas is an 65 y.o. year old male who is a primary patient of Sagardia, Ines Bloomer, MD.  The CCM team was consulted for assistance with disease management and care coordination needs.    Engaged with patient by telephone for follow up visit in response to provider referral for pharmacy case management and/or care coordination services.   Consent to Services:  The patient was given the following information about Chronic Care Management services today, agreed to services, and gave verbal consent: 1. CCM service includes personalized support from designated clinical staff supervised by the primary care provider, including individualized plan of care and coordination with other care providers 2. 24/7 contact phone numbers for  assistance for urgent and routine care needs. 3. Service will only be billed when office clinical staff spend 20 minutes or more in a month to coordinate care. 4. Only one practitioner may furnish and bill the service in a calendar month. 5.The patient may stop CCM services at any time (effective at the end of the month) by phone call to the office staff. 6. The patient will be responsible for cost sharing (co-pay) of up to 20% of the service fee (after annual deductible is met). Patient agreed to services and consent obtained.  Patient Care Team: [name], MD as PCP - General (Internal Medicine) Knox Royalty, RN as Case Manager Danyl Deems, Darnelle Maffucci, [facility] as Pharmacist (Pharmacist)  Recent Office Visits: None since last visit    Recent consult visits:  None since last visit    Hospital visits:  04/14/2021 - To ED - persistent cough, chest feeling tight / difficulty breather - given breathing treatments in ED - improved - sent home with ventolin, benzonatate, and prednisone  Objective:  Lab Results  Component Value Date   CREATININE 0.67 04/14/2021   BUN 10 04/14/2021   GFRNONAA >60 04/14/2021   GFRAA 101 06/22/2020   NA 139 04/14/2021   K 3.6 04/14/2021   CALCIUM 9.6 04/14/2021   CO2 24 04/14/2021   GLUCOSE 144 (H) 04/14/2021    Lab Results  Component Value Date/Time   HGBA1C 7.6 (A) 03/24/2021 09:59 AM   HGBA1C 7.5 (A) 12/22/2020 10:14 AM   HGBA1C 8.9 (H) 04/28/2020 01:00 AM   HGBA1C 6.8 (H) 11/02/2019 05:58 AM    Last diabetic Eye exam:  Lab Results  Component Value Date/Time   HMDIABEYEEXA No Retinopathy  11/09/2020 02:50 PM    Last diabetic Foot exam:  No results found for: HMDIABFOOTEX   Lab Results  Component Value Date   CHOL 172 06/22/2020   HDL 48 06/22/2020   LDLCALC 102 (H) 06/22/2020   TRIG 126 06/22/2020   CHOLHDL 3.6 06/22/2020    Hepatic Function Latest Ref Rng & Units 10/21/2020 09/14/2020 09/10/2020  Total Protein 6.5 - 8.1 g/dL 6.9 6.7  7.7  Albumin 3.5 - 5.0 g/dL 3.6 3.9 4.3  AST 15 - 41 U/L 15 24 23   ALT 0 - 44 U/L 22 44 39  Alk Phosphatase 38 - 126 U/L 66 56 70  Total Bilirubin 0.3 - 1.2 mg/dL 0.9 0.6 0.4  Bilirubin, Direct 0.0 - 0.2 mg/dL - <0.1 <0.1    No results found for: TSH, FREET4  CBC Latest Ref Rng & Units 04/14/2021 03/25/2021 10/28/2020  WBC 4.0 - 10.5 K/uL 8.5 7.3 13.9(H)  Hemoglobin 13.0 - 17.0 g/dL 15.0 16.0 13.1  Hematocrit 39.0 - 52.0 % 44.5 47.8 39.5  Platelets 150 - 400 K/uL 255 248 245    No results found for: VD25OH  Clinical ASCVD: Yes  The ASCVD Risk score (Arnett DK, et al., 2019) failed to calculate for the following reasons:   The patient has a prior MI or stroke diagnosis    Depression screen [facility] 2/9 03/24/2021 01/14/2021 12/22/2020  Decreased Interest 0 0 0  Down, Depressed, Hopeless 0 0 0  PHQ - 2 Score 0 0 0   Social History   Tobacco Use  Smoking Status Every Day   Packs/day: 0.15   Years: 47.00   Pack years: 7.05   Types: Cigarettes  Smokeless Tobacco Never   BP Readings from Last 3 Encounters:  04/14/21 132/76  03/25/21 120/75  03/24/21 132/68   Pulse Readings from Last 3 Encounters:  04/14/21 89  03/25/21 93  03/24/21 100   Wt Readings from Last 3 Encounters:  03/25/21 167 lb 8.8 oz (76 kg)  03/24/21 169 lb (76.7 kg)  12/22/20 167 lb 6.4 oz (75.9 kg)   BMI Readings from Last 3 Encounters:  03/25/21 25.48 kg/m  03/24/21 25.70 kg/m  12/22/20 25.45 kg/m    Assessment/Interventions: Review of patient past medical history, allergies, medications, health status, including review of consultants reports, laboratory and other test data, was performed as part of comprehensive evaluation and provision of chronic care management services.   SDOH:  (Social Determinants of Health) assessments and interventions performed: Yes  SDOH Screenings   Alcohol Screen: Not on file  Depression (PHQ2-9): Low Risk    PHQ-2 Score: 0  Financial Resource Strain: High Risk    Difficulty of Paying Living Expenses: Hard  Food Insecurity: Food Insecurity Present   Worried About Charity fundraiser in the Last Year: Sometimes true   Ran Out of Food in the Last Year: Sometimes true  Housing: Medium Risk   Last Housing Risk Score: 1  Physical Activity: Not on file  Social Connections: Not on file  Stress: Not on file  Tobacco Use: High Risk   Smoking Tobacco Use: Every Day   Smokeless Tobacco Use: Never   Passive Exposure: Not on file  Transportation Needs: No Transportation Needs   Lack of Transportation (Medical): No   Lack of Transportation (Non-Medical): No    CCM Care Plan  No Known Allergies  Medications Reviewed Today     Reviewed by Tomasa Blase, [facility] (Pharmacist) on 05/13/21 at 1101  Med List Status: <  None>   Medication Order Taking? Sig Documenting Provider Last Dose Status Informant  albuterol (PROVENTIL) (2.5 MG/3ML) 0.083% nebulizer solution [id number]  USE 1 VIAL VIA NEBULIZER EVERY 6 HOURS AS NEEDED FOR WHEEZING OR SHORTNESS OF BREATH Sagardia, Ines Bloomer, MD  Active   albuterol (VENTOLIN HFA) 108 (90 Base) MCG/ACT inhaler [id number]  Inhale 2 puffs into the lungs every 6 (six) hours as needed for wheezing or shortness of breath. [name], MD  Active   amLODipine (NORVASC) 5 MG tablet [id number]  Take 1 tablet (5 mg total) by mouth daily. [name], MD  Active   atorvastatin (LIPITOR) 20 MG tablet [id number]  TAKE ONE TABLET BY MOUTH ONE TIME DAILY [name], MD  Active   benzonatate (TESSALON) 100 MG capsule [id number]  Take 1 capsule (100 mg total) by mouth every 8 (eight) hours. Dorie Rank, MD  Active   blood glucose meter kit and supplies [id number]  Dispense based on patient and insurance preference. Use up to four times daily as directed. (FOR ICD-10 E10.9, E11.9). [name], MD  Active Multiple Informants           Med Note Blanch Media, CYNTHIA A   Tue Dec 22, 2020 10:03 AM) use  blood glucose  meter kit and supplies KIT [id number]  Dispense based on patient and insurance preference. Use up to four times daily as directed. [name], MD  Active Multiple Informants           Med Note Blanch Media, CYNTHIA A   Tue Dec 22, 2020 10:03 AM) use  budesonide-formoterol [facility]) 160-4.5 MCG/ACT inhaler [id number]  Inhale 2 puffs into the lungs 2 (two) times daily. [name], MD  Active Multiple Informants           Med Note Maud Deed   Sun Sep 13, 2020  8:38 PM)    glipiZIDE (GLUCOTROL) 10 MG tablet [id number] Yes Take 1 tablet (10 mg total) by mouth 2 (two) times daily before a meal.  Patient taking differently: Take 5 mg by mouth daily before breakfast. Takes 1 more tablet if blood sugar is high   Maximiano Coss, NP Taking Active   glucose blood (ACCU-CHEK GUIDE) test strip [id number]  Use as instructed [name], MD  Active Multiple Informants           Med Note Blanch Media, CYNTHIA A   Tue Dec 22, 2020 10:03 AM) use  insulin degludec (TRESIBA) 100 UNIT/ML FlexTouch Pen [id number]  Inject 25 Units into the skin daily.  Patient taking differently: Inject 10 Units into the skin daily.   [name], MD  Active   insulin lispro (HUMALOG) 100 UNIT/ML KwikPen [id number] No Inject 3 Units into the skin 3 (three) times daily. Adjust amount of insulin as per sliding scale.  Patient not taking: Reported on 05/13/2021   [name], MD Not Taking Active   Insulin Pen Needle 32G X 4 MM MISC [id number]  USE TO INJECT LANTUS AND HUMALOG 4 TIMES DAILY Dahal, Marlowe Aschoff, MD  Active Multiple Informants           Med Note Blanch Media, CYNTHIA A   Tue Dec 22, 2020 10:04 AM) use  metFORMIN (GLUCOPHAGE) 1000 MG tablet [id number]  Take 1 tablet (1,000 mg total) by mouth 2 (two) times daily with a meal. [name], MD  Expired 09/20/20 2359 Self           Med Note (Delrick Dehart C  Fri Apr 09, 2021  9:03 AM)    methocarbamol (ROBAXIN) 500 MG tablet [id number]  No Take 1 tablet (500 mg total) by mouth 2 (two) times daily.  Patient not taking: No sig reported   Blanchie Dessert, MD Not Taking Active   ondansetron (ZOFRAN ODT) 4 MG disintegrating tablet [id number] No 68m ODT q4 hours prn nausea/vomit  Patient not taking: No sig reported   FDeno Etienne DO Not Taking Active   pantoprazole (PROTONIX) 40 MG tablet [id number] Take 1 tablet (40 mg total) by mouth daily. SHorald Pollen MD  Active             Patient Active Problem List   Diagnosis Date Noted   Hypertension associated with diabetes ([place] 09/21/2020   Other dysphagia    Hyperglycemia 09/13/2020   Uncontrolled type 2 diabetes mellitus with hyperglycemia ([place] 09/13/2020   CAD (coronary artery disease) 01/23/2020   Hyperlipidemia 11/02/2019   History of MI (myocardial infarction) 11/02/2019   Nonspecific chest pain 11/01/2019   Cigarette smoker 08/15/2018   COPD ? GOLD III/ active smoker 08/14/2018   History of diet-controlled diabetes 06/30/2018   COPD (chronic obstructive pulmonary disease) ([place] 10/14/2014   Type 2 diabetes mellitus with hyperglycemia, without long-term current use of insulin ([place] 10/14/2014    Immunization History  Administered Date(s) Administered   Fluad Quad(high Dose 65+) 03/24/2021   Influenza,inj,Quad PF,6+ Mos 09/21/2015, 07/02/2018   Influenza-Unspecified 08/03/2020   PFIZER(Purple Top)SARS-COV-2 Vaccination 09/29/2019, 06/12/2020, 10/20/2020   Pneumococcal Conjugate-13 09/21/2020   Pneumococcal Polysaccharide-23 10/15/2014, 09/21/2015    Conditions to be addressed/monitored:  Hypertension, Hyperlipidemia, Diabetes, GERD, and COPD  Care Plan : [place] Updates made by STomasa Blase [place] 05/13/2021 12:00 AM     Problem: HTN, CAD, HLD, COPD, DM2, GERD   Priority: High  Onset Date: 04/09/2021     Long-Range Goal: Disease Management   Start Date: 04/09/2021  Expected End Date: 10/08/2021  This Visit's Progress: Not on  track  Recent Progress: On track  Priority: High  Note:    Current Barriers:  Unable to independently afford treatment regimen Unable to independently monitor therapeutic efficacy Unable to maintain control of DM and COPD  Pharmacist Clinical Goal(s):  Patient will verbalize ability to afford treatment regimen achieve adherence to monitoring guidelines and medication adherence to achieve therapeutic efficacy achieve improvement in COPD and DM as evidenced by decreased rescue inhaler use / more stable BG through collaboration with PharmD and provider.   Interventions: 1:1 collaboration with SHorald Pollen MD regarding development and update of comprehensive plan of care as evidenced by provider attestation and co-signature Inter-disciplinary care team collaboration (see longitudinal plan of care) Comprehensive medication review performed; medication list updated in electronic medical record  Hypertension (BP goal <130/80) -Controlled -Current treatment: Amlodipine 529m- 1 tablet daily  -Medications previously tried: n/a  -Current home readings: n/a BP Readings from Last 3 Encounters:  04/14/21 132/76  03/25/21 120/75  03/24/21 132/68  -Current dietary habits: notes to adding salt to his food, does not watch sodium intake  -Current exercise habits: walks daily - mostly around/ inside the house  -Denies hypotensive/hypertensive symptoms -Educated on BP goals and benefits of medications for prevention of heart attack, stroke and kidney damage; Daily salt intake goal < 2300 mg; Exercise goal of 150 minutes per week; Importance of home blood pressure monitoring; Proper BP monitoring technique; -Counseled on diet and exercise extensively Recommended to continue current medication  Hyperlipidemia: (  LDL goal < 70) -Not ideally controlled Lab Results  Component Value Date   LDLCALC 102 (H) 06/22/2020  -Current treatment: Atorvastatin 20mg  - 1 tablet daily  -Medications  previously tried: n/a  -Current dietary patterns: fried foods on occasion - usually will eat fried chicken with rice and beans  -Current exercise habits: walks daily - mostly around/ inside the house  -Educated on Cholesterol goals;  Benefits of statin for ASCVD risk reduction; Importance of limiting foods high in cholesterol; Exercise goal of 150 minutes per week; -Counseled on diet and exercise extensively -will plan to discuss increase in atorvastatin dosing with next visit   Diabetes (A1c goal <7%) -Not ideally controlled Lab Results  Component Value Date   HGBA1C 7.6 (A) 03/24/2021  -Current medications: Glipizide 10mg  - 1 tablet daily  Metformin 1000mg  - 1 tablet twice daily  Tresiba - 25 units daily  Novolog-3 units 3 times daily  -Medications previously tried: lantus  -Current home glucose readings fasting glucose: AM 130, noon - 140-150, nighttime 200-230 -Reports hypoglycemic/hyperglycemic symptoms - occasionally can have overnight hypoglycemia - has dropped as low as 36  - corrected by drinking soda and eating a sandwich  -Current meal patterns:  breakfast: oatmeal, small sandwich, coffee  lunch: vegetables with fish  dinner: rice/ beans wit meat  snacks: small sandwich, plantains, fruit  drinks: diet soda  -Current exercise: walks daily - mostly around/ inside the house  -Educated on A1c and blood sugar goals; Complications of diabetes including kidney damage, retinal damage, and cardiovascular disease; Exercise goal of 150 minutes per week; Benefits of weight loss; Proper insulin injection technique; Prevention and management of hypoglycemic episodes; Benefits of routine self-monitoring of blood sugar; Continuous glucose monitoring; Carbohydrate counting and/or plate method -Counseled to check feet daily and get yearly eye exams -Counseled on diet and exercise extensively Recommended for patient to start trulicity 0.75mg  weekly - once medication is started would  recommend for patient to hold novolog and decrease tresiba to 10 units daily  -Confirmed with patient no family history of thyroid cancer / no previous history of pancreatitis  COPD (Goal: control symptoms and prevent exacerbations) -Not ideally controlled -Current treatment  Symbicort 160-4.46mcg/act - 2 puffs twice daily  Albuterol 163mcg/act - 2 puffs every 4 hours as needed  - using every 4 hours  Albuterol 0.0083% nebulizer solution - 1 vial every 6 hours as needed - using 2-4 times daily  -Medications previously tried: n/a  -Gold Grade: Gold 3 (FEV1 30-49%) -Current COPD Classification:  D (high sx, >/=2 exacerbations/yr) -MMRC/CAT score: not availabel -Pulmonary function testing: 08/14/2018 -Exacerbations requiring treatment in last 6 months: 2 -Patient reports consistent use of maintenance inhaler -Frequency of rescue inhaler use: 6 times daily, nebulizer use 2-4 times daily  -Counseled on Proper inhaler technique; Benefits of consistent maintenance inhaler use When to use rescue inhaler Differences between maintenance and rescue inhalers -Recommended for increase in maintenance intensity - recommending breztri - 2 puffs twice daily - patient assistance pending   GERD (Goal: Prevention / control of acid reflux) -Controlled -Current treatment  Pantoprazole 40mg  - 1 tablet daily  -Medications previously tried: n/a  -Counseled on diet and exercise extensively Recommended to continue current medication  Patient Goals/Self-Care Activities Patient will:  - take medications as prescribed check glucose at least 3 times daily, document, and provide at future appointments collaborate with provider on medication access solutions target a minimum of 150 minutes of moderate intensity exercise weekly engage in dietary modifications by reducing  sodium intake / moderation of carbohydrate intake  Follow Up Plan: Telephone follow up appointment with care management team member scheduled  for: 1 month The patient has been provided with contact information for the care management team and has been advised to call with any health related questions or concerns.          Medication Assistance: Application for trulicty and breztri  medication assistance program. in process.  Anticipated assistance start date 05/10/2021.  See plan of care for additional detail.  Patient's preferred pharmacy is:  [facility] [address] [place], [place]. AT [address] [place] [address] Phone: [phone] Fax: [phone]  [facility] [place], [place] [address] Phone: [phone] Fax: [phone]  [facility] DR[facility]ORE [place], [place] [address] Phone: [phone] Fax: [phone]   Uses pill box? Yes Pt endorses 100% compliance  Care Plan and Follow Up Patient Decision:  Patient agrees to Care Plan and Follow-up.  Plan: Telephone follow up appointment with care management team member scheduled for:  1 month and The patient has been provided with contact information for the care management team and has been advised to call with any health related questions or concerns.   Tomasa Blase, PharmD Clinical Pharmacist, [place]   Chart prep = 5 minutes

## 2021-05-13 NOTE — Patient Instructions (Signed)
Duane Cuevas,  It was great to talk to you today!  Please call me with any questions or concerns.  Visit Information  Patient verbalizes understanding of instructions provided today and agrees to view in [place].   Telephone follow up appointment with care management team member scheduled for: The patient has been provided with contact information for the care management team and has been advised to call with any health related questions or concerns.   Tomasa Blase, PharmD Clinical Pharmacist, [place]

## 2021-05-17 ENCOUNTER — Telehealth: Payer: Self-pay | Admitting: Emergency Medicine

## 2021-05-17 DIAGNOSIS — E1165 Type 2 diabetes mellitus with hyperglycemia: Secondary | ICD-10-CM

## 2021-05-17 MED ORDER — GLIPIZIDE 10 MG PO TABS: 10.0000 mg | ORAL_TABLET | Freq: Every day | ORAL | 1 refills | Status: DC

## 2021-05-17 NOTE — Telephone Encounter (Signed)
1.Medication Requested: glipiZIDE (GLUCOTROL) 5 MG tablet [[id number]]  DISCONTINUED  (pharmacy states they still show that the patient is on 5mg )  2. Pharmacy (Name, Street, [place]):   [facility] [place], [place]. AT [address] Phone:  [phone]  Fax:  [phone]      3. On Med List: Not on current list, current list reflects 10mg   4. Last Visit with PCP:  9.21.22  5. Next visit date with PCP:  not scheduled yet   Agent: Please be advised that RX refills may take up to 3 business days. We ask that you follow-up with your pharmacy.

## 2021-05-18 ENCOUNTER — Telehealth: Payer: Self-pay | Admitting: Emergency Medicine

## 2021-05-18 ENCOUNTER — Other Ambulatory Visit (HOSPITAL_COMMUNITY): Payer: Self-pay | Admitting: Neurosurgery

## 2021-05-18 DIAGNOSIS — I671 Cerebral aneurysm, nonruptured: Secondary | ICD-10-CM

## 2021-05-18 NOTE — Telephone Encounter (Signed)
1.Medication Requested: -methocarbamol (ROBAXIN) 500 MG tablet -ondansetron (ZOFRAN ODT) 4 MG -disintegrating tablet -pantoprazole (PROTONIX) 40 MG tablet -amLODipine (NORVASC) 5 MG tablet -atorvastatin (LIPITOR) 20 MG tablet -benzonatate (TESSALON) 100 MG capsule -blood glucose meter kit and supplies -budesonide-formoterol (SYMBICORT) 160-4.5 MCG/ACT inhaler -glipiZIDE (GLUCOTROL) 10 MG tablet glucose blood (ACCU-CHEK GUIDE) test strip -insulin degludec (TRESIBA) 100 UNIT/ML FlexTouch Pen -insulin lispro (HUMALOG) 100 UNIT/ML KwikPen -Insulin Pen Needle 32G X 4 MM MISC  2. Pharmacy (Name, Street, [facility]):  [place], [place]  Phone:  [phone] Fax:  [phone]   3. On Med List: yes  4. Last Visit with PCP: 09.21.22   5. Next visit date with PCP: n/a   Agent: Please be advised that RX refills may take up to 3 business days. We ask that you follow-up with your pharmacy.

## 2021-05-18 NOTE — Telephone Encounter (Signed)
Received a fax from [facility] requesting medication, signed and faxed back request.

## 2021-05-21 ENCOUNTER — Other Ambulatory Visit: Payer: Self-pay | Admitting: Internal Medicine

## 2021-05-24 ENCOUNTER — Encounter: Payer: Self-pay | Admitting: *Deleted

## 2021-05-24 ENCOUNTER — Telehealth: Payer: Self-pay | Admitting: *Deleted

## 2021-05-24 ENCOUNTER — Other Ambulatory Visit: Payer: Self-pay

## 2021-05-24 ENCOUNTER — Telehealth: Payer: Medicare HMO

## 2021-05-24 ENCOUNTER — Other Ambulatory Visit (HOSPITAL_COMMUNITY): Payer: Self-pay

## 2021-05-24 DIAGNOSIS — E1165 Type 2 diabetes mellitus with hyperglycemia: Secondary | ICD-10-CM

## 2021-05-24 DIAGNOSIS — J449 Chronic obstructive pulmonary disease, unspecified: Secondary | ICD-10-CM

## 2021-05-24 MED ORDER — INSULIN LISPRO (1 UNIT DIAL) 100 UNIT/ML (KWIKPEN): 3.0000 [IU] | PEN_INJECTOR | Freq: Three times a day (TID) | SUBCUTANEOUS | 3 refills | Status: DC

## 2021-05-24 MED ORDER — ALBUTEROL SULFATE (2.5 MG/3ML) 0.083% IN NEBU: INHALATION_SOLUTION | RESPIRATORY_TRACT | 3 refills | Status: DC

## 2021-05-24 MED ORDER — INSULIN PEN NEEDLE 32G X 4 MM MISC: 0 refills | Status: DC

## 2021-05-24 NOTE — Progress Notes (Signed)
Called and spoke with patient through interpreter service, he states that he was out of insulin and lancets. [facility].

## 2021-05-24 NOTE — Telephone Encounter (Signed)
  Chronic Care Management   Follow Up Note   05/24/2021 Name: Duane Cuevas MRN: [id number] DOB: 1955/11/04  Referred by: [name], MD Reason for referral : No chief complaint on file.  An unsuccessful telephone outreach was attempted today. The patient was referred to the case management team for assistance with care management and care coordination.   Attempted follow up call to patient x 2 this morning, using [employer]- both calls unsuccessful: left voice messages x 2; explained in second attempt voice message that I would have scheduling care guide contact patient to re-schedule, as call requires interpreter services  Follow Up Plan:  A HIPPA compliant phone message x 2 attempts was left for the patient providing contact information and requesting a return call Will place request with scheduling care guide to contact patient to re-schedule today's missed CCM RN follow up telephone appointment   [name], RN, BSN, [place] [phone]: direct office [phone]: mobile

## 2021-05-26 ENCOUNTER — Ambulatory Visit (HOSPITAL_COMMUNITY): Admission: RE | Admit: 2021-05-26 | Payer: Medicare HMO | Source: Ambulatory Visit

## 2021-05-26 ENCOUNTER — Encounter: Payer: Self-pay | Admitting: Internal Medicine

## 2021-05-26 ENCOUNTER — Ambulatory Visit (INDEPENDENT_AMBULATORY_CARE_PROVIDER_SITE_OTHER): Payer: Medicare HMO | Admitting: Internal Medicine

## 2021-05-26 ENCOUNTER — Other Ambulatory Visit: Payer: Self-pay

## 2021-05-26 ENCOUNTER — Telehealth: Payer: Self-pay | Admitting: Emergency Medicine

## 2021-05-26 VITALS — BP 132/78 | HR 95 | Temp 98.3°F | Resp 16 | Ht 68.0 in | Wt 172.0 lb

## 2021-05-26 DIAGNOSIS — M542 Cervicalgia: Secondary | ICD-10-CM | POA: Diagnosis not present

## 2021-05-26 DIAGNOSIS — G43511 Persistent migraine aura without cerebral infarction, intractable, with status migrainosus: Secondary | ICD-10-CM | POA: Diagnosis not present

## 2021-05-26 MED ORDER — UBRELVY 100 MG PO TABS: 1.0000 | ORAL_TABLET | Freq: Every day | ORAL | 1 refills | Status: DC

## 2021-05-26 NOTE — Patient Instructions (Signed)
Migraine Headache A migraine headache is an intense, throbbing pain on one side or both sides of the head. Migraine headaches may also cause other symptoms, such as nausea, vomiting, and sensitivity to light and noise. A migraine headache can last from 4 hours to 3 days. Talk with your doctor about what things may bring on (trigger) your migraine headaches. What are the causes? The exact cause of this condition is not known. However, a migraine may be caused when nerves in the brain become irritated and release chemicals that cause inflammation of blood vessels. This inflammation causes pain. This condition may be triggered or caused by: Drinking alcohol. Smoking. Taking medicines, such as: Medicine used to treat chest pain (nitroglycerin). Birth control pills. Estrogen. Certain blood pressure medicines. Eating or drinking products that contain nitrates, glutamate, aspartame, or tyramine. Aged cheeses, chocolate, or caffeine may also be triggers. Doing physical activity. Other things that may trigger a migraine headache include: Menstruation. Pregnancy. Hunger. Stress. Lack of sleep or too much sleep. Weather changes. Fatigue. What increases the risk? The following factors may make you more likely to experience migraine headaches: Being a certain age. This condition is more common in people who are 25-55 years old. Being male. Having a family history of migraine headaches. Being Caucasian. Having a mental health condition, such as depression or anxiety. Being obese. What are the signs or symptoms? The main symptom of this condition is pulsating or throbbing pain. This pain may: Happen in any area of the head, such as on one side or both sides. Interfere with daily activities. Get worse with physical activity. Get worse with exposure to bright lights or loud noises. Other symptoms may include: Nausea. Vomiting. Dizziness. General sensitivity to bright lights, loud noises, or  smells. Before you get a migraine headache, you may get warning signs (an aura). An aura may include: Seeing flashing lights or having blind spots. Seeing bright spots, halos, or zigzag lines. Having tunnel vision or blurred vision. Having numbness or a tingling feeling. Having trouble talking. Having muscle weakness. Some people have symptoms after a migraine headache (postdromal phase), such as: Feeling tired. Difficulty concentrating. How is this diagnosed? A migraine headache can be diagnosed based on: Your symptoms. A physical exam. Tests, such as: CT scan or an MRI of the head. These imaging tests can help rule out other causes of headaches. Taking fluid from the spine (lumbar puncture) and analyzing it (cerebrospinal fluid analysis, or CSF analysis). How is this treated? This condition may be treated with medicines that: Relieve pain. Relieve nausea. Prevent migraine headaches. Treatment for this condition may also include: Acupuncture. Lifestyle changes like avoiding foods that trigger migraine headaches. Biofeedback. Cognitive behavioral therapy. Follow these instructions at home: Medicines Take over-the-counter and prescription medicines only as told by your health care provider. Ask your health care provider if the medicine prescribed to you: Requires you to avoid driving or using heavy machinery. Can cause constipation. You may need to take these actions to prevent or treat constipation: Drink enough fluid to keep your urine pale yellow. Take over-the-counter or prescription medicines. Eat foods that are high in fiber, such as beans, whole grains, and fresh fruits and vegetables. Limit foods that are high in fat and processed sugars, such as fried or sweet foods. Lifestyle Do not drink alcohol. Do not use any products that contain nicotine or tobacco, such as cigarettes, e-cigarettes, and chewing tobacco. If you need help quitting, ask your health care  provider. Get at least 8   hours of sleep every night. Find ways to manage stress, such as meditation, deep breathing, or yoga. General instructions   Keep a journal to find out what may trigger your migraine headaches. For example, write down: What you eat and drink. How much sleep you get. Any change to your diet or medicines. If you have a migraine headache: Avoid things that make your symptoms worse, such as bright lights. It may help to lie down in a dark, quiet room. Do not drive or use heavy machinery. Ask your health care provider what activities are safe for you while you are experiencing symptoms. Keep all follow-up visits as told by your health care provider. This is important. Contact a health care provider if: You develop symptoms that are different or more severe than your usual migraine headache symptoms. You have more than 15 headache days in one month. Get help right away if: Your migraine headache becomes severe. Your migraine headache lasts longer than 72 hours. You have a fever. You have a stiff neck. You have vision loss. Your muscles feel weak or like you cannot control them. You start to lose your balance often. You have trouble walking. You faint. You have a seizure. Summary A migraine headache is an intense, throbbing pain on one side or both sides of the head. Migraines may also cause other symptoms, such as nausea, vomiting, and sensitivity to light and noise. This condition may be treated with medicines and lifestyle changes. You may also need to avoid certain things that trigger a migraine headache. Keep a journal to find out what may trigger your migraine headaches. Contact your health care provider if you have more than 15 headache days in a month or you develop symptoms that are different or more severe than your usual migraine headache symptoms. This information is not intended to replace advice given to you by your health care provider. Make sure you  discuss any questions you have with your health care provider. Document Revised: 10/12/2018 Document Reviewed: 08/02/2018 Elsevier Patient Education  2022 [employer]  

## 2021-05-26 NOTE — Progress Notes (Signed)
Subjective:  Patient ID: Duane Cuevas, male    DOB: 1956-04-17  Age: 65 y.o. MRN: [id number]  CC: Migraine  This visit occurred during the SARS-CoV-2 public health emergency.  Safety protocols were in place, including screening questions prior to the visit, additional usage of staff PPE, and extensive cleaning of exam room while observing appropriate contact time as indicated for disinfecting solutions.    HPI Duane Cuevas presents for f/up -   New to me.  He complains of a 4-day history of left-sided frontal headache.  He said he has had these headaches previously.  He describes it as a pounding sensation with photo and phonophobia.  He has not taken anything to control the symptoms.  He says his headaches are rare.  He denies changes in his vision, hearing, nausea, vomiting, or paresthesias.  He also complains of chronic neck pain that radiates into his left upper extremity.  Outpatient Medications Prior to Visit  Medication Sig Dispense Refill   albuterol (PROVENTIL) (2.5 MG/3ML) 0.083% nebulizer solution USE 1 VIAL VIA NEBULIZER EVERY 6 HOURS AS NEEDED FOR WHEEZING OR SHORTNESS OF BREATH 150 mL 3   albuterol (VENTOLIN HFA) 108 (90 Base) MCG/ACT inhaler Inhale 2 puffs into the lungs every 6 (six) hours as needed for wheezing or shortness of breath. 1 each 3   amLODipine (NORVASC) 5 MG tablet Take 1 tablet (5 mg total) by mouth daily. 90 tablet 3   atorvastatin (LIPITOR) 20 MG tablet TAKE ONE TABLET BY MOUTH ONE TIME DAILY 90 tablet 3   benzonatate (TESSALON) 100 MG capsule Take 1 capsule (100 mg total) by mouth every 8 (eight) hours. 21 capsule 0   blood glucose meter kit and supplies KIT Dispense based on patient and insurance preference. Use up to four times daily as directed. 1 each 0   blood glucose meter kit and supplies Dispense based on patient and insurance preference. Use up to four times daily as directed. (FOR ICD-10 E10.9, E11.9). 1 each 0   budesonide-formoterol  (SYMBICORT) 160-4.5 MCG/ACT inhaler Inhale 2 puffs into the lungs 2 (two) times daily. 1 each 11   glipiZIDE (GLUCOTROL) 10 MG tablet Take 1 tablet (10 mg total) by mouth daily before breakfast. Takes 1 more tablet if blood sugar is high 90 tablet 1   glucose blood (ACCU-CHEK GUIDE) test strip Use as instructed 100 strip 11   insulin degludec (TRESIBA) 100 UNIT/ML FlexTouch Pen Inject 25 Units into the skin daily. (Patient taking differently: Inject 10 Units into the skin daily.) 18 mL 5   insulin lispro (HUMALOG) 100 UNIT/ML KwikPen Inject 3 Units into the skin 3 (three) times daily. Adjust amount of insulin as per sliding scale. 8.1 mL 3   Insulin Pen Needle 32G X 4 MM MISC USE TO INJECT LANTUS AND HUMALOG 4 TIMES DAILY 400 each 0   methocarbamol (ROBAXIN) 500 MG tablet Take 1 tablet (500 mg total) by mouth 2 (two) times daily. 20 tablet 0   ondansetron (ZOFRAN ODT) 4 MG disintegrating tablet 4mg  ODT q4 hours prn nausea/vomit 20 tablet 0   pantoprazole (PROTONIX) 40 MG tablet Take 1 tablet (40 mg total) by mouth daily. 90 tablet 3   metFORMIN (GLUCOPHAGE) 1000 MG tablet Take 1 tablet (1,000 mg total) by mouth 2 (two) times daily with a meal. 180 tablet 3   Facility-Administered Medications Prior to Visit  Medication Dose Route Frequency Provider Last Rate Last Admin   morphine 2 MG/ML injection  ROS Review of Systems  Constitutional:  Negative for chills, diaphoresis, fatigue and fever.  HENT: Negative.    Eyes: Negative.   Respiratory:  Negative for cough, chest tightness and wheezing.   Cardiovascular:  Negative for chest pain, palpitations and leg swelling.  Gastrointestinal:  Negative for abdominal pain, constipation, diarrhea, nausea and vomiting.  Endocrine: Negative.   Genitourinary: Negative.  Negative for difficulty urinating.  Musculoskeletal:  Positive for neck pain. Negative for back pain.  Skin: Negative.   Neurological: Negative.  Negative for dizziness,  weakness, light-headedness, numbness and headaches.  Hematological:  Negative for adenopathy. Does not bruise/bleed easily.  Psychiatric/Behavioral: Negative.     Objective:  BP 132/78 (BP Location: Right Arm, Patient Position: Sitting, Cuff Size: Large)   Pulse 95   Temp 98.3 F (36.8 C) (Oral)   Ht $R'5\' 8"'Ki$  (1.727 m)   Wt 172 lb (78 kg)   SpO2 100%   BMI 26.15 kg/m   BP Readings from Last 3 Encounters:  05/26/21 132/78  04/14/21 132/76  03/25/21 120/75    Wt Readings from Last 3 Encounters:  05/26/21 172 lb (78 kg)  03/25/21 167 lb 8.8 oz (76 kg)  03/24/21 169 lb (76.7 kg)    Physical Exam Vitals reviewed.  HENT:     Nose: Nose normal.     Mouth/Throat:     Mouth: Mucous membranes are moist.  Eyes:     General: No scleral icterus.    Conjunctiva/sclera: Conjunctivae normal.  Neck:   Cardiovascular:     Rate and Rhythm: Normal rate and regular rhythm.     Heart sounds: No murmur heard. Pulmonary:     Effort: Pulmonary effort is normal.     Breath sounds: No stridor. No wheezing, rhonchi or rales.  Abdominal:     General: Abdomen is flat.     Palpations: There is no mass.     Tenderness: There is no abdominal tenderness. There is no guarding.     Hernia: No hernia is present.  Musculoskeletal:        General: Normal range of motion.     Cervical back: Full passive range of motion without pain, normal range of motion and neck supple. No signs of trauma, rigidity or crepitus. No pain with movement. Normal range of motion.     Right lower leg: No edema.     Left lower leg: No edema.  Skin:    General: Skin is warm and dry.  Neurological:     General: No focal deficit present.     Mental Status: He is alert and oriented to person, place, and time. Mental status is at baseline.     Sensory: Sensation is intact.     Motor: Motor function is intact. No weakness.     Coordination: Coordination is intact.     Gait: Gait is intact. Gait normal.     Deep Tendon  Reflexes: Reflexes normal.     Reflex Scores:      Tricep reflexes are 1+ on the right side and 1+ on the left side.      Bicep reflexes are 1+ on the right side and 1+ on the left side.      Brachioradialis reflexes are 1+ on the right side and 1+ on the left side.      Patellar reflexes are 1+ on the right side and 1+ on the left side.      Achilles reflexes are 1+ on the right side and 1+  on the left side.   Lab Results  Component Value Date   WBC 8.5 04/14/2021   HGB 15.0 04/14/2021   HCT 44.5 04/14/2021   PLT 255 04/14/2021   GLUCOSE 144 (H) 04/14/2021   CHOL 172 06/22/2020   TRIG 126 06/22/2020   HDL 48 06/22/2020   LDLCALC 102 (H) 06/22/2020   ALT 22 10/21/2020   AST 15 10/21/2020   NA 139 04/14/2021   K 3.6 04/14/2021   CL 103 04/14/2021   CREATININE 0.67 04/14/2021   BUN 10 04/14/2021   CO2 24 04/14/2021   INR 0.9 01/04/2008   HGBA1C 7.6 (A) 03/24/2021    DG Chest 2 View  Result Date: 04/14/2021 CLINICAL DATA:  Chest pain, coughing EXAM: CHEST - 2 VIEW COMPARISON:  Chest radiograph 03/25/2021 FINDINGS: The cardiomediastinal silhouette is stable. There is no focal consolidation or pulmonary edema. There is no pleural effusion or pneumothorax. There is no acute osseous abnormality. IMPRESSION: No radiographic evidence of acute cardiopulmonary process. Electronically Signed   By: Valetta Mole M.D.   On: 04/14/2021 16:54    2012-  CT CERVICAL SPINE     Findings: No evidence of acute cervical fracture or listhesis.  Degenerative changes are present throughout the cervical spine,  especially at C5-6 and C6-7.  No soft tissue abnormalities.     IMPRESSION:  No acute cervical fracture.   Assessment & Plan:   Duane Cuevas was seen today for migraine.  Diagnoses and all orders for this visit:  Neck pain on left side- Will check for worsening DDD.  -     DG Cervical Spine Complete; Future  Intractable persistent migraine aura without cerebral infarction and with  status migrainosus- Will treat with a CGRP-antagonist. I gave him 3 samples. -     Ubrogepant (UBRELVY) 100 MG TABS; Take 1 tablet by mouth daily.  I am having Duane Cuevas start on [place]. I am also having him maintain his metFORMIN, budesonide-formoterol, blood glucose meter kit and supplies, Accu-Chek Guide, blood glucose meter kit and supplies, ondansetron, amLODipine, pantoprazole, insulin degludec, methocarbamol, benzonatate, albuterol, atorvastatin, glipiZIDE, Insulin Pen Needle, insulin lispro, and albuterol.  Meds ordered this encounter  Medications   Ubrogepant (UBRELVY) 100 MG TABS    Sig: Take 1 tablet by mouth daily.    Dispense:  30 tablet    Refill:  1      Follow-up: Return in about 4 weeks (around 06/23/2021).  Scarlette Calico, MD

## 2021-05-26 NOTE — Telephone Encounter (Signed)
Pt. Connected to [facility].    Caller states her father has a headache and a knot on the back of the neck. Daughter is interpreting for him as he is Spanish speaking.   Advised to see PCP within 24 hours.

## 2021-05-26 NOTE — Telephone Encounter (Signed)
Caller, patient's daughter Angellica states patient has had a headache and a knot on the back of his neck x3d, advised caller I was transferring them to the triage nurse  Caller denied any other symptoms  Call transferred caller to [facility]

## 2021-06-01 ENCOUNTER — Telehealth: Payer: Self-pay

## 2021-06-01 NOTE — Telephone Encounter (Signed)
PA has submitted.   Key: [id number]

## 2021-06-02 ENCOUNTER — Ambulatory Visit: Payer: Medicare HMO | Admitting: Emergency Medicine

## 2021-06-02 DIAGNOSIS — E1165 Type 2 diabetes mellitus with hyperglycemia: Secondary | ICD-10-CM | POA: Diagnosis not present

## 2021-06-02 DIAGNOSIS — J449 Chronic obstructive pulmonary disease, unspecified: Secondary | ICD-10-CM

## 2021-06-03 ENCOUNTER — Other Ambulatory Visit: Payer: Self-pay

## 2021-06-03 ENCOUNTER — Ambulatory Visit (HOSPITAL_COMMUNITY)
Admission: RE | Admit: 2021-06-03 | Discharge: 2021-06-03 | Disposition: A | Discharge status: Home / Self Care | Payer: Medicare HMO | Source: Ambulatory Visit | Attending: Neurosurgery | Admitting: Neurosurgery

## 2021-06-03 DIAGNOSIS — I671 Cerebral aneurysm, nonruptured: Secondary | ICD-10-CM | POA: Insufficient documentation

## 2021-06-08 NOTE — Telephone Encounter (Signed)
Rescheduled 06/30/21

## 2021-06-10 ENCOUNTER — Other Ambulatory Visit: Payer: Self-pay

## 2021-06-10 ENCOUNTER — Ambulatory Visit (INDEPENDENT_AMBULATORY_CARE_PROVIDER_SITE_OTHER): Payer: Medicare HMO

## 2021-06-10 DIAGNOSIS — J449 Chronic obstructive pulmonary disease, unspecified: Secondary | ICD-10-CM

## 2021-06-10 DIAGNOSIS — E1165 Type 2 diabetes mellitus with hyperglycemia: Secondary | ICD-10-CM

## 2021-06-10 NOTE — Patient Instructions (Addendum)
Visit Information  Following are the goals we discussed today:   Set My Target A1c- DM  Timeframe:  Long-Range Goal Priority:  High Start Date:  04/09/2021                           Expected End Date:  10/08/2021                     Follow Up Date 07/10/2021   - set target A1C <7.0% - attain a more stable blood sugar  throughout the day     Why is this important?   Your target A1C is decided together by you and your doctor.  It is based on several things like your age and other health issues  Plan: Telephone follow up appointment with care management team member scheduled for:  1 month The patient has been provided with contact information for the care management team and has been advised to call with any health related questions or concerns.   Tomasa Blase, PharmD Clinical Pharmacist, [facility]   Please call the care guide team at [phone] if you need to cancel or reschedule your appointment.   Patient verbalizes understanding of instructions provided today and agrees to view in [place].

## 2021-06-10 NOTE — Progress Notes (Signed)
Chronic Care Management Pharmacy Note  07/13/2021 Name:  Duane Cuevas MRN:  [id number] DOB:  1956/01/11  Summary: - spoke with patient via translator service, patient confirms that he has started trulicity - has used 5 injections so far, stopped novolog -Patient reports that BG has been averaging 100-120 - denies any issues or side effects since starting trulicity  -Patient continues to use Symbicort twice daily - using albuterol 4-5 times daily - still having issues with SOB / DOE -was in to see Dr. Ronnald Ramp about migraines - has started [place] - effective at control of migraines - Roselyn Meier is affordable copay is ~$9   Recommendations/Changes made from today's visit: - Recommending for patient to stop tresiba at this time, recommending for patient to increase glipizide to 44m in AM and 122min PM with meals - continue trulicity 0.7.94FEeekly - patient to continue monitoring blood sugars - to reach out should BG >150 or if he has issues with low blood sugars  -Status update on breztri - does not qualify at this time as he may be eligible for medicaid - attempted to call to discuss with patient / daughter but no answer x 2   Subjective: AnCopeland Neisens an [age].o. year old male who is a primary patient of Sagardia, MiInes BloomerMD.  The CCM team was consulted for assistance with disease management and care coordination needs.    Engaged with patient by telephone for follow up visit in response to provider referral for pharmacy case management and/or care coordination services.   Consent to Services:  The patient was given the following information about Chronic Care Management services today, agreed to services, and gave verbal consent: 1. CCM service includes personalized support from designated clinical staff supervised by the primary care provider, including individualized plan of care and coordination with other care providers 2. 24/7 contact phone numbers for assistance for urgent  and routine care needs. 3. Service will only be billed when office clinical staff spend 20 minutes or more in a month to coordinate care. 4. Only one practitioner may furnish and bill the service in a calendar month. 5.The patient may stop CCM services at any time (effective at the end of the month) by phone call to the office staff. 6. The patient will be responsible for cost sharing (co-pay) of up to 20% of the service fee (after annual deductible is met). Patient agreed to services and consent obtained.  Patient Care Team: SaHorald PollenMD as PCP - General (Internal Medicine) ToKnox RoyaltyRN as Case Manager Avyonna Wagoner, DaDarnelle MaffucciRPNorth [facility] Pharmacist (Pharmacist)  Recent Office Visits: 05/26/2021 - Dr. JoRonnald Ramp 4 day history of left sided frontal headache - started on Ubrelvy    Recent consult visits:  None since last visit    Hospital visits:  04/14/2021 - To ED - persistent cough, chest feeling tight / difficulty breather - given breathing treatments in ED - improved - sent home with ventolin, benzonatate, and prednisone  Objective:  Lab Results  Component Value Date   CREATININE 0.67 04/14/2021   BUN 10 04/14/2021   GFRNONAA >60 04/14/2021   GFRAA 101 06/22/2020   NA 139 04/14/2021   K 3.6 04/14/2021   CALCIUM 9.6 04/14/2021   CO2 24 04/14/2021   GLUCOSE 144 (H) 04/14/2021    Lab Results  Component Value Date/Time   HGBA1C 7.6 (A) 03/24/2021 09:59 AM   HGBA1C 7.5 (A) 12/22/2020 10:14 AM   HGBA1C 8.9 (H)  04/28/2020 01:00 AM   HGBA1C 6.8 (H) 11/02/2019 05:58 AM    Last diabetic Eye exam:  Lab Results  Component Value Date/Time   HMDIABEYEEXA No Retinopathy 11/09/2020 02:50 PM    Last diabetic Foot exam:  No results found for: HMDIABFOOTEX   Lab Results  Component Value Date   CHOL 172 06/22/2020   HDL 48 06/22/2020   LDLCALC 102 (H) 06/22/2020   TRIG 126 06/22/2020   CHOLHDL 3.6 06/22/2020    Hepatic Function Latest Ref Rng & Units 10/21/2020 09/14/2020  09/10/2020  Total Protein 6.5 - 8.1 g/dL 6.9 6.7 7.7  Albumin 3.5 - 5.0 g/dL 3.6 3.9 4.3  AST 15 - 41 U/L _0 ALT 0 - 44 U/L 22 44 39  Alk Phosphatase 38 - 126 U/L 66 56 70  Total Bilirubin 0.3 - 1.2 mg/dL 0.9 0.6 0.4  Bilirubin, Direct 0.0 - 0.2 mg/dL - <0.1 <0.1    No results found for: TSH, FREET4  CBC Latest Ref Rng & Units 04/14/2021 03/25/2021 10/28/2020  WBC 4.0 - 10.5 K/uL 8.5 7.3 13.9(H)  Hemoglobin 13.0 - 17.0 g/dL 15.0 16.0 13.1  Hematocrit 39.0 - 52.0 % 44.5 47.8 39.5  Platelets 150 - 400 K/uL 255 248 245    No results found for: VD25OH  Clinical ASCVD: Yes  The ASCVD Risk score (Arnett DK, et al., 2019) failed to calculate for the following reasons:   The patient has a prior MI or stroke diagnosis    Depression screen [facility] 2/9 03/24/2021 01/14/2021 12/22/2020  Decreased Interest 0 0 0  Down, Depressed, Hopeless 0 0 0  PHQ - 2 Score 0 0 0   Social History   Tobacco Use  Smoking Status Every Day   Packs/day: 0.15   Years: 47.00   Pack years: 7.05   Types: Cigarettes  Smokeless Tobacco Never   BP Readings from Last 3 Encounters:  05/26/21 132/78  04/14/21 132/76  03/25/21 120/75   Pulse Readings from Last 3 Encounters:  05/26/21 95  04/14/21 89  03/25/21 93   Wt Readings from Last 3 Encounters:  05/26/21 172 lb (78 kg)  03/25/21 167 lb 8.8 oz (76 kg)  03/24/21 169 lb (76.7 kg)   BMI Readings from Last 3 Encounters:  05/26/21 26.15 kg/m  03/25/21 25.48 kg/m  03/24/21 25.70 kg/m    Assessment/Interventions: Review of patient past medical history, allergies, medications, health status, including review of consultants reports, laboratory and other test data, was performed as part of comprehensive evaluation and provision of chronic care management services.   SDOH:  (Social Determinants of Health) assessments and interventions performed: Yes  SDOH Screenings   Alcohol Screen: Not on file  Depression (PHQ2-9): Low Risk    PHQ-2 Score: 0   Financial Resource Strain: High Risk   Difficulty of Paying Living Expenses: Hard  Food Insecurity: No Food Insecurity   Worried About Charity fundraiser in the Last Year: Never true   Ran Out of Food in the Last Year: Never true  Housing: Low Risk    Last Housing Risk Score: 0  Physical Activity: Not on file  Social Connections: Not on file  Stress: Not on file  Tobacco Use: High Risk   Smoking Tobacco Use: Every Day   Smokeless Tobacco Use: Never   Passive Exposure: Not on file  Transportation Needs: No Transportation Needs   Lack of Transportation (Medical): No   Lack of Transportation (Non-Medical): No    CCM  Care Plan  No Known Allergies  Medications Reviewed Today     Reviewed by Knox Royalty, RN (Registered Nurse) on 06/30/21 at 66  Med List Status: <None>   Medication Order Taking? Sig Documenting Provider Last Dose Status Informant  albuterol (PROVENTIL) (2.5 MG/3ML) 0.083% nebulizer solution [id number] No USE 1 VIAL VIA NEBULIZER EVERY 6 HOURS AS NEEDED FOR WHEEZING OR SHORTNESS OF BREATH Sagardia, Ines Bloomer, MD Taking Active   albuterol (VENTOLIN HFA) 108 (90 Base) MCG/ACT inhaler [id number] No Inhale 2 puffs into the lungs every 6 (six) hours as needed for wheezing or shortness of breath. [name], MD Taking Active   amLODipine (NORVASC) 5 MG tablet [id number] No Take 1 tablet (5 mg total) by mouth daily. [name], MD Taking Active   atorvastatin (LIPITOR) 20 MG tablet [id number] No TAKE ONE TABLET BY MOUTH ONE TIME DAILY [name], MD Taking Active   blood glucose meter kit and supplies [id number] No Dispense based on patient and insurance preference. Use up to four times daily as directed. (FOR ICD-10 E10.9, E11.9). [name], MD Taking Active Multiple Informants           Med Note Blanch Media, CYNTHIA A   Tue Dec 22, 2020 10:03 AM) use  blood glucose meter kit and supplies KIT [id number] No Dispense based on patient  and insurance preference. Use up to four times daily as directed. [name], MD Taking Active Multiple Informants           Med Note Blanch Media, CYNTHIA A   Tue Dec 22, 2020 10:03 AM) use  glipiZIDE (GLUCOTROL) 10 MG tablet [id number] No Take 1 tablet (10 mg total) by mouth daily before breakfast. Takes 1 more tablet if blood sugar is high  Patient taking differently: Take 10 mg by mouth 2 (two) times daily with a meal. Takes 1 more tablet if blood sugar is high   Sagardia, Ines Bloomer, MD Taking Active   glucose blood (ACCU-CHEK GUIDE) test strip [id number] No Use as instructed [name], MD Taking Active Multiple Informants           Med Note Alfredia Ferguson A   Tue Dec 22, 2020 10:03 AM) use  metFORMIN (GLUCOPHAGE) 1000 MG tablet [id number]  TAKE ONE TABLET BY MOUTH TWICE A DAY WITH A MEAL Sagardia, Ines Bloomer, MD  Active   methocarbamol (ROBAXIN) 500 MG tablet [id number] No Take 1 tablet (500 mg total) by mouth 2 (two) times daily. Blanchie Dessert, MD Taking Active   ondansetron [facility] ODT) 4 MG disintegrating tablet [id number] No 47m ODT q4 hours prn nausea/vomit FDeno Etienne DO Taking Active   pantoprazole (PROTONIX) 40 MG tablet [id number] Take 1 tablet (40 mg total) by mouth daily. SHorald Pollen MD Taking Expired 06/22/21 2359   SYMBICORT 160-4.5 MCG/ACT inhaler [id number] INHALE TWO PUFFS BY MOUTH TWICE A DAY SHorald Pollen MD  Active   Ubrogepant (UBRELVY) 100 MG TABS [id number] Take 1 tablet by mouth daily. JJanith Lima MD Taking Active             Patient Active Problem List   Diagnosis Date Noted   Neck pain on left side 05/26/2021   Intractable persistent migraine aura without cerebral infarction and with status migrainosus 05/26/2021   Hypertension associated with diabetes ([place] 09/21/2020   Other dysphagia    Hyperglycemia 09/13/2020   Uncontrolled type 2 diabetes mellitus with hyperglycemia ([place] 09/13/2020   CAD (coronary  artery disease) 01/23/2020   Hyperlipidemia 11/02/2019   History of MI (myocardial infarction) 11/02/2019   Nonspecific chest pain 11/01/2019   Cigarette smoker 08/15/2018   COPD ? GOLD III/ active smoker 08/14/2018   History of diet-controlled diabetes 06/30/2018   COPD (chronic obstructive pulmonary disease) ([place]) 10/14/2014   Type 2 diabetes mellitus with hyperglycemia, without long-term current use of insulin ([place]) 10/14/2014    Immunization History  Administered Date(s) Administered   Fluad Quad(high Dose 65+) 03/24/2021   Influenza,inj,Quad PF,6+ Mos 09/21/2015, 07/02/2018   Influenza-Unspecified 08/03/2020   PFIZER(Purple Top)SARS-COV-2 Vaccination 09/29/2019, 06/12/2020, 10/20/2020   Pneumococcal Conjugate-13 09/21/2020   Pneumococcal Polysaccharide-23 10/15/2014, 09/21/2015    Conditions to be addressed/monitored:  Hypertension, Hyperlipidemia, Diabetes, GERD, and COPD  Care Plan : [place]  Updates made by Tomasa Blase, [place] since 07/13/2021 12:00 AM     Problem: HTN, CAD, HLD, COPD, DM2, GERD   Priority: High  Onset Date: 04/09/2021     Long-Range Goal: Disease Management   Start Date: 04/09/2021  Expected End Date: 10/08/2021  This Visit's Progress: On track  Recent Progress: Not on track  Priority: High  Note:    Current Barriers:  Unable to independently afford treatment regimen Unable to independently monitor therapeutic efficacy Unable to maintain control of DM and COPD  Pharmacist Clinical Goal(s):  Patient will verbalize ability to afford treatment regimen achieve adherence to monitoring guidelines and medication adherence to achieve therapeutic efficacy achieve improvement in COPD and DM as evidenced by decreased rescue inhaler use / more stable BG through collaboration with PharmD and provider.   Interventions: 1:1 collaboration with [name], MD regarding development and update of comprehensive plan of care as evidenced by  provider attestation and co-signature Inter-disciplinary care team collaboration (see longitudinal plan of care) Comprehensive medication review performed; medication list updated in electronic medical record  Hypertension (BP goal <130/80) -Controlled -Current treatment: Amlodipine 70m - 1 tablet daily  -Medications previously tried: n/a  -Current home readings: n/a BP Readings from Last 3 Encounters:  05/26/21 132/78  04/14/21 132/76  03/25/21 120/75  -Current dietary habits: notes to adding salt to his food, does not watch sodium intake  -Current exercise habits: walks daily - mostly around/ inside the house  -Denies hypotensive/hypertensive symptoms -Educated on BP goals and benefits of medications for prevention of heart attack, stroke and kidney damage; Daily salt intake goal < 2300 mg; Exercise goal of 150 minutes per week; Importance of home blood pressure monitoring; Proper BP monitoring technique; -Counseled on diet and exercise extensively Recommended to continue current medication  Hyperlipidemia: (LDL goal < 70) -Not ideally controlled Lab Results  Component Value Date   LDLCALC 102 (H) 06/22/2020  -Current treatment: Atorvastatin 251m- 1 tablet daily  -Medications previously tried: n/a  -Current dietary patterns: fried foods on occasion - usually will eat fried chicken with rice and beans  -Current exercise habits: walks daily - mostly around/ inside the house  -Educated on Cholesterol goals;  Benefits of statin for ASCVD risk reduction; Importance of limiting foods high in cholesterol; Exercise goal of 150 minutes per week; -Counseled on diet and exercise extensively -will plan to discuss increase in atorvastatin dosing with next visit   Diabetes (A1c goal <7%) -Improving - able to start trulicity  Lab Results  Component Value Date   HGBA1C 7.6 (A) 03/24/2021  -Current medications: Glipizide 1064m 1 tablet daily  Metformin 1000m72m1 tablet twice  daily  Tresiba - 10  units daily  Trulicity 0.35DH weekly  -Medications previously tried: lantus, novolog  -Current home glucose readings fasting glucose: 100-120 -Reports hypoglycemic/hyperglycemic symptoms - occasionally can have overnight hypoglycemia - has dropped as low as 36  - corrected by drinking soda and eating a sandwich  -Current meal patterns:  breakfast: oatmeal, small sandwich, coffee  lunch: vegetables with fish  dinner: rice/ beans wit meat  snacks: small sandwich, plantains, fruit  drinks: diet soda  -Current exercise: walks daily - mostly around/ inside the house  -Educated on A1c and blood sugar goals; Complications of diabetes including kidney damage, retinal damage, and cardiovascular disease; Exercise goal of 150 minutes per week; Benefits of weight loss; Proper insulin injection technique; Prevention and management of hypoglycemic episodes; Benefits of routine self-monitoring of blood sugar; Continuous glucose monitoring; Carbohydrate counting and/or plate method -Counseled to check feet daily and get yearly eye exams -Counseled on diet and exercise extensively Recommended for patient to stop tresiba - patient to increase glipizide to 28m BID with meals - likely will plan to increase trulicity in future in an effort to control BG without SU use - has only been on trulicity x 5 weeks, will discuss increase with next visit - patient to continue monitoring BG and reach out should BG > 150  COPD (Goal: control symptoms and prevent exacerbations) -Not ideally controlled -Current treatment  Symbicort 160-4.527m/act - 2 puffs twice daily  Albuterol 10839mact - 2 puffs every 4 hours as needed  - using every 4 hours  Albuterol 0.0083% nebulizer solution - 1 vial every 6 hours as needed - using 2-4 times daily  -Medications previously tried: n/a  -Gold Grade: Gold 3 (FEV1 30-49%) -Current COPD Classification:  D (high sx, >/=2 exacerbations/yr) -MMRC/CAT score: not  available -Pulmonary function testing: 08/14/2018 -Exacerbations requiring treatment in last 6 months: 2 -Patient reports consistent use of maintenance inhaler -Frequency of rescue inhaler use: 6 times daily, nebulizer use 2-4 times daily  -Counseled on Proper inhaler technique; Benefits of consistent maintenance inhaler use When to use rescue inhaler Differences between maintenance and rescue inhalers -Recommended for increase in maintenance intensity - recommending breztri - 2 puffs twice daily - patient assistance pending - patient assistance update - not approved as of last check as patient may qualify for medicaid, attempted to call patient to discuss but no answer x 2   GERD (Goal: Prevention / control of acid reflux) -Controlled -Current treatment  Pantoprazole 56m62m1 tablet daily  -Medications previously tried: n/a  -Counseled on diet and exercise extensively Recommended to continue current medication  Patient Goals/Self-Care Activities Patient will:  - take medications as prescribed check glucose at least 3 times daily, document, and provide at future appointments collaborate with provider on medication access solutions target a minimum of 150 minutes of moderate intensity exercise weekly engage in dietary modifications by reducing sodium intake / moderation of carbohydrate intake  Follow Up Plan: Telephone follow up appointment with care management team member scheduled for: 1 month The patient has been provided with contact information for the care management team and has been advised to call with any health related questions or concerns.           Medication Assistance: Application for trulicty and breztri  medication assistance program. in process.  Anticipated assistance start date 05/10/2021.  See plan of care for additional detail.  Patient's preferred pharmacy is:  [id number]  [address] [place] [address] Phone: [phone] Fax: [phone]  [facility] [place], [place] [address] Phone: [phone] Fax: [phone]  [place], [place] [address] [place] [address] Phone: [phone] Fax: [phone]  [place], [place] [address] [place] [address] Phone: [phone] Fax: [phone]   Uses pill box? Yes Pt endorses 100% compliance  Care Plan and Follow Up Patient Decision:  Patient agrees to Care Plan and Follow-up.  Plan: Telephone follow up appointment with care management team member scheduled for:  1 month and The patient has been provided with contact information for the care management team and has been advised to call with any health related questions or concerns.   Tomasa Blase, PharmD Clinical Pharmacist, [facility]

## 2021-06-17 DIAGNOSIS — Z6825 Body mass index (BMI) 25.0-25.9, adult: Secondary | ICD-10-CM | POA: Diagnosis not present

## 2021-06-17 DIAGNOSIS — I671 Cerebral aneurysm, nonruptured: Secondary | ICD-10-CM | POA: Diagnosis not present

## 2021-06-17 DIAGNOSIS — R030 Elevated blood-pressure reading, without diagnosis of hypertension: Secondary | ICD-10-CM | POA: Diagnosis not present

## 2021-06-20 IMAGING — CT CT ANGIO CHEST
2 of 6 series · 18 of 36 positions shown · IV contrast (omnipaque)
Comparison: Cardiac CT 09/15/2020

CLINICAL DATA: Cough, chest pain and hemoptysis for 4 days.

EXAM:
CT ANGIOGRAPHY CHEST WITH CONTRAST
TECHNIQUE: Multidetector CT imaging of the chest was performed using the
standard protocol during bolus administration of intravenous
contrast. Multiplanar CT image reconstructions and MIPs were
obtained to evaluate the vascular anatomy.
CONTRAST:  75mL OMNIPAQUE IOHEXOL 350 MG/ML SOLN

[Series 5: thins · axial · 0.71mm/px · z∈[-150,+142]mm · 17 of 330 slices shown]
[im 19/330  lung]
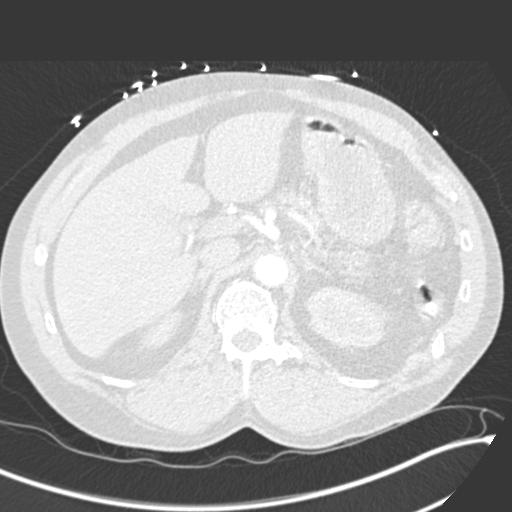
[im 37/330  mediastinal]
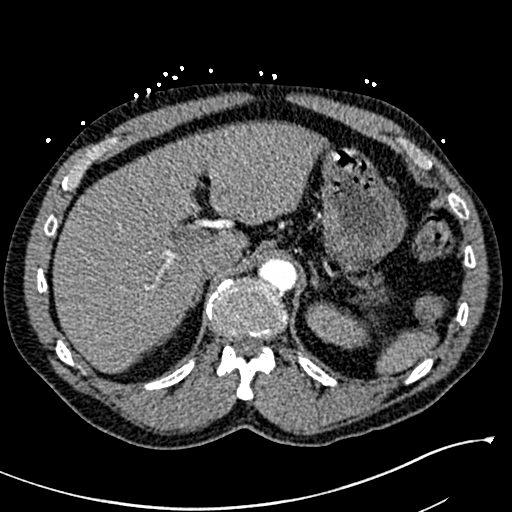
[im 55/330  lung]
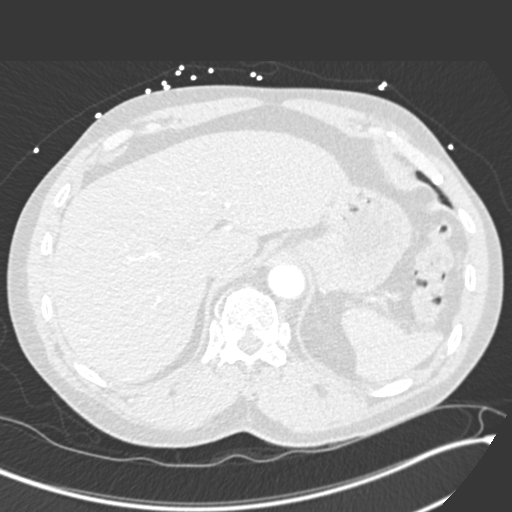
[im 74/330  mediastinal]
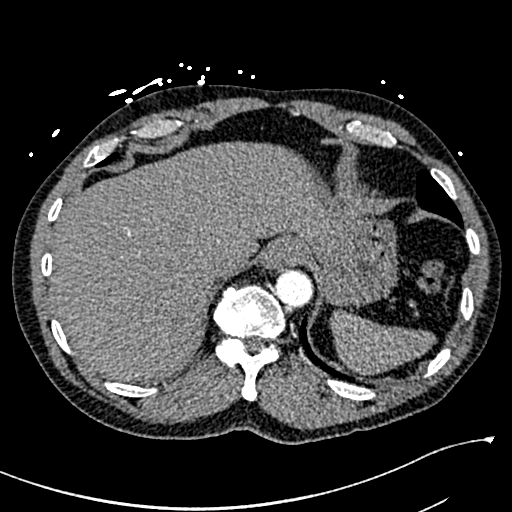
[im 92/330  lung]
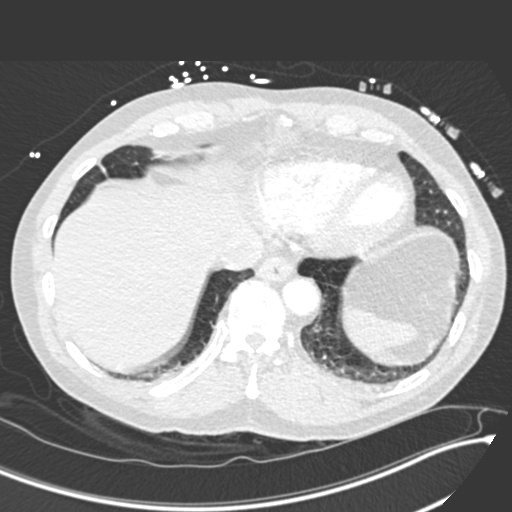
[im 110/330  mediastinal]
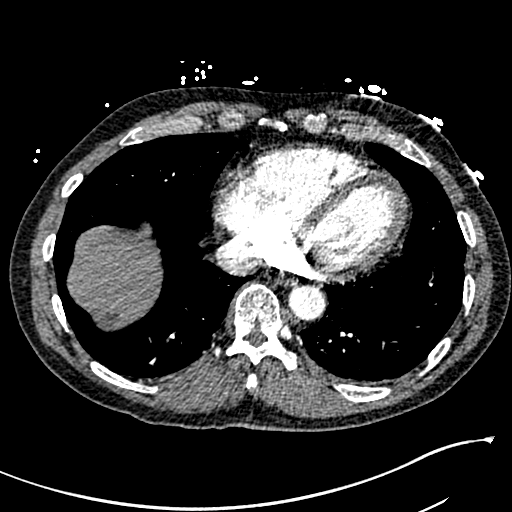
[im 128/330  lung]
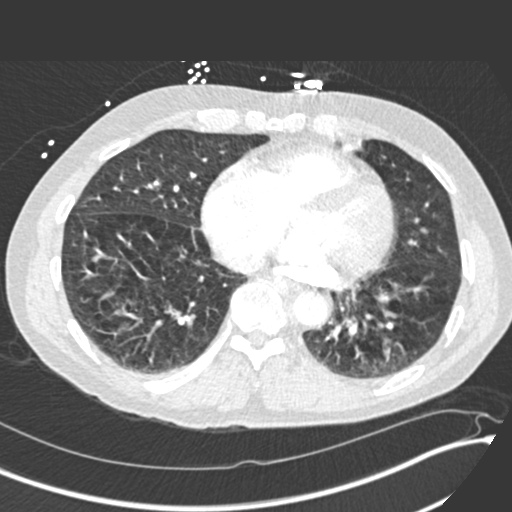
[im 147/330  mediastinal]
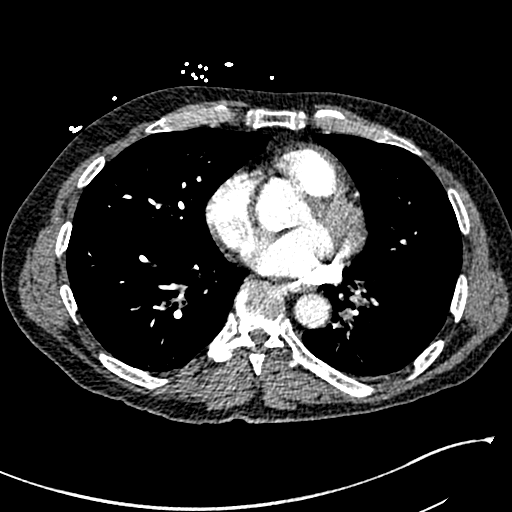
[im 165/330  lung]
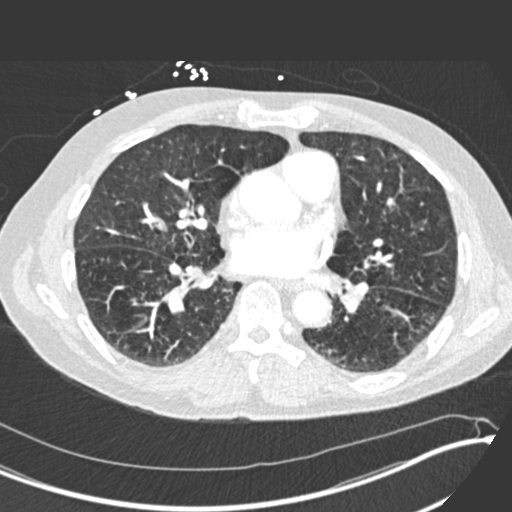
[im 183/330  mediastinal]
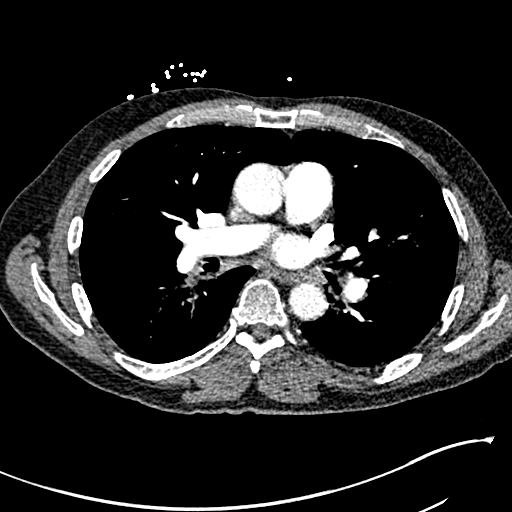
[im 202/330  lung]
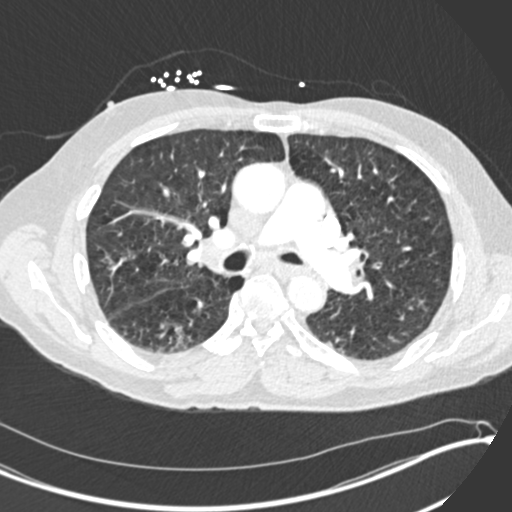
[im 220/330  mediastinal]
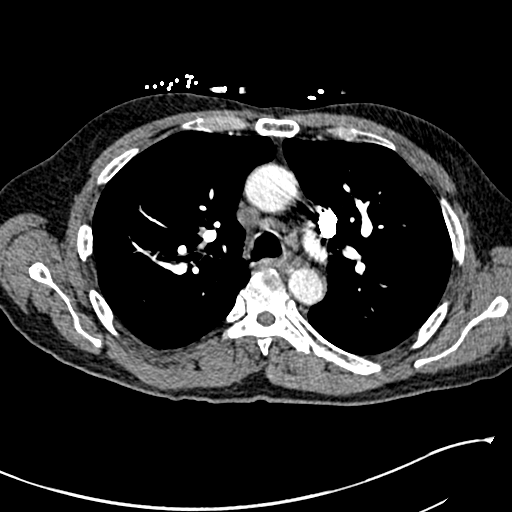
[im 238/330  lung]
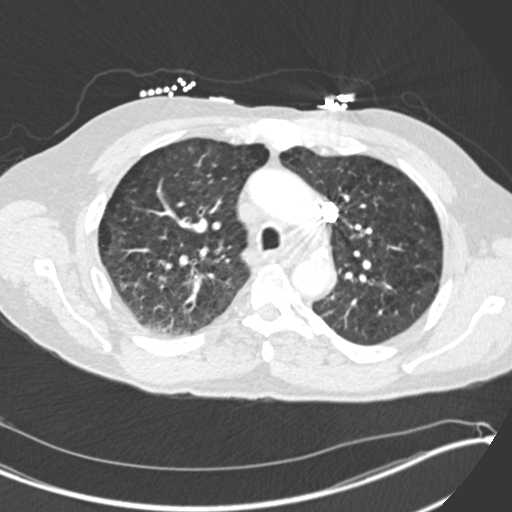
[im 256/330  mediastinal]
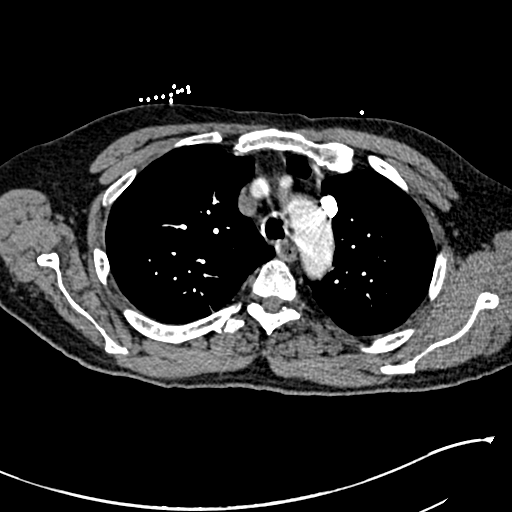
[im 275/330  lung]
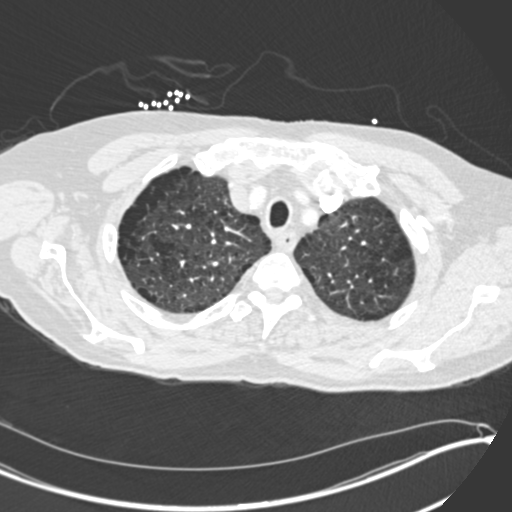
[im 293/330  mediastinal]
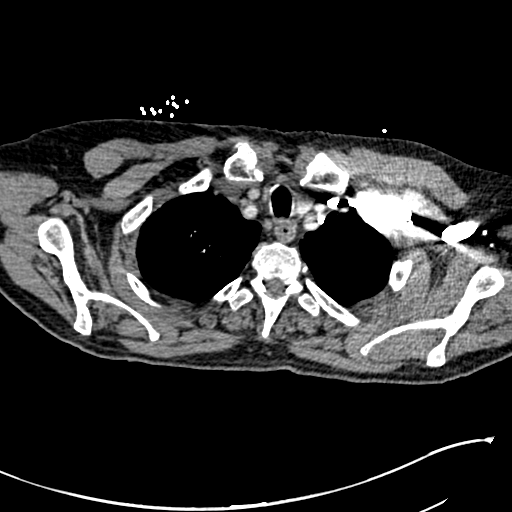
[im 311/330  lung]
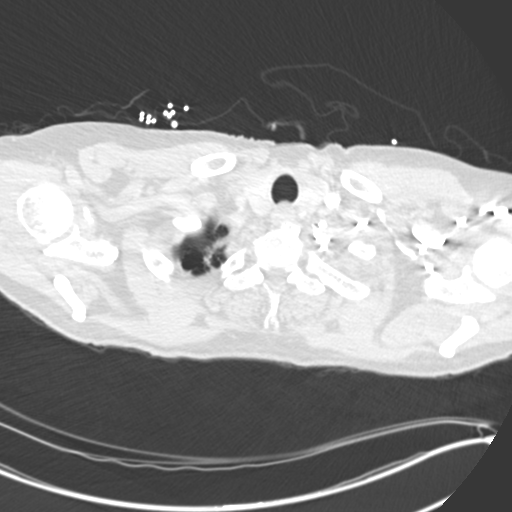

[Series 7: coronal mpr · coronal · 0.67mm/px · 1 of 135 slices shown]
[im 68/135  mediastinal]
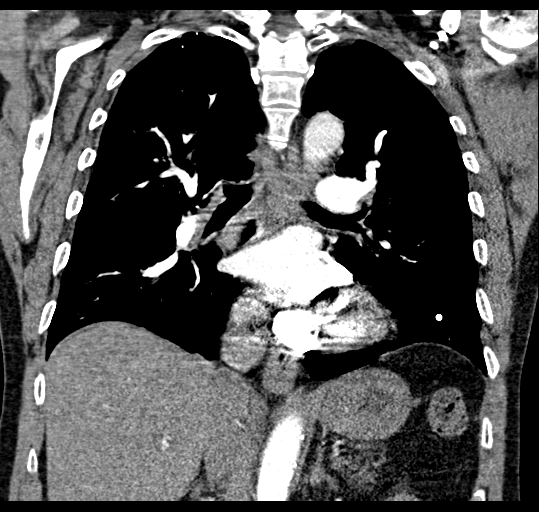

[18 of 36 positions shown; findings below may reference images not displayed]

FINDINGS: Cardiovascular: The heart is normal in size. No pericardial
effusion. The aorta is normal in caliber. No dissection.

The contrast is coming in through the left subclavian vein and the
patient has a duplicated SVC. The contrast comes down the left-sided
SVC and into the right atrium.

The pulmonary arterial tree and is well opacified. No filling
defects to suggest pulmonary embolism.

Mediastinum/Nodes: Stable scattered mediastinal and hilar lymph
nodes but no mass or overt adenopathy. Findings likely due to the
underlying lung disease. The esophagus is grossly normal.

Lungs/Pleura: Underlining significant emphysematous changes with
apical bulla and associated pulmonary scarring changes.

Areas of moderate bronchial wall thickening most notably in the
lower lobes bilaterally likely reflecting bronchitis. No focal
infiltrates or pulmonary edema.

Some patchy areas of tree-in-bud type findings most notably in the
superior segment of the right lower lobe suggesting chronic
inflammation or atypical infection.

No pleural effusions. No worrisome pulmonary lesions or pulmonary
nodules.

Upper Abdomen: No significant upper abdominal findings.

Musculoskeletal: No significant bony findings.

Review of the MIP images confirms the above findings.
IMPRESSION: 1. No CT findings for pulmonary embolism.
2. Normal thoracic aorta.
3. Duplicated SVC.
4. Underlying significant emphysematous changes and pulmonary
scarring changes.
5. Bronchial wall thickening suggesting bronchitis and patchy areas
of tree-in-bud type findings most notably in the superior segment of
the right lower lobe suggesting chronic inflammation or atypical
infection.

Emphysema ([id number]).

## 2021-06-26 ENCOUNTER — Other Ambulatory Visit: Payer: Self-pay | Admitting: Emergency Medicine

## 2021-06-26 DIAGNOSIS — J449 Chronic obstructive pulmonary disease, unspecified: Secondary | ICD-10-CM

## 2021-06-26 DIAGNOSIS — E1165 Type 2 diabetes mellitus with hyperglycemia: Secondary | ICD-10-CM

## 2021-06-30 ENCOUNTER — Encounter: Payer: Self-pay | Admitting: *Deleted

## 2021-06-30 ENCOUNTER — Ambulatory Visit: Payer: Medicare HMO | Admitting: *Deleted

## 2021-06-30 DIAGNOSIS — E1165 Type 2 diabetes mellitus with hyperglycemia: Secondary | ICD-10-CM

## 2021-06-30 DIAGNOSIS — J449 Chronic obstructive pulmonary disease, unspecified: Secondary | ICD-10-CM

## 2021-06-30 NOTE — Patient Instructions (Signed)
Visit [place], thank you for taking time to talk with me today. Please don't hesitate to contact me if I can be of assistance to you before our next scheduled telephone appointment.  Below are the goals we discussed today:  Patient Self-Care Activities: Patient Duane Cuevas will: Take medications as prescribed Attend all scheduled provider appointments Call pharmacy for medication refills Call provider office for new concerns or questions Continue to check fasting (first thing in the morning, before eating) blood sugars at home Try to begin to follow heart healthy, low salt, low cholesterol, carbohydrate-modified, low sugar diet: I have attached information about this diet Continue to take effort to prevent falls at home If you believe your symptoms from having COVID are getting worse: go to an Urgent [place] or the Emergency Department at your local hospital so that you can receive a medical screening exam and any treatment that you may need: I have attached information about COVID for you top review               Our next scheduled telephone follow up visit/ appointment with care management team member is scheduled on:  Tuesday, September 07, 2021 at 9:45 am  If you need to cancel or re-schedule our visit, please call [phone] and our care guide team will be happy to assist you.   I look forward to hearing about your progress.   [name], RN, BSN, [place] [phone]: direct office  If you are experiencing a Mental Health or [place] or need someone to talk to, please  call the Suicide and Crisis Lifeline: 988 call the [place] National Suicide Prevention Lifeline: 830-875-8745 or TTY: 206-700-9760 TTY (516)019-3142) to talk to a trained counselor call 1-800-273-TALK (toll free, 24 hour hotline) go to [facility] [place]  [phone]) call 911   The patient verbalized understanding of instructions, educational materials, and care plan provided today and agreed to receive a mailed copy of patient instructions, educational materials, and care plan  Informacin de la visita  ngel, gracias por tomarte el tiempo de hablar conmigo hoy. No dude en comunicarse conmigo si puedo ayudarlo antes de nuestra prxima cita telefnica programada.  A continuacin se muestran los [employer] que discutimos hoy: Actividades de autocuidado del paciente: El paciente Duane Cuevas:  Tome los medicamentos segn lo prescrito  Asistir a todas las citas programadas con proveedores  Llame a la farmacia para Financial trader  Llame a la oficina del proveedor si tiene nuevas inquietudes o preguntas  Contine controlando el azcar en la Naval architect (a primera hora de la maana, antes de comer) en casa  Trate de comenzar a seguir una dieta saludable para el corazn, baja en sal, baja en colesterol, modificada en carbohidratos y baja en azcar: Adjunto informacin sobre Ship broker.  Contine esforzndose para prevenir cadas en el hogar  Si cree que sus sntomas por tener COVID estn empeorando: vaya a un [facility] o al [employer] de Emergencias de su hospital local para que pueda recibir un examen mdico de deteccin y cualquier tratamiento que pueda necesitar: Adjunto informacin sobre COVID para tu mejor revisin  Doctor, general practice prxima cita/visita de seguimiento telefnica programada con un miembro del equipo de administracin de la atencin est programada para el martes 7 de [place] de 2023 a las 9:45 a. m.  Si necesita cancelar o reprogramar nuestra visita, llame al  [phone] y [name] equipo de guas de atencin estar encantado de ayudarle.  Espero escuchar acerca de su progreso.  [name], RN, BSN, [facility] RN Care Coordination- [place] [phone]: [place]  directa  Si est experimentando Ardelia Mems crisis de salud mental o de salud del comportamiento o necesita hablar con alguien, por favor  llamar a la lnea de vida de crisis y suicidio: [phone]  llamar a [place] de Prevencin del Suicidio de EE. UU.: [phone] o [phone] TTY [phone]) para hablar con un consejero capacitado  llame al 1-800-273-TALK (lnea directa gratuita las 24 horas)  dirjase a Atencin de [place] de salud conductual del condado de [place] [address], [place] [phone])  llama al 911   El paciente verbaliz la comprensin de las instrucciones, los materiales educativos y Theatre manager de atencin proporcionados hoy y acept recibir por correo una copia de las instrucciones para el [place], los materiales educativos y Theatre manager de atencin. More about Visit [place], thank you for taking time to talk with me today. Please don't hesitate to contact me if I can be of assistance to you before our next scheduled telephone appointment. Below are the goals we discussed today: Patient Self-Care Activities: Patient Duane Cuevas will:  Take medications as prescribed  Attend all scheduled provider appointments  Call pharmacy for medication refills  Call provider office for new concerns or questions  Continue to check fasting (first thing in the morning, before eating) blood sugars at home  Try to begin to follow heart healthy, low salt, low cholesterol, carbohydrate-modified, low sugar diet: I have attached information about this diet  Continue to take effort to prevent falls at home  If you believe your symptoms from having COVID are getting worse: go to an Urgent [place] or the Emergency Department at your local hospital so that you can receive a medical screening exam and any treatment that you may need: I have attached information about COVID for you top review Our next scheduled telephone follow up visit/ appointment with care management team member is  scheduled on: Tuesday, September 07, 2021 at 9:45 am If you need to cancel or re-schedule our visit, please call [phone] and our care guide team will be happy to assist you.   I look forward to hearing about your progress.   [name], RN, BSN, [facility] RN Care Coordination- [place] [phone]: direct office If you are experiencing a Mental Health or [place] or need someone to talk to, please  call the Suicide and Crisis Lifeline: 988  call the [place] National Suicide Prevention Lifeline: 825-072-9687 or TTY: 415-883-7808 TTY 2101867385) to talk to a trained counselor  call 1-800-273-TALK (toll free, 24 hour hotline)  go to [facility] [address], [place] [phone])  call 911  The patient verbalized understanding of instructions, educational materials, and care plan provided today and agreed to receive a mailed copy of patient instructions, educational materials, and care plan  COVID-19: Qu hacer si est enfermo COVID-19: What to Do if You Are Sick Si obtiene un resultado positivo en la prueba y es un adulto mayor o alguien que tiene un alto riesgo de enfermarse de gravedad a causa del COVID-19, puede haber tratamiento disponible. Comunquese de inmediato con un mdico despus de una prueba con resultado positivo para determinar si es elegible para recibir tratamiento, incluso si sus sntomas son leves en  ese momento. Tambin puede acudir a Doctor, general practice de prueba de deteccin y tratamiento (Test to Treat) y, si es elegible, recibir una receta de un mdico. No se demore: El tratamiento debe iniciarse en los primeros das para que sea [place]. Si tiene [place], tos u otros sntomas, podra tener COVID-19. [place] [employer] de las personas tiene una enfermedad leve y puede Fish farm manager. Si est enfermo: Lleve un registro de los sntomas. Si tiene una seal de advertencia de emergencia (que incluye  dificultad para respirar), llame al 911. Pasos para ayudar a Manufacturing engineer del COVID-19 si est enfermo Si tiene COVID-19 o piensa que podra [employer], siga los pasos a continuacin para cuidarse y Air traffic controller a Museum/gallery curator a Producer, television/film/video de su hogar y su comunidad. Qudese en su casa, excepto para obtener atencin mdica [employer]. La [employer] de las personas con COVID-19 tiene una enfermedad leve y puede recuperarse en el hogar sin atencin mdica. No salga de su casa, excepto para recibir atencin mdica. No vaya a lugares pblicos ni a otros lugares donde no Community education officer. Cudese. Descanse y mantngase hidratado. Tome medicamentos de venta libre, como paracetamol, para sentirse mejor. Mantngase en contacto con su mdico. Llame antes de recibir atencin mdica. Asegrese de recibir atencin si tiene dificultad para Ambulance person, o si tiene algn otro signo de advertencia de emergencia, o si cree que se trata de Engineer, maintenance (IT). Evite el transporte pblico, compartir vehculos o tomar taxis, si es posible. Somtase a pruebas de Programme researcher, broadcasting/film/video Si tiene sntomas de COVID-19, somtase a pruebas de deteccin. Mientras espera los resultados de las pruebas, aljese de otras personas, incluso mantngase apartado de las personas que viven en su hogar. Hgase las pruebas lo antes posible despus de que comiencen sus sntomas. Puede haber tratamientos disponibles para personas con COVID-19 que estn en riesgo de enfermar gravemente. No se demore: El tratamiento debe iniciarse de forma temprana para que sea eficaz; algunos tratamientos deben comenzar en el lapso de los 5 das siguientes a los primeros sntomas. Si el resultado de su prueba es positivo, comunquese de inmediato con su mdico para determinar si es elegible. Las pruebas de autodiagnstico son Ardelia Mems de las diversas opciones para Optometrist pruebas de deteccin del virus que causa el COVID-19 y pueden ser ms convenientes que las  pruebas de laboratorio y las pruebas en los puntos de atencin. Consulte a su mdico o al departamento de salud local si necesita ayuda para interpretar los [employer]. Puede visitar el sitio web de su departamento de Hotel manager, tribal, local y territorial para Animator informacin local ms reciente sobre las pruebas de Programme researcher, broadcasting/film/video. Seprese de [employer] Pacific En la mayor medida posible, permanezca en una habitacin especfica y alejado de otras personas y Herbalist. Si es posible, use un bao aparte. Si tiene que Agricultural consultant de otras personas o animales dentro o fuera de la casa, use una mascarilla bien [place]. Dgales a sus contactos estrechos que pueden haber estado expuestos al COVID-19. Ardelia Mems persona infectada puede propagar el COVID-19 a Proofreader de 68 horas (o 2 das) antes de que la persona presente cualquier sntoma o tenga un resultado positivo en la prueba de deteccin. Al informar a sus contactos estrechos que pueden haber estado expuestos al COVID-19, est ayudando a proteger a todos. Consulte COVID-19 y los animales si tiene preguntas [employer]. Si se le diagnostica COVID-19, alguien del departamento de Chartered certified accountant. Responda la  llamada para ralentizar la propagacin. Controle sus sntomas Los sntomas de COVID-19 incluyen [place], tos u otros sntomas. Siga las instrucciones del mdico y del departamento de salud local. Las autoridades de salud locales podrn darle instrucciones para Chief Technology Officer sus sntomas y Doctor, hospital informacin. Cundo solicitar atencin mdica de emergencia Est atento a los signos de advertencia de emergencia* para el COVID-19. Si alguien [place] alguno de estos signos, solicite atencin mdica de Freight forwarder de inmediato: Building services engineer u opresin persistentes en el pecho Confusin nueva Incapacidad de despertarse o permanecer despierto Piel, labios o lechos de las uas plidos o de color grisceo o  [place], segn el tono de la piel *En esta lista no estn todos los sntomas posibles. Llame al mdico si tiene otros sntomas que sean graves o le preocupen. Llame al 911 o llame con anticipacin al centro de emergencias de su localidad: Informe al operador que est buscando atencin para alguien que tiene o puede tener COVID-19. Llame con anticipacin antes de visitar al mdico Llame con anticipacin. Muchas visitas mdicas para la atencin de rutina se estn posponiendo o se realizan por telfono o telemedicina. Si tiene una cita mdica que no se puede posponer, llame al [employer] del mdico e informe que tiene o puede tener COVID-19. Esto ayudar a que quienes trabajan en el consultorio puedan protegerse y Museum/gallery curator a Information systems manager. Si est enfermo, use una Loss adjuster, chartered Debe usar una mascarilla si debe estar cerca de otras personas o [place], incluidas las mascotas (incluso en su casa). Lleve una mascarilla con el mejor ajuste, proteccin y comodidad para usted. No es necesario que use la mascarilla si est solo. Si no puede ponerse Geographical information systems officer (debido a que tiene dificultad para Ambulance person, por ejemplo), cbrase de algn otro modo cuando tosa o estornude. Trate de mantenerse al menos a una distancia de 6 pies (1.8 m) de las Comptroller. Esto ayudar a proteger a las personas que lo rodean. Los nios menores de 2 aos de edad, las personas con problemas para Ambulance person o las personas que no pueden quitarse las mascarillas sin ayuda no deben Cytogeneticist. Cbrase al toser y estornudar Cbrase la boca y la nariz con un pauelo descartable cuando tosa o estornude. Arroje los pauelos descartables usados en un cesto de basura que tenga [place]. Inmediatamente, lvese las manos con agua y jabn durante al menos 20 segundos. Si no dispone de agua y [place], Google las manos con un desinfectante de manos a base de alcohol que contenga al menos un 60 % de alcohol. Lmpiese las  manos con frecuencia Lvese las manos frecuentemente con agua y jabn durante al menos 20 segundos. Esto es especialmente importante despus de sonarse la nariz, toser o estornudar, ir al bao y antes de comer o preparar alimentos. Utilice un desinfectante para manos si no dispone de [place] y [place]. Use un desinfectante para manos a base de alcohol con al menos un 60 % de alcohol, cubrindose todas las superficies de las manos y frotndoselas hasta que se sientan secas. Usar agua y jabn es la mejor opcin, especialmente si las manos estn sucias. No se toque los ojos, la nariz ni la boca sin antes lavarse las manos. Consejos para el lavado de manos Evite compartir artculos domsticos personales No comparta platos, vasos, tazas, utensilios para comer, toallas ni ropa de cama con otras personas en su casa. Despus de [employer], lvelos bien con agua y jabn o pngalos en el lavavajillas. Limpie todas las  superficies de su casa Limpie y desinfecte las superficies de alto contacto (por ejemplo, picaportes de [place], mesas, [place], interruptores de luz y encimeras) en su habitacin de enfermo y bao. En espacios compartidos, debe limpiar y [place] y los artculos despus de que los use la persona enferma. Si est enfermo y no puede limpiar, un cuidador u otra persona debe limpiar y Education officer, environmental solo el lugar que le rodea (como su dormitorio y bao) segn sea necesario. Su cuidador/otra persona debe Sales promotion account executive posible (al menos varias horas) y llevar mascarilla antes de entrar, limpiar y desinfectar los espacios compartidos que usted use. Limpie y desinfecte las zonas que puedan tener [place], heces o lquidos corporales en su superficie. Use limpiadores y desinfectantes domsticos. Limpie las superficies con suciedad visible con limpiadores domsticos que contengan jabn o detergente. Luego, use un desinfectante de Fish farm manager. Use un producto de la lista N de  Conservation officer, historic buildings (EPA) (Agencia de Proteccin Ambiental): Desinfectantes para coronavirus ([id number]). No olvide seguir las instrucciones de la etiqueta para asegurarse de usar el producto de Geographical information systems officer segura y Armed forces logistics/support/administrative officer. Para muchos productos, se recomienda Consulting civil engineer superficie hmeda con un desinfectante durante un cierto perodo de tiempo (consulte el tiempo de Diplomatic Services operational officer en la etiqueta del producto). Es posible que tambin deba usar equipo de Engineer, maintenance personal, como guantes, dependiendo de las instrucciones de la etiqueta del producto. Inmediatamente despus de la desinfeccin, lvese las manos con agua y jabn durante 20 segundos. Para obtener orientacin completa sobre la limpieza y desinfeccin de su hogar, consulte la Orientacin para la desinfeccin [place]. Tome medidas para mejorar la ventilacin en su casa Mejore la ventilacin (el flujo de aire) en casa para ayudar a evitar el contagio del COVID-19 a Producer, television/film/video de Administrator, arts. Despeje las partculas del virus del COVID-19 en el aire abriendo las [place], utilizando filtros de aire y encendiendo los ventiladores de Administrator, arts. Use esta herramienta interactiva para aprender a mejorar el flujo de aire en su hogar. Cundo puede estar cerca de otras personas despus de tener COVID-19 Decidir cundo puede rodearse de otras personas es diferente para distintas situaciones. Averige cundo puede finalizar el aislamiento en casa de forma segura. Para cualquier pregunta adicional sobre su atencin, pngase en contacto con su mdico o con el departamento de salud local o estatal. 09/22/2020 Derl Barrow del contenido: [employer] for Immunization and Respiratory Diseases (NCIRD), Division of Viral Diseases ([place] de [place] y [place]) Esta informacin no tiene Marine scientist el consejo del mdico. Asegrese de hacerle al mdico cualquier pregunta que  tenga. Document Revised: 10/13/2020 Document Reviewed: 03/12/2021 Elsevier Patient Education  2022 [employer].

## 2021-06-30 NOTE — Chronic Care Management (AMB) (Signed)
Chronic Care Management   CCM RN Visit Note  06/30/2021 Name: Duane Cuevas MRN: [id number] DOB: 1955-10-06  Subjective: Duane Cuevas is a 65 y.o. year old male who is a primary care patient of Sagard[place], Ines Bloomer, MD. The care management team was consulted for assistance with disease management and care coordination needs.    Engaged with patient by telephone for follow up visit in response to provider referral for case management and/or care coordination services. Insurance account manager facilitated call using [place] interpreter, "[name]" [place] [phone]  Consent to Services:  The patient was given information about Chronic Care Management services, agreed to services, and gave verbal consent prior to initiation of services.  Please see initial visit note for detailed documentation.  Patient agreed to services and verbal consent obtained.   Assessment: Review of patient past medical history, allergies, medications, health status, including review of consultants reports, laboratory and other test data, was performed as part of comprehensive evaluation and provision of chronic care management services.   SDOH (Social Determinants of Health) assessments and interventions performed:  SDOH Interventions    Flowsheet Row Most Recent Value  SDOH Interventions   Food Insecurity Interventions Intervention Not Indicated  Transportation Interventions Intervention Not Indicated  [Patient reports his daughter continues to provide transportation]      CCM Care Plan No Known Allergies  Outpatient Encounter Medications as of 06/30/2021  Medication Sig Note   albuterol (PROVENTIL) (2.5 MG/3ML) 0.083% nebulizer solution USE 1 VIAL VIA NEBULIZER EVERY 6 HOURS AS NEEDED FOR WHEEZING OR SHORTNESS OF BREATH    albuterol (VENTOLIN HFA) 108 (90 Base) MCG/ACT inhaler Inhale 2 puffs into the lungs every 6 (six) hours as needed for wheezing or shortness of breath.    amLODipine (NORVASC) 5 MG  tablet Take 1 tablet (5 mg total) by mouth daily.    atorvastatin (LIPITOR) 20 MG tablet TAKE ONE TABLET BY MOUTH ONE TIME DAILY    blood glucose meter kit and supplies KIT Dispense based on patient and insurance preference. Use up to four times daily as directed. 12/22/2020: use   blood glucose meter kit and supplies Dispense based on patient and insurance preference. Use up to four times daily as directed. (FOR ICD-10 E10.9, E11.9). 12/22/2020: use   glipiZIDE (GLUCOTROL) 10 MG tablet Take 1 tablet (10 mg total) by mouth daily before breakfast. Takes 1 more tablet if blood sugar is high (Patient taking differently: Take 10 mg by mouth 2 (two) times daily with a meal. Takes 1 more tablet if blood sugar is high)    glucose blood (ACCU-CHEK GUIDE) test strip Use as instructed 12/22/2020: use   metFORMIN (GLUCOPHAGE) 1000 MG tablet TAKE ONE TABLET BY MOUTH TWICE A DAY WITH A MEAL    methocarbamol (ROBAXIN) 500 MG tablet Take 1 tablet (500 mg total) by mouth 2 (two) times daily.    ondansetron (ZOFRAN ODT) 4 MG disintegrating tablet 4mg  ODT q4 hours prn nausea/vomit    pantoprazole (PROTONIX) 40 MG tablet Take 1 tablet (40 mg total) by mouth daily.    SYMBICORT 160-4.5 MCG/ACT inhaler INHALE TWO PUFFS BY MOUTH TWICE A DAY    Ubrogepant (UBRELVY) 100 MG TABS Take 1 tablet by mouth daily.    Facility-Administered Encounter Medications as of 06/30/2021  Medication   morphine 2 MG/ML injection   Patient Active Problem List   Diagnosis Date Noted   Neck pain on left side 05/26/2021   Intractable persistent migraine aura without cerebral infarction and with status migrainosus  05/26/2021   Hypertension associated with diabetes ([place]) 09/21/2020   Other dysphagia    Hyperglycemia 09/13/2020   Uncontrolled type 2 diabetes mellitus with hyperglycemia ([place]) 09/13/2020   CAD (coronary artery disease) 01/23/2020   Hyperlipidemia 11/02/2019   History of MI (myocardial infarction) 11/02/2019   Nonspecific  chest pain 11/01/2019   Cigarette smoker 08/15/2018   COPD ? GOLD III/ active smoker 08/14/2018   History of diet-controlled diabetes 06/30/2018   COPD (chronic obstructive pulmonary disease) ([place]) 10/14/2014   Type 2 diabetes mellitus with hyperglycemia, without long-term current use of insulin ([place]) 10/14/2014   Conditions to be addressed/monitored:  COPD and DMII  Care Plan : COPD (Adult)  Updates made by Knox Royalty, RN since 06/30/2021 12:00 AM     Problem: Symptom Exacerbation (COPD)   Priority: Medium     Long-Range Goal: Symptom Exacerbation Prevented or Minimized   Start Date: 01/14/2021  Expected End Date: 01/14/2022  Recent Progress: On track  Priority: Medium  Note:    Case Manager Clinical Goal(s):  01/14/21: Over the next 12 months, patient will demonstrate improved adherence to prescribed treatment plan for COPD self care/management as evidenced by patient reporting during CCM RN CM outreach of: Utilizing pursed lip breathing for shortness of breath Verbalize understanding of COPD action plan and when to seek appropriate levels of medical care Verbalize basic understanding of COPD disease process and self care activities  Interventions:  Collaboration with [name], MD regarding development and update of comprehensive plan of care as evidenced by provider attestation and co-signature Inter-disciplinary care team collaboration (see longitudinal plan of care) Chart reviewed including relevant office notes, upcoming scheduled appointments, and lab results  06/30/21- Goal completed due to duplication of goals, progression of care plan with conversion to new format; goals re-established and extended today          Care Plan : Diabetes Type 2 (Adult)  Updates made by Knox Royalty, RN since 06/30/2021 12:00 AM     Problem: Glycemic Management (Diabetes, Type 2)   Priority: Medium     Long-Range Goal: Glycemic Management Optimized   Start  Date: 01/14/2021  Expected End Date: 01/14/2022  Recent Progress: On track  Priority: Medium  Note:   Case Manager Clinical Goal(s):  01/14/21: Over the next 12 months, patient will demonstrate improved adherence to prescribed treatment plan for diabetes self care/management as evidenced by patient reporting during CCM RN CM outreach of: Daily monitoring and recording of blood sugars at home 3-4 times per day Improved adherence to ADA/ carb modified, low sugar diet  Adherence to prescribed medication regimen  Contacting provider for new or worsened symptoms or questions Interventions:  Collaboration with [name], MD regarding development and update of comprehensive plan of care as evidenced by provider attestation and co-signature Inter-disciplinary care team collaboration (see longitudinal plan of care) Review of patient status, including review of consultants reports, relevant laboratory and other test results, and medications completed Discussed current clinical condition with patient, confirmed no current clinical concerns  06/30/21- Goal completed due to duplication of goals, progression of care plan with conversion to new format; goals re-established and extended today        Care Plan : [place] of Care  Updates made by Knox Royalty, RN since 06/30/2021 12:00 AM     Problem: Chronic Disease Management Needs   Priority: High     Long-Range Goal: Ongoing adherence/ progression of established plan of care  for long term chronic disease management   Start Date: 06/30/2021  Expected End Date: 06/30/2022  Priority: High  Note:   Current Barriers:  Chronic Disease Management support and education needs related to COPD and DMII Continues to smoke "1-2" cigarettes per day" Financial Constraints: reports difficulty affording food, medications, paying for utilities: [place] referral placed: confirmed 02/24/21 that [employer]  care guide has spoken with patient; 06/30/21- confirmed CCM Pharmacy team active/ involved in patient's ongoing chronic disease care needs Language barrier with limited health literacy: requires interpreter services and frequent repetition/ reinforcement-- 02/24/21: confirmed with patient that his daughter is no longer working: reports okay to contact English-speaking daughter [name], on [place] Unable to independently verbalize appropriate diet and self-health management strategies in setting of DM/ COPD: requires ongoing reinforcement/ support  RNCM Clinical Goal(s):  Patient will demonstrate improved health management independence as evidenced by adherence to established plan of care for self-health management of DMII/ COPD        through collaboration with RN Care manager, provider, and care team.   Interventions: 1:1 collaboration with primary care provider regarding development and update of comprehensive plan of care as evidenced by provider attestation and co-signature Inter-disciplinary care team collaboration (see longitudinal plan of care) Evaluation of current treatment plan related to  self management and patient's adherence to plan as established by provider Patient immediately reports he contracted COVID over Christmas: reports doing "some better;" approximate onset "about 3 days ago," had positive home test; states "no longer running fever;" but "has headache and body-aches;" patient asks if I will prescribe him medications to take-- informed patient I am not a doctor and do not prescribe medications: discussed his need for medical screening exam/ treatment if his symptoms worsen-- or if he believes he is not doing well now.  Options for treatment/ medical care discussed including: PCP urgent office visit; urgent care center; ED-- patient will consider; patient sounds to be in no distress/ has no cough/ no obvious signs respiratory distress throughout call today  Confirmed no new/  recent falls- patient denies need to use assistive devices; positive reinforcement provided with encouragement to continue efforts at fall prevention  COPD: (Status: Goal on Track (progressing): YES.) Long Term Goal  Reviewed medications with patient, including use of prescribed maintenance and rescue inhalers, and provided instruction on medication management and the importance of adherence Advised patient to track and manage COPD triggers Advised patient to self assesses COPD action plan zone and make appointment with provider if in the yellow zone for 48 hours without improvement Provided education about and advised patient to utilize infection prevention strategies to reduce risk of respiratory infection Discussed the importance of adequate rest and management of fatigue with COPD Assessed social determinant of health barriers Confirmed patient continues using inhalers: he is engaged with CCM Pharmacy team; denies needs/ questions about inhalers- states "they are really helping" with COVID symptoms  Diabetes:  (Status: Goal on Track (progressing): YES.) Long Term Goal   Lab Results  Component Value Date   HGBA1C 7.6 (A) 03/24/2021  Assessed patient's understanding of A1c goal: <7% Provided education to patient about basic DM disease process; Reviewed prescribed diet with patient heart healthy, low salt, carbohydrate modified, low sugar; Discussed plans with patient for ongoing care management follow up and provided patient with direct contact information for care management team;      Review of patient status, including review of consultants reports, relevant laboratory and other test results, and  medications completed;       Confirmed patient continues monitoring blood sugars at home: reports consistent recent values between 110-125; he is non-specific as to whether these are fasting or post-prandial values- just say "they all look good;" he denies recent low blood sugars, we reviewed  signs/ symptoms low blood sugars along with corresponding action plan Confirmed patient has stopped using insulin as instructed by CCM Pharmacy team-- he confirms that he is using weekly trulicity as prescribed Encouraged patient to consider ongoing efforts to make small changes to dietary habits/ routines  Patient Goals/Self-Care Activities: As evidenced by review of EHR, collaboration with care team, and patient reporting during CCM RN CM outreach,  Patient [place] will: Take medications as prescribed Attend all scheduled provider appointments Call pharmacy for medication refills Call provider office for new concerns or questions Continue to check fasting (first thing in the morning, before eating) blood sugars at home Try to begin to follow heart healthy, low salt, low cholesterol, carbohydrate-modified, low sugar diet: I have attached information about this diet Continue to take effort to prevent falls at home If you believe your symptoms from having COVID are getting worse: go to an Urgent [facility] or the Emergency Department at your local hospital so that you can receive a medical screening exam and any treatment that you may need: I have attached information about COVID for you top review                  Plan: Telephone follow up appointment with care management team member scheduled for:  Tuesday, September 07, 2021 at 9:45 am The patient has been provided with contact information for the care management team and has been advised to call with any health related questions or concerns  [name], RN, BSN, [place] [phone]: direct office

## 2021-07-03 DIAGNOSIS — J449 Chronic obstructive pulmonary disease, unspecified: Secondary | ICD-10-CM | POA: Diagnosis not present

## 2021-07-03 DIAGNOSIS — E1165 Type 2 diabetes mellitus with hyperglycemia: Secondary | ICD-10-CM

## 2021-07-15 ENCOUNTER — Ambulatory Visit (INDEPENDENT_AMBULATORY_CARE_PROVIDER_SITE_OTHER): Payer: Medicare HMO

## 2021-07-15 DIAGNOSIS — E1165 Type 2 diabetes mellitus with hyperglycemia: Secondary | ICD-10-CM

## 2021-07-15 DIAGNOSIS — J439 Emphysema, unspecified: Secondary | ICD-10-CM

## 2021-07-15 NOTE — Patient Instructions (Signed)
Visit Information  Following are the goals we discussed today:   Set My Target A1c   Timeframe:  Long-Range Goal Priority:  High Start Date:  04/09/2021                           Expected End Date:  07/10/2022                    Follow Up Date 08/10/2021   - set target A1C <7.0% - attain a more stable blood pressure throughout the day     Why is this important?   Your target A1C is decided together by you and your doctor.  It is based on several things like your age and other health issues.  Track and Manage My Triggers - COPD   Timeframe:  Long-Range Goal Priority:  High Start Date:  04/09/2021                           Expected End Date:  07/10/2022                     Follow Up Date 08/10/2021   - avoid second hand smoke - eliminate smoking in my home - identify and avoid work-related triggers - identify and remove indoor air pollutants - limit outdoor activity during cold weather    Why is this important?   Triggers are activities or things, like tobacco smoke or cold weather, that make your COPD (chronic obstructive pulmonary disease) flare-up.  Knowing these triggers helps you plan how to stay away from them.  When you cannot remove them, you can learn how to manage them.    Plan: Telephone follow up appointment with care management team member scheduled for:  1 month The patient has been provided with contact information for the care management team and has been advised to call with any health related questions or concerns.   Tomasa Blase, PharmD Clinical Pharmacist, [facility]   Please call the care guide team at [phone] if you need to cancel or reschedule your appointment.   Patient verbalizes understanding of instructions and care plan provided today and agrees to view in [place]. Active MyChart status confirmed with patient.

## 2021-07-15 NOTE — Progress Notes (Signed)
Chronic Care Management Pharmacy Note  07/15/2021 Name:  Duane Cuevas MRN:  [id number] DOB:  01/22/1956  Summary: - spoke with patient via translator service ID [phone] -Patient confirms that he has stopped using insulin, using trulicity, glipizide, and metformin as directed - BG controlled averaging 100-130's  -Had [place] over Christmas, is still recovering from illness, notes that breathing was more labored at times, not as much of an issue now, but still finds he is short of breath / fatigued at times  -Reports adherence to all other medications, denies any issues with current medications   Recommendations/Changes made from today's visit: -Recommending for patient to start Breztri 2 puff twice daily in place of symbicort - [facility] - patient has medicaid and this medication should be covered  -Also recommending for trulicity to be [facility] as patient has medicare and medicaid, this should be covered by pharmacy benefits  -Patient to have repeat lipid panel with next PCP appointment, increase statin intensity if not at goal  -Patient to continue to monitor BG daily - to reach out should BG control be lost prior to next appointment - will also monitor breathing and reach out should he have any issues with new inhaler   Subjective: Duane Cuevas is an 66 y.o. year old male who is a primary patient of Sagardia, Ines Bloomer, MD.  The CCM team was consulted for assistance with disease management and care coordination needs.    Engaged with patient by telephone for follow up visit in response to provider referral for pharmacy case management and/or care coordination services.   Consent to Services:  The patient was given the following information about Chronic Care Management services today, agreed to services, and gave verbal consent: 1. CCM service includes personalized support from designated clinical staff supervised by the primary care provider, including  individualized plan of care and coordination with other care providers 2. 24/7 contact phone numbers for assistance for urgent and routine care needs. 3. Service will only be billed when office clinical staff spend 20 minutes or more in a month to coordinate care. 4. Only one practitioner may furnish and bill the service in a calendar month. 5.The patient may stop CCM services at any time (effective at the end of the month) by phone call to the office staff. 6. The patient will be responsible for cost sharing (co-pay) of up to 20% of the service fee (after annual deductible is met). Patient agreed to services and consent obtained.  Patient Care Team: [name], MD as PCP - General (Internal Medicine) Knox Royalty, RN as Case Manager Courteny Egler, Darnelle Maffucci, [facility] as Pharmacist (Pharmacist)  Recent Office Visits: 05/26/2021 - Dr. Ronnald Ramp - 4 day history of left sided frontal headache - started on Ubrelvy    Recent consult visits:  06/17/2021 - [facility] visits:  None since last visit   Objective:  Lab Results  Component Value Date   CREATININE 0.67 04/14/2021   BUN 10 04/14/2021   GFRNONAA >60 04/14/2021   GFRAA 101 06/22/2020   NA 139 04/14/2021   K 3.6 04/14/2021   CALCIUM 9.6 04/14/2021   CO2 24 04/14/2021   GLUCOSE 144 (H) 04/14/2021    Lab Results  Component Value Date/Time   HGBA1C 7.6 (A) 03/24/2021 09:59 AM   HGBA1C 7.5 (A) 12/22/2020 10:14 AM   HGBA1C 8.9 (H) 04/28/2020 01:00 AM   HGBA1C 6.8 (H) 11/02/2019 05:58  AM    Last diabetic Eye exam:  Lab Results  Component Value Date/Time   HMDIABEYEEXA No Retinopathy 11/09/2020 02:50 PM    Last diabetic Foot exam:  No results found for: HMDIABFOOTEX   Lab Results  Component Value Date   CHOL 172 06/22/2020   HDL 48 06/22/2020   LDLCALC 102 (H) 06/22/2020   TRIG 126 06/22/2020   CHOLHDL 3.6 06/22/2020    Hepatic Function Latest Ref Rng & Units 10/21/2020 09/14/2020  09/10/2020  Total Protein 6.5 - 8.1 g/dL 6.9 6.7 7.7  Albumin 3.5 - 5.0 g/dL 3.6 3.9 4.3  AST 15 - 41 U/L _0 ALT 0 - 44 U/L 22 44 39  Alk Phosphatase 38 - 126 U/L 66 56 70  Total Bilirubin 0.3 - 1.2 mg/dL 0.9 0.6 0.4  Bilirubin, Direct 0.0 - 0.2 mg/dL - <0.1 <0.1    No results found for: TSH, FREET4  CBC Latest Ref Rng & Units 04/14/2021 03/25/2021 10/28/2020  WBC 4.0 - 10.5 K/uL 8.5 7.3 13.9(H)  Hemoglobin 13.0 - 17.0 g/dL 15.0 16.0 13.1  Hematocrit 39.0 - 52.0 % 44.5 47.8 39.5  Platelets 150 - 400 K/uL 255 248 245    No results found for: VD25OH  Clinical ASCVD: Yes  The ASCVD Risk score (Arnett DK, et al., 2019) failed to calculate for the following reasons:   The patient has a prior MI or stroke diagnosis    Depression screen [facility] 2/9 03/24/2021 01/14/2021 12/22/2020  Decreased Interest 0 0 0  Down, Depressed, Hopeless 0 0 0  PHQ - 2 Score 0 0 0   Social History   Tobacco Use  Smoking Status Every Day   Packs/day: 0.15   Years: 47.00   Pack years: 7.05   Types: Cigarettes  Smokeless Tobacco Never   BP Readings from Last 3 Encounters:  05/26/21 132/78  04/14/21 132/76  03/25/21 120/75   Pulse Readings from Last 3 Encounters:  05/26/21 95  04/14/21 89  03/25/21 93   Wt Readings from Last 3 Encounters:  05/26/21 172 lb (78 kg)  03/25/21 167 lb 8.8 oz (76 kg)  03/24/21 169 lb (76.7 kg)   BMI Readings from Last 3 Encounters:  05/26/21 26.15 kg/m  03/25/21 25.48 kg/m  03/24/21 25.70 kg/m    Assessment/Interventions: Review of patient past medical history, allergies, medications, health status, including review of consultants reports, laboratory and other test data, was performed as part of comprehensive evaluation and provision of chronic care management services.   SDOH:  (Social Determinants of Health) assessments and interventions performed: Yes  SDOH Screenings   Alcohol Screen: Not on file  Depression (PHQ2-9): Low Risk    PHQ-2 Score: 0   Financial Resource Strain: High Risk   Difficulty of Paying Living Expenses: Hard  Food Insecurity: No Food Insecurity   Worried About Charity fundraiser in the Last Year: Never true   Ran Out of Food in the Last Year: Never true  Housing: Low Risk    Last Housing Risk Score: 0  Physical Activity: Not on file  Social Connections: Not on file  Stress: Not on file  Tobacco Use: High Risk   Smoking Tobacco Use: Every Day   Smokeless Tobacco Use: Never   Passive Exposure: Not on file  Transportation Needs: No Transportation Needs   Lack of Transportation (Medical): No   Lack of Transportation (Non-Medical): No    CCM Care Plan  No Known Allergies  Medications Reviewed Today  Reviewed by Tomasa Blase, [facility] (Pharmacist) on 07/15/21 at 1031  Med List Status: <None>   Medication Order Taking? Sig Documenting Provider Last Dose Status Informant  albuterol (PROVENTIL) (2.5 MG/3ML) 0.083% nebulizer solution [id number]  USE 1 VIAL VIA NEBULIZER EVERY 6 HOURS AS NEEDED FOR WHEEZING OR SHORTNESS OF BREATH Sagardia, Ines Bloomer, MD  Active   albuterol (VENTOLIN HFA) 108 (90 Base) MCG/ACT inhaler [id number]  Inhale 2 puffs into the lungs every 6 (six) hours as needed for wheezing or shortness of breath. [name], MD  Active   amLODipine [facility]) 5 MG tablet [id number] Yes Take 1 tablet (5 mg total) by mouth daily. [name], MD Taking Active   atorvastatin (LIPITOR) 20 MG tablet [id number] Yes TAKE ONE TABLET BY MOUTH ONE TIME DAILY [name], MD Taking Active   blood glucose meter kit and supplies [id number] Yes Dispense based on patient and insurance preference. Use up to four times daily as directed. (FOR ICD-10 E10.9, E11.9). [name], MD Taking Active Multiple Informants           Med Note Blanch Media, CYNTHIA A   Tue Dec 22, 2020 10:03 AM) use  blood glucose meter kit and supplies KIT [id number] Yes Dispense based on patient and insurance  preference. Use up to four times daily as directed. [name], MD Taking Active Multiple Informants           Med Note Blanch Media, CYNTHIA A   Tue Dec 22, 2020 10:03 AM) use  Dulaglutide (TRULICITY) 9.32 IZ/1.2WP SOPN [id number] Yes Inject 0.75 mg into the skin once a week. [facility] [provider] Taking Active   glipiZIDE (GLUCOTROL) 10 MG tablet [id number] Yes Take 1 tablet (10 mg total) by mouth daily before breakfast. Takes 1 more tablet if blood sugar is high  Patient taking differently: Take 10 mg by mouth 2 (two) times daily with a meal. Takes 1 more tablet if blood sugar is high   Sagardia, Ines Bloomer, MD Taking Active   glucose blood (ACCU-CHEK GUIDE) test strip [id number] Yes Use as instructed [name], MD Taking Active Multiple Informants           Med Note Alfredia Ferguson A   Tue Dec 22, 2020 10:03 AM) use  metFORMIN (GLUCOPHAGE) 1000 MG tablet [id number] Yes TAKE ONE TABLET BY MOUTH TWICE A DAY WITH A MEAL Sagardia, Ines Bloomer, MD Taking Active   methocarbamol (ROBAXIN) 500 MG tablet [id number] Yes Take 1 tablet (500 mg total) by mouth 2 (two) times daily.  Patient taking differently: Take 500 mg by mouth 2 (two) times daily as needed.   Blanchie Dessert, MD Taking Active   ondansetron [facility] ODT) 4 MG disintegrating tablet [id number] Yes $RemoveBefor'4mg'ITCrBIhmdGUy$  ODT q4 hours prn nausea/vomit Deno Etienne, DO Taking Active   pantoprazole (PROTONIX) 40 MG tablet [id number]  Take 1 tablet (40 mg total) by mouth daily. [name], MD  Expired 06/22/21 2359   SYMBICORT 160-4.5 MCG/ACT inhaler [id number]  INHALE TWO PUFFS BY MOUTH TWICE A DAY [name], MD  Active   Ubrogepant (UBRELVY) 100 MG TABS [id number] Yes Take 1 tablet by mouth daily. Janith Lima, MD Taking Active             Patient Active Problem List   Diagnosis Date Noted   Neck pain on left side 05/26/2021   Intractable persistent migraine aura without cerebral infarction and with  status migrainosus 05/26/2021   Hypertension  associated with diabetes ([place]) 09/21/2020   Other dysphagia    Hyperglycemia 09/13/2020   Uncontrolled type 2 diabetes mellitus with hyperglycemia ([place]) 09/13/2020   CAD (coronary artery disease) 01/23/2020   Hyperlipidemia 11/02/2019   History of MI (myocardial infarction) 11/02/2019   Nonspecific chest pain 11/01/2019   Cigarette smoker 08/15/2018   COPD ? GOLD III/ active smoker 08/14/2018   History of diet-controlled diabetes 06/30/2018   COPD (chronic obstructive pulmonary disease) ([place]) 10/14/2014   Type 2 diabetes mellitus with hyperglycemia, without long-term current use of insulin ([place]) 10/14/2014    Immunization History  Administered Date(s) Administered   Fluad Quad(high Dose 65+) 03/24/2021   Influenza,inj,Quad PF,6+ Mos 09/21/2015, 07/02/2018   Influenza-Unspecified 08/03/2020   PFIZER(Purple Top)SARS-COV-2 Vaccination 09/29/2019, 06/12/2020, 10/20/2020   Pneumococcal Conjugate-13 09/21/2020   Pneumococcal Polysaccharide-23 10/15/2014, 09/21/2015    Conditions to be addressed/monitored:  Hypertension, Hyperlipidemia, Diabetes, GERD, and COPD  Care Plan : [place]  Updates made by Tomasa Blase, RPH since 07/15/2021 12:00 AM     Problem: HTN, CAD, HLD, COPD, DM2, GERD   Priority: High  Onset Date: 04/09/2021     Long-Range Goal: Disease Management   Start Date: 04/09/2021  Expected End Date: 10/08/2021  This Visit's Progress: On track  Recent Progress: On track  Priority: High  Note:    Current Barriers:  Unable to independently afford treatment regimen Unable to independently monitor therapeutic efficacy  Pharmacist Clinical Goal(s):  Patient will verbalize ability to afford treatment regimen achieve adherence to monitoring guidelines and medication adherence to achieve therapeutic efficacy achieve improvement in COPD and DM as evidenced by decreased rescue inhaler use / more stable BG through  collaboration with PharmD and provider.   Interventions: 1:1 collaboration with [name], MD regarding development and update of comprehensive plan of care as evidenced by provider attestation and co-signature Inter-disciplinary care team collaboration (see longitudinal plan of care) Comprehensive medication review performed; medication list updated in electronic medical record  Hypertension (BP goal <130/80) -Controlled -Current treatment: Amlodipine 59m - 1 tablet daily  -Medications previously tried: n/a  -Current home readings: n/a BP Readings from Last 3 Encounters:  05/26/21 132/78  04/14/21 132/76  03/25/21 120/75  -Current dietary habits: notes to adding salt to his food, does not watch sodium intake  -Current exercise habits: walks daily - mostly around/ inside the house  -Denies hypotensive/hypertensive symptoms -Educated on BP goals and benefits of medications for prevention of heart attack, stroke and kidney damage; Daily salt intake goal < 2300 mg; Exercise goal of 150 minutes per week; Importance of home blood pressure monitoring; Proper BP monitoring technique; -Counseled on diet and exercise extensively Recommended to continue current medication  Hyperlipidemia: (LDL goal < 70) -Not ideally controlled Lab Results  Component Value Date   LDLCALC 102 (H) 06/22/2020  -Current treatment: Atorvastatin 250m- 1 tablet daily  -Medications previously tried: n/a  -Current dietary patterns: fried foods on occasion - usually will eat fried chicken with rice and beans  -Current exercise habits: walks daily - mostly around/ inside the house  -Educated on Cholesterol goals;  Benefits of statin for ASCVD risk reduction; Importance of limiting foods high in cholesterol; Exercise goal of 150 minutes per week; -Counseled on diet and exercise extensively -repeat lipid panel with next PCP visit, should LDL remain elevated - increase statin intensity    Diabetes  (A1c goal <7%) -Improving - able to start trulicity  Lab Results  Component Value Date  HGBA1C 7.6 (A) 03/24/2021  -Current medications: Glipizide 53m - 1 tablet daily  Metformin 10057m- 1 tablet twice daily  Trulicity 0.5.73UKeekly  -Medications previously tried: lantus, novolog, tresiba  -Current home glucose readings fasting glucose: 100-130 -Reports hypoglycemic/hyperglycemic symptoms -reports last low blood sugar was over 1 month ago, able to correct lows with soda / eating  -Current meal patterns:  breakfast: oatmeal, small sandwich, coffee  lunch: vegetables with fish  dinner: rice/ beans wit meat  snacks: small sandwich, plantains, fruit  drinks: diet soda  -Current exercise: walks daily - mostly around/ inside the house  -Educated on A1c and blood sugar goals; Complications of diabetes including kidney damage, retinal damage, and cardiovascular disease; Exercise goal of 150 minutes per week; Benefits of weight loss; Proper insulin injection technique; Prevention and management of hypoglycemic episodes; Benefits of routine self-monitoring of blood sugar; Continuous glucose monitoring; Carbohydrate counting and/or plate method -Counseled to check feet daily and get yearly eye exams -Counseled on diet and exercise extensively Recommended for patient to continue current medications, possible in future to increase trulicity to replace need for glipizide   COPD (Goal: control symptoms and prevent exacerbations) -Not ideally controlled -Current treatment  Symbicort 160-4.36m59mact - 2 puffs twice daily  Albuterol 108m36mct - 2 puffs every 4 hours as needed  - using every 4 hours  Albuterol 0.0083% nebulizer solution - 1 vial every 6 hours as needed - using 2-4 times daily  -Medications previously tried: n/a  -Gold Grade: Gold 3 (FEV1 30-49%) -Current COPD Classification:  D (high sx, >/=2 exacerbations/yr) -MMRC/CAT score: not available -Pulmonary function testing:  08/14/2018 -Exacerbations requiring treatment in last 6 months: 2 -Patient reports consistent use of maintenance inhaler -Frequency of rescue inhaler use: 6 times daily, nebulizer use 2-4 times daily  -Counseled on Proper inhaler technique; Benefits of consistent maintenance inhaler use When to use rescue inhaler Differences between maintenance and rescue inhalers -Recommended for increase in maintenance intensity - recommending breztri - 2 puffs twice daily   GERD (Goal: Prevention / control of acid reflux) -Controlled -Current treatment  Pantoprazole 40mg23m tablet daily  -Medications previously tried: n/a  -Counseled on diet and exercise extensively Recommended to continue current medication  Patient Goals/Self-Care Activities Patient will:  - take medications as prescribed check glucose at least 3 times daily, document, and provide at future appointments collaborate with provider on medication access solutions target a minimum of 150 minutes of moderate intensity exercise weekly engage in dietary modifications by reducing sodium intake / moderation of carbohydrate intake  Follow Up Plan: Telephone follow up appointment with care management team member scheduled for: 1 month The patient has been provided with contact information for the care management team and has been advised to call with any health related questions or concerns.      Patient's preferred pharmacy is:  [facility] [id number]e[place]- St[place] WoodsGUIL[place]2542e: [id number]e: [phone]- [place]7[address]e: [phone] [place]- [place]  [place] [address] Phone: [phone] Fax: [phone]   Uses pill box? Yes Pt endorses 100% compliance  Care Plan and Follow Up Patient Decision:  Patient agrees to Care Plan and Follow-up.  Plan: Telephone follow up appointment with care management team member scheduled for:  1 month and The patient has been provided with contact information for the care management team and has been advised to call with any health related questions or concerns.   Tomasa Blase, PharmD Clinical Pharmacist, [place]

## 2021-07-22 ENCOUNTER — Other Ambulatory Visit: Payer: Self-pay | Admitting: Emergency Medicine

## 2021-07-22 ENCOUNTER — Telehealth: Payer: Self-pay

## 2021-07-22 DIAGNOSIS — E1165 Type 2 diabetes mellitus with hyperglycemia: Secondary | ICD-10-CM

## 2021-07-22 DIAGNOSIS — J439 Emphysema, unspecified: Secondary | ICD-10-CM

## 2021-07-22 MED ORDER — TRULICITY 0.75 MG/0.5ML ~~LOC~~ SOAJ: 0.7500 mg | SUBCUTANEOUS | 11 refills | Status: DC

## 2021-07-22 MED ORDER — BREZTRI AEROSPHERE 160-9-4.8 MCG/ACT IN AERO: 2.0000 | INHALATION_SPRAY | Freq: Two times a day (BID) | RESPIRATORY_TRACT | 11 refills | Status: DC

## 2021-07-22 NOTE — Telephone Encounter (Signed)
Called and left message for patient via translator service   Patient to call back for further information  Breztri and Trulicity have been sent to [facility] - confirmed with pharmacy that medications are covered at no cost - patient to stop symbicort - to be replaced by breztri

## 2021-07-22 NOTE — Progress Notes (Signed)
Established Patient Office Visit  Subjective:  Patient ID: Duane Cuevas, male    DOB: 07/12/1955  Age: 66 y.o. MRN: [id number]  CC:  Chief Complaint  Patient presents with   Blood Sugar Problem    Patient states he is here to check his blood pressure and also because his sugar levels has been very high.    HPI Duane Cuevas presents for htn, hyperglycemia  Last A1c:  Lab Results  Component Value Date   HGBA1C 7.6 (A) 03/24/2021    Currently taking: metformin 1000mg  po bid ac, glipizide 5g po bid ac No new complications Reports good compliance with medications Diet has been steady Exercise habits have been steady  Hypertension: Patient Currently taking: no medications Good effect. No AEs. Denies CV symptoms including: chest pain, shob, doe, headache, visual changes, fatigue, claudication, and dependent edema.   Previous readings and labs: BP Readings from Last 3 Encounters:  05/26/21 132/78  04/14/21 132/76  03/25/21 120/75   Lab Results  Component Value Date   CREATININE 0.67 04/14/2021      Past Medical History:  Diagnosis Date   Diabetes mellitus ([place]) 10/2014   pt denies being diabetic.    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     Past Surgical History:  Procedure Laterality Date   BIOPSY  09/16/2020   Procedure: BIOPSY;  Surgeon: Thornton Park, MD;  Location: [facility];  Service: Gastroenterology;;   ESOPHAGOGASTRODUODENOSCOPY N/A 09/23/2015   Procedure: ESOPHAGOGASTRODUODENOSCOPY (EGD);  Surgeon: Ladene Artist, MD;  Location: [facility];  Service: Endoscopy;  Laterality: N/A;   ESOPHAGOGASTRODUODENOSCOPY (EGD) WITH PROPOFOL N/A 09/16/2020   Procedure: ESOPHAGOGASTRODUODENOSCOPY (EGD) WITH PROPOFOL;  Surgeon: Thornton Park, MD;  Location: [facility];  Service: Gastroenterology;  Laterality: N/A;   FOOT SURGERY     INGUINAL HERNIA REPAIR  Left 05/15/2018   Procedure: LEFT INGUINAL HERNIA REPAIR WITH MESH;  Surgeon: Erroll Luna, MD;  Location: [place];  Service: General;  Laterality: Left;   INSERTION OF MESH Left 05/15/2018   Procedure: INSERTION OF MESH;  Surgeon: Erroll Luna, MD;  Location: [facility];  Service: General;  Laterality: Left;   UPPER GASTROINTESTINAL ENDOSCOPY      Family History  Problem Relation Age of Onset   Emphysema Mother    Diabetes Mother    Emphysema Father    Cancer Sister        Stomach cancer   Diabetes Sister    Stomach cancer Sister    Diabetes Brother    Colon cancer Neg Hx    Colon polyps Neg Hx    Esophageal cancer Neg Hx    Rectal cancer Neg Hx     Social History   Socioeconomic History   Marital status: Married    Spouse name: Zoraida   Number of children: 6   Years of education: Not on file   Highest education level: Not on file  Occupational History   Occupation: Employed    Employer: [employer]  Tobacco Use   Smoking status: Every Day    Packs/day: 0.15    Years: 47.00    Pack years: 7.05    Types: Cigarettes   Smokeless tobacco: Never  Vaping Use   Vaping Use: Former  Substance and Sexual Activity   Alcohol use: Yes    Comment: occ   Drug use: No   Sexual activity: Never  Other  Topics Concern   Not on file  Social History Narrative   Lives with wife.  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   Social Determinants of Health   Financial Resource Strain: High Risk   Difficulty of Paying Living Expenses: Hard  Food Insecurity: No Food Insecurity   Worried About Charity fundraiser in the Last Year: Never true   Ran Out of Food in the Last Year: Never true  Transportation Needs: No Transportation Needs   Lack of Transportation (Medical): No   Lack of Transportation (Non-Medical): No  Physical Activity: Not on file  Stress: Not on file  Social Connections: Not on file  Intimate Partner Violence: Not on file    Outpatient Medications  Prior to Visit  Medication Sig Dispense Refill   acetaminophen (TYLENOL) 500 MG tablet Take 1,000 mg by mouth every 6 (six) hours as needed for moderate pain or headache.     albuterol (PROVENTIL) (2.5 MG/3ML) 0.083% nebulizer solution TAKE 3 ML(2.5 MG TOTAL) BY NEBULIZATION EVERY 6(SIX) HOURS AS NEEDED (Patient taking differently: Take 2.5 mg by nebulization every 6 (six) hours as needed for wheezing or shortness of breath.) 150 mL 3   atorvastatin (LIPITOR) 20 MG tablet Take 1 tablet (20 mg total) by mouth daily. 90 tablet 3   budesonide-formoterol (SYMBICORT) 160-4.5 MCG/ACT inhaler Inhale 2 puffs into the lungs 2 (two) times daily. 1 each 11   glipiZIDE (GLUCOTROL) 5 MG tablet Take 1 tablet (5 mg total) by mouth 2 (two) times daily before a meal. 180 tablet 3   meclizine (ANTIVERT) 25 MG tablet TAKE ONE TABLET BY MOUTH THREE TIMES A DAY AS NEEDED FOR DIZZINESS (Patient not taking: No sig reported) 30 tablet 0   metFORMIN (GLUCOPHAGE) 1000 MG tablet Take 1 tablet (1,000 mg total) by mouth 2 (two) times daily with a meal. 180 tablet 3   predniSONE (DELTASONE) 20 MG tablet Take 2 tablets (40 mg total) by mouth daily. (Patient taking differently: Take 40 mg by mouth daily as needed (pain).) 10 tablet 0   aspirin EC 81 MG EC tablet Take 1 tablet (81 mg total) by mouth daily. (Patient not taking: No sig reported) 30 tablet 1   No facility-administered medications prior to visit.    No Known Allergies  ROS Review of Systems  Constitutional: Negative.   HENT: Negative.    Eyes: Negative.   Respiratory: Negative.    Cardiovascular: Negative.   Gastrointestinal: Negative.   Genitourinary: Negative.   Musculoskeletal: Negative.   Skin: Negative.   Neurological: Negative.   Psychiatric/Behavioral: Negative.    All other systems reviewed and are negative.    Objective:    Physical Exam Constitutional:      General: He is not in acute distress.    Appearance: Normal appearance. He is  normal weight. He is not ill-appearing, toxic-appearing or diaphoretic.  Cardiovascular:     Rate and Rhythm: Normal rate and regular rhythm.     Heart sounds: Normal heart sounds. No murmur heard.   No friction rub. No gallop.  Pulmonary:     Effort: Pulmonary effort is normal. No respiratory distress.     Breath sounds: Normal breath sounds. No stridor. No wheezing, rhonchi or rales.  Chest:     Chest wall: No tenderness.  Neurological:     General: No focal deficit present.     Mental Status: He is alert and oriented to person, place, and time. Mental status is at baseline.  Psychiatric:        Mood and Affect: Mood normal.        Behavior: Behavior normal.        Thought Content: Thought content normal.        Judgment: Judgment normal.    BP (!) 146/79    Pulse (!) 110    Temp 98 F (36.7 C) (Temporal)    Resp 18    Ht 5\' 8"  (1.727 m)    Wt 165 lb (74.8 kg)    SpO2 95%    BMI 25.09 kg/m  Wt Readings from Last 3 Encounters:  05/26/21 172 lb (78 kg)  03/25/21 167 lb 8.8 oz (76 kg)  03/24/21 169 lb (76.7 kg)     Health Maintenance Due  Topic Date Due   URINE MICROALBUMIN  Never done   Zoster Vaccines- Shingrix (1 of 2) Never done   COVID-19 Vaccine (4 - Booster for Pfizer series) 12/15/2020   Pneumonia Vaccine 72+ Years old (3 - PPSV23 if available, else PCV20) 09/21/2021    There are no preventive care reminders to display for this patient.  No results found for: TSH Lab Results  Component Value Date   WBC 8.5 04/14/2021   HGB 15.0 04/14/2021   HCT 44.5 04/14/2021   MCV 91.0 04/14/2021   PLT 255 04/14/2021   Lab Results  Component Value Date   NA 139 04/14/2021   K 3.6 04/14/2021   CO2 24 04/14/2021   GLUCOSE 144 (H) 04/14/2021   BUN 10 04/14/2021   CREATININE 0.67 04/14/2021   BILITOT 0.9 10/21/2020   ALKPHOS 66 10/21/2020   AST 15 10/21/2020   ALT 22 10/21/2020   PROT 6.9 10/21/2020   ALBUMIN 3.6 10/21/2020   CALCIUM 9.6 04/14/2021   ANIONGAP 12  04/14/2021   Lab Results  Component Value Date   CHOL 172 06/22/2020   Lab Results  Component Value Date   HDL 48 06/22/2020   Lab Results  Component Value Date   LDLCALC 102 (H) 06/22/2020   Lab Results  Component Value Date   TRIG 126 06/22/2020   Lab Results  Component Value Date   CHOLHDL 3.6 06/22/2020   Lab Results  Component Value Date   HGBA1C 7.6 (A) 03/24/2021      Assessment & Plan:   Problem List Items Addressed This Visit       Endocrine   Type 2 diabetes mellitus with hyperglycemia, without long-term current use of insulin (HCC) - Primary   Relevant Orders   POCT glycosylated hemoglobin (Hb A1C) (Completed)   POCT glucose (manual entry) (Completed)   Other Visit Diagnoses     Essential hypertension           Meds ordered this encounter  Medications   DISCONTD: amLODipine (NORVASC) 5 MG tablet    Sig: Take 1 tablet (5 mg total) by mouth daily.    Dispense:  90 tablet    Refill:  3    Order Specific Question:   Supervising Provider    Answer:   Carlota Raspberry, JEFFREY R [[id number]]   DISCONTD: glipiZIDE (GLUCOTROL) 10 MG tablet    Sig: Take 1 tablet (10 mg total) by mouth 2 (two) times daily before a meal.    Dispense:  60 tablet    Refill:  3    Order Specific Question:   Supervising Provider    Answer:   Carlota Raspberry, JEFFREY R [[id number]]    Follow-up: No follow-ups on file.  PLAN Increase glipizide to 10mg  po bid ac Start amlodipine 5mg  po qd Dicsussed healthy lifestyle for controlling chronic conditions and lowering CV risk Return in 3 mo with pcp to recheck Patient encouraged to call clinic with any questions, comments, or concerns.  Maximiano Coss, NP

## 2021-08-03 DIAGNOSIS — E1169 Type 2 diabetes mellitus with other specified complication: Secondary | ICD-10-CM

## 2021-08-03 DIAGNOSIS — R69 Illness, unspecified: Secondary | ICD-10-CM | POA: Diagnosis not present

## 2021-08-03 DIAGNOSIS — J449 Chronic obstructive pulmonary disease, unspecified: Secondary | ICD-10-CM | POA: Diagnosis not present

## 2021-08-03 DIAGNOSIS — I10 Essential (primary) hypertension: Secondary | ICD-10-CM

## 2021-08-03 DIAGNOSIS — E785 Hyperlipidemia, unspecified: Secondary | ICD-10-CM | POA: Diagnosis not present

## 2021-08-03 DIAGNOSIS — F17210 Nicotine dependence, cigarettes, uncomplicated: Secondary | ICD-10-CM

## 2021-08-03 DIAGNOSIS — Z7984 Long term (current) use of oral hypoglycemic drugs: Secondary | ICD-10-CM | POA: Diagnosis not present

## 2021-08-06 ENCOUNTER — Emergency Department (HOSPITAL_COMMUNITY): Payer: Medicare HMO

## 2021-08-06 ENCOUNTER — Encounter (HOSPITAL_COMMUNITY): Payer: Self-pay

## 2021-08-06 ENCOUNTER — Emergency Department (HOSPITAL_COMMUNITY)
Admission: EM | Admit: 2021-08-06 | Discharge: 2021-08-06 | Disposition: A | Discharge status: Home / Self Care | Payer: Medicare HMO | Attending: Emergency Medicine | Admitting: Emergency Medicine

## 2021-08-06 ENCOUNTER — Other Ambulatory Visit: Payer: Self-pay

## 2021-08-06 DIAGNOSIS — R0602 Shortness of breath: Secondary | ICD-10-CM | POA: Insufficient documentation

## 2021-08-06 DIAGNOSIS — R1013 Epigastric pain: Secondary | ICD-10-CM | POA: Insufficient documentation

## 2021-08-06 DIAGNOSIS — Z79899 Other long term (current) drug therapy: Secondary | ICD-10-CM | POA: Diagnosis not present

## 2021-08-06 DIAGNOSIS — Z20822 Contact with and (suspected) exposure to covid-19: Secondary | ICD-10-CM | POA: Insufficient documentation

## 2021-08-06 DIAGNOSIS — Z7984 Long term (current) use of oral hypoglycemic drugs: Secondary | ICD-10-CM | POA: Insufficient documentation

## 2021-08-06 DIAGNOSIS — R0789 Other chest pain: Secondary | ICD-10-CM | POA: Insufficient documentation

## 2021-08-06 DIAGNOSIS — J441 Chronic obstructive pulmonary disease with (acute) exacerbation: Secondary | ICD-10-CM | POA: Insufficient documentation

## 2021-08-06 DIAGNOSIS — R079 Chest pain, unspecified: Secondary | ICD-10-CM | POA: Diagnosis not present

## 2021-08-06 DIAGNOSIS — R911 Solitary pulmonary nodule: Secondary | ICD-10-CM | POA: Diagnosis not present

## 2021-08-06 DIAGNOSIS — Z87891 Personal history of nicotine dependence: Secondary | ICD-10-CM | POA: Diagnosis not present

## 2021-08-06 DIAGNOSIS — R519 Headache, unspecified: Secondary | ICD-10-CM | POA: Insufficient documentation

## 2021-08-06 LAB — BASIC METABOLIC PANEL
Anion gap: 11 (ref 5–15)
BUN: 20 mg/dL (ref 8–23)
CO2: 22 mmol/L (ref 22–32)
Calcium: 9.4 mg/dL (ref 8.9–10.3)
Chloride: 101 mmol/L (ref 98–111)
Creatinine, Ser: 0.88 mg/dL (ref 0.61–1.24)
GFR, Estimated: >60 mL/min (ref >60)
Glucose, Bld: 264 mg/dL — ABNORMAL HIGH (ref 70–99)
Potassium: 4 mmol/L (ref 3.5–5.1)
Sodium: 134 mmol/L — ABNORMAL LOW (ref 135–145)

## 2021-08-06 LAB — CBC
HCT: 47 % (ref 39.0–52.0)
Hemoglobin: 15.7 g/dL (ref 13.0–17.0)
MCH: 30.7 pg (ref 26.0–34.0)
MCHC: 33.4 g/dL (ref 30.0–36.0)
MCV: 92 fL (ref 80.0–100.0)
Platelets: 230 10*3/uL (ref 150–400)
RBC: 5.11 MIL/uL (ref 4.22–5.81)
RDW: 13 % (ref 11.5–15.5)
WBC: 9.3 10*3/uL (ref 4.0–10.5)
nRBC: 0 % (ref 0.0–0.2)

## 2021-08-06 LAB — TROPONIN I (HIGH SENSITIVITY)
Troponin I (High Sensitivity): 2 ng/L (ref <18)
Troponin I (High Sensitivity): 3 ng/L (ref <18)

## 2021-08-06 LAB — RESP PANEL BY RT-PCR (FLU A&B, COVID) ARPGX2
Influenza A by PCR: NEGATIVE
Influenza B by PCR: NEGATIVE
SARS Coronavirus 2 by RT PCR: NEGATIVE

## 2021-08-06 LAB — D-DIMER, QUANTITATIVE: D-Dimer, Quant: <0.27 ug/mL-FEU (ref 0.00–0.50)

## 2021-08-06 MED ORDER — PREDNISONE 20 MG PO TABS: 60.0000 mg | ORAL_TABLET | Freq: Every day | ORAL | 0 refills | Status: DC

## 2021-08-06 MED ORDER — SODIUM CHLORIDE 0.9 % IV BOLUS
500.0000 mL | Freq: Once | INTRAVENOUS | Status: AC
Start: 2021-08-06 — End: 2021-08-06
  Administered 2021-08-06: 500 mL via INTRAVENOUS

## 2021-08-06 MED ORDER — HYDROCODONE-ACETAMINOPHEN 5-325 MG PO TABS: 1.0000 | ORAL_TABLET | Freq: Four times a day (QID) | ORAL | 0 refills | Status: DC | PRN

## 2021-08-06 MED ORDER — KETOROLAC TROMETHAMINE 30 MG/ML IJ SOLN
15.0000 mg | Freq: Once | INTRAMUSCULAR | Status: AC
  Administered 2021-08-06: 15 mg via INTRAVENOUS
  Filled 2021-08-06: qty 1

## 2021-08-06 MED ORDER — FAMOTIDINE IN NACL 20-0.9 MG/50ML-% IV SOLN
20.0000 mg | Freq: Once | INTRAVENOUS | Status: AC
  Administered 2021-08-06: 20 mg via INTRAVENOUS
  Filled 2021-08-06: qty 50

## 2021-08-06 MED ORDER — HYDROCODONE-ACETAMINOPHEN 5-325 MG PO TABS
1.0000 | ORAL_TABLET | Freq: Once | ORAL | Status: AC
  Administered 2021-08-06: 1 via ORAL
  Filled 2021-08-06: qty 1

## 2021-08-06 MED ORDER — PREDNISONE 20 MG PO TABS
60.0000 mg | ORAL_TABLET | Freq: Once | ORAL | Status: AC
  Administered 2021-08-06: 60 mg via ORAL
  Filled 2021-08-06: qty 3

## 2021-08-06 MED ORDER — IPRATROPIUM-ALBUTEROL 0.5-2.5 (3) MG/3ML IN SOLN
3.0000 mL | Freq: Once | RESPIRATORY_TRACT | Status: AC
  Administered 2021-08-06: 3 mL via RESPIRATORY_TRACT
  Filled 2021-08-06: qty 3

## 2021-08-06 MED ORDER — MORPHINE SULFATE (PF) 4 MG/ML IV SOLN
4.0000 mg | Freq: Once | INTRAVENOUS | Status: AC
  Administered 2021-08-06: 4 mg via INTRAVENOUS
  Filled 2021-08-06: qty 1

## 2021-08-06 NOTE — Discharge Instructions (Addendum)
You are seen in the emergency department for cough and left-sided chest pain.  You had blood work EKG chest x-ray done that did not show any signs of heart attack or blood clot.  We are prescribing you some prednisone and some pain medication.  Please continue your inhalers.  Follow-up with your primary care doctor.  Return to the emergency department if any worsening or concerning symptoms

## 2021-08-06 NOTE — ED Triage Notes (Signed)
Patient c/o left chest pain that radiates into the left arm since 0530 today. Patient states SOB is worse today. Patient reports a history of COPD. Patient also c/o headache.

## 2021-08-06 NOTE — ED Provider Triage Note (Signed)
Emergency Medicine Provider Triage Evaluation Note  Duane Cuevas , a 66 y.o. male  was evaluated in triage.  Pt complains of left sided chest pain with radiation and numbness/tingling down his left arm since 0530 today. Denies any nausea, vomiting, diaphoresis. Reports SOB. Reports burning pain. Pain on inspiration.   H/o emphysema, CM, HLD, former smoker. H/o MI on Epic, but the patient denies any history.   Review of Systems  Positive: Chest pain, numbness, tingling, SOB Negative: Nausea, vomiting, abdominal pain, diaphoresis  Physical Exam  BP 127/83 (BP Location: Right Arm)    Pulse (!) 108    Temp 98.2 F (36.8 C) (Oral)    Resp 14    Ht 5\' 8"  (1.727 m)    Wt 79.4 kg    SpO2 96%    BMI 26.61 kg/m  Gen:   Awake, no distress   Resp:  Normal effort, exp wheezing  MSK:   Moves extremities without difficulty  Other:  Tachycardia. Radial pulses equal bilaterally  Medical Decision Making  Medically screening exam initiated at 11:49 AM.  Appropriate orders placed.  Duane Cuevas was informed that the remainder of the evaluation will be completed by another provider, this initial triage assessment does not replace that evaluation, and the importance of remaining in the ED until their evaluation is complete.  Cardiac orders placed.   Sherrell Puller, PA-C 08/06/21 1155

## 2021-08-06 NOTE — ED Provider Notes (Signed)
Duane Cuevas Provider Note   CSN: [id number] Arrival date & time: 08/06/21  1049     History  Chief Complaint  Patient presents with   Shortness of Breath   Chest Pain    Duane Cuevas is a 66 y.o. male.  History via iPad interpreter Spanish.  Complaining of left-sided chest pain that started around 430 this morning radiating into shoulder and arm left, associated with shortness of breath.  Cough productive of some yellow sputum.  Also having some epigastric burning and sour taste in mouth.  Quit tobacco 3 months ago.  History of COPD.  Also has a headache history of headaches  The history is provided by the patient.  Shortness of Breath Severity:  Moderate Onset quality:  Sudden Duration:  9 hours Timing:  Constant Progression:  Unchanged Chronicity:  New Relieved by:  None tried Worsened by:  Nothing Ineffective treatments:  None tried Associated symptoms: abdominal pain, chest pain, cough, headaches and sputum production   Associated symptoms: no fever, no hemoptysis, no rash, no sore throat, no syncope and no vomiting   Chest Pain Pain location:  L chest Pain quality: burning   Pain radiates to:  L arm and L shoulder Pain severity:  Severe Onset quality:  Sudden Duration:  9 hours Timing:  Constant Progression:  Unchanged Chronicity:  New Context: at rest   Relieved by:  None tried Worsened by:  Nothing Ineffective treatments:  None tried Associated symptoms: abdominal pain, cough, headache and shortness of breath   Associated symptoms: no fever, no nausea, no syncope and no vomiting       Home Medications Prior to Admission medications   Medication Sig Start Date End Date Taking? Authorizing Provider  albuterol (PROVENTIL) (2.5 MG/3ML) 0.083% nebulizer solution USE 1 VIAL VIA NEBULIZER EVERY 6 HOURS AS NEEDED FOR WHEEZING OR SHORTNESS OF BREATH 05/24/21   [name], MD  albuterol (VENTOLIN HFA) 108 (90 Base)  MCG/ACT inhaler Inhale 2 puffs into the lungs every 6 (six) hours as needed for wheezing or shortness of breath. 04/15/21   [name], MD  amLODipine (NORVASC) 5 MG tablet Take 1 tablet (5 mg total) by mouth daily. 12/22/20   [name], MD  atorvastatin (LIPITOR) 20 MG tablet TAKE ONE TABLET BY MOUTH ONE TIME DAILY 05/11/21   [name], MD  blood glucose meter kit and supplies KIT Dispense based on patient and insurance preference. Use up to four times daily as directed. 10/22/20   [name], MD  blood glucose meter kit and supplies Dispense based on patient and insurance preference. Use up to four times daily as directed. (FOR ICD-10 E10.9, E11.9). 10/24/20   [name], MD  Budeson-Glycopyrrol-Formoterol (BREZTRI AEROSPHERE) 160-9-4.8 MCG/ACT AERO Inhale 2 puffs into the lungs 2 (two) times daily. 07/22/21   [name], MD  Dulaglutide (TRULICITY) 0.08 QP/6.1PJ SOPN Inject 0.75 mg into the skin once a week. Via [facility] 07/22/21   [name], MD  glipiZIDE (GLUCOTROL) 10 MG tablet Take 1 tablet (10 mg total) by mouth daily before breakfast. Takes 1 more tablet if blood sugar is high Patient taking differently: Take 10 mg by mouth 2 (two) times daily with a meal. Takes 1 more tablet if blood sugar is high 05/17/21   [name], MD  glucose blood (ACCU-CHEK GUIDE) test strip Use as instructed 10/22/20   [name], MD  metFORMIN (GLUCOPHAGE) 1000 MG tablet TAKE  ONE TABLET BY MOUTH TWICE A DAY WITH A MEAL 06/29/21   Sagardia, Ines Bloomer, MD  methocarbamol (ROBAXIN) 500 MG tablet Take 1 tablet (500 mg total) by mouth 2 (two) times daily. Patient taking differently: Take 500 mg by mouth 2 (two) times daily as needed. 03/25/21   Blanchie Dessert, MD  ondansetron (ZOFRAN ODT) 4 MG disintegrating tablet 4mg  ODT q4 hours prn nausea/vomit 10/28/20   Deno Etienne, DO  pantoprazole (PROTONIX) 40 MG tablet Take 1  tablet (40 mg total) by mouth daily. 03/24/21 06/22/21  [name], MD  SYMBICORT 160-4.5 MCG/ACT inhaler INHALE TWO PUFFS BY MOUTH TWICE A DAY 06/29/21   Sagardia, Ines Bloomer, MD  Ubrogepant (UBRELVY) 100 MG TABS Take 1 tablet by mouth daily. 05/26/21   Janith Lima, MD      Allergies    Patient has no known allergies.    Review of Systems   Review of Systems  Constitutional:  Negative for fever.  HENT:  Negative for sore throat.   Eyes:  Negative for visual disturbance.  Respiratory:  Positive for cough, sputum production and shortness of breath. Negative for hemoptysis.   Cardiovascular:  Positive for chest pain. Negative for syncope.  Gastrointestinal:  Positive for abdominal pain. Negative for nausea and vomiting.  Genitourinary:  Negative for dysuria.  Musculoskeletal:  Negative for gait problem.  Skin:  Negative for rash.  Neurological:  Positive for headaches.   Physical Exam Updated Vital Signs BP 125/85    Pulse 96    Temp 98 F (36.7 C) (Oral)    Resp 18    Ht 5\' 8"  (1.727 m)    Wt 79.4 kg    SpO2 96%    BMI 26.61 kg/m  Physical Exam Vitals and nursing note reviewed.  Constitutional:      General: He is not in acute distress.    Appearance: He is well-developed.  HENT:     Head: Normocephalic and atraumatic.  Eyes:     Conjunctiva/sclera: Conjunctivae normal.  Cardiovascular:     Rate and Rhythm: Normal rate and regular rhythm.     Heart sounds: No murmur heard. Pulmonary:     Effort: Pulmonary effort is normal. No respiratory distress.     Breath sounds: Normal breath sounds.  Abdominal:     Palpations: Abdomen is soft.     Tenderness: There is no abdominal tenderness.  Musculoskeletal:        General: No swelling. Normal range of motion.     Cervical back: Neck supple.     Right lower leg: No tenderness. No edema.     Left lower leg: No tenderness. No edema.  Skin:    General: Skin is warm and dry.     Capillary Refill: Capillary refill  takes less than 2 seconds.  Neurological:     General: No focal deficit present.     Mental Status: He is alert.    ED Results / Procedures / Treatments   Labs (all labs ordered are listed, but only abnormal results are displayed) Labs Reviewed  BASIC METABOLIC PANEL - Abnormal; Notable for the following components:      Result Value   Sodium 134 (*)    Glucose, Bld 264 (*)    All other components within normal limits  RESP PANEL BY RT-PCR (FLU A&B, COVID) ARPGX2  CBC  D-DIMER, QUANTITATIVE (NOT AT [facility])  TROPONIN I (HIGH SENSITIVITY)  TROPONIN I (HIGH SENSITIVITY)    EKG EKG Interpretation  Date/Time:  Friday August 06 2021 11:08:01 EST Ventricular Rate:  104 PR Interval:  126 QRS Duration: 106 QT Interval:  345 QTC Calculation: 623 R Axis:   57 Text Interpretation: Sinus tachycardia Biatrial enlargement No significant change since prior 10/22 Confirmed by Aletta Edouard [phone]) on 08/06/2021 12:57:37 PM  Radiology DG Chest 2 View  Result Date: 08/06/2021 CLINICAL DATA:  Chest pain, shortness of breath EXAM: CHEST - 2 VIEW COMPARISON:  CT chest 10/20/2020 FINDINGS: Calcified left lower lobe pulmonary nodule consistent with sequela of prior granulomatous disease. Lungs are hyperinflated as can be seen with COPD. No focal consolidation. No pleural effusion or pneumothorax. Heart and mediastinal contours are unremarkable. No acute osseous abnormality. IMPRESSION: No active cardiopulmonary disease. Electronically Signed   By: Kathreen Devoid M.D.   On: 08/06/2021 11:25    Procedures Procedures    Medications Ordered in ED Medications  morphine (PF) 4 MG/ML injection 4 mg (4 mg Intravenous Given 08/06/21 1340)  famotidine (PEPCID) IVPB 20 mg premix (0 mg Intravenous Stopped 08/06/21 1410)  sodium chloride 0.9 % bolus 500 mL (0 mLs Intravenous Stopped 08/06/21 1454)  predniSONE (DELTASONE) tablet 60 mg (60 mg Oral Given 08/06/21 1446)  ipratropium-albuterol (DUONEB) 0.5-2.5 (3)  MG/3ML nebulizer solution 3 mL (3 mLs Nebulization Given 08/06/21 1447)  HYDROcodone-acetaminophen (NORCO/VICODIN) 5-325 MG per tablet 1 tablet (1 tablet Oral Given 08/06/21 1606)  ketorolac (TORADOL) 30 MG/ML injection 15 mg (15 mg Intravenous Given 08/06/21 1606)    ED Course/ Medical Decision Making/ A&P Clinical Course as of 08/06/21 1809  Fri Aug 06, 2021  1502 Patient states he still having pain on the left side of his chest although the right side feels better now.  Delta troponins have been flat. [MB]    Clinical Course User Index [MB] Hayden Rasmussen, MD                           Medical Decision Making Amount and/or Complexity of Data Reviewed Labs: ordered. Radiology: ordered.  Risk Prescription drug management.  Florian Chauca was evaluated in Emergency Department on 08/06/2021 for the symptoms described in the history of present illness. He was evaluated in the context of the global COVID-19 pandemic, which necessitated consideration that the patient might be at risk for infection with the SARS-CoV-2 virus that causes COVID-19. Institutional protocols and algorithms that pertain to the evaluation of patients at risk for COVID-19 are in a state of rapid change based on information released by regulatory bodies including the CDC and federal and state organizations. These policies and algorithms were followed during the patient's care in the ED.  This patient complains of cough shortness of breath left-sided chest pain; this involves an extensive number of treatment Options and is a complaint that carries with it a high risk of complications and Morbidity. The differential includes COPD, CHF, pneumothorax, pneumonia, COVID, flu, PE bronchitis, musculoskeletal, reflux  I ordered, reviewed and interpreted labs, which included CBC with normal white count normal hemoglobin, chemistries, elevated glucose and some pseudohyponatremia, troponins flat, D-dimer negative, COVID and flu  negative I ordered medication IV and oral pain medication steroids, inhalational treatment, Pepcid I ordered imaging studies which included chest x-ray and I independently    visualized and interpreted imaging which showed no acute infiltrates Previous records obtained and reviewed in epic, patient has been seen before for shortness of breath and chest pain  After the interventions stated above, I reevaluated the patient  and found patient to be satting well on room air in no distress.  Reviewed results of work-up with him.  He is comfortable plan for discharge and close follow-up with his treating provider.  Return instructions discussed          Final Clinical Impression(s) / ED Diagnoses Final diagnoses:  Atypical chest pain  COPD exacerbation ([place])    Rx / DC Orders ED Discharge Orders          Ordered    predniSONE (DELTASONE) 20 MG tablet  Daily        08/06/21 1451    HYDROcodone-acetaminophen (NORCO/VICODIN) 5-325 MG tablet  Every 6 hours PRN        08/06/21 1523              Hayden Rasmussen, MD 08/06/21 1811

## 2021-08-09 ENCOUNTER — Other Ambulatory Visit: Payer: Self-pay

## 2021-08-09 ENCOUNTER — Other Ambulatory Visit: Payer: Self-pay | Admitting: Emergency Medicine

## 2021-08-09 ENCOUNTER — Ambulatory Visit (INDEPENDENT_AMBULATORY_CARE_PROVIDER_SITE_OTHER): Payer: Medicare HMO

## 2021-08-09 VITALS — BP 130/80 | HR 109 | Ht 68.0 in | Wt 167.4 lb

## 2021-08-09 DIAGNOSIS — Z0000 Encounter for general adult medical examination without abnormal findings: Secondary | ICD-10-CM | POA: Diagnosis not present

## 2021-08-09 DIAGNOSIS — E1165 Type 2 diabetes mellitus with hyperglycemia: Secondary | ICD-10-CM

## 2021-08-09 NOTE — Progress Notes (Signed)
Subjective:   Duane Cuevas is a 66 y.o. male who presents for an Initial Medicare Annual Wellness Visit.  Review of Systems     Cardiac Risk Factors include: advanced age (>10mn, >>11women);diabetes mellitus;dyslipidemia;hypertension;male gender;smoking/ tobacco exposure     Objective:    Today's Vitals   08/09/21 0925 08/09/21 0955  BP:  130/80  Pulse: (!) 109   SpO2: 96%   Weight: 167 lb 6.4 oz (75.9 kg)   Height: 5' 8" (1.727 m)   PainSc:  8    Body mass index is 25.45 kg/m.  Advanced Directives 08/09/2021 08/06/2021 03/25/2021 01/14/2021 10/28/2020 10/20/2020 10/20/2020  Does Patient Have a Medical Advance Directive? _0  No No  Would patient like information on creating a medical advance directive? No - Patient declined No - Patient declined No - Patient declined Yes (MAU/Ambulatory/Procedural Areas - Information given) - No - Patient declined -    Current Medications (verified) Outpatient Encounter Medications as of 08/09/2021  Medication Sig   albuterol (PROVENTIL) (2.5 MG/3ML) 0.083% nebulizer solution USE 1 VIAL VIA NEBULIZER EVERY 6 HOURS AS NEEDED FOR WHEEZING OR SHORTNESS OF BREATH   albuterol (VENTOLIN HFA) 108 (90 Base) MCG/ACT inhaler Inhale 2 puffs into the lungs every 6 (six) hours as needed for wheezing or shortness of breath.   amLODipine (NORVASC) 5 MG tablet Take 1 tablet (5 mg total) by mouth daily.   atorvastatin (LIPITOR) 20 MG tablet TAKE ONE TABLET BY MOUTH ONE TIME DAILY   blood glucose meter kit and supplies KIT Dispense based on patient and insurance preference. Use up to four times daily as directed.   blood glucose meter kit and supplies Dispense based on patient and insurance preference. Use up to four times daily as directed. (FOR ICD-10 E10.9, E11.9).   Budeson-Glycopyrrol-Formoterol (BREZTRI AEROSPHERE) 160-9-4.8 MCG/ACT AERO Inhale 2 puffs into the lungs 2 (two) times daily.   Dulaglutide (TRULICITY) 05.63MSL/3.7DSSOPN Inject 0.75 mg  into the skin once a week. Via [facility]   glipiZIDE (GLUCOTROL) 10 MG tablet Take 1 tablet (10 mg total) by mouth daily before breakfast. Takes 1 more tablet if blood sugar is high (Patient taking differently: Take 10 mg by mouth 2 (two) times daily with a meal. Takes 1 more tablet if blood sugar is high)   glucose blood (ACCU-CHEK GUIDE) test strip Use as instructed   HYDROcodone-acetaminophen (NORCO/VICODIN) 5-325 MG tablet Take 1 tablet by mouth every 6 (six) hours as needed.   metFORMIN (GLUCOPHAGE) 1000 MG tablet TAKE ONE TABLET BY MOUTH TWICE A DAY WITH A MEAL   methocarbamol (ROBAXIN) 500 MG tablet Take 1 tablet (500 mg total) by mouth 2 (two) times daily. (Patient taking differently: Take 500 mg by mouth 2 (two) times daily as needed.)   ondansetron (ZOFRAN ODT) 4 MG disintegrating tablet 437mODT q4 hours prn nausea/vomit   pantoprazole (PROTONIX) 40 MG tablet Take 1 tablet (40 mg total) by mouth daily.   predniSONE (DELTASONE) 20 MG tablet Take 3 tablets (60 mg total) by mouth daily.   SYMBICORT 160-4.5 MCG/ACT inhaler INHALE TWO PUFFS BY MOUTH TWICE A DAY   Ubrogepant (UBRELVY) 100 MG TABS Take 1 tablet by mouth daily.   Facility-Administered Encounter Medications as of 08/09/2021  Medication   morphine 2 MG/ML injection    Allergies (verified) Patient has no known allergies.   History: Past Medical History:  Diagnosis Date   Diabetes mellitus ([place]4/2016   pt denies being diabetic.  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    Past Surgical History:  Procedure Laterality Date   BIOPSY  09/16/2020   Procedure: BIOPSY;  Surgeon: Thornton Park, MD;  Location: [facility];  Service: Gastroenterology;;   ESOPHAGOGASTRODUODENOSCOPY N/A 09/23/2015   Procedure: ESOPHAGOGASTRODUODENOSCOPY (EGD);  Surgeon: Ladene Artist, MD;  Location: [facility];  Service: Endoscopy;  Laterality:  N/A;   ESOPHAGOGASTRODUODENOSCOPY (EGD) WITH PROPOFOL N/A 09/16/2020   Procedure: ESOPHAGOGASTRODUODENOSCOPY (EGD) WITH PROPOFOL;  Surgeon: Thornton Park, MD;  Location: [facility];  Service: Gastroenterology;  Laterality: N/A;   FOOT SURGERY     INGUINAL HERNIA REPAIR Left 05/15/2018   Procedure: LEFT INGUINAL HERNIA REPAIR WITH MESH;  Surgeon: Erroll Luna, MD;  Location: [place];  Service: General;  Laterality: Left;   INSERTION OF MESH Left 05/15/2018   Procedure: INSERTION OF MESH;  Surgeon: Erroll Luna, MD;  Location: [place];  Service: General;  Laterality: Left;   UPPER GASTROINTESTINAL ENDOSCOPY     Family History  Problem Relation Age of Onset   Emphysema Mother    Diabetes Mother    Emphysema Father    Cancer Sister        Stomach cancer   Diabetes Sister    Stomach cancer Sister    Diabetes Brother    Colon cancer Neg Hx    Colon polyps Neg Hx    Esophageal cancer Neg Hx    Rectal cancer Neg Hx    Social History   Socioeconomic History   Marital status: Married    Spouse name: Zoraida   Number of children: 6   Years of education: Not on file   Highest education level: Not on file  Occupational History   Occupation: Employed    Employer: [employer]  Tobacco Use   Smoking status: Every Day    Packs/day: 0.15    Years: 47.00    Pack years: 7.05    Types: Cigarettes   Smokeless tobacco: Never  Vaping Use   Vaping Use: Never used  Substance and Sexual Activity   Alcohol use: Yes    Comment: occ   Drug use: No   Sexual activity: Never  Other Topics Concern   Not on file  Social History Narrative   Lives with wife.  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   Social Determinants of Health   Financial Resource Strain: High Risk   Difficulty of Paying Living Expenses: Hard  Food Insecurity: No Food Insecurity   Worried About Charity fundraiser in the Last Year: Never true   Ran Out of Food in the Last Year: Never true   Transportation Needs: No Transportation Needs   Lack of Transportation (Medical): No   Lack of Transportation (Non-Medical): No  Physical Activity: Sufficiently Active   Days of Exercise per Week: 5 days   Minutes of Exercise per Session: 30 min  Stress: No Stress Concern Present   Feeling of Stress : Not at all  Social Connections: Moderately Isolated   Frequency of Communication with Friends and Family: More than three times a week   Frequency of Social Gatherings with Friends and Family: More than three times a week   Attends Religious Services: Never   Marine scientist or Organizations: No   Attends Archivist Meetings: Never   Marital Status: Married    Tobacco Counseling Ready  to quit: Not Answered Counseling given: Not Answered   Clinical Intake:  Pre-visit preparation completed: No  Pain : 0-10 Pain Score: 8  Pain Type: Acute pain Pain Location: Neck Pain Orientation: Upper Pain Radiating Towards: left upper extremity Pain Descriptors / Indicators: Aching, Discomfort Pain Onset: In the past 7 days Pain Frequency: Constant Pain Relieving Factors: Pain medication  Pain Relieving Factors: Pain medication  BMI - recorded: 25.45 Nutritional Status: BMI 25 -29 Overweight Nutritional Risks: None Diabetes: Yes CBG done?: No Did pt. bring in CBG monitor from home?: No  How often do you need to have someone help you when you read instructions, pamphlets, or other written materials from your doctor or pharmacy?: 1 - Never What is the last grade level you completed in school?: 11th grade in [place]  Diabetic? yes  Interpreter Needed?: No  Information entered by :: Lisette Abu, LPN   Activities of Daily Living In your present state of health, do you have any difficulty performing the following activities: 08/09/2021 10/20/2020  Hearing? N N  Vision? N Y  Difficulty concentrating or making decisions? N N  Walking or climbing stairs? N N   Dressing or bathing? N N  Doing errands, shopping? N N  Preparing Food and eating ? N -  Using the Toilet? N -  In the past six months, have you accidently leaked urine? N -  Do you have problems with loss of bowel control? N -  Managing your Medications? N -  Managing your Finances? N -  Housekeeping or managing your Housekeeping? N -  Some recent data might be hidden    Patient Care Team: [name], MD as PCP - General (Internal Medicine) Knox Royalty, RN as Case Manager [place], Darnelle Maffucci, [facility] as Pharmacist (Pharmacist)  Indicate any recent Medical Services you may have received from other than [facility] providers in the past year (date may be approximate).     Assessment:   This is a routine wellness examination for [facility].  Hearing/Vision screen Hearing Screening - Comments:: Patient denied any hearing difficulty.   No hearing aids.  Vision Screening - Comments:: Patient wears corrective glasses/contacts.  Eye exam done annually by: Rossie issues and exercise activities discussed: Current Exercise Habits: Home exercise routine, Type of exercise: walking, Time (Minutes): 30, Frequency (Times/Week): 5, Weekly Exercise (Minutes/Week): 150, Intensity: Moderate, Exercise limited by: respiratory conditions(s)   Goals Addressed   None   Depression Screen PHQ 2/9 Scores 08/09/2021 03/24/2021 01/14/2021 12/22/2020 10/22/2020 09/21/2020 08/21/2020  PHQ - 2 Score 0 0 0 0 0 0 0    Fall Risk Fall Risk  03/24/2021 01/14/2021 12/22/2020 10/22/2020 09/21/2020  Falls in the past year? 0 0 1 0 0  Number falls in past yr: 0 0 0 0 -  Injury with Fall? 0 0 0 - -  Comment - N/A- no falls reported - - -  Risk for fall due to : - No Fall Risks No Fall Risks No Fall Risks -  Follow up - Falls prevention discussed Falls evaluation completed Falls evaluation completed Falls evaluation completed    FALL RISK PREVENTION PERTAINING TO THE HOME:  Any stairs in or around the  home? Yes  If so, are there any without handrails? No  Home free of loose throw rugs in walkways, pet beds, electrical cords, etc? Yes  Adequate lighting in your home to reduce risk of falls? Yes   ASSISTIVE DEVICES UTILIZED TO PREVENT  FALLS:  Life alert? No  Use of a cane, walker or w/c? No  Grab bars in the bathroom? No  Shower chair or bench in shower? No  Elevated toilet seat or a handicapped toilet? No   TIMED UP AND GO:  Was the test performed? Yes .  Length of time to ambulate 10 feet: 6 sec.   Gait steady and fast without use of assistive device  Cognitive Function: Normal cognitive status assessed by direct observation by this Nurse Health Advisor. No abnormalities found.       6CIT Screen 08/09/2021  What Year? 0 points  What month? 0 points  What time? 0 points  Count back from 20 0 points  Months in reverse 0 points  Repeat phrase 0 points  Total Score 0    Immunizations Immunization History  Administered Date(s) Administered   Fluad Quad(high Dose 65+) 03/24/2021   Influenza,inj,Quad PF,6+ Mos 09/21/2015, 07/02/2018   Influenza-Unspecified 08/03/2020   PFIZER(Purple Top)SARS-COV-2 Vaccination 09/29/2019, 06/12/2020, 10/20/2020   Pneumococcal Conjugate-13 09/21/2020   Pneumococcal Polysaccharide-23 10/15/2014, 09/21/2015    TDAP status: Due, Education has been provided regarding the importance of this vaccine. Advised may receive this vaccine at local pharmacy or Health Dept. Aware to provide a copy of the vaccination record if obtained from local pharmacy or Health Dept. Verbalized acceptance and understanding.  Flu Vaccine status: Up to date  Pneumococcal vaccine status: Up to date  Covid-19 vaccine status: Completed vaccines  Qualifies for Shingles Vaccine? Yes   Zostavax completed No   Shingrix Completed?: No.    Education has been provided regarding the importance of this vaccine. Patient has been advised to call insurance company to determine out  of pocket expense if they have not yet received this vaccine. Advised may also receive vaccine at local pharmacy or Health Dept. Verbalized acceptance and understanding.  Screening Tests Health Maintenance  Topic Date Due   URINE MICROALBUMIN  Never done   Zoster Vaccines- Shingrix (1 of 2) Never done   COVID-19 Vaccine (4 - Booster for Pfizer series) 12/15/2020   Pneumonia Vaccine 44+ Years old (3 - PPSV23 if available, else PCV20) 09/21/2021   FOOT EXAM  08/21/2021 (Originally 08/22/1965)   TETANUS/TDAP  08/21/2021 (Originally 08/22/1974)   Hepatitis C Screening  08/21/2021 (Originally 08/22/1973)   HEMOGLOBIN A1C  09/21/2021   OPHTHALMOLOGY EXAM  11/09/2021   COLONOSCOPY (Pts 45-57yr Insurance coverage will need to be confirmed)  05/11/2023   INFLUENZA VACCINE  Completed   HIV Screening  Completed   HPV VACCINES  Aged Out    Health Maintenance  Health Maintenance Due  Topic Date Due   URINE MICROALBUMIN  Never done   Zoster Vaccines- Shingrix (1 of 2) Never done   COVID-19 Vaccine (4 - Booster for Pfizer series) 12/15/2020   Pneumonia Vaccine 66 Years old (3 - PPSV23 if available, else PCV20) 09/21/2021    Colorectal cancer screening: Type of screening: Colonoscopy. Completed 05/10/2018. Repeat every 5 years  Lung Cancer Screening: (Low Dose CT Chest recommended if Age 66-80years, 30 pack-year currently smoking OR have quit w/in 15years.) does not qualify.   Lung Cancer Screening Referral: no  Additional Screening:  Hepatitis C Screening: does qualify; Completed no  Vision Screening: Recommended annual ophthalmology exams for early detection of glaucoma and other disorders of the eye. Is the patient up to date with their annual eye exam?  Yes  Who is the provider or what is the name of the office in which  the patient attends annual eye exams? [facility] If pt is not established with a provider, would they like to be referred to a provider to establish care? No .    Dental Screening: Recommended annual dental exams for proper oral hygiene  Community Resource Referral / Chronic Care Management: CRR required this visit?  No   CCM required this visit?  No      Plan:     I have personally reviewed and noted the following in the patients chart:   Medical and social history Use of alcohol, tobacco or illicit drugs  Current medications and supplements including opioid prescriptions. Patient is currently taking opioid prescriptions. Information provided to patient regarding non-opioid alternatives. Patient advised to discuss non-opioid treatment plan with their provider. Functional ability and status Nutritional status Physical activity Advanced directives List of other physicians Hospitalizations, surgeries, and ER visits in previous 12 months Vitals Screenings to include cognitive, depression, and falls Referrals and appointments  In addition, I have reviewed and discussed with patient certain preventive protocols, quality metrics, and best practice recommendations. A written personalized care plan for preventive services as well as general preventive health recommendations were provided to patient.     Sheral Flow, LPN   01/09/3902   Nurse Notes:  Hearing Screening - Comments:: Patient denied any hearing difficulty.   No hearing aids.  Vision Screening - Comments:: Patient wears corrective glasses/contacts.  Eye exam done annually by: [facility]

## 2021-08-09 NOTE — Patient Instructions (Signed)
[name] , Thank you for taking time to come for your Medicare Wellness Visit. I appreciate your ongoing commitment to your health goals. Please review the following plan we discussed and let me know if I can assist you in the future.   Screening recommendations/referrals: Colonoscopy: 05/10/2018; due every 5 years (due 05/11/2023) Recommended yearly ophthalmology/optometry visit for glaucoma screening and checkup Recommended yearly dental visit for hygiene and checkup  Vaccinations: Influenza vaccine: 03/24/2021 Pneumococcal vaccine: 09/21/2015, 09/21/2020 Tdap vaccine: never done Shingles vaccine: never done   Covid-19: 09/09/2019, 06/12/2020, 10/20/2020  Advanced directives: Advance directive discussed with you today. Even though you declined this today please call our office should you change your mind and we can give you the proper paperwork for you to fill out.  Conditions/risks identified: Yes; Client understands the importance of follow-up with providers by attending scheduled visits and discussed goals to eat healthier, increase physical activity, exercise the brain, socialize more, get enough sleep and make time for laughter.  Next appointment: Please schedule your next Medicare Wellness Visit with your Nurse Health Advisor in 1 year by calling [phone].  Preventive Care 96 Years and Older, Male Preventive care refers to lifestyle choices and visits with your health care provider that can promote health and wellness. What does preventive care include? A yearly physical exam. This is also called an annual well check. Dental exams once or twice a year. Routine eye exams. Ask your health care provider how often you should have your eyes checked. Personal lifestyle choices, including: Daily care of your teeth and gums. Regular physical activity. Eating a healthy diet. Avoiding tobacco and drug use. Limiting alcohol use. Practicing safe sex. Taking low doses of aspirin every  day. Taking vitamin and mineral supplements as recommended by your health care provider. What happens during an annual well check? The services and screenings done by your health care provider during your annual well check will depend on your age, overall health, lifestyle risk factors, and family history of disease. Counseling  Your health care provider may ask you questions about your: Alcohol use. Tobacco use. Drug use. Emotional well-being. Home and relationship well-being. Sexual activity. Eating habits. History of falls. Memory and ability to understand (cognition). Work and work Statistician. Screening  You may have the following tests or measurements: Height, weight, and BMI. Blood pressure. Lipid and cholesterol levels. These may be checked every 5 years, or more frequently if you are over 38 years old. Skin check. Lung cancer screening. You may have this screening every year starting at age 80 if you have a 30-pack-year history of smoking and currently smoke or have quit within the past 15 years. Fecal occult blood test (FOBT) of the stool. You may have this test every year starting at age 20. Flexible sigmoidoscopy or colonoscopy. You may have a sigmoidoscopy every 5 years or a colonoscopy every 10 years starting at age 27. Prostate cancer screening. Recommendations will vary depending on your family history and other risks. Hepatitis C blood test. Hepatitis B blood test. Sexually transmitted disease (STD) testing. Diabetes screening. This is done by checking your blood sugar (glucose) after you have not eaten for a while (fasting). You may have this done every 1-3 years. Abdominal aortic aneurysm (AAA) screening. You may need this if you are a current or former smoker. Osteoporosis. You may be screened starting at age 81 if you are at high risk. Talk with your health care provider about your test results, treatment options, and if necessary,  the need for more  tests. Vaccines  Your health care provider may recommend certain vaccines, such as: Influenza vaccine. This is recommended every year. Tetanus, diphtheria, and acellular pertussis (Tdap, Td) vaccine. You may need a Td booster every 10 years. Zoster vaccine. You may need this after age 70. Pneumococcal 13-valent conjugate (PCV13) vaccine. One dose is recommended after age 37. Pneumococcal polysaccharide (PPSV23) vaccine. One dose is recommended after age 71. Talk to your health care provider about which screenings and vaccines you need and how often you need them. This information is not intended to replace advice given to you by your health care provider. Make sure you discuss any questions you have with your health care provider. Document Released: 07/17/2015 Document Revised: 03/09/2016 Document Reviewed: 04/21/2015 Elsevier Interactive Patient Education  2017 [employer] Prevention in the Home Falls can cause injuries. They can happen to people of all ages. There are many things you can do to make your home safe and to help prevent falls. What can I do on the outside of my home? Regularly fix the edges of walkways and driveways and fix any cracks. Remove anything that might make you trip as you walk through a door, such as a raised step or threshold. Trim any bushes or trees on the path to your home. Use bright outdoor lighting. Clear any walking paths of anything that might make someone trip, such as rocks or tools. Regularly check to see if handrails are loose or broken. Make sure that both sides of any steps have handrails. Any raised decks and porches should have guardrails on the edges. Have any leaves, snow, or ice cleared regularly. Use sand or salt on walking paths during winter. Clean up any spills in your garage right away. This includes oil or grease spills. What can I do in the bathroom? Use night lights. Install grab bars by the toilet and in the tub and shower.  Do not use towel bars as grab bars. Use non-skid mats or decals in the tub or shower. If you need to sit down in the shower, use a plastic, non-slip stool. Keep the floor dry. Clean up any water that spills on the floor as soon as it happens. Remove soap buildup in the tub or shower regularly. Attach bath mats securely with double-sided non-slip rug tape. Do not have throw rugs and other things on the floor that can make you trip. What can I do in the bedroom? Use night lights. Make sure that you have a light by your bed that is easy to reach. Do not use any sheets or blankets that are too big for your bed. They should not hang down onto the floor. Have a firm chair that has side arms. You can use this for support while you get dressed. Do not have throw rugs and other things on the floor that can make you trip. What can I do in the kitchen? Clean up any spills right away. Avoid walking on wet floors. Keep items that you use a lot in easy-to-reach places. If you need to reach something above you, use a strong step stool that has a grab bar. Keep electrical cords out of the way. Do not use floor polish or wax that makes floors slippery. If you must use wax, use non-skid floor wax. Do not have throw rugs and other things on the floor that can make you trip. What can I do with my stairs? Do not leave any items on  the stairs. Make sure that there are handrails on both sides of the stairs and use them. Fix handrails that are broken or loose. Make sure that handrails are as long as the stairways. Check any carpeting to make sure that it is firmly attached to the stairs. Fix any carpet that is loose or worn. Avoid having throw rugs at the top or bottom of the stairs. If you do have throw rugs, attach them to the floor with carpet tape. Make sure that you have a light switch at the top of the stairs and the bottom of the stairs. If you do not have them, ask someone to add them for you. What else  can I do to help prevent falls? Wear shoes that: Do not have high heels. Have rubber bottoms. Are comfortable and fit you well. Are closed at the toe. Do not wear sandals. If you use a stepladder: Make sure that it is fully opened. Do not climb a closed stepladder. Make sure that both sides of the stepladder are locked into place. Ask someone to hold it for you, if possible. Clearly mark and make sure that you can see: Any grab bars or handrails. First and last steps. Where the edge of each step is. Use tools that help you move around (mobility aids) if they are needed. These include: Canes. Walkers. Scooters. Crutches. Turn on the lights when you go into a dark area. Replace any light bulbs as soon as they burn out. Set up your furniture so you have a clear path. Avoid moving your furniture around. If any of your floors are uneven, fix them. If there are any pets around you, be aware of where they are. Review your medicines with your doctor. Some medicines can make you feel dizzy. This can increase your chance of falling. Ask your doctor what other things that you can do to help prevent falls. This information is not intended to replace advice given to you by your health care provider. Make sure you discuss any questions you have with your health care provider. Document Released: 04/16/2009 Document Revised: 11/26/2015 Document Reviewed: 07/25/2014 Elsevier Interactive Patient Education  2017 [employer].

## 2021-08-10 ENCOUNTER — Ambulatory Visit (INDEPENDENT_AMBULATORY_CARE_PROVIDER_SITE_OTHER): Payer: Medicare HMO | Admitting: Emergency Medicine

## 2021-08-10 ENCOUNTER — Encounter: Payer: Self-pay | Admitting: Emergency Medicine

## 2021-08-10 VITALS — BP 130/78 | HR 99 | Temp 98.0°F | Ht 68.0 in | Wt 168.0 lb

## 2021-08-10 DIAGNOSIS — E1159 Type 2 diabetes mellitus with other circulatory complications: Secondary | ICD-10-CM | POA: Diagnosis not present

## 2021-08-10 DIAGNOSIS — L723 Sebaceous cyst: Secondary | ICD-10-CM

## 2021-08-10 DIAGNOSIS — E1165 Type 2 diabetes mellitus with hyperglycemia: Secondary | ICD-10-CM | POA: Diagnosis not present

## 2021-08-10 DIAGNOSIS — J431 Panlobular emphysema: Secondary | ICD-10-CM | POA: Diagnosis not present

## 2021-08-10 DIAGNOSIS — I152 Hypertension secondary to endocrine disorders: Secondary | ICD-10-CM | POA: Diagnosis not present

## 2021-08-10 DIAGNOSIS — E785 Hyperlipidemia, unspecified: Secondary | ICD-10-CM

## 2021-08-10 DIAGNOSIS — E1169 Type 2 diabetes mellitus with other specified complication: Secondary | ICD-10-CM | POA: Diagnosis not present

## 2021-08-10 LAB — POCT GLYCOSYLATED HEMOGLOBIN (HGB A1C): Hemoglobin A1C: 8.4 % — AB (ref 4.0–5.6)

## 2021-08-10 MED ORDER — TRAMADOL HCL 50 MG PO TABS: 50.0000 mg | ORAL_TABLET | Freq: Three times a day (TID) | ORAL | 0 refills | Status: AC | PRN

## 2021-08-10 MED ORDER — DAPAGLIFLOZIN PROPANEDIOL 10 MG PO TABS: 10.0000 mg | ORAL_TABLET | Freq: Every day | ORAL | 3 refills | Status: AC

## 2021-08-10 MED ORDER — TRULICITY 1.5 MG/0.5ML ~~LOC~~ SOAJ: 1.5000 mg | SUBCUTANEOUS | 3 refills | Status: DC

## 2021-08-10 NOTE — Assessment & Plan Note (Signed)
Well-controlled hypertension.  Continue amlodipine 5 mg daily. BP Readings from Last 3 Encounters:  08/10/21 130/78  08/09/21 130/80  08/06/21 110/80  Uncontrolled diabetes with hemoglobin A1c at 8.4. Continue metformin 1000 mg twice a day. Increase Trulicity to 1.5 mg weekly and start Farxiga 10 mg daily. Diet and nutrition discussed. Follow-up in 3 months.

## 2021-08-10 NOTE — Progress Notes (Signed)
Duane Cuevas 66 y.o.   Chief Complaint  Patient presents with   Neck Pain    Neck pain x 2 month , no OTC meds , taking muscle relaxant not working     HISTORY OF PRESENT ILLNESS: This is a 66 y.o. male here for follow-up of emergency department visit on 08/06/2021 when he was seen for atypical chest pain.  In fact he was having left upper neck pain secondary to inflamed sebaceous cyst.  Cardiac work-up was negative.  Was given short supply of hydrocodone which helped the pain. Also has history of uncontrolled diabetes.  States his sugars have been in the 400 range. Also has history of COPD, mostly emphysema.  Breztri working well for him. Lab Results  Component Value Date   HGBA1C 7.6 (A) 03/24/2021   BP Readings from Last 3 Encounters:  08/10/21 130/78  08/09/21 130/80  08/06/21 110/80     HPI   Prior to Admission medications   Medication Sig Start Date End Date Taking? Authorizing Provider  albuterol (PROVENTIL) (2.5 MG/3ML) 0.083% nebulizer solution USE 1 VIAL VIA NEBULIZER EVERY 6 HOURS AS NEEDED FOR WHEEZING OR SHORTNESS OF BREATH 05/24/21  Yes Juliyah Mergen, Ines Bloomer, MD  albuterol (VENTOLIN HFA) 108 (90 Base) MCG/ACT inhaler Inhale 2 puffs into the lungs every 6 (six) hours as needed for wheezing or shortness of breath. 04/15/21  Yes Brytnee Bechler, Ines Bloomer, MD  amLODipine (NORVASC) 5 MG tablet Take 1 tablet (5 mg total) by mouth daily. 12/22/20  Yes Naudia Crosley, Ines Bloomer, MD  atorvastatin (LIPITOR) 20 MG tablet TAKE ONE TABLET BY MOUTH ONE TIME DAILY 08/09/21  Yes Merton Wadlow, Ines Bloomer, MD  blood glucose meter kit and supplies KIT Dispense based on patient and insurance preference. Use up to four times daily as directed. 10/22/20  Yes Daily Doe, Ines Bloomer, MD  blood glucose meter kit and supplies Dispense based on patient and insurance preference. Use up to four times daily as directed. (FOR ICD-10 E10.9, E11.9). 10/24/20  Yes Mical Kicklighter, Ines Bloomer, MD   Budeson-Glycopyrrol-Formoterol (BREZTRI AEROSPHERE) 160-9-4.8 MCG/ACT AERO Inhale 2 puffs into the lungs 2 (two) times daily. 07/22/21  Yes Sissi Padia, Ines Bloomer, MD  Dulaglutide (TRULICITY) 4.49 QP/5.9FM SOPN Inject 0.75 mg into the skin once a week. [facility] 07/22/21  Yes Roan Miklos, Ines Bloomer, MD  glipiZIDE (GLUCOTROL) 10 MG tablet Take 1 tablet (10 mg total) by mouth daily before breakfast. Takes 1 more tablet if blood sugar is high Patient taking differently: Take 10 mg by mouth 2 (two) times daily with a meal. Takes 1 more tablet if blood sugar is high 05/17/21  Yes Arlett Goold, Ines Bloomer, MD  glucose blood (ACCU-CHEK GUIDE) test strip Use as instructed 10/22/20  Yes Deondra Labrador, Ines Bloomer, MD  HYDROcodone-acetaminophen (NORCO/VICODIN) 5-325 MG tablet Take 1 tablet by mouth every 6 (six) hours as needed. 08/06/21  Yes Hayden Rasmussen, MD  metFORMIN (GLUCOPHAGE) 1000 MG tablet TAKE ONE TABLET BY MOUTH TWICE A DAY WITH A MEAL 06/29/21  Yes Meriem Lemieux, Ines Bloomer, MD  methocarbamol (ROBAXIN) 500 MG tablet Take 1 tablet (500 mg total) by mouth 2 (two) times daily. Patient taking differently: Take 500 mg by mouth 2 (two) times daily as needed. 03/25/21  Yes Blanchie Dessert, MD  ondansetron (ZOFRAN ODT) 4 MG disintegrating tablet 37m ODT q4 hours prn nausea/vomit 10/28/20  Yes FDeno Etienne DO  predniSONE (DELTASONE) 20 MG tablet Take 3 tablets (60 mg total) by mouth daily. 08/06/21  Yes BHayden Rasmussen MD  SYMBICORT 160-4.5 MCG/ACT inhaler INHALE TWO PUFFS BY MOUTH TWICE A DAY 06/29/21  Yes Price Lachapelle, [place], MD  Ubrogepant (UBRELVY) 100 MG TABS Take 1 tablet by mouth daily. 05/26/21  Yes Janith Lima, MD  pantoprazole (PROTONIX) 40 MG tablet Take 1 tablet (40 mg total) by mouth daily. 03/24/21 06/22/21  [name], MD    No Known Allergies  Patient Active Problem List   Diagnosis Date Noted   Neck pain on left side 05/26/2021   Intractable persistent migraine aura without  cerebral infarction and with status migrainosus 05/26/2021   Hypertension associated with diabetes ([place]) 09/21/2020   Other dysphagia    Hyperglycemia 09/13/2020   Uncontrolled type 2 diabetes mellitus with hyperglycemia ([place]) 09/13/2020   CAD (coronary artery disease) 01/23/2020   Hyperlipidemia 11/02/2019   History of MI (myocardial infarction) 11/02/2019   Nonspecific chest pain 11/01/2019   Cigarette smoker 08/15/2018   COPD ? GOLD III/ active smoker 08/14/2018   History of diet-controlled diabetes 06/30/2018   COPD (chronic obstructive pulmonary disease) ([place]) 10/14/2014   Type 2 diabetes mellitus with hyperglycemia, without long-term current use of insulin ([place]) 10/14/2014    Past Medical History:  Diagnosis Date   Diabetes mellitus ([place]) 10/2014   pt denies being diabetic.    Emphysema of lung ([place])    Emphysema/COPD ([place]) 10/2014   Hepatic steatosis    Thrombocytopenia ([place]) 09/2015   platelets in 120s.     Past Surgical History:  Procedure Laterality Date   BIOPSY  09/16/2020   Procedure: BIOPSY;  Surgeon: Thornton Park, MD;  Location: [facility];  Service: Gastroenterology;;   ESOPHAGOGASTRODUODENOSCOPY N/A 09/23/2015   Procedure: ESOPHAGOGASTRODUODENOSCOPY (EGD);  Surgeon: Ladene Artist, MD;  Location: [facility];  Service: Endoscopy;  Laterality: N/A;   ESOPHAGOGASTRODUODENOSCOPY (EGD) WITH PROPOFOL N/A 09/16/2020   Procedure: ESOPHAGOGASTRODUODENOSCOPY (EGD) WITH PROPOFOL;  Surgeon: Thornton Park, MD;  Location: [facility];  Service: Gastroenterology;  Laterality: N/A;   FOOT SURGERY     INGUINAL HERNIA REPAIR Left 05/15/2018   Procedure: LEFT INGUINAL HERNIA REPAIR WITH MESH;  Surgeon: Erroll Luna, MD;  Location: [facility];  Service: General;  Laterality: Left;   INSERTION OF MESH Left 05/15/2018   Procedure: INSERTION OF MESH;  Surgeon: Erroll Luna, MD;  Location: [facility];  Service: General;  Laterality: Left;   UPPER GASTROINTESTINAL ENDOSCOPY       Social History   Socioeconomic History   Marital status: Married    Spouse name: Zoraida   Number of children: 6   Years of education: Not on file   Highest education level: Not on file  Occupational History   Occupation: Employed    Employer: [employer]  Tobacco Use   Smoking status: Every Day    Packs/day: 0.15    Years: 47.00    Pack years: 7.05    Types: Cigarettes   Smokeless tobacco: Never  Vaping Use   Vaping Use: Never used  Substance and Sexual Activity   Alcohol use: Yes    Comment: occ   Drug use: No   Sexual activity: Never  Other Topics Concern   Not on file  Social History Narrative   Lives with wife.  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   Social Determinants of Health   Financial Resource Strain: High Risk   Difficulty of Paying Living Expenses: Hard  Food Insecurity: No Food Insecurity   Worried About Running Out of Food in the Last Year: Never  true   Ran Out of Food in the Last Year: Never true  Transportation Needs: No Transportation Needs   Lack of Transportation (Medical): No   Lack of Transportation (Non-Medical): No  Physical Activity: Sufficiently Active   Days of Exercise per Week: 5 days   Minutes of Exercise per Session: 30 min  Stress: No Stress Concern Present   Feeling of Stress : Not at all  Social Connections: Moderately Isolated   Frequency of Communication with Friends and Family: More than three times a week   Frequency of Social Gatherings with Friends and Family: More than three times a week   Attends Religious Services: Never   Marine scientist or Organizations: No   Attends Music therapist: Never   Marital Status: Married  Human resources officer Violence: Not At Risk   Fear of Current or Ex-Partner: No   Emotionally Abused: No   Physically Abused: No   Sexually Abused: No    Family History  Problem Relation Age of Onset   Emphysema Mother    Diabetes Mother    Emphysema Father     Cancer Sister        Stomach cancer   Diabetes Sister    Stomach cancer Sister    Diabetes Brother    Colon cancer Neg Hx    Colon polyps Neg Hx    Esophageal cancer Neg Hx    Rectal cancer Neg Hx      Review of Systems  Constitutional: Negative.  Negative for chills and fever.  HENT: Negative.  Negative for congestion and sore throat.   Respiratory: Negative.  Negative for cough and shortness of breath.   Cardiovascular: Negative.  Negative for chest pain and palpitations.  Gastrointestinal: Negative.  Negative for abdominal pain, diarrhea, nausea and vomiting.  Genitourinary: Negative.   Skin: Negative.  Negative for rash.  Neurological:  Negative for dizziness and headaches.  All other systems reviewed and are negative.   Physical Exam Vitals reviewed.  Constitutional:      Appearance: Normal appearance.  HENT:     Head: Normocephalic.  Eyes:     Extraocular Movements: Extraocular movements intact.     Pupils: Pupils are equal, round, and reactive to light.  Cardiovascular:     Rate and Rhythm: Normal rate and regular rhythm.     Pulses: Normal pulses.     Heart sounds: Normal heart sounds.  Pulmonary:     Effort: Pulmonary effort is normal.     Breath sounds: Normal breath sounds.  Abdominal:     Palpations: Abdomen is soft.     Tenderness: There is no abdominal tenderness.  Musculoskeletal:     Cervical back: Normal range of motion and neck supple.  Skin:    General: Skin is warm and dry.     Capillary Refill: Capillary refill takes less than 2 seconds.     Findings: Lesion present.     Comments: Tender sebaceous cyst at left upper back at the base of the neck  Neurological:     General: No focal deficit present.     Mental Status: He is alert and oriented to person, place, and time.  Psychiatric:        Mood and Affect: Mood normal.        Behavior: Behavior normal.    Results for orders placed or performed in visit on 08/10/21 (from the past 24  hour(s))  POCT glycosylated hemoglobin (Hb A1C)     Status:  Abnormal   Collection Time: 08/10/21  3:48 PM  Result Value Ref Range   Hemoglobin A1C 8.4 (A) 4.0 - 5.6 %   HbA1c POC (<> result, manual entry)     HbA1c, POC (prediabetic range)     HbA1c, POC (controlled diabetic range)      ASSESSMENT & PLAN: A total of 47 minutes was spent with the patient and counseling/coordination of care regarding preparing for this visit, review of most recent office visit notes, review of most recent emergency department visit notes, review of all medications and changes made including increasing dose of Trulicity and adding [place] to daily regimen, education on nutrition, review of most recent blood work results including today's hemoglobin A1c, cardiovascular risks associated with uncontrolled diabetes, prognosis, documentation, and need for follow-up in 3 months.  Problem List Items Addressed This Visit       Cardiovascular and Mediastinum   Hypertension associated with diabetes ([place]) - Primary    Well-controlled hypertension.  Continue amlodipine 5 mg daily. BP Readings from Last 3 Encounters:  08/10/21 130/78  08/09/21 130/80  08/06/21 110/80  Uncontrolled diabetes with hemoglobin A1c at 8.4. Continue metformin 1000 mg twice a day. Increase Trulicity to 1.5 mg weekly and start Farxiga 10 mg daily. Diet and nutrition discussed. Follow-up in 3 months.       Relevant Medications   dapagliflozin propanediol (FARXIGA) 10 MG TABS tablet   Dulaglutide (TRULICITY) 1.5 OI/5.1GF SOPN   Other Relevant Orders   Urine Microalbumin w/creat. ratio   POCT glycosylated hemoglobin (Hb A1C) (Completed)     Respiratory   COPD (chronic obstructive pulmonary disease) (HCC)    Well-controlled.  Continue Breztri 2 inhalations twice a day.        Endocrine   Dyslipidemia associated with type 2 diabetes mellitus ([place])    Stable.  Continue atorvastatin 20 mg daily.  Diet and nutrition discussed.        Relevant Medications   dapagliflozin propanediol (FARXIGA) 10 MG TABS tablet   Dulaglutide (TRULICITY) 1.5 QM/2.1IZ SOPN   Uncontrolled type 2 diabetes mellitus with hyperglycemia (HCC)   Relevant Medications   dapagliflozin propanediol (FARXIGA) 10 MG TABS tablet   Dulaglutide (TRULICITY) 1.5 XY/8.1VW SOPN     Musculoskeletal and Integument   Sebaceous cyst    Very tender and affecting quality of life.  Not infected.  No surrounding cellulitis. Needs referral to dermatology for removal. Treat pain with tramadol as needed.      Relevant Medications   traMADol (ULTRAM) 50 MG tablet   Other Relevant Orders   Ambulatory referral to Dermatology   Patient Instructions  Diabetes mellitus y nutricin, en adultos Diabetes Mellitus and Nutrition, Adult Si sufre de diabetes, o diabetes mellitus, es muy importante tener hbitos alimenticios saludables debido a que sus niveles de Designer, television/film set sangre (glucosa) se ven afectados en gran medida por lo que come y bebe. [place] alimentos saludables en las cantidades correctas, aproximadamente a la misma hora todos los [place], [place] ayudar a: Chief Technology Officer su glucemia. Disminuir el riesgo de sufrir una enfermedad cardaca. Mejorar la presin arterial. Science writer o mantener un peso saludable. Qu puede afectar mi plan de alimentacin? Todas las personas que sufren de diabetes son diferentes y cada una tiene necesidades diferentes en cuanto a un plan de alimentacin. El mdico puede recomendarle que trabaje con un nutricionista para elaborar el mejor plan para usted. Su plan de alimentacin puede variar segn factores como: Las caloras que necesita. Los medicamentos que toma. Su  peso. Sus niveles de glucemia, presin arterial y colesterol. Su nivel de [place]. Otras afecciones que tenga, como enfermedades cardacas o renales. Cmo me afectan los carbohidratos? Los carbohidratos, o hidratos de carbono, afectan su nivel de glucemia ms que cualquier otro tipo  de alimento. La ingesta de carbohidratos aumenta la cantidad de [employer]. Es importante conocer la cantidad de carbohidratos que se pueden ingerir en cada comida sin correr Engineer, manufacturing. Esto es Psychologist, forensic. Su nutricionista puede ayudarlo a calcular la cantidad de carbohidratos que debe ingerir en cada comida y en cada refrigerio. Cmo me afecta el alcohol? El alcohol puede provocar una disminucin de la glucemia (hipoglucemia), especialmente si [place] insulina o toma determinados medicamentos por va oral para la diabetes. La hipoglucemia es una afeccin potencialmente mortal. Los sntomas de la hipoglucemia, como somnolencia, mareos y confusin, son similares a los sntomas de haber consumido demasiado alcohol. No beba alcohol si: Su mdico le indica no hacerlo. Est embarazada, puede estar embarazada o est tratando de [place]. Si bebe alcohol: Limite la cantidad que bebe a lo siguiente: De 0 a 1 medida por da para las mujeres. De 0 a 2 medidas por da para los hombres. Sepa cunta cantidad de alcohol hay en las bebidas que toma. En los [place], una medida equivale a una botella de cerveza de 12 oz (355 ml), un vaso de vino de 5 oz (148 ml) o un vaso de una bebida alcohlica de alta graduacin de 1 oz (44 ml). Mantngase hidratado bebiendo agua, refrescos dietticos o t helado sin azcar. Tenga en cuenta que los refrescos comunes, los jugos y otras bebidas para mezclar pueden contener Freight forwarder y se deben contar como carbohidratos. Consejos para seguir Photographer las etiquetas de los alimentos Comience por leer el tamao de la porcin en la etiqueta de Informacin nutricional de los alimentos envasados y las bebidas. La cantidad de caloras, carbohidratos, grasas y otros nutrientes detallados en la etiqueta se basan en una porcin del alimento. Muchos alimentos contienen ms de una porcin por envase. Verifique la cantidad total de gramos (g) de  carbohidratos totales en una porcin. Verifique la cantidad de gramos de grasas saturadas y grasas trans en una porcin. Escoja alimentos que no contengan estas grasas o que su contenido de estas sea [place]. Verifique la cantidad de miligramos (mg) de sal (sodio) en una porcin. La State Farm de las personas deben limitar la ingesta de sodio total a menos de 2300 mg Honeywell. Siempre consulte la informacin nutricional de los alimentos etiquetados como con bajo contenido de [place] o sin grasa. Estos alimentos pueden tener un mayor contenido de Location manager agregada o carbohidratos refinados, y deben evitarse. Hable con su nutricionista para identificar sus objetivos diarios en cuanto a los nutrientes mencionados en la etiqueta. Al ir de compras Evite comprar alimentos procesados, enlatados o precocidos. Estos alimentos tienden a Special educational needs teacher mayor cantidad de [place], sodio y azcar agregada. Compre en la zona exterior de la tienda de comestibles. Esta es la zona donde se encuentran con mayor frecuencia las frutas y las verduras frescas, los cereales a granel, las carnes frescas y los productos lcteos frescos. Al cocinar Use mtodos de coccin a baja temperatura, como hornear, en lugar de mtodos de coccin a alta temperatura, como frer en abundante aceite. Cocine con aceites saludables, como el aceite de [place], canola o [place]. Evite cocinar con manteca, crema o carnes con alto contenido de grasa. Planificacin de las comidas  Coma las comidas y los refrigerios regularmente, preferentemente a la misma hora todos [place]. Evite pasar largos perodos de tiempo sin comer. Consuma alimentos ricos en fibra, como frutas frescas, verduras, frijoles y cereales integrales. Consuma entre 4 y 6 onzas (entre 112 y 168 g) de protenas magras por da, como carnes [place], pollo, pescado, huevos o tofu. Una onza (oz) (28 g) de protena magra equivale a: 1 onza (28 g) de carne, pollo o pescado. 1 huevo.  taza (62 g) de  tofu. Coma algunos alimentos por da que contengan grasas saludables, como aguacates, frutos secos, semillas y pescado. Qu alimentos debo comer? Lambert Mody Bayas. Manzanas. Naranjas. Duraznos. Damascos. Ciruelas. Uvas. Mangos. Papayas. Granadas. Kiwi. Cerezas. Verduras Verduras de Boeing, que incluyen [place], [place], col rizada, acelga, hojas de berza, hojas de mostaza y repollo. Remolachas. Coliflor. Brcoli. Zanahorias. Judas verdes. Tomates. Pimientos. Cebollas. Pepinos. Coles de Bruselas. Granos Granos integrales, como panes, galletas, tortillas, cereales y pastas de salvado o integrales. Avena sin azcar. Quinua. Arroz integral o salvaje. Carnes y otras protenas Frutos de mar. Carne de ave sin piel. Cortes magros de ave y carne de res. Tofu. Frutos secos. Semillas. Lcteos Productos lcteos sin grasa o con bajo contenido de [place], [place], yogur y [place]. Es posible que los productos detallados arriba no constituyan una lista completa de los alimentos y las bebidas que puede tomar. Consulte a un nutricionista para obtener ms informacin. Qu alimentos debo evitar? Lambert Mody Frutas enlatadas al almbar. Verduras Verduras enlatadas. Verduras congeladas con mantequilla o salsa de crema. Granos Productos elaborados con [place] y [place], como panes, pastas, bocadillos y cereales. Evite todos los alimentos procesados. Carnes y otras protenas Cortes de carne con alto contenido de Lobbyist. Carne de ave con piel. Carnes empanizadas o fritas. Carne procesada. Evite las grasas saturadas. Lcteos [place]ur, [place] enteros. Bebidas Bebidas azucaradas, como gaseosas o t helado. Es posible que los productos que se enumeran ms [place] no constituyan una lista completa de los alimentos y las bebidas que Nurse, adult. Consulte a un nutricionista para obtener ms informacin. Preguntas para hacerle al mdico Debo consultar con un especialista certificado en atencin y  educacin sobre la diabetes? Es necesario que me rena con un nutricionista? A qu nmero puedo llamar si tengo preguntas? Cules son los mejores momentos para controlar la glucemia? Dnde encontrar ms informacin: American Diabetes Association (Asociacin Estadounidense de la Diabetes): diabetes.org Academy of Nutrition and Dietetics (Academia de Nutricin y Information systems manager): eatright.Unisys Corporation of Diabetes and Digestive and Kidney Diseases (Floyd la Diabetes y Cos Cob y Renales): AmenCredit.is Association of Diabetes Care & Education Specialists (Asociacin de Especialistas en Atencin y Educacin sobre la Diabetes): diabeteseducator.org Resumen Es importante tener hbitos alimenticios saludables debido a que sus niveles de Designer, television/film set sangre (glucosa) se ven afectados en gran medida por lo que come y bebe. Es importante consumir alcohol con prudencia. Un plan de comidas saludable lo ayudar a controlar la glucosa en sangre y a reducir el riesgo de enfermedades cardacas. El mdico puede recomendarle que trabaje con un nutricionista para elaborar el mejor plan para usted. Esta informacin no tiene Marine scientist el consejo del mdico. Asegrese de hacerle al mdico cualquier pregunta que tenga. Document Revised: 02/26/2020 Document Reviewed: 02/26/2020 Elsevier Patient Education  2022 [place], MD [facility]

## 2021-08-10 NOTE — Assessment & Plan Note (Signed)
Well-controlled.  Continue Breztri 2 inhalations twice a day.

## 2021-08-10 NOTE — Assessment & Plan Note (Signed)
Very tender and affecting quality of life.  Not infected.  No surrounding cellulitis. Needs referral to dermatology for removal. Treat pain with tramadol as needed.

## 2021-08-10 NOTE — Patient Instructions (Signed)
Diabetes mellitus y nutricin, en adultos Diabetes Mellitus and Nutrition, Adult Si sufre de diabetes, o diabetes mellitus, es muy importante tener hbitos alimenticios saludables debido a que sus niveles de Designer, television/film set sangre (glucosa) se ven afectados en gran medida por lo que come y bebe. Comer alimentos saludables en las cantidades correctas, aproximadamente a la misma hora todos los [place], [place] ayudar a: Chief Technology Officer su glucemia. Disminuir el riesgo de sufrir una enfermedad cardaca. Mejorar la presin arterial. Science writer o mantener un peso saludable. Qu puede afectar mi plan de alimentacin? Todas las personas que sufren de diabetes son diferentes y cada una tiene necesidades diferentes en cuanto a un plan de alimentacin. El mdico puede recomendarle que trabaje con un nutricionista para elaborar el mejor plan para usted. Su plan de alimentacin puede variar segn factores como: Las caloras que necesita. Los medicamentos que toma. Su peso. Sus niveles de glucemia, presin arterial y colesterol. Su nivel de [place]. Otras afecciones que tenga, como enfermedades cardacas o renales. Cmo me afectan los carbohidratos? Los carbohidratos, o hidratos de carbono, afectan su nivel de glucemia ms que cualquier otro tipo de alimento. La ingesta de carbohidratos aumenta la cantidad de [employer]. Es importante conocer la cantidad de carbohidratos que se pueden ingerir en cada comida sin correr Engineer, manufacturing. Esto es Psychologist, forensic. Su nutricionista puede ayudarlo a calcular la cantidad de carbohidratos que debe ingerir en cada comida y en cada refrigerio. Cmo me afecta el alcohol? El alcohol puede provocar una disminucin de la glucemia (hipoglucemia), especialmente si [place] insulina o toma determinados medicamentos por va oral para la diabetes. La hipoglucemia es una afeccin potencialmente mortal. Los sntomas de la hipoglucemia, como somnolencia, mareos y confusin, son  similares a los sntomas de haber consumido demasiado alcohol. No beba alcohol si: Su mdico le indica no hacerlo. Est embarazada, puede estar embarazada o est tratando de [place]. Si bebe alcohol: Limite la cantidad que bebe a lo siguiente: De 0 a 1 medida por da para las mujeres. De 0 a 2 medidas por da para los hombres. Sepa cunta cantidad de alcohol hay en las bebidas que toma. En los [place], una medida equivale a una botella de cerveza de 12 oz (355 ml), un vaso de vino de 5 oz (148 ml) o un vaso de una bebida alcohlica de alta graduacin de 1 oz (44 ml). Mantngase hidratado bebiendo agua, refrescos dietticos o t helado sin azcar. Tenga en cuenta que los refrescos comunes, los jugos y otras bebidas para mezclar pueden contener Freight forwarder y se deben contar como carbohidratos. Consejos para seguir Photographer las etiquetas de los alimentos Comience por leer el tamao de la porcin en la etiqueta de Informacin nutricional de los alimentos envasados y las bebidas. La cantidad de caloras, carbohidratos, grasas y otros nutrientes detallados en la etiqueta se basan en una porcin del alimento. Muchos alimentos contienen ms de una porcin por envase. Verifique la cantidad total de gramos (g) de carbohidratos totales en una porcin. Verifique la cantidad de gramos de grasas saturadas y grasas trans en una porcin. Escoja alimentos que no contengan estas grasas o que su contenido de estas sea [place]. Verifique la cantidad de miligramos (mg) de sal (sodio) en una porcin. [place] [employer] de las personas deben limitar la ingesta de sodio total a menos de 2300 mg [employer]. Siempre consulte la informacin nutricional de los alimentos etiquetados como con bajo contenido de [place] o sin grasa. Estos  alimentos pueden tener un mayor contenido de Location manager agregada o carbohidratos refinados, y deben evitarse. Hable con su nutricionista para identificar sus objetivos diarios en cuanto  a los nutrientes mencionados en la etiqueta. Al ir de compras Evite comprar alimentos procesados, enlatados o precocidos. Estos alimentos tienden a Special educational needs teacher mayor cantidad de [place], sodio y azcar agregada. Compre en la zona exterior de la tienda de comestibles. Esta es la zona donde se encuentran con mayor frecuencia las frutas y las verduras frescas, los cereales a granel, las carnes frescas y los productos lcteos frescos. Al cocinar Use mtodos de coccin a baja temperatura, como hornear, en lugar de mtodos de coccin a alta temperatura, como frer en abundante aceite. Cocine con aceites saludables, como el aceite de [place], canola o [place]. Evite cocinar con manteca, crema o carnes con alto contenido de grasa. Planificacin de las comidas Coma las comidas y los refrigerios regularmente, preferentemente a la misma hora todos [place]. Evite pasar largos perodos de tiempo sin comer. Consuma alimentos ricos en fibra, como frutas frescas, verduras, frijoles y cereales integrales. Consuma entre 4 y 6 onzas (entre 112 y 168 g) de protenas magras por da, como carnes [place], pollo, pescado, huevos o tofu. Una onza (oz) (28 g) de protena magra equivale a: 1 onza (28 g) de carne, pollo o pescado. 1 huevo.  taza (62 g) de tofu. Coma algunos alimentos por da que contengan grasas saludables, como aguacates, frutos secos, semillas y pescado. Qu alimentos debo comer? Lambert Mody Bayas. Manzanas. Naranjas. Duraznos. Damascos. Ciruelas. Uvas. Mangos. Papayas. Granadas. Kiwi. Cerezas. Verduras Verduras de [employer], que incluyen [place], [place], col rizada, acelga, hojas de berza, hojas de mostaza y repollo. Remolachas. Coliflor. Brcoli. Zanahorias. Judas verdes. Tomates. Pimientos. Cebollas. Pepinos. Coles de Bruselas. Granos Granos integrales, como panes, galletas, tortillas, cereales y pastas de salvado o integrales. Avena sin azcar. Quinua. Arroz integral o salvaje. Carnes y otras  protenas Frutos de mar. Carne de ave sin piel. Cortes magros de ave y carne de res. Tofu. Frutos secos. Semillas. Lcteos Productos lcteos sin grasa o con bajo contenido de [place], [place], yogur y [place]. Es posible que los productos detallados arriba no constituyan una lista completa de los alimentos y las bebidas que puede tomar. Consulte a un nutricionista para obtener ms informacin. Qu alimentos debo evitar? Lambert Mody Frutas enlatadas al almbar. Verduras Verduras enlatadas. Verduras congeladas con mantequilla o salsa de crema. Granos Productos elaborados con [place] y [place], como panes, pastas, bocadillos y cereales. Evite todos los alimentos procesados. Carnes y otras protenas Cortes de carne con alto contenido de Lobbyist. Carne de ave con piel. Carnes empanizadas o fritas. Carne procesada. Evite las grasas saturadas. Lcteos Yogur, [place] enteros. Bebidas Bebidas azucaradas, como gaseosas o t helado. Es posible que los productos que se enumeran ms [place] no constituyan una lista completa de los alimentos y las bebidas que Nurse, adult. Consulte a un nutricionista para obtener ms informacin. Preguntas para hacerle al mdico Debo consultar con un especialista certificado en atencin y educacin sobre la diabetes? Es necesario que me rena con un nutricionista? A qu nmero puedo llamar si tengo preguntas? Cules son los mejores momentos para controlar la glucemia? Dnde encontrar ms informacin: American Diabetes Association (Asociacin Estadounidense de la Diabetes): diabetes.org Academy of Nutrition and Dietetics (Academia de Nutricin y Information systems manager): eatright.Unisys Corporation of Diabetes and Digestive and Kidney Diseases ([place] la Diabetes y [place] y [place]): AmenCredit.is Association of Diabetes Care &  Education Specialists (Asociacin de Especialistas en Atencin y Educacin sobre la Diabetes):  diabeteseducator.org Resumen Es importante tener hbitos alimenticios saludables debido a que sus niveles de Designer, television/film set sangre (glucosa) se ven afectados en gran medida por lo que come y bebe. Es importante consumir alcohol con prudencia. Un plan de comidas saludable lo ayudar a controlar la glucosa en sangre y a reducir el riesgo de enfermedades cardacas. El mdico puede recomendarle que trabaje con un nutricionista para elaborar el mejor plan para usted. Esta informacin no tiene Marine scientist el consejo del mdico. Asegrese de hacerle al mdico cualquier pregunta que tenga. Document Revised: 02/26/2020 Document Reviewed: 02/26/2020 Elsevier Patient Education  2022 [employer].

## 2021-08-10 NOTE — Assessment & Plan Note (Signed)
Stable.  Continue atorvastatin 20 mg daily.  Diet and nutrition discussed.

## 2021-08-13 ENCOUNTER — Other Ambulatory Visit: Payer: Self-pay

## 2021-08-13 DIAGNOSIS — J449 Chronic obstructive pulmonary disease, unspecified: Secondary | ICD-10-CM

## 2021-08-13 MED ORDER — BUDESONIDE-FORMOTEROL FUMARATE 160-4.5 MCG/ACT IN AERO: INHALATION_SPRAY | RESPIRATORY_TRACT | 11 refills | Status: DC

## 2021-08-18 ENCOUNTER — Ambulatory Visit (INDEPENDENT_AMBULATORY_CARE_PROVIDER_SITE_OTHER): Payer: Medicare HMO

## 2021-08-18 DIAGNOSIS — J449 Chronic obstructive pulmonary disease, unspecified: Secondary | ICD-10-CM

## 2021-08-18 DIAGNOSIS — E1159 Type 2 diabetes mellitus with other circulatory complications: Secondary | ICD-10-CM

## 2021-08-18 DIAGNOSIS — E1165 Type 2 diabetes mellitus with hyperglycemia: Secondary | ICD-10-CM

## 2021-08-18 NOTE — Patient Instructions (Signed)
Visit Information  Following are the goals we discussed today:   Set My A1c target   Timeframe:  Long-Range Goal Priority:  High Start Date:  04/09/2021                           Expected End Date:  07/10/2022                    Follow Up Date 09/07/2021   - set target A1C <7.0% - attain a more stable blood sugar throughout the day     Why is this important?   Your target A1C is decided together by you and your doctor.  It is based on several things like your age and other health issues.  Plan: Telephone follow up appointment with care management team member scheduled for:  1 month The patient has been provided with contact information for the care management team and has been advised to call with any health related questions or concerns.   Tomasa Blase, PharmD Clinical Pharmacist, [facility]    Please call the care guide team at [phone] if you need to cancel or reschedule your appointment.   Patient verbalizes understanding of instructions and care plan provided today and agrees to view in [place]. Active MyChart status confirmed with patient.

## 2021-08-18 NOTE — Progress Notes (Signed)
Chronic Care Management Pharmacy Note  08/18/2021 Name:  Duane Cuevas MRN:  [id number] DOB:  January 09, 1956  Summary: - spoke with patient via translator service ID [phone] -Patient reports that since most recent ED visit he has been improving - notes that he has finished prednisone prescription - but BG today remains elevated into the 400's - (previously well controlled on trulicity averaging 157-262) -patient has not yet started on increased dose of trulicity / started farxiga  -Patient reports he has started breztri and feels that his breathing has improved since starting, not using albuterol inhaler as often   Recommendations/Changes made from today's visit: -Recommending for patient to increase glipizide to 43m BID at this time  (patient reports that after taking increased dose of glipizide latest BG was 144)- reviewed with patient proper BG testing technique, called pharmacy and confirmed that trulicity and farxiga will be delivered in the next 3-4 days so he will be able to start once received (patient will reduce glipizide back to 171mBID once able to start on farxiga/ increase trulicity) -Patient to reach out to office should BG remain elevated by the end of the week to discuss options to better control BG (likely elevated due to prednisone he had been taking previously)  Subjective: AnMaverick Cuevas an 89.o. year old male who is a primary patient of Sagardia, MiInes BloomerMD.  The CCM team was consulted for assistance with disease management and care coordination needs.    Engaged with patient by telephone for follow up visit in response to provider referral for pharmacy case management and/or care coordination services.   Consent to Services:  The patient was given the following information about Chronic Care Management services today, agreed to services, and gave verbal consent: 1. CCM service includes personalized support from designated clinical staff supervised by the  primary care provider, including individualized plan of care and coordination with other care providers 2. 24/7 contact phone numbers for assistance for urgent and routine care needs. 3. Service will only be billed when office clinical staff spend 20 minutes or more in a month to coordinate care. 4. Only one practitioner may furnish and bill the service in a calendar month. 5.The patient may stop CCM services at any time (effective at the end of the month) by phone call to the office staff. 6. The patient will be responsible for cost sharing (co-pay) of up to 20% of the service fee (after annual deductible is met). Patient agreed to services and consent obtained.  Patient Care Team: SaHorald PollenMD as PCP - General (Internal Medicine) ToKnox RoyaltyRN as Case Manager Ramadan Couey, DaDarnelle MaffucciRPAdvanced Care Hospital [facility]anas Pharmacist (Pharmacist)  Recent Office Visits: 08/10/2021 - Dr. [facility] follow [facility] - increase trulicity to 1.0.3TDeekly - start farxiga 1058maily - tramadol 48m65mr pain from sebaceous cyst on neck - referred to dermatology    Recent consult visits:  [facility]  08/06/2021 - Ed visit due to left sided chest pain - cardiac workup negative - rx'd prednisone 20mg48mly and vicodin on discharge - pain is left sided neck pain from sebaceous cyst on neck  Objective:  Lab Results  Component Value Date   CREATININE 0.88 08/06/2021   BUN 20 08/06/2021   GFRNONAA >60 08/06/2021   GFRAA 101 06/22/2020   NA 134 (L) 08/06/2021   K 4.0 08/06/2021   CALCIUM 9.4 08/06/2021   CO2 22  08/06/2021   GLUCOSE 264 (H) 08/06/2021    Lab Results  Component Value Date/Time   HGBA1C 8.4 (A) 08/10/2021 03:48 PM   HGBA1C 7.6 (A) 03/24/2021 09:59 AM   HGBA1C 8.9 (H) 04/28/2020 01:00 AM   HGBA1C 6.8 (H) 11/02/2019 05:58 AM    Last diabetic Eye exam:  Lab Results  Component Value Date/Time   HMDIABEYEEXA No Retinopathy 11/09/2020 02:50  PM    Last diabetic Foot exam:  No results found for: HMDIABFOOTEX   Lab Results  Component Value Date   CHOL 172 06/22/2020   HDL 48 06/22/2020   LDLCALC 102 (H) 06/22/2020   TRIG 126 06/22/2020   CHOLHDL 3.6 06/22/2020    Hepatic Function Latest Ref Rng & Units 10/21/2020 09/14/2020 09/10/2020  Total Protein 6.5 - 8.1 g/dL 6.9 6.7 7.7  Albumin 3.5 - 5.0 g/dL 3.6 3.9 4.3  AST 15 - 41 U/L _0 ALT 0 - 44 U/L 22 44 39  Alk Phosphatase 38 - 126 U/L 66 56 70  Total Bilirubin 0.3 - 1.2 mg/dL 0.9 0.6 0.4  Bilirubin, Direct 0.0 - 0.2 mg/dL - <0.1 <0.1    No results found for: TSH, FREET4  CBC Latest Ref Rng & Units 08/06/2021 04/14/2021 03/25/2021  WBC 4.0 - 10.5 K/uL 9.3 8.5 7.3  Hemoglobin 13.0 - 17.0 g/dL 15.7 15.0 16.0  Hematocrit 39.0 - 52.0 % 47.0 44.5 47.8  Platelets 150 - 400 K/uL 230 255 248    No results found for: VD25OH  Clinical ASCVD: Yes  The ASCVD Risk score (Arnett DK, et al., 2019) failed to calculate for the following reasons:   The patient has a prior MI or stroke diagnosis    Depression screen [facility] 2/9 08/09/2021 03/24/2021 01/14/2021  Decreased Interest 0 0 0  Down, Depressed, Hopeless 0 0 0  PHQ - 2 Score 0 0 0   Social History   Tobacco Use  Smoking Status Every Day   Packs/day: 0.15   Years: 47.00   Pack years: 7.05   Types: Cigarettes  Smokeless Tobacco Never   BP Readings from Last 3 Encounters:  08/10/21 130/78  08/09/21 130/80  08/06/21 110/80   Pulse Readings from Last 3 Encounters:  08/10/21 99  08/09/21 (!) 109  08/06/21 80   Wt Readings from Last 3 Encounters:  08/10/21 168 lb (76.2 kg)  08/09/21 167 lb 6.4 oz (75.9 kg)  08/06/21 175 lb (79.4 kg)   BMI Readings from Last 3 Encounters:  08/10/21 25.54 kg/m  08/09/21 25.45 kg/m  08/06/21 26.61 kg/m    Assessment/Interventions: Review of patient past medical history, allergies, medications, health status, including review of consultants reports, laboratory and other test  data, was performed as part of comprehensive evaluation and provision of chronic care management services.   SDOH:  (Social Determinants of Health) assessments and interventions performed: Yes  SDOH Screenings   Alcohol Screen: Low Risk    Last Alcohol Screening Score (AUDIT): 0  Depression (PHQ2-9): Low Risk    PHQ-2 Score: 0  Financial Resource Strain: High Risk   Difficulty of Paying Living Expenses: Hard  Food Insecurity: No Food Insecurity   Worried About Charity fundraiser in the Last Year: Never true   Ran Out of Food in the Last Year: Never true  Housing: Low Risk    Last Housing Risk Score: 0  Physical Activity: Sufficiently Active   Days of Exercise per Week: 5 days   Minutes of Exercise per  Session: 30 min  Social Connections: Moderately Isolated   Frequency of Communication with Friends and Family: More than three times a week   Frequency of Social Gatherings with Friends and Family: More than three times a week   Attends Religious Services: Never   Marine scientist or Organizations: No   Attends Music therapist: Never   Marital Status: Married  Stress: No Stress Concern Present   Feeling of Stress : Not at all  Tobacco Use: High Risk   Smoking Tobacco Use: Every Day   Smokeless Tobacco Use: Never   Passive Exposure: Not on file  Transportation Needs: No Transportation Needs   Lack of Transportation (Medical): No   Lack of Transportation (Non-Medical): No    CCM Care Plan  No Known Allergies  Medications Reviewed Today     Reviewed by [name], MD (Physician) on 08/10/21 at 1700  Med List Status: <None>   Medication Order Taking? Sig Documenting Provider Last Dose Status Informant  albuterol (PROVENTIL) (2.5 MG/3ML) 0.083% nebulizer solution [id number] Yes USE 1 VIAL VIA NEBULIZER EVERY 6 HOURS AS NEEDED FOR WHEEZING OR SHORTNESS OF BREATH Sagardia, Ines Bloomer, MD Taking Active   albuterol (VENTOLIN HFA) 108 (90 Base)  MCG/ACT inhaler [id number] Yes Inhale 2 puffs into the lungs every 6 (six) hours as needed for wheezing or shortness of breath. [name], MD Taking Active   amLODipine [facility]) 5 MG tablet [id number] Yes Take 1 tablet (5 mg total) by mouth daily. [name], MD Taking Active   atorvastatin (LIPITOR) 20 MG tablet [id number] Yes TAKE ONE TABLET BY MOUTH ONE TIME DAILY [name], MD Taking Active   blood glucose meter kit and supplies [id number] Yes Dispense based on patient and insurance preference. Use up to four times daily as directed. (FOR ICD-10 E10.9, E11.9). [name], MD Taking Active Multiple Informants           Med Note Blanch Media, CYNTHIA A   Tue Dec 22, 2020 10:03 AM) use  blood glucose meter kit and supplies KIT [id number] Yes Dispense based on patient and insurance preference. Use up to four times daily as directed. [name], MD Taking Active Multiple Informants           Med Note Blanch Media, CYNTHIA A   Tue Dec 22, 2020 10:03 AM) use  Budeson-Glycopyrrol-Formoterol (BREZTRI AEROSPHERE) 160-9-4.8 MCG/ACT AERO [id number] Yes Inhale 2 puffs into the lungs 2 (two) times daily. [name], MD Taking Active   dapagliflozin propanediol (FARXIGA) 10 MG TABS tablet [id number] Yes Take 1 tablet (10 mg total) by mouth daily before breakfast. [name], MD  Active   Dulaglutide (TRULICITY) 1.5 IF/5.3PH [facility] [id number] Yes Inject 1.5 mg into the skin once a week. [name], MD  Active   glipiZIDE (GLUCOTROL) 10 MG tablet [id number] Yes Take 1 tablet (10 mg total) by mouth daily before breakfast. Takes 1 more tablet if blood sugar is high  Patient taking differently: Take 10 mg by mouth 2 (two) times daily with a meal. Takes 1 more tablet if blood sugar is high   Sagardia, Ines Bloomer, MD Taking Active   glucose blood (ACCU-CHEK GUIDE) test strip [id number] Yes Use as instructed [name], MD Taking Active  Multiple Informants           Med Note Blanch Media, CYNTHIA A   Tue Dec 22, 2020 10:03 AM) use  metFORMIN (GLUCOPHAGE) 1000 MG tablet [id number] Yes  TAKE ONE TABLET BY MOUTH TWICE A DAY WITH A MEAL Sagardia, Ines Bloomer, MD Taking Active   methocarbamol (ROBAXIN) 500 MG tablet [id number] Yes Take 1 tablet (500 mg total) by mouth 2 (two) times daily.  Patient taking differently: Take 500 mg by mouth 2 (two) times daily as needed.   Blanchie Dessert, MD Taking Active   morphine 2 MG/ML injection [id number]     Active   ondansetron (ZOFRAN ODT) 4 MG disintegrating tablet [id number] Yes 45m ODT q4 hours prn nausea/vomit FDeno Etienne DO Taking Active   pantoprazole (PROTONIX) 40 MG tablet [id number] Take 1 tablet (40 mg total) by mouth daily. SHorald Pollen MD  Expired 06/22/21 2359   predniSONE (DELTASONE) 20 MG tablet [id number] Take 3 tablets (60 mg total) by mouth daily. BHayden Rasmussen MD Taking Active   traMADol (Veatrice Bourbon 50 MG tablet [id number] Take 1 tablet (50 mg total) by mouth every 8 (eight) hours as needed for up to 5 days. SHorald Pollen MD  Active   Ubrogepant ([facility] 100 MG TABS [id number] Take 1 tablet by mouth daily. JJanith Lima MD Taking Active             Patient Active Problem List   Diagnosis Date Noted   Sebaceous cyst 08/10/2021   Neck pain on left side 05/26/2021   Intractable persistent migraine aura without cerebral infarction and with status migrainosus 05/26/2021   Hypertension associated with diabetes ([place] 09/21/2020   Other dysphagia    Hyperglycemia 09/13/2020   Uncontrolled type 2 diabetes mellitus with hyperglycemia ([place] 09/13/2020   CAD (coronary artery disease) 01/23/2020   Hyperlipidemia 11/02/2019   History of MI (myocardial infarction) 11/02/2019   Nonspecific chest pain 11/01/2019   Cigarette smoker 08/15/2018   COPD ? GOLD III/ active smoker 08/14/2018   History of diet-controlled diabetes 06/30/2018   COPD (chronic  obstructive pulmonary disease) ([place] 10/14/2014   Dyslipidemia associated with type 2 diabetes mellitus ([place] 10/14/2014    Immunization History  Administered Date(s) Administered   Fluad Quad(high Dose 65+) 03/24/2021   Influenza,inj,Quad PF,6+ Mos 09/21/2015, 07/02/2018   Influenza-Unspecified 08/03/2020   PFIZER(Purple Top)SARS-COV-2 Vaccination 09/29/2019, 06/12/2020, 10/20/2020   Pneumococcal Conjugate-13 09/21/2020   Pneumococcal Polysaccharide-23 10/15/2014, 09/21/2015    Conditions to be addressed/monitored:  Hypertension, Hyperlipidemia, Diabetes, GERD, and COPD  Care Plan : CSeminole Updates made by STomasa Blase RToccoasince 08/18/2021 12:00 AM     Problem: HTN, CAD, HLD, COPD, DM2, GERD   Priority: High  Onset Date: 04/09/2021     Long-Range Goal: Disease Management   Start Date: 04/09/2021  Expected End Date: 10/08/2021  Recent Progress: On track  Priority: High  Note:   Current Barriers:  Unable to independently afford treatment regimen Unable to independently monitor therapeutic efficacy  Pharmacist Clinical Goal(s):  Patient will verbalize ability to afford treatment regimen achieve adherence to monitoring guidelines and medication adherence to achieve therapeutic efficacy achieve improvement in COPD and DM as evidenced by decreased rescue inhaler use / more stable BG through collaboration with PharmD and provider.   Interventions: 1:1 collaboration with SHorald Pollen MD regarding development and update of comprehensive plan of care as evidenced by provider attestation and co-signature Inter-disciplinary care team collaboration (see longitudinal plan of care) Comprehensive medication review performed; medication list updated in electronic medical record  Hypertension (BP goal <130/80) -Controlled -Current treatment: Amlodipine 563m- 1 tablet daily  -Medications previously tried: n/a  -Current  home readings: n/a BP Readings from Last 3  Encounters:  08/10/21 130/78  08/09/21 130/80  08/06/21 110/80  -Current dietary habits: notes to adding salt to his food, does not watch sodium intake  -Current exercise habits: walks daily - mostly around/ inside the house  -Denies hypotensive/hypertensive symptoms -Educated on BP goals and benefits of medications for prevention of heart attack, stroke and kidney damage; Daily salt intake goal < 2300 mg; Exercise goal of 150 minutes per week; Importance of home blood pressure monitoring; Proper BP monitoring technique; -Counseled on diet and exercise extensively Recommended to continue current medication  Hyperlipidemia: (LDL goal < 70) -Not ideally controlled Lab Results  Component Value Date   LDLCALC 102 (H) 06/22/2020  -Current treatment: Atorvastatin 20mg  - 1 tablet daily  -Medications previously tried: n/a  -Current dietary patterns: fried foods on occasion - usually will eat fried chicken with rice and beans  -Current exercise habits: walks daily - mostly around/ inside the house  -Educated on Cholesterol goals;  Benefits of statin for ASCVD risk reduction; Importance of limiting foods high in cholesterol; Exercise goal of 150 minutes per week; -Counseled on diet and exercise extensively -repeat lipid panel with next PCP visit, should LDL remain elevated - increase statin intensity    Diabetes (A1c goal <7%) -Improving - able to start trulicity  Lab Results  Component Value Date   HGBA1C 8.4 (A) 08/10/2021  -Current medications: Glipizide 10mg  - 1 tablet daily  Metformin 1000mg  - 1 tablet twice daily  Trulicity 1.5mg  weekly  Farxiga 10mg  daily  -Medications previously tried: lantus, novolog, tresiba  -Current home glucose readings fasting glucose: 833-825 when on trulicity - since taking prednisone has been increased around 400-500 (has not yet started farxiga / increased trulicity - confirmed with pharmacy he will receive within the next 3-4 days) -Current meal  patterns:  breakfast: oatmeal, small sandwich, coffee  lunch: vegetables with fish  dinner: rice/ beans wit meat  snacks: small sandwich, plantains, fruit  drinks: diet soda  -Current exercise: walks daily - mostly around/ inside the house  -Educated on A1c and blood sugar goals; Complications of diabetes including kidney damage, retinal damage, and cardiovascular disease; Exercise goal of 150 minutes per week; Benefits of weight loss; Proper insulin injection technique; Prevention and management of hypoglycemic episodes; Benefits of routine self-monitoring of blood sugar; Carbohydrate counting and/or plate method -Counseled to check feet daily and get yearly eye exams -Counseled on diet and exercise extensively Recommended for patient to continue checking BG TID - patient will increase glipizide to 20mg  BID - will plan to reduce back to 10mg  BID once trulicity is increased and farxiga is started  -Patient reports that after taking increased dose of glipizide BG had decreased to 144 - reviewed with patient BG goals / how to appropriately correct low blood sugars   COPD (Goal: control symptoms and prevent exacerbations) -Improved - notes that since starting breztri he has been feeling his breathing has improved, decreased albuterol use -Current treatment  Breztri 2 puffs BID  Albuterol 155mcg/act - 2 puffs every 4 hours as needed  - using every 4 hours  Albuterol 0.0083% nebulizer solution - 1 vial every 6 hours as needed - using 2-4 times daily  -Medications previously tried: symbicort  -Gold Grade: Gold 3 (FEV1 30-49%) -Current COPD Classification:  D (high sx, >/=2 exacerbations/yr) -MMRC/CAT score: not available -Pulmonary function testing: 08/14/2018 -Exacerbations requiring treatment in last 6 months: 2 -Patient reports consistent use of maintenance inhaler -Frequency  of rescue inhaler use: frequency of use has decreased since starting breztri  -Counseled on Proper inhaler  technique; Benefits of consistent maintenance inhaler use When to use rescue inhaler Differences between maintenance and rescue inhalers -Recommended for patient to continue current medications   GERD (Goal: Prevention / control of acid reflux) -Controlled -Current treatment  Pantoprazole 32m - 1 tablet daily  -Medications previously tried: n/a  -Counseled on diet and exercise extensively Recommended to continue current medication  Patient Goals/Self-Care Activities Patient will:  - take medications as prescribed check glucose at least 3 times daily, document, and provide at future appointments collaborate with provider on medication access solutions target a minimum of 150 minutes of moderate intensity exercise weekly engage in dietary modifications by reducing sodium intake / moderation of carbohydrate intake  Follow Up Plan: Telephone follow up appointment with care management team member scheduled for: 1 month The patient has been provided with contact information for the care management team and has been advised to call with any health related questions or concerns.       Patient's preferred pharmacy is:  [facility] [id number] [address] [place]Phone: [phone]: [phone] [facility] [address]Phone: [phone]: [phone] [address]Phone: [phone]: [phone] [address] [address]Phone: [phone]: [phone]  Uses pill box? Yes Pt endorses 100% compliance  Care  Plan and Follow Up Patient Decision:  Patient agrees to Care Plan and Follow-up.  Plan: Telephone follow up appointment with care management team member scheduled for:  1 month and The patient has been provided with contact information for the care management team and has been advised to call with any health related questions or concerns.   DTomasa Blase PharmD Clinical Pharmacist, [place]

## 2021-08-23 ENCOUNTER — Ambulatory Visit: Payer: Medicare HMO | Admitting: Emergency Medicine

## 2021-08-31 DIAGNOSIS — J449 Chronic obstructive pulmonary disease, unspecified: Secondary | ICD-10-CM

## 2021-08-31 DIAGNOSIS — E1165 Type 2 diabetes mellitus with hyperglycemia: Secondary | ICD-10-CM

## 2021-08-31 DIAGNOSIS — I152 Hypertension secondary to endocrine disorders: Secondary | ICD-10-CM

## 2021-08-31 DIAGNOSIS — E1159 Type 2 diabetes mellitus with other circulatory complications: Secondary | ICD-10-CM

## 2021-09-01 DIAGNOSIS — L720 Epidermal cyst: Secondary | ICD-10-CM | POA: Diagnosis not present

## 2021-09-07 ENCOUNTER — Ambulatory Visit (INDEPENDENT_AMBULATORY_CARE_PROVIDER_SITE_OTHER): Payer: Medicare HMO | Admitting: *Deleted

## 2021-09-07 DIAGNOSIS — J449 Chronic obstructive pulmonary disease, unspecified: Secondary | ICD-10-CM

## 2021-09-07 DIAGNOSIS — E1165 Type 2 diabetes mellitus with hyperglycemia: Secondary | ICD-10-CM

## 2021-09-07 NOTE — Chronic Care Management (AMB) (Signed)
Chronic Care Management   CCM RN Visit Note  09/07/2021 Name: Duane Cuevas MRN: [id number] DOB: 25-Jul-1955  Subjective: Duane Cuevas is a 66 y.o. year old male who is a primary care patient of Sagardia, Ines Bloomer, MD. The care management team was consulted for assistance with disease management and care coordination needs.    Engaged with patient by telephone for follow up visit in response to provider referral for case management and/or care coordination services.   Consent to Services:  The patient was given information about Chronic Care Management services, agreed to services, and gave verbal consent prior to initiation of services.  Please see initial visit note for detailed documentation.  Patient agreed to services and verbal consent obtained.   Assessment: Review of patient past medical history, allergies, medications, health status, including review of consultants reports, laboratory and other test data, was performed as part of comprehensive evaluation and provision of chronic care management services.   SDOH (Social Determinants of Health) assessments and interventions performed:  SDOH Interventions    Flowsheet Row Most Recent Value  SDOH Interventions   Food Insecurity Interventions Intervention Not Indicated  [Denies food insecurity today]  Housing Interventions Intervention Not Indicated  Transportation Interventions Intervention Not Indicated  [Reports daughter continues to provide transportation]        CCM Care Plan  No Known Allergies  Outpatient Encounter Medications as of 09/07/2021  Medication Sig Note   albuterol (PROVENTIL) (2.5 MG/3ML) 0.083% nebulizer solution USE 1 VIAL VIA NEBULIZER EVERY 6 HOURS AS NEEDED FOR WHEEZING OR SHORTNESS OF BREATH    albuterol (VENTOLIN HFA) 108 (90 Base) MCG/ACT inhaler Inhale 2 puffs into the lungs every 6 (six) hours as needed for wheezing or shortness of breath.    amLODipine (NORVASC) 5 MG tablet Take 1 tablet  (5 mg total) by mouth daily.    atorvastatin (LIPITOR) 20 MG tablet TAKE ONE TABLET BY MOUTH ONE TIME DAILY    blood glucose meter kit and supplies KIT Dispense based on patient and insurance preference. Use up to four times daily as directed. 12/22/2020: use   blood glucose meter kit and supplies Dispense based on patient and insurance preference. Use up to four times daily as directed. (FOR ICD-10 E10.9, E11.9). 12/22/2020: use   Budeson-Glycopyrrol-Formoterol (BREZTRI AEROSPHERE) 160-9-4.8 MCG/ACT AERO Inhale 2 puffs into the lungs 2 (two) times daily.    dapagliflozin propanediol (FARXIGA) 10 MG TABS tablet Take 1 tablet (10 mg total) by mouth daily before breakfast. (Patient not taking: Reported on 08/18/2021) 09/07/2021: 09/07/21: patient reports not taking; he insists this has not been prescribed and tells me it has not been delivered; [facility] team made aware   Dulaglutide (TRULICITY) 1.5 IR/6.7EL SOPN Inject 1.5 mg into the skin once a week. (Patient not taking: Reported on 08/18/2021)    glipiZIDE (GLUCOTROL) 10 MG tablet Take 1 tablet (10 mg total) by mouth daily before breakfast. Takes 1 more tablet if blood sugar is high (Patient taking differently: Take 20 mg by mouth 2 (two) times daily with a meal. Takes 1 more tablet if blood sugar is high)    glucose blood (ACCU-CHEK GUIDE) test strip Use as instructed 12/22/2020: use   metFORMIN (GLUCOPHAGE) 1000 MG tablet TAKE ONE TABLET BY MOUTH TWICE A DAY WITH A MEAL    methocarbamol (ROBAXIN) 500 MG tablet Take 1 tablet (500 mg total) by mouth 2 (two) times daily. (Patient taking differently: Take 500 mg by mouth 2 (two) times daily as needed.)  ondansetron (ZOFRAN ODT) 4 MG disintegrating tablet 54m ODT q4 hours prn nausea/vomit    pantoprazole (PROTONIX) 40 MG tablet Take 1 tablet (40 mg total) by mouth daily.    Ubrogepant (UBRELVY) 100 MG TABS Take 1 tablet by mouth daily.    Facility-Administered Encounter Medications as of 09/07/2021   Medication   morphine 2 MG/ML injection   Patient Active Problem List   Diagnosis Date Noted   Sebaceous cyst 08/10/2021   Neck pain on left side 05/26/2021   Intractable persistent migraine aura without cerebral infarction and with status migrainosus 05/26/2021   Hypertension associated with diabetes ([facility] 09/21/2020   Other dysphagia    Hyperglycemia 09/13/2020   Uncontrolled type 2 diabetes mellitus with hyperglycemia ([facility] 09/13/2020   CAD (coronary artery disease) 01/23/2020   Hyperlipidemia 11/02/2019   History of MI (myocardial infarction) 11/02/2019   Nonspecific chest pain 11/01/2019   Cigarette smoker 08/15/2018   COPD ? GOLD III/ active smoker 08/14/2018   History of diet-controlled diabetes 06/30/2018   COPD (chronic obstructive pulmonary disease) ([facility] 10/14/2014   Dyslipidemia associated with type 2 diabetes mellitus ([facility] 10/14/2014   Conditions to be addressed/monitored:  COPD, and DMII  Care Plan : RN Care Manager Plan of Care  Updates made by TKnox Royalty RN since 09/07/2021 12:00 AM     Problem: Chronic Disease Management Needs   Priority: High     Long-Range Goal: Ongoing adherence/ progression of established plan of care for long term chronic disease management   Start Date: 06/30/2021  Expected End Date: 06/30/2022  Priority: High  Note:   Current Barriers:  Chronic Disease Management support and education needs related to COPD and DMII Continues to smoke "1-2" cigarettes per day" Financial Constraints: reports difficulty affording food, medications, paying for utilities: CHovenreferral placed: confirmed 02/24/21 that CMicron Technologycare guide has spoken with patient; 06/30/21- confirmed CCM Pharmacy team active/ involved in patient's ongoing chronic disease care needs Language barrier with limited health literacy: requires interpreter services and frequent repetition/ reinforcement-- 02/24/21: confirmed with patient that his  daughter is no longer working: reports okay to contact English-speaking daughter Duane Cuevas, on CGardneras needed; he also tells me on 09/07/21 that his daughter reviews MYCHART regularly and is bi-lingual for Spanish/ English: he confirms no need to translate AVS into Spanish language: daughter reviews AVS with patient and understands how to speak/ read English Unable to independently verbalize appropriate diet and self-health management strategies in setting of DM/ COPD: requires ongoing reinforcement/ support  RNCM Clinical Goal(s):  Patient will demonstrate improved health management independence as evidenced by adherence to established plan of care for self-health management of DMII/ COPD        through collaboration with RN Care manager, provider, and care team.   Interventions: 1:1 collaboration with primary care provider regarding development and update of comprehensive plan of care as evidenced by provider attestation and co-signature Inter-disciplinary care team collaboration (see longitudinal plan of care) Evaluation of current treatment plan related to  self management and patient's adherence to plan as established by provider 09/07/21: Patient follow up outreach completed using interpreter services Discussed current clinical condition: reports "doing better;" post COVID in late December and ED visit 08/06/21 SDOH updated: no new concerns or unmet needs identified Pain assessment updated: today, he denies acute/ chronic pain; states migraines "are much better" after being put on medication (Roselyn Meier for same Fall assessment updated: he denies new/ recent falls; does not use assistive  devices; continues to report "feels steady" on feet with ambulation; positive reinforcement provided with encouragement to continue efforts; previously provided education around fall risks/ prevention reinforced Confirms he attended recent dermatology provider appointment for cyst: states "they gave me  antibiotics and I am almost done taking them;" he endorses general improvement in status of cyst: "it is less sore" and tells me that he believes "it is smaller and getting better;" I encouraged him to follow up with dermatology provider should he experience ongoing issues/ concerns about cyst- he verbalizes understanding/ agreement with same Reviewed recent PCP office visit 08/10/21 Confirms he is taking increased doses of glipizide and Trulicity-- however, he is non-specific about the exact dosing of both; initially states taking Trulicity once/ day, and then upon questioning, states taking "once a week on Thursday," and confirms now taking glipizide BID- however, he is unable to confirm dose of glipizide-- states "they just told me to start taking it twice a day" Tells me is not taking [place]-- states "they never prescribed this;" reviewed 08/10/21 PCP office note and instructions with patient several times, however, he states throughout call that Wilder Glade was "never prescribed;" and "was not delivered:" patient is not near his medications for confirmation of dosing/ presence of Farxiga-- medication list in EHR updated per patient report; will update CCM Pharmacist accordingly-- made patient aware that CCM Pharmacist will contact him by phone on September 16, 2021 and encouraged patient to accept phone call/ engage with Pharmacist Other than above concern noted by RN CM, patient denies medication concerns and endorses adherence to medications Confirms taking maintenance inhaler Breztri BID, and reports using albuterol rescue inhaler "about twice a day"  COPD: (Status: 09/07/21: Goal on Track (progressing): YES.) Long Term Goal  Advised patient to track and manage COPD triggers Provided instruction about proper use of medications used for management of COPD including inhalers Advised patient to self assesses COPD action plan zone and make appointment with provider if in the yellow zone for 48 hours without  improvement Provided education about and advised patient to utilize infection prevention strategies to reduce risk of respiratory infection Confirmed patient continues using inhalers: he is engaged with CCM Pharmacy team; denies needs/ questions about inhalers- states "they really help;" reports he believes he is "breathing just fine" Confirmed patient continues to report using maintenance inhaler BID and rescue inhaler 1-2 times per day: he verbalizes a better understanding today around difference between maintenance inhaler and rescue inhaler than he has in the past Confirmed patient continues smoking 1-2 cigarettes per day per self-report  Diabetes:  (Status: 09/07/21: Goal on Track (progressing): YES.) Long Term Goal   Lab Results  Component Value Date   HGBA1C 8.4 (A) 08/10/2021  Reviewed prescribed diet with patient heart healthy, low salt, carbohydrate modified, low sugar; Discussed plans with patient for ongoing care management follow up and provided patient with direct contact information for care management team;      Review of patient status, including review of consultants reports, relevant laboratory and other test results, and medications completed;       Confirmed patient continues monitoring blood sugars at home, "usually 2 times a day;" fasting and "before bed": reports consistent recent fasting values between 105-135; post-prandial values between 94-120 Confirmed no recent low blood sugar values at home Confirmed no signs/ symptoms low blood sugar at home: reinforced previously provided education around low blood sugar signs/ symptoms and corresponding action plan Encouraged patient to consider ongoing efforts to make small changes to dietary  habits/ routines: he continues to report that due to his cultural preferences/ habits that he does not follow any strict diet; today, portion control was again stressed  Patient Goals/Self-Care Activities: As evidenced by review of EHR,  collaboration with care team, and patient reporting during CCM RN CM outreach,  Patient Hall will: Take medications as prescribed Attend all scheduled provider appointments Call pharmacy for medication refills Call provider office for new concerns or questions Continue to check fasting (first thing in the morning, before eating) blood sugars at home: the blood sugars we reviewed today are all in good range Try to begin to follow heart healthy, low salt, low cholesterol, carbohydrate-modified, low sugar diet: I have attached information about this diet Great job taking effort to prevent falls-- Continue to take effort to prevent falls  If your cyst does not continue to improve, please schedule another office visit with your dermatologist    Plan: Telephone follow up appointment with care management team member scheduled for: Wednesday, October 13, 2021 at 9:45 am The patient has been provided with contact information for the care management team and has been advised to call with any health related questions or concerns  [name], RN, BSN, [place] [phone]: direct office

## 2021-09-07 NOTE — Patient Instructions (Signed)
Visit [place], thank you for taking time to talk with me today. Please don't hesitate to contact me if I can be of assistance to you before our next scheduled telephone appointment  Below are the goals we discussed today:  Patient Self-Care Activities: Patient General will: Take medications as prescribed Attend all scheduled provider appointments Call pharmacy for medication refills Call provider office for new concerns or questions Continue to check fasting (first thing in the morning, before eating) blood sugars at home: the blood sugars we reviewed today are all in good range Try to begin to follow heart healthy, low salt, low cholesterol, carbohydrate-modified, low sugar diet: I have attached information about this diet Great job taking effort to prevent falls-- Continue to take effort to prevent falls  If your cyst does not continue to improve, please schedule another office visit with your dermatologist  Our next scheduled telephone follow up visit/ appointment is scheduled on:  Wednesday, October 13, 2021 at 9:45 am- This is a PHONE [place] appointment  If you need to cancel or re-schedule our visit, please call [phone] and our care guide team will be happy to assist you.   I look forward to hearing about your progress.   [name], RN, BSN, [place] [phone]: direct office  If you are experiencing a Mental Health or [place] or need someone to talk to, please  call the Suicide and Crisis Lifeline: 988 call the [place] National Suicide Prevention Lifeline: 919-124-5374 or TTY: (778)071-3038 TTY 507-223-4175) to talk to a trained counselor call 1-800-273-TALK (toll free, 24 hour hotline) go to [facility] [address] [place] [phone]) call 911   Patient verbalizes understanding of instructions and care plan provided today and agrees to  view in [place]. Active MyChart status confirmed with patient.  Patient confirms today that his daughter reads the information in My-Chart and understands how to read English: he denies need to translate AVS and tells me that his daughter will review the AVS with him in Spanish (daughter is bi-lingual for [place] and [place])  Living with COPD Being diagnosed with chronic obstructive pulmonary disease (COPD) changes your life physically and emotionally. Having COPD can affect your ability to work and do things you enjoy. COPD is not the same for everyone, and it may change over time. Your health care providers can help you come up with the COPD management plan that works best for you. How to manage lifestyle changes Treatment plan Work closely with your health care providers. Follow your COPD management plan. This plan includes: Instructions about activities, exercises, diet, medicines, what to do when COPD flares up, and when to call your health care provider. A pulmonary rehabilitation program. In pulmonary rehab, you will learn about COPD, do exercises for fitness and breathing, and get support from health care providers and other people who have COPD. Managing emotions and stress Living with a chronic disease means you may also struggle with stressful emotions, such as sadness, fear, and worry. Here are some ways to manage these emotions: Talk to someone about your fear, anxiety, depression, or stress. Learn strategies to avoid or reduce stress and ask for help if you are struggling with depression or anxiety. Consider joining a COPD support group, online or in person.  Adjusting to changes COPD may limit the things you can do, but you can make certain changes to help you cope with the  diagnosis. Ask for help when you need it. Getting support from friends, family, and your health care team is an important part of managing the condition. Try to get regular exercise as prescribed by a health  care provider or pulmonary rehab team. Exercising can help COPD, even if you are a bit short of breath. Take steps to prevent infection and protect your lungs: Wash your hands often and avoid being in crowds. Stay away from friends and family members who are sick. Check your local air quality each day, and stay out of areas where air pollution is likely. How to recognize changes in your condition Recognizing changes in your COPD COPD is a progressive disease. It is important to let the health care team know if your COPD is getting worse. Your treatment plan may need to change. Watch for: Increased shortness of breath, wheezing, cough, or fatigue. Loss of ability to exercise or perform daily activities, like climbing stairs. More frequent symptom flares. Signs of depression or anxiety. Recognizing stress It is normal to have additional stress when you have COPD. However, prolonged stress and anxiety can make COPD worse and lead to depression. Recognize the warning signs, which include: Feeling sad or worried more often or most of the time. Having less energy and losing interest in pleasurable activities. Changes in your appetite or sleeping patterns. Being easily angered or irritated. Having unexplained aches and pains, digestive problems, or headaches. Follow these instructions at home: Eating and drinking  Eat foods that are high in fiber, such as fresh fruits and vegetables, whole grains, and beans. Limit foods that are high in fat and processed sugars, such as fried or sweet foods. Follow a balanced diet and maintain a healthy weight. Being overweight or underweight can make COPD worse. You may work with a Microbiologist as part of your pulmonary rehab program. Drink enough fluid to keep your urine pale yellow. If you drink alcohol: Limit how much you have to: 0-1 drink a day for women who are not pregnant. 0-2 drinks a day for men. Know how much alcohol is in your drink. In the [place] one  drink equals one 12 oz bottle of beer (355 mL), one 5 oz glass of wine (148 mL), or one 1 oz glass of hard liquor (44 mL). Lifestyle If you smoke, the most important thing that you can do is to stop smoking. Continuing to smoke will cause the disease to progress faster. Do not use any products that contain nicotine or tobacco. These products include cigarettes, chewing tobacco, and vaping devices, such as e-cigarettes. If you need help quitting, ask your health care provider. Avoid exposure to things that irritate your lungs, such as smoke, chemicals, and fumes. Activity Balance exercise and rest. Take short walks every 1-2 hours. This is important to improve blood flow and breathing. Ask for help if you feel weak or unsteady. Do exercises that include controlled breathing with body movement, such as tai chi. General instructions Take over-the-counter and prescription medicines only as told by your health care provider. Take vitamin and protein supplements as told by your health care provider or dietitian. Practice good oral hygiene and see your dental care provider regularly. An oral infection can also spread to your lungs. Make sure you receive all the vaccines that your health care provider recommends. Keep all follow-up visits. This is important. Contact a health care provider if you: Are struggling to manage your COPD. Have emotional stress that interferes with your ability to cope with  COPD. Get help right away if you: Have thoughts of suicide, death, or hurting yourself or others. If you ever feel like you may hurt yourself or others, or have thoughts about taking your own life, get help right away. Go to your nearest emergency department or: Call your local emergency services (911 in the [place] Call a suicide crisis helpline, such as the [place] at (240) 701-9905 or 988 in the [place]. This is open 24 hours a day in the [place] Text the Crisis Text Line at (403)240-1732  (in the [place].). Summary Being diagnosed with chronic obstructive pulmonary disease (COPD) changes your life physically and emotionally. Work with your health care providers and follow your COPD management plan. A pulmonary rehabilitation program is an important part of COPD management. Prolonged stress, anxiety, and depression can make COPD worse. Let your health care provider know if emotional stress interferes with your ability to cope with and manage COPD. This information is not intended to replace advice given to you by your health care provider. Make sure you discuss any questions you have with your health care provider. Document Revised: 01/13/2021 Document Reviewed: 07/08/2020 Elsevier Patient Education  2022 [employer].

## 2021-09-09 ENCOUNTER — Telehealth: Payer: Self-pay | Admitting: Emergency Medicine

## 2021-09-09 NOTE — Telephone Encounter (Signed)
Kim from [facility] calling in ? ?Wanted to make [place] aware that the med Budeson-Glycopyrrol-Formoterol (BREZTRI AEROSPHERE) 160-9-4.8 MCG/ACT AERO  ?will no longer be covered under their drug formulary & if patient is needing inhaler the alternative to use is Symbicort. ?

## 2021-09-09 NOTE — Telephone Encounter (Signed)
Okay.  Thanks.

## 2021-09-15 ENCOUNTER — Telehealth: Payer: Medicare HMO

## 2021-09-16 ENCOUNTER — Ambulatory Visit: Payer: Medicare HMO

## 2021-09-16 NOTE — Progress Notes (Signed)
? ?Chronic Care Management ?Pharmacy Note ? ?09/16/2021 ?Name:  Duane Cuevas MRN:  [id number] DOB:  Dec 11, 1955 ? ?Summary: ?- spoke with patient via translator service ID [phone] ?-Patient reports that he has been compliant with current medications - is waiting on refill of glipizide and [facility], but has all other medications on profile - denies issues with current medications ?-Checking BG at home 3 times daily - has been averaging 110-135 denies any issues with hypoglycemia  ?-Reports he feels breathing has been controlled since switching to breztri - decreased albuterol use since starting  ? ?Recommendations/Changes made from today's visit: ?-Recommending no changes at this time, BG stable, breathing stable ?-Patient plans to transfer [facility] to [facility] as it was easier and quicker for him to obtain his medications at that pharmacy - will reach out with any issues  ? ?Subjective: ?Duane Cuevas is an 66 y.o. year old male who is a primary patient of Sagardia, Ines Bloomer, MD.  The CCM team was consulted for assistance with disease management and care coordination needs.   ? ?Engaged with patient by telephone for follow up visit in response to provider referral for pharmacy case management and/or care coordination services.  ? ?Consent to Services:  ?The patient was given the following information about Chronic Care Management services today, agreed to services, and gave verbal consent: 1. CCM service includes personalized support from designated clinical staff supervised by the primary care provider, including individualized plan of care and coordination with other care providers 2. 24/7 contact phone numbers for assistance for urgent and routine care needs. 3. Service will only be billed when office clinical staff spend 20 minutes or more in a month to coordinate care. 4. Only one practitioner may furnish and bill the service in a calendar month. 5.The patient may stop  CCM services at any time (effective at the end of the month) by phone call to the office staff. 6. The patient will be responsible for cost sharing (co-pay) of up to 20% of the service fee (after annual deductible is met). Patient agreed to services and consent obtained. ? ?Patient Care Team: ?[name], MD as PCP - General (Internal Medicine) ?Knox Royalty, RN as Case Manager ?Tomasa Blase, [facility] as Pharmacist (Pharmacist) ? ?Recent Office Visits: ?08/10/2021 - Dr. Mitchel Honour - hospital follow up - increase trulicity to 5.1OA weekly - start farxiga 41m daily - tramadol 521mfor pain from sebaceous cyst on neck - referred to dermatology  ?  ?Recent consult visits:  ?[facility]  ?  ?Hospital visits:  ?08/06/2021 - Ed visit due to left sided chest pain - cardiac workup negative - rx'd prednisone 2065maily and vicodin on discharge - pain is left sided neck pain from sebaceous cyst on neck ? ?Objective: ? ?Lab Results  ?Component Value Date  ? CREATININE 0.88 08/06/2021  ? BUN 20 08/06/2021  ? GFRNONAA >60 08/06/2021  ? GFRAA 101 06/22/2020  ? NA 134 (L) 08/06/2021  ? K 4.0 08/06/2021  ? CALCIUM 9.4 08/06/2021  ? CO2 22 08/06/2021  ? GLUCOSE 264 (H) 08/06/2021  ? ? ?Lab Results  ?Component Value Date/Time  ? HGBA1C 8.4 (A) 08/10/2021 03:48 PM  ? HGBA1C 7.6 (A) 03/24/2021 09:59 AM  ? HGBA1C 8.9 (H) 04/28/2020 01:00 AM  ? HGBA1C 6.8 (H) 11/02/2019 05:58 AM  ?  ?Last diabetic Eye exam:  ?Lab Results  ?Component Value Date/Time  ? HMDIABEYEEXA No Retinopathy  11/09/2020 02:50 PM  ?  ?Last diabetic Foot exam:  ?No results found for: HMDIABFOOTEX  ? ?Lab Results  ?Component Value Date  ? CHOL 172 06/22/2020  ? HDL 48 06/22/2020  ? LDLCALC 102 (H) 06/22/2020  ? TRIG 126 06/22/2020  ? CHOLHDL 3.6 06/22/2020  ? ? ?Hepatic Function Latest Ref Rng & Units 10/21/2020 09/14/2020 09/10/2020  ?Total Protein 6.5 - 8.1 g/dL 6.9 6.7 7.7  ?Albumin 3.5 - 5.0 g/dL 3.6 3.9 4.3  ?AST 15 -  41 U/L _0 ?ALT 0 - 44 U/L 22 44 39  ?Alk Phosphatase 38 - 126 U/L 66 56 70  ?Total Bilirubin 0.3 - 1.2 mg/dL 0.9 0.6 0.4  ?Bilirubin, Direct 0.0 - 0.2 mg/dL - <0.1 <0.1  ? ? ?No results found for: TSH, FREET4 ? ?CBC Latest Ref Rng & Units 08/06/2021 04/14/2021 03/25/2021  ?WBC 4.0 - 10.5 K/uL 9.3 8.5 7.3  ?Hemoglobin 13.0 - 17.0 g/dL 15.7 15.0 16.0  ?Hematocrit 39.0 - 52.0 % 47.0 44.5 47.8  ?Platelets 150 - 400 K/uL 230 255 248  ? ? ?No results found for: VD25OH ? ?Clinical ASCVD: Yes  ?The ASCVD Risk score (Arnett DK, et al., 2019) failed to calculate for the following reasons: ?  The patient has a prior MI or stroke diagnosis   ? ?Depression screen [facility] 2/9 08/09/2021 03/24/2021 01/14/2021  ?Decreased Interest 0 0 0  ?Down, Depressed, Hopeless 0 0 0  ?PHQ - 2 Score 0 0 0  ? ?Social History  ? ?Tobacco Use  ?Smoking Status Every Day  ? Packs/day: 0.15  ? Years: 47.00  ? Pack years: 7.05  ? Types: Cigarettes  ?Smokeless Tobacco Never  ? ?BP Readings from Last 3 Encounters:  ?08/10/21 130/78  ?08/09/21 130/80  ?08/06/21 110/80  ? ?Pulse Readings from Last 3 Encounters:  ?08/10/21 99  ?08/09/21 (!) 109  ?08/06/21 80  ? ?Wt Readings from Last 3 Encounters:  ?08/10/21 168 lb (76.2 kg)  ?08/09/21 167 lb 6.4 oz (75.9 kg)  ?08/06/21 175 lb (79.4 kg)  ? ?BMI Readings from Last 3 Encounters:  ?08/10/21 25.54 kg/m?  ?08/09/21 25.45 kg/m?  ?08/06/21 26.61 kg/m?  ? ? ?Assessment/Interventions: Review of patient past medical history, allergies, medications, health status, including review of consultants reports, laboratory and other test data, was performed as part of comprehensive evaluation and provision of chronic care management services.  ? ?SDOH:  (Social Determinants of Health) assessments and interventions performed: Yes ? ?SDOH Screenings  ? ?Alcohol Screen: Low Risk   ? Last Alcohol Screening Score (AUDIT): 0  ?Depression (PHQ2-9): Low Risk   ? PHQ-2 Score: 0  ?Financial Resource Strain: High Risk  ? Difficulty of  Paying Living Expenses: Hard  ?Food Insecurity: No Food Insecurity  ? Worried About Charity fundraiser in the Last Year: Never true  ? Ran Out of Food in the Last Year: Never true  ?Housing: Low Risk   ? Last Housing Risk Score: 0  ?Physical Activity: Sufficiently Active  ? Days of Exercise per Week: 5 days  ? Minutes of Exercise per Session: 30 min  ?Social Connections: Moderately Isolated  ? Frequency of Communication with Friends and Family: More than three times a week  ? Frequency of Social Gatherings with Friends and Family: More than three times a week  ? Attends Religious Services: Never  ? Active Member of Clubs or Organizations: No  ? Attends Archivist Meetings: Never  ? Marital Status: Married  ?  Stress: No Stress Concern Present  ? Feeling of Stress : Not at all  ?Tobacco Use: High Risk  ? Smoking Tobacco Use: Every Day  ? Smokeless Tobacco Use: Never  ? Passive Exposure: Not on file  ?Transportation Needs: No Transportation Needs  ? Lack of Transportation (Medical): No  ? Lack of Transportation (Non-Medical): No  ? ? ?CCM Care Plan ? ?No Known Allergies ? ?Medications Reviewed Today   ? ? Reviewed by Knox Royalty, RN (Registered Nurse) on 09/07/21 at [phone]  Med List Status: <None>  ? ?Medication Order Taking? Sig Documenting Provider Last Dose Status Informant  ?albuterol (PROVENTIL) (2.5 MG/3ML) 0.083% nebulizer solution [id number]  USE 1 VIAL VIA NEBULIZER EVERY 6 HOURS AS NEEDED FOR WHEEZING OR SHORTNESS OF BREATH Sagardia, Ines Bloomer, MD  Active   ?albuterol (VENTOLIN HFA) 108 (90 Base) MCG/ACT inhaler [id number]  Inhale 2 puffs into the lungs every 6 (six) hours as needed for wheezing or shortness of breath. [name], MD  Active   ?amLODipine (NORVASC) 5 MG tablet [id number]  Take 1 tablet (5 mg total) by mouth daily. [name], MD  Active   ?atorvastatin (LIPITOR) 20 MG tablet [id number]  TAKE ONE TABLET BY MOUTH ONE TIME DAILY [name], MD  Active    ?blood glucose meter kit and supplies [id number]  Dispense based on patient and insurance preference. Use up to four times daily as directed. (FOR ICD-10 E10.9, E11.9). [name], MD  Active [place]

## 2021-09-16 NOTE — Patient Instructions (Signed)
Visit Information ? ?Following are the goals we discussed today:  ? ?Set My Target A1c  ? ?Timeframe:  Long-Range Goal ?Priority:  High ?Start Date:  04/09/2021                           ?Expected End Date:  07/10/2022                   ? ?Follow Up Date 01/16/2022 ?  ?- set target A1C <7.0% - attain a more stable blood sugar throughout the day   ?  ?Why is this important?   ?Your target A1C is decided together by you and your doctor.  ?It is based on several things like your age and other health issues. ? ?Track and Manage My Symptoms / Triggers - COPD  ? ?Timeframe:  Long-Range Goal ?Priority:  High ?Start Date:  04/09/2021                           ?Expected End Date:  07/10/2022                    ? ?Follow Up Date 01/16/2022 ?  ?- avoid second hand smoke ?- eliminate smoking in my home ?- identify and avoid work-related triggers ?- identify and remove indoor air pollutants ?- limit outdoor activity during cold weather  ?  ?Why is this important?   ?Triggers are activities or things, like tobacco smoke or cold weather, that make your COPD (chronic obstructive pulmonary disease) flare-up.  ?Knowing these triggers helps you plan how to stay away from them.  ?When you cannot remove them, you can learn how to manage them.  ? ?Plan: Telephone follow up appointment with care management team member scheduled for:  4 months ?The patient has been provided with contact information for the care management team and has been advised to call with any health related questions or concerns.  ? ?Tomasa Blase, PharmD ?Clinical Pharmacist, [place]  ? ?Please call the care guide team at [phone] if you need to cancel or reschedule your appointment.  ? ?Patient verbalizes understanding of instructions and care plan provided today and agrees to view in [place]. Active MyChart status confirmed with patient.   ? ?

## 2021-09-24 DIAGNOSIS — I10 Essential (primary) hypertension: Secondary | ICD-10-CM | POA: Diagnosis not present

## 2021-09-24 DIAGNOSIS — K219 Gastro-esophageal reflux disease without esophagitis: Secondary | ICD-10-CM | POA: Diagnosis not present

## 2021-09-24 DIAGNOSIS — R69 Illness, unspecified: Secondary | ICD-10-CM | POA: Diagnosis not present

## 2021-09-24 DIAGNOSIS — G8929 Other chronic pain: Secondary | ICD-10-CM | POA: Diagnosis not present

## 2021-09-24 DIAGNOSIS — J439 Emphysema, unspecified: Secondary | ICD-10-CM | POA: Diagnosis not present

## 2021-09-24 DIAGNOSIS — I700 Atherosclerosis of aorta: Secondary | ICD-10-CM | POA: Diagnosis not present

## 2021-09-24 DIAGNOSIS — Z7985 Long-term (current) use of injectable non-insulin antidiabetic drugs: Secondary | ICD-10-CM | POA: Diagnosis not present

## 2021-09-24 DIAGNOSIS — E1151 Type 2 diabetes mellitus with diabetic peripheral angiopathy without gangrene: Secondary | ICD-10-CM | POA: Diagnosis not present

## 2021-09-24 DIAGNOSIS — I252 Old myocardial infarction: Secondary | ICD-10-CM | POA: Diagnosis not present

## 2021-09-24 DIAGNOSIS — E785 Hyperlipidemia, unspecified: Secondary | ICD-10-CM | POA: Diagnosis not present

## 2021-09-24 DIAGNOSIS — I25119 Atherosclerotic heart disease of native coronary artery with unspecified angina pectoris: Secondary | ICD-10-CM | POA: Diagnosis not present

## 2021-09-24 DIAGNOSIS — Z7984 Long term (current) use of oral hypoglycemic drugs: Secondary | ICD-10-CM | POA: Diagnosis not present

## 2021-10-01 DIAGNOSIS — E1165 Type 2 diabetes mellitus with hyperglycemia: Secondary | ICD-10-CM

## 2021-10-01 DIAGNOSIS — E1159 Type 2 diabetes mellitus with other circulatory complications: Secondary | ICD-10-CM

## 2021-10-01 DIAGNOSIS — J449 Chronic obstructive pulmonary disease, unspecified: Secondary | ICD-10-CM | POA: Diagnosis not present

## 2021-10-01 DIAGNOSIS — I152 Hypertension secondary to endocrine disorders: Secondary | ICD-10-CM | POA: Diagnosis not present

## 2021-10-13 ENCOUNTER — Other Ambulatory Visit: Payer: Self-pay | Admitting: Emergency Medicine

## 2021-10-13 ENCOUNTER — Ambulatory Visit (INDEPENDENT_AMBULATORY_CARE_PROVIDER_SITE_OTHER): Payer: Medicare HMO | Admitting: *Deleted

## 2021-10-13 DIAGNOSIS — E1165 Type 2 diabetes mellitus with hyperglycemia: Secondary | ICD-10-CM

## 2021-10-13 DIAGNOSIS — J449 Chronic obstructive pulmonary disease, unspecified: Secondary | ICD-10-CM

## 2021-10-13 DIAGNOSIS — I10 Essential (primary) hypertension: Secondary | ICD-10-CM

## 2021-10-13 NOTE — Chronic Care Management (AMB) (Signed)
?Chronic Care Management  ? ?CCM RN Visit Note ? ?10/13/2021 ?Name: Duane Cuevas MRN: [id number] DOB: May 21, 1956 ? ?Subjective: ?Duane Cuevas is a 66 y.o. year old male who is a primary care patient of Duane Cuevas, Duane Bloomer, MD. The care management team was consulted for assistance with disease management and care coordination needs.   ? ?Engaged with patient by telephone for follow up visit in response to provider referral for case management and/or care coordination services. [place] facilitated call today; "Duane Cuevas" ID [phone] ? ?Consent to Services:  ?The patient was given information about Chronic Care Management services, agreed to services, and gave verbal consent prior to initiation of services.  Please see initial visit note for detailed documentation.  ?Patient agreed to services and verbal consent obtained.  ? ?Assessment: Review of patient past medical history, allergies, medications, health status, including review of consultants reports, laboratory and other test data, was performed as part of comprehensive evaluation and provision of chronic care management services.  ? ?SDOH (Social Determinants of Health) assessments and interventions performed:  ?SDOH Interventions   ? ?Flowsheet Row Most Recent Value  ?SDOH Interventions   ?Food Insecurity Interventions Intervention Not Indicated  [continues to deny food insecurity,  reports gets food stamps and an additional $500.00 for food through his insurance program]  ?Housing Interventions Intervention Not Indicated  [continues to reside with his spouse]  ?Transportation Interventions Intervention Not Indicated  [confirmed daughter continues to provide transportation]  ? ?  ?CCM Care Plan ? ?No Known Allergies ? ?Outpatient Encounter Medications as of 10/13/2021  ?Medication Sig Note  ? albuterol (PROVENTIL) (2.5 MG/3ML) 0.083% nebulizer solution USE 1 VIAL VIA NEBULIZER EVERY 6 HOURS AS NEEDED FOR WHEEZING OR SHORTNESS OF BREATH    ? albuterol (VENTOLIN HFA) 108 (90 Base) MCG/ACT inhaler Inhale 2 puffs into the lungs every 6 (six) hours as needed for wheezing or shortness of breath.   ? atorvastatin (LIPITOR) 20 MG tablet TAKE ONE TABLET BY MOUTH ONE TIME DAILY   ? blood glucose meter kit and supplies KIT Dispense based on patient and insurance preference. Use up to four times daily as directed. 12/22/2020: use  ? blood glucose meter kit and supplies Dispense based on patient and insurance preference. Use up to four times daily as directed. (FOR ICD-10 E10.9, E11.9). 12/22/2020: use  ? Budeson-Glycopyrrol-Formoterol (BREZTRI AEROSPHERE) 160-9-4.8 MCG/ACT AERO Inhale 2 puffs into the lungs 2 (two) times daily.   ? dapagliflozin propanediol (FARXIGA) 10 MG TABS tablet Take 1 tablet (10 mg total) by mouth daily before breakfast.   ? Dulaglutide (TRULICITY) 1.5 OQ/9.4TM SOPN Inject 1.5 mg into the skin once a week.   ? glipiZIDE (GLUCOTROL) 10 MG tablet Take 1 tablet (10 mg total) by mouth daily before breakfast. Takes 1 more tablet if blood sugar is high (Patient taking differently: Take 10 mg by mouth 2 (two) times daily before a meal. Takes 1 more tablet if blood sugar is high)   ? glucose blood (ACCU-CHEK GUIDE) test strip Use as instructed 12/22/2020: use  ? metFORMIN (GLUCOPHAGE) 1000 MG tablet TAKE ONE TABLET BY MOUTH TWICE A DAY WITH A MEAL   ? methocarbamol (ROBAXIN) 500 MG tablet Take 1 tablet (500 mg total) by mouth 2 (two) times daily. (Patient not taking: Reported on 09/16/2021)   ? ondansetron (ZOFRAN ODT) 4 MG disintegrating tablet 4mg  ODT q4 hours prn nausea/vomit (Patient not taking: Reported on 09/16/2021)   ? pantoprazole (PROTONIX) 40 MG tablet Take 1 tablet (40 mg  total) by mouth daily.   ? Ubrogepant (UBRELVY) 100 MG TABS Take 1 tablet by mouth daily.   ? [DISCONTINUED] amLODipine (NORVASC) 5 MG tablet Take 1 tablet (5 mg total) by mouth daily.   ? ?Facility-Administered Encounter Medications as of 10/13/2021  ?Medication  ?  morphine 2 MG/ML injection  ? ?Patient Active Problem List  ? Diagnosis Date Noted  ? Sebaceous cyst 08/10/2021  ? Neck pain on left side 05/26/2021  ? Intractable persistent migraine aura without cerebral infarction and with status migrainosus 05/26/2021  ? Hypertension associated with diabetes ([place]) 09/21/2020  ? Other dysphagia   ? Hyperglycemia 09/13/2020  ? Uncontrolled type 2 diabetes mellitus with hyperglycemia ([place]) 09/13/2020  ? CAD (coronary artery disease) 01/23/2020  ? Hyperlipidemia 11/02/2019  ? History of MI (myocardial infarction) 11/02/2019  ? Nonspecific chest pain 11/01/2019  ? Cigarette smoker 08/15/2018  ? COPD ? GOLD III/ active smoker 08/14/2018  ? History of diet-controlled diabetes 06/30/2018  ? COPD (chronic obstructive pulmonary disease) ([place]) 10/14/2014  ? Dyslipidemia associated with type 2 diabetes mellitus ([place]) 10/14/2014  ? ?Conditions to be addressed/monitored:  COPD and DMII ? ?Care Plan : RN Care Manager Plan of Care  ?Updates made by Knox Royalty, RN since 10/13/2021 12:00 AM  ?  ? ?Problem: Chronic Disease Management Needs   ?Priority: High  ?  ? ?Long-Range Goal: Ongoing adherence/ progression of established plan of care for long term chronic disease management   ?Start Date: 06/30/2021  ?Expected End Date: 06/30/2022  ?Priority: High  ?Note:   ?Current Barriers:  ?Chronic Disease Management support and education needs related to COPD and DMII ?Continues to smoke "1-2" cigarettes per day" ?Financial Constraints: reports difficulty affording food, medications, paying for utilities: [place] referral placed: confirmed 02/24/21 that [employer] care guide has spoken with patient; 06/30/21- confirmed CCM Pharmacy team active/ involved in patient's ongoing chronic disease care needs ?10/13/21: confirmed no ongoing SDOH needs; CCM Pharmacy team remains active ?Language barrier with limited health literacy: requires interpreter services and frequent  repetition/ reinforcement-- 02/24/21: confirmed with patient that his daughter is no longer working: reports okay to contact English-speaking daughter [name], on [place] as needed; he also tells me on 09/07/21 that his daughter reviews MYCHART regularly and is bi-lingual for Spanish/ English: he confirms no need to translate AVS into Spanish language: daughter reviews AVS with patient and understands how to speak/ read English ?10/13/21: patient requests translation of AVS into Spanish; requests mailed copy of AVS ?Unable to independently verbalize appropriate diet and self-health management strategies in setting of DM/ COPD: requires ongoing reinforcement/ support ? ?RNCM Clinical Goal(s):  ?Patient will demonstrate improved health management independence as evidenced by adherence to established plan of care for self-health management of DMII/ COPD        through collaboration with RN Care manager, provider, and care team.  ? ?Interventions: ?1:1 collaboration with primary care provider regarding development and update of comprehensive plan of care as evidenced by provider attestation and co-signature ?Inter-disciplinary care team collaboration (see longitudinal plan of care) ?Evaluation of current treatment plan related to  self management and patient's adherence to plan as established by provider ?Review of patient status, including review of consultants reports, relevant laboratory and other test results, and medications completed ?SDOH updated: no new/ unmet concerns identified ?Pain assessment updated: denies pain today ?Falls assessment updated: continues to deny new/ recent falls x 12 months- not currently using assistive devices;  positive reinforcement provided  with encouragement to continue efforts at fall prevention; previously provided education around fall risks/ prevention reinforced ?Medications discussed:  reports continues to independently self-manage and denies current concerns/ issues/ questions  around medications; endorses adherence to taking all medications as prescribed ?Confirms now using symbicort inhaler, as Judithann Sauger was too expensive ?Reviewed upcoming scheduled provider appointments: 01/17/22- CCM ph

## 2021-10-13 NOTE — Patient Instructions (Signed)
Visit Information ? ?Duane Cuevas, thank you for taking time to talk with me today. Please don't hesitate to contact me if I can be of assistance to you before our next scheduled telephone appointment ? ?Below are the goals we discussed today:  ?Patient Self-Care Activities: ?Patient Duane Cuevas will: ?Take medications as prescribed ?Attend all scheduled provider appointments ?Call pharmacy for medication refills ?Call provider office for new concerns or questions ?Continue to check fasting (first thing in the morning, before eating) and night time blood sugars at home: the blood sugars we reviewed today are all in good range ?Try to follow heart healthy, low salt, low cholesterol, carbohydrate-modified, low sugar diet:  ?[place] job taking effort to prevent falls-- Continue to take effort to prevent falls  ? ?Our next scheduled telephone follow up visit/ appointment is scheduled on:   Wednesday, January 26, 2022 at 9:45 am- This is a PHONE [place] appointment ? ?If you need to cancel or re-schedule our visit, please call [phone] and our care guide team will be happy to assist you. ?  ?I look forward to hearing about your progress. ?  ?Duane Rack, RN, BSN, CCRN Alumnus ?[place] ?([phone]: direct office ? ?If you are experiencing a Mental Health or [place] or need someone to talk to, please  ?call the Suicide and Crisis Lifeline: 988 ?call the [place] National Suicide Prevention Lifeline: (865) 609-5409 or TTY: [phone] TTY [phone]) to talk to a trained counselor ?call 1-800-273-TALK (toll free, 24 hour hotline) ?go to [facility] [address] [place] [phone]) ?call 911  ? ?The patient verbalized understanding of instructions, educational materials, and care plan provided today and agreed to receive a mailed copy of patient instructions, educational materials, and care plan  ? ??ngel, gracias por  tomarte el tiempo de hablar conmigo hoy. No dude en ponerse en contacto conmigo si puedo ayudarle antes de nuestra pr?xima cita telef?nica programada. ? ?A continuaci?n se muestran los objetivos que discutimos hoy: ?Actividades de autocuidado del paciente: ?El paciente Duane Cuevas: ? Tome los medicamentos seg?n lo prescrito ? Asistir a todas las citas programadas con proveedores ? Llame a la farmacia para recargar medicamentos ? Llame a la oficina del proveedor si tiene nuevas inquietudes o preguntas ? Contin?e controlando el az?car en la sangre en ayunas (a primera hora de la ma?ana, antes de comer) y Duane Cuevas, building services noche en casa: los niveles de az?car en la sangre que revisamos hoy est?n todos en un buen rango ? Trate de seguir una dieta saludable para el coraz?n, baja en sal, baja en colesterol, modificada en carbohidratos y baja en az?car: ? Buen trabajo esforz?ndose para prevenir ca?das-- Contin?e esforz?ndose para prevenir ca?das ? ?Nuestra pr?xima cita/visita de seguimiento telef?nica programada est? programada para el: mi?rcoles 26 de julio de 2023 a las 9:45 a. m. Esta es una cita por [place] TELEF?NICA ? ?Si necesita cancelar o reprogramar nuestra visita, llame al [phone] y [name] equipo de gu?as de atenci?n estar? encantado de ayudarle. ? ?Espero escuchar acerca de su progreso. ? ?Duane Rack, RN, BSN, exalumna de CCRN ?[place] ?([phone]: Duane Cuevas ? ?Si est? experimentando una crisis de salud mental o de salud del comportamiento o necesita hablar con alguien, por favor ? llamar a la l?nea de vida de crisis y suicidio: 988 ? llamar a la [employer] de Prevenci?n del Suicidio de [place].: [phone] o TTY: [phone]  TTY [phone]) para hablar con un consejero capacitado ? llame al 1-800-273-TALK (l?nea directa gratuita las 24 horas) ? dir?jase a Atenci?n de [place] de salud conductual del condado de [place] [address]  [place] [phone]) ? llama al 911 ? ? El paciente verbaliz? la comprensi?n de las instrucciones, los materiales educativos y Theatre manager de atenci?n proporcionados hoy y acept? recibir por correo una copia de las instrucciones para el [place], los materiales educativos y Theatre manager de atenci?n. ? ?Diabetes mellitus y nutrici?n, en adultos ?Diabetes Mellitus and Nutrition, Adult ?Si sufre de diabetes, o diabetes mellitus, es muy importante tener h?bitos alimenticios saludables debido a que sus niveles de az?car en la sangre (glucosa) se ven afectados en gran medida por lo que come y bebe. [place] alimentos saludables en las cantidades correctas, aproximadamente a la misma hora todos los d?as, lo ayudar? a: ?Controlar su glucemia. ?Disminuir el riesgo de sufrir una enfermedad card?aca. ?Mejorar la presi?n arterial. ?Alcanzar o mantener un peso saludable. ??Qu? puede afectar mi plan de alimentaci?n? ?Todas las personas que sufren de diabetes son diferentes y cada una tiene necesidades diferentes en cuanto a un plan de alimentaci?n. El m?dico puede recomendarle que trabaje con un nutricionista para Financial trader plan para usted. Su plan de alimentaci?n puede variar seg?n factores como: ?Las calor?as que necesita. ?Los medicamentos que toma. ?Su peso. ?Sus niveles de glucemia, presi?n arterial y colesterol. ?Su nivel de [place]. ?Otras afecciones que tenga, como enfermedades card?acas o renales. ??C?mo me afectan los carbohidratos? ?Los carbohidratos, o hidratos de carbono, afectan su nivel de glucemia m?s que cualquier otro tipo de alimento. La ingesta de carbohidratos aumenta la cantidad de [employer]. ?Es importante conocer la cantidad de carbohidratos que se pueden ingerir en cada comida sin correr ning?n riesgo. Esto es Psychologist, forensic. Su nutricionista puede ayudarlo a calcular la cantidad de carbohidratos que debe ingerir en cada comida y en cada refrigerio. ??C?mo me afecta el  alcohol? ?El alcohol puede provocar una disminuci?n de la glucemia (hipoglucemia), especialmente si [place] insulina o toma determinados medicamentos por v?a oral para la diabetes. La hipoglucemia es una afecci?n potencialmente mortal. Los s?ntomas de la hipoglucemia, como somnolencia, mareos y confusi?n, son similares a los s?ntomas de haber consumido demasiado alcohol. ?No beba alcohol si: ?Su m?dico le indica no hacerlo. ?Est? embarazada, puede estar embarazada o est? tratando de quedar embarazada. ?Si bebe alcohol: ?Limite la cantidad que bebe a lo siguiente: ?De 0 a 1 medida por d?a para las mujeres. ?De 0 a 2 medidas por d?a para los hombres. ?Sepa cu?nta cantidad de alcohol hay en las bebidas que toma. En los [place], una medida equivale a una botella de cerveza de 12 oz (355 ml), un vaso de vino de 5 oz (148 ml) o un vaso de una bebida alcoh?lica de alta graduaci?n de 1? oz (44 ml). ?Mant?ngase hidratado bebiendo agua, refrescos diet?ticos o t? helado sin az?car. Tenga en cuenta que los refrescos comunes, los jugos y otras bebidas para mezclar pueden contener mucha az?car y se deben contar como carbohidratos. ?Consejos para seguir este plan ?Leer las etiquetas de los alimentos ?Comience por leer el tama?o de la porci?n en la etiqueta de Informaci?n nutricional de los alimentos envasados y las bebidas. La cantidad de calor?as, carbohidratos, grasas y otros nutrientes detallados en la etiqueta se basan en una porci?n del alimento. Muchos alimentos contienen m?s de una porci?n por envase. ?Verifique la cantidad total de gramos (g) de carbohidratos  totales en una porci?n. ?Verifique la cantidad de gramos de grasas saturadas y grasas trans en una porci?n. Escoja alimentos que no contengan estas grasas o que su contenido de estas sea [place]. ?Verifique la cantidad de miligramos (mg) de sal (sodio) en una porci?n. La mayor?a de las personas deben limitar la ingesta de sodio total a menos de 2300 mg por  d?a. ?Siempre consulte la informaci?n nutricional de los alimentos etiquetados como ?con bajo contenido de grasa? o ?sin grasa?. Estos alimentos pueden tener un mayor contenido de az?car agregada o carbohidratos refinados, y deben ev

## 2021-10-19 ENCOUNTER — Emergency Department (HOSPITAL_COMMUNITY): Payer: Medicare HMO

## 2021-10-19 ENCOUNTER — Other Ambulatory Visit: Payer: Self-pay

## 2021-10-19 ENCOUNTER — Encounter (HOSPITAL_COMMUNITY): Payer: Self-pay

## 2021-10-19 ENCOUNTER — Emergency Department (HOSPITAL_COMMUNITY)
Admission: EM | Admit: 2021-10-19 | Discharge: 2021-10-19 | Disposition: A | Discharge status: Home / Self Care | Payer: Medicare HMO | Attending: Emergency Medicine | Admitting: Emergency Medicine

## 2021-10-19 DIAGNOSIS — I774 Celiac artery compression syndrome: Secondary | ICD-10-CM | POA: Diagnosis not present

## 2021-10-19 DIAGNOSIS — Z794 Long term (current) use of insulin: Secondary | ICD-10-CM | POA: Insufficient documentation

## 2021-10-19 DIAGNOSIS — J8410 Pulmonary fibrosis, unspecified: Secondary | ICD-10-CM | POA: Diagnosis not present

## 2021-10-19 DIAGNOSIS — Z87891 Personal history of nicotine dependence: Secondary | ICD-10-CM | POA: Insufficient documentation

## 2021-10-19 DIAGNOSIS — R0789 Other chest pain: Secondary | ICD-10-CM | POA: Diagnosis not present

## 2021-10-19 DIAGNOSIS — E1165 Type 2 diabetes mellitus with hyperglycemia: Secondary | ICD-10-CM | POA: Insufficient documentation

## 2021-10-19 DIAGNOSIS — Z7951 Long term (current) use of inhaled steroids: Secondary | ICD-10-CM | POA: Insufficient documentation

## 2021-10-19 DIAGNOSIS — K5730 Diverticulosis of large intestine without perforation or abscess without bleeding: Secondary | ICD-10-CM | POA: Diagnosis not present

## 2021-10-19 DIAGNOSIS — J449 Chronic obstructive pulmonary disease, unspecified: Secondary | ICD-10-CM | POA: Diagnosis not present

## 2021-10-19 DIAGNOSIS — R61 Generalized hyperhidrosis: Secondary | ICD-10-CM | POA: Insufficient documentation

## 2021-10-19 DIAGNOSIS — J439 Emphysema, unspecified: Secondary | ICD-10-CM | POA: Diagnosis not present

## 2021-10-19 DIAGNOSIS — Z79899 Other long term (current) drug therapy: Secondary | ICD-10-CM | POA: Insufficient documentation

## 2021-10-19 DIAGNOSIS — R079 Chest pain, unspecified: Secondary | ICD-10-CM

## 2021-10-19 DIAGNOSIS — K429 Umbilical hernia without obstruction or gangrene: Secondary | ICD-10-CM | POA: Diagnosis not present

## 2021-10-19 DIAGNOSIS — R110 Nausea: Secondary | ICD-10-CM | POA: Diagnosis not present

## 2021-10-19 DIAGNOSIS — I10 Essential (primary) hypertension: Secondary | ICD-10-CM | POA: Diagnosis not present

## 2021-10-19 DIAGNOSIS — Z7984 Long term (current) use of oral hypoglycemic drugs: Secondary | ICD-10-CM | POA: Diagnosis not present

## 2021-10-19 DIAGNOSIS — I2510 Atherosclerotic heart disease of native coronary artery without angina pectoris: Secondary | ICD-10-CM | POA: Diagnosis not present

## 2021-10-19 DIAGNOSIS — I745 Embolism and thrombosis of iliac artery: Secondary | ICD-10-CM | POA: Diagnosis not present

## 2021-10-19 LAB — CBC
HCT: 44.6 % (ref 39.0–52.0)
Hemoglobin: 15.4 g/dL (ref 13.0–17.0)
MCH: 31 pg (ref 26.0–34.0)
MCHC: 34.5 g/dL (ref 30.0–36.0)
MCV: 89.9 fL (ref 80.0–100.0)
Platelets: 261 10*3/uL (ref 150–400)
RBC: 4.96 MIL/uL (ref 4.22–5.81)
RDW: 13.4 % (ref 11.5–15.5)
WBC: 9.1 10*3/uL (ref 4.0–10.5)
nRBC: 0 % (ref 0.0–0.2)

## 2021-10-19 LAB — BASIC METABOLIC PANEL
Anion gap: 9 (ref 5–15)
BUN: 16 mg/dL (ref 8–23)
CO2: 24 mmol/L (ref 22–32)
Calcium: 9.7 mg/dL (ref 8.9–10.3)
Chloride: 106 mmol/L (ref 98–111)
Creatinine, Ser: 0.9 mg/dL (ref 0.61–1.24)
GFR, Estimated: >60 mL/min (ref >60)
Glucose, Bld: 124 mg/dL — ABNORMAL HIGH (ref 70–99)
Potassium: 3.9 mmol/L (ref 3.5–5.1)
Sodium: 139 mmol/L (ref 135–145)

## 2021-10-19 LAB — I-STAT CHEM 8, ED
BUN: 15 mg/dL (ref 8–23)
Calcium, Ion: 1.21 mmol/L (ref 1.15–1.40)
Chloride: 102 mmol/L (ref 98–111)
Creatinine, Ser: 0.8 mg/dL (ref 0.61–1.24)
Glucose, Bld: 125 mg/dL — ABNORMAL HIGH (ref 70–99)
HCT: 45 % (ref 39.0–52.0)
Hemoglobin: 15.3 g/dL (ref 13.0–17.0)
Potassium: 3.9 mmol/L (ref 3.5–5.1)
Sodium: 139 mmol/L (ref 135–145)
TCO2: 26 mmol/L (ref 22–32)

## 2021-10-19 LAB — TROPONIN I (HIGH SENSITIVITY)
Troponin I (High Sensitivity): 3 ng/L (ref <18)
Troponin I (High Sensitivity): 4 ng/L (ref <18)

## 2021-10-19 MED ORDER — CYCLOBENZAPRINE HCL 10 MG PO TABS: 10.0000 mg | ORAL_TABLET | Freq: Two times a day (BID) | ORAL | 0 refills | Status: DC | PRN

## 2021-10-19 MED ORDER — IOHEXOL 350 MG/ML SOLN
100.0000 mL | Freq: Once | INTRAVENOUS | Status: AC | PRN
  Administered 2021-10-19: 100 mL via INTRAVENOUS

## 2021-10-19 MED ORDER — SODIUM CHLORIDE (PF) 0.9 % IJ SOLN
INTRAMUSCULAR | Status: AC
  Filled 2021-10-19: qty 50

## 2021-10-19 MED ORDER — NAPROXEN 375 MG PO TABS: 375.0000 mg | ORAL_TABLET | Freq: Two times a day (BID) | ORAL | 0 refills | Status: DC

## 2021-10-19 MED ORDER — MORPHINE SULFATE (PF) 4 MG/ML IV SOLN
4.0000 mg | Freq: Once | INTRAVENOUS | Status: AC
  Administered 2021-10-19: 4 mg via INTRAVENOUS
  Filled 2021-10-19: qty 1

## 2021-10-19 MED ORDER — KETOROLAC TROMETHAMINE 15 MG/ML IJ SOLN
15.0000 mg | Freq: Once | INTRAMUSCULAR | Status: AC
  Administered 2021-10-19: 15 mg via INTRAVENOUS
  Filled 2021-10-19: qty 1

## 2021-10-19 NOTE — ED Provider Triage Note (Addendum)
Emergency Medicine Provider Triage Evaluation Note ? ?Duane Cuevas , a 66 y.o. male  was evaluated in triage.  Pt complains of chest pain or shortness of breath since this morning at 9 AM.  Patient denies any history of cardiac disease, CAD.  Patient denies any history of MI.  Patient reports that the chest pain is been progressively worsening over the course of the day.  Patient reports that the chest pain radiates into his left arm as well as into his back and he describes the pain as a "pressure".  Patient has history of COPD.  Chest pain is reproducible with palpation. ? ?Review of Systems  ?Positive: CP, SOB ?Negative: N, V, F ? ?Physical Exam  ?BP 103/74 (BP Location: Left Arm)   Pulse (!) 101   Temp 98.7 ?F (37.1 ?C) (Oral)   Resp 18   Ht '5\' 8"'$  (1.727 m)   Wt 76.7 kg   SpO2 96%   BMI 25.70 kg/m?  ?Gen:   Awake, no distress   ?Resp:  Normal effort.  Diffuse wheezing bilaterally ?MSK:   Moves extremities without difficulty  ?Other:  Chest pain reproducible palpation ? ?Medical Decision Making  ?Medically screening exam initiated at 4:00 PM.  Appropriate orders placed.  [name] was informed that the remainder of the evaluation will be completed by another provider, this initial triage assessment does not replace that evaluation, and the importance of remaining in the ED until their evaluation is complete. ? ? ?  ? ?  ?Azucena Cecil, PA-C ?10/19/21 1602 ? ?

## 2021-10-19 NOTE — Discharge Instructions (Signed)
Take the medications as prescribed.  Follow-up with your primary care doctor later this week to be rechecked ?

## 2021-10-19 NOTE — ED Triage Notes (Signed)
Patient c/o mid chest pain that radiates into the back and left arm since 0900 today. Patient also c/o SOB and nausea. ?

## 2021-10-19 NOTE — ED Provider Notes (Signed)
?[facility] DEPT ?Provider Note ? ? ?CSN: [id number] ?Arrival date & time: 10/19/21  1537 ? ?  ? ?History ? ?Chief Complaint  ?Patient presents with  ? Chest Pain  ? ? ?Duane Cuevas is a 66 y.o. male. ? ? ?Chest Pain ?Associated symptoms: no fever   ?Patient has history of hyperlipidemia COPD nonspecific chest pain, diabetes dysphagia, migraine.  Patient did quit smoking 6 months ago ?HPI: A 66 year old patient with a history of treated diabetes, hypertension and hypercholesterolemia presents for evaluation of chest pain. Initial onset of pain was approximately 3-6 hours ago. The patient's chest pain is described as heaviness/pressure/tightness and is worse with exertion. The patient complains of nausea and reports some diaphoresis. The patient's chest pain is middle- or left-sided, is not well-localized, is not sharp and does radiate to the arms/jaw/neck. The patient has no history of stroke, has no history of peripheral artery disease, has not smoked in the past 90 days, has no relevant family history of coronary artery disease (first degree relative at less than age 59) and does not have an elevated BMI (>=30).  ? ?Home Medications ?Prior to Admission medications   ?Medication Sig Start Date End Date Taking? Authorizing Provider  ?cyclobenzaprine (FLEXERIL) 10 MG tablet Take 1 tablet (10 mg total) by mouth 2 (two) times daily as needed for muscle spasms. 10/19/21  Yes Dorie Rank, MD  ?naproxen (NAPROSYN) 375 MG tablet Take 1 tablet (375 mg total) by mouth 2 (two) times daily. 10/19/21  Yes Dorie Rank, MD  ?albuterol (PROVENTIL) (2.5 MG/3ML) 0.083% nebulizer solution USE 1 VIAL VIA NEBULIZER EVERY 6 HOURS AS NEEDED FOR WHEEZING OR SHORTNESS OF BREATH ?Patient taking differently: Take 2.5 mg by nebulization every 6 (six) hours as needed for shortness of breath. USE 1 VIAL VIA NEBULIZER EVERY 6 HOURS AS NEEDED FOR WHEEZING OR SHORTNESS OF BREATH 05/24/21   [name], MD   ?albuterol (VENTOLIN HFA) 108 (90 Base) MCG/ACT inhaler Inhale 2 puffs into the lungs every 6 (six) hours as needed for wheezing or shortness of breath. 04/15/21   [name], MD  ?amLODipine (NORVASC) 5 MG tablet TOMAR 1 TABLETA POR VAI ORAL UNA VEZ AL DIA ?Patient taking differently: Take 5 mg by mouth daily. 10/13/21   [name], MD  ?atorvastatin (LIPITOR) 20 MG tablet TAKE ONE TABLET BY MOUTH ONE TIME DAILY 08/09/21   [name], MD  ?blood glucose meter kit and supplies KIT Dispense based on patient and insurance preference. Use up to four times daily as directed. 10/22/20   [name], MD  ?blood glucose meter kit and supplies Dispense based on patient and insurance preference. Use up to four times daily as directed. (FOR ICD-10 E10.9, E11.9). 10/24/20   [name], MD  ?Budeson-Glycopyrrol-Formoterol (BREZTRI AEROSPHERE) 160-9-4.8 MCG/ACT AERO Inhale 2 puffs into the lungs 2 (two) times daily. 07/22/21   [name], MD  ?dapagliflozin propanediol (FARXIGA) 10 MG TABS tablet Take 1 tablet (10 mg total) by mouth daily before breakfast. 08/10/21 11/08/21  [name], MD  ?Dulaglutide (TRULICITY) 1.5 DU/2.0UR SOPN Inject 1.5 mg into the skin once a week. 08/10/21   [name], MD  ?glipiZIDE (GLUCOTROL) 10 MG tablet Take 1 tablet (10 mg total) by mouth daily before breakfast. Takes 1 more tablet if blood sugar is high ?Patient taking differently: Take 10 mg by mouth 2 (two) times daily before a meal. Takes 1 more tablet if blood sugar is  high 05/17/21   [name], MD  ?glucose blood (ACCU-CHEK GUIDE) test strip Use as instructed 10/22/20   [name], MD  ?Insulin Pen Needle (B-D ULTRAFINE III SHORT PEN) 31G X 8 MM MISC USAR PARA INYECTAR INSULINA 10/13/21   [name], MD  ?Insulin Pen Needle (B-D ULTRAFINE III SHORT PEN) 31G X 8 MM MISC USAR PARA INYECTAR INSULINA 10/13/21   [name], MD   ?metFORMIN (GLUCOPHAGE) 1000 MG tablet TAKE ONE TABLET BY MOUTH TWICE A DAY WITH A MEAL ?Patient taking differently: Take 500 mg by mouth 2 (two) times daily with a meal. 06/29/21   Sagardia, Ines Bloomer, MD  ?methocarbamol (ROBAXIN) 500 MG tablet Take 1 tablet (500 mg total) by mouth 2 (two) times daily. ?Patient not taking: Reported on 09/16/2021 03/25/21   Blanchie Dessert, MD  ?ondansetron (ZOFRAN ODT) 4 MG disintegrating tablet 4mg  ODT q4 hours prn nausea/vomit ?Patient not taking: Reported on 09/16/2021 10/28/20   Deno Etienne, DO  ?pantoprazole (PROTONIX) 40 MG tablet Take 1 tablet (40 mg total) by mouth daily. 03/24/21 06/22/21  [name], MD  ?Ubrogepant (UBRELVY) 100 MG TABS Take 1 tablet by mouth daily. 05/26/21   Janith Lima, MD  ?   ? ?Allergies    ?Patient has no known allergies.   ? ?Review of Systems   ?Review of Systems  ?Constitutional:  Negative for fever.  ?Cardiovascular:  Positive for chest pain.  ? ?Physical Exam ?Updated Vital Signs ?BP 123/87   Pulse 85   Temp 98.7 ?F (37.1 ?C) (Oral)   Resp 17   Ht 1.727 m (5\' 8" )   Wt 76.7 kg   SpO2 96%   BMI 25.70 kg/m?  ?Physical Exam ?Vitals and nursing note reviewed.  ?Constitutional:   ?   Appearance: He is well-developed. He is not ill-appearing or diaphoretic.  ?HENT:  ?   Head: Normocephalic and atraumatic.  ?   Right Ear: External ear normal.  ?   Left Ear: External ear normal.  ?Eyes:  ?   General: No scleral icterus.    ?   Right eye: No discharge.     ?   Left eye: No discharge.  ?   Conjunctiva/sclera: Conjunctivae normal.  ?Neck:  ?   Trachea: No tracheal deviation.  ?Cardiovascular:  ?   Rate and Rhythm: Normal rate and regular rhythm.  ?Pulmonary:  ?   Effort: Pulmonary effort is normal. No respiratory distress.  ?   Breath sounds: Normal breath sounds. No stridor. No wheezing or rales.  ?Chest:  ?   Chest wall: Tenderness present. No crepitus or edema. There is no dullness to percussion.  ?   Comments: Palpation of chest  wall reproduces pain ?Abdominal:  ?   General: Bowel sounds are normal. There is no distension.  ?   Palpations: Abdomen is soft.  ?   Tenderness: There is no abdominal tenderness. There is no guarding or rebound.  ?Musculoskeletal:     ?   General: No tenderness or deformity.  ?   Cervical back: Neck supple.  ?Skin: ?   General: Skin is warm and dry.  ?   Findings: No rash.  ?Neurological:  ?   General: No focal deficit present.  ?   Mental Status: He is alert.  ?   Cranial Nerves: No cranial nerve deficit (no facial droop, extraocular movements intact, no slurred speech).  ?   Sensory: No sensory deficit.  ?  Motor: No abnormal muscle tone or seizure activity.  ?   Coordination: Coordination normal.  ?Psychiatric:     ?   Mood and Affect: Mood normal.  ? ? ?ED Results / Procedures / Treatments   ?Labs ?(all labs ordered are listed, but only abnormal results are displayed) ?Labs Reviewed  ?BASIC METABOLIC PANEL - Abnormal; Notable for the following components:  ?    Result Value  ? Glucose, Bld 124 (*)   ? All other components within normal limits  ?I-STAT CHEM 8, ED - Abnormal; Notable for the following components:  ? Glucose, Bld 125 (*)   ? All other components within normal limits  ?CBC  ?TROPONIN I (HIGH SENSITIVITY)  ?TROPONIN I (HIGH SENSITIVITY)  ? ? ?EKG ?EKG Interpretation ? ?Date/Time:  Tuesday October 19 2021 15:43:59 EDT ?Ventricular Rate:  105 ?PR Interval:  128 ?QRS Duration: 82 ?QT Interval:  340 ?QTC Calculation: 449 ?R Axis:   67 ?Text Interpretation: Sinus tachycardia Possible Left atrial enlargement Borderline ECG When compared with ECG of 06-Aug-2021 11:08, No significant change since last tracing Confirmed by Dorie Rank [phone]) on 10/19/2021 4:15:51 PM ? ?Radiology ?CT Angio Chest/Abd/Pel for Dissection W and/or Wo Contrast ? ?Result Date: 10/19/2021 ?CLINICAL DATA:  Abdominal pain. EXAM: CT ANGIOGRAPHY CHEST, ABDOMEN AND PELVIS TECHNIQUE: Non-contrast CT of the chest was initially obtained.  Multidetector CT imaging through the chest, abdomen and pelvis was performed using the standard protocol during bolus administration of intravenous contrast. Multiplanar reconstructed images and MIPs were obtained a

## 2021-10-31 DIAGNOSIS — E1165 Type 2 diabetes mellitus with hyperglycemia: Secondary | ICD-10-CM | POA: Diagnosis not present

## 2021-10-31 DIAGNOSIS — J449 Chronic obstructive pulmonary disease, unspecified: Secondary | ICD-10-CM | POA: Diagnosis not present

## 2021-11-11 ENCOUNTER — Telehealth: Payer: Self-pay

## 2021-11-11 NOTE — Progress Notes (Signed)
Chronic Care Management Pharmacy Assistant   Name: Duane Cuevas  MRN: [id number] DOB: September 17, 1955   Reason for Encounter: Disease State-General    Recent office visits:  None since the last coordination call on 10/13/21 CCM RN  Recent consult visits:  None since the last coordination call on 10/13/21 [facility] visits:  Medication Reconciliation was completed by comparing discharge summary, patient's EMR and Pharmacy list, and upon discussion with patient.  Admitted to the hospital on 10/19/21 due to chest pain. Discharge date was 10/19/21. Discharged from [facility]?Medications Started at [facility] Discharge:?? -started   cyclobenzaprine (FLEXERIL) 10 MG tablet naproxen (NAPROSYN) 375 MG tablet  Medications that remain the same after Hospital Discharge:??  -All other medications will remain the same.    Medications: Outpatient Encounter Medications as of 11/11/2021  Medication Sig Note   albuterol (PROVENTIL) (2.5 MG/3ML) 0.083% nebulizer solution USE 1 VIAL VIA NEBULIZER EVERY 6 HOURS AS NEEDED FOR WHEEZING OR SHORTNESS OF BREATH (Patient taking differently: Take 2.5 mg by nebulization every 6 (six) hours as needed for shortness of breath. USE 1 VIAL VIA NEBULIZER EVERY 6 HOURS AS NEEDED FOR WHEEZING OR SHORTNESS OF BREATH)    albuterol (VENTOLIN HFA) 108 (90 Base) MCG/ACT inhaler Inhale 2 puffs into the lungs every 6 (six) hours as needed for wheezing or shortness of breath.    amLODipine (NORVASC) 5 MG tablet TOMAR 1 TABLETA POR VAI ORAL UNA VEZ AL DIA (Patient taking differently: Take 5 mg by mouth daily.)    atorvastatin (LIPITOR) 20 MG tablet TAKE ONE TABLET BY MOUTH ONE TIME DAILY    blood glucose meter kit and supplies KIT Dispense based on patient and insurance preference. Use up to four times daily as directed. 12/22/2020: use   blood glucose meter kit and supplies Dispense based on patient and insurance preference. Use up to four  times daily as directed. (FOR ICD-10 E10.9, E11.9). 12/22/2020: use   Budeson-Glycopyrrol-Formoterol (BREZTRI AEROSPHERE) 160-9-4.8 MCG/ACT AERO Inhale 2 puffs into the lungs 2 (two) times daily.    cyclobenzaprine (FLEXERIL) 10 MG tablet Take 1 tablet (10 mg total) by mouth 2 (two) times daily as needed for muscle spasms.    Dulaglutide (TRULICITY) 1.5 EY/8.1KG SOPN Inject 1.5 mg into the skin once a week.    glipiZIDE (GLUCOTROL) 10 MG tablet Take 1 tablet (10 mg total) by mouth daily before breakfast. Takes 1 more tablet if blood sugar is high (Patient taking differently: Take 10 mg by mouth 2 (two) times daily before a meal. Takes 1 more tablet if blood sugar is high)    glucose blood (ACCU-CHEK GUIDE) test strip Use as instructed 12/22/2020: use   Insulin Pen Needle (B-D ULTRAFINE III SHORT PEN) 31G X 8 MM MISC USAR PARA INYECTAR INSULINA    Insulin Pen Needle (B-D ULTRAFINE III SHORT PEN) 31G X 8 MM MISC USAR PARA INYECTAR INSULINA    metFORMIN (GLUCOPHAGE) 1000 MG tablet TAKE ONE TABLET BY MOUTH TWICE A DAY WITH A MEAL (Patient taking differently: Take 500 mg by mouth 2 (two) times daily with a meal.)    methocarbamol (ROBAXIN) 500 MG tablet Take 1 tablet (500 mg total) by mouth 2 (two) times daily. (Patient not taking: Reported on 09/16/2021)    naproxen (NAPROSYN) 375 MG tablet Take 1 tablet (375 mg total) by mouth 2 (two) times daily.    ondansetron (ZOFRAN ODT) 4 MG disintegrating tablet 67m ODT q4 hours prn  nausea/vomit (Patient not taking: Reported on 09/16/2021)    pantoprazole (PROTONIX) 40 MG tablet Take 1 tablet (40 mg total) by mouth daily.    Ubrogepant (UBRELVY) 100 MG TABS Take 1 tablet by mouth daily.    Facility-Administered Encounter Medications as of 11/11/2021  Medication   morphine 2 MG/ML injection   Contacted Hettie Holstein for General Review Call   Chart Review:  Have there been any documented new, changed, or discontinued medications since last visit? No (If yes,  include name, dose, frequency, date) Has there been any documented recent hospitalizations or ED visits since last visit with Clinical Pharmacist? Yes Brief Summary (including medication and/or Diagnosis changes): 10/19/21 (chest pain) Medication changes: cyclobenzaprine (FLEXERIL) 10 MG tablet naproxen (NAPROSYN) 375 MG tablet   Adherence Review:  Does the Clinical Pharmacist Assistant have access to adherence rates? Yes Adherence rates for STAR metric medications (List medication(s)/day supply/ last 2 fill dates). Adherence rates for medications indicated for disease state being reviewed (List medication(s)/day supply/ last 2 fill dates). Does the patient have >5 day gap between last estimated fill dates for any of the above medications or other medication gaps? No Reason for medication gaps.     Next visit Type: telephone       Visit with:Clinical Pharmacist        Date:01/17/22        Time:10 am  Care Gaps: Colonoscopy-05/10/18 Diabetic Foot Exam-NA Ophthalmology-11/09/20 Dexa Scan - NA Annual Well Visit - NA Micro albumin-08/10/21 Hemoglobin A1c- 08/10/21  Star Rating Drugs: Trulicity 1.5 HT/3.4KA-JGOT fill 11/09/21 28 ds Atorvastatin 20 mg-last fill 11/09/21 90 ds Metformin 1000 mg-last fill 09/17/21 90 ds Glipizide 10 mg-last fill 09/17/21 30 ds  [place] Pharmacist Assistant [phone]

## 2021-11-23 ENCOUNTER — Encounter: Payer: Self-pay | Admitting: Emergency Medicine

## 2021-11-23 ENCOUNTER — Ambulatory Visit (INDEPENDENT_AMBULATORY_CARE_PROVIDER_SITE_OTHER): Payer: Medicare HMO | Admitting: Emergency Medicine

## 2021-11-23 VITALS — BP 126/78 | HR 104 | Temp 98.4°F | Ht 68.0 in | Wt 163.0 lb

## 2021-11-23 DIAGNOSIS — J449 Chronic obstructive pulmonary disease, unspecified: Secondary | ICD-10-CM | POA: Diagnosis not present

## 2021-11-23 DIAGNOSIS — E1169 Type 2 diabetes mellitus with other specified complication: Secondary | ICD-10-CM | POA: Diagnosis not present

## 2021-11-23 DIAGNOSIS — E1159 Type 2 diabetes mellitus with other circulatory complications: Secondary | ICD-10-CM | POA: Diagnosis not present

## 2021-11-23 DIAGNOSIS — E785 Hyperlipidemia, unspecified: Secondary | ICD-10-CM

## 2021-11-23 DIAGNOSIS — I152 Hypertension secondary to endocrine disorders: Secondary | ICD-10-CM | POA: Diagnosis not present

## 2021-11-23 LAB — POCT GLYCOSYLATED HEMOGLOBIN (HGB A1C): Hemoglobin A1C: 6.7 % — AB (ref 4.0–5.6)

## 2021-11-23 NOTE — Assessment & Plan Note (Signed)
Stable.  Diet and nutrition discussed. Continue atorvastatin 20 mg daily. 

## 2021-11-23 NOTE — Assessment & Plan Note (Addendum)
Well-controlled hypertension. Continue amlodipine 5 mg daily. Well-controlled diabetes with hemoglobin A1c of 6.7. Hypoglycemic episode 5 days ago due to skipping breakfast. Continue Trulicity 1.5 mg weekly, Farxiga 10 mg daily, and Tresiba 25 units daily.  Stop metformin Diet and nutrition discussed. Cardiovascular risks associated with hypertension and diabetes discussed. Follow-up in 3 months.

## 2021-11-23 NOTE — Assessment & Plan Note (Signed)
Stable.  Continue Breztri 2 inhalations twice a day and albuterol as rescue inhaler.

## 2021-11-23 NOTE — Progress Notes (Signed)
Duane Cuevas 66 y.o.   Chief Complaint  Patient presents with   Diabetes    HISTORY OF PRESENT ILLNESS: This is a 66 y.o. male with history of hypertension, diabetes and COPD here for follow-up. Compliant with diet and medications.  Doing well. Has no complaints or medical concerns today.  HPI   Prior to Admission medications   Medication Sig Start Date End Date Taking? Authorizing Provider  albuterol (PROVENTIL) (2.5 MG/3ML) 0.083% nebulizer solution USE 1 VIAL VIA NEBULIZER EVERY 6 HOURS AS NEEDED FOR WHEEZING OR SHORTNESS OF BREATH Patient taking differently: Take 2.5 mg by nebulization every 6 (six) hours as needed for shortness of breath. USE 1 VIAL VIA NEBULIZER EVERY 6 HOURS AS NEEDED FOR WHEEZING OR SHORTNESS OF BREATH 05/24/21  Yes Mitchael Luckey, Ines Bloomer, MD  albuterol (VENTOLIN HFA) 108 (90 Base) MCG/ACT inhaler Inhale 2 puffs into the lungs every 6 (six) hours as needed for wheezing or shortness of breath. 04/15/21  Yes Margeret Stachnik, Ines Bloomer, MD  amLODipine (NORVASC) 5 MG tablet TOMAR 1 TABLETA POR VAI ORAL UNA VEZ AL DIA Patient taking differently: Take 5 mg by mouth daily. 10/13/21  Yes Lauri Purdum, Ines Bloomer, MD  atorvastatin (LIPITOR) 20 MG tablet TAKE ONE TABLET BY MOUTH ONE TIME DAILY 08/09/21  Yes Zurisadai Helminiak, Ines Bloomer, MD  blood glucose meter kit and supplies KIT Dispense based on patient and insurance preference. Use up to four times daily as directed. 10/22/20  Yes Hazelyn Kallen, Ines Bloomer, MD  blood glucose meter kit and supplies Dispense based on patient and insurance preference. Use up to four times daily as directed. (FOR ICD-10 E10.9, E11.9). 10/24/20  Yes Evelise Reine, Ines Bloomer, MD  Budeson-Glycopyrrol-Formoterol (BREZTRI AEROSPHERE) 160-9-4.8 MCG/ACT AERO Inhale 2 puffs into the lungs 2 (two) times daily. 07/22/21  Yes Jeselle Hiser, Ines Bloomer, MD  cyclobenzaprine (FLEXERIL) 10 MG tablet Take 1 tablet (10 mg total) by mouth 2 (two) times daily as needed for muscle  spasms. 10/19/21  Yes Dorie Rank, MD  Dulaglutide (TRULICITY) 1.5 WC/3.7SE SOPN Inject 1.5 mg into the skin once a week. 08/10/21  Yes Shade Rivenbark, Ines Bloomer, MD  glipiZIDE (GLUCOTROL) 10 MG tablet Take 1 tablet (10 mg total) by mouth daily before breakfast. Takes 1 more tablet if blood sugar is high Patient taking differently: Take 10 mg by mouth 2 (two) times daily before a meal. Takes 1 more tablet if blood sugar is high 05/17/21  Yes Shaquilla Kehres, Ines Bloomer, MD  glucose blood (ACCU-CHEK GUIDE) test strip Use as instructed 10/22/20  Yes Calynn Ferrero, Ines Bloomer, MD  Insulin Pen Needle (B-D ULTRAFINE III SHORT PEN) 31G X 8 MM MISC USAR PARA INYECTAR INSULINA 10/13/21  Yes Gregery Walberg, Ines Bloomer, MD  Insulin Pen Needle (B-D ULTRAFINE III SHORT PEN) 31G X 8 MM MISC USAR PARA INYECTAR INSULINA 10/13/21  Yes Lonnie Reth, Ines Bloomer, MD  metFORMIN (GLUCOPHAGE) 1000 MG tablet TAKE ONE TABLET BY MOUTH TWICE A DAY WITH A MEAL Patient taking differently: Take 500 mg by mouth 2 (two) times daily with a meal. 06/29/21  Yes Pansey Pinheiro, Ines Bloomer, MD  naproxen (NAPROSYN) 375 MG tablet Take 1 tablet (375 mg total) by mouth 2 (two) times daily. 10/19/21  Yes Dorie Rank, MD  Ubrogepant (UBRELVY) 100 MG TABS Take 1 tablet by mouth daily. 05/26/21  Yes Janith Lima, MD  FARXIGA 10 MG TABS tablet Take 10 mg by mouth daily. 11/09/21   [provider]  methocarbamol (ROBAXIN) 500 MG tablet Take 1 tablet (500 mg total) by  mouth 2 (two) times daily. Patient not taking: Reported on 09/16/2021 03/25/21   Blanchie Dessert, MD  ondansetron (ZOFRAN ODT) 4 MG disintegrating tablet 89m ODT q4 hours prn nausea/vomit Patient not taking: Reported on 09/16/2021 10/28/20   FDeno Etienne DO  pantoprazole (PROTONIX) 40 MG tablet Take 1 tablet (40 mg total) by mouth daily. 03/24/21 06/22/21  SHorald Pollen MD  TRESIBA FLEXTOUCH 100 UNIT/ML FlexTouch Pen SMARTSIG:25 Unit(s) SUB-Q Daily 10/13/21   [provider]    No Known  Allergies  Patient Active Problem List   Diagnosis Date Noted   Sebaceous cyst 08/10/2021   Neck pain on left side 05/26/2021   Intractable persistent migraine aura without cerebral infarction and with status migrainosus 05/26/2021   Hypertension associated with diabetes ([facility] 09/21/2020   Other dysphagia    Hyperglycemia 09/13/2020   Uncontrolled type 2 diabetes mellitus with hyperglycemia ([facility] 09/13/2020   CAD (coronary artery disease) 01/23/2020   Hyperlipidemia 11/02/2019   History of MI (myocardial infarction) 11/02/2019   Nonspecific chest pain 11/01/2019   Cigarette smoker 08/15/2018   COPD ? GOLD III/ active smoker 08/14/2018   History of diet-controlled diabetes 06/30/2018   COPD (chronic obstructive pulmonary disease) ([facility] 10/14/2014   Dyslipidemia associated with type 2 diabetes mellitus ([facility] 10/14/2014    Past Medical History:  Diagnosis Date   Diabetes mellitus ([facility] 10/2014   pt denies being diabetic.    Emphysema of lung ([facility]    Emphysema/COPD ([facility] 10/2014   Hepatic steatosis    Thrombocytopenia ([facility] 09/2015   platelets in 120s.     Past Surgical History:  Procedure Laterality Date   BIOPSY  09/16/2020   Procedure: BIOPSY;  Surgeon: BThornton Park MD;  [facility];  Service: Gastroenterology;;   ESOPHAGOGASTRODUODENOSCOPY N/A 09/23/2015   Procedure: ESOPHAGOGASTRODUODENOSCOPY (EGD);  Surgeon: MLadene Artist MD;  [facility];  Service: Endoscopy;  Laterality: N/A;   ESOPHAGOGASTRODUODENOSCOPY (EGD) WITH PROPOFOL N/A 09/16/2020   Procedure: ESOPHAGOGASTRODUODENOSCOPY (EGD) WITH PROPOFOL;  Surgeon: BThornton Park MD;  [facility];  Service: Gastroenterology;  Laterality: N/A;   FOOT SURGERY     INGUINAL HERNIA REPAIR Left 05/15/2018   Procedure: LEFT INGUINAL HERNIA REPAIR WITH MESH;  Surgeon: CErroll Luna MD;  [facility]  Service: General;  Laterality: Left;   INSERTION OF MESH Left 05/15/2018   Procedure:  INSERTION OF MESH;  Surgeon: CErroll Luna MD;  [facility];  Service: General;  Laterality: Left;   UPPER GASTROINTESTINAL ENDOSCOPY      Social History   Socioeconomic History   Marital status: Married    Spouse name: Zoraida   Number of children: 6   Years of education: Not on file   Highest education level: Not on file  Occupational History   Occupation: Employed    Employer: [employer]  Tobacco Use   Smoking status: Former    Packs/day: 0.15    Years: 47.00    Pack years: 7.05    Types: Cigarettes   Smokeless tobacco: Never  Vaping Use   Vaping Use: Never used  Substance and Sexual Activity   Alcohol use: Yes    Comment: occ   Drug use: No   Sexual activity: Never  Other Topics Concern   Not on file  Social History Narrative   Lives with wife.  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   Social Determinants of Health   Financial Resource Strain: High Risk   Difficulty of Paying Living  Expenses: Hard  Food Insecurity: No Food Insecurity   Worried About Charity fundraiser in the Last Year: Never true   Ran Out of Food in the Last Year: Never true  Transportation Needs: No Transportation Needs   Lack of Transportation (Medical): No   Lack of Transportation (Non-Medical): No  Physical Activity: Sufficiently Active   Days of Exercise per Week: 5 days   Minutes of Exercise per Session: 30 min  Stress: No Stress Concern Present   Feeling of Stress : Not at all  Social Connections: Moderately Isolated   Frequency of Communication with Friends and Family: More than three times a week   Frequency of Social Gatherings with Friends and Family: More than three times a week   Attends Religious Services: Never   Marine scientist or Organizations: No   Attends Music therapist: Never   Marital Status: Married  Human resources officer Violence: Not At Risk   Fear of Current or Ex-Partner: No   Emotionally Abused: No   Physically Abused: No    Sexually Abused: No    Family History  Problem Relation Age of Onset   Emphysema Mother    Diabetes Mother    Emphysema Father    Cancer Sister        Stomach cancer   Diabetes Sister    Stomach cancer Sister    Diabetes Brother    Colon cancer Neg Hx    Colon polyps Neg Hx    Esophageal cancer Neg Hx    Rectal cancer Neg Hx      Review of Systems  Constitutional: Negative.  Negative for chills and fever.  HENT: Negative.  Negative for congestion and sore throat.   Respiratory: Negative.  Negative for hemoptysis, sputum production and shortness of breath.   Cardiovascular: Negative.  Negative for chest pain and palpitations.  Gastrointestinal:  Negative for abdominal pain, diarrhea, nausea and vomiting.  Genitourinary: Negative.   Musculoskeletal: Negative.   Skin: Negative.  Negative for rash.       Sebaceous cyst to back of the neck  Neurological:  Negative for dizziness and headaches.  All other systems reviewed and are negative.  Today's Vitals   11/23/21 0850  BP: 126/78  Pulse: (!) 104  Temp: 98.4 F (36.9 C)  TempSrc: Oral  SpO2: 93%  Weight: 163 lb (73.9 kg)  Height: 5' 8" (1.727 m)   Body mass index is 24.78 kg/m. Wt Readings from Last 3 Encounters:  11/23/21 163 lb (73.9 kg)  10/19/21 169 lb (76.7 kg)  08/10/21 168 lb (76.2 kg)   Lab Results  Component Value Date   HGBA1C 8.4 (A) 08/10/2021    Physical Exam Vitals reviewed.  Constitutional:      Appearance: Normal appearance.  HENT:     Head: Normocephalic.     Mouth/Throat:     Mouth: Mucous membranes are moist.     Pharynx: Oropharynx is clear.  Eyes:     Extraocular Movements: Extraocular movements intact.     Pupils: Pupils are equal, round, and reactive to light.  Neck:     Comments: Tender sebaceous cyst to posterior neck.  No erythema.  No fluctuation. Cardiovascular:     Rate and Rhythm: Normal rate and regular rhythm.     Pulses: Normal pulses.     Heart sounds: Normal  heart sounds.  Pulmonary:     Effort: Pulmonary effort is normal.     Breath sounds: Normal breath sounds.  Abdominal:     Palpations: Abdomen is soft.     Tenderness: There is no abdominal tenderness.  Musculoskeletal:        General: Normal range of motion.     Cervical back: No tenderness.  Lymphadenopathy:     Cervical: No cervical adenopathy.  Skin:    General: Skin is warm and dry.     Capillary Refill: Capillary refill takes less than 2 seconds.  Neurological:     General: No focal deficit present.     Mental Status: He is alert and oriented to person, place, and time.  Psychiatric:        Mood and Affect: Mood normal.        Behavior: Behavior normal.   Results for orders placed or performed in visit on 11/23/21 (from the past 24 hour(s))  POCT HgB A1C     Status: Abnormal   Collection Time: 11/23/21  9:09 AM  Result Value Ref Range   Hemoglobin A1C 6.7 (A) 4.0 - 5.6 %   HbA1c POC (<> result, manual entry)     HbA1c, POC (prediabetic range)     HbA1c, POC (controlled diabetic range)       ASSESSMENT & PLAN: A total of 47 minutes was spent with the patient and counseling/coordination of care regarding preparing for this visit, review of most recent office visit notes, review of all medications, review of multiple chronic medical problems under management, review of most recent blood work results including today's hemoglobin A1c, education on nutrition, cardiovascular risks associated with hypertension and diabetes, prognosis, documentation, and need for follow-up  Problem List Items Addressed This Visit       Cardiovascular and Mediastinum   Hypertension associated with diabetes ([place]) - Primary    Well-controlled hypertension. Continue amlodipine 5 mg daily. Well-controlled diabetes with hemoglobin A1c of 6.7. Hypoglycemic episode 5 days ago due to skipping breakfast. Continue Trulicity 1.5 mg weekly, Farxiga 10 mg daily, and Tresiba 25 units daily.  Stop  metformin Diet and nutrition discussed. Cardiovascular risks associated with hypertension and diabetes discussed. Follow-up in 3 months.       Relevant Medications   FARXIGA 10 MG TABS tablet   TRESIBA FLEXTOUCH 100 UNIT/ML FlexTouch Pen   Other Relevant Orders   POCT HgB A1C (Completed)     Respiratory   COPD (chronic obstructive pulmonary disease) (HCC)    Stable.  Continue Breztri 2 inhalations twice a day and albuterol as rescue inhaler.         Endocrine   Dyslipidemia associated with type 2 diabetes mellitus ([place])    Stable.  Diet and nutrition discussed.  Continue atorvastatin 20 mg daily.        Relevant Medications   FARXIGA 10 MG TABS tablet   TRESIBA FLEXTOUCH 100 UNIT/ML FlexTouch Pen   Patient Instructions  Stop metformin. Continue Trulicity, Farxiga, [place]. Follow-up in 3 months.  Diabetes mellitus y nutricin, en adultos Diabetes Mellitus and Nutrition, Adult Si sufre de diabetes, o diabetes mellitus, es muy importante tener hbitos alimenticios saludables debido a que sus niveles de Designer, television/film set sangre (glucosa) se ven afectados en gran medida por lo que come y bebe. [place] alimentos saludables en las cantidades correctas, aproximadamente a la misma hora todos los [place], [place] ayudar a: Chief Technology Officer su glucemia. Disminuir el riesgo de sufrir una enfermedad cardaca. Mejorar la presin arterial. Science writer o mantener un peso saludable. Qu puede afectar mi plan de alimentacin? Todas las personas que sufren de diabetes  son diferentes y cada una tiene necesidades diferentes en cuanto a un plan de alimentacin. El mdico puede recomendarle que trabaje con un nutricionista para elaborar el mejor plan para usted. Su plan de alimentacin puede variar segn factores como: Las caloras que necesita. Los medicamentos que toma. Su peso. Sus niveles de glucemia, presin arterial y colesterol. Su nivel de [place]. Otras afecciones que tenga, como enfermedades  cardacas o renales. Cmo me afectan los carbohidratos? Los carbohidratos, o hidratos de carbono, afectan su nivel de glucemia ms que cualquier otro tipo de alimento. La ingesta de carbohidratos aumenta la cantidad de [employer]. Es importante conocer la cantidad de carbohidratos que se pueden ingerir en cada comida sin correr Engineer, manufacturing. Esto es Psychologist, forensic. Su nutricionista puede ayudarlo a calcular la cantidad de carbohidratos que debe ingerir en cada comida y en cada refrigerio. Cmo me afecta el alcohol? El alcohol puede provocar una disminucin de la glucemia (hipoglucemia), especialmente si [place] insulina o toma determinados medicamentos por va oral para la diabetes. La hipoglucemia es una afeccin potencialmente mortal. Los sntomas de la hipoglucemia, como somnolencia, mareos y confusin, son similares a los sntomas de haber consumido demasiado alcohol. No beba alcohol si: Su mdico le indica no hacerlo. Est embarazada, puede estar embarazada o est tratando de [place]. Si bebe alcohol: Limite la cantidad que bebe a lo siguiente: De 0 a 1 medida por da para las mujeres. De 0 a 2 medidas por da para los hombres. Sepa cunta cantidad de alcohol hay en las bebidas que toma. En los [place], una medida equivale a una botella de cerveza de 12 oz (355 ml), un vaso de vino de 5 oz (148 ml) o un vaso de una bebida alcohlica de alta graduacin de 1 oz (44 ml). Mantngase hidratado bebiendo agua, refrescos dietticos o t helado sin azcar. Tenga en cuenta que los refrescos comunes, los jugos y otras bebidas para mezclar pueden contener Freight forwarder y se deben contar como carbohidratos. Consejos para seguir Company secretary las etiquetas de los alimentos Comience por leer el tamao de la porcin en la etiqueta de Informacin nutricional de los alimentos envasados y las bebidas. La cantidad de caloras, carbohidratos, grasas y otros nutrientes  detallados en la etiqueta se basan en una porcin del alimento. Muchos alimentos contienen ms de una porcin por envase. Verifique la cantidad total de gramos (g) de carbohidratos totales en una porcin. Verifique la cantidad de gramos de grasas saturadas y grasas trans en una porcin. Escoja alimentos que no contengan estas grasas o que su contenido de estas sea [place]. Verifique la cantidad de miligramos (mg) de sal (sodio) en una porcin. [place] [employer] Farm de las personas deben limitar la ingesta de sodio total a menos de 2300 mg Honeywell. Siempre consulte la informacin nutricional de los alimentos etiquetados como "con bajo contenido de grasa" o "sin grasa". Estos alimentos pueden tener un mayor contenido de Location manager agregada o carbohidratos refinados, y deben evitarse. Hable con su nutricionista para identificar sus objetivos diarios en cuanto a los nutrientes mencionados en la etiqueta. Al ir de compras Evite comprar alimentos procesados, enlatados o precocidos. Estos alimentos tienden a Special educational needs teacher mayor cantidad de [place], sodio y azcar agregada. Compre en la zona exterior de la tienda de comestibles. Esta es la zona donde se encuentran con mayor frecuencia las frutas y las verduras frescas, los cereales a granel, las carnes frescas y los productos lcteos frescos. Al cocinar Use  mtodos de coccin a baja temperatura, como hornear, en lugar de mtodos de coccin a alta temperatura, como frer en abundante aceite. Cocine con aceites saludables, como el aceite de [place], canola o [place]. Evite cocinar con manteca, crema o carnes con alto contenido de grasa. Planificacin de las comidas Coma las comidas y los refrigerios regularmente, preferentemente a la misma hora todos [place]. Evite pasar largos perodos de tiempo sin comer. Consuma alimentos ricos en fibra, como frutas frescas, verduras, frijoles y cereales integrales. Consuma entre 4 y 6 onzas (entre 112 y 168 g) de protenas magras por da, como  carnes [place], pollo, pescado, huevos o tofu. Una onza (oz) (28 g) de protena magra equivale a: 1 onza (28 g) de carne, pollo o pescado. 1 huevo.  taza (62 g) de tofu. Coma algunos alimentos por da que contengan grasas saludables, como aguacates, frutos secos, semillas y pescado. Qu alimentos debo comer? Lambert Mody Bayas. Manzanas. Naranjas. Duraznos. Damascos. Ciruelas. Uvas. Mangos. Papayas. Granadas. Kiwi. Cerezas. Verduras Verduras de Boeing, que incluyen [place], [place], col rizada, acelga, hojas de berza, hojas de mostaza y repollo. Remolachas. Coliflor. Brcoli. Zanahorias. Judas verdes. Tomates. Pimientos. Cebollas. Pepinos. Coles de Bruselas. Granos Granos integrales, como panes, galletas, tortillas, cereales y pastas de salvado o integrales. Avena sin azcar. Quinua. Arroz integral o salvaje. Carnes y otras protenas Frutos de mar. Carne de ave sin piel. Cortes magros de ave y carne de res. Tofu. Frutos secos. Semillas. Lcteos Productos lcteos sin grasa o con bajo contenido de [place], Bier, yogur y [place]. Es posible que los productos detallados arriba no constituyan una lista completa de los alimentos y las bebidas que puede tomar. Consulte a un nutricionista para obtener ms informacin. Qu alimentos debo evitar? Lambert Mody Frutas enlatadas al almbar. Verduras Verduras enlatadas. Verduras congeladas con mantequilla o salsa de crema. Granos Productos elaborados con [place] y [place], como panes, pastas, bocadillos y cereales. Evite todos los alimentos procesados. Carnes y otras protenas Cortes de carne con alto contenido de Lobbyist. Carne de ave con piel. Carnes empanizadas o fritas. Carne procesada. Evite las grasas saturadas. Lcteos Yogur, [place] enteros. Bebidas Bebidas azucaradas, como gaseosas o t helado. Es posible que los productos que se enumeran ms [place] no constituyan una lista completa de los alimentos y las bebidas que Materials engineer. Consulte a un nutricionista para obtener ms informacin. Preguntas para hacerle al mdico Debo consultar con un especialista certificado en atencin y educacin sobre la diabetes? Es necesario que me rena con un nutricionista? A qu nmero puedo llamar si tengo preguntas? Cules son los mejores momentos para controlar la glucemia? Dnde encontrar ms informacin: American Diabetes Association (Asociacin Estadounidense de la Diabetes): diabetes.org Academy of Nutrition and Dietetics (Academia de Nutricin y Information systems manager): eatright.Unisys Corporation of Diabetes and Digestive and Kidney Diseases ([place] la Diabetes y [place] y Renales): AmenCredit.is Association of Diabetes Care & Education Specialists (Asociacin de Especialistas en Atencin y Educacin sobre la Diabetes): diabeteseducator.org Resumen Es importante tener hbitos alimenticios saludables debido a que sus niveles de Designer, television/film set sangre (glucosa) se ven afectados en gran medida por lo que come y bebe. Es importante consumir alcohol con prudencia. Un plan de comidas saludable lo ayudar a controlar la glucosa en sangre y a reducir el riesgo de enfermedades cardacas. El mdico puede recomendarle que trabaje con un nutricionista para elaborar el mejor plan para usted. Esta informacin no tiene Marine scientist el consejo del mdico. Chief Strategy Officer  de hacerle al mdico cualquier pregunta que tenga. Document Revised: 02/26/2020 Document Reviewed: 02/26/2020 Elsevier Patient Education  [place], MD [facility]

## 2021-11-23 NOTE — Patient Instructions (Addendum)
Stop metformin. Continue Trulicity, Farxiga, [place]. Follow-up in 3 months.  Diabetes mellitus y nutricin, en adultos Diabetes Mellitus and Nutrition, Adult Si sufre de diabetes, o diabetes mellitus, es muy importante tener hbitos alimenticios saludables debido a que sus niveles de Designer, television/film set sangre (glucosa) se ven afectados en gran medida por lo que come y bebe. [place] alimentos saludables en las cantidades correctas, aproximadamente a la misma hora todos los [place], [place] ayudar a: Chief Technology Officer su glucemia. Disminuir el riesgo de sufrir una enfermedad cardaca. Mejorar la presin arterial. Science writer o mantener un peso saludable. Qu puede afectar mi plan de alimentacin? Todas las personas que sufren de diabetes son diferentes y cada una tiene necesidades diferentes en cuanto a un plan de alimentacin. El mdico puede recomendarle que trabaje con un nutricionista para elaborar el mejor plan para usted. Su plan de alimentacin puede variar segn factores como: Las caloras que necesita. Los medicamentos que toma. Su peso. Sus niveles de glucemia, presin arterial y colesterol. Su nivel de [place]. Otras afecciones que tenga, como enfermedades cardacas o renales. Cmo me afectan los carbohidratos? Los carbohidratos, o hidratos de carbono, afectan su nivel de glucemia ms que cualquier otro tipo de alimento. La ingesta de carbohidratos aumenta la cantidad de [employer]. Es importante conocer la cantidad de carbohidratos que se pueden ingerir en cada comida sin correr Engineer, manufacturing. Esto es Psychologist, forensic. Su nutricionista puede ayudarlo a calcular la cantidad de carbohidratos que debe ingerir en cada comida y en cada refrigerio. Cmo me afecta el alcohol? El alcohol puede provocar una disminucin de la glucemia (hipoglucemia), especialmente si [place] insulina o toma determinados medicamentos por va oral para la diabetes. La hipoglucemia es una afeccin potencialmente  mortal. Los sntomas de la hipoglucemia, como somnolencia, mareos y confusin, son similares a los sntomas de haber consumido demasiado alcohol. No beba alcohol si: Su mdico le indica no hacerlo. Est embarazada, puede estar embarazada o est tratando de [place]. Si bebe alcohol: Limite la cantidad que bebe a lo siguiente: De 0 a 1 medida por da para las mujeres. De 0 a 2 medidas por da para los hombres. Sepa cunta cantidad de alcohol hay en las bebidas que toma. En los [place], una medida equivale a una botella de cerveza de 12 oz (355 ml), un vaso de vino de 5 oz (148 ml) o un vaso de una bebida alcohlica de alta graduacin de 1 oz (44 ml). Mantngase hidratado bebiendo agua, refrescos dietticos o t helado sin azcar. Tenga en cuenta que los refrescos comunes, los jugos y otras bebidas para mezclar pueden contener Freight forwarder y se deben contar como carbohidratos. Consejos para seguir Company secretary las etiquetas de los alimentos Comience por leer el tamao de la porcin en la etiqueta de Informacin nutricional de los alimentos envasados y las bebidas. La cantidad de caloras, carbohidratos, grasas y otros nutrientes detallados en la etiqueta se basan en una porcin del alimento. Muchos alimentos contienen ms de una porcin por envase. Verifique la cantidad total de gramos (g) de carbohidratos totales en una porcin. Verifique la cantidad de gramos de grasas saturadas y grasas trans en una porcin. Escoja alimentos que no contengan estas grasas o que su contenido de estas sea [place]. Verifique la cantidad de miligramos (mg) de sal (sodio) en una porcin. La [employer] de las personas deben limitar la ingesta de sodio total a menos de 2300 mg Honeywell. Siempre consulte la informacin nutricional de  los alimentos etiquetados como "con bajo contenido de grasa" o "sin grasa". Estos alimentos pueden tener un mayor contenido de Location manager agregada o carbohidratos refinados, y deben  evitarse. Hable con su nutricionista para identificar sus objetivos diarios en cuanto a los nutrientes mencionados en la etiqueta. Al ir de compras Evite comprar alimentos procesados, enlatados o precocidos. Estos alimentos tienden a Special educational needs teacher mayor cantidad de [place], sodio y azcar agregada. Compre en la zona exterior de la tienda de comestibles. Esta es la zona donde se encuentran con mayor frecuencia las frutas y las verduras frescas, los cereales a granel, las carnes frescas y los productos lcteos frescos. Al cocinar Use mtodos de coccin a baja temperatura, como hornear, en lugar de mtodos de coccin a alta temperatura, como frer en abundante aceite. Cocine con aceites saludables, como el aceite de [place], canola o [place]. Evite cocinar con manteca, crema o carnes con alto contenido de grasa. Planificacin de las comidas Coma las comidas y los refrigerios regularmente, preferentemente a la misma hora todos [place]. Evite pasar largos perodos de tiempo sin comer. Consuma alimentos ricos en fibra, como frutas frescas, verduras, frijoles y cereales integrales. Consuma entre 4 y 6 onzas (entre 112 y 168 g) de protenas magras por da, como carnes [place], pollo, pescado, huevos o tofu. Una onza (oz) (28 g) de protena magra equivale a: 1 onza (28 g) de carne, pollo o pescado. 1 huevo.  taza (62 g) de tofu. Coma algunos alimentos por da que contengan grasas saludables, como aguacates, frutos secos, semillas y pescado. Qu alimentos debo comer? Lambert Mody Bayas. Manzanas. Naranjas. Duraznos. Damascos. Ciruelas. Uvas. Mangos. Papayas. Granadas. Kiwi. Cerezas. Verduras Verduras de Boeing, que incluyen [place], [place], col rizada, acelga, hojas de berza, hojas de mostaza y repollo. Remolachas. Coliflor. Brcoli. Zanahorias. Judas verdes. Tomates. Pimientos. Cebollas. Pepinos. Coles de Bruselas. Granos Granos integrales, como panes, galletas, tortillas, cereales y pastas de salvado o  integrales. Avena sin azcar. Quinua. Arroz integral o salvaje. Carnes y otras protenas Frutos de mar. Carne de ave sin piel. Cortes magros de ave y carne de res. Tofu. Frutos secos. Semillas. Lcteos Productos lcteos sin grasa o con bajo contenido de [place], [place], yogur y [place]. Es posible que los productos detallados arriba no constituyan una lista completa de los alimentos y las bebidas que puede tomar. Consulte a un nutricionista para obtener ms informacin. Qu alimentos debo evitar? Lambert Mody Frutas enlatadas al almbar. Verduras Verduras enlatadas. Verduras congeladas con mantequilla o salsa de crema. Granos Productos elaborados con [place] y [place], como panes, pastas, bocadillos y cereales. Evite todos los alimentos procesados. Carnes y otras protenas Cortes de carne con alto contenido de Lobbyist. Carne de ave con piel. Carnes empanizadas o fritas. Carne procesada. Evite las grasas saturadas. Lcteos [place]og[place], [place] enteros. Bebidas Bebidas azucaradas, como gaseosas o t helado. Es posible que los productos que se enumeran ms [place] no constituyan una lista completa de los alimentos y las bebidas que Nurse, adult. Consulte a un nutricionista para obtener ms informacin. Preguntas para hacerle al mdico Debo consultar con un especialista certificado en atencin y educacin sobre la diabetes? Es necesario que me rena con un nutricionista? A qu nmero puedo llamar si tengo preguntas? Cules son los mejores momentos para controlar la glucemia? Dnde encontrar ms informacin: American Diabetes Association (Asociacin Estadounidense de la Diabetes): diabetes.org Academy of Nutrition and Dietetics (Academia de Nutricin y Information systems manager): eatright.Unisys Corporation of Diabetes and Digestive and Kidney Diseases ([place]  la Diabetes y [place] y Renales): AmenCredit.is Association of Diabetes Care & Education Specialists  (Asociacin de Especialistas en Atencin y Educacin sobre la Diabetes): diabeteseducator.org Resumen Es importante tener hbitos alimenticios saludables debido a que sus niveles de Designer, television/film set sangre (glucosa) se ven afectados en gran medida por lo que come y bebe. Es importante consumir alcohol con prudencia. Un plan de comidas saludable lo ayudar a controlar la glucosa en sangre y a reducir el riesgo de enfermedades cardacas. El mdico puede recomendarle que trabaje con un nutricionista para elaborar el mejor plan para usted. Esta informacin no tiene Marine scientist el consejo del mdico. Asegrese de hacerle al mdico cualquier pregunta que tenga. Document Revised: 02/26/2020 Document Reviewed: 02/26/2020 Elsevier Patient Education  [place].

## 2021-12-08 ENCOUNTER — Inpatient Hospital Stay (HOSPITAL_COMMUNITY)
Admission: EM | Admit: 2021-12-08 | Discharge: 2021-12-10 | DRG: 192 | Disposition: A | Discharge status: Home / Self Care | Payer: Medicare HMO | Attending: Family Medicine | Admitting: Family Medicine

## 2021-12-08 ENCOUNTER — Other Ambulatory Visit: Payer: Self-pay

## 2021-12-08 ENCOUNTER — Encounter (HOSPITAL_COMMUNITY): Payer: Self-pay

## 2021-12-08 ENCOUNTER — Emergency Department (HOSPITAL_COMMUNITY): Payer: Medicare HMO

## 2021-12-08 DIAGNOSIS — K760 Fatty (change of) liver, not elsewhere classified: Secondary | ICD-10-CM | POA: Diagnosis present

## 2021-12-08 DIAGNOSIS — Z7984 Long term (current) use of oral hypoglycemic drugs: Secondary | ICD-10-CM

## 2021-12-08 DIAGNOSIS — Z20822 Contact with and (suspected) exposure to covid-19: Secondary | ICD-10-CM | POA: Diagnosis present

## 2021-12-08 DIAGNOSIS — J441 Chronic obstructive pulmonary disease with (acute) exacerbation: Secondary | ICD-10-CM | POA: Diagnosis not present

## 2021-12-08 DIAGNOSIS — E1165 Type 2 diabetes mellitus with hyperglycemia: Secondary | ICD-10-CM | POA: Diagnosis present

## 2021-12-08 DIAGNOSIS — R569 Unspecified convulsions: Secondary | ICD-10-CM | POA: Diagnosis present

## 2021-12-08 DIAGNOSIS — I152 Hypertension secondary to endocrine disorders: Secondary | ICD-10-CM | POA: Diagnosis present

## 2021-12-08 DIAGNOSIS — R0602 Shortness of breath: Secondary | ICD-10-CM | POA: Diagnosis not present

## 2021-12-08 DIAGNOSIS — E1169 Type 2 diabetes mellitus with other specified complication: Secondary | ICD-10-CM | POA: Diagnosis present

## 2021-12-08 DIAGNOSIS — I10 Essential (primary) hypertension: Secondary | ICD-10-CM | POA: Diagnosis present

## 2021-12-08 DIAGNOSIS — Z79899 Other long term (current) drug therapy: Secondary | ICD-10-CM

## 2021-12-08 DIAGNOSIS — E1159 Type 2 diabetes mellitus with other circulatory complications: Secondary | ICD-10-CM | POA: Diagnosis present

## 2021-12-08 DIAGNOSIS — Z7985 Long-term (current) use of injectable non-insulin antidiabetic drugs: Secondary | ICD-10-CM

## 2021-12-08 DIAGNOSIS — R079 Chest pain, unspecified: Secondary | ICD-10-CM | POA: Diagnosis not present

## 2021-12-08 DIAGNOSIS — Z7951 Long term (current) use of inhaled steroids: Secondary | ICD-10-CM

## 2021-12-08 DIAGNOSIS — Z794 Long term (current) use of insulin: Secondary | ICD-10-CM

## 2021-12-08 DIAGNOSIS — Z833 Family history of diabetes mellitus: Secondary | ICD-10-CM

## 2021-12-08 DIAGNOSIS — I2510 Atherosclerotic heart disease of native coronary artery without angina pectoris: Secondary | ICD-10-CM | POA: Diagnosis present

## 2021-12-08 DIAGNOSIS — Z825 Family history of asthma and other chronic lower respiratory diseases: Secondary | ICD-10-CM

## 2021-12-08 DIAGNOSIS — R059 Cough, unspecified: Secondary | ICD-10-CM | POA: Diagnosis not present

## 2021-12-08 DIAGNOSIS — J439 Emphysema, unspecified: Principal | ICD-10-CM | POA: Diagnosis present

## 2021-12-08 DIAGNOSIS — F17210 Nicotine dependence, cigarettes, uncomplicated: Secondary | ICD-10-CM | POA: Diagnosis present

## 2021-12-08 DIAGNOSIS — E119 Type 2 diabetes mellitus without complications: Secondary | ICD-10-CM | POA: Diagnosis present

## 2021-12-08 DIAGNOSIS — E785 Hyperlipidemia, unspecified: Secondary | ICD-10-CM | POA: Diagnosis present

## 2021-12-08 LAB — CBC
HCT: 47.7 % (ref 39.0–52.0)
Hemoglobin: 15.8 g/dL (ref 13.0–17.0)
MCH: 30.3 pg (ref 26.0–34.0)
MCHC: 33.1 g/dL (ref 30.0–36.0)
MCV: 91.4 fL (ref 80.0–100.0)
Platelets: 261 10*3/uL (ref 150–400)
RBC: 5.22 MIL/uL (ref 4.22–5.81)
RDW: 13.7 % (ref 11.5–15.5)
WBC: 11.1 10*3/uL — ABNORMAL HIGH (ref 4.0–10.5)
nRBC: 0 % (ref 0.0–0.2)

## 2021-12-08 LAB — BASIC METABOLIC PANEL
Anion gap: 9 (ref 5–15)
BUN: 19 mg/dL (ref 8–23)
CO2: 25 mmol/L (ref 22–32)
Calcium: 9.5 mg/dL (ref 8.9–10.3)
Chloride: 110 mmol/L (ref 98–111)
Creatinine, Ser: 0.9 mg/dL (ref 0.61–1.24)
GFR, Estimated: >60 mL/min (ref >60)
Glucose, Bld: 199 mg/dL — ABNORMAL HIGH (ref 70–99)
Potassium: 3.2 mmol/L — ABNORMAL LOW (ref 3.5–5.1)
Sodium: 144 mmol/L (ref 135–145)

## 2021-12-08 LAB — BLOOD GAS, VENOUS
Acid-Base Excess: 5.3 mmol/L — ABNORMAL HIGH (ref 0.0–2.0)
Bicarbonate: 31.1 mmol/L — ABNORMAL HIGH (ref 20.0–28.0)
O2 Saturation: 53.7 %
Patient temperature: 37
pCO2, Ven: 49 mmHg (ref 44–60)
pH, Ven: 7.41 (ref 7.25–7.43)
pO2, Ven: 31 mmHg — CL (ref 32–45)

## 2021-12-08 LAB — MAGNESIUM: Magnesium: 2.1 mg/dL (ref 1.7–2.4)

## 2021-12-08 LAB — TROPONIN I (HIGH SENSITIVITY)
Troponin I (High Sensitivity): 4 ng/L (ref <18)
Troponin I (High Sensitivity): 5 ng/L (ref <18)

## 2021-12-08 MED ORDER — METHYLPREDNISOLONE SODIUM SUCC 125 MG IJ SOLR
125.0000 mg | Freq: Once | INTRAMUSCULAR | Status: AC
  Administered 2021-12-08: 125 mg via INTRAVENOUS
  Filled 2021-12-08: qty 2

## 2021-12-08 MED ORDER — IPRATROPIUM-ALBUTEROL 0.5-2.5 (3) MG/3ML IN SOLN
3.0000 mL | RESPIRATORY_TRACT | Status: AC
  Administered 2021-12-08 (×3): 3 mL via RESPIRATORY_TRACT
  Filled 2021-12-08 (×3): qty 3

## 2021-12-08 MED ORDER — POTASSIUM CHLORIDE CRYS ER 20 MEQ PO TBCR
40.0000 meq | EXTENDED_RELEASE_TABLET | Freq: Once | ORAL | Status: AC
  Administered 2021-12-08: 40 meq via ORAL
  Filled 2021-12-08: qty 2

## 2021-12-08 MED ORDER — MAGNESIUM SULFATE 2 GM/50ML IV SOLN
2.0000 g | Freq: Once | INTRAVENOUS | Status: AC
  Administered 2021-12-08: 2 g via INTRAVENOUS
  Filled 2021-12-08: qty 50

## 2021-12-08 NOTE — ED Provider Triage Note (Signed)
Emergency Medicine Provider Triage Evaluation Note  Duane Cuevas , a 66 y.o. male  was evaluated in triage.  Pt complains of shortness of breath with chest discomfort for 2-week.  Patient states that he has history of COPD and feels like this is an exacerbation of the same.  Does complain of wheezing.  Denies abdominal pain, nausea, vomiting  Review of Systems  Positive: Shortness of breath, chest discomfort, wheezes Negative: Abdominal pain  Physical Exam  BP 118/76   Pulse 100   Temp 98.7 F (37.1 C) (Oral)   Resp 16   Ht '5\' 8"'$  (1.727 m)   Wt 76.7 kg   SpO2 92%   BMI 25.70 kg/m  Gen:   Awake, no distress   Resp:  Normal effort, scattered wheezes MSK:   Moves extremities without difficulty  Other:    Medical Decision Making  Medically screening exam initiated at 4:04 PM.  Appropriate orders placed.  Duane Cuevas was informed that the remainder of the evaluation will be completed by another provider, this initial triage assessment does not replace that evaluation, and the importance of remaining in the ED until their evaluation is complete.     Dorothyann Peng, PA-C 12/08/21 1606

## 2021-12-08 NOTE — ED Provider Notes (Signed)
[facility] DEPT Provider Note   CSN: [id number] Arrival date & time: 12/08/21  1539     History  Chief Complaint  Patient presents with   Chest Pain   Shortness of Breath    Duane Cuevas is a 66 y.o. male.   Chest Pain Associated symptoms: shortness of breath   Shortness of Breath Associated symptoms: chest pain     66 year old male who presents to the emergency department with shortness of breath and chest discomfort for the past 2 weeks.  He states that he is an active every day smoker.  He states that he has had a cough productive of sputum.  He has a history of COPD.  He complains of wheezing.  He denies any abdominal pain, nausea or vomiting. He endorses sharp pleuritic chest pain on the left without radiation.  No fevers or chills.  Home Medications Prior to Admission medications   Medication Sig Start Date End Date Taking? Authorizing Provider  albuterol (PROVENTIL) (2.5 MG/3ML) 0.083% nebulizer solution USE 1 VIAL VIA NEBULIZER EVERY 6 HOURS AS NEEDED FOR WHEEZING OR SHORTNESS OF BREATH Patient taking differently: Take 2.5 mg by nebulization every 6 (six) hours as needed for shortness of breath. USE 1 VIAL VIA NEBULIZER EVERY 6 HOURS AS NEEDED FOR WHEEZING OR SHORTNESS OF BREATH 05/24/21  Yes Sagardia, Ines Bloomer, MD  albuterol (VENTOLIN HFA) 108 (90 Base) MCG/ACT inhaler Inhale 2 puffs into the lungs every 6 (six) hours as needed for wheezing or shortness of breath. 04/15/21  Yes Sagardia, Ines Bloomer, MD  amLODipine (NORVASC) 5 MG tablet TOMAR 1 TABLETA POR VAI ORAL UNA VEZ AL DIA Patient taking differently: Take 5 mg by mouth daily. 10/13/21  Yes Sagardia, Ines Bloomer, MD  atorvastatin (LIPITOR) 20 MG tablet TAKE ONE TABLET BY MOUTH ONE TIME DAILY Patient taking differently: Take 20 mg by mouth daily. 08/09/21  Yes Sagardia, Ines Bloomer, MD  benzonatate (TESSALON PERLES) 100 MG capsule Take 1 capsule (100 mg total) by mouth 3 (three)  times daily as needed for cough. 12/10/21 12/10/22 Yes Lama, Marge Duncans, MD  Budeson-Glycopyrrol-Formoterol (BREZTRI AEROSPHERE) 160-9-4.8 MCG/ACT AERO Inhale 2 puffs into the lungs 2 (two) times daily. Patient taking differently: Inhale 2 puffs into the lungs daily as needed (for shortness of breath). 07/22/21  Yes Sagardia, Ines Bloomer, MD  Dulaglutide (TRULICITY) 1.5 FW/2.6VZ SOPN Inject 1.5 mg into the skin once a week. Patient taking differently: Inject 1.5 mg into the skin once a week. Thursday 08/10/21  Yes Sagardia, Ines Bloomer, MD  FARXIGA 10 MG TABS tablet Take 10 mg by mouth daily. 11/09/21  Yes [provider]  glipiZIDE (GLUCOTROL) 10 MG tablet Take 10 mg by mouth daily before breakfast. May take 1 additional tablet if blood glucose is 180 to 200 or higher   Yes [provider]  guaiFENesin (MUCINEX) 600 MG 12 hr tablet Take 1 tablet (600 mg total) by mouth 2 (two) times daily for 5 days. 12/10/21 12/15/21 Yes Lama, Marge Duncans, MD  pantoprazole (PROTONIX) 40 MG tablet Take 1 tablet (40 mg total) by mouth daily. 03/24/21 12/09/22 Yes Sagardia, Ines Bloomer, MD  predniSONE (DELTASONE) 10 MG tablet Prednisone 40 mg po daily x 1 day then Prednisone 30 mg po daily x 1 day then Prednisone 20 mg po daily x 1 day then Prednisone 10 mg daily x 1 day then stop... 12/10/21  Yes Lama, Marge Duncans, MD  TRESIBA FLEXTOUCH 100 UNIT/ML FlexTouch Pen Inject 25 Units into  the skin daily. 10/13/21  Yes [provider]  blood glucose meter kit and supplies KIT Dispense based on patient and insurance preference. Use up to four times daily as directed. 10/22/20   [name], MD  blood glucose meter kit and supplies Dispense based on patient and insurance preference. Use up to four times daily as directed. (FOR ICD-10 E10.9, E11.9). 10/24/20   [name], MD  cyclobenzaprine (FLEXERIL) 10 MG tablet Take 1 tablet (10 mg total) by mouth 2 (two) times daily as needed for muscle spasms. Patient not  taking: Reported on 12/08/2021 10/19/21   Dorie Rank, MD  doxycycline (VIBRA-TABS) 100 MG tablet Take 1 tablet (100 mg total) by mouth every 12 (twelve) hours. 12/10/21   [name], MD  glucose blood (ACCU-CHEK GUIDE) test strip Use as instructed 10/22/20   [name], MD  Insulin Pen Needle (B-D ULTRAFINE III SHORT PEN) 31G X 8 MM MISC USAR PARA INYECTAR INSULINA 10/13/21   [name], MD  Insulin Pen Needle (B-D ULTRAFINE III SHORT PEN) 31G X 8 MM MISC USAR PARA INYECTAR INSULINA 10/13/21   [name], MD  UBRELVY 100 MG TABS TOMAR 1 TALBETA POR VIA ORAL UNA VEZ AL DIA 12/09/21   Janith Lima, MD      Allergies    Patient has no known allergies.    Review of Systems   Review of Systems  Respiratory:  Positive for shortness of breath.   Cardiovascular:  Positive for chest pain.  All other systems reviewed and are negative.   Physical Exam Updated Vital Signs BP 110/75 (BP Location: Right Arm)   Pulse 87   Temp 98.1 F (36.7 C) (Oral)   Resp 16   Ht _0  (1.727 m)   Wt 72.3 kg   SpO2 94%   BMI 24.24 kg/m  Physical Exam Vitals and nursing note reviewed.  Constitutional:      General: He is not in acute distress.    Appearance: He is well-developed.  HENT:     Head: Normocephalic and atraumatic.  Eyes:     Conjunctiva/sclera: Conjunctivae normal.  Cardiovascular:     Rate and Rhythm: Normal rate and regular rhythm.     Heart sounds: No murmur heard. Pulmonary:     Effort: Pulmonary effort is normal. No respiratory distress.     Breath sounds: Examination of the right-upper field reveals wheezing. Examination of the left-upper field reveals wheezing. Examination of the right-middle field reveals wheezing. Examination of the left-middle field reveals wheezing. Examination of the right-lower field reveals wheezing. Examination of the left-lower field reveals wheezing. Wheezing present.  Abdominal:     Palpations: Abdomen is soft.      Tenderness: There is no abdominal tenderness.  Musculoskeletal:        General: No swelling.     Cervical back: Neck supple.  Skin:    General: Skin is warm and dry.     Capillary Refill: Capillary refill takes less than 2 seconds.  Neurological:     Mental Status: He is alert.  Psychiatric:        Mood and Affect: Mood normal.     ED Results / Procedures / Treatments   Labs (all labs ordered are listed, but only abnormal results are displayed) Labs Reviewed  BASIC METABOLIC PANEL - Abnormal; Notable for the following components:      Result Value   Potassium 3.2 (*)    Glucose, Bld 199 (*)  All other components within normal limits  CBC - Abnormal; Notable for the following components:   WBC 11.1 (*)    All other components within normal limits  BLOOD GAS, VENOUS - Abnormal; Notable for the following components:   pO2, Ven 31 (*)    Bicarbonate 31.1 (*)    Acid-Base Excess 5.3 (*)    All other components within normal limits  CBC WITH DIFFERENTIAL/PLATELET - Abnormal; Notable for the following components:   WBC 13.0 (*)    Neutro Abs 12.2 (*)    Lymphs Abs 0.5 (*)    Abs Immature Granulocytes 0.11 (*)    All other components within normal limits  COMPREHENSIVE METABOLIC PANEL - Abnormal; Notable for the following components:   Glucose, Bld 169 (*)    All other components within normal limits  MAGNESIUM - Abnormal; Notable for the following components:   Magnesium 3.4 (*)    All other components within normal limits  HEMOGLOBIN A1C - Abnormal; Notable for the following components:   Hgb A1c MFr Bld 6.6 (*)    All other components within normal limits  GLUCOSE, CAPILLARY - Abnormal; Notable for the following components:   Glucose-Capillary 277 (*)    All other components within normal limits  GLUCOSE, CAPILLARY - Abnormal; Notable for the following components:   Glucose-Capillary 218 (*)    All other components within normal limits  GLUCOSE, CAPILLARY - Abnormal;  Notable for the following components:   Glucose-Capillary 249 (*)    All other components within normal limits  CBG MONITORING, ED - Abnormal; Notable for the following components:   Glucose-Capillary 171 (*)    All other components within normal limits  SARS CORONAVIRUS 2 BY RT PCR  MAGNESIUM  HIV ANTIBODY (ROUTINE TESTING W REFLEX)  TROPONIN I (HIGH SENSITIVITY)  TROPONIN I (HIGH SENSITIVITY)    EKG EKG Interpretation  Date/Time:  Wednesday December 08 2021 20:45:16 EDT Ventricular Rate:  87 PR Interval:  124 QRS Duration: 87 QT Interval:  359 QTC Calculation: 432 R Axis:   82 Text Interpretation: Sinus rhythm Probable left atrial enlargement Borderline right axis deviation Confirmed by Regan Lemming ([id number]) on 12/08/2021 9:42:18 PM  Radiology No results found.  Procedures Procedures    Medications Ordered in ED Medications  sterile water (preservative free) injection (has no administration in time range)  potassium chloride SA (KLOR-CON M) CR tablet 40 mEq (40 mEq Oral Given 12/08/21 2119)  ipratropium-albuterol (DUONEB) 0.5-2.5 (3) MG/3ML nebulizer solution 3 mL (3 mLs Nebulization Given 12/08/21 2353)  methylPREDNISolone sodium succinate (SOLU-MEDROL) 125 mg/2 mL injection 125 mg (125 mg Intravenous Given 12/08/21 2210)  magnesium sulfate IVPB 2 g 50 mL (0 g Intravenous Stopped 12/09/21 0021)  benzonatate (TESSALON) capsule 200 mg (200 mg Oral Given 12/10/21 6269)    ED Course/ Medical Decision Making/ A&P                           Medical Decision Making Amount and/or Complexity of Data Reviewed Labs: ordered. Radiology: ordered.  Risk Prescription drug management. Decision regarding hospitalization.   66 year old male who presents to the emergency department with shortness of breath and chest discomfort for the past 2 weeks.  He states that he is an active every day smoker.  He states that he has had a cough productive of sputum.  He has a history of COPD.  He complains  of wheezing.  He denies any abdominal pain, nausea or vomiting.  He endorses sharp pleuritic chest pain on the left without radiation.  No fevers or chills.  On arrival, the patient was vitally stable, O2 sats 92% on room air, sinus rhythm noted on cardiac telemetry.  Physical exam significant for diffuse expiratory wheezing in this patient with known COPD, concerning for COPD exacerbation.  The patient was administered DuoNebs x3, IV Solu-Medrol, IV magnesium.  Chest x-ray was performed and revealed chronic emphysematous changes without any acute disease.  I personally interpreted this x-ray.  Following treatment, the patient continued to endorse symptoms.  He does not require escalation to continuous albuterol treatment at this time however does require admission for further treatment of COPD exacerbation.  Hospitalist medicine consulted for admission and Dr. Bridgett Larsson of hospitalist medicine accepted the patient in admission.   Final Clinical Impression(s) / ED Diagnoses Final diagnoses:  COPD exacerbation ([place])    Rx / DC Orders ED Discharge Orders          Ordered    doxycycline (VIBRA-TABS) 100 MG tablet  Every 12 hours        12/10/21 0832    guaiFENesin (MUCINEX) 600 MG 12 hr tablet  2 times daily        12/10/21 0832    benzonatate (TESSALON PERLES) 100 MG capsule  3 times daily PRN        12/10/21 0832    predniSONE (DELTASONE) 10 MG tablet        12/10/21 0832    Increase activity slowly        12/10/21 0832    Diet - low sodium heart healthy        12/10/21 0832              Regan Lemming, MD 12/11/21 [phone]

## 2021-12-08 NOTE — ED Triage Notes (Signed)
Pt reports chest pain, SHOB, and cough x 2 weeks. Hx of COPD.

## 2021-12-09 ENCOUNTER — Encounter (HOSPITAL_COMMUNITY): Payer: Self-pay | Admitting: Internal Medicine

## 2021-12-09 ENCOUNTER — Other Ambulatory Visit: Payer: Self-pay | Admitting: Internal Medicine

## 2021-12-09 DIAGNOSIS — Z7951 Long term (current) use of inhaled steroids: Secondary | ICD-10-CM | POA: Diagnosis not present

## 2021-12-09 DIAGNOSIS — E785 Hyperlipidemia, unspecified: Secondary | ICD-10-CM

## 2021-12-09 DIAGNOSIS — R569 Unspecified convulsions: Secondary | ICD-10-CM | POA: Diagnosis not present

## 2021-12-09 DIAGNOSIS — Z833 Family history of diabetes mellitus: Secondary | ICD-10-CM | POA: Diagnosis not present

## 2021-12-09 DIAGNOSIS — Z7985 Long-term (current) use of injectable non-insulin antidiabetic drugs: Secondary | ICD-10-CM | POA: Diagnosis not present

## 2021-12-09 DIAGNOSIS — R69 Illness, unspecified: Secondary | ICD-10-CM | POA: Diagnosis not present

## 2021-12-09 DIAGNOSIS — I152 Hypertension secondary to endocrine disorders: Secondary | ICD-10-CM | POA: Diagnosis not present

## 2021-12-09 DIAGNOSIS — J439 Emphysema, unspecified: Secondary | ICD-10-CM | POA: Diagnosis not present

## 2021-12-09 DIAGNOSIS — I2510 Atherosclerotic heart disease of native coronary artery without angina pectoris: Secondary | ICD-10-CM | POA: Diagnosis not present

## 2021-12-09 DIAGNOSIS — Z20822 Contact with and (suspected) exposure to covid-19: Secondary | ICD-10-CM | POA: Diagnosis not present

## 2021-12-09 DIAGNOSIS — F17210 Nicotine dependence, cigarettes, uncomplicated: Secondary | ICD-10-CM

## 2021-12-09 DIAGNOSIS — E1159 Type 2 diabetes mellitus with other circulatory complications: Secondary | ICD-10-CM | POA: Diagnosis not present

## 2021-12-09 DIAGNOSIS — J441 Chronic obstructive pulmonary disease with (acute) exacerbation: Secondary | ICD-10-CM | POA: Diagnosis present

## 2021-12-09 DIAGNOSIS — G43511 Persistent migraine aura without cerebral infarction, intractable, with status migrainosus: Secondary | ICD-10-CM

## 2021-12-09 DIAGNOSIS — E1169 Type 2 diabetes mellitus with other specified complication: Secondary | ICD-10-CM

## 2021-12-09 DIAGNOSIS — Z794 Long term (current) use of insulin: Secondary | ICD-10-CM | POA: Diagnosis not present

## 2021-12-09 DIAGNOSIS — E1165 Type 2 diabetes mellitus with hyperglycemia: Secondary | ICD-10-CM

## 2021-12-09 DIAGNOSIS — Z825 Family history of asthma and other chronic lower respiratory diseases: Secondary | ICD-10-CM | POA: Diagnosis not present

## 2021-12-09 DIAGNOSIS — K760 Fatty (change of) liver, not elsewhere classified: Secondary | ICD-10-CM | POA: Diagnosis not present

## 2021-12-09 DIAGNOSIS — Z79899 Other long term (current) drug therapy: Secondary | ICD-10-CM | POA: Diagnosis not present

## 2021-12-09 DIAGNOSIS — Z7984 Long term (current) use of oral hypoglycemic drugs: Secondary | ICD-10-CM | POA: Diagnosis not present

## 2021-12-09 LAB — COMPREHENSIVE METABOLIC PANEL
ALT: 22 U/L (ref 0–44)
AST: 16 U/L (ref 15–41)
Albumin: 3.7 g/dL (ref 3.5–5.0)
Alkaline Phosphatase: 80 U/L (ref 38–126)
Anion gap: 11 (ref 5–15)
BUN: 20 mg/dL (ref 8–23)
CO2: 25 mmol/L (ref 22–32)
Calcium: 9.4 mg/dL (ref 8.9–10.3)
Chloride: 103 mmol/L (ref 98–111)
Creatinine, Ser: 0.91 mg/dL (ref 0.61–1.24)
GFR, Estimated: >60 mL/min (ref >60)
Glucose, Bld: 169 mg/dL — ABNORMAL HIGH (ref 70–99)
Potassium: 4.3 mmol/L (ref 3.5–5.1)
Sodium: 139 mmol/L (ref 135–145)
Total Bilirubin: 0.7 mg/dL (ref 0.3–1.2)
Total Protein: 7.4 g/dL (ref 6.5–8.1)

## 2021-12-09 LAB — CBC WITH DIFFERENTIAL/PLATELET
Abs Immature Granulocytes: 0.11 10*3/uL — ABNORMAL HIGH (ref 0.00–0.07)
Basophils Absolute: 0 10*3/uL (ref 0.0–0.1)
Basophils Relative: 0 %
Eosinophils Absolute: 0 10*3/uL (ref 0.0–0.5)
Eosinophils Relative: 0 %
HCT: 47.1 % (ref 39.0–52.0)
Hemoglobin: 15.7 g/dL (ref 13.0–17.0)
Immature Granulocytes: 1 %
Lymphocytes Relative: 4 %
Lymphs Abs: 0.5 10*3/uL — ABNORMAL LOW (ref 0.7–4.0)
MCH: 30.6 pg (ref 26.0–34.0)
MCHC: 33.3 g/dL (ref 30.0–36.0)
MCV: 91.8 fL (ref 80.0–100.0)
Monocytes Absolute: 0.1 10*3/uL (ref 0.1–1.0)
Monocytes Relative: 1 %
Neutro Abs: 12.2 10*3/uL — ABNORMAL HIGH (ref 1.7–7.7)
Neutrophils Relative %: 94 %
Platelets: 258 10*3/uL (ref 150–400)
RBC: 5.13 MIL/uL (ref 4.22–5.81)
RDW: 13.5 % (ref 11.5–15.5)
WBC: 13 10*3/uL — ABNORMAL HIGH (ref 4.0–10.5)
nRBC: 0 % (ref 0.0–0.2)

## 2021-12-09 LAB — GLUCOSE, CAPILLARY
Glucose-Capillary: 277 mg/dL — ABNORMAL HIGH (ref 70–99)
Glucose-Capillary: 218 mg/dL — ABNORMAL HIGH (ref 70–99)

## 2021-12-09 LAB — HIV ANTIBODY (ROUTINE TESTING W REFLEX): HIV Screen 4th Generation wRfx: NONREACTIVE

## 2021-12-09 LAB — HEMOGLOBIN A1C
Hgb A1c MFr Bld: 6.6 % — ABNORMAL HIGH (ref 4.8–5.6)
Mean Plasma Glucose: 142.72 mg/dL

## 2021-12-09 LAB — MAGNESIUM: Magnesium: 3.4 mg/dL — ABNORMAL HIGH (ref 1.7–2.4)

## 2021-12-09 LAB — SARS CORONAVIRUS 2 BY RT PCR: SARS Coronavirus 2 by RT PCR: NEGATIVE

## 2021-12-09 LAB — CBG MONITORING, ED: Glucose-Capillary: 171 mg/dL — ABNORMAL HIGH (ref 70–99)

## 2021-12-09 MED ORDER — INSULIN ASPART 100 UNIT/ML IJ SOLN
0.0000 [IU] | Freq: Three times a day (TID) | INTRAMUSCULAR | Status: DC
  Administered 2021-12-09: 3 [IU] via SUBCUTANEOUS
  Administered 2021-12-09: 4 [IU] via SUBCUTANEOUS
  Administered 2021-12-09: 11 [IU] via SUBCUTANEOUS
  Filled 2021-12-09: qty 0.2

## 2021-12-09 MED ORDER — AMLODIPINE BESYLATE 5 MG PO TABS
5.0000 mg | ORAL_TABLET | Freq: Every day | ORAL | Status: DC
  Administered 2021-12-09: 5 mg via ORAL
  Filled 2021-12-09: qty 1

## 2021-12-09 MED ORDER — IPRATROPIUM-ALBUTEROL 0.5-2.5 (3) MG/3ML IN SOLN
3.0000 mL | Freq: Three times a day (TID) | RESPIRATORY_TRACT | Status: DC
  Administered 2021-12-10: 3 mL via RESPIRATORY_TRACT
  Filled 2021-12-09: qty 3

## 2021-12-09 MED ORDER — METHYLPREDNISOLONE SODIUM SUCC 40 MG IJ SOLR
40.0000 mg | Freq: Two times a day (BID) | INTRAMUSCULAR | Status: DC
Start: 2021-12-09 — End: 2021-12-10
  Administered 2021-12-09 (×2): 40 mg via INTRAVENOUS
  Filled 2021-12-09 (×2): qty 1

## 2021-12-09 MED ORDER — NICOTINE 14 MG/24HR TD PT24: 14.0000 mg | MEDICATED_PATCH | Freq: Every day | TRANSDERMAL | Status: DC | PRN

## 2021-12-09 MED ORDER — IPRATROPIUM-ALBUTEROL 0.5-2.5 (3) MG/3ML IN SOLN: 3.0000 mL | Freq: Four times a day (QID) | RESPIRATORY_TRACT | Status: DC

## 2021-12-09 MED ORDER — PREDNISONE 20 MG PO TABS: 40.0000 mg | ORAL_TABLET | Freq: Every day | ORAL | Status: DC

## 2021-12-09 MED ORDER — PANTOPRAZOLE SODIUM 40 MG PO TBEC
40.0000 mg | DELAYED_RELEASE_TABLET | Freq: Every day | ORAL | Status: DC
  Administered 2021-12-09: 40 mg via ORAL
  Filled 2021-12-09: qty 1

## 2021-12-09 MED ORDER — ONDANSETRON HCL 4 MG PO TABS: 4.0000 mg | ORAL_TABLET | Freq: Four times a day (QID) | ORAL | Status: DC | PRN

## 2021-12-09 MED ORDER — ACETAMINOPHEN 325 MG PO TABS: 650.0000 mg | ORAL_TABLET | Freq: Four times a day (QID) | ORAL | Status: DC | PRN

## 2021-12-09 MED ORDER — OXYCODONE-ACETAMINOPHEN 5-325 MG PO TABS
1.0000 | ORAL_TABLET | Freq: Four times a day (QID) | ORAL | Status: DC | PRN
  Administered 2021-12-09: 1 via ORAL
  Filled 2021-12-09: qty 1

## 2021-12-09 MED ORDER — ATORVASTATIN CALCIUM 10 MG PO TABS
20.0000 mg | ORAL_TABLET | Freq: Every day | ORAL | Status: DC
  Administered 2021-12-09: 20 mg via ORAL
  Filled 2021-12-09: qty 2

## 2021-12-09 MED ORDER — STERILE WATER FOR INJECTION IJ SOLN
INTRAMUSCULAR | Status: AC
  Filled 2021-12-09: qty 10

## 2021-12-09 MED ORDER — BENZONATATE 100 MG PO CAPS
200.0000 mg | ORAL_CAPSULE | Freq: Three times a day (TID) | ORAL | Status: AC | PRN
  Administered 2021-12-09 – 2021-12-10 (×3): 200 mg via ORAL
  Filled 2021-12-09 (×3): qty 2

## 2021-12-09 MED ORDER — INSULIN ASPART 100 UNIT/ML IJ SOLN
0.0000 [IU] | Freq: Every day | INTRAMUSCULAR | Status: DC
  Administered 2021-12-09: 2 [IU] via SUBCUTANEOUS
  Filled 2021-12-09: qty 0.05

## 2021-12-09 MED ORDER — ONDANSETRON HCL 4 MG/2ML IJ SOLN: 4.0000 mg | Freq: Four times a day (QID) | INTRAMUSCULAR | Status: DC | PRN

## 2021-12-09 MED ORDER — DAPAGLIFLOZIN PROPANEDIOL 10 MG PO TABS
10.0000 mg | ORAL_TABLET | Freq: Every day | ORAL | Status: DC
  Filled 2021-12-09: qty 1

## 2021-12-09 MED ORDER — GUAIFENESIN ER 600 MG PO TB12
1200.0000 mg | ORAL_TABLET | Freq: Two times a day (BID) | ORAL | Status: DC
  Administered 2021-12-09 (×2): 1200 mg via ORAL
  Filled 2021-12-09 (×2): qty 2

## 2021-12-09 MED ORDER — HEPARIN SODIUM (PORCINE) 5000 UNIT/ML IJ SOLN
5000.0000 [IU] | Freq: Three times a day (TID) | INTRAMUSCULAR | Status: DC
  Administered 2021-12-09 – 2021-12-10 (×4): 5000 [IU] via SUBCUTANEOUS
  Filled 2021-12-09 (×4): qty 1

## 2021-12-09 MED ORDER — DOXYCYCLINE HYCLATE 100 MG PO TABS
100.0000 mg | ORAL_TABLET | Freq: Two times a day (BID) | ORAL | Status: DC
  Administered 2021-12-09 (×2): 100 mg via ORAL
  Filled 2021-12-09 (×3): qty 1

## 2021-12-09 MED ORDER — INSULIN GLARGINE-YFGN 100 UNIT/ML ~~LOC~~ SOLN
25.0000 [IU] | Freq: Every day | SUBCUTANEOUS | Status: DC
Start: 2021-12-10 — End: 2021-12-10
  Filled 2021-12-09: qty 0.25

## 2021-12-09 MED ORDER — IPRATROPIUM-ALBUTEROL 0.5-2.5 (3) MG/3ML IN SOLN
3.0000 mL | Freq: Four times a day (QID) | RESPIRATORY_TRACT | Status: DC
  Administered 2021-12-09 (×3): 3 mL via RESPIRATORY_TRACT
  Filled 2021-12-09 (×3): qty 3

## 2021-12-09 MED ORDER — GUAIFENESIN-DM 100-10 MG/5ML PO SYRP
15.0000 mL | ORAL_SOLUTION | ORAL | Status: DC | PRN
  Administered 2021-12-09: 15 mL via ORAL
  Filled 2021-12-09: qty 20

## 2021-12-09 MED ORDER — ACETAMINOPHEN 650 MG RE SUPP: 650.0000 mg | Freq: Four times a day (QID) | RECTAL | Status: DC | PRN

## 2021-12-09 MED ORDER — GLIPIZIDE 10 MG PO TABS
10.0000 mg | ORAL_TABLET | Freq: Every day | ORAL | Status: DC
  Administered 2021-12-09: 10 mg via ORAL
  Filled 2021-12-09: qty 1

## 2021-12-09 NOTE — Progress Notes (Signed)
Subjective: Patient admitted this morning, see detailed H&P by Dr Bridgett Larsson 66 year old male with a history of COPD, diabetes mellitus type 2, chronic tobacco abuse, hyperlipidemia, history of CAD, hypertension came to ED with clinically complaints of cough, shortness of breath, nonproductive cough.  Patient found to be in COPD exacerbation.  Started on prednisone 40 mg daily.  DuoNeb every 6 hours.  Vitals:   12/09/21 0400 12/09/21 0540  BP: 105/69 117/78  Pulse: 87 81  Resp: (!) 23 (!) 23  Temp:    SpO2: 97% 94%      A/P  COPD exacerbation -Still has significant tightness in chest, decreased airflow, unable to cough up phlegm -Discontinue prednisone start Solu-Medrol 40 mg IV every 12 hours -Continue DuoNeb every 6 hours -Start Mucinex 1200 mg p.o. twice daily -Start doxycycline 100 mg p.o. twice daily -Tessalon Perles 200 mg 3 times daily as needed for cough  Diabetes mellitus type 2 -Continue Glucotrol, Lantus -Discontinue Farxiga -Continue sliding scale insulin with [facility]

## 2021-12-09 NOTE — Assessment & Plan Note (Addendum)
Chronic.  Continue Farxiga 10 mg daily.  Continue glipizide 10 mg daily.  Add sliding scale.  Patient is on [place] at home.  We will change him over to Lantus supplies in the hospital.  Check A1c.

## 2021-12-09 NOTE — Assessment & Plan Note (Signed)
Chronic.  Patient trying to quit smoking..  Nicotine patch.

## 2021-12-09 NOTE — Assessment & Plan Note (Signed)
Chronic.  Add aspirin 81 mg daily.  Continue Lipitor.  20 mg daily.

## 2021-12-09 NOTE — H&P (Addendum)
History and Physical    Duane Cuevas YEZ:[id number] DOB: [birth year] DOA: 12/08/2021  DOS: the patient was seen and examined on 12/08/2021  PCP: Georgina Quint, MD   Patient coming from: Home  I have personally briefly reviewed patient's old medical records in [facility]  Chief complaint: Shortness of breath, cough x1 week History present illness: 66 year old Hispanic male with history of COPD, diabetes on insulin, chronic tobacco abuse, hyperlipidemia, history of coronary disease, hypertension, presents the ER today with a 1 week history of cough, shortness of breath, nonproductive cough.  Patient continues to smoke.  He has been unable to get his cough under control.  Is been taking Robitussin.  Denies any fevers but had chills.  His daughter [name] is his interpreter today.  He came to the ER today for evaluation.  On arrival temp 98.7 heart rate 100 blood pressure 118/76 satting 92% on room air.  Patient received IV Solu-Medrol and multiple rounds of albuterol.  He still having significant wheezing.  Chest x-ray shows chronic emphysematous changes without any acute cardiopulmonary disease.  I personally reviewed and interpreted this x-ray.  Due to continued wheezing and unresolved CP exacerbation, [facility] hospitalist contacted for admission.    ED Course: Extensive wheezing on admission.  Given IV Solu-Medrol and multiple albuterol nebs.  Chest x-ray negative for infiltrates.  Review of Systems:  Review of Systems  Constitutional:  Positive for chills. Negative for fever.  HENT: Negative.    Eyes: Negative.   Respiratory:  Positive for cough, shortness of breath and wheezing.   Cardiovascular:  Positive for chest pain.       CP with coughing. No radiation to chest pain.  Gastrointestinal: Negative.   Genitourinary: Negative.   Musculoskeletal: Negative.   Skin: Negative.   Neurological: Negative.   Endo/Heme/Allergies: Negative.   Psychiatric/Behavioral:  Negative.    All other systems reviewed and are negative.  Past Medical History:  Diagnosis Date   Diabetes mellitus (HCC) 10/2014   pt denies being diabetic.    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     Past Surgical History:  Procedure Laterality Date   BIOPSY  09/16/2020   Procedure: BIOPSY;  Surgeon: Tressia Danas, MD;  Location: [facility];  Service: Gastroenterology;;   ESOPHAGOGASTRODUODENOSCOPY N/A 09/23/2015   Procedure: ESOPHAGOGASTRODUODENOSCOPY (EGD);  Surgeon: Meryl Dare, MD;  Location: [facility];  Service: Endoscopy;  Laterality: N/A;   ESOPHAGOGASTRODUODENOSCOPY (EGD) WITH PROPOFOL N/A 09/16/2020   Procedure: ESOPHAGOGASTRODUODENOSCOPY (EGD) WITH PROPOFOL;  Surgeon: Tressia Danas, MD;  Location: [facility];  Service: Gastroenterology;  Laterality: N/A;   FOOT SURGERY     INGUINAL HERNIA REPAIR Left 05/15/2018   Procedure: LEFT INGUINAL HERNIA REPAIR WITH MESH;  Surgeon: Harriette Bouillon, MD;  Location: [facility] OR;  Service: General;  Laterality: Left;   INSERTION OF MESH Left 05/15/2018   Procedure: INSERTION OF MESH;  Surgeon: Harriette Bouillon, MD;  Location: [facility] OR;  Service: General;  Laterality: Left;   UPPER GASTROINTESTINAL ENDOSCOPY       reports that he has quit smoking. His smoking use included cigarettes. He has a 7.05 pack-year smoking history. He has never used smokeless tobacco. He reports current alcohol use. He reports that he does not use drugs.  No Known Allergies  Family History  Problem Relation Age of Onset   Emphysema Mother    Diabetes Mother  Emphysema Father    Cancer Sister        Stomach cancer   Diabetes Sister    Stomach cancer Sister    Diabetes Brother    Colon cancer Neg Hx    Colon polyps Neg Hx    Esophageal cancer Neg Hx    Rectal cancer Neg Hx     Prior to Admission medications   Medication Sig Start Date End Date  Taking? Authorizing Provider  albuterol (PROVENTIL) (2.5 MG/3ML) 0.083% nebulizer solution USE 1 VIAL VIA NEBULIZER EVERY 6 HOURS AS NEEDED FOR WHEEZING OR SHORTNESS OF BREATH Patient taking differently: Take 2.5 mg by nebulization every 6 (six) hours as needed for shortness of breath. USE 1 VIAL VIA NEBULIZER EVERY 6 HOURS AS NEEDED FOR WHEEZING OR SHORTNESS OF BREATH 05/24/21  Yes Sagardia, Ines Bloomer, MD  albuterol (VENTOLIN HFA) 108 (90 Base) MCG/ACT inhaler Inhale 2 puffs into the lungs every 6 (six) hours as needed for wheezing or shortness of breath. 04/15/21  Yes Sagardia, Ines Bloomer, MD  amLODipine (NORVASC) 5 MG tablet TOMAR 1 TABLETA POR VAI ORAL UNA VEZ AL DIA Patient taking differently: Take 5 mg by mouth daily. 10/13/21  Yes Sagardia, Ines Bloomer, MD  atorvastatin (LIPITOR) 20 MG tablet TAKE ONE TABLET BY MOUTH ONE TIME DAILY Patient taking differently: Take 20 mg by mouth daily. 08/09/21  Yes Sagardia, Ines Bloomer, MD  Budeson-Glycopyrrol-Formoterol (BREZTRI AEROSPHERE) 160-9-4.8 MCG/ACT AERO Inhale 2 puffs into the lungs 2 (two) times daily. Patient taking differently: Inhale 2 puffs into the lungs daily as needed (for shortness of breath). 07/22/21  Yes Sagardia, Ines Bloomer, MD  Dulaglutide (TRULICITY) 1.5 UL/8.4TX SOPN Inject 1.5 mg into the skin once a week. Patient taking differently: Inject 1.5 mg into the skin once a week. Thursday 08/10/21  Yes Sagardia, Ines Bloomer, MD  FARXIGA 10 MG TABS tablet Take 10 mg by mouth daily. 11/09/21  Yes [provider]  glipiZIDE (GLUCOTROL) 10 MG tablet Take 10 mg by mouth daily before breakfast. May take 1 additional tablet if blood glucose is 180 to 200 or higher   Yes [provider]  naproxen (NAPROSYN) 375 MG tablet Take 1 tablet (375 mg total) by mouth 2 (two) times daily. Patient taking differently: Take 375 mg by mouth daily as needed for mild pain. 10/19/21  Yes Dorie Rank, MD  pantoprazole (PROTONIX) 40 MG tablet Take 1  tablet (40 mg total) by mouth daily. 03/24/21 12/09/22 Yes Sagardia, Ines Bloomer, MD  TRESIBA FLEXTOUCH 100 UNIT/ML FlexTouch Pen Inject 25 Units into the skin daily. 10/13/21  Yes [provider]  Ubrogepant (UBRELVY) 100 MG TABS Take 1 tablet by mouth daily. Patient taking differently: Take 1 tablet by mouth as needed (for migraines). 05/26/21  Yes Janith Lima, MD  blood glucose meter kit and supplies KIT Dispense based on patient and insurance preference. Use up to four times daily as directed. 10/22/20   [name], MD  blood glucose meter kit and supplies Dispense based on patient and insurance preference. Use up to four times daily as directed. (FOR ICD-10 E10.9, E11.9). 10/24/20   [name], MD  cyclobenzaprine (FLEXERIL) 10 MG tablet Take 1 tablet (10 mg total) by mouth 2 (two) times daily as needed for muscle spasms. Patient not taking: Reported on 12/08/2021 10/19/21   Dorie Rank, MD  glucose blood (ACCU-CHEK GUIDE) test strip Use as instructed 10/22/20   [name], MD  Insulin Pen Needle (B-D ULTRAFINE  III SHORT PEN) 31G X 8 MM MISC USAR PARA INYECTAR INSULINA 10/13/21   [name], MD  Insulin Pen Needle (B-D ULTRAFINE III SHORT PEN) 31G X 8 MM MISC USAR PARA INYECTAR INSULINA 10/13/21   [name], MD    Physical Exam: Vitals:   12/08/21 2115 12/08/21 2130 12/08/21 2145 12/08/21 2324  BP: 119/81 118/84 114/85 (!) 134/119  Pulse: 84 85 86 81  Resp: (!) $RemoveB'21 17 19 12  '[id number]$ Temp:      TempSrc:      SpO2: 92% 93% 96% 98%  Weight:      Height:        Physical Exam Vitals and nursing note reviewed.  Constitutional:      General: He is not in acute distress.    Appearance: Normal appearance. He is normal weight. He is not ill-appearing, toxic-appearing or diaphoretic.  HENT:     Head: Normocephalic and atraumatic.     Nose: Nose normal. No rhinorrhea.  Eyes:     General: No scleral icterus. Cardiovascular:     Rate and  Rhythm: Normal rate and regular rhythm.     Pulses: Normal pulses.  Pulmonary:     Breath sounds: Examination of the right-upper field reveals wheezing. Examination of the left-upper field reveals wheezing. Examination of the right-middle field reveals wheezing. Examination of the left-middle field reveals wheezing. Examination of the right-lower field reveals wheezing. Examination of the left-lower field reveals wheezing. Wheezing present.  Abdominal:     General: Abdomen is flat. Bowel sounds are normal. There is no distension.     Palpations: Abdomen is soft.     Tenderness: There is no abdominal tenderness. There is no guarding or rebound.  Musculoskeletal:     Right lower leg: No edema.     Left lower leg: No edema.  Skin:    General: Skin is warm and dry.     Capillary Refill: Capillary refill takes less than 2 seconds.  Neurological:     General: No focal deficit present.     Mental Status: He is alert and oriented to person, place, and time.     Labs on Admission: I have personally reviewed following labs and imaging studies  CBC: Recent Labs  Lab 12/08/21 1623  WBC 11.1*  HGB 15.8  HCT 47.7  MCV 91.4  PLT 151   Basic Metabolic Panel: Recent Labs  Lab 12/08/21 1623  NA 144  K 3.2*  CL 110  CO2 25  GLUCOSE 199*  BUN 19  CREATININE 0.90  CALCIUM 9.5  MG 2.1   GFR: Estimated Creatinine Clearance: 78.1 mL/min (by C-G formula based on SCr of 0.9 mg/dL). Liver Function Tests: No results for input(s): AST, ALT, ALKPHOS, BILITOT, PROT, ALBUMIN in the last 168 hours. No results for input(s): LIPASE, AMYLASE in the last 168 hours. No results for input(s): AMMONIA in the last 168 hours. Coagulation Profile: No results for input(s): INR, PROTIME in the last 168 hours. Cardiac Enzymes: Recent Labs  Lab 12/08/21 1623 12/08/21 2039  TROPONINIHS 4 5   BNP (last 3 results) No results for input(s): PROBNP in the last 8760 hours. HbA1C: No results for input(s):  HGBA1C in the last 72 hours. CBG: No results for input(s): GLUCAP in the last 168 hours. Lipid Profile: No results for input(s): CHOL, HDL, LDLCALC, TRIG, CHOLHDL, LDLDIRECT in the last 72 hours. Thyroid Function Tests: No results for input(s): TSH, T4TOTAL, FREET4, T3FREE, THYROIDAB in the last 72 hours. Anemia  Panel: No results for input(s): VITAMINB12, FOLATE, FERRITIN, TIBC, IRON, RETICCTPCT in the last 72 hours. Urine analysis:    Component Value Date/Time   COLORURINE STRAW (A) 09/13/2020 2130   APPEARANCEUR CLEAR 09/13/2020 2130   LABSPEC 1.022 09/13/2020 2130   PHURINE 5.0 09/13/2020 2130   GLUCOSEU >=500 (A) 09/13/2020 2130   HGBUR NEGATIVE 09/13/2020 2130   BILIRUBINUR NEGATIVE 09/13/2020 2130   KETONESUR 5 (A) 09/13/2020 2130   PROTEINUR NEGATIVE 09/13/2020 2130   UROBILINOGEN 0.2 10/14/2014 1712   NITRITE NEGATIVE 09/13/2020 2130   LEUKOCYTESUR NEGATIVE 09/13/2020 2130    Radiological Exams on Admission: I have personally reviewed images DG Chest 2 View  Result Date: 12/08/2021 CLINICAL DATA:  Chest pain and shortness of breath. Cough for 2 weeks. EXAM: CHEST - 2 VIEW COMPARISON:  Chest two views 08/06/2021, 04/14/2021; CT chest 10/19/2021 FINDINGS: Cardiac silhouette and mediastinal contours are within normal limits. Mild calcification within the aortic arch. Mild to moderate hyperinflation. Increased lucencies within the lungs consistent with the centrilobular and paraseptal emphysematous changes seen on prior CT coronal greatest superiorly. Unchanged left lower lung calcified benign granuloma. No focal airspace opacity to indicate pneumonia. No pleural effusion or pneumothorax. No acute skeletal abnormality. IMPRESSION: Chronic emphysematous changes.  No active cardiopulmonary disease. Electronically Signed   By: Yvonne Kendall M.D.   On: 12/08/2021 17:02    EKG: My personal interpretation of EKG shows: NSR    Assessment/Plan Principal Problem:   COPD exacerbation  (HCC) Active Problems:   Uncontrolled type 2 diabetes mellitus with hyperglycemia (HCC)   Dyslipidemia associated with type 2 diabetes mellitus (HCC)   Cigarette smoker   CAD (coronary artery disease)   Hypertension associated with diabetes ([place])    Assessment and Plan: * COPD exacerbation ([place]) Admit to observation telemetry bed.  Continue prednisone 40 mg daily.  DuoNebs every 6 hours.  Patient denies any productive cough.  Hold antibiotics for now.  COVID test is pending.  COPD exacerbation ([place]) is a Acute illness/condition that poses a threat to life or bodily function.   Uncontrolled type 2 diabetes mellitus with hyperglycemia (HCC) Chronic.  Continue Farxiga 10 mg daily.  Continue glipizide 10 mg daily.  Add sliding scale.  Patient is on [place] at home.  We will change him over to [place] supplies in the hospital.  Check A1c.  Hypertension associated with diabetes ([place]) Chronic.  Continue Norvasc 5 mg daily.  Unclear why he is not on an ACE inhibitor given his diabetes.  CAD (coronary artery disease) Chronic.  Add aspirin 81 mg daily.  Continue Lipitor.  20 mg daily.  Cigarette smoker Chronic.  Patient trying to quit smoking..  Nicotine patch.  Dyslipidemia associated with type 2 diabetes mellitus (HCC) Chronic.  Continue Lipitor 20 mg daily    DVT prophylaxis: SQ Heparin Code Status: Full Code Family Communication: discussed with pt and dtr angelica at bedside  Disposition Plan: return home  Consults called: none  Admission status: Observation, Telemetry bed   Kristopher Oppenheim, DO [facility] 12/09/2021, 12:29 AM

## 2021-12-09 NOTE — Assessment & Plan Note (Addendum)
Admit to observation telemetry bed.  Continue prednisone 40 mg daily.  DuoNebs every 6 hours.  Patient denies any productive cough.  Hold antibiotics for now.  COVID test is pending.  COPD exacerbation ([place]) is a Acute illness/condition that poses a threat to life or bodily function.

## 2021-12-09 NOTE — Assessment & Plan Note (Signed)
Chronic.  Continue Lipitor 20 mg daily

## 2021-12-09 NOTE — Assessment & Plan Note (Signed)
Chronic.  Continue Norvasc 5 mg daily.  Unclear why he is not on an ACE inhibitor given his diabetes.

## 2021-12-09 NOTE — Subjective & Objective (Signed)
Chief complaint: Shortness of breath, cough x1 week History present illness: 66 year old Hispanic male with history of COPD, diabetes on insulin, chronic tobacco abuse, hyperlipidemia, history of coronary disease, hypertension, presents the ER today with a 1 week history of cough, shortness of breath, nonproductive cough.  Patient continues to smoke.  He has been unable to get his cough under control.  Is been taking Robitussin.  Denies any fevers but had chills.  His daughter [name] is his interpreter today.  He came to the ER today for evaluation.  On arrival temp 98.7 heart rate 100 blood pressure 118/76 satting 92% on room air.  Patient received IV Solu-Medrol and multiple rounds of albuterol.  He still having significant wheezing.  Chest x-ray shows chronic emphysematous changes without any acute cardiopulmonary disease.  I personally reviewed and interpreted this x-ray.  Due to continued wheezing and unresolved CP exacerbation, [facility] hospitalist contacted for admission.

## 2021-12-10 ENCOUNTER — Telehealth: Payer: Self-pay

## 2021-12-10 DIAGNOSIS — J441 Chronic obstructive pulmonary disease with (acute) exacerbation: Secondary | ICD-10-CM | POA: Diagnosis not present

## 2021-12-10 LAB — GLUCOSE, CAPILLARY: Glucose-Capillary: 249 mg/dL — ABNORMAL HIGH (ref 70–99)

## 2021-12-10 MED ORDER — GUAIFENESIN ER 600 MG PO TB12: 600.0000 mg | ORAL_TABLET | Freq: Two times a day (BID) | ORAL | 0 refills | Status: AC

## 2021-12-10 MED ORDER — DOXYCYCLINE HYCLATE 100 MG PO TABS: 100.0000 mg | ORAL_TABLET | Freq: Two times a day (BID) | ORAL | 0 refills | Status: DC

## 2021-12-10 MED ORDER — PREDNISONE 10 MG PO TABS: ORAL_TABLET | ORAL | 0 refills | Status: DC

## 2021-12-10 MED ORDER — BENZONATATE 100 MG PO CAPS
100.0000 mg | ORAL_CAPSULE | Freq: Three times a day (TID) | ORAL | 0 refills | Status: DC | PRN
Start: 2021-12-10 — End: 2021-12-22

## 2021-12-10 NOTE — Telephone Encounter (Signed)
Pt is requesting a refill on: glipiZIDE (GLUCOTROL) 10 MG tablet  Pharmacy: [id number] [address], [place]. AT [address] 11/23/21

## 2021-12-10 NOTE — Telephone Encounter (Signed)
Patient glipizide rx was d/c by provider on 11/23/2021. Patient is IP in the hospital. If this medication was prescribed while inpatient patient will get this medication while hospitalized.

## 2021-12-10 NOTE — Progress Notes (Signed)
Patient will discharge home with family. Belongings were returned.

## 2021-12-10 NOTE — Discharge Summary (Signed)
Physician Discharge Summary   Patient: Duane Cuevas MRN: [id number] DOB: July 27, 1955  Admit date:     12/08/2021  Discharge date: 12/10/21  Discharge Physician: [name]   PCP: [name], MD   Recommendations at discharge:   Follow-up PCP in 1 week  Discharge Diagnoses: Principal Problem:   COPD exacerbation ([place]) Active Problems:   Uncontrolled type 2 diabetes mellitus with hyperglycemia ([place])   Dyslipidemia associated with type 2 diabetes mellitus ([place])   Cigarette smoker   CAD (coronary artery disease)   Hypertension associated with diabetes ([place])   Seizure ([place])  Resolved Problems:   * No resolved hospital problems. *  Hospital Course: 66 year old male with a history of COPD, diabetes mellitus type 2, chronic tobacco abuse, hyperlipidemia, history of CAD, hypertension came to ED with clinically complaints of cough, shortness of breath, nonproductive cough.  Patient found to be in COPD exacerbation.  Started on prednisone 40 mg daily.  DuoNeb every 6 hours.    Assessment and Plan:  COPD exacerbation -Significantly improved with Solu-Medrol, DuoNeb, Mucinex, doxycycline -Not requiring oxygen -We will discharge home on doxycycline 100 mg p.o. twice daily for 4 more days -Prednisone taper for 4 more days -Albuterol nebulizer as needed, continue Breztri  Diabetes mellitus type 2 -Hemoglobin A1c 6.6 -Continue home medications  Hypertension -Blood pressure controlled, continue home regimen  CAD -Continue aspirin, Lipitor  Dyslipidemia -Continue Lipitor       Consultants:  Procedures performed:  Disposition: Home Diet recommendation:  Discharge Diet Orders (From admission, onward)     Start     Ordered   12/10/21 0000  Diet - low sodium heart healthy        12/10/21 0832           Cardiac diet DISCHARGE MEDICATION: Allergies as of 12/10/2021   No Known Allergies      Medication List     STOP taking these medications     naproxen 375 MG tablet Commonly known as: NAPROSYN       TAKE these medications    Accu-Chek Guide test strip Generic drug: glucose blood Use as instructed   albuterol 108 (90 Base) MCG/ACT inhaler Commonly known as: VENTOLIN HFA Inhale 2 puffs into the lungs every 6 (six) hours as needed for wheezing or shortness of breath. What changed: Another medication with the same name was changed. Make sure you understand how and when to take each.   albuterol (2.5 MG/3ML) 0.083% nebulizer solution Commonly known as: PROVENTIL USE 1 VIAL VIA NEBULIZER EVERY 6 HOURS AS NEEDED FOR WHEEZING OR SHORTNESS OF BREATH What changed:  how much to take how to take this when to take this reasons to take this   amLODipine 5 MG tablet Commonly known as: NORVASC TOMAR 1 TABLETA POR VAI ORAL UNA VEZ AL DIA What changed: See the new instructions.   atorvastatin 20 MG tablet Commonly known as: LIPITOR TAKE ONE TABLET BY MOUTH ONE TIME DAILY   B-D ULTRAFINE III SHORT PEN 31G X 8 MM Misc Generic drug: Insulin Pen Needle USAR PARA INYECTAR INSULINA   B-D ULTRAFINE III SHORT PEN 31G X 8 MM Misc Generic drug: Insulin Pen Needle USAR PARA INYECTAR INSULINA   benzonatate 100 MG capsule Commonly known as: Tessalon Perles Take 1 capsule (100 mg total) by mouth 3 (three) times daily as needed for cough.   blood glucose meter kit and supplies Dispense based on patient and insurance preference. Use up to four times daily as  directed. (FOR ICD-10 E10.9, E11.9).   blood glucose meter kit and supplies Kit Dispense based on patient and insurance preference. Use up to four times daily as directed.   Breztri Aerosphere 160-9-4.8 MCG/ACT Aero Generic drug: Budeson-Glycopyrrol-Formoterol Inhale 2 puffs into the lungs 2 (two) times daily. What changed:  when to take this reasons to take this   cyclobenzaprine 10 MG tablet Commonly known as: FLEXERIL Take 1 tablet (10 mg total) by mouth 2 (two)  times daily as needed for muscle spasms.   doxycycline 100 MG tablet Commonly known as: VIBRA-TABS Take 1 tablet (100 mg total) by mouth every 12 (twelve) hours.   Farxiga 10 MG Tabs tablet Generic drug: dapagliflozin propanediol Take 10 mg by mouth daily.   Glucotrol 10 MG tablet Generic drug: glipiZIDE Take 10 mg by mouth daily before breakfast. May take 1 additional tablet if blood glucose is 180 to 200 or higher   guaiFENesin 600 MG 12 hr tablet Commonly known as: Mucinex Take 1 tablet (600 mg total) by mouth 2 (two) times daily for 5 days.   pantoprazole 40 MG tablet Commonly known as: PROTONIX Take 1 tablet (40 mg total) by mouth daily.   predniSONE 10 MG tablet Commonly known as: DELTASONE Prednisone 40 mg po daily x 1 day then Prednisone 30 mg po daily x 1 day then Prednisone 20 mg po daily x 1 day then Prednisone 10 mg daily x 1 day then stop...   Tyler Aas FlexTouch 100 UNIT/ML FlexTouch Pen Generic drug: insulin degludec Inject 25 Units into the skin daily.   Trulicity 1.5 DP/8.2UM Sopn Generic drug: Dulaglutide Inject 1.5 mg into the skin once a week. What changed: additional instructions   Ubrelvy 100 MG Tabs Generic drug: Ubrogepant TOMAR 1 TALBETA POR VIA ORAL UNA VEZ AL DIA What changed: See the new instructions.        Discharge Exam: Filed Weights   12/08/21 1553 12/10/21 0500  Weight: 76.7 kg 72.3 kg   General-appears in no acute distress Heart-S1-S2, regular, no murmur auscultated Lungs-clear to auscultation bilaterally, no wheezing or crackles auscultated Abdomen-soft, nontender, no organomegaly Extremities-no edema in the lower extremities Neuro-alert, oriented x3, no focal deficit noted  Condition at discharge: good  The results of significant diagnostics from this hospitalization (including imaging, microbiology, ancillary and laboratory) are listed below for reference.   Imaging Studies: DG Chest 2 View  Result Date:  12/08/2021 CLINICAL DATA:  Chest pain and shortness of breath. Cough for 2 weeks. EXAM: CHEST - 2 VIEW COMPARISON:  Chest two views 08/06/2021, 04/14/2021; CT chest 10/19/2021 FINDINGS: Cardiac silhouette and mediastinal contours are within normal limits. Mild calcification within the aortic arch. Mild to moderate hyperinflation. Increased lucencies within the lungs consistent with the centrilobular and paraseptal emphysematous changes seen on prior CT coronal greatest superiorly. Unchanged left lower lung calcified benign granuloma. No focal airspace opacity to indicate pneumonia. No pleural effusion or pneumothorax. No acute skeletal abnormality. IMPRESSION: Chronic emphysematous changes.  No active cardiopulmonary disease. Electronically Signed   By: Yvonne Kendall M.D.   On: 12/08/2021 17:02    Microbiology: Results for orders placed or performed during the hospital encounter of 12/08/21  SARS Coronavirus 2 by RT PCR (hospital order, performed in [facility] hospital lab) *cepheid single result test* Anterior Nasal Swab     Status: None   Collection Time: 12/09/21 12:57 AM   Specimen: Anterior Nasal Swab  Result Value Ref Range Status   SARS Coronavirus 2 by RT PCR  NEGATIVE NEGATIVE Final    Comment: (NOTE) SARS-CoV-2 target nucleic acids are NOT DETECTED.  The SARS-CoV-2 RNA is generally detectable in upper and lower respiratory specimens during the acute phase of infection. The lowest concentration of SARS-CoV-2 viral copies this assay can detect is 250 copies / mL. A negative result does not preclude SARS-CoV-2 infection and should not be used as the sole basis for treatment or other patient management decisions.  A negative result may occur with improper specimen collection / handling, submission of specimen other than nasopharyngeal swab, presence of viral mutation(s) within the areas targeted by this assay, and inadequate number of viral copies (<250 copies / mL). A negative result must  be combined with clinical observations, patient history, and epidemiological information.  Fact Sheet for Patients:   https://www.patel.info/  Fact Sheet for Healthcare Providers: https://hall.com/  This test is not yet approved or  cleared by the [place] FDA and has been authorized for detection and/or diagnosis of SARS-CoV-2 by FDA under an Emergency Use Authorization (EUA).  This EUA will remain in effect (meaning this test can be used) for the duration of the COVID-19 declaration under Section 564(b)(1) of the Act, 21 U.S.C. section 360bbb-3(b)(1), unless the authorization is terminated or revoked sooner.  Performed at [facility], [place] [address] [place], [place] [address]     Labs: CBC: Recent Labs  Lab 12/08/21 1623 12/09/21 0528  WBC 11.1* 13.0*  NEUTROABS  --  12.2*  HGB 15.8 15.7  HCT 47.7 47.1  MCV 91.4 91.8  PLT 261 338   Basic Metabolic Panel: Recent Labs  Lab 12/08/21 1623 12/09/21 0528  NA 144 139  K 3.2* 4.3  CL 110 103  CO2 25 25  GLUCOSE 199* 169*  BUN 19 20  CREATININE 0.90 0.91  CALCIUM 9.5 9.4  MG 2.1 3.4*   Liver Function Tests: Recent Labs  Lab 12/09/21 0528  AST 16  ALT 22  ALKPHOS 80  BILITOT 0.7  PROT 7.4  ALBUMIN 3.7   CBG: Recent Labs  Lab 12/09/21 0923 12/09/21 1749 12/09/21 2113 12/10/21 0719  GLUCAP 171* 277* 218* 249*    Discharge time spent: greater than 30 minutes.  Signed: [name], MD [facility] 12/10/2021

## 2021-12-13 ENCOUNTER — Telehealth: Payer: Self-pay

## 2021-12-13 NOTE — Telephone Encounter (Signed)
Transition Care Management Follow-up Telephone Call Date of discharge and from where: [facility] 12-10-21 Dx: COPD exacerbation  How have you been since you were released from the hospital? Doing ok  Any questions or concerns? No  Items Reviewed: Did the pt receive and understand the discharge instructions provided? Yes  Medications obtained and verified? Yes  Other? No  Any new allergies since your discharge? No  Dietary orders reviewed? Yes Do you have support at home? Yes   Home Care and Equipment/Supplies: Were home health services ordered? no If so, what is the name of the agency? na  Has the agency set up a time to come to the patient's home? not applicable Were any new equipment or medical supplies ordered?  No What is the name of the medical supply agency? na Were you able to get the supplies/equipment? not applicable Do you have any questions related to the use of the equipment or supplies? No  Functional Questionnaire: (I = Independent and D = Dependent) ADLs: I  Bathing/Dressing- I  Meal Prep- I  Eating- I  Maintaining continence- I  Transferring/Ambulation- I  Managing Meds- I  Follow up appointments reviewed:  PCP Hospital f/u appt confirmed? Yes  Scheduled to see Dr Mitchel Honour on 12-22-21 @ 140pm. [facility] f/u appt confirmed? No  . Are transportation arrangements needed? No  If their condition worsens, is the pt aware to call PCP or go to the Emergency Dept.? Yes Was the patient provided with contact information for the PCP's office or ED? Yes Was to pt encouraged to call back with questions or concerns? Yes

## 2021-12-17 ENCOUNTER — Telehealth: Payer: Self-pay | Admitting: Emergency Medicine

## 2021-12-17 MED ORDER — GLIPIZIDE 10 MG PO TABS: 10.0000 mg | ORAL_TABLET | Freq: Every day | ORAL | 0 refills | Status: DC

## 2021-12-17 NOTE — Telephone Encounter (Signed)
Sent in small supply should talk to PCP about if this will be needed long term at follow up. Likely blood sugars are high due to the prednisone he is taking right now.

## 2021-12-17 NOTE — Telephone Encounter (Signed)
Duane Cuevas calling to report blood sugar reading.[id number] Stated pt takes all meds except glipiZIDE (GLUCOTROL) 10 MG tablet  Pt needs refill  on glipiZIDE (GLUCOTROL) 10 MG tablet  Pharmacy [id number] [place], [place]. AT [place]

## 2021-12-22 ENCOUNTER — Ambulatory Visit (INDEPENDENT_AMBULATORY_CARE_PROVIDER_SITE_OTHER): Payer: Medicare HMO | Admitting: Emergency Medicine

## 2021-12-22 ENCOUNTER — Encounter: Payer: Self-pay | Admitting: Emergency Medicine

## 2021-12-22 VITALS — BP 110/72 | HR 100 | Temp 98.6°F | Ht 68.0 in | Wt 159.1 lb

## 2021-12-22 DIAGNOSIS — R051 Acute cough: Secondary | ICD-10-CM | POA: Insufficient documentation

## 2021-12-22 DIAGNOSIS — E1165 Type 2 diabetes mellitus with hyperglycemia: Secondary | ICD-10-CM

## 2021-12-22 DIAGNOSIS — J449 Chronic obstructive pulmonary disease, unspecified: Secondary | ICD-10-CM | POA: Diagnosis not present

## 2021-12-22 DIAGNOSIS — I152 Hypertension secondary to endocrine disorders: Secondary | ICD-10-CM | POA: Diagnosis not present

## 2021-12-22 DIAGNOSIS — E1159 Type 2 diabetes mellitus with other circulatory complications: Secondary | ICD-10-CM | POA: Diagnosis not present

## 2021-12-22 DIAGNOSIS — Z09 Encounter for follow-up examination after completed treatment for conditions other than malignant neoplasm: Secondary | ICD-10-CM

## 2021-12-22 DIAGNOSIS — E785 Hyperlipidemia, unspecified: Secondary | ICD-10-CM | POA: Diagnosis not present

## 2021-12-22 DIAGNOSIS — E1169 Type 2 diabetes mellitus with other specified complication: Secondary | ICD-10-CM | POA: Diagnosis not present

## 2021-12-22 MED ORDER — BENZONATATE 100 MG PO CAPS: 100.0000 mg | ORAL_CAPSULE | Freq: Three times a day (TID) | ORAL | 1 refills | Status: DC | PRN

## 2021-12-22 MED ORDER — PREDNISONE 20 MG PO TABS: 40.0000 mg | ORAL_TABLET | Freq: Every day | ORAL | 1 refills | Status: AC

## 2021-12-22 MED ORDER — ACCU-CHEK GUIDE VI STRP: ORAL_STRIP | 11 refills | Status: DC

## 2021-12-22 NOTE — Assessment & Plan Note (Signed)
Continue Breztri 2 inhalations twice a day. Still has some wheezing.  We will restart prednisone 40 mg daily for 5 days.  Finished antibiotics.  No signs or findings of acute infection. Still has occasional cough.  Tessalon helps. Albuterol as needed

## 2021-12-22 NOTE — Assessment & Plan Note (Signed)
Stable.  Diet and nutrition discussed. Continue atorvastatin 20 mg daily. 

## 2021-12-22 NOTE — Progress Notes (Signed)
Duane Cuevas 66 y.o.   Chief Complaint  Patient presents with   Hospitalization Follow-up   Diabetes    Concerns about his high blood sugars     HISTORY OF PRESENT ILLNESS: This is a 66 y.o. male here for follow-up of hospital discharge.  Admitted with COPD exacerbation. Has history of diabetes.  Since starting prednisone sugars went high.  Stopped prednisone 5 days ago and sugars are coming down to normal levels now. Still having some occasional cough.  Using [place] twice a day and albuterol as needed. Hospital discharge summary as follows:  Physician Discharge Summary    Patient: Duane Cuevas MRN: [id number] DOB: 24-Jun-1956  Admit date:     12/08/2021  Discharge date: 12/10/21  Discharge Physician: [name]    PCP: [name], MD    Recommendations at discharge:    Follow-up PCP in 1 week   Discharge Diagnoses: Principal Problem:   COPD exacerbation ([place]) Active Problems:   Uncontrolled type 2 diabetes mellitus with hyperglycemia ([place])   Dyslipidemia associated with type 2 diabetes mellitus ([place])   Cigarette smoker   CAD (coronary artery disease)   Hypertension associated with diabetes ([place])   Seizure ([place])   Resolved Problems:   * No resolved hospital problems. *   Hospital Course: 65 year old male with a history of COPD, diabetes mellitus type 2, chronic tobacco abuse, hyperlipidemia, history of CAD, hypertension came to ED with clinically complaints of cough, shortness of breath, nonproductive cough.  Patient found to be in COPD exacerbation.  Started on prednisone 40 mg daily.  DuoNeb every 6 hours.     Assessment and Plan:   COPD exacerbation -Significantly improved with Solu-Medrol, DuoNeb, Mucinex, doxycycline -Not requiring oxygen -We will discharge home on doxycycline 100 mg p.o. twice daily for 4 more days -Prednisone taper for 4 more days -Albuterol nebulizer as needed, continue Breztri   Diabetes mellitus type 2 -Hemoglobin  A1c 6.6 -Continue home medications   Hypertension -Blood pressure controlled, continue home regimen   CAD -Continue aspirin, Lipitor   Dyslipidemia -Continue Lipitor Diabetes Pertinent negatives for hypoglycemia include no dizziness or headaches. Pertinent negatives for diabetes include no chest pain.     Prior to Admission medications   Medication Sig Start Date End Date Taking? Authorizing Provider  albuterol (PROVENTIL) (2.5 MG/3ML) 0.083% nebulizer solution USE 1 VIAL VIA NEBULIZER EVERY 6 HOURS AS NEEDED FOR WHEEZING OR SHORTNESS OF BREATH Patient taking differently: Take 2.5 mg by nebulization every 6 (six) hours as needed for shortness of breath. USE 1 VIAL VIA NEBULIZER EVERY 6 HOURS AS NEEDED FOR WHEEZING OR SHORTNESS OF BREATH 05/24/21  Yes Keilee Denman, Ines Bloomer, MD  albuterol (VENTOLIN HFA) 108 (90 Base) MCG/ACT inhaler Inhale 2 puffs into the lungs every 6 (six) hours as needed for wheezing or shortness of breath. 04/15/21  Yes Keeli Roberg, Ines Bloomer, MD  amLODipine (NORVASC) 5 MG tablet TOMAR 1 TABLETA POR VAI ORAL UNA VEZ AL DIA Patient taking differently: Take 5 mg by mouth daily. 10/13/21  Yes Lalonnie Shaffer, Ines Bloomer, MD  atorvastatin (LIPITOR) 20 MG tablet TAKE ONE TABLET BY MOUTH ONE TIME DAILY Patient taking differently: Take 20 mg by mouth daily. 08/09/21  Yes Yavier Snider, Ines Bloomer, MD  blood glucose meter kit and supplies KIT Dispense based on patient and insurance preference. Use up to four times daily as directed. 10/22/20  Yes Charli Halle, Ines Bloomer, MD  blood glucose meter kit and supplies Dispense based on patient and insurance preference. Use up  to four times daily as directed. (FOR ICD-10 E10.9, E11.9). 10/24/20  Yes Amaya Blakeman, Ines Bloomer, MD  Budeson-Glycopyrrol-Formoterol (BREZTRI AEROSPHERE) 160-9-4.8 MCG/ACT AERO Inhale 2 puffs into the lungs 2 (two) times daily. Patient taking differently: Inhale 2 puffs into the lungs daily as needed (for shortness of breath).  07/22/21  Yes Myrian Botello, Ines Bloomer, MD  cyclobenzaprine (FLEXERIL) 10 MG tablet Take 1 tablet (10 mg total) by mouth 2 (two) times daily as needed for muscle spasms. 10/19/21  Yes Dorie Rank, MD  doxycycline (VIBRA-TABS) 100 MG tablet Take 1 tablet (100 mg total) by mouth every 12 (twelve) hours. 12/10/21  Yes Lama, Marge Duncans, MD  Dulaglutide (TRULICITY) 1.5 DP/8.2UM SOPN Inject 1.5 mg into the skin once a week. Patient taking differently: Inject 1.5 mg into the skin once a week. Thursday 08/10/21  Yes Ralph Benavidez, Ines Bloomer, MD  FARXIGA 10 MG TABS tablet Take 10 mg by mouth daily. 11/09/21  Yes [provider]  glipiZIDE (GLUCOTROL) 10 MG tablet Take 1 tablet (10 mg total) by mouth daily before breakfast. May take 1 additional tablet if blood glucose is 180 to 200 or higher 12/17/21  Yes Hoyt Koch, MD  Insulin Pen Needle (B-D ULTRAFINE III SHORT PEN) 31G X 8 MM MISC USAR PARA INYECTAR INSULINA 10/13/21  Yes Salvadore Valvano, Ines Bloomer, MD  Insulin Pen Needle (B-D ULTRAFINE III SHORT PEN) 31G X 8 MM MISC USAR PARA INYECTAR INSULINA 10/13/21  Yes Bernardo Brayman, Ines Bloomer, MD  pantoprazole (PROTONIX) 40 MG tablet Take 1 tablet (40 mg total) by mouth daily. 03/24/21 12/09/22 Yes Mora Pedraza, Ines Bloomer, MD  predniSONE (DELTASONE) 20 MG tablet Take 2 tablets (40 mg total) by mouth daily with breakfast for 10 days. 12/22/21 01/01/22 Yes Zenith Lamphier, Ines Bloomer, MD  TRESIBA FLEXTOUCH 100 UNIT/ML FlexTouch Pen Inject 25 Units into the skin daily. 10/13/21  Yes [provider]  UBRELVY 100 MG TABS TOMAR 1 TALBETA POR VIA ORAL UNA VEZ AL DIA 12/09/21  Yes Janith Lima, MD  benzonatate (TESSALON PERLES) 100 MG capsule Take 1 capsule (100 mg total) by mouth 3 (three) times daily as needed for cough. 12/22/21 12/22/22  [name], MD  glucose blood (ACCU-CHEK GUIDE) test strip Use as instructed 12/22/21   [name], MD    No Known Allergies  Patient Active Problem List   Diagnosis Date  Noted   COPD exacerbation ([place]) 12/09/2021   Seizure ([place]) 12/09/2021   Sebaceous cyst 08/10/2021   Neck pain on left side 05/26/2021   Intractable persistent migraine aura without cerebral infarction and with status migrainosus 05/26/2021   Hypertension associated with diabetes ([place]) 09/21/2020   Other dysphagia    Hyperglycemia 09/13/2020   Uncontrolled type 2 diabetes mellitus with hyperglycemia ([place]) 09/13/2020   CAD (coronary artery disease) 01/23/2020   Hyperlipidemia 11/02/2019   History of MI (myocardial infarction) 11/02/2019   Nonspecific chest pain 11/01/2019   Cigarette smoker 08/15/2018   COPD ? GOLD III/ active smoker 08/14/2018   History of diet-controlled diabetes 06/30/2018   COPD (chronic obstructive pulmonary disease) ([place]) 10/14/2014   Dyslipidemia associated with type 2 diabetes mellitus ([place]) 10/14/2014    Past Medical History:  Diagnosis Date   Diabetes mellitus ([place]) 10/2014   pt denies being diabetic.    Emphysema of lung ([place])    Emphysema/COPD ([place]) 10/2014   Hepatic steatosis    Thrombocytopenia ([place]) 09/2015   platelets in 120s.     Past Surgical History:  Procedure Laterality Date  BIOPSY  09/16/2020   Procedure: BIOPSY;  Surgeon: Thornton Park, MD;  Location: [facility];  Service: Gastroenterology;;   ESOPHAGOGASTRODUODENOSCOPY N/A 09/23/2015   Procedure: ESOPHAGOGASTRODUODENOSCOPY (EGD);  Surgeon: Ladene Artist, MD;  Location: [facility];  Service: Endoscopy;  Laterality: N/A;   ESOPHAGOGASTRODUODENOSCOPY (EGD) WITH PROPOFOL N/A 09/16/2020   Procedure: ESOPHAGOGASTRODUODENOSCOPY (EGD) WITH PROPOFOL;  Surgeon: Thornton Park, MD;  Location: [facility];  Service: Gastroenterology;  Laterality: N/A;   FOOT SURGERY     INGUINAL HERNIA REPAIR Left 05/15/2018   Procedure: LEFT INGUINAL HERNIA REPAIR WITH MESH;  Surgeon: Erroll Luna, MD;  Location: [facility];  Service: General;  Laterality: Left;   INSERTION OF MESH Left 05/15/2018    Procedure: INSERTION OF MESH;  Surgeon: Erroll Luna, MD;  Location: [facility];  Service: General;  Laterality: Left;   UPPER GASTROINTESTINAL ENDOSCOPY      Social History   Socioeconomic History   Marital status: Married    Spouse name: Zoraida   Number of children: 6   Years of education: Not on file   Highest education level: Not on file  Occupational History   Occupation: Employed    Employer: [employer]  Tobacco Use   Smoking status: Former    Packs/day: 0.15    Years: 47.00    Total pack years: 7.05    Types: Cigarettes   Smokeless tobacco: Never  Vaping Use   Vaping Use: Never used  Substance and Sexual Activity   Alcohol use: Yes    Comment: occ   Drug use: No   Sexual activity: Never  Other Topics Concern   Not on file  Social History Narrative   Lives with wife.  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   Social Determinants of Health   Financial Resource Strain: High Risk (01/14/2021)   Overall Financial Resource Strain (CARDIA)    Difficulty of Paying Living Expenses: Hard  Food Insecurity: No Food Insecurity (10/13/2021)   Hunger Vital Sign    Worried About Running Out of Food in the Last Year: Never true    Ran Out of Food in the Last Year: Never true  Transportation Needs: No Transportation Needs (10/13/2021)   PRAPARE - Hydrologist (Medical): No    Lack of Transportation (Non-Medical): No  Physical Activity: Sufficiently Active (08/09/2021)   Exercise Vital Sign    Days of Exercise per Week: 5 days    Minutes of Exercise per Session: 30 min  Stress: No Stress Concern Present (08/09/2021)   [place]    Feeling of Stress : Not at all  Social Connections: Moderately Isolated (08/09/2021)   Social Connection and Isolation Panel [NHANES]    Frequency of Communication with Friends and Family: More than three times a week    Frequency of Social  Gatherings with Friends and Family: More than three times a week    Attends Religious Services: Never    Marine scientist or Organizations: No    Attends Archivist Meetings: Never    Marital Status: Married  Human resources officer Violence: Not At Risk (08/09/2021)   Humiliation, Afraid, Rape, and Kick questionnaire    Fear of Current or Ex-Partner: No    Emotionally Abused: No    Physically Abused: No    Sexually Abused: No    Family History  Problem Relation Age of Onset   Emphysema Mother    Diabetes Mother  Emphysema Father    Cancer Sister        Stomach cancer   Diabetes Sister    Stomach cancer Sister    Diabetes Brother    Colon cancer Neg Hx    Colon polyps Neg Hx    Esophageal cancer Neg Hx    Rectal cancer Neg Hx      Review of Systems  Constitutional: Negative.  Negative for chills and fever.  HENT: Negative.  Negative for congestion and sore throat.   Respiratory:  Positive for cough. Negative for sputum production, shortness of breath and wheezing.   Cardiovascular: Negative.  Negative for chest pain and palpitations.  Gastrointestinal: Negative.  Negative for abdominal pain, diarrhea, nausea and vomiting.  Genitourinary: Negative.   Skin: Negative.  Negative for rash.  Neurological: Negative.  Negative for dizziness and headaches.  All other systems reviewed and are negative.  Today's Vitals   12/22/21 1333  BP: 110/72  Pulse: 100  Temp: 98.6 F (37 C)  TempSrc: Oral  SpO2: 93%  Weight: 159 lb 2 oz (72.2 kg)  Height: _0  (1.727 m)   Body mass index is 24.19 kg/m.   Physical Exam Vitals reviewed.  Constitutional:      Appearance: Normal appearance.  HENT:     Head: Normocephalic.     Mouth/Throat:     Mouth: Mucous membranes are moist.     Pharynx: Oropharynx is clear.  Eyes:     Extraocular Movements: Extraocular movements intact.     Conjunctiva/sclera: Conjunctivae normal.     Pupils: Pupils are equal, round, and  reactive to light.  Cardiovascular:     Rate and Rhythm: Normal rate and regular rhythm.     Pulses: Normal pulses.     Heart sounds: Normal heart sounds.  Pulmonary:     Effort: Pulmonary effort is normal.     Breath sounds: Wheezing (Occasional expiratory wheezing) present.  Abdominal:     Palpations: Abdomen is soft.     Tenderness: There is no abdominal tenderness.  Musculoskeletal:     Cervical back: No tenderness.     Right lower leg: No edema.     Left lower leg: No edema.  Lymphadenopathy:     Cervical: No cervical adenopathy.  Skin:    General: Skin is warm and dry.     Capillary Refill: Capillary refill takes less than 2 seconds.  Neurological:     General: No focal deficit present.     Mental Status: He is alert and oriented to person, place, and time.  Psychiatric:        Mood and Affect: Mood normal.        Behavior: Behavior normal.      ASSESSMENT & PLAN: A total of 52 minutes was spent with the patient and counseling/coordination of care regarding preparing for this visit, review of most recent office visit notes, review of most recent hospital discharge charge summary notes, review of multiple chronic medical problems and their management, review of all medications, review of most recent blood work results, review of most recent chest x-ray report, education on nutrition, prognosis, documentation, and need for follow-up.  Problem List Items Addressed This Visit       Cardiovascular and Mediastinum   Hypertension associated with diabetes ([place])    Well-controlled hypertension.  Continue amlodipine 5 mg daily. BP Readings from Last 3 Encounters:  12/22/21 110/72  12/10/21 110/75  11/23/21 126/78  Well-controlled diabetes although he has had corticosteroid induced  hyperglycemia. Lab Results  Component Value Date   HGBA1C 6.6 (H) 12/09/2021  Continue weekly Trulicity 1.5 mg, Farxiga 10 mg daily, glipizide 10 mg in the morning and Tresiba.  Advised to  increase Tresiba to 30 units daily while on prednisone.          Respiratory   COPD (chronic obstructive pulmonary disease) (HCC)    Continue Breztri 2 inhalations twice a day. Still has some wheezing.  We will restart prednisone 40 mg daily for 5 days.  Finished antibiotics.  No signs or findings of acute infection. Still has occasional cough.  Tessalon helps. Albuterol as needed      Relevant Medications   benzonatate (TESSALON PERLES) 100 MG capsule   predniSONE (DELTASONE) 20 MG tablet     Endocrine   Dyslipidemia associated with type 2 diabetes mellitus ([place])    Stable.  Diet and nutrition discussed.  Continue atorvastatin 20 mg daily.      Relevant Medications   glucose blood (ACCU-CHEK GUIDE) test strip     Other   Acute cough   Relevant Medications   benzonatate (TESSALON PERLES) 100 MG capsule   Other Visit Diagnoses     Hospital discharge follow-up    -  Primary   Type 2 diabetes mellitus with hyperglycemia, without long-term current use of insulin (HCC)       Relevant Medications   glucose blood (ACCU-CHEK GUIDE) test strip      Patient Instructions  Chronic Obstructive Pulmonary Disease  Chronic obstructive pulmonary disease (COPD) is a long-term (chronic) lung problem. When you have COPD, it is hard for air to get in and out of your lungs. Usually the condition gets worse over time, and your lungs will never return to normal. There are things you can do to keep yourself as healthy as possible. What are the causes? Smoking. This is the most common cause. Certain genes passed from parent to child (inherited). What increases the risk? Being exposed to secondhand smoke from cigarettes, pipes, or cigars. Being exposed to chemicals and other irritants, such as fumes and dust in the work environment. Having chronic lung conditions or infections. What are the signs or symptoms? Shortness of breath, especially during physical activity. A long-term cough with  a large amount of thick mucus. Sometimes, the cough may not have any mucus (dry cough). Wheezing. Breathing quickly. Skin that looks gray or blue, especially in the fingers, toes, or lips. Feeling tired (fatigue). Weight loss. Chest tightness. Having infections often. Episodes when breathing symptoms become much worse (exacerbations). At the later stages of this disease, you may have swelling in the ankles, feet, or legs. How is this treated? Taking medicines. Quitting smoking, if you smoke. Rehabilitation. This includes steps to make your body work better. It may involve a team of specialists. Doing exercises. Making changes to your diet. Using oxygen. Lung surgery. Lung transplant. Comfort measures (palliative care). Follow these instructions at home: Medicines Take over-the-counter and prescription medicines only as told by your doctor. Talk to your doctor before taking any cough or allergy medicines. You may need to avoid medicines that cause your lungs to be dry. Lifestyle If you smoke, stop smoking. Smoking makes the problem worse. Do not smoke or use any products that contain nicotine or tobacco. If you need help quitting, ask your doctor. Avoid being around things that make your breathing worse. This may include smoke, chemicals, and fumes. Stay active, but remember to rest as well. Learn and use tips on  how to manage stress and control your breathing. Make sure you get enough sleep. Most adults need at least 7 hours of sleep every night. Eat healthy foods. Eat smaller meals more often. Rest before meals. Controlled breathing Learn and use tips on how to control your breathing as told by your doctor. Try: Breathing in (inhaling) through your nose for 1 second. Then, pucker your lips and breath out (exhale) through your lips for 2 seconds. Putting one hand on your belly (abdomen). Breathe in slowly through your nose for 1 second. Your hand on your belly should move out.  Pucker your lips and breathe out slowly through your lips. Your hand on your belly should move in as you breathe out.  Controlled coughing Learn and use controlled coughing to clear mucus from your lungs. Follow these steps: Lean your head a little forward. Breathe in deeply. Try to hold your breath for 3 seconds. Keep your mouth slightly open while coughing 2 times. Spit any mucus out into a tissue. Rest and do the steps again 1 or 2 times as needed. General instructions Make sure you get all the shots (vaccines) that your doctor recommends. Ask your doctor about a flu shot and a pneumonia shot. Use oxygen therapy and pulmonary rehabilitation if told by your doctor. If you need home oxygen therapy, ask your doctor if you should buy a tool to measure your oxygen level (oximeter). Make a COPD action plan with your doctor. This helps you to know what to do if you feel worse than usual. Manage any other conditions you have as told by your doctor. Avoid going outside when it is very hot, cold, or humid. Avoid people who have a sickness you can catch (contagious). Keep all follow-up visits. Contact a doctor if: You cough up more mucus than usual. There is a change in the color or thickness of the mucus. It is harder to breathe than usual. Your breathing is faster than usual. You have trouble sleeping. You need to use your medicines more often than usual. You have trouble doing your normal activities such as getting dressed or walking around the house. Get help right away if: You have shortness of breath while resting. You have shortness of breath that stops you from: Being able to talk. Doing normal activities. Your chest hurts for longer than 5 minutes. Your skin color is more blue than usual. Your pulse oximeter shows that you have low oxygen for longer than 5 minutes. You have a fever. You feel too tired to breathe normally. These symptoms may represent a serious problem that is an  emergency. Do not wait to see if the symptoms will go away. Get medical help right away. Call your local emergency services (911 in the [place] Do not drive yourself to the hospital. Summary Chronic obstructive pulmonary disease (COPD) is a long-term lung problem. The way your lungs work will never return to normal. Usually the condition gets worse over time. There are things you can do to keep yourself as healthy as possible. Take over-the-counter and prescription medicines only as told by your doctor. If you smoke, stop. Smoking makes the problem worse. This information is not intended to replace advice given to you by your health care provider. Make sure you discuss any questions you have with your health care provider. Document Revised: 04/28/2020 Document Reviewed: 04/28/2020 Elsevier Patient Education  [place], MD [facility]

## 2021-12-22 NOTE — Assessment & Plan Note (Signed)
Well-controlled hypertension.  Continue amlodipine 5 mg daily. BP Readings from Last 3 Encounters:  12/22/21 110/72  12/10/21 110/75  11/23/21 126/78  Well-controlled diabetes although he has had corticosteroid induced hyperglycemia. Lab Results  Component Value Date   HGBA1C 6.6 (H) 12/09/2021  Continue weekly Trulicity 1.5 mg, Farxiga 10 mg daily, glipizide 10 mg in the morning and Tresiba.  Advised to increase Tresiba to 30 units daily while on prednisone.

## 2021-12-22 NOTE — Patient Instructions (Signed)
Chronic Obstructive Pulmonary Disease  Chronic obstructive pulmonary disease (COPD) is a long-term (chronic) lung problem. When you have COPD, it is hard for air to get in and out of your lungs. Usually the condition gets worse over time, and your lungs will never return to normal. There are things you can do to keep yourself as healthy as possible. What are the causes? Smoking. This is the most common cause. Certain genes passed from parent to child (inherited). What increases the risk? Being exposed to secondhand smoke from cigarettes, pipes, or cigars. Being exposed to chemicals and other irritants, such as fumes and dust in the work environment. Having chronic lung conditions or infections. What are the signs or symptoms? Shortness of breath, especially during physical activity. A long-term cough with a large amount of thick mucus. Sometimes, the cough may not have any mucus (dry cough). Wheezing. Breathing quickly. Skin that looks gray or blue, especially in the fingers, toes, or lips. Feeling tired (fatigue). Weight loss. Chest tightness. Having infections often. Episodes when breathing symptoms become much worse (exacerbations). At the later stages of this disease, you may have swelling in the ankles, feet, or legs. How is this treated? Taking medicines. Quitting smoking, if you smoke. Rehabilitation. This includes steps to make your body work better. It may involve a team of specialists. Doing exercises. Making changes to your diet. Using oxygen. Lung surgery. Lung transplant. Comfort measures (palliative care). Follow these instructions at home: Medicines Take over-the-counter and prescription medicines only as told by your doctor. Talk to your doctor before taking any cough or allergy medicines. You may need to avoid medicines that cause your lungs to be dry. Lifestyle If you smoke, stop smoking. Smoking makes the problem worse. Do not smoke or use any products that  contain nicotine or tobacco. If you need help quitting, ask your doctor. Avoid being around things that make your breathing worse. This may include smoke, chemicals, and fumes. Stay active, but remember to rest as well. Learn and use tips on how to manage stress and control your breathing. Make sure you get enough sleep. Most adults need at least 7 hours of sleep every night. Eat healthy foods. Eat smaller meals more often. Rest before meals. Controlled breathing Learn and use tips on how to control your breathing as told by your doctor. Try: Breathing in (inhaling) through your nose for 1 second. Then, pucker your lips and breath out (exhale) through your lips for 2 seconds. Putting one hand on your belly (abdomen). Breathe in slowly through your nose for 1 second. Your hand on your belly should move out. Pucker your lips and breathe out slowly through your lips. Your hand on your belly should move in as you breathe out.  Controlled coughing Learn and use controlled coughing to clear mucus from your lungs. Follow these steps: Lean your head a little forward. Breathe in deeply. Try to hold your breath for 3 seconds. Keep your mouth slightly open while coughing 2 times. Spit any mucus out into a tissue. Rest and do the steps again 1 or 2 times as needed. General instructions Make sure you get all the shots (vaccines) that your doctor recommends. Ask your doctor about a flu shot and a pneumonia shot. Use oxygen therapy and pulmonary rehabilitation if told by your doctor. If you need home oxygen therapy, ask your doctor if you should buy a tool to measure your oxygen level (oximeter). Make a COPD action plan with your doctor. This helps you   to know what to do if you feel worse than usual. Manage any other conditions you have as told by your doctor. Avoid going outside when it is very hot, cold, or humid. Avoid people who have a sickness you can catch (contagious). Keep all follow-up  visits. Contact a doctor if: You cough up more mucus than usual. There is a change in the color or thickness of the mucus. It is harder to breathe than usual. Your breathing is faster than usual. You have trouble sleeping. You need to use your medicines more often than usual. You have trouble doing your normal activities such as getting dressed or walking around the house. Get help right away if: You have shortness of breath while resting. You have shortness of breath that stops you from: Being able to talk. Doing normal activities. Your chest hurts for longer than 5 minutes. Your skin color is more blue than usual. Your pulse oximeter shows that you have low oxygen for longer than 5 minutes. You have a fever. You feel too tired to breathe normally. These symptoms may represent a serious problem that is an emergency. Do not wait to see if the symptoms will go away. Get medical help right away. Call your local emergency services (911 in the [place] Do not drive yourself to the hospital. Summary Chronic obstructive pulmonary disease (COPD) is a long-term lung problem. The way your lungs work will never return to normal. Usually the condition gets worse over time. There are things you can do to keep yourself as healthy as possible. Take over-the-counter and prescription medicines only as told by your doctor. If you smoke, stop. Smoking makes the problem worse. This information is not intended to replace advice given to you by your health care provider. Make sure you discuss any questions you have with your health care provider. Document Revised: 04/28/2020 Document Reviewed: 04/28/2020 Elsevier Patient Education  2023 [employer]  

## 2022-01-17 ENCOUNTER — Telehealth: Payer: Medicare HMO

## 2022-01-19 ENCOUNTER — Other Ambulatory Visit: Payer: Self-pay | Admitting: Emergency Medicine

## 2022-01-24 ENCOUNTER — Other Ambulatory Visit: Payer: Self-pay | Admitting: Emergency Medicine

## 2022-01-26 ENCOUNTER — Ambulatory Visit: Payer: Medicare HMO | Admitting: *Deleted

## 2022-01-26 DIAGNOSIS — J449 Chronic obstructive pulmonary disease, unspecified: Secondary | ICD-10-CM

## 2022-01-26 DIAGNOSIS — E1165 Type 2 diabetes mellitus with hyperglycemia: Secondary | ICD-10-CM

## 2022-01-26 NOTE — Chronic Care Management (AMB) (Signed)
 Care Management    RN Visit Note  01/26/2022 Name: Duane Cuevas MRN: [id number] DOB: 06/21/1956  Subjective: Duane Cuevas is a 66 y.o. year old male who is a primary care patient of Sagardia, Miguel Jose, MD. The care management team was consulted for assistance with disease management and care coordination needs.    Engaged with patient by telephone using [employer], "Maria, ID # [id number]" for follow up visit/ RN CM case closure in response to provider referral for case management and/or care coordination services.   Consent to Services:   Mr. Lasala was given information about Care Management services 01/06/21 including:  Care Management services includes personalized support from designated clinical staff supervised by his physician, including individualized plan of care and coordination with other care providers 24/7 contact phone numbers for assistance for urgent and routine care needs. The patient may stop case management services at any time by phone call to the office staff.  Patient agreed to services and consent obtained.   Assessment: Review of patient past medical history, allergies, medications, health status, including review of consultants reports, laboratory and other test data, was performed as part of comprehensive evaluation and provision of chronic care management services.   SDOH (Social Determinants of Health) assessments and interventions performed:  SDOH Interventions    Flowsheet Row Most Recent Value  SDOH Interventions   Food Insecurity Interventions Intervention Not Indicated  [continues to deny food insecurity]  Transportation Interventions Intervention Not Indicated  [reports daughter continues to provide transportation]     Care Plan  No Known Allergies  Outpatient Encounter Medications as of 01/26/2022  Medication Sig Note   albuterol (PROVENTIL) (2.5 MG/3ML) 0.083% nebulizer solution USE 1 VIAL VIA NEBULIZER EVERY 6  HOURS AS NEEDED FOR WHEEZING OR SHORTNESS OF BREATH (Patient taking differently: Take 2.5 mg by nebulization every 6 (six) hours as needed for shortness of breath. USE 1 VIAL VIA NEBULIZER EVERY 6 HOURS AS NEEDED FOR WHEEZING OR SHORTNESS OF BREATH)    albuterol (VENTOLIN HFA) 108 (90 Base) MCG/ACT inhaler Inhale 2 puffs into the lungs every 6 (six) hours as needed for wheezing or shortness of breath.    amLODipine (NORVASC) 5 MG tablet TOMAR 1 TABLETA POR VAI ORAL UNA VEZ AL DIA (Patient taking differently: Take 5 mg by mouth daily.)    atorvastatin (LIPITOR) 20 MG tablet TAKE ONE TABLET BY MOUTH ONE TIME DAILY (Patient taking differently: Take 20 mg by mouth daily.)    benzonatate (TESSALON PERLES) 100 MG capsule Take 1 capsule (100 mg total) by mouth 3 (three) times daily as needed for cough.    blood glucose meter kit and supplies KIT Dispense based on patient and insurance preference. Use up to four times daily as directed. 12/22/2020: use   blood glucose meter kit and supplies Dispense based on patient and insurance preference. Use up to four times daily as directed. (FOR ICD-10 E10.9, E11.9). 12/22/2020: use   Budeson-Glycopyrrol-Formoterol (BREZTRI AEROSPHERE) 160-9-4.8 MCG/ACT AERO Inhale 2 puffs into the lungs 2 (two) times daily. (Patient taking differently: Inhale 2 puffs into the lungs daily as needed (for shortness of breath).)    cyclobenzaprine (FLEXERIL) 10 MG tablet Take 1 tablet (10 mg total) by mouth 2 (two) times daily as needed for muscle spasms.    doxycycline (VIBRA-TABS) 100 MG tablet Take 1 tablet (100 mg total) by mouth every 12 (twelve) hours.    Dulaglutide (TRULICITY) 1.5 MG/0.5ML SOPN Inject 1.5 mg into the skin once a week. (  Patient taking differently: Inject 1.5 mg into the skin once a week. Thursday)    FARXIGA 10 MG TABS tablet Take 10 mg by mouth daily.    glipiZIDE (GLUCOTROL) 10 MG tablet Take 1 tablet (10 mg total) by mouth daily before breakfast. May take 1  additional tablet if blood glucose is 180 to 200 or higher    glucose blood (ACCU-CHEK GUIDE) test strip Use as instructed    Insulin Pen Needle (B-D ULTRAFINE III SHORT PEN) 31G X 8 MM MISC USAR PARA INYECTAR INSULINA    Insulin Pen Needle (B-D ULTRAFINE III SHORT PEN) 31G X 8 MM MISC USAR PARA INYECTAR INSULINA    pantoprazole (PROTONIX) 40 MG tablet TOMAR 1 TABLETA POR VIA ORAL UNA VEZ AL DIA    TRESIBA FLEXTOUCH 100 UNIT/ML FlexTouch Pen Inject 25 Units into the skin daily.    UBRELVY 100 MG TABS TOMAR 1 TALBETA POR VIA ORAL UNA VEZ AL DIA    Facility-Administered Encounter Medications as of 01/26/2022  Medication   morphine 2 MG/ML injection   Patient Active Problem List   Diagnosis Date Noted   Acute cough 12/22/2021   COPD exacerbation (HCC) 12/09/2021   Seizure (HCC) 12/09/2021   Sebaceous cyst 08/10/2021   Intractable persistent migraine aura without cerebral infarction and with status migrainosus 05/26/2021   Hypertension associated with diabetes (HCC) 09/21/2020   Other dysphagia    Uncontrolled type 2 diabetes mellitus with hyperglycemia (HCC) 09/13/2020   CAD (coronary artery disease) 01/23/2020   Hyperlipidemia 11/02/2019   History of MI (myocardial infarction) 11/02/2019   Cigarette smoker 08/15/2018   COPD ? GOLD III/ active smoker 08/14/2018   History of diet-controlled diabetes 06/30/2018   COPD (chronic obstructive pulmonary disease) (HCC) 10/14/2014   Dyslipidemia associated with type 2 diabetes mellitus (HCC) 10/14/2014   Conditions to be addressed/monitored: COPD and DMII  Care Plan : RN Care Manager Plan of Care  Updates made by Tousey, Laine M, RN since 01/26/2022 12:00 AM     Problem: Chronic Disease Management Needs   Priority: High     Long-Range Goal: Ongoing adherence/ progression of established plan of care for long term chronic disease management   Start Date: 06/30/2021  Expected End Date: 06/30/2022  Priority: High  Note:   Current  Barriers:  Chronic Disease Management support and education needs related to COPD and DMII Continues to smoke "1-2" cigarettes per day" Financial Constraints: reports difficulty affording food, medications, paying for utilities: CCM Community Resource Guide referral placed: confirmed 02/24/21 that Community resource care guide has spoken with patient; 06/30/21- confirmed CCM Pharmacy team active/ involved in patient's ongoing chronic disease care needs 10/13/21: confirmed no ongoing SDOH needs; CCM Pharmacy team remains active Language barrier with limited health literacy: requires interpreter services and frequent repetition/ reinforcement-- 02/24/21: confirmed with patient that his daughter is no longer working: reports okay to contact English-speaking daughter [name], on [facility] DPR as needed; he also tells me on 09/07/21 that his daughter reviews [employer] regularly and is bi-lingual for Spanish/ English: he confirms no need to translate AVS into Spanish language: daughter reviews AVS with patient and understands how to speak/ read English 10/13/21: patient requests translation of AVS into Spanish; requests mailed copy of AVS Unable to independently verbalize appropriate diet and self-health management strategies in setting of DM/ COPD Recent hospitalization June 7-9, 2023 for COPD exacerbation: discharged to home/ self-care without home health services  RNCM Clinical Goal(s):  Patient will demonstrate improved health management independence as   evidenced by adherence to established plan of care for self-health management of DMII/ COPD        through collaboration with RN Care manager, provider, and care team.   Interventions: 1:1 collaboration with primary care provider regarding development and update of comprehensive plan of care as evidenced by provider attestation and co-signature Inter-disciplinary care team collaboration (see longitudinal plan of care) Evaluation of current treatment plan related  to  self management and patient's adherence to plan as established by provider Review of patient status, including review of consultants reports, relevant laboratory and other test results, and medications completed SDOH updated: no new/ unmet concerns identified Pain assessment updated: denies pain today Falls assessment updated: continues to deny new/ recent falls x 12 months- not currently using assistive devices regularly, but has cane and uses as/if indicated;  positive reinforcement provided with encouragement to continue efforts at fall prevention; previously provided education around fall risks/ prevention reinforced Medications discussed:  reports continues to independently self-manage and denies current concerns/ issues/ questions around medications; endorses adherence to taking all medications as prescribed Confirms now using symbicort inhaler and rescue inhaler as prescribed: reports using rescue inhaler "once or twice a day" Confirms has and continues using home O2, "on and off throughout the day;" endorses "only have to use for a few minutes every day, but it helps" Reports he has been unable to obtain glipizide re-fill: instructed to contact his outpatient pharmacy to request re-fill; he will do; I will also message PCP as FYI; from review of EHR, it appears the prescription has been called in to his stated outpatient pharmacy  Reviewed recent hospitalization and post-hospital PCP office visit on 12/22/21: he reports "doing much better now, no longer having trouble with my breathing; I am fine;" verbalizes good understanding of post-PCP office visit instructions and denies questions Reviewed upcoming scheduled provider appointments: none noted; patient was encouraged to follow up with PCP promptly should he develop early signs of breathing difficulty and he verbalizes agreement/ understanding of same Discussed plans with patient for ongoing care management follow up- patient denies current  care coordination/ care management needs and is agreeable to CCM RN CM case closure today; verbalizes understanding to contact PCP or other care providers for any needs that arise in the future, and confirms he has contact information for all care providers     COPD: (Status: 01/26/22: Goal Met.) Long Term Goal  Advised patient to track and manage COPD triggers Provided instruction about proper use of medications used for management of COPD including inhalers Advised patient to self assesses COPD action plan zone and make appointment with provider if in the yellow zone for 48 hours without improvement Advised patient to engage in light exercise as tolerated 3-5 days a week to aid in the the management of COPD Discussed the importance of adequate rest and management of fatigue with COPD Assessed social determinant of health barriers Confirmed patient continues using inhalers: denies needs/ questions about inhalers and verbalizes much better understanding of difference between rescue and maintenance inhalers and when/ how to use reports he believes he is "breathing just fine today" Confirmed patient continues to use home O2 as indicated/ needed: reports currently using "every now and then throughout every day but only for short periods of time"  Diabetes:  (Status: 01/26/22: Goal Met.) Long Term Goal   Lab Results  Component Value Date   HGBA1C 6.6 (H) 12/09/2021  Reviewed prescribed diet with patient heart healthy, low salt, carbohydrate modified, low sugar;   Counseled on importance of regular laboratory monitoring as prescribed;        Confirmed patient continues monitoring blood sugars at home, "usually 2 times a day;" fasting and "before bed": reports consistent recent fasting values between 90-135; post-prandial values between 120- 130; reports occasional low blood sugars < 80, none < 70; reports fasting value this morning of "78" and states "I felt fine-- I felt normal" Assessed patient's  understanding of meaning/ significance of A1-C values: good baseline understanding of same- Reviewed individual historical A1-C trends and provided education around correlation of A1-C value to blood sugar levels at home over 3 months-- provided praise and encouragement for his recent decreased a1-C result; positive reinforcement provided with encouragement to continue efforts Confirmed no signs/ symptoms low blood sugar at home: reinforced previously provided education around low blood sugar signs/ symptoms and corresponding action plan Encouraged patient to continue ongoing efforts to make small changes to dietary habits/ routines, including portion control: he continues to report that due to his cultural preferences/ habits that he does not follow any strict diet     Plan:  No further follow up required: patient denies current care coordination/ care management needs and is agreeable to RN CM case closure today; case closure accordingly     [name], RN, BSN, [place] [phone]: direct office

## 2022-02-09 ENCOUNTER — Other Ambulatory Visit: Payer: Self-pay | Admitting: Internal Medicine

## 2022-02-09 DIAGNOSIS — G43511 Persistent migraine aura without cerebral infarction, intractable, with status migrainosus: Secondary | ICD-10-CM

## 2022-02-21 ENCOUNTER — Other Ambulatory Visit: Payer: Self-pay

## 2022-03-03 ENCOUNTER — Other Ambulatory Visit: Payer: Self-pay | Admitting: *Deleted

## 2022-03-03 MED ORDER — GLIPIZIDE 10 MG PO TABS: 10.0000 mg | ORAL_TABLET | Freq: Every day | ORAL | 1 refills | Status: DC

## 2022-03-14 ENCOUNTER — Other Ambulatory Visit: Payer: Self-pay

## 2022-03-14 ENCOUNTER — Emergency Department (HOSPITAL_COMMUNITY): Payer: Medicare HMO

## 2022-03-14 ENCOUNTER — Encounter (HOSPITAL_COMMUNITY): Payer: Self-pay

## 2022-03-14 ENCOUNTER — Emergency Department (HOSPITAL_COMMUNITY)
Admission: EM | Admit: 2022-03-14 | Discharge: 2022-03-14 | Disposition: A | Discharge status: Home / Self Care | Payer: Medicare HMO | Attending: Emergency Medicine | Admitting: Emergency Medicine

## 2022-03-14 DIAGNOSIS — S0990XA Unspecified injury of head, initial encounter: Secondary | ICD-10-CM | POA: Diagnosis not present

## 2022-03-14 DIAGNOSIS — J449 Chronic obstructive pulmonary disease, unspecified: Secondary | ICD-10-CM | POA: Diagnosis not present

## 2022-03-14 DIAGNOSIS — S199XXA Unspecified injury of neck, initial encounter: Secondary | ICD-10-CM | POA: Diagnosis not present

## 2022-03-14 DIAGNOSIS — W01198A Fall on same level from slipping, tripping and stumbling with subsequent striking against other object, initial encounter: Secondary | ICD-10-CM | POA: Insufficient documentation

## 2022-03-14 DIAGNOSIS — R0789 Other chest pain: Secondary | ICD-10-CM | POA: Diagnosis not present

## 2022-03-14 DIAGNOSIS — G939 Disorder of brain, unspecified: Secondary | ICD-10-CM | POA: Diagnosis not present

## 2022-03-14 DIAGNOSIS — S59902A Unspecified injury of left elbow, initial encounter: Secondary | ICD-10-CM | POA: Insufficient documentation

## 2022-03-14 DIAGNOSIS — R079 Chest pain, unspecified: Secondary | ICD-10-CM | POA: Diagnosis not present

## 2022-03-14 DIAGNOSIS — S4992XA Unspecified injury of left shoulder and upper arm, initial encounter: Secondary | ICD-10-CM | POA: Diagnosis not present

## 2022-03-14 DIAGNOSIS — W19XXXA Unspecified fall, initial encounter: Secondary | ICD-10-CM

## 2022-03-14 DIAGNOSIS — R42 Dizziness and giddiness: Secondary | ICD-10-CM | POA: Diagnosis not present

## 2022-03-14 DIAGNOSIS — Y9301 Activity, walking, marching and hiking: Secondary | ICD-10-CM | POA: Diagnosis not present

## 2022-03-14 DIAGNOSIS — J439 Emphysema, unspecified: Secondary | ICD-10-CM | POA: Diagnosis not present

## 2022-03-14 DIAGNOSIS — Z043 Encounter for examination and observation following other accident: Secondary | ICD-10-CM | POA: Diagnosis not present

## 2022-03-14 DIAGNOSIS — M50323 Other cervical disc degeneration at C6-C7 level: Secondary | ICD-10-CM | POA: Diagnosis not present

## 2022-03-14 MED ORDER — OXYCODONE-ACETAMINOPHEN 5-325 MG PO TABS
1.0000 | ORAL_TABLET | Freq: Once | ORAL | Status: AC
  Administered 2022-03-14: 1 via ORAL
  Filled 2022-03-14: qty 1

## 2022-03-14 MED ORDER — METHOCARBAMOL 500 MG PO TABS: 500.0000 mg | ORAL_TABLET | Freq: Two times a day (BID) | ORAL | 0 refills | Status: DC

## 2022-03-14 MED ORDER — IBUPROFEN 800 MG PO TABS
800.0000 mg | ORAL_TABLET | Freq: Once | ORAL | Status: AC
  Administered 2022-03-14: 800 mg via ORAL
  Filled 2022-03-14: qty 1

## 2022-03-14 NOTE — ED Triage Notes (Signed)
Patient was walking down a ramp from a storage building and slid. Patient hit his head on the ramp, skin tear to the left elbow and left shoulder and chest pain. No LOC, but c/o dizziness. Patient denies taking blood thinners.

## 2022-03-14 NOTE — ED Notes (Signed)
Sling was applied to the left shoulder
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
## 2022-03-14 NOTE — Discharge Instructions (Addendum)
Utilice Tylenol o ibuprofeno para el dolor. Puede utilizar 600 mg de ibuprofeno cada 6 horas o 1000 mg de Tylenol cada 6 horas. Puede optar por [employer] 2. Esto sera ms efectivo. No exceder los 4 g de Tylenol en 24 horas. No exceder los 3200 mg de ibuprofeno las 24 horas.  Puede utilizar el relajante muscular que le estoy recetando adems de lo anterior para ayudar con cualquier dolor irruptivo. [place]. Es seguro tomarlo por la noche, pero sera prudente tomarlo durante el da ya que puede provocar algo de somnolencia. Asegrese de sentirse despierto y alerta antes de ponerse al volante de un automvil o conducir un vehculo motorizado. No es un analgsico narctico, por lo que puede tomarlo si no le produce sueo y aun as conduce un vehculo o maquinaria de forma segura.

## 2022-03-18 ENCOUNTER — Inpatient Hospital Stay (HOSPITAL_COMMUNITY)
Admission: EM | Admit: 2022-03-18 | Discharge: 2022-03-22 | DRG: 419 | Disposition: A | Discharge status: Home / Self Care | Payer: Medicare HMO | Attending: Internal Medicine | Admitting: Internal Medicine

## 2022-03-18 ENCOUNTER — Emergency Department (HOSPITAL_COMMUNITY): Payer: Medicare HMO

## 2022-03-18 ENCOUNTER — Other Ambulatory Visit: Payer: Self-pay

## 2022-03-18 ENCOUNTER — Encounter (HOSPITAL_COMMUNITY): Payer: Self-pay

## 2022-03-18 DIAGNOSIS — I152 Hypertension secondary to endocrine disorders: Secondary | ICD-10-CM

## 2022-03-18 DIAGNOSIS — K831 Obstruction of bile duct: Secondary | ICD-10-CM

## 2022-03-18 DIAGNOSIS — K8067 Calculus of gallbladder and bile duct with acute and chronic cholecystitis with obstruction: Principal | ICD-10-CM | POA: Diagnosis present

## 2022-03-18 DIAGNOSIS — K219 Gastro-esophageal reflux disease without esophagitis: Secondary | ICD-10-CM | POA: Diagnosis present

## 2022-03-18 DIAGNOSIS — W010XXA Fall on same level from slipping, tripping and stumbling without subsequent striking against object, initial encounter: Secondary | ICD-10-CM | POA: Diagnosis present

## 2022-03-18 DIAGNOSIS — J449 Chronic obstructive pulmonary disease, unspecified: Secondary | ICD-10-CM | POA: Diagnosis present

## 2022-03-18 DIAGNOSIS — W19XXXA Unspecified fall, initial encounter: Secondary | ICD-10-CM

## 2022-03-18 DIAGNOSIS — K8051 Calculus of bile duct without cholangitis or cholecystitis with obstruction: Secondary | ICD-10-CM

## 2022-03-18 DIAGNOSIS — I252 Old myocardial infarction: Secondary | ICD-10-CM

## 2022-03-18 DIAGNOSIS — R072 Precordial pain: Secondary | ICD-10-CM | POA: Diagnosis not present

## 2022-03-18 DIAGNOSIS — E1169 Type 2 diabetes mellitus with other specified complication: Secondary | ICD-10-CM

## 2022-03-18 DIAGNOSIS — R079 Chest pain, unspecified: Secondary | ICD-10-CM | POA: Diagnosis not present

## 2022-03-18 DIAGNOSIS — E119 Type 2 diabetes mellitus without complications: Secondary | ICD-10-CM | POA: Diagnosis present

## 2022-03-18 DIAGNOSIS — I10 Essential (primary) hypertension: Secondary | ICD-10-CM | POA: Diagnosis present

## 2022-03-18 DIAGNOSIS — K5900 Constipation, unspecified: Secondary | ICD-10-CM | POA: Diagnosis not present

## 2022-03-18 DIAGNOSIS — Z7951 Long term (current) use of inhaled steroids: Secondary | ICD-10-CM

## 2022-03-18 DIAGNOSIS — Y92008 Other place in unspecified non-institutional (private) residence as the place of occurrence of the external cause: Secondary | ICD-10-CM

## 2022-03-18 DIAGNOSIS — I272 Pulmonary hypertension, unspecified: Secondary | ICD-10-CM | POA: Diagnosis present

## 2022-03-18 DIAGNOSIS — Y9301 Activity, walking, marching and hiking: Secondary | ICD-10-CM | POA: Diagnosis present

## 2022-03-18 DIAGNOSIS — E785 Hyperlipidemia, unspecified: Secondary | ICD-10-CM | POA: Diagnosis present

## 2022-03-18 DIAGNOSIS — Z833 Family history of diabetes mellitus: Secondary | ICD-10-CM

## 2022-03-18 DIAGNOSIS — Z825 Family history of asthma and other chronic lower respiratory diseases: Secondary | ICD-10-CM

## 2022-03-18 DIAGNOSIS — J439 Emphysema, unspecified: Secondary | ICD-10-CM | POA: Diagnosis present

## 2022-03-18 DIAGNOSIS — R0789 Other chest pain: Secondary | ICD-10-CM | POA: Diagnosis not present

## 2022-03-18 DIAGNOSIS — R1084 Generalized abdominal pain: Principal | ICD-10-CM

## 2022-03-18 DIAGNOSIS — F17210 Nicotine dependence, cigarettes, uncomplicated: Secondary | ICD-10-CM | POA: Diagnosis present

## 2022-03-18 DIAGNOSIS — K810 Acute cholecystitis: Secondary | ICD-10-CM

## 2022-03-18 DIAGNOSIS — K760 Fatty (change of) liver, not elsewhere classified: Secondary | ICD-10-CM | POA: Diagnosis present

## 2022-03-18 DIAGNOSIS — Z794 Long term (current) use of insulin: Secondary | ICD-10-CM

## 2022-03-18 DIAGNOSIS — Z7984 Long term (current) use of oral hypoglycemic drugs: Secondary | ICD-10-CM

## 2022-03-18 DIAGNOSIS — Z800 Family history of malignant neoplasm of digestive organs: Secondary | ICD-10-CM

## 2022-03-18 DIAGNOSIS — S40022A Contusion of left upper arm, initial encounter: Secondary | ICD-10-CM | POA: Diagnosis present

## 2022-03-18 DIAGNOSIS — Z79899 Other long term (current) drug therapy: Secondary | ICD-10-CM

## 2022-03-18 DIAGNOSIS — Z7985 Long-term (current) use of injectable non-insulin antidiabetic drugs: Secondary | ICD-10-CM

## 2022-03-18 DIAGNOSIS — R7989 Other specified abnormal findings of blood chemistry: Secondary | ICD-10-CM

## 2022-03-18 LAB — CBC
HCT: 47 % (ref 39.0–52.0)
Hemoglobin: 15.7 g/dL (ref 13.0–17.0)
MCH: 30.3 pg (ref 26.0–34.0)
MCHC: 33.4 g/dL (ref 30.0–36.0)
MCV: 90.7 fL (ref 80.0–100.0)
Platelets: 279 10*3/uL (ref 150–400)
RBC: 5.18 MIL/uL (ref 4.22–5.81)
RDW: 13.2 % (ref 11.5–15.5)
WBC: 13.3 10*3/uL — ABNORMAL HIGH (ref 4.0–10.5)
nRBC: 0 % (ref 0.0–0.2)

## 2022-03-18 LAB — BASIC METABOLIC PANEL
Anion gap: 9 (ref 5–15)
BUN: 13 mg/dL (ref 8–23)
CO2: 23 mmol/L (ref 22–32)
Calcium: 9.1 mg/dL (ref 8.9–10.3)
Chloride: 103 mmol/L (ref 98–111)
Creatinine, Ser: 0.74 mg/dL (ref 0.61–1.24)
GFR, Estimated: >60 mL/min (ref >60)
Glucose, Bld: 108 mg/dL — ABNORMAL HIGH (ref 70–99)
Potassium: 4 mmol/L (ref 3.5–5.1)
Sodium: 135 mmol/L (ref 135–145)

## 2022-03-18 LAB — TROPONIN I (HIGH SENSITIVITY): Troponin I (High Sensitivity): 6 ng/L (ref <18)

## 2022-03-18 NOTE — ED Triage Notes (Signed)
Ambulatory to ED with c/o chest pain s/p fall a few days ago. States chest pain started yesterday, but got worse today. No cardiac hx, only COPD.

## 2022-03-18 NOTE — ED Provider Triage Note (Signed)
Emergency Medicine Provider Triage Evaluation Note  Duane Cuevas , a 67 y.o. male  was evaluated in triage.  Pt complains of chest pain following a fall that occurred 2 days ago.  Slightly worse today.  Reports abdominal pain that started today.  Some shortness of breath.  He was evaluated 2 days ago after the fall.  Had a CT scan of the head and neck done at that time.  No rib fractures noted during that visit on plain film.  Review of Systems  Positive: As above Negative: As above  Physical Exam  BP 116/76   Pulse 89   Temp 98.5 F (36.9 C)   Resp 20   Ht '5\' 8"'$  (1.727 m)   Wt 74.8 kg   SpO2 97%   BMI 25.07 kg/m  Gen:   Awake, no distress   Resp:  Normal effort  MSK:   Moves extremities without difficulty  Other:  Generalized abdominal tenderness, with distention, and guarding.  Chest tenderness to palpation present as well.  Mostly left-sided.  Eisbach tenderness including thoracic and lumbar spine.  Without cervical spinal process tenderness.General Medical Decision Making  Medically screening exam initiated at 8:36 PM.  Appropriate orders placed.  Duane Cuevas was informed that the remainder of the evaluation will be completed by another provider, this initial triage assessment does not replace that evaluation, and the importance of remaining in the ED until their evaluation is complete.     Evlyn Courier, PA-C 03/18/22 2038

## 2022-03-19 ENCOUNTER — Inpatient Hospital Stay (HOSPITAL_COMMUNITY): Payer: Medicare HMO

## 2022-03-19 ENCOUNTER — Emergency Department (HOSPITAL_COMMUNITY): Payer: Medicare HMO

## 2022-03-19 ENCOUNTER — Encounter (HOSPITAL_COMMUNITY): Payer: Self-pay | Admitting: Certified Registered Nurse Anesthetist

## 2022-03-19 ENCOUNTER — Encounter (HOSPITAL_COMMUNITY): Payer: Self-pay

## 2022-03-19 DIAGNOSIS — K8051 Calculus of bile duct without cholangitis or cholecystitis with obstruction: Secondary | ICD-10-CM

## 2022-03-19 DIAGNOSIS — I2510 Atherosclerotic heart disease of native coronary artery without angina pectoris: Secondary | ICD-10-CM | POA: Diagnosis not present

## 2022-03-19 DIAGNOSIS — K8067 Calculus of gallbladder and bile duct with acute and chronic cholecystitis with obstruction: Secondary | ICD-10-CM | POA: Diagnosis not present

## 2022-03-19 DIAGNOSIS — J439 Emphysema, unspecified: Secondary | ICD-10-CM | POA: Diagnosis not present

## 2022-03-19 DIAGNOSIS — R69 Illness, unspecified: Secondary | ICD-10-CM | POA: Diagnosis not present

## 2022-03-19 DIAGNOSIS — Z7985 Long-term (current) use of injectable non-insulin antidiabetic drugs: Secondary | ICD-10-CM | POA: Diagnosis not present

## 2022-03-19 DIAGNOSIS — I10 Essential (primary) hypertension: Secondary | ICD-10-CM | POA: Diagnosis not present

## 2022-03-19 DIAGNOSIS — M545 Low back pain, unspecified: Secondary | ICD-10-CM | POA: Diagnosis not present

## 2022-03-19 DIAGNOSIS — W010XXA Fall on same level from slipping, tripping and stumbling without subsequent striking against object, initial encounter: Secondary | ICD-10-CM | POA: Diagnosis not present

## 2022-03-19 DIAGNOSIS — M40204 Unspecified kyphosis, thoracic region: Secondary | ICD-10-CM | POA: Diagnosis not present

## 2022-03-19 DIAGNOSIS — K219 Gastro-esophageal reflux disease without esophagitis: Secondary | ICD-10-CM | POA: Diagnosis not present

## 2022-03-19 DIAGNOSIS — R7989 Other specified abnormal findings of blood chemistry: Secondary | ICD-10-CM | POA: Insufficient documentation

## 2022-03-19 DIAGNOSIS — Z043 Encounter for examination and observation following other accident: Secondary | ICD-10-CM | POA: Diagnosis not present

## 2022-03-19 DIAGNOSIS — K760 Fatty (change of) liver, not elsewhere classified: Secondary | ICD-10-CM | POA: Diagnosis not present

## 2022-03-19 DIAGNOSIS — Z9049 Acquired absence of other specified parts of digestive tract: Secondary | ICD-10-CM | POA: Diagnosis not present

## 2022-03-19 DIAGNOSIS — K8000 Calculus of gallbladder with acute cholecystitis without obstruction: Secondary | ICD-10-CM | POA: Diagnosis not present

## 2022-03-19 DIAGNOSIS — Z794 Long term (current) use of insulin: Secondary | ICD-10-CM | POA: Diagnosis not present

## 2022-03-19 DIAGNOSIS — E119 Type 2 diabetes mellitus without complications: Secondary | ICD-10-CM | POA: Diagnosis not present

## 2022-03-19 DIAGNOSIS — Z79899 Other long term (current) drug therapy: Secondary | ICD-10-CM | POA: Diagnosis not present

## 2022-03-19 DIAGNOSIS — K5900 Constipation, unspecified: Secondary | ICD-10-CM | POA: Diagnosis not present

## 2022-03-19 DIAGNOSIS — R079 Chest pain, unspecified: Secondary | ICD-10-CM | POA: Diagnosis not present

## 2022-03-19 DIAGNOSIS — K810 Acute cholecystitis: Secondary | ICD-10-CM | POA: Diagnosis not present

## 2022-03-19 DIAGNOSIS — Z7984 Long term (current) use of oral hypoglycemic drugs: Secondary | ICD-10-CM | POA: Diagnosis not present

## 2022-03-19 DIAGNOSIS — R935 Abnormal findings on diagnostic imaging of other abdominal regions, including retroperitoneum: Secondary | ICD-10-CM | POA: Diagnosis not present

## 2022-03-19 DIAGNOSIS — Y9301 Activity, walking, marching and hiking: Secondary | ICD-10-CM | POA: Diagnosis not present

## 2022-03-19 DIAGNOSIS — Z825 Family history of asthma and other chronic lower respiratory diseases: Secondary | ICD-10-CM | POA: Diagnosis not present

## 2022-03-19 DIAGNOSIS — Z7951 Long term (current) use of inhaled steroids: Secondary | ICD-10-CM | POA: Diagnosis not present

## 2022-03-19 DIAGNOSIS — K831 Obstruction of bile duct: Secondary | ICD-10-CM | POA: Diagnosis not present

## 2022-03-19 DIAGNOSIS — I252 Old myocardial infarction: Secondary | ICD-10-CM | POA: Diagnosis not present

## 2022-03-19 DIAGNOSIS — Z87891 Personal history of nicotine dependence: Secondary | ICD-10-CM | POA: Diagnosis not present

## 2022-03-19 DIAGNOSIS — N3289 Other specified disorders of bladder: Secondary | ICD-10-CM | POA: Diagnosis not present

## 2022-03-19 DIAGNOSIS — Y92008 Other place in unspecified non-institutional (private) residence as the place of occurrence of the external cause: Secondary | ICD-10-CM | POA: Diagnosis not present

## 2022-03-19 DIAGNOSIS — W19XXXA Unspecified fall, initial encounter: Secondary | ICD-10-CM

## 2022-03-19 DIAGNOSIS — F17210 Nicotine dependence, cigarettes, uncomplicated: Secondary | ICD-10-CM | POA: Diagnosis not present

## 2022-03-19 DIAGNOSIS — S40022A Contusion of left upper arm, initial encounter: Secondary | ICD-10-CM | POA: Diagnosis not present

## 2022-03-19 DIAGNOSIS — N281 Cyst of kidney, acquired: Secondary | ICD-10-CM | POA: Diagnosis not present

## 2022-03-19 DIAGNOSIS — R1013 Epigastric pain: Secondary | ICD-10-CM | POA: Diagnosis not present

## 2022-03-19 DIAGNOSIS — K838 Other specified diseases of biliary tract: Secondary | ICD-10-CM | POA: Diagnosis not present

## 2022-03-19 DIAGNOSIS — E785 Hyperlipidemia, unspecified: Secondary | ICD-10-CM | POA: Diagnosis not present

## 2022-03-19 DIAGNOSIS — Z800 Family history of malignant neoplasm of digestive organs: Secondary | ICD-10-CM | POA: Diagnosis not present

## 2022-03-19 DIAGNOSIS — I272 Pulmonary hypertension, unspecified: Secondary | ICD-10-CM | POA: Diagnosis not present

## 2022-03-19 DIAGNOSIS — K82A1 Gangrene of gallbladder in cholecystitis: Secondary | ICD-10-CM | POA: Diagnosis not present

## 2022-03-19 DIAGNOSIS — R072 Precordial pain: Secondary | ICD-10-CM | POA: Diagnosis not present

## 2022-03-19 DIAGNOSIS — K5730 Diverticulosis of large intestine without perforation or abscess without bleeding: Secondary | ICD-10-CM | POA: Diagnosis not present

## 2022-03-19 DIAGNOSIS — K8020 Calculus of gallbladder without cholecystitis without obstruction: Secondary | ICD-10-CM | POA: Diagnosis not present

## 2022-03-19 DIAGNOSIS — Z833 Family history of diabetes mellitus: Secondary | ICD-10-CM | POA: Diagnosis not present

## 2022-03-19 LAB — HEPATIC FUNCTION PANEL
ALT: 120 U/L — ABNORMAL HIGH (ref 0–44)
AST: 144 U/L — ABNORMAL HIGH (ref 15–41)
Albumin: 4 g/dL (ref 3.5–5.0)
Alkaline Phosphatase: 291 U/L — ABNORMAL HIGH (ref 38–126)
Bilirubin, Direct: 1.6 mg/dL — ABNORMAL HIGH (ref 0.0–0.2)
Indirect Bilirubin: 1.4 mg/dL — ABNORMAL HIGH (ref 0.3–0.9)
Total Bilirubin: 3 mg/dL — ABNORMAL HIGH (ref 0.3–1.2)
Total Protein: 7.8 g/dL (ref 6.5–8.1)

## 2022-03-19 LAB — GLUCOSE, CAPILLARY
Glucose-Capillary: 148 mg/dL — ABNORMAL HIGH (ref 70–99)
Glucose-Capillary: 121 mg/dL — ABNORMAL HIGH (ref 70–99)

## 2022-03-19 LAB — LIPASE, BLOOD: Lipase: 29 U/L (ref 11–51)

## 2022-03-19 LAB — TROPONIN I (HIGH SENSITIVITY): Troponin I (High Sensitivity): 8 ng/L (ref <18)

## 2022-03-19 MED ORDER — CIPROFLOXACIN IN D5W 400 MG/200ML IV SOLN
400.0000 mg | Freq: Two times a day (BID) | INTRAVENOUS | Status: DC
  Administered 2022-03-19 – 2022-03-21 (×5): 400 mg via INTRAVENOUS
  Filled 2022-03-19 (×5): qty 200

## 2022-03-19 MED ORDER — IOHEXOL 300 MG/ML  SOLN
100.0000 mL | Freq: Once | INTRAMUSCULAR | Status: AC | PRN
  Administered 2022-03-19: 100 mL via INTRAVENOUS

## 2022-03-19 MED ORDER — INSULIN ASPART 100 UNIT/ML IJ SOLN
0.0000 [IU] | Freq: Three times a day (TID) | INTRAMUSCULAR | Status: DC
  Administered 2022-03-19: 1 [IU] via SUBCUTANEOUS
  Administered 2022-03-20: 3 [IU] via SUBCUTANEOUS
  Administered 2022-03-21 (×2): 1 [IU] via SUBCUTANEOUS

## 2022-03-19 MED ORDER — DICLOFENAC SUPPOSITORY 100 MG: 100.0000 mg | Freq: Once | RECTAL | Status: DC

## 2022-03-19 MED ORDER — SODIUM CHLORIDE 0.9 % IV SOLN: INTRAVENOUS | Status: DC

## 2022-03-19 MED ORDER — MORPHINE SULFATE (PF) 4 MG/ML IV SOLN
4.0000 mg | INTRAVENOUS | Status: DC | PRN
  Administered 2022-03-19 – 2022-03-20 (×4): 4 mg via INTRAVENOUS
  Filled 2022-03-19 (×4): qty 1

## 2022-03-19 MED ORDER — PROCHLORPERAZINE EDISYLATE 10 MG/2ML IJ SOLN: 10.0000 mg | Freq: Four times a day (QID) | INTRAMUSCULAR | Status: DC | PRN

## 2022-03-19 MED ORDER — LACTATED RINGERS IV SOLN: INTRAVENOUS | Status: DC

## 2022-03-19 MED ORDER — ARFORMOTEROL TARTRATE 15 MCG/2ML IN NEBU
15.0000 ug | INHALATION_SOLUTION | Freq: Two times a day (BID) | RESPIRATORY_TRACT | Status: DC
  Administered 2022-03-19 – 2022-03-22 (×6): 15 ug via RESPIRATORY_TRACT
  Filled 2022-03-19 (×6): qty 2

## 2022-03-19 MED ORDER — ALBUTEROL SULFATE (2.5 MG/3ML) 0.083% IN NEBU: 2.5000 mg | INHALATION_SOLUTION | Freq: Four times a day (QID) | RESPIRATORY_TRACT | Status: DC | PRN

## 2022-03-19 MED ORDER — BUDESON-GLYCOPYRROL-FORMOTEROL 160-9-4.8 MCG/ACT IN AERO: 2.0000 | INHALATION_SPRAY | Freq: Two times a day (BID) | RESPIRATORY_TRACT | Status: DC

## 2022-03-19 MED ORDER — UMECLIDINIUM BROMIDE 62.5 MCG/ACT IN AEPB
1.0000 | INHALATION_SPRAY | Freq: Every day | RESPIRATORY_TRACT | Status: DC
  Administered 2022-03-20 – 2022-03-22 (×3): 1 via RESPIRATORY_TRACT
  Filled 2022-03-19: qty 7

## 2022-03-19 MED ORDER — GADOBUTROL 1 MMOL/ML IV SOLN
7.5000 mL | Freq: Once | INTRAVENOUS | Status: AC | PRN
  Administered 2022-03-19: 7.5 mL via INTRAVENOUS

## 2022-03-19 NOTE — Consult Note (Signed)
Reason for Consult:gallstones/cholecystitis elevated LFTs Referring Physician: Henrene Pastor MD   Duane Cuevas is an 66 y.o. male.  HPI: Pleasant 66 yo male 2 day hx epigastric pain 8/10 sharp made better with pain meds but constant   Denies N/V or itching  CT showed gallstones and questionable distal CBD stone  MRCP poor quality and showed a duodenal diverticulum and some sort of mass like lesion? At distal CBD Surgery consulted for the above   Past Medical History:  Diagnosis Date   Diabetes mellitus ([place]) 10/2014   pt denies being diabetic.    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     Past Surgical History:  Procedure Laterality Date   BIOPSY  09/16/2020   Procedure: BIOPSY;  Surgeon: Thornton Park, MD;  Location: [facility];  Service: Gastroenterology;;   ESOPHAGOGASTRODUODENOSCOPY N/A 09/23/2015   Procedure: ESOPHAGOGASTRODUODENOSCOPY (EGD);  Surgeon: Ladene Artist, MD;  Location: [facility];  Service: Endoscopy;  Laterality: N/A;   ESOPHAGOGASTRODUODENOSCOPY (EGD) WITH PROPOFOL N/A 09/16/2020   Procedure: ESOPHAGOGASTRODUODENOSCOPY (EGD) WITH PROPOFOL;  Surgeon: Thornton Park, MD;  Location: [facility];  Service: Gastroenterology;  Laterality: N/A;   FOOT SURGERY     HERNIA REPAIR     INGUINAL HERNIA REPAIR Left 05/15/2018   Procedure: LEFT INGUINAL HERNIA REPAIR WITH MESH;  Surgeon: Erroll Luna, MD;  Location: [facility];  Service: General;  Laterality: Left;   INSERTION OF MESH Left 05/15/2018   Procedure: INSERTION OF MESH;  Surgeon: Erroll Luna, MD;  Location: [facility];  Service: General;  Laterality: Left;   PELVIC FRACTURE SURGERY     UPPER GASTROINTESTINAL ENDOSCOPY      Family History  Problem Relation Age of Onset   Emphysema Mother    Diabetes Mother    Emphysema Father    Cancer Sister        Stomach cancer   Diabetes Sister    Stomach cancer Sister     Diabetes Brother    Colon cancer Neg Hx    Colon polyps Neg Hx    Esophageal cancer Neg Hx    Rectal cancer Neg Hx     Social History:  reports that he has quit smoking. His smoking use included cigarettes. He has a 7.05 pack-year smoking history. He has never used smokeless tobacco. He reports current alcohol use. He reports that he does not use drugs.  Allergies: No Known Allergies  Medications: I have reviewed the patient's current medications.  Results for orders placed or performed during the hospital encounter of 03/18/22 (from the past 48 hour(s))  Basic metabolic panel     Status: Abnormal   Collection Time: 03/18/22 10:06 PM  Result Value Ref Range   Sodium 135 135 - 145 mmol/L   Potassium 4.0 3.5 - 5.1 mmol/L   Chloride 103 98 - 111 mmol/L   CO2 23 22 - 32 mmol/L   Glucose, Bld 108 (H) 70 - 99 mg/dL    Comment: Glucose reference range applies only to samples taken after fasting for at least 8 hours.   BUN 13 8 - 23 mg/dL   Creatinine, Ser 0.74 0.61 - 1.24 mg/dL   Calcium 9.1 8.9 - 10.3 mg/dL   GFR, Estimated >60 >60 mL/min    Comment: (NOTE) Calculated using the CKD-EPI Creatinine Equation (2021)    Anion gap 9 5 - 15    Comment: Performed at [facility]  [place] [address], [place], [place] [address]  CBC     Status: Abnormal   Collection Time: 03/18/22 10:06 PM  Result Value Ref Range   WBC 13.3 (H) 4.0 - 10.5 K/uL   RBC 5.18 4.22 - 5.81 MIL/uL   Hemoglobin 15.7 13.0 - 17.0 g/dL   HCT 47.0 39.0 - 52.0 %   MCV 90.7 80.0 - 100.0 fL   MCH 30.3 26.0 - 34.0 pg   MCHC 33.4 30.0 - 36.0 g/dL   RDW 13.2 11.5 - 15.5 %   Platelets 279 150 - 400 K/uL   nRBC 0.0 0.0 - 0.2 %    Comment: Performed at [facility], [place] [address] [place], [place] [address]  Troponin I (High Sensitivity)     Status: None   Collection Time: 03/18/22 10:06 PM  Result Value Ref Range   Troponin I (High Sensitivity) 6 <18 ng/L    Comment:  (NOTE) Elevated high sensitivity troponin I (hsTnI) values and significant  changes across serial measurements may suggest ACS but many other  chronic and acute conditions are known to elevate hsTnI results.  Refer to the "Links" section for chest pain algorithms and additional  guidance. Performed at [facility], [place] [address], [place], [place] [address]   Troponin I (High Sensitivity)     Status: None   Collection Time: 03/19/22  2:39 AM  Result Value Ref Range   Troponin I (High Sensitivity) 8 <18 ng/L    Comment: (NOTE) Elevated high sensitivity troponin I (hsTnI) values and significant  changes across serial measurements may suggest ACS but many other  chronic and acute conditions are known to elevate hsTnI results.  Refer to the "Links" section for chest pain algorithms and additional  guidance. Performed at [facility], [place] [address], [place], [place] [address]   Hepatic function panel     Status: Abnormal   Collection Time: 03/19/22  2:39 AM  Result Value Ref Range   Total Protein 7.8 6.5 - 8.1 g/dL   Albumin 4.0 3.5 - 5.0 g/dL   AST 144 (H) 15 - 41 U/L   ALT 120 (H) 0 - 44 U/L   Alkaline Phosphatase 291 (H) 38 - 126 U/L   Total Bilirubin 3.0 (H) 0.3 - 1.2 mg/dL   Bilirubin, Direct 1.6 (H) 0.0 - 0.2 mg/dL   Indirect Bilirubin 1.4 (H) 0.3 - 0.9 mg/dL    Comment: Performed at [facility], [place] [address], [place], [place] [address]  Lipase, blood     Status: None   Collection Time: 03/19/22  2:39 AM  Result Value Ref Range   Lipase 29 11 - 51 U/L    Comment: Performed at [facility], [place] [address], [place], [place] [address]    MR ABDOMEN MRCP W WO CONTAST  Result Date: 03/19/2022 CLINICAL DATA:  Acute hepatitis. Elevated LFTs with possible choledocholithiasis. EXAM: MRI ABDOMEN WITHOUT AND WITH CONTRAST (INCLUDING MRCP) TECHNIQUE: Multiplanar multisequence MR imaging of the abdomen was  performed both before and after the administration of intravenous contrast. Heavily T2-weighted images of the biliary and pancreatic ducts were obtained, and three-dimensional MRCP images were rendered by post processing. CONTRAST:  7.1m GADAVIST GADOBUTROL 1 MMOL/ML IV SOLN COMPARISON:  CT from 03/19/2022 FINDINGS: Exam detail is diminished due to considerable motion artifact. Lower chest: No acute abnormality. Hepatobiliary: Hepatic steatosis is identified. There is scratch set assessment for underlying enhancing liver lesions  is significantly diminished by motion artifact. No large enhancing liver masses identified. Small stones are noted layering within the dependent portion of the gallbladder which measure up to 8 mm, image 15/8. No significant gallbladder wall thickening, pericholecystic inflammation or free fluid. Mild intrahepatic bile duct dilatation, image 14/5. The common bile duct measures 8 mm in maximum diameter. There is no convincing evidence for choledocholithiasis. There is a duodenal diverticulum which wraps around the distal common bile duct, image 24/9 and image 14/8. On the coronal T2 weighted images there is a structure which exerts mass effect upon the distal common bile duct measuring approximately 11 mm, image 14/8. Although limited by motion artifact, this appears to represent portions of the duodenal diverticula. Pancreas:  No main duct dilatation, inflammation or mass. Spleen:  Within normal limits in size and appearance. Adrenals/Urinary Tract: Normal adrenal glands. No kidney mass or hydronephrosis identified. Stomach/Bowel: Stomach appears nondistended. 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 No dilated loops of large or small bowel. No signs of bowel wall thickening or inflammation. Vascular/Lymphatic: Aortic atherosclerotic calcifications. No aneurysm. The upper abdominal vascularity appears patent. No abdominal adenopathy. Other:  No significant free  fluid or fluid collections. Musculoskeletal: No suspicious bone lesions identified. IMPRESSION: 1. Exam detail is diminished due to motion artifact. 2. Gallstones. No evidence for choledocholithiasis. 3. There is a structure within the region of the head of pancreas at the level of the duodenal diverticulum which has mass effect upon the distal common bile duct. The duodenal diverticulum extends around the distal common bile duct at this level. According to the literature there have been reports of extra and intrahepatic bile duct dilatation secondary to compression of the intrapancreatic portions of the common bile duct by a periampullary diverticula. As this study is limited by excessive motion artifact, when the patient is clinically stable and able to remain motionless follow-up imaging is advised to exclude underlying mass lesion in this area. 4. Hepatic steatosis. Electronically Signed   By: Kerby Moors M.D.   On: 03/19/2022 12:43   MR 3D Recon At Scanner  Result Date: 03/19/2022 CLINICAL DATA:  Acute hepatitis. Elevated LFTs with possible choledocholithiasis. EXAM: MRI ABDOMEN WITHOUT AND WITH CONTRAST (INCLUDING MRCP) TECHNIQUE: Multiplanar multisequence MR imaging of the abdomen was performed both before and after the administration of intravenous contrast. Heavily T2-weighted images of the biliary and pancreatic ducts were obtained, and three-dimensional MRCP images were rendered by post processing. CONTRAST:  7.14m GADAVIST GADOBUTROL 1 MMOL/ML IV SOLN COMPARISON:  CT from 03/19/2022 FINDINGS: Exam detail is diminished due to considerable motion artifact. Lower chest: No acute abnormality. Hepatobiliary: Hepatic steatosis is identified. There is scratch set assessment for underlying enhancing liver lesions is significantly diminished by motion artifact. No large enhancing liver masses identified. Small stones are noted layering within the dependent portion of the gallbladder which measure up to 8  mm, image 15/8. No significant gallbladder wall thickening, pericholecystic inflammation or free fluid. Mild intrahepatic bile duct dilatation, image 14/5. The common bile duct measures 8 mm in maximum diameter. There is no convincing evidence for choledocholithiasis. There is a duodenal diverticulum which wraps around the distal common bile duct, image 24/9 and image 14/8. On the coronal T2 weighted images there is a structure which exerts mass effect upon the distal common bile duct measuring approximately 11 mm, image 14/8. Although limited by motion artifact, this appears to represent portions of the duodenal diverticula. Pancreas:  No main duct dilatation, inflammation or  mass. Spleen:  Within normal limits in size and appearance. Adrenals/Urinary Tract: Normal adrenal glands. No kidney mass or hydronephrosis identified. Stomach/Bowel: Stomach appears nondistended. 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 No dilated loops of large or small bowel. No signs of bowel wall thickening or inflammation. Vascular/Lymphatic: Aortic atherosclerotic calcifications. No aneurysm. The upper abdominal vascularity appears patent. No abdominal adenopathy. Other:  No significant free fluid or fluid collections. Musculoskeletal: No suspicious bone lesions identified. IMPRESSION: 1. Exam detail is diminished due to motion artifact. 2. Gallstones. No evidence for choledocholithiasis. 3. There is a structure within the region of the head of pancreas at the level of the duodenal diverticulum which has mass effect upon the distal common bile duct. The duodenal diverticulum extends around the distal common bile duct at this level. According to the literature there have been reports of extra and intrahepatic bile duct dilatation secondary to compression of the intrapancreatic portions of the common bile duct by a periampullary diverticula. As this study is limited by excessive motion artifact, when the patient  is clinically stable and able to remain motionless follow-up imaging is advised to exclude underlying mass lesion in this area. 4. Hepatic steatosis. Electronically Signed   By: Kerby Moors M.D.   On: 03/19/2022 12:43   CT T-SPINE NO CHARGE  Result Date: 03/19/2022 CLINICAL DATA:  Trauma/fall, chest pain EXAM: CT Thoracic and Lumbar spine with contrast TECHNIQUE: Multiplanar CT images of the thoracic and lumbar spine were reconstructed from contemporary CT of the Chest, Abdomen, and Pelvis. RADIATION DOSE REDUCTION: This exam was performed according to the departmental dose-optimization program which includes automated exposure control, adjustment of the mA and/or kV according to patient size and/or use of iterative reconstruction technique. CONTRAST:  No additional COMPARISON:  Concurrent CT chest abdomen pelvis FINDINGS: CT THORACIC SPINE FINDINGS Alignment: Normal thoracic kyphosis. Vertebrae: No acute fracture or focal pathologic process. Paraspinal and other soft tissues: Evaluated on dedicated CT chest. Disc levels: Mild degenerative changes of the lower thoracic spine. Spinal canal is patent. CT LUMBAR SPINE FINDINGS Segmentation: 5 lumbar type vertebral bodies. Alignment: Normal lumbar lordosis. Vertebrae: No acute fracture or focal pathologic process. Paraspinal and other soft tissues: Evaluated on dedicated CT abdomen/pelvis. Disc levels: Mild degenerative changes.  Spinal canal is patent. IMPRESSION: No traumatic injury to the thoracic or lumbar spine. Mild degenerative changes. Electronically Signed   By: Julian Hy M.D.   On: 03/19/2022 03:51   CT L-SPINE NO CHARGE  Result Date: 03/19/2022 CLINICAL DATA:  Trauma/fall, chest pain EXAM: CT Thoracic and Lumbar spine with contrast TECHNIQUE: Multiplanar CT images of the thoracic and lumbar spine were reconstructed from contemporary CT of the Chest, Abdomen, and Pelvis. RADIATION DOSE REDUCTION: This exam was performed according to the  departmental dose-optimization program which includes automated exposure control, adjustment of the mA and/or kV according to patient size and/or use of iterative reconstruction technique. CONTRAST:  No additional COMPARISON:  Concurrent CT chest abdomen pelvis FINDINGS: CT THORACIC SPINE FINDINGS Alignment: Normal thoracic kyphosis. Vertebrae: No acute fracture or focal pathologic process. Paraspinal and other soft tissues: Evaluated on dedicated CT chest. Disc levels: Mild degenerative changes of the lower thoracic spine. Spinal canal is patent. CT LUMBAR SPINE FINDINGS Segmentation: 5 lumbar type vertebral bodies. Alignment: Normal lumbar lordosis. Vertebrae: No acute fracture or focal pathologic process. Paraspinal and other soft tissues: Evaluated on dedicated CT abdomen/pelvis. Disc levels: Mild degenerative changes.  Spinal canal is patent. IMPRESSION: No traumatic injury  to the thoracic or lumbar spine. Mild degenerative changes. Electronically Signed   By: Julian Hy M.D.   On: 03/19/2022 03:51   CT CHEST ABDOMEN PELVIS W CONTRAST  Result Date: 03/19/2022 CLINICAL DATA:  Fall, chest pain EXAM: CT CHEST, ABDOMEN, AND PELVIS WITH CONTRAST TECHNIQUE: Multidetector CT imaging of the chest, abdomen and pelvis was performed following the standard protocol during bolus administration of intravenous contrast. RADIATION DOSE REDUCTION: This exam was performed according to the departmental dose-optimization program which includes automated exposure control, adjustment of the mA and/or kV according to patient size and/or use of iterative reconstruction technique. CONTRAST:  171m OMNIPAQUE IOHEXOL 300 MG/ML  SOLN COMPARISON:  CTA chest abdomen pelvis dated 10/19/2021 FINDINGS: CT CHEST FINDINGS Cardiovascular: No evidence of traumatic aortic injury. Mild atherosclerotic calcifications of the arch. Heart is normal in size.  No pericardial effusion. Mediastinum/Nodes: No evidence of intra mediastinal  hematoma. Small mediastinal lymph nodes not meet pathologic CT size criteria. Visualized thyroid is unremarkable. Lungs/Pleura: Moderate centrilobular and paraseptal emphysematous changes, upper lung predominant. No focal consolidation or aspiration. No suspicious pulmonary nodules. No pleural effusion or pneumothorax. Musculoskeletal: No fracture is seen. Sternum, clavicles, and scapulae are intact. Bilateral ribs are intact. Dedicated thoracic spine evaluation has been performed and will be reported separately. CT ABDOMEN PELVIS FINDINGS Hepatobiliary: Liver is within normal limits. No perihepatic fluid/hemorrhage. Mild layering gallstones (series 2/image 70). Mild central intrahepatic ductal prominence. Common duct measures 8 mm, at the upper limits of normal. Although poorly visualized on CT, a small distal CBD stone is suspected (series 2/image 75). Pancreas: Within normal limits. Spleen: Within normal limits. Adrenals/Urinary Tract: Adrenal glands are within normal limits. Subcentimeter medial left upper pole renal cyst (series 2/image 68), measuring simple fluid density, benign (Bosniak I). No follow-up is recommended. Right kidney is within normal limits. No hydronephrosis. Mildly thick-walled bladder, although underdistended. Stomach/Bowel: Stomach is within normal limits. No evidence of bowel obstruction. Duodenal diverticulum. Normal appendix (series 2/image 36). Sigmoid diverticulosis, without evidence of diverticulitis. Vascular/Lymphatic: No evidence of abdominal aortic aneurysm. 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 Atherosclerotic calcifications of the abdominal aorta and branch vessels. No suspicious abdominopelvic lymphadenopathy. Reproductive: Prostate is unremarkable. Other: No abdominopelvic ascites. No hemoperitoneum or free air. Musculoskeletal: No fracture is seen. Visualized bony pelvis and bilateral proximal femurs are intact. Old left superior and inferior  pubic rami fracture deformities. Dedicated lumbar spine evaluation has been performed and will be reported separately. IMPRESSION: No evidence of acute traumatic injury to the chest, abdomen, or pelvis. Cholelithiasis with suspected mild choledocholithiasis. Associated mild central intrahepatic ductal dilatation. Common duct measures 8 mm, at the upper limits of normal. Correlate with LFTs and consider ERCP as clinically warranted. Dedicated thoracolumbar spine evaluation has been performed and will be reported separately. Electronically Signed   By: SJulian HyM.D.   On: 03/19/2022 03:49   DG Chest 2 View  Result Date: 03/18/2022 CLINICAL DATA:  Chest pain.  Status post fall. EXAM: CHEST - 2 VIEW COMPARISON:  12/08/2021 FINDINGS: Heart size and mediastinal contours are unremarkable. There is no pleural effusion or edema. No airspace opacities identified. Chronic interstitial changes of COPD/emphysema noted. Visualized osseous structures appear intact. IMPRESSION: 1. No acute cardiopulmonary abnormalities. 2. COPD/emphysema. Electronically Signed   By: TKerby MoorsM.D.   On: 03/18/2022 20:54    Review of Systems  Gastrointestinal:  Positive for abdominal pain.  All other systems reviewed and are negative.  Blood pressure 118/77, pulse 84, temperature  98.6 F (37 C), temperature source Oral, resp. rate 16, height '5\' 8"'$  (1.727 m), weight 74.8 kg, SpO2 94 %. Physical Exam Constitutional:      Appearance: He is well-developed.  Eyes:     General: No scleral icterus. Pulmonary:     Effort: Pulmonary effort is normal.  Abdominal:     Tenderness: There is abdominal tenderness in the right upper quadrant and epigastric area. There is guarding. There is no rebound. Negative signs include Murphy's sign.  Musculoskeletal:        General: Normal range of motion.     Cervical back: Normal range of motion.  Skin:    General: Skin is warm and dry.  Neurological:     General: No focal deficit  present.     Mental Status: He is alert.  Psychiatric:        Mood and Affect: Mood normal.        Behavior: Behavior normal.     Assessment/Plan: Acute cholecystitis ? Choledocolithiasis /elevated LFT s Duodenal diverticulum?  Ok for clears for today NPO after midnight  Recheck LFTs in am if stable or down, consider cholecystectomy Sunday If elevated, reconsider ERCP   ABX IVF  Pain control    Total time 45 minutes   Keionna Kinnaird A Duvan Mousel 03/19/2022, 2:10 PM

## 2022-03-19 NOTE — Anesthesia Preprocedure Evaluation (Signed)
Anesthesia Evaluation    Reviewed: Allergy & Precautions, Patient's Chart, lab work & pertinent test results, Unable to perform ROS - Chart review only  History of Anesthesia Complications Negative for: history of anesthetic complications  Airway        Dental   Pulmonary COPD,  COPD inhaler, Current Smoker, former smoker,           Cardiovascular hypertension, Pt. on medications pulmonary hypertension+ CAD and + Past MI    Echo 04/2020 1. Left ventricular ejection fraction, by estimation, is 60 to 65%. The left ventricle has normal function. The left ventricle has no regional wall motion abnormalities. There is mild concentric left ventricular hypertrophy. Left ventricular diastolic parameters are consistent with Grade I diastolic dysfunction (impaired relaxation).  2. Right ventricular systolic function is normal. The right ventricular size is normal. There is moderately elevated pulmonary artery systolic pressure. The estimated right ventricular systolic pressure is 26.9 mmHg.  3. The mitral valve is normal in structure. No evidence of mitral valve regurgitation. No evidence of mitral stenosis.  4. The aortic valve is normal in structure. Aortic valve regurgitation is not visualized. No aortic stenosis is present.  5. The inferior vena cava is dilated in size with <50% respiratory variability, suggesting right atrial pressure of 15 mmHg.   Coronary CT 3/22 1. Coronary calcium score of 0. This was 0 percentile for age and sex matched control. 2. Normal coronary origin with right dominance. 3. No evidence of CAD. CAD RADS 0. 4. Aortic atherosclerosis. 5. Consider non atherosclerotic causes of chest pain.   Neuro/Psych  Headaches, Seizures -,  negative psych ROS   GI/Hepatic negative GI ROS, Hepatic steatosis   Endo/Other  diabetes, Poorly Controlled, Type 2, Oral Hypoglycemic Agents  Renal/GU negative Renal ROS      Musculoskeletal negative musculoskeletal ROS (+)   Abdominal   Peds  Hematology negative hematology ROS (+)   Anesthesia Other Findings   Reproductive/Obstetrics                             Anesthesia Physical  Anesthesia Plan  ASA: 4  Anesthesia Plan: General   Post-op Pain Management: Minimal or no pain anticipated   Induction: Intravenous  PONV Risk Score and Plan: 2 and Ondansetron, Midazolam, Dexamethasone and Treatment may vary due to age or medical condition  Airway Management Planned: Oral ETT  Additional Equipment: None  Intra-op Plan:   Post-operative Plan: Extubation in OR  Informed Consent:   Plan Discussed with:   Anesthesia Plan Comments:         Anesthesia Quick Evaluation

## 2022-03-19 NOTE — ED Provider Notes (Signed)
Emergency Department Provider Note   I have reviewed the triage vital signs and the nursing notes.   HISTORY  Chief Complaint Chest Pain  Recommended Spanish interpreter but family at bedside and patient requesting that they assist with translation.   HPI Duane Cuevas is a 66 y.o. male with past history reviewed below presents to the emergency department after fall 4 days prior.  Patient was walking up a ramp when he slipped and fell landing on his left side.  He has had some bruising to the left upper arm but also chest and abdominal discomfort.  He did not strike his head.  He is not having neck pain. No numbness or weakness. Mild SOB.    Past Medical History:  Diagnosis Date   Diabetes mellitus ([place]) 10/2014   pt denies being diabetic.    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     Review of Systems  Constitutional: No fever/chills Cardiovascular: Positive chest pain. Respiratory: Denies shortness of breath. Gastrointestinal: Positive abdominal pain.  No nausea, no vomiting.  No diarrhea.  No constipation. Genitourinary: Negative for dysuria. Musculoskeletal: Positive back pain.  Skin: Negative for rash. Neurological: Negative for headaches.  ____________________________________________   PHYSICAL EXAM:  VITAL SIGNS: ED Triage Vitals  Enc Vitals Group     BP 03/18/22 2030 116/76     Pulse Rate 03/18/22 2030 89     Resp 03/18/22 2030 20     Temp 03/18/22 2030 98.5 F (36.9 C)     Temp Source 03/19/22 0012 Oral     SpO2 03/18/22 2030 97 %     Weight 03/18/22 2028 164 lb 14.5 oz (74.8 kg)     Height 03/18/22 2028 _0  (1.727 m)   Constitutional: Alert and oriented. Well appearing and in no acute distress. Eyes: Conjunctivae are normal.  Head: Atraumatic. Nose: No congestion/rhinnorhea. Mouth/Throat: Mucous membranes are moist.   Neck: No stridor.    Cardiovascular: Normal rate, regular rhythm. Good peripheral circulation. Grossly normal heart sounds.   Respiratory: Normal respiratory effort.  No retractions. Lungs CTAB. Gastrointestinal: Soft and nontender. No distention.  Musculoskeletal: No lower extremity tenderness nor edema. No gross deformities of extremities.  Mild midline and as well as paraspinal tenderness in the thoracic/lumbar spine diffusely. Neurologic:  Normal speech and language. No gross focal neurologic deficits are appreciated.  Skin:  Skin is warm and dry.  No flank ecchymosis.  Bruising to the left upper extremity.   ____________________________________________   LABS (all labs ordered are listed, but only abnormal results are displayed)  Labs Reviewed  BASIC METABOLIC PANEL - Abnormal; Notable for the following components:      Result Value   Glucose, Bld 108 (*)    All other components within normal limits  CBC - Abnormal; Notable for the following components:   WBC 13.3 (*)    All other components within normal limits  HEPATIC FUNCTION PANEL - Abnormal; Notable for the following components:   AST 144 (*)    ALT 120 (*)    Alkaline Phosphatase 291 (*)    Total Bilirubin 3.0 (*)    Bilirubin, Direct 1.6 (*)    Indirect Bilirubin 1.4 (*)    All other components within normal limits  LIPASE, BLOOD  TROPONIN I (HIGH SENSITIVITY)  TROPONIN I (HIGH SENSITIVITY)   ____________________________________________  EKG   EKG Interpretation  Date/Time:  Friday March 18 2022 20:31:22 EDT Ventricular Rate:  85 PR Interval:  130 QRS Duration: 110 QT Interval:  369 QTC Calculation: 439 R Axis:   77 Text Interpretation: Sinus rhythm Ventricular premature complex Probable left atrial enlargement Confirmed by Nanda Quinton [phone]) on 03/19/2022 3:02:30 AM        ____________________________________________  RADIOLOGY  CT T-SPINE NO CHARGE  Result Date: 03/19/2022 CLINICAL DATA:  Trauma/fall, chest  pain EXAM: CT Thoracic and Lumbar spine with contrast TECHNIQUE: Multiplanar CT images of the thoracic and lumbar spine were reconstructed from contemporary CT of the Chest, Abdomen, and Pelvis. RADIATION DOSE REDUCTION: This exam was performed according to the departmental dose-optimization program which includes automated exposure control, adjustment of the mA and/or kV according to patient size and/or use of iterative reconstruction technique. CONTRAST:  No additional COMPARISON:  Concurrent CT chest abdomen pelvis FINDINGS: CT THORACIC SPINE FINDINGS Alignment: Normal thoracic kyphosis. Vertebrae: No acute fracture or focal pathologic process. Paraspinal and other soft tissues: Evaluated on dedicated CT chest. Disc levels: Mild degenerative changes of the lower thoracic spine. Spinal canal is patent. CT LUMBAR SPINE FINDINGS Segmentation: 5 lumbar type vertebral bodies. Alignment: Normal lumbar lordosis. Vertebrae: No acute fracture or focal pathologic process. Paraspinal and other soft tissues: Evaluated on dedicated CT abdomen/pelvis. Disc levels: Mild degenerative changes.  Spinal canal is patent. IMPRESSION: No traumatic injury to the thoracic or lumbar spine. Mild degenerative changes. Electronically Signed   By: Julian Hy M.D.   On: 03/19/2022 03:51   CT L-SPINE NO CHARGE  Result Date: 03/19/2022 CLINICAL DATA:  Trauma/fall, chest pain EXAM: CT Thoracic and Lumbar spine with contrast TECHNIQUE: Multiplanar CT images of the thoracic and lumbar spine were reconstructed from contemporary CT of the Chest, Abdomen, and Pelvis. RADIATION DOSE REDUCTION: This exam was performed according to the departmental dose-optimization program which includes automated exposure control, adjustment of the mA and/or kV according to patient size and/or use of iterative reconstruction technique. CONTRAST:  No additional COMPARISON:  Concurrent CT chest abdomen pelvis FINDINGS: CT THORACIC SPINE FINDINGS Alignment:  Normal thoracic kyphosis. Vertebrae: No acute fracture or focal pathologic process. Paraspinal and other soft tissues: Evaluated on dedicated CT chest. Disc levels: Mild degenerative changes of the lower thoracic spine. Spinal canal is patent. CT LUMBAR SPINE FINDINGS Segmentation: 5 lumbar type vertebral bodies. Alignment: Normal lumbar lordosis. Vertebrae: No acute fracture or focal pathologic process. Paraspinal and other soft tissues: Evaluated on dedicated CT abdomen/pelvis. Disc levels: Mild degenerative changes.  Spinal canal is patent. IMPRESSION: No traumatic injury to the thoracic or lumbar spine. Mild degenerative changes. Electronically Signed   By: Julian Hy M.D.   On: 03/19/2022 03:51   CT CHEST ABDOMEN PELVIS W CONTRAST  Result Date: 03/19/2022 CLINICAL DATA:  Fall, chest pain EXAM: CT CHEST, ABDOMEN, AND PELVIS WITH CONTRAST TECHNIQUE: Multidetector CT imaging of the chest, abdomen and pelvis was performed following the standard protocol during bolus administration of intravenous contrast. RADIATION DOSE REDUCTION: This exam was performed according to the departmental dose-optimization program which includes automated exposure control, adjustment of the mA and/or kV according to patient size and/or use of iterative reconstruction technique. CONTRAST:  181m OMNIPAQUE IOHEXOL 300 MG/ML  SOLN COMPARISON:  CTA chest abdomen pelvis dated 10/19/2021 FINDINGS: CT CHEST FINDINGS Cardiovascular: No evidence of traumatic aortic injury. Mild atherosclerotic calcifications of the arch. Heart is normal in size.  No pericardial effusion. Mediastinum/Nodes: No evidence of intra mediastinal hematoma. Small mediastinal lymph nodes not meet pathologic CT  size criteria. Visualized thyroid is unremarkable. Lungs/Pleura: Moderate centrilobular and paraseptal emphysematous changes, upper lung predominant. No focal consolidation or aspiration. No suspicious pulmonary nodules. No pleural effusion or  pneumothorax. Musculoskeletal: No fracture is seen. Sternum, clavicles, and scapulae are intact. Bilateral ribs are intact. Dedicated thoracic spine evaluation has been performed and will be reported separately. CT ABDOMEN PELVIS FINDINGS Hepatobiliary: Liver is within normal limits. No perihepatic fluid/hemorrhage. Mild layering gallstones (series 2/image 70). Mild central intrahepatic ductal prominence. Common duct measures 8 mm, at the upper limits of normal. Although poorly visualized on CT, a small distal CBD stone is suspected (series 2/image 75). Pancreas: Within normal limits. Spleen: Within normal limits. Adrenals/Urinary Tract: Adrenal glands are within normal limits. Subcentimeter medial left upper pole renal cyst (series 2/image 68), measuring simple fluid density, benign (Bosniak I). No follow-up is recommended. Right kidney is within normal limits. No hydronephrosis. Mildly thick-walled bladder, although underdistended. Stomach/Bowel: Stomach is within normal limits. No evidence of bowel obstruction. Duodenal diverticulum. Normal appendix (series 2/image 36). Sigmoid diverticulosis, without evidence of diverticulitis. Vascular/Lymphatic: No evidence of abdominal aortic aneurysm. 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 Atherosclerotic calcifications of the abdominal aorta and branch vessels. No suspicious abdominopelvic lymphadenopathy. Reproductive: Prostate is unremarkable. Other: No abdominopelvic ascites. No hemoperitoneum or free air. Musculoskeletal: No fracture is seen. Visualized bony pelvis and bilateral proximal femurs are intact. Old left superior and inferior pubic rami fracture deformities. Dedicated lumbar spine evaluation has been performed and will be reported separately. IMPRESSION: No evidence of acute traumatic injury to the chest, abdomen, or pelvis. Cholelithiasis with suspected mild choledocholithiasis. Associated mild central intrahepatic ductal  dilatation. Common duct measures 8 mm, at the upper limits of normal. Correlate with LFTs and consider ERCP as clinically warranted. Dedicated thoracolumbar spine evaluation has been performed and will be reported separately. Electronically Signed   By: Julian Hy M.D.   On: 03/19/2022 03:49   DG Chest 2 View  Result Date: 03/18/2022 CLINICAL DATA:  Chest pain.  Status post fall. EXAM: CHEST - 2 VIEW COMPARISON:  12/08/2021 FINDINGS: Heart size and mediastinal contours are unremarkable. There is no pleural effusion or edema. No airspace opacities identified. Chronic interstitial changes of COPD/emphysema noted. Visualized osseous structures appear intact. IMPRESSION: 1. No acute cardiopulmonary abnormalities. 2. COPD/emphysema. Electronically Signed   By: Kerby Moors M.D.   On: 03/18/2022 20:54    ____________________________________________   PROCEDURES  Procedure(s) performed:   Procedures  None  ____________________________________________   INITIAL IMPRESSION / ASSESSMENT AND PLAN / ED COURSE  Pertinent labs & imaging results that were available during my care of the patient were reviewed by me and considered in my medical decision making (see chart for details).   This patient is Presenting for Evaluation of CP, which does require a range of treatment options, and is a complaint that involves a high risk of morbidity and mortality.  The Differential Diagnoses includes but is not exclusive to acute coronary syndrome, aortic dissection, pulmonary embolism, cardiac tamponade, community-acquired pneumonia, pericarditis, musculoskeletal chest wall pain, etc.   Critical Interventions-    Medications  morphine (PF) 4 MG/ML injection 4 mg (4 mg Intravenous Given 03/19/22 0517)  lactated ringers infusion ( Intravenous New Bag/Given 03/19/22 0518)  iohexol (OMNIPAQUE) 300 MG/ML solution 100 mL (100 mLs Intravenous Contrast Given 03/19/22 0307)    Reassessment after intervention:  Patient pain remains.    I did obtain Additional Historical Information from family at bedside.  I decided to review  pertinent External Data, and in summary patient seen on 9/11 with various plain films as well as CT imaging of the head and cervical spine.   Clinical Laboratory Tests Ordered, included CBC with mild leukocytosis at 13.3.  Added on LFTs and lipase with bilirubin of 3, alk phos of 291, elevated LFTs.  Lipase normal.  No AKI.   Radiologic Tests Ordered, included CT chest/abdomen/pelvis and CXR. I independently interpreted the images and agree with radiology interpretation.   Cardiac Monitor Tracing which shows NSR.    Social Determinants of Health Risk no active smoking history.   Consult complete with [facility] as an established patient with them in April 2022. Messaged Dr. Henrene Pastor for daytime consultation.   Hospitalist Dr. Alcario Drought. Plan for admit by AM team.   Medical Decision Making: Summary:  Patient presents with chest/abdominal/back pain after fall on 9/11.  CT imaging ordered during MSE process.  Will not duplicate imaging of the head and neck, performed on 9/11.  Patient is not having pain in these locations.  Reevaluation with update and discussion with patient and daughter at bedside.  His CT scan shows dilated common bile duct and cholelithiasis.  Added on LFTs as the patient is complaining of abdominal discomfort and these are abnormal.  He seems to mainly have pain from fall but on reassessment is tender in the right upper quadrant.  He has seen [facility] in the office in April following EGD during an admission for chest discomfort.  LFTs from June of this year show no significant abnormality at that time.   Disposition: admit  ____________________________________________  FINAL CLINICAL IMPRESSION(S) / ED DIAGNOSES  Final diagnoses:  Generalized abdominal pain  Precordial chest pain  Elevated LFTs  Fall, initial encounter    Note:  This document was  prepared using Dragon voice recognition software and may include unintentional dictation errors.  Nanda Quinton, MD, [facility] Emergency Medicine    Marietta Sikkema, [facility], MD 03/19/22 [phone]

## 2022-03-19 NOTE — H&P (View-Only) (Signed)
Reason for Consult:gallstones/cholecystitis elevated LFTs Referring Physician: Henrene Pastor MD   Duane Cuevas is an 66 y.o. male.  HPI: Pleasant 66 yo male 2 day hx epigastric pain 8/10 sharp made better with pain meds but constant   Denies N/V or itching  CT showed gallstones and questionable distal CBD stone  MRCP poor quality and showed a duodenal diverticulum and some sort of mass like lesion? At distal CBD Surgery consulted for the above   Past Medical History:  Diagnosis Date   Diabetes mellitus ([place]) 10/2014   pt denies being diabetic.    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     Past Surgical History:  Procedure Laterality Date   BIOPSY  09/16/2020   Procedure: BIOPSY;  Surgeon: Thornton Park, MD;  Location: [facility];  Service: Gastroenterology;;   ESOPHAGOGASTRODUODENOSCOPY N/A 09/23/2015   Procedure: ESOPHAGOGASTRODUODENOSCOPY (EGD);  Surgeon: Ladene Artist, MD;  Location: [facility];  Service: Endoscopy;  Laterality: N/A;   ESOPHAGOGASTRODUODENOSCOPY (EGD) WITH PROPOFOL N/A 09/16/2020   Procedure: ESOPHAGOGASTRODUODENOSCOPY (EGD) WITH PROPOFOL;  Surgeon: Thornton Park, MD;  Location: [facility];  Service: Gastroenterology;  Laterality: N/A;   FOOT SURGERY     HERNIA REPAIR     INGUINAL HERNIA REPAIR Left 05/15/2018   Procedure: LEFT INGUINAL HERNIA REPAIR WITH MESH;  Surgeon: Erroll Luna, MD;  Location: [place];  Service: General;  Laterality: Left;   INSERTION OF MESH Left 05/15/2018   Procedure: INSERTION OF MESH;  Surgeon: Erroll Luna, MD;  Location: [place];  Service: General;  Laterality: Left;   PELVIC FRACTURE SURGERY     UPPER GASTROINTESTINAL ENDOSCOPY      Family History  Problem Relation Age of Onset   Emphysema Mother    Diabetes Mother    Emphysema Father    Cancer Sister        Stomach cancer   Diabetes Sister    Stomach cancer Sister     Diabetes Brother    Colon cancer Neg Hx    Colon polyps Neg Hx    Esophageal cancer Neg Hx    Rectal cancer Neg Hx     Social History:  reports that he has quit smoking. His smoking use included cigarettes. He has a 7.05 pack-year smoking history. He has never used smokeless tobacco. He reports current alcohol use. He reports that he does not use drugs.  Allergies: No Known Allergies  Medications: I have reviewed the patient's current medications.  Results for orders placed or performed during the hospital encounter of 03/18/22 (from the past 48 hour(s))  Basic metabolic panel     Status: Abnormal   Collection Time: 03/18/22 10:06 PM  Result Value Ref Range   Sodium 135 135 - 145 mmol/L   Potassium 4.0 3.5 - 5.1 mmol/L   Chloride 103 98 - 111 mmol/L   CO2 23 22 - 32 mmol/L   Glucose, Bld 108 (H) 70 - 99 mg/dL    Comment: Glucose reference range applies only to samples taken after fasting for at least 8 hours.   BUN 13 8 - 23 mg/dL   Creatinine, Ser 0.74 0.61 - 1.24 mg/dL   Calcium 9.1 8.9 - 10.3 mg/dL   GFR, Estimated >60 >60 mL/min    Comment: (NOTE) Calculated using the CKD-EPI Creatinine Equation (2021)    Anion gap 9 5 - 15    Comment: Performed at [facility]  [place] [address] [place], [place] [address]  CBC     Status: Abnormal   Collection Time: 03/18/22 10:06 PM  Result Value Ref Range   WBC 13.3 (H) 4.0 - 10.5 K/uL   RBC 5.18 4.22 - 5.81 MIL/uL   Hemoglobin 15.7 13.0 - 17.0 g/dL   HCT 47.0 39.0 - 52.0 %   MCV 90.7 80.0 - 100.0 fL   MCH 30.3 26.0 - 34.0 pg   MCHC 33.4 30.0 - 36.0 g/dL   RDW 13.2 11.5 - 15.5 %   Platelets 279 150 - 400 K/uL   nRBC 0.0 0.0 - 0.2 %    Comment: Performed at [facility], [place] [address], [place], [place] [address]  Troponin I (High Sensitivity)     Status: None   Collection Time: 03/18/22 10:06 PM  Result Value Ref Range   Troponin I (High Sensitivity) 6 <18 ng/L    Comment:  (NOTE) Elevated high sensitivity troponin I (hsTnI) values and significant  changes across serial measurements may suggest ACS but many other  chronic and acute conditions are known to elevate hsTnI results.  Refer to the "Links" section for chest pain algorithms and additional  guidance. Performed at [facility], [place] [address] [place], [place] [address]   Troponin I (High Sensitivity)     Status: None   Collection Time: 03/19/22  2:39 AM  Result Value Ref Range   Troponin I (High Sensitivity) 8 <18 ng/L    Comment: (NOTE) Elevated high sensitivity troponin I (hsTnI) values and significant  changes across serial measurements may suggest ACS but many other  chronic and acute conditions are known to elevate hsTnI results.  Refer to the "Links" section for chest pain algorithms and additional  guidance. Performed at [facility], [place] [address] [place], [place] [address]   Hepatic function panel     Status: Abnormal   Collection Time: 03/19/22  2:39 AM  Result Value Ref Range   Total Protein 7.8 6.5 - 8.1 g/dL   Albumin 4.0 3.5 - 5.0 g/dL   AST 144 (H) 15 - 41 U/L   ALT 120 (H) 0 - 44 U/L   Alkaline Phosphatase 291 (H) 38 - 126 U/L   Total Bilirubin 3.0 (H) 0.3 - 1.2 mg/dL   Bilirubin, Direct 1.6 (H) 0.0 - 0.2 mg/dL   Indirect Bilirubin 1.4 (H) 0.3 - 0.9 mg/dL    Comment: Performed at [facility], [place] [address] [place], [place] [address]  Lipase, blood     Status: None   Collection Time: 03/19/22  2:39 AM  Result Value Ref Range   Lipase 29 11 - 51 U/L    Comment: Performed at [facility], [place] [address] [place], [place] [address]    MR ABDOMEN MRCP W WO CONTAST  Result Date: 03/19/2022 CLINICAL DATA:  Acute hepatitis. Elevated LFTs with possible choledocholithiasis. EXAM: MRI ABDOMEN WITHOUT AND WITH CONTRAST (INCLUDING MRCP) TECHNIQUE: Multiplanar multisequence MR imaging of the abdomen was  performed both before and after the administration of intravenous contrast. Heavily T2-weighted images of the biliary and pancreatic ducts were obtained, and three-dimensional MRCP images were rendered by post processing. CONTRAST:  7.71m GADAVIST GADOBUTROL 1 MMOL/ML IV SOLN COMPARISON:  CT from 03/19/2022 FINDINGS: Exam detail is diminished due to considerable motion artifact. Lower chest: No acute abnormality. Hepatobiliary: Hepatic steatosis is identified. There is scratch set assessment for underlying enhancing liver lesions  is significantly diminished by motion artifact. No large enhancing liver masses identified. Small stones are noted layering within the dependent portion of the gallbladder which measure up to 8 mm, image 15/8. No significant gallbladder wall thickening, pericholecystic inflammation or free fluid. Mild intrahepatic bile duct dilatation, image 14/5. The common bile duct measures 8 mm in maximum diameter. There is no convincing evidence for choledocholithiasis. There is a duodenal diverticulum which wraps around the distal common bile duct, image 24/9 and image 14/8. On the coronal T2 weighted images there is a structure which exerts mass effect upon the distal common bile duct measuring approximately 11 mm, image 14/8. Although limited by motion artifact, this appears to represent portions of the duodenal diverticula. Pancreas:  No main duct dilatation, inflammation or mass. Spleen:  Within normal limits in size and appearance. Adrenals/Urinary Tract: Normal adrenal glands. No kidney mass or hydronephrosis identified. Stomach/Bowel: Stomach appears nondistended. 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 No dilated loops of large or small bowel. No signs of bowel wall thickening or inflammation. Vascular/Lymphatic: Aortic atherosclerotic calcifications. No aneurysm. The upper abdominal vascularity appears patent. No abdominal adenopathy. Other:  No significant free  fluid or fluid collections. Musculoskeletal: No suspicious bone lesions identified. IMPRESSION: 1. Exam detail is diminished due to motion artifact. 2. Gallstones. No evidence for choledocholithiasis. 3. There is a structure within the region of the head of pancreas at the level of the duodenal diverticulum which has mass effect upon the distal common bile duct. The duodenal diverticulum extends around the distal common bile duct at this level. According to the literature there have been reports of extra and intrahepatic bile duct dilatation secondary to compression of the intrapancreatic portions of the common bile duct by a periampullary diverticula. As this study is limited by excessive motion artifact, when the patient is clinically stable and able to remain motionless follow-up imaging is advised to exclude underlying mass lesion in this area. 4. Hepatic steatosis. Electronically Signed   By: Kerby Moors M.D.   On: 03/19/2022 12:43   MR 3D Recon At Scanner  Result Date: 03/19/2022 CLINICAL DATA:  Acute hepatitis. Elevated LFTs with possible choledocholithiasis. EXAM: MRI ABDOMEN WITHOUT AND WITH CONTRAST (INCLUDING MRCP) TECHNIQUE: Multiplanar multisequence MR imaging of the abdomen was performed both before and after the administration of intravenous contrast. Heavily T2-weighted images of the biliary and pancreatic ducts were obtained, and three-dimensional MRCP images were rendered by post processing. CONTRAST:  7.28m GADAVIST GADOBUTROL 1 MMOL/ML IV SOLN COMPARISON:  CT from 03/19/2022 FINDINGS: Exam detail is diminished due to considerable motion artifact. Lower chest: No acute abnormality. Hepatobiliary: Hepatic steatosis is identified. There is scratch set assessment for underlying enhancing liver lesions is significantly diminished by motion artifact. No large enhancing liver masses identified. Small stones are noted layering within the dependent portion of the gallbladder which measure up to 8  mm, image 15/8. No significant gallbladder wall thickening, pericholecystic inflammation or free fluid. Mild intrahepatic bile duct dilatation, image 14/5. The common bile duct measures 8 mm in maximum diameter. There is no convincing evidence for choledocholithiasis. There is a duodenal diverticulum which wraps around the distal common bile duct, image 24/9 and image 14/8. On the coronal T2 weighted images there is a structure which exerts mass effect upon the distal common bile duct measuring approximately 11 mm, image 14/8. Although limited by motion artifact, this appears to represent portions of the duodenal diverticula. Pancreas:  No main duct dilatation, inflammation or  mass. Spleen:  Within normal limits in size and appearance. Adrenals/Urinary Tract: Normal adrenal glands. No kidney mass or hydronephrosis identified. Stomach/Bowel: Stomach appears nondistended. 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 No dilated loops of large or small bowel. No signs of bowel wall thickening or inflammation. Vascular/Lymphatic: Aortic atherosclerotic calcifications. No aneurysm. The upper abdominal vascularity appears patent. No abdominal adenopathy. Other:  No significant free fluid or fluid collections. Musculoskeletal: No suspicious bone lesions identified. IMPRESSION: 1. Exam detail is diminished due to motion artifact. 2. Gallstones. No evidence for choledocholithiasis. 3. There is a structure within the region of the head of pancreas at the level of the duodenal diverticulum which has mass effect upon the distal common bile duct. The duodenal diverticulum extends around the distal common bile duct at this level. According to the literature there have been reports of extra and intrahepatic bile duct dilatation secondary to compression of the intrapancreatic portions of the common bile duct by a periampullary diverticula. As this study is limited by excessive motion artifact, when the patient  is clinically stable and able to remain motionless follow-up imaging is advised to exclude underlying mass lesion in this area. 4. Hepatic steatosis. Electronically Signed   By: Kerby Moors M.D.   On: 03/19/2022 12:43   CT T-SPINE NO CHARGE  Result Date: 03/19/2022 CLINICAL DATA:  Trauma/fall, chest pain EXAM: CT Thoracic and Lumbar spine with contrast TECHNIQUE: Multiplanar CT images of the thoracic and lumbar spine were reconstructed from contemporary CT of the Chest, Abdomen, and Pelvis. RADIATION DOSE REDUCTION: This exam was performed according to the departmental dose-optimization program which includes automated exposure control, adjustment of the mA and/or kV according to patient size and/or use of iterative reconstruction technique. CONTRAST:  No additional COMPARISON:  Concurrent CT chest abdomen pelvis FINDINGS: CT THORACIC SPINE FINDINGS Alignment: Normal thoracic kyphosis. Vertebrae: No acute fracture or focal pathologic process. Paraspinal and other soft tissues: Evaluated on dedicated CT chest. Disc levels: Mild degenerative changes of the lower thoracic spine. Spinal canal is patent. CT LUMBAR SPINE FINDINGS Segmentation: 5 lumbar type vertebral bodies. Alignment: Normal lumbar lordosis. Vertebrae: No acute fracture or focal pathologic process. Paraspinal and other soft tissues: Evaluated on dedicated CT abdomen/pelvis. Disc levels: Mild degenerative changes.  Spinal canal is patent. IMPRESSION: No traumatic injury to the thoracic or lumbar spine. Mild degenerative changes. Electronically Signed   By: Julian Hy M.D.   On: 03/19/2022 03:51   CT L-SPINE NO CHARGE  Result Date: 03/19/2022 CLINICAL DATA:  Trauma/fall, chest pain EXAM: CT Thoracic and Lumbar spine with contrast TECHNIQUE: Multiplanar CT images of the thoracic and lumbar spine were reconstructed from contemporary CT of the Chest, Abdomen, and Pelvis. RADIATION DOSE REDUCTION: This exam was performed according to the  departmental dose-optimization program which includes automated exposure control, adjustment of the mA and/or kV according to patient size and/or use of iterative reconstruction technique. CONTRAST:  No additional COMPARISON:  Concurrent CT chest abdomen pelvis FINDINGS: CT THORACIC SPINE FINDINGS Alignment: Normal thoracic kyphosis. Vertebrae: No acute fracture or focal pathologic process. Paraspinal and other soft tissues: Evaluated on dedicated CT chest. Disc levels: Mild degenerative changes of the lower thoracic spine. Spinal canal is patent. CT LUMBAR SPINE FINDINGS Segmentation: 5 lumbar type vertebral bodies. Alignment: Normal lumbar lordosis. Vertebrae: No acute fracture or focal pathologic process. Paraspinal and other soft tissues: Evaluated on dedicated CT abdomen/pelvis. Disc levels: Mild degenerative changes.  Spinal canal is patent. IMPRESSION: No traumatic injury  to the thoracic or lumbar spine. Mild degenerative changes. Electronically Signed   By: Julian Hy M.D.   On: 03/19/2022 03:51   CT CHEST ABDOMEN PELVIS W CONTRAST  Result Date: 03/19/2022 CLINICAL DATA:  Fall, chest pain EXAM: CT CHEST, ABDOMEN, AND PELVIS WITH CONTRAST TECHNIQUE: Multidetector CT imaging of the chest, abdomen and pelvis was performed following the standard protocol during bolus administration of intravenous contrast. RADIATION DOSE REDUCTION: This exam was performed according to the departmental dose-optimization program which includes automated exposure control, adjustment of the mA and/or kV according to patient size and/or use of iterative reconstruction technique. CONTRAST:  11m OMNIPAQUE IOHEXOL 300 MG/ML  SOLN COMPARISON:  CTA chest abdomen pelvis dated 10/19/2021 FINDINGS: CT CHEST FINDINGS Cardiovascular: No evidence of traumatic aortic injury. Mild atherosclerotic calcifications of the arch. Heart is normal in size.  No pericardial effusion. Mediastinum/Nodes: No evidence of intra mediastinal  hematoma. Small mediastinal lymph nodes not meet pathologic CT size criteria. Visualized thyroid is unremarkable. Lungs/Pleura: Moderate centrilobular and paraseptal emphysematous changes, upper lung predominant. No focal consolidation or aspiration. No suspicious pulmonary nodules. No pleural effusion or pneumothorax. Musculoskeletal: No fracture is seen. Sternum, clavicles, and scapulae are intact. Bilateral ribs are intact. Dedicated thoracic spine evaluation has been performed and will be reported separately. CT ABDOMEN PELVIS FINDINGS Hepatobiliary: Liver is within normal limits. No perihepatic fluid/hemorrhage. Mild layering gallstones (series 2/image 70). Mild central intrahepatic ductal prominence. Common duct measures 8 mm, at the upper limits of normal. Although poorly visualized on CT, a small distal CBD stone is suspected (series 2/image 75). Pancreas: Within normal limits. Spleen: Within normal limits. Adrenals/Urinary Tract: Adrenal glands are within normal limits. Subcentimeter medial left upper pole renal cyst (series 2/image 68), measuring simple fluid density, benign (Bosniak I). No follow-up is recommended. Right kidney is within normal limits. No hydronephrosis. Mildly thick-walled bladder, although underdistended. Stomach/Bowel: Stomach is within normal limits. No evidence of bowel obstruction. Duodenal diverticulum. Normal appendix (series 2/image 36). Sigmoid diverticulosis, without evidence of diverticulitis. Vascular/Lymphatic: No evidence of abdominal aortic aneurysm. 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 Atherosclerotic calcifications of the abdominal aorta and branch vessels. No suspicious abdominopelvic lymphadenopathy. Reproductive: Prostate is unremarkable. Other: No abdominopelvic ascites. No hemoperitoneum or free air. Musculoskeletal: No fracture is seen. Visualized bony pelvis and bilateral proximal femurs are intact. Old left superior and inferior  pubic rami fracture deformities. Dedicated lumbar spine evaluation has been performed and will be reported separately. IMPRESSION: No evidence of acute traumatic injury to the chest, abdomen, or pelvis. Cholelithiasis with suspected mild choledocholithiasis. Associated mild central intrahepatic ductal dilatation. Common duct measures 8 mm, at the upper limits of normal. Correlate with LFTs and consider ERCP as clinically warranted. Dedicated thoracolumbar spine evaluation has been performed and will be reported separately. Electronically Signed   By: SJulian HyM.D.   On: 03/19/2022 03:49   DG Chest 2 View  Result Date: 03/18/2022 CLINICAL DATA:  Chest pain.  Status post fall. EXAM: CHEST - 2 VIEW COMPARISON:  12/08/2021 FINDINGS: Heart size and mediastinal contours are unremarkable. There is no pleural effusion or edema. No airspace opacities identified. Chronic interstitial changes of COPD/emphysema noted. Visualized osseous structures appear intact. IMPRESSION: 1. No acute cardiopulmonary abnormalities. 2. COPD/emphysema. Electronically Signed   By: TKerby MoorsM.D.   On: 03/18/2022 20:54    Review of Systems  Gastrointestinal:  Positive for abdominal pain.  All other systems reviewed and are negative.  Blood pressure 118/77, pulse 84, temperature  98.6 F (37 C), temperature source Oral, resp. rate 16, height '5\' 8"'$  (1.727 m), weight 74.8 kg, SpO2 94 %. Physical Exam Constitutional:      Appearance: He is well-developed.  Eyes:     General: No scleral icterus. Pulmonary:     Effort: Pulmonary effort is normal.  Abdominal:     Tenderness: There is abdominal tenderness in the right upper quadrant and epigastric area. There is guarding. There is no rebound. Negative signs include Murphy's sign.  Musculoskeletal:        General: Normal range of motion.     Cervical back: Normal range of motion.  Skin:    General: Skin is warm and dry.  Neurological:     General: No focal deficit  present.     Mental Status: He is alert.  Psychiatric:        Mood and Affect: Mood normal.        Behavior: Behavior normal.     Assessment/Plan: Acute cholecystitis ? Choledocolithiasis /elevated LFT s Duodenal diverticulum?  Ok for clears for today NPO after midnight  Recheck LFTs in am if stable or down, consider cholecystectomy Sunday If elevated, reconsider ERCP   ABX IVF  Pain control    Total time 45 minutes   Duane Cuevas 03/19/2022, 2:10 PM

## 2022-03-19 NOTE — Consult Note (Addendum)
Consultation  Referring Provider: Dr. Laverta Baltimore    Primary Care Physician:  [name], MD Primary Gastroenterologist: Dr. Havery Moros        Reason for Consultation: Right upper quadrant pain and elevated LFTs            HPI:   Duane Cuevas is a 66 y.o. male with a past medical history of diabetes and emphysema/COPD, who initially presented to the ED on 03/18/2022 after a fall 4 days ago.  We are consulted in regards to elevated LFTs and some right upper quadrant discomfort.    Today, the patient explains that he came into the ER because he was walking up a ramp about 4 days ago when he slipped and fell landing on his left side and had some bruising of his left arm initially, but then started having some chest and abdominal discomfort the next day which worsened overnight and he started vomiting around 1:00 in the morning, telling me that he really has not had anything to eat in the past 24 hours as he was feeling nauseous and felt like he would vomit, even drinking water.  Prior to his fall was not having any trouble other than some heartburn and reflux.    Denies fever, chills, weight loss, change in bowel habits or symptoms that awaken him from sleep.  ED course: AST 144, ALT 120, alk phos 291, total bili 3.0, direct bili 1.6, lipase normal, CBC with a WBC of 13.3, glucose 108, negative troponins; CT a of the chest abdomen and pelvis showed no evidence of acute traumatic injury to the chest, abdomen or pelvis, cholelithiasis with suspected mild choledocholithiasis and associated mild central intrahepatic ductal dilation, CBD measures 8 mm  GI history: 09/16/2020 EGD Dr. Tarri Glenn - Esophageal plaques were found, consistent with candidiasis. Biopsied. This is the likely cause of dysphagia. - Z-line regular, 38 cm from the incisors. - Mild erosive gastropathy with no bleeding and no stigmata of recent bleeding. Biopsied. - Normal examined duodenum.  05/10/2018 colonoscopy Dr.  Havery Moros - Diverticulosis in the sigmoid colon and in the ascending colon. - One 3 mm polyp in the cecum, removed with a cold snare. Resected and retrieved. - One 4 mm polyp at the recto-sigmoid colon, removed with a cold snare. Resected and retrieved. - Tortuous colon. - The examination was otherwise normal.  Path-adenomatous polyps, repeat recommended in 5 years  Past Medical History:  Diagnosis Date   Diabetes mellitus ([place]) 10/2014   pt denies being diabetic.    Emphysema of lung ([place])    Emphysema/COPD ([place]) 10/2014   Hepatic steatosis    Thrombocytopenia ([place]) 09/2015   platelets in 120s.     Past Surgical History:  Procedure Laterality Date   BIOPSY  09/16/2020   Procedure: BIOPSY;  Surgeon: Thornton Park, MD;  Location: [facility];  Service: Gastroenterology;;   ESOPHAGOGASTRODUODENOSCOPY N/A 09/23/2015   Procedure: ESOPHAGOGASTRODUODENOSCOPY (EGD);  Surgeon: Ladene Artist, MD;  Location: [facility];  Service: Endoscopy;  Laterality: N/A;   ESOPHAGOGASTRODUODENOSCOPY (EGD) WITH PROPOFOL N/A 09/16/2020   Procedure: ESOPHAGOGASTRODUODENOSCOPY (EGD) WITH PROPOFOL;  Surgeon: Thornton Park, MD;  Location: [facility];  Service: Gastroenterology;  Laterality: N/A;   FOOT SURGERY     HERNIA REPAIR     INGUINAL HERNIA REPAIR Left 05/15/2018   Procedure: LEFT INGUINAL HERNIA REPAIR WITH MESH;  Surgeon: Erroll Luna, MD;  Location: [facility];  Service: General;  Laterality: Left;   INSERTION OF MESH Left 05/15/2018  Procedure: INSERTION OF MESH;  Surgeon: Erroll Luna, MD;  Location: [facility];  Service: General;  Laterality: Left;   PELVIC FRACTURE SURGERY     UPPER GASTROINTESTINAL ENDOSCOPY      Family History  Problem Relation Age of Onset   Emphysema Mother    Diabetes Mother    Emphysema Father    Cancer Sister        Stomach cancer   Diabetes Sister    Stomach cancer Sister    Diabetes Brother    Colon cancer Neg Hx    Colon polyps Neg Hx     Esophageal cancer Neg Hx    Rectal cancer Neg Hx     Social History   Tobacco Use   Smoking status: Former    Packs/day: 0.15    Years: 47.00    Total pack years: 7.05    Types: Cigarettes   Smokeless tobacco: Never  Vaping Use   Vaping Use: Never used  Substance Use Topics   Alcohol use: Yes    Comment: occ   Drug use: No    Prior to Admission medications   Medication Sig Start Date End Date Taking? Authorizing Provider  albuterol (PROVENTIL) (2.5 MG/3ML) 0.083% nebulizer solution USE 1 VIAL VIA NEBULIZER EVERY 6 HOURS AS NEEDED FOR WHEEZING OR SHORTNESS OF BREATH Patient taking differently: Take 2.5 mg by nebulization every 6 (six) hours as needed for shortness of breath. USE 1 VIAL VIA NEBULIZER EVERY 6 HOURS AS NEEDED FOR WHEEZING OR SHORTNESS OF BREATH 05/24/21  Yes Sagardia, Ines Bloomer, MD  albuterol (VENTOLIN HFA) 108 (90 Base) MCG/ACT inhaler Inhale 2 puffs into the lungs every 6 (six) hours as needed for wheezing or shortness of breath. 04/15/21  Yes Sagardia, Ines Bloomer, MD  amLODipine (NORVASC) 5 MG tablet TOMAR 1 TABLETA POR VAI ORAL UNA VEZ AL DIA Patient taking differently: Take 5 mg by mouth daily. 10/13/21  Yes Sagardia, Ines Bloomer, MD  atorvastatin (LIPITOR) 20 MG tablet TAKE ONE TABLET BY MOUTH ONE TIME DAILY Patient taking differently: Take 20 mg by mouth daily. 08/09/21  Yes Sagardia, Ines Bloomer, MD  Budeson-Glycopyrrol-Formoterol (BREZTRI AEROSPHERE) 160-9-4.8 MCG/ACT AERO Inhale 2 puffs into the lungs 2 (two) times daily. Patient taking differently: Inhale 2 puffs into the lungs daily as needed (for shortness of breath). 07/22/21  Yes Sagardia, Ines Bloomer, MD  cyclobenzaprine (FLEXERIL) 10 MG tablet Take 1 tablet (10 mg total) by mouth 2 (two) times daily as needed for muscle spasms. 10/19/21  Yes Dorie Rank, MD  Dulaglutide (TRULICITY) 1.5 VV/6.1YW SOPN Inject 1.5 mg into the skin once a week. Patient taking differently: Inject 1.5 mg into the skin once a  week. Thursday 08/10/21  Yes Sagardia, Ines Bloomer, MD  FARXIGA 10 MG TABS tablet Take 10 mg by mouth daily. 11/09/21  Yes [provider]  glipiZIDE (GLUCOTROL) 10 MG tablet Take 1 tablet (10 mg total) by mouth daily before breakfast. May take 1 additional tablet if blood glucose is 180 to 200 or higher 03/03/22  Yes Sagardia, Ines Bloomer, MD  metFORMIN (GLUCOPHAGE) 1000 MG tablet Take 1,000 mg by mouth 2 (two) times daily. 03/16/22  Yes [provider]  NOVOLOG FLEXPEN 100 UNIT/ML FlexPen Inject 30 Units into the skin in the morning. 01/12/22  Yes [provider]  pantoprazole (PROTONIX) 40 MG tablet TOMAR 1 TABLETA POR VIA ORAL UNA VEZ AL DIA Patient taking differently: Take 40 mg by mouth daily. 01/24/22  Yes Sagardia, Ines Bloomer, MD  benzonatate (TESSALON PERLES) 100 MG capsule Take 1 capsule (100 mg total) by mouth 3 (three) times daily as needed for cough. Patient not taking: Reported on 03/19/2022 12/22/21 12/22/22  Georgina Quint, MD  blood glucose meter kit and supplies KIT Dispense based on patient and insurance preference. Use up to four times daily as directed. 10/22/20   Georgina Quint, MD  blood glucose meter kit and supplies Dispense based on patient and insurance preference. Use up to four times daily as directed. (FOR ICD-10 E10.9, E11.9). 10/24/20   Georgina Quint, MD  doxycycline (VIBRA-TABS) 100 MG tablet Take 1 tablet (100 mg total) by mouth every 12 (twelve) hours. Patient not taking: Reported on 03/19/2022 12/10/21   Meredeth Ide, MD  glucose blood (ACCU-CHEK GUIDE) test strip Use as instructed 12/22/21   Georgina Quint, MD  Insulin Pen Needle (B-D ULTRAFINE III SHORT PEN) 31G X 8 MM MISC USAR PARA INYECTAR INSULINA 10/13/21   Sagardia, Eilleen Kempf, MD  Insulin Pen Needle (B-D ULTRAFINE III SHORT PEN) 31G X 8 MM MISC USAR PARA INYECTAR INSULINA 10/13/21   Sagardia, Eilleen Kempf, MD  methocarbamol (ROBAXIN) 500 MG tablet Take 1 tablet (500  mg total) by mouth 2 (two) times daily. Patient not taking: Reported on 03/19/2022 03/14/22   Prosperi, Christian H, PA-C  predniSONE (DELTASONE) 20 MG tablet Take 20 mg by mouth 2 (two) times daily. 03/16/22   [provider]  UBRELVY 100 MG TABS TOMAR 1 TALBETA POR VIA ORAL UNA VEZ AL DIA Patient not taking: Reported on 03/19/2022 02/09/22   Etta Grandchild, MD    Current Facility-Administered Medications  Medication Dose Route Frequency Provider Last Rate Last Admin   lactated ringers infusion   Intravenous Continuous Long, Arlyss Repress, MD 100 mL/hr at 03/19/22 0518 New Bag at 03/19/22 0518   morphine (PF) 4 MG/ML injection 4 mg  4 mg Intravenous Q2H PRN Long, Arlyss Repress, MD   4 mg at 03/19/22 3475   Current Outpatient Medications  Medication Sig Dispense Refill   albuterol (PROVENTIL) (2.5 MG/3ML) 0.083% nebulizer solution USE 1 VIAL VIA NEBULIZER EVERY 6 HOURS AS NEEDED FOR WHEEZING OR SHORTNESS OF BREATH (Patient taking differently: Take 2.5 mg by nebulization every 6 (six) hours as needed for shortness of breath. USE 1 VIAL VIA NEBULIZER EVERY 6 HOURS AS NEEDED FOR WHEEZING OR SHORTNESS OF BREATH) 150 mL 3   albuterol (VENTOLIN HFA) 108 (90 Base) MCG/ACT inhaler Inhale 2 puffs into the lungs every 6 (six) hours as needed for wheezing or shortness of breath. 1 each 3   amLODipine (NORVASC) 5 MG tablet TOMAR 1 TABLETA POR VAI ORAL UNA VEZ AL DIA (Patient taking differently: Take 5 mg by mouth daily.) 30 tablet 5   atorvastatin (LIPITOR) 20 MG tablet TAKE ONE TABLET BY MOUTH ONE TIME DAILY (Patient taking differently: Take 20 mg by mouth daily.) 90 tablet 3   Budeson-Glycopyrrol-Formoterol (BREZTRI AEROSPHERE) 160-9-4.8 MCG/ACT AERO Inhale 2 puffs into the lungs 2 (two) times daily. (Patient taking differently: Inhale 2 puffs into the lungs daily as needed (for shortness of breath).) 10.7 g 11   cyclobenzaprine (FLEXERIL) 10 MG tablet Take 1 tablet (10 mg total) by mouth 2 (two) times daily as  needed for muscle spasms. 20 tablet 0   Dulaglutide (TRULICITY) 1.5 MG/0.5ML SOPN Inject 1.5 mg into the skin once a week. (Patient taking differently: Inject 1.5 mg into the skin once a week. Thursday) 6 mL 3  FARXIGA 10 MG TABS tablet Take 10 mg by mouth daily.     glipiZIDE (GLUCOTROL) 10 MG tablet Take 1 tablet (10 mg total) by mouth daily before breakfast. May take 1 additional tablet if blood glucose is 180 to 200 or higher 60 tablet 1   metFORMIN (GLUCOPHAGE) 1000 MG tablet Take 1,000 mg by mouth 2 (two) times daily.     NOVOLOG FLEXPEN 100 UNIT/ML FlexPen Inject 30 Units into the skin in the morning.     pantoprazole (PROTONIX) 40 MG tablet TOMAR 1 TABLETA POR VIA ORAL UNA VEZ AL DIA (Patient taking differently: Take 40 mg by mouth daily.) 90 tablet 3   benzonatate (TESSALON PERLES) 100 MG capsule Take 1 capsule (100 mg total) by mouth 3 (three) times daily as needed for cough. (Patient not taking: Reported on 03/19/2022) 20 capsule 1   blood glucose meter kit and supplies KIT Dispense based on patient and insurance preference. Use up to four times daily as directed. 1 each 0   blood glucose meter kit and supplies Dispense based on patient and insurance preference. Use up to four times daily as directed. (FOR ICD-10 E10.9, E11.9). 1 each 0   doxycycline (VIBRA-TABS) 100 MG tablet Take 1 tablet (100 mg total) by mouth every 12 (twelve) hours. (Patient not taking: Reported on 03/19/2022) 8 tablet 0   glucose blood (ACCU-CHEK GUIDE) test strip Use as instructed 100 strip 11   Insulin Pen Needle (B-D ULTRAFINE III SHORT PEN) 31G X 8 MM MISC USAR PARA INYECTAR INSULINA 100 each 5   Insulin Pen Needle (B-D ULTRAFINE III SHORT PEN) 31G X 8 MM MISC USAR PARA INYECTAR INSULINA 100 each 5   methocarbamol (ROBAXIN) 500 MG tablet Take 1 tablet (500 mg total) by mouth 2 (two) times daily. (Patient not taking: Reported on 03/19/2022) 20 tablet 0   predniSONE (DELTASONE) 20 MG tablet Take 20 mg by mouth 2  (two) times daily.     UBRELVY 100 MG TABS TOMAR 1 TALBETA POR VIA ORAL UNA VEZ AL DIA (Patient not taking: Reported on 03/19/2022) 8 tablet 3   Facility-Administered Medications Ordered in Other Encounters  Medication Dose Route Frequency Provider Last Rate Last Admin   morphine 2 MG/ML injection             Allergies as of 03/18/2022   (No Known Allergies)     Review of Systems:    Constitutional: No weight loss, fever or chills Skin: No rash  Cardiovascular: No chest pain Respiratory: No SOB Gastrointestinal: See HPI and otherwise negative Genitourinary: No dysuria  Neurological: No headache, dizziness or syncope Musculoskeletal: No new muscle or joint pain Hematologic: +bruising on left arm Psychiatric: No history of depression or anxiety    Physical Exam:  Vital signs in last 24 hours: Temp:  [98.2 F (36.8 C)-98.5 F (36.9 C)] 98.2 F (36.8 C) (09/16 0500) Pulse Rate:  [85-99] 89 (09/16 0800) Resp:  [16-20] 16 (09/16 0800) BP: (107-128)/(71-100) 113/100 (09/16 0800) SpO2:  [91 %-97 %] 92 % (09/16 0800) Weight:  [74.8 kg] 74.8 kg (09/15 2028)   General:   Pleasant Hispanic male appears to be in NAD, Well developed, Well nourished, alert and cooperative Head:  Normocephalic and atraumatic. Eyes:   PEERL, EOMI. No icterus. Conjunctiva pink. Ears:  Normal auditory acuity. Neck:  Supple Throat: Oral cavity and pharynx without inflammation, swelling or lesion.  Lungs: Respirations even and unlabored. Lungs clear to auscultation bilaterally.   No wheezes, crackles, or  rhonchi.  Heart: Normal S1, S2. No MRG. Regular rate and rhythm. No peripheral edema, cyanosis or pallor.  Abdomen:  Soft, nondistended, marked generalized TTP to only light palpation with involuntary guarding/ possible some worse in RUQ. Normal bowel sounds. No appreciable masses or hepatomegaly. Rectal:  Not performed.  Msk:  Symmetrical without gross deformities. Peripheral pulses intact.  Extremities:   Without edema, no deformity or joint abnormality.  Neurologic:  Alert and  oriented x4;  grossly normal neurologically.  Skin:   Dry and intact. + Bruising of left upper arm Psychiatric: Demonstrates good judgement and reason without abnormal affect or behaviors.   LAB RESULTS: Recent Labs    03/18/22 2206  WBC 13.3*  HGB 15.7  HCT 47.0  PLT 279   BMET Recent Labs    03/18/22 2206  NA 135  K 4.0  CL 103  CO2 23  GLUCOSE 108*  BUN 13  CREATININE 0.74  CALCIUM 9.1   LFT Recent Labs    03/19/22 0239  PROT 7.8  ALBUMIN 4.0  AST 144*  ALT 120*  ALKPHOS 291*  BILITOT 3.0*  BILIDIR 1.6*  IBILI 1.4*   STUDIES: CT T-SPINE NO CHARGE  Result Date: 03/19/2022 CLINICAL DATA:  Trauma/fall, chest pain EXAM: CT Thoracic and Lumbar spine with contrast TECHNIQUE: Multiplanar CT images of the thoracic and lumbar spine were reconstructed from contemporary CT of the Chest, Abdomen, and Pelvis. RADIATION DOSE REDUCTION: This exam was performed according to the departmental dose-optimization program which includes automated exposure control, adjustment of the mA and/or kV according to patient size and/or use of iterative reconstruction technique. CONTRAST:  No additional COMPARISON:  Concurrent CT chest abdomen pelvis FINDINGS: CT THORACIC SPINE FINDINGS Alignment: Normal thoracic kyphosis. Vertebrae: No acute fracture or focal pathologic process. Paraspinal and other soft tissues: Evaluated on dedicated CT chest. Disc levels: Mild degenerative changes of the lower thoracic spine. Spinal canal is patent. CT LUMBAR SPINE FINDINGS Segmentation: 5 lumbar type vertebral bodies. Alignment: Normal lumbar lordosis. Vertebrae: No acute fracture or focal pathologic process. Paraspinal and other soft tissues: Evaluated on dedicated CT abdomen/pelvis. Disc levels: Mild degenerative changes.  Spinal canal is patent. IMPRESSION: No traumatic injury to the thoracic or lumbar spine. Mild degenerative changes.  Electronically Signed   By: Julian Hy M.D.   On: 03/19/2022 03:51   CT L-SPINE NO CHARGE  Result Date: 03/19/2022 CLINICAL DATA:  Trauma/fall, chest pain EXAM: CT Thoracic and Lumbar spine with contrast TECHNIQUE: Multiplanar CT images of the thoracic and lumbar spine were reconstructed from contemporary CT of the Chest, Abdomen, and Pelvis. RADIATION DOSE REDUCTION: This exam was performed according to the departmental dose-optimization program which includes automated exposure control, adjustment of the mA and/or kV according to patient size and/or use of iterative reconstruction technique. CONTRAST:  No additional COMPARISON:  Concurrent CT chest abdomen pelvis FINDINGS: CT THORACIC SPINE FINDINGS Alignment: Normal thoracic kyphosis. Vertebrae: No acute fracture or focal pathologic process. Paraspinal and other soft tissues: Evaluated on dedicated CT chest. Disc levels: Mild degenerative changes of the lower thoracic spine. Spinal canal is patent. CT LUMBAR SPINE FINDINGS Segmentation: 5 lumbar type vertebral bodies. Alignment: Normal lumbar lordosis. Vertebrae: No acute fracture or focal pathologic process. Paraspinal and other soft tissues: Evaluated on dedicated CT abdomen/pelvis. Disc levels: Mild degenerative changes.  Spinal canal is patent. IMPRESSION: No traumatic injury to the thoracic or lumbar spine. Mild degenerative changes. Electronically Signed   By: Julian Hy M.D.   On: 03/19/2022 03:51  CT CHEST ABDOMEN PELVIS W CONTRAST  Result Date: 03/19/2022 CLINICAL DATA:  Fall, chest pain EXAM: CT CHEST, ABDOMEN, AND PELVIS WITH CONTRAST TECHNIQUE: Multidetector CT imaging of the chest, abdomen and pelvis was performed following the standard protocol during bolus administration of intravenous contrast. RADIATION DOSE REDUCTION: This exam was performed according to the departmental dose-optimization program which includes automated exposure control, adjustment of the mA and/or kV  according to patient size and/or use of iterative reconstruction technique. CONTRAST:  152mL OMNIPAQUE IOHEXOL 300 MG/ML  SOLN COMPARISON:  CTA chest abdomen pelvis dated 10/19/2021 FINDINGS: CT CHEST FINDINGS Cardiovascular: No evidence of traumatic aortic injury. Mild atherosclerotic calcifications of the arch. Heart is normal in size.  No pericardial effusion. Mediastinum/Nodes: No evidence of intra mediastinal hematoma. Small mediastinal lymph nodes not meet pathologic CT size criteria. Visualized thyroid is unremarkable. Lungs/Pleura: Moderate centrilobular and paraseptal emphysematous changes, upper lung predominant. No focal consolidation or aspiration. No suspicious pulmonary nodules. No pleural effusion or pneumothorax. Musculoskeletal: No fracture is seen. Sternum, clavicles, and scapulae are intact. Bilateral ribs are intact. Dedicated thoracic spine evaluation has been performed and will be reported separately. CT ABDOMEN PELVIS FINDINGS Hepatobiliary: Liver is within normal limits. No perihepatic fluid/hemorrhage. Mild layering gallstones (series 2/image 70). Mild central intrahepatic ductal prominence. Common duct measures 8 mm, at the upper limits of normal. Although poorly visualized on CT, a small distal CBD stone is suspected (series 2/image 75). Pancreas: Within normal limits. Spleen: Within normal limits. Adrenals/Urinary Tract: Adrenal glands are within normal limits. Subcentimeter medial left upper pole renal cyst (series 2/image 68), measuring simple fluid density, benign (Bosniak I). No follow-up is recommended. Right kidney is within normal limits. No hydronephrosis. Mildly thick-walled bladder, although underdistended. Stomach/Bowel: Stomach is within normal limits. No evidence of bowel obstruction. Duodenal diverticulum. Normal appendix (series 2/image 36). Sigmoid diverticulosis, without evidence of diverticulitis. Vascular/Lymphatic: No evidence of abdominal aortic aneurysm. 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 Atherosclerotic calcifications of the abdominal aorta and branch vessels. No suspicious abdominopelvic lymphadenopathy. Reproductive: Prostate is unremarkable. Other: No abdominopelvic ascites. No hemoperitoneum or free air. Musculoskeletal: No fracture is seen. Visualized bony pelvis and bilateral proximal femurs are intact. Old left superior and inferior pubic rami fracture deformities. Dedicated lumbar spine evaluation has been performed and will be reported separately. IMPRESSION: No evidence of acute traumatic injury to the chest, abdomen, or pelvis. Cholelithiasis with suspected mild choledocholithiasis. Associated mild central intrahepatic ductal dilatation. Common duct measures 8 mm, at the upper limits of normal. Correlate with LFTs and consider ERCP as clinically warranted. Dedicated thoracolumbar spine evaluation has been performed and will be reported separately. Electronically Signed   By: Julian Hy M.D.   On: 03/19/2022 03:49   DG Chest 2 View  Result Date: 03/18/2022 CLINICAL DATA:  Chest pain.  Status post fall. EXAM: CHEST - 2 VIEW COMPARISON:  12/08/2021 FINDINGS: Heart size and mediastinal contours are unremarkable. There is no pleural effusion or edema. No airspace opacities identified. Chronic interstitial changes of COPD/emphysema noted. Visualized osseous structures appear intact. IMPRESSION: 1. No acute cardiopulmonary abnormalities. 2. COPD/emphysema. Electronically Signed   By: Kerby Moors M.D.   On: 03/18/2022 20:54     Impression / Plan:   Impression: 1.  Elevated LFTs and possible choledocholithiasis: AST 144, ALT 120, alk phos 291, total bili 3.0, WBC 13.3, CT with suspected choledocholithiasis, patient has generalized abdominal tenderness may be worse in the right upper quadrant after a fall; likely some bruising/trauma from  the event/muscle soreness plus choledocholithiasis 2.  Chronic reflux 3.  Recent  fall: Bruising of left arm and tenderness/muscle soreness over chest and abdomen  Plan: 1.  Ordered stat MRCP to be completed today.  Pending results we will plan on ERCP tomorrow. 2.  Did discuss ERCP with the patient including risks, benefits, limitations and alternatives and patient agrees proceed.  This is scheduled with Dr. Henrene Pastor. 3.  Patient to remain n.p.o. for now.  After MRI patient could have clear liquids but will need to be n.p.o. at midnight in time for procedure. 4.  Do not start Lovenox this evening as we are planning on likely procedure tomorrow 5.  Started the patient on Ciprofloxacin 400 mg twice daily given leukocytosis in the setting of choledocholithiasis 6.  Continue other supportive measures including antiemetics if needed  Thank you for your kind consultation, we will continue to follow.  Lavone Nian [facility]  03/19/2022, 8:39 AM  GI ATTENDING  History, laboratories, x-rays personally reviewed.  Patient personally seen and examined in the emergency room.  Agree with comprehensive consultation note as scribed above.  Patient presents to the emergency room with upper abdominal pain.  Noted to have abnormal liver tests imaging shows cholelithiasis and borderline common duct dilation.  Question of distal stone though not certain.  Lipase normal.  Suspect symptomatic choledocholithiasis versus colic.  Will start antibiotics.  Plan MRCP before proceeding with more invasive ERCP (if necessary).  In terms of ERCP,The nature of the procedure, as well as the risks (including but not limited to pancreatitis, perforation, bleeding, infection, failed cannulation), benefits, and alternatives were carefully and thoroughly reviewed with the patient. Ample time for discussion and questions allowed. The patient understood, was satisfied, and agreed to proceed.  Docia Chuck. Geri Seminole., M.D. [facility] Division of Gastroenterology

## 2022-03-19 NOTE — H&P (Signed)
History and Physical    Patient: Duane Cuevas SWF:[id number] DOB: 11/02/1955 DOA: 03/18/2022 DOS: the patient was seen and examined on 03/19/2022 PCP: [name], MD  Patient coming from: Home  Chief Complaint:  Chief Complaint  Patient presents with   Chest Pain   HPI: Duane Cuevas is a 66 y.o. male with medical history significant of HTN, HLD, tobacco abuse, COPD, DM2. Presenting with epigastric pain. He reports that 3 days ago, he began to have epigastric abdominal pain. He thought it was gas, so he tried some sodas to help move it along. His symptoms worsened yesterday. Soda wasn't helping. OTC meds for pain were not helping. He had episodes on N/V, but no diarrhea or fever. When his symptoms did not improve last night, he decided to come to the ED for evaluation. He denies any other aggravating or alleviating factors.   Review of Systems: As mentioned in the history of present illness. All other systems reviewed and are negative. Past Medical History:  Diagnosis Date   Diabetes mellitus ([place]) 10/2014   pt denies being diabetic.    Emphysema of lung ([place])    Emphysema/COPD ([place]) 10/2014   Hepatic steatosis    Thrombocytopenia ([place]) 09/2015   platelets in 120s.    Past Surgical History:  Procedure Laterality Date   BIOPSY  09/16/2020   Procedure: BIOPSY;  Surgeon: Thornton Park, MD;  Location: [facility];  Service: Gastroenterology;;   ESOPHAGOGASTRODUODENOSCOPY N/A 09/23/2015   Procedure: ESOPHAGOGASTRODUODENOSCOPY (EGD);  Surgeon: Ladene Artist, MD;  Location: [facility];  Service: Endoscopy;  Laterality: N/A;   ESOPHAGOGASTRODUODENOSCOPY (EGD) WITH PROPOFOL N/A 09/16/2020   Procedure: ESOPHAGOGASTRODUODENOSCOPY (EGD) WITH PROPOFOL;  Surgeon: Thornton Park, MD;  Location: [facility];  Service: Gastroenterology;  Laterality: N/A;   FOOT SURGERY     HERNIA REPAIR     INGUINAL HERNIA REPAIR Left 05/15/2018   Procedure: LEFT INGUINAL HERNIA  REPAIR WITH MESH;  Surgeon: Erroll Luna, MD;  Location: [place];  Service: General;  Laterality: Left;   INSERTION OF MESH Left 05/15/2018   Procedure: INSERTION OF MESH;  Surgeon: Erroll Luna, MD;  Location: [place];  Service: General;  Laterality: Left;   PELVIC FRACTURE SURGERY     UPPER GASTROINTESTINAL ENDOSCOPY     Social History:  reports that he has quit smoking. His smoking use included cigarettes. He has a 7.05 pack-year smoking history. He has never used smokeless tobacco. He reports current alcohol use. He reports that he does not use drugs.  Not on File  Family History  Problem Relation Age of Onset   Emphysema Mother    Diabetes Mother    Emphysema Father    Cancer Sister        Stomach cancer   Diabetes Sister    Stomach cancer Sister    Diabetes Brother    Colon cancer Neg Hx    Colon polyps Neg Hx    Esophageal cancer Neg Hx    Rectal cancer Neg Hx     Prior to Admission medications   Medication Sig Start Date End Date Taking? Authorizing Provider  albuterol (PROVENTIL) (2.5 MG/3ML) 0.083% nebulizer solution USE 1 VIAL VIA NEBULIZER EVERY 6 HOURS AS NEEDED FOR WHEEZING OR SHORTNESS OF BREATH Patient taking differently: Take 2.5 mg by nebulization every 6 (six) hours as needed for shortness of breath. USE 1 VIAL VIA NEBULIZER EVERY 6 HOURS AS NEEDED FOR WHEEZING OR SHORTNESS OF BREATH 05/24/21  Yes [name], MD  albuterol (  VENTOLIN HFA) 108 (90 Base) MCG/ACT inhaler Inhale 2 puffs into the lungs every 6 (six) hours as needed for wheezing or shortness of breath. 04/15/21  Yes Sagardia, Ines Bloomer, MD  amLODipine (NORVASC) 5 MG tablet TOMAR 1 TABLETA POR VAI ORAL UNA VEZ AL DIA Patient taking differently: Take 5 mg by mouth daily. 10/13/21  Yes Sagardia, Ines Bloomer, MD  atorvastatin (LIPITOR) 20 MG tablet TAKE ONE TABLET BY MOUTH ONE TIME DAILY Patient taking differently: Take 20 mg by mouth daily. 08/09/21  Yes Sagardia, Ines Bloomer, MD   Budeson-Glycopyrrol-Formoterol (BREZTRI AEROSPHERE) 160-9-4.8 MCG/ACT AERO Inhale 2 puffs into the lungs 2 (two) times daily. Patient taking differently: Inhale 2 puffs into the lungs daily as needed (for shortness of breath). 07/22/21  Yes Sagardia, Ines Bloomer, MD  cyclobenzaprine (FLEXERIL) 10 MG tablet Take 1 tablet (10 mg total) by mouth 2 (two) times daily as needed for muscle spasms. 10/19/21  Yes Dorie Rank, MD  Dulaglutide (TRULICITY) 1.5 VO/1.6WV SOPN Inject 1.5 mg into the skin once a week. Patient taking differently: Inject 1.5 mg into the skin once a week. Thursday 08/10/21  Yes Sagardia, Ines Bloomer, MD  FARXIGA 10 MG TABS tablet Take 10 mg by mouth daily. 11/09/21  Yes [provider]  glipiZIDE (GLUCOTROL) 10 MG tablet Take 1 tablet (10 mg total) by mouth daily before breakfast. May take 1 additional tablet if blood glucose is 180 to 200 or higher 03/03/22  Yes Sagardia, Ines Bloomer, MD  metFORMIN (GLUCOPHAGE) 1000 MG tablet Take 1,000 mg by mouth 2 (two) times daily. 03/16/22  Yes [provider]  NOVOLOG FLEXPEN 100 UNIT/ML FlexPen Inject 30 Units into the skin in the morning. 01/12/22  Yes [provider]  pantoprazole (PROTONIX) 40 MG tablet TOMAR 1 TABLETA POR VIA ORAL UNA VEZ AL DIA Patient taking differently: Take 40 mg by mouth daily. 01/24/22  Yes Sagardia, Ines Bloomer, MD  benzonatate (TESSALON PERLES) 100 MG capsule Take 1 capsule (100 mg total) by mouth 3 (three) times daily as needed for cough. Patient not taking: Reported on 03/19/2022 12/22/21 12/22/22  [name], MD  blood glucose meter kit and supplies KIT Dispense based on patient and insurance preference. Use up to four times daily as directed. 10/22/20   [name], MD  blood glucose meter kit and supplies Dispense based on patient and insurance preference. Use up to four times daily as directed. (FOR ICD-10 E10.9, E11.9). 10/24/20   [name], MD  doxycycline  (VIBRA-TABS) 100 MG tablet Take 1 tablet (100 mg total) by mouth every 12 (twelve) hours. Patient not taking: Reported on 03/19/2022 12/10/21   [name], MD  glucose blood (ACCU-CHEK GUIDE) test strip Use as instructed 12/22/21   [name], MD  Insulin Pen Needle (B-D ULTRAFINE III SHORT PEN) 31G X 8 MM MISC USAR PARA INYECTAR INSULINA 10/13/21   Sagardia, Ines Bloomer, MD  Insulin Pen Needle (B-D ULTRAFINE III SHORT PEN) 31G X 8 MM MISC USAR PARA INYECTAR INSULINA 10/13/21   Sagardia, Ines Bloomer, MD  methocarbamol (ROBAXIN) 500 MG tablet Take 1 tablet (500 mg total) by mouth 2 (two) times daily. Patient not taking: Reported on 03/19/2022 03/14/22   Prosperi, Christian H, PA-C  predniSONE (DELTASONE) 20 MG tablet Take 20 mg by mouth 2 (two) times daily. 03/16/22   [provider]  UBRELVY 100 MG TABS TOMAR 1 TALBETA POR VIA ORAL UNA VEZ AL DIA Patient not taking: Reported on 03/19/2022  02/09/22   Janith Lima, MD    Physical Exam: Vitals:   03/19/22 0430 03/19/22 0500 03/19/22 0600 03/19/22 0700  BP: 127/85 113/78 116/85 107/71  Pulse: 89 88 92 85  Resp: _0 Temp:  98.2 F (36.8 C)    TempSrc:      SpO2: 91% 91% 93% 91%  Weight:      Height:       General: 66 y.o. male resting in bed in NAD Eyes: PERRL, normal sclera ENMT: Nares patent w/o discharge, orophaynx clear, dentition normal, ears w/o discharge/lesions/ulcers Neck: Supple, trachea midline Cardiovascular: RRR, +S1, S2, no m/g/r, equal pulses throughout Respiratory: CTABL, no w/r/r, normal WOB GI: BS+, ND, soft, TTP RLQ/RUQ/LUQ, no masses noted, no organomegaly noted MSK: No e/c/c Neuro: A&O x 3, no focal deficits Psyc: Appropriate interaction and affect, calm/cooperative  Data Reviewed:  Results for orders placed or performed during the hospital encounter of 03/18/22 (from the past 24 hour(s))  Basic metabolic panel     Status: Abnormal   Collection Time: 03/18/22 10:06 PM  Result Value Ref  Range   Sodium 135 135 - 145 mmol/L   Potassium 4.0 3.5 - 5.1 mmol/L   Chloride 103 98 - 111 mmol/L   CO2 23 22 - 32 mmol/L   Glucose, Bld 108 (H) 70 - 99 mg/dL   BUN 13 8 - 23 mg/dL   Creatinine, Ser 0.74 0.61 - 1.24 mg/dL   Calcium 9.1 8.9 - 10.3 mg/dL   GFR, Estimated >60 >60 mL/min   Anion gap 9 5 - 15  CBC     Status: Abnormal   Collection Time: 03/18/22 10:06 PM  Result Value Ref Range   WBC 13.3 (H) 4.0 - 10.5 K/uL   RBC 5.18 4.22 - 5.81 MIL/uL   Hemoglobin 15.7 13.0 - 17.0 g/dL   HCT 47.0 39.0 - 52.0 %   MCV 90.7 80.0 - 100.0 fL   MCH 30.3 26.0 - 34.0 pg   MCHC 33.4 30.0 - 36.0 g/dL   RDW 13.2 11.5 - 15.5 %   Platelets 279 150 - 400 K/uL   nRBC 0.0 0.0 - 0.2 %  Troponin I (High Sensitivity)     Status: None   Collection Time: 03/18/22 10:06 PM  Result Value Ref Range   Troponin I (High Sensitivity) 6 <18 ng/L  Troponin I (High Sensitivity)     Status: None   Collection Time: 03/19/22  2:39 AM  Result Value Ref Range   Troponin I (High Sensitivity) 8 <18 ng/L  Hepatic function panel     Status: Abnormal   Collection Time: 03/19/22  2:39 AM  Result Value Ref Range   Total Protein 7.8 6.5 - 8.1 g/dL   Albumin 4.0 3.5 - 5.0 g/dL   AST 144 (H) 15 - 41 U/L   ALT 120 (H) 0 - 44 U/L   Alkaline Phosphatase 291 (H) 38 - 126 U/L   Total Bilirubin 3.0 (H) 0.3 - 1.2 mg/dL   Bilirubin, Direct 1.6 (H) 0.0 - 0.2 mg/dL   Indirect Bilirubin 1.4 (H) 0.3 - 0.9 mg/dL  Lipase, blood     Status: None   Collection Time: 03/19/22  2:39 AM  Result Value Ref Range   Lipase 29 11 - 51 U/L   CXR: 1. No acute cardiopulmonary abnormalities. 2. COPD/emphysema.  CT ab/pelvis No evidence of acute traumatic injury to the chest, abdomen, or pelvis. Cholelithiasis with suspected mild choledocholithiasis. Associated mild  central intrahepatic ductal dilatation. Common duct measures 8 mm, at the upper limits of normal. Correlate with LFTs and consider ERCP as clinically  warranted. Dedicated thoracolumbar spine evaluation has been performed and will be reported separately.  Assessment and Plan: Biliary Obstruction Choledocholithiasis     - admit to inpt, med-surg     - MRCP ordered; remain NPO for now; can have CLD after the MR, but then return to NPOpMN     - fluids, anti-emetics, pain control     - LBGI onboard, appreciate assistance     - started on cipro; continue  Tobacco abuse     - counsel against further use  COPD     - continue home regimen when confirmed  HTN     - can resume home regimen when his comes off NPO status  HLD     - hold home regimen d/t elevated LFTs  DM2     - check A1c     - SSI, glucose checks     - NPO for now  Recent fall to left side     - fell at home while walking up a ramp at home; injured left side     - seen in ED on 03/14/22; imaging was negative; discharged to home w/ pain relief and rec'd to f/u w/ PCP/orthopedics     - PRN pain control  Advance Care Planning:   Code Status: FULL  Consults: LBGI  Family Communication: None at bedside  Severity of Illness: The appropriate patient status for this patient is INPATIENT. Inpatient status is judged to be reasonable and necessary in order to provide the required intensity of service to ensure the patient's safety. The patient's presenting symptoms, physical exam findings, and initial radiographic and laboratory data in the context of their chronic comorbidities is felt to place them at high risk for further clinical deterioration. Furthermore, it is not anticipated that the patient will be medically stable for discharge from the hospital within 2 midnights of admission.   * I certify that at the point of admission it is my clinical judgment that the patient will require inpatient hospital care spanning beyond 2 midnights from the point of admission due to high intensity of service, high risk for further deterioration and high frequency of surveillance  required.*  Time spent in coordination of this H&P: 30 minutes  Author: Jonnie Finner, DO 03/19/2022 7:20 AM  For on call review [email or web address].

## 2022-03-19 NOTE — Progress Notes (Addendum)
GI ATTENDING  MRCP reviewed.  The duct is upper limits of normal.  No obvious choledocholithiasis.  The noted indentation of the distal bile duct is highly likely benign and unrelated to the clinical presentation.   I believe this patient has symptomatic cholelithiasis.  Recommend laparoscopic cholecystectomy with IOC.  We will obtain surgical opinion.  Tentative plans for ERCP tomorrow been canceled.  Continue antibiotics.  Docia Chuck. Geri Seminole., M.D. [facility] Division of Gastroenterology

## 2022-03-20 ENCOUNTER — Encounter (HOSPITAL_COMMUNITY)
Admission: EM | Disposition: A | Discharge status: Home / Self Care | Payer: Self-pay | Source: Home / Self Care | Attending: Internal Medicine

## 2022-03-20 ENCOUNTER — Inpatient Hospital Stay (HOSPITAL_COMMUNITY): Payer: Medicare HMO | Admitting: Certified Registered Nurse Anesthetist

## 2022-03-20 ENCOUNTER — Inpatient Hospital Stay (HOSPITAL_COMMUNITY): Payer: Medicare HMO

## 2022-03-20 ENCOUNTER — Other Ambulatory Visit: Payer: Self-pay

## 2022-03-20 ENCOUNTER — Encounter (HOSPITAL_COMMUNITY): Payer: Self-pay | Admitting: Internal Medicine

## 2022-03-20 DIAGNOSIS — I2510 Atherosclerotic heart disease of native coronary artery without angina pectoris: Secondary | ICD-10-CM

## 2022-03-20 DIAGNOSIS — I10 Essential (primary) hypertension: Secondary | ICD-10-CM | POA: Diagnosis not present

## 2022-03-20 DIAGNOSIS — Z87891 Personal history of nicotine dependence: Secondary | ICD-10-CM

## 2022-03-20 DIAGNOSIS — K831 Obstruction of bile duct: Secondary | ICD-10-CM | POA: Diagnosis not present

## 2022-03-20 DIAGNOSIS — K810 Acute cholecystitis: Secondary | ICD-10-CM | POA: Diagnosis not present

## 2022-03-20 DIAGNOSIS — I252 Old myocardial infarction: Secondary | ICD-10-CM | POA: Diagnosis not present

## 2022-03-20 DIAGNOSIS — K8000 Calculus of gallbladder with acute cholecystitis without obstruction: Secondary | ICD-10-CM

## 2022-03-20 HISTORY — PX: INTRAOPERATIVE CHOLANGIOGRAM: SHX5230

## 2022-03-20 HISTORY — PX: CHOLECYSTECTOMY: SHX55

## 2022-03-20 LAB — GLUCOSE, CAPILLARY
Glucose-Capillary: 107 mg/dL — ABNORMAL HIGH (ref 70–99)
Glucose-Capillary: 108 mg/dL — ABNORMAL HIGH (ref 70–99)
Glucose-Capillary: 202 mg/dL — ABNORMAL HIGH (ref 70–99)
Glucose-Capillary: 156 mg/dL — ABNORMAL HIGH (ref 70–99)

## 2022-03-20 LAB — CBC
HCT: 45.2 % (ref 39.0–52.0)
Hemoglobin: 15 g/dL (ref 13.0–17.0)
MCH: 30.2 pg (ref 26.0–34.0)
MCHC: 33.2 g/dL (ref 30.0–36.0)
MCV: 90.9 fL (ref 80.0–100.0)
Platelets: 283 10*3/uL (ref 150–400)
RBC: 4.97 MIL/uL (ref 4.22–5.81)
RDW: 13.4 % (ref 11.5–15.5)
WBC: 6.9 10*3/uL (ref 4.0–10.5)
nRBC: 0 % (ref 0.0–0.2)

## 2022-03-20 LAB — COMPREHENSIVE METABOLIC PANEL
ALT: 170 U/L — ABNORMAL HIGH (ref 0–44)
AST: 122 U/L — ABNORMAL HIGH (ref 15–41)
Albumin: 3.2 g/dL — ABNORMAL LOW (ref 3.5–5.0)
Alkaline Phosphatase: 313 U/L — ABNORMAL HIGH (ref 38–126)
Anion gap: 8 (ref 5–15)
BUN: 11 mg/dL (ref 8–23)
CO2: 25 mmol/L (ref 22–32)
Calcium: 8.9 mg/dL (ref 8.9–10.3)
Chloride: 103 mmol/L (ref 98–111)
Creatinine, Ser: 0.7 mg/dL (ref 0.61–1.24)
GFR, Estimated: >60 mL/min (ref >60)
Glucose, Bld: 98 mg/dL (ref 70–99)
Potassium: 4.1 mmol/L (ref 3.5–5.1)
Sodium: 136 mmol/L (ref 135–145)
Total Bilirubin: 1.8 mg/dL — ABNORMAL HIGH (ref 0.3–1.2)
Total Protein: 6.9 g/dL (ref 6.5–8.1)

## 2022-03-20 LAB — HEMOGLOBIN A1C
Hgb A1c MFr Bld: 6.8 % — ABNORMAL HIGH (ref 4.8–5.6)
Mean Plasma Glucose: 148.46 mg/dL

## 2022-03-20 SURGERY — LAPAROSCOPIC CHOLECYSTECTOMY
Anesthesia: General | Site: Abdomen

## 2022-03-20 SURGERY — ERCP, WITH INTERVENTION IF INDICATED
Anesthesia: General

## 2022-03-20 MED ORDER — LIDOCAINE HCL (PF) 2 % IJ SOLN
INTRAMUSCULAR | Status: AC
  Filled 2022-03-20: qty 5

## 2022-03-20 MED ORDER — BUPIVACAINE-EPINEPHRINE (PF) 0.25% -1:200000 IJ SOLN
INTRAMUSCULAR | Status: DC | PRN
  Administered 2022-03-20: 30 mL

## 2022-03-20 MED ORDER — HYDROMORPHONE HCL 1 MG/ML IJ SOLN
1.0000 mg | INTRAMUSCULAR | Status: DC | PRN
  Administered 2022-03-21 – 2022-03-22 (×3): 1 mg via INTRAVENOUS
  Filled 2022-03-20 (×3): qty 1

## 2022-03-20 MED ORDER — PROMETHAZINE HCL 25 MG/ML IJ SOLN: 6.2500 mg | INTRAMUSCULAR | Status: DC | PRN

## 2022-03-20 MED ORDER — BUPIVACAINE-EPINEPHRINE (PF) 0.25% -1:200000 IJ SOLN
INTRAMUSCULAR | Status: AC
  Filled 2022-03-20: qty 30

## 2022-03-20 MED ORDER — HYDROMORPHONE HCL 1 MG/ML IJ SOLN
INTRAMUSCULAR | Status: AC
  Filled 2022-03-20: qty 1

## 2022-03-20 MED ORDER — ROCURONIUM BROMIDE 10 MG/ML (PF) SYRINGE
PREFILLED_SYRINGE | INTRAVENOUS | Status: DC | PRN
  Administered 2022-03-20: 60 mg via INTRAVENOUS

## 2022-03-20 MED ORDER — PROPOFOL 10 MG/ML IV BOLUS
INTRAVENOUS | Status: AC
  Filled 2022-03-20: qty 20

## 2022-03-20 MED ORDER — GLIPIZIDE 10 MG PO TABS
10.0000 mg | ORAL_TABLET | Freq: Every day | ORAL | Status: DC
  Administered 2022-03-21: 10 mg via ORAL
  Filled 2022-03-20: qty 1

## 2022-03-20 MED ORDER — HEMOSTATIC AGENTS (NO CHARGE) OPTIME
TOPICAL | Status: DC | PRN
  Administered 2022-03-20: 1

## 2022-03-20 MED ORDER — ACETAMINOPHEN 10 MG/ML IV SOLN
1000.0000 mg | Freq: Once | INTRAVENOUS | Status: AC
  Administered 2022-03-20: 1000 mg via INTRAVENOUS

## 2022-03-20 MED ORDER — TRAMADOL HCL 50 MG PO TABS
50.0000 mg | ORAL_TABLET | Freq: Four times a day (QID) | ORAL | Status: DC | PRN
  Administered 2022-03-20 – 2022-03-21 (×2): 50 mg via ORAL
  Filled 2022-03-20 (×2): qty 1

## 2022-03-20 MED ORDER — ONDANSETRON 4 MG PO TBDP: 4.0000 mg | ORAL_TABLET | Freq: Four times a day (QID) | ORAL | Status: DC | PRN

## 2022-03-20 MED ORDER — SODIUM CHLORIDE 0.45 % IV SOLN: INTRAVENOUS | Status: DC

## 2022-03-20 MED ORDER — METFORMIN HCL 500 MG PO TABS: 1000.0000 mg | ORAL_TABLET | Freq: Two times a day (BID) | ORAL | Status: DC

## 2022-03-20 MED ORDER — ONDANSETRON HCL 4 MG/2ML IJ SOLN: 4.0000 mg | Freq: Four times a day (QID) | INTRAMUSCULAR | Status: DC | PRN

## 2022-03-20 MED ORDER — ONDANSETRON HCL 4 MG/2ML IJ SOLN
INTRAMUSCULAR | Status: DC | PRN
  Administered 2022-03-20: 4 mg via INTRAVENOUS

## 2022-03-20 MED ORDER — ACETAMINOPHEN 10 MG/ML IV SOLN
INTRAVENOUS | Status: AC
  Filled 2022-03-20: qty 100

## 2022-03-20 MED ORDER — AMLODIPINE BESYLATE 5 MG PO TABS
5.0000 mg | ORAL_TABLET | Freq: Every day | ORAL | Status: DC
  Administered 2022-03-21 – 2022-03-22 (×2): 5 mg via ORAL
  Filled 2022-03-20 (×3): qty 1

## 2022-03-20 MED ORDER — ONDANSETRON HCL 4 MG/2ML IJ SOLN
INTRAMUSCULAR | Status: AC
  Filled 2022-03-20: qty 2

## 2022-03-20 MED ORDER — CHLORHEXIDINE GLUCONATE CLOTH 2 % EX PADS
6.0000 | MEDICATED_PAD | Freq: Once | CUTANEOUS | Status: AC
  Administered 2022-03-21: 6 via TOPICAL

## 2022-03-20 MED ORDER — CHLORHEXIDINE GLUCONATE CLOTH 2 % EX PADS
6.0000 | MEDICATED_PAD | Freq: Once | CUTANEOUS | Status: AC
  Administered 2022-03-20: 6 via TOPICAL

## 2022-03-20 MED ORDER — ACETAMINOPHEN 650 MG RE SUPP: 650.0000 mg | Freq: Four times a day (QID) | RECTAL | Status: DC | PRN

## 2022-03-20 MED ORDER — FENTANYL CITRATE (PF) 100 MCG/2ML IJ SOLN
INTRAMUSCULAR | Status: DC | PRN
  Administered 2022-03-20 (×2): 100 ug via INTRAVENOUS
  Administered 2022-03-20: 50 ug via INTRAVENOUS

## 2022-03-20 MED ORDER — LACTATED RINGERS IR SOLN
Status: DC | PRN
  Administered 2022-03-20: 1000 mL

## 2022-03-20 MED ORDER — SUGAMMADEX SODIUM 200 MG/2ML IV SOLN
INTRAVENOUS | Status: DC | PRN
  Administered 2022-03-20: 200 mg via INTRAVENOUS

## 2022-03-20 MED ORDER — PROPOFOL 10 MG/ML IV BOLUS
INTRAVENOUS | Status: DC | PRN
  Administered 2022-03-20: 120 mg via INTRAVENOUS

## 2022-03-20 MED ORDER — HYDROMORPHONE HCL 1 MG/ML IJ SOLN
INTRAMUSCULAR | Status: DC | PRN
  Administered 2022-03-20 (×2): 1 mg via INTRAVENOUS

## 2022-03-20 MED ORDER — FENTANYL CITRATE (PF) 250 MCG/5ML IJ SOLN
INTRAMUSCULAR | Status: AC
  Filled 2022-03-20: qty 5

## 2022-03-20 MED ORDER — PANTOPRAZOLE SODIUM 40 MG PO TBEC
40.0000 mg | DELAYED_RELEASE_TABLET | Freq: Every day | ORAL | Status: DC
  Administered 2022-03-20 – 2022-03-22 (×3): 40 mg via ORAL
  Filled 2022-03-20 (×3): qty 1

## 2022-03-20 MED ORDER — FENTANYL CITRATE PF 50 MCG/ML IJ SOSY: 25.0000 ug | PREFILLED_SYRINGE | INTRAMUSCULAR | Status: DC | PRN

## 2022-03-20 MED ORDER — IOHEXOL 300 MG/ML  SOLN
INTRAMUSCULAR | Status: DC | PRN
  Administered 2022-03-20: 10 mL

## 2022-03-20 MED ORDER — LACTATED RINGERS IV SOLN: INTRAVENOUS | Status: DC | PRN

## 2022-03-20 MED ORDER — DEXAMETHASONE SODIUM PHOSPHATE 10 MG/ML IJ SOLN
INTRAMUSCULAR | Status: AC
  Filled 2022-03-20: qty 1

## 2022-03-20 MED ORDER — DEXMEDETOMIDINE HCL IN NACL 80 MCG/20ML IV SOLN
INTRAVENOUS | Status: DC | PRN
  Administered 2022-03-20: 12 ug via BUCCAL

## 2022-03-20 MED ORDER — LIDOCAINE 2% (20 MG/ML) 5 ML SYRINGE
INTRAMUSCULAR | Status: DC | PRN
  Administered 2022-03-20: 75 mg via INTRAVENOUS

## 2022-03-20 MED ORDER — 0.9 % SODIUM CHLORIDE (POUR BTL) OPTIME
TOPICAL | Status: DC | PRN
  Administered 2022-03-20: 1000 mL

## 2022-03-20 MED ORDER — HYDROMORPHONE HCL 1 MG/ML IJ SOLN
0.5000 mg | INTRAMUSCULAR | Status: DC | PRN
  Administered 2022-03-20: 0.5 mg via INTRAVENOUS

## 2022-03-20 MED ORDER — ACETAMINOPHEN 325 MG PO TABS: 650.0000 mg | ORAL_TABLET | Freq: Four times a day (QID) | ORAL | Status: DC | PRN

## 2022-03-20 SURGICAL SUPPLY — 38 items
APPLIER CLIP ROT 10 11.4 M/L (STAPLE) ×1
BAG COUNTER SPONGE SURGICOUNT (BAG) IMPLANT
CABLE HIGH FREQUENCY MONO STRZ (ELECTRODE) ×1 IMPLANT
CHLORAPREP W/TINT 26 (MISCELLANEOUS) ×1 IMPLANT
CLIP APPLIE ROT 10 11.4 M/L (STAPLE) ×1 IMPLANT
COVER MAYO STAND STRL (DRAPES) IMPLANT
COVER SURGICAL LIGHT HANDLE (MISCELLANEOUS) ×1 IMPLANT
DRAPE C-ARM 42X120 X-RAY (DRAPES) IMPLANT
ELECT REM PT RETURN 15FT ADLT (MISCELLANEOUS) ×1 IMPLANT
GLOVE SURG ORTHO 8.0 STRL STRW (GLOVE) ×1 IMPLANT
GLOVE SURG SYN 7.5  E (GLOVE) ×1
GLOVE SURG SYN 7.5 E (GLOVE) ×1 IMPLANT
GLOVE SURG SYN 7.5 PF PI (GLOVE) ×1 IMPLANT
GOWN STRL REUS W/ TWL XL LVL3 (GOWN DISPOSABLE) ×2 IMPLANT
GOWN STRL REUS W/TWL XL LVL3 (GOWN DISPOSABLE) ×2
HEMOSTAT SNOW SURGICEL 2X4 (HEMOSTASIS) IMPLANT
HEMOSTAT SURGICEL 4X8 (HEMOSTASIS) IMPLANT
IRRIG SUCT STRYKERFLOW 2 WTIP (MISCELLANEOUS) ×1
IRRIGATION SUCT STRKRFLW 2 WTP (MISCELLANEOUS) ×1 IMPLANT
KIT BASIN OR (CUSTOM PROCEDURE TRAY) ×1 IMPLANT
KIT TURNOVER KIT A (KITS) IMPLANT
PAD POSITIONING PINK XL (MISCELLANEOUS) IMPLANT
PENCIL SMOKE EVACUATOR (MISCELLANEOUS) IMPLANT
SCISSORS LAP 5X35 DISP (ENDOMECHANICALS) ×1 IMPLANT
SET CHOLANGIOGRAPH MIX (MISCELLANEOUS) ×1 IMPLANT
SET TUBE SMOKE EVAC HIGH FLOW (TUBING) ×1 IMPLANT
SLEEVE Z-THREAD 5X100MM (TROCAR) ×1 IMPLANT
SPIKE FLUID TRANSFER (MISCELLANEOUS) ×1 IMPLANT
STRIP CLOSURE SKIN 1/2X4 (GAUZE/BANDAGES/DRESSINGS) ×1 IMPLANT
SUT VIC AB 4-0 PS2 27 (SUTURE) ×1 IMPLANT
SYS BAG RETRIEVAL 10MM (BASKET) ×1
SYSTEM BAG RETRIEVAL 10MM (BASKET) ×1 IMPLANT
TAPE CLOTH 4X10 WHT NS (GAUZE/BANDAGES/DRESSINGS) IMPLANT
TOWEL OR 17X26 10 PK STRL BLUE (TOWEL DISPOSABLE) ×1 IMPLANT
TRAY LAPAROSCOPIC (CUSTOM PROCEDURE TRAY) ×1 IMPLANT
TROCAR 11X100 Z THREAD (TROCAR) ×1 IMPLANT
TROCAR BALLN 12MMX100 BLUNT (TROCAR) ×1 IMPLANT
TROCAR Z-THREAD OPTICAL 5X100M (TROCAR) ×1 IMPLANT

## 2022-03-20 NOTE — Progress Notes (Signed)
PROGRESS NOTE Duane Cuevas  WUJ:[id number] DOB: 07-01-1956 DOA: 03/18/2022 PCP: [name], MD   Brief Narrative/Hospital Course: a 66 y.o. male with medical history significant of HTN, HLD, tobacco abuse, COPD, DM2 presented with epigastric pain x onset 3 days PTA and  symptoms worsened 9/15. In the ED, blood work-up showed abnormal LFTs, mild leukocytosis lipase 29 chest x-ray COPD/emphysema, CT abdomen pelvis cholelithiasis with suspected mild choledocholithiasis CBD 8 mm.  Patient admitted GI consulted, MRCP ordered-that was motion artifact, gallstone, no choledocholithiasis, structure noted in the region of head of pancreas at the level of the duodenum diverticulum with mass effect upon the distal common bile duct, hepatic steatosis.  GI reviewed MRCP felt that this is more of a symptomatic cholelithiasis advised surgical consultation for laparoscopic cholecystectomy with IOC        Subjective: Seen and examined.  Complains of still ongoing abdominal pain but somewhat better.  Doing well on room air no chest pain shortness of breath.   Assessment and Plan: Principal Problem:   Biliary obstruction Active Problems:   DM2 (diabetes mellitus, type 2) (HCC)   Cigarette smoker   HTN (hypertension)   COPD (chronic obstructive pulmonary disease) (HCC)   HLD (hyperlipidemia)   Choledocholithiasis with obstruction   Fall   Symptomatic cholelithiasis Abnormal LFTs: CT abdomen pelvis cholelithiasis with suspected mild choledocholithiasis CBD 8 mm>MRCP shows gallstone, no choledocholithiasis,structure noted in the region of head of pancreas at the level of the duodenum diverticulum with mass effect upon the distal common bile duct, hepatic steatosis.  GI reviewed MRCP felt that this is more of a symptomatic cholelithiasis advised surgical consultation for laparoscopic cholecystectomy with IOC.  LFTs shows downtrending AST/T. bili at 1.8 although ALT 120>170.  Leukocytosis  resolved.  Continue empiric ciprofloxacin- defer to CCS if needs broader- but his WBC count has improved .continue IV fluids pain control.  Surgery planning for operative intervention today afternoon.  COPD Tobacco abuse: Not in exacerbation on room air continue Brovana.  Essential hypertension HLD: BP well controlled, holding statin oral meds  T2DM: Last A1c 6.6 on 6/8, blood sugar currently well controlled, continue sliding scale Recent Labs  Lab 03/19/22 1736 03/19/22 2120 03/20/22 0542 03/20/22 0804  GLUCAP 148* 121*  --  107*  HGBA1C  --   --  6.8*  --     DVT prophylaxis: SCD's Start: 03/20/22 0925 SCDs Start: 03/19/22 1614 Code Status:   Code Status: Full Code Family Communication: plan of care discussed with patient at bedside. Patient status is: Inpatient because of symptomatic cholelithiasis Level of care: Med-Surg   Dispo: The patient is from: Home            Anticipated disposition: Home in next 24 to 48 hours  Mobility Assessment (last 72 hours)     Mobility Assessment     Row Name 03/19/22 1537 03/19/22 1533         Does patient have an order for bedrest or is patient medically unstable Yes- Bedfast (Level 1) - Complete Yes- Bedfast (Level 1) - Complete                Objective: Vitals last 24 hrs: Vitals:   03/19/22 1920 03/19/22 2100 03/20/22 0504 03/20/22 0757  BP: 114/76  (!) 115/90   Pulse: 96  86   Resp:   19   Temp: 98.6 F (37 C)  98.7 F (37.1 C)   TempSrc: Oral  Oral   SpO2: 95% 95% 97% 96%  Weight:      Height:       Weight change: -3.359 kg  Physical Examination: General exam: alert awake,older than stated age, weak appearing. HEENT:Oral mucosa moist, Ear/Nose WNL grossly, dentition normal. Respiratory system: bilaterally clear BS, no use of accessory muscle Cardiovascular system: S1 & S2 +, No JVD. Gastrointestinal system: Abdomen soft, tender on mid to right abdomen,ND, BS+ Nervous System:Alert, awake, moving extremities  and grossly nonfocal Extremities: LE edema neg,distal peripheral pulses palpable.  Skin: No rashes,no icterus. MSK: Normal muscle bulk,tone, power  Medications reviewed:  Scheduled Meds:  arformoterol  15 mcg Nebulization BID   And   umeclidinium bromide  1 puff Inhalation Daily   Chlorhexidine Gluconate Cloth  6 each Topical Once   And   Chlorhexidine Gluconate Cloth  6 each Topical Once   insulin aspart  0-9 Units Subcutaneous TID WC   Continuous Infusions:  sodium chloride Stopped (03/20/22 0428)   sodium chloride 100 mL/hr at 03/20/22 0511   ciprofloxacin 400 mg (03/20/22 0950)   lactated ringers Stopped (03/19/22 1355)      Diet Order             Diet NPO time specified  Diet effective midnight                            Intake/Output Summary (Last 24 hours) at 03/20/2022 1014 Last data filed at 03/20/2022 0600 Gross per 24 hour  Intake 2053.69 ml  Output 1401 ml  Net 652.69 ml   Net IO Since Admission: 652.69 mL [03/20/22 1014]  Wt Readings from Last 3 Encounters:  03/19/22 71.4 kg  03/14/22 74.8 kg  12/22/21 72.2 kg     Unresulted Labs (From admission, onward)    None     Data Reviewed: I have personally reviewed following labs and imaging studies CBC: Recent Labs  Lab 03/18/22 2206 03/20/22 0542  WBC 13.3* 6.9  HGB 15.7 15.0  HCT 47.0 45.2  MCV 90.7 90.9  PLT 279 456   Basic Metabolic Panel: Recent Labs  Lab 03/18/22 2206 03/20/22 0542  NA 135 136  K 4.0 4.1  CL 103 103  CO2 23 25  GLUCOSE 108* 98  BUN 13 11  CREATININE 0.74 0.70  CALCIUM 9.1 8.9   GFR: Estimated Creatinine Clearance: 87.9 mL/min (by C-G formula based on SCr of 0.7 mg/dL). Liver Function Tests: Recent Labs  Lab 03/19/22 0239 03/20/22 0542  AST 144* 122*  ALT 120* 170*  ALKPHOS 291* 313*  BILITOT 3.0* 1.8*  PROT 7.8 6.9  ALBUMIN 4.0 3.2*   Recent Labs  Lab 03/19/22 0239  LIPASE 29   No results for input(s): "AMMONIA" in the last 168  hours. Coagulation Profile: No results for input(s): "INR", "PROTIME" in the last 168 hours. BNP (last 3 results) No results for input(s): "PROBNP" in the last 8760 hours. HbA1C: Recent Labs    03/20/22 0542  HGBA1C 6.8*   CBG: Recent Labs  Lab 03/19/22 1736 03/19/22 2120 03/20/22 0804  GLUCAP 148* 121* 107*   Lipid Profile: No results for input(s): "CHOL", "HDL", "LDLCALC", "TRIG", "CHOLHDL", "LDLDIRECT" in the last 72 hours. Thyroid Function Tests: No results for input(s): "TSH", "T4TOTAL", "FREET4", "T3FREE", "THYROIDAB" in the last 72 hours. Sepsis Labs: No results for input(s): "PROCALCITON", "LATICACIDVEN" in the last 168 hours.  No results found for this or any previous visit (from the past 240 hour(s)).  Antimicrobials: Anti-infectives (From admission, onward)  Start     Dose/Rate Route Frequency Ordered Stop   03/19/22 1000  ciprofloxacin (CIPRO) IVPB 400 mg        400 mg 200 mL/hr over 60 Minutes Intravenous Every 12 hours 03/19/22 0921        Culture/Microbiology    Component Value Date/Time   SDES SPUTUM 10/21/2020 0604   SDES  10/21/2020 0604    SPUTUM Performed at [facility], [place] [address], [place], [place] [address]    [place] 10/21/2020 0604   SPECREQUEST  10/21/2020 0604    NONE Reflexed from [id number] Performed at [facility], [place] [address], [place], [place] [address]    CULT  10/21/2020 3875    FEW Consistent with normal respiratory flora. No Pseudomonas species isolated Performed at [facility] [address] [place], [place] [address]    REPTSTATUS 10/21/2020 FINAL 10/21/2020 0604   REPTSTATUS 10/23/2020 FINAL 10/21/2020 0604    Other culture-see note  Radiology Studies: MR ABDOMEN MRCP W WO CONTAST  Result Date: 03/19/2022 CLINICAL DATA:  Acute hepatitis. Elevated LFTs with possible choledocholithiasis. EXAM: MRI ABDOMEN WITHOUT AND WITH CONTRAST (INCLUDING MRCP) TECHNIQUE:  Multiplanar multisequence MR imaging of the abdomen was performed both before and after the administration of intravenous contrast. Heavily T2-weighted images of the biliary and pancreatic ducts were obtained, and three-dimensional MRCP images were rendered by post processing. CONTRAST:  7.18m GADAVIST GADOBUTROL 1 MMOL/ML IV SOLN COMPARISON:  CT from 03/19/2022 FINDINGS: Exam detail is diminished due to considerable motion artifact. Lower chest: No acute abnormality. Hepatobiliary: Hepatic steatosis is identified. There is scratch set assessment for underlying enhancing liver lesions is significantly diminished by motion artifact. No large enhancing liver masses identified. Small stones are noted layering within the dependent portion of the gallbladder which measure up to 8 mm, image 15/8. No significant gallbladder wall thickening, pericholecystic inflammation or free fluid. Mild intrahepatic bile duct dilatation, image 14/5. The common bile duct measures 8 mm in maximum diameter. There is no convincing evidence for choledocholithiasis. There is a duodenal diverticulum which wraps around the distal common bile duct, image 24/9 and image 14/8. On the coronal T2 weighted images there is a structure which exerts mass effect upon the distal common bile duct measuring approximately 11 mm, image 14/8. Although limited by motion artifact, this appears to represent portions of the duodenal diverticula. Pancreas:  No main duct dilatation, inflammation or mass. Spleen:  Within normal limits in size and appearance. Adrenals/Urinary Tract: Normal adrenal glands. No kidney mass or hydronephrosis identified. Stomach/Bowel: Stomach appears nondistended. 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 No dilated loops of large or small bowel. No signs of bowel wall thickening or inflammation. Vascular/Lymphatic: Aortic atherosclerotic calcifications. No aneurysm. The upper abdominal vascularity appears patent.  No abdominal adenopathy. Other:  No significant free fluid or fluid collections. Musculoskeletal: No suspicious bone lesions identified. IMPRESSION: 1. Exam detail is diminished due to motion artifact. 2. Gallstones. No evidence for choledocholithiasis. 3. There is a structure within the region of the head of pancreas at the level of the duodenal diverticulum which has mass effect upon the distal common bile duct. The duodenal diverticulum extends around the distal common bile duct at this level. According to the literature there have been reports of extra and intrahepatic bile duct dilatation secondary to compression of the intrapancreatic portions of the common bile duct by a periampullary diverticula. As this study is limited by excessive motion artifact,  when the patient is clinically stable and able to remain motionless follow-up imaging is advised to exclude underlying mass lesion in this area. 4. Hepatic steatosis. Electronically Signed   By: Kerby Moors M.D.   On: 03/19/2022 12:43   MR 3D Recon At Scanner  Result Date: 03/19/2022 CLINICAL DATA:  Acute hepatitis. Elevated LFTs with possible choledocholithiasis. EXAM: MRI ABDOMEN WITHOUT AND WITH CONTRAST (INCLUDING MRCP) TECHNIQUE: Multiplanar multisequence MR imaging of the abdomen was performed both before and after the administration of intravenous contrast. Heavily T2-weighted images of the biliary and pancreatic ducts were obtained, and three-dimensional MRCP images were rendered by post processing. CONTRAST:  7.59m GADAVIST GADOBUTROL 1 MMOL/ML IV SOLN COMPARISON:  CT from 03/19/2022 FINDINGS: Exam detail is diminished due to considerable motion artifact. Lower chest: No acute abnormality. Hepatobiliary: Hepatic steatosis is identified. There is scratch set assessment for underlying enhancing liver lesions is significantly diminished by motion artifact. No large enhancing liver masses identified. Small stones are noted layering within the  dependent portion of the gallbladder which measure up to 8 mm, image 15/8. No significant gallbladder wall thickening, pericholecystic inflammation or free fluid. Mild intrahepatic bile duct dilatation, image 14/5. The common bile duct measures 8 mm in maximum diameter. There is no convincing evidence for choledocholithiasis. There is a duodenal diverticulum which wraps around the distal common bile duct, image 24/9 and image 14/8. On the coronal T2 weighted images there is a structure which exerts mass effect upon the distal common bile duct measuring approximately 11 mm, image 14/8. Although limited by motion artifact, this appears to represent portions of the duodenal diverticula. Pancreas:  No main duct dilatation, inflammation or mass. Spleen:  Within normal limits in size and appearance. Adrenals/Urinary Tract: Normal adrenal glands. No kidney mass or hydronephrosis identified. Stomach/Bowel: Stomach appears nondistended. 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 No dilated loops of large or small bowel. No signs of bowel wall thickening or inflammation. Vascular/Lymphatic: Aortic atherosclerotic calcifications. No aneurysm. The upper abdominal vascularity appears patent. No abdominal adenopathy. Other:  No significant free fluid or fluid collections. Musculoskeletal: No suspicious bone lesions identified. IMPRESSION: 1. Exam detail is diminished due to motion artifact. 2. Gallstones. No evidence for choledocholithiasis. 3. There is a structure within the region of the head of pancreas at the level of the duodenal diverticulum which has mass effect upon the distal common bile duct. The duodenal diverticulum extends around the distal common bile duct at this level. According to the literature there have been reports of extra and intrahepatic bile duct dilatation secondary to compression of the intrapancreatic portions of the common bile duct by a periampullary diverticula. As this  study is limited by excessive motion artifact, when the patient is clinically stable and able to remain motionless follow-up imaging is advised to exclude underlying mass lesion in this area. 4. Hepatic steatosis. Electronically Signed   By: TKerby MoorsM.D.   On: 03/19/2022 12:43   CT T-SPINE NO CHARGE  Result Date: 03/19/2022 CLINICAL DATA:  Trauma/fall, chest pain EXAM: CT Thoracic and Lumbar spine with contrast TECHNIQUE: Multiplanar CT images of the thoracic and lumbar spine were reconstructed from contemporary CT of the Chest, Abdomen, and Pelvis. RADIATION DOSE REDUCTION: This exam was performed according to the departmental dose-optimization program which includes automated exposure control, adjustment of the mA and/or kV according to patient size and/or use of iterative reconstruction technique. CONTRAST:  No additional COMPARISON:  Concurrent CT chest abdomen pelvis FINDINGS: CT  THORACIC SPINE FINDINGS Alignment: Normal thoracic kyphosis. Vertebrae: No acute fracture or focal pathologic process. Paraspinal and other soft tissues: Evaluated on dedicated CT chest. Disc levels: Mild degenerative changes of the lower thoracic spine. Spinal canal is patent. CT LUMBAR SPINE FINDINGS Segmentation: 5 lumbar type vertebral bodies. Alignment: Normal lumbar lordosis. Vertebrae: No acute fracture or focal pathologic process. Paraspinal and other soft tissues: Evaluated on dedicated CT abdomen/pelvis. Disc levels: Mild degenerative changes.  Spinal canal is patent. IMPRESSION: No traumatic injury to the thoracic or lumbar spine. Mild degenerative changes. Electronically Signed   By: Julian Hy M.D.   On: 03/19/2022 03:51   CT L-SPINE NO CHARGE  Result Date: 03/19/2022 CLINICAL DATA:  Trauma/fall, chest pain EXAM: CT Thoracic and Lumbar spine with contrast TECHNIQUE: Multiplanar CT images of the thoracic and lumbar spine were reconstructed from contemporary CT of the Chest, Abdomen, and Pelvis.  RADIATION DOSE REDUCTION: This exam was performed according to the departmental dose-optimization program which includes automated exposure control, adjustment of the mA and/or kV according to patient size and/or use of iterative reconstruction technique. CONTRAST:  No additional COMPARISON:  Concurrent CT chest abdomen pelvis FINDINGS: CT THORACIC SPINE FINDINGS Alignment: Normal thoracic kyphosis. Vertebrae: No acute fracture or focal pathologic process. Paraspinal and other soft tissues: Evaluated on dedicated CT chest. Disc levels: Mild degenerative changes of the lower thoracic spine. Spinal canal is patent. CT LUMBAR SPINE FINDINGS Segmentation: 5 lumbar type vertebral bodies. Alignment: Normal lumbar lordosis. Vertebrae: No acute fracture or focal pathologic process. Paraspinal and other soft tissues: Evaluated on dedicated CT abdomen/pelvis. Disc levels: Mild degenerative changes.  Spinal canal is patent. IMPRESSION: No traumatic injury to the thoracic or lumbar spine. Mild degenerative changes. Electronically Signed   By: Julian Hy M.D.   On: 03/19/2022 03:51   CT CHEST ABDOMEN PELVIS W CONTRAST  Result Date: 03/19/2022 CLINICAL DATA:  Fall, chest pain EXAM: CT CHEST, ABDOMEN, AND PELVIS WITH CONTRAST TECHNIQUE: Multidetector CT imaging of the chest, abdomen and pelvis was performed following the standard protocol during bolus administration of intravenous contrast. RADIATION DOSE REDUCTION: This exam was performed according to the departmental dose-optimization program which includes automated exposure control, adjustment of the mA and/or kV according to patient size and/or use of iterative reconstruction technique. CONTRAST:  126m OMNIPAQUE IOHEXOL 300 MG/ML  SOLN COMPARISON:  CTA chest abdomen pelvis dated 10/19/2021 FINDINGS: CT CHEST FINDINGS Cardiovascular: No evidence of traumatic aortic injury. Mild atherosclerotic calcifications of the arch. Heart is normal in size.  No pericardial  effusion. Mediastinum/Nodes: No evidence of intra mediastinal hematoma. Small mediastinal lymph nodes not meet pathologic CT size criteria. Visualized thyroid is unremarkable. Lungs/Pleura: Moderate centrilobular and paraseptal emphysematous changes, upper lung predominant. No focal consolidation or aspiration. No suspicious pulmonary nodules. No pleural effusion or pneumothorax. Musculoskeletal: No fracture is seen. Sternum, clavicles, and scapulae are intact. Bilateral ribs are intact. Dedicated thoracic spine evaluation has been performed and will be reported separately. CT ABDOMEN PELVIS FINDINGS Hepatobiliary: Liver is within normal limits. No perihepatic fluid/hemorrhage. Mild layering gallstones (series 2/image 70). Mild central intrahepatic ductal prominence. Common duct measures 8 mm, at the upper limits of normal. Although poorly visualized on CT, a small distal CBD stone is suspected (series 2/image 75). Pancreas: Within normal limits. Spleen: Within normal limits. Adrenals/Urinary Tract: Adrenal glands are within normal limits. Subcentimeter medial left upper pole renal cyst (series 2/image 68), measuring simple fluid density, benign (Bosniak I). No follow-up is recommended. Right kidney is within normal limits.  No hydronephrosis. Mildly thick-walled bladder, although underdistended. Stomach/Bowel: Stomach is within normal limits. No evidence of bowel obstruction. Duodenal diverticulum. Normal appendix (series 2/image 36). Sigmoid diverticulosis, without evidence of diverticulitis. Vascular/Lymphatic: No evidence of abdominal aortic aneurysm. 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 Atherosclerotic calcifications of the abdominal aorta and branch vessels. No suspicious abdominopelvic lymphadenopathy. Reproductive: Prostate is unremarkable. Other: No abdominopelvic ascites. No hemoperitoneum or free air. Musculoskeletal: No fracture is seen. Visualized bony pelvis and bilateral  proximal femurs are intact. Old left superior and inferior pubic rami fracture deformities. Dedicated lumbar spine evaluation has been performed and will be reported separately. IMPRESSION: No evidence of acute traumatic injury to the chest, abdomen, or pelvis. Cholelithiasis with suspected mild choledocholithiasis. Associated mild central intrahepatic ductal dilatation. Common duct measures 8 mm, at the upper limits of normal. Correlate with LFTs and consider ERCP as clinically warranted. Dedicated thoracolumbar spine evaluation has been performed and will be reported separately. Electronically Signed   By: Julian Hy M.D.   On: 03/19/2022 03:49   DG Chest 2 View  Result Date: 03/18/2022 CLINICAL DATA:  Chest pain.  Status post fall. EXAM: CHEST - 2 VIEW COMPARISON:  12/08/2021 FINDINGS: Heart size and mediastinal contours are unremarkable. There is no pleural effusion or edema. No airspace opacities identified. Chronic interstitial changes of COPD/emphysema noted. Visualized osseous structures appear intact. IMPRESSION: 1. No acute cardiopulmonary abnormalities. 2. COPD/emphysema. Electronically Signed   By: Kerby Moors M.D.   On: 03/18/2022 20:54     LOS: 1 day   Antonieta Pert, MD [facility]  03/20/2022, 10:14 AM

## 2022-03-20 NOTE — Transfer of Care (Signed)
Immediate Anesthesia Transfer of Care Note  Patient: Duane Cuevas  Procedure(s) Performed: LAPAROSCOPIC CHOLECYSTECTOMY (Abdomen) INTRAOPERATIVE CHOLANGIOGRAM (Abdomen)  Patient Location: PACU  Anesthesia Type:General  Level of Consciousness: awake, alert  and oriented  Airway & Oxygen Therapy: Patient Spontanous Breathing and Patient connected to face mask oxygen  Post-op Assessment: Report given to RN and Post -op Vital signs reviewed and stable  Post vital signs: Reviewed and stable  Last Vitals:  Vitals Value Taken Time  BP 125/80 03/20/22 1334  Temp    Pulse 79 03/20/22 1342  Resp 14 03/20/22 1342  SpO2 100 % 03/20/22 1342  Vitals shown include unvalidated device data.  Last Pain:  Vitals:   03/20/22 1129  TempSrc: Oral  PainSc:          Complications: No notable events documented.

## 2022-03-20 NOTE — Hospital Course (Addendum)
a 66 y.o. male with medical history significant of HTN, HLD, tobacco abuse, COPD, DM2 presented with epigastric pain x onset 3 days PTA and  symptoms worsened 9/15. In the ED, blood work-up showed abnormal LFTs, mild leukocytosis lipase 29 chest x-ray COPD/emphysema, CT abdomen pelvis cholelithiasis with suspected mild choledocholithiasis CBD 8 mm.  Patient admitted GI consulted, MRCP ordered-that was motion artifact, gallstone, no choledocholithiasis, structure noted in the region of head of pancreas at the level of the duodenum diverticulum with mass effect upon the distal common bile duct, hepatic steatosis.  GI reviewed MRCP felt that this is more of a symptomatic cholelithiasis advised surgical consultation for laparoscopic cholecystectomy with IOC

## 2022-03-20 NOTE — Progress Notes (Signed)
Patient returned to unit. Call bell with in reach questions answered.

## 2022-03-20 NOTE — Progress Notes (Signed)
Subjective/Chief Complaint: Complains of epigastric abdominal pain   Objective: Vital signs in last 24 hours: Temp:  [98.6 F (37 C)-99 F (37.2 C)] 98.7 F (37.1 C) (09/17 0504) Pulse Rate:  [84-96] 86 (09/17 0504) Resp:  [16-19] 19 (09/17 0504) BP: (114-127)/(75-90) 115/90 (09/17 0504) SpO2:  [94 %-97 %] 96 % (09/17 0757) Weight:  [71.4 kg] 71.4 kg (09/16 1550) Last BM Date : 03/19/22  Intake/Output from previous day: 09/16 0701 - 09/17 0700 In: 2053.7 [I.V.:1793.1; IV Piggyback:260.6] Out: 1401 [Urine:1401] Intake/Output this shift: No intake/output data recorded.  General appearance: alert and cooperative Resp: clear to auscultation bilaterally Cardio: Normal sinus rhythm GI: Tender epigastric and right upper quadrant to palpation.  Lab Results:  Recent Labs    03/18/22 2206 03/20/22 0542  WBC 13.3* 6.9  HGB 15.7 15.0  HCT 47.0 45.2  PLT 279 283   BMET Recent Labs    03/18/22 2206 03/20/22 0542  NA 135 136  K 4.0 4.1  CL 103 103  CO2 23 25  GLUCOSE 108* 98  BUN 13 11  CREATININE 0.74 0.70  CALCIUM 9.1 8.9   PT/INR No results for input(s): "LABPROT", "INR" in the last 72 hours. ABG No results for input(s): "PHART", "HCO3" in the last 72 hours.  Invalid input(s): "PCO2", "PO2"  Studies/Results: MR ABDOMEN MRCP W WO CONTAST  Result Date: 03/19/2022 CLINICAL DATA:  Acute hepatitis. Elevated LFTs with possible choledocholithiasis. EXAM: MRI ABDOMEN WITHOUT AND WITH CONTRAST (INCLUDING MRCP) TECHNIQUE: Multiplanar multisequence MR imaging of the abdomen was performed both before and after the administration of intravenous contrast. Heavily T2-weighted images of the biliary and pancreatic ducts were obtained, and three-dimensional MRCP images were rendered by post processing. CONTRAST:  7.74m GADAVIST GADOBUTROL 1 MMOL/ML IV SOLN COMPARISON:  CT from 03/19/2022 FINDINGS: Exam detail is diminished due to considerable motion artifact. Lower chest: No  acute abnormality. Hepatobiliary: Hepatic steatosis is identified. There is scratch set assessment for underlying enhancing liver lesions is significantly diminished by motion artifact. No large enhancing liver masses identified. Small stones are noted layering within the dependent portion of the gallbladder which measure up to 8 mm, image 15/8. No significant gallbladder wall thickening, pericholecystic inflammation or free fluid. Mild intrahepatic bile duct dilatation, image 14/5. The common bile duct measures 8 mm in maximum diameter. There is no convincing evidence for choledocholithiasis. There is a duodenal diverticulum which wraps around the distal common bile duct, image 24/9 and image 14/8. On the coronal T2 weighted images there is a structure which exerts mass effect upon the distal common bile duct measuring approximately 11 mm, image 14/8. Although limited by motion artifact, this appears to represent portions of the duodenal diverticula. Pancreas:  No main duct dilatation, inflammation or mass. Spleen:  Within normal limits in size and appearance. Adrenals/Urinary Tract: Normal adrenal glands. No kidney mass or hydronephrosis identified. Stomach/Bowel: Stomach appears nondistended. 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 No dilated loops of large or small bowel. No signs of bowel wall thickening or inflammation. Vascular/Lymphatic: Aortic atherosclerotic calcifications. No aneurysm. The upper abdominal vascularity appears patent. No abdominal adenopathy. Other:  No significant free fluid or fluid collections. Musculoskeletal: No suspicious bone lesions identified. IMPRESSION: 1. Exam detail is diminished due to motion artifact. 2. Gallstones. No evidence for choledocholithiasis. 3. There is a structure within the region of the head of pancreas at the level of the duodenal diverticulum which has mass effect upon the distal common bile duct.  The duodenal diverticulum extends  around the distal common bile duct at this level. According to the literature there have been reports of extra and intrahepatic bile duct dilatation secondary to compression of the intrapancreatic portions of the common bile duct by a periampullary diverticula. As this study is limited by excessive motion artifact, when the patient is clinically stable and able to remain motionless follow-up imaging is advised to exclude underlying mass lesion in this area. 4. Hepatic steatosis. Electronically Signed   By: Kerby Moors M.D.   On: 03/19/2022 12:43   MR 3D Recon At Scanner  Result Date: 03/19/2022 CLINICAL DATA:  Acute hepatitis. Elevated LFTs with possible choledocholithiasis. EXAM: MRI ABDOMEN WITHOUT AND WITH CONTRAST (INCLUDING MRCP) TECHNIQUE: Multiplanar multisequence MR imaging of the abdomen was performed both before and after the administration of intravenous contrast. Heavily T2-weighted images of the biliary and pancreatic ducts were obtained, and three-dimensional MRCP images were rendered by post processing. CONTRAST:  7.93m GADAVIST GADOBUTROL 1 MMOL/ML IV SOLN COMPARISON:  CT from 03/19/2022 FINDINGS: Exam detail is diminished due to considerable motion artifact. Lower chest: No acute abnormality. Hepatobiliary: Hepatic steatosis is identified. There is scratch set assessment for underlying enhancing liver lesions is significantly diminished by motion artifact. No large enhancing liver masses identified. Small stones are noted layering within the dependent portion of the gallbladder which measure up to 8 mm, image 15/8. No significant gallbladder wall thickening, pericholecystic inflammation or free fluid. Mild intrahepatic bile duct dilatation, image 14/5. The common bile duct measures 8 mm in maximum diameter. There is no convincing evidence for choledocholithiasis. There is a duodenal diverticulum which wraps around the distal common bile duct, image 24/9 and image 14/8. On the coronal T2  weighted images there is a structure which exerts mass effect upon the distal common bile duct measuring approximately 11 mm, image 14/8. Although limited by motion artifact, this appears to represent portions of the duodenal diverticula. Pancreas:  No main duct dilatation, inflammation or mass. Spleen:  Within normal limits in size and appearance. Adrenals/Urinary Tract: Normal adrenal glands. No kidney mass or hydronephrosis identified. Stomach/Bowel: Stomach appears nondistended. 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 No dilated loops of large or small bowel. No signs of bowel wall thickening or inflammation. Vascular/Lymphatic: Aortic atherosclerotic calcifications. No aneurysm. The upper abdominal vascularity appears patent. No abdominal adenopathy. Other:  No significant free fluid or fluid collections. Musculoskeletal: No suspicious bone lesions identified. IMPRESSION: 1. Exam detail is diminished due to motion artifact. 2. Gallstones. No evidence for choledocholithiasis. 3. There is a structure within the region of the head of pancreas at the level of the duodenal diverticulum which has mass effect upon the distal common bile duct. The duodenal diverticulum extends around the distal common bile duct at this level. According to the literature there have been reports of extra and intrahepatic bile duct dilatation secondary to compression of the intrapancreatic portions of the common bile duct by a periampullary diverticula. As this study is limited by excessive motion artifact, when the patient is clinically stable and able to remain motionless follow-up imaging is advised to exclude underlying mass lesion in this area. 4. Hepatic steatosis. Electronically Signed   By: TKerby MoorsM.D.   On: 03/19/2022 12:43   CT T-SPINE NO CHARGE  Result Date: 03/19/2022 CLINICAL DATA:  Trauma/fall, chest pain EXAM: CT Thoracic and Lumbar spine with contrast TECHNIQUE: Multiplanar CT images  of the thoracic and lumbar spine were reconstructed from contemporary  CT of the Chest, Abdomen, and Pelvis. RADIATION DOSE REDUCTION: This exam was performed according to the departmental dose-optimization program which includes automated exposure control, adjustment of the mA and/or kV according to patient size and/or use of iterative reconstruction technique. CONTRAST:  No additional COMPARISON:  Concurrent CT chest abdomen pelvis FINDINGS: CT THORACIC SPINE FINDINGS Alignment: Normal thoracic kyphosis. Vertebrae: No acute fracture or focal pathologic process. Paraspinal and other soft tissues: Evaluated on dedicated CT chest. Disc levels: Mild degenerative changes of the lower thoracic spine. Spinal canal is patent. CT LUMBAR SPINE FINDINGS Segmentation: 5 lumbar type vertebral bodies. Alignment: Normal lumbar lordosis. Vertebrae: No acute fracture or focal pathologic process. Paraspinal and other soft tissues: Evaluated on dedicated CT abdomen/pelvis. Disc levels: Mild degenerative changes.  Spinal canal is patent. IMPRESSION: No traumatic injury to the thoracic or lumbar spine. Mild degenerative changes. Electronically Signed   By: Julian Hy M.D.   On: 03/19/2022 03:51   CT L-SPINE NO CHARGE  Result Date: 03/19/2022 CLINICAL DATA:  Trauma/fall, chest pain EXAM: CT Thoracic and Lumbar spine with contrast TECHNIQUE: Multiplanar CT images of the thoracic and lumbar spine were reconstructed from contemporary CT of the Chest, Abdomen, and Pelvis. RADIATION DOSE REDUCTION: This exam was performed according to the departmental dose-optimization program which includes automated exposure control, adjustment of the mA and/or kV according to patient size and/or use of iterative reconstruction technique. CONTRAST:  No additional COMPARISON:  Concurrent CT chest abdomen pelvis FINDINGS: CT THORACIC SPINE FINDINGS Alignment: Normal thoracic kyphosis. Vertebrae: No acute fracture or focal pathologic process.  Paraspinal and other soft tissues: Evaluated on dedicated CT chest. Disc levels: Mild degenerative changes of the lower thoracic spine. Spinal canal is patent. CT LUMBAR SPINE FINDINGS Segmentation: 5 lumbar type vertebral bodies. Alignment: Normal lumbar lordosis. Vertebrae: No acute fracture or focal pathologic process. Paraspinal and other soft tissues: Evaluated on dedicated CT abdomen/pelvis. Disc levels: Mild degenerative changes.  Spinal canal is patent. IMPRESSION: No traumatic injury to the thoracic or lumbar spine. Mild degenerative changes. Electronically Signed   By: Julian Hy M.D.   On: 03/19/2022 03:51   CT CHEST ABDOMEN PELVIS W CONTRAST  Result Date: 03/19/2022 CLINICAL DATA:  Fall, chest pain EXAM: CT CHEST, ABDOMEN, AND PELVIS WITH CONTRAST TECHNIQUE: Multidetector CT imaging of the chest, abdomen and pelvis was performed following the standard protocol during bolus administration of intravenous contrast. RADIATION DOSE REDUCTION: This exam was performed according to the departmental dose-optimization program which includes automated exposure control, adjustment of the mA and/or kV according to patient size and/or use of iterative reconstruction technique. CONTRAST:  166m OMNIPAQUE IOHEXOL 300 MG/ML  SOLN COMPARISON:  CTA chest abdomen pelvis dated 10/19/2021 FINDINGS: CT CHEST FINDINGS Cardiovascular: No evidence of traumatic aortic injury. Mild atherosclerotic calcifications of the arch. Heart is normal in size.  No pericardial effusion. Mediastinum/Nodes: No evidence of intra mediastinal hematoma. Small mediastinal lymph nodes not meet pathologic CT size criteria. Visualized thyroid is unremarkable. Lungs/Pleura: Moderate centrilobular and paraseptal emphysematous changes, upper lung predominant. No focal consolidation or aspiration. No suspicious pulmonary nodules. No pleural effusion or pneumothorax. Musculoskeletal: No fracture is seen. Sternum, clavicles, and scapulae are  intact. Bilateral ribs are intact. Dedicated thoracic spine evaluation has been performed and will be reported separately. CT ABDOMEN PELVIS FINDINGS Hepatobiliary: Liver is within normal limits. No perihepatic fluid/hemorrhage. Mild layering gallstones (series 2/image 70). Mild central intrahepatic ductal prominence. Common duct measures 8 mm, at the upper limits of normal. Although poorly visualized on  CT, a small distal CBD stone is suspected (series 2/image 75). Pancreas: Within normal limits. Spleen: Within normal limits. Adrenals/Urinary Tract: Adrenal glands are within normal limits. Subcentimeter medial left upper pole renal cyst (series 2/image 68), measuring simple fluid density, benign (Bosniak I). No follow-up is recommended. Right kidney is within normal limits. No hydronephrosis. Mildly thick-walled bladder, although underdistended. Stomach/Bowel: Stomach is within normal limits. No evidence of bowel obstruction. Duodenal diverticulum. Normal appendix (series 2/image 36). Sigmoid diverticulosis, without evidence of diverticulitis. Vascular/Lymphatic: No evidence of abdominal aortic aneurysm. 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 Atherosclerotic calcifications of the abdominal aorta and branch vessels. No suspicious abdominopelvic lymphadenopathy. Reproductive: Prostate is unremarkable. Other: No abdominopelvic ascites. No hemoperitoneum or free air. Musculoskeletal: No fracture is seen. Visualized bony pelvis and bilateral proximal femurs are intact. Old left superior and inferior pubic rami fracture deformities. Dedicated lumbar spine evaluation has been performed and will be reported separately. IMPRESSION: No evidence of acute traumatic injury to the chest, abdomen, or pelvis. Cholelithiasis with suspected mild choledocholithiasis. Associated mild central intrahepatic ductal dilatation. Common duct measures 8 mm, at the upper limits of normal. Correlate with LFTs and  consider ERCP as clinically warranted. Dedicated thoracolumbar spine evaluation has been performed and will be reported separately. Electronically Signed   By: Julian Hy M.D.   On: 03/19/2022 03:49   DG Chest 2 View  Result Date: 03/18/2022 CLINICAL DATA:  Chest pain.  Status post fall. EXAM: CHEST - 2 VIEW COMPARISON:  12/08/2021 FINDINGS: Heart size and mediastinal contours are unremarkable. There is no pleural effusion or edema. No airspace opacities identified. Chronic interstitial changes of COPD/emphysema noted. Visualized osseous structures appear intact. IMPRESSION: 1. No acute cardiopulmonary abnormalities. 2. COPD/emphysema. Electronically Signed   By: Kerby Moors M.D.   On: 03/18/2022 20:54    Anti-infectives: Anti-infectives (From admission, onward)    Start     Dose/Rate Route Frequency Ordered Stop   03/19/22 1000  ciprofloxacin (CIPRO) IVPB 400 mg        400 mg 200 mL/hr over 60 Minutes Intravenous Every 12 hours 03/19/22 5621         Assessment/Plan: Acute cholecystitis with questionable choledocholithiasis  Liver function studies are improved but not normal.  Recommend laparoscopic cholecystectomy with cholangiogram.  Case discussed with Dr. Harlow Asa.  I discussed this with the patient and his daughter on the phone this morning together.  I explained procedure to him.  I discussed possible postoperative ERCP depending on intraoperative findings.  I discussed laparoscopic approach to gallbladder removal.  Discussed risks and benefits with the help of his daughter and answered all questions to the best of my ability.The procedure has been discussed with the patient. Operative and non operative treatments have been discussed. Risks of surgery include bleeding, infection,  Common bile duct injury,  Injury to the stomach,liver, colon,small intestine, abdominal wall,  Diaphragm,  Major blood vessels,  And the need for an open procedure.  Other risks include worsening of  medical problems, death,  DVT and pulmonary embolism, and cardiovascular events.   Medical options have also been discussed. The patient has been informed of long term expectations of surgery and non surgical options,  The patient agrees to proceed.      LOS: 1 day  Total time 30 minutes for documentation, face-to-face time, preparation for OR, review of labs, x-rays, vital signs of last 24 hours  moderate complexity  Turner Daniels MD  03/20/2022

## 2022-03-20 NOTE — Interval H&P Note (Signed)
History and Physical Interval Note:  03/20/2022 12:01 PM  Duane Cuevas  has presented today for surgery, with the diagnosis of Acute Cholecystitis.  The various methods of treatment have been discussed with the patient and family. After consideration of risks, benefits and other options for treatment, the patient has consented to    Procedure(s): LAPAROSCOPIC CHOLECYSTECTOMY (N/A) INTRAOPERATIVE CHOLANGIOGRAM (N/A) as a surgical intervention.    The patient's history has been reviewed, patient examined, no change in status, stable for surgery.  I have reviewed the patient's chart and labs.  Questions were answered to the patient's satisfaction.    Armandina Gemma, [facility] Office: [place]

## 2022-03-20 NOTE — Anesthesia Postprocedure Evaluation (Signed)
Anesthesia Post Note  Patient: Duane Cuevas  Procedure(s) Performed: LAPAROSCOPIC CHOLECYSTECTOMY (Abdomen) INTRAOPERATIVE CHOLANGIOGRAM (Abdomen)     Patient location during evaluation: PACU Anesthesia Type: General Level of consciousness: sedated and patient cooperative Pain management: pain level controlled Vital Signs Assessment: post-procedure vital signs reviewed and stable Respiratory status: spontaneous breathing Cardiovascular status: stable Anesthetic complications: no   No notable events documented.  Last Vitals:  Vitals:   03/20/22 1418 03/20/22 1448  BP: (!) 116/94 110/71  Pulse: 86 81  Resp: 14   Temp: 36.6 C 36.7 C  SpO2: 98% 99%    Last Pain:  Vitals:   03/20/22 1448  TempSrc: Oral  PainSc:                  [name]

## 2022-03-20 NOTE — Anesthesia Preprocedure Evaluation (Addendum)
Anesthesia Evaluation  Patient identified by MRN, date of birth, ID band Patient awake    Reviewed: Allergy & Precautions, NPO status , Patient's Chart, lab work & pertinent test results  History of Anesthesia Complications Negative for: history of anesthetic complications  Airway Mallampati: II  TM Distance: >3 FB Neck ROM: Full    Dental  (+) Edentulous Upper, Edentulous Lower, Dental Advisory Given   Pulmonary COPD,  COPD inhaler, Current Smoker, former smoker,    Pulmonary exam normal breath sounds clear to auscultation       Cardiovascular hypertension, Pt. on medications pulmonary hypertension+ CAD and + Past MI  Normal cardiovascular exam Rhythm:Regular Rate:Normal  Echo 04/2020 1. Left ventricular ejection fraction, by estimation, is 60 to 65%. The left ventricle has normal function. The left ventricle has no regional wall motion abnormalities. There is mild concentric left ventricular hypertrophy. Left ventricular diastolic parameters are consistent with Grade I diastolic dysfunction (impaired relaxation).  2. Right ventricular systolic function is normal. The right ventricular size is normal. There is moderately elevated pulmonary artery systolic pressure. The estimated right ventricular systolic pressure is 13.2 mmHg.  3. The mitral valve is normal in structure. No evidence of mitral valve regurgitation. No evidence of mitral stenosis.  4. The aortic valve is normal in structure. Aortic valve regurgitation is not visualized. No aortic stenosis is present.  5. The inferior vena cava is dilated in size with <50% respiratory variability, suggesting right atrial pressure of 15 mmHg.   Coronary CT 3/22 1. Coronary calcium score of 0. This was 0 percentile for age and sex matched control. 2. Normal coronary origin with right dominance. 3. No evidence of CAD. CAD RADS 0. 4. Aortic atherosclerosis. 5. Consider non  atherosclerotic causes of chest pain.   Neuro/Psych  Headaches, Seizures -,  negative psych ROS   GI/Hepatic negative GI ROS, Hepatic steatosis   Endo/Other  diabetes, Poorly Controlled, Type 2, Oral Hypoglycemic Agents  Renal/GU negative Renal ROS     Musculoskeletal negative musculoskeletal ROS (+)   Abdominal   Peds  Hematology negative hematology ROS (+)   Anesthesia Other Findings   Reproductive/Obstetrics                            Anesthesia Physical  Anesthesia Plan  ASA: 4  Anesthesia Plan: General   Post-op Pain Management: Ofirmev IV (intra-op)*   Induction: Intravenous  PONV Risk Score and Plan: 4 or greater and Ondansetron, Midazolam, Dexamethasone and Treatment may vary due to age or medical condition  Airway Management Planned: Oral ETT  Additional Equipment:   Intra-op Plan:   Post-operative Plan: Possible Post-op intubation/ventilation  Informed Consent: I have reviewed the patients History and Physical, chart, labs and discussed the procedure including the risks, benefits and alternatives for the proposed anesthesia with the patient or authorized representative who has indicated his/her understanding and acceptance.     Dental advisory given  Plan Discussed with: CRNA  Anesthesia Plan Comments:        Anesthesia Quick Evaluation

## 2022-03-20 NOTE — Progress Notes (Signed)
Patient left floor to go to pre-op.

## 2022-03-20 NOTE — Anesthesia Procedure Notes (Signed)
Procedure Name: Intubation Date/Time: 03/20/2022 12:07 PM  Performed by: [place] Manus Rudd, CRNAPre-anesthesia Checklist: Patient identified, Emergency Drugs available, Suction available and Patient being monitored Patient Re-evaluated:Patient Re-evaluated prior to induction Oxygen Delivery Method: Circle system utilized Preoxygenation: Pre-oxygenation with 100% oxygen Induction Type: IV induction Ventilation: Mask ventilation without difficulty Laryngoscope Size: 4 and Mac Grade View: Grade I Tube type: Oral Tube size: 7.5 mm Number of attempts: 1 Airway Equipment and Method: Stylet and Oral airway Placement Confirmation: ETT inserted through vocal cords under direct vision, positive ETCO2 and breath sounds checked- equal and bilateral Secured at: 21 cm Tube secured with: Tape Dental Injury: Teeth and Oropharynx as per pre-operative assessment

## 2022-03-20 NOTE — Progress Notes (Signed)
GI ATTENDING  Interval history and data reviewed.  Appreciate surgical evaluation and input.  Agree with plans for cholecystectomy with IOC.  Please reach out if the patient is clearly found to have choledocholithiasis.  Thank you,  Docia Chuck. Geri [place]., M.D. [facility] Division of Gastroenterology

## 2022-03-20 NOTE — Op Note (Signed)
Procedure Note  Pre-operative Diagnosis:  acute cholecystitis, cholelithiasis, possible choledocholithiasis  Post-operative Diagnosis:  same  Surgeon:  Armandina Gemma, MD  Assistant:  none   Procedure:  Laparoscopic cholecystectomy with intra-operative cholangiography  Anesthesia:  General  Estimated Blood Loss:  minimal  Drains: none         Specimen: gallbladder to pathology  Indications: Patient is a 66 year old male with signs and symptoms of acute cholecystitis and cholelithiasis.  There was some question on imaging studies of a possible distal common bile duct abnormality.  Patient now comes to surgery for cholecystectomy with cholangiography.  Procedure description: The patient was seen in the pre-op holding area. The risks, benefits, complications, treatment options, and expected outcomes were previously discussed with the patient. The patient agreed with the proposed plan and has signed the informed consent form.  The patient was transported to operating room #1 at the [facility]. The patient was placed in the supine position on the operating room table. Following induction of general anesthesia, the abdomen was prepped and draped in the usual aseptic fashion.  An incision was made in the skin near the umbilicus. The midline fascia was incised and the peritoneal cavity was entered and a Hasson cannula was introduced under direct vision. The cannula was secured with a 0-Vicryl pursestring suture. Pneumoperitoneum was established with carbon dioxide. Additional cannulae were introduced under direct vision along the right costal margin in the midline, mid-clavicular line, and anterior axillary line.   The gallbladder was identified and the fundus grasped and retracted cephalad. Adhesions were taken down bluntly and the electrocautery was utilized as needed, taking care not to involve any adjacent structures. The infundibulum was grasped and retracted laterally, exposing the  peritoneum overlying the triangle of Calot. The peritoneum was incised and structures exposed with blunt dissection. The cystic duct was clearly identified, bluntly dissected circumferentially, and clipped at the neck of the gallbladder.  An incision was made in the cystic duct and the cholangiogram catheter introduced. The catheter was secured using an ligaclip.  Real-time cholangiography was performed using C-arm fluoroscopy.  There was rapid filling of a mildly dilated caliber common bile duct.  There was reflux of contrast into the left and right hepatic ductal systems.  In the distal common bile duct there initially appeared to be a filling defect, but with instillation of contrast the defect moved distally and through the ampulla into the duodenum leaving the common bile duct appearing normal without filling defect or obstruction.  The catheter was removed from the peritoneal cavity.  The cystic duct was then ligated with ligaclips and divided. The cystic artery was identified, dissected circumferentially, ligated with ligaclips, and divided.  The gallbladder was dissected away from the gallbladder bed using the electrocautery for hemostasis. The gallbladder was completely removed from the liver and placed into an endocatch bag. The gallbladder was removed in the endocatch bag through the umbilical port site and submitted to pathology for review.  The right upper quadrant was irrigated and the gallbladder bed was inspected. A sheet of snow hemostatic agent was placed in the gallbladder bed.  Hemostasis was achieved with the electrocautery.  Cannulae were removed under direct vision and good hemostasis was noted. Pneumoperitoneum was released and the majority of the carbon dioxide evacuated. The umbilical wound was irrigated and the fascia was then closed with the pursestring suture.  Local anesthetic was infiltrated at all port sites. Skin incisions were closed with 4-0 Monocril subcuticular sutures  and Dermabond was  applied.  Instrument, sponge, and needle counts were correct at the conclusion of the case.  The patient was awakened from anesthesia and brought to the recovery room in stable condition.  The patient tolerated the procedure well.   Armandina Gemma, [facility] Office: [phone]

## 2022-03-21 ENCOUNTER — Encounter (HOSPITAL_COMMUNITY): Payer: Self-pay | Admitting: Surgery

## 2022-03-21 DIAGNOSIS — K831 Obstruction of bile duct: Secondary | ICD-10-CM | POA: Diagnosis not present

## 2022-03-21 LAB — GLUCOSE, CAPILLARY
Glucose-Capillary: 131 mg/dL — ABNORMAL HIGH (ref 70–99)
Glucose-Capillary: 119 mg/dL — ABNORMAL HIGH (ref 70–99)
Glucose-Capillary: 131 mg/dL — ABNORMAL HIGH (ref 70–99)
Glucose-Capillary: 140 mg/dL — ABNORMAL HIGH (ref 70–99)

## 2022-03-21 LAB — COMPREHENSIVE METABOLIC PANEL
ALT: 130 U/L — ABNORMAL HIGH (ref 0–44)
AST: 60 U/L — ABNORMAL HIGH (ref 15–41)
Albumin: 3.5 g/dL (ref 3.5–5.0)
Alkaline Phosphatase: 273 U/L — ABNORMAL HIGH (ref 38–126)
Anion gap: 10 (ref 5–15)
BUN: 11 mg/dL (ref 8–23)
CO2: 23 mmol/L (ref 22–32)
Calcium: 8.7 mg/dL — ABNORMAL LOW (ref 8.9–10.3)
Chloride: 99 mmol/L (ref 98–111)
Creatinine, Ser: 0.78 mg/dL (ref 0.61–1.24)
GFR, Estimated: >60 mL/min (ref >60)
Glucose, Bld: 124 mg/dL — ABNORMAL HIGH (ref 70–99)
Potassium: 4.2 mmol/L (ref 3.5–5.1)
Sodium: 132 mmol/L — ABNORMAL LOW (ref 135–145)
Total Bilirubin: 1.9 mg/dL — ABNORMAL HIGH (ref 0.3–1.2)
Total Protein: 7 g/dL (ref 6.5–8.1)

## 2022-03-21 MED ORDER — BISACODYL 10 MG RE SUPP
10.0000 mg | Freq: Once | RECTAL | Status: AC
  Administered 2022-03-21: 10 mg via RECTAL
  Filled 2022-03-21: qty 1

## 2022-03-21 MED ORDER — OXYCODONE HCL 5 MG PO TABS: 5.0000 mg | ORAL_TABLET | ORAL | Status: DC | PRN

## 2022-03-21 MED ORDER — ACETAMINOPHEN 500 MG PO TABS
1000.0000 mg | ORAL_TABLET | Freq: Four times a day (QID) | ORAL | Status: DC
  Administered 2022-03-21 – 2022-03-22 (×5): 1000 mg via ORAL
  Filled 2022-03-21 (×5): qty 2

## 2022-03-21 MED ORDER — LIDOCAINE 5 % EX PTCH
1.0000 | MEDICATED_PATCH | CUTANEOUS | Status: DC
  Administered 2022-03-21 – 2022-03-22 (×2): 1 via TRANSDERMAL
  Filled 2022-03-21 (×2): qty 1

## 2022-03-21 MED ORDER — SENNOSIDES-DOCUSATE SODIUM 8.6-50 MG PO TABS
1.0000 | ORAL_TABLET | Freq: Two times a day (BID) | ORAL | Status: DC
  Administered 2022-03-21 – 2022-03-22 (×3): 1 via ORAL
  Filled 2022-03-21 (×3): qty 1

## 2022-03-21 MED ORDER — POLYETHYLENE GLYCOL 3350 17 G PO PACK
17.0000 g | PACK | Freq: Two times a day (BID) | ORAL | Status: DC
  Administered 2022-03-21 – 2022-03-22 (×3): 17 g via ORAL
  Filled 2022-03-21 (×2): qty 1

## 2022-03-21 MED ORDER — IBUPROFEN 200 MG PO TABS
600.0000 mg | ORAL_TABLET | Freq: Three times a day (TID) | ORAL | Status: DC
  Administered 2022-03-21 – 2022-03-22 (×4): 600 mg via ORAL
  Filled 2022-03-21 (×4): qty 3

## 2022-03-21 MED ORDER — SIMETHICONE 40 MG/0.6ML PO SUSP
40.0000 mg | Freq: Four times a day (QID) | ORAL | Status: DC | PRN
  Administered 2022-03-21: 40 mg via ORAL
  Filled 2022-03-21 (×2): qty 0.6

## 2022-03-21 NOTE — Progress Notes (Signed)
[facility] Progress Note  1 Day Post-Op  Subjective: CC:  Reports significant abdominal pain, different than pre-operative pain. Worse with coughing, moving. Temporarily relieved by IV pain meds. He has been tolerating CLD. Denies BM in 5 days. Some flatus. Reports some nausea but no vomiting.   Objective: Vital signs in last 24 hours: Temp:  [97.1 F (36.2 C)-99.1 F (37.3 C)] 98.5 F (36.9 C) (09/18 0525) Pulse Rate:  [81-98] 93 (09/18 0525) Resp:  [13-20] 20 (09/17 1959) BP: (110-130)/(71-94) 128/84 (09/18 0525) SpO2:  [89 %-100 %] 95 % (09/18 0743) Last BM Date : 03/17/22  Intake/Output from previous day: 09/17 0701 - 09/18 0700 In: 3592.6 [P.O.:640; I.V.:2452.6; IV Piggyback:500] Out: 775 [Urine:750; Blood:25] Intake/Output this shift: Total I/O In: 360 [P.O.:360] Out: 250 [Urine:250]  PE: Gen:  Alert, NAD, cooperative  Card:  Regular rate and rhythm Pulm:  Normal effort on supplemental O2 Abd: Soft, mild distention, appropriately tender, incisions c/d/I  Skin: warm and dry, no rashes  Psych: A&Ox3   Lab Results:  Recent Labs    03/18/22 2206 03/20/22 0542  WBC 13.3* 6.9  HGB 15.7 15.0  HCT 47.0 45.2  PLT 279 283   BMET Recent Labs    03/20/22 0542 03/21/22 0458  NA 136 132*  K 4.1 4.2  CL 103 99  CO2 25 23  GLUCOSE 98 124*  BUN 11 11  CREATININE 0.70 0.78  CALCIUM 8.9 8.7*   PT/INR No results for input(s): "LABPROT", "INR" in the last 72 hours. CMP     Component Value Date/Time   NA 132 (L) 03/21/2022 0458   NA 138 06/22/2020 1138   K 4.2 03/21/2022 0458   CL 99 03/21/2022 0458   CO2 23 03/21/2022 0458   GLUCOSE 124 (H) 03/21/2022 0458   BUN 11 03/21/2022 0458   BUN 12 06/22/2020 1138   CREATININE 0.78 03/21/2022 0458   CALCIUM 8.7 (L) 03/21/2022 0458   PROT 7.0 03/21/2022 0458   PROT 6.9 06/22/2020 1138   ALBUMIN 3.5 03/21/2022 0458   ALBUMIN 4.5 06/22/2020 1138   AST 60 (H) 03/21/2022 0458   ALT 130 (H) 03/21/2022  0458   ALKPHOS 273 (H) 03/21/2022 0458   BILITOT 1.9 (H) 03/21/2022 0458   BILITOT 0.9 06/22/2020 1138   GFRNONAA >60 03/21/2022 0458   GFRAA 101 06/22/2020 1138   Lipase     Component Value Date/Time   LIPASE 29 03/19/2022 0239       Studies/Results: DG Cholangiogram Operative  Result Date: 03/20/2022 CLINICAL DATA:  Cholecystectomy. EXAM: INTRAOPERATIVE CHOLANGIOGRAM TECHNIQUE: Cholangiographic images from the C-arm fluoroscopic device were submitted for interpretation post-operatively. Please see the procedural report for the amount of contrast and the fluoroscopy time utilized. FLUOROSCOPY: Radiation Exposure Index (as provided by the fluoroscopic device): 2.8 mGy Kerma COMPARISON:  MRI and MRCP on 03/19/2022 FINDINGS: Intraoperative imaging with a C-arm demonstrates normal opacified cystic duct stump, common bile duct and visualized intrahepatic ducts. No evidence of biliary dilatation, stricture, filling defect or contrast extravasation. Contrast enters the duodenum. 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 IMPRESSION: Unremarkable intraoperative cholangiogram without evidence choledocholithiasis. 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 Electronically Signed   By: Aletta Edouard M.D.   On: 03/20/2022 14:18   MR ABDOMEN MRCP W WO CONTAST  Result Date: 03/19/2022 CLINICAL DATA:  Acute hepatitis. Elevated LFTs with possible choledocholithiasis. EXAM: MRI ABDOMEN WITHOUT AND WITH CONTRAST (INCLUDING MRCP) TECHNIQUE: Multiplanar multisequence MR imaging of  the abdomen was performed both before and after the administration of intravenous contrast. Heavily T2-weighted images of the biliary and pancreatic ducts were obtained, and three-dimensional MRCP images were rendered by post processing. CONTRAST:  7.6m GADAVIST GADOBUTROL 1 MMOL/ML IV SOLN COMPARISON:  CT from 03/19/2022 FINDINGS: Exam detail is diminished due to considerable motion artifact. Lower chest:  No acute abnormality. Hepatobiliary: Hepatic steatosis is identified. There is scratch set assessment for underlying enhancing liver lesions is significantly diminished by motion artifact. No large enhancing liver masses identified. Small stones are noted layering within the dependent portion of the gallbladder which measure up to 8 mm, image 15/8. No significant gallbladder wall thickening, pericholecystic inflammation or free fluid. Mild intrahepatic bile duct dilatation, image 14/5. The common bile duct measures 8 mm in maximum diameter. There is no convincing evidence for choledocholithiasis. There is a duodenal diverticulum which wraps around the distal common bile duct, image 24/9 and image 14/8. On the coronal T2 weighted images there is a structure which exerts mass effect upon the distal common bile duct measuring approximately 11 mm, image 14/8. Although limited by motion artifact, this appears to represent portions of the duodenal diverticula. Pancreas:  No main duct dilatation, inflammation or mass. Spleen:  Within normal limits in size and appearance. Adrenals/Urinary Tract: Normal adrenal glands. No kidney mass or hydronephrosis identified. Stomach/Bowel: Stomach appears nondistended. 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 No dilated loops of large or small bowel. No signs of bowel wall thickening or inflammation. Vascular/Lymphatic: Aortic atherosclerotic calcifications. No aneurysm. The upper abdominal vascularity appears patent. No abdominal adenopathy. Other:  No significant free fluid or fluid collections. Musculoskeletal: No suspicious bone lesions identified. IMPRESSION: 1. Exam detail is diminished due to motion artifact. 2. Gallstones. No evidence for choledocholithiasis. 3. There is a structure within the region of the head of pancreas at the level of the duodenal diverticulum which has mass effect upon the distal common bile duct. The duodenal diverticulum  extends around the distal common bile duct at this level. According to the literature there have been reports of extra and intrahepatic bile duct dilatation secondary to compression of the intrapancreatic portions of the common bile duct by a periampullary diverticula. As this study is limited by excessive motion artifact, when the patient is clinically stable and able to remain motionless follow-up imaging is advised to exclude underlying mass lesion in this area. 4. Hepatic steatosis. Electronically Signed   By: TKerby MoorsM.D.   On: 03/19/2022 12:43   MR 3D Recon At Scanner  Result Date: 03/19/2022 CLINICAL DATA:  Acute hepatitis. Elevated LFTs with possible choledocholithiasis. EXAM: MRI ABDOMEN WITHOUT AND WITH CONTRAST (INCLUDING MRCP) TECHNIQUE: Multiplanar multisequence MR imaging of the abdomen was performed both before and after the administration of intravenous contrast. Heavily T2-weighted images of the biliary and pancreatic ducts were obtained, and three-dimensional MRCP images were rendered by post processing. CONTRAST:  7.511mGADAVIST GADOBUTROL 1 MMOL/ML IV SOLN COMPARISON:  CT from 03/19/2022 FINDINGS: Exam detail is diminished due to considerable motion artifact. Lower chest: No acute abnormality. Hepatobiliary: Hepatic steatosis is identified. There is scratch set assessment for underlying enhancing liver lesions is significantly diminished by motion artifact. No large enhancing liver masses identified. Small stones are noted layering within the dependent portion of the gallbladder which measure up to 8 mm, image 15/8. No significant gallbladder wall thickening, pericholecystic inflammation or free fluid. Mild intrahepatic bile duct dilatation, image 14/5. The common bile duct measures 8  mm in maximum diameter. There is no convincing evidence for choledocholithiasis. There is a duodenal diverticulum which wraps around the distal common bile duct, image 24/9 and image 14/8. On the coronal  T2 weighted images there is a structure which exerts mass effect upon the distal common bile duct measuring approximately 11 mm, image 14/8. Although limited by motion artifact, this appears to represent portions of the duodenal diverticula. Pancreas:  No main duct dilatation, inflammation or mass. Spleen:  Within normal limits in size and appearance. Adrenals/Urinary Tract: Normal adrenal glands. No kidney mass or hydronephrosis identified. Stomach/Bowel: Stomach appears nondistended. 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 No dilated loops of large or small bowel. No signs of bowel wall thickening or inflammation. Vascular/Lymphatic: Aortic atherosclerotic calcifications. No aneurysm. The upper abdominal vascularity appears patent. No abdominal adenopathy. Other:  No significant free fluid or fluid collections. Musculoskeletal: No suspicious bone lesions identified. IMPRESSION: 1. Exam detail is diminished due to motion artifact. 2. Gallstones. No evidence for choledocholithiasis. 3. There is a structure within the region of the head of pancreas at the level of the duodenal diverticulum which has mass effect upon the distal common bile duct. The duodenal diverticulum extends around the distal common bile duct at this level. According to the literature there have been reports of extra and intrahepatic bile duct dilatation secondary to compression of the intrapancreatic portions of the common bile duct by a periampullary diverticula. As this study is limited by excessive motion artifact, when the patient is clinically stable and able to remain motionless follow-up imaging is advised to exclude underlying mass lesion in this area. 4. Hepatic steatosis. Electronically Signed   By: Kerby Moors M.D.   On: 03/19/2022 12:43    Anti-infectives: Anti-infectives (From admission, onward)    Start     Dose/Rate Route Frequency Ordered Stop   03/19/22 1000  ciprofloxacin (CIPRO) IVPB 400 mg         400 mg 200 mL/hr over 60 Minutes Intravenous Every 12 hours 03/19/22 2841          Assessment/Plan  Acute calculous cholecystitis  S/p laparoscopic cholecystectomy with IOC 03/20/22 Dr. Harlow Asa - afebrile, LFTs stable/slightly improved, IOC negative for choledocholithiasis - having a lot of pain, worse with movement, consistent with post-op pain. I adjusted his PO meds.  - advance diet to soft as tolerated and start bowel regimen  - if his pain improves and he is tolerating PO he could potentially go home as early as this afternoon from a [facility] perspective   FEN: SOFT, IVF per primary ID: cipro, ok to stop abx  VTE: SCDs, ok for chemical VTE  Foley: none, spontaneous voids Dispo: med-surg  COPD Tobacco abyse HLD T2DM    LOS: 2 days   I reviewed nursing notes, hospitalist notes, last 24 h vitals and pain scores, last 48 h intake and output, last 24 h labs and trends, and last 24 h imaging results.    Obie Dredge, PA-C [facility] Please see Amion for pager number during day hours 7:00am-4:30pm

## 2022-03-21 NOTE — Plan of Care (Signed)

## 2022-03-21 NOTE — Progress Notes (Signed)
PROGRESS NOTE Duane Cuevas  LTJ:[id number] DOB: 14-Nov-1955 DOA: 03/18/2022 PCP: [name], MD   Brief Narrative/Hospital Course: a 66 y.o. male with medical history significant of HTN, HLD, tobacco abuse, COPD, DM2 presented with epigastric pain x onset 3 days PTA and  symptoms worsened 9/15. In the ED, blood work-up showed abnormal LFTs, mild leukocytosis lipase 29 chest x-ray COPD/emphysema, CT abdomen pelvis cholelithiasis with suspected mild choledocholithiasis CBD 8 mm.  Patient admitted GI consulted, MRCP ordered-that was motion artifact, gallstone, no choledocholithiasis, structure noted in the region of head of pancreas at the level of the duodenum diverticulum with mass effect upon the distal common bile duct, hepatic steatosis.  GI reviewed MRCP felt that this is more of a symptomatic cholelithiasis > seen by surgery underwent laparoscopic cholecystectomy with [facility] 9/17 Dr. Harlow Asa.    Subjective: Seen and examined. Complains of still ongoing abdominal pain but somewhat better Did not sleep well last night due to pain.  Has some nausea but no vomiting.  Assessment and Plan: Principal Problem:   Biliary obstruction Active Problems:   DM2 (diabetes mellitus, type 2) (HCC)   Cigarette smoker   HTN (hypertension)   COPD (chronic obstructive pulmonary disease) (HCC)   HLD (hyperlipidemia)   Choledocholithiasis with obstruction   Fall   Cholecystitis, acute   Symptomatic cholelithiasis Abnormal LFTs: CT abdomen pelvis cholelithiasis with suspected mild choledocholithiasis CBD 8 mm>MRCP shows gallstone, no choledocholithiasis,structure noted in the region of head of pancreas at the level of the duodenum diverticulum with mass effect upon the distal common bile duct, hepatic steatosis.  GI reviewed MRCP felt that this is more of a symptomatic cholelithiasis advised surgical consultation >  underwent laparoscopic cholecystectomy with [facility] 9/17 Dr. Harlow Asa.  Overnight afebrile  but having a lot of pain with nausea> likely postop. Continue plan as per surgery with bowel regimen pain management-Tylenol/ibuprofen/opiates, diet as tolerated.  Labs with improving AST ALT T. bili at 1.9  COPD Tobacco abuse: Stable not in exacerbation continue Brovana.  Essential hypertension HLD: BP well controlled.continue amlodipine.  Holding statin  T2DM: Last A1c 6.6 on 6/8, blood sugar stable,continue sliding scale.  Hold off metformin and glipizide. Recent Labs  Lab 03/20/22 0542 03/20/22 0804 03/20/22 1131 03/20/22 1635 03/20/22 2203 03/21/22 0751  GLUCAP  --  107* 108* 202* 156* 131*  HGBA1C 6.8*  --   --   --   --   --     DVT prophylaxis: SCD's Start: 03/20/22 1434 SCDs Start: 03/19/22 1614 Code Status:   Code Status: Full Code Family Communication: plan of care discussed with patient at bedside. Patient status is: Inpatient because of symptomatic cholelithiasis Level of care: Med-Surg   Dispo: The patient is from: Home            Anticipated disposition: Home once pain improves and cleared by surgery likely next 24 hours   Objective: Vitals last 24 hrs: Vitals:   03/20/22 1951 03/20/22 1959 03/21/22 0525 03/21/22 0743  BP:  117/78 128/84   Pulse:  87 93   Resp:  20    Temp:  98.2 F (36.8 C) 98.5 F (36.9 C)   TempSrc:  Oral Oral   SpO2: (!) 89% 96% 94% 95%  Weight:      Height:       Weight change:   Physical Examination: General exam: AAox3, older than stated age, weak appearing. HEENT:Oral mucosa moist, Ear/Nose WNL grossly, dentition normal. Respiratory system: bilaterally diminished, no use of accessory muscle  Cardiovascular system: S1 & S2 +, No JVD,. Gastrointestinal system: Abdomen soft, mild generalized tenderness, abdomen is full, BS-sluggish  Nervous System:Alert, awake, moving extremities and grossly nonfocal Extremities: LE ankle edema neg, distal peripheral pulses palpable.  Skin: No rashes,no icterus. MSK: Normal muscle  bulk,tone, power   Medications reviewed:  Scheduled Meds:  acetaminophen  1,000 mg Oral QID   amLODipine  5 mg Oral Daily   arformoterol  15 mcg Nebulization BID   And   umeclidinium bromide  1 puff Inhalation Daily   ibuprofen  600 mg Oral TID   insulin aspart  0-9 Units Subcutaneous TID WC   lidocaine  1 patch Transdermal Q24H   pantoprazole  40 mg Oral Daily   polyethylene glycol  17 g Oral BID   senna-docusate  1 tablet Oral BID   Continuous Infusions:  sodium chloride 50 mL/hr at 03/20/22 1554   lactated ringers Stopped (03/19/22 1355)      Diet Order             DIET SOFT Room service appropriate? Yes; Fluid consistency: Thin  Diet effective now                 Follow-up Unresulted Labs (From admission, onward)    None     Data Reviewed: I have personally reviewed following labs and imaging studies CBC: Recent Labs  Lab 03/18/22 2206 03/20/22 0542  WBC 13.3* 6.9  HGB 15.7 15.0  HCT 47.0 45.2  MCV 90.7 90.9  PLT 279 540   Basic Metabolic Panel: Recent Labs  Lab 03/18/22 2206 03/20/22 0542 03/21/22 0458  NA 135 136 132*  K 4.0 4.1 4.2  CL 103 103 99  CO2 '23 25 23  '$ GLUCOSE 108* 98 124*  BUN '13 11 11  '$ CREATININE 0.74 0.70 0.78  CALCIUM 9.1 8.9 8.7*   GFR: Estimated Creatinine Clearance: 87.9 mL/min (by C-G formula based on SCr of 0.78 mg/dL). Liver Function Tests: Recent Labs  Lab 03/19/22 0239 03/20/22 0542 03/21/22 0458  AST 144* 122* 60*  ALT 120* 170* 130*  ALKPHOS 291* 313* 273*  BILITOT 3.0* 1.8* 1.9*  PROT 7.8 6.9 7.0  ALBUMIN 4.0 3.2* 3.5   Recent Labs  Lab 03/19/22 0239  LIPASE 29   No results for input(s): "AMMONIA" in the last 168 hours. Coagulation Profile: No results for input(s): "INR", "PROTIME" in the last 168 hours. BNP (last 3 results) No results for input(s): "PROBNP" in the last 8760 hours. HbA1C: Recent Labs    03/20/22 0542  HGBA1C 6.8*   CBG: Recent Labs  Lab 03/20/22 0804 03/20/22 1131  03/20/22 1635 03/20/22 2203 03/21/22 0751  GLUCAP 107* 108* 202* 156* 131*   Lipid Profile: No results for input(s): "CHOL", "HDL", "LDLCALC", "TRIG", "CHOLHDL", "LDLDIRECT" in the last 72 hours. Thyroid Function Tests: No results for input(s): "TSH", "T4TOTAL", "FREET4", "T3FREE", "THYROIDAB" in the last 72 hours. Sepsis Labs: No results for input(s): "PROCALCITON", "LATICACIDVEN" in the last 168 hours.  No results found for this or any previous visit (from the past 240 hour(s)).  Antimicrobials: Anti-infectives (From admission, onward)    Start     Dose/Rate Route Frequency Ordered Stop   03/19/22 1000  ciprofloxacin (CIPRO) IVPB 400 mg  Status:  Discontinued        400 mg 200 mL/hr over 60 Minutes Intravenous Every 12 hours 03/19/22 0921 03/21/22 1027      Culture/Microbiology    Component Value Date/Time   SDES SPUTUM 10/21/2020 0604  SDES  10/21/2020 0604    SPUTUM Performed at [facility], [place] [address], [place], [place] [address]    [place] 10/21/2020 0604   SPECREQUEST  10/21/2020 0604    NONE Reflexed from [id number] Performed at [facility], [place] [address] [place], [place] [address]    CULT  10/21/2020 0272    FEW Consistent with normal respiratory flora. No Pseudomonas species isolated Performed at [place] [address], [place],  [address]    REPTSTATUS 10/21/2020 FINAL 10/21/2020 0604   REPTSTATUS 10/23/2020 FINAL 10/21/2020 0604    Other culture-see note  Radiology Studies: DG Cholangiogram Operative  Result Date: 03/20/2022 CLINICAL DATA:  Cholecystectomy. EXAM: INTRAOPERATIVE CHOLANGIOGRAM TECHNIQUE: Cholangiographic images from the C-arm fluoroscopic device were submitted for interpretation post-operatively. Please see the procedural report for the amount of contrast and the fluoroscopy time utilized. FLUOROSCOPY: Radiation Exposure Index (as provided by the fluoroscopic device): 2.8  mGy Kerma COMPARISON:  MRI and MRCP on 03/19/2022 FINDINGS: Intraoperative imaging with a C-arm demonstrates normal opacified cystic duct stump, common bile duct and visualized intrahepatic ducts. No evidence of biliary dilatation, stricture, filling defect or contrast extravasation. Contrast enters the duodenum. 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 IMPRESSION: Unremarkable intraoperative cholangiogram without evidence choledocholithiasis. 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 Electronically Signed   By: Aletta Edouard M.D.   On: 03/20/2022 14:18   MR ABDOMEN MRCP W WO CONTAST  Result Date: 03/19/2022 CLINICAL DATA:  Acute hepatitis. Elevated LFTs with possible choledocholithiasis. EXAM: MRI ABDOMEN WITHOUT AND WITH CONTRAST (INCLUDING MRCP) TECHNIQUE: Multiplanar multisequence MR imaging of the abdomen was performed both before and after the administration of intravenous contrast. Heavily T2-weighted images of the biliary and pancreatic ducts were obtained, and three-dimensional MRCP images were rendered by post processing. CONTRAST:  7.40m GADAVIST GADOBUTROL 1 MMOL/ML IV SOLN COMPARISON:  CT from 03/19/2022 FINDINGS: Exam detail is diminished due to considerable motion artifact. Lower chest: No acute abnormality. Hepatobiliary: Hepatic steatosis is identified. There is scratch set assessment for underlying enhancing liver lesions is significantly diminished by motion artifact. No large enhancing liver masses identified. Small stones are noted layering within the dependent portion of the gallbladder which measure up to 8 mm, image 15/8. No significant gallbladder wall thickening, pericholecystic inflammation or free fluid. Mild intrahepatic bile duct dilatation, image 14/5. The common bile duct measures 8 mm in maximum diameter. There is no convincing evidence for choledocholithiasis. There is a duodenal diverticulum which wraps around the distal common bile duct, image  24/9 and image 14/8. On the coronal T2 weighted images there is a structure which exerts mass effect upon the distal common bile duct measuring approximately 11 mm, image 14/8. Although limited by motion artifact, this appears to represent portions of the duodenal diverticula. Pancreas:  No main duct dilatation, inflammation or mass. Spleen:  Within normal limits in size and appearance. Adrenals/Urinary Tract: Normal adrenal glands. No kidney mass or hydronephrosis identified. Stomach/Bowel: Stomach appears nondistended. 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 No dilated loops of large or small bowel. No signs of bowel wall thickening or inflammation. Vascular/Lymphatic: Aortic atherosclerotic calcifications. No aneurysm. The upper abdominal vascularity appears patent. No abdominal adenopathy. Other:  No significant free fluid or fluid collections. Musculoskeletal: No suspicious bone lesions identified. IMPRESSION: 1. Exam detail is diminished due to motion artifact. 2. Gallstones. No evidence for choledocholithiasis. 3. There is a structure within the region  of the head of pancreas at the level of the duodenal diverticulum which has mass effect upon the distal common bile duct. The duodenal diverticulum extends around the distal common bile duct at this level. According to the literature there have been reports of extra and intrahepatic bile duct dilatation secondary to compression of the intrapancreatic portions of the common bile duct by a periampullary diverticula. As this study is limited by excessive motion artifact, when the patient is clinically stable and able to remain motionless follow-up imaging is advised to exclude underlying mass lesion in this area. 4. Hepatic steatosis. Electronically Signed   By: Kerby Moors M.D.   On: 03/19/2022 12:43   MR 3D Recon At Scanner  Result Date: 03/19/2022 CLINICAL DATA:  Acute hepatitis. Elevated LFTs with possible  choledocholithiasis. EXAM: MRI ABDOMEN WITHOUT AND WITH CONTRAST (INCLUDING MRCP) TECHNIQUE: Multiplanar multisequence MR imaging of the abdomen was performed both before and after the administration of intravenous contrast. Heavily T2-weighted images of the biliary and pancreatic ducts were obtained, and three-dimensional MRCP images were rendered by post processing. CONTRAST:  7.90m GADAVIST GADOBUTROL 1 MMOL/ML IV SOLN COMPARISON:  CT from 03/19/2022 FINDINGS: Exam detail is diminished due to considerable motion artifact. Lower chest: No acute abnormality. Hepatobiliary: Hepatic steatosis is identified. There is scratch set assessment for underlying enhancing liver lesions is significantly diminished by motion artifact. No large enhancing liver masses identified. Small stones are noted layering within the dependent portion of the gallbladder which measure up to 8 mm, image 15/8. No significant gallbladder wall thickening, pericholecystic inflammation or free fluid. Mild intrahepatic bile duct dilatation, image 14/5. The common bile duct measures 8 mm in maximum diameter. There is no convincing evidence for choledocholithiasis. There is a duodenal diverticulum which wraps around the distal common bile duct, image 24/9 and image 14/8. On the coronal T2 weighted images there is a structure which exerts mass effect upon the distal common bile duct measuring approximately 11 mm, image 14/8. Although limited by motion artifact, this appears to represent portions of the duodenal diverticula. Pancreas:  No main duct dilatation, inflammation or mass. Spleen:  Within normal limits in size and appearance. Adrenals/Urinary Tract: Normal adrenal glands. No kidney mass or hydronephrosis identified. Stomach/Bowel: Stomach appears nondistended. 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 No dilated loops of large or small bowel. No signs of bowel wall thickening or inflammation. Vascular/Lymphatic: Aortic  atherosclerotic calcifications. No aneurysm. The upper abdominal vascularity appears patent. No abdominal adenopathy. Other:  No significant free fluid or fluid collections. Musculoskeletal: No suspicious bone lesions identified. IMPRESSION: 1. Exam detail is diminished due to motion artifact. 2. Gallstones. No evidence for choledocholithiasis. 3. There is a structure within the region of the head of pancreas at the level of the duodenal diverticulum which has mass effect upon the distal common bile duct. The duodenal diverticulum extends around the distal common bile duct at this level. According to the literature there have been reports of extra and intrahepatic bile duct dilatation secondary to compression of the intrapancreatic portions of the common bile duct by a periampullary diverticula. As this study is limited by excessive motion artifact, when the patient is clinically stable and able to remain motionless follow-up imaging is advised to exclude underlying mass lesion in this area. 4. Hepatic steatosis. Electronically Signed   By: TKerby MoorsM.D.   On: 03/19/2022 12:43     LOS: 2 days   RAntonieta Pert MD [facility]  03/21/2022, 10:38  AM

## 2022-03-21 NOTE — Discharge Instructions (Signed)
[facility] LAPAROSCOPIC SURGERY: POST OP INSTRUCTIONS Always review your discharge instruction sheet given to you by the facility where your surgery was performed. IF YOU HAVE DISABILITY OR FAMILY LEAVE FORMS, YOU MUST BRING THEM TO THE OFFICE FOR PROCESSING.   DO NOT GIVE THEM TO YOUR DOCTOR.  PAIN CONTROL  First take acetaminophen (Tylenol) AND/or ibuprofen (Advil) to control your pain after surgery.  Follow directions on package.  Taking acetaminophen (Tylenol) and/or ibuprofen (Advil) regularly after surgery will help to control your pain and lower the amount of prescription pain medication you may need.  You should not take more than 3,000 mg (3 grams) of acetaminophen (Tylenol) in 24 hours.  You should not take ibuprofen (Advil), aleve, motrin, naprosyn or other NSAIDS if you have a history of stomach ulcers or chronic kidney disease.  A prescription for pain medication may be given to you upon discharge.  Take your pain medication as prescribed, if you still have uncontrolled pain after taking acetaminophen (Tylenol) or ibuprofen (Advil). Use ice packs to help control pain. If you need a refill on your pain medication, please contact your pharmacy.  They will contact our office to request authorization. Prescriptions will not be filled after 5pm or on week-ends.  HOME MEDICATIONS Take your usually prescribed medications unless otherwise directed.  DIET You should follow a light diet the first few days after arrival home.  Be sure to include lots of fluids daily. Avoid fatty, fried foods.   CONSTIPATION It is common to experience some constipation after surgery and if you are taking pain medication.  Increasing fluid intake and taking a stool softener (such as Colace) will usually help or prevent this problem from occurring.  A mild laxative (Milk of Magnesia or Miralax) should be taken according to package instructions if there are no bowel movements after 48  hours.  WOUND/INCISION CARE Most patients will experience some swelling and bruising in the area of the incisions.  Ice packs will help.  Swelling and bruising can take several days to resolve.  Unless discharge instructions indicate otherwise, follow guidelines below  STERI-STRIPS - you may remove your outer bandages 48 hours after surgery, and you may shower at that time.  You have steri-strips (small skin tapes) in place directly over the incision.  These strips should be left on the skin for 7-10 days.   DERMABOND/SKIN GLUE - you may shower in 24 hours.  The glue will flake off over the next 2-3 weeks. Any sutures or staples will be removed at the office during your follow-up visit.  ACTIVITIES You may resume regular (light) daily activities beginning the next day--such as daily self-care, walking, climbing stairs--gradually increasing activities as tolerated.  You may have sexual intercourse when it is comfortable.  Refrain from any heavy lifting or straining until approved by your doctor. You may drive when you are no longer taking prescription pain medication, you can comfortably wear a seatbelt, and you can safely maneuver your car and apply brakes.  FOLLOW-UP You should see your doctor in the office for a follow-up appointment approximately 2-3 weeks after your surgery.  You should have been given your post-op/follow-up appointment when your surgery was scheduled.  If you did not receive a post-op/follow-up appointment, make sure that you call for this appointment within a day or two after you arrive home to insure a convenient appointment time.   WHEN TO CALL YOUR DOCTOR: Fever over 101.0 Inability to urinate Continued bleeding from incision.   Increased pain, redness, or drainage from the incision. Increasing abdominal pain  The clinic staff is available to answer your questions during regular business hours.  Please don't hesitate to call and ask to speak to one of the nurses for  clinical concerns.  If you have a medical emergency, go to the nearest emergency room or call 911.  A surgeon from [facility] is always on call at the hospital. [address], [address], [place], [place]  [address] ? [address] [address], [place], [place]   [address] [phone] ? [phone] ? FAX [phone] Web site: [email or web address]   CIRUGIA LAPAROSCOPICA: INSTRUCCIONES DE POST OPERATORIO.  Revise siempre los documentos que le entreguen en el lugar donde se ha hecho la cirugia.  SI USTED NECESITA DOCUMENTOS DE INCAPACIDAD (DISABLE) O DE PERMISO FAMILAR (FAMILY LEAVE) NECESITA TRAERLOS A LA OFICINA PARA QUE SEAN PROCESADOS. NO  SE LOS DE A SU DOCTOR. A su alta del hospital se le dara una receta para controlar el dolor. Tomela como ha sido recetada, si la necesita. Si no la necesita puede tomar, Acetaminofen (Tylenol) o Ibuprofen (Advil) para aliviar dolor moderado. Continue tomando el resto de sus medicinas. Si necesita rellenar la receta, llame a la farmacia. ellos contactan a nuestra oficina pidiendo autorizacion. Este tipo de receta no pueden ser rellenadas despues de las  5pm o durante los fines de semana. Con relacion a la dieta: debe ser ligera los primeros dias despues que llege a la casa. Ejemplo: sopas y galleticas. Tome bastante liquido esos dias. La mayoria de los pacientes padecen de inflamacion y cambio de coloracion de la piel alrededor de las incisiones. esto toma dias en resolver.  pnerse una bolsa de hielo en el area affectada ayuda..  Es comun tambien tener un poco de estrenimiento si esta tomado medicinas para el dolor. incremente la cantidad de liquidos a tomar y puede tomar (Colace) esto previene el problema. Si ya tiene estrenimiento, es decir no ha defecado en 48 horas, puede tomar un laxativo (Milk of Magnesia or Miralax) uselo como el paquete le explica.  A menos que se le diga algo diferente. Remueva el bendaje a las 24-48 horas despues  dela cirugia. y puede banarse en la ducha sin ningun problema. usted puede tener steri-strips (pequenas curitas transparentes en la piel puesta encima de la incision)  Estas banditas strips should be left on the skin for 7-10 days.   Si su cirujano puso pegamento encima de la incision usted puede banarse bajo la ducha en 24 horas. Este pegamento empezara a caerse en las proximas 2-3 semanas. Si le pusieron suturas o presillas (grapos) estos seran quitados en su proxima cita en la oficina. . ACTIVIDADES:  Puede hacer actividad ligera.  Como caminar , subir escaleras y poco a poco irlas incrementando tanto como las tolere. Puede tener relaciones sexuales cuando sea comfortable. No carge objetos pesados o haga esfuerzos que no sean aprovados por su doctor. Puede manejar en cuanto no esta tomando medicamentos fuertes (narcoticos) para el dolor, pueda abrochar confortablemente el cinturon de seguridad, y pueda maniobrar y usar los pedales de su vehiculo con seguridad. PUEDE REGRESAR A TRABAJAR  Debe ver a su doctor para una cita de seguimiento en 2-3 semanas despues de la cirugia.  OTRAS ISNSTRUCCIONES:___________________________________________________________________________________ CUANDO LLAMAR A SU MEDICO: FIEBRE mayor de  101.0 No produccion de orina. Sangramiento continue de la herida Incremento de dolor, enrojecimientio o drenaje de la herida (incision) Incremento de dolor abdominal.  The clinic staff   is available to answer your questions during regular business hours.  Please don't hesitate to call and ask to speak to one of the nurses for clinical concerns.  If you have a medical emergency, go to the nearest emergency room or call 911.  A surgeon from [facility] is always on call at the hospital. [address], [address], [place], [place]  [address] ? [address] [address], [place], [place]   [address] [phone] ? [phone] ? FAX [phone] Web site:  [email or web address]  

## 2022-03-21 NOTE — Progress Notes (Signed)
03/21/2022  1607  Notified Dr Kieth Brightly of pt's c/o sharp rt side abdomen pain that occurs q 15-20 mins.

## 2022-03-21 NOTE — Progress Notes (Signed)
03/21/2022  1554  Notified Dr Debe Coder of patient's c/o rt side abdomen pain that is sharpe and occurs q 15 mins.

## 2022-03-21 NOTE — Progress Notes (Signed)
  Transition of Care [facility]) Screening Note   Patient Details  Name: Duane Cuevas Date of Birth: 1955/08/25   Transition of Care [facility]) CM/SW Contact:    Vassie Moselle, LCSW Phone Number: 03/21/2022, 12:56 PM    Transition of Care Department [facility]) has reviewed patient and no TOC needs have been identified at this time. We will continue to monitor patient advancement through interdisciplinary progression rounds. If new patient transition needs arise, please place a TOC consult.

## 2022-03-22 DIAGNOSIS — K831 Obstruction of bile duct: Secondary | ICD-10-CM | POA: Diagnosis not present

## 2022-03-22 LAB — COMPREHENSIVE METABOLIC PANEL
ALT: 80 U/L — ABNORMAL HIGH (ref 0–44)
AST: 28 U/L (ref 15–41)
Albumin: 3 g/dL — ABNORMAL LOW (ref 3.5–5.0)
Alkaline Phosphatase: 207 U/L — ABNORMAL HIGH (ref 38–126)
Anion gap: 2 — ABNORMAL LOW (ref 5–15)
BUN: 13 mg/dL (ref 8–23)
CO2: 26 mmol/L (ref 22–32)
Calcium: 8.7 mg/dL — ABNORMAL LOW (ref 8.9–10.3)
Chloride: 109 mmol/L (ref 98–111)
Creatinine, Ser: 0.85 mg/dL (ref 0.61–1.24)
GFR, Estimated: >60 mL/min (ref >60)
Glucose, Bld: 120 mg/dL — ABNORMAL HIGH (ref 70–99)
Potassium: 4.2 mmol/L (ref 3.5–5.1)
Sodium: 137 mmol/L (ref 135–145)
Total Bilirubin: 1.1 mg/dL (ref 0.3–1.2)
Total Protein: 6.3 g/dL — ABNORMAL LOW (ref 6.5–8.1)

## 2022-03-22 LAB — CBC WITH DIFFERENTIAL/PLATELET
Abs Immature Granulocytes: 0.04 10*3/uL (ref 0.00–0.07)
Basophils Absolute: 0 10*3/uL (ref 0.0–0.1)
Basophils Relative: 0 %
Eosinophils Absolute: 0.3 10*3/uL (ref 0.0–0.5)
Eosinophils Relative: 4 %
HCT: 42.9 % (ref 39.0–52.0)
Hemoglobin: 13.8 g/dL (ref 13.0–17.0)
Immature Granulocytes: 1 %
Lymphocytes Relative: 10 %
Lymphs Abs: 0.9 10*3/uL (ref 0.7–4.0)
MCH: 29.6 pg (ref 26.0–34.0)
MCHC: 32.2 g/dL (ref 30.0–36.0)
MCV: 92.1 fL (ref 80.0–100.0)
Monocytes Absolute: 1 10*3/uL (ref 0.1–1.0)
Monocytes Relative: 12 %
Neutro Abs: 6.1 10*3/uL (ref 1.7–7.7)
Neutrophils Relative %: 73 %
Platelets: 265 10*3/uL (ref 150–400)
RBC: 4.66 MIL/uL (ref 4.22–5.81)
RDW: 13.4 % (ref 11.5–15.5)
WBC: 8.3 10*3/uL (ref 4.0–10.5)
nRBC: 0 % (ref 0.0–0.2)

## 2022-03-22 LAB — SURGICAL PATHOLOGY

## 2022-03-22 LAB — GLUCOSE, CAPILLARY: Glucose-Capillary: 111 mg/dL — ABNORMAL HIGH (ref 70–99)

## 2022-03-22 MED ORDER — METHOCARBAMOL 500 MG PO TABS
750.0000 mg | ORAL_TABLET | Freq: Three times a day (TID) | ORAL | Status: DC
  Administered 2022-03-22: 750 mg via ORAL
  Filled 2022-03-22: qty 2

## 2022-03-22 MED ORDER — HYDROMORPHONE HCL 1 MG/ML IJ SOLN: 0.5000 mg | INTRAMUSCULAR | Status: DC | PRN

## 2022-03-22 MED ORDER — OXYCODONE HCL 5 MG PO TABS: 5.0000 mg | ORAL_TABLET | Freq: Four times a day (QID) | ORAL | 0 refills | Status: DC | PRN

## 2022-03-22 NOTE — Care Management Important Message (Signed)
Important Message  Patient Details IM Letter given to the Patient. Name: Duane Cuevas MRN: [id number] Date of Birth: 02/23/1956   Medicare Important Message Given:  Yes     Kerin Salen 03/22/2022, 11:02 AM

## 2022-03-22 NOTE — Progress Notes (Signed)
2 Days Post-Op  Subjective: CC: Spanish interpreter used for visit Patient reports pain mainly around epigastric and umbilical incision, worse with movement and well controlled with IV pain medications. Has not yet tried po oxy. Tolerating diet without n/v. Voiding. Mobilizing well.   Objective: Vital signs in last 24 hours: Temp:  [98.1 F (36.7 C)-98.8 F (37.1 C)] 98.1 F (36.7 C) (09/19 0457) Pulse Rate:  [86-90] 88 (09/19 0457) Resp:  [18] 18 (09/18 1950) BP: (102-107)/(67-76) 107/76 (09/19 0457) SpO2:  [90 %-93 %] 90 % (09/19 0731) Last BM Date : 03/21/22  Intake/Output from previous day: 09/18 0701 - 09/19 0700 In: 1393.8 [P.O.:360; I.V.:1033.8] Out: 4400 [Urine:4400] Intake/Output this shift: No intake/output data recorded.  PE: Gen:  Alert, NAD, pleasant Pulm: Normal rate and effort Abd: Soft, ND, appropriately tender over umbilical and epigastric incisions without rigidity or guarding - otherwise NT. +BS, incisions with glue intact appears well and are without drainage, bleeding, or signs of infection Psych: A&Ox3   Lab Results:  Recent Labs    03/20/22 0542 03/22/22 0521  WBC 6.9 8.3  HGB 15.0 13.8  HCT 45.2 42.9  PLT 283 265   BMET Recent Labs    03/21/22 0458 03/22/22 0521  NA 132* 137  K 4.2 4.2  CL 99 109  CO2 23 26  GLUCOSE 124* 120*  BUN 11 13  CREATININE 0.78 0.85  CALCIUM 8.7* 8.7*   PT/INR No results for input(s): "LABPROT", "INR" in the last 72 hours. CMP     Component Value Date/Time   NA 137 03/22/2022 0521   NA 138 06/22/2020 1138   K 4.2 03/22/2022 0521   CL 109 03/22/2022 0521   CO2 26 03/22/2022 0521   GLUCOSE 120 (H) 03/22/2022 0521   BUN 13 03/22/2022 0521   BUN 12 06/22/2020 1138   CREATININE 0.85 03/22/2022 0521   CALCIUM 8.7 (L) 03/22/2022 0521   PROT 6.3 (L) 03/22/2022 0521   PROT 6.9 06/22/2020 1138   ALBUMIN 3.0 (L) 03/22/2022 0521   ALBUMIN 4.5 06/22/2020 1138   AST 28 03/22/2022 0521   ALT 80 (H)  03/22/2022 0521   ALKPHOS 207 (H) 03/22/2022 0521   BILITOT 1.1 03/22/2022 0521   BILITOT 0.9 06/22/2020 1138   GFRNONAA >60 03/22/2022 0521   GFRAA 101 06/22/2020 1138   Lipase     Component Value Date/Time   LIPASE 29 03/19/2022 0239    Studies/Results: DG Cholangiogram Operative  Result Date: 03/20/2022 CLINICAL DATA:  Cholecystectomy. EXAM: INTRAOPERATIVE CHOLANGIOGRAM TECHNIQUE: Cholangiographic images from the C-arm fluoroscopic device were submitted for interpretation post-operatively. Please see the procedural report for the amount of contrast and the fluoroscopy time utilized. FLUOROSCOPY: Radiation Exposure Index (as provided by the fluoroscopic device): 2.8 mGy Kerma COMPARISON:  MRI and MRCP on 03/19/2022 FINDINGS: Intraoperative imaging with a C-arm demonstrates normal opacified cystic duct stump, common bile duct and visualized intrahepatic ducts. No evidence of biliary dilatation, stricture, filling defect or contrast extravasation. Contrast enters the duodenum. 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 IMPRESSION: Unremarkable intraoperative cholangiogram without evidence choledocholithiasis. 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 Electronically Signed   By: Aletta Edouard M.D.   On: 03/20/2022 14:18    Anti-infectives: Anti-infectives (From admission, onward)    Start     Dose/Rate Route Frequency Ordered Stop   03/19/22 1000  ciprofloxacin (CIPRO) IVPB 400 mg  Status:  Discontinued        400 mg  200 mL/hr over 60 Minutes Intravenous Every 12 hours 03/19/22 0921 03/21/22 1027        Assessment/Plan POD 2 s/p S/p laparoscopic cholecystectomy with IOC for Acute calculous cholecystitis on 03/20/22 by Dr. Harlow Asa - AFVSS, LFTs downtrending, IOC negative for choledocholithiasis - Doing well overall. Tolerating diet without n/v, mobilizing, voiding, incision cdi. His pain is well controlled but still using IV pain medication, has not tried PO  oxy. I have scheduled non-narcotics (Tylenol, Ibuprofen and Robaxin). He has prn Oxy for Moderate to Severe pain that is not controlled with scheduled non-narcotics. I have changed parameters for IV pain medication to only be given for breakthrough pain.  - If pain is well controlled with po medications, he is okay for discharge from our standpoint. I will arrange follow up. Discussed discharge instructions, restrictions and return/call back precautions    FEN: SOFT, IVF per primary ID: cipro 9/16 - 9/18 VTE: SCDs, ok for chemical VTE  Foley: none, voiding Dispo: med-surg   COPD Tobacco abyse HLD T2DM      LOS: 3 days    Jillyn Ledger , [facility] 03/22/2022, 9:53 AM Please see Amion for pager number during day hours 7:00am-4:30pm

## 2022-03-22 NOTE — Discharge Summary (Signed)
Physician Discharge Summary  Duane Cuevas FWY:[id number] DOB: 06/06/1956 DOA: 03/18/2022  PCP: [name], MD  Admit date: 03/18/2022 Discharge date: 03/22/2022 Recommendations for Outpatient Follow-up:  Follow up with PCP and general surgery in 1 weeks-call for appointment Please obtain BMP/CBC in one week  Discharge Dispo: Home Discharge Condition: Stable Code Status:   Code Status: Full Code Diet recommendation:  Diet Order             DIET SOFT Room service appropriate? Yes; Fluid consistency: Thin  Diet effective now                    Brief/Interim Summary: a 66 y.o. male with medical history significant of HTN, HLD, tobacco abuse, COPD, DM2 presented with epigastric pain x onset 3 days PTA and  symptoms worsened 9/15. In the ED, blood work-up showed abnormal LFTs, mild leukocytosis lipase 29 chest x-ray COPD/emphysema, CT abdomen pelvis cholelithiasis with suspected mild choledocholithiasis CBD 8 mm.  Patient admitted GI consulted, MRCP ordered-that was motion artifact, gallstone, no choledocholithiasis, structure noted in the region of head of pancreas at the level of the duodenum diverticulum with mass effect upon the distal common bile duct, hepatic steatosis.  GI reviewed MRCP felt that this is more of a symptomatic cholelithiasis > seen by surgery underwent laparoscopic cholecystectomy with [facility] 9/17 Dr. Harlow Asa.   Postop patient had sharp pain felt bloated monitor overnight. Now he is tolerating diet, passing gas,had bowel movement,feels well enough to go home today  Discharge Diagnoses:  Principal Problem:   Biliary obstruction Active Problems:   DM2 (diabetes mellitus, type 2) (HCC)   Cigarette smoker   HTN (hypertension)   COPD (chronic obstructive pulmonary disease) (HCC)   HLD (hyperlipidemia)   Choledocholithiasis with obstruction   Fall   Cholecystitis, acute  Symptomatic cholelithiasis Abnormal LFTs: CT abdomen pelvis cholelithiasis with  suspected mild choledocholithiasis CBD 8 mm>MRCP shows gallstone, no choledocholithiasis,structure noted in the region of head of pancreas at the level of the duodenum diverticulum with mass effect upon the distal common bile duct, hepatic steatosis.  GI reviewed MRCP felt that this is more of a symptomatic cholelithiasis advised surgical consultation >  underwent laparoscopic cholecystectomy with [facility] 9/17 Dr. Harlow Asa.  Overnight afebrile but having a lot of pain with nausea> likely postop. TODAY resting comfortably passing gas had BM yesterday pain significantly improved today tolerating diet.  LFTs have improved.  Discussed with general surgery and okay for discharge.    COPD Tobacco abuse: Stable not in exacerbation continue Brovana.   Essential hypertension HLD: BP well controlled.continue amlodipine.  Holding statin   T2DM: Last A1c 6.6 on 6/8, blood sugar stable,continue sliding scale.  Hold off metformin and glipizide  Consults: General surgery, GI Subjective: Alert awake resting comfortably passing gas had BM yesterday pain significantly improved today tolerating diet.  Would like to go home. Discussed with surgery and okay for discharge  Discharge Exam: Vitals:   03/22/22 0457 03/22/22 0731  BP: 107/76   Pulse: 88   Resp:    Temp: 98.1 F (36.7 C)   SpO2: 93% 90%   General: Pt is alert, awake, not in acute distress Cardiovascular: RRR, S1/S2 +, no rubs, no gallops Respiratory: CTA bilaterally, no wheezing, no rhonchi Abdominal: Soft, NT, ND, bowel sounds + Extremities: no edema, no cyanosis  Discharge Instructions  Discharge Instructions     Discharge instructions   Complete by: As directed    Follow-up with general surgery as scheduled.  Follow-up with PCP in 1 week.  Please call call MD or return to ER for similar or worsening recurring problem that brought you to hospital or if any fever,nausea/vomiting,abdominal pain, uncontrolled pain, chest pain,  shortness of  breath or any other alarming symptoms.  Please follow-up your doctor as instructed in a week time and call the office for appointment.  Please avoid alcohol, smoking, or any other illicit substance and maintain healthy habits including taking your regular medications as prescribed.  You were cared for by a hospitalist during your hospital stay. If you have any questions about your discharge medications or the care you received while you were in the hospital after you are discharged, you can call the unit and ask to speak with the hospitalist on call if the hospitalist that took care of you is not available.  Once you are discharged, your primary care physician will handle any further medical issues. Please note that NO REFILLS for any discharge medications will be authorized once you are discharged, as it is imperative that you return to your primary care physician (or establish a relationship with a primary care physician if you do not have one) for your aftercare needs so that they can reassess your need for medications and monitor your lab values   Increase activity slowly   Complete by: As directed    No wound care   Complete by: As directed       Allergies as of 03/22/2022   No Known Allergies      Medication List     STOP taking these medications    doxycycline 100 MG tablet Commonly known as: VIBRA-TABS       TAKE these medications    Accu-Chek Guide test strip Generic drug: glucose blood Use as instructed   albuterol 108 (90 Base) MCG/ACT inhaler Commonly known as: VENTOLIN HFA Inhale 2 puffs into the lungs every 6 (six) hours as needed for wheezing or shortness of breath. What changed: Another medication with the same name was changed. Make sure you understand how and when to take each.   albuterol (2.5 MG/3ML) 0.083% nebulizer solution Commonly known as: PROVENTIL USE 1 VIAL VIA NEBULIZER EVERY 6 HOURS AS NEEDED FOR WHEEZING OR SHORTNESS OF BREATH What changed:   how much to take how to take this when to take this reasons to take this   amLODipine 5 MG tablet Commonly known as: NORVASC TOMAR 1 TABLETA POR VAI ORAL UNA VEZ AL DIA What changed: See the new instructions.   atorvastatin 20 MG tablet Commonly known as: LIPITOR TAKE ONE TABLET BY MOUTH ONE TIME DAILY   B-D ULTRAFINE III SHORT PEN 31G X 8 MM Misc Generic drug: Insulin Pen Needle USAR PARA INYECTAR INSULINA   B-D ULTRAFINE III SHORT PEN 31G X 8 MM Misc Generic drug: Insulin Pen Needle USAR PARA INYECTAR INSULINA   benzonatate 100 MG capsule Commonly known as: Tessalon Perles Take 1 capsule (100 mg total) by mouth 3 (three) times daily as needed for cough.   blood glucose meter kit and supplies Dispense based on patient and insurance preference. Use up to four times daily as directed. (FOR ICD-10 E10.9, E11.9).   blood glucose meter kit and supplies Kit Dispense based on patient and insurance preference. Use up to four times daily as directed.   Breztri Aerosphere 160-9-4.8 MCG/ACT Aero Generic drug: Budeson-Glycopyrrol-Formoterol Inhale 2 puffs into the lungs 2 (two) times daily. What changed:  when to take this reasons to take  this   cyclobenzaprine 10 MG tablet Commonly known as: FLEXERIL Take 1 tablet (10 mg total) by mouth 2 (two) times daily as needed for muscle spasms.   Farxiga 10 MG Tabs tablet Generic drug: dapagliflozin propanediol Take 10 mg by mouth daily.   glipiZIDE 10 MG tablet Commonly known as: Glucotrol Take 1 tablet (10 mg total) by mouth daily before breakfast. May take 1 additional tablet if blood glucose is 180 to 200 or higher   metFORMIN 1000 MG tablet Commonly known as: GLUCOPHAGE Take 1,000 mg by mouth 2 (two) times daily.   methocarbamol 500 MG tablet Commonly known as: ROBAXIN Take 1 tablet (500 mg total) by mouth 2 (two) times daily.   NovoLOG FlexPen 100 UNIT/ML FlexPen Generic drug: insulin aspart Inject 30 Units into the  skin in the morning.   pantoprazole 40 MG tablet Commonly known as: PROTONIX TOMAR 1 TABLETA POR VIA ORAL UNA VEZ AL DIA What changed: See the new instructions.   predniSONE 20 MG tablet Commonly known as: DELTASONE Take 20 mg by mouth 2 (two) times daily.   Trulicity 1.5 MW/4.1LK Sopn Generic drug: Dulaglutide Inject 1.5 mg into the skin once a week. What changed: additional instructions   Ubrelvy 100 MG Tabs Generic drug: Ubrogepant TOMAR 1 TALBETA POR VIA ORAL UNA VEZ AL DIA        Follow-up Information     Maczis, Puja Gosai, PA-C. Go on 04/12/2022.   Specialty: General Surgery Why: at 10:15 AM for follow up from your recent surgery. please arrive 20 minutes early. Contact information: [place] [address] [phone]                No Known Allergies  The results of significant diagnostics from this hospitalization (including imaging, microbiology, ancillary and laboratory) are listed below for reference.    Microbiology: No results found for this or any previous visit (from the past 240 hour(s)).  Procedures/Studies: DG Cholangiogram Operative  Result Date: 03/20/2022 CLINICAL DATA:  Cholecystectomy. EXAM: INTRAOPERATIVE CHOLANGIOGRAM TECHNIQUE: Cholangiographic images from the C-arm fluoroscopic device were submitted for interpretation post-operatively. Please see the procedural report for the amount of contrast and the fluoroscopy time utilized. FLUOROSCOPY: Radiation Exposure Index (as provided by the fluoroscopic device): 2.8 mGy Kerma COMPARISON:  MRI and MRCP on 03/19/2022 FINDINGS: Intraoperative imaging with a C-arm demonstrates normal opacified cystic duct stump, common bile duct and visualized intrahepatic ducts. No evidence of biliary dilatation, stricture, filling defect or contrast extravasation. Contrast enters the duodenum. 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 IMPRESSION: Unremarkable  intraoperative cholangiogram without evidence choledocholithiasis. 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 Electronically Signed   By: Aletta Edouard M.D.   On: 03/20/2022 14:18   MR ABDOMEN MRCP W WO CONTAST  Result Date: 03/19/2022 CLINICAL DATA:  Acute hepatitis. Elevated LFTs with possible choledocholithiasis. EXAM: MRI ABDOMEN WITHOUT AND WITH CONTRAST (INCLUDING MRCP) TECHNIQUE: Multiplanar multisequence MR imaging of the abdomen was performed both before and after the administration of intravenous contrast. Heavily T2-weighted images of the biliary and pancreatic ducts were obtained, and three-dimensional MRCP images were rendered by post processing. CONTRAST:  7.87m GADAVIST GADOBUTROL 1 MMOL/ML IV SOLN COMPARISON:  CT from 03/19/2022 FINDINGS: Exam detail is diminished due to considerable motion artifact. Lower chest: No acute abnormality. Hepatobiliary: Hepatic steatosis is identified. There is scratch set assessment for underlying enhancing liver lesions is significantly diminished by motion artifact. No large enhancing liver masses  identified. Small stones are noted layering within the dependent portion of the gallbladder which measure up to 8 mm, image 15/8. No significant gallbladder wall thickening, pericholecystic inflammation or free fluid. Mild intrahepatic bile duct dilatation, image 14/5. The common bile duct measures 8 mm in maximum diameter. There is no convincing evidence for choledocholithiasis. There is a duodenal diverticulum which wraps around the distal common bile duct, image 24/9 and image 14/8. On the coronal T2 weighted images there is a structure which exerts mass effect upon the distal common bile duct measuring approximately 11 mm, image 14/8. Although limited by motion artifact, this appears to represent portions of the duodenal diverticula. Pancreas:  No main duct dilatation, inflammation or mass. Spleen:  Within normal limits in size and appearance. Adrenals/Urinary Tract:  Normal adrenal glands. No kidney mass or hydronephrosis identified. Stomach/Bowel: Stomach appears nondistended. 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 No dilated loops of large or small bowel. No signs of bowel wall thickening or inflammation. Vascular/Lymphatic: Aortic atherosclerotic calcifications. No aneurysm. The upper abdominal vascularity appears patent. No abdominal adenopathy. Other:  No significant free fluid or fluid collections. Musculoskeletal: No suspicious bone lesions identified. IMPRESSION: 1. Exam detail is diminished due to motion artifact. 2. Gallstones. No evidence for choledocholithiasis. 3. There is a structure within the region of the head of pancreas at the level of the duodenal diverticulum which has mass effect upon the distal common bile duct. The duodenal diverticulum extends around the distal common bile duct at this level. According to the literature there have been reports of extra and intrahepatic bile duct dilatation secondary to compression of the intrapancreatic portions of the common bile duct by a periampullary diverticula. As this study is limited by excessive motion artifact, when the patient is clinically stable and able to remain motionless follow-up imaging is advised to exclude underlying mass lesion in this area. 4. Hepatic steatosis. Electronically Signed   By: Kerby Moors M.D.   On: 03/19/2022 12:43   MR 3D Recon At Scanner  Result Date: 03/19/2022 CLINICAL DATA:  Acute hepatitis. Elevated LFTs with possible choledocholithiasis. EXAM: MRI ABDOMEN WITHOUT AND WITH CONTRAST (INCLUDING MRCP) TECHNIQUE: Multiplanar multisequence MR imaging of the abdomen was performed both before and after the administration of intravenous contrast. Heavily T2-weighted images of the biliary and pancreatic ducts were obtained, and three-dimensional MRCP images were rendered by post processing. CONTRAST:  7.58mL GADAVIST GADOBUTROL 1 MMOL/ML IV SOLN  COMPARISON:  CT from 03/19/2022 FINDINGS: Exam detail is diminished due to considerable motion artifact. Lower chest: No acute abnormality. Hepatobiliary: Hepatic steatosis is identified. There is scratch set assessment for underlying enhancing liver lesions is significantly diminished by motion artifact. No large enhancing liver masses identified. Small stones are noted layering within the dependent portion of the gallbladder which measure up to 8 mm, image 15/8. No significant gallbladder wall thickening, pericholecystic inflammation or free fluid. Mild intrahepatic bile duct dilatation, image 14/5. The common bile duct measures 8 mm in maximum diameter. There is no convincing evidence for choledocholithiasis. There is a duodenal diverticulum which wraps around the distal common bile duct, image 24/9 and image 14/8. On the coronal T2 weighted images there is a structure which exerts mass effect upon the distal common bile duct measuring approximately 11 mm, image 14/8. Although limited by motion artifact, this appears to represent portions of the duodenal diverticula. Pancreas:  No main duct dilatation, inflammation or mass. Spleen:  Within normal limits in size and appearance. Adrenals/Urinary  Tract: Normal adrenal glands. No kidney mass or hydronephrosis identified. Stomach/Bowel: Stomach appears nondistended. 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 No dilated loops of large or small bowel. No signs of bowel wall thickening or inflammation. Vascular/Lymphatic: Aortic atherosclerotic calcifications. No aneurysm. The upper abdominal vascularity appears patent. No abdominal adenopathy. Other:  No significant free fluid or fluid collections. Musculoskeletal: No suspicious bone lesions identified. IMPRESSION: 1. Exam detail is diminished due to motion artifact. 2. Gallstones. No evidence for choledocholithiasis. 3. There is a structure within the region of the head of pancreas at the level  of the duodenal diverticulum which has mass effect upon the distal common bile duct. The duodenal diverticulum extends around the distal common bile duct at this level. According to the literature there have been reports of extra and intrahepatic bile duct dilatation secondary to compression of the intrapancreatic portions of the common bile duct by a periampullary diverticula. As this study is limited by excessive motion artifact, when the patient is clinically stable and able to remain motionless follow-up imaging is advised to exclude underlying mass lesion in this area. 4. Hepatic steatosis. Electronically Signed   By: Kerby Moors M.D.   On: 03/19/2022 12:43   CT T-SPINE NO CHARGE  Result Date: 03/19/2022 CLINICAL DATA:  Trauma/fall, chest pain EXAM: CT Thoracic and Lumbar spine with contrast TECHNIQUE: Multiplanar CT images of the thoracic and lumbar spine were reconstructed from contemporary CT of the Chest, Abdomen, and Pelvis. RADIATION DOSE REDUCTION: This exam was performed according to the departmental dose-optimization program which includes automated exposure control, adjustment of the mA and/or kV according to patient size and/or use of iterative reconstruction technique. CONTRAST:  No additional COMPARISON:  Concurrent CT chest abdomen pelvis FINDINGS: CT THORACIC SPINE FINDINGS Alignment: Normal thoracic kyphosis. Vertebrae: No acute fracture or focal pathologic process. Paraspinal and other soft tissues: Evaluated on dedicated CT chest. Disc levels: Mild degenerative changes of the lower thoracic spine. Spinal canal is patent. CT LUMBAR SPINE FINDINGS Segmentation: 5 lumbar type vertebral bodies. Alignment: Normal lumbar lordosis. Vertebrae: No acute fracture or focal pathologic process. Paraspinal and other soft tissues: Evaluated on dedicated CT abdomen/pelvis. Disc levels: Mild degenerative changes.  Spinal canal is patent. IMPRESSION: No traumatic injury to the thoracic or lumbar spine.  Mild degenerative changes. Electronically Signed   By: Julian Hy M.D.   On: 03/19/2022 03:51   CT L-SPINE NO CHARGE  Result Date: 03/19/2022 CLINICAL DATA:  Trauma/fall, chest pain EXAM: CT Thoracic and Lumbar spine with contrast TECHNIQUE: Multiplanar CT images of the thoracic and lumbar spine were reconstructed from contemporary CT of the Chest, Abdomen, and Pelvis. RADIATION DOSE REDUCTION: This exam was performed according to the departmental dose-optimization program which includes automated exposure control, adjustment of the mA and/or kV according to patient size and/or use of iterative reconstruction technique. CONTRAST:  No additional COMPARISON:  Concurrent CT chest abdomen pelvis FINDINGS: CT THORACIC SPINE FINDINGS Alignment: Normal thoracic kyphosis. Vertebrae: No acute fracture or focal pathologic process. Paraspinal and other soft tissues: Evaluated on dedicated CT chest. Disc levels: Mild degenerative changes of the lower thoracic spine. Spinal canal is patent. CT LUMBAR SPINE FINDINGS Segmentation: 5 lumbar type vertebral bodies. Alignment: Normal lumbar lordosis. Vertebrae: No acute fracture or focal pathologic process. Paraspinal and other soft tissues: Evaluated on dedicated CT abdomen/pelvis. Disc levels: Mild degenerative changes.  Spinal canal is patent. IMPRESSION: No traumatic injury to the thoracic or lumbar spine. Mild degenerative changes. Electronically Signed  By: Julian Hy M.D.   On: 03/19/2022 03:51   CT CHEST ABDOMEN PELVIS W CONTRAST  Result Date: 03/19/2022 CLINICAL DATA:  Fall, chest pain EXAM: CT CHEST, ABDOMEN, AND PELVIS WITH CONTRAST TECHNIQUE: Multidetector CT imaging of the chest, abdomen and pelvis was performed following the standard protocol during bolus administration of intravenous contrast. RADIATION DOSE REDUCTION: This exam was performed according to the departmental dose-optimization program which includes automated exposure control,  adjustment of the mA and/or kV according to patient size and/or use of iterative reconstruction technique. CONTRAST:  150m OMNIPAQUE IOHEXOL 300 MG/ML  SOLN COMPARISON:  CTA chest abdomen pelvis dated 10/19/2021 FINDINGS: CT CHEST FINDINGS Cardiovascular: No evidence of traumatic aortic injury. Mild atherosclerotic calcifications of the arch. Heart is normal in size.  No pericardial effusion. Mediastinum/Nodes: No evidence of intra mediastinal hematoma. Small mediastinal lymph nodes not meet pathologic CT size criteria. Visualized thyroid is unremarkable. Lungs/Pleura: Moderate centrilobular and paraseptal emphysematous changes, upper lung predominant. No focal consolidation or aspiration. No suspicious pulmonary nodules. No pleural effusion or pneumothorax. Musculoskeletal: No fracture is seen. Sternum, clavicles, and scapulae are intact. Bilateral ribs are intact. Dedicated thoracic spine evaluation has been performed and will be reported separately. CT ABDOMEN PELVIS FINDINGS Hepatobiliary: Liver is within normal limits. No perihepatic fluid/hemorrhage. Mild layering gallstones (series 2/image 70). Mild central intrahepatic ductal prominence. Common duct measures 8 mm, at the upper limits of normal. Although poorly visualized on CT, a small distal CBD stone is suspected (series 2/image 75). Pancreas: Within normal limits. Spleen: Within normal limits. Adrenals/Urinary Tract: Adrenal glands are within normal limits. Subcentimeter medial left upper pole renal cyst (series 2/image 68), measuring simple fluid density, benign (Bosniak I). No follow-up is recommended. Right kidney is within normal limits. No hydronephrosis. Mildly thick-walled bladder, although underdistended. Stomach/Bowel: Stomach is within normal limits. No evidence of bowel obstruction. Duodenal diverticulum. Normal appendix (series 2/image 36). Sigmoid diverticulosis, without evidence of diverticulitis. Vascular/Lymphatic: No evidence of  abdominal aortic aneurysm. 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 Atherosclerotic calcifications of the abdominal aorta and branch vessels. No suspicious abdominopelvic lymphadenopathy. Reproductive: Prostate is unremarkable. Other: No abdominopelvic ascites. No hemoperitoneum or free air. Musculoskeletal: No fracture is seen. Visualized bony pelvis and bilateral proximal femurs are intact. Old left superior and inferior pubic rami fracture deformities. Dedicated lumbar spine evaluation has been performed and will be reported separately. IMPRESSION: No evidence of acute traumatic injury to the chest, abdomen, or pelvis. Cholelithiasis with suspected mild choledocholithiasis. Associated mild central intrahepatic ductal dilatation. Common duct measures 8 mm, at the upper limits of normal. Correlate with LFTs and consider ERCP as clinically warranted. Dedicated thoracolumbar spine evaluation has been performed and will be reported separately. Electronically Signed   By: SJulian HyM.D.   On: 03/19/2022 03:49   DG Chest 2 View  Result Date: 03/18/2022 CLINICAL DATA:  Chest pain.  Status post fall. EXAM: CHEST - 2 VIEW COMPARISON:  12/08/2021 FINDINGS: Heart size and mediastinal contours are unremarkable. There is no pleural effusion or edema. No airspace opacities identified. Chronic interstitial changes of COPD/emphysema noted. Visualized osseous structures appear intact. IMPRESSION: 1. No acute cardiopulmonary abnormalities. 2. COPD/emphysema. Electronically Signed   By: TKerby MoorsM.D.   On: 03/18/2022 20:54   DG Wrist Complete Left  Result Date: 03/14/2022 CLINICAL DATA:  Fall. Patient was walking down a ramp from storage building and slid. EXAM: LEFT WRIST - COMPLETE 3+ VIEW COMPARISON:  None Available. FINDINGS: There is a rounded  corticated osseous fragment between the scaphoid and lunate, likely suggesting sequela of prior trauma. There is also small osseous  fragment about the ulnar styloid process. No evidence of acute fracture or dislocation. Joint space narrowing of the radiocarpal joint suggesting arthritis, likely posttraumatic. IMPRESSION: 1.  No acute fracture or dislocation. 2. Rounded osseous fragment between the scaphoid and lunate as well as small osseous fragment about the ulnar styloid process, likely sequela of prior trauma. Correlate with clinical history. Electronically Signed   By: Keane Police D.O.   On: 03/14/2022 15:53   DG Ribs Unilateral W/Chest Left  Result Date: 03/14/2022 CLINICAL DATA:  Fall.  Left chest pain. EXAM: LEFT RIBS AND CHEST - 3+ VIEW COMPARISON:  None Available. FINDINGS: No fracture or other bone lesions are seen involving the ribs. Surgical anchors projecting over the left humeral head suggesting prior rotator cuff repair. There is no evidence of pneumothorax or pleural effusion. Left basilar calcified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

## 2022-03-23 ENCOUNTER — Telehealth: Payer: Self-pay

## 2022-03-23 NOTE — Telephone Encounter (Signed)
Transition Care Management Unsuccessful Follow-up Telephone Call  Date of discharge and from where:    Attempts:  1st Attempt  Reason for unsuccessful TCM follow-up call:  Left voice message- with interpreter  Transition Care Management Unsuccessful Follow-up Telephone Call  Date of discharge and from where:  [place] 03-22-22 Dx : biliary obstruction   Attempts:  2nd Attempt  Reason for unsuccessful TCM follow-up call:  Left voice message-with interpreter

## 2022-03-28 ENCOUNTER — Encounter: Payer: Self-pay | Admitting: Emergency Medicine

## 2022-03-28 ENCOUNTER — Ambulatory Visit (INDEPENDENT_AMBULATORY_CARE_PROVIDER_SITE_OTHER): Payer: Medicare HMO

## 2022-03-28 ENCOUNTER — Ambulatory Visit (INDEPENDENT_AMBULATORY_CARE_PROVIDER_SITE_OTHER): Payer: Medicare HMO | Admitting: Emergency Medicine

## 2022-03-28 VITALS — BP 122/70 | HR 107 | Temp 98.3°F | Ht 68.0 in | Wt 158.1 lb

## 2022-03-28 DIAGNOSIS — E785 Hyperlipidemia, unspecified: Secondary | ICD-10-CM

## 2022-03-28 DIAGNOSIS — Z09 Encounter for follow-up examination after completed treatment for conditions other than malignant neoplasm: Secondary | ICD-10-CM

## 2022-03-28 DIAGNOSIS — E1169 Type 2 diabetes mellitus with other specified complication: Secondary | ICD-10-CM

## 2022-03-28 DIAGNOSIS — J449 Chronic obstructive pulmonary disease, unspecified: Secondary | ICD-10-CM | POA: Diagnosis not present

## 2022-03-28 DIAGNOSIS — E1159 Type 2 diabetes mellitus with other circulatory complications: Secondary | ICD-10-CM

## 2022-03-28 DIAGNOSIS — S472XXA Crushing injury of left shoulder and upper arm, initial encounter: Secondary | ICD-10-CM

## 2022-03-28 DIAGNOSIS — M19012 Primary osteoarthritis, left shoulder: Secondary | ICD-10-CM | POA: Diagnosis not present

## 2022-03-28 DIAGNOSIS — I152 Hypertension secondary to endocrine disorders: Secondary | ICD-10-CM

## 2022-03-28 NOTE — Assessment & Plan Note (Signed)
Stable.  Diet and nutrition discussed. Continue atorvastatin 20 mg daily. 

## 2022-03-28 NOTE — Assessment & Plan Note (Signed)
Status post fall 2 weeks ago with persistent pain and decreased range of motion and affecting quality of life. X-ray negative for fracture. Sports medicine referral placed today. Pain management discussed.

## 2022-03-28 NOTE — Patient Instructions (Signed)
Esguince de hombro Shoulder Sprain  Un esguince de hombro es un desgarro parcial o total de uno de los tejidos resistentes parecidos a fibras (ligamentos) del hombro. Los ligamentos del hombro ayudan a que este se Firefighter. Cules son las causas? Esta afeccin puede ser causada por lo siguiente: Ardelia Mems cada. Un golpe en el hombro. Una torcedura del brazo. Qu incrementa el riesgo? Es ms probable que tenga esta afeccin si: Practica deportes. Tiene problemas de equilibrio o coordinacin. Cules son los signos o sntomas? Los sntomas de esta afeccin incluyen: Dolor al [employer]. Disminucin de la capacidad para Banker. Hinchazn y dolor con la palpacin en la parte superior del hombro. Sensacin de Air cabin crew. Un cambio en la forma del hombro. Enrojecimiento o hematomas en el hombro. Cmo se diagnostica? Esta afeccin se diagnostica mediante: Un examen fsico. Durante el examen, pueden pedirle que haga ejercicios sencillos con el hombro. Estudios de diagnstico por imgenes, como radiografas, una resonancia magntica (RM) o una exploracin por tomografa computarizada (TC). Estos estudios pueden revelar qu tan grave es Psychologist, sport and exercise. Cmo se trata? El tratamiento de esta afeccin puede incluir: Reposo. Analgsicos. Hielo. Un cabestrillo o un dispositivo ortopdico. Se utiliza para inmovilizar el brazo durante la recuperacin del hombro. Fisioterapia o ejercicios de rehabilitacin. Ayudan a mejorar la amplitud de movimiento y la fuerza del hombro. Ciruga (poco frecuente). Puede ser necesario realizar una ciruga si el esguince caus la inestabilidad de una articulacin. Adems, la Sales promotion account executive. Algunas personas pueden tener dolor de hombro constante o perder parte de la amplitud de movimiento del hombro. Sin embargo, la Personal assistant no presenta problemas a Barrister's clerk. Siga estas indicaciones en su casa:  Si tiene un cabestrillo  o un dispositivo ortopdico: selos como se lo haya indicado el mdico. Quteselos solamente como se lo haya indicado el mdico. Afloje el cabestrillo o el dispositivo ortopdico si los dedos de la mano se le entumecen, siente hormigueos o se le enfran y se tornan de Optician, dispensing. Mantenga limpio el cabestrillo o el dispositivo ortopdico. Si el cabestrillo o el dispositivo ortopdico no son impermeables: No deje que se mojen. Cbralos con un envoltorio hermtico cuando tome un bao de inmersin o una ducha. Actividad Relaje el hombro. Mueva el brazo solamente en la medida que el mdico se lo haya indicado, pero mueva la mano y los dedos con frecuencia para evitar que se le pongan rgidos y se hinchen. Retome sus actividades normales segn lo indicado por el mdico. Pregntele al mdico qu actividades son seguras para usted. Pregntele al mdico cundo puede volver a conducir si tiene un cabestrillo o un dispositivo ortopdico en el hombro. Si le explicaron cmo realizar los ejercicios, hgalos como se lo haya indicado el mdico. Indicaciones generales Si se lo indican, aplique hielo sobre la zona afectada. Ponga el hielo en una bolsa plstica. Coloque una toalla entre la piel y Therapist, nutritional. Aplique el hielo durante 20 minutos, 2 a 3 veces por da. Use los medicamentos de venta libre y los recetados solamente como se lo haya indicado el mdico. No consuma ningn producto que contenga nicotina o tabaco, como cigarrillos, cigarrillos electrnicos y tabaco de Higher education careers adviser. Estos pueden retrasar la recuperacin. Si necesita ayuda para dejar de consumir estos productos, consulte al mdico. Concurra a todas las visitas de seguimiento como se lo haya indicado el mdico. Esto es importante. Comunquese con un mdico si: El Holiday representative. El dolor no se [place]  con los medicamentos. Aumentan el enrojecimiento o la hinchazn. Solicite ayuda de inmediato si: Tiene fiebre. No puede mover el brazo o el  hombro. Tiene adormecimiento u hormigueo intensos en el brazo, la mano o los dedos. El brazo, la mano o los dedos se sienten fros y se tornan de color [place], [place] o [place]. Resumen Un esguince de hombro es un desgarro parcial o total de uno de los tejidos resistentes parecidos a fibras (ligamentos) del hombro. Esta afeccin puede ser causada por una cada, un golpe en el hombro o una torcedura del brazo. El tratamiento generalmente incluye reposo, hielo y analgsicos segn sea necesario. Si tiene un cabestrillo o un dispositivo ortopdico, selo como se lo haya indicado el mdico. Quteselos solamente como se lo haya indicado el mdico. Esta informacin no tiene Marine scientist el consejo del mdico. Asegrese de hacerle al mdico cualquier pregunta que tenga. Document Revised: 04/01/2021 Document Reviewed: 04/01/2021 Elsevier Patient Education  [place].

## 2022-03-28 NOTE — Progress Notes (Signed)
Duane Cuevas 67 y.o.   Chief Complaint  Patient presents with   Hospitalization Follow-up    Pt wants his kidney to check due having kidney stone which led him to the hospital    Shoulder Pain    Left shoulder, taking oxycodone which is not helping     HISTORY OF PRESENT ILLNESS: This is a 66 y.o. male here for hospital discharge follow-up. Recently admitted with abdominal pain and had cholecystectomy done.  Recovering well. History of diabetes.  Compliant with medications. Accidental fall about 2 weeks ago with left shoulder injury.  Still complaining of persistent pain and very decreased range of motion. No other complaints or medical concerns today.  Discharge summary as follows: Physician Discharge Summary  Duane Cuevas SWH:[id number] DOB: 10-11-1955 DOA: 03/18/2022   PCP: [name], MD   Admit date: 03/18/2022 Discharge date: 03/22/2022 Recommendations for Outpatient Follow-up:  Follow up with PCP and general surgery in 1 weeks-call for appointment Please obtain BMP/CBC in one week   Discharge Dispo: Home Discharge Condition: Stable Code Status:   Code Status: Full Code Diet recommendation:  Diet Order                  DIET SOFT Room service appropriate? Yes; Fluid consistency: Thin  Diet effective now                         Brief/Interim Summary: a 66 y.o. male with medical history significant of HTN, HLD, tobacco abuse, COPD, DM2 presented with epigastric pain x onset 3 days PTA and  symptoms worsened 9/15. In the ED, blood work-up showed abnormal LFTs, mild leukocytosis lipase 29 chest x-ray COPD/emphysema, CT abdomen pelvis cholelithiasis with suspected mild choledocholithiasis CBD 8 mm.  Patient admitted GI consulted, MRCP ordered-that was motion artifact, gallstone, no choledocholithiasis, structure noted in the region of head of pancreas at the level of the duodenum diverticulum with mass effect upon the distal common bile duct, hepatic  steatosis.  GI reviewed MRCP felt that this is more of a symptomatic cholelithiasis > seen by surgery underwent laparoscopic cholecystectomy with [facility] 9/17 Dr. Harlow Asa.   Postop patient had sharp pain felt bloated monitor overnight. Now he is tolerating diet, passing gas,had bowel movement,feels well enough to go home today   Discharge Diagnoses:  Principal Problem:   Biliary obstruction Active Problems:   DM2 (diabetes mellitus, type 2) (HCC)   Cigarette smoker   HTN (hypertension)   COPD (chronic obstructive pulmonary disease) (HCC)   HLD (hyperlipidemia)   Choledocholithiasis with obstruction   Fall   Cholecystitis, acute   Symptomatic cholelithiasis Abnormal LFTs: CT abdomen pelvis cholelithiasis with suspected mild choledocholithiasis CBD 8 mm>MRCP shows gallstone, no choledocholithiasis,structure noted in the region of head of pancreas at the level of the duodenum diverticulum with mass effect upon the distal common bile duct, hepatic steatosis.  GI reviewed MRCP felt that this is more of a symptomatic cholelithiasis advised surgical consultation >  underwent laparoscopic cholecystectomy with [facility] 9/17 Dr. Harlow Asa.  Overnight afebrile but having a lot of pain with nausea> likely postop. TODAY resting comfortably passing gas had BM yesterday pain significantly improved today tolerating diet.  LFTs have improved.  Discussed with general surgery and okay for discharge.    COPD Tobacco abuse: Stable not in exacerbation continue Brovana.   Essential hypertension HLD: BP well controlled.continue amlodipine.  Holding statin   T2DM: Last A1c 6.6 on 6/8, blood sugar stable,continue sliding scale.  Hold off metformin and glipizide   Consults: General surgery, GI Subjective: Alert awake resting comfortably passing gas had BM yesterday pain significantly improved today tolerating diet.  Would like to go home. Discussed with surgery and okay for discharge   Discharge Exam:     Vitals:     03/22/22 0457 03/22/22 0731  BP: 107/76    Pulse: 88    Resp:      Temp: 98.1 F (36.7 C)    SpO2: 93% 90%    General: Pt is alert, awake, not in acute distress Cardiovascular: RRR, S1/S2 +, no rubs, no gallops Respiratory: CTA bilaterally, no wheezing, no rhonchi Abdominal: Soft, NT, ND, bowel sounds + Extremities: no edema, no cyanosis   Discharge Instructions   Discharge Instructions       Discharge instructions   Complete by: As directed      Follow-up with general surgery as scheduled.  Follow-up with PCP in 1 week.   Please call call MD or return to ER for similar or worsening recurring problem that brought you to hospital or if any fever,nausea/vomiting,abdominal pain, uncontrolled pain, chest pain,  shortness of breath or any other alarming symptoms.   Please follow-up your doctor as instructed in a week time and call the office for appointment.   Please avoid alcohol, smoking, or any other illicit substance and maintain healthy habits including taking your regular medications as prescribed.   You were cared for by a hospitalist during your hospital stay. If you have any questions about your discharge medications or the care you received while you were in the hospital after you are discharged, you can call the unit and ask to speak with the hospitalist on call if the hospitalist that took care of you is not available.   Once you are discharged, your primary care physician will handle any further medical issues. Please note that NO REFILLS for any discharge medications will be authorized once you are discharged, as it is imperative that you return to your primary care physician (or establish a relationship with a primary care physician if you do not have one) for your aftercare needs so that they can reassess your need for medications and monitor your lab values    Increase activity slowly   Complete by: As directed      No wound care   Complete by: As directed            Allergies as of 03/22/2022   No Known Allergies         Medication List       STOP taking these medications     doxycycline 100 MG tablet Commonly known as: VIBRA-TABS           TAKE these medications     Accu-Chek Guide test strip Generic drug: glucose blood Use as instructed    albuterol 108 (90 Base) MCG/ACT inhaler Commonly known as: VENTOLIN HFA Inhale 2 puffs into the lungs every 6 (six) hours as needed for wheezing or shortness of breath. What changed: Another medication with the same name was changed. Make sure you understand how and when to take each.    albuterol (2.5 MG/3ML) 0.083% nebulizer solution Commonly known as: PROVENTIL USE 1 VIAL VIA NEBULIZER EVERY 6 HOURS AS NEEDED FOR WHEEZING OR SHORTNESS OF BREATH What changed:  how much to take how to take this when to take this reasons to take this    amLODipine 5 MG tablet Commonly known as: NORVASC TOMAR  1 TABLETA POR VAI ORAL UNA VEZ AL DIA What changed: See the new instructions.    atorvastatin 20 MG tablet Commonly known as: LIPITOR TAKE ONE TABLET BY MOUTH ONE TIME DAILY    B-D ULTRAFINE III SHORT PEN 31G X 8 MM Misc Generic drug: Insulin Pen Needle USAR PARA INYECTAR INSULINA    B-D ULTRAFINE III SHORT PEN 31G X 8 MM Misc Generic drug: Insulin Pen Needle USAR PARA INYECTAR INSULINA    benzonatate 100 MG capsule Commonly known as: Tessalon Perles Take 1 capsule (100 mg total) by mouth 3 (three) times daily as needed for cough.    blood glucose meter kit and supplies Dispense based on patient and insurance preference. Use up to four times daily as directed. (FOR ICD-10 E10.9, E11.9).    blood glucose meter kit and supplies Kit Dispense based on patient and insurance preference. Use up to four times daily as directed.    Breztri Aerosphere 160-9-4.8 MCG/ACT Aero Generic drug: Budeson-Glycopyrrol-Formoterol Inhale 2 puffs into the lungs 2 (two) times daily. What changed:  when to take  this reasons to take this    cyclobenzaprine 10 MG tablet Commonly known as: FLEXERIL Take 1 tablet (10 mg total) by mouth 2 (two) times daily as needed for muscle spasms.    Farxiga 10 MG Tabs tablet Generic drug: dapagliflozin propanediol Take 10 mg by mouth daily.    glipiZIDE 10 MG tablet Commonly known as: Glucotrol Take 1 tablet (10 mg total) by mouth daily before breakfast. May take 1 additional tablet if blood glucose is 180 to 200 or higher    metFORMIN 1000 MG tablet Commonly known as: GLUCOPHAGE Take 1,000 mg by mouth 2 (two) times daily.    methocarbamol 500 MG tablet Commonly known as: ROBAXIN Take 1 tablet (500 mg total) by mouth 2 (two) times daily.    NovoLOG FlexPen 100 UNIT/ML FlexPen Generic drug: insulin aspart Inject 30 Units into the skin in the morning.    pantoprazole 40 MG tablet Commonly known as: PROTONIX TOMAR 1 TABLETA POR VIA ORAL UNA VEZ AL DIA What changed: See the new instructions.    predniSONE 20 MG tablet Commonly known as: DELTASONE Take 20 mg by mouth 2 (two) times daily.    Trulicity 1.5 EY/8.1KG Sopn Generic drug: Dulaglutide Inject 1.5 mg into the skin once a week. What changed: additional instructions    Ubrelvy 100 MG Tabs Generic drug: Ubrogepant TOMAR 1 TALBETA POR VIA ORAL UNA VEZ AL DIA             Follow-up Information       Maczis, Puja Gosai, PA-C. Go on 04/12/2022.   Specialty: General Surgery Why: at 10:15 AM for follow up from your recent surgery. please arrive 20 minutes early. Contact information: [address] [place]  [phone]                    HPI   Prior to Admission medications   Medication Sig Start Date End Date Taking? Authorizing Provider  albuterol (PROVENTIL) (2.5 MG/3ML) 0.083% nebulizer solution USE 1 VIAL VIA NEBULIZER EVERY 6 HOURS AS NEEDED FOR WHEEZING OR SHORTNESS OF BREATH Patient taking differently: Take 2.5 mg by nebulization every 6 (six)  hours as needed for shortness of breath. USE 1 VIAL VIA NEBULIZER EVERY 6 HOURS AS NEEDED FOR WHEEZING OR SHORTNESS OF BREATH 05/24/21  Yes [name], MD  albuterol (VENTOLIN HFA) 108 (90 Base) MCG/ACT inhaler  Inhale 2 puffs into the lungs every 6 (six) hours as needed for wheezing or shortness of breath. 04/15/21  Yes Rhaelyn Giron, Ines Bloomer, MD  amLODipine (NORVASC) 5 MG tablet TOMAR 1 TABLETA POR VAI ORAL UNA VEZ AL DIA Patient taking differently: Take 5 mg by mouth daily. 10/13/21  Yes Yolandra Habig, Ines Bloomer, MD  atorvastatin (LIPITOR) 20 MG tablet TAKE ONE TABLET BY MOUTH ONE TIME DAILY Patient taking differently: Take 20 mg by mouth daily. 08/09/21  Yes Haislee Corso, Ines Bloomer, MD  benzonatate (TESSALON PERLES) 100 MG capsule Take 1 capsule (100 mg total) by mouth 3 (three) times daily as needed for cough. 12/22/21 12/22/22 Yes Debbera Wolken, Ines Bloomer, MD  blood glucose meter kit and supplies KIT Dispense based on patient and insurance preference. Use up to four times daily as directed. 10/22/20  Yes Kateleen Encarnacion, Ines Bloomer, MD  blood glucose meter kit and supplies Dispense based on patient and insurance preference. Use up to four times daily as directed. (FOR ICD-10 E10.9, E11.9). 10/24/20  Yes Jessly Lebeck, Ines Bloomer, MD  Budeson-Glycopyrrol-Formoterol (BREZTRI AEROSPHERE) 160-9-4.8 MCG/ACT AERO Inhale 2 puffs into the lungs 2 (two) times daily. Patient taking differently: Inhale 2 puffs into the lungs daily as needed (for shortness of breath). 07/22/21  Yes Carolin Quang, Ines Bloomer, MD  cyclobenzaprine (FLEXERIL) 10 MG tablet Take 1 tablet (10 mg total) by mouth 2 (two) times daily as needed for muscle spasms. 10/19/21  Yes Dorie Rank, MD  Dulaglutide (TRULICITY) 1.5 XK/4.8JE SOPN Inject 1.5 mg into the skin once a week. Patient taking differently: Inject 1.5 mg into the skin once a week. Thursday 08/10/21  Yes Glanda Spanbauer, Ines Bloomer, MD  FARXIGA 10 MG TABS tablet Take 10 mg by mouth daily. 11/09/21  Yes  [provider]  glipiZIDE (GLUCOTROL) 10 MG tablet Take 1 tablet (10 mg total) by mouth daily before breakfast. May take 1 additional tablet if blood glucose is 180 to 200 or higher 03/03/22  Yes Hudsyn Champine, [place], MD  glucose blood (ACCU-CHEK GUIDE) test strip Use as instructed 12/22/21  Yes Tanija Germani, Ines Bloomer, MD  Insulin Pen Needle (B-D ULTRAFINE III SHORT PEN) 31G X 8 MM MISC USAR PARA INYECTAR INSULINA 10/13/21  Yes Ashok Sawaya, Ines Bloomer, MD  Insulin Pen Needle (B-D ULTRAFINE III SHORT PEN) 31G X 8 MM MISC USAR PARA INYECTAR INSULINA 10/13/21  Yes Kahliyah Dick, Ines Bloomer, MD  methocarbamol (ROBAXIN) 500 MG tablet Take 1 tablet (500 mg total) by mouth 2 (two) times daily. 03/14/22  Yes Prosperi, Christian H, PA-C  NOVOLOG FLEXPEN 100 UNIT/ML FlexPen Inject 30 Units into the skin in the morning. 01/12/22  Yes [provider]  oxyCODONE (OXY IR/ROXICODONE) 5 MG immediate release tablet Take 1 tablet (5 mg total) by mouth every 6 (six) hours as needed for breakthrough pain. 03/22/22  Yes Maczis, Barth Kirks, PA-C  pantoprazole (PROTONIX) 40 MG tablet TOMAR 1 TABLETA POR VIA ORAL UNA VEZ AL DIA Patient taking differently: Take 40 mg by mouth daily. 01/24/22  Yes Blaine Guiffre, Ines Bloomer, MD  UBRELVY 100 MG TABS TOMAR 1 TALBETA POR VIA ORAL UNA VEZ AL DIA 02/09/22  Yes Janith Lima, MD    No Known Allergies  Patient Active Problem List   Diagnosis Date Noted   Cholecystitis, acute    Biliary obstruction 03/19/2022   Choledocholithiasis with obstruction 03/19/2022   Fall 03/19/2022   Elevated LFTs    Abnormal CT of the abdomen    Acute cough 12/22/2021   COPD exacerbation ([place])  12/09/2021   Seizure ([place]) 12/09/2021   Sebaceous cyst 08/10/2021   Intractable persistent migraine aura without cerebral infarction and with status migrainosus 05/26/2021   HTN (hypertension) 09/21/2020   Other dysphagia    Uncontrolled type 2 diabetes mellitus with hyperglycemia ([place]) 09/13/2020    CAD (coronary artery disease) 01/23/2020   HLD (hyperlipidemia) 11/02/2019   History of MI (myocardial infarction) 11/02/2019   Cigarette smoker 08/15/2018   COPD ? GOLD III/ active smoker 08/14/2018   History of diet-controlled diabetes 06/30/2018   COPD (chronic obstructive pulmonary disease) ([place]) 10/14/2014   DM2 (diabetes mellitus, type 2) ([place]) 10/14/2014    Past Medical History:  Diagnosis Date   Diabetes mellitus ([place]) 10/2014   pt denies being diabetic.    Emphysema of lung ([place])    Emphysema/COPD ([place]) 10/2014   Hepatic steatosis    Thrombocytopenia ([place]) 09/2015   platelets in 120s.     Past Surgical History:  Procedure Laterality Date   BIOPSY  09/16/2020   Procedure: BIOPSY;  Surgeon: Thornton Park, MD;  Location: [facility];  Service: Gastroenterology;;   CHOLECYSTECTOMY N/A 03/20/2022   Procedure: LAPAROSCOPIC CHOLECYSTECTOMY;  Surgeon: Armandina Gemma, MD;  Location: [facility];  Service: General;  Laterality: N/A;   ESOPHAGOGASTRODUODENOSCOPY N/A 09/23/2015   Procedure: ESOPHAGOGASTRODUODENOSCOPY (EGD);  Surgeon: Ladene Artist, MD;  Location: [facility];  Service: Endoscopy;  Laterality: N/A;   ESOPHAGOGASTRODUODENOSCOPY (EGD) WITH PROPOFOL N/A 09/16/2020   Procedure: ESOPHAGOGASTRODUODENOSCOPY (EGD) WITH PROPOFOL;  Surgeon: Thornton Park, MD;  Location: [facility];  Service: Gastroenterology;  Laterality: N/A;   FOOT SURGERY     HERNIA REPAIR     INGUINAL HERNIA REPAIR Left 05/15/2018   Procedure: LEFT INGUINAL HERNIA REPAIR WITH MESH;  Surgeon: Erroll Luna, MD;  Location: [facility];  Service: General;  Laterality: Left;   INSERTION OF MESH Left 05/15/2018   Procedure: INSERTION OF MESH;  Surgeon: Erroll Luna, MD;  Location: [facility];  Service: General;  Laterality: Left;   INTRAOPERATIVE CHOLANGIOGRAM N/A 03/20/2022   Procedure: INTRAOPERATIVE CHOLANGIOGRAM;  Surgeon: Armandina Gemma, MD;  Location: [facility];  Service: General;  Laterality: N/A;   PELVIC  FRACTURE SURGERY     UPPER GASTROINTESTINAL ENDOSCOPY      Social History   Socioeconomic History   Marital status: Married    Spouse name: Zoraida   Number of children: 6   Years of education: Not on file   Highest education level: Not on file  Occupational History   Occupation: Employed    Employer:  [employer]  Tobacco Use   Smoking status: Former    Packs/day: 0.15    Years: 47.00    Total pack years: 7.05    Types: Cigarettes   Smokeless tobacco: Never  Vaping Use   Vaping Use: Never used  Substance and Sexual Activity   Alcohol use: Yes    Comment: occ   Drug use: No   Sexual activity: Never  Other Topics Concern   Not on file  Social History Narrative   Lives with wife.  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   Social Determinants of Health   Financial Resource Strain: High Risk (01/14/2021)   Overall Financial Resource Strain (CARDIA)    Difficulty of Paying Living Expenses: Hard  Food Insecurity: No Food Insecurity (03/19/2022)   Hunger Vital Sign    Worried About Running Out of Food in the Last Year: Never true    Ran Out of Food in the Last Year:  Never true  Transportation Needs: No Transportation Needs (03/19/2022)   PRAPARE - Hydrologist (Medical): No    Lack of Transportation (Non-Medical): No  Physical Activity: Sufficiently Active (08/09/2021)   Exercise Vital Sign    Days of Exercise per Week: 5 days    Minutes of Exercise per Session: 30 min  Stress: No Stress Concern Present (08/09/2021)   [place]    Feeling of Stress : Not at all  Social Connections: Moderately Isolated (08/09/2021)   Social Connection and Isolation Panel [NHANES]    Frequency of Communication with Friends and Family: More than three times a week    Frequency of Social Gatherings with Friends and Family: More than three times a week    Attends Religious Services: Never     Marine scientist or Organizations: No    Attends Archivist Meetings: Never    Marital Status: Married  Human resources officer Violence: Not At Risk (03/19/2022)   Humiliation, Afraid, Rape, and Kick questionnaire    Fear of Current or Ex-Partner: No    Emotionally Abused: No    Physically Abused: No    Sexually Abused: No    Family History  Problem Relation Age of Onset   Emphysema Mother    Diabetes Mother    Emphysema Father    Cancer Sister        Stomach cancer   Diabetes Sister    Stomach cancer Sister    Diabetes Brother    Colon cancer Neg Hx    Colon polyps Neg Hx    Esophageal cancer Neg Hx    Rectal cancer Neg Hx      Review of Systems  Constitutional: Negative.  Negative for chills and fever.  HENT: Negative.  Negative for congestion and sore throat.   Respiratory: Negative.  Negative for cough and shortness of breath.   Cardiovascular: Negative.  Negative for chest pain.  Gastrointestinal:  Negative for abdominal pain, nausea and vomiting.  Genitourinary: Negative.   Musculoskeletal:  Positive for joint pain (Left shoulder pain).  Skin: Negative.  Negative for rash.  Neurological: Negative.  Negative for dizziness and headaches.  All other systems reviewed and are negative.  Today's Vitals   03/28/22 0956  BP: 122/70  Pulse: (!) 107  Temp: 98.3 F (36.8 C)  TempSrc: Oral  SpO2: 92%  Weight: 158 lb 2 oz (71.7 kg)  Height: $Remove'5\' 8"'eXpSnFq$  (1.727 m)   Body mass index is 24.04 kg/m.   Physical Exam Vitals reviewed.  Constitutional:      Appearance: Normal appearance.  HENT:     Head: Normocephalic.  Eyes:     Extraocular Movements: Extraocular movements intact.     Pupils: Pupils are equal, round, and reactive to light.  Cardiovascular:     Rate and Rhythm: Normal rate and regular rhythm.     Pulses: Normal pulses.     Heart sounds: Normal heart sounds.  Pulmonary:     Effort: Pulmonary effort is normal.     Breath sounds: Normal breath  sounds.  Abdominal:     Palpations: Abdomen is soft.     Tenderness: There is no abdominal tenderness.  Musculoskeletal:     Cervical back: No tenderness.     Comments: Left shoulder: Some tenderness to [facility] joint.  Decreased range of motion.  Unable to abduct arm above 90 degree angle  Lymphadenopathy:     Cervical:  No cervical adenopathy.  Skin:    General: Skin is warm and dry.     Capillary Refill: Capillary refill takes less than 2 seconds.  Neurological:     General: No focal deficit present.     Mental Status: He is alert and oriented to person, place, and time.  Psychiatric:        Mood and Affect: Mood normal.        Behavior: Behavior normal.    DG Shoulder Left  Result Date: 03/28/2022 CLINICAL DATA:  66 year old male with left shoulder injury EXAM: LEFT SHOULDER - 2+ VIEW COMPARISON:  03/14/2022 FINDINGS: Surgical changes at the head of the humerus again demonstrated. No acute displaced fracture. Glenohumeral joint appears congruent. Degenerative changes of the [facility] joint. No focal soft tissue swelling. Small radiopaque focus within the anterior soft tissues present on comparison. IMPRESSION: Negative for acute bony abnormality. Redemonstration of surgical changes. Degenerative changes of the [facility] joint Electronically Signed   By: Corrie Mckusick D.O.   On: 03/28/2022 10:45     ASSESSMENT & PLAN: A total of 49 minutes was spent with the patient and counseling/coordination of care regarding preparing for this visit, review of most recent office visit note, review of recent discharge summary from the hospital, review of multiple chronic medical problems and their management, review of most recent blood work results, review of all medications, education on nutrition, left shoulder injury and review of x-ray report, need for orthopedic evaluation, pain management, prognosis, documentation, and need for follow-up.  Problem List Items Addressed This Visit       Cardiovascular and  Mediastinum   Hypertension associated with diabetes ([place])    Well-controlled hypertension. BP Readings from Last 3 Encounters:  03/28/22 122/70  03/22/22 107/76  03/14/22 116/84  Continue amlodipine 5 mg daily. Well-controlled diabetes. Lab Results  Component Value Date   HGBA1C 6.8 (H) 03/20/2022  Continue NovoLog insulin 30 units daily, glipizide 10 mg in the morning, Farxiga 10 mg daily, and weekly Trulicity 1.5 mg Diet and nutrition discussed. Cardio vascular risks associated with hypertension and diabetes discussed.          Respiratory   COPD (chronic obstructive pulmonary disease) ([place])    Well-controlled.  Continue Breztri twice a day.        Endocrine   Dyslipidemia associated with type 2 diabetes mellitus ([place])    Stable.  Diet and nutrition discussed. Continue atorvastatin 20 mg daily.        Other   Crushing injury of left shoulder    Status post fall 2 weeks ago with persistent pain and decreased range of motion and affecting quality of life. X-ray negative for fracture. Sports medicine referral placed today. Pain management discussed.      Relevant Orders   DG Shoulder Left (Completed)   Ambulatory referral to Sports Medicine   Other Visit Diagnoses     Hospital discharge follow-up    -  Primary      Patient Instructions  Esguince de hombro Shoulder Sprain  Un esguince de hombro es un desgarro parcial o total de uno de los tejidos resistentes parecidos a fibras (ligamentos) del hombro. Los ligamentos del hombro ayudan a que este se Firefighter. Cules son las causas? Esta afeccin puede ser causada por lo siguiente: Ardelia Mems cada. Un golpe en el hombro. Una torcedura del brazo. Qu incrementa el riesgo? Es ms probable que tenga esta afeccin si: Practica deportes. Tiene problemas de equilibrio o coordinacin. Cules  son los signos o sntomas? Los sntomas de esta afeccin incluyen: Dolor al [employer]. Disminucin de la  capacidad para Banker. Hinchazn y dolor con la palpacin en la parte superior del hombro. Sensacin de Air cabin crew. Un cambio en la forma del hombro. Enrojecimiento o hematomas en el hombro. Cmo se diagnostica? Esta afeccin se diagnostica mediante: Un examen fsico. Durante el examen, pueden pedirle que haga ejercicios sencillos con el hombro. Estudios de diagnstico por imgenes, como radiografas, una resonancia magntica (RM) o una exploracin por tomografa computarizada (TC). Estos estudios pueden revelar qu tan grave es Psychologist, sport and exercise. Cmo se trata? El tratamiento de esta afeccin puede incluir: Reposo. Analgsicos. Hielo. Un cabestrillo o un dispositivo ortopdico. Se utiliza para inmovilizar el brazo durante la recuperacin del hombro. Fisioterapia o ejercicios de rehabilitacin. Ayudan a mejorar la amplitud de movimiento y la fuerza del hombro. Ciruga (poco frecuente). Puede ser necesario realizar una ciruga si el esguince caus la inestabilidad de una articulacin. Adems, la Sales promotion account executive. Algunas personas pueden tener dolor de hombro constante o perder parte de la amplitud de movimiento del hombro. Sin embargo, la Personal assistant no presenta problemas a Barrister's clerk. Siga estas indicaciones en su casa:  Si tiene un cabestrillo o un dispositivo ortopdico: selos como se lo haya indicado el mdico. Quteselos solamente como se lo haya indicado el mdico. Afloje el cabestrillo o el dispositivo ortopdico si los dedos de la mano se le entumecen, siente hormigueos o se le enfran y se tornan de Optician, dispensing. Mantenga limpio el cabestrillo o el dispositivo ortopdico. Si el cabestrillo o el dispositivo ortopdico no son impermeables: No deje que se mojen. Cbralos con un envoltorio hermtico cuando tome un bao de inmersin o una ducha. Actividad Relaje el hombro. Mueva el brazo solamente en la medida que el mdico se lo haya indicado, pero mueva la  mano y los dedos con frecuencia para evitar que se le pongan rgidos y se hinchen. Retome sus actividades normales segn lo indicado por el mdico. Pregntele al mdico qu actividades son seguras para usted. Pregntele al mdico cundo puede volver a conducir si tiene un cabestrillo o un dispositivo ortopdico en el hombro. Si le explicaron cmo realizar los ejercicios, hgalos como se lo haya indicado el mdico. Indicaciones generales Si se lo indican, aplique hielo sobre la zona afectada. Ponga el hielo en una bolsa plstica. Coloque una toalla entre la piel y Therapist, nutritional. Aplique el hielo durante 20 minutos, 2 a 3 veces por da. Use los medicamentos de venta libre y los recetados solamente como se lo haya indicado el mdico. No consuma ningn producto que contenga nicotina o tabaco, como cigarrillos, cigarrillos electrnicos y tabaco de Higher education careers adviser. Estos pueden retrasar la recuperacin. Si necesita ayuda para dejar de consumir estos productos, consulte al mdico. Concurra a todas las visitas de seguimiento como se lo haya indicado el mdico. Esto es importante. Comunquese con un mdico si: El Holiday representative. El dolor no se alivia con los [employer]. Aumentan el enrojecimiento o la hinchazn. Solicite ayuda de inmediato si: Tiene fiebre. No puede mover el brazo o el hombro. Tiene adormecimiento u hormigueo intensos en el brazo, la mano o los dedos. El brazo, la mano o los dedos se sienten fros y se tornan de color [place]. Resumen Un esguince de hombro es un desgarro parcial o total de uno de los tejidos resistentes parecidos a fibras (ligamentos) del hombro. Esta afeccin puede ser causada  por Jeannett Senior, un golpe en el hombro o una torcedura del brazo. El tratamiento generalmente incluye reposo, hielo y analgsicos segn sea necesario. Si tiene un cabestrillo o un dispositivo ortopdico, selo como se lo haya indicado el mdico. Quteselos solamente como se lo haya indicado el  mdico. Esta informacin no tiene Marine scientist el consejo del mdico. Asegrese de hacerle al mdico cualquier pregunta que tenga. Document Revised: 04/01/2021 Document Reviewed: 04/01/2021 Elsevier Patient Education  [place], MD [facility]

## 2022-03-28 NOTE — Assessment & Plan Note (Signed)
Well-controlled.  Continue Breztri twice a day.

## 2022-03-28 NOTE — Assessment & Plan Note (Signed)
Well-controlled hypertension. BP Readings from Last 3 Encounters:  03/28/22 122/70  03/22/22 107/76  03/14/22 116/84  Continue amlodipine 5 mg daily. Well-controlled diabetes. Lab Results  Component Value Date   HGBA1C 6.8 (H) 03/20/2022  Continue NovoLog insulin 30 units daily, glipizide 10 mg in the morning, Farxiga 10 mg daily, and weekly Trulicity 1.5 mg Diet and nutrition discussed. Cardio vascular risks associated with hypertension and diabetes discussed.

## 2022-03-29 ENCOUNTER — Other Ambulatory Visit: Payer: Self-pay | Admitting: Emergency Medicine

## 2022-03-29 ENCOUNTER — Telehealth: Payer: Self-pay | Admitting: Emergency Medicine

## 2022-03-29 DIAGNOSIS — I152 Hypertension secondary to endocrine disorders: Secondary | ICD-10-CM

## 2022-03-29 MED ORDER — TRESIBA FLEXTOUCH 100 UNIT/ML ~~LOC~~ SOPN: 30.0000 [IU] | PEN_INJECTOR | Freq: Every day | SUBCUTANEOUS | 3 refills | Status: DC

## 2022-03-29 NOTE — Telephone Encounter (Signed)
We saw him recently.  He is on NovoLog insulin.  Where is the [place] question coming from?  Please find out.

## 2022-03-29 NOTE — Telephone Encounter (Signed)
What dose?

## 2022-03-29 NOTE — Telephone Encounter (Signed)
New prescription for Tresiba sent to pharmacy of record.  NovoLog discontinued.

## 2022-03-29 NOTE — Telephone Encounter (Signed)
Patient needs treseba refilled.

## 2022-03-30 ENCOUNTER — Ambulatory Visit (INDEPENDENT_AMBULATORY_CARE_PROVIDER_SITE_OTHER): Payer: Medicare HMO | Admitting: Sports Medicine

## 2022-03-30 VITALS — BP 118/80 | HR 97 | Ht 68.0 in | Wt 158.0 lb

## 2022-03-30 DIAGNOSIS — S40022A Contusion of left upper arm, initial encounter: Secondary | ICD-10-CM | POA: Diagnosis not present

## 2022-03-30 DIAGNOSIS — R29898 Other symptoms and signs involving the musculoskeletal system: Secondary | ICD-10-CM | POA: Diagnosis not present

## 2022-03-30 DIAGNOSIS — M25512 Pain in left shoulder: Secondary | ICD-10-CM

## 2022-03-30 MED ORDER — MELOXICAM 15 MG PO TABS: 15.0000 mg | ORAL_TABLET | Freq: Every day | ORAL | 0 refills | Status: DC

## 2022-03-30 MED ORDER — TRAMADOL HCL 50 MG PO TABS: 50.0000 mg | ORAL_TABLET | Freq: Three times a day (TID) | ORAL | 0 refills | Status: AC | PRN

## 2022-03-30 MED ORDER — CYCLOBENZAPRINE HCL 10 MG PO TABS: 10.0000 mg | ORAL_TABLET | Freq: Three times a day (TID) | ORAL | 0 refills | Status: DC | PRN

## 2022-03-30 NOTE — Progress Notes (Signed)
[facility] C[facility]vas [facility] [address] [place] [address] Phone: [phone]   Assessment and Plan:     1. Acute pain of left shoulder 2. Left arm weakness 3. Arm bruise, left, initial encounter  -Acute, complicated, initial sports medicine visit - Highly concerning for rotator cuff complete tear based on significantly limited ROM, general shoulder weakness, bruising throughout left upper extremity, significant MOI with fall downstairs, failure to improve with conservative therapy - Reviewed x-ray with patient in clinic which showed no acute fracture and in place prior surgical clips Pain management with following medications: - Start Tylenol 500 to 1000 mg tablets 2-3 times a day for day-to-day pain relief - Start meloxicam 15 mg daily x2 weeks.  If still having pain after 2 weeks, complete 3rd-week of meloxicam. May use remaining meloxicam as needed once daily for pain control.  Do not to use additional NSAIDs while taking meloxicam. - Start Flexeril 10 mg 3 times daily for muscle spasms - Start tramadol 50 mg 3 times daily as needed for severe breakthrough pain -Recommend using sling at all times -We will obtain MRI follow-up 3 days after to review results  Pertinent previous records reviewed include x-ray left shoulder 03/28/2022   Follow Up: 3 days after MRI to review results and create treatment plan   Subjective:   I, Duane Cuevas, am serving as a Neurosurgeon for Doctor Duane Cuevas  Chief Complaint: left shoulder pain   HPI:   03/30/22 Patient is a 66 year old male complaining of left shoulder pain. Patient states accidental fall about 2 weeks ago with left shoulder injury fell down about 10/15 steps was wet when he fell.  Still complaining of persistent pain and very decreased range of motion do to pain . Yes numbness and tingling to the middle of the forearm, has taken everything and nothing take the pain away , muscle relaxer  doesn't help , states he had severe  bruising but it has resolved a little  Relevant Historical Information: CAD, hypertension, DM type II, COPD  Additional pertinent review of systems negative.   Current Outpatient Medications:    albuterol (PROVENTIL) (2.5 MG/3ML) 0.083% nebulizer solution, USE 1 VIAL VIA NEBULIZER EVERY 6 HOURS AS NEEDED FOR WHEEZING OR SHORTNESS OF BREATH (Patient taking differently: Take 2.5 mg by nebulization every 6 (six) hours as needed for shortness of breath. USE 1 VIAL VIA NEBULIZER EVERY 6 HOURS AS NEEDED FOR WHEEZING OR SHORTNESS OF BREATH), Disp: 150 mL, Rfl: 3   albuterol (VENTOLIN HFA) 108 (90 Base) MCG/ACT inhaler, Inhale 2 puffs into the lungs every 6 (six) hours as needed for wheezing or shortness of breath., Disp: 1 each, Rfl: 3   amLODipine (NORVASC) 5 MG tablet, TOMAR 1 TABLETA POR VAI ORAL UNA VEZ AL DIA (Patient taking differently: Take 5 mg by mouth daily.), Disp: 30 tablet, Rfl: 5   atorvastatin (LIPITOR) 20 MG tablet, TAKE ONE TABLET BY MOUTH ONE TIME DAILY (Patient taking differently: Take 20 mg by mouth daily.), Disp: 90 tablet, Rfl: 3   benzonatate (TESSALON PERLES) 100 MG capsule, Take 1 capsule (100 mg total) by mouth 3 (three) times daily as needed for cough., Disp: 20 capsule, Rfl: 1   blood glucose meter kit and supplies KIT, Dispense based on patient and insurance preference. Use up to four times daily as directed., Disp: 1 each, Rfl: 0   blood glucose meter kit and supplies, Dispense based on patient and insurance preference. Use  up to four times daily as directed. (FOR ICD-10 E10.9, E11.9)., Disp: 1 each, Rfl: 0   Budeson-Glycopyrrol-Formoterol (BREZTRI AEROSPHERE) 160-9-4.8 MCG/ACT AERO, Inhale 2 puffs into the lungs 2 (two) times daily. (Patient taking differently: Inhale 2 puffs into the lungs daily as needed (for shortness of breath).), Disp: 10.7 g, Rfl: 11   Dulaglutide (TRULICITY) 1.5 MP/5.3IR SOPN, Inject 1.5 mg into the skin once a week.  (Patient taking differently: Inject 1.5 mg into the skin once a week. Thursday), Disp: 6 mL, Rfl: 3   FARXIGA 10 MG TABS tablet, Take 10 mg by mouth daily., Disp: , Rfl:    glipiZIDE (GLUCOTROL) 10 MG tablet, Take 1 tablet (10 mg total) by mouth daily before breakfast. May take 1 additional tablet if blood glucose is 180 to 200 or higher, Disp: 60 tablet, Rfl: 1   glucose blood (ACCU-CHEK GUIDE) test strip, Use as instructed, Disp: 100 strip, Rfl: 11   insulin degludec (TRESIBA FLEXTOUCH) 100 UNIT/ML FlexTouch Pen, Inject 30 Units into the skin daily., Disp: 3 mL, Rfl: 3   Insulin Pen Needle (B-D ULTRAFINE III SHORT PEN) 31G X 8 MM MISC, USAR PARA INYECTAR INSULINA, Disp: 100 each, Rfl: 5   Insulin Pen Needle (B-D ULTRAFINE III SHORT PEN) 31G X 8 MM MISC, USAR PARA INYECTAR INSULINA, Disp: 100 each, Rfl: 5   meloxicam (MOBIC) 15 MG tablet, Take 1 tablet (15 mg total) by mouth daily., Disp: 30 tablet, Rfl: 0   methocarbamol (ROBAXIN) 500 MG tablet, Take 1 tablet (500 mg total) by mouth 2 (two) times daily., Disp: 20 tablet, Rfl: 0   oxyCODONE (OXY IR/ROXICODONE) 5 MG immediate release tablet, Take 1 tablet (5 mg total) by mouth every 6 (six) hours as needed for breakthrough pain., Disp: 15 tablet, Rfl: 0   pantoprazole (PROTONIX) 40 MG tablet, TOMAR 1 TABLETA POR VIA ORAL UNA VEZ AL DIA (Patient taking differently: Take 40 mg by mouth daily.), Disp: 90 tablet, Rfl: 3   traMADol (ULTRAM) 50 MG tablet, Take 1 tablet (50 mg total) by mouth every 8 (eight) hours as needed for up to 7 days., Disp: 21 tablet, Rfl: 0   UBRELVY 100 MG TABS, TOMAR 1 TALBETA POR VIA ORAL UNA VEZ AL DIA, Disp: 8 tablet, Rfl: 3   cyclobenzaprine (FLEXERIL) 10 MG tablet, Take 1 tablet (10 mg total) by mouth 3 (three) times daily as needed for muscle spasms., Disp: 30 tablet, Rfl: 0 No current facility-administered medications for this visit.  Facility-Administered Medications Ordered in Other Visits:    morphine 2 MG/ML  injection, , , ,    Objective:     Vitals:   03/30/22 1037  BP: 118/80  Pulse: 97  SpO2: 98%  Weight: 158 lb (71.7 kg)  Height: $Remove'5\' 8"'AHzNoGc$  (1.727 m)      Body mass index is 24.02 kg/m.    Physical Exam:    Gen: Appears well, nad, nontoxic and pleasant Neuro:sensation intact, strength is 5/5 with df/pf/inv/ev, muscle tone wnl Skin: no suspicious lesion or defmority Psych: A&O, appropriate mood and affect  Left shoulder: no deformity, swelling or muscle wasting.  Bruising resolving along left upper extremity globally No scapular winging FF 30, abd 30, int 20, ext 50 TTP AC, coracoid, biceps groove, humerus, deltoid, trapezius, left-sided cervical spine NTTP over the [place], clavicle,   Unable to perform special testing due to limited range of motion and severe pain   Negative Spurling's test bilat FROM of neck    Electronically signed  by:  Benito Mccreedy D.[facility] 11:11 AM 03/30/22

## 2022-03-30 NOTE — Patient Instructions (Addendum)
Good to see you  We are referring you for an MRI.  [facility] radiology will reach out to you to schedule an MRI appointment.  Once this appointment is scheduled for the MRI, call our clinic back and make a follow-up appointment for 3 days after the MRI is scheduled.  Pain management with following medications: - Start Tylenol 500 to 1000 mg tablets 2-3 times a day for day-to-day pain relief - Start meloxicam 15 mg daily x2 weeks.  If still having pain after 2 weeks, complete 3rd-week of meloxicam. May use remaining meloxicam as needed once daily for pain control.  Do not to use additional NSAIDs while taking meloxicam. - Start Flexeril 10 mg 3 times daily for muscle spasms - Start tramadol 50 mg 3 times daily as needed for severe breakthrough pain  Recommend using sling at all times

## 2022-04-08 ENCOUNTER — Other Ambulatory Visit: Payer: Self-pay | Admitting: Emergency Medicine

## 2022-04-08 DIAGNOSIS — I152 Hypertension secondary to endocrine disorders: Secondary | ICD-10-CM

## 2022-04-12 ENCOUNTER — Ambulatory Visit (INDEPENDENT_AMBULATORY_CARE_PROVIDER_SITE_OTHER): Payer: Medicare HMO | Admitting: Sports Medicine

## 2022-04-12 ENCOUNTER — Ambulatory Visit (INDEPENDENT_AMBULATORY_CARE_PROVIDER_SITE_OTHER): Payer: Medicare HMO

## 2022-04-12 DIAGNOSIS — Z9889 Other specified postprocedural states: Secondary | ICD-10-CM | POA: Diagnosis not present

## 2022-04-12 DIAGNOSIS — R29898 Other symptoms and signs involving the musculoskeletal system: Secondary | ICD-10-CM

## 2022-04-12 DIAGNOSIS — M25512 Pain in left shoulder: Secondary | ICD-10-CM | POA: Diagnosis not present

## 2022-04-12 DIAGNOSIS — S46012A Strain of muscle(s) and tendon(s) of the rotator cuff of left shoulder, initial encounter: Secondary | ICD-10-CM | POA: Diagnosis not present

## 2022-04-12 DIAGNOSIS — S40022A Contusion of left upper arm, initial encounter: Secondary | ICD-10-CM

## 2022-04-12 DIAGNOSIS — S472XXA Crushing injury of left shoulder and upper arm, initial encounter: Secondary | ICD-10-CM

## 2022-04-12 DIAGNOSIS — M19012 Primary osteoarthritis, left shoulder: Secondary | ICD-10-CM | POA: Diagnosis not present

## 2022-04-12 NOTE — Progress Notes (Signed)
    Procedures performed today:    Procedure: Real-time Ultrasound Guided gadolinium contrast injection of left glenohumeral joint Device: Samsung [id number]  Verbal informed consent obtained.  Time-out conducted.  Noted no overlying erythema, induration, or other signs of local infection.  Skin prepped in a sterile fashion.  Local anesthesia: Topical Ethyl chloride.  With sterile technique and under real time ultrasound guidance: Noted arthritic-appearing joint, 22-gauge spinal needle advanced into the joint from a posterior approach, I injected 1 cc kenalog 40, 2 cc lidocaine, 2 cc bupivacaine, syringe switched and 0.1 cc gadolinium injected, syringe again switched and 10 cc sterile saline used to fully distend the joint. Joint visualized and capsule seen distending confirming intra-articular placement of contrast material and medication. Completed without difficulty  Advised to call if fevers/chills, erythema, induration, drainage, or persistent bleeding.  Images permanently stored in PACS Impression: Technically successful ultrasound guided gadolinium contrast injection for MR arthrography.  Please see separate MR arthrogram report.  Independent interpretation of notes and tests performed by another provider:   None.  Brief History, Exam, Impression, and Recommendations:    Crushing injury of left shoulder Injection as above, for MR arthrography. Further management per primary treating provider.    ____________________________________________ Gwen Her. Dianah Field, M.D., ABFM., CAQSM., AME. Primary Care and Sports Medicine [facility]  Adjunct Professor of [place] [employer]  Risk manager

## 2022-04-12 NOTE — Assessment & Plan Note (Signed)
Injection as above, for MR arthrography. Further management per primary treating provider.

## 2022-04-14 NOTE — Progress Notes (Signed)
Duane Cuevas D.Boyce Duane Cuevas Phone: [phone]   Assessment and Plan:     1. Acute pain of left shoulder 2. Left arm weakness 3. Tear of left supraspinatus tendon 4. Tear of left infraspinatus tendon, initial encounter -Acute, complicated, subsequent visit - Reviewed MRI with patient with the help of interpreter.  Discussed full grade and complete tear of supraspinatus and 50% complete tear of the infraspinatus with accompanying tendinopathy's - Patient continues to have severe pain, and significant limitation in range of motion and function - I recommend that patient establish care with orthopedic surgery to discuss surgical options.  Patient in agreement.  Referral placed today.  Patient asks that they contact his daughter Duane Cuevas because she speaks [place] --Continue Tylenol 500 to 1000 mg tablets 2-3 times a day for day-to-day pain relief -Continue Flexeril 10 mg 3 times daily as needed for muscle spasms.  Refill provided - Continue tramadol 50 mg 3 times daily as needed for severe pain breakthrough.  Refill provided - traMADol (ULTRAM) 50 MG tablet; Take 1 tablet (50 mg total) by mouth every 8 (eight) hours as needed for up to 7 days for severe pain.   Patient accompanied by interpreter throughout entirety of exam.   Pertinent previous records reviewed include MRI shoulder 04/12/2022   Follow Up: As needed   Subjective:   I, Pincus Badder, am serving as a Education administrator for Doctor Glennon Mac   Chief Complaint: left shoulder pain    HPI:    03/30/22 Patient is a 66 year old male complaining of left shoulder pain. Patient states accidental fall about 2 weeks ago with left shoulder injury fell down about 10/15 steps was wet when he fell.  Still complaining of persistent pain and very decreased range of motion do to pain . Yes numbness and tingling to the middle of the forearm, has taken everything and  nothing take the pain away , muscle relaxer doesn't help , states he had severe  bruising but it has resolved a little   04/15/2022 Patient states that it still hurts when he lifts it  , bruising has gone away a little , the pain doesn't go away    Relevant Historical Information: CAD, hypertension, DM type II, COPD  Additional pertinent review of systems negative.   Current Outpatient Medications:    albuterol (PROVENTIL) (2.5 MG/3ML) 0.083% nebulizer solution, USE 1 VIAL VIA NEBULIZER EVERY 6 HOURS AS NEEDED FOR WHEEZING OR SHORTNESS OF BREATH (Patient taking differently: Take 2.5 mg by nebulization every 6 (six) hours as needed for shortness of breath. USE 1 VIAL VIA NEBULIZER EVERY 6 HOURS AS NEEDED FOR WHEEZING OR SHORTNESS OF BREATH), Disp: 150 mL, Rfl: 3   albuterol (VENTOLIN HFA) 108 (90 Base) MCG/ACT inhaler, Inhale 2 puffs into the lungs every 6 (six) hours as needed for wheezing or shortness of breath., Disp: 1 each, Rfl: 3   amLODipine (NORVASC) 5 MG tablet, TOMAR 1 TABLETA POR VAI ORAL UNA VEZ AL DIA (Patient taking differently: Take 5 mg by mouth daily.), Disp: 30 tablet, Rfl: 5   atorvastatin (LIPITOR) 20 MG tablet, TAKE ONE TABLET BY MOUTH ONE TIME DAILY (Patient taking differently: Take 20 mg by mouth daily.), Disp: 90 tablet, Rfl: 3   benzonatate (TESSALON PERLES) 100 MG capsule, Take 1 capsule (100 mg total) by mouth 3 (three) times daily as needed for cough., Disp: 20 capsule, Rfl: 1   blood glucose meter kit  and supplies KIT, Dispense based on patient and insurance preference. Use up to four times daily as directed., Disp: 1 each, Rfl: 0   blood glucose meter kit and supplies, Dispense based on patient and insurance preference. Use up to four times daily as directed. (FOR ICD-10 E10.9, E11.9)., Disp: 1 each, Rfl: 0   Budeson-Glycopyrrol-Formoterol (BREZTRI AEROSPHERE) 160-9-4.8 MCG/ACT AERO, Inhale 2 puffs into the lungs 2 (two) times daily. (Patient taking differently: Inhale  2 puffs into the lungs daily as needed (for shortness of breath).), Disp: 10.7 g, Rfl: 11   Dulaglutide (TRULICITY) 1.5 MG/0.5ML SOPN, Inject 1.5 mg into the skin once a week. (Patient taking differently: Inject 1.5 mg into the skin once a week. Thursday), Disp: 6 mL, Rfl: 3   FARXIGA 10 MG TABS tablet, Take 10 mg by mouth daily., Disp: , Rfl:    glipiZIDE (GLUCOTROL) 10 MG tablet, Take 1 tablet (10 mg total) by mouth daily before breakfast. May take 1 additional tablet if blood glucose is 180 to 200 or higher, Disp: 60 tablet, Rfl: 1   glucose blood (ACCU-CHEK GUIDE) test strip, Use as instructed, Disp: 100 strip, Rfl: 11   Insulin Pen Needle (B-D ULTRAFINE III SHORT PEN) 31G X 8 MM MISC, USAR PARA INYECTAR INSULINA, Disp: 100 each, Rfl: 5   Insulin Pen Needle (B-D ULTRAFINE III SHORT PEN) 31G X 8 MM MISC, USAR PARA INYECTAR INSULINA, Disp: 100 each, Rfl: 5   methocarbamol (ROBAXIN) 500 MG tablet, Take 1 tablet (500 mg total) by mouth 2 (two) times daily., Disp: 20 tablet, Rfl: 0   oxyCODONE (OXY IR/ROXICODONE) 5 MG immediate release tablet, Take 1 tablet (5 mg total) by mouth every 6 (six) hours as needed for breakthrough pain., Disp: 15 tablet, Rfl: 0   pantoprazole (PROTONIX) 40 MG tablet, TOMAR 1 TABLETA POR VIA ORAL UNA VEZ AL DIA (Patient taking differently: Take 40 mg by mouth daily.), Disp: 90 tablet, Rfl: 3   traMADol (ULTRAM) 50 MG tablet, Take 1 tablet (50 mg total) by mouth every 8 (eight) hours as needed for up to 7 days for severe pain., Disp: 21 tablet, Rfl: 0   TRESIBA FLEXTOUCH 100 UNIT/ML FlexTouch Pen, INYECTAR 25 UNIDADES POR VIA SUBCUTANEA DIARIAMENTE, Disp: 15 mL, Rfl: 10   UBRELVY 100 MG TABS, TOMAR 1 TALBETA POR VIA ORAL UNA VEZ AL DIA, Disp: 8 tablet, Rfl: 3   cyclobenzaprine (FLEXERIL) 10 MG tablet, Take 1 tablet (10 mg total) by mouth 3 (three) times daily as needed for muscle spasms., Disp: 30 tablet, Rfl: 0 No current facility-administered medications for this  visit.  Facility-Administered Medications Ordered in Other Visits:    morphine 2 MG/ML injection, , , ,    Objective:     Vitals:   04/15/22 0951  Pulse: 98  SpO2: 98%  Weight: 158 lb (71.7 kg)  Height: 5' 8" (1.727 m)      Body mass index is 24.02 kg/m.    Physical Exam:    Gen: Appears well, nad, nontoxic and pleasant Neuro:sensation intact, strength is 5/5 with df/pf/inv/ev, muscle tone wnl Skin: no suspicious lesion or defmority Psych: A&O, appropriate mood and affect   Left shoulder: no deformity, swelling or muscle wasting.  Bruising resolved along left upper extremity globally No scapular winging FF 30, abd 30, int 20, ext 50 TTP AC, coracoid, biceps groove, humerus, deltoid, trapezius, left-sided cervical spine NTTP over the [place], clavicle,   Unable to perform special testing due to limited range of motion   and severe pain   Negative Spurling's test bilat FROM of neck    Electronically signed by:  Duane Cuevas D.[facility] 10:11 AM 04/15/22

## 2022-04-15 ENCOUNTER — Ambulatory Visit (INDEPENDENT_AMBULATORY_CARE_PROVIDER_SITE_OTHER): Payer: Medicare HMO | Admitting: Sports Medicine

## 2022-04-15 VITALS — HR 98 | Ht 68.0 in | Wt 158.0 lb

## 2022-04-15 DIAGNOSIS — J439 Emphysema, unspecified: Secondary | ICD-10-CM | POA: Diagnosis not present

## 2022-04-15 DIAGNOSIS — M75102 Unspecified rotator cuff tear or rupture of left shoulder, not specified as traumatic: Secondary | ICD-10-CM

## 2022-04-15 DIAGNOSIS — R29898 Other symptoms and signs involving the musculoskeletal system: Secondary | ICD-10-CM

## 2022-04-15 DIAGNOSIS — M25512 Pain in left shoulder: Secondary | ICD-10-CM

## 2022-04-15 DIAGNOSIS — S46812A Strain of other muscles, fascia and tendons at shoulder and upper arm level, left arm, initial encounter: Secondary | ICD-10-CM | POA: Diagnosis not present

## 2022-04-15 MED ORDER — CYCLOBENZAPRINE HCL 10 MG PO TABS: 10.0000 mg | ORAL_TABLET | Freq: Three times a day (TID) | ORAL | 0 refills | Status: DC | PRN

## 2022-04-15 MED ORDER — TRAMADOL HCL 50 MG PO TABS: 50.0000 mg | ORAL_TABLET | Freq: Three times a day (TID) | ORAL | 0 refills | Status: AC | PRN

## 2022-04-15 NOTE — Patient Instructions (Addendum)
Good to see you  Orthopedic surgery referral  Contact daughter angelica  Use sling at all times  Tylenol [id number] mg 2-3 times a day for pain relief  Continue flexeril 10 mg 3x a day as needed for muscle spasms Continue tramadol 50 mg 3x a day as needed for severe breakthrough pain  Stop meloxicam  Follow up as needed

## 2022-04-29 ENCOUNTER — Encounter: Payer: Self-pay | Admitting: Orthopedic Surgery

## 2022-04-29 ENCOUNTER — Ambulatory Visit (INDEPENDENT_AMBULATORY_CARE_PROVIDER_SITE_OTHER): Payer: Medicare HMO | Admitting: Orthopedic Surgery

## 2022-04-29 DIAGNOSIS — M25512 Pain in left shoulder: Secondary | ICD-10-CM | POA: Diagnosis not present

## 2022-04-29 DIAGNOSIS — M12812 Other specific arthropathies, not elsewhere classified, left shoulder: Secondary | ICD-10-CM

## 2022-04-29 NOTE — Progress Notes (Addendum)
Office Visit Note   Patient: Duane Cuevas           Date of Birth: 1956-05-04           MRN: [id number] Visit Date: 04/29/2022 Requested by: Glennon Mac, [place],  [place] [address] PCP: [name], MD  Subjective: Chief Complaint  Patient presents with   Left Shoulder - Pain    HPI: Duane Cuevas is a 66 y.o. male who presents to the office reporting left shoulder pain.  Patient has a history of prior left shoulder rotator cuff repair.  Details of that are unavailable.  He describes having a fall on 03/14/2022 landing on his left shoulder.  Reports constant pain as well as diminished functional range of motion.  Tylenol and Ultram have not been helping.  He states that he really had no problem before surgery.  Currently he is not working.  He has been in a sling.  Pain wakes him from sleep at night.  He does have multiple medical problems including type 2 diabetes COPD hypertension and coronary artery disease.  He is here with a translator today.  He is also a smoker.              ROS: All systems reviewed are negative as they relate to the chief complaint within the history of present illness.  Patient denies fevers or chills.  Assessment & Plan: Visit Diagnoses:  1. 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  MRI scan is reviewed and demonstrates evidence of prior rotator cuff tear repair with new rotator cuff tearing and retraction of the rotator cuff at or near the level of the glenoid with some atrophy in the infraspinatus and supraspinatus.  Realistically his only option is reverse shoulder replacement.  The rationale of reverse shoulder replacement is discussed with the patient by the use of models.  The risk and benefits were also discussed including not limited to infection or vessel damage instability as well as potential need for  revision surgery.  Need for smoking cessation is also discussed in order to optimize outcome and minimize complications.  He will be set up for thin cut CT scan for patient specific instrumentation.  Please call [name] his daughter at [phone] to set that up.  He will also need medical risk stratification prior to surgery.  Patient understands risk benefits and wishes to proceed.  Anticipate getting this done likely before Christmas.  All questions answered  Follow-Up Instructions: No follow-ups on file.   Orders:  Orders Placed This Encounter  Procedures   CT SHOULDER LEFT WO CONTRAST   No orders of the defined types were placed in this encounter.     Procedures: No procedures performed   Clinical Data: No additional findings.  Objective: Vital Signs: There were no vitals taken for this visit.  Physical Exam:  Constitutional: Patient appears well-developed HEENT:  Head: Normocephalic Eyes:EOM are normal Neck: Normal range of motion Cardiovascular: Normal rate Pulmonary/chest: Effort normal Neurologic: Patient is alert Skin: Skin is warm Psychiatric: Patient has normal mood and affect  Ortho Exam: Ortho exam demonstrates well-healed surgical incision from prior arthroscopy.  He has diminished external rotation strength on the left compared to the right at 3- out of 5.  Active forward flexion and AB duction are both well below 90 degrees.  Passively his motion is 70/85/120.  Does have pain with left  shoulder passive range of motion.  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  Imaging: No results found.   PMFS History: Patient Active Problem List   Diagnosis Date Noted   Crushing injury of left shoulder 03/28/2022   COPD exacerbation ([facility]) 12/09/2021   Seizure ([facility]) 12/09/2021   Sebaceous cyst 08/10/2021   Intractable persistent  migraine aura without cerebral infarction and with status migrainosus 05/26/2021   Hypertension associated with diabetes ([facility]) 09/21/2020   Uncontrolled type 2 diabetes mellitus with hyperglycemia ([facility]) 09/13/2020   CAD (coronary artery disease) 01/23/2020   HLD (hyperlipidemia) 11/02/2019   History of MI (myocardial infarction) 11/02/2019   Cigarette smoker 08/15/2018   COPD ? GOLD III/ active smoker 08/14/2018   History of diet-controlled diabetes 06/30/2018   COPD (chronic obstructive pulmonary disease) ([facility]) 10/14/2014   Dyslipidemia associated with type 2 diabetes mellitus ([facility]) 10/14/2014   Past Medical History:  Diagnosis Date   Diabetes mellitus ([facility]) 10/2014   pt denies being diabetic.    Emphysema of lung ([place])    Emphysema/COPD ([facility]) 10/2014   Hepatic steatosis    Thrombocytopenia ([facility]) 09/2015   platelets in 120s.     Family History  Problem Relation Age of Onset   Emphysema Mother    Diabetes Mother    Emphysema Father    Cancer Sister        Stomach cancer   Diabetes Sister    Stomach cancer Sister    Diabetes Brother    Colon cancer Neg Hx    Colon polyps Neg Hx    Esophageal cancer Neg Hx    Rectal cancer Neg Hx     Past Surgical History:  Procedure Laterality Date   BIOPSY  09/16/2020   Procedure: BIOPSY;  Surgeon: Thornton Park, MD;  Location: [facility];  Service: Gastroenterology;;   CHOLECYSTECTOMY N/A 03/20/2022   Procedure: LAPAROSCOPIC CHOLECYSTECTOMY;  Surgeon: Armandina Gemma, MD;  Location: [facility];  Service: General;  Laterality: N/A;   ESOPHAGOGASTRODUODENOSCOPY N/A 09/23/2015   Procedure: ESOPHAGOGASTRODUODENOSCOPY (EGD);  Surgeon: Ladene Artist, MD;  Location: [facility];  Service: Endoscopy;  Laterality: N/A;   ESOPHAGOGASTRODUODENOSCOPY (EGD) WITH PROPOFOL N/A 09/16/2020   Procedure: ESOPHAGOGASTRODUODENOSCOPY (EGD) WITH PROPOFOL;  Surgeon: Thornton Park, MD;  Location: [facility];  Service: Gastroenterology;  Laterality: N/A;    FOOT SURGERY     HERNIA REPAIR     INGUINAL HERNIA REPAIR Left 05/15/2018   Procedure: LEFT INGUINAL HERNIA REPAIR WITH MESH;  Surgeon: Erroll Luna, MD;  Location: [facility];  Service: General;  Laterality: Left;   INSERTION OF MESH Left 05/15/2018   Procedure: INSERTION OF MESH;  Surgeon: Erroll Luna, MD;  Location: [facility];  Service: General;  Laterality: Left;   INTRAOPERATIVE CHOLANGIOGRAM N/A 03/20/2022   Procedure: INTRAOPERATIVE CHOLANGIOGRAM;  Surgeon: Armandina Gemma, MD;  Location: [facility];  Service: General;  Laterality: N/A;   PELVIC FRACTURE SURGERY     SHOULDER ARTHROSCOPY W/ ROTATOR CUFF REPAIR Left    UPPER GASTROINTESTINAL ENDOSCOPY     Social History   Occupational History   Occupation: Employed    Employer: [employer]  Tobacco Use   Smoking status: Former    Packs/day: 0.15    Years: 47.00    Total pack years: 7.05    Types: Cigarettes   Smokeless tobacco: Never  Vaping Use   Vaping Use: Never used  Substance and Sexual  Activity   Alcohol use: Yes    Comment: occ   Drug use: No   Sexual activity: Never

## 2022-05-04 ENCOUNTER — Telehealth: Payer: Self-pay | Admitting: Emergency Medicine

## 2022-05-04 NOTE — Telephone Encounter (Signed)
For our records:  We have received Pre-Op PW for the pt and it has been placed in both of Dr. Barry Brunner boxes.   When complete fax to [phone]

## 2022-05-05 NOTE — Telephone Encounter (Signed)
Faxed Surgical Clearance to [facility], Fax confirmation received

## 2022-05-10 ENCOUNTER — Other Ambulatory Visit: Payer: Self-pay | Admitting: Sports Medicine

## 2022-05-10 DIAGNOSIS — M75102 Unspecified rotator cuff tear or rupture of left shoulder, not specified as traumatic: Secondary | ICD-10-CM

## 2022-05-10 DIAGNOSIS — R29898 Other symptoms and signs involving the musculoskeletal system: Secondary | ICD-10-CM

## 2022-05-10 DIAGNOSIS — M25512 Pain in left shoulder: Secondary | ICD-10-CM

## 2022-05-10 DIAGNOSIS — S46812A Strain of other muscles, fascia and tendons at shoulder and upper arm level, left arm, initial encounter: Secondary | ICD-10-CM

## 2022-05-11 ENCOUNTER — Ambulatory Visit
Admission: RE | Admit: 2022-05-11 | Discharge: 2022-05-11 | Disposition: A | Discharge status: Home / Self Care | Payer: Medicare HMO | Source: Ambulatory Visit | Attending: Orthopedic Surgery | Admitting: Orthopedic Surgery

## 2022-05-11 DIAGNOSIS — S46012A Strain of muscle(s) and tendon(s) of the rotator cuff of left shoulder, initial encounter: Secondary | ICD-10-CM | POA: Diagnosis not present

## 2022-05-11 DIAGNOSIS — M25512 Pain in left shoulder: Secondary | ICD-10-CM

## 2022-05-16 NOTE — Progress Notes (Signed)
Hi Is he posted?

## 2022-06-06 ENCOUNTER — Other Ambulatory Visit (HOSPITAL_COMMUNITY): Payer: Self-pay | Admitting: Neurosurgery

## 2022-06-06 ENCOUNTER — Other Ambulatory Visit: Payer: Self-pay | Admitting: Emergency Medicine

## 2022-06-06 DIAGNOSIS — I671 Cerebral aneurysm, nonruptured: Secondary | ICD-10-CM

## 2022-06-07 ENCOUNTER — Telehealth: Payer: Self-pay | Admitting: Orthopedic Surgery

## 2022-06-07 NOTE — Telephone Encounter (Signed)
Pt's daughter returned call to Ander Purpura F. Please call daughter at [phone]

## 2022-06-10 ENCOUNTER — Other Ambulatory Visit: Payer: Self-pay | Admitting: Internal Medicine

## 2022-06-10 ENCOUNTER — Other Ambulatory Visit: Payer: Self-pay | Admitting: Emergency Medicine

## 2022-06-10 DIAGNOSIS — E1165 Type 2 diabetes mellitus with hyperglycemia: Secondary | ICD-10-CM

## 2022-06-10 DIAGNOSIS — G43511 Persistent migraine aura without cerebral infarction, intractable, with status migrainosus: Secondary | ICD-10-CM

## 2022-06-10 DIAGNOSIS — J439 Emphysema, unspecified: Secondary | ICD-10-CM

## 2022-06-11 ENCOUNTER — Ambulatory Visit: Payer: Medicare HMO

## 2022-06-13 ENCOUNTER — Ambulatory Visit (HOSPITAL_COMMUNITY)
Admission: RE | Admit: 2022-06-13 | Discharge: 2022-06-13 | Disposition: A | Discharge status: Home / Self Care | Payer: Medicare HMO | Source: Ambulatory Visit | Attending: Neurosurgery | Admitting: Neurosurgery

## 2022-06-13 DIAGNOSIS — I671 Cerebral aneurysm, nonruptured: Secondary | ICD-10-CM | POA: Diagnosis not present

## 2022-06-16 DIAGNOSIS — I671 Cerebral aneurysm, nonruptured: Secondary | ICD-10-CM | POA: Diagnosis not present

## 2022-06-20 ENCOUNTER — Other Ambulatory Visit: Payer: Self-pay | Admitting: Emergency Medicine

## 2022-07-11 ENCOUNTER — Other Ambulatory Visit: Payer: Self-pay | Admitting: Emergency Medicine

## 2022-07-11 ENCOUNTER — Other Ambulatory Visit: Payer: Self-pay

## 2022-07-11 ENCOUNTER — Telehealth: Payer: Self-pay

## 2022-07-11 DIAGNOSIS — I10 Essential (primary) hypertension: Secondary | ICD-10-CM

## 2022-07-11 DIAGNOSIS — G43511 Persistent migraine aura without cerebral infarction, intractable, with status migrainosus: Secondary | ICD-10-CM

## 2022-07-11 MED ORDER — AMLODIPINE BESYLATE 5 MG PO TABS: 5.0000 mg | ORAL_TABLET | Freq: Every day | ORAL | 5 refills | Status: DC

## 2022-07-11 MED ORDER — FARXIGA 10 MG PO TABS: 10.0000 mg | ORAL_TABLET | Freq: Every day | ORAL | 3 refills | Status: DC

## 2022-07-11 MED ORDER — GLIPIZIDE 10 MG PO TABS: ORAL_TABLET | ORAL | 1 refills | Status: DC

## 2022-07-11 MED ORDER — UBRELVY 100 MG PO TABS: ORAL_TABLET | ORAL | 3 refills | Status: DC

## 2022-07-11 NOTE — Telephone Encounter (Signed)
Patient is using sliding scale with specific instructions depending on pre-meal numbers.  Call pharmacy and ask them to clarify.

## 2022-07-11 NOTE — Telephone Encounter (Signed)
Edison Nasuti from [facility] called stating that there needs to be a max daily dose on the rx for   NOVOLOG FLEXPEN 100 UNIT/ML FlexPen   as they can not fill w/o this information.  Please call the pharmacy at [phone] with updated information.

## 2022-07-12 ENCOUNTER — Telehealth: Payer: Self-pay

## 2022-07-12 ENCOUNTER — Other Ambulatory Visit: Payer: Self-pay | Admitting: Emergency Medicine

## 2022-07-12 DIAGNOSIS — E1165 Type 2 diabetes mellitus with hyperglycemia: Secondary | ICD-10-CM

## 2022-07-12 MED ORDER — NOVOLOG FLEXPEN 100 UNIT/ML ~~LOC~~ SOPN: PEN_INJECTOR | SUBCUTANEOUS | 10 refills | Status: DC

## 2022-07-12 NOTE — Progress Notes (Signed)
Surgical Instructions    Your procedure is scheduled on Tuesday January 16th.  Report to [facility] Main Entrance "A" at 9 A.M., then check in with the Admitting office.  Call this number if you have problems the morning of surgery:  [phone]   If you have any questions prior to your surgery date call [phone]: Open Monday-Friday 8am-4pm If you experience any cold or flu symptoms such as cough, fever, chills, shortness of breath, etc. between now and your scheduled surgery, please notify us at the above number     Remember:  Do not eat after midnight the night before your surgery  You may drink clear liquids until 8am the morning of your surgery.   Clear liquids allowed are: Water, Non-Citrus Juices (without pulp), Carbonated Beverages, Clear Tea, Black Coffee ONLY (NO MILK, CREAM OR POWDERED CREAMER of any kind), and Gatorade   Enhanced Recovery after Surgery for Orthopedics Enhanced Recovery after Surgery is a protocol used to improve the stress on your body and your recovery after surgery.  Patient Instructions  The day of surgery (if you have diabetes):  Drink ONE small 10 oz bottle of water by __8___ am the morning of surgery This bottle was given to you during your hospital  pre-op appointment visit.  Nothing else to drink after completing the  Small 10 oz bottle of water.         If you have questions, please contact your surgeon's office.    Take these medicines the morning of surgery with A SIP OF WATER: amLODipine (NORVASC) 5 MG tablet  atorvastatin (LIPITOR) 20 MG tablet  pantoprazole (PROTONIX) 40 MG tablet     IF NEEDED  albuterol (PROVENTIL) (2.5 MG/3ML) 0.083% nebulizer solution  albuterol (VENTOLIN HFA) 108 (90 Base) MCG/ACT inhaler  Budeson-Glycopyrrol-Formoterol (BREZTRI AEROSPHERE) 160-9-4.8 MCG/ACT AERO  Ubrogepant (UBRELVY) 100 MG TABS    Please bring inhalers with you to the hospital  As of today, STOP taking any Aspirin (unless  otherwise instructed by your surgeon) Aleve, Naproxen, Ibuprofen, Motrin, Advil, Goody's, BC's, all herbal medications, fish oil, and all vitamins.  WHAT DO I DO ABOUT MY DIABETES MEDICATION?   Do not take oral diabetes medicines (Farxiga/Glipizide) the morning of surgery.  HOLD your FARXIGA for 3 days / 72 hours prior to surgery  HOLD your Trulicity for 7 days prior to surgery  THE NIGHT BEFORE SURGERY, DO NOT take any Novolog nsulin.      The day of surgery, do not take other diabetes injectables, including Byetta (exenatide), Bydureon (exenatide ER), Victoza (liraglutide), or Trulicity (dulaglutide).  On day of surgery, If your CBG is greater than 220 mg/dL, you may take  of your sliding scale (correction) dose of insulin.   HOW TO MANAGE YOUR DIABETES BEFORE AND AFTER SURGERY  Why is it important to control my blood sugar before and after surgery? Improving blood sugar levels before and after surgery helps healing and can limit problems. A way of improving blood sugar control is eating a healthy diet by:  Eating less sugar and carbohydrates  Increasing activity/exercise  Talking with your doctor about reaching your blood sugar goals High blood sugars (greater than 180 mg/dL) can raise your risk of infections and slow your recovery, so you will need to focus on controlling your diabetes during the weeks before surgery. Make sure that the doctor who takes care of your diabetes knows about your planned surgery including the date and location.  How do I manage my blood sugar  before surgery? Check your blood sugar at least 4 times a day, starting 2 days before surgery, to make sure that the level is not too high or low.  Check your blood sugar the morning of your surgery when you wake up and every 2 hours until you get to the Short Stay unit.  If your blood sugar is less than 70 mg/dL, you will need to treat for low blood sugar: Do not take insulin. Treat a low blood sugar (less  than 70 mg/dL) with  cup of clear juice (cranberry or apple), 4 glucose tablets, OR glucose gel. Recheck blood sugar in 15 minutes after treatment (to make sure it is greater than 70 mg/dL). If your blood sugar is not greater than 70 mg/dL on recheck, call [phone] for further instructions. Report your blood sugar to the short stay nurse when you get to Short Stay.  If you are admitted to the hospital after surgery: Your blood sugar will be checked by the staff and you will probably be given insulin after surgery (instead of oral diabetes medicines) to make sure you have good blood sugar levels. The goal for blood sugar control after surgery is 80-180 mg/dL.  DAY OF SURGERY         Do not wear jewelry or makeup. Do not wear lotions, powders, cologne or deodorant. Do not shave 48 hours prior to surgery.  Men may shave face and neck. Do not bring valuables to the hospital. Do not wear nail polish  [facility] is not responsible for any belongings or valuables.    Do NOT Smoke (Tobacco/Vaping)  24 hours prior to your procedure  If you use a CPAP at night, you may bring your mask for your overnight stay.   Contacts, glasses, hearing aids, dentures or partials may not be worn into surgery, please bring cases for these belongings   For patients admitted to the hospital, discharge time will be determined by your treatment team.   Patients discharged the day of surgery will not be allowed to drive home, and someone needs to stay with them for 24 hours.   SURGICAL WAITING ROOM VISITATION Patients having surgery or a procedure may have no more than 2 support people in the waiting area - these visitors may rotate.   Children under the age of 45 must have an adult with them who is not the patient. If the patient needs to stay at the hospital during part of their recovery, the visitor guidelines for inpatient rooms apply. Pre-op nurse will coordinate an appropriate time for 1 support person  to accompany patient in pre-op.  This support person may not rotate.   Please refer to [email or web address] for the visitor guidelines for Inpatients (after your surgery is over and you are in a regular room).    Special instructions:    Oral Hygiene is also important to reduce your risk of infection.  Remember - BRUSH YOUR TEETH THE MORNING OF SURGERY WITH YOUR REGULAR TOOTHPASTE  [place] [place]- Preparing for Shoulder Surgery  ?  Before surgery, you can play an important role. Because skin is not sterile, your skin needs to be as free of germs as possible. You can reduce the number of germs on your skin by using the following products.   1). Benzoyl Peroxide Gel: reduces the number of germs present on the skin   *Applied twice a day to shoulder area starting two days before surgery     2). Chlorhexidine Gluconate (CHG) Soap:  An antiseptic cleaner that kills germs and bonds with the skin to continue killing germs even after washing   *Used for showering the night before surgery and morning of surgery   ?    Please follow these instructions carefully:     1). BENZOYL PEROXIDE 5% GEL (Please do not use if you have an allergy to benzoyl peroxide.   If your skin becomes reddened/irritated stop using the benzoyl peroxide)     Starting TWO DAYS BEFORE surgery:    Apply benzoyl peroxide in the morning and at night. Apply after taking a shower. If you are not taking a shower clean entire shoulder front, back, and side along with the armpit with a clean wet washcloth.     Place a quarter-sized amount of gel on your shoulder and rub in thoroughly, making sure to cover the front, back, and side of your shoulder, along with the armpit.                           Do this twice a day for two days.  (Last application is the night before surgery, AFTER using the CHG soap as described below).   Do NOT apply benzoyl peroxide gel on the day of  surgery.   2 days before ____ AM   ____ PM              1 day before ____ AM   ____ PM    2) CHG Soap: Please do not use if you have an allergy to CHG or antibacterial soaps. If your skin becomes reddened/irritated stop using the CHG.  Do not shave (including legs and underarms) for at least 48 hours prior to first CHG shower. It is OK to shave your face.  Please follow these instructions carefully.   Shower the NIGHT BEFORE SURGERY (before applying benzoyl peroxide gel) and the MORNING OF SURGERY with CHG Soap.   If you chose to wash your hair, wash your hair first as usual with your normal shampoo.  After you shampoo, rinse your hair and body thoroughly to remove the shampoo.  Use CHG as you would any other liquid soap. You can apply CHG directly to the skin and wash gently with a scrungie or a clean washcloth.   Apply the CHG Soap to your body ONLY FROM THE NECK DOWN.  Do not use on open wounds or open sores. Avoid contact with your eyes, ears, mouth and genitals (private parts). Wash Face and genitals (private parts) with your normal soap.   Wash thoroughly, paying special attention to the area where your surgery will be performed.  Thoroughly rinse your body with warm water from the neck down.  DO NOT shower/wash with your normal soap after using and rinsing off the CHG Soap.  Pat yourself dry with a CLEAN TOWEL.  Wear CLEAN PAJAMAS to bed the night before surgery  Place CLEAN SHEETS on your bed the night of your first shower and DO NOT SLEEP WITH PETS.  Oral Hygiene is also important to reduce your risk of infection.  Remember - BRUSH YOUR TEETH THE MORNING OF SURGERY WITH YOUR REGULAR TOOTHPASTE  Day of Surgery: Wear Clean/Comfortable clothing the morning of surgery Do not apply any deodorants/lotions.   Remember to brush your teeth WITH YOUR REGULAR TOOTHPASTE.     If you received a COVID test during your pre-op visit, it is requested that you wear a mask when out  in  public, stay away from anyone that may not be feeling well, and notify your surgeon if you develop symptoms. If you have been in contact with anyone that has tested positive in the last 10 days, please notify your surgeon.    Please read over the following fact sheets that you were given.

## 2022-07-12 NOTE — Telephone Encounter (Signed)
Patient scheduled for left RSA on 07/19/22 and [facility]alling has denied stating he doesn't meet the criteria. Please let me know how you want me to proceed. Order PT vs appeal?  Based on Medicare guidance and [employer] Supplemental guidelines for Shoulder Arthroplasty and  Arthrodesis and the information we have about your condition, you don't meet all the factors. The  factors are: (1) You have one of the following: (a) shoulder arthritis and a rotator cuff (muscles and  tendons that surround the shoulder joint) that no longer functions properly; (b) history of a partial  shoulder replacement that has since failed; (c) history of a total shoulder replacement hat has  since failed with a failed rotator cuff that can't be repaired; (d) large rotator cuff tears with limited  range of motion; (e) you had surgery to repair your shoulder after a tumor was removed; or (f)  shoulder fractures that can't be repaired. (2) You've had pain that interferes with your ability to  complete activities of daily living (such as dressing and bathing) for at least six months. (3) You've  tried medications, muscle strengthening exercises, limiting your activities, and supervised  physical therapy for at least 6 weeks and you haven't improved. (4) Your deltoid (muscle on the  uppermost part of the arm and the top of the shoulder) is intact. (5) Your joint can receive the  implant. And (6) you can flex your shoulder to a 90 degree angle (raise arms forward until straight  out in front of you).

## 2022-07-12 NOTE — Telephone Encounter (Signed)
New prescription with max dose included on the rx sent to pharmacy.

## 2022-07-12 NOTE — Telephone Encounter (Signed)
Peer to peer thx

## 2022-07-12 NOTE — Telephone Encounter (Signed)
Max dose 30 units

## 2022-07-13 ENCOUNTER — Encounter (HOSPITAL_COMMUNITY): Payer: Self-pay

## 2022-07-13 ENCOUNTER — Other Ambulatory Visit: Payer: Self-pay

## 2022-07-13 ENCOUNTER — Encounter (HOSPITAL_COMMUNITY)
Admission: RE | Admit: 2022-07-13 | Discharge: 2022-07-13 | Disposition: A | Discharge status: Home / Self Care | Payer: Medicare HMO | Source: Ambulatory Visit | Attending: Orthopedic Surgery | Admitting: Orthopedic Surgery

## 2022-07-13 VITALS — BP 153/94 | HR 115 | Temp 97.5°F | Resp 17 | Ht 68.0 in | Wt 161.0 lb

## 2022-07-13 DIAGNOSIS — Z01818 Encounter for other preprocedural examination: Secondary | ICD-10-CM

## 2022-07-13 DIAGNOSIS — Z01812 Encounter for preprocedural laboratory examination: Secondary | ICD-10-CM | POA: Diagnosis present

## 2022-07-13 LAB — TYPE AND SCREEN
ABO/RH(D): A POS
Antibody Screen: NEGATIVE

## 2022-07-13 LAB — HEMOGLOBIN A1C
Hgb A1c MFr Bld: 6.9 % — ABNORMAL HIGH (ref 4.8–5.6)
Mean Plasma Glucose: 151.33 mg/dL

## 2022-07-13 LAB — CBC
HCT: 56.3 % — ABNORMAL HIGH (ref 39.0–52.0)
Hemoglobin: 18.4 g/dL — ABNORMAL HIGH (ref 13.0–17.0)
MCH: 30.4 pg (ref 26.0–34.0)
MCHC: 32.7 g/dL (ref 30.0–36.0)
MCV: 92.9 fL (ref 80.0–100.0)
Platelets: 282 10*3/uL (ref 150–400)
RBC: 6.06 MIL/uL — ABNORMAL HIGH (ref 4.22–5.81)
RDW: 13.9 % (ref 11.5–15.5)
WBC: 9.8 10*3/uL (ref 4.0–10.5)
nRBC: 0 % (ref 0.0–0.2)

## 2022-07-13 LAB — URINALYSIS, ROUTINE W REFLEX MICROSCOPIC
Bilirubin Urine: NEGATIVE
Glucose, UA: >=500 mg/dL — AB
Ketones, ur: NEGATIVE mg/dL
Leukocytes,Ua: NEGATIVE
Nitrite: NEGATIVE
Protein, ur: 30 mg/dL — AB
Specific Gravity, Urine: 1.025 (ref 1.005–1.030)
pH: 5 (ref 5.0–8.0)

## 2022-07-13 LAB — BASIC METABOLIC PANEL
Anion gap: 11 (ref 5–15)
BUN: 16 mg/dL (ref 8–23)
CO2: 26 mmol/L (ref 22–32)
Calcium: 9.6 mg/dL (ref 8.9–10.3)
Chloride: 101 mmol/L (ref 98–111)
Creatinine, Ser: 0.88 mg/dL (ref 0.61–1.24)
GFR, Estimated: >60 mL/min (ref >60)
Glucose, Bld: 140 mg/dL — ABNORMAL HIGH (ref 70–99)
Potassium: 5 mmol/L (ref 3.5–5.1)
Sodium: 138 mmol/L (ref 135–145)

## 2022-07-13 LAB — GLUCOSE, CAPILLARY: Glucose-Capillary: 217 mg/dL — ABNORMAL HIGH (ref 70–99)

## 2022-07-13 LAB — SURGICAL PCR SCREEN
MRSA, PCR: POSITIVE — AB
Staphylococcus aureus: POSITIVE — AB

## 2022-07-13 NOTE — Progress Notes (Addendum)
PCP - Mijquel Sagardia Cardiologist - Denies  PPM/ICD - Denies  Chest x-ray - NI EKG - 03/21/22 Stress Test - 11/02/19 ECHO - 04/28/20 Cardiac Cath - Denies  Sleep Study - No OSA  DM _ Type II CBG @ PAT appt 217 last ate at 10am Fasting Blood Sugar - 95-110 Checks Blood Sugar __3___ times a day  Last dose of GLP1 agonist-  Trulicity last dose 02/04/32 GLP1 instructions: Instructed not to take this Friday the 12th  Blood Thinner Instructions:N/A Aspirin Instructions:N/A   ERAS Protcol -Yes PRE-SURGERY G2 given   COVID TEST- NI   Anesthesia review: No  Patient denies shortness of breath, fever, cough and chest pain at PAT appointment   All instructions explained to the patient, with a verbal understanding of the material. Patient agrees to go over the instructions while at home for a better understanding. The opportunity to ask questions was provided.  [facility] staff message to Dr. Marlou Sa regarding abnormal urine results  26 Sent staff message to Dr. Marlou Sa regarding positive MRSA and STAPH surgical swab

## 2022-07-13 NOTE — Telephone Encounter (Signed)
So what are my options his case is pretty clear cut

## 2022-07-13 NOTE — Telephone Encounter (Signed)
Durand is a 67 year old patient who had prior rotator cuff tear repair.  Had a functional shoulder prior to his fall in September 2023.  At that time and since that time he sustained a significant injury to the left shoulder.  MRI scanning does demonstrate retracted rotator cuff tears infraspinatus and supraspinatus with muscle atrophy present.  In my experience patients who have a functioning shoulder and then have an acute tear of their rotator cuff infraspinatus and supraspinatus with his current exam are not able to recover any functional strength or overhead motion with physical therapy.  He did try home exercises that he remembered from his recovery and at the end of the day he does not have the strength to bring his shoulder above 45 or 50 degrees of abduction.  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  Therapy has been uniformly unsuccessful in restoring any type of functional range of motion to patient's in his circumstance.  It would not be a wise use of resources to put him through physical therapy which essentially has 0 chance of improving him into a functional range of motion situation much less with less pain.  Not to mention the mental and physical burden that would place on the patient to have to go through physical therapy which does not have a chance of improving him clinically.  I would be happy to talk with any of your physicians about his case which is really clear-cut in terms of appropriateness for reverse shoulder replacement.  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

## 2022-07-14 LAB — URINE CULTURE

## 2022-07-19 ENCOUNTER — Encounter (HOSPITAL_COMMUNITY): Admission: RE | Payer: Self-pay | Source: Ambulatory Visit

## 2022-07-19 ENCOUNTER — Ambulatory Visit (HOSPITAL_COMMUNITY): Admission: RE | Admit: 2022-07-19 | Payer: Medicare HMO | Source: Ambulatory Visit | Admitting: Orthopedic Surgery

## 2022-07-19 DIAGNOSIS — Z01818 Encounter for other preprocedural examination: Secondary | ICD-10-CM

## 2022-07-19 SURGERY — ARTHROPLASTY, SHOULDER, TOTAL, REVERSE
Anesthesia: General | Site: Shoulder | Laterality: Left

## 2022-07-27 DIAGNOSIS — Z794 Long term (current) use of insulin: Secondary | ICD-10-CM | POA: Diagnosis not present

## 2022-07-27 DIAGNOSIS — F17200 Nicotine dependence, unspecified, uncomplicated: Secondary | ICD-10-CM | POA: Diagnosis not present

## 2022-07-27 DIAGNOSIS — E119 Type 2 diabetes mellitus without complications: Secondary | ICD-10-CM | POA: Diagnosis not present

## 2022-07-27 DIAGNOSIS — Z8249 Family history of ischemic heart disease and other diseases of the circulatory system: Secondary | ICD-10-CM | POA: Diagnosis not present

## 2022-07-27 DIAGNOSIS — Z0100 Encounter for examination of eyes and vision without abnormal findings: Secondary | ICD-10-CM | POA: Diagnosis not present

## 2022-07-27 DIAGNOSIS — K219 Gastro-esophageal reflux disease without esophagitis: Secondary | ICD-10-CM | POA: Diagnosis not present

## 2022-07-27 DIAGNOSIS — Z833 Family history of diabetes mellitus: Secondary | ICD-10-CM | POA: Diagnosis not present

## 2022-07-27 DIAGNOSIS — Z7984 Long term (current) use of oral hypoglycemic drugs: Secondary | ICD-10-CM | POA: Diagnosis not present

## 2022-07-27 DIAGNOSIS — Z7985 Long-term (current) use of injectable non-insulin antidiabetic drugs: Secondary | ICD-10-CM | POA: Diagnosis not present

## 2022-07-27 DIAGNOSIS — E1165 Type 2 diabetes mellitus with hyperglycemia: Secondary | ICD-10-CM | POA: Diagnosis not present

## 2022-07-27 DIAGNOSIS — Z803 Family history of malignant neoplasm of breast: Secondary | ICD-10-CM | POA: Diagnosis not present

## 2022-07-27 DIAGNOSIS — Z008 Encounter for other general examination: Secondary | ICD-10-CM | POA: Diagnosis not present

## 2022-07-27 DIAGNOSIS — E785 Hyperlipidemia, unspecified: Secondary | ICD-10-CM | POA: Diagnosis not present

## 2022-07-27 DIAGNOSIS — H2513 Age-related nuclear cataract, bilateral: Secondary | ICD-10-CM | POA: Diagnosis not present

## 2022-07-27 DIAGNOSIS — I209 Angina pectoris, unspecified: Secondary | ICD-10-CM | POA: Diagnosis not present

## 2022-07-27 DIAGNOSIS — I10 Essential (primary) hypertension: Secondary | ICD-10-CM | POA: Diagnosis not present

## 2022-07-27 LAB — HM DIABETES EYE EXAM

## 2022-08-01 ENCOUNTER — Other Ambulatory Visit: Payer: Self-pay | Admitting: Emergency Medicine

## 2022-08-01 DIAGNOSIS — E1165 Type 2 diabetes mellitus with hyperglycemia: Secondary | ICD-10-CM

## 2022-08-03 ENCOUNTER — Encounter: Payer: Medicare HMO | Admitting: Surgical

## 2022-08-03 ENCOUNTER — Ambulatory Visit: Payer: Medicare HMO | Admitting: Emergency Medicine

## 2022-08-05 ENCOUNTER — Other Ambulatory Visit: Payer: Self-pay

## 2022-08-11 ENCOUNTER — Telehealth: Payer: Self-pay

## 2022-08-11 NOTE — Telephone Encounter (Signed)
Please contact patient to schedule OV with PCP and AWV-S with [facility] the same day with a Administrator, sports.

## 2022-08-24 NOTE — Progress Notes (Signed)
Surgical Instructions    Your procedure is scheduled on Tuesday, August 30, 2022.  Report to [facility] Main Entrance "A" at 8:30 A.M., then check in with the Admitting office.  Call this number if you have problems the morning of surgery:  [phone]   If you have any questions prior to your surgery date call [phone]: Open Monday-Friday 8am-4pm If you experience any cold or flu symptoms such as cough, fever, chills, shortness of breath, etc. between now and your scheduled surgery, please notify us at the above number     Remember:  Do not eat after midnight the night before your surgery  You may drink clear liquids until 7:30 the morning of your surgery.   Clear liquids allowed are: Water, Non-Citrus Juices (without pulp), Carbonated Beverages, Clear Tea, Black Coffee ONLY (NO MILK, CREAM OR POWDERED CREAMER of any kind), and Gatorade    Take these medicines the morning of surgery with A SIP OF WATER:  amLODipine (NORVASC)  atorvastatin (LIPITOR)  pantoprazole (PROTONIX)  Ubrogepant (UBRELVY )  If needed:  albuterol (VENTOLIN HFA) Please bring with you the day of surgery.  (BREZTRI AEROSPHERE Inhaler Please bring with you the day of surgery.  Do not take Farxiga 7 days prior to Surgery. Last dose on 08/23/2022  Do not take Trulicity 7 days prior to surgery. Last dose on 08/18/2022  WHAT DO I DO ABOUT MY DIABETES MEDICATION?   Do not take oral diabetes medicines (pills) the morning of surgery.  The day of surgery, do not take other diabetes injectables, including Insulin Novolog or Trulicity (dulaglutide).  If your CBG is greater than 220 mg/dL, you may take  of your sliding scale (correction) dose of insulin. Take 1 unit of Insulin Novolog.   HOW TO MANAGE YOUR DIABETES BEFORE AND AFTER SURGERY  Why is it important to control my blood sugar before and after surgery? Improving blood sugar levels before and after surgery helps healing and can limit problems. A  way of improving blood sugar control is eating a healthy diet by:  Eating less sugar and carbohydrates  Increasing activity/exercise  Talking with your doctor about reaching your blood sugar goals High blood sugars (greater than 180 mg/dL) can raise your risk of infections and slow your recovery, so you will need to focus on controlling your diabetes during the weeks before surgery. Make sure that the doctor who takes care of your diabetes knows about your planned surgery including the date and location.  How do I manage my blood sugar before surgery? Check your blood sugar at least 4 times a day, starting 2 days before surgery, to make sure that the level is not too high or low.  Check your blood sugar the morning of your surgery when you wake up and every 2 hours until you get to the Short Stay unit.  If your blood sugar is less than 70 mg/dL, you will need to treat for low blood sugar: Do not take insulin. Treat a low blood sugar (less than 70 mg/dL) with  cup of clear juice (cranberry or apple), 4 glucose tablets, OR glucose gel. Recheck blood sugar in 15 minutes after treatment (to make sure it is greater than 70 mg/dL). If your blood sugar is not greater than 70 mg/dL on recheck, call [phone] for further instructions. Report your blood sugar to the short stay nurse when you get to Short Stay.  If you are admitted to the hospital after surgery: Your blood sugar will be  checked by the staff and you will probably be given insulin after surgery (instead of oral diabetes medicines) to make sure you have good blood sugar levels. The goal for blood sugar control after surgery is 80-180 mg/dL.   As of today, STOP taking any Aspirin (unless otherwise instructed by your surgeon) Aleve, Naproxen, Ibuprofen, Motrin, Advil, Goody's, BC's, all herbal medications, fish oil, and all vitamins.           [facility]- Preparing For Surgery  Before surgery, you can play an important role. Because  skin is not sterile, your skin needs to be as free of germs as possible. You can reduce the number of germs on your skin by washing with CHG (chlorahexidine gluconate) Soap before surgery.  CHG is an antiseptic cleaner which kills germs and bonds with the skin to continue killing germs even after washing.     Please do not use if you have an allergy to CHG or antibacterial soaps. If your skin becomes reddened/irritated stop using the CHG.  Do not shave (including legs and underarms) for at least 48 hours prior to first CHG shower. It is OK to shave your face.  Please follow these instructions carefully.     Shower the NIGHT BEFORE SURGERY and the MORNING OF SURGERY with CHG Soap.   If you chose to wash your hair, wash your hair first as usual with your normal shampoo. After you shampoo, rinse your hair and body thoroughly to remove the shampoo.  Then [employer] and genitals (private parts) with your normal soap and rinse thoroughly to remove soap.  After that Use CHG Soap as you would any other liquid soap. You can apply CHG directly to the skin and wash gently with a scrungie or a clean washcloth.   Apply the CHG Soap to your body ONLY FROM THE NECK DOWN.  Do not use on open wounds or open sores. Avoid contact with your eyes, ears, mouth and genitals (private parts). Wash Face and genitals (private parts)  with your normal soap.   Wash thoroughly, paying special attention to the area where your surgery will be performed.  Thoroughly rinse your body with warm water from the neck down.  DO NOT shower/wash with your normal soap after using and rinsing off the CHG Soap.  Pat yourself dry with a CLEAN TOWEL.  Wear CLEAN PAJAMAS to bed the night before surgery  Place CLEAN SHEETS on your bed the night before your surgery  DO NOT SLEEP WITH PETS.   Day of Surgery:  Take a shower with CHG soap. Wear Clean/Comfortable clothing the morning of surgery Do not apply any deodorants/lotions.    Remember to brush your teeth WITH YOUR REGULAR TOOTHPASTE.   Do not wear jewelry or makeup. Do not wear lotions, powders, perfumes/cologne or deodorant. Do not shave 48 hours prior to surgery.  Men may shave face and neck. Do not bring valuables to the hospital. Do not wear nail polish, gel polish, artificial nails, or any other type of covering on natural nails (fingers and toes) If you have artificial nails or gel coating that need to be removed by a nail salon, please have this removed prior to surgery. Artificial nails or gel coating may interfere with anesthesia's ability to adequately monitor your vital signs.  [facility] is not responsible for any belongings or valuables.    Do NOT Smoke (Tobacco/Vaping)  24 hours prior to your procedure  If you use a CPAP at night, you  may bring your mask for your overnight stay.   Contacts, glasses, hearing aids, dentures or partials may not be worn into surgery, please bring cases for these belongings   For patients admitted to the hospital, discharge time will be determined by your treatment team.   Patients discharged the day of surgery will not be allowed to drive home, and someone needs to stay with them for 24 hours.  SURGICAL WAITING ROOM VISITATION Patients having surgery or a procedure may have no more than 2 support people in the waiting area - these visitors may rotate.   Children under the age of 64 must have an adult with them who is not the patient. If the patient needs to stay at the hospital during part of their recovery, the visitor guidelines for inpatient rooms apply. Pre-op nurse will coordinate an appropriate time for 1 support person to accompany patient in pre-op.  This support person may not rotate.   Please refer to [email or web address] for the visitor guidelines for Inpatients (after your surgery is over and you are in a regular room).    Special instructions:     Oral Hygiene is also important to reduce your risk of infection.  Remember - BRUSH YOUR TEETH THE MORNING OF SURGERY WITH YOUR REGULAR TOOTHPASTE    If you received a COVID test during your pre-op visit, it is requested that you wear a mask when out in public, stay away from anyone that may not be feeling well, and notify your surgeon if you develop symptoms. If you have been in contact with anyone that has tested positive in the last 10 days, please notify your surgeon.    Please read over the following fact sheets that you were given.

## 2022-08-25 ENCOUNTER — Encounter (HOSPITAL_COMMUNITY)
Admission: RE | Admit: 2022-08-25 | Discharge: 2022-08-25 | Disposition: A | Discharge status: Home / Self Care | Payer: Medicare HMO | Source: Ambulatory Visit | Attending: Orthopedic Surgery | Admitting: Orthopedic Surgery

## 2022-08-25 ENCOUNTER — Encounter (HOSPITAL_COMMUNITY): Payer: Self-pay

## 2022-08-25 ENCOUNTER — Other Ambulatory Visit: Payer: Self-pay

## 2022-08-25 VITALS — BP 149/86 | HR 98 | Temp 98.3°F | Resp 16 | Ht 68.0 in | Wt 156.0 lb

## 2022-08-25 DIAGNOSIS — Z01812 Encounter for preprocedural laboratory examination: Secondary | ICD-10-CM | POA: Insufficient documentation

## 2022-08-25 DIAGNOSIS — Z794 Long term (current) use of insulin: Secondary | ICD-10-CM | POA: Diagnosis not present

## 2022-08-25 DIAGNOSIS — Z01818 Encounter for other preprocedural examination: Secondary | ICD-10-CM

## 2022-08-25 DIAGNOSIS — E119 Type 2 diabetes mellitus without complications: Secondary | ICD-10-CM | POA: Diagnosis not present

## 2022-08-25 LAB — HEMOGLOBIN A1C
Hgb A1c MFr Bld: 7.1 % — ABNORMAL HIGH (ref 4.8–5.6)
Mean Plasma Glucose: 157.07 mg/dL

## 2022-08-25 LAB — CBC
HCT: 55 % — ABNORMAL HIGH (ref 39.0–52.0)
Hemoglobin: 18.9 g/dL — ABNORMAL HIGH (ref 13.0–17.0)
MCH: 31.7 pg (ref 26.0–34.0)
MCHC: 34.4 g/dL (ref 30.0–36.0)
MCV: 92.1 fL (ref 80.0–100.0)
Platelets: 202 10*3/uL (ref 150–400)
RBC: 5.97 MIL/uL — ABNORMAL HIGH (ref 4.22–5.81)
RDW: 13.2 % (ref 11.5–15.5)
WBC: 7.3 10*3/uL (ref 4.0–10.5)
nRBC: 0 % (ref 0.0–0.2)

## 2022-08-25 LAB — BASIC METABOLIC PANEL
Anion gap: 9 (ref 5–15)
BUN: 11 mg/dL (ref 8–23)
CO2: 26 mmol/L (ref 22–32)
Calcium: 9.8 mg/dL (ref 8.9–10.3)
Chloride: 101 mmol/L (ref 98–111)
Creatinine, Ser: 0.87 mg/dL (ref 0.61–1.24)
GFR, Estimated: >60 mL/min (ref >60)
Glucose, Bld: 240 mg/dL — ABNORMAL HIGH (ref 70–99)
Potassium: 4.4 mmol/L (ref 3.5–5.1)
Sodium: 136 mmol/L (ref 135–145)

## 2022-08-25 LAB — SURGICAL PCR SCREEN
MRSA, PCR: NEGATIVE
Staphylococcus aureus: POSITIVE — AB

## 2022-08-25 LAB — GLUCOSE, CAPILLARY: Glucose-Capillary: 323 mg/dL — ABNORMAL HIGH (ref 70–99)

## 2022-08-25 NOTE — Progress Notes (Addendum)
Surgical Instructions    Your procedure is scheduled on Tuesday, August 30, 2022.  Report to [facility] Main Entrance "A" at 8:30 A.M., then check in with the Admitting office.  Call this number if you have problems the morning of surgery:  [phone]   If you have any questions prior to your surgery date call [phone]: Open Monday-Friday 8am-4pm If you experience any cold or flu symptoms such as cough, fever, chills, shortness of breath, etc. between now and your scheduled surgery, please notify us at the above number     Remember:  Do not eat after midnight the night before your surgery  You may drink clear liquids until 7:30 the morning of your surgery.   Clear liquids allowed are: Water, Non-Citrus Juices (without pulp), Carbonated Beverages, Clear Tea, Black Coffee ONLY (NO MILK, CREAM OR POWDERED CREAMER of any kind), and Gatorade    Take these medicines the morning of surgery with A SIP OF WATER:  amLODipine (NORVASC)  atorvastatin (LIPITOR)  pantoprazole (PROTONIX)  Ubrogepant (UBRELVY )  If needed:  albuterol (VENTOLIN HFA) Please bring with you the day of surgery.  (BREZTRI AEROSPHERE Inhaler Please bring with you the day of surgery.  Do not take Farxiga 7 days prior to Surgery. Last dose on 08/23/2022  Do not take Trulicity 7 days prior to surgery. Last dose on 08/18/2022  WHAT DO I DO ABOUT MY DIABETES MEDICATION?   Do not take oral diabetes medicines (pills) the morning of surgery. Do NOT take glipiZIDE (GLUCOTROL) the morning of surgery.  The day of surgery, do not take other diabetes injectables, including Insulin Novolog or Trulicity (dulaglutide).  If your CBG is greater than 220 mg/dL, you may take  of your sliding scale (correction) dose of insulin. Take 15 units of Insulin Novolog.   HOW TO MANAGE YOUR DIABETES BEFORE AND AFTER SURGERY  Why is it important to control my blood sugar before and after surgery? Improving blood sugar levels before  and after surgery helps healing and can limit problems. A way of improving blood sugar control is eating a healthy diet by:  Eating less sugar and carbohydrates  Increasing activity/exercise  Talking with your doctor about reaching your blood sugar goals High blood sugars (greater than 180 mg/dL) can raise your risk of infections and slow your recovery, so you will need to focus on controlling your diabetes during the weeks before surgery. Make sure that the doctor who takes care of your diabetes knows about your planned surgery including the date and location.  How do I manage my blood sugar before surgery? Check your blood sugar at least 4 times a day, starting 2 days before surgery, to make sure that the level is not too high or low.  Check your blood sugar the morning of your surgery when you wake up and every 2 hours until you get to the Short Stay unit.  If your blood sugar is less than 70 mg/dL, you will need to treat for low blood sugar: Do not take insulin. Treat a low blood sugar (less than 70 mg/dL) with  cup of clear juice (cranberry or apple), 4 glucose tablets, OR glucose gel. Recheck blood sugar in 15 minutes after treatment (to make sure it is greater than 70 mg/dL). If your blood sugar is not greater than 70 mg/dL on recheck, call [phone] for further instructions. Report your blood sugar to the short stay nurse when you get to Short Stay.  If you are admitted to  the hospital after surgery: Your blood sugar will be checked by the staff and you will probably be given insulin after surgery (instead of oral diabetes medicines) to make sure you have good blood sugar levels. The goal for blood sugar control after surgery is 80-180 mg/dL.   As of today, STOP taking any Aspirin (unless otherwise instructed by your surgeon) Aleve, Naproxen, Ibuprofen, Motrin, Advil, Goody's, BC's, all herbal medications, fish oil, and all vitamins.           [facility]- Preparing For  Surgery    Oral Hygiene is also important to reduce your risk of infection.  Remember - BRUSH YOUR TEETH THE MORNING OF SURGERY WITH YOUR REGULAR TOOTHPASTE  [facility]- Preparing for Total Shoulder Arthroplasty  Before surgery, you can play an important role. Because skin is not sterile, your skin needs to be as free of germs as possible. You can reduce the number of germs on your skin by using the following products.   Benzoyl Peroxide Gel  o Reduces the number of germs present on the skin  o Applied twice a day to shoulder area starting two days before surgery   Chlorhexidine Gluconate (CHG) Soap (instructions listed above on how to wash with CHG Soap)  o An antiseptic cleaner that kills germs and bonds with the skin to continue killing germs even after washing  o Used for showering the night before surgery and morning of surgery   ==================================================================  Please follow these instructions carefully:  BENZOYL PEROXIDE 5% GEL  Please do not use if you have an allergy to benzoyl peroxide. If your skin becomes reddened/irritated stop using the benzoyl peroxide.  Starting two days before surgery, apply as follows:  1. Apply benzoyl peroxide in the morning and at night. Apply after taking a shower. If you are not taking a shower clean entire shoulder front, back, and side along with the armpit with a clean wet washcloth.  2. Place a quarter-sized dollop on your SHOULDER and rub in thoroughly, making sure to cover the front, back, and side of your shoulder, along with the armpit.   2 Days prior to Surgery First Dose on 08/28/2022 Morning Second Dose on 08/28/2022 Night  Day Before Surgery First Dose on 08/29/2022 Morning Night before surgery wash (entire body except face and private areas) with CHG Soap THEN Second Dose on 08/29/2022 Night   Morning of Surgery, 08/30/2022  wash BODY AGAIN with CHG Soap   4. Do NOT apply benzoyl  peroxide gel on the day of surgery   [facility]- Preparing For Surgery  Before surgery, you can play an important role. Because skin is not sterile, your skin needs to be as free of germs as possible. You can reduce the number of germs on your skin by washing with CHG (chlorahexidine gluconate) Soap before surgery.  CHG is an antiseptic cleaner which kills germs and bonds with the skin to continue killing germs even after washing.     Please do not use if you have an allergy to CHG or antibacterial soaps. If your skin becomes reddened/irritated stop using the CHG.  Do not shave (including legs and underarms) for at least 48 hours prior to first CHG shower. It is OK to shave your face.  Please follow these instructions carefully.     Shower the NIGHT BEFORE SURGERY and the MORNING OF SURGERY with CHG Soap.   If you chose to wash your hair, wash your hair first as usual with your  normal shampoo. After you shampoo, rinse your hair and body thoroughly to remove the shampoo.  Then [employer] and genitals (private parts) with your normal soap and rinse thoroughly to remove soap.  After that Use CHG Soap as you would any other liquid soap. You can apply CHG directly to the skin and wash gently with a scrungie or a clean washcloth.   Apply the CHG Soap to your body ONLY FROM THE NECK DOWN.  Do not use on open wounds or open sores. Avoid contact with your eyes, ears, mouth and genitals (private parts). Wash Face and genitals (private parts)  with your normal soap.   Wash thoroughly, paying special attention to the area where your surgery will be performed.  Thoroughly rinse your body with warm water from the neck down.  DO NOT shower/wash with your normal soap after using and rinsing off the CHG Soap.  Pat yourself dry with a CLEAN TOWEL.  8. Apply the Benzoyl Peroxide only the night before surgery.  Do Not use it the morning of surgery.  Wear CLEAN PAJAMAS to bed the night before  surgery  Place CLEAN SHEETS on your bed the night before your surgery  DO NOT SLEEP WITH PETS.   Day of Surgery: Take a shower with CHG soap. Wear Clean/Comfortable clothing the morning of surgery Do not apply any deodorants/lotions.   Remember to brush your teeth WITH YOUR REGULAR TOOTHPASTE.   Please read over the following fact sheets that you were given.   [facility] is not responsible for any belongings or valuables.    Do NOT Smoke (Tobacco/Vaping)  24 hours prior to your procedure  If you use a CPAP at night, you may bring your mask for your overnight stay.   Contacts, glasses, hearing aids, dentures or partials may not be worn into surgery, please bring cases for these belongings   For patients admitted to the hospital, discharge time will be determined by your treatment team.   Patients discharged the day of surgery will not be allowed to drive home, and someone needs to stay with them for 24 hours.  SURGICAL WAITING ROOM VISITATION Patients having surgery or a procedure may have no more than 2 support people in the waiting area - these visitors may rotate.   Children under the age of 57 must have an adult with them who is not the patient. If the patient needs to stay at the hospital during part of their recovery, the visitor guidelines for inpatient rooms apply. Pre-op nurse will coordinate an appropriate time for 1 support person to accompany patient in pre-op.  This support person may not rotate.   Please refer to [email or web address] for the visitor guidelines for Inpatients (after your surgery is over and you are in a regular room).    Special instructions:    Oral Hygiene is also important to reduce your risk of infection.  Remember - BRUSH YOUR TEETH THE MORNING OF SURGERY WITH YOUR REGULAR TOOTHPASTE    If you received a COVID test during your pre-op visit, it is requested that you wear a mask when out in  public, stay away from anyone that may not be feeling well, and notify your surgeon if you develop symptoms. If you have been in contact with anyone that has tested positive in the last 10 days, please notify your surgeon.    Please read over the following fact sheets that you were given.

## 2022-08-25 NOTE — Progress Notes (Signed)
PCP - Dr. Ines Bloomer [place] Cardiologist - Denies   PPM/ICD - Denies   Chest x-ray - 03/18/2022 EKG - 03/21/2022 Stress Test - 11/02/2019 ECHO - 04/28/2020 Cardiac Cath - Denies  Sleep Study - Denies CPAP - Denies  Fasting Blood Sugar - 100-110 Checks Blood Sugar _3____ times a day  Last dose of GLP1 agonist-  [place] and Trulicity.(08/18/2022) GLP1 instructions: Stop 7 days prior to surgery  Blood Thinner Instructions:Denies Aspirin Instructions:Follow surgeons instructions  ERAS Protcol - Yes, clear liquids up to 3 hours prior to surgery PRE-SURGERY Ensure or G2- No  COVID TEST- Denies   Anesthesia review: Diabetic with COPD/Emphysema.  Spoke with Karoline Caldwell, PA-C BS = [phone] at PAT.  Patient ate [place] food with sugar and carbs 25 min prior to arrival. Has taken his insulin and other diabetic meds for the day.   Patient denies shortness of breath, fever, cough and chest pain at PAT appointment   All instructions explained to the patient, with a verbal understanding of the material. Patient agrees to go over the instructions while at home for a better understanding. Patient also instructed to self quarantine after being tested for COVID-19. The opportunity to ask questions was provided.

## 2022-08-29 ENCOUNTER — Telehealth: Payer: Self-pay | Admitting: *Deleted

## 2022-08-29 NOTE — Care Plan (Signed)
[facility] RNCM call to patient to speak with him prior to his Left Reverse shoulder arthroplasty with Dr. Marlou Sa scheduled on 08/30/22. He is an Ortho bundle patient through [facility] and RNCM spoke with his daughter, who assists in interpretation for him. She is agreeable to case management. He will have assistance at home after discharge. He has been provided a shoulder brace that provides ROM by DME company prior to surgery. Reviewed some post op instructions with patient's daughter. Will continue to follow for needs. SANE (Single assessment numerical evaluation) = 50%. Will follow up with patient as well for this assessment.

## 2022-08-29 NOTE — Telephone Encounter (Signed)
Ortho bundle Pre-op call completed. 

## 2022-08-29 NOTE — Anesthesia Preprocedure Evaluation (Signed)
Anesthesia Evaluation  Patient identified by MRN, date of birth, ID band Patient awake    Reviewed: Allergy & Precautions, NPO status , Patient's Chart, lab work & pertinent test results  History of Anesthesia Complications Negative for: history of anesthetic complications  Airway Mallampati: II  TM Distance: >3 FB Neck ROM: Full    Dental  (+) Edentulous Lower, Edentulous Upper   Pulmonary COPD (O2 PRN at home),  COPD inhaler and oxygen dependent, Current Smoker and Patient abstained from smoking.   Pulmonary exam normal        Cardiovascular hypertension, Pt. on medications + CAD  Normal cardiovascular exam     Neuro/Psych  Headaches  negative psych ROS   GI/Hepatic Neg liver ROS,GERD  Medicated,,  Endo/Other  diabetes (A1c 7.1), Type 2, Oral Hypoglycemic Agents, Insulin Dependent    Renal/GU negative Renal ROS  negative genitourinary   Musculoskeletal negative musculoskeletal ROS (+)    Abdominal   Peds  Hematology negative hematology ROS (+)   Anesthesia Other Findings Day of surgery medications reviewed with patient.  Reproductive/Obstetrics negative OB ROS                             Anesthesia Physical Anesthesia Plan  ASA: 2  Anesthesia Plan: General   Post-op Pain Management: Regional block* and Tylenol PO (pre-op)*   Induction: Intravenous  PONV Risk Score and Plan: 1 and Ondansetron, Dexamethasone, Treatment may vary due to age or medical condition and Midazolam  Airway Management Planned: Oral ETT  Additional Equipment: None  Intra-op Plan:   Post-operative Plan: Extubation in OR  Informed Consent: I have reviewed the patients History and Physical, chart, labs and discussed the procedure including the risks, benefits and alternatives for the proposed anesthesia with the patient or authorized representative who has indicated his/her understanding and acceptance.      Dental advisory given and Interpreter used for interveiw  Plan Discussed with: CRNA  Anesthesia Plan Comments:        Anesthesia Quick Evaluation

## 2022-08-30 ENCOUNTER — Encounter (HOSPITAL_COMMUNITY)
Admission: RE | Disposition: A | Discharge status: Home / Self Care | Payer: Self-pay | Source: Home / Self Care | Attending: Orthopedic Surgery

## 2022-08-30 ENCOUNTER — Encounter (HOSPITAL_COMMUNITY): Payer: Self-pay | Admitting: Orthopedic Surgery

## 2022-08-30 ENCOUNTER — Ambulatory Visit (HOSPITAL_COMMUNITY): Payer: Medicare HMO | Admitting: Physician Assistant

## 2022-08-30 ENCOUNTER — Observation Stay (HOSPITAL_COMMUNITY): Payer: Medicare HMO

## 2022-08-30 ENCOUNTER — Observation Stay (HOSPITAL_COMMUNITY)
Admission: RE | Admit: 2022-08-30 | Discharge: 2022-08-31 | Disposition: A | Discharge status: Home / Self Care | Payer: Medicare HMO | Attending: Orthopedic Surgery | Admitting: Orthopedic Surgery

## 2022-08-30 ENCOUNTER — Ambulatory Visit (HOSPITAL_BASED_OUTPATIENT_CLINIC_OR_DEPARTMENT_OTHER): Payer: Medicare HMO | Admitting: Anesthesiology

## 2022-08-30 ENCOUNTER — Other Ambulatory Visit: Payer: Self-pay

## 2022-08-30 DIAGNOSIS — Z794 Long term (current) use of insulin: Secondary | ICD-10-CM | POA: Diagnosis not present

## 2022-08-30 DIAGNOSIS — M7522 Bicipital tendinitis, left shoulder: Secondary | ICD-10-CM

## 2022-08-30 DIAGNOSIS — F17210 Nicotine dependence, cigarettes, uncomplicated: Secondary | ICD-10-CM | POA: Insufficient documentation

## 2022-08-30 DIAGNOSIS — E119 Type 2 diabetes mellitus without complications: Secondary | ICD-10-CM | POA: Diagnosis not present

## 2022-08-30 DIAGNOSIS — Z96612 Presence of left artificial shoulder joint: Secondary | ICD-10-CM | POA: Diagnosis not present

## 2022-08-30 DIAGNOSIS — J449 Chronic obstructive pulmonary disease, unspecified: Secondary | ICD-10-CM | POA: Insufficient documentation

## 2022-08-30 DIAGNOSIS — Z969 Presence of functional implant, unspecified: Secondary | ICD-10-CM

## 2022-08-30 DIAGNOSIS — Z471 Aftercare following joint replacement surgery: Secondary | ICD-10-CM | POA: Diagnosis not present

## 2022-08-30 DIAGNOSIS — M12812 Other specific arthropathies, not elsewhere classified, left shoulder: Secondary | ICD-10-CM | POA: Diagnosis not present

## 2022-08-30 DIAGNOSIS — M75102 Unspecified rotator cuff tear or rupture of left shoulder, not specified as traumatic: Secondary | ICD-10-CM | POA: Diagnosis present

## 2022-08-30 DIAGNOSIS — G8918 Other acute postprocedural pain: Secondary | ICD-10-CM | POA: Diagnosis not present

## 2022-08-30 DIAGNOSIS — Z79899 Other long term (current) drug therapy: Secondary | ICD-10-CM | POA: Insufficient documentation

## 2022-08-30 DIAGNOSIS — R69 Illness, unspecified: Secondary | ICD-10-CM | POA: Diagnosis not present

## 2022-08-30 DIAGNOSIS — M7989 Other specified soft tissue disorders: Secondary | ICD-10-CM | POA: Diagnosis not present

## 2022-08-30 HISTORY — PX: REVERSE SHOULDER ARTHROPLASTY: SHX5054

## 2022-08-30 LAB — GLUCOSE, CAPILLARY
Glucose-Capillary: 205 mg/dL — ABNORMAL HIGH (ref 70–99)
Glucose-Capillary: 92 mg/dL (ref 70–99)
Glucose-Capillary: 141 mg/dL — ABNORMAL HIGH (ref 70–99)
Glucose-Capillary: 158 mg/dL — ABNORMAL HIGH (ref 70–99)
Glucose-Capillary: 232 mg/dL — ABNORMAL HIGH (ref 70–99)

## 2022-08-30 SURGERY — ARTHROPLASTY, SHOULDER, TOTAL, REVERSE
Anesthesia: General | Site: Shoulder | Laterality: Left

## 2022-08-30 MED ORDER — HYDROMORPHONE HCL 1 MG/ML IJ SOLN
0.5000 mg | INTRAMUSCULAR | Status: DC | PRN
  Administered 2022-08-30: 0.5 mg via INTRAVENOUS

## 2022-08-30 MED ORDER — INSULIN ASPART 100 UNIT/ML IJ SOLN
4.0000 [IU] | Freq: Three times a day (TID) | INTRAMUSCULAR | Status: DC
  Administered 2022-08-30 – 2022-08-31 (×3): 4 [IU] via SUBCUTANEOUS

## 2022-08-30 MED ORDER — METOCLOPRAMIDE HCL 5 MG PO TABS: 5.0000 mg | ORAL_TABLET | Freq: Three times a day (TID) | ORAL | Status: DC | PRN

## 2022-08-30 MED ORDER — MIDAZOLAM HCL 2 MG/2ML IJ SOLN
INTRAMUSCULAR | Status: AC
  Administered 2022-08-30: 2 mg via INTRAVENOUS
  Filled 2022-08-30: qty 2

## 2022-08-30 MED ORDER — INSULIN ASPART 100 UNIT/ML IJ SOLN
INTRAMUSCULAR | Status: AC
  Filled 2022-08-30: qty 1

## 2022-08-30 MED ORDER — OXYCODONE HCL 5 MG PO TABS
ORAL_TABLET | ORAL | Status: AC
  Filled 2022-08-30: qty 1

## 2022-08-30 MED ORDER — ONDANSETRON HCL 4 MG/2ML IJ SOLN
INTRAMUSCULAR | Status: AC
  Filled 2022-08-30: qty 4

## 2022-08-30 MED ORDER — DAPAGLIFLOZIN PROPANEDIOL 10 MG PO TABS
10.0000 mg | ORAL_TABLET | Freq: Every day | ORAL | Status: DC
  Administered 2022-08-31: 10 mg via ORAL
  Filled 2022-08-30 (×2): qty 1

## 2022-08-30 MED ORDER — EPHEDRINE SULFATE-NACL 50-0.9 MG/10ML-% IV SOSY
PREFILLED_SYRINGE | INTRAVENOUS | Status: DC | PRN
  Administered 2022-08-30: 10 mg via INTRAVENOUS

## 2022-08-30 MED ORDER — 0.9 % SODIUM CHLORIDE (POUR BTL) OPTIME
TOPICAL | Status: DC | PRN
  Administered 2022-08-30: 4000 mL

## 2022-08-30 MED ORDER — TRANEXAMIC ACID-NACL 1000-0.7 MG/100ML-% IV SOLN
INTRAVENOUS | Status: AC
  Filled 2022-08-30: qty 100

## 2022-08-30 MED ORDER — ALBUTEROL SULFATE HFA 108 (90 BASE) MCG/ACT IN AERS
INHALATION_SPRAY | RESPIRATORY_TRACT | Status: DC | PRN
  Administered 2022-08-30: 8 via RESPIRATORY_TRACT

## 2022-08-30 MED ORDER — LACTATED RINGERS IV SOLN: INTRAVENOUS | Status: DC

## 2022-08-30 MED ORDER — PHENYLEPHRINE 80 MCG/ML (10ML) SYRINGE FOR IV PUSH (FOR BLOOD PRESSURE SUPPORT)
PREFILLED_SYRINGE | INTRAVENOUS | Status: DC | PRN
  Administered 2022-08-30: 80 ug via INTRAVENOUS
  Administered 2022-08-30: 160 ug via INTRAVENOUS
  Administered 2022-08-30 (×2): 80 ug via INTRAVENOUS

## 2022-08-30 MED ORDER — INSULIN ASPART 100 UNIT/ML IJ SOLN
0.0000 [IU] | INTRAMUSCULAR | Status: DC | PRN
  Administered 2022-08-30: 4 [IU] via SUBCUTANEOUS

## 2022-08-30 MED ORDER — DOCUSATE SODIUM 100 MG PO CAPS
100.0000 mg | ORAL_CAPSULE | Freq: Two times a day (BID) | ORAL | Status: DC
  Administered 2022-08-30 – 2022-08-31 (×2): 100 mg via ORAL
  Filled 2022-08-30 (×2): qty 1

## 2022-08-30 MED ORDER — FENTANYL CITRATE (PF) 100 MCG/2ML IJ SOLN
INTRAMUSCULAR | Status: AC
  Filled 2022-08-30: qty 2

## 2022-08-30 MED ORDER — SODIUM CHLORIDE 0.9 % IV SOLN: INTRAVENOUS | Status: DC

## 2022-08-30 MED ORDER — TRANEXAMIC ACID-NACL 1000-0.7 MG/100ML-% IV SOLN
INTRAVENOUS | Status: DC | PRN
  Administered 2022-08-30: 1000 mg via INTRAVENOUS

## 2022-08-30 MED ORDER — ASPIRIN 81 MG PO TBEC
81.0000 mg | DELAYED_RELEASE_TABLET | Freq: Every day | ORAL | Status: DC
  Administered 2022-08-30 – 2022-08-31 (×2): 81 mg via ORAL
  Filled 2022-08-30 (×2): qty 1

## 2022-08-30 MED ORDER — SUGAMMADEX SODIUM 200 MG/2ML IV SOLN
INTRAVENOUS | Status: DC | PRN
  Administered 2022-08-30: 200 mg via INTRAVENOUS

## 2022-08-30 MED ORDER — CEFAZOLIN SODIUM-DEXTROSE 2-4 GM/100ML-% IV SOLN
2.0000 g | Freq: Three times a day (TID) | INTRAVENOUS | Status: DC
  Administered 2022-08-30 – 2022-08-31 (×2): 2 g via INTRAVENOUS
  Filled 2022-08-30 (×2): qty 100

## 2022-08-30 MED ORDER — BUPIVACAINE LIPOSOME 1.3 % IJ SUSP
INTRAMUSCULAR | Status: AC
  Filled 2022-08-30: qty 10

## 2022-08-30 MED ORDER — LIDOCAINE 2% (20 MG/ML) 5 ML SYRINGE
INTRAMUSCULAR | Status: AC
  Filled 2022-08-30: qty 5

## 2022-08-30 MED ORDER — ONDANSETRON HCL 4 MG/2ML IJ SOLN: 4.0000 mg | Freq: Four times a day (QID) | INTRAMUSCULAR | Status: DC | PRN

## 2022-08-30 MED ORDER — MENTHOL 3 MG MT LOZG: 1.0000 | LOZENGE | OROMUCOSAL | Status: DC | PRN

## 2022-08-30 MED ORDER — ROCURONIUM BROMIDE 10 MG/ML (PF) SYRINGE
PREFILLED_SYRINGE | INTRAVENOUS | Status: AC
  Filled 2022-08-30: qty 10

## 2022-08-30 MED ORDER — VANCOMYCIN HCL 1000 MG IV SOLR
INTRAVENOUS | Status: AC
  Filled 2022-08-30: qty 20

## 2022-08-30 MED ORDER — ONDANSETRON HCL 4 MG/2ML IJ SOLN
INTRAMUSCULAR | Status: DC | PRN
  Administered 2022-08-30: 4 mg via INTRAVENOUS

## 2022-08-30 MED ORDER — ORAL CARE MOUTH RINSE: 15.0000 mL | Freq: Once | OROMUCOSAL | Status: AC

## 2022-08-30 MED ORDER — ONDANSETRON HCL 4 MG PO TABS: 4.0000 mg | ORAL_TABLET | Freq: Four times a day (QID) | ORAL | Status: DC | PRN

## 2022-08-30 MED ORDER — PANTOPRAZOLE SODIUM 40 MG PO TBEC
40.0000 mg | DELAYED_RELEASE_TABLET | Freq: Every day | ORAL | Status: DC
  Administered 2022-08-30 – 2022-08-31 (×2): 40 mg via ORAL
  Filled 2022-08-30 (×2): qty 1

## 2022-08-30 MED ORDER — METOCLOPRAMIDE HCL 5 MG/ML IJ SOLN: 5.0000 mg | Freq: Three times a day (TID) | INTRAMUSCULAR | Status: DC | PRN

## 2022-08-30 MED ORDER — AMLODIPINE BESYLATE 5 MG PO TABS
5.0000 mg | ORAL_TABLET | Freq: Every day | ORAL | Status: DC
  Administered 2022-08-30 – 2022-08-31 (×2): 5 mg via ORAL
  Filled 2022-08-30 (×2): qty 1

## 2022-08-30 MED ORDER — ALBUTEROL SULFATE HFA 108 (90 BASE) MCG/ACT IN AERS
INHALATION_SPRAY | RESPIRATORY_TRACT | Status: AC
  Filled 2022-08-30: qty 6.7

## 2022-08-30 MED ORDER — ALBUTEROL SULFATE (2.5 MG/3ML) 0.083% IN NEBU: 3.0000 mL | INHALATION_SOLUTION | Freq: Four times a day (QID) | RESPIRATORY_TRACT | Status: DC | PRN

## 2022-08-30 MED ORDER — ACETAMINOPHEN 500 MG PO TABS
1000.0000 mg | ORAL_TABLET | Freq: Four times a day (QID) | ORAL | Status: AC
  Administered 2022-08-30 – 2022-08-31 (×4): 1000 mg via ORAL
  Filled 2022-08-30 (×4): qty 2

## 2022-08-30 MED ORDER — FENTANYL CITRATE (PF) 250 MCG/5ML IJ SOLN
INTRAMUSCULAR | Status: AC
  Filled 2022-08-30: qty 5

## 2022-08-30 MED ORDER — PROPOFOL 10 MG/ML IV BOLUS
INTRAVENOUS | Status: DC | PRN
  Administered 2022-08-30: 150 mg via INTRAVENOUS

## 2022-08-30 MED ORDER — VANCOMYCIN HCL 1000 MG IV SOLR
INTRAVENOUS | Status: DC | PRN
  Administered 2022-08-30: 1000 mg

## 2022-08-30 MED ORDER — MIDAZOLAM HCL 2 MG/2ML IJ SOLN: 2.0000 mg | Freq: Once | INTRAMUSCULAR | Status: AC

## 2022-08-30 MED ORDER — DEXAMETHASONE SODIUM PHOSPHATE 10 MG/ML IJ SOLN
INTRAMUSCULAR | Status: DC | PRN
  Administered 2022-08-30: 5 mg via INTRAVENOUS

## 2022-08-30 MED ORDER — METHOCARBAMOL 1000 MG/10ML IJ SOLN
500.0000 mg | Freq: Four times a day (QID) | INTRAVENOUS | Status: DC | PRN
  Administered 2022-08-30: 500 mg via INTRAVENOUS
  Filled 2022-08-30: qty 500

## 2022-08-30 MED ORDER — FENTANYL CITRATE (PF) 100 MCG/2ML IJ SOLN: 50.0000 ug | Freq: Once | INTRAMUSCULAR | Status: AC

## 2022-08-30 MED ORDER — ROCURONIUM BROMIDE 10 MG/ML (PF) SYRINGE
PREFILLED_SYRINGE | INTRAVENOUS | Status: DC | PRN
  Administered 2022-08-30: 60 mg via INTRAVENOUS
  Administered 2022-08-30: 20 mg via INTRAVENOUS

## 2022-08-30 MED ORDER — ACETAMINOPHEN 325 MG PO TABS: 325.0000 mg | ORAL_TABLET | Freq: Four times a day (QID) | ORAL | Status: DC | PRN

## 2022-08-30 MED ORDER — AMISULPRIDE (ANTIEMETIC) 5 MG/2ML IV SOLN: 10.0000 mg | Freq: Once | INTRAVENOUS | Status: DC | PRN

## 2022-08-30 MED ORDER — FENTANYL CITRATE (PF) 250 MCG/5ML IJ SOLN
INTRAMUSCULAR | Status: DC | PRN
  Administered 2022-08-30: 100 ug via INTRAVENOUS
  Administered 2022-08-30: 50 ug via INTRAVENOUS

## 2022-08-30 MED ORDER — CHLORHEXIDINE GLUCONATE 0.12 % MT SOLN
15.0000 mL | Freq: Once | OROMUCOSAL | Status: AC
  Administered 2022-08-30: 15 mL via OROMUCOSAL
  Filled 2022-08-30: qty 15

## 2022-08-30 MED ORDER — METHOCARBAMOL 500 MG PO TABS
500.0000 mg | ORAL_TABLET | Freq: Four times a day (QID) | ORAL | Status: DC | PRN
  Administered 2022-08-31 (×2): 500 mg via ORAL
  Filled 2022-08-30 (×2): qty 1

## 2022-08-30 MED ORDER — OXYCODONE HCL 5 MG PO TABS
5.0000 mg | ORAL_TABLET | Freq: Once | ORAL | Status: AC | PRN
  Administered 2022-08-30: 5 mg via ORAL

## 2022-08-30 MED ORDER — FENTANYL CITRATE (PF) 100 MCG/2ML IJ SOLN
25.0000 ug | INTRAMUSCULAR | Status: DC | PRN
  Administered 2022-08-30 (×3): 50 ug via INTRAVENOUS

## 2022-08-30 MED ORDER — PHENOL 1.4 % MT LIQD: 1.0000 | OROMUCOSAL | Status: DC | PRN

## 2022-08-30 MED ORDER — LIDOCAINE 2% (20 MG/ML) 5 ML SYRINGE
INTRAMUSCULAR | Status: DC | PRN
  Administered 2022-08-30: 100 mg via INTRAVENOUS

## 2022-08-30 MED ORDER — HYDROMORPHONE HCL 1 MG/ML IJ SOLN
INTRAMUSCULAR | Status: AC
  Filled 2022-08-30: qty 1

## 2022-08-30 MED ORDER — OXYCODONE HCL 5 MG/5ML PO SOLN: 5.0000 mg | Freq: Once | ORAL | Status: AC | PRN

## 2022-08-30 MED ORDER — BUPIVACAINE LIPOSOME 1.3 % IJ SUSP
INTRAMUSCULAR | Status: DC | PRN
  Administered 2022-08-30: 10 mL via PERINEURAL

## 2022-08-30 MED ORDER — DEXAMETHASONE SODIUM PHOSPHATE 10 MG/ML IJ SOLN
INTRAMUSCULAR | Status: AC
  Filled 2022-08-30: qty 1

## 2022-08-30 MED ORDER — OXYCODONE HCL 5 MG PO TABS
5.0000 mg | ORAL_TABLET | ORAL | Status: DC | PRN
  Administered 2022-08-31 (×3): 5 mg via ORAL
  Filled 2022-08-30 (×4): qty 1

## 2022-08-30 MED ORDER — INSULIN ASPART 100 UNIT/ML IJ SOLN
0.0000 [IU] | Freq: Three times a day (TID) | INTRAMUSCULAR | Status: DC
  Administered 2022-08-30 – 2022-08-31 (×2): 3 [IU] via SUBCUTANEOUS
  Administered 2022-08-31: 5 [IU] via SUBCUTANEOUS

## 2022-08-30 MED ORDER — FENTANYL CITRATE (PF) 100 MCG/2ML IJ SOLN
INTRAMUSCULAR | Status: AC
  Administered 2022-08-30: 50 ug via INTRAVENOUS
  Filled 2022-08-30: qty 2

## 2022-08-30 MED ORDER — PHENYLEPHRINE 80 MCG/ML (10ML) SYRINGE FOR IV PUSH (FOR BLOOD PRESSURE SUPPORT)
PREFILLED_SYRINGE | INTRAVENOUS | Status: AC
  Filled 2022-08-30: qty 10

## 2022-08-30 MED ORDER — CEFAZOLIN SODIUM-DEXTROSE 2-3 GM-%(50ML) IV SOLR
INTRAVENOUS | Status: DC | PRN
  Administered 2022-08-30: 2 g via INTRAVENOUS

## 2022-08-30 MED ORDER — ACETAMINOPHEN 500 MG PO TABS
1000.0000 mg | ORAL_TABLET | Freq: Once | ORAL | Status: AC
  Administered 2022-08-30: 1000 mg via ORAL
  Filled 2022-08-30: qty 2

## 2022-08-30 MED ORDER — IRRISEPT - 450ML BOTTLE WITH 0.05% CHG IN STERILE WATER, USP 99.95% OPTIME
TOPICAL | Status: DC | PRN
  Administered 2022-08-30: 450 mL

## 2022-08-30 MED ORDER — PROPOFOL 10 MG/ML IV BOLUS
INTRAVENOUS | Status: AC
  Filled 2022-08-30: qty 20

## 2022-08-30 MED ORDER — BUPIVACAINE-EPINEPHRINE (PF) 0.5% -1:200000 IJ SOLN
INTRAMUSCULAR | Status: DC | PRN
  Administered 2022-08-30: 15 mL via PERINEURAL

## 2022-08-30 SURGICAL SUPPLY — 80 items
AID PSTN UNV HD RSTRNT DISP (MISCELLANEOUS) ×1
ALCOHOL 70% 16 OZ (MISCELLANEOUS) ×1 IMPLANT
APL PRP STRL LF DISP 70% ISPRP (MISCELLANEOUS) ×2
BAG COUNTER SPONGE SURGICOUNT (BAG) ×1 IMPLANT
BAG SPNG CNTER NS LX DISP (BAG) ×1
BASEPLATE AUG MED W-TAPER (Plate) IMPLANT
BIT DRILL 2.7 W/STOP DISP (BIT) IMPLANT
BIT DRILL QUICK REL 1/8 2PK SL (DRILL) IMPLANT
BIT DRILL TWIST 2.7 (BIT) IMPLANT
BLADE SAW SGTL 13X75X1.27 (BLADE) ×1 IMPLANT
BRNG HUM +3 36 RVRS SHLDR (Shoulder) ×1 IMPLANT
BSPLAT GLND MED AUG TPR ADPR (Plate) ×1 IMPLANT
CHLORAPREP W/TINT 26 (MISCELLANEOUS) ×1 IMPLANT
COOLER ICEMAN CLASSIC (MISCELLANEOUS) ×1 IMPLANT
COVER SURGICAL LIGHT HANDLE (MISCELLANEOUS) ×1 IMPLANT
DRAPE INCISE IOBAN 66X45 STRL (DRAPES) ×1 IMPLANT
DRAPE U-SHAPE 47X51 STRL (DRAPES) ×2 IMPLANT
DRILL QUICK RELEASE 1/8 INCH (DRILL) ×1
DRSG AQUACEL AG ADV 3.5X10 (GAUZE/BANDAGES/DRESSINGS) IMPLANT
ELECT BLADE 4.0 EZ CLEAN MEGAD (MISCELLANEOUS) ×1
ELECT REM PT RETURN 9FT ADLT (ELECTROSURGICAL) ×1
ELECTRODE BLDE 4.0 EZ CLN MEGD (MISCELLANEOUS) ×1 IMPLANT
ELECTRODE REM PT RTRN 9FT ADLT (ELECTROSURGICAL) ×1 IMPLANT
GLENOID SPHERE 36MM CVD +3 (Orthopedic Implant) IMPLANT
GLOVE BIOGEL PI IND STRL 7.0 (GLOVE) ×1 IMPLANT
GLOVE BIOGEL PI IND STRL 8 (GLOVE) ×1 IMPLANT
GLOVE ECLIPSE 7.0 STRL STRAW (GLOVE) ×1 IMPLANT
GLOVE ECLIPSE 8.0 STRL XLNG CF (GLOVE) ×1 IMPLANT
GOWN STRL REUS W/ TWL LRG LVL3 (GOWN DISPOSABLE) ×1 IMPLANT
GOWN STRL REUS W/ TWL XL LVL3 (GOWN DISPOSABLE) ×1 IMPLANT
GOWN STRL REUS W/TWL LRG LVL3 (GOWN DISPOSABLE) ×1
GOWN STRL REUS W/TWL XL LVL3 (GOWN DISPOSABLE) ×1
GUIDE MODEL REV SHLD LT (ORTHOPEDIC DISPOSABLE SUPPLIES) IMPLANT
HYDROGEN PEROXIDE 16OZ (MISCELLANEOUS) ×1 IMPLANT
JET LAVAGE IRRISEPT WOUND (IRRIGATION / IRRIGATOR) ×1
KIT BASIN OR (CUSTOM PROCEDURE TRAY) ×1 IMPLANT
KIT TURNOVER KIT B (KITS) ×1 IMPLANT
LAVAGE JET IRRISEPT WOUND (IRRIGATION / IRRIGATOR) ×1 IMPLANT
MANIFOLD NEPTUNE II (INSTRUMENTS) ×1 IMPLANT
NDL SUT 6 .5 CRC .975X.05 MAYO (NEEDLE) IMPLANT
NDL TAPERED W/ NITINOL LOOP (MISCELLANEOUS) ×1 IMPLANT
NEEDLE MAYO TAPER (NEEDLE) ×1
NEEDLE TAPERED W/ NITINOL LOOP (MISCELLANEOUS) ×1 IMPLANT
NS IRRIG 1000ML POUR BTL (IV SOLUTION) ×1 IMPLANT
PACK SHOULDER (CUSTOM PROCEDURE TRAY) ×1 IMPLANT
PAD ARMBOARD 7.5X6 YLW CONV (MISCELLANEOUS) ×2 IMPLANT
PASSER SUT SWANSON 36MM LOOP (INSTRUMENTS) ×1 IMPLANT
PIN HUMERAL STMN 3.2MMX9IN (INSTRUMENTS) IMPLANT
PIN THREADED REVERSE (PIN) IMPLANT
REAMER GUIDE BUSHING SURG DISP (MISCELLANEOUS) IMPLANT
REAMER GUIDE W/SCREW AUG (MISCELLANEOUS) IMPLANT
RESTRAINT HEAD UNIVERSAL NS (MISCELLANEOUS) ×1 IMPLANT
SCREW BONE LOCKING 4.75X30X3.5 (Screw) IMPLANT
SCREW BONE STRL 6.5MMX30MM (Screw) IMPLANT
SCREW LOCKING 4.75MMX15MM (Screw) IMPLANT
SCREW LOCKING STRL 4.75X25X3.5 (Screw) IMPLANT
SLING ARM IMMOBILIZER LRG (SOFTGOODS) IMPLANT
SOL PREP POV-IOD 4OZ 10% (MISCELLANEOUS) ×1 IMPLANT
SPONGE T-LAP 18X18 ~~LOC~~+RFID (SPONGE) ×1 IMPLANT
STEM HUMERAL STRL 12MMX83MM (Stem) IMPLANT
STRIP CLOSURE SKIN 1/2X4 (GAUZE/BANDAGES/DRESSINGS) IMPLANT
SUCTION FRAZIER HANDLE 10FR (MISCELLANEOUS) ×1
SUCTION TUBE FRAZIER 10FR DISP (MISCELLANEOUS) ×1 IMPLANT
SUT BROADBAND TAPE 2PK 1.5 (SUTURE) IMPLANT
SUT FIBERWIRE #2 38 T-5 BLUE (SUTURE)
SUT MAXBRAID (SUTURE) IMPLANT
SUT MNCRL AB 3-0 PS2 18 (SUTURE) IMPLANT
SUT SILK 2 0 TIES 10X30 (SUTURE) ×1 IMPLANT
SUT VIC AB 0 CT1 27 (SUTURE) ×2
SUT VIC AB 0 CT1 27XBRD ANBCTR (SUTURE) IMPLANT
SUT VIC AB 1 CT1 27 (SUTURE) ×4
SUT VIC AB 1 CT1 27XBRD ANBCTR (SUTURE) IMPLANT
SUT VIC AB 2-0 CT1 27 (SUTURE) ×4
SUT VIC AB 2-0 CT1 TAPERPNT 27 (SUTURE) IMPLANT
SUT VICRYL 0 UR6 27IN ABS (SUTURE) IMPLANT
SUTURE FIBERWR #2 38 T-5 BLUE (SUTURE) IMPLANT
TOWEL GREEN STERILE (TOWEL DISPOSABLE) ×1 IMPLANT
TRAY HUM REV SHOULDER 36 +3 (Shoulder) IMPLANT
TRAY HUM REV SHOULDER STD +6 (Shoulder) IMPLANT
WATER STERILE IRR 1000ML POUR (IV SOLUTION) ×1 IMPLANT

## 2022-08-30 NOTE — Op Note (Unsigned)
NAMEACHILLIES, Duane Cuevas MEDICAL RECORD NO: [id number] ACCOUNT NO: [id number] DATE OF BIRTH: Sep 26, 1955 FACILITY: [facility] LOCATION: [facility] PHYSICIAN: Yetta Barre. Marlou Sa, MD  Operative Report   DATE OF PROCEDURE: 08/30/2022  PREOPERATIVE DIAGNOSIS:  Left shoulder rotator cuff arthropathy, biceps tendinitis and retained hardware.  POSTOPERATIVE DIAGNOSIS:  Left shoulder rotator cuff arthropathy, biceps tendinitis and retained hardware.  PROCEDURE:  Left shoulder reverse shoulder replacement using [employer] medium augmented baseplate with 624THL glenosphere, size 12 x 83 mm mini humeral stem with 40 mm humeral tray +6 offset and 36 mm +3 polyethylene bearing with 1 central compression  screw and 4 peripheral locking screws in the baseplate.  SURGEON:  Yetta Barre. Marlou Sa, MD  ASSISTANT:  Annie Main.  INDICATIONS:  This is a 67 year old patient with left shoulder pain who sustained an injury with a fall several months ago.  She presents now for operative management after explanation of risks and benefits.  DESCRIPTION OF PROCEDURE:  The patient was brought to the operating room where general anesthetic was induced.  Preoperative antibiotics administered.  Timeout was called.  The patient was placed in the beach chair position with the arm in neutral  position.  Left arm, shoulder and hand prescrubbed with hydrogen peroxide followed by alcohol and Betadine, which was allowed to air dry, then prepped with ChloraPrep solution and draped in sterile manner.  Ioban used to seal the operative field.  The  patient's preoperative range of motion passively was about 70/95/155.  After calling timeout, deltopectoral approach was made.  IrriSept solution utilized after the initial incision and at multiple times during the case.  The patient did not have a  cephalic vein.  Plane between the deltoid and the pectoralis major tendon was developed beginning at the coracoid process.  About the upper 1.5 cm the pec was  released.  Kolbel retractor was placed.  Axillary nerve was protected, but not immediately  palpable underneath the subscap.  Muscle relaxer was reversed.  Next, the anterior portion of the deltoid attachment was elevated manually off the deltoid.  Subacromial and subdeltoid adhesions were released.  The Kolbel retractor was placed.  Biceps  tendon was then tenodesed to the pec tendon using 5-0 Vicryl sutures.  The tendon was then elevated and the transverse humeral ligament was released and the tendon was traced into the glenohumeral joint.  The patient had tearing of the infraspinatus and  supraspinatus with retraction.  At this time, circumflex vessels were ligated.  Next, the subscapularis was detached from the lesser tuberosity using a 15-blade and 2 tagging sutures.  This was performed around to the 7 o'clock position on the humeral  neck.  A Cobb elevator utilized with a sponge in order to elevate the capsule off the inferior 2 cm of the humeral neck.  This allowed very good mobilization of the humerus.  Next, the humeral head was delivered into the incision and a Psychiatrist  was placed along with reverse Journalist, newspaper.  Next reaming was performed up to a size 11 reamer.  Hardware was removed, which were 3 screws from the humeral head.  These were metal screws.  Next, the head was cut in 30 degrees of retroversion, which  matched the patient's normal version.  Broaching was then performed up to a size 12.  Cap was placed.  Attention then directed towards the glenoid.  Labrum was circumferentially removed with the electrocautery.  Bankart lesion created from the 6 o'clock  to 12 o'clock position with care being  taken to avoid injury to underlying neurovascular structures. With the posterior retractor placed, the guide was placed onto the glenoid.  Next, with patient-specific instrumentation, the measured reaming depth of  6-1/2 mm from the inferior glenoid was performed.  Next, the reaming for  the augment was performed.  At this time, the true glenosphere was placed with excellent contact obtained.  One central compression screw and four peripheral locking screws were  placed for secure fixation of the glenosphere.  At this time, a 36+3 glenosphere was placed.  On the humeral side with the trial implant in position we trialled a +6 humeral tray offset along the standard thickness polyethylene and +3 polyethylene.  Both  had good stability.  At this time, the trial implants were removed.  The true glenosphere was placed with 1.5 mm inferior offset.  The 6 suture tapes were placed through the lesser tuberosity and after placing IrriSept solution followed by vancomycin  powder the stem was placed.  A very good press fit was obtained.  Next, a trial reduction was performed with both the standard liner and the +3 liner.  Optimal stability was achieved with the +3 liner and we were able to pull the subscap over back to its  attachment site with that construct in position.  Difficult to reduce and difficult to dislocate, indicating good tension.  Next, the head was dislocated.  The true components were placed with same stability parameters maintained.  A very good snug  reduction was achieved, which was "two fingers tight."  No instability with adduction and extension and a forward force.  The patient also had very good stable range of motion overhead.  A thorough irrigation was performed.  The subscap was repaired back  to the lesser tuberosity using the suture tapes with the arm in about 30 degrees of external rotation.  IrriSept solution utilized and vancomycin powder placed on the implant.  Deltopectoral interval was then closed using #1 Vicryl suture followed by  interrupted inverted 0 Vicryl suture, 2-0 Vicryl suture, and 3-0 Monocryl.  Steri-Strips, Aquacel dressing, and shoulder immobilizer placed.  The patient tolerated the procedure well without immediate complications, transferred to the  recovery room in  stable condition.  Luke's assistance was required at all times for retraction, opening, closing, mobilization of tissue.  His assistance was a medical necessity   PUS D: 08/30/2022 2:24:11 pm T: 08/30/2022 2:49:00 pm  JOB: [id number]
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
## 2022-08-30 NOTE — Anesthesia Procedure Notes (Signed)
Procedure Name: Intubation Date/Time: 08/30/2022 11:22 AM  Performed by: Elvin So, CRNAPre-anesthesia Checklist: Patient identified, Emergency Drugs available, Suction available and Patient being monitored Patient Re-evaluated:Patient Re-evaluated prior to induction Oxygen Delivery Method: Circle System Utilized Preoxygenation: Pre-oxygenation with 100% oxygen Induction Type: IV induction Ventilation: Mask ventilation without difficulty Laryngoscope Size: Mac and 4 Grade View: Grade I Tube type: Oral Tube size: 7.5 mm Number of attempts: 1 Airway Equipment and Method: Stylet and Oral airway Placement Confirmation: ETT inserted through vocal cords under direct vision, positive ETCO2 and breath sounds checked- equal and bilateral Secured at: 22 cm Tube secured with: Tape Dental Injury: Teeth and Oropharynx as per pre-operative assessment

## 2022-08-30 NOTE — Transfer of Care (Signed)
Immediate Anesthesia Transfer of Care Note  Patient: Duane Cuevas  Procedure(s) Performed: LEFT REVERSE SHOULDER ARTHROPLASTY (Left: Shoulder)  Patient Location: PACU  Anesthesia Type:General  Level of Consciousness: awake and patient cooperative  Airway & Oxygen Therapy: Patient Spontanous Breathing and Patient connected to face mask oxygen  Post-op Assessment: Report given to RN, Post -op Vital signs reviewed and stable, and Patient moving all extremities  Post vital signs: Reviewed and stable  Last Vitals:  Vitals Value Taken Time  BP 124/77 08/30/22 1442  Temp    Pulse 92 08/30/22 1443  Resp 20 08/30/22 1443  SpO2 100 % 08/30/22 1443  Vitals shown include unvalidated device data.  Last Pain:  Vitals:   08/30/22 1045  TempSrc:   PainSc: 0-No pain         Complications: No notable events documented.

## 2022-08-30 NOTE — Anesthesia Procedure Notes (Addendum)
Anesthesia Regional Block: Interscalene brachial plexus block   Pre-Anesthetic Checklist: , timeout performed,  Correct Patient, Correct Site, Correct Laterality,  Correct Procedure, Correct Position, site marked,  Risks and benefits discussed,  Pre-op evaluation,  At surgeon's request and post-op pain management  Laterality: Left  Prep: Maximum Sterile Barrier Precautions used, chloraprep       Needles:  Injection technique: Single-shot  Needle Type: Echogenic Stimulator Needle     Needle Length: 4cm  Needle Gauge: 22     Additional Needles:   Procedures:,,,, ultrasound used (permanent image in chart),,    Narrative:  Start time: 08/30/2022 10:38 AM End time: 08/30/2022 10:41 AM Injection made incrementally with aspirations every 5 mL.  Performed by: Personally  Anesthesiologist: Brennan Bailey, MD  Additional Notes: Risks, benefits, and alternative discussed. Patient gave consent for procedure. Patient prepped and draped in sterile fashion. Sedation administered, patient remains easily responsive to voice. Relevant anatomy identified with ultrasound guidance. Local anesthetic given in 5cc increments with no signs or symptoms of intravascular injection. No pain or paraesthesias with injection. Patient monitored throughout procedure with signs of LAST or immediate complications. Tolerated well. Ultrasound image placed in chart.  Tawny Asal, MD

## 2022-08-30 NOTE — Anesthesia Postprocedure Evaluation (Signed)
Anesthesia Post Note  Patient: Hettie Holstein  Procedure(s) Performed: LEFT REVERSE SHOULDER ARTHROPLASTY (Left: Shoulder)     Patient location during evaluation: PACU Anesthesia Type: General Level of consciousness: awake and alert Pain management: pain level controlled Vital Signs Assessment: post-procedure vital signs reviewed and stable Respiratory status: spontaneous breathing, nonlabored ventilation and respiratory function stable Cardiovascular status: blood pressure returned to baseline Postop Assessment: no apparent nausea or vomiting Anesthetic complications: no   No notable events documented.  Last Vitals:  Vitals:   08/30/22 1515 08/30/22 1530  BP: 114/78   Pulse: 89 85  Resp: 17 15  Temp:    SpO2: 95% 94%                Marthenia Rolling

## 2022-08-30 NOTE — Brief Op Note (Signed)
   08/30/2022  2:14 PM  PATIENT:  Duane Cuevas  67 y.o. male  PRE-OPERATIVE DIAGNOSIS:  LEFT ROTATOR CUFF ARTHROPATHY, biceps tendinitis and retained hardware  POST-OPERATIVE DIAGNOSIS:  LEFT ROTATOR CUFF ARTHROPATHY, biceps tendinitis and retained hardware  PROCEDURE:  Procedure(s): LEFT REVERSE SHOULDER ARTHROPLASTY, biceps tenodesis and removal of retained hardware  SURGEON:  Surgeon(s): Marlou Sa, Tonna Corner, MD  ASSISTANT: Annie Main, PA  ANESTHESIA:   General  EBL: 100 ml    Total I/O In: 1000 [I.V.:1000] Out: 100 [Blood:100]  BLOOD ADMINISTERED: none  DRAINS: none   LOCAL MEDICATIONS USED: Vancomycin powder  SPECIMEN:  No Specimen  COUNTS:  YES  TOURNIQUET:  * No tourniquets in log *  DICTATION: .Other Dictation: Dictation Number [id number]  PLAN OF CARE: Admit for overnight observation  PATIENT DISPOSITION:  PACU - hemodynamically stable

## 2022-08-30 NOTE — Plan of Care (Signed)
  Problem: Education: Goal: Knowledge of General Education information will improve Description: Including pain rating scale, medication(s)/side effects and non-pharmacologic comfort measures Outcome: Progressing   Problem: Clinical Measurements: Goal: Will remain free from infection Outcome: Progressing   Problem: Activity: Goal: Risk for activity intolerance will decrease Outcome: Progressing   Problem: Nutrition: Goal: Adequate nutrition will be maintained Outcome: Progressing   Problem: Coping: Goal: Level of anxiety will decrease Outcome: Progressing   Problem: Elimination: Goal: Will not experience complications related to bowel motility Outcome: Progressing   Problem: Pain Managment: Goal: General experience of comfort will improve Outcome: Progressing

## 2022-08-31 ENCOUNTER — Encounter (HOSPITAL_COMMUNITY): Payer: Self-pay | Admitting: Orthopedic Surgery

## 2022-08-31 DIAGNOSIS — M7522 Bicipital tendinitis, left shoulder: Secondary | ICD-10-CM

## 2022-08-31 DIAGNOSIS — M19012 Primary osteoarthritis, left shoulder: Secondary | ICD-10-CM | POA: Diagnosis not present

## 2022-08-31 DIAGNOSIS — Z96612 Presence of left artificial shoulder joint: Secondary | ICD-10-CM | POA: Diagnosis not present

## 2022-08-31 DIAGNOSIS — Z969 Presence of functional implant, unspecified: Secondary | ICD-10-CM

## 2022-08-31 DIAGNOSIS — J449 Chronic obstructive pulmonary disease, unspecified: Secondary | ICD-10-CM | POA: Diagnosis not present

## 2022-08-31 DIAGNOSIS — E119 Type 2 diabetes mellitus without complications: Secondary | ICD-10-CM | POA: Diagnosis not present

## 2022-08-31 DIAGNOSIS — Z79899 Other long term (current) drug therapy: Secondary | ICD-10-CM | POA: Diagnosis not present

## 2022-08-31 DIAGNOSIS — Z794 Long term (current) use of insulin: Secondary | ICD-10-CM | POA: Diagnosis not present

## 2022-08-31 DIAGNOSIS — R69 Illness, unspecified: Secondary | ICD-10-CM | POA: Diagnosis not present

## 2022-08-31 DIAGNOSIS — M12812 Other specific arthropathies, not elsewhere classified, left shoulder: Secondary | ICD-10-CM

## 2022-08-31 DIAGNOSIS — M75102 Unspecified rotator cuff tear or rupture of left shoulder, not specified as traumatic: Secondary | ICD-10-CM | POA: Diagnosis not present

## 2022-08-31 LAB — GLUCOSE, CAPILLARY
Glucose-Capillary: 193 mg/dL — ABNORMAL HIGH (ref 70–99)
Glucose-Capillary: 212 mg/dL — ABNORMAL HIGH (ref 70–99)

## 2022-08-31 MED ORDER — OXYCODONE HCL 5 MG PO TABS: 5.0000 mg | ORAL_TABLET | ORAL | 0 refills | Status: DC | PRN

## 2022-08-31 MED ORDER — ASPIRIN 81 MG PO TBEC: 81.0000 mg | DELAYED_RELEASE_TABLET | Freq: Every day | ORAL | 0 refills | Status: AC

## 2022-08-31 MED ORDER — METHOCARBAMOL 500 MG PO TABS: 500.0000 mg | ORAL_TABLET | Freq: Three times a day (TID) | ORAL | 0 refills | Status: DC | PRN

## 2022-08-31 MED ORDER — VANCOMYCIN HCL IN DEXTROSE 1-5 GM/200ML-% IV SOLN
1000.0000 mg | INTRAVENOUS | Status: AC
  Administered 2022-08-31: 1000 mg via INTRAVENOUS
  Filled 2022-08-31: qty 200

## 2022-08-31 NOTE — Progress Notes (Signed)
  Subjective: Duane Cuevas is a 67 y.o. male s/p left RSA.  They are POD 1.  Pt's pain is controlled.  He denies any significant chest pain but he does note some chest congestion and a little bit of difficulty breathing which is improved with oxygen.  He has history of COPD and he states that this feels pretty similar to his baseline with no worsening since surgery.  No racing heart or palpitations.  No abdominal pain.  No fevers or chills.  Block is still in effect.  Objective: Vital signs in last 24 hours: Temp:  [97.7 F (36.5 C)-98.5 F (36.9 C)] 98 F (36.7 C) (02/27 1944) Pulse Rate:  [81-98] 89 (02/28 0805) Resp:  [13-20] 18 (02/28 0805) BP: (95-129)/(65-88) 107/65 (02/28 0805) SpO2:  [93 %-99 %] 99 % (02/28 0805) Weight:  [73 kg] 73 kg (02/27 0856)  Intake/Output from previous day: 02/27 0701 - 02/28 0700 In: 2423.3 [P.O.:200; I.V.:2223.3] Out: 1400 [Urine:1300; Blood:100] Intake/Output this shift: Total I/O In: -  Out: 350 [Urine:350]  Exam:  No gross blood or drainage overlying the dressing 2+ radial pulse of the operative extremity Postoperative physical exam somewhat limited by interscalene block but intact slight finger flexion of all 5 fingers but really he does not have any significant return of motor function of the left arm yet.   Labs: No results for input(s): "HGB" in the last 72 hours. No results for input(s): "WBC", "RBC", "HCT", "PLT" in the last 72 hours. No results for input(s): "NA", "K", "CL", "CO2", "BUN", "CREATININE", "GLUCOSE", "CALCIUM" in the last 72 hours. No results for input(s): "LABPT", "INR" in the last 72 hours.  Assessment/Plan: Pt is POD 1 s/p left RSA    -Plan to discharge to home likely today    -Plan for patient to have 1 g of vancomycin IV to cover for positive MRSA PCR screen preoperatively.  We will also have him work with occupational therapy and he should be okay to discharge home after that.  His wife will be helping him  after surgery.  -No lifting with the operative arm  -Stay in sling except for showering/sleeping and using CPM machine at home.  No lifting with the operative arm more than 1 to 2 pounds  -Follow-up with Dr. Marlou Sa or myself in clinic 2 weeks postoperatively    [facility] 08/31/2022, 8:23 AM

## 2022-08-31 NOTE — Progress Notes (Signed)
Discharge instructions are given to the pt. The Spanish interpreter is at the bedside. All questions answered.

## 2022-08-31 NOTE — Evaluation (Signed)
Occupational Therapy Evaluation Patient Details Name: Duane Cuevas MRN: [id number] DOB: 12-Apr-1956 Today's Date: 08/31/2022   History of Present Illness Pt is a 67 y.o. male s/p L reverse shoulder arthroplasty with biceps tenodesis and removal of retained hardware on 2/27. PMH significant for L shoulder rotator cuff repain, DM, COPD.   Clinical Impression   PTA, pt lived with his wife and reports he was independent. Upon eval, pt requires up to mod A for UB ADL and supervision for LB ADL. Pt educated and demonstrating use of compensatory techniques for sleep position, bed mobility, UB ADL, LB ADL, grooming, transfers within precautions. Pt educated regarding precautions, weightbearing, ICEMAN, recommended hand/wrist/elbow exercises, and pendulums. Recommending following physicians orders for follow up therapies.      Recommendations for follow up therapy are one component of a multi-disciplinary discharge planning process, led by the attending physician.  Recommendations may be updated based on patient status, additional functional criteria and insurance authorization.   Follow Up Recommendations  Follow physician's recommendations for discharge plan and follow up therapies     Assistance Recommended at Discharge Intermittent Supervision/Assistance  Patient can return home with the following A little help with bathing/dressing/bathroom;Assistance with cooking/housework;Assist for transportation;Help with stairs or ramp for entrance    Functional Status Assessment  Patient has had a recent decline in their functional status and demonstrates the ability to make significant improvements in function in a reasonable and predictable amount of time.  Equipment Recommendations  None recommended by OT (Pt not interested)    Recommendations for Other Services       Precautions / Restrictions Precautions Precautions: Shoulder Type of Shoulder Precautions: L reverse shoulder arthroplasty.  No AROM of shoulder; ext rotation 0-30 OK. Pendulums, hand/wrist/elbow exercises ok. Has sling Shoulder Interventions: Shoulder sling/immobilizer;Off for dressing/bathing/exercises Precaution Booklet Issued: Yes (comment) Precaution Comments: Spanish shoulder d/c handout provided. Elbow/wrist/hand, pendulums, and sling instructions also provided, but spanish version not available. Pt verbalized and demonstrated understanding of all information provided. Restrictions Weight Bearing Restrictions: Yes LUE Weight Bearing: Non weight bearing      Mobility Bed Mobility Overal bed mobility: Modified Independent             General bed mobility comments: reviewed compensatory techniques    Transfers Overall transfer level: Needs assistance Equipment used: None Transfers: Sit to/from Stand Sit to Stand: Supervision           General transfer comment: for safety      Balance Overall balance assessment: Mild deficits observed, not formally tested                                         ADL either performed or assessed with clinical judgement   ADL Overall ADL's : Needs assistance/impaired Eating/Feeding: Minimal assistance;Sitting Eating/Feeding Details (indicate cue type and reason): for bil tasks Grooming: Minimal assistance;Sitting Grooming Details (indicate cue type and reason): for bil tasks due to nerve block Upper Body Bathing: Supervision/ safety;Standing Upper Body Bathing Details (indicate cue type and reason): reviewed compensatory technique Lower Body Bathing: Supervison/ safety;Sit to/from stand   Upper Body Dressing : Moderate assistance;Sitting Upper Body Dressing Details (indicate cue type and reason): Mod A for donning on L arm and for buttons. Mod A also for donning sling; pt likely with significantly improved independence with AROM of elbow, but reporting wife can assist at home as well. Lower Body Dressing: Supervision/safety;Sit to/from  stand Lower Body Dressing Details (indicate cue type and reason): donning pants during session Toilet Transfer: Supervision/safety;Ambulation;Regular Glass blower/designer Details (indicate cue type and reason): simulated in room Toileting- Clothing Manipulation and Hygiene: Supervision/safety;Sitting/lateral lean   Tub/ Shower Transfer: Supervision/safety;Ambulation   Functional mobility during ADLs: Supervision/safety       Vision Baseline Vision/History: 1 Wears glasses (reading glasses) Ability to See in Adequate Light: 0 Adequate Patient Visual Report: No change from baseline Vision Assessment?: No apparent visual deficits Additional Comments: reading spanish shoulder discharge sheet during session     Perception Perception Perception Tested?: No   Praxis Praxis Praxis tested?: Within functional limits    Pertinent Vitals/Pain Pain Assessment Pain Assessment: Faces Faces Pain Scale: Hurts little more Pain Location: L arm Pain Descriptors / Indicators: Operative site guarding, Discomfort Pain Intervention(s): Limited activity within patient's tolerance, Monitored during session     Hand Dominance Right   Extremity/Trunk Assessment Upper Extremity Assessment Upper Extremity Assessment: LUE deficits/detail LUE Deficits / Details: nerve block still active. Some ROM of fingers, but thumb difficult to aduct/extend at this time. AAROM for full range pronation/supination, no active elbow flexion at time of eval. Pt verbalizing understanding of recommended shoulder precautions. LUE Coordination: decreased gross motor;decreased fine motor   Lower Extremity Assessment Lower Extremity Assessment: Overall WFL for tasks assessed (for BADL)       Communication Communication Communication: Prefers language other than [place];No difficulties (Interpreter used on pt request)   Cognition Arousal/Alertness: Awake/alert Behavior During Therapy: WFL for tasks  assessed/performed Overall Cognitive Status: Within Functional Limits for tasks assessed                                 General Comments: Verbalized understanding of all information provided     General Comments  VSS on RA, Pt does use home oxygen as needed per pt. HR max of 102 during exercise    Exercises Exercises: Shoulder Shoulder Exercises Pendulum Exercise: PROM, Left, 10 reps, Seated, Standing Shoulder Flexion:  (Not recommended per MD; has mobilizing equipment provided by MD at home.) Shoulder Extension:  (Not recommended per MD; has mobilizing equipment provided by MD at home.) Shoulder ABduction:  (Not recommended per MD; has mobilizing equipment provided by MD at home.) Shoulder External Rotation:  (0-30 allowed, pt educated) Elbow Flexion: AAROM, 10 reps, Left, Seated Elbow Extension: AAROM, 10 reps, Seated Wrist Flexion: AAROM, Left, 10 reps, Seated Wrist Extension: AAROM, Left, 10 reps, Seated Digit Composite Flexion: AAROM, 10 reps, Seated, Left (Pt manually facilitating thumb placement with RUE) Composite Extension: AAROM, Left, 10 reps, Seated (Pt using RUE to extend thumb) Neck Flexion: AROM, Seated, 5 reps Neck Extension: AROM, 5 reps, Seated Neck Lateral Flexion - Right: AROM, 5 reps, Seated Neck Lateral Flexion - Left: AROM, 5 reps, Seated   Shoulder Instructions Shoulder Instructions Donning/doffing shirt without moving shoulder: Moderate assistance;Patient able to independently direct caregiver Method for sponge bathing under operated UE: Supervision/safety Donning/doffing sling/immobilizer: Moderate assistance;Patient able to independently direct caregiver Correct positioning of sling/immobilizer: Supervision/safety;Patient able to independently direct caregiver Pendulum exercises (written home exercise program): Supervision/safety (Recommended to continue perfecting technique) ROM for elbow, wrist and digits of operated UE: Supervision/safety  (AAROM due to nerve block still active) Sling wearing schedule (on at all times/off for ADL's): Modified independent Proper positioning of operated UE when showering: Modified independent Positioning of UE while sleeping: Modified independent    Home Living Family/patient expects  to be discharged to:: Private residence Living Arrangements: Spouse/significant other Available Help at Discharge: Family;Available 24 hours/day Type of Home: House (town house) Home Access: Level entry     Home Layout: Two level Alternate Level Stairs-Number of Steps: 10 Alternate Level Stairs-Rails: Right (going up (per previous notes, R and L)) Bathroom Shower/Tub: Walk-in shower (per pt, 6 in threshold)   Bathroom Toilet: Standard     Home Equipment: None          Prior Functioning/Environment Prior Level of Function : Independent/Modified Independent             Mobility Comments: no AD ADLs Comments: Pt reports indpendent        OT Problem List: Decreased strength;Decreased activity tolerance;Impaired balance (sitting and/or standing);Decreased range of motion;Decreased knowledge of precautions;Impaired UE functional use      OT Treatment/Interventions: Self-care/ADL training;Therapeutic exercise;DME and/or AE instruction;Patient/family education;Balance training;Therapeutic activities    OT Goals(Current goals can be found in the care plan section) Acute Rehab OT Goals Patient Stated Goal: go home OT Goal Formulation: With patient Time For Goal Achievement: 09/14/22 Potential to Achieve Goals: Good  OT Frequency: Min 2X/week    Co-evaluation              AM-PAC OT "6 Clicks" Daily Activity     Outcome Measure Help from another person eating meals?: A Little Help from another person taking care of personal grooming?: A Little Help from another person toileting, which includes using toliet, bedpan, or urinal?: A Little Help from another person bathing (including washing,  rinsing, drying)?: A Little Help from another person to put on and taking off regular upper body clothing?: A Lot Help from another person to put on and taking off regular lower body clothing?: A Little 6 Click Score: 17   End of Session Equipment Utilized During Treatment: Other (comment) (sling) Nurse Communication: Mobility status  Activity Tolerance: Patient tolerated treatment well Patient left: in bed;with call bell/phone within reach  OT Visit Diagnosis: Unsteadiness on feet (R26.81);Muscle weakness (generalized) (M62.81)                Time: [id number] OT Time Calculation (min): 41 min Charges:  OT General Charges $OT Visit: 1 Visit OT Evaluation $OT Eval Low Complexity: 1 Low OT Treatments $Self Care/Home Management : 8-22 mins $Therapeutic Exercise: 8-22 mins  Magnus Ivan, OTD, OTR/L [facility] Acute Rehabilitation Office: [phone]   Magnus Ivan 08/31/2022, 10:12 AM

## 2022-08-31 NOTE — Evaluation (Signed)
Physical Therapy Evaluation and Discharge Patient Details Name: Duane Cuevas MRN: [id number] DOB: [birth year] Today's Date: 08/31/2022  History of Present Illness  Pt is a 67 y.o. male s/p L reverse shoulder arthroplasty with biceps tenodesis and removal of retained hardware on 2/27. PMH significant for L shoulder rotator cuff repain, DM, COPD.  Clinical Impression   Patient evaluated by Physical Therapy with no further acute PT needs identified. All education has been completed and the patient has no further questions. Able to teach back precautions and considerations for L shoulder recovery;  See below for any follow-up Physical Therapy or equipment needs. PT is signing off. Thank you for this referral.        Recommendations for follow up therapy are one component of a multi-disciplinary discharge planning process, led by the attending physician.  Recommendations may be updated based on patient status, additional functional criteria and insurance authorization.  Follow Up Recommendations Follow physician's recommendations for discharge plan and follow up therapies      Assistance Recommended at Discharge PRN  Patient can return home with the following  A little help with bathing/dressing/bathroom    Equipment Recommendations None recommended by PT  Recommendations for Other Services       Functional Status Assessment Patient has had a recent decline in their functional status and demonstrates the ability to make significant improvements in function in a reasonable and predictable amount of time.     Precautions / Restrictions Precautions Precautions: Shoulder Type of Shoulder Precautions: L reverse shoulder arthroplasty. No AROM of shoulder; ext rotation 0-30 OK. Pendulums, hand/wrist/elbow exercises ok. Has sling Shoulder Interventions: Shoulder sling/immobilizer;Off for dressing/bathing/exercises Precaution Booklet Issued: Yes (comment) Precaution Comments: Spanish  shoulder d/c handout provided. Elbow/wrist/hand, pendulums, and sling instructions also provided, but spanish version not available. Pt verbalized and demonstrated understanding of all information provided. Restrictions LUE Weight Bearing: Non weight bearing      Mobility  Bed Mobility Overal bed mobility: Modified Independent                  Transfers Overall transfer level: Independent Equipment used: None Transfers: Sit to/from Stand Sit to Stand: Independent                Ambulation/Gait Ambulation/Gait assistance: Independent Gait Distance (Feet): 400 Feet Assistive device: None Gait Pattern/deviations: WFL(Within Functional Limits)       General Gait Details: No difficutly; sling with good fit and support  Stairs Stairs: Yes Stairs assistance: Independent, Modified independent (Device/Increase time) Stair Management: One rail Right, Alternating pattern, Forwards Number of Stairs: 12 General stair comments: overall no difficulty  Wheelchair Mobility    Modified Rankin (Stroke Patients Only)       Balance Overall balance assessment: No apparent balance deficits (not formally assessed)                                           Pertinent Vitals/Pain Pain Assessment Pain Assessment: Faces Faces Pain Scale: Hurts a little bit Pain Location: L arm Pain Descriptors / Indicators: Operative site guarding, Discomfort Pain Intervention(s): Monitored during session    Home Living Family/patient expects to be discharged to:: Private residence Living Arrangements: Spouse/significant other Available Help at Discharge: Family;Available 24 hours/day Type of Home: House (town house) Home Access: Level entry     Alternate Level Stairs-Number of Steps: 10 Home Layout: Two level Home Equipment: None  Prior Function Prior Level of Function : Independent/Modified Independent             Mobility Comments: no AD ADLs  Comments: Pt reports indpendent     Hand Dominance   Dominant Hand: Right    Extremity/Trunk Assessment   Upper Extremity Assessment Upper Extremity Assessment: Defer to OT evaluation    Lower Extremity Assessment Lower Extremity Assessment: Overall WFL for tasks assessed    Cervical / Trunk Assessment Cervical / Trunk Assessment: Normal  Communication   Communication: Prefers language other than [place];No difficulties (Raquel, Medical Spanish Interpreter, present and facilitated clear communication)  Cognition Arousal/Alertness: Awake/alert Behavior During Therapy: WFL for tasks assessed/performed Overall Cognitive Status: Within Functional Limits for tasks assessed                                 General Comments: Verbalized understanding of all information provided        General Comments General comments (skin integrity, edema, etc.): NAD    Exercises     Assessment/Plan    PT Assessment All further PT needs can be met in the next venue of care  PT Problem List Decreased strength;Decreased range of motion;Decreased activity tolerance;Pain       PT Treatment Interventions      PT Goals (Current goals can be found in the Care Plan section)  Acute Rehab PT Goals Patient Stated Goal: Home today PT Goal Formulation: All assessment and education complete, DC therapy    Frequency       Co-evaluation               AM-PAC PT "6 Clicks" Mobility  Outcome Measure Help needed turning from your back to your side while in a flat bed without using bedrails?: None Help needed moving from lying on your back to sitting on the side of a flat bed without using bedrails?: None Help needed moving to and from a bed to a chair (including a wheelchair)?: None Help needed standing up from a chair using your arms (e.g., wheelchair or bedside chair)?: None Help needed to walk in hospital room?: None Help needed climbing 3-5 steps with a railing? : None 6  Click Score: 24    End of Session Equipment Utilized During Treatment:  (sling) Activity Tolerance: Patient tolerated treatment well Patient left: Other (comment) (managing independently in room) Nurse Communication: Mobility status (ok for dc home) PT Visit Diagnosis: Pain Pain - Right/Left: Left Pain - part of body: Shoulder    Time: 1145-1155 PT Time Calculation (min) (ACUTE ONLY): 10 min   Charges:   PT Evaluation $PT Eval Low Complexity: [place] [id number]   Colletta Maryland 08/31/2022, 1:14 PM

## 2022-09-01 ENCOUNTER — Telehealth: Payer: Self-pay | Admitting: *Deleted

## 2022-09-01 NOTE — Telephone Encounter (Signed)
Ortho bundle call. Spoke with patient's daughter, who states he is doing well. She attempted to take block off home phone so CM could speak with her father but unable to through interpretation. Will try again later.

## 2022-09-06 ENCOUNTER — Telehealth: Payer: Self-pay

## 2022-09-06 ENCOUNTER — Telehealth: Payer: Self-pay | Admitting: *Deleted

## 2022-09-06 NOTE — Telephone Encounter (Signed)
Agree with you  no more recs thx

## 2022-09-06 NOTE — Telephone Encounter (Signed)
Patients daughter called triage stating that her dads shoulder is turning black. I advised her to take a picture and sent it via Mychart since Dr. Marlou Sa is in surgery today. He stated that he is in a lot of pain. She asked if the bandage could be removed I advised to wait until [facility] post op appt with Dr. Marlou Sa.  Patient had a L reverse shoulder arthroplasty on 08/30/22

## 2022-09-06 NOTE — Telephone Encounter (Signed)
Ortho bundle 7 day call completed.

## 2022-09-07 ENCOUNTER — Encounter: Payer: Self-pay | Admitting: Sports Medicine

## 2022-09-07 ENCOUNTER — Encounter: Payer: Self-pay | Admitting: Orthopedic Surgery

## 2022-09-07 ENCOUNTER — Other Ambulatory Visit: Payer: Self-pay | Admitting: Surgical

## 2022-09-07 MED ORDER — OXYCODONE HCL 5 MG PO TABS: 5.0000 mg | ORAL_TABLET | Freq: Four times a day (QID) | ORAL | 0 refills | Status: DC | PRN

## 2022-09-07 NOTE — Telephone Encounter (Signed)
Sent in refill

## 2022-09-11 ENCOUNTER — Other Ambulatory Visit: Payer: Self-pay | Admitting: Sports Medicine

## 2022-09-13 ENCOUNTER — Other Ambulatory Visit: Payer: Self-pay | Admitting: Surgical

## 2022-09-14 ENCOUNTER — Encounter: Payer: Self-pay | Admitting: Orthopedic Surgery

## 2022-09-14 ENCOUNTER — Other Ambulatory Visit: Payer: Self-pay | Admitting: Emergency Medicine

## 2022-09-14 ENCOUNTER — Ambulatory Visit (INDEPENDENT_AMBULATORY_CARE_PROVIDER_SITE_OTHER): Payer: Medicare HMO | Admitting: Orthopedic Surgery

## 2022-09-14 ENCOUNTER — Ambulatory Visit (INDEPENDENT_AMBULATORY_CARE_PROVIDER_SITE_OTHER): Payer: Medicare HMO

## 2022-09-14 DIAGNOSIS — Z96612 Presence of left artificial shoulder joint: Secondary | ICD-10-CM

## 2022-09-14 MED ORDER — GABAPENTIN 300 MG PO CAPS: 300.0000 mg | ORAL_CAPSULE | Freq: Two times a day (BID) | ORAL | 0 refills | Status: DC

## 2022-09-14 MED ORDER — METHOCARBAMOL 500 MG PO TABS: 500.0000 mg | ORAL_TABLET | Freq: Three times a day (TID) | ORAL | 0 refills | Status: DC | PRN

## 2022-09-14 MED ORDER — HYDROCODONE-ACETAMINOPHEN 5-325 MG PO TABS: ORAL_TABLET | ORAL | 0 refills | Status: DC

## 2022-09-14 MED ORDER — MELOXICAM 7.5 MG PO TABS: 7.5000 mg | ORAL_TABLET | Freq: Two times a day (BID) | ORAL | 0 refills | Status: DC | PRN

## 2022-09-14 NOTE — Progress Notes (Signed)
Post-Op Visit Note   Patient: Duane Cuevas           Date of Birth: 05-07-1956           MRN: [id number] Visit Date: 09/14/2022 PCP: [name], MD   Assessment & Plan:  Chief Complaint:  Chief Complaint  Patient presents with   Left Shoulder - Routine Post Op    08/30/22 (2w 1d) Left Reverse Shoulder Arthroplasty      Visit Diagnoses:  1. History of arthroplasty of left shoulder     Plan: Patient presents now 2 weeks out left reverse shoulder replacement.  Reporting some severe pain.  Takes oxycodone and states that that was not helping.  He is in a CPM machine.  On examination the incision is intact.  Has pretty reasonable passive range of motion up to 90 degrees of forward flexion and abduction.  Plan at this time is to refill Norco and Robaxin.  Also add Neurontin.  Add Mobic.  Physical therapy 3 times a week for 4 weeks for range of motion both active assisted range of motion and passive range of motion.  Follow-up in 4 weeks for clinical recheck.  Follow-Up Instructions: No follow-ups on file.   Orders:  Orders Placed This Encounter  Procedures   XR Shoulder Left   Meds ordered this encounter  Medications   HYDROcodone-acetaminophen (NORCO/VICODIN) 5-325 MG tablet    Sig: 1 po q 6hrs prn pain    Dispense:  35 tablet    Refill:  0   gabapentin (NEURONTIN) 300 MG capsule    Sig: Take 1 capsule (300 mg total) by mouth 2 (two) times daily.    Dispense:  60 capsule    Refill:  0   methocarbamol (ROBAXIN) 500 MG tablet    Sig: Take 1 tablet (500 mg total) by mouth every 8 (eight) hours as needed for muscle spasms.    Dispense:  30 tablet    Refill:  0   meloxicam (MOBIC) 7.5 MG tablet    Sig: Take 1 tablet (7.5 mg total) by mouth 2 (two) times daily as needed for pain.    Dispense:  60 tablet    Refill:  0    Imaging: No results found.  PMFS History: Patient Active Problem List   Diagnosis Date Noted   Rotator cuff arthropathy, left  08/31/2022   Biceps tendonitis on left 08/31/2022   Retained orthopedic hardware 08/31/2022   S/P reverse total shoulder arthroplasty, left 08/30/2022   Crushing injury of left shoulder 03/28/2022   COPD exacerbation ([place]) 12/09/2021   Seizure ([place]) 12/09/2021   Sebaceous cyst 08/10/2021   Intractable persistent migraine aura without cerebral infarction and with status migrainosus 05/26/2021   Hypertension associated with diabetes ([place]) 09/21/2020   Uncontrolled type 2 diabetes mellitus with hyperglycemia ([place]) 09/13/2020   CAD (coronary artery disease) 01/23/2020   HLD (hyperlipidemia) 11/02/2019   History of MI (myocardial infarction) 11/02/2019   Cigarette smoker 08/15/2018   COPD ? GOLD III/ active smoker 08/14/2018   History of diet-controlled diabetes 06/30/2018   COPD (chronic obstructive pulmonary disease) ([place]) 10/14/2014   Dyslipidemia associated with type 2 diabetes mellitus ([place]) 10/14/2014   Past Medical History:  Diagnosis Date   Diabetes mellitus ([place]) 10/2014   pt denies being diabetic.    Emphysema of lung ([place])    Emphysema/COPD ([place]) 10/2014   Hepatic steatosis    Thrombocytopenia ([place]) 09/2015   platelets in 120s.     Family  History  Problem Relation Age of Onset   Emphysema Mother    Diabetes Mother    Emphysema Father    Cancer Sister        Stomach cancer   Diabetes Sister    Stomach cancer Sister    Diabetes Brother    Colon cancer Neg Hx    Colon polyps Neg Hx    Esophageal cancer Neg Hx    Rectal cancer Neg Hx     Past Surgical History:  Procedure Laterality Date   BIOPSY  09/16/2020   Procedure: BIOPSY;  Surgeon: Thornton Park, MD;  Location: [facility];  Service: Gastroenterology;;   CHOLECYSTECTOMY N/A 03/20/2022   Procedure: LAPAROSCOPIC CHOLECYSTECTOMY;  Surgeon: Armandina Gemma, MD;  Location: [facility];  Service: General;  Laterality: N/A;   ESOPHAGOGASTRODUODENOSCOPY N/A 09/23/2015   Procedure: ESOPHAGOGASTRODUODENOSCOPY (EGD);   Surgeon: Ladene Artist, MD;  Location: [facility];  Service: Endoscopy;  Laterality: N/A;   ESOPHAGOGASTRODUODENOSCOPY (EGD) WITH PROPOFOL N/A 09/16/2020   Procedure: ESOPHAGOGASTRODUODENOSCOPY (EGD) WITH PROPOFOL;  Surgeon: Thornton Park, MD;  Location: [facility];  Service: Gastroenterology;  Laterality: N/A;   FOOT SURGERY Left 1999   HERNIA REPAIR     INGUINAL HERNIA REPAIR Left 05/15/2018   Procedure: LEFT INGUINAL HERNIA REPAIR WITH MESH;  Surgeon: Erroll Luna, MD;  Location: [place];  Service: General;  Laterality: Left;   INSERTION OF MESH Left 05/15/2018   Procedure: INSERTION OF MESH;  Surgeon: Erroll Luna, MD;  Location: [place];  Service: General;  Laterality: Left;   INTRAOPERATIVE CHOLANGIOGRAM N/A 03/20/2022   Procedure: INTRAOPERATIVE CHOLANGIOGRAM;  Surgeon: Armandina Gemma, MD;  Location: [facility];  Service: General;  Laterality: N/A;   [place]   patient stated no surgery did have fracture from [place] Left 08/30/2022   Procedure: LEFT REVERSE SHOULDER ARTHROPLASTY;  Surgeon: Meredith Pel, MD;  Location: [place];  Service: Orthopedics;  Laterality: Left;   SHOULDER ARTHROSCOPY W/ ROTATOR CUFF REPAIR Left    patient doesn't recall   UPPER GASTROINTESTINAL ENDOSCOPY     Social History   Occupational History   Occupation: Employed    Employer: [employer]  Tobacco Use   Smoking status: Some Days    Packs/day: 0.15    Years: 47.00    Additional pack years: 0.00    Total pack years: 7.05    Types: Cigarettes   Smokeless tobacco: Never  Vaping Use   Vaping Use: Never used  Substance and Sexual Activity   Alcohol use: Yes    Comment: rarely   Drug use: No   Sexual activity: Yes

## 2022-09-15 NOTE — Discharge Summary (Signed)
Physician Discharge Summary      Patient ID: Duane Cuevas MRN: [id number] DOB/AGE: 67-Apr-1957 67 y.o.  Admit date: 08/30/2022 Discharge date: 08/31/2022  Admission Diagnoses:  Principal Problem:   S/P reverse total shoulder arthroplasty, left Active Problems:   Rotator cuff arthropathy, left   Biceps tendonitis on left   Retained orthopedic hardware   Discharge Diagnoses:  Same  Surgeries: Procedure(s): LEFT REVERSE SHOULDER ARTHROPLASTY on 08/30/2022   Consultants:   Discharged Condition: Stable  Hospital Course: Duane Cuevas is an 67 y.o. male who was admitted 08/30/2022 with a chief complaint of left shoulder pain, and found to have a diagnosis of left shoulder rotator cuff arthropathy.  They were brought to the operating room on 08/30/2022 and underwent the above named procedures.  Pt awoke from anesthesia without complication and was transferred to the floor. On POD1, patient's pain was controlled.  He had no chest pain, shortness of breath.  He is able to ambulate around his room without difficulty.  Worked well with occupational therapy.  He was discharged home on POD 1..  Pt will f/u with Dr. Marlou Sa in clinic in ~2 weeks.   Antibiotics given:  Anti-infectives (From admission, onward)    Start     Dose/Rate Route Frequency Ordered Stop   08/31/22 0830  vancomycin (VANCOCIN) IVPB 1000 mg/200 mL premix        1,000 mg 200 mL/hr over 60 Minutes Intravenous To Short Stay 08/31/22 0737 08/31/22 0909   08/30/22 1900  ceFAZolin (ANCEF) IVPB 2g/100 mL premix  Status:  Discontinued        2 g 200 mL/hr over 30 Minutes Intravenous Every 8 hours 08/30/22 1726 08/31/22 1733   08/30/22 1259  vancomycin (VANCOCIN) powder  Status:  Discontinued          As needed 08/30/22 1259 08/30/22 1438     .  Recent vital signs:  Vitals:   08/31/22 0805 08/31/22 0900  BP: 107/65   Pulse: 89   Resp: 18   Temp:  98.1 F (36.7 C)  SpO2: 99%     Recent laboratory studies:   Results for orders placed or performed during the hospital encounter of 08/30/22  Glucose, capillary  Result Value Ref Range   Glucose-Capillary 205 (H) 70 - 99 mg/dL  Glucose, capillary  Result Value Ref Range   Glucose-Capillary 92 70 - 99 mg/dL  Glucose, capillary  Result Value Ref Range   Glucose-Capillary 141 (H) 70 - 99 mg/dL  Glucose, capillary  Result Value Ref Range   Glucose-Capillary 158 (H) 70 - 99 mg/dL  Glucose, capillary  Result Value Ref Range   Glucose-Capillary 232 (H) 70 - 99 mg/dL  Glucose, capillary  Result Value Ref Range   Glucose-Capillary 193 (H) 70 - 99 mg/dL  Glucose, capillary  Result Value Ref Range   Glucose-Capillary 212 (H) 70 - 99 mg/dL    Discharge Medications:   Allergies as of 08/31/2022   No Known Allergies      Medication List     STOP taking these medications    cyclobenzaprine 10 MG tablet Commonly known as: FLEXERIL   methocarbamol 500 MG tablet Commonly known as: ROBAXIN   oxyCODONE 5 MG immediate release tablet Commonly known as: Oxy IR/ROXICODONE       TAKE these medications    Accu-Chek Guide test strip Generic drug: glucose blood Use as instructed   albuterol 108 (90 Base) MCG/ACT inhaler Commonly known as: VENTOLIN HFA Inhale 2 puffs into the  lungs every 6 (six) hours as needed for wheezing or shortness of breath.   albuterol (2.5 MG/3ML) 0.083% nebulizer solution Commonly known as: PROVENTIL USE 1 VIAL VIA NEBULIZER EVERY 6 HOURS AS NEEDED FOR WHEEZING OR SHORTNESS OF BREATH   amLODipine 5 MG tablet Commonly known as: NORVASC Take 1 tablet (5 mg total) by mouth daily. TOMAR 1 TABLETA POR VAI ORAL UNA VEZ AL DIA   aspirin EC 81 MG tablet Take 1 tablet (81 mg total) by mouth daily. Swallow whole.   atorvastatin 20 MG tablet Commonly known as: LIPITOR TOMAR 1 TABLETA POR VIA ORAL UNA VEZ AL DIA   B-D ULTRAFINE III SHORT PEN 31G X 8 MM Misc Generic drug: Insulin Pen Needle USAR PARA INYECTAR  INSULINA   B-D ULTRAFINE III SHORT PEN 31G X 8 MM Misc Generic drug: Insulin Pen Needle USAR PARA INYECTAR INSULINA   benzonatate 100 MG capsule Commonly known as: Tessalon Perles Take 1 capsule (100 mg total) by mouth 3 (three) times daily as needed for cough.   blood glucose meter kit and supplies Dispense based on patient and insurance preference. Use up to four times daily as directed. (FOR ICD-10 E10.9, E11.9).   blood glucose meter kit and supplies Kit Dispense based on patient and insurance preference. Use up to four times daily as directed.   Breztri Aerosphere 160-9-4.8 MCG/ACT Aero Generic drug: Budeson-Glycopyrrol-Formoterol INHALAR 2 BOCANADAS POR VIA ORAL DOS VECES AL DIA   Farxiga 10 MG Tabs tablet Generic drug: dapagliflozin propanediol Take 1 tablet (10 mg total) by mouth daily.   NovoLOG FlexPen 100 UNIT/ML FlexPen Generic drug: insulin aspart INYECTAR 3 UNIDADES POR VIA SUBCUTANEA TRES VECES AL DIA COMO INDICADO. AJUSTAR CANTIDAD DE INSULINA POR ESCALA MOVIL. MAS DOSIS 30 UNIDADES What changed:  how much to take how to take this when to take this additional instructions   pantoprazole 40 MG tablet Commonly known as: PROTONIX TOMAR 1 TABLETA POR VIA ORAL UNA VEZ AL DIA   Tresiba FlexTouch 100 UNIT/ML FlexTouch Pen Generic drug: insulin degludec INYECTAR 25 UNIDADES POR VIA SUBCUTANEA DIARIAMENTE   Trulicity 1.5 0000000 Sopn Generic drug: Dulaglutide INYECTAR CONTENIDOS DE UN LAPIZ POR VIA SUBCUTANEA CADA SEMANA, EL MISMO DIA CADA SEMANA   Ubrelvy 100 MG Tabs Generic drug: Ubrogepant TOMAR 1 TALBETA POR VIA ORAL UNA VEZ AL DIA        Diagnostic Studies: XR Shoulder Left  Result Date: 09/14/2022 AP axillary outlet radiographs left shoulder reviewed.  Reverse shoulder prosthesis in good position alignment with no complicating features  DG Shoulder Left Port  Result Date: 08/30/2022 CLINICAL DATA:  Status post shoulder replacement EXAM: LEFT  SHOULDER COMPARISON:  Left shoulder x-ray 03/28/2022 FINDINGS: There is a new left shoulder arthroplasty in anatomic alignment. No evidence for fracture. There is soft tissue air and swelling lateral to the arthroplasty compatible with recent surgery. IMPRESSION: Left shoulder arthroplasty in anatomic alignment. Electronically Signed   By: Ronney Asters M.D.   On: 08/30/2022 15:21    Disposition: Discharge disposition: 01-Home or Self Care       Discharge Instructions     Call MD / Call 911   Complete by: As directed    If you experience chest pain or shortness of breath, CALL 911 and be transported to the hospital emergency room.  If you develope a fever above 101 F, pus (white drainage) or increased drainage or redness at the wound, or calf pain, call your surgeon's office.   Constipation Prevention  Complete by: As directed    Drink plenty of fluids.  Prune juice may be helpful.  You may use a stool softener, such as Colace (over the counter) 100 mg twice a day.  Use MiraLax (over the counter) for constipation as needed.   Diet - low sodium heart healthy   Complete by: As directed    Discharge instructions   Complete by: As directed    You may shower, dressing is waterproof.  Do not bathe or soak the operative shoulder in a tub, pool.  Use the CPM machine 3 times a day for one hour each time.  No lifting with the operative shoulder. Continue use of the sling.  Follow-up with Dr. Marlou Sa or Rica Mote in ~2 weeks on your given appointment date.  We will remove your adhesive bandage at that time.    Dental Antibiotics:  In most cases prophylactic antibiotics for Dental procdeures after total joint surgery are not necessary.  Exceptions are as follows:  1. History of prior total joint infection  2. Severely immunocompromised (Organ Transplant, cancer chemotherapy, Rheumatoid biologic meds such as Henefer)  3. Poorly controlled diabetes (A1C &gt; 8.0, blood glucose over 200)  If you  have one of these conditions, contact your surgeon for an antibiotic prescription, prior to your dental procedure.   Increase activity slowly as tolerated   Complete by: As directed    Post-operative opioid taper instructions:   Complete by: As directed    POST-OPERATIVE OPIOID TAPER INSTRUCTIONS: It is important to wean off of your opioid medication as soon as possible. If you do not need pain medication after your surgery it is ok to stop day one. Opioids include: Codeine, Hydrocodone(Norco, Vicodin), Oxycodone(Percocet, oxycontin) and hydromorphone amongst others.  Long term and even short term use of opiods can cause: Increased pain response Dependence Constipation Depression Respiratory depression And more.  Withdrawal symptoms can include Flu like symptoms Nausea, vomiting And more Techniques to manage these symptoms Hydrate well Eat regular healthy meals Stay active Use relaxation techniques(deep breathing, meditating, yoga) Do Not substitute Alcohol to help with tapering If you have been on opioids for less than two weeks and do not have pain than it is ok to stop all together.  Plan to wean off of opioids This plan should start within one week post op of your joint replacement. Maintain the same interval or time between taking each dose and first decrease the dose.  Cut the total daily intake of opioids by one tablet each day Next start to increase the time between doses. The last dose that should be eliminated is the evening dose.             SignedAnnie Main 09/15/2022, 10:56 AM

## 2022-09-16 ENCOUNTER — Telehealth: Payer: Self-pay

## 2022-09-16 ENCOUNTER — Other Ambulatory Visit: Payer: Self-pay | Admitting: *Deleted

## 2022-09-16 DIAGNOSIS — Z96612 Presence of left artificial shoulder joint: Secondary | ICD-10-CM

## 2022-09-16 NOTE — Telephone Encounter (Signed)
Please make sure appt scheduled per [place] note.

## 2022-09-16 NOTE — Telephone Encounter (Signed)
-----   Message from Meredith Pel, MD sent at 09/16/2022  7:10 AM EDT ----- Corrin Parker can you have Glenard Haring follow-up with [facility] in 4 weeks.  Thanks

## 2022-09-21 ENCOUNTER — Encounter: Payer: Self-pay | Admitting: Emergency Medicine

## 2022-09-21 ENCOUNTER — Ambulatory Visit (INDEPENDENT_AMBULATORY_CARE_PROVIDER_SITE_OTHER): Payer: Medicare HMO | Admitting: Emergency Medicine

## 2022-09-21 ENCOUNTER — Other Ambulatory Visit: Payer: Self-pay

## 2022-09-21 ENCOUNTER — Ambulatory Visit: Payer: Medicare HMO | Attending: Orthopedic Surgery | Admitting: Physical Therapy

## 2022-09-21 VITALS — BP 118/84 | HR 95 | Temp 98.6°F | Ht 68.0 in | Wt 156.5 lb

## 2022-09-21 DIAGNOSIS — J439 Emphysema, unspecified: Secondary | ICD-10-CM

## 2022-09-21 DIAGNOSIS — I152 Hypertension secondary to endocrine disorders: Secondary | ICD-10-CM | POA: Diagnosis not present

## 2022-09-21 DIAGNOSIS — Z96612 Presence of left artificial shoulder joint: Secondary | ICD-10-CM | POA: Insufficient documentation

## 2022-09-21 DIAGNOSIS — M6281 Muscle weakness (generalized): Secondary | ICD-10-CM | POA: Diagnosis present

## 2022-09-21 DIAGNOSIS — M25512 Pain in left shoulder: Secondary | ICD-10-CM | POA: Diagnosis present

## 2022-09-21 DIAGNOSIS — R600 Localized edema: Secondary | ICD-10-CM | POA: Insufficient documentation

## 2022-09-21 DIAGNOSIS — E1159 Type 2 diabetes mellitus with other circulatory complications: Secondary | ICD-10-CM

## 2022-09-21 DIAGNOSIS — Z23 Encounter for immunization: Secondary | ICD-10-CM

## 2022-09-21 DIAGNOSIS — E1169 Type 2 diabetes mellitus with other specified complication: Secondary | ICD-10-CM

## 2022-09-21 DIAGNOSIS — I2510 Atherosclerotic heart disease of native coronary artery without angina pectoris: Secondary | ICD-10-CM

## 2022-09-21 DIAGNOSIS — R110 Nausea: Secondary | ICD-10-CM | POA: Insufficient documentation

## 2022-09-21 DIAGNOSIS — E785 Hyperlipidemia, unspecified: Secondary | ICD-10-CM | POA: Diagnosis not present

## 2022-09-21 DIAGNOSIS — T887XXA Unspecified adverse effect of drug or medicament, initial encounter: Secondary | ICD-10-CM | POA: Diagnosis not present

## 2022-09-21 LAB — CBC WITH DIFFERENTIAL/PLATELET
Basophils Absolute: 0.1 10*3/uL (ref 0.0–0.1)
Basophils Relative: 1.4 % (ref 0.0–3.0)
Eosinophils Absolute: 0.2 10*3/uL (ref 0.0–0.7)
Eosinophils Relative: 1.8 % (ref 0.0–5.0)
HCT: 47.4 % (ref 39.0–52.0)
Hemoglobin: 15.9 g/dL (ref 13.0–17.0)
Lymphocytes Relative: 16.6 % (ref 12.0–46.0)
Lymphs Abs: 1.4 10*3/uL (ref 0.7–4.0)
MCHC: 33.5 g/dL (ref 30.0–36.0)
MCV: 92.6 fl (ref 78.0–100.0)
Monocytes Absolute: 0.6 10*3/uL (ref 0.1–1.0)
Monocytes Relative: 7.3 % (ref 3.0–12.0)
Neutro Abs: 6.3 10*3/uL (ref 1.4–7.7)
Neutrophils Relative %: 72.9 % (ref 43.0–77.0)
Platelets: 333 10*3/uL (ref 150.0–400.0)
RBC: 5.12 Mil/uL (ref 4.22–5.81)
RDW: 13.5 % (ref 11.5–15.5)
WBC: 8.7 10*3/uL (ref 4.0–10.5)

## 2022-09-21 LAB — COMPREHENSIVE METABOLIC PANEL
ALT: 17 U/L (ref 0–53)
AST: 15 U/L (ref 0–37)
Albumin: 4.2 g/dL (ref 3.5–5.2)
Alkaline Phosphatase: 93 U/L (ref 39–117)
BUN: 15 mg/dL (ref 6–23)
CO2: 30 mEq/L (ref 19–32)
Calcium: 10 mg/dL (ref 8.4–10.5)
Chloride: 98 mEq/L (ref 96–112)
Creatinine, Ser: 0.79 mg/dL (ref 0.40–1.50)
GFR: 92.2 mL/min (ref >60.00)
Glucose, Bld: 125 mg/dL — ABNORMAL HIGH (ref 70–99)
Potassium: 4.2 mEq/L (ref 3.5–5.1)
Sodium: 136 mEq/L (ref 135–145)
Total Bilirubin: 0.8 mg/dL (ref 0.2–1.2)
Total Protein: 7.8 g/dL (ref 6.0–8.3)

## 2022-09-21 MED ORDER — ONDANSETRON 4 MG PO TBDP: 4.0000 mg | ORAL_TABLET | Freq: Three times a day (TID) | ORAL | 2 refills | Status: DC | PRN

## 2022-09-21 NOTE — Assessment & Plan Note (Signed)
Chronic stable condition Continue atorvastatin 20 mg daily Diet and nutrition discussed

## 2022-09-21 NOTE — Assessment & Plan Note (Addendum)
Side effects secondary to opiates Advised to stop opiates Pain management discussed

## 2022-09-21 NOTE — Therapy (Signed)
OUTPATIENT PHYSICAL THERAPY SHOULDER EVALUATION   Patient Name: Duane Cuevas MRN: [id number] DOB:April 03, 1956, 67 y.o., male Today's Date: 09/21/2022   PT End of Session - 09/21/22 1113     Visit Number 1    Number of Visits --   1-3x/week   Date for PT Re-Evaluation 11/16/22    Authorization Type [employer] - FOTO    PT Start Time 1045    PT Stop Time 1120    PT Time Calculation (min) 35 min             Past Medical History:  Diagnosis Date   Diabetes mellitus ([place]) 10/2014   pt denies being diabetic.    Emphysema of lung ([place])    Emphysema/COPD ([place]) 10/2014   Hepatic steatosis    Thrombocytopenia ([place]) 09/2015   platelets in 120s.    Past Surgical History:  Procedure Laterality Date   BIOPSY  09/16/2020   Procedure: BIOPSY;  Surgeon: Thornton Park, MD;  Location: [facility];  Service: Gastroenterology;;   CHOLECYSTECTOMY N/A 03/20/2022   Procedure: LAPAROSCOPIC CHOLECYSTECTOMY;  Surgeon: Armandina Gemma, MD;  Location: [facility];  Service: General;  Laterality: N/A;   ESOPHAGOGASTRODUODENOSCOPY N/A 09/23/2015   Procedure: ESOPHAGOGASTRODUODENOSCOPY (EGD);  Surgeon: Ladene Artist, MD;  Location: [facility];  Service: Endoscopy;  Laterality: N/A;   ESOPHAGOGASTRODUODENOSCOPY (EGD) WITH PROPOFOL N/A 09/16/2020   Procedure: ESOPHAGOGASTRODUODENOSCOPY (EGD) WITH PROPOFOL;  Surgeon: Thornton Park, MD;  Location: [facility];  Service: Gastroenterology;  Laterality: N/A;   FOOT SURGERY Left 1999   HERNIA REPAIR     INGUINAL HERNIA REPAIR Left 05/15/2018   Procedure: LEFT INGUINAL HERNIA REPAIR WITH MESH;  Surgeon: Erroll Luna, MD;  Location: [facility];  Service: General;  Laterality: Left;   INSERTION OF MESH Left 05/15/2018   Procedure: INSERTION OF MESH;  Surgeon: Erroll Luna, MD;  Location: [facility];  Service: General;  Laterality: Left;   INTRAOPERATIVE CHOLANGIOGRAM N/A 03/20/2022   Procedure: INTRAOPERATIVE CHOLANGIOGRAM;  Surgeon: Armandina Gemma, MD;   Location: [facility];  Service: General;  Laterality: N/A;   [place]   patient stated no surgery did have fracture from [place] Left 08/30/2022   Procedure: LEFT REVERSE SHOULDER ARTHROPLASTY;  Surgeon: Meredith Pel, MD;  Location: [facility];  Service: Orthopedics;  Laterality: Left;   SHOULDER ARTHROSCOPY W/ ROTATOR CUFF REPAIR Left    patient doesn't recall   UPPER GASTROINTESTINAL ENDOSCOPY     Patient Active Problem List   Diagnosis Date Noted   Rotator cuff arthropathy, left 08/31/2022   Biceps tendonitis on left 08/31/2022   Retained orthopedic hardware 08/31/2022   S/P reverse total shoulder arthroplasty, left 08/30/2022   Crushing injury of left shoulder 03/28/2022   COPD exacerbation ([place]) 12/09/2021   Seizure ([place]) 12/09/2021   Sebaceous cyst 08/10/2021   Intractable persistent migraine aura without cerebral infarction and with status migrainosus 05/26/2021   Hypertension associated with diabetes ([place]) 09/21/2020   Uncontrolled type 2 diabetes mellitus with hyperglycemia ([place]) 09/13/2020   CAD (coronary artery disease) 01/23/2020   HLD (hyperlipidemia) 11/02/2019   History of MI (myocardial infarction) 11/02/2019   Cigarette smoker 08/15/2018   COPD ? GOLD III/ active smoker 08/14/2018   History of diet-controlled diabetes 06/30/2018   COPD (chronic obstructive pulmonary disease) ([place]) 10/14/2014   Dyslipidemia associated with type 2 diabetes mellitus ([place]) 10/14/2014    PCP: [name], MD  REFERRING PROVIDER: Meredith Pel, MD  THERAPY DIAG:  Left  shoulder pain, unspecified chronicity  Muscle weakness  REFERRING DIAG: L RTSA with subscap repair  Rationale for Evaluation and Treatment:  Rehabilitation  SUBJECTIVE:  PERTINENT PAST HISTORY:  History of MI (denies), COPD, DM TII, Seizure (denies)        PRECAUTIONS: RTSA 2/27 with subscap repair  OP Date: 08/30/2022  2 weeks 09/13/2022  4  weeks 09/27/2022  6 weeks 10/11/2022  8 weeks 10/25/2022  10 weeks 11/08/2022  12 weeks 11/22/2022    WEIGHT BEARING RESTRICTIONS Yes RTSA 2/27  FALLS:  Has patient fallen in last 6 months? Yes, Number of falls: Which led to his surgery  MOI/History of condition:  Onset date: 2/27  SUBJECTIVE STATEMENT  Duane Cuevas is a 67 y.o. male who presents to clinic with chief complaint of L shoulder pain following RTSA on 2/27.  At 2 week follow up sling was D/C'd by MD.    From referring provider:   "Plan: Patient presents now 2 weeks out left reverse shoulder replacement.  Reporting some severe pain.  Takes oxycodone and states that that was not helping.  He is in a CPM machine.  On examination the incision is intact.  Has pretty reasonable passive range of motion up to 90 degrees of forward flexion and abduction.  Plan at this time is to refill Norco and Robaxin.  Also add Neurontin.  Add Mobic.  Physical therapy 3 times a week for 4 weeks for range of motion both active assisted range of motion and passive range of motion.  Follow-up in 4 weeks for clinical recheck. "   Red flags:  denies   Pain:  Are you having pain? Yes Pain location: L shoulder NPRS scale:  6/10 to 10/10 Aggravating factors: arm movements, sleeping Relieving factors: pain meds, rest Pain description: constant, sharp, and aching Stage: Subacute Stability: getting better 24 hour pattern: worse at night   Occupation: none  Assistive Device: none  Hand Dominance: R  Patient Goals/Specific Activities: reduce pain   OBJECTIVE:   GENERAL OBSERVATION:  Pt enters clinic with no sling     SENSATION:  Light touch: Appears intact on exam   PALPATION: Diffuse TTP L shoulder  UPPER EXTREMITY PROM:  ROM Right 09/21/2022 Left 09/21/2022  Shoulder flexion WFL 100  Shoulder abduction WFL 60  Shoulder internal rotation    Shoulder external rotation  30 degrees (empty)  Functional IR    Functional ER     Shoulder extension    Elbow extension    Elbow flexion     (Blank rows = not tested, N = WNL, * = concordant pain with testing)  UPPER EXTREMITY MMT:  MMT Right 09/21/2022 Left 09/21/2022  Shoulder flexion    Shoulder abduction (C5)    Shoulder ER    Shoulder IR    Middle trapezius    Lower trapezius    Shoulder extension    Grip strength    Cervical flexion (C1,C2)    Cervical S/B (C3)    Shoulder shrug (C4)    Elbow flexion (C6)    Elbow ext (C7)    Thumb ext (C8)    Finger abd (T1)    Grossly     (Blank rows = not tested, score listed is out of 5 possible points.  N = WNL, D = diminished, C = clear for gross weakness with myotome testing, * = concordant pain with testing)  PATIENT SURVEYS:  FOTO 39 -> 65    TODAY'S TREATMENT:  Creating, reviewing,  and completing below HEP   PATIENT EDUCATION:  POC, diagnosis, prognosis, HEP, and outcome measures.  Pt educated via explanation, demonstration, and handout (HEP).  Pt confirms understanding verbally.   HOME EXERCISE PROGRAM: Access Code: [email or web address] URL: [email or web address] Date: 09/21/2022 Prepared by: Shearon Balo  Exercises - Seated Scapular Retraction  - 3 x daily - 7 x weekly - 3 sets - 10 reps - Seated Shoulder Flexion Towel Slide at Table Top  - 3 x daily - 7 x weekly - 2 sets - 10 reps - Seated Elbow Flexion and Extension AROM  - 3 x daily - 7 x weekly - 3 sets - 10 reps  Treatment priorities   Eval (09/21/2022)        Gentle PROM -> AAROM                                          ASSESSMENT:  CLINICAL IMPRESSION: Duane Cuevas is a 67 y.o. male who presents to clinic with signs and sxs consistent with L RTSA with sub scap repair on 2/27.    OBJECTIVE IMPAIRMENTS: Pain, shoulder AROM and PROM  ACTIVITY LIMITATIONS: lifting, reaching, lifting  PERSONAL FACTORS: See medical history and pertinent history   REHAB POTENTIAL: Good  CLINICAL DECISION MAKING:  Stable/uncomplicated  EVALUATION COMPLEXITY: Low   GOALS:   SHORT TERM GOALS: Target date: 10/19/2022  Duane Cuevas will be >75% HEP compliant to improve carryover between sessions and facilitate independent management of condition  Evaluation (09/21/2022): ongoing Goal status: INITIAL   LONG TERM GOALS: Target date: 11/16/2022  Duane Cuevas will improve FOTO score to 39 as a proxy for functional improvement  Evaluation/Baseline (09/21/2022): 65 Goal status: INITIAL   2.  Duane Cuevas will self report >/= 50% decrease in pain from evaluation   Evaluation/Baseline (09/21/2022): 10/10 max pain Goal status: INITIAL   3.  Duane Cuevas will demonstrate >120 degrees of active ROM in flexion to allow completion of activities involving reaching OH, not limited by pain  Evaluation/Baseline (09/21/2022): 100 degrees passive ROM Goal status: INITIAL   4.  Duane Cuevas will be compliant with post op precautions throughout therapy.  Evaluation/Baseline (09/21/2022): precautions reviewed Goal status: INITIAL   5.  Duane Cuevas will be able to reach to cabinet repeatedly with at least 3#, not limited by pain  Evaluation/Baseline (09/21/2022): limited Goal status: INITIAL    PLAN: PT FREQUENCY: 1-2x/week  PT DURATION: 8 weeks (Ending 11/16/2022)  PLANNED INTERVENTIONS: Therapeutic exercises, Aquatic therapy, Therapeutic activity, Neuro Muscular re-education, Gait training, Patient/Family education, Joint mobilization, Dry Needling, Electrical stimulation, Spinal mobilization and/or manipulation, Moist heat, Taping, Vasopneumatic device, Ionotophoresis 4mg /ml Dexamethasone, and Manual therapy    Shearon Balo PT, DPT 09/21/2022, 11:24 AM

## 2022-09-21 NOTE — Progress Notes (Signed)
Duane Cuevas 67 y.o.   Chief Complaint  Patient presents with   Medical Management of Chronic Issues    F/u DM, wants to discuss medications     HISTORY OF PRESENT ILLNESS: This is a 67 y.o. male here for follow-up of chronic medical conditions. Status post left shoulder surgery on 08/30/2022.  Just started physical therapy.  Doing well.  Still taking occasional opiates. Getting occasional episodes of nausea mostly before eating. History of well-controlled COPD History of diabetes on multiple medications. Lab Results  Component Value Date   HGBA1C 7.1 (H) 08/25/2022   No other complaints or medical concerns today.  HPI   Prior to Admission medications   Medication Sig Start Date End Date Taking? Authorizing Provider  albuterol (PROVENTIL) (2.5 MG/3ML) 0.083% nebulizer solution USE 1 VIAL VIA NEBULIZER EVERY 6 HOURS AS NEEDED FOR WHEEZING OR SHORTNESS OF BREATH 05/24/21  Yes Nikolaos Maddocks, Ines Bloomer, MD  albuterol (VENTOLIN HFA) 108 (90 Base) MCG/ACT inhaler Inhale 2 puffs into the lungs every 6 (six) hours as needed for wheezing or shortness of breath. 04/15/21  Yes Alyxis Grippi, Ines Bloomer, MD  amLODipine (NORVASC) 5 MG tablet Take 1 tablet (5 mg total) by mouth daily. TOMAR 1 TABLETA POR VAI ORAL UNA VEZ AL DIA 07/11/22  Yes Lamorris Knoblock, Ines Bloomer, MD  aspirin EC 81 MG tablet Take 1 tablet (81 mg total) by mouth daily. Swallow whole. 09/01/22  Yes Magnant, Charles L, PA-C  atorvastatin (LIPITOR) 20 MG tablet TOMAR 1 TABLETA POR VIA ORAL UNA VEZ AL DIA 08/01/22  Yes Roslyn Else, Ines Bloomer, MD  blood glucose meter kit and supplies KIT Dispense based on patient and insurance preference. Use up to four times daily as directed. 10/22/20  Yes Marylee Belzer, Ines Bloomer, MD  blood glucose meter kit and supplies Dispense based on patient and insurance preference. Use up to four times daily as directed. (FOR ICD-10 E10.9, E11.9). 10/24/20  Yes [name], MD  Budeson-Glycopyrrol-Formoterol  (BREZTRI AEROSPHERE) 160-9-4.8 MCG/ACT AERO INHALAR 2 BOCANADAS POR VIA ORAL DOS VECES AL DIA 06/10/22  Yes Jaimarie Rapozo, Pepper Pike, MD  FARXIGA 10 MG TABS tablet Take 1 tablet (10 mg total) by mouth daily. 07/11/22  Yes Bomani Oommen, Ines Bloomer, MD  gabapentin (NEURONTIN) 300 MG capsule Take 1 capsule (300 mg total) by mouth 2 (two) times daily. 09/14/22  Yes Meredith Pel, MD  glipiZIDE (GLUCOTROL) 10 MG tablet TOMAR 1 TABLETA POR VIA ORAL UNA VEZ AL DIA ANTES DEL DESAYUNO *TOMAR 1 TABLETA ADICIONAL SI AZUCAR EN LA SANGRE ESTA ALTA* 09/14/22  Yes Zanae Kuehnle, Ines Bloomer, MD  glucose blood (ACCU-CHEK GUIDE) test strip Use as instructed 12/22/21  Yes Saliha Salts, Ines Bloomer, MD  HYDROcodone-acetaminophen (NORCO/VICODIN) 5-325 MG tablet 1 po q 6hrs prn pain 09/14/22  Yes Meredith Pel, MD  insulin aspart (NOVOLOG FLEXPEN) 100 UNIT/ML FlexPen INYECTAR 3 UNIDADES POR VIA SUBCUTANEA TRES VECES AL DIA COMO INDICADO. AJUSTAR CANTIDAD DE INSULINA POR ESCALA MOVIL. MAS DOSIS 30 UNIDADES Patient taking differently: Inject 30 Units into the skin daily. 07/12/22  Yes Jaelah Hauth, Ines Bloomer, MD  Insulin Pen Needle (B-D ULTRAFINE III SHORT PEN) 31G X 8 MM MISC USAR PARA INYECTAR INSULINA 10/13/21  Yes Triva Hueber, Ines Bloomer, MD  Insulin Pen Needle (B-D ULTRAFINE III SHORT PEN) 31G X 8 MM MISC USAR PARA INYECTAR INSULINA 10/13/21  Yes Darleny Sem, Ines Bloomer, MD  meloxicam (MOBIC) 7.5 MG tablet Take 1 tablet (7.5 mg total) by mouth 2 (two) times daily as needed for pain. 09/14/22  Yes Meredith Pel, MD  methocarbamol (ROBAXIN) 500 MG tablet TAKE ONE TABLET BY MOUTH EVERY 8 HOURS AS NEEDED FOR MUSCLE SPASMS 09/14/22  Yes Magnant, Charles L, PA-C  methocarbamol (ROBAXIN) 500 MG tablet Take 1 tablet (500 mg total) by mouth every 8 (eight) hours as needed for muscle spasms. 09/14/22  Yes Meredith Pel, MD  ondansetron (ZOFRAN-ODT) 4 MG disintegrating tablet Take 1 tablet (4 mg total) by mouth every 8 (eight) hours as needed  for nausea or vomiting. 09/21/22  Yes Ramie Erman, Ines Bloomer, MD  oxyCODONE (OXY IR/ROXICODONE) 5 MG immediate release tablet Take 1 tablet (5 mg total) by mouth every 6 (six) hours as needed for breakthrough pain. 09/07/22  Yes Magnant, Charles L, PA-C  pantoprazole (PROTONIX) 40 MG tablet TOMAR 1 TABLETA POR VIA ORAL UNA VEZ AL DIA 01/24/22  Yes Myisha Pickerel, Ines Bloomer, MD  TRULICITY 1.5 0000000 SOPN INYECTAR CONTENIDOS DE UN [place] VIA SUBCUTANEA CADA [place], EL [place] DIA CADA [facility] 06/10/22  Yes [name], MD  Ubrogepant (UBRELVY) 100 MG TABS TOMAR 1 TALBETA POR VIA ORAL UNA VEZ AL DIA 07/11/22  Yes [name], MD  TRESIBA FLEXTOUCH 100 UNIT/ML FlexTouch Pen INYECTAR 25 UNIDADES POR VIA SUBCUTANEA DIARIAMENTE Patient not taking: Reported on 07/12/2022 04/08/22   [name], MD    No Known Allergies  Patient Active Problem List   Diagnosis Date Noted   Rotator cuff arthropathy, left 08/31/2022   Biceps tendonitis on left 08/31/2022   Retained orthopedic hardware 08/31/2022   S/P reverse total shoulder arthroplasty, 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 GOLD III/ active smoker 08/14/2018   History of diet-controlled diabetes 06/30/2018   COPD (chronic obstructive pulmonary disease) ([place]) 10/14/2014   Dyslipidemia associated with type 2 diabetes mellitus ([place]) 10/14/2014    Past Medical History:  Diagnosis Date   Diabetes mellitus ([place])  10/2014   pt denies being diabetic.    Emphysema of lung ([place])    Emphysema/COPD ([place]) 10/2014   Hepatic steatosis    Thrombocytopenia ([place]) 09/2015   platelets in 120s.     Past Surgical History:  Procedure Laterality Date   BIOPSY  09/16/2020   Procedure: BIOPSY;  Surgeon: Thornton Park, MD;  [facility];  Service: Gastroenterology;;   CHOLECYSTECTOMY N/A 03/20/2022   Procedure: LAPAROSCOPIC CHOLECYSTECTOMY;  Surgeon: Armandina Gemma, MD;  Location: [facility];  Service: General;  Laterality: N/A;   ESOPHAGOGASTRODUODENOSCOPY N/A 09/23/2015   Procedure: ESOPHAGOGASTRODUODENOSCOPY (EGD);  Surgeon: Ladene Artist, MD;  Location: [facility];  Service: Endoscopy;  Laterality: N/A;   ESOPHAGOGASTRODUODENOSCOPY (EGD) WITH PROPOFOL N/A 09/16/2020   Procedure: ESOPHAGOGASTRODUODENOSCOPY (EGD) WITH PROPOFOL;  Surgeon: Thornton Park, MD;  Location: [facility];  Service: Gastroenterology;  Laterality: N/A;   FOOT SURGERY Left 1999   HERNIA REPAIR     INGUINAL HERNIA REPAIR Left 05/15/2018   Procedure: LEFT INGUINAL HERNIA REPAIR WITH MESH;  Surgeon: Erroll Luna, MD;  Location: [facility];  Service: General;  Laterality: Left;   INSERTION OF  MESH Left 05/15/2018   Procedure: INSERTION OF MESH;  Surgeon: Erroll Luna, MD;  Location: [facility];  Service: General;  Laterality: Left;   INTRAOPERATIVE CHOLANGIOGRAM N/A 03/20/2022   Procedure: INTRAOPERATIVE CHOLANGIOGRAM;  Surgeon: Armandina Gemma, MD;  Location: [facility];  Service: General;  Laterality: N/A;   [place]   patient stated no surgery did have fracture from [place] Left 08/30/2022   Procedure: LEFT REVERSE SHOULDER ARTHROPLASTY;  Surgeon: Meredith Pel, MD;  Location: [facility];  Service: Orthopedics;  Laterality: Left;   SHOULDER ARTHROSCOPY W/ ROTATOR CUFF REPAIR Left    patient doesn't recall   UPPER GASTROINTESTINAL ENDOSCOPY      Social History   Socioeconomic  History   Marital status: Significant Other    Spouse name: Zoraida   Number of children: 6   Years of education: Not on file   Highest education level: Not on file  Occupational History   Occupation: Employed    Employer: [employer]  Tobacco Use   Smoking status: Some Days    Packs/day: 0.15    Years: 47.00    Additional pack years: 0.00    Total pack years: 7.05    Types: Cigarettes   Smokeless tobacco: Never  Vaping Use   Vaping Use: Never used  Substance and Sexual Activity   Alcohol use: Yes    Comment: rarely   Drug use: No   Sexual activity: Yes  Other Topics Concern   Not on file  Social History Narrative   Lives with wife.  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   Social Determinants of Health   Financial Resource Strain: High Risk (01/14/2021)   Overall Financial Resource Strain (CARDIA)    Difficulty of Paying Living Expenses: Hard  Food Insecurity: No Food Insecurity (08/30/2022)   Hunger Vital Sign    Worried About Running Out of Food in the Last Year: Never true    Ran Out of Food in the Last Year: Never true  Transportation Needs: No Transportation Needs (08/30/2022)   PRAPARE - Hydrologist (Medical): No    Lack of Transportation (Non-Medical): No  Physical Activity: Sufficiently Active (08/09/2021)   Exercise Vital Sign    Days of Exercise per Week: 5 days    Minutes of Exercise per Session: 30 min  Stress: No Stress Concern Present (08/09/2021)   [place]    Feeling of Stress : Not at all  Social Connections: Moderately Isolated (08/09/2021)   Social Connection and Isolation Panel [NHANES]    Frequency of Communication with Friends and Family: More than three times a week    Frequency of Social Gatherings with Friends and Family: More than three times a week    Attends Religious Services: Never    Marine scientist or Organizations: No     Attends Archivist Meetings: Never    Marital Status: Married  Human resources officer Violence: Not At Risk (08/30/2022)   Humiliation, Afraid, Rape, and Kick questionnaire    Fear of Current or Ex-Partner: No    Emotionally Abused: No    Physically Abused: No    Sexually Abused: No    Family History  Problem Relation Age of Onset   Emphysema Mother    Diabetes Mother    Emphysema Father    Cancer Sister        Stomach  cancer   Diabetes Sister    Stomach cancer Sister    Diabetes Brother    Colon cancer Neg Hx    Colon polyps Neg Hx    Esophageal cancer Neg Hx    Rectal cancer Neg Hx      Review of Systems  Constitutional: Negative.  Negative for chills and fever.  HENT: Negative.  Negative for congestion and sore throat.   Respiratory: Negative.  Negative for cough and shortness of breath.   Cardiovascular: Negative.  Negative for chest pain and palpitations.  Gastrointestinal:  Positive for abdominal pain, nausea and vomiting. Negative for blood in stool, constipation, diarrhea and melena.  Genitourinary: Negative.  Negative for dysuria and hematuria.  Skin: Negative.  Negative for rash.  Neurological: Negative.  Negative for dizziness and headaches.  All other systems reviewed and are negative.   Vitals:   09/21/22 1411  BP: 118/84  Pulse: 95  Temp: 98.6 F (37 C)  SpO2: 96%    Physical Exam Vitals reviewed.  Constitutional:      Appearance: Normal appearance.  HENT:     Head: Normocephalic.     Mouth/Throat:     Mouth: Mucous membranes are moist.     Pharynx: Oropharynx is clear.  Eyes:     Extraocular Movements: Extraocular movements intact.     Conjunctiva/sclera: Conjunctivae normal.     Pupils: Pupils are equal, round, and reactive to light.  Cardiovascular:     Rate and Rhythm: Normal rate and regular rhythm.     Pulses: Normal pulses.     Heart sounds: Normal heart sounds.  Pulmonary:     Effort: Pulmonary effort is normal.     Breath  sounds: Normal breath sounds.  Abdominal:     Palpations: Abdomen is soft.     Tenderness: There is no abdominal tenderness.  Musculoskeletal:     Cervical back: No tenderness.     Right lower leg: No edema.     Left lower leg: No edema.  Lymphadenopathy:     Cervical: No cervical adenopathy.  Skin:    General: Skin is warm and dry.  Neurological:     General: No focal deficit present.     Mental Status: He is alert and oriented to person, place, and time.  Psychiatric:        Mood and Affect: Mood normal.        Behavior: Behavior normal.      ASSESSMENT & PLAN: A total of 47 minutes was spent with the patient and counseling/coordination of care regarding preparing for this visit, review of most recent office visit notes, review of most recent hospital discharge summary notes, review of multiple chronic medical conditions under management, review of all medications and changes made, cardiovascular risks associated with hypertension and diabetes, education on nutrition, prognosis, documentation and need for follow-up.  Problem List Items Addressed This Visit       Cardiovascular and Mediastinum   CAD (coronary artery disease)    Stable.  No anginal episodes Continues daily baby aspirin      Hypertension associated with diabetes ([place])    Well-controlled hypertension BP Readings from Last 3 Encounters:  09/21/22 118/84  08/31/22 107/65  08/25/22 (!) 149/86  Continue amlodipine 5 mg daily Well-controlled diabetes with hemoglobin A1c of 7.1. Continues taking [place] daily 20 to 25 units Continue Trulicity 1.5 mg weekly, glipizide 10 mg daily and Farxiga 10 mg daily Cardiovascular risks associated with hypertension and diabetes discussed Diet and  nutrition discussed Follow-up in 3 months         Respiratory   COPD (chronic obstructive pulmonary disease) (HCC)    Stable Continues Breztri 2 puffs twice a day and albuterol as needed        Endocrine   Dyslipidemia  associated with type 2 diabetes mellitus (HCC)    Chronic stable condition Continue atorvastatin 20 mg daily Diet and nutrition discussed         Other   Nausea - Primary    Differential diagnosis discussed Most likely related to recent opiate use Could be related to Trulicity use States he takes oxycodone occasionally.  Advised to stop and use Advil dual action as needed for the next several days      Relevant Medications   ondansetron (ZOFRAN-ODT) 4 MG disintegrating tablet   Other Relevant Orders   CBC with Differential/Platelet   Comprehensive metabolic panel   Medication side effect    Side effects secondary to opiates Advised to stop opiates Pain management discussed      Other Visit Diagnoses     Need for vaccination       Relevant Orders   Flu Vaccine QUAD High Dose(Fluad) (Completed)      Patient Instructions  Nuseas, en adultos Nausea, Adult Las nuseas se describen como la sensacin de que va a vomitar. Sentir que va a vomitar a menudo no es grave, pero puede ser un signo temprano de un problema mdico ms grave. Los vmitos se producen cuando el contenido del Insurance underwriter con fuerza por la boca. Si vomita, o si no puede beber una cantidad suficiente de lquido, es posible que no tenga suficiente agua en el cuerpo (se deshidrate). Si no tiene suficiente agua en el cuerpo, es posible que usted: Se sienta cansado. Sienta sed. Tenga la boca seca. Tenga los labios agrietados. Haga pis (orine) con menos frecuencia. Los [employer] y las personas que tienen otras enfermedades o un sistema de defensa del cuerpo (sistema inmunitario) dbil corren un mayor riesgo de no tener suficiente agua en el cuerpo. Los [employer] del tratamiento de esta afeccin son los siguientes: Aliviar sus nuseas. Asegurarse de que las nuseas se produzcan con menos frecuencia. Prevenir los vmitos y la prdida de demasiado lquido. Siga estas indicaciones en su  casa: Controle sus sntomas para detectar cualquier cambio. Dgale a su mdico sobre ellos. Qu debe comer y beber     Tome una SRO (solucin de rehidratacin oral). Es Ardelia Mems bebida que se vende en farmacias y tiendas. En la medida en que pueda, beba lquidos transparentes en pequeas cantidades. Estos incluyen los siguientes: [place]. Trocitos de hielo. Jugo de frutas con agua agregada (jugo de frutas diluido). Bebidas deportivas de bajas caloras. En la medida en que pueda, consuma alimentos blandos y fciles de digerir en pequeas cantidades, por ejemplo, los siguientes: Bananas. Pur de [employer]. Arroz. Carnes bajas en grasa Dorothea Glassman). Pan tostado. Galletas. Evite beber lquidos con alto contenido de azcar o cafena. Esto incluye bebidas energticas, bebidas deportivas y gaseosas. Evite tomar alcohol. Evite los alimentos condimentados o con alto contenido de [place]. Indicaciones generales Use los medicamentos de venta libre y los recetados solamente como se lo haya indicado el mdico. Descanse en su casa hasta sentirse mejor. Beba suficiente lquido para Contractor pis (la orina) de color amarillo plido. Respire de forma lenta y profunda cuando sienta ganas de vomitar. Evite los alimentos o las cosas que tengan olores fuertes. Lvese las manos frecuentemente con agua  y jabn durante al menos 20 segundos. Use un desinfectante para manos si no dispone de [place]. Asegrese de que todas las personas que viven en su casa se laven bien las manos y con frecuencia. Concurra a [place]. Comunquese con un mdico si: Se siente peor. Siente que va a vomitar y esto dura ms de 2 das. Vomita. No puede beber lquidos sin vomitar. Aparecen nuevos sntomas. Tiene fiebre. Tiene dolor de [place]. Tiene calambres musculares. Tiene una erupcin cutnea. Siente dolor al [employer]. Se siente aturdido o mareado. Solicite ayuda de inmediato si: [employer],  el cuello, los brazos o la [place]. Se siente muy dbil o se desmaya. Vomita y el vmito es de color rojo intenso o se asemeja al poso del caf. Tiene deposiciones (heces) con sangre o de color negro, o con aspecto alquitranado. Siente un dolor de cabeza intenso, rigidez en el cuello, o ambas cosas. Tiene dolor muy intenso en el vientre (abdomen), clicos o meteorismo. Tiene problemas para respirar o respira muy rpidamente. Su corazn late muy rpidamente. Siente la piel fra y hmeda. Se siente confundido. Tiene signos de haber perdido [place] cantidad excesiva de agua del cuerpo, como: [place], muy escasa o falta de [place]. Labios agrietados. Sequedad de boca. Ojos hundidos. Somnolencia. Debilidad. Estos sntomas pueden Sales executive. Solicite ayuda de inmediato. Llame al 911. No espere a ver si los sntomas desaparecen. No conduzca por sus propios medios [employer]. Resumen Las nuseas se describen como la sensacin de que va a vomitar. Si vomita, o si no puede beber una cantidad suficiente de lquido, es posible que no tenga suficiente agua en el cuerpo (se deshidrate). Coma y beba lo que le indique el mdico. Use los medicamentos de venta libre y los recetados solamente como se lo haya indicado el mdico. Comunquese con un mdico de inmediato si sus sntomas empeoran o si tiene nuevos sntomas. Concurra a [place]. Esta informacin no tiene Marine scientist el consejo del mdico. Asegrese de hacerle al mdico cualquier pregunta que tenga. Document Revised: 01/27/2021 Document Reviewed: 01/27/2021 Elsevier Patient Education  [place], MD [facility]

## 2022-09-21 NOTE — Assessment & Plan Note (Signed)
Stable Continues Breztri 2 puffs twice a day and albuterol as needed

## 2022-09-21 NOTE — Assessment & Plan Note (Signed)
Well-controlled hypertension BP Readings from Last 3 Encounters:  09/21/22 118/84  08/31/22 107/65  08/25/22 (!) 149/86  Continue amlodipine 5 mg daily Well-controlled diabetes with hemoglobin A1c of 7.1. Continues taking [place] daily 20 to 25 units Continue Trulicity 1.5 mg weekly, glipizide 10 mg daily and Farxiga 10 mg daily Cardiovascular risks associated with hypertension and diabetes discussed Diet and nutrition discussed Follow-up in 3 months

## 2022-09-21 NOTE — Patient Instructions (Signed)
Nuseas, en adultos Nausea, Adult Las nuseas se describen como la sensacin de que va a vomitar. Sentir que va a vomitar a menudo no es grave, pero puede ser un signo temprano de un problema mdico ms grave. Los vmitos se producen cuando el contenido del Insurance underwriter con fuerza por la boca. Si vomita, o si no puede beber una cantidad suficiente de lquido, es posible que no tenga suficiente agua en el cuerpo (se deshidrate). Si no tiene suficiente agua en el cuerpo, es posible que usted: Se sienta cansado. Sienta sed. Tenga la boca seca. Tenga los labios agrietados. Haga pis (orine) con menos frecuencia. Los [employer] y las personas que tienen otras enfermedades o un sistema de defensa del cuerpo (sistema inmunitario) dbil corren un mayor riesgo de no tener suficiente agua en el cuerpo. Los [employer] del tratamiento de esta afeccin son los siguientes: Aliviar sus nuseas. Asegurarse de que las nuseas se produzcan con menos frecuencia. Prevenir los vmitos y la prdida de demasiado lquido. Siga estas indicaciones en su casa: Controle sus sntomas para detectar cualquier cambio. Dgale a su mdico sobre ellos. Qu debe comer y beber     Tome una SRO (solucin de rehidratacin oral). Es Ardelia Mems bebida que se vende en farmacias y tiendas. En la medida en que pueda, beba lquidos transparentes en pequeas cantidades. Estos incluyen los siguientes: [place]. Trocitos de hielo. Jugo de frutas con agua agregada (jugo de frutas diluido). Bebidas deportivas de bajas caloras. En la medida en que pueda, consuma alimentos blandos y fciles de digerir en pequeas cantidades, por ejemplo, los siguientes: Bananas. Pur de [employer]. Arroz. Carnes bajas en grasa Dorothea Glassman). Pan tostado. Galletas. Evite beber lquidos con alto contenido de azcar o cafena. Esto incluye bebidas energticas, bebidas deportivas y gaseosas. Evite tomar alcohol. Evite los alimentos condimentados o con  alto contenido de [place]. Indicaciones generales Use los medicamentos de venta libre y los recetados solamente como se lo haya indicado el mdico. Descanse en su casa hasta sentirse mejor. Beba suficiente lquido para Contractor pis (la orina) de color amarillo plido. Respire de forma lenta y profunda cuando sienta ganas de vomitar. Evite los alimentos o las cosas que tengan olores fuertes. Lvese las manos frecuentemente con agua y jabn durante al menos 20 segundos. Use un desinfectante para manos si no dispone de [place] y [place]. Asegrese de que todas las personas que viven en su casa se laven bien las manos y con frecuencia. Concurra a [facility]. Comunquese con un mdico si: Se siente peor. Siente que va a vomitar y esto dura ms de 2 das. Vomita. No puede beber lquidos sin vomitar. Aparecen nuevos sntomas. Tiene fiebre. Tiene dolor de [place]. Tiene calambres musculares. Tiene una erupcin cutnea. Siente dolor al [employer]. Se siente aturdido o mareado. Solicite ayuda de inmediato si: [employer], el cuello, los brazos o la [place]. Se siente muy dbil o se desmaya. Vomita y el vmito es de color rojo intenso o se asemeja al poso del caf. Tiene deposiciones (heces) con sangre o de color negro, o con aspecto alquitranado. Siente un dolor de cabeza intenso, rigidez en el cuello, o ambas cosas. Tiene dolor muy intenso en el vientre (abdomen), clicos o meteorismo. Tiene problemas para respirar o respira muy rpidamente. Su corazn late muy rpidamente. Siente la piel fra y hmeda. Se siente confundido. Tiene signos de haber perdido [place] cantidad excesiva de agua del cuerpo, como: [place], muy escasa o falta  de [place]. Labios agrietados. Sequedad de boca. Ojos hundidos. Somnolencia. Debilidad. Estos sntomas pueden Sales executive. Solicite ayuda de inmediato. Llame al 911. No espere a ver si los sntomas desaparecen. No  conduzca por sus propios medios [employer]. Resumen Las nuseas se describen como la sensacin de que va a vomitar. Si vomita, o si no puede beber una cantidad suficiente de lquido, es posible que no tenga suficiente agua en el cuerpo (se deshidrate). Coma y beba lo que le indique el mdico. Use los medicamentos de venta libre y los recetados solamente como se lo haya indicado el mdico. Comunquese con un mdico de inmediato si sus sntomas empeoran o si tiene nuevos sntomas. Concurra a [place]. Esta informacin no tiene Marine scientist el consejo del mdico. Asegrese de hacerle al mdico cualquier pregunta que tenga. Document Revised: 01/27/2021 Document Reviewed: 01/27/2021 Elsevier Patient Education  [place].

## 2022-09-21 NOTE — Assessment & Plan Note (Signed)
Differential diagnosis discussed Most likely related to recent opiate use Could be related to Trulicity use States he takes oxycodone occasionally.  Advised to stop and use Advil dual action as needed for the next several days

## 2022-09-21 NOTE — Assessment & Plan Note (Signed)
Stable.  No anginal episodes Continues daily baby aspirin

## 2022-09-27 ENCOUNTER — Ambulatory Visit: Payer: Medicare HMO | Admitting: Physical Therapy

## 2022-09-27 ENCOUNTER — Encounter: Payer: Self-pay | Admitting: Physical Therapy

## 2022-09-27 DIAGNOSIS — R600 Localized edema: Secondary | ICD-10-CM

## 2022-09-27 DIAGNOSIS — M25512 Pain in left shoulder: Secondary | ICD-10-CM

## 2022-09-27 DIAGNOSIS — M6281 Muscle weakness (generalized): Secondary | ICD-10-CM

## 2022-09-27 DIAGNOSIS — Z96612 Presence of left artificial shoulder joint: Secondary | ICD-10-CM | POA: Diagnosis not present

## 2022-09-27 NOTE — Therapy (Signed)
OUTPATIENT PHYSICAL THERAPY TREATMENT NOTE   Patient Name: Duane Cuevas MRN: [id number] DOB:Oct 16, 1955, 67 y.o., male Today's Date: 09/27/2022  PCP: [name], MD   REFERRING PROVIDER: Meredith Pel, MD   PT End of Session - 09/27/22 1245     Visit Number 2    Number of Visits --   1-3x/week   Date for PT Re-Evaluation 11/16/22    Authorization Type Aetna - FOTO    PT Start Time [id number]    PT Stop Time 1326    PT Time Calculation (min) 41 min             Past Medical History:  Diagnosis Date   Diabetes mellitus ([place]) 10/2014   pt denies being diabetic.    Emphysema of lung ([place])    Emphysema/COPD ([place]) 10/2014   Hepatic steatosis    Thrombocytopenia ([place]) 09/2015   platelets in 120s.    Past Surgical History:  Procedure Laterality Date   BIOPSY  09/16/2020   Procedure: BIOPSY;  Surgeon: Thornton Park, MD;  Location[facility];  Service: Gastroenterology;;   CHOLECYSTECTOMY N/A 03/20/2022   Procedure: LAPAROSCOPIC CHOLECYSTECTOMY;  Surgeon: Armandina Gemma, MD;  Location: [facility];  Service: General;  Laterality: N/A;   ESOPHAGOGASTRODUODENOSCOPY N/A 09/23/2015   Procedure: ESOPHAGOGASTRODUODENOSCOPY (EGD);  Surgeon: Ladene Artist, MD;  Location: [facility];  Service: Endoscopy;  Laterality: N/A;   ESOPHAGOGASTRODUODENOSCOPY (EGD) WITH PROPOFOL N/A 09/16/2020   Procedure: ESOPHAGOGASTRODUODENOSCOPY (EGD) WITH PROPOFOL;  Surgeon: Thornton Park, MD;  [facility];  Service: Gastroenterology;  Laterality: N/A;   FOOT SURGERY Left 1999   HERNIA REPAIR     INGUINAL HERNIA REPAIR Left 05/15/2018   Procedure: LEFT INGUINAL HERNIA REPAIR WITH MESH;  Surgeon: Erroll Luna, MD;  Location: [place];  Service: General;  Laterality: Left;   INSERTION OF MESH Left 05/15/2018   Procedure: INSERTION OF MESH;  Surgeon: Erroll Luna, MD;  Location: [place];  Service: General;  Laterality: Left;   INTRAOPERATIVE CHOLANGIOGRAM N/A 03/20/2022    Procedure: INTRAOPERATIVE CHOLANGIOGRAM;  Surgeon: Armandina Gemma, MD;  Location: [facility];  Service: General;  Laterality: N/A;   [place]   patient stated no surgery did have fracture from [place] Left 08/30/2022   Procedure: LEFT REVERSE SHOULDER ARTHROPLASTY;  Surgeon: Meredith Pel, MD;  Location: [place];  Service: Orthopedics;  Laterality: Left;   SHOULDER ARTHROSCOPY W/ ROTATOR CUFF REPAIR Left    patient doesn't recall   UPPER GASTROINTESTINAL ENDOSCOPY     Patient Active Problem List   Diagnosis Date Noted   Nausea 09/21/2022   Medication side effect 09/21/2022   Rotator cuff arthropathy, left 08/31/2022   Biceps tendonitis on left 08/31/2022   Retained orthopedic hardware 08/31/2022   S/P reverse total shoulder arthroplasty, left 08/30/2022   Seizure ([place]) 12/09/2021   Intractable persistent migraine aura without cerebral infarction and with status migrainosus 05/26/2021   Hypertension associated with diabetes ([place]) 09/21/2020   Uncontrolled type 2 diabetes mellitus with hyperglycemia ([place]) 09/13/2020   CAD (coronary artery disease) 01/23/2020   HLD (hyperlipidemia) 11/02/2019   History of MI (myocardial infarction) 11/02/2019   Cigarette smoker 08/15/2018   COPD ? GOLD III/ active smoker 08/14/2018   History of diet-controlled diabetes 06/30/2018   COPD (chronic obstructive pulmonary disease) ([place]) 10/14/2014   Dyslipidemia associated with type 2 diabetes mellitus ([place]) 10/14/2014    THERAPY DIAG:  Left shoulder pain, unspecified chronicity  Muscle weakness  Localized edema   Rationale for Evaluation and Treatment Rehabilitation  REFERRING DIAG: L RTSA with subscap repair   PERTINENT HISTORY: History of MI (denies), COPD, DM TII, Seizure (denies)    PRECAUTIONS/RESTRICTIONS:   RTSA 2/27 with subscap repair   OP Date: 08/30/2022  2 weeks 09/13/2022  4 weeks 09/27/2022  6 weeks 10/11/2022  8 weeks 10/25/2022  10  weeks 11/08/2022  12 weeks 11/22/2022    SUBJECTIVE:  Pt reports that his shoulder is doing pretty well.  He has moderate pain during the day, but has more severe pain at night.  Pain:  Are you having pain? Yes Pain location: L shoulder NPRS scale:  5/10 to 10/10 Aggravating factors: arm movements, sleeping Relieving factors: pain meds, rest Pain description: constant, sharp, and aching Stage: Subacute Stability: getting better 24 hour pattern: worse at night   OBJECTIVE: (objective measures completed at initial evaluation unless otherwise dated)  GENERAL OBSERVATION:          Pt enters clinic with no sling                               SENSATION:          Light touch: Appears intact on exam           PALPATION: Diffuse TTP L shoulder   UPPER EXTREMITY PROM:   ROM Right 09/21/2022 Left 09/21/2022  Shoulder flexion WFL 100  Shoulder abduction WFL 60  Shoulder internal rotation      Shoulder external rotation   30 degrees (empty)  Functional IR      Functional ER      Shoulder extension      Elbow extension      Elbow flexion        (Blank rows = not tested, N = WNL, * = concordant pain with testing)   UPPER EXTREMITY MMT:   MMT Right 09/21/2022 Left 09/21/2022  Shoulder flexion      Shoulder abduction (C5)      Shoulder ER      Shoulder IR      Middle trapezius      Lower trapezius      Shoulder extension      Grip strength      Cervical flexion (C1,C2)      Cervical S/B (C3)      Shoulder shrug (C4)      Elbow flexion (C6)      Elbow ext (C7)      Thumb ext (C8)      Finger abd (T1)      Grossly        (Blank rows = not tested, score listed is out of 5 possible points.  N = WNL, D = diminished, C = clear for gross weakness with myotome testing, * = concordant pain with testing)   PATIENT SURVEYS:  FOTO 39 -> 65              TODAY'S TREATMENT:  Creating, reviewing, and completing below HEP     PATIENT EDUCATION:  POC, diagnosis, prognosis, HEP,  and outcome measures.  Pt educated via explanation, demonstration, and handout (HEP).  Pt confirms understanding verbally.    HOME EXERCISE PROGRAM: Access Code: [email or web address] URL: [email or web address] Date: 09/21/2022 Prepared by: Shearon Balo   Exercises - Seated Scapular Retraction  - 3 x daily - 7 x weekly - 3 sets - 10 reps - Seated Shoulder Flexion  Towel Slide at Table Top  - 3 x daily - 7 x weekly - 2 sets - 10 reps - Seated Elbow Flexion and Extension AROM  - 3 x daily - 7 x weekly - 3 sets - 10 reps   Treatment priorities     Eval (09/21/2022)              Gentle PROM -> AAROM                                                                             TREATMENT 3/26:  Therapeutic Exercise: - scapular retraction - reviewing HEP  Manual Therapy: - Gentle ER to 30 degrees - Gentle flexion to 100 degrees  Modalities:  Vasopneumatic (Game Ready)    Location:  left shoulder Time:  15 minutes Pressure:  low Temperature:  38 degrees   ASSESSMENT:   CLINICAL IMPRESSION: Logun tolerated session well with no adverse reaction.  Concentrated on progressive gentle PROM.  Pt with initial muscle guarding which reduced with time.  Able to achieve 100 degrees passive ROM with minimal discomfort.     OBJECTIVE IMPAIRMENTS: Pain, shoulder AROM and PROM   ACTIVITY LIMITATIONS: lifting, reaching, lifting   PERSONAL FACTORS: See medical history and pertinent history     REHAB POTENTIAL: Good   CLINICAL DECISION MAKING: Stable/uncomplicated   EVALUATION COMPLEXITY: Low     GOALS:     SHORT TERM GOALS: Target date: 10/19/2022   Tyvan will be >75% HEP compliant to improve carryover between sessions and facilitate independent management of condition   Evaluation (09/21/2022): ongoing Goal status: INITIAL     LONG TERM GOALS: Target date: 11/16/2022   Paulie will improve FOTO score to 39 as a proxy for functional improvement   Evaluation/Baseline  (09/21/2022): 65 Goal status: INITIAL     2.  Jasdeep will self report >/= 50% decrease in pain from evaluation    Evaluation/Baseline (09/21/2022): 10/10 max pain Goal status: INITIAL     3.  Braxxton will demonstrate >120 degrees of active ROM in flexion to allow completion of activities involving reaching OH, not limited by pain   Evaluation/Baseline (09/21/2022): 100 degrees passive ROM Goal status: INITIAL     4.  Latrelle will be compliant with post op precautions throughout therapy.   Evaluation/Baseline (09/21/2022): precautions reviewed Goal status: INITIAL     5.  Timothee will be able to reach to cabinet repeatedly with at least 3#, not limited by pain   Evaluation/Baseline (09/21/2022): limited Goal status: INITIAL       PLAN: PT FREQUENCY: 1-2x/week   PT DURATION: 8 weeks (Ending 11/16/2022)   PLANNED INTERVENTIONS: Therapeutic exercises, Aquatic therapy, Therapeutic activity, Neuro Muscular re-education, Gait training, Patient/Family education, Joint mobilization, Dry Needling, Electrical stimulation, Spinal mobilization and/or manipulation, Moist heat, Taping, Vasopneumatic device, Ionotophoresis 4mg /ml Dexamethasone, and Manual therapy   Kevan Ny Ziasia Lenoir PT 09/27/2022, 1:28 PM

## 2022-09-28 ENCOUNTER — Encounter: Payer: Self-pay | Admitting: Emergency Medicine

## 2022-09-29 ENCOUNTER — Other Ambulatory Visit: Payer: Self-pay | Admitting: *Deleted

## 2022-09-29 DIAGNOSIS — E1165 Type 2 diabetes mellitus with hyperglycemia: Secondary | ICD-10-CM

## 2022-09-29 MED ORDER — TRULICITY 1.5 MG/0.5ML ~~LOC~~ SOAJ: SUBCUTANEOUS | 10 refills | Status: DC

## 2022-10-04 ENCOUNTER — Ambulatory Visit: Payer: Medicare HMO | Attending: Orthopedic Surgery

## 2022-10-04 DIAGNOSIS — M6281 Muscle weakness (generalized): Secondary | ICD-10-CM

## 2022-10-04 DIAGNOSIS — R600 Localized edema: Secondary | ICD-10-CM | POA: Diagnosis not present

## 2022-10-04 DIAGNOSIS — M25512 Pain in left shoulder: Secondary | ICD-10-CM | POA: Insufficient documentation

## 2022-10-04 NOTE — Therapy (Signed)
OUTPATIENT PHYSICAL THERAPY TREATMENT NOTE   Patient Name: Duane Cuevas MRN: [id number] DOB:Apr 25, 1956, 67 y.o., male Today's Date: 10/04/2022  PCP: [name], MD   REFERRING PROVIDER: Meredith Pel, MD   PT End of Session - 10/04/22 1514     Visit Number 3    Date for PT Re-Evaluation 11/16/22    Authorization Type Aetna - FOTO    PT Start Time [id number]    PT Stop Time 1618    PT Time Calculation (min) 48 min    Activity Tolerance Patient tolerated treatment well    Behavior During Therapy [facility] for tasks assessed/performed              Past Medical History:  Diagnosis Date   Diabetes mellitus 10/2014   pt denies being diabetic.    Emphysema of lung    Emphysema/COPD 10/2014   Hepatic steatosis    Thrombocytopenia 09/2015   platelets in 120s.    Past Surgical History:  Procedure Laterality Date   BIOPSY  09/16/2020   Procedure: BIOPSY;  Surgeon: Thornton Park, MD;  Location: [facility];  Service: Gastroenterology;;   CHOLECYSTECTOMY N/A 03/20/2022   Procedure: LAPAROSCOPIC CHOLECYSTECTOMY;  Surgeon: Armandina Gemma, MD;  [facility];  Service: General;  Laterality: N/A;   ESOPHAGOGASTRODUODENOSCOPY N/A 09/23/2015   Procedure: ESOPHAGOGASTRODUODENOSCOPY (EGD);  Surgeon: Ladene Artist, MD;  Location: [facility];  Service: Endoscopy;  Laterality: N/A;   ESOPHAGOGASTRODUODENOSCOPY (EGD) WITH PROPOFOL N/A 09/16/2020   Procedure: ESOPHAGOGASTRODUODENOSCOPY (EGD) WITH PROPOFOL;  Surgeon: Thornton Park, MD;  [facility];  Service: Gastroenterology;  Laterality: N/A;   FOOT SURGERY Left 1999   HERNIA REPAIR     INGUINAL HERNIA REPAIR Left 05/15/2018   Procedure: LEFT INGUINAL HERNIA REPAIR WITH MESH;  Surgeon: Erroll Luna, MD;  Location: [place];  Service: General;  Laterality: Left;   INSERTION OF MESH Left 05/15/2018   Procedure: INSERTION OF MESH;  Surgeon: Erroll Luna, MD;  Location: [place];  Service: General;   Laterality: Left;   INTRAOPERATIVE CHOLANGIOGRAM N/A 03/20/2022   Procedure: INTRAOPERATIVE CHOLANGIOGRAM;  Surgeon: Armandina Gemma, MD;  [facility];  Service: General;  Laterality: N/A;   [place]   patient stated no surgery did have fracture from [place] Left 08/30/2022   Procedure: LEFT REVERSE SHOULDER ARTHROPLASTY;  Surgeon: Meredith Pel, MD;  Location: [place];  Service: Orthopedics;  Laterality: Left;   SHOULDER ARTHROSCOPY W/ ROTATOR CUFF REPAIR Left    patient doesn't recall   UPPER GASTROINTESTINAL ENDOSCOPY     Patient Active Problem List   Diagnosis Date Noted   Nausea 09/21/2022   Medication side effect 09/21/2022   Rotator cuff arthropathy, left 08/31/2022   Biceps tendonitis on left 08/31/2022   Retained orthopedic hardware 08/31/2022   S/P reverse total shoulder arthroplasty, left 08/30/2022   Seizure 12/09/2021   Intractable persistent migraine aura without cerebral infarction and with status migrainosus 05/26/2021   Hypertension associated with diabetes 09/21/2020   Uncontrolled type 2 diabetes mellitus with hyperglycemia 09/13/2020   CAD (coronary artery disease) 01/23/2020   HLD (hyperlipidemia) 11/02/2019   History of MI (myocardial infarction) 11/02/2019   Cigarette smoker 08/15/2018   COPD ? GOLD III/ active smoker 08/14/2018   History of diet-controlled diabetes 06/30/2018   COPD (chronic obstructive pulmonary disease) 10/14/2014   Dyslipidemia associated with type 2 diabetes mellitus 10/14/2014    THERAPY DIAG:  Left shoulder pain, unspecified chronicity  Muscle  weakness  Localized edema   Rationale for Evaluation and Treatment Rehabilitation  REFERRING DIAG: L RTSA with subscap repair   PERTINENT HISTORY: History of MI (denies), COPD, DM TII, Seizure (denies)    PRECAUTIONS/RESTRICTIONS:   RTSA 2/27 with subscap repair   OP Date: 08/30/2022  2 weeks 09/13/2022  4 weeks 09/27/2022  6  weeks 10/11/2022  8 weeks 10/25/2022  10 weeks 11/08/2022  12 weeks 11/22/2022    SUBJECTIVE:  Patient reports his pain is the worst overnight, getting to a 10/10.   Pain:  Are you having pain? Yes Pain location: L shoulder NPRS scale:  4-5/10 to 10/10 Aggravating factors: arm movements, sleeping Relieving factors: pain meds, rest Pain description: constant, sharp, and aching Stage: Subacute Stability: getting better 24 hour pattern: worse at night   OBJECTIVE: (objective measures completed at initial evaluation unless otherwise dated)  GENERAL OBSERVATION:          Pt enters clinic with no sling                               SENSATION:          Light touch: Appears intact on exam           PALPATION: Diffuse TTP L shoulder   UPPER EXTREMITY PROM:   ROM Right 09/21/2022 Left 09/21/2022  Shoulder flexion WFL 100  Shoulder abduction WFL 60  Shoulder internal rotation      Shoulder external rotation   30 degrees (empty)  Functional IR      Functional ER      Shoulder extension      Elbow extension      Elbow flexion        (Blank rows = not tested, N = WNL, * = concordant pain with testing)   UPPER EXTREMITY MMT:   MMT Right 09/21/2022 Left 09/21/2022  Shoulder flexion      Shoulder abduction (C5)      Shoulder ER      Shoulder IR      Middle trapezius      Lower trapezius      Shoulder extension      Grip strength      Cervical flexion (C1,C2)      Cervical S/B (C3)      Shoulder shrug (C4)      Elbow flexion (C6)      Elbow ext (C7)      Thumb ext (C8)      Finger abd (T1)      Grossly        (Blank rows = not tested, score listed is out of 5 possible points.  N = WNL, D = diminished, C = clear for gross weakness with myotome testing, * = concordant pain with testing)   PATIENT SURVEYS:  FOTO 39 -> 65              TODAY'S TREATMENT:  Creating, reviewing, and completing below HEP     PATIENT EDUCATION:  POC, diagnosis, prognosis, HEP, and outcome  measures.  Pt educated via explanation, demonstration, and handout (HEP).  Pt confirms understanding verbally.    HOME EXERCISE PROGRAM: Access Code: [email or web address] URL: [email or web address] Date: 09/21/2022 Prepared by: Shearon Balo   Exercises - Seated Scapular Retraction  - 3 x daily - 7 x weekly - 3 sets - 10 reps - Seated Shoulder Flexion Towel Slide at Table Top  - 3 x  daily - 7 x weekly - 2 sets - 10 reps - Seated Elbow Flexion and Extension AROM  - 3 x daily - 7 x weekly - 3 sets - 10 reps   Treatment priorities     Eval (09/21/2022)              Gentle PROM -> AAROM                                                                             TREATMENT 4/2: Therapeutic Exercise: - scapular retraction 2x10 - seated elbow flexion/extension AROM 2x10 - seated isometric with therapist resistance, in scapular plane, pt in chair with elbow on pillow on table flexion/extension/abduction (most painful with abduction) - supine flexion AAROM with dowel x10  Manual Therapy: - Gentle ER to 30 degrees - Gentle flexion to 100 degrees  Modalities: Vasopneumatic (Game Ready)   Location:  left shoulder Time:  15 minutes Pressure:  low Temperature:  38 degrees   TREATMENT 3/26:  Therapeutic Exercise: - scapular retraction - reviewing HEP  Manual Therapy: - Gentle ER to 30 degrees - Gentle flexion to 100 degrees  Modalities:  Vasopneumatic (Game Ready)    Location:  left shoulder Time:  15 minutes Pressure:  low Temperature:  38 degrees   ASSESSMENT:   CLINICAL IMPRESSION: Patient presents to PT reporting continued Lt shoulder pain, especially in the evening, and reports HEP compliance. Session today focused on gentle isometrics in scapular plane, most painful being into abduction for middle deltoid, terminated this motion after 1 repetition. Gentle ROM within protocol limitations performed. Patient was able to tolerate all prescribed exercises with  no adverse effects. Patient continues to benefit from skilled PT services and should be progressed as able to improve functional independence.     OBJECTIVE IMPAIRMENTS: Pain, shoulder AROM and PROM   ACTIVITY LIMITATIONS: lifting, reaching, lifting   PERSONAL FACTORS: See medical history and pertinent history     REHAB POTENTIAL: Good   CLINICAL DECISION MAKING: Stable/uncomplicated   EVALUATION COMPLEXITY: Low     GOALS:     SHORT TERM GOALS: Target date: 10/19/2022   Jarel will be >75% HEP compliant to improve carryover between sessions and facilitate independent management of condition   Evaluation (09/21/2022): ongoing Goal status: INITIAL     LONG TERM GOALS: Target date: 11/16/2022   Kishawn will improve FOTO score to 39 as a proxy for functional improvement   Evaluation/Baseline (09/21/2022): 65 Goal status: INITIAL     2.  Dwan will self report >/= 50% decrease in pain from evaluation    Evaluation/Baseline (09/21/2022): 10/10 max pain Goal status: INITIAL     3.  Khalfani will demonstrate >120 degrees of active ROM in flexion to allow completion of activities involving reaching OH, not limited by pain   Evaluation/Baseline (09/21/2022): 100 degrees passive ROM Goal status: INITIAL     4.  Zyree will be compliant with post op precautions throughout therapy.   Evaluation/Baseline (09/21/2022): precautions reviewed Goal status: INITIAL     5.  Jehan will be able to reach to cabinet repeatedly with at least 3#, not limited by pain   Evaluation/Baseline (09/21/2022): limited Goal status: INITIAL  PLAN: PT FREQUENCY: 1-2x/week   PT DURATION: 8 weeks (Ending 11/16/2022)   PLANNED INTERVENTIONS: Therapeutic exercises, Aquatic therapy, Therapeutic activity, Neuro Muscular re-education, Gait training, Patient/Family education, Joint mobilization, Dry Needling, Electrical stimulation, Spinal mobilization and/or manipulation, Moist heat, Taping, Vasopneumatic  device, Ionotophoresis 4mg /ml Dexamethasone, and Manual therapy   Margarette Canada PTA 10/04/2022, 4:04 PM
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
## 2022-10-07 ENCOUNTER — Other Ambulatory Visit: Payer: Self-pay | Admitting: Emergency Medicine

## 2022-10-07 ENCOUNTER — Ambulatory Visit: Payer: Medicare HMO

## 2022-10-07 DIAGNOSIS — M6281 Muscle weakness (generalized): Secondary | ICD-10-CM

## 2022-10-07 DIAGNOSIS — R600 Localized edema: Secondary | ICD-10-CM

## 2022-10-07 DIAGNOSIS — E1165 Type 2 diabetes mellitus with hyperglycemia: Secondary | ICD-10-CM

## 2022-10-07 DIAGNOSIS — M25512 Pain in left shoulder: Secondary | ICD-10-CM

## 2022-10-07 DIAGNOSIS — J431 Panlobular emphysema: Secondary | ICD-10-CM

## 2022-10-11 ENCOUNTER — Encounter: Payer: Self-pay | Admitting: Physical Therapy

## 2022-10-11 ENCOUNTER — Ambulatory Visit: Payer: Medicare HMO | Admitting: Physical Therapy

## 2022-10-11 DIAGNOSIS — M6281 Muscle weakness (generalized): Secondary | ICD-10-CM

## 2022-10-11 DIAGNOSIS — R600 Localized edema: Secondary | ICD-10-CM

## 2022-10-11 DIAGNOSIS — M25512 Pain in left shoulder: Secondary | ICD-10-CM
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
## 2022-10-12 ENCOUNTER — Encounter: Payer: Self-pay | Admitting: Orthopedic Surgery

## 2022-10-12 ENCOUNTER — Ambulatory Visit (INDEPENDENT_AMBULATORY_CARE_PROVIDER_SITE_OTHER): Payer: Medicare HMO | Admitting: Orthopedic Surgery

## 2022-10-12 ENCOUNTER — Telehealth: Payer: Self-pay

## 2022-10-12 DIAGNOSIS — Z96612 Presence of left artificial shoulder joint: Secondary | ICD-10-CM
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
## 2022-10-12 NOTE — Telephone Encounter (Signed)
Pls make sure f/u appt is scheduled.

## 2022-10-12 NOTE — Telephone Encounter (Signed)
-----   Message from Cammy Copa, MD sent at 10/12/2022  2:20 PM EDT ----- Cresenciano Lick can you have him follow-up with [facility] in 6 weeks.  Thanks

## 2022-10-13 ENCOUNTER — Ambulatory Visit: Payer: Medicare HMO

## 2022-10-13 DIAGNOSIS — M6281 Muscle weakness (generalized): Secondary | ICD-10-CM

## 2022-10-13 DIAGNOSIS — M25512 Pain in left shoulder: Secondary | ICD-10-CM | POA: Diagnosis not present

## 2022-10-13 DIAGNOSIS — R600 Localized edema: Secondary | ICD-10-CM
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
## 2022-10-18 ENCOUNTER — Ambulatory Visit: Payer: Medicare HMO | Admitting: Physical Therapy

## 2022-10-18 ENCOUNTER — Encounter: Payer: Self-pay | Admitting: Physical Therapy

## 2022-10-18 DIAGNOSIS — R600 Localized edema: Secondary | ICD-10-CM | POA: Diagnosis not present

## 2022-10-18 DIAGNOSIS — M25512 Pain in left shoulder: Secondary | ICD-10-CM

## 2022-10-18 DIAGNOSIS — M6281 Muscle weakness (generalized): Secondary | ICD-10-CM | POA: Diagnosis not present
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
## 2022-10-20 ENCOUNTER — Encounter: Payer: Self-pay | Admitting: Physical Therapy

## 2022-10-20 ENCOUNTER — Ambulatory Visit: Payer: Medicare HMO | Admitting: Physical Therapy

## 2022-10-20 DIAGNOSIS — M25512 Pain in left shoulder: Secondary | ICD-10-CM | POA: Diagnosis not present

## 2022-10-20 DIAGNOSIS — M6281 Muscle weakness (generalized): Secondary | ICD-10-CM | POA: Diagnosis not present

## 2022-10-20 DIAGNOSIS — R600 Localized edema: Secondary | ICD-10-CM | POA: Diagnosis not present
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
## 2022-10-25 ENCOUNTER — Encounter: Payer: Self-pay | Admitting: Physical Therapy

## 2022-10-25 ENCOUNTER — Ambulatory Visit: Payer: Medicare HMO | Admitting: Physical Therapy

## 2022-10-25 DIAGNOSIS — M25512 Pain in left shoulder: Secondary | ICD-10-CM

## 2022-10-25 DIAGNOSIS — M6281 Muscle weakness (generalized): Secondary | ICD-10-CM | POA: Diagnosis not present

## 2022-10-25 DIAGNOSIS — R600 Localized edema: Secondary | ICD-10-CM
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
## 2022-10-27 ENCOUNTER — Encounter: Payer: Self-pay | Admitting: Physical Therapy

## 2022-10-27 ENCOUNTER — Ambulatory Visit: Payer: Medicare HMO | Admitting: Physical Therapy

## 2022-10-27 DIAGNOSIS — M6281 Muscle weakness (generalized): Secondary | ICD-10-CM

## 2022-10-27 DIAGNOSIS — R600 Localized edema: Secondary | ICD-10-CM | POA: Diagnosis not present

## 2022-10-27 DIAGNOSIS — M25512 Pain in left shoulder: Secondary | ICD-10-CM
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
## 2022-11-01 ENCOUNTER — Encounter: Payer: Self-pay | Admitting: Physical Therapy

## 2022-11-01 ENCOUNTER — Ambulatory Visit: Payer: Medicare HMO | Admitting: Physical Therapy

## 2022-11-01 DIAGNOSIS — R600 Localized edema: Secondary | ICD-10-CM

## 2022-11-01 DIAGNOSIS — M25512 Pain in left shoulder: Secondary | ICD-10-CM | POA: Diagnosis not present

## 2022-11-01 DIAGNOSIS — M6281 Muscle weakness (generalized): Secondary | ICD-10-CM
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
## 2022-11-03 ENCOUNTER — Ambulatory Visit: Payer: Medicare HMO | Attending: Orthopedic Surgery | Admitting: Physical Therapy

## 2022-11-03 ENCOUNTER — Encounter: Payer: Self-pay | Admitting: Physical Therapy

## 2022-11-03 DIAGNOSIS — R600 Localized edema: Secondary | ICD-10-CM | POA: Diagnosis not present

## 2022-11-03 DIAGNOSIS — M25512 Pain in left shoulder: Secondary | ICD-10-CM | POA: Insufficient documentation

## 2022-11-03 DIAGNOSIS — M6281 Muscle weakness (generalized): Secondary | ICD-10-CM
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
## 2022-11-07 ENCOUNTER — Other Ambulatory Visit: Payer: Self-pay | Admitting: Emergency Medicine

## 2022-11-07 DIAGNOSIS — J449 Chronic obstructive pulmonary disease, unspecified: Secondary | ICD-10-CM

## 2022-11-08 ENCOUNTER — Ambulatory Visit: Payer: Medicare HMO | Admitting: Physical Therapy

## 2022-11-09 ENCOUNTER — Ambulatory Visit: Payer: Medicare HMO

## 2022-11-09 DIAGNOSIS — M25512 Pain in left shoulder: Secondary | ICD-10-CM | POA: Diagnosis not present

## 2022-11-09 DIAGNOSIS — M6281 Muscle weakness (generalized): Secondary | ICD-10-CM | POA: Diagnosis not present

## 2022-11-09 DIAGNOSIS — R600 Localized edema: Secondary | ICD-10-CM
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
## 2022-11-10 ENCOUNTER — Encounter: Payer: Medicare HMO | Admitting: Physical Therapy

## 2022-11-15 ENCOUNTER — Ambulatory Visit: Payer: Medicare HMO

## 2022-11-15 DIAGNOSIS — M6281 Muscle weakness (generalized): Secondary | ICD-10-CM | POA: Diagnosis not present

## 2022-11-15 DIAGNOSIS — M25512 Pain in left shoulder: Secondary | ICD-10-CM | POA: Diagnosis not present

## 2022-11-15 DIAGNOSIS — R600 Localized edema: Secondary | ICD-10-CM
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
## 2022-11-23 ENCOUNTER — Telehealth: Payer: Self-pay | Admitting: *Deleted

## 2022-11-23 ENCOUNTER — Ambulatory Visit (INDEPENDENT_AMBULATORY_CARE_PROVIDER_SITE_OTHER): Payer: Medicare HMO | Admitting: Surgical

## 2022-11-23 ENCOUNTER — Encounter: Payer: Self-pay | Admitting: Orthopedic Surgery

## 2022-11-23 DIAGNOSIS — Z96612 Presence of left artificial shoulder joint: Secondary | ICD-10-CM
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
## 2022-11-23 NOTE — Telephone Encounter (Signed)
Ortho bundle in office meeting completed today. No further CM needs. Patient reports SANE question as 90-95% improvement in all symptoms and feels his shoulder is almost normal.

## 2022-12-07 ENCOUNTER — Other Ambulatory Visit: Payer: Self-pay | Admitting: Emergency Medicine

## 2022-12-07 DIAGNOSIS — I10 Essential (primary) hypertension: Secondary | ICD-10-CM

## 2022-12-07 DIAGNOSIS — J449 Chronic obstructive pulmonary disease, unspecified: Secondary | ICD-10-CM

## 2022-12-28 ENCOUNTER — Ambulatory Visit (INDEPENDENT_AMBULATORY_CARE_PROVIDER_SITE_OTHER): Payer: Medicare HMO

## 2022-12-28 ENCOUNTER — Other Ambulatory Visit (INDEPENDENT_AMBULATORY_CARE_PROVIDER_SITE_OTHER): Payer: Medicare HMO

## 2022-12-28 ENCOUNTER — Ambulatory Visit (INDEPENDENT_AMBULATORY_CARE_PROVIDER_SITE_OTHER): Payer: Medicare HMO | Admitting: Orthopedic Surgery

## 2022-12-28 ENCOUNTER — Ambulatory Visit: Payer: Medicare HMO | Admitting: Emergency Medicine

## 2022-12-28 DIAGNOSIS — D170 Benign lipomatous neoplasm of skin and subcutaneous tissue of head, face and neck: Secondary | ICD-10-CM

## 2022-12-28 DIAGNOSIS — M542 Cervicalgia: Secondary | ICD-10-CM

## 2022-12-28 DIAGNOSIS — Z0000 Encounter for general adult medical examination without abnormal findings: Secondary | ICD-10-CM | POA: Diagnosis not present

## 2022-12-28 MED ORDER — TRAMADOL HCL 50 MG PO TABS: ORAL_TABLET | ORAL | 0 refills | Status: DC
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
## 2022-12-28 NOTE — Patient Instructions (Signed)
Health Maintenance, Male Adopting a healthy lifestyle and getting preventive care are important in promoting health and wellness. Ask your health care provider about: The right schedule for you to have regular tests and exams. Things you can do on your own to prevent diseases and keep yourself healthy. What should I know about diet, weight, and exercise? Eat a healthy diet  Eat a diet that includes plenty of vegetables, fruits, low-fat dairy products, and lean protein. Do not eat a lot of foods that are high in solid fats, added sugars, or sodium. Maintain a healthy weight Body mass index (BMI) is a measurement that can be used to identify possible weight problems. It estimates body fat based on height and weight. Your health care provider can help determine your BMI and help you achieve or maintain a healthy weight. Get regular exercise Get regular exercise. This is one of the most important things you can do for your health. Most adults should: Exercise for at least 150 minutes each week. The exercise should increase your heart rate and make you sweat (moderate-intensity exercise). Do strengthening exercises at least twice a week. This is in addition to the moderate-intensity exercise. Spend less time sitting. Even light physical activity can be beneficial. Watch cholesterol and blood lipids Have your blood tested for lipids and cholesterol at 67 years of age, then have this test every 5 years. You may need to have your cholesterol levels checked more often if: Your lipid or cholesterol levels are high. You are older than 67 years of age. You are at high risk for heart disease. What should I know about cancer screening? Many types of cancers can be detected early and may often be prevented. Depending on your health history and family history, you may need to have cancer screening at various ages. This may include screening for: Colorectal cancer. Prostate cancer. Skin cancer. Lung  cancer. What should I know about heart disease, diabetes, and high blood pressure? Blood pressure and heart disease High blood pressure causes heart disease and increases the risk of stroke. This is more likely to develop in people who have high blood pressure readings or are overweight. Talk with your health care provider about your target blood pressure readings. Have your blood pressure checked: Every 3-5 years if you are 18-39 years of age. Every year if you are 40 years old or older. If you are between the ages of 65 and 75 and are a current or former smoker, ask your health care provider if you should have a one-time screening for abdominal aortic aneurysm (AAA). Diabetes Have regular diabetes screenings. This checks your fasting blood sugar level. Have the screening done: Once every three years after age 45 if you are at a normal weight and have a low risk for diabetes. More often and at a younger age if you are overweight or have a high risk for diabetes. What should I know about preventing infection? Hepatitis B If you have a higher risk for hepatitis B, you should be screened for this virus. Talk with your health care provider to find out if you are at risk for hepatitis B infection. Hepatitis C Blood testing is recommended for: Everyone born from 1945 through 1965. Anyone with known risk factors for hepatitis C. Sexually transmitted infections (STIs) You should be screened each year for STIs, including gonorrhea and chlamydia, if: You are sexually active and are younger than 67 years of age. You are older than 67 years of age and your   health care provider tells you that you are at risk for this type of infection. Your sexual activity has changed since you were last screened, and you are at increased risk for chlamydia or gonorrhea. Ask your health care provider if you are at risk. Ask your health care provider about whether you are at high risk for HIV. Your health care provider  may recommend a prescription medicine to help prevent HIV infection. If you choose to take medicine to prevent HIV, you should first get tested for HIV. You should then be tested every 3 months for as long as you are taking the medicine. Follow these instructions at home: Alcohol use Do not drink alcohol if your health care provider tells you not to drink. If you drink alcohol: Limit how much you have to 0-2 drinks a day. Know how much alcohol is in your drink. In the [place] one drink equals one 12 oz bottle of beer (355 mL), one 5 oz glass of wine (148 mL), or one 1 oz glass of hard liquor (44 mL). Lifestyle Do not use any products that contain nicotine or tobacco. These products include cigarettes, chewing tobacco, and vaping devices, such as e-cigarettes. If you need help quitting, ask your health care provider. Do not use street drugs. Do not share needles. Ask your health care provider for help if you need support or information about quitting drugs. General instructions Schedule regular health, dental, and eye exams. Stay current with your vaccines. Tell your health care provider if: You often feel depressed. You have ever been abused or do not feel safe at home. Summary Adopting a healthy lifestyle and getting preventive care are important in promoting health and wellness. Follow your health care provider's instructions about healthy diet, exercising, and getting tested or screened for diseases. Follow your health care provider's instructions on monitoring your cholesterol and blood pressure. This information is not intended to replace advice given to you by your health care provider. Make sure you discuss any questions you have with your health care provider. Document Revised: 11/09/2020 Document Reviewed: 11/09/2020 Elsevier Patient Education  2024 [employer]  
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
## 2022-12-29 ENCOUNTER — Ambulatory Visit (INDEPENDENT_AMBULATORY_CARE_PROVIDER_SITE_OTHER): Payer: Medicare HMO | Admitting: Emergency Medicine

## 2022-12-29 ENCOUNTER — Encounter: Payer: Self-pay | Admitting: Orthopedic Surgery

## 2022-12-29 ENCOUNTER — Encounter: Payer: Self-pay | Admitting: Emergency Medicine

## 2022-12-29 VITALS — BP 122/80 | HR 94 | Temp 98.5°F | Ht 68.0 in | Wt 165.2 lb

## 2022-12-29 DIAGNOSIS — E785 Hyperlipidemia, unspecified: Secondary | ICD-10-CM

## 2022-12-29 DIAGNOSIS — E1169 Type 2 diabetes mellitus with other specified complication: Secondary | ICD-10-CM | POA: Diagnosis not present

## 2022-12-29 DIAGNOSIS — I2510 Atherosclerotic heart disease of native coronary artery without angina pectoris: Secondary | ICD-10-CM

## 2022-12-29 DIAGNOSIS — J439 Emphysema, unspecified: Secondary | ICD-10-CM

## 2022-12-29 DIAGNOSIS — E1159 Type 2 diabetes mellitus with other circulatory complications: Secondary | ICD-10-CM | POA: Diagnosis not present

## 2022-12-29 DIAGNOSIS — I152 Hypertension secondary to endocrine disorders: Secondary | ICD-10-CM

## 2022-12-29 DIAGNOSIS — N5201 Erectile dysfunction due to arterial insufficiency: Secondary | ICD-10-CM

## 2022-12-29 DIAGNOSIS — Z23 Encounter for immunization: Secondary | ICD-10-CM | POA: Diagnosis not present

## 2022-12-29 LAB — LIPID PANEL
Cholesterol: 151 mg/dL (ref 0–200)
HDL: 57.6 mg/dL (ref >39.00)
LDL Cholesterol: 80 mg/dL (ref 0–99)
NonHDL: 93.58
Total CHOL/HDL Ratio: 3
Triglycerides: 70 mg/dL (ref 0.0–149.0)
VLDL: 14 mg/dL (ref 0.0–40.0)

## 2022-12-29 LAB — CBC WITH DIFFERENTIAL/PLATELET
Basophils Absolute: 0.1 10*3/uL (ref 0.0–0.1)
Basophils Relative: 0.8 % (ref 0.0–3.0)
Eosinophils Absolute: 0.1 10*3/uL (ref 0.0–0.7)
Eosinophils Relative: 1.4 % (ref 0.0–5.0)
HCT: 52.6 % — ABNORMAL HIGH (ref 39.0–52.0)
Hemoglobin: 17.3 g/dL — ABNORMAL HIGH (ref 13.0–17.0)
Lymphocytes Relative: 14 % (ref 12.0–46.0)
Lymphs Abs: 1.1 10*3/uL (ref 0.7–4.0)
MCHC: 32.9 g/dL (ref 30.0–36.0)
MCV: 94.3 fl (ref 78.0–100.0)
Monocytes Absolute: 0.6 10*3/uL (ref 0.1–1.0)
Monocytes Relative: 7.6 % (ref 3.0–12.0)
Neutro Abs: 6.1 10*3/uL (ref 1.4–7.7)
Neutrophils Relative %: 76.2 % (ref 43.0–77.0)
Platelets: 258 10*3/uL (ref 150.0–400.0)
RBC: 5.58 Mil/uL (ref 4.22–5.81)
RDW: 14.3 % (ref 11.5–15.5)
WBC: 8 10*3/uL (ref 4.0–10.5)

## 2022-12-29 LAB — COMPREHENSIVE METABOLIC PANEL
ALT: 19 U/L (ref 0–53)
AST: 18 U/L (ref 0–37)
Albumin: 4.4 g/dL (ref 3.5–5.2)
Alkaline Phosphatase: 97 U/L (ref 39–117)
BUN: 13 mg/dL (ref 6–23)
CO2: 31 mEq/L (ref 19–32)
Calcium: 9.8 mg/dL (ref 8.4–10.5)
Chloride: 102 mEq/L (ref 96–112)
Creatinine, Ser: 0.92 mg/dL (ref 0.40–1.50)
GFR: 86.17 mL/min (ref >60.00)
Glucose, Bld: 202 mg/dL — ABNORMAL HIGH (ref 70–99)
Potassium: 4.2 mEq/L (ref 3.5–5.1)
Sodium: 140 mEq/L (ref 135–145)
Total Bilirubin: 1.1 mg/dL (ref 0.2–1.2)
Total Protein: 7.8 g/dL (ref 6.0–8.3)

## 2022-12-29 LAB — POCT GLYCOSYLATED HEMOGLOBIN (HGB A1C): Hemoglobin A1C: 6.6 % — AB (ref 4.0–5.6)

## 2022-12-29 LAB — MICROALBUMIN / CREATININE URINE RATIO
Creatinine,U: 73.4 mg/dL
Microalb Creat Ratio: 17.7 mg/g (ref 0.0–30.0)
Microalb, Ur: 13 mg/dL — ABNORMAL HIGH (ref 0.0–1.9)

## 2022-12-29 MED ORDER — SILDENAFIL CITRATE 100 MG PO TABS: 50.0000 mg | ORAL_TABLET | Freq: Every day | ORAL | 11 refills | Status: AC | PRN

## 2022-12-29 MED ORDER — ONETOUCH ULTRA 2 W/DEVICE KIT: PACK | 0 refills | Status: DC

## 2022-12-29 NOTE — Assessment & Plan Note (Signed)
Stable chronic condition Well-controlled diabetes with hemoglobin A1c of 6.6 Continue atorvastatin 20 mg daily

## 2022-12-29 NOTE — Patient Instructions (Signed)
Diabetes mellitus y nutricin, en adultos Diabetes Mellitus and Nutrition, Adult Si sufre de diabetes, o diabetes mellitus, es muy importante tener hbitos alimenticios saludables debido a que sus niveles de azcar en la sangre (glucosa) se ven afectados en gran medida por lo que come y bebe. Comer alimentos saludables en las cantidades correctas, aproximadamente a la misma hora todos los das, lo ayudar a: Controlar su glucemia. Disminuir el riesgo de sufrir una enfermedad cardaca. Mejorar la presin arterial. Alcanzar o mantener un peso saludable. Qu puede afectar mi plan de alimentacin? Todas las personas que sufren de diabetes son diferentes y cada una tiene necesidades diferentes en cuanto a un plan de alimentacin. El mdico puede recomendarle que trabaje con un nutricionista para elaborar el mejor plan para usted. Su plan de alimentacin puede variar segn factores como: Las caloras que necesita. Los medicamentos que toma. Su peso. Sus niveles de glucemia, presin arterial y colesterol. Su nivel de actividad. Otras afecciones que tenga, como enfermedades cardacas o renales. Cmo me afectan los carbohidratos? Los carbohidratos, o hidratos de carbono, afectan su nivel de glucemia ms que cualquier otro tipo de alimento. La ingesta de carbohidratos aumenta la cantidad de glucosa en la sangre. Es importante conocer la cantidad de carbohidratos que se pueden ingerir en cada comida sin correr ningn riesgo. Esto es diferente en cada persona. Su nutricionista puede ayudarlo a calcular la cantidad de carbohidratos que debe ingerir en cada comida y en cada refrigerio. Cmo me afecta el alcohol? El alcohol puede provocar una disminucin de la glucemia (hipoglucemia), especialmente si usa insulina o toma determinados medicamentos por va oral para la diabetes. La hipoglucemia es una afeccin potencialmente mortal. Los sntomas de la hipoglucemia, como somnolencia, mareos y confusin, son  similares a los sntomas de haber consumido demasiado alcohol. No beba alcohol si: Su mdico le indica no hacerlo. Est embarazada, puede estar embarazada o est tratando de quedar embarazada. Si bebe alcohol: Limite la cantidad que bebe a lo siguiente: De 0 a 1 medida por da para las mujeres. De 0 a 2 medidas por da para los hombres. Sepa cunta cantidad de alcohol hay en las bebidas que toma. En [place], una medida equivale a una botella de cerveza de 12 oz (355 ml), un vaso de vino de 5 oz (148 ml) o un vaso de una bebida alcohlica de alta graduacin de 1 oz (44 ml). Mantngase hidratado bebiendo agua, refrescos dietticos o t helado sin azcar. Tenga en cuenta que los refrescos comunes, los jugos y otras bebidas para mezclar pueden contener mucha azcar y se deben contar como carbohidratos. Consejos para seguir este plan  Leer las etiquetas de los alimentos Comience por leer el tamao de la porcin en la etiqueta de Informacin nutricional de los alimentos envasados y las bebidas. La cantidad de caloras, carbohidratos, grasas y otros nutrientes detallados en la etiqueta se basan en una porcin del alimento. Muchos alimentos contienen ms de una porcin por envase. Verifique la cantidad total de gramos (g) de carbohidratos totales en una porcin. Verifique la cantidad de gramos de grasas saturadas y grasas trans en una porcin. Escoja alimentos que no contengan estas grasas o que su contenido de estas sea bajo. Verifique la cantidad de miligramos (mg) de sal (sodio) en una porcin. La mayora de las personas deben limitar la ingesta de sodio total a menos de 2300 mg por da. Siempre consulte la informacin nutricional de los alimentos etiquetados como "con bajo contenido de grasa" o "sin grasa".   Estos alimentos pueden tener un mayor contenido de azcar agregada o carbohidratos refinados, y deben evitarse. Hable con su nutricionista para identificar sus objetivos diarios en  cuanto a los nutrientes mencionados en la etiqueta. Al ir de compras Evite comprar alimentos procesados, enlatados o precocidos. Estos alimentos tienden a tener una mayor cantidad de grasa, sodio y azcar agregada. Compre en la zona exterior de la tienda de comestibles. Esta es la zona donde se encuentran con mayor frecuencia las frutas y las verduras frescas, los cereales a granel, las carnes frescas y los productos lcteos frescos. Al cocinar Use mtodos de coccin a baja temperatura, como hornear, en lugar de mtodos de coccin a alta temperatura, como frer en abundante aceite. Cocine con aceites saludables, como el aceite de oliva, canola o girasol. Evite cocinar con manteca, crema o carnes con alto contenido de grasa. Planificacin de las comidas Coma las comidas y los refrigerios regularmente, preferentemente a la misma hora todos los das. Evite pasar largos perodos de tiempo sin comer. Consuma alimentos ricos en fibra, como frutas frescas, verduras, frijoles y cereales integrales. Consuma entre 4 y 6 onzas (entre 112 y 168 g) de protenas magras por da, como carnes magras, pollo, pescado, huevos o tofu. Una onza (oz) (28 g) de protena magra equivale a: 1 onza (28 g) de carne, pollo o pescado. 1 huevo.  taza (62 g) de tofu. Coma algunos alimentos por da que contengan grasas saludables, como aguacates, frutos secos, semillas y pescado. Qu alimentos debo comer? Frutas Bayas. Manzanas. Naranjas. Duraznos. Damascos. Ciruelas. Uvas. Mangos. Papayas. Granadas. Kiwi. Cerezas. Verduras Verduras de hoja verde, que incluyen lechuga, espinaca, col rizada, acelga, hojas de berza, hojas de mostaza y repollo. Remolachas. Coliflor. Brcoli. Zanahorias. Judas verdes. Tomates. Pimientos. Cebollas. Pepinos. Coles de Bruselas. Granos Granos integrales, como panes, galletas, tortillas, cereales y pastas de salvado o integrales. Avena sin azcar. Quinua. Arroz integral o salvaje. Carnes y otras  protenas Frutos de mar. Carne de ave sin piel. Cortes magros de ave y carne de res. Tofu. Frutos secos. Semillas. Lcteos Productos lcteos sin grasa o con bajo contenido de grasa, como leche, yogur y queso. Es posible que los productos detallados arriba no constituyan una lista completa de los alimentos y las bebidas que puede tomar. Consulte a un nutricionista para obtener ms informacin. Qu alimentos debo evitar? Frutas Frutas enlatadas al almbar. Verduras Verduras enlatadas. Verduras congeladas con mantequilla o salsa de crema. Granos Productos elaborados con harina y harina blanca refinada, como panes, pastas, bocadillos y cereales. Evite todos los alimentos procesados. Carnes y otras protenas Cortes de carne con alto contenido de grasa. Carne de ave con piel. Carnes empanizadas o fritas. Carne procesada. Evite las grasas saturadas. Lcteos Yogur, queso o leche enteros. Bebidas Bebidas azucaradas, como gaseosas o t helado. Es posible que los productos que se enumeran ms arriba no constituyan una lista completa de los alimentos y las bebidas que debe evitar. Consulte a un nutricionista para obtener ms informacin. Preguntas para hacerle al mdico Debo consultar con un especialista certificado en atencin y educacin sobre la diabetes? Es necesario que me rena con un nutricionista? A qu nmero puedo llamar si tengo preguntas? Cules son los mejores momentos para controlar la glucemia? Dnde encontrar ms informacin: American Diabetes Association (Asociacin Estadounidense de la Diabetes): diabetes.org Academy of Nutrition and Dietetics (Academia de Nutricin y Diettica): eatright.org National Institute of Diabetes and Digestive and Kidney Diseases (Instituto Nacional de la Diabetes y las Enfermedades Digestivas y Renales): niddk.nih.gov Association of Diabetes   Care & Education Specialists (Asociacin de Especialistas en Atencin y Educacin sobre la Diabetes):  diabeteseducator.org Resumen Es importante tener hbitos alimenticios saludables debido a que sus niveles de azcar en la sangre (glucosa) se ven afectados en gran medida por lo que come y bebe. Es importante consumir alcohol con prudencia. Un plan de comidas saludable lo ayudar a controlar la glucosa en sangre y a reducir el riesgo de enfermedades cardacas. El mdico puede recomendarle que trabaje con un nutricionista para elaborar el mejor plan para usted. Esta informacin no tiene como fin reemplazar el consejo del mdico. Asegrese de hacerle al mdico cualquier pregunta que tenga. Document Revised: 02/26/2020 Document Reviewed: 02/26/2020 Elsevier Patient Education  2024 [employer].  

## 2022-12-29 NOTE — Assessment & Plan Note (Signed)
Stable.  Continues Breztri 2 puffs twice a day and albuterol as rescue inhaler as needed

## 2022-12-29 NOTE — Assessment & Plan Note (Signed)
BP Readings from Last 3 Encounters:  12/29/22 122/80  09/21/22 118/84  08/31/22 107/65  Well-controlled hypertension Continue amlodipine 5 mg daily Well-controlled diabetes with hemoglobin A1c of 6.6 Continues Tresiba 30 units daily and Premeal aspart insulin as per sliding scale Continues weekly Trulicity 1.5 mg Continues daily Farxiga 10 mg and glipizide 10 mg daily No hypoglycemia episodes Diet and nutrition discussed Cardiovascular risks associated with hypertension and diabetes discussed
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
## 2022-12-29 NOTE — Assessment & Plan Note (Signed)
No anginal episodes.  Stable.  Continues daily baby aspirin

## 2023-01-02 ENCOUNTER — Other Ambulatory Visit: Payer: Self-pay | Admitting: Emergency Medicine

## 2023-01-02 DIAGNOSIS — E1165 Type 2 diabetes mellitus with hyperglycemia: Secondary | ICD-10-CM

## 2023-01-09 ENCOUNTER — Other Ambulatory Visit: Payer: Self-pay | Admitting: Emergency Medicine

## 2023-01-31 ENCOUNTER — Ambulatory Visit: Payer: Self-pay | Admitting: Surgery

## 2023-01-31 DIAGNOSIS — D210 Benign neoplasm of connective and other soft tissue of head, face and neck: Secondary | ICD-10-CM | POA: Diagnosis not present

## 2023-02-06 ENCOUNTER — Other Ambulatory Visit: Payer: Self-pay | Admitting: Emergency Medicine

## 2023-02-27 ENCOUNTER — Encounter (HOSPITAL_BASED_OUTPATIENT_CLINIC_OR_DEPARTMENT_OTHER): Payer: Self-pay | Admitting: Surgery

## 2023-02-27 NOTE — Progress Notes (Signed)
Spoke w/ via phone for pre-op interview---  pt's daughter, angelica, whom speaks english Lab needs dos---- [employer] results------ current EKG in epic/ chart /  chest CT in epic 03-19-2022 COVID test -----patient states asymptomatic no test needed Arrive at ------- 0900 on 03-09-2023 NPO after MN NO Solid Food.  Clear liquids from MN until--- 0800 Med rec completed  Medications to take morning of surgery ----- gabapentin, lipitor, norvasc, protonix, if needed take ubrelvy, do breztri inhaler and do proventil nebulizer Diabetic medication ----- do no take farxiga/ glipizide morning of surgery.  Do not do novolog insulin morning of surgery.  Do half dose tresiba morning of surgery.  Last dose of trulicity was 02-23-2023, daughter aware not to have pt do 03-02-2023 prior to surgery and not do tresiba until after surgery  Patient instructed to bring photo id and insurance card day of surgery Patient aware to have Driver (ride ) / caregiver    for 24 hours after surgery -- daughter, angelica Patient Special Instructions ----- verbalized understanding of diabetic instructions for morning surgery and trulicity instsructions Pre-Op special Instructions -----  pt daughter stated pt would need interpreter dos.  Sent request via email to [place] interpreting, copy w/ chart  Patient verbalized understanding of instructions that were given at this phone interview. Patient denies shortness of breath, chest pain, fever, cough at this phone interview.  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 Alvy Bimler  (lov 12-29-2022) Neurosurgeon:  Dr Dorris Carnes.  Nundkumar Robbie Lis neurosurgeon) Chest xray:  CT chest/ abd  03-19-2022 EKG:  03-18-2022 Echo:  04-28-2020 Stress test:  nuclear 11-02-2019 CTA cardiac:  09-15-2020 Cath:  none Fasting blood surgery:  118-120/  check blood surgery--- daily in am fasting Blood thinner/ instructions/ last dose:  no ASA/ instructions/ last dose:  ASA 81mg  Per Dr Gerrit Friends orders pt is to stop.  Advised pt daughter per Dr Gerrit Friends pt is to stop his ASA prior surgery to check instructions from office to see if 5 or 7 days.  Daughter verbalized understanding.

## 2023-03-05 ENCOUNTER — Encounter (HOSPITAL_COMMUNITY): Payer: Self-pay | Admitting: Surgery

## 2023-03-05 DIAGNOSIS — M7989 Other specified soft tissue disorders: Secondary | ICD-10-CM | POA: Diagnosis present

## 2023-03-05 NOTE — H&P (Signed)
REFERRING PHYSICIAN: Cammy Copa, MD  PROVIDER: Darryll Raju Myra Rude, MD   Chief Complaint: New Consultation (Soft tissue mass of neck)  History of Present Illness:  Patient is referred furred by his orthopedic surgeon, Dr. Dorene Grebe, for surgical evaluation and management of an enlarging soft tissue mass on the left posterior neck. Patient has undergone a left total shoulder replacement. Patient has a soft tissue mass in the left posterior neck at the edge of the trapezius muscle which has been present for approximately 1 year. It is gradually increasing in size. It is tender to palpation. Patient notes pain in the left neck radiating into the posterior head. Patient has had no specific imaging studies performed. He has had no prior surgery on this lesion. He has had no other such lesions removed in the past. He presents today accompanied by his family to discuss surgical excision.  Review of Systems: A complete review of systems was obtained from the patient. I have reviewed this information and discussed as appropriate with the patient. See HPI as well for other ROS.  Review of Systems  Constitutional: Negative.  HENT: Negative.  Eyes: Negative.  Respiratory: Negative.  Cardiovascular: Negative.  Gastrointestinal: Negative.  Genitourinary: Negative.  Musculoskeletal: Positive for neck pain.  Skin:  Soft tissue mass neck  Neurological: Negative.  Endo/Heme/Allergies: Negative.  Psychiatric/Behavioral: Negative.    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, face, and neck   Past Surgical History:  Procedure Laterality Date  TOTAL SHOULDER  REPLACEMENT    No Known Allergies  Current Outpatient Medications on File Prior to Visit  Medication Sig Dispense Refill  amLODIPine (NORVASC) 5 MG tablet TOMAR 1 TABLETA POR VAI ORAL UNA VEZ AL DIA  aspirin 81 MG EC tablet Take 81 mg by mouth once daily  atorvastatin (LIPITOR) 20 MG tablet TOMAR UNA (1) TABLETA POR VIA ORAL UNA VEZ AL DIA  FARXIGA 10 mg tablet TOMAR 1 TABLETA POR VIA ORAL UNA VEZ AL DIA  glipiZIDE (GLUCOTROL) 10 MG tablet TOMAR 1 TABLETA POR VIA ORAL UNA VEZ AL DIA ANTES DEL DESAYUNO *TOMAR 1 TABLETA ADICIONAL SI AZUCAR EN LA SANGRE ESTA ALTA*  pantoprazole (PROTONIX) 40 MG DR tablet TOMAR 1 TABLETA POR VIA ORAL UNA VEZ AL DIA  TRESIBA FLEXTOUCH U-100 pen injector (concentration 100 units/mL) INYECTAR 25 UNIDADES POR VIA SUBCUTANEA DIARIAMENTE  TRULICITY 1.5 mg/0.5 mL subcutaneous pen injector INYECTAR CONTENIDOS DE UN LAPIZ POR VIA SUBCUTANEA CADA SEMANA, EL MISMO DIA CADA SEMANA   No current facility-administered medications on file prior to visit.   Family History  Problem Relation Age of Onset  Diabetes Mother  Breast cancer Sister  Diabetes Sister  Diabetes Brother    Social History   Tobacco Use  Smoking Status Former  Types: Cigarettes  Smokeless Tobacco Never    Social History   Socioeconomic History  Marital status: Single  Tobacco Use  Smoking status: Former  Types: Cigarettes  Smokeless tobacco: Never  Vaping Use  Vaping status: Never Used  Substance and Sexual Activity  Alcohol use: Yes  Drug use: Never   Social Determinants of Health   Financial Resource Strain: Low Risk (12/28/2022)  Received from [facility]  Overall Financial Resource Strain (CARDIA)  Difficulty  of Paying Living Expenses: Not hard at all  Food Insecurity: No Food Insecurity (12/28/2022)  Received from [facility]  Hunger Vital Sign  Worried About Running Out of Food in the Last Year: Never true  Ran Out of Food in the Last Year: Never true  Transportation  Needs: No Transportation Needs (12/28/2022)  [facility] from [facility] - Transportation  Lack of Transportation (Medical): No  Lack of Transportation (Non-Medical): No  Physical Activity: Sufficiently Active (12/28/2022)  Received from [facility]  Exercise Vital Sign  Days of Exercise per Week: 7 days  Minutes of Exercise per Session: 30 min  Stress: No Stress Concern Present (12/28/2022)  Received from [facility] - Occupational Stress Questionnaire  Feeling of Stress : Not at all  Social Connections: Socially Integrated (12/28/2022)  Received from [facility]  Social Connection and Isolation Panel [NHANES]  Frequency of Communication with Friends and Family: More than three times a week  Frequency of Social Gatherings with Friends and Family: More than three times a week  Attends Religious Services: More than 4 times per year  Active Member of [employer] or Organizations: Yes  Attends Engineer, structural: More than 4 times per year  Marital Status: Married   Objective:   Vitals:   BP: 110/70  Pulse: 98  Temp: 36.9 C (98.5 F)  SpO2: 93%  Weight: 74.1 kg (163 lb 6.4 oz)  Height: 172.7 cm (5\' 8" )  PainSc: 0-No pain   Body mass index is 24.84 kg/m.  Physical Exam   GENERAL APPEARANCE Comfortable, no acute issues Development: normal Gross deformities: none  SKIN Rash, lesions, ulcers: none Induration, erythema: none Nodules: none palpable  EYES Conjunctiva and lids: normal Pupils: equal and reactive  EARS, NOSE, MOUTH, THROAT External ears: no lesion or deformity External nose: no lesion or deformity Hearing: grossly normal  NECK Soft tissue mass in the left posterior neck at the border of the trapezius. This measures approximately 5 cm in greatest dimension. It has the consistency of a lipoma and appears well-defined and mobile. However it is exquisitely tender to palpation. There are no overlying  cutaneous changes.  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 This has been present for approximately 1 year and gradually increasing in size. It is tender to palpation and causes pain radiating into the left side of the neck and posterior head. Patient presents today to discuss surgical excision.  We discussed performing an outpatient surgical procedure under anesthesia for excision of the soft tissue mass from the left posterior neck. We discussed the size and location of the incision. We discussed postoperative care. We will submit the specimen for pathologic review. The patient and his family understand and agree to proceed.  Darnell Level, MD [facility] A [facility] practice Office: [phone]

## 2023-03-07 NOTE — Progress Notes (Addendum)
Anesthesia Review:  PCP: [facility]- 12/29/22- LOV  Cardiologist : none  Chest x-ray : 03/18/22- 2 view  EKG : 03/21/22  Echo : 04/28/20  Stress test: 11/02/19  Cardiac Cath :  CT [facility]- 2022  Activity level: can do a flight of stairs without difficulty  Sleep Study/ CPAP : none  Fasting Blood Sugar :      / Checks Blood Sugar -- times a day:   Blood Thinner/ Instructions /Last Dose: ASA / Instructions/ Last Dose :    81 mg aspirin     DM- type 2 - checks glucose every am per daughter  Hgba1c-03/09/23-  Hgba1c- 12/29/22- 6.6    Evaristo Bury- Daughter reports at preop pt takes at [employer].  PT to take 1/2 dose pm of 03/08/23.  Trulicity-  Last dose on 03/02/23  per daughter  Marcelline Deist-  last dose on 03/07/23 per daughter Glucotrol-  none on 03/09/23  Novolog SS with meals  - NO Novolog on am of 03/09/23  Jacobo Forest aware of above with Diabetic Meds.  On 03/08/23.     PT speaks Spanish.  Daughter, [name]- English.   Interpreter requested for DOS - dtr aware.    Smoker- smokes 2 cigarettes per day per daughter    Hx of COPD Uses oxygen 2l/[place] at nite as needed per daughter, [name]  Daughter, [name]  speaks [place].  PT speaks and understands a small amt of [place].  Preop nurse completed med hx and preop instructions with daughter , Angelicavia telephone appt on 03/08/23.  Daughter aware to go and purchase a bar of Dial soap which is antibacterial for the showers.  Daughter, [name], voiced understanding of preop instructions.

## 2023-03-07 NOTE — Patient Instructions (Signed)
SURGICAL WAITING ROOM VISITATION  Patients having surgery or a procedure may have no more than 2 support people in the waiting area - these visitors may rotate.    Children under the age of 24 must have an adult with them who is not the patient.  Due to an increase in RSV and influenza rates and associated hospitalizations, children ages 31 and under may not visit patients in [facility] hospitals.  If the patient needs to stay at the hospital during part of their recovery, the visitor guidelines for inpatient rooms apply. Pre-op nurse will coordinate an appropriate time for 1 support person to accompany patient in pre-op.  This support person may not rotate.    Please refer to the [facility] website for the visitor guidelines for Inpatients (after your surgery is over and you are in a regular room).       Your procedure is scheduled on:  03/09/2023    Report to [facility] Main Entrance    Report to admitting at   0900 AM   Call this number if you have problems the morning of surgery [phone]   Do not eat food :After Midnight.   After Midnight you may have the following liquids until __ 0800____ AM DAY OF SURGERY  Water Non-Citrus Juices (without pulp, NO RED-Apple, White grape, White cranberry) Black Coffee (NO MILK/CREAM OR CREAMERS, sugar ok)  Clear Tea (NO MILK/CREAM OR CREAMERS, sugar ok) regular and decaf                             Plain Jell-O (NO RED)                                           Fruit ices (not with fruit pulp, NO RED)                                     Popsicles (NO RED)                                                               Sports drinks like Gatorade (NO RED)                       If you have questions, please contact your surgeon's office.   FOLLOW BOWEL PREP AND ANY ADDITIONAL PRE OP INSTRUCTIONS YOU RECEIVED FROM YOUR SURGEON'S OFFICE!!!     Oral Hygiene is also important to reduce your risk of infection.                                     Remember - BRUSH YOUR TEETH THE MORNING OF SURGERY WITH YOUR REGULAR TOOTHPASTE  DENTURES WILL BE REMOVED PRIOR TO SURGERY PLEASE DO NOT APPLY "Poly grip" OR ADHESIVES!!!   Do NOT smoke after Midnight   Stop all vitamins and herbal supplements 7 days before surgery.   Take these medicines the morning of surgery with A SIP OF  WATER:  nebulzier if needed, inhalers as usual and bring, amlodpine, protonix              Tresiba-             Trulicity-              Farxiga-              Glucotrol-              Novology SS with meals -   DO NOT TAKE ANY ORAL DIABETIC MEDICATIONS DAY OF YOUR SURGERY  Bring CPAP mask and tubing day of surgery.                              You may not have any metal on your body including hair pins, jewelry, and body piercing             Do not wear make-up, lotions, powders, perfumes/cologne, or deodorant  Do not wear nail polish including gel and S&S, artificial/acrylic nails, or any other type of covering on natural nails including finger and toenails. If you have artificial nails, gel coating, etc. that needs to be removed by a nail salon please have this removed prior to surgery or surgery may need to be canceled/ delayed if the surgeon/ anesthesia feels like they are unable to be safely monitored.   Do not shave  48 hours prior to surgery.               Men may shave face and neck.   Do not bring valuables to the hospital. [place] IS NOT             RESPONSIBLE   FOR VALUABLES.   Contacts, glasses, dentures or bridgework may not be worn into surgery.   Bring small overnight bag day of surgery.   DO NOT BRING YOUR HOME MEDICATIONS TO THE HOSPITAL. PHARMACY WILL DISPENSE MEDICATIONS LISTED ON YOUR MEDICATION LIST TO YOU DURING YOUR ADMISSION IN THE HOSPITAL!    Patients discharged on the day of surgery will not be allowed to drive home.  Someone NEEDS to stay with you for the first 24 hours after anesthesia.   Special  Instructions: Bring a copy of your healthcare power of attorney and living will documents the day of surgery if you haven't scanned them before.              Please read over the following fact sheets you were given: IF YOU HAVE QUESTIONS ABOUT YOUR PRE-OP INSTRUCTIONS PLEASE CALL [phone]   If you received a COVID test during your pre-op visit  it is requested that you wear a mask when out in public, stay away from anyone that may not be feeling well and notify your surgeon if you develop symptoms. If you test positive for Covid or have been in contact with anyone that has tested positive in the last 10 days please notify you surgeon.    [facility] - Preparing for Surgery Before surgery, you can play an important role.  Because skin is not sterile, your skin needs to be as free of germs as possible.  You can reduce the number of germs on your skin by washing with CHG (chlorahexidine gluconate) soap before surgery.  CHG is an antiseptic cleaner which kills germs and bonds with the skin to continue killing germs even after washing. Please DO NOT use if you have an allergy  to CHG or antibacterial soaps.  If your skin becomes reddened/irritated stop using the CHG and inform your nurse when you arrive at Short Stay. Do not shave (including legs and underarms) for at least 48 hours prior to the first CHG shower.  You may shave your face/neck. Please follow these instructions carefully:  1.  Shower with CHG Soap the night before surgery and the  morning of Surgery.  2.  If you choose to wash your hair, wash your hair first as usual with your  normal  shampoo.  3.  After you shampoo, rinse your hair and body thoroughly to remove the  shampoo.                           4.  Use CHG as you would any other liquid soap.  You can apply chg directly  to the skin and wash                       Gently with a scrungie or clean washcloth.  5.  Apply the CHG Soap to your body ONLY FROM THE NECK DOWN.   Do not use  on face/ open                           Wound or open sores. Avoid contact with eyes, ears mouth and genitals (private parts).                       Wash face,  Genitals (private parts) with your normal soap.             6.  Wash thoroughly, paying special attention to the area where your surgery  will be performed.  7.  Thoroughly rinse your body with warm water from the neck down.  8.  DO NOT shower/wash with your normal soap after using and rinsing off  the CHG Soap.                9.  Pat yourself dry with a clean towel.            10.  Wear clean pajamas.            11.  Place clean sheets on your bed the night of your first shower and do not  sleep with pets. Day of Surgery : Do not apply any lotions/deodorants the morning of surgery.  Please wear clean clothes to the hospital/surgery center.  FAILURE TO FOLLOW THESE INSTRUCTIONS MAY RESULT IN THE CANCELLATION OF YOUR SURGERY PATIENT SIGNATURE_________________________________  NURSE SIGNATURE__________________________________  ________________________________________________________________________

## 2023-03-08 ENCOUNTER — Inpatient Hospital Stay (HOSPITAL_COMMUNITY)
Admission: RE | Admit: 2023-03-08 | Discharge: 2023-03-08 | Disposition: A | Discharge status: Home / Self Care | Payer: Medicare HMO | Source: Ambulatory Visit

## 2023-03-08 ENCOUNTER — Encounter (HOSPITAL_COMMUNITY): Payer: Self-pay | Admitting: Surgery

## 2023-03-08 ENCOUNTER — Other Ambulatory Visit: Payer: Self-pay

## 2023-03-08 DIAGNOSIS — Z01818 Encounter for other preprocedural examination: Secondary | ICD-10-CM

## 2023-03-09 ENCOUNTER — Encounter (HOSPITAL_COMMUNITY): Payer: Self-pay | Admitting: Surgery

## 2023-03-09 ENCOUNTER — Ambulatory Visit (HOSPITAL_COMMUNITY)
Admission: RE | Admit: 2023-03-09 | Discharge: 2023-03-09 | Disposition: A | Discharge status: Home / Self Care | Payer: Medicare HMO | Attending: Surgery | Admitting: Surgery

## 2023-03-09 ENCOUNTER — Ambulatory Visit (HOSPITAL_BASED_OUTPATIENT_CLINIC_OR_DEPARTMENT_OTHER): Payer: Medicare HMO | Admitting: Anesthesiology

## 2023-03-09 ENCOUNTER — Encounter (HOSPITAL_COMMUNITY)
Admission: RE | Disposition: A | Discharge status: Home / Self Care | Payer: Self-pay | Source: Home / Self Care | Attending: Surgery

## 2023-03-09 ENCOUNTER — Ambulatory Visit (HOSPITAL_COMMUNITY): Payer: Medicare HMO | Admitting: Anesthesiology

## 2023-03-09 ENCOUNTER — Other Ambulatory Visit: Payer: Self-pay

## 2023-03-09 DIAGNOSIS — D170 Benign lipomatous neoplasm of skin and subcutaneous tissue of head, face and neck: Secondary | ICD-10-CM | POA: Diagnosis not present

## 2023-03-09 DIAGNOSIS — J4489 Other specified chronic obstructive pulmonary disease: Secondary | ICD-10-CM | POA: Diagnosis not present

## 2023-03-09 DIAGNOSIS — Z7985 Long-term (current) use of injectable non-insulin antidiabetic drugs: Secondary | ICD-10-CM | POA: Diagnosis not present

## 2023-03-09 DIAGNOSIS — M7989 Other specified soft tissue disorders: Secondary | ICD-10-CM

## 2023-03-09 DIAGNOSIS — F17210 Nicotine dependence, cigarettes, uncomplicated: Secondary | ICD-10-CM | POA: Diagnosis not present

## 2023-03-09 DIAGNOSIS — Z794 Long term (current) use of insulin: Secondary | ICD-10-CM | POA: Diagnosis not present

## 2023-03-09 DIAGNOSIS — Z7984 Long term (current) use of oral hypoglycemic drugs: Secondary | ICD-10-CM | POA: Diagnosis not present

## 2023-03-09 DIAGNOSIS — I10 Essential (primary) hypertension: Secondary | ICD-10-CM | POA: Insufficient documentation

## 2023-03-09 DIAGNOSIS — R221 Localized swelling, mass and lump, neck: Secondary | ICD-10-CM | POA: Diagnosis not present

## 2023-03-09 DIAGNOSIS — Z01818 Encounter for other preprocedural examination: Secondary | ICD-10-CM

## 2023-03-09 DIAGNOSIS — Z9981 Dependence on supplemental oxygen: Secondary | ICD-10-CM | POA: Insufficient documentation

## 2023-03-09 DIAGNOSIS — K219 Gastro-esophageal reflux disease without esophagitis: Secondary | ICD-10-CM | POA: Insufficient documentation

## 2023-03-09 DIAGNOSIS — E1165 Type 2 diabetes mellitus with hyperglycemia: Secondary | ICD-10-CM | POA: Diagnosis not present

## 2023-03-09 DIAGNOSIS — I2510 Atherosclerotic heart disease of native coronary artery without angina pectoris: Secondary | ICD-10-CM | POA: Diagnosis not present

## 2023-03-09 DIAGNOSIS — D210 Benign neoplasm of connective and other soft tissue of head, face and neck: Secondary | ICD-10-CM | POA: Diagnosis not present

## 2023-03-09 DIAGNOSIS — I252 Old myocardial infarction: Secondary | ICD-10-CM | POA: Insufficient documentation

## 2023-03-09 HISTORY — DX: Migraine, unspecified, not intractable, without status migrainosus: G43.909

## 2023-03-09 HISTORY — DX: Personal history of peptic ulcer disease: Z87.11

## 2023-03-09 HISTORY — DX: Male erectile dysfunction, unspecified: N52.9

## 2023-03-09 HISTORY — DX: Cerebral aneurysm, nonruptured: I67.1

## 2023-03-09 HISTORY — PX: EXCISION MASS NECK: SHX6703

## 2023-03-09 HISTORY — DX: Other forms of dyspnea: R06.09

## 2023-03-09 HISTORY — DX: Personal history of adenomatous and serrated colon polyps: Z86.0101

## 2023-03-09 HISTORY — DX: Complete loss of teeth, unspecified cause, unspecified class: K08.109

## 2023-03-09 HISTORY — DX: Personal history of colonic polyps: Z86.010

## 2023-03-09 HISTORY — DX: Personal history of (healed) traumatic fracture: Z87.81

## 2023-03-09 HISTORY — DX: Complete loss of teeth, unspecified cause, unspecified class: Z97.2

## 2023-03-09 HISTORY — DX: Simple chronic bronchitis: J41.0

## 2023-03-09 HISTORY — DX: Type 2 diabetes mellitus without complications: E11.9

## 2023-03-09 HISTORY — DX: Other cervical disc degeneration, unspecified cervical region: M50.30

## 2023-03-09 HISTORY — DX: Essential (primary) hypertension: I10

## 2023-03-09 HISTORY — DX: Old myocardial infarction: I25.2

## 2023-03-09 HISTORY — DX: Gastro-esophageal reflux disease without esophagitis: K21.9

## 2023-03-09 HISTORY — DX: Hyperlipidemia, unspecified: E78.5

## 2023-03-09 HISTORY — DX: Presence of spectacles and contact lenses: Z97.3

## 2023-03-09 HISTORY — DX: Diverticulosis of large intestine without perforation or abscess without bleeding: K57.30

## 2023-03-09 HISTORY — DX: Other intervertebral disc degeneration, lumbosacral region: M51.37

## 2023-03-09 HISTORY — DX: Dependence on supplemental oxygen: Z99.81

## 2023-03-09 HISTORY — DX: Long term (current) use of insulin: Z79.4

## 2023-03-09 HISTORY — DX: Atherosclerotic heart disease of native coronary artery without angina pectoris: I25.10

## 2023-03-09 LAB — CBC
HCT: 49.9 % (ref 39.0–52.0)
Hemoglobin: 16.6 g/dL (ref 13.0–17.0)
MCH: 30.7 pg (ref 26.0–34.0)
MCHC: 33.3 g/dL (ref 30.0–36.0)
MCV: 92.4 fL (ref 80.0–100.0)
Platelets: 229 10*3/uL (ref 150–400)
RBC: 5.4 MIL/uL (ref 4.22–5.81)
RDW: 13.6 % (ref 11.5–15.5)
WBC: 7.6 10*3/uL (ref 4.0–10.5)
nRBC: 0 % (ref 0.0–0.2)

## 2023-03-09 LAB — BASIC METABOLIC PANEL
Anion gap: 10 (ref 5–15)
BUN: 21 mg/dL (ref 8–23)
CO2: 25 mmol/L (ref 22–32)
Calcium: 8.8 mg/dL — ABNORMAL LOW (ref 8.9–10.3)
Chloride: 104 mmol/L (ref 98–111)
Creatinine, Ser: 0.85 mg/dL (ref 0.61–1.24)
GFR, Estimated: >60 mL/min (ref >60)
Glucose, Bld: 90 mg/dL (ref 70–99)
Potassium: 3.4 mmol/L — ABNORMAL LOW (ref 3.5–5.1)
Sodium: 139 mmol/L (ref 135–145)

## 2023-03-09 LAB — GLUCOSE, CAPILLARY
Glucose-Capillary: 83 mg/dL (ref 70–99)
Glucose-Capillary: 99 mg/dL (ref 70–99)

## 2023-03-09 LAB — HEMOGLOBIN A1C
Hgb A1c MFr Bld: 7.5 % — ABNORMAL HIGH (ref 4.8–5.6)
Mean Plasma Glucose: 168.55 mg/dL

## 2023-03-09 SURGERY — EXCISION, MASS, NECK
Anesthesia: General | Laterality: Left

## 2023-03-09 MED ORDER — 0.9 % SODIUM CHLORIDE (POUR BTL) OPTIME
TOPICAL | Status: DC | PRN
Start: 2023-03-09 — End: 2023-03-09
  Administered 2023-03-09: 1000 mL

## 2023-03-09 MED ORDER — BUPIVACAINE-EPINEPHRINE 0.25% -1:200000 IJ SOLN
INTRAMUSCULAR | Status: AC
  Filled 2023-03-09: qty 1

## 2023-03-09 MED ORDER — ALBUTEROL SULFATE (2.5 MG/3ML) 0.083% IN NEBU
2.5000 mg | INHALATION_SOLUTION | Freq: Once | RESPIRATORY_TRACT | Status: AC
  Administered 2023-03-09: 2.5 mg via RESPIRATORY_TRACT

## 2023-03-09 MED ORDER — CEFAZOLIN SODIUM-DEXTROSE 2-4 GM/100ML-% IV SOLN
2.0000 g | INTRAVENOUS | Status: AC
  Administered 2023-03-09: 2 g via INTRAVENOUS
  Filled 2023-03-09: qty 100

## 2023-03-09 MED ORDER — BUTALBITAL-APAP-CAFFEINE 50-325-40 MG PO TABS
1.0000 | ORAL_TABLET | ORAL | Status: DC | PRN
  Administered 2023-03-09: 1 via ORAL
  Filled 2023-03-09 (×3): qty 1

## 2023-03-09 MED ORDER — CHLORHEXIDINE GLUCONATE CLOTH 2 % EX PADS: 6.0000 | MEDICATED_PAD | Freq: Once | CUTANEOUS | Status: DC

## 2023-03-09 MED ORDER — OXYCODONE HCL 5 MG PO TABS: 5.0000 mg | ORAL_TABLET | Freq: Once | ORAL | Status: DC | PRN

## 2023-03-09 MED ORDER — FENTANYL CITRATE (PF) 100 MCG/2ML IJ SOLN
INTRAMUSCULAR | Status: DC | PRN
  Administered 2023-03-09: 100 ug via INTRAVENOUS

## 2023-03-09 MED ORDER — LACTATED RINGERS IV SOLN: INTRAVENOUS | Status: DC

## 2023-03-09 MED ORDER — ALBUTEROL SULFATE (2.5 MG/3ML) 0.083% IN NEBU
INHALATION_SOLUTION | RESPIRATORY_TRACT | Status: AC
  Filled 2023-03-09: qty 3

## 2023-03-09 MED ORDER — HYDROMORPHONE HCL 1 MG/ML IJ SOLN
INTRAMUSCULAR | Status: AC
  Filled 2023-03-09: qty 1

## 2023-03-09 MED ORDER — MIDAZOLAM HCL 5 MG/5ML IJ SOLN
INTRAMUSCULAR | Status: DC | PRN
  Administered 2023-03-09: 1 mg via INTRAVENOUS

## 2023-03-09 MED ORDER — ONDANSETRON HCL 4 MG/2ML IJ SOLN: 4.0000 mg | Freq: Once | INTRAMUSCULAR | Status: DC | PRN

## 2023-03-09 MED ORDER — ROCURONIUM BROMIDE 10 MG/ML (PF) SYRINGE
PREFILLED_SYRINGE | INTRAVENOUS | Status: AC
  Filled 2023-03-09: qty 10

## 2023-03-09 MED ORDER — MIDAZOLAM HCL 2 MG/2ML IJ SOLN
INTRAMUSCULAR | Status: AC
  Filled 2023-03-09: qty 2

## 2023-03-09 MED ORDER — BUPIVACAINE-EPINEPHRINE 0.25% -1:200000 IJ SOLN
INTRAMUSCULAR | Status: DC | PRN
Start: 2023-03-09 — End: 2023-03-09
  Administered 2023-03-09: 20 mL

## 2023-03-09 MED ORDER — ONDANSETRON HCL 4 MG/2ML IJ SOLN
INTRAMUSCULAR | Status: DC | PRN
  Administered 2023-03-09: 4 mg via INTRAVENOUS

## 2023-03-09 MED ORDER — FENTANYL CITRATE (PF) 100 MCG/2ML IJ SOLN
INTRAMUSCULAR | Status: AC
  Filled 2023-03-09: qty 2

## 2023-03-09 MED ORDER — ORAL CARE MOUTH RINSE: 15.0000 mL | Freq: Once | OROMUCOSAL | Status: AC

## 2023-03-09 MED ORDER — TRAMADOL HCL 50 MG PO TABS: 50.0000 mg | ORAL_TABLET | Freq: Four times a day (QID) | ORAL | 0 refills | Status: DC | PRN

## 2023-03-09 MED ORDER — ROCURONIUM BROMIDE 10 MG/ML (PF) SYRINGE
PREFILLED_SYRINGE | INTRAVENOUS | Status: DC | PRN
  Administered 2023-03-09: 50 mg via INTRAVENOUS

## 2023-03-09 MED ORDER — OXYCODONE HCL 5 MG/5ML PO SOLN: 5.0000 mg | Freq: Once | ORAL | Status: DC | PRN

## 2023-03-09 MED ORDER — ONDANSETRON HCL 4 MG/2ML IJ SOLN
INTRAMUSCULAR | Status: AC
  Filled 2023-03-09: qty 2

## 2023-03-09 MED ORDER — PHENYLEPHRINE 80 MCG/ML (10ML) SYRINGE FOR IV PUSH (FOR BLOOD PRESSURE SUPPORT)
PREFILLED_SYRINGE | INTRAVENOUS | Status: AC
  Filled 2023-03-09: qty 10

## 2023-03-09 MED ORDER — INSULIN ASPART 100 UNIT/ML IJ SOLN: 0.0000 [IU] | INTRAMUSCULAR | Status: DC | PRN

## 2023-03-09 MED ORDER — HYDROMORPHONE HCL 1 MG/ML IJ SOLN
0.2500 mg | INTRAMUSCULAR | Status: DC | PRN
  Administered 2023-03-09: 0.5 mg via INTRAVENOUS

## 2023-03-09 MED ORDER — CHLORHEXIDINE GLUCONATE 0.12 % MT SOLN
15.0000 mL | Freq: Once | OROMUCOSAL | Status: AC
  Administered 2023-03-09: 15 mL via OROMUCOSAL

## 2023-03-09 MED ORDER — LIDOCAINE HCL (PF) 2 % IJ SOLN
INTRAMUSCULAR | Status: AC
  Filled 2023-03-09: qty 5

## 2023-03-09 MED ORDER — PHENYLEPHRINE 80 MCG/ML (10ML) SYRINGE FOR IV PUSH (FOR BLOOD PRESSURE SUPPORT)
PREFILLED_SYRINGE | INTRAVENOUS | Status: DC | PRN
  Administered 2023-03-09: 80 ug via INTRAVENOUS
  Administered 2023-03-09: 120 ug via INTRAVENOUS
  Administered 2023-03-09: 160 ug via INTRAVENOUS

## 2023-03-09 MED ORDER — PROPOFOL 10 MG/ML IV BOLUS
INTRAVENOUS | Status: DC | PRN
  Administered 2023-03-09: 120 mg via INTRAVENOUS

## 2023-03-09 MED ORDER — PROPOFOL 10 MG/ML IV BOLUS
INTRAVENOUS | Status: AC
  Filled 2023-03-09: qty 20

## 2023-03-09 SURGICAL SUPPLY — 32 items
ADH SKN CLS APL DERMABOND .7 (GAUZE/BANDAGES/DRESSINGS) ×1
APL PRP STRL LF DISP 70% ISPRP (MISCELLANEOUS) ×2
BAG COUNTER SPONGE SURGICOUNT (BAG) IMPLANT
BAG SPNG CNTER NS LX DISP (BAG)
BLADE HEX COATED 2.75 (ELECTRODE) ×1 IMPLANT
BLADE SURG 15 STRL LF DISP TIS (BLADE) ×1 IMPLANT
BLADE SURG 15 STRL SS (BLADE) ×1
CHLORAPREP W/TINT 26 (MISCELLANEOUS) ×2 IMPLANT
DERMABOND ADVANCED .7 DNX12 (GAUZE/BANDAGES/DRESSINGS) IMPLANT
DISSECTOR ROUND CHERRY 3/8 STR (MISCELLANEOUS) IMPLANT
DRAPE LAPAROTOMY T 98X78 PEDS (DRAPES) ×1 IMPLANT
ELECT REM PT RETURN 15FT ADLT (MISCELLANEOUS) ×1 IMPLANT
GAUZE 4X4 16PLY ~~LOC~~+RFID DBL (SPONGE) ×1 IMPLANT
GAUZE SPONGE 4X4 12PLY STRL (GAUZE/BANDAGES/DRESSINGS) IMPLANT
GLOVE SURG ORTHO 8.0 STRL STRW (GLOVE) ×1 IMPLANT
GLOVE SURG SYN 7.5 E (GLOVE) ×1
GLOVE SURG SYN 7.5 PF PI (GLOVE) ×1 IMPLANT
GOWN STRL REUS W/ TWL XL LVL3 (GOWN DISPOSABLE) ×2 IMPLANT
GOWN STRL REUS W/TWL XL LVL3 (GOWN DISPOSABLE) ×2
HEMOSTAT SURGICEL 2X4 FIBR (HEMOSTASIS) ×1 IMPLANT
KIT BASIN OR (CUSTOM PROCEDURE TRAY) ×1 IMPLANT
KIT TURNOVER KIT A (KITS) IMPLANT
NS IRRIG 1000ML POUR BTL (IV SOLUTION) ×1 IMPLANT
PACK BASIC VI WITH GOWN DISP (CUSTOM PROCEDURE TRAY) ×1 IMPLANT
PENCIL SMOKE EVACUATOR (MISCELLANEOUS) IMPLANT
STAPLER VISISTAT 35W (STAPLE) ×1 IMPLANT
STRIP CLOSURE SKIN 1/2X4 (GAUZE/BANDAGES/DRESSINGS) ×1 IMPLANT
SUT MNCRL AB 4-0 PS2 18 (SUTURE) IMPLANT
SUT VIC AB 3-0 SH 18 (SUTURE) IMPLANT
SYR BULB IRRIG 60ML STRL (SYRINGE) ×1 IMPLANT
TOWEL OR 17X26 10 PK STRL BLUE (TOWEL DISPOSABLE) ×1 IMPLANT
TOWEL OR NON WOVEN STRL DISP B (DISPOSABLE) ×1 IMPLANT

## 2023-03-09 NOTE — Anesthesia Preprocedure Evaluation (Addendum)
Anesthesia Evaluation  Patient identified by MRN, date of birth, ID band Patient awake    Reviewed: Allergy & Precautions, NPO status , Patient's Chart, lab work & pertinent test results  History of Anesthesia Complications Negative for: history of anesthetic complications  Airway Mallampati: II  TM Distance: >3 FB Neck ROM: Full    Dental  (+) Edentulous Lower, Edentulous Upper   Pulmonary COPD (O2 PRN at home),  COPD inhaler and oxygen dependent, Current Smoker and Patient abstained from smoking.   Pulmonary exam normal breath sounds clear to auscultation       Cardiovascular hypertension, Pt. on medications + CAD, + Past MI and + DOE  Normal cardiovascular exam Rhythm:Regular Rate:Normal  Cerebral aneurysm, nonruptured  Prior Inferior MI   Neuro/Psych  Headaches, Seizures -, Well Controlled,   negative psych ROS   GI/Hepatic Neg liver ROS,GERD  Medicated,,  Endo/Other  diabetes, Poorly Controlled, Type 2, Oral Hypoglycemic Agents, Insulin Dependent  GLP-1 RA therapy  Renal/GU negative Renal ROS  negative genitourinary   Musculoskeletal negative musculoskeletal ROS (+)  Soft tissue mass left posterior neck   Abdominal   Peds  Hematology negative hematology ROS (+)   Anesthesia Other Findings Day of surgery medications reviewed with patient.  Reproductive/Obstetrics negative OB ROS                              Anesthesia Physical Anesthesia Plan  ASA: 3  Anesthesia Plan: General   Post-op Pain Management: Regional block* and Tylenol PO (pre-op)*   Induction: Intravenous  PONV Risk Score and Plan: 1 and Ondansetron and Treatment may vary due to age or medical condition  Airway Management Planned: Oral ETT  Additional Equipment: None  Intra-op Plan:   Post-operative Plan: Extubation in OR  Informed Consent: I have reviewed the patients History and Physical, chart, labs and  discussed the procedure including the risks, benefits and alternatives for the proposed anesthesia with the patient or authorized representative who has indicated his/her understanding and acceptance.     Dental advisory given and Interpreter used for interveiw  Plan Discussed with: CRNA and Anesthesiologist  Anesthesia Plan Comments:         Anesthesia Quick Evaluation

## 2023-03-09 NOTE — Anesthesia Procedure Notes (Signed)
Procedure Name: Intubation Date/Time: 03/09/2023 10:42 AM  Performed by: Briant Sites, CRNAPre-anesthesia Checklist: Patient identified, Emergency Drugs available, Suction available and Patient being monitored Patient Re-evaluated:Patient Re-evaluated prior to induction Oxygen Delivery Method: Circle system utilized Preoxygenation: Pre-oxygenation with 100% oxygen Induction Type: IV induction Ventilation: Mask ventilation without difficulty Laryngoscope Size: Mac and 4 Grade View: Grade I Tube type: Oral Number of attempts: 1 Airway Equipment and Method: Stylet Placement Confirmation: ETT inserted through vocal cords under direct vision, positive ETCO2 and breath sounds checked- equal and bilateral Secured at: 21 cm Tube secured with: Tape Dental Injury: Teeth and Oropharynx as per pre-operative assessment

## 2023-03-09 NOTE — Op Note (Signed)
Operative Note  Pre-operative Diagnosis:  soft tissue mass left posterior neck  Post-operative Diagnosis:  same  Surgeon:  Darnell Level, MD  Assistant:  none   Procedure:  Excision soft tissue mass left posterior neck, 5 x 4.5 x 2.5 cm, subcutaneous  Anesthesia:  general  Estimated Blood Loss:  minimal  Drains: none         Specimen: to pathology  Indications:  Patient is referred furred by his orthopedic surgeon, Dr. Dorene Grebe, for surgical evaluation and management of an enlarging soft tissue mass on the left posterior neck. Patient has undergone a left total shoulder replacement. Patient has a soft tissue mass in the left posterior neck at the edge of the trapezius muscle which has been present for approximately 1 year. It is gradually increasing in size. It is tender to palpation. Patient notes pain in the left neck radiating into the posterior head. Patient has had no specific imaging studies performed. He has had no prior surgery on this lesion. He has had no other such lesions removed in the past. He presents today accompanied by his family to discuss surgical excision.   Procedure:  The patient was seen in the pre-op holding area. The risks, benefits, complications, treatment options, and expected outcomes were previously discussed with the patient. The patient agreed with the proposed plan and has signed the informed consent form.  The patient was brought to the operating room by the surgical team, identified as [name] and the procedure verified. A "time out" was completed and the above information confirmed.  Following induction of general anesthesia, the patient was turned to a right lateral decubitus position on the beanbag and properly positioned.  Patient was then prepped and draped in the usual aseptic fashion.  After ascertaining that an adequate level of anesthesia been achieved, an incision is made over the mass in the posterior lateral left neck with a #15 blade.   Dissection is carried through skin and subcutaneous tissue.  Hemostasis is achieved with the electrocautery.  Using the electrocautery for hemostasis, an irregularly shaped soft tissue mass is excised.  It is in the subcutaneous tissues but extends down to the underlying musculature.  It is completely excised.  It measures 5.0 x 4.5 x 2.5 cm in dimension.  It is submitted in its entirety to pathology for review.  Good hemostasis is achieved throughout the wound with the electrocautery.  Local anesthesia is infiltrated in the skin and around the base of the wound and into the subcutaneous tissues.  Subcutaneous tissues were then closed with interrupted 3-0 Vicryl sutures.  Skin is closed with a running 4-0 Monocryl subcuticular suture.  Wound was washed and dried and Dermabond is applied as dressing.  Patient is awakened from anesthesia and placed in a supine position on a stretcher.  The patient is transported to the recovery room in stable condition.  The patient tolerated the procedure well.   Darnell Level, MD [facility] Surgery Office: [phone]

## 2023-03-09 NOTE — Discharge Instructions (Addendum)
  [facility] -- DISCHARGE INSTRUCTIONS  REMINDER:   Carry a list of your medications and allergies with you at all times  Call your pharmacy at least 1 week in advance to refill prescriptions  Do not mix any prescribed pain medicine with alcohol  Do not drive any motor vehicles while taking pain medication  Take medications with food unless otherwise directed  Follow-up appointments (date to return to physician): Please call [phone] to confirm your follow up appointment with your surgeon.  Call your Surgeon if you have:  Temperature greater than 101.0  Persistent nausea and vomiting  Severe uncontrolled pain  Redness, tenderness, or signs of infection (pain, swelling, redness, odor or green/yellow discharge around the site)  Difficulty breathing, headache or visual disturbances  Hives  Persistent dizziness or light-headedness  Any other questions or concerns you may have after discharge  In an emergency, call 911 or go to an Emergency Department at a nearby hospital.  Diet: Begin with liquids, and if they are tolerated, resume your usual diet.  Avoid spicy, greasy or heavy foods.  If you have nausea or vomiting, go back to liquids.  If you cannot keep liquids down, call your doctor.  Avoid alcohol consumption while on prescription pain medications. Good nutrition promotes healing. Increase fiber and fluids.   ADDITIONAL INSTRUCTIONS: Apply ice packs to wound for first 48 hours and then as needed.  Leave Dermabond in place 7-10 days.  May shower.  [facility] Office: [phone]

## 2023-03-09 NOTE — Transfer of Care (Signed)
Immediate Anesthesia Transfer of Care Note  Patient: Duane Cuevas  Procedure(s) Performed: EXCISION SOFT TISSUE MASS LEFT POSTERIOR NECK (Left)  Patient Location: PACU  Anesthesia Type:General  Level of Consciousness: sedated  Airway & Oxygen Therapy: Patient Spontanous Breathing and Patient connected to nasal cannula oxygen  Post-op Assessment: Report given to RN  Post vital signs: Reviewed and stable  Last Vitals:  Vitals Value Taken Time  BP 129/80 03/09/23 1124  Temp    Pulse 95 03/09/23 1126  Resp 19 03/09/23 1126  SpO2 100 % 03/09/23 1126  Vitals shown include unfiled device data.  Last Pain:  Vitals:   03/09/23 0938  TempSrc: Oral  PainSc: 0-No pain         Complications: No notable events documented.

## 2023-03-09 NOTE — Anesthesia Postprocedure Evaluation (Signed)
Anesthesia Post Note  Patient: Duane Cuevas  Procedure(s) Performed: EXCISION SOFT TISSUE MASS LEFT POSTERIOR NECK (Left)     Patient location during evaluation: PACU Anesthesia Type: General Level of consciousness: awake and alert and oriented Pain management: pain level controlled Vital Signs Assessment: post-procedure vital signs reviewed and stable Respiratory status: spontaneous breathing, nonlabored ventilation and respiratory function stable Cardiovascular status: blood pressure returned to baseline and stable Postop Assessment: no apparent nausea or vomiting Anesthetic complications: no   No notable events documented.  Last Vitals:  Vitals:   03/09/23 1145 03/09/23 1200  BP: 119/88 113/77  Pulse: 86 89  Resp: (!) 23 20  Temp:    SpO2: 100% 99%    Last Pain:  Vitals:   03/09/23 1145  TempSrc:   PainSc: 10-Worst pain ever                 Stormee Duda A.

## 2023-03-09 NOTE — Interval H&P Note (Signed)
History and Physical Interval Note:  03/09/2023 10:00 AM  Duane Cuevas  has presented today for surgery, with the diagnosis of SOFT TISSUE MASS LEFT NECK.  The various methods of treatment have been discussed with the patient and family. After consideration of risks, benefits and other options for treatment, the patient has consented to    Procedure(s): EXCISION SOFT TISSUE MASS LEFT POSTERIOR NECK (Left) as a surgical intervention.    The patient's history has been reviewed, patient examined, no change in status, stable for surgery.  I have reviewed the patient's chart and labs.  Questions were answered to the patient's satisfaction.    Darnell Level, MD [facility] practice Office: [phone]   Darnell Level

## 2023-03-10 ENCOUNTER — Encounter (HOSPITAL_COMMUNITY): Payer: Self-pay | Admitting: Surgery

## 2023-03-10 LAB — SURGICAL PATHOLOGY

## 2023-03-10 NOTE — Progress Notes (Signed)
Great thx

## 2023-03-10 NOTE — Progress Notes (Signed)
Benign lipoma, as expected.  Darnell Level, MD [facility] practice Office: [phone]

## 2023-04-10 ENCOUNTER — Other Ambulatory Visit: Payer: Self-pay | Admitting: Emergency Medicine

## 2023-04-10 DIAGNOSIS — I152 Hypertension secondary to endocrine disorders: Secondary | ICD-10-CM

## 2023-04-14 ENCOUNTER — Emergency Department (HOSPITAL_COMMUNITY)
Admission: EM | Admit: 2023-04-14 | Discharge: 2023-04-14 | Disposition: A | Discharge status: Home / Self Care | Payer: Medicare HMO | Attending: Emergency Medicine | Admitting: Emergency Medicine

## 2023-04-14 ENCOUNTER — Other Ambulatory Visit: Payer: Self-pay

## 2023-04-14 ENCOUNTER — Emergency Department (HOSPITAL_COMMUNITY): Payer: Medicare HMO

## 2023-04-14 ENCOUNTER — Encounter (HOSPITAL_COMMUNITY): Payer: Self-pay

## 2023-04-14 DIAGNOSIS — M25511 Pain in right shoulder: Secondary | ICD-10-CM | POA: Diagnosis present

## 2023-04-14 DIAGNOSIS — Z794 Long term (current) use of insulin: Secondary | ICD-10-CM | POA: Diagnosis not present

## 2023-04-14 DIAGNOSIS — Z7982 Long term (current) use of aspirin: Secondary | ICD-10-CM | POA: Insufficient documentation

## 2023-04-14 DIAGNOSIS — M19011 Primary osteoarthritis, right shoulder: Secondary | ICD-10-CM | POA: Diagnosis not present

## 2023-04-14 DIAGNOSIS — M13811 Other specified arthritis, right shoulder: Secondary | ICD-10-CM | POA: Diagnosis not present

## 2023-04-14 DIAGNOSIS — Z043 Encounter for examination and observation following other accident: Secondary | ICD-10-CM | POA: Diagnosis not present

## 2023-04-14 MED ORDER — OXYCODONE-ACETAMINOPHEN 5-325 MG PO TABS
2.0000 | ORAL_TABLET | Freq: Once | ORAL | Status: AC
  Administered 2023-04-14: 2 via ORAL
  Filled 2023-04-14: qty 2

## 2023-04-14 MED ORDER — OXYCODONE-ACETAMINOPHEN 5-325 MG PO TABS
1.0000 | ORAL_TABLET | Freq: Four times a day (QID) | ORAL | 0 refills | Status: DC | PRN
Start: 2023-04-14 — End: 2023-05-01

## 2023-04-14 MED ORDER — METHOCARBAMOL 500 MG PO TABS: 500.0000 mg | ORAL_TABLET | Freq: Two times a day (BID) | ORAL | 0 refills | Status: DC

## 2023-04-14 MED ORDER — KETOROLAC TROMETHAMINE 60 MG/2ML IM SOLN
30.0000 mg | Freq: Once | INTRAMUSCULAR | Status: AC
  Administered 2023-04-14: 30 mg via INTRAMUSCULAR
  Filled 2023-04-14: qty 2

## 2023-04-14 MED ORDER — MORPHINE SULFATE (PF) 4 MG/ML IV SOLN
6.0000 mg | Freq: Once | INTRAVENOUS | Status: AC
  Administered 2023-04-14: 6 mg via INTRAMUSCULAR
  Filled 2023-04-14: qty 2

## 2023-04-14 NOTE — ED Provider Notes (Signed)
[place] EMERGENCY DEPARTMENT AT [facility] Provider Note   CSN: [id number] Arrival date & time: 04/14/23  1457     History  Chief Complaint  Patient presents with   Shoulder Pain   Hand Pain    Duane Cuevas is a 67 y.o. male.  Six 8-year-old male presents had mechanical fall 2 days ago.  Patient fell onto his right shoulder.  Denies striking his head.  No neck pain.  No rib or back or lower extremity discomfort.  Pain is sharp and worse with movement in his left shoulder.  Does radiate down to his right hand.  Denies any weakness numbness or tingling to his right hand.  Pain better with remaining still       Home Medications Prior to Admission medications   Medication Sig Start Date End Date Taking? Authorizing Provider  albuterol (PROVENTIL) (2.5 MG/3ML) 0.083% nebulizer solution INHALAR 1 FRASCO VIA NEBULIZADOR CADA 6 HORAS SEGUN SEA NECESARIO PARA SIBILANCIAS O FALTA DE AIRE Patient taking differently: Take 2.5 mg by nebulization 3 (three) times a week. INHALAR 1 FRASCO VIA NEBULIZADOR CADA 6 HORAS SEGUN SEA NECESARIO PARA SIBILANCIAS O FALTA DE AIRE 12/07/22   Sagardia, Eilleen Kempf, MD  albuterol (VENTOLIN HFA) 108 (90 Base) MCG/ACT inhaler INHALAR 2 BOCANADAS POR VIA ORAL CADA 6 HORAS SEGUN SEA NECESARIO PARA SIBILANCIAS O FALTA DE AIRE Patient taking differently: Inhale 1-2 puffs into the lungs every 6 (six) hours as needed for wheezing or shortness of breath. 10/07/22   Georgina Quint, MD  amLODipine (NORVASC) 5 MG tablet TOMAR 1 TABLETA POR VAI ORAL UNA VEZ AL DIA Patient taking differently: Take 5 mg by mouth daily. 12/07/22   Georgina Quint, MD  aspirin EC 81 MG tablet Take 1 tablet (81 mg total) by mouth daily. Swallow whole. Patient not taking: Reported on 03/03/2023 09/01/22   Magnant, Joycie Peek, PA-C  atorvastatin (LIPITOR) 20 MG tablet TOMAR 1 TABLETA POR VIA ORAL UNA VEZ AL DIA Patient taking differently: Take 20 mg by mouth daily. 10/07/22    Georgina Quint, MD  blood glucose meter kit and supplies KIT Dispense based on patient and insurance preference. Use up to four times daily as directed. 10/22/20   Georgina Quint, MD  blood glucose meter kit and supplies Dispense based on patient and insurance preference. Use up to four times daily as directed. (FOR ICD-10 E10.9, E11.9). 10/24/20   Georgina Quint, MD  Blood Glucose Monitoring Suppl (ONE TOUCH ULTRA 2) w/Device KIT Use as directed to check blood glucose up to 2 times a day 12/29/22   Georgina Quint, MD  Budeson-Glycopyrrol-Formoterol (BREZTRI AEROSPHERE) 160-9-4.8 MCG/ACT AERO INHALAR 2 BOCANADAS POR VIA ORAL DOS VECES AL DIA Patient taking differently: Inhale 2 puffs into the lungs 2 (two) times daily. 06/10/22   Georgina Quint, MD  Dulaglutide (TRULICITY) 1.5 MG/0.5ML SOPN INYECTAR CONTENIDOS DE UN LAPIZ POR VIA SUBCUTANEA CADA SEMANA, EL MISMO DIA CADA [facility] Patient taking differently: Inject 1.5 mg into the skin once a week. INYECTAR CONTENIDOS DE Benancio Deeds POR VIA Raynelle Jan [place], EL MISMO DIA CADA [facility] Thursday's 09/29/22   Sagardia, Eilleen Kempf, MD  FARXIGA 10 MG TABS tablet TOMAR 1 TABLETA POR VIA ORAL UNA VEZ AL DIA Patient taking differently: Take 10 mg by mouth daily. 02/06/23   Georgina Quint, MD  gabapentin (NEURONTIN) 300 MG capsule Take 1 capsule (300 mg total) by mouth 2 (two) times daily. Patient not taking: Reported  on 03/03/2023 09/14/22   Cammy Copa, MD  glipiZIDE (GLUCOTROL) 10 MG tablet TOMAR 1 TABLETA POR VIA ORAL UNA VEZ AL DIA ANTES DEL DESAYUNO *TOMAR 1 TABLETA ADICIONAL SI AZUCAR EN LA SANGRE ESTA ALTA* Patient taking differently: Take 10 mg by mouth daily before breakfast. TOMAR 1 TABLETA POR VIA ORAL UNA VEZ AL DIA ANTES DEL DESAYUNO *TOMAR 1 TABLETA ADICIONAL SI AZUCAR EN LA SANGRE ESTA ALTA* 09/14/22   Sagardia, Eilleen Kempf, MD  insulin aspart (NOVOLOG FLEXPEN) 100 UNIT/ML FlexPen INYECTAR 3 UNIDADES POR  VIA SUBCUTANEA TRES VECES AL DIA COMO INDICADO. AJUSTAR CANTIDAD DE INSULINA POR ESCALA MOVIL. MAS DOSIS 30 UNIDADES Patient taking differently: Inject 3 Units into the skin 3 (three) times daily with meals. As directed uses SS 07/12/22   Sagardia, Eilleen Kempf, MD  Insulin Pen Needle (B-D ULTRAFINE III SHORT PEN) 31G X 8 MM MISC USAR PARA INYECTAR INSULINA 10/13/21   Georgina Quint, MD  Insulin Pen Needle (B-D ULTRAFINE III SHORT PEN) 31G X 8 MM MISC USAR PARA INYECTAR INSULINA 10/13/21   Georgina Quint, MD  [facility] ULTRA test strip USE TO CHECK BLOOD SUGAR AS INSTRUCTED 01/02/23   Georgina Quint, MD  pantoprazole (PROTONIX) 40 MG tablet TOMAR 1 TABLETA POR VIA ORAL UNA VEZ AL DIA Patient taking differently: Take 40 mg by mouth daily. 01/09/23   Georgina Quint, MD  sildenafil (VIAGRA) 100 MG tablet Take 0.5-1 tablets (50-100 mg total) by mouth daily as needed for erectile dysfunction. Patient taking differently: Take 50-100 mg by mouth daily as needed for erectile dysfunction. 12/29/22   Georgina Quint, MD  traMADol (ULTRAM) 50 MG tablet Take 1 tablet (50 mg total) by mouth every 6 (six) hours as needed for moderate pain. 03/09/23   Darnell Level, MD  TRESIBA FLEXTOUCH 100 UNIT/ML FlexTouch Pen INYECTAR 25 [place] POR VIA SUBCUTANEA DIARIAMENTE 04/10/23   Georgina Quint, MD  Ubrogepant (UBRELVY) 100 MG TABS TOMAR 1 TALBETA POR VIA ORAL UNA VEZ AL DIA 07/11/22   Georgina Quint, MD      Allergies    Patient has no known allergies.    Review of Systems   Review of Systems  All other systems reviewed and are negative.   Physical Exam Updated Vital Signs BP 130/80 (BP Location: Left Arm)   Pulse (!) 119   Temp 98 F (36.7 C) (Oral)   Resp 18   Ht 1.727 m (5\' 8" )   Wt 72.6 kg   SpO2 98%   BMI 24.33 kg/m  Physical Exam Vitals and nursing note reviewed.  Constitutional:      General: He is not in acute distress.    Appearance: Normal appearance. He is  well-developed. He is not toxic-appearing.  HENT:     Head: Normocephalic and atraumatic.  Eyes:     General: Lids are normal.     Conjunctiva/sclera: Conjunctivae normal.     Pupils: Pupils are equal, round, and reactive to light.  Neck:     Thyroid: No thyroid mass.     Trachea: No tracheal deviation.  Cardiovascular:     Rate and Rhythm: Normal rate and regular rhythm.     Heart sounds: Normal heart sounds. No murmur heard.    No gallop.  Pulmonary:     Effort: Pulmonary effort is normal. No respiratory distress.     Breath sounds: Normal breath sounds. No stridor. No decreased breath sounds, wheezing, rhonchi or rales.  Abdominal:  General: There is no distension.     Palpations: Abdomen is soft.     Tenderness: There is no abdominal tenderness. There is no rebound.  Musculoskeletal:     Right shoulder: Tenderness and bony tenderness present. Decreased range of motion.       Arms:     Cervical back: Normal range of motion and neck supple.     Comments: Neurovasc intact at right hand  Skin:    General: Skin is warm and dry.     Findings: No abrasion or rash.  Neurological:     Mental Status: He is alert and oriented to person, place, and time. Mental status is at baseline.     GCS: GCS eye subscore is 4. GCS verbal subscore is 5. GCS motor subscore is 6.     Cranial Nerves: Cranial nerves are intact. No cranial nerve deficit.     Sensory: No sensory deficit.     Motor: Motor function is intact.  Psychiatric:        Attention and Perception: Attention normal.        Speech: Speech normal.        Behavior: Behavior normal.     ED Results / Procedures / Treatments   Labs (all labs ordered are listed, but only abnormal results are displayed) Labs Reviewed - No data to display  EKG None  Radiology No results found.  Procedures Procedures    Medications Ordered in ED Medications  oxyCODONE-acetaminophen (PERCOCET/ROXICET) 5-325 MG per tablet 2 tablet (has  no administration in time range)    ED Course/ Medical Decision Making/ A&P                                 Medical Decision Making Amount and/or Complexity of Data Reviewed Radiology: ordered.  Risk Prescription drug management.   Patient medicated for pain here and feels better.  X-ray of right shoulder negative for fracture.  Does show arthritis.  Will place patient on pain medication here.  Will follow-up with orthopedist.        Final Clinical Impression(s) / ED Diagnoses Final diagnoses:  None    Rx / DC Orders ED Discharge Orders     None         Lorre Nick, MD 04/14/23 2114

## 2023-04-14 NOTE — ED Triage Notes (Signed)
Pt arrived reporting fall 2 days ago, no blood thinners. Denies head injury or Loc. Reports pain to left shoulder and hand pain. States he tripped over something.Denies dizziness. States has been taking Motrin, not helping

## 2023-04-17 ENCOUNTER — Encounter: Payer: Self-pay | Admitting: Emergency Medicine

## 2023-04-17 ENCOUNTER — Ambulatory Visit (INDEPENDENT_AMBULATORY_CARE_PROVIDER_SITE_OTHER): Payer: Medicare HMO | Admitting: Emergency Medicine

## 2023-04-17 VITALS — BP 118/76 | HR 90 | Temp 98.4°F | Wt 166.5 lb

## 2023-04-17 DIAGNOSIS — Z7985 Long-term (current) use of injectable non-insulin antidiabetic drugs: Secondary | ICD-10-CM | POA: Diagnosis not present

## 2023-04-17 DIAGNOSIS — Z7984 Long term (current) use of oral hypoglycemic drugs: Secondary | ICD-10-CM

## 2023-04-17 DIAGNOSIS — S40011A Contusion of right shoulder, initial encounter: Secondary | ICD-10-CM | POA: Diagnosis not present

## 2023-04-17 DIAGNOSIS — E785 Hyperlipidemia, unspecified: Secondary | ICD-10-CM | POA: Diagnosis not present

## 2023-04-17 DIAGNOSIS — I2510 Atherosclerotic heart disease of native coronary artery without angina pectoris: Secondary | ICD-10-CM

## 2023-04-17 DIAGNOSIS — E1159 Type 2 diabetes mellitus with other circulatory complications: Secondary | ICD-10-CM

## 2023-04-17 DIAGNOSIS — J439 Emphysema, unspecified: Secondary | ICD-10-CM

## 2023-04-17 DIAGNOSIS — Z23 Encounter for immunization: Secondary | ICD-10-CM | POA: Diagnosis not present

## 2023-04-17 DIAGNOSIS — E1169 Type 2 diabetes mellitus with other specified complication: Secondary | ICD-10-CM | POA: Diagnosis not present

## 2023-04-17 DIAGNOSIS — I152 Hypertension secondary to endocrine disorders: Secondary | ICD-10-CM

## 2023-04-17 MED ORDER — TRAMADOL HCL 50 MG PO TABS
50.0000 mg | ORAL_TABLET | Freq: Three times a day (TID) | ORAL | 1 refills | Status: AC | PRN
Start: 2023-04-17 — End: 2023-04-22

## 2023-04-17 NOTE — Assessment & Plan Note (Addendum)
Stable.  Continues to smoke.  Cardiovascular/cancer risks associated with smoking discussed.  Smoking cessation advice given Continues Breztri 2 inhalations twice a day and albuterol inhaler as needed

## 2023-04-17 NOTE — Assessment & Plan Note (Signed)
Stable without recent anginal episodes Continue daily baby aspirin and atorvastatin 20 mg daily

## 2023-04-17 NOTE — Progress Notes (Signed)
Duane Cuevas 67 y.o.   Chief Complaint  Patient presents with   Follow-up    Patient was seen in the D for poss arthritis, patient states his right shoulder is swollen, painful . X rays was done in the ED.       HISTORY OF PRESENT ILLNESS: This is a 67 y.o. male here for follow-up of emergency department visit on 04/14/2023 when he presented after accidental fall injuring right shoulder.  Still complaining of pain and swelling to right shoulder area. X-ray showed moderate degenerative changes but no fracture or dislocation Blood work done and reviewed with patient Patient also has history of diabetes and COPD  HPI   Prior to Admission medications   Medication Sig Start Date End Date Taking? Authorizing Provider  albuterol (PROVENTIL) (2.5 MG/3ML) 0.083% nebulizer solution INHALAR 1 FRASCO VIA NEBULIZADOR CADA 6 HORAS SEGUN SEA NECESARIO PARA SIBILANCIAS O FALTA DE AIRE Patient taking differently: Take 2.5 mg by nebulization 3 (three) times a week. INHALAR 1 FRASCO VIA NEBULIZADOR CADA 6 HORAS SEGUN SEA NECESARIO PARA SIBILANCIAS O FALTA DE AIRE 12/07/22  Yes Jannatul Wojdyla, Eilleen Kempf, MD  albuterol (VENTOLIN HFA) 108 (90 Base) MCG/ACT inhaler INHALAR 2 BOCANADAS POR VIA ORAL CADA 6 HORAS SEGUN SEA NECESARIO PARA SIBILANCIAS O FALTA DE AIRE Patient taking differently: Inhale 1-2 puffs into the lungs every 6 (six) hours as needed for wheezing or shortness of breath. 10/07/22  Yes Lydiann Bonifas, Eilleen Kempf, MD  amLODipine (NORVASC) 5 MG tablet TOMAR 1 TABLETA POR VAI ORAL UNA VEZ AL DIA Patient taking differently: Take 5 mg by mouth daily. 12/07/22  Yes Zekiel Torian, Eilleen Kempf, MD  atorvastatin (LIPITOR) 20 MG tablet TOMAR 1 TABLETA POR VIA ORAL UNA VEZ AL DIA Patient taking differently: Take 20 mg by mouth daily. 10/07/22  Yes Latavius Capizzi, Eilleen Kempf, MD  blood glucose meter kit and supplies KIT Dispense based on patient and insurance preference. Use up to four times daily as directed. 10/22/20  Yes  Heer Justiss, Eilleen Kempf, MD  blood glucose meter kit and supplies Dispense based on patient and insurance preference. Use up to four times daily as directed. (FOR ICD-10 E10.9, E11.9). 10/24/20  Yes Shonda Mandarino, Eilleen Kempf, MD  Blood Glucose Monitoring Suppl (ONE TOUCH ULTRA 2) w/Device KIT Use as directed to check blood glucose up to 2 times a day 12/29/22  Yes Octaviano Mukai, Eilleen Kempf, MD  Budeson-Glycopyrrol-Formoterol (BREZTRI AEROSPHERE) 160-9-4.8 MCG/ACT AERO INHALAR 2 BOCANADAS POR VIA ORAL DOS VECES AL DIA Patient taking differently: Inhale 2 puffs into the lungs 2 (two) times daily. 06/10/22  Yes Quadry Kampa, Eilleen Kempf, MD  Dulaglutide (TRULICITY) 1.5 MG/0.5ML SOPN INYECTAR CONTENIDOS DE UN LAPIZ POR VIA SUBCUTANEA CADA SEMANA, EL MISMO DIA CADA SEMANA Patient taking differently: Inject 1.5 mg into the skin once a week. INYECTAR CONTENIDOS DE Benancio Deeds POR VIA Driscilla Grammes, EL MISMO DIA CADA [facility] Thursday's 09/29/22  Yes Vershawn Westrup, [place], MD  FARXIGA 10 MG TABS tablet TOMAR 1 TABLETA POR VIA ORAL UNA VEZ AL DIA Patient taking differently: Take 10 mg by mouth daily. 02/06/23  Yes Dannielle Baskins, Eilleen Kempf, MD  glipiZIDE (GLUCOTROL) 10 MG tablet TOMAR 1 TABLETA POR VIA ORAL UNA VEZ AL DIA ANTES DEL DESAYUNO *TOMAR 1 TABLETA ADICIONAL SI AZUCAR EN LA SANGRE ESTA ALTA* Patient taking differently: Take 10 mg by mouth daily before breakfast. TOMAR 1 TABLETA POR VIA ORAL UNA VEZ AL DIA ANTES DEL DESAYUNO *TOMAR 1 TABLETA ADICIONAL SI AZUCAR EN LA SANGRE ESTA ALTA* 09/14/22  Yes Kiaan Overholser, Eilleen Kempf, MD  insulin aspart (NOVOLOG FLEXPEN) 100 UNIT/ML FlexPen INYECTAR 3 UNIDADES POR VIA SUBCUTANEA TRES VECES AL DIA COMO INDICADO. AJUSTAR CANTIDAD DE INSULINA POR ESCALA MOVIL. MAS DOSIS 30 UNIDADES Patient taking differently: Inject 3 Units into the skin 3 (three) times daily with meals. As directed uses SS 07/12/22  Yes Jadda Hunsucker, Eilleen Kempf, MD  Insulin Pen Needle (B-D ULTRAFINE III SHORT PEN) 31G X 8 MM  MISC USAR PARA INYECTAR INSULINA 10/13/21  Yes Asuncion Tapscott, Eilleen Kempf, MD  Insulin Pen Needle (B-D ULTRAFINE III SHORT PEN) 31G X 8 MM MISC USAR PARA INYECTAR INSULINA 10/13/21  Yes Kelsie Kramp, Eilleen Kempf, MD  methocarbamol (ROBAXIN) 500 MG tablet Take 1 tablet (500 mg total) by mouth 2 (two) times daily. 04/14/23  Yes Lorre Nick, MD  [facility] ULTRA test strip USE TO CHECK BLOOD SUGAR AS INSTRUCTED 01/02/23  Yes Mckenzee Beem, Eilleen Kempf, MD  oxyCODONE-acetaminophen (PERCOCET/ROXICET) 5-325 MG tablet Take 1 tablet by mouth every 6 (six) hours as needed for severe pain. 04/14/23  Yes Lorre Nick, MD  pantoprazole (PROTONIX) 40 MG tablet TOMAR 1 TABLETA POR VIA ORAL UNA VEZ AL DIA Patient taking differently: Take 40 mg by mouth daily. 01/09/23  Yes Smrithi Pigford, Eilleen Kempf, MD  sildenafil (VIAGRA) 100 MG tablet Take 0.5-1 tablets (50-100 mg total) by mouth daily as needed for erectile dysfunction. Patient taking differently: Take 50-100 mg by mouth daily as needed for erectile dysfunction. 12/29/22  Yes Alka Falwell, Eilleen Kempf, MD  traMADol (ULTRAM) 50 MG tablet Take 1 tablet (50 mg total) by mouth every 8 (eight) hours as needed for up to 5 days. 04/17/23 04/22/23 Yes Georgina Quint, MD  TRESIBA FLEXTOUCH 100 UNIT/ML FlexTouch Pen INYECTAR 25 UNIDADES POR VIA SUBCUTANEA DIARIAMENTE 04/10/23  Yes Sicilia Killough, Eilleen Kempf, MD  Ubrogepant (UBRELVY) 100 MG TABS TOMAR 1 TALBETA POR VIA ORAL UNA VEZ AL DIA 07/11/22  Yes Georgina Quint, MD  aspirin EC 81 MG tablet Take 1 tablet (81 mg total) by mouth daily. Swallow whole. Patient not taking: Reported on 03/03/2023 09/01/22   Magnant, Joycie Peek, PA-C  gabapentin (NEURONTIN) 300 MG capsule Take 1 capsule (300 mg total) by mouth 2 (two) times daily. Patient not taking: Reported on 03/03/2023 09/14/22   Cammy Copa, MD    No Known Allergies  Patient Active Problem List   Diagnosis Date Noted   Hematoma of right shoulder 04/17/2023   Contusion of right  shoulder 04/17/2023   Mass of soft tissue of neck 03/05/2023   Rotator cuff arthropathy, left 08/31/2022   Biceps tendonitis on left 08/31/2022   Retained orthopedic hardware 08/31/2022   S/P reverse total shoulder arthroplasty, left 08/30/2022   Seizure (HCC) 12/09/2021   Intractable persistent migraine aura without cerebral infarction and with status migrainosus 05/26/2021   Hypertension associated with diabetes (HCC) 09/21/2020   Uncontrolled type 2 diabetes mellitus with hyperglycemia (HCC) 09/13/2020   CAD (coronary artery disease) 01/23/2020   HLD (hyperlipidemia) 11/02/2019   History of MI (myocardial infarction) 11/02/2019   COPD ? GOLD III/ active smoker 08/14/2018   History of diet-controlled diabetes 06/30/2018   COPD (chronic obstructive pulmonary disease) (HCC) 10/14/2014   Dyslipidemia associated with type 2 diabetes mellitus (HCC) 10/14/2014    Past Medical History:  Diagnosis Date   Cerebral aneurysm, nonruptured    followed by dr n. 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   Coronary artery disease    (  pt admitted for chest pain ) nuclear stress test 11-02-2019  low risk, nuclear ef 58%,  prior inferior defect of severe severity indicative of previous MI;;   echo 04-28-2020 ef 60-65%, mild concentric LVH, RSVP 54.18mmHg;;   cardiac CTA 09-15-2020  unremarkable,  calcium score=zero   Dependence on nocturnal oxygen therapy    02-27-2023  per pt daughter,  pt only uses at night w/ O2 sat ,2-3L via [place],  only uses portable if going outside when every hot which is not much   Diverticulosis of colon    DOE (dyspnea on exertion)    02-27-2023  per pt daugter sob w/ stairs, incline's,  ok w/ some yard work if not warm/ hot wearther,  can do house hold chores   ED (erectile dysfunction)    Emphysema/COPD (HCC)    followed by pcp;   uses breztri bid, uses preventil nebulizer every afternoon;   last  exacerbation 06/ 2023 in epic   Full dentures    GERD (gastroesophageal reflux disease)    Hepatic steatosis    History of adenomatous polyp of colon    History of gastric ulcer    per EGD 09-16-2020  non-bleeding   History of MI (myocardial infarction)    per nuclear stress test in epic 11-02-2019 showed prior inferior defect of severe severity indicative of previous MI   History of pelvic fracture    1999  and 2009   Hyperlipidemia    Hypertension    Smokers' cough (HCC)    Type 2 diabetes mellitus treated with insulin (HCC)    followed by pcp;  dx 2016   (02-27-2023  per pt daughter pt   Wears glasses     Past Surgical History:  Procedure Laterality Date   BIOPSY  09/16/2020   Procedure: BIOPSY;  Surgeon: Tressia Danas, MD;  Location: [facility];  Service: Gastroenterology;;   CHOLECYSTECTOMY N/A 03/20/2022   Procedure: LAPAROSCOPIC CHOLECYSTECTOMY;  Surgeon: Darnell Level, MD;  Location: [facility];  Service: General;  Laterality: N/A;   COLONOSCOPY WITH PROPOFOL  05/10/2018   dr Adela Lank   ESOPHAGOGASTRODUODENOSCOPY N/A 09/23/2015   Procedure: ESOPHAGOGASTRODUODENOSCOPY (EGD);  Surgeon: Meryl Dare, MD;  Location: [facility];  Service: Endoscopy;  Laterality: N/A;   ESOPHAGOGASTRODUODENOSCOPY (EGD) WITH PROPOFOL N/A 09/16/2020   Procedure: ESOPHAGOGASTRODUODENOSCOPY (EGD) WITH PROPOFOL;  Surgeon: Tressia Danas, MD;  Location: [facility];  Service: Gastroenterology;  Laterality: N/A;   EXCISION MASS NECK Left 03/09/2023   Procedure: EXCISION SOFT TISSUE MASS LEFT POSTERIOR NECK;  Surgeon: Darnell Level, MD;  Location: [facility];  Service: General;  Laterality: Left;   FOOT SURGERY Left 1999   INGUINAL HERNIA REPAIR Left 05/15/2018   Procedure: LEFT INGUINAL HERNIA REPAIR WITH MESH;  Surgeon: Harriette Bouillon, MD;  Location: [facility];  Service: General;  Laterality: Left;   INSERTION OF MESH Left 05/15/2018   Procedure: INSERTION OF MESH;  Surgeon: Harriette Bouillon, MD;   Location: [facility];  Service: General;  Laterality: Left;   INTRAOPERATIVE CHOLANGIOGRAM N/A 03/20/2022   Procedure: INTRAOPERATIVE CHOLANGIOGRAM;  Surgeon: Darnell Level, MD;  Location: [facility];  Service: General;  Laterality: N/A;   REVERSE SHOULDER ARTHROPLASTY Left 08/30/2022   Procedure: LEFT REVERSE SHOULDER ARTHROPLASTY;  Surgeon: Cammy Copa, MD;  Location: [facility];  Service: Orthopedics;  Laterality: Left;   SHOULDER ARTHROSCOPY W/ ROTATOR CUFF REPAIR Left    yrs ago    Social History   Socioeconomic History   Marital status: Significant Other  Spouse name: Ronita Hipps   Number of children: 6   Years of education: Not on file   Highest education level: 12th grade  Occupational History   Occupation: Employed    Employer: [employer]  Tobacco Use   Smoking status: Every Day    Current packs/day: 0.15    Average packs/day: 0.2 packs/day for 53.8 years (8.1 ttl pk-yrs)    Types: Cigarettes    Start date: 1971   Smokeless tobacco: Never   Tobacco comments:    02-27-2023  per pt daughter pt is still smoking, but hides how much per day,  smoking since age 6 (67)  Vaping Use   Vaping status: Never Used  Substance and Sexual Activity   Alcohol use: Yes    Alcohol/week: 2.0 standard drinks of alcohol    Types: 2 Cans of beer per week    Comment: 2 beers per week   Drug use: Never   Sexual activity: Yes  Other Topics Concern   Not on file  Social History Narrative   Lives with wife.  Employed full time as a Designer, fashion/clothing.  6 children.   Social Determinants of Health   Financial Resource Strain: Low Risk  (04/15/2023)   Overall Financial Resource Strain (CARDIA)    Difficulty of Paying Living Expenses: Not hard at all  Food Insecurity: Unknown (04/15/2023)   Hunger Vital Sign    Worried About Running Out of Food in the Last Year: Never true    Ran Out of Food in the Last Year: Patient declined  Transportation Needs: No Transportation Needs (04/15/2023)   PRAPARE -  Administrator, Civil Service (Medical): No    Lack of Transportation (Non-Medical): No  Physical Activity: Unknown (04/15/2023)   Exercise Vital Sign    Days of Exercise per Week: 1 day    Minutes of Exercise per Session: Patient declined  Stress: No Stress Concern Present (04/15/2023)   [employer] of Occupational Health - Occupational Stress Questionnaire    Feeling of Stress : Not at all  Social Connections: Socially Integrated (04/15/2023)   Social Connection and Isolation Panel [NHANES]    Frequency of Communication with Friends and Family: Three times a week    Frequency of Social Gatherings with Friends and Family: Twice a week    Attends Religious Services: 1 to 4 times per year    Active Member of [employer] or Organizations: No    Attends Engineer, structural: More than 4 times per year    Marital Status: Living with partner  Intimate Partner Violence: Not At Risk (12/28/2022)   Humiliation, Afraid, Rape, and Kick questionnaire    Fear of Current or Ex-Partner: No    Emotionally Abused: No    Physically Abused: No    Sexually Abused: No    Family History  Problem Relation Age of Onset   Emphysema Mother    Diabetes Mother    Emphysema Father    Cancer Sister        Stomach cancer   Diabetes Sister    Stomach cancer Sister    Diabetes Brother    Colon cancer Neg Hx    Colon polyps Neg Hx    Esophageal cancer Neg Hx    Rectal cancer Neg Hx      Review of Systems  Constitutional: Negative.  Negative for chills and fever.  HENT: Negative.  Negative for congestion and sore throat.   Respiratory: Negative.  Negative for cough and shortness  of breath.   Cardiovascular: Negative.  Negative for chest pain and palpitations.  Gastrointestinal:  Negative for abdominal pain, diarrhea, nausea and vomiting.  Genitourinary: Negative.  Negative for dysuria and hematuria.  Musculoskeletal:  Positive for joint pain (Right shoulder).  Skin: Negative.   Negative for rash.  Neurological: Negative.  Negative for dizziness and headaches.  All other systems reviewed and are negative.   Vitals:   04/17/23 1112  BP: 118/76  Pulse: 90  Temp: 98.4 F (36.9 C)  SpO2: 92%    Physical Exam Vitals reviewed.  Constitutional:      Appearance: Normal appearance.  HENT:     Head: Normocephalic.     Mouth/Throat:     Mouth: Mucous membranes are moist.     Pharynx: Oropharynx is clear.  Eyes:     Extraocular Movements: Extraocular movements intact.     Pupils: Pupils are equal, round, and reactive to light.  Cardiovascular:     Rate and Rhythm: Normal rate and regular rhythm.     Pulses: Normal pulses.     Heart sounds: Normal heart sounds.  Pulmonary:     Effort: Pulmonary effort is normal.     Breath sounds: Normal breath sounds.  Abdominal:     Palpations: Abdomen is soft.     Tenderness: There is no abdominal tenderness.  Musculoskeletal:     Cervical back: No tenderness.     Comments: Right shoulder: Tender hematoma.  Arm in a sling.  Lymphadenopathy:     Cervical: No cervical adenopathy.  Skin:    General: Skin is warm and dry.     Capillary Refill: Capillary refill takes less than 2 seconds.  Neurological:     General: No focal deficit present.     Mental Status: He is alert and oriented to person, place, and time.  Psychiatric:        Mood and Affect: Mood normal.        Behavior: Behavior normal.      ASSESSMENT & PLAN: A total of 43 minutes was spent with the patient and counseling/coordination of care regarding preparing for this visit, review of most recent office visit notes, review of most recent emergency department visit notes, review of most recent x-ray report, review of most recent blood work results, review of multiple chronic medical conditions under management, review of all medications, pain management, education on nutrition, smoking cessation advice, prognosis, documentation, and need for  follow-up.  Problem List Items Addressed This Visit       Cardiovascular and Mediastinum   CAD (coronary artery disease)    Stable without recent anginal episodes Continue daily baby aspirin and atorvastatin 20 mg daily      Hypertension associated with diabetes (HCC)    BP Readings from Last 3 Encounters:  04/17/23 118/76  04/14/23 135/89  03/09/23 122/82   Lab Results  Component Value Date   HGBA1C 7.5 (H) 03/09/2023   Well-controlled hypertension.  Continue amlodipine 5 mg daily Hemoglobin A1c slightly higher than before.  Not at goal but stable. Diet and nutrition discussed Cardiovascular risks associated with hypertension and diabetes discussed Continue weekly Trulicity 1.5 mg, daily Farxiga 10 mg, glipizide 10 mg before breakfast, Premeal insulin aspart as per sliding scale, and daily Tresiba 25 units        Respiratory   COPD (chronic obstructive pulmonary disease) (HCC)    Stable.  Continues to smoke.  Cardiovascular/cancer risks associated with smoking discussed.  Smoking cessation advice given Continues Breztri  2 inhalations twice a day and albuterol inhaler as needed         Endocrine   Dyslipidemia associated with type 2 diabetes mellitus (HCC)    Chronic stable conditions Continue atorvastatin 20 mg daily Diet and nutrition discussed        Other   Hematoma of right shoulder    Secondary to contusion. Advised to apply warm compresses and continue wearing sling Pain management discussed      Contusion of right shoulder - Primary    Still tender with inflammation. Recent x-ray report reviewed with patient Pain management discussed Recommend Tylenol for mild to moderate pain and tramadol for moderate to severe pain Stop OxyContin.      Relevant Medications   traMADol (ULTRAM) 50 MG tablet   Other Visit Diagnoses     Need for vaccination       Relevant Orders   Flu Vaccine Trivalent High Dose (Fluad) (Completed)        Patient  Instructions  Contusin Contusion Una contusin es un hematoma profundo. Es el resultado de una lesin que causa sangrado debajo de la piel. Los sntomas de hematoma incluyen dolor, hinchazn y cambio de color en la piel. La piel puede ponerse azul, morada o [place]. Siga estas indicaciones en su casa: Control del dolor, la rigidez y la hinchazn Puede usar RHCE. Esto significa: Reposo. Hielo. Compresin, o ejercer presin sobre la zona lesionada. Elevar, o poner en alto la zona lesionada. Para seguir este mtodo, haga lo siguiente: Mantenga la zona de la lesin en reposo. Aplique hielo sobre la zona lesionada si se lo indican. Para hacer esto: Ponga el hielo en una bolsa plstica. Coloque una toalla entre la piel y Copy. Aplique el hielo durante 20 minutos, 2 o 3 veces por da. Si la piel se le pone de color rojo brillante, retire el hielo de inmediato para evitar daos en la piel. El riesgo de dao en la piel es mayor si no puede sentir dolor, calor o fro. Si se lo indican, ejerza compresin en la zona de la lesin con una venda elstica. Asegrese de que la venda no est [place]. Si siente hormigueo o ha perdido la sensibilidad (adormecimiento) en la zona, qutesela y vuelva a colocarla como se lo haya indicado el mdico. Si es posible, cuando est sentado o acostado, eleve la zona lesionada por encima del nivel del corazn.  Indicaciones generales Use los medicamentos de venta libre y los recetados solamente como se lo haya indicado el mdico. Concurra a todas las visitas de seguimiento. Es posible que su mdico desee ver cmo est recuperndose la contusin con Scientist, research (medical). Comunquese con un mdico si: Los sntomas no mejoran despus de [address] de [place]. Sus sntomas empeoran. Tiene dificultad para mover la zona de la lesin. Solicite ayuda de inmediato si: Nurse, adult. Pierde la sensibilidad (adormecimiento) en una mano o un pie. La mano o el pie  estn plidos o fros. Esta informacin no tiene Theme park manager el consejo del mdico. Asegrese de hacerle al mdico cualquier pregunta que tenga. Document Revised: 01/03/2022 Document Reviewed: 01/03/2022 Elsevier Patient Education  2024 Elsevier Inc.      Edwina Barth, MD [facility]

## 2023-04-17 NOTE — Assessment & Plan Note (Signed)
Secondary to contusion. Advised to apply warm compresses and continue wearing sling Pain management discussed

## 2023-04-17 NOTE — Assessment & Plan Note (Signed)
BP Readings from Last 3 Encounters:  04/17/23 118/76  04/14/23 135/89  03/09/23 122/82   Lab Results  Component Value Date   HGBA1C 7.5 (H) 03/09/2023   Well-controlled hypertension.  Continue amlodipine 5 mg daily Hemoglobin A1c slightly higher than before.  Not at goal but stable. Diet and nutrition discussed Cardiovascular risks associated with hypertension and diabetes discussed Continue weekly Trulicity 1.5 mg, daily Farxiga 10 mg, glipizide 10 mg before breakfast, Premeal insulin aspart as per sliding scale, and daily Tresiba 25 units

## 2023-04-17 NOTE — Patient Instructions (Signed)
Contusin Contusion Una contusin es un hematoma profundo. Es el resultado de una lesin que causa sangrado debajo de la piel. Los sntomas de hematoma incluyen dolor, hinchazn y cambio de color en la piel. La piel puede ponerse azul, morada o [place]. Siga estas indicaciones en su casa: Control del dolor, la rigidez y la hinchazn Puede usar RHCE. Esto significa: Reposo. Hielo. Compresin, o ejercer presin sobre la zona lesionada. Elevar, o poner en alto la zona lesionada. Para seguir este mtodo, haga lo siguiente: Mantenga la zona de la lesin en reposo. Aplique hielo sobre la zona lesionada si se lo indican. Para hacer esto: Ponga el hielo en una bolsa plstica. Coloque una toalla entre la piel y Copy. Aplique el hielo durante 20 minutos, 2 o 3 veces por da. Si la piel se le pone de color rojo brillante, retire el hielo de inmediato para evitar daos en la piel. El riesgo de dao en la piel es mayor si no puede sentir dolor, calor o fro. Si se lo indican, ejerza compresin en la zona de la lesin con una venda elstica. Asegrese de que la venda no est [place]. Si siente hormigueo o ha perdido la sensibilidad (adormecimiento) en la zona, qutesela y vuelva a colocarla como se lo haya indicado el mdico. Si es posible, cuando est sentado o acostado, eleve la zona lesionada por encima del nivel del corazn.  Indicaciones generales Use los medicamentos de venta libre y los recetados solamente como se lo haya indicado el mdico. Concurra a todas las visitas de seguimiento. Es posible que su mdico desee ver cmo est recuperndose la contusin con Scientist, research (medical). Comunquese con un mdico si: Los sntomas no mejoran despus de [address] de [place]. Sus sntomas empeoran. Tiene dificultad para mover la zona de la lesin. Solicite ayuda de inmediato si: Nurse, adult. Pierde la sensibilidad (adormecimiento) en una mano o un pie. La mano o el pie estn plidos o  fros. Esta informacin no tiene Theme park manager el consejo del mdico. Asegrese de hacerle al mdico cualquier pregunta que tenga. Document Revised: 01/03/2022 Document Reviewed: 01/03/2022 Elsevier Patient Education  2024 [employer].

## 2023-04-17 NOTE — Assessment & Plan Note (Signed)
Still tender with inflammation. Recent x-ray report reviewed with patient Pain management discussed Recommend Tylenol for mild to moderate pain and tramadol for moderate to severe pain Stop OxyContin.

## 2023-04-17 NOTE — Assessment & Plan Note (Signed)
Chronic stable conditions Continue atorvastatin 20 mg daily Diet and nutrition discussed

## 2023-04-26 ENCOUNTER — Ambulatory Visit: Payer: Medicare HMO | Admitting: Orthopedic Surgery

## 2023-05-01 ENCOUNTER — Ambulatory Visit (INDEPENDENT_AMBULATORY_CARE_PROVIDER_SITE_OTHER): Payer: Medicare HMO | Admitting: Surgical

## 2023-05-01 ENCOUNTER — Other Ambulatory Visit: Payer: Self-pay | Admitting: Surgical

## 2023-05-01 DIAGNOSIS — M25511 Pain in right shoulder: Secondary | ICD-10-CM | POA: Diagnosis not present

## 2023-05-01 MED ORDER — HYDROCODONE-ACETAMINOPHEN 5-325 MG PO TABS: 1.0000 | ORAL_TABLET | Freq: Two times a day (BID) | ORAL | 0 refills | Status: DC | PRN

## 2023-05-02 ENCOUNTER — Encounter: Payer: Self-pay | Admitting: Surgical

## 2023-05-03 ENCOUNTER — Ambulatory Visit
Admission: RE | Admit: 2023-05-03 | Discharge: 2023-05-03 | Disposition: A | Discharge status: Home / Self Care | Payer: Medicare HMO | Source: Ambulatory Visit | Attending: Surgical | Admitting: Surgical

## 2023-05-03 DIAGNOSIS — M25511 Pain in right shoulder: Secondary | ICD-10-CM

## 2023-05-03 DIAGNOSIS — G8929 Other chronic pain: Secondary | ICD-10-CM | POA: Diagnosis not present

## 2023-05-03 NOTE — Progress Notes (Signed)
Hi I saw Duane Cuevas the other day, can we get him set up for shoulder injection?

## 2023-05-03 NOTE — Progress Notes (Signed)
Scheduled for 05/04/2023 at 8am.

## 2023-05-04 ENCOUNTER — Ambulatory Visit (INDEPENDENT_AMBULATORY_CARE_PROVIDER_SITE_OTHER): Payer: Medicare HMO | Admitting: Surgical

## 2023-05-04 ENCOUNTER — Encounter: Payer: Self-pay | Admitting: Surgical

## 2023-05-04 ENCOUNTER — Other Ambulatory Visit: Payer: Self-pay

## 2023-05-04 DIAGNOSIS — M19211 Secondary osteoarthritis, right shoulder: Secondary | ICD-10-CM | POA: Diagnosis not present

## 2023-05-04 MED ORDER — METHYLPREDNISOLONE ACETATE 40 MG/ML IJ SUSP
40.0000 mg | INTRAMUSCULAR | Status: AC | PRN
Start: 2023-05-04 — End: 2023-05-04
  Administered 2023-05-04: 40 mg via INTRA_ARTICULAR

## 2023-05-04 MED ORDER — BUPIVACAINE HCL 0.25 % IJ SOLN
9.0000 mL | INTRAMUSCULAR | Status: AC | PRN
Start: 2023-05-04 — End: 2023-05-04
  Administered 2023-05-04: 9 mL via INTRA_ARTICULAR

## 2023-05-04 MED ORDER — LIDOCAINE HCL 1 % IJ SOLN
5.0000 mL | INTRAMUSCULAR | Status: AC | PRN
Start: 2023-05-04 — End: 2023-05-04
  Administered 2023-05-04: 5 mL

## 2023-05-04 NOTE — Progress Notes (Signed)
Procedure Note  Patient: Duane Cuevas             Date of Birth: Nov 11, 1955           MRN: [id number]             Visit Date: 05/04/2023  Procedures: Visit Diagnoses:  1. 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  CT scan demonstrating no acute abnormality or fracture.  Does have severe rotator cuff arthropathy which has likely been exacerbated by the recent fall.  Plan to try glenohumeral injection today.  Follow-up in 4 weeks for clinical recheck with Dr. August Saucer to recheck how his pain is doing and if he has continued severe pain, may need to consider reverse shoulder arthroplasty 3 months following this injection.  Doing well with his left shoulder following reverse shoulder arthroplasty. Procedure, treatment alternatives, risks and benefits explained, specific risks discussed. Consent was given by the patient. Patient was prepped and draped in the usual sterile fashion.

## 2023-05-05 ENCOUNTER — Other Ambulatory Visit: Payer: Medicare HMO

## 2023-05-07 ENCOUNTER — Encounter: Payer: Self-pay | Admitting: Surgical

## 2023-05-07 NOTE — Progress Notes (Signed)
Office Visit Note   Patient: Duane Cuevas           Date of Birth: December 23, 1955           MRN: [id number] Visit Date: 05/01/2023 Requested by: Georgina Quint, MD [address] [place],  [place] [address] PCP: Georgina Quint, MD  Subjective: Chief Complaint  Patient presents with   Right Shoulder - Pain   Left Shoulder - Follow-up    08/30/2022 Left RSA    HPI: Duane Cuevas is a 67 y.o. male who presents to the office reporting right shoulder pain.  Patient states that he fell and hit his shoulder about a week ago.  Localizes pain to the lateral aspect of the shoulder.  He went to the emergency department where he had radiographs demonstrating rotator cuff arthropathy and arthritis of the shoulder with no acute changes.  He had no pain prior to his fall.  No history of surgery on the shoulder.  Left reverse shoulder arthroplasty from February 2024 is doing well and he is very functional with this arm.  Regarding the right shoulder he has difficulty lifting his arm which is new.  Pain will wake him up at night.  No neck pain or scapular pain.  No fevers or chills..                ROS: All systems reviewed are negative as they relate to the chief complaint within the history of present illness.  Patient denies fevers or chills.  Assessment & Plan: Visit Diagnoses:  1. Acute pain of right shoulder     Plan: Patient is a 67 year old male who presents for evaluation of right shoulder pain following a fall.  Has history of rotator cuff arthropathy that is evident on radiographs from the emergency department.  However, he has severe pain that is unrelenting since the fall.  With the potential that he will undergo right reverse shoulder arthroplasty in the future due to how well his left shoulder is functioning, plan for evaluation with CT scan to rule out fracture and if this is negative we can try glenohumeral injection into the right shoulder.  He is not at the point where he  wants to consider surgery for his right shoulder yet.  Follow-Up Instructions: No follow-ups on file.   Orders:  Orders Placed This Encounter  Procedures   CT SHOULDER RIGHT WO CONTRAST   No orders of the defined types were placed in this encounter.     Procedures: No procedures performed   Clinical Data: No additional findings.  Objective: Vital Signs: There were no vitals taken for this visit.  Physical Exam:  Constitutional: Patient appears well-developed HEENT:  Head: Normocephalic Eyes:EOM are normal Neck: Normal range of motion Cardiovascular: Normal rate Pulmonary/chest: Effort normal Neurologic: Patient is alert Skin: Skin is warm Psychiatric: Patient has normal mood and affect  Ortho Exam: Ortho exam demonstrates right shoulder with severe pain with passive motion of the shoulder.  He has shoulder effusion that looks to be present.  No tenderness over the acromion.  Does have tenderness diffusely throughout the lateral anterior aspect of the shoulder.  Intact EPL, FPL, finger abduction.  Actually nerve is intact with deltoid firing.  Significant weakness of the rotator cuff is present.  No cellulitis or skin changes noted.  Specialty Comments:  No specialty comments available.  Imaging: No results found.   PMFS History: Patient Active Problem List   Diagnosis Date Noted   Hematoma of  right shoulder 04/17/2023   Contusion of right shoulder 04/17/2023   Rotator cuff arthropathy, left 08/31/2022   S/P reverse total shoulder arthroplasty, left 08/30/2022   Seizure (HCC) 12/09/2021   Intractable persistent migraine aura without cerebral infarction and with status migrainosus 05/26/2021   Hypertension associated with diabetes (HCC) 09/21/2020   Uncontrolled type 2 diabetes mellitus with hyperglycemia (HCC) 09/13/2020   CAD (coronary artery disease) 01/23/2020   HLD (hyperlipidemia) 11/02/2019   History of MI (myocardial infarction) 11/02/2019   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   Coronary artery disease    (pt admitted for chest pain ) nuclear stress test 11-02-2019  low risk, nuclear ef 58%,  prior inferior defect of severe severity indicative of previous MI;;   echo 04-28-2020 ef 60-65%, mild concentric LVH, RSVP 54.50mmHg;;   cardiac CTA 09-15-2020  unremarkable,  calcium score=zero   Dependence on nocturnal oxygen therapy    02-27-2023  per pt daughter,  pt only uses at night w/ O2 sat ,2-3L via [place],  only uses portable if going outside when every hot which is not much   Diverticulosis of colon    DOE (dyspnea on exertion)    02-27-2023  per pt daugter sob w/ stairs, incline's,  ok w/ some yard work if not warm/ hot wearther,  can do house hold chores   ED (erectile dysfunction)    Emphysema/COPD (HCC)    followed by pcp;   uses breztri bid, uses preventil nebulizer every afternoon;   last exacerbation 06/ 2023 in epic   Full dentures    GERD (gastroesophageal reflux disease)    Hepatic steatosis    History of adenomatous polyp of colon    History of gastric ulcer    per EGD 09-16-2020  non-bleeding   History of MI (myocardial infarction)    per nuclear stress test in epic 11-02-2019 showed prior inferior defect of severe severity indicative of previous MI   History of pelvic fracture    1999  and 2009   Hyperlipidemia    Hypertension    Smokers' cough (HCC)    Type 2 diabetes mellitus treated with insulin (HCC)    followed by pcp;  dx 2016   (02-27-2023  per pt daughter pt   Wears glasses     Family History  Problem Relation Age of Onset   Emphysema Mother    Diabetes  Mother    Emphysema Father    Cancer Sister        Stomach cancer   Diabetes Sister    Stomach cancer Sister    Diabetes Brother    Colon cancer Neg Hx    Colon polyps Neg Hx    Esophageal cancer Neg Hx    Rectal cancer Neg Hx     Past Surgical History:  Procedure Laterality Date   BIOPSY  09/16/2020   Procedure: BIOPSY;  Surgeon: Tressia Danas, MD;  Location: [facility];  Service: Gastroenterology;;   CHOLECYSTECTOMY N/A 03/20/2022   Procedure: LAPAROSCOPIC CHOLECYSTECTOMY;  Surgeon: Darnell Level, MD;  Location: [facility];  Service: General;  Laterality: N/A;   COLONOSCOPY WITH PROPOFOL  05/10/2018   dr  armbruster   ESOPHAGOGASTRODUODENOSCOPY N/A 09/23/2015   Procedure: ESOPHAGOGASTRODUODENOSCOPY (EGD);  Surgeon: Meryl Dare, MD;  Location: [facility];  Service: Endoscopy;  Laterality: N/A;   ESOPHAGOGASTRODUODENOSCOPY (EGD) WITH PROPOFOL N/A 09/16/2020   Procedure: ESOPHAGOGASTRODUODENOSCOPY (EGD) WITH PROPOFOL;  Surgeon: Tressia Danas, MD;  Location: [facility];  Service: Gastroenterology;  Laterality: N/A;   EXCISION MASS NECK Left 03/09/2023   Procedure: EXCISION SOFT TISSUE MASS LEFT POSTERIOR NECK;  Surgeon: Darnell Level, MD;  Location: [facility];  Service: General;  Laterality: Left;   FOOT SURGERY Left 1999   INGUINAL HERNIA REPAIR Left 05/15/2018   Procedure: LEFT INGUINAL HERNIA REPAIR WITH MESH;  Surgeon: Harriette Bouillon, MD;  Location: [facility];  Service: General;  Laterality: Left;   INSERTION OF MESH Left 05/15/2018   Procedure: INSERTION OF MESH;  Surgeon: Harriette Bouillon, MD;  Location: [facility];  Service: General;  Laterality: Left;   INTRAOPERATIVE CHOLANGIOGRAM N/A 03/20/2022   Procedure: INTRAOPERATIVE CHOLANGIOGRAM;  Surgeon: Darnell Level, MD;  Location: [facility];  Service: General;  Laterality: N/A;   REVERSE SHOULDER ARTHROPLASTY Left 08/30/2022   Procedure: LEFT REVERSE SHOULDER ARTHROPLASTY;  Surgeon: Cammy Copa, MD;  Location: [facility];  Service:  Orthopedics;  Laterality: Left;   SHOULDER ARTHROSCOPY W/ ROTATOR CUFF REPAIR Left    yrs ago   Social History   Occupational History   Occupation: Employed    Employer: [employer]  Tobacco Use   Smoking status: Every Day    Current packs/day: 0.15    Average packs/day: 0.2 packs/day for 53.8 years (8.1 ttl pk-yrs)    Types: Cigarettes    Start date: 1971   Smokeless tobacco: Never   Tobacco comments:    02-27-2023  per pt daughter pt is still smoking, but hides how much per day,  smoking since age 79 (11)  Vaping Use   Vaping status: Never Used  Substance and Sexual Activity   Alcohol use: Yes    Alcohol/week: 2.0 standard drinks of alcohol    Types: 2 Cans of beer per week    Comment: 2 beers per week   Drug use: Never   Sexual activity: Yes

## 2023-05-10 ENCOUNTER — Encounter: Payer: Self-pay | Admitting: Gastroenterology

## 2023-05-11 ENCOUNTER — Other Ambulatory Visit: Payer: Self-pay

## 2023-05-25 ENCOUNTER — Ambulatory Visit: Payer: Medicare HMO | Admitting: Orthopedic Surgery

## 2023-05-25 ENCOUNTER — Encounter: Payer: Self-pay | Admitting: Orthopedic Surgery

## 2023-05-25 DIAGNOSIS — M19211 Secondary osteoarthritis, right shoulder: Secondary | ICD-10-CM

## 2023-05-25 DIAGNOSIS — M12812 Other specific arthropathies, not elsewhere classified, left shoulder: Secondary | ICD-10-CM

## 2023-05-25 NOTE — Progress Notes (Signed)
Office Visit Note   Patient: Duane Cuevas           Date of Birth: 02/23/1956           MRN: [id number] Visit Date: 05/25/2023 Requested by: Georgina Quint, MD [address] [place],  [place] [address] PCP: Georgina Quint, MD  Subjective: Chief Complaint  Patient presents with   Right Shoulder - Follow-up    HPI: Sahwn Cuevas is a 67 y.o. male who presents to the office reporting right shoulder pain.  Patient had right glenohumeral joint injection 05/04/2023.  That did not give him sustained relief.  Right shoulder wakes him from sleep at night.  He is doing reasonly well with left reverse shoulder replacement.  Does have a cardiac history..                ROS: All systems reviewed are negative as they relate to the chief complaint within the history of present illness.  Patient denies fevers or chills.  Assessment & Plan: Visit Diagnoses:  1. 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  Has failed conservative management.  Think that CT scan pending for patient specific instrumentation.  Will need to wait until the end of January to get this done.  Risk and benefits are discussed including not limited to infection nerve vessel damage incomplete pain relief as well as incomplete functional restoration.  Patient understands and wishes to proceed.  All questions answered  Follow-Up Instructions: No follow-ups on file.   Orders:  Orders Placed This Encounter  Procedures   CT SHOULDER RIGHT WO CONTRAST   No orders of the defined types were placed in this encounter.     Procedures: No procedures performed   Clinical Data: No additional findings.  Objective: Vital Signs: There were no vitals taken for this visit.  Physical Exam:  Constitutional: Patient appears well-developed HEENT:  Head: Normocephalic Eyes:EOM are normal Neck:  Normal range of motion Cardiovascular: Normal rate Pulmonary/chest: Effort normal Neurologic: Patient is alert Skin: Skin is warm Psychiatric: Patient has normal mood and affect  Ortho Exam: Ortho exam demonstrates a lot of grinding in that right shoulder with passive range of motion.  Deltoid is functional.  Motor or sensory function to the right hand is intact radial pulses intact.  Patient has fairly profound weakness to external rotation strength testing at 3- out of 5 with reasonable subscap strength.  No masses lymphadenopathy or skin changes noted in that shoulder girdle region.  Specialty Comments:  No specialty comments available.  Imaging: No results found.   PMFS History: Patient Active Problem List   Diagnosis Date Noted   Hematoma of right shoulder 04/17/2023   Contusion of right shoulder 04/17/2023   Rotator cuff arthropathy, left 08/31/2022   S/P reverse total shoulder arthroplasty, left 08/30/2022   Seizure (HCC) 12/09/2021   Intractable persistent migraine aura without cerebral infarction and with status migrainosus 05/26/2021   Hypertension associated with diabetes (HCC) 09/21/2020   Uncontrolled type 2 diabetes mellitus with hyperglycemia (HCC) 09/13/2020   CAD (coronary artery disease) 01/23/2020   HLD (hyperlipidemia) 11/02/2019   History of MI (myocardial infarction) 11/02/2019   COPD ? GOLD III/ active smoker 08/14/2018   COPD (chronic obstructive pulmonary disease) (HCC) 10/14/2014   Dyslipidemia associated with type 2 diabetes mellitus (HCC) 10/14/2014   Past Medical History:  Diagnosis Date   Cerebral aneurysm,  nonruptured    followed by dr n. 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   Coronary artery disease    (pt admitted for chest pain ) nuclear stress test 11-02-2019  low risk, nuclear ef 58%,  prior inferior defect of severe severity indicative of previous MI;;    echo 04-28-2020 ef 60-65%, mild concentric LVH, RSVP 54.45mmHg;;   cardiac CTA 09-15-2020  unremarkable,  calcium score=zero   Dependence on nocturnal oxygen therapy    02-27-2023  per pt daughter,  pt only uses at night w/ O2 sat ,2-3L via [place],  only uses portable if going outside when every hot which is not much   Diverticulosis of colon    DOE (dyspnea on exertion)    02-27-2023  per pt daugter sob w/ stairs, incline's,  ok w/ some yard work if not warm/ hot wearther,  can do house hold chores   ED (erectile dysfunction)    Emphysema/COPD (HCC)    followed by pcp;   uses breztri bid, uses preventil nebulizer every afternoon;   last exacerbation 06/ 2023 in epic   Full dentures    GERD (gastroesophageal reflux disease)    Hepatic steatosis    History of adenomatous polyp of colon    History of gastric ulcer    per EGD 09-16-2020  non-bleeding   History of MI (myocardial infarction)    per nuclear stress test in epic 11-02-2019 showed prior inferior defect of severe severity indicative of previous MI   History of pelvic fracture    1999  and 2009   Hyperlipidemia    Hypertension    Smokers' cough (HCC)    Type 2 diabetes mellitus treated with insulin (HCC)    followed by pcp;  dx 2016   (02-27-2023  per pt daughter pt   Wears glasses     Family History  Problem Relation Age of Onset   Emphysema Mother    Diabetes Mother    Emphysema Father    Cancer Sister        Stomach cancer   Diabetes Sister    Stomach cancer Sister    Diabetes Brother    Colon cancer Neg Hx    Colon polyps Neg Hx    Esophageal cancer Neg Hx    Rectal cancer Neg Hx     Past Surgical History:  Procedure Laterality Date   BIOPSY  09/16/2020   Procedure: BIOPSY;  Surgeon: Tressia Danas, MD;  Location: [facility];  Service: Gastroenterology;;   CHOLECYSTECTOMY N/A 03/20/2022   Procedure: LAPAROSCOPIC CHOLECYSTECTOMY;  Surgeon: Darnell Level, MD;  Location: [facility];  Service: General;  Laterality: N/A;    COLONOSCOPY WITH PROPOFOL  05/10/2018   dr Adela Lank   ESOPHAGOGASTRODUODENOSCOPY N/A 09/23/2015   Procedure: ESOPHAGOGASTRODUODENOSCOPY (EGD);  Surgeon: Meryl Dare, MD;  Location: [facility];  Service: Endoscopy;  Laterality: N/A;   ESOPHAGOGASTRODUODENOSCOPY (EGD) WITH PROPOFOL N/A 09/16/2020   Procedure: ESOPHAGOGASTRODUODENOSCOPY (EGD) WITH PROPOFOL;  Surgeon: Tressia Danas, MD;  Location: [facility];  Service: Gastroenterology;  Laterality: N/A;   EXCISION MASS NECK Left 03/09/2023   Procedure: EXCISION SOFT TISSUE MASS LEFT POSTERIOR NECK;  Surgeon: Darnell Level, MD;  Location: [facility];  Service: General;  Laterality: Left;   FOOT SURGERY Left 1999   INGUINAL HERNIA REPAIR Left 05/15/2018   Procedure: LEFT INGUINAL HERNIA REPAIR WITH MESH;  Surgeon: Harriette Bouillon, MD;  Location: [facility];  Service: General;  Laterality:  Left;   INSERTION OF MESH Left 05/15/2018   Procedure: INSERTION OF MESH;  Surgeon: Harriette Bouillon, MD;  Location: [facility];  Service: General;  Laterality: Left;   INTRAOPERATIVE CHOLANGIOGRAM N/A 03/20/2022   Procedure: INTRAOPERATIVE CHOLANGIOGRAM;  Surgeon: Darnell Level, MD;  Location: [facility];  Service: General;  Laterality: N/A;   REVERSE SHOULDER ARTHROPLASTY Left 08/30/2022   Procedure: LEFT REVERSE SHOULDER ARTHROPLASTY;  Surgeon: Cammy Copa, MD;  Location: [facility];  Service: Orthopedics;  Laterality: Left;   SHOULDER ARTHROSCOPY W/ ROTATOR CUFF REPAIR Left    yrs ago   Social History   Occupational History   Occupation: Employed    Employer: [employer]  Tobacco Use   Smoking status: Every Day    Current packs/day: 0.15    Average packs/day: 0.2 packs/day for 53.9 years (8.1 ttl pk-yrs)    Types: Cigarettes    Start date: 1971   Smokeless tobacco: Never   Tobacco comments:    02-27-2023  per pt daughter pt is still smoking, but hides how much per day,  smoking since age 10 (62)  Vaping Use   Vaping status: Never Used   Substance and Sexual Activity   Alcohol use: Yes    Alcohol/week: 2.0 standard drinks of alcohol    Types: 2 Cans of beer per week    Comment: 2 beers per week   Drug use: Never   Sexual activity: Yes

## 2023-05-26 ENCOUNTER — Encounter: Payer: Self-pay | Admitting: Orthopedic Surgery

## 2023-06-16 ENCOUNTER — Encounter: Payer: Self-pay | Admitting: Emergency Medicine

## 2023-06-16 ENCOUNTER — Other Ambulatory Visit: Payer: Self-pay | Admitting: Radiology

## 2023-06-16 ENCOUNTER — Ambulatory Visit
Admission: RE | Admit: 2023-06-16 | Discharge: 2023-06-16 | Disposition: A | Discharge status: Home / Self Care | Payer: Medicare HMO | Source: Ambulatory Visit | Attending: Orthopedic Surgery | Admitting: Orthopedic Surgery

## 2023-06-16 DIAGNOSIS — M19211 Secondary osteoarthritis, right shoulder: Secondary | ICD-10-CM

## 2023-06-16 DIAGNOSIS — M19011 Primary osteoarthritis, right shoulder: Secondary | ICD-10-CM | POA: Diagnosis not present

## 2023-06-16 DIAGNOSIS — J439 Emphysema, unspecified: Secondary | ICD-10-CM

## 2023-06-16 MED ORDER — BREZTRI AEROSPHERE 160-9-4.8 MCG/ACT IN AERO: INHALATION_SPRAY | RESPIRATORY_TRACT | 10 refills | Status: DC

## 2023-06-30 NOTE — Progress Notes (Signed)
Blue sheet done

## 2023-07-03 ENCOUNTER — Ambulatory Visit: Payer: Medicare HMO | Admitting: Emergency Medicine

## 2023-07-10 ENCOUNTER — Ambulatory Visit: Payer: Medicare HMO | Admitting: Emergency Medicine

## 2023-07-18 ENCOUNTER — Other Ambulatory Visit: Payer: Self-pay | Admitting: Radiology

## 2023-07-18 ENCOUNTER — Ambulatory Visit: Payer: Medicare HMO | Admitting: Emergency Medicine

## 2023-07-18 DIAGNOSIS — E1165 Type 2 diabetes mellitus with hyperglycemia: Secondary | ICD-10-CM

## 2023-07-18 MED ORDER — TRULICITY 1.5 MG/0.5ML ~~LOC~~ SOAJ: SUBCUTANEOUS | 10 refills | Status: DC

## 2023-08-02 ENCOUNTER — Telehealth: Payer: Self-pay | Admitting: Emergency Medicine

## 2023-08-02 ENCOUNTER — Ambulatory Visit: Payer: Medicare HMO | Admitting: Emergency Medicine

## 2023-08-02 ENCOUNTER — Telehealth: Payer: Self-pay | Admitting: Orthopedic Surgery

## 2023-08-02 VITALS — BP 118/68 | HR 103 | Temp 98.1°F | Ht 68.0 in | Wt 164.0 lb

## 2023-08-02 DIAGNOSIS — I2510 Atherosclerotic heart disease of native coronary artery without angina pectoris: Secondary | ICD-10-CM

## 2023-08-02 DIAGNOSIS — E1159 Type 2 diabetes mellitus with other circulatory complications: Secondary | ICD-10-CM

## 2023-08-02 DIAGNOSIS — I152 Hypertension secondary to endocrine disorders: Secondary | ICD-10-CM | POA: Diagnosis not present

## 2023-08-02 DIAGNOSIS — E785 Hyperlipidemia, unspecified: Secondary | ICD-10-CM | POA: Diagnosis not present

## 2023-08-02 DIAGNOSIS — Z7985 Long-term (current) use of injectable non-insulin antidiabetic drugs: Secondary | ICD-10-CM | POA: Diagnosis not present

## 2023-08-02 DIAGNOSIS — E1165 Type 2 diabetes mellitus with hyperglycemia: Secondary | ICD-10-CM

## 2023-08-02 DIAGNOSIS — E1169 Type 2 diabetes mellitus with other specified complication: Secondary | ICD-10-CM | POA: Diagnosis not present

## 2023-08-02 DIAGNOSIS — Z7984 Long term (current) use of oral hypoglycemic drugs: Secondary | ICD-10-CM | POA: Diagnosis not present

## 2023-08-02 DIAGNOSIS — J439 Emphysema, unspecified: Secondary | ICD-10-CM

## 2023-08-02 LAB — POCT GLYCOSYLATED HEMOGLOBIN (HGB A1C): Hemoglobin A1C: 6.7 % — AB (ref 4.0–5.6)

## 2023-08-02 MED ORDER — NICOTINE 14 MG/24HR TD PT24: 14.0000 mg | MEDICATED_PATCH | Freq: Every day | TRANSDERMAL | 0 refills | Status: DC

## 2023-08-02 MED ORDER — IBUPROFEN 600 MG PO TABS: 600.0000 mg | ORAL_TABLET | Freq: Three times a day (TID) | ORAL | 0 refills | Status: DC | PRN

## 2023-08-02 MED ORDER — NOVOLOG FLEXPEN 100 UNIT/ML ~~LOC~~ SOPN: PEN_INJECTOR | SUBCUTANEOUS | 10 refills | Status: AC

## 2023-08-02 NOTE — Assessment & Plan Note (Signed)
Chronic stable conditions Continue atorvastatin 20 mg daily Diet and nutrition discussed

## 2023-08-02 NOTE — Telephone Encounter (Signed)
Duane Cuevas is a patient with right shoulder rotator cuff arthropathy.  He has absent rotator cuff function of the posterior superior cuff with profound weakness.  He is not able to achieve anywhere near 90 degrees of forward flexion or abduction.  Radiographically he has abutment of the humeral head underneath the acromion with bony spurring and sclerosis.  Axillary nerve is functional.  As with his left shoulder physical therapy will be of no benefit in achieving any type of functional motion or pain relief.  He has failed treatment with an injection.  He also had a fall in October with no fracture but this also exacerbated his symptoms.  Therapy is not indicated for this patient because it will not give him any relief.  He is pleased with his left reverse shoulder replacement and his right shoulder radiographically and clinically looks the same.  For that reason I do not think physical therapy is indicated as a preamble to [place] undergoing reverse shoulder replacement for pain relief and increased function.

## 2023-08-02 NOTE — Assessment & Plan Note (Signed)
Well-controlled hypertension.  Continue amlodipine 5 mg daily Well-controlled diabetes with hemoglobin A1c of 6.7 Diet and nutrition discussed Cardiovascular risks associated with hypertension and diabetes discussed Continue weekly Trulicity 1.5 mg, daily Farxiga 10 mg, glipizide 10 mg before breakfast, Premeal insulin aspart as per sliding scale, and daily Tresiba 25 units

## 2023-08-02 NOTE — Progress Notes (Signed)
Duane Cuevas 67 y.o.   Chief Complaint  Patient presents with   Follow-up    6 month f/u for HTN and DM     HISTORY OF PRESENT ILLNESS: This is a 68 y.o. male here for 21-month follow-up of multiple chronic medical conditions Overall doing well. Still struggling with right shoulder.  Scheduled for surgery next month Back to smoking 6 7 cigarettes/day due to recent loss of his mother.  Requesting NicoDerm patches. No other complaints or medical concerns today Lab Results  Component Value Date   HGBA1C 7.5 (H) 03/09/2023   BP Readings from Last 3 Encounters:  08/02/23 118/68  04/17/23 118/76  04/14/23 135/89     HPI   Prior to Admission medications   Medication Sig Start Date End Date Taking? Authorizing Provider  albuterol (PROVENTIL) (2.5 MG/3ML) 0.083% nebulizer solution INHALAR 1 FRASCO VIA NEBULIZADOR CADA 6 HORAS SEGUN SEA NECESARIO PARA SIBILANCIAS O FALTA DE AIRE Patient taking differently: Take 2.5 mg by nebulization 3 (three) times a week. INHALAR 1 FRASCO VIA NEBULIZADOR CADA 6 HORAS SEGUN SEA NECESARIO PARA SIBILANCIAS O FALTA DE AIRE 12/07/22  Yes Rabia Argote, Eilleen Kempf, MD  albuterol (VENTOLIN HFA) 108 (90 Base) MCG/ACT inhaler INHALAR 2 BOCANADAS POR VIA ORAL CADA 6 HORAS SEGUN SEA NECESARIO PARA SIBILANCIAS O FALTA DE AIRE Patient taking differently: Inhale 1-2 puffs into the lungs every 6 (six) hours as needed for wheezing or shortness of breath. 10/07/22  Yes Mekia Dipinto, Eilleen Kempf, MD  amLODipine (NORVASC) 5 MG tablet TOMAR 1 TABLETA POR VAI ORAL UNA VEZ AL DIA Patient taking differently: Take 5 mg by mouth daily. 12/07/22  Yes SagardiaEilleen Kempf, MD  aspirin EC 81 MG tablet Take 1 tablet (81 mg total) by mouth daily. Swallow whole. 09/01/22  Yes Magnant, Charles L, PA-C  atorvastatin (LIPITOR) 20 MG tablet TOMAR 1 TABLETA POR VIA ORAL UNA VEZ AL DIA Patient taking differently: Take 20 mg by mouth daily. 10/07/22  Yes Helmuth Recupero, Eilleen Kempf, MD  blood glucose  meter kit and supplies KIT Dispense based on patient and insurance preference. Use up to four times daily as directed. 10/22/20  Yes Laden Fieldhouse, Eilleen Kempf, MD  blood glucose meter kit and supplies Dispense based on patient and insurance preference. Use up to four times daily as directed. (FOR ICD-10 E10.9, E11.9). 10/24/20  Yes Marlaysia Lenig, Eilleen Kempf, MD  Blood Glucose Monitoring Suppl (ONE TOUCH ULTRA 2) w/Device KIT Use as directed to check blood glucose up to 2 times a day 12/29/22  Yes Garrie Elenes, Eilleen Kempf, MD  Budeson-Glycopyrrol-Formoterol (BREZTRI AEROSPHERE) 160-9-4.8 MCG/ACT AERO INHALAR 2 BOCANADAS POR VIA ORAL DOS VECES AL DIA 06/16/23  Yes Georgina Quint, MD  Dulaglutide (TRULICITY) 1.5 MG/0.5ML SOAJ INYECTAR CONTENIDOS DE UN LAPIZ POR VIA SUBCUTANEA CADA SEMANA, EL MISMO DIA CADA [facility] 07/18/23  Yes Lashala Laser, Poston, MD  FARXIGA 10 MG TABS tablet TOMAR 1 TABLETA POR VIA ORAL UNA VEZ AL DIA Patient taking differently: Take 10 mg by mouth daily. 02/06/23  Yes Glinda Natzke, Eilleen Kempf, MD  glipiZIDE (GLUCOTROL) 10 MG tablet TOMAR 1 TABLETA POR VIA ORAL UNA VEZ AL DIA ANTES DEL DESAYUNO *TOMAR 1 TABLETA ADICIONAL SI AZUCAR EN LA SANGRE ESTA ALTA* Patient taking differently: Take 10 mg by mouth daily before breakfast. TOMAR 1 TABLETA POR VIA ORAL UNA VEZ AL DIA ANTES DEL DESAYUNO *TOMAR 1 TABLETA ADICIONAL SI AZUCAR EN LA SANGRE ESTA ALTA* 09/14/22  Yes Moise Friday, Eilleen Kempf, MD  HYDROcodone-acetaminophen (NORCO/VICODIN) 5-325 MG tablet Take  1 tablet by mouth every 12 (twelve) hours as needed for moderate pain (pain score 4-6). 05/01/23 04/30/24 Yes Magnant, Charles L, PA-C  insulin aspart (NOVOLOG FLEXPEN) 100 UNIT/ML FlexPen INYECTAR 3 UNIDADES POR VIA SUBCUTANEA TRES VECES AL DIA COMO INDICADO. AJUSTAR CANTIDAD DE INSULINA POR ESCALA MOVIL. MAS DOSIS 30 UNIDADES Patient taking differently: Inject 3 Units into the skin 3 (three) times daily with meals. As directed uses SS 07/12/22  Yes  Aleecia Tapia, Eilleen Kempf, MD  Insulin Pen Needle (B-D ULTRAFINE III SHORT PEN) 31G X 8 MM MISC USAR PARA INYECTAR INSULINA 10/13/21  Yes Jonmichael Beadnell, Eilleen Kempf, MD  Insulin Pen Needle (B-D ULTRAFINE III SHORT PEN) 31G X 8 MM MISC USAR PARA INYECTAR INSULINA 10/13/21  Yes Diamone Whistler, Eilleen Kempf, MD  methocarbamol (ROBAXIN) 500 MG tablet Take 1 tablet (500 mg total) by mouth 2 (two) times daily. 04/14/23  Yes Lorre Nick, MD  [facility] ULTRA test strip USE TO CHECK BLOOD SUGAR AS INSTRUCTED 01/02/23  Yes Demeisha Geraghty, Eilleen Kempf, MD  pantoprazole (PROTONIX) 40 MG tablet TOMAR 1 TABLETA POR VIA ORAL UNA VEZ AL DIA Patient taking differently: Take 40 mg by mouth daily. 01/09/23  Yes Terral Cooks, Eilleen Kempf, MD  sildenafil (VIAGRA) 100 MG tablet Take 0.5-1 tablets (50-100 mg total) by mouth daily as needed for erectile dysfunction. Patient taking differently: Take 50-100 mg by mouth daily as needed for erectile dysfunction. 12/29/22  Yes Georgina Quint, MD  TRESIBA FLEXTOUCH 100 UNIT/ML FlexTouch Pen INYECTAR 25 UNIDADES POR VIA SUBCUTANEA DIARIAMENTE 04/10/23  Yes Mak Bonny, Eilleen Kempf, MD  Ubrogepant (UBRELVY) 100 MG TABS TOMAR 1 TALBETA POR VIA ORAL UNA VEZ AL DIA 07/11/22  Yes Jewell Ryans, Eilleen Kempf, MD  gabapentin (NEURONTIN) 300 MG capsule Take 1 capsule (300 mg total) by mouth 2 (two) times daily. Patient not taking: Reported on 03/03/2023 09/14/22   Cammy Copa, MD    No Known Allergies  Patient Active Problem List   Diagnosis Date Noted   Hematoma of right shoulder 04/17/2023   Contusion of right shoulder 04/17/2023   Rotator cuff arthropathy, left 08/31/2022   S/P reverse total shoulder arthroplasty, left 08/30/2022   Seizure (HCC) 12/09/2021   Intractable persistent migraine aura without cerebral infarction and with status migrainosus 05/26/2021   Hypertension associated with diabetes (HCC) 09/21/2020   Uncontrolled type 2 diabetes mellitus with hyperglycemia (HCC) 09/13/2020   CAD  (coronary artery disease) 01/23/2020   HLD (hyperlipidemia) 11/02/2019   History of MI (myocardial infarction) 11/02/2019   COPD ? GOLD III/ active smoker 08/14/2018   COPD (chronic obstructive pulmonary disease) (HCC) 10/14/2014   Dyslipidemia associated with type 2 diabetes mellitus (HCC) 10/14/2014    Past Medical History:  Diagnosis Date   Cerebral aneurysm, nonruptured    followed by dr n. 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   Coronary artery disease    (pt admitted for chest pain ) nuclear stress test 11-02-2019  low risk, nuclear ef 58%,  prior inferior defect of severe severity indicative of previous MI;;   echo 04-28-2020 ef 60-65%, mild concentric LVH, RSVP 54.8mmHg;;   cardiac CTA 09-15-2020  unremarkable,  calcium score=zero   Dependence on nocturnal oxygen therapy    02-27-2023  per pt daughter,  pt only uses at night w/ O2 sat ,2-3L via [place],  only uses portable if going outside when every hot which is not much   Diverticulosis of colon    DOE (  dyspnea on exertion)    02-27-2023  per pt daugter sob w/ stairs, incline's,  ok w/ some yard work if not warm/ hot wearther,  can do house hold chores   ED (erectile dysfunction)    Emphysema/COPD (HCC)    followed by pcp;   uses breztri bid, uses preventil nebulizer every afternoon;   last exacerbation 06/ 2023 in epic   Full dentures    GERD (gastroesophageal reflux disease)    Hepatic steatosis    History of adenomatous polyp of colon    History of gastric ulcer    per EGD 09-16-2020  non-bleeding   History of MI (myocardial infarction)    per nuclear stress test in epic 11-02-2019 showed prior inferior defect of severe severity indicative of previous MI   History of pelvic fracture    1999  and 2009   Hyperlipidemia    Hypertension    Smokers' cough (HCC)    Type 2 diabetes mellitus treated with insulin (HCC)    followed by pcp;  dx 2016    (02-27-2023  per pt daughter pt   Wears glasses     Past Surgical History:  Procedure Laterality Date   BIOPSY  09/16/2020   Procedure: BIOPSY;  Surgeon: Tressia Danas, MD;  Location: [facility];  Service: Gastroenterology;;   CHOLECYSTECTOMY N/A 03/20/2022   Procedure: LAPAROSCOPIC CHOLECYSTECTOMY;  Surgeon: Darnell Level, MD;  Location: [facility];  Service: General;  Laterality: N/A;   COLONOSCOPY WITH PROPOFOL  05/10/2018   dr Adela Lank   ESOPHAGOGASTRODUODENOSCOPY N/A 09/23/2015   Procedure: ESOPHAGOGASTRODUODENOSCOPY (EGD);  Surgeon: Meryl Dare, MD;  Location: [facility];  Service: Endoscopy;  Laterality: N/A;   ESOPHAGOGASTRODUODENOSCOPY (EGD) WITH PROPOFOL N/A 09/16/2020   Procedure: ESOPHAGOGASTRODUODENOSCOPY (EGD) WITH PROPOFOL;  Surgeon: Tressia Danas, MD;  Location: [facility];  Service: Gastroenterology;  Laterality: N/A;   EXCISION MASS NECK Left 03/09/2023   Procedure: EXCISION SOFT TISSUE MASS LEFT POSTERIOR NECK;  Surgeon: Darnell Level, MD;  Location: [facility];  Service: General;  Laterality: Left;   FOOT SURGERY Left 1999   INGUINAL HERNIA REPAIR Left 05/15/2018   Procedure: LEFT INGUINAL HERNIA REPAIR WITH MESH;  Surgeon: Harriette Bouillon, MD;  Location: [facility];  Service: General;  Laterality: Left;   INSERTION OF MESH Left 05/15/2018   Procedure: INSERTION OF MESH;  Surgeon: Harriette Bouillon, MD;  Location: [facility];  Service: General;  Laterality: Left;   INTRAOPERATIVE CHOLANGIOGRAM N/A 03/20/2022   Procedure: INTRAOPERATIVE CHOLANGIOGRAM;  Surgeon: Darnell Level, MD;  Location: [facility];  Service: General;  Laterality: N/A;   REVERSE SHOULDER ARTHROPLASTY Left 08/30/2022   Procedure: LEFT REVERSE SHOULDER ARTHROPLASTY;  Surgeon: Cammy Copa, MD;  Location: [facility];  Service: Orthopedics;  Laterality: Left;   SHOULDER ARTHROSCOPY W/ ROTATOR CUFF REPAIR Left    yrs ago    Social History   Socioeconomic History   Marital status: Significant Other     Spouse name: Zoraida   Number of children: 6   Years of education: Not on file   Highest education level: 12th grade  Occupational History   Occupation: Employed    Employer: [employer]  Tobacco Use   Smoking status: Every Day    Current packs/day: 0.15    Average packs/day: 0.2 packs/day for 54.1 years (8.1 ttl pk-yrs)    Types: Cigarettes    Start date: 1971   Smokeless tobacco: Never   Tobacco comments:    02-27-2023  per pt daughter pt is still smoking,  but hides how much per day,  smoking since age 19 (12)  Vaping Use   Vaping status: Never Used  Substance and Sexual Activity   Alcohol use: Yes    Alcohol/week: 2.0 standard drinks of alcohol    Types: 2 Cans of beer per week    Comment: 2 beers per week   Drug use: Never   Sexual activity: Yes  Other Topics Concern   Not on file  Social History Narrative   Lives with wife.  Employed full time as a Designer, fashion/clothing.  6 children.   Social Drivers of Health   Financial Resource Strain: Medium Risk (08/02/2023)   Overall Financial Resource Strain (CARDIA)    Difficulty of Paying Living Expenses: Somewhat hard  Food Insecurity: Unknown (04/15/2023)   Hunger Vital Sign    Worried About Running Out of Food in the Last Year: Never true    Ran Out of Food in the Last Year: Patient declined  Transportation Needs: No Transportation Needs (08/02/2023)   PRAPARE - Administrator, Civil Service (Medical): No    Lack of Transportation (Non-Medical): No  Physical Activity: Insufficiently Active (08/02/2023)   Exercise Vital Sign    Days of Exercise per Week: 1 day    Minutes of Exercise per Session: 10 min  Stress: No Stress Concern Present (08/02/2023)   [employer] of Occupational Health - Occupational Stress Questionnaire    Feeling of Stress : Not at all  Social Connections: Socially Integrated (08/02/2023)   Social Connection and Isolation Panel [NHANES]    Frequency of Communication with Friends and Family:  More than three times a week    Frequency of Social Gatherings with Friends and Family: Three times a week    Attends Religious Services: 1 to 4 times per year    Active Member of Clubs or Organizations: No    Attends Engineer, structural: More than 4 times per year    Marital Status: Married  Catering manager Violence: Not At Risk (12/28/2022)   Humiliation, Afraid, Rape, and Kick questionnaire    Fear of Current or Ex-Partner: No    Emotionally Abused: No    Physically Abused: No    Sexually Abused: No    Family History  Problem Relation Age of Onset   Emphysema Mother    Diabetes Mother    Emphysema Father    Cancer Sister        Stomach cancer   Diabetes Sister    Stomach cancer Sister    Diabetes Brother    Colon cancer Neg Hx    Colon polyps Neg Hx    Esophageal cancer Neg Hx    Rectal cancer Neg Hx      Review of Systems  Constitutional: Negative.  Negative for chills and fever.  HENT: Negative.  Negative for congestion and sore throat.   Respiratory: Negative.  Negative for cough and shortness of breath.   Cardiovascular: Negative.  Negative for chest pain and palpitations.  Gastrointestinal:  Negative for abdominal pain, constipation, diarrhea, nausea and vomiting.  Genitourinary: Negative.  Negative for dysuria and hematuria.  Musculoskeletal:  Positive for joint pain (Right shoulder).  Skin: Negative.  Negative for rash.  Neurological: Negative.  Negative for dizziness and headaches.  All other systems reviewed and are negative.   Vitals:   08/02/23 1029  BP: 118/68  Pulse: (!) 103  Temp: 98.1 F (36.7 C)  SpO2: 93%    Physical Exam Vitals reviewed.  Constitutional:      Appearance: Normal appearance.  HENT:     Head: Normocephalic.  Eyes:     Extraocular Movements: Extraocular movements intact.  Cardiovascular:     Rate and Rhythm: Normal rate and regular rhythm.     Pulses: Normal pulses.     Heart sounds: Normal heart sounds.   Pulmonary:     Effort: Pulmonary effort is normal.     Breath sounds: Normal breath sounds.  Musculoskeletal:     Cervical back: No tenderness.     Comments: Right shoulder: Very limited range of motion  Lymphadenopathy:     Cervical: No cervical adenopathy.  Skin:    General: Skin is warm and dry.     Capillary Refill: Capillary refill takes less than 2 seconds.  Neurological:     Mental Status: He is alert and oriented to person, place, and time.  Psychiatric:        Mood and Affect: Mood normal.        Behavior: Behavior normal.    Results for orders placed or performed in visit on 08/02/23 (from the past 24 hours)  POCT HgB A1C     Status: Abnormal   Collection Time: 08/02/23 10:55 AM  Result Value Ref Range   Hemoglobin A1C 6.7 (A) 4.0 - 5.6 %   HbA1c POC (<> result, manual entry)     HbA1c, POC (prediabetic range)     HbA1c, POC (controlled diabetic range)        ASSESSMENT & PLAN: A total of 46 minutes was spent with the patient and counseling/coordination of care regarding preparing for this visit, review of most recent office visit notes, review of multiple chronic medical conditions and their management, cardiovascular risks associated with diabetes and hypertension, review of all medications, review of most recent bloodwork results including interpretation of today's hemoglobin A1c, review of health maintenance items, education on nutrition, prognosis, documentation, and need for follow up.  Problem List Items Addressed This Visit       Cardiovascular and Mediastinum   CAD (coronary artery disease)   Stable without recent anginal episodes Continue daily baby aspirin and atorvastatin 20 mg daily      Hypertension associated with diabetes (HCC) - Primary   Well-controlled hypertension.  Continue amlodipine 5 mg daily Well-controlled diabetes with hemoglobin A1c of 6.7 Diet and nutrition discussed Cardiovascular risks associated with hypertension and diabetes  discussed Continue weekly Trulicity 1.5 mg, daily Farxiga 10 mg, glipizide 10 mg before breakfast, Premeal insulin aspart as per sliding scale, and daily Tresiba 25 units      Relevant Medications   insulin aspart (NOVOLOG FLEXPEN) 100 UNIT/ML FlexPen     Respiratory   COPD (chronic obstructive pulmonary disease) (HCC)   Stable.  Continues to smoke.  Cardiovascular/cancer risks associated with smoking discussed.  Smoking cessation advice given Recommend starting NicoDerm patches Continues Breztri 2 inhalations twice a day and albuterol inhaler as needed      Relevant Medications   nicotine (NICODERM CQ) 14 mg/24hr patch     Endocrine   Dyslipidemia associated with type 2 diabetes mellitus (HCC)   Chronic stable conditions Continue atorvastatin 20 mg daily Diet and nutrition discussed      Relevant Medications   insulin aspart (NOVOLOG FLEXPEN) 100 UNIT/ML FlexPen   Uncontrolled type 2 diabetes mellitus with hyperglycemia (HCC)   Relevant Medications   insulin aspart (NOVOLOG FLEXPEN) 100 UNIT/ML FlexPen   Other Relevant Orders   POCT HgB A1C (Completed)  Patient Instructions  Diabetes mellitus y nutricin, en adultos Diabetes Mellitus and Nutrition, Adult Si sufre de diabetes, o diabetes mellitus, es muy importante tener hbitos alimenticios saludables debido a que sus niveles de Psychologist, counselling sangre (glucosa) se ven afectados en gran medida por lo que come y bebe. [place] alimentos saludables en las cantidades correctas, aproximadamente a la misma hora todos los [place], [place] ayudar a: Chief Operating Officer su glucemia. Disminuir el riesgo de sufrir una enfermedad cardaca. Mejorar la presin arterial. Barista o mantener un peso saludable. Qu puede afectar mi plan de alimentacin? Todas las personas que sufren de diabetes son diferentes y cada una tiene necesidades diferentes en cuanto a un plan de alimentacin. El mdico puede recomendarle que trabaje con un nutricionista para elaborar el  mejor plan para usted. Su plan de alimentacin puede variar segn factores como: Las caloras que necesita. Los medicamentos que toma. Su peso. Sus niveles de glucemia, presin arterial y colesterol. Su nivel de [place]. Otras afecciones que tenga, como enfermedades cardacas o renales. Cmo me afectan los carbohidratos? Los carbohidratos, o hidratos de carbono, afectan su nivel de glucemia ms que cualquier otro tipo de alimento. La ingesta de carbohidratos aumenta la cantidad de [employer]. Es importante conocer la cantidad de carbohidratos que se pueden ingerir en cada comida sin correr Surveyor, minerals. Esto es Government social research officer. Su nutricionista puede ayudarlo a calcular la cantidad de carbohidratos que debe ingerir en cada comida y en cada refrigerio. Cmo me afecta el alcohol? El alcohol puede provocar una disminucin de la glucemia (hipoglucemia), especialmente si [place] insulina o toma determinados medicamentos por va oral para la diabetes. La hipoglucemia es una afeccin potencialmente mortal. Los sntomas de la hipoglucemia, como somnolencia, mareos y confusin, son similares a los sntomas de haber consumido demasiado alcohol. No beba alcohol si: Su mdico le indica no hacerlo. Est embarazada, puede estar embarazada o est tratando de [place]. Si bebe alcohol: Limite la cantidad que bebe a lo siguiente: De 0 a 1 medida por da para las mujeres. De 0 a 2 medidas por da para los hombres. Sepa cunta cantidad de alcohol hay en las bebidas que toma. En los [address], una medida equivale a una botella de cerveza de 12 oz (355 ml), un vaso de vino de 5 oz (148 ml) o un vaso de una bebida alcohlica de alta graduacin de 1 oz (44 ml). Mantngase hidratado bebiendo agua, refrescos dietticos o t helado sin azcar. Tenga en cuenta que los refrescos comunes, los jugos y otras bebidas para mezclar pueden contener Product/process development scientist y se deben contar como  carbohidratos. Consejos para seguir Social worker las etiquetas de los alimentos Comience por leer el tamao de la porcin en la etiqueta de Informacin nutricional de los alimentos envasados y las bebidas. La cantidad de caloras, carbohidratos, grasas y otros nutrientes detallados en la etiqueta se basan en una porcin del alimento. Muchos alimentos contienen ms de una porcin por envase. Verifique la cantidad total de gramos (g) de carbohidratos totales en una porcin. Verifique la cantidad de gramos de grasas saturadas y grasas trans en una porcin. Escoja alimentos que no contengan estas grasas o que su contenido de estas sea [place]. Verifique la cantidad de miligramos (mg) de sal (sodio) en una porcin. La [employer] de las personas deben limitar la ingesta de sodio total a menos de 2300 mg Google. Siempre consulte la informacin nutricional de los alimentos etiquetados como "con bajo contenido de  grasa" o "sin grasa". Estos alimentos pueden tener un mayor contenido de International aid/development worker agregada o carbohidratos refinados, y deben evitarse. Hable con su nutricionista para identificar sus objetivos diarios en cuanto a los nutrientes mencionados en la etiqueta. Al ir de compras Evite comprar alimentos procesados, enlatados o precocidos. Estos alimentos tienden a Counselling psychologist mayor cantidad de [place], sodio y azcar agregada. Compre en la zona exterior de la tienda de comestibles. Esta es la zona donde se encuentran con mayor frecuencia las frutas y las verduras frescas, los cereales a granel, las carnes frescas y los productos lcteos frescos. Al cocinar Use mtodos de coccin a baja temperatura, como hornear, en lugar de mtodos de coccin a alta temperatura, como frer en abundante aceite. Cocine con aceites saludables, como el aceite de [place], canola o [place]. Evite cocinar con manteca, crema o carnes con alto contenido de grasa. Planificacin de las comidas Coma las comidas y los refrigerios regularmente,  preferentemente a la misma hora todos [place]. Evite pasar largos perodos de tiempo sin comer. Consuma alimentos ricos en fibra, como frutas frescas, verduras, frijoles y cereales integrales. Consuma entre 4 y 6 onzas (entre 112 y 168 g) de protenas magras por da, como carnes [place], pollo, pescado, huevos o tofu. Una onza (oz) (28 g) de protena magra equivale a: 1 onza (28 g) de carne, pollo o pescado. 1 huevo.  taza (62 g) de tofu. Coma algunos alimentos por da que contengan grasas saludables, como aguacates, frutos secos, semillas y pescado. Qu alimentos debo comer? Nils Pyle Bayas. Manzanas. Naranjas. Duraznos. Damascos. Ciruelas. Uvas. Mangos. Papayas. Granadas. Kiwi. Cerezas. Verduras Verduras de [employer], que incluyen [place], [place], col rizada, acelga, hojas de berza, hojas de mostaza y repollo. Remolachas. Coliflor. Brcoli. Zanahorias. Judas verdes. Tomates. Pimientos. Cebollas. Pepinos. Coles de Bruselas. Granos Granos integrales, como panes, galletas, tortillas, cereales y pastas de salvado o integrales. Avena sin azcar. Quinua. Arroz integral o salvaje. Carnes y otras protenas Frutos de mar. Carne de ave sin piel. Cortes magros de ave y carne de res. Tofu. Frutos secos. Semillas. Lcteos Productos lcteos sin grasa o con bajo contenido de [place], [place], yogur y [place]. Es posible que los productos detallados arriba no constituyan una lista completa de los alimentos y las bebidas que puede tomar. Consulte a un nutricionista para obtener ms informacin. Qu alimentos debo evitar? Nils Pyle Frutas enlatadas al almbar. Verduras Verduras enlatadas. Verduras congeladas con mantequilla o salsa de crema. Granos Productos elaborados con [place] y [place], como panes, pastas, bocadillos y cereales. Evite todos los alimentos procesados. Carnes y [address] de carne con alto contenido de Holiday representative. Carne de ave con piel. Carnes empanizadas o fritas.  Carne procesada. Evite las grasas saturadas. Lcteos Yogur, queso o [employer]. Bebidas Bebidas azucaradas, como gaseosas o t helado. Es posible que los productos que se enumeran ms [place] no constituyan una lista completa de los alimentos y las bebidas que Personnel officer. Consulte a un nutricionista para obtener ms informacin. Preguntas para hacerle al mdico Debo consultar con un especialista certificado en atencin y educacin sobre la diabetes? Es necesario que me rena con un nutricionista? A qu nmero puedo llamar si tengo preguntas? Cules son los mejores momentos para controlar la glucemia? Dnde encontrar ms informacin: American Diabetes Association (Asociacin Estadounidense de la Diabetes): diabetes.org Academy of Nutrition and Dietetics (Academia de Nutricin y Pension scheme manager): eatright.[employer] of Diabetes and Digestive and Kidney Diseases [employer] de la Diabetes y las Enfermedades Digestivas y Renales):  StageSync.si Association of Diabetes Care & Education Specialists (Asociacin de Especialistas en Atencin y Educacin sobre la Diabetes): diabeteseducator.org Resumen Es importante tener hbitos alimenticios saludables debido a que sus niveles de Psychologist, counselling sangre (glucosa) se ven afectados en gran medida por lo que come y bebe. Es importante consumir alcohol con prudencia. Un plan de comidas saludable lo ayudar a controlar la glucosa en sangre y a reducir el riesgo de enfermedades cardacas. El mdico puede recomendarle que trabaje con un nutricionista para elaborar el mejor plan para usted. Esta informacin no tiene Theme park manager el consejo del mdico. Asegrese de hacerle al mdico cualquier pregunta que tenga. Document Revised: 02/26/2020 Document Reviewed: 02/26/2020 Elsevier Patient Education  2024 Elsevier Inc.     Edwina Barth, MD [facility]

## 2023-08-02 NOTE — Progress Notes (Signed)
Surgical Instructions   Your procedure is scheduled on Tuesday August 08, 2023. Report to [facility] Main Entrance "A" at 9:20 A.M., then check in with the Admitting office. Any questions or running late day of surgery: call [phone]  Questions prior to your surgery date: call [phone], Monday-Friday, 8am-4pm. If you experience any cold or flu symptoms such as cough, fever, chills, shortness of breath, etc. between now and your scheduled surgery, please notify us at the above number.     Remember:  Do not eat after midnight the night before your surgery  You may drink clear liquids until 8:20 the morning of your surgery.   Clear liquids allowed are: Water, Non-Citrus Juices (without pulp), Carbonated Beverages, Clear Tea (no milk, honey, etc.), Black Coffee Only (NO MILK, CREAM OR POWDERED CREAMER of any kind), and Gatorade.  Patient Instructions  The night before surgery:  No food after midnight. ONLY clear liquids after midnight  The day of surgery (if you have diabetes): Drink ONE (1) 12 oz G2 given to you in your pre admission testing appointment by 8:20 the morning of surgery. Drink in one sitting. Do not sip.  This drink was given to you during your hospital  pre-op appointment visit.  Nothing else to drink after completing the  12 oz bottle of G2.         If you have questions, please contact your surgeon's office.    Take these medicines the morning of surgery with A SIP OF WATER  amLODipine (NORVASC)  atorvastatin (LIPITOR)  Budeson-Glycopyrrol-Formoterol (BREZTRI AEROSPHERE)  methocarbamol (ROBAXIN)  pantoprazole (PROTONIX)  Ubrogepant (UBRELVY)   May take these medicines IF NEEDED: albuterol (PROVENTIL) (2.5 MG/3ML) 0.083% nebulizer solution  albuterol (VENTOLIN HFA) 108 (90 Base) MCG/ACT inhaler. Please bring with you to the hospital.  HYDROcodone-acetaminophen (NORCO/VICODIN)   Follow your surgeon's instructions on when to stop Asprin.  If no  instructions were given by your surgeon then you will need to call the office to get those instructions.    One week prior to surgery, STOP taking any Aleve, Naproxen, Ibuprofen, Motrin, Advil, Goody's, BC's, all herbal medications, fish oil, and non-prescription vitamins.   WHAT DO I DO ABOUT MY DIABETES MEDICATION?   Do not take oral diabetes medicines (pills) the morning of surgery.        DO NOT TAKE YOUR FARXIGA 72 HOURS PRIOR TO SURGERY, WITH THE LAST DOSE BEING 08/04/2023.         DO NOT TAKE YOUR Dulaglutide (TRULICITY) 7 DAYS PRIOR TO SURGERY, WITH THE LAST DOSE BEING NO LATER THAN 07/31/2023.         DO NOT TAKE YOUR  glipiZIDE (GLUCOTROL) THE MORNING OF SURGERY.               THE MORNING OF SURGERY, TAKE 12.5 UNITS OF TRESIBA FLEXTOUCH.  tHIS 50% OF YOUR REGULAR DOSE.       THE MORNING OF SURGERY, take ZERO UNITS OF insulin aspart (NOVOLOG FLEXPEN).  If your CBG is greater than 220 mg/dL, you may take  of your sliding scale (correction) dose of insulin.  The day of surgery, do not take other diabetes injectables, including Byetta (exenatide), Bydureon (exenatide ER), Victoza (liraglutide), or Trulicity (dulaglutide).    HOW TO MANAGE YOUR DIABETES BEFORE AND AFTER SURGERY  Why is it important to control my blood sugar before and after surgery? Improving blood sugar levels before and after surgery helps healing and can limit problems. A way of improving blood  sugar control is eating a healthy diet by:  Eating less sugar and carbohydrates  Increasing activity/exercise  Talking with your doctor about reaching your blood sugar goals High blood sugars (greater than 180 mg/dL) can raise your risk of infections and slow your recovery, so you will need to focus on controlling your diabetes during the weeks before surgery. Make sure that the doctor who takes care of your diabetes knows about your planned surgery including the date and location.  How do I manage my blood sugar  before surgery? Check your blood sugar at least 4 times a day, starting 2 days before surgery, to make sure that the level is not too high or low.  Check your blood sugar the morning of your surgery when you wake up and every 2 hours until you get to the Short Stay unit.  If your blood sugar is less than 70 mg/dL, you will need to treat for low blood sugar: Do not take insulin. Treat a low blood sugar (less than 70 mg/dL) with  cup of clear juice (cranberry or apple), 4 glucose tablets, OR glucose gel. Recheck blood sugar in 15 minutes after treatment (to make sure it is greater than 70 mg/dL). If your blood sugar is not greater than 70 mg/dL on recheck, call [phone] for further instructions. Report your blood sugar to the short stay nurse when you get to Short Stay.  If you are admitted to the hospital after surgery: Your blood sugar will be checked by the staff and you will probably be given insulin after surgery (instead of oral diabetes medicines) to make sure you have good blood sugar levels. The goal for blood sugar control after surgery is 80-180 mg/dL.                      Do NOT Smoke (Tobacco/Vaping) for 24 hours prior to your procedure.  If you use a CPAP at night, you may bring your mask/headgear for your overnight stay.   You will be asked to remove any contacts, glasses, piercing's, hearing aid's, dentures/partials prior to surgery. Please bring cases for these items if needed.    Patients discharged the day of surgery will not be allowed to drive home, and someone needs to stay with them for 24 hours.  SURGICAL WAITING ROOM VISITATION Patients may have no more than 2 support people in the waiting area - these visitors may rotate.   Pre-op nurse will coordinate an appropriate time for 1 ADULT support person, who may not rotate, to accompany patient in pre-op.  Children under the age of 44 must have an adult with them who is not the patient and must remain in the main  waiting area with an adult.  If the patient needs to stay at the hospital during part of their recovery, the visitor guidelines for inpatient rooms apply.  Please refer to the [facility] website for the visitor guidelines for any additional information.   If you received a COVID test during your pre-op visit  it is requested that you wear a mask when out in public, stay away from anyone that may not be feeling well and notify your surgeon if you develop symptoms. If you have been in contact with anyone that has tested positive in the last 10 days please notify you surgeon.  [facility]- Preparing for Total Shoulder Arthroplasty  Before surgery, you can play an important role. Because skin is not sterile, your skin needs to be as free of germs as possible. You can reduce the number of germs on your skin by using the following products.   Benzoyl Peroxide Gel  o Reduces the number of germs present on the skin  o Applied twice a day to shoulder area starting two days before surgery   Chlorhexidine Gluconate (CHG) Soap (instructions listed above on how to wash with CHG Soap)  o An antiseptic cleaner that kills germs and bonds with the skin to continue killing germs even after washing  o Used for showering the night before surgery and morning of surgery   ==================================================================  Please follow these instructions carefully:  BENZOYL PEROXIDE 5% GEL  Please do not use if you have an allergy to benzoyl peroxide. If your skin becomes reddened/irritated stop using the benzoyl peroxide.  Starting two days before surgery, apply as follows:  1. Apply benzoyl peroxide in the morning and at night. Apply after taking a shower. If you are not taking a shower clean entire shoulder front,  back, and side along with the armpit with a clean wet washcloth.  2. Place a quarter-sized dollop on your SHOULDER and rub in thoroughly, making sure to cover the front, back, and side of your shoulder, along with the armpit.   2 Days prior to Surgery First Dose on _____________ Morning Second Dose on ______________ Night  Day Before Surgery First Dose on ______________ Morning Night before surgery wash (entire body except face and private areas) with CHG Soap THEN Second Dose on ____________ Night   Morning of Surgery  wash BODY AGAIN with CHG Soap   4. Do NOT apply benzoyl peroxide gel on the day of surgery    Pre-operative 5 CHG Bathing Instructions   You can play a key role in reducing the risk of infection after surgery. Your skin needs to be as free of germs as possible. You can reduce the number of germs on your skin by washing with CHG (chlorhexidine gluconate) soap before surgery. CHG is an antiseptic soap that kills germs and continues to kill germs even after washing.   DO NOT use if you have an allergy to chlorhexidine/CHG or antibacterial soaps. If your skin becomes reddened or irritated, stop using the CHG and notify one of our RNs at [phone].   Please shower with the CHG soap starting 4 days before surgery using the following schedule:     Please keep in mind the following:  DO NOT shave, including legs and underarms, starting the day of your first shower.   You may shave your face at any point before/day of surgery.  Place clean sheets on your bed the day you start using CHG soap. Use a clean washcloth (not used since being washed) for each shower. DO NOT sleep with pets once you start using the CHG.   CHG Shower Instructions:  Wash your face and private area with normal soap. If you choose to wash your hair, wash first with your normal shampoo.  After you use shampoo/soap, rinse your hair and body thoroughly to remove shampoo/soap residue.  Turn the  water OFF and apply about 3 tablespoons (45 ml) of CHG soap to a CLEAN washcloth.  Apply CHG soap ONLY FROM YOUR NECK DOWN TO YOUR TOES (washing for 3-5 minutes)  DO NOT use CHG soap on face, private areas, open wounds,  or sores.  Pay special attention to the area where your surgery is being performed.  If you are having back surgery, having someone wash your back for you may be helpful. Wait 2 minutes after CHG soap is applied, then you may rinse off the CHG soap.  Pat dry with a clean towel  Put on clean clothes/pajamas   If you choose to wear lotion, please use ONLY the CHG-compatible lotions that are listed below.  Additional instructions for the day of surgery: DO NOT APPLY any lotions, deodorants or cologne.    Do not bring valuables to the hospital. [facility] is not responsible for any belongings/valuables. Do not wear jewelry Put on clean/comfortable clothes.  Please brush your teeth.  Ask your nurse before applying any prescription medications to the skin.     CHG Compatible Lotions   Aveeno Moisturizing lotion  Cetaphil Moisturizing Cream  Cetaphil Moisturizing Lotion  Clairol Herbal Essence Moisturizing Lotion, Dry Skin  Clairol Herbal Essence Moisturizing Lotion, Extra Dry Skin  Clairol Herbal Essence Moisturizing Lotion, Normal Skin  Curel Age Defying Therapeutic Moisturizing Lotion with Alpha Hydroxy  Curel Extreme Care Body Lotion  Curel Soothing Hands Moisturizing Hand Lotion  Curel Therapeutic Moisturizing Cream, Fragrance-Free  Curel Therapeutic Moisturizing Lotion, Fragrance-Free  Curel Therapeutic Moisturizing Lotion, Original Formula  Eucerin Daily Replenishing Lotion  Eucerin Dry Skin Therapy Plus Alpha Hydroxy Crme  Eucerin Dry Skin Therapy Plus Alpha Hydroxy Lotion  Eucerin Original Crme  Eucerin Original Lotion  Eucerin Plus Crme Eucerin Plus Lotion  Eucerin TriLipid Replenishing Lotion  Keri Anti-Bacterial Hand Lotion  Keri Deep Conditioning  Original Lotion Dry Skin Formula Softly Scented  Keri Deep Conditioning Original Lotion, Fragrance Free Sensitive Skin Formula  Keri Lotion Fast Absorbing Fragrance Free Sensitive Skin Formula  Keri Lotion Fast Absorbing Softly Scented Dry Skin Formula  Keri Original Lotion  Keri Skin Renewal Lotion Keri Silky Smooth Lotion  Keri Silky Smooth Sensitive Skin Lotion  Nivea Body Creamy Conditioning Oil  Nivea Body Extra Enriched Lotion  Nivea Body Original Lotion  Nivea Body Sheer Moisturizing Lotion Nivea Crme  Nivea Skin Firming Lotion  NutraDerm 30 Skin Lotion  NutraDerm Skin Lotion  NutraDerm Therapeutic Skin Cream  NutraDerm Therapeutic Skin Lotion  ProShield Protective Hand Cream  Provon moisturizing lotion  Please read over the following fact sheets that you were given.

## 2023-08-02 NOTE — Assessment & Plan Note (Signed)
Stable.  Continues to smoke.  Cardiovascular/cancer risks associated with smoking discussed.  Smoking cessation advice given Recommend starting NicoDerm patches Continues Breztri 2 inhalations twice a day and albuterol inhaler as needed

## 2023-08-02 NOTE — Telephone Encounter (Signed)
Copied from CRM [id number]. Topic: Clinical - Medication Refill >> Aug 02, 2023 10:33 AM Aletta Edouard wrote: Most Recent Primary Care Visit:  Provider: Georgina Quint  Department: [facility]  Visit Type: OFFICE VISIT  Date: 04/17/2023  Medication: insulin aspart (NOVOLOG FLEXPEN) 100 UNIT/ML FlexPen   INHALER AND JULIE FROM THE PHARMACY ALSO WANTS TO KNOW IF THE PATIENT IS STILL TAKING Ubrogepant (UBRELVY) 100 MG TABS Has the patient contacted their pharmacy? No (Agent: If no, request that the patient contact the pharmacy for the refill. If patient does not wish to contact the pharmacy document the reason why and proceed with request.) (Agent: If yes, when and what did the pharmacy advise?)  Is this the correct pharmacy for this prescription? Yes If no, delete pharmacy and type the correct one.  This is the patient's preferred pharmacy:  [facility] [address] [place], [place] - [address] [place]. AT [address] [place]. [place] [address] Phone: [phone] Fax: [phone]  [facility] - [address] [place], [place] - [address] [place] [address] Phone: [phone] Fax: [phone]   Has the prescription been filled recently? No  Is the patient out of the medication? Yes  Has the patient been seen for an appointment in the last year OR does the patient have an upcoming appointment? Yes  Can we respond through MyChart? No  Agent: Please be advised that Rx refills may take up to 3 business days. We ask that you follow-up with your pharmacy.

## 2023-08-02 NOTE — Patient Instructions (Signed)
Diabetes mellitus y nutricin, en adultos Diabetes Mellitus and Nutrition, Adult Si sufre de diabetes, o diabetes mellitus, es muy importante tener hbitos alimenticios saludables debido a que sus niveles de Psychologist, counselling sangre (glucosa) se ven afectados en gran medida por lo que come y bebe. Comer alimentos saludables en las cantidades correctas, aproximadamente a la misma hora todos los [place], [place] ayudar a: Chief Operating Officer su glucemia. Disminuir el riesgo de sufrir una enfermedad cardaca. Mejorar la presin arterial. Barista o mantener un peso saludable. Qu puede afectar mi plan de alimentacin? Todas las personas que sufren de diabetes son diferentes y cada una tiene necesidades diferentes en cuanto a un plan de alimentacin. El mdico puede recomendarle que trabaje con un nutricionista para elaborar el mejor plan para usted. Su plan de alimentacin puede variar segn factores como: Las caloras que necesita. Los medicamentos que toma. Su peso. Sus niveles de glucemia, presin arterial y colesterol. Su nivel de [place]. Otras afecciones que tenga, como enfermedades cardacas o renales. Cmo me afectan los carbohidratos? Los carbohidratos, o hidratos de carbono, afectan su nivel de glucemia ms que cualquier otro tipo de alimento. La ingesta de carbohidratos aumenta la cantidad de [employer]. Es importante conocer la cantidad de carbohidratos que se pueden ingerir en cada comida sin correr Surveyor, minerals. Esto es Government social research officer. Su nutricionista puede ayudarlo a calcular la cantidad de carbohidratos que debe ingerir en cada comida y en cada refrigerio. Cmo me afecta el alcohol? El alcohol puede provocar una disminucin de la glucemia (hipoglucemia), especialmente si [place] insulina o toma determinados medicamentos por va oral para la diabetes. La hipoglucemia es una afeccin potencialmente mortal. Los sntomas de la hipoglucemia, como somnolencia, mareos y confusin, son  similares a los sntomas de haber consumido demasiado alcohol. No beba alcohol si: Su mdico le indica no hacerlo. Est embarazada, puede estar embarazada o est tratando de [place]. Si bebe alcohol: Limite la cantidad que bebe a lo siguiente: De 0 a 1 medida por da para las mujeres. De 0 a 2 medidas por da para los hombres. Sepa cunta cantidad de alcohol hay en las bebidas que toma. En los [address], una medida equivale a una botella de cerveza de 12 oz (355 ml), un vaso de vino de 5 oz (148 ml) o un vaso de una bebida alcohlica de alta graduacin de 1 oz (44 ml). Mantngase hidratado bebiendo agua, refrescos dietticos o t helado sin azcar. Tenga en cuenta que los refrescos comunes, los jugos y otras bebidas para mezclar pueden contener Product/process development scientist y se deben contar como carbohidratos. Consejos para seguir Social worker las etiquetas de los alimentos Comience por leer el tamao de la porcin en la etiqueta de Informacin nutricional de los alimentos envasados y las bebidas. La cantidad de caloras, carbohidratos, grasas y otros nutrientes detallados en la etiqueta se basan en una porcin del alimento. Muchos alimentos contienen ms de una porcin por envase. Verifique la cantidad total de gramos (g) de carbohidratos totales en una porcin. Verifique la cantidad de gramos de grasas saturadas y grasas trans en una porcin. Escoja alimentos que no contengan estas grasas o que su contenido de estas sea [place]. Verifique la cantidad de miligramos (mg) de sal (sodio) en una porcin. La [employer] de las personas deben limitar la ingesta de sodio total a menos de 2300 mg Google. Siempre consulte la informacin nutricional de los alimentos etiquetados como "con bajo contenido de grasa" o "sin grasa".  Estos alimentos pueden tener un mayor contenido de International aid/development worker agregada o carbohidratos refinados, y deben evitarse. Hable con su nutricionista para identificar sus objetivos diarios en  cuanto a los nutrientes mencionados en la etiqueta. Al ir de compras Evite comprar alimentos procesados, enlatados o precocidos. Estos alimentos tienden a Counselling psychologist mayor cantidad de [place], sodio y azcar agregada. Compre en la zona exterior de la tienda de comestibles. Esta es la zona donde se encuentran con mayor frecuencia las frutas y las verduras frescas, los cereales a granel, las carnes frescas y los productos lcteos frescos. Al cocinar Use mtodos de coccin a baja temperatura, como hornear, en lugar de mtodos de coccin a alta temperatura, como frer en abundante aceite. Cocine con aceites saludables, como el aceite de [place], canola o [place]. Evite cocinar con manteca, crema o carnes con alto contenido de grasa. Planificacin de las comidas Coma las comidas y los refrigerios regularmente, preferentemente a la misma hora todos [place]. Evite pasar largos perodos de tiempo sin comer. Consuma alimentos ricos en fibra, como frutas frescas, verduras, frijoles y cereales integrales. Consuma entre 4 y 6 onzas (entre 112 y 168 g) de protenas magras por da, como carnes [place], pollo, pescado, huevos o tofu. Una onza (oz) (28 g) de protena magra equivale a: 1 onza (28 g) de carne, pollo o pescado. 1 huevo.  taza (62 g) de tofu. Coma algunos alimentos por da que contengan grasas saludables, como aguacates, frutos secos, semillas y pescado. Qu alimentos debo comer? Nils Pyle Bayas. Manzanas. Naranjas. Duraznos. Damascos. Ciruelas. Uvas. Mangos. Papayas. Granadas. Kiwi. Cerezas. Verduras Verduras de [employer], que incluyen [place], [place], col rizada, acelga, hojas de berza, hojas de mostaza y repollo. Remolachas. Coliflor. Brcoli. Zanahorias. Judas verdes. Tomates. Pimientos. Cebollas. Pepinos. Coles de Bruselas. Granos Granos integrales, como panes, galletas, tortillas, cereales y pastas de salvado o integrales. Avena sin azcar. Quinua. Arroz integral o salvaje. Carnes y otras  protenas Frutos de mar. Carne de ave sin piel. Cortes magros de ave y carne de res. Tofu. Frutos secos. Semillas. Lcteos Productos lcteos sin grasa o con bajo contenido de [place], [place], yogur y [place]. Es posible que los productos detallados arriba no constituyan una lista completa de los alimentos y las bebidas que puede tomar. Consulte a un nutricionista para obtener ms informacin. Qu alimentos debo evitar? Nils Pyle Frutas enlatadas al almbar. Verduras Verduras enlatadas. Verduras congeladas con mantequilla o salsa de crema. Granos Productos elaborados con [place] y [place], como panes, pastas, bocadillos y cereales. Evite todos los alimentos procesados. Carnes y [address] de carne con alto contenido de Holiday representative. Carne de ave con piel. Carnes empanizadas o fritas. Carne procesada. Evite las grasas saturadas. Lcteos Yogur, queso o [employer] Health. Bebidas Bebidas azucaradas, como gaseosas o t helado. Es posible que los productos que se enumeran ms [place] no constituyan una lista completa de los alimentos y las bebidas que Personnel officer. Consulte a un nutricionista para obtener ms informacin. Preguntas para hacerle al mdico Debo consultar con un especialista certificado en atencin y educacin sobre la diabetes? Es necesario que me rena con un nutricionista? A qu nmero puedo llamar si tengo preguntas? Cules son los mejores momentos para controlar la glucemia? Dnde encontrar ms informacin: American Diabetes Association (Asociacin Estadounidense de la Diabetes): diabetes.org Academy of Nutrition and Dietetics (Academia de Nutricin y Pension scheme manager): eatright.Dana Corporation of Diabetes and Digestive and Kidney Diseases [employer] Company de la Diabetes y las Enfermedades Digestivas y Renales): StageSync.si Association of Diabetes  Care & Education Specialists (Asociacin de Especialistas en Atencin y Francella Solian la Diabetes):  diabeteseducator.org Resumen Es importante tener hbitos alimenticios saludables debido a que sus niveles de Psychologist, counselling sangre (glucosa) se ven afectados en gran medida por lo que come y bebe. Es importante consumir alcohol con prudencia. Un plan de comidas saludable lo ayudar a controlar la glucosa en sangre y a reducir el riesgo de enfermedades cardacas. El mdico puede recomendarle que trabaje con un nutricionista para elaborar el mejor plan para usted. Esta informacin no tiene Theme park manager el consejo del mdico. Asegrese de hacerle al mdico cualquier pregunta que tenga. Document Revised: 02/26/2020 Document Reviewed: 02/26/2020 Elsevier Patient Education  2024 [employer].

## 2023-08-02 NOTE — Assessment & Plan Note (Signed)
Stable without recent anginal episodes Continue daily baby aspirin and atorvastatin 20 mg daily

## 2023-08-03 ENCOUNTER — Encounter: Payer: Self-pay | Admitting: Orthopedic Surgery

## 2023-08-03 DIAGNOSIS — H179 Unspecified corneal scar and opacity: Secondary | ICD-10-CM | POA: Diagnosis not present

## 2023-08-03 DIAGNOSIS — H04123 Dry eye syndrome of bilateral lacrimal glands: Secondary | ICD-10-CM | POA: Diagnosis not present

## 2023-08-03 DIAGNOSIS — Z0100 Encounter for examination of eyes and vision without abnormal findings: Secondary | ICD-10-CM | POA: Diagnosis not present

## 2023-08-03 DIAGNOSIS — E119 Type 2 diabetes mellitus without complications: Secondary | ICD-10-CM | POA: Diagnosis not present

## 2023-08-03 DIAGNOSIS — H2513 Age-related nuclear cataract, bilateral: Secondary | ICD-10-CM | POA: Diagnosis not present

## 2023-08-03 LAB — HM DIABETES EYE EXAM

## 2023-08-04 ENCOUNTER — Other Ambulatory Visit: Payer: Self-pay

## 2023-08-04 ENCOUNTER — Encounter (HOSPITAL_COMMUNITY)
Admission: RE | Admit: 2023-08-04 | Discharge: 2023-08-04 | Disposition: A | Discharge status: Home / Self Care | Payer: Medicare HMO | Source: Ambulatory Visit | Attending: Orthopedic Surgery

## 2023-08-04 ENCOUNTER — Encounter (HOSPITAL_COMMUNITY): Payer: Self-pay

## 2023-08-04 VITALS — BP 125/80 | HR 94 | Temp 98.1°F | Resp 16 | Ht 68.0 in | Wt 164.0 lb

## 2023-08-04 DIAGNOSIS — Z01818 Encounter for other preprocedural examination: Secondary | ICD-10-CM | POA: Insufficient documentation

## 2023-08-04 DIAGNOSIS — J439 Emphysema, unspecified: Secondary | ICD-10-CM | POA: Diagnosis not present

## 2023-08-04 DIAGNOSIS — E785 Hyperlipidemia, unspecified: Secondary | ICD-10-CM | POA: Insufficient documentation

## 2023-08-04 DIAGNOSIS — I252 Old myocardial infarction: Secondary | ICD-10-CM | POA: Diagnosis not present

## 2023-08-04 DIAGNOSIS — I2510 Atherosclerotic heart disease of native coronary artery without angina pectoris: Secondary | ICD-10-CM | POA: Insufficient documentation

## 2023-08-04 DIAGNOSIS — I119 Hypertensive heart disease without heart failure: Secondary | ICD-10-CM | POA: Insufficient documentation

## 2023-08-04 DIAGNOSIS — E119 Type 2 diabetes mellitus without complications: Secondary | ICD-10-CM | POA: Insufficient documentation

## 2023-08-04 LAB — URINALYSIS, W/ REFLEX TO CULTURE (INFECTION SUSPECTED)
Bacteria, UA: NONE SEEN
Bilirubin Urine: NEGATIVE
Glucose, UA: >=500 mg/dL — AB
Ketones, ur: NEGATIVE mg/dL
Nitrite: NEGATIVE
Protein, ur: NEGATIVE mg/dL
Specific Gravity, Urine: 1.024 (ref 1.005–1.030)
pH: 5 (ref 5.0–8.0)

## 2023-08-04 LAB — CBC
HCT: 49.8 % (ref 39.0–52.0)
Hemoglobin: 16.2 g/dL (ref 13.0–17.0)
MCH: 30.2 pg (ref 26.0–34.0)
MCHC: 32.5 g/dL (ref 30.0–36.0)
MCV: 92.9 fL (ref 80.0–100.0)
Platelets: 405 10*3/uL — ABNORMAL HIGH (ref 150–400)
RBC: 5.36 MIL/uL (ref 4.22–5.81)
RDW: 13.4 % (ref 11.5–15.5)
WBC: 9 10*3/uL (ref 4.0–10.5)
nRBC: 0 % (ref 0.0–0.2)

## 2023-08-04 LAB — BASIC METABOLIC PANEL
Anion gap: 11 (ref 5–15)
BUN: 14 mg/dL (ref 8–23)
CO2: 27 mmol/L (ref 22–32)
Calcium: 9.2 mg/dL (ref 8.9–10.3)
Chloride: 102 mmol/L (ref 98–111)
Creatinine, Ser: 0.91 mg/dL (ref 0.61–1.24)
GFR, Estimated: >60 mL/min (ref >60)
Glucose, Bld: 117 mg/dL — ABNORMAL HIGH (ref 70–99)
Potassium: 3.9 mmol/L (ref 3.5–5.1)
Sodium: 140 mmol/L (ref 135–145)

## 2023-08-04 LAB — GLUCOSE, CAPILLARY: Glucose-Capillary: 170 mg/dL — ABNORMAL HIGH (ref 70–99)

## 2023-08-04 LAB — SURGICAL PCR SCREEN
MRSA, PCR: POSITIVE — AB
Staphylococcus aureus: POSITIVE — AB

## 2023-08-04 NOTE — Progress Notes (Signed)
PCP - Dr. Edwina Barth Cardiologist - denies Pulmonologist - denies Neurologist - Dr. Conchita Paris  PPM/ICD - denies Device Orders - n/a Rep Notified - n/a  Chest x-ray - denies EKG - 08/04/23 Stress Test - 11/02/19 ECHO - 04/28/20 Cardiac Cath - denise  Sleep Study - denies CPAP - n/a  Fasting Blood Sugar - 110-120 Checks Blood Sugar _3-4___ times a day  Last dose of GLP1 agonist-  Trulicity - 08/03/23 GLP1 instructions: patient instructed not to take Trulicity prior to procedure  Blood Thinner Instructions: n/a Aspirin Instructions:  Per [name], PA - patient's last dose of Aspirin should be 1/31 - patient made aware that today should be his last dose  ERAS Protcol - clears until 8:20 PRE-SURGERY Ensure or G2-  G2 as ordered  COVID TEST- no   Anesthesia review: yes  Patient denies shortness of breath, fever, cough and chest pain at PAT appointment   All instructions explained to the patient, with a verbal understanding of the material. Patient agrees to go over the instructions while at home for a better understanding. Patient also instructed to self quarantine after being tested for COVID-19. The opportunity to ask questions was provided.   Patient states that he oxygen at home if he needs it but he has not needed to use in 3-4 months.

## 2023-08-04 NOTE — Progress Notes (Signed)
[name], [place] notified of patient's positive MRSA result and urine result.  Per [name], vancomycin was added.  No new orders received for urine result.

## 2023-08-04 NOTE — Telephone Encounter (Signed)
Spoke with Pharmacy and they are unsure why a staff there was asking

## 2023-08-04 NOTE — Telephone Encounter (Signed)
 thx

## 2023-08-06 NOTE — Progress Notes (Signed)
Hi [name] can you make sure he gets Vanco and Ancef before surgery.  Thanks

## 2023-08-07 ENCOUNTER — Telehealth: Payer: Self-pay | Admitting: *Deleted

## 2023-08-07 NOTE — Progress Notes (Signed)
 thx

## 2023-08-07 NOTE — Anesthesia Preprocedure Evaluation (Addendum)
Anesthesia Evaluation  Patient identified by MRN, date of birth, ID band Patient awake    Reviewed: Allergy & Precautions, NPO status , Patient's Chart, lab work & pertinent test results  History of Anesthesia Complications Negative for: history of anesthetic complications  Airway Mallampati: I  TM Distance: >3 FB Neck ROM: Full    Dental  (+) Edentulous Upper, Edentulous Lower, Dental Advisory Given   Pulmonary COPD,  COPD inhaler and oxygen dependent, Current Smoker and Patient abstained from smoking.   breath sounds clear to auscultation       Cardiovascular hypertension, Pt. on medications (-) angina + CAD  (-) Past MI and (-) CHF  Rhythm:Regular  Coronary CT 09/15/20: MPRESSION: 1. Coronary calcium score of 0. This was 0 percentile for age and sex matched control. 2.  Normal coronary origin with right dominance. 3.  No evidence of CAD.  CAD RADS 0. 4.  Aortic atherosclerosis. 5.  Consider non atherosclerotic causes of chest pain.   Echo 04/28/20: IMPRESSIONS  1. Left ventricular ejection fraction, by estimation, is 60 to 65%. The  left ventricle has normal function. The left ventricle has no regional  wall motion abnormalities. There is mild concentric left ventricular  hypertrophy. Left ventricular diastolic  parameters are consistent with Grade I diastolic dysfunction (impaired  relaxation).  2. Right ventricular systolic function is normal. The right ventricular  size is normal. There is moderately elevated pulmonary artery systolic  pressure. The estimated right ventricular systolic pressure is 54.7 mmHg.  3. The mitral valve is normal in structure. No evidence of mitral valve  regurgitation. No evidence of mitral stenosis.  4. The aortic valve is normal in structure. Aortic valve regurgitation is  not visualized. No aortic stenosis is present.  5. The inferior vena cava is dilated in size with <50%  respiratory  variability, suggesting right atrial pressure of 15 mmHg.      Neuro/Psych  Headaches    GI/Hepatic Neg liver ROS,GERD  ,,  Endo/Other  diabetes, Type 2, Oral Hypoglycemic Agents, Insulin Dependent    Renal/GU negative Renal ROSLab Results      Component                Value               Date                      NA                       140                 08/04/2023                K                        3.9                 08/04/2023                CO2                      27                  08/04/2023                GLUCOSE  117 (H)             08/04/2023                BUN                      14                  08/04/2023                CREATININE               0.91                08/04/2023                CALCIUM                  9.2                 08/04/2023                GFR                      86.17               12/29/2022                GFRNONAA                 >60                 08/04/2023                Musculoskeletal   Abdominal   Peds  Hematology negative hematology ROS (+) Lab Results      Component                Value               Date                      WBC                      9.0                 08/04/2023                HGB                      16.2                08/04/2023                HCT                      49.8                08/04/2023                MCV                      92.9                08/04/2023                PLT                      405 (  H)             08/04/2023              Anesthesia Other Findings History includes smoking, COPD/emphysema (nocturnal O2), HTN, HLD, DM2 (diagnosed 10/2014), CAD (10/2019 stress test suggested old inferior MI; 09/2020 CCTA showed CAC 0 with no CAD), cerebral aneurysm, hepatis steatosis, GERD, exertional dyspnea, full dentures, hernia (left IHR 05/15/18), left reverse TSA (08/30/22), left neck lipoma excision (9/56/24).  Reproductive/Obstetrics                              Anesthesia Physical Anesthesia Plan  ASA: 3  Anesthesia Plan: General and Regional   Post-op Pain Management: Regional block* and Tylenol PO (pre-op)*   Induction: Intravenous  PONV Risk Score and Plan: 1 and Midazolam, Dexamethasone and Ondansetron  Airway Management Planned: Oral ETT  Additional Equipment:   Intra-op Plan:   Post-operative Plan: Extubation in OR  Informed Consent: I have reviewed the patients History and Physical, chart, labs and discussed the procedure including the risks, benefits and alternatives for the proposed anesthesia with the patient or authorized representative who has indicated his/her understanding and acceptance.     Dental advisory given  Plan Discussed with: CRNA  Anesthesia Plan Comments: (PAT note written 08/07/2023 by Shonna Chock, PA-C.  )        Anesthesia Quick Evaluation

## 2023-08-07 NOTE — Telephone Encounter (Signed)
 Ortho bundle pre-op call completed.

## 2023-08-07 NOTE — Progress Notes (Signed)
Yeah I put order in last week

## 2023-08-07 NOTE — Care Plan (Signed)
[facility] RNCM call to patient and spoke with daughter, who assists with interpretation when needed. Reviewed his upcoming Right reverse shoulder arthroplasty with Dr. August Saucer on 08/08/23 at [facility]. RNCM is familiar with patient due to previous left shoulder arthroplasty in 2024. His daughter lives with him and will be able to assist after discharge. Reviewed post op care instructions. Anticipate OPPT will be scheduled when appropriate per Dr. August Saucer. Was provided sling by [employer] prior to surgery. Will continue to follow for needs.

## 2023-08-07 NOTE — Progress Notes (Addendum)
Anesthesia Chart Review:  Case: [id number] Date/Time: 08/08/23 1106   Procedures:      RIGHT REVERSE SHOULDER ARTHROPLASTY (Right: Shoulder)     BICEPS TENODESIS (Right)   Anesthesia type: General   Pre-op diagnosis: right shoulder rotator cuff arthropathy, biceps tendinitis   [facility] RO[facility] / [facility]   Surgeons: Cammy Copa, MD       DISCUSSION: Patient is a 68 year old male scheduled for the above procedure.   History includes smoking, COPD/emphysema (nocturnal O2), HTN, HLD, DM2 (diagnosed 10/2014), CAD (10/2019 stress test suggested old inferior MI; 09/2020 CCTA showed CAC 0 with no CAD), cerebral aneurysm, hepatis steatosis, GERD, exertional dyspnea, full dentures, hernia (left IHR 05/15/18), left reverse TSA (08/30/22), left neck lipoma excision (9/56/24).  He is not routinely followed by cardiology. He had [place] admission in April 2021 for chest pain. Troponins "unremarkable." He had a nuclear stress test on 11/01/19 that showed a large inferior defect felt consistent with prior inferior infarct, but with no ischemia, and normal wall motion and EF 58%.  Smoking cessation encouraged as well as good DM control. ASA added. Continue statin. Per discharge summary, cardiology did not admission symptoms were cardiac in nature. Primary care follow-up planned. He subsequently had an echo on 04/28/20 that showed LVEF 60-65%, no regional wall motion abnormalities, mild LVH, grade 1 diastolic dysfunction, normal RV systolic function, moderately elevated RVSP 54.7 mmHg. Most recently, he had cardiology evaluation by Armanda Magic, MD during March 2022 admission for chest pain and SOB. He was diagnosed with esophageal candidiasis; however, he did have a Coronary CT then that showed a CAC of 0, aortic atherosclerosis, but no evidence of CAD. He was also treated for HHS with IVF and insulin.   Recent PCP follow-up for chronic medical conditions on 08/02/23 by Georgina Quint, MD. A1c 6.7%  then. Last Trulicity 1/30/256. Last Fargixa planned 08/04/23. Still smoking. On Brezrtri for COPD> on 08/02/23. HTN well controlled. CAD felt stable without angina, continue ASA and statin recommended. 08/04/23 EKG showed NSR, possible LAE, cannot rule out inferior infarct (old), anteroseptal T wave abnormality which was present on tracings from 2023, but interpreting cardiologist thought inversion was more prominent. He denied chest pain and SOB at PAT RN interview.   UA showed moderate leukocytes, negative nitrites. Nasal PCR + Staph aureus/MRSA. Dr. August Saucer wants to add Vancomycin to preoperative antibiotics. Ortho advised hold ASA after 08/04/23 dose. Anesthesia team to evaluate on the day of surgery. Tolerated left reverse TSA a year ago and lipoma excision in September.    VS: BP 125/80   Pulse 94   Temp 36.7 C   Resp 16   Ht 5\' 8"  (1.727 m)   Wt 74.4 kg   SpO2 96%   BMI 24.94 kg/m    PROVIDERS: Georgina Quint, MD is PCP  Lisbeth Renshaw, MD is neurosurgeon   LABS: Preoperative labs noted. LFTs normal 12/29/22.  (all labs ordered are listed, but only abnormal results are displayed)  Labs Reviewed  SURGICAL PCR SCREEN - Abnormal; Notable for the following components:      Result Value   MRSA, PCR POSITIVE (*)    Staphylococcus aureus POSITIVE (*)    All other components within normal limits  GLUCOSE, CAPILLARY - Abnormal; Notable for the following components:   Glucose-Capillary 170 (*)    All other components within normal limits  CBC - Abnormal; Notable for the following components:   Platelets 405 (*)  All other components within normal limits  BASIC METABOLIC PANEL - Abnormal; Notable for the following components:   Glucose, Bld 117 (*)    All other components within normal limits  URINALYSIS, W/ REFLEX TO CULTURE (INFECTION SUSPECTED) - Abnormal; Notable for the following components:   Glucose, UA >=500 (*)    Hgb urine dipstick SMALL (*)    Leukocytes,Ua  MODERATE (*)    All other components within normal limits     IMAGES: CT Right shoulder 06/16/23: IMPRESSION: 1. High-riding humeral head and fatty atrophy of the supraspinatus, infraspinatus and subscapularis most consistent with chronic rotator cuff tears. 2. 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 - Emphysema (ICD10-J43.9).  MRA Head 06/13/22: IMPRESSION: Stable examination. 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 No enlargement or change in contour.    EKG: EKG 08/04/23: Normal sinus rhythm Possible Left atrial enlargement Cannot rule out Inferior infarct , age undetermined ST & T wave inversion more evident in Anteroseptal leads Consider Ischemia Abnormal ECG When compared with ECG of 18-Mar-2022 20:31, Confirmed by Bryan Lemma ([id number]) on 08/05/2023 5:22:38 PM   CV: CT Coronary 09/15/20: - Aorta: Normal size. Scattered calcifications in descending thoracic aorta. No dissection. - Aortic Valve:  Trileaflet.  Minimal calcifications. - Coronary Arteries:  Normal coronary origin.  Right dominance. RCA is a large dominant artery that gives rise to PDA and PLVB. There is no plaque.   Left main is a large artery that gives rise to LAD, Ramus and LCX arteries. There is no plaque.   LAD is a large vessel that gives rise to a small D1 and D2. There is no plaque.   Ramus is a small vessel with no plaque.   LCX is a non-dominant artery that gives rise to one large OM1 branch. There is no plaque.   - Other findings: Normal pulmonary vein drainage into the left atrium. Normal let atrial appendage without a thrombus. Normal size of the pulmonary artery.   IMPRESSION: 1. Coronary calcium score of 0. This was 0 percentile for age and sex matched control. 2.  Normal coronary origin with right dominance. 3.  No evidence of CAD.  CAD RADS 0. 4.  Aortic atherosclerosis. 5.  Consider non atherosclerotic causes of  chest pain.    Echo 04/28/20: IMPRESSIONS   1. Left ventricular ejection fraction, by estimation, is 60 to 65%. The  left ventricle has normal function. The left ventricle has no regional  wall motion abnormalities. There is mild concentric left ventricular  hypertrophy. Left ventricular diastolic  parameters are consistent with Grade I diastolic dysfunction (impaired  relaxation).   2. Right ventricular systolic function is normal. The right ventricular  size is normal. There is moderately elevated pulmonary artery systolic  pressure. The estimated right ventricular systolic pressure is 54.7 mmHg.   3. The mitral valve is normal in structure. No evidence of mitral valve  regurgitation. No evidence of mitral stenosis.   4. The aortic valve is normal in structure. Aortic valve regurgitation is  not visualized. No aortic stenosis is present.   5. The inferior vena cava is dilated in size with <50% respiratory  variability, suggesting right atrial pressure of 15 mmHg.    Nuclear stress test 11/02/19: There was no ST segment deviation noted during stress. No T wave inversion was noted during stress. Defect 1: There is a large defect of severe severity present in the basal inferior and mid inferior location. This is a low risk study. The  left ventricular ejection fraction is normal (55-65%). Nuclear stress EF: 58%.   Abnormal, low risk stress nuclear study with prior inferior infarct; no ischemia; EF 58 with normal wall motion.   Past Medical History:  Diagnosis Date   Cerebral aneurysm, nonruptured    followed by dr n. 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   Coronary artery disease    (pt admitted for chest pain ) nuclear stress test 11-02-2019  low risk, nuclear ef 58%,  prior inferior defect of severe severity indicative of previous MI;;   echo 04-28-2020 ef 60-65%, mild concentric LVH, RSVP  54.61mmHg;;   cardiac CTA 09-15-2020  unremarkable,  calcium score=zero   Dependence on nocturnal oxygen therapy    02-27-2023  per pt daughter,  pt only uses at night w/ O2 sat ,2-3L via [place],  only uses portable if going outside when every hot which is not much   Diverticulosis of colon    DOE (dyspnea on exertion)    02-27-2023  per pt daugter sob w/ stairs, incline's,  ok w/ some yard work if not warm/ hot wearther,  can do house hold chores   ED (erectile dysfunction)    Emphysema/COPD (HCC)    followed by pcp;   uses breztri bid, uses preventil nebulizer every afternoon;   last exacerbation 06/ 2023 in epic   Full dentures    GERD (gastroesophageal reflux disease)    Hepatic steatosis    History of adenomatous polyp of colon    History of gastric ulcer    per EGD 09-16-2020  non-bleeding   History of MI (myocardial infarction)    per nuclear stress test in epic 11-02-2019 showed prior inferior defect of severe severity indicative of previous MI   History of pelvic fracture    1999  and 2009   Hyperlipidemia    Hypertension    Smokers' cough (HCC)    Type 2 diabetes mellitus treated with insulin (HCC)    followed by pcp;  dx 2016   (02-27-2023  per pt daughter pt   Wears glasses     Past Surgical History:  Procedure Laterality Date   BIOPSY  09/16/2020   Procedure: BIOPSY;  Surgeon: Tressia Danas, MD;  Location: [facility];  Service: Gastroenterology;;   CHOLECYSTECTOMY N/A 03/20/2022   Procedure: LAPAROSCOPIC CHOLECYSTECTOMY;  Surgeon: Darnell Level, MD;  Location: [facility];  Service: General;  Laterality: N/A;   COLONOSCOPY WITH PROPOFOL  05/10/2018   dr Adela Lank   ESOPHAGOGASTRODUODENOSCOPY N/A 09/23/2015   Procedure: ESOPHAGOGASTRODUODENOSCOPY (EGD);  Surgeon: Meryl Dare, MD;  Location: [facility];  Service: Endoscopy;  Laterality: N/A;   ESOPHAGOGASTRODUODENOSCOPY (EGD) WITH PROPOFOL N/A 09/16/2020   Procedure: ESOPHAGOGASTRODUODENOSCOPY (EGD) WITH PROPOFOL;   Surgeon: Tressia Danas, MD;  Location: [facility];  Service: Gastroenterology;  Laterality: N/A;   EXCISION MASS NECK Left 03/09/2023   Procedure: EXCISION SOFT TISSUE MASS LEFT POSTERIOR NECK;  Surgeon: Darnell Level, MD;  Location: [facility];  Service: General;  Laterality: Left;   FOOT SURGERY Left 1999   INGUINAL HERNIA REPAIR Left 05/15/2018   Procedure: LEFT INGUINAL HERNIA REPAIR WITH MESH;  Surgeon: Harriette Bouillon, MD;  Location: [facility];  Service: General;  Laterality: Left;   INSERTION OF MESH Left 05/15/2018   Procedure: INSERTION OF MESH;  Surgeon: Harriette Bouillon, MD;  Location: [facility];  Service: General;  Laterality: Left;   INTRAOPERATIVE CHOLANGIOGRAM N/A 03/20/2022   Procedure: INTRAOPERATIVE CHOLANGIOGRAM;  Surgeon: Darnell Level, MD;  Location: [facility];  Service: General;  Laterality: N/A;   REVERSE SHOULDER ARTHROPLASTY Left 08/30/2022   Procedure: LEFT REVERSE SHOULDER ARTHROPLASTY;  Surgeon: Cammy Copa, MD;  Location: [facility] OR;  Service: Orthopedics;  Laterality: Left;   SHOULDER ARTHROSCOPY W/ ROTATOR CUFF REPAIR Left    yrs ago    MEDICATIONS:  Dulaglutide (TRULICITY) 1.5 MG/0.5ML SOAJ   albuterol (PROVENTIL) (2.5 MG/3ML) 0.083% nebulizer solution   albuterol (VENTOLIN HFA) 108 (90 Base) MCG/ACT inhaler   amLODipine (NORVASC) 5 MG tablet   aspirin EC 81 MG tablet   atorvastatin (LIPITOR) 20 MG tablet   blood glucose meter kit and supplies KIT   blood glucose meter kit and supplies   Blood Glucose Monitoring Suppl (ONE TOUCH ULTRA 2) w/Device KIT   Budeson-Glycopyrrol-Formoterol (BREZTRI AEROSPHERE) 160-9-4.8 MCG/ACT AERO   FARXIGA 10 MG TABS tablet   gabapentin (NEURONTIN) 300 MG capsule   glipiZIDE (GLUCOTROL) 10 MG tablet   HYDROcodone-acetaminophen (NORCO/VICODIN) 5-325 MG tablet   ibuprofen (ADVIL) 600 MG tablet   insulin aspart (NOVOLOG FLEXPEN) 100 UNIT/ML FlexPen   Insulin Pen Needle (B-D ULTRAFINE III SHORT PEN) 31G X 8 MM MISC   Insulin Pen Needle  (B-D ULTRAFINE III SHORT PEN) 31G X 8 MM MISC   methocarbamol (ROBAXIN) 500 MG tablet   nicotine (NICODERM CQ) 14 mg/24hr patch   ONETOUCH ULTRA test strip   pantoprazole (PROTONIX) 40 MG tablet   sildenafil (VIAGRA) 100 MG tablet   TRESIBA FLEXTOUCH 100 UNIT/ML FlexTouch Pen   Ubrogepant (UBRELVY) 100 MG TABS   No current facility-administered medications for this encounter.    morphine 2 MG/ML injection    Shonna Chock, PA-C Surgical Short Stay/Anesthesiology [facility] Phone [phone] [facility] Phone [phone] 08/07/2023 2:35 PM

## 2023-08-08 ENCOUNTER — Ambulatory Visit (HOSPITAL_BASED_OUTPATIENT_CLINIC_OR_DEPARTMENT_OTHER): Payer: Medicare HMO | Admitting: Anesthesiology

## 2023-08-08 ENCOUNTER — Ambulatory Visit (HOSPITAL_COMMUNITY): Payer: Self-pay | Admitting: Vascular Surgery

## 2023-08-08 ENCOUNTER — Observation Stay (HOSPITAL_COMMUNITY)
Admission: RE | Admit: 2023-08-08 | Discharge: 2023-08-09 | Disposition: A | Discharge status: Home / Self Care | Payer: Medicare HMO | Attending: Orthopedic Surgery | Admitting: Orthopedic Surgery

## 2023-08-08 ENCOUNTER — Encounter (HOSPITAL_COMMUNITY)
Admission: RE | Disposition: A | Discharge status: Home / Self Care | Payer: Self-pay | Source: Home / Self Care | Attending: Orthopedic Surgery

## 2023-08-08 ENCOUNTER — Encounter (HOSPITAL_COMMUNITY): Payer: Self-pay | Admitting: Orthopedic Surgery

## 2023-08-08 ENCOUNTER — Other Ambulatory Visit: Payer: Self-pay

## 2023-08-08 ENCOUNTER — Observation Stay (HOSPITAL_COMMUNITY): Payer: Medicare HMO

## 2023-08-08 DIAGNOSIS — F17210 Nicotine dependence, cigarettes, uncomplicated: Secondary | ICD-10-CM

## 2023-08-08 DIAGNOSIS — M19011 Primary osteoarthritis, right shoulder: Secondary | ICD-10-CM

## 2023-08-08 DIAGNOSIS — I2510 Atherosclerotic heart disease of native coronary artery without angina pectoris: Secondary | ICD-10-CM | POA: Diagnosis not present

## 2023-08-08 DIAGNOSIS — Z471 Aftercare following joint replacement surgery: Secondary | ICD-10-CM | POA: Diagnosis not present

## 2023-08-08 DIAGNOSIS — Z7982 Long term (current) use of aspirin: Secondary | ICD-10-CM | POA: Insufficient documentation

## 2023-08-08 DIAGNOSIS — M12811 Other specific arthropathies, not elsewhere classified, right shoulder: Principal | ICD-10-CM | POA: Insufficient documentation

## 2023-08-08 DIAGNOSIS — I10 Essential (primary) hypertension: Secondary | ICD-10-CM | POA: Insufficient documentation

## 2023-08-08 DIAGNOSIS — J449 Chronic obstructive pulmonary disease, unspecified: Secondary | ICD-10-CM | POA: Insufficient documentation

## 2023-08-08 DIAGNOSIS — Z96611 Presence of right artificial shoulder joint: Secondary | ICD-10-CM | POA: Diagnosis not present

## 2023-08-08 DIAGNOSIS — Z794 Long term (current) use of insulin: Secondary | ICD-10-CM | POA: Insufficient documentation

## 2023-08-08 DIAGNOSIS — Z79899 Other long term (current) drug therapy: Secondary | ICD-10-CM | POA: Diagnosis not present

## 2023-08-08 DIAGNOSIS — Z7984 Long term (current) use of oral hypoglycemic drugs: Secondary | ICD-10-CM | POA: Insufficient documentation

## 2023-08-08 DIAGNOSIS — M7521 Bicipital tendinitis, right shoulder: Secondary | ICD-10-CM | POA: Insufficient documentation

## 2023-08-08 DIAGNOSIS — E119 Type 2 diabetes mellitus without complications: Secondary | ICD-10-CM | POA: Diagnosis not present

## 2023-08-08 DIAGNOSIS — Z96612 Presence of left artificial shoulder joint: Secondary | ICD-10-CM | POA: Diagnosis not present

## 2023-08-08 DIAGNOSIS — Z01818 Encounter for other preprocedural examination: Principal | ICD-10-CM

## 2023-08-08 DIAGNOSIS — G8918 Other acute postprocedural pain: Secondary | ICD-10-CM | POA: Diagnosis not present

## 2023-08-08 HISTORY — PX: BICEPT TENODESIS: SHX5116

## 2023-08-08 HISTORY — PX: REVERSE SHOULDER ARTHROPLASTY: SHX5054

## 2023-08-08 LAB — GLUCOSE, CAPILLARY
Glucose-Capillary: 212 mg/dL — ABNORMAL HIGH (ref 70–99)
Glucose-Capillary: 126 mg/dL — ABNORMAL HIGH (ref 70–99)
Glucose-Capillary: 110 mg/dL — ABNORMAL HIGH (ref 70–99)
Glucose-Capillary: 324 mg/dL — ABNORMAL HIGH (ref 70–99)

## 2023-08-08 SURGERY — ARTHROPLASTY, SHOULDER, TOTAL, REVERSE
Anesthesia: Regional | Site: Shoulder | Laterality: Right

## 2023-08-08 MED ORDER — FENTANYL CITRATE (PF) 100 MCG/2ML IJ SOLN: 50.0000 ug | Freq: Once | INTRAMUSCULAR | Status: AC

## 2023-08-08 MED ORDER — INSULIN ASPART 100 UNIT/ML IJ SOLN
0.0000 [IU] | Freq: Every day | INTRAMUSCULAR | Status: DC
  Administered 2023-08-08: 4 [IU] via SUBCUTANEOUS

## 2023-08-08 MED ORDER — METOCLOPRAMIDE HCL 5 MG/ML IJ SOLN: 5.0000 mg | Freq: Three times a day (TID) | INTRAMUSCULAR | Status: DC | PRN

## 2023-08-08 MED ORDER — PROPOFOL 10 MG/ML IV BOLUS
INTRAVENOUS | Status: AC
  Filled 2023-08-08: qty 20

## 2023-08-08 MED ORDER — METHOCARBAMOL 1000 MG/10ML IJ SOLN: 500.0000 mg | Freq: Four times a day (QID) | INTRAMUSCULAR | Status: DC | PRN

## 2023-08-08 MED ORDER — IRRISEPT - 450ML BOTTLE WITH 0.05% CHG IN STERILE WATER, USP 99.95% OPTIME
TOPICAL | Status: DC | PRN
  Administered 2023-08-08: 450 mL via TOPICAL

## 2023-08-08 MED ORDER — CEFAZOLIN SODIUM-DEXTROSE 2-4 GM/100ML-% IV SOLN
INTRAVENOUS | Status: AC
  Filled 2023-08-08: qty 100

## 2023-08-08 MED ORDER — PHENYLEPHRINE HCL-NACL 20-0.9 MG/250ML-% IV SOLN
INTRAVENOUS | Status: DC | PRN
  Administered 2023-08-08: 30 ug/min via INTRAVENOUS

## 2023-08-08 MED ORDER — BUPIVACAINE LIPOSOME 1.3 % IJ SUSP
INTRAMUSCULAR | Status: DC | PRN
  Administered 2023-08-08: 10 mL via PERINEURAL

## 2023-08-08 MED ORDER — GLIPIZIDE 10 MG PO TABS
10.0000 mg | ORAL_TABLET | Freq: Every day | ORAL | Status: DC
  Administered 2023-08-09: 10 mg via ORAL
  Filled 2023-08-08: qty 2
  Filled 2023-08-08: qty 1

## 2023-08-08 MED ORDER — FENTANYL CITRATE (PF) 100 MCG/2ML IJ SOLN
25.0000 ug | INTRAMUSCULAR | Status: DC | PRN
Start: 2023-08-08 — End: 2023-08-08
  Administered 2023-08-08 (×3): 25 ug via INTRAVENOUS

## 2023-08-08 MED ORDER — INSULIN ASPART 100 UNIT/ML IJ SOLN
0.0000 [IU] | Freq: Three times a day (TID) | INTRAMUSCULAR | Status: DC
  Administered 2023-08-09: 3 [IU] via SUBCUTANEOUS

## 2023-08-08 MED ORDER — TRANEXAMIC ACID-NACL 1000-0.7 MG/100ML-% IV SOLN
1000.0000 mg | INTRAVENOUS | Status: AC
  Administered 2023-08-08: 1000 mg via INTRAVENOUS

## 2023-08-08 MED ORDER — METOCLOPRAMIDE HCL 5 MG PO TABS
5.0000 mg | ORAL_TABLET | Freq: Three times a day (TID) | ORAL | Status: DC | PRN
Start: 2023-08-08 — End: 2023-08-09

## 2023-08-08 MED ORDER — HYDROMORPHONE HCL 1 MG/ML IJ SOLN: 0.5000 mg | INTRAMUSCULAR | Status: DC | PRN

## 2023-08-08 MED ORDER — 0.9 % SODIUM CHLORIDE (POUR BTL) OPTIME
TOPICAL | Status: DC | PRN
  Administered 2023-08-08: 1000 mL

## 2023-08-08 MED ORDER — FENTANYL CITRATE (PF) 250 MCG/5ML IJ SOLN
INTRAMUSCULAR | Status: AC
  Filled 2023-08-08: qty 5

## 2023-08-08 MED ORDER — ASPIRIN 81 MG PO TBEC
81.0000 mg | DELAYED_RELEASE_TABLET | Freq: Every day | ORAL | Status: DC
  Administered 2023-08-09: 81 mg via ORAL
  Filled 2023-08-08: qty 1

## 2023-08-08 MED ORDER — ALBUMIN HUMAN 5 % IV SOLN: INTRAVENOUS | Status: DC | PRN

## 2023-08-08 MED ORDER — OXYCODONE HCL 5 MG PO TABS
ORAL_TABLET | ORAL | Status: AC
  Filled 2023-08-08: qty 1

## 2023-08-08 MED ORDER — ACETAMINOPHEN 500 MG PO TABS
1000.0000 mg | ORAL_TABLET | Freq: Four times a day (QID) | ORAL | Status: DC
  Administered 2023-08-08 – 2023-08-09 (×3): 1000 mg via ORAL
  Filled 2023-08-08 (×3): qty 2

## 2023-08-08 MED ORDER — PROPOFOL 10 MG/ML IV BOLUS
INTRAVENOUS | Status: DC | PRN
  Administered 2023-08-08: 150 mg via INTRAVENOUS

## 2023-08-08 MED ORDER — VANCOMYCIN HCL 1000 MG IV SOLR
INTRAVENOUS | Status: DC | PRN
  Administered 2023-08-08: 1000 mg via TOPICAL

## 2023-08-08 MED ORDER — PHENOL 1.4 % MT LIQD: 1.0000 | OROMUCOSAL | Status: DC | PRN

## 2023-08-08 MED ORDER — CEFAZOLIN SODIUM-DEXTROSE 2-4 GM/100ML-% IV SOLN
2.0000 g | INTRAVENOUS | Status: AC
  Administered 2023-08-08: 2 g via INTRAVENOUS

## 2023-08-08 MED ORDER — DAPAGLIFLOZIN PROPANEDIOL 10 MG PO TABS
10.0000 mg | ORAL_TABLET | Freq: Every day | ORAL | Status: DC
  Administered 2023-08-09: 10 mg via ORAL
  Filled 2023-08-08: qty 1

## 2023-08-08 MED ORDER — ONDANSETRON HCL 4 MG/2ML IJ SOLN
4.0000 mg | Freq: Four times a day (QID) | INTRAMUSCULAR | Status: DC | PRN
Start: 2023-08-08 — End: 2023-08-09

## 2023-08-08 MED ORDER — VANCOMYCIN HCL 1000 MG IV SOLR
INTRAVENOUS | Status: AC
  Filled 2023-08-08: qty 20

## 2023-08-08 MED ORDER — ALBUTEROL SULFATE (2.5 MG/3ML) 0.083% IN NEBU
2.5000 mg | INHALATION_SOLUTION | Freq: Four times a day (QID) | RESPIRATORY_TRACT | Status: DC
  Administered 2023-08-08 – 2023-08-09 (×3): 2.5 mg via RESPIRATORY_TRACT
  Filled 2023-08-08 (×3): qty 3

## 2023-08-08 MED ORDER — CHLORHEXIDINE GLUCONATE 0.12 % MT SOLN
15.0000 mL | Freq: Once | OROMUCOSAL | Status: AC
  Administered 2023-08-08: 15 mL via OROMUCOSAL
  Filled 2023-08-08: qty 15

## 2023-08-08 MED ORDER — LACTATED RINGERS IV SOLN
INTRAVENOUS | Status: DC
Start: 2023-08-08 — End: 2023-08-08

## 2023-08-08 MED ORDER — ONDANSETRON HCL 4 MG/2ML IJ SOLN
INTRAMUSCULAR | Status: AC
  Filled 2023-08-08: qty 2

## 2023-08-08 MED ORDER — ROCURONIUM BROMIDE 10 MG/ML (PF) SYRINGE
PREFILLED_SYRINGE | INTRAVENOUS | Status: DC | PRN
  Administered 2023-08-08: 60 mg via INTRAVENOUS

## 2023-08-08 MED ORDER — OXYCODONE HCL 5 MG PO TABS
5.0000 mg | ORAL_TABLET | ORAL | Status: DC | PRN
  Administered 2023-08-08 – 2023-08-09 (×4): 5 mg via ORAL
  Filled 2023-08-08: qty 1
  Filled 2023-08-08: qty 2
  Filled 2023-08-08: qty 1

## 2023-08-08 MED ORDER — ONDANSETRON HCL 4 MG/2ML IJ SOLN
INTRAMUSCULAR | Status: DC | PRN
  Administered 2023-08-08: 4 mg via INTRAVENOUS

## 2023-08-08 MED ORDER — MENTHOL 3 MG MT LOZG: 1.0000 | LOZENGE | OROMUCOSAL | Status: DC | PRN

## 2023-08-08 MED ORDER — ROCURONIUM BROMIDE 10 MG/ML (PF) SYRINGE
PREFILLED_SYRINGE | INTRAVENOUS | Status: AC
  Filled 2023-08-08: qty 10

## 2023-08-08 MED ORDER — ACETAMINOPHEN 325 MG PO TABS
325.0000 mg | ORAL_TABLET | Freq: Four times a day (QID) | ORAL | Status: DC | PRN
Start: 2023-08-09 — End: 2023-08-09

## 2023-08-08 MED ORDER — LIDOCAINE 2% (20 MG/ML) 5 ML SYRINGE
INTRAMUSCULAR | Status: AC
  Filled 2023-08-08: qty 5

## 2023-08-08 MED ORDER — CEFAZOLIN SODIUM-DEXTROSE 2-4 GM/100ML-% IV SOLN
2.0000 g | Freq: Three times a day (TID) | INTRAVENOUS | Status: DC
  Administered 2023-08-08 – 2023-08-09 (×2): 2 g via INTRAVENOUS
  Filled 2023-08-08 (×2): qty 100

## 2023-08-08 MED ORDER — FENTANYL CITRATE (PF) 100 MCG/2ML IJ SOLN
INTRAMUSCULAR | Status: AC
  Administered 2023-08-08: 50 ug via INTRAVENOUS
  Filled 2023-08-08: qty 2

## 2023-08-08 MED ORDER — CELECOXIB 100 MG PO CAPS
100.0000 mg | ORAL_CAPSULE | Freq: Two times a day (BID) | ORAL | Status: DC
  Administered 2023-08-08 – 2023-08-09 (×2): 100 mg via ORAL
  Filled 2023-08-08 (×2): qty 1

## 2023-08-08 MED ORDER — MIDAZOLAM HCL 2 MG/2ML IJ SOLN: 2.0000 mg | Freq: Once | INTRAMUSCULAR | Status: AC

## 2023-08-08 MED ORDER — FENTANYL CITRATE (PF) 250 MCG/5ML IJ SOLN
INTRAMUSCULAR | Status: DC | PRN
  Administered 2023-08-08: 100 ug via INTRAVENOUS

## 2023-08-08 MED ORDER — POVIDONE-IODINE 7.5 % EX SOLN
Freq: Once | CUTANEOUS | Status: DC
  Filled 2023-08-08: qty 118

## 2023-08-08 MED ORDER — PANTOPRAZOLE SODIUM 40 MG PO TBEC
40.0000 mg | DELAYED_RELEASE_TABLET | Freq: Every day | ORAL | Status: DC
  Administered 2023-08-09: 40 mg via ORAL
  Filled 2023-08-08: qty 1

## 2023-08-08 MED ORDER — ACETAMINOPHEN 500 MG PO TABS
ORAL_TABLET | ORAL | Status: AC
  Administered 2023-08-08: 1000 mg via ORAL
  Filled 2023-08-08: qty 2

## 2023-08-08 MED ORDER — MIDAZOLAM HCL 2 MG/2ML IJ SOLN
INTRAMUSCULAR | Status: AC
  Administered 2023-08-08: 2 mg via INTRAVENOUS
  Filled 2023-08-08: qty 2

## 2023-08-08 MED ORDER — ACETAMINOPHEN 500 MG PO TABS: 1000.0000 mg | ORAL_TABLET | Freq: Once | ORAL | Status: AC

## 2023-08-08 MED ORDER — DEXAMETHASONE SODIUM PHOSPHATE 10 MG/ML IJ SOLN
INTRAMUSCULAR | Status: AC
  Filled 2023-08-08: qty 1

## 2023-08-08 MED ORDER — EPHEDRINE SULFATE-NACL 50-0.9 MG/10ML-% IV SOSY
PREFILLED_SYRINGE | INTRAVENOUS | Status: DC | PRN
  Administered 2023-08-08: 5 mg via INTRAVENOUS

## 2023-08-08 MED ORDER — DOCUSATE SODIUM 100 MG PO CAPS
100.0000 mg | ORAL_CAPSULE | Freq: Two times a day (BID) | ORAL | Status: DC
  Administered 2023-08-08 – 2023-08-09 (×2): 100 mg via ORAL
  Filled 2023-08-08 (×2): qty 1

## 2023-08-08 MED ORDER — METHOCARBAMOL 500 MG PO TABS
ORAL_TABLET | ORAL | Status: AC
  Filled 2023-08-08: qty 1

## 2023-08-08 MED ORDER — ONDANSETRON HCL 4 MG PO TABS: 4.0000 mg | ORAL_TABLET | Freq: Four times a day (QID) | ORAL | Status: DC | PRN

## 2023-08-08 MED ORDER — FENTANYL CITRATE (PF) 100 MCG/2ML IJ SOLN
INTRAMUSCULAR | Status: AC
  Filled 2023-08-08: qty 2

## 2023-08-08 MED ORDER — INSULIN ASPART 100 UNIT/ML IJ SOLN
4.0000 [IU] | Freq: Three times a day (TID) | INTRAMUSCULAR | Status: DC
  Administered 2023-08-09: 4 [IU] via SUBCUTANEOUS

## 2023-08-08 MED ORDER — SUGAMMADEX SODIUM 200 MG/2ML IV SOLN
INTRAVENOUS | Status: DC | PRN
  Administered 2023-08-08: 200 mg via INTRAVENOUS

## 2023-08-08 MED ORDER — ALBUTEROL SULFATE HFA 108 (90 BASE) MCG/ACT IN AERS: 1.0000 | INHALATION_SPRAY | RESPIRATORY_TRACT | Status: DC

## 2023-08-08 MED ORDER — POVIDONE-IODINE 10 % EX SWAB
2.0000 | Freq: Once | CUTANEOUS | Status: AC
  Administered 2023-08-08: 2 via TOPICAL

## 2023-08-08 MED ORDER — INSULIN ASPART 100 UNIT/ML IJ SOLN
0.0000 [IU] | INTRAMUSCULAR | Status: DC | PRN
  Administered 2023-08-08: 4 [IU] via SUBCUTANEOUS
  Filled 2023-08-08 (×2): qty 1

## 2023-08-08 MED ORDER — TRANEXAMIC ACID-NACL 1000-0.7 MG/100ML-% IV SOLN
INTRAVENOUS | Status: AC
  Filled 2023-08-08: qty 100

## 2023-08-08 MED ORDER — METHOCARBAMOL 500 MG PO TABS
500.0000 mg | ORAL_TABLET | Freq: Four times a day (QID) | ORAL | Status: DC | PRN
  Administered 2023-08-08: 500 mg via ORAL

## 2023-08-08 MED ORDER — VANCOMYCIN HCL IN DEXTROSE 1-5 GM/200ML-% IV SOLN: 1000.0000 mg | INTRAVENOUS | Status: AC

## 2023-08-08 MED ORDER — VANCOMYCIN HCL IN DEXTROSE 1-5 GM/200ML-% IV SOLN
INTRAVENOUS | Status: AC
  Administered 2023-08-08: 1000 mg via INTRAVENOUS
  Filled 2023-08-08: qty 200

## 2023-08-08 MED ORDER — AMLODIPINE BESYLATE 5 MG PO TABS: 5.0000 mg | ORAL_TABLET | Freq: Every day | ORAL | Status: DC

## 2023-08-08 MED ORDER — BUPIVACAINE-EPINEPHRINE (PF) 0.5% -1:200000 IJ SOLN
INTRAMUSCULAR | Status: DC | PRN
  Administered 2023-08-08: 20 mL via PERINEURAL

## 2023-08-08 MED ORDER — DEXAMETHASONE SODIUM PHOSPHATE 10 MG/ML IJ SOLN
INTRAMUSCULAR | Status: DC | PRN
  Administered 2023-08-08: 5 mg via INTRAVENOUS

## 2023-08-08 MED ORDER — ORAL CARE MOUTH RINSE: 15.0000 mL | Freq: Once | OROMUCOSAL | Status: AC

## 2023-08-08 SURGICAL SUPPLY — 66 items
ALCOHOL 70% 16 OZ (MISCELLANEOUS) ×2 IMPLANT
BAG COUNTER SPONGE SURGICOUNT (BAG) ×2 IMPLANT
BEARING HUMERAL SHLDER 36M STD (Shoulder) IMPLANT
BIT DRILL 2.7 W/STOP DISP (BIT) IMPLANT
BIT DRILL TWIST 2.7 (BIT) IMPLANT
BLADE SAW SGTL 13X75X1.27 (BLADE) ×2 IMPLANT
CHLORAPREP W/TINT 26 (MISCELLANEOUS) ×2 IMPLANT
COMP REV AUG LG W/TAPER/GLENOI (Joint) ×2 IMPLANT
COMPONENT RV AUG LG W/TAPR/GLN (Joint) IMPLANT
COOLER ICEMAN CLASSIC (MISCELLANEOUS) ×2 IMPLANT
COVER SURGICAL LIGHT HANDLE (MISCELLANEOUS) ×2 IMPLANT
DRAPE INCISE IOBAN 66X45 STRL (DRAPES) ×2 IMPLANT
DRAPE U-SHAPE 47X51 STRL (DRAPES) ×4 IMPLANT
DRSG AQUACEL AG ADV 3.5X10 (GAUZE/BANDAGES/DRESSINGS) ×2 IMPLANT
ELECT BLADE 4.0 EZ CLEAN MEGAD (MISCELLANEOUS) ×2 IMPLANT
ELECT REM PT RETURN 9FT ADLT (ELECTROSURGICAL) ×2 IMPLANT
ELECTRODE BLDE 4.0 EZ CLN MEGD (MISCELLANEOUS) ×2 IMPLANT
ELECTRODE REM PT RTRN 9FT ADLT (ELECTROSURGICAL) ×2 IMPLANT
GAUZE SPONGE 4X4 12PLY STRL LF (GAUZE/BANDAGES/DRESSINGS) ×2 IMPLANT
GLENOID SPHERE 36+6 (Joint) IMPLANT
GLOVE BIOGEL PI IND STRL 6.5 (GLOVE) ×2 IMPLANT
GLOVE BIOGEL PI IND STRL 8 (GLOVE) ×2 IMPLANT
GLOVE ECLIPSE 6.5 STRL STRAW (GLOVE) ×2 IMPLANT
GLOVE ECLIPSE 8.0 STRL XLNG CF (GLOVE) ×2 IMPLANT
GOWN STRL REUS W/ TWL LRG LVL3 (GOWN DISPOSABLE) ×2 IMPLANT
GOWN STRL REUS W/ TWL XL LVL3 (GOWN DISPOSABLE) ×2 IMPLANT
GUIDE BONE RSA SHLD ROT RT (ORTHOPEDIC DISPOSABLE SUPPLIES) IMPLANT
HYDROGEN PEROXIDE 16OZ (MISCELLANEOUS) ×2 IMPLANT
JET LAVAGE IRRISEPT WOUND (IRRIGATION / IRRIGATOR) ×2 IMPLANT
KIT BASIN OR (CUSTOM PROCEDURE TRAY) ×2 IMPLANT
KIT TURNOVER KIT B (KITS) ×2 IMPLANT
LAVAGE JET IRRISEPT WOUND (IRRIGATION / IRRIGATOR) ×2 IMPLANT
MANIFOLD NEPTUNE II (INSTRUMENTS) ×2 IMPLANT
NDL SUT 6 .5 CRC .975X.05 MAYO (NEEDLE) IMPLANT
NS IRRIG 1000ML POUR BTL (IV SOLUTION) ×2 IMPLANT
PACK SHOULDER (CUSTOM PROCEDURE TRAY) ×2 IMPLANT
PAD COLD SHLDR WRAP-ON (PAD) ×2 IMPLANT
PIN STEINMANN THREADED TIP (PIN) IMPLANT
PIN THREADED REVERSE (PIN) IMPLANT
REAMER GUIDE BUSHING SURG DISP (MISCELLANEOUS) IMPLANT
REAMER GUIDE W/SCREW AUG (MISCELLANEOUS) IMPLANT
RESTRAINT HEAD UNIVERSAL NS (MISCELLANEOUS) ×2 IMPLANT
RETRIEVER SUT HEWSON (MISCELLANEOUS) ×2 IMPLANT
SCREW BONE STRL 6.5MMX30MM (Screw) IMPLANT
SCREW LOCKING 4.75MMX15MM (Screw) IMPLANT
SCREW LOCKING NS 4.75MMX20MM (Screw) IMPLANT
SCREW LOCKING STRL 4.75X25X3.5 (Screw) IMPLANT
SHOULDER HUMERAL BEAR 36M STD (Shoulder) ×2 IMPLANT
SLING ARM IMMOBILIZER LRG (SOFTGOODS) ×2 IMPLANT
SOL PREP POV-IOD 4OZ 10% (MISCELLANEOUS) ×2 IMPLANT
SPONGE T-LAP 18X18 ~~LOC~~+RFID (SPONGE) ×2 IMPLANT
STEM HUMERAL STRL 12MMX83MM (Stem) IMPLANT
STRIP CLOSURE SKIN 1/2X4 (GAUZE/BANDAGES/DRESSINGS) ×2 IMPLANT
SUCTION TUBE FRAZIER 10FR DISP (SUCTIONS) ×2 IMPLANT
SUT BROADBAND TAPE 2PK 1.5 (SUTURE) IMPLANT
SUT MNCRL AB 3-0 PS2 18 (SUTURE) ×2 IMPLANT
SUT SILK 2 0 TIES 10X30 (SUTURE) ×2 IMPLANT
SUT VIC AB 0 CT1 27XBRD ANBCTR (SUTURE) ×8 IMPLANT
SUT VIC AB 1 CT1 27XBRD ANBCTR (SUTURE) ×4 IMPLANT
SUT VIC AB 1 CT1 36 (SUTURE) IMPLANT
SUT VIC AB 2-0 CT2 27 (SUTURE) ×6 IMPLANT
SUT VICRYL 0 UR6 27IN ABS (SUTURE) ×4 IMPLANT
TOWEL GREEN STERILE (TOWEL DISPOSABLE) ×2 IMPLANT
TRAY HUM REV SHOULDER STD +6 (Shoulder) IMPLANT
TUBE SUCT ARGYLE STRL (TUBING) IMPLANT
WATER STERILE IRR 1000ML POUR (IV SOLUTION) ×2 IMPLANT

## 2023-08-08 NOTE — Transfer of Care (Signed)
 Immediate Anesthesia Transfer of Care Note  Patient: Duane Cuevas  Procedure(s) Performed: RIGHT REVERSE SHOULDER ARTHROPLASTY (Right: Shoulder) BICEPS TENODESIS (Right)  Patient Location: PACU  Anesthesia Type:GA combined with regional for post-op pain  Level of Consciousness: awake, alert , oriented, and patient cooperative  Airway & Oxygen  Therapy: Patient Spontanous Breathing and Patient connected to nasal cannula oxygen   Post-op Assessment: Report given to RN and Post -op Vital signs reviewed and stable  Post vital signs: Reviewed and stable  Last Vitals:  Vitals Value Taken Time  BP 105/73 08/08/23 1501  Temp    Pulse 95 08/08/23 1503  Resp 30 08/08/23 1503  SpO2 97 % 08/08/23 1503  Vitals shown include unfiled device data.  Last Pain:  Vitals:   08/08/23 0931  TempSrc:   PainSc: 7          Complications: No notable events documented.

## 2023-08-08 NOTE — Brief Op Note (Signed)
   08/08/2023  2:57 PM  PATIENT:  Duane Cuevas  68 y.o. male  PRE-OPERATIVE DIAGNOSIS:  right shoulder rotator cuff arthropathy, biceps tendinitis  POST-OPERATIVE DIAGNOSIS:  right shoulder rotator cuff arthropathy, biceps tendinitis  PROCEDURE:  Procedure(s): RIGHT REVERSE SHOULDER ARTHROPLASTY BICEPS TENODESIS  SURGEON:  Surgeon(s): Addie, Cordella Hamilton, MD  ASSISTANT: magnant pa  ANESTHESIA:   general  EBL: 50 ml    Total I/O In: 950 [I.V.:500; IV Piggyback:450] Out: -   BLOOD ADMINISTERED: none  DRAINS: none   LOCAL MEDICATIONS USED:  none  SPECIMEN:  No Specimen  COUNTS:  YES  TOURNIQUET:  * No tourniquets in log *  DICTATION: .Other Dictation: Dictation Number [id number]  PLAN OF CARE: Admit for overnight observation  PATIENT DISPOSITION:  PACU - hemodynamically stable

## 2023-08-08 NOTE — H&P (Signed)
 Duane Cuevas is an 68 y.o. male.   Chief Complaint: Right shoulder pain HPI: Duane Cuevas is a 68 y.o. male who presents  reporting right shoulder pain.  Patient had right glenohumeral joint injection 05/04/2023.  That did not give him sustained relief.  Right shoulder wakes him from sleep at night.  He is doing reasonly well with left reverse shoulder replacement.  Patient reports pain with ADLs as well as weakness in the right arm.  No personal or family history of DVT or pulmonary embolism.  Past Medical History:  Diagnosis Date   Cerebral aneurysm, nonruptured    followed by dr n. 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   Coronary artery disease    (pt admitted for chest pain ) nuclear stress test 11-02-2019  low risk, nuclear ef 58%,  prior inferior defect of severe severity indicative of previous MI;;   echo 04-28-2020 ef 60-65%, mild concentric LVH, RSVP 54.58mmHg;;   cardiac CTA 09-15-2020  unremarkable,  calcium  score=zero   Dependence on nocturnal oxygen  therapy    02-27-2023  per pt daughter,  pt only uses at night w/ O2 sat ,2-3L via [place],  only uses portable if going outside when every hot which is not much   Diverticulosis of colon    DOE (dyspnea on exertion)    02-27-2023  per pt daugter sob w/ stairs, incline's,  ok w/ some yard work if not warm/ hot wearther,  can do house hold chores   ED (erectile dysfunction)    Emphysema/COPD (HCC)    followed by pcp;   uses breztri  bid, uses preventil nebulizer every afternoon;   last exacerbation 06/ 2023 in epic   Full dentures    GERD (gastroesophageal reflux disease)    Hepatic steatosis    History of adenomatous polyp of colon    History of gastric ulcer    per EGD 09-16-2020  non-bleeding   History of MI (myocardial infarction)    per nuclear stress test in epic 11-02-2019 showed prior inferior defect of severe severity indicative of  previous MI   History of pelvic fracture    1999  and 2009   Hyperlipidemia    Hypertension    Smokers' cough (HCC)    Type 2 diabetes mellitus treated with insulin  (HCC)    followed by pcp;  dx 2016   (02-27-2023  per pt daughter pt   Wears glasses     Past Surgical History:  Procedure Laterality Date   BIOPSY  09/16/2020   Procedure: BIOPSY;  Surgeon: Eda Iha, MD;  Location: [facility];  Service: Gastroenterology;;   CHOLECYSTECTOMY N/A 03/20/2022   Procedure: LAPAROSCOPIC CHOLECYSTECTOMY;  Surgeon: Eletha Boas, MD;  Location: [facility];  Service: General;  Laterality: N/A;   COLONOSCOPY WITH PROPOFOL   05/10/2018   dr leigh   ESOPHAGOGASTRODUODENOSCOPY N/A 09/23/2015   Procedure: ESOPHAGOGASTRODUODENOSCOPY (EGD);  Surgeon: Gwendlyn ONEIDA Buddy, MD;  Location: [facility];  Service: Endoscopy;  Laterality: N/A;   ESOPHAGOGASTRODUODENOSCOPY (EGD) WITH PROPOFOL  N/A 09/16/2020   Procedure: ESOPHAGOGASTRODUODENOSCOPY (EGD) WITH PROPOFOL ;  Surgeon: Eda Iha, MD;  Location: [facility];  Service: Gastroenterology;  Laterality: N/A;   EXCISION MASS NECK Left 03/09/2023   Procedure: EXCISION SOFT TISSUE MASS LEFT POSTERIOR NECK;  Surgeon: Eletha Boas, MD;  Location: [facility];  Service: General;  Laterality: Left;   FOOT SURGERY Left 1999   INGUINAL HERNIA REPAIR Left 05/15/2018   Procedure: LEFT INGUINAL HERNIA  REPAIR WITH MESH;  Surgeon: Vanderbilt Ned, MD;  Location: [facility];  Service: General;  Laterality: Left;   INSERTION OF MESH Left 05/15/2018   Procedure: INSERTION OF MESH;  Surgeon: Vanderbilt Ned, MD;  Location: [facility];  Service: General;  Laterality: Left;   INTRAOPERATIVE CHOLANGIOGRAM N/A 03/20/2022   Procedure: INTRAOPERATIVE CHOLANGIOGRAM;  Surgeon: Eletha Boas, MD;  Location: [facility];  Service: General;  Laterality: N/A;   REVERSE SHOULDER ARTHROPLASTY Left 08/30/2022   Procedure: LEFT REVERSE SHOULDER ARTHROPLASTY;  Surgeon: Addie Cordella Hamilton, MD;   Location: [facility];  Service: Orthopedics;  Laterality: Left;   SHOULDER ARTHROSCOPY W/ ROTATOR CUFF REPAIR Left    yrs ago    Family History  Problem Relation Age of Onset   Emphysema Mother    Diabetes Mother    Emphysema Father    Cancer Sister        Stomach cancer   Diabetes Sister    Stomach cancer Sister    Diabetes Brother    Colon cancer Neg Hx    Colon polyps Neg Hx    Esophageal cancer Neg Hx    Rectal cancer Neg Hx    Social History:  reports that he has been smoking cigarettes. He started smoking about 54 years ago. He has a 8.1 pack-year smoking history. He has never used smokeless tobacco. He reports that he does not currently use alcohol after a past usage of about 2.0 standard drinks of alcohol per week. He reports that he does not use drugs.  Allergies: No Known Allergies  Medications Prior to Admission  Medication Sig Dispense Refill   albuterol  (PROVENTIL ) (2.5 MG/3ML) 0.083% nebulizer solution INHALAR 1 FRASCO VIA NEBULIZADOR CADA 6 HORAS SEGUN SEA NECESARIO PARA SIBILANCIAS O FALTA DE AIRE 225 mL 10   albuterol  (VENTOLIN  HFA) 108 (90 Base) MCG/ACT inhaler INHALAR 2 BOCANADAS POR VIA ORAL CADA 6 HORAS SEGUN SEA NECESARIO PARA SIBILANCIAS O FALTA DE AIRE 18 g 10   amLODipine  (NORVASC ) 5 MG tablet TOMAR 1 TABLETA POR VAI ORAL UNA VEZ AL DIA 30 tablet 10   aspirin  EC 81 MG tablet Take 1 tablet (81 mg total) by mouth daily. Swallow whole. 30 tablet 0   atorvastatin  (LIPITOR) 20 MG tablet TOMAR 1 TABLETA POR VIA ORAL UNA VEZ AL DIA 90 tablet 3   Budeson-Glycopyrrol-Formoterol  (BREZTRI  AEROSPHERE) 160-9-4.8 MCG/ACT AERO INHALAR 2 BOCANADAS POR VIA ORAL DOS VECES AL DIA 10.7 g 10   Dulaglutide  (TRULICITY ) 1.5 MG/0.5ML SOAJ INYECTAR CONTENIDOS DE UN LAPIZ POR VIA SUBCUTANEA CADA SEMANA, EL MISMO DIA CADA SEMANA 2 mL 10   FARXIGA  10 MG TABS tablet TOMAR 1 TABLETA POR VIA ORAL UNA VEZ AL DIA 30 tablet 5   glipiZIDE  (GLUCOTROL ) 10 MG tablet TOMAR 1 TABLETA POR VIA ORAL UNA  VEZ AL DIA ANTES DEL DESAYUNO *TOMAR 1 TABLETA ADICIONAL SI AZUCAR EN LA SANGRE ESTA ALTA* 60 tablet 10   ibuprofen  (ADVIL ) 600 MG tablet Take 1 tablet (600 mg total) by mouth every 8 (eight) hours as needed. 30 tablet 0   insulin  aspart (NOVOLOG  FLEXPEN) 100 UNIT/ML FlexPen INYECTAR 3 UNIDADES POR VIA SUBCUTANEA TRES VECES AL DIA COMO INDICADO. AJUSTAR CANTIDAD DE INSULINA POR ESCALA MOVIL. MAS DOSIS 30 UNIDADES 15 mL 10   pantoprazole  (PROTONIX ) 40 MG tablet TOMAR 1 TABLETA POR VIA ORAL UNA VEZ AL DIA 30 tablet 10   TRESIBA  FLEXTOUCH 100 UNIT/ML FlexTouch Pen INYECTAR 25 UNIDADES POR VIA SUBCUTANEA DIARIAMENTE (Patient taking differently: Inject 30 Units into  the skin daily.) 15 mL 10   Ubrogepant  (UBRELVY ) 100 MG TABS TOMAR 1 TALBETA POR VIA ORAL UNA VEZ AL DIA 8 tablet 3   blood glucose meter kit and supplies KIT Dispense based on patient and insurance preference. Use up to four times daily as directed. 1 each 0   blood glucose meter kit and supplies Dispense based on patient and insurance preference. Use up to four times daily as directed. (FOR ICD-10 E10.9, E11.9). 1 each 0   Blood Glucose Monitoring Suppl (ONE TOUCH ULTRA 2) w/Device KIT Use as directed to check blood glucose up to 2 times a day 1 kit 0   gabapentin  (NEURONTIN ) 300 MG capsule Take 1 capsule (300 mg total) by mouth 2 (two) times daily. (Patient not taking: Reported on 03/03/2023) 60 capsule 0   HYDROcodone -acetaminophen  (NORCO/VICODIN) 5-325 MG tablet Take 1 tablet by mouth every 12 (twelve) hours as needed for moderate pain (pain score 4-6). (Patient not taking: Reported on 08/03/2023) 15 tablet 0   Insulin  Pen Needle (B-D ULTRAFINE III SHORT PEN) 31G X 8 MM MISC USAR PARA INYECTAR INSULINA 100 each 5   Insulin  Pen Needle (B-D ULTRAFINE III SHORT PEN) 31G X 8 MM MISC USAR PARA INYECTAR INSULINA 100 each 5   methocarbamol  (ROBAXIN ) 500 MG tablet Take 1 tablet (500 mg total) by mouth 2 (two) times daily. (Patient not taking: Reported  on 08/03/2023) 20 tablet 0   nicotine  (NICODERM CQ ) 14 mg/24hr patch Place 1 patch (14 mg total) onto the skin daily. (Patient not taking: Reported on 08/03/2023) 28 patch 0   ONETOUCH ULTRA test strip USE TO CHECK BLOOD SUGAR AS INSTRUCTED 100 strip 11   sildenafil  (VIAGRA ) 100 MG tablet Take 0.5-1 tablets (50-100 mg total) by mouth daily as needed for erectile dysfunction. (Patient taking differently: Take 50-100 mg by mouth daily as needed for erectile dysfunction.) 5 tablet 11    Results for orders placed or performed during the hospital encounter of 08/08/23 (from the past 48 hours)  Glucose, capillary     Status: Abnormal   Collection Time: 08/08/23  9:22 AM  Result Value Ref Range   Glucose-Capillary 212 (H) 70 - 99 mg/dL    Comment: Glucose reference range applies only to samples taken after fasting for at least 8 hours.   Comment 1 Notify RN    Comment 2 Document in Chart    No results found.  Review of Systems  Musculoskeletal:  Positive for arthralgias.  All other systems reviewed and are negative.   Blood pressure 134/86, pulse 97, temperature 97.8 F (36.6 C), temperature source Oral, resp. rate 18, height 5' 8 (1.727 m), weight 77.1 kg, SpO2 95%. Physical Exam Vitals reviewed.  HENT:     Head: Normocephalic.     Nose: Nose normal.  Eyes:     Pupils: Pupils are equal, round, and reactive to light.  Cardiovascular:     Rate and Rhythm: Normal rate.     Pulses: Normal pulses.  Abdominal:     General: Abdomen is flat.  Musculoskeletal:     Cervical back: Normal range of motion.  Skin:    General: Skin is warm.     Capillary Refill: Capillary refill takes less than 2 seconds.  Neurological:     Mental Status: He is alert.  Psychiatric:        Mood and Affect: Mood normal.    Ortho exam demonstrates a lot of grinding in that right shoulder with passive range  of motion. Deltoid is functional. Motor or sensory function to the right hand is intact radial pulses  intact. Patient has fairly profound weakness to external rotation strength testing at 3- out of 5 with reasonable subscap strength. No masses lymphadenopathy or skin changes noted in that shoulder girdle region.   Assessment/Plan Impression is severe end-stage rotator cuff arthropathy on the right-hand side.  Has failed conservative management.  Patient has done well with his left reverse shoulder replacement..  Risk and benefits are discussed including not limited to infection nerve vessel damage incomplete pain relief as well as incomplete functional restoration.  Patient understands and wishes to proceed.  All questions answered.  Patient specific instrumentation has been done to optimize implant position and implant longevity and function.   Follow-Up Instructions: No follow-ups on file.    Orders:     Orders Placed This Encounter  Procedures   CT SHOULDER RIGHT WO CONTRAST    No orders of the defined types were placed in this encounter.        Procedures: No procedures performed     Clinical Data: No additional findings.   Objective: Vital Signs: There were no vitals taken for this visit.   Physical Exam:  Constitutional: Patient appears well-developed HEENT:  Head: Normocephalic Eyes:EOM are normal Neck: Normal range of motion Cardiovascular: Normal rate Pulmonary/chest: Effort normal Neurologic: Patient is alert Skin: Skin is warm Psychiatric: Patient has normal mood and affect   Ortho Exam: Ortho exam demonstrates a lot of grinding in that right shoulder with passive range of motion.  Deltoid is functional.  Motor or sensory function to the right hand is intact radial pulses intact.  Patient has fairly profound weakness to external rotation strength testing at 3- out of 5 with reasonable subscap strength.  No masses lymphadenopathy or skin changes noted in that shoulder girdle region.   Specialty Comments:  No specialty comments available.   Imaging: No results  found.     PMFS History:     Patient Active Problem List    Diagnosis Date Noted   Hematoma of right shoulder 04/17/2023   Contusion of right shoulder 04/17/2023   Rotator cuff arthropathy, left 08/31/2022   S/P reverse total shoulder arthroplasty, left 08/30/2022   Seizure (HCC) 12/09/2021   Intractable persistent migraine aura without cerebral infarction and with status migrainosus 05/26/2021   Hypertension associated with diabetes (HCC) 09/21/2020   Uncontrolled type 2 diabetes mellitus with hyperglycemia (HCC) 09/13/2020   CAD (coronary artery disease) 01/23/2020   HLD (hyperlipidemia) 11/02/2019   History of MI (myocardial infarction) 11/02/2019   COPD ? GOLD III/ active smoker 08/14/2018   COPD (chronic obstructive pulmonary disease) (HCC) 10/14/2014   Dyslipidemia associated with type 2 diabetes mellitus (HCC) 10/14/2014        Past Medical History:  Diagnosis Date   Cerebral aneurysm, nonruptured      followed by dr n. 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   Coronary artery disease      (pt admitted for chest pain ) nuclear stress test 11-02-2019  low risk, nuclear ef 58%,  prior inferior defect of severe severity indicative of previous MI;;   echo 04-28-2020 ef 60-65%, mild concentric LVH, RSVP 54.73mmHg;;   cardiac CTA 09-15-2020  unremarkable,  calcium  score=zero   Dependence on nocturnal oxygen  therapy      02-27-2023  per pt daughter,  pt only uses at night w/ O2 sat ,  2-3L via [place],  only uses portable if going outside when every hot which is not much   Diverticulosis of colon     DOE (dyspnea on exertion)      02-27-2023  per pt daugter sob w/ stairs, incline's,  ok w/ some yard work if not warm/ hot wearther,  can do house hold chores   ED (erectile dysfunction)     Emphysema/COPD (HCC)      followed by pcp;   uses breztri  bid, uses preventil nebulizer every afternoon;   last  exacerbation 06/ 2023 in epic   Full dentures     GERD (gastroesophageal reflux disease)     Hepatic steatosis     History of adenomatous polyp of colon     History of gastric ulcer      per EGD 09-16-2020  non-bleeding   History of MI (myocardial infarction)      per nuclear stress test in epic 11-02-2019 showed prior inferior defect of severe severity indicative of previous MI   History of pelvic fracture      1999  and 2009   Hyperlipidemia     Hypertension     Smokers' cough (HCC)     Type 2 diabetes mellitus treated with insulin  (HCC)      followed by pcp;  dx 2016   (02-27-2023  per pt daughter pt   Wears glasses               Family History  Problem Relation Age of Onset   Emphysema Mother     Diabetes Mother     Emphysema Father     Cancer Sister          Stomach cancer   Diabetes Sister     Stomach cancer Sister     Diabetes Brother     Colon cancer Neg Hx     Colon polyps Neg Hx     Esophageal cancer Neg Hx     Rectal cancer Neg Hx               Past Surgical History:  Procedure Laterality Date   BIOPSY   09/16/2020    Procedure: BIOPSY;  Surgeon: Eda Iha, MD;  Location: [facility];  Service: Gastroenterology;;   CHOLECYSTECTOMY N/A 03/20/2022    Procedure: LAPAROSCOPIC CHOLECYSTECTOMY;  Surgeon: Eletha Boas, MD;  Location: [facility];  Service: General;  Laterality: N/A;   COLONOSCOPY WITH PROPOFOL    05/10/2018    dr leigh   ESOPHAGOGASTRODUODENOSCOPY N/A 09/23/2015    Procedure: ESOPHAGOGASTRODUODENOSCOPY (EGD);  Surgeon: Gwendlyn ONEIDA Buddy, MD;  Location: [facility];  Service: Endoscopy;  Laterality: N/A;   ESOPHAGOGASTRODUODENOSCOPY (EGD) WITH PROPOFOL  N/A 09/16/2020    Procedure: ESOPHAGOGASTRODUODENOSCOPY (EGD) WITH PROPOFOL ;  Surgeon: Eda Iha, MD;  Location: [facility];  Service: Gastroenterology;  Laterality: N/A;   EXCISION MASS NECK Left 03/09/2023    Procedure: EXCISION SOFT TISSUE MASS LEFT POSTERIOR NECK;  Surgeon:  Eletha Boas, MD;  Location: [facility];  Service: General;  Laterality: Left;   FOOT SURGERY Left 1999   INGUINAL HERNIA REPAIR Left 05/15/2018    Procedure: LEFT INGUINAL HERNIA REPAIR WITH MESH;  Surgeon: Vanderbilt Ned, MD;  Location: [facility];  Service: General;  Laterality: Left;   INSERTION OF MESH Left 05/15/2018    Procedure: INSERTION OF MESH;  Surgeon: Vanderbilt Ned, MD;  Location: [facility];  Service: General;  Laterality: Left;   INTRAOPERATIVE CHOLANGIOGRAM N/A 03/20/2022    Procedure: INTRAOPERATIVE CHOLANGIOGRAM;  Surgeon:  Eletha Boas, MD;  Location: [facility];  Service: General;  Laterality: N/A;   REVERSE SHOULDER ARTHROPLASTY Left 08/30/2022    Procedure: LEFT REVERSE SHOULDER ARTHROPLASTY;  Surgeon: Addie Cordella Hamilton, MD;  Location: [facility];  Service: Orthopedics;  Laterality: Left;   SHOULDER ARTHROSCOPY W/ ROTATOR CUFF REPAIR Left      yrs ago        Social History         Occupational History   Occupation: Employed      Employer: [employer]  Tobacco Use   Smoking status: Every Day      Current packs/day: 0.15      Average packs/day: 0.2 packs/day for 53.9 years (8.1 ttl pk-yrs)      Types: Cigarettes      Start date: 1971   Smokeless tobacco: Never   Tobacco comments:      02-27-2023  per pt daughter pt is still smoking, but hides how much per day,  smoking since age 2 (33)  Vaping Use   Vaping status: Never Used  Substance and Sexual Activity   Alcohol use: Yes      Alcohol/week: 2.0 standard drinks of alcohol      Types: 2 Cans of beer per week      Comment: 2 beers per week   Drug use: Never   Sexual activity: Yes                    KANDICE Hamilton Addie, MD 08/08/2023, 11:00 AM

## 2023-08-08 NOTE — Anesthesia Procedure Notes (Addendum)
Anesthesia Regional Block: Interscalene brachial plexus block   Pre-Anesthetic Checklist: , timeout performed,  Correct Patient, Correct Site, Correct Laterality,  Correct Procedure, Correct Position, site marked,  Risks and benefits discussed,  Surgical consent,  Pre-op evaluation,  At surgeon's request and post-op pain management  Laterality: Right and Upper  Prep: chloraprep       Needles:  Injection technique: Single-shot      Needle Length: 5cm  Needle Gauge: 22     Additional Needles: Arrow StimuQuik ECHO Echogenic Stimulating PNB Needle  Procedures:,,,, ultrasound used (permanent image in chart),,    Narrative:  Start time: 08/08/2023 12:10 PM End time: 08/08/2023 12:20 PM Injection made incrementally with aspirations every 5 mL.  Performed by: Personally  Anesthesiologist: Val Eagle, MD

## 2023-08-08 NOTE — Anesthesia Procedure Notes (Signed)
 Procedure Name: Intubation Date/Time: 08/08/2023 12:36 PM  Performed by: Jolynn Mage, CRNAPre-anesthesia Checklist: Patient identified, Patient being monitored, Timeout performed, Emergency Drugs available and Suction available Patient Re-evaluated:Patient Re-evaluated prior to induction Oxygen  Delivery Method: Circle System Utilized Preoxygenation: Pre-oxygenation with 100% oxygen  Induction Type: IV induction Ventilation: Mask ventilation without difficulty Laryngoscope Size: Miller and 2 Grade View: Grade I Tube type: Oral Tube size: 7.5 mm Number of attempts: 1 Airway Equipment and Method: Stylet Placement Confirmation: ETT inserted through vocal cords under direct vision, positive ETCO2 and breath sounds checked- equal and bilateral Secured at: 23 cm Tube secured with: Tape Dental Injury: Teeth and Oropharynx as per pre-operative assessment

## 2023-08-08 NOTE — Op Note (Signed)
 NAME: Duane Cuevas, Duane Cuevas MEDICAL RECORD NO: [id number] ACCOUNT NO: [id number] DATE OF BIRTH: 1955/08/01 FACILITY: [facility] LOCATION: [facility] PHYSICIAN: Cordella RAMAN. Addie, MD  Operative Report   DATE OF PROCEDURE: 08/08/2023  PREOPERATIVE DIAGNOSES: 1.  Right shoulder rotator cuff arthropathy. 2.  Biceps tendinitis.  POSTOPERATIVE DIAGNOSES: 1.  Right shoulder rotator cuff arthropathy. 2.  Biceps tendinitis.  PROCEDURE:  Right reverse shoulder replacement and biceps tenodesis.  SURGEON:  Cordella RAMAN. Addie, MD  ASSISTANT:  Herlene Calix.  COMPONENTS UTILIZED: Include Biomet reverse shoulder replacement with large augmented baseplate, 36+6 glenosphere, 12 x 83 mini humeral stem, +6 tapered mini humeral tray with 36 standard bearing.  INDICATIONS:  This is a 68 year old patient with end-stage right shoulder arthritis and rotator cuff arthropathy who presents for operative management after explanation of the risks and benefits, did well with left reverse shoulder replacement.  DESCRIPTION OF PROCEDURE:  The patient was brought to the operating room where general endotracheal anesthesia was induced.  Preop antibiotics were administered.  Timeout was called.  Right shoulder, arm, and hand prescrubbed with hydrogen peroxide  followed by alcohol and then Betadine , which was allowed to air dry.  I then prepped with ChloraPrep solution and draped in a sterile manner.  Ioban used to cover and seal the operative field.  Timeout was called.  The patient's head was in neutral  position in the beach chair position.  Deltopectoral approach was made.  IrriSept solution was utilized.  The patient did not have a cephalic vein.  Crossing veins were ligated.  Deltoid was elevated manually off its anterior attachment.  Subdeltoid and  subacromial adhesions were released manually bluntly.  Next, the axillary nerve was palpated and protected at all times during the case.  The upper 1.5 cm of the pectoralis was released.   The biceps tendon was then tenodesed to the pec tendon as well as  surrounding tissues proximally.  This was done with 5-0 Vicryl sutures.  Circumflex vessels were ligated.  The subscapularis was tenuous, but intact and it was detached from the lesser tuberosity using a 15 blade.  Capsular dissection was then continued  down about 2 cm inferior to the humeral neck around to the 5 o'clock position on the humerus.  The posterosuperior rotator cuff was torn and retracted.  At this time, the head was dislocated.  Proximal reaming was performed up to a size 12.  The head was  then cut in 30 degrees of retroversion.  Broaching performed up to a size 12 with good bone quality encountered.  Cap was placed.  Next, posterior and anterior retractors were placed on the glenoid.  With care being taken to avoid injury to the axillary  nerve, circumferential excision of the labrum was performed with the electrocautery and four twitches under anesthesia.  Next, a Bankart lesion was created from the 12 o'clock to 6 o'clock position on the right shoulder.  The patient specific  instrumentation guide was utilized on the patient's native glenoid to obtain optimal position on the guide pin.  Reaming was then performed including standard reaming as well as reaming for the augment.  100% contact was achieved.  The baseplate was then  placed and one central compression screw and four peripheral locking screws were placed.  Excellent fixation was achieved.  Next, we did reduction maneuvers with various combinations.  The most stable combination was the +6 glenosphere set on neutral  offset along with +6 humeral tray and standard liner.  This gave very stable construct  to extension and adduction as well as with range of motion.  Axillary nerve not too tight.  At this time, the construct was difficult to dislocate and difficult to  relocate, which indicates good level of stability in his rotator cuff arthropathy of the shoulder.  The  shoulder was dislocated.  The trial stem was removed.  Thorough irrigation was performed with IrriSept solution.  Six SutureTapes were placed through  the lesser tuberosity through drill holes.  Next, we removed the trial glenosphere and placed the true glenosphere onto the dried [facility] taper.  Next, we placed the stem, which obtained very good fit.  Repeat trial reduction with the +6 tray and standard  liner gave same stability parameters.  The true component was placed onto the Dry Morse taper on the humeral side and reduction performed.  Excellent range of motion without instability was achieved.  Thorough irrigation was then performed with 3 liters  of irrigating solution.  Subscapularis was then repaired to the lesser tuberosity with the arm in 30 degrees of external rotation using the six SutureTapes.  Good stability was achieved.  We were able to achieve about 60 to 65 degrees of external  rotation after subscapularis repair.  Next, IrriSept solution was used to irrigate the implant.  Then we placed vancomycin  powder on the implant.  The deltopectoral interval was then closed using #1 Vicryl suture followed by interrupted inverted 0 Vicryl  suture, 2-0 Vicryl suture and 3-0 Monocryl with Steri-Strips, Aquacel dressing and shoulder sling applied.  The patient was transferred to the recovery room in stable condition.  Luke's assistance was required at all times for retraction, opening and closing mobilization of tissue.  His assistance was of medical necessity.   PUS D: 08/08/2023 3:03:11 pm T: 08/08/2023 6:34:00 pm  JOB: [id number]/ [id number]

## 2023-08-09 ENCOUNTER — Other Ambulatory Visit (HOSPITAL_COMMUNITY): Payer: Self-pay

## 2023-08-09 ENCOUNTER — Encounter (HOSPITAL_COMMUNITY): Payer: Self-pay | Admitting: Orthopedic Surgery

## 2023-08-09 DIAGNOSIS — M12811 Other specific arthropathies, not elsewhere classified, right shoulder: Secondary | ICD-10-CM | POA: Diagnosis not present

## 2023-08-09 LAB — GLUCOSE, CAPILLARY: Glucose-Capillary: 154 mg/dL — ABNORMAL HIGH (ref 70–99)

## 2023-08-09 MED ORDER — DOCUSATE SODIUM 100 MG PO CAPS
100.0000 mg | ORAL_CAPSULE | Freq: Two times a day (BID) | ORAL | 0 refills | Status: DC
  Filled 2023-08-09: qty 10, 5d supply, fill #0

## 2023-08-09 MED ORDER — MUPIROCIN 2 % EX OINT
1.0000 | TOPICAL_OINTMENT | Freq: Every day | CUTANEOUS | 0 refills | Status: AC
  Filled 2023-08-09: qty 22, 22d supply, fill #0

## 2023-08-09 MED ORDER — CELECOXIB 100 MG PO CAPS
100.0000 mg | ORAL_CAPSULE | Freq: Two times a day (BID) | ORAL | 0 refills | Status: DC
  Filled 2023-08-09: qty 60, 30d supply, fill #0

## 2023-08-09 MED ORDER — OXYCODONE HCL 5 MG PO TABS
5.0000 mg | ORAL_TABLET | ORAL | 0 refills | Status: DC | PRN
  Filled 2023-08-09: qty 30, 5d supply, fill #0

## 2023-08-09 NOTE — Discharge Summary (Signed)
 Physician Discharge Summary      Patient ID: Duane Cuevas MRN: [id number] DOB/AGE: 08-12-1955 68 y.o.  Admit date: 08/08/2023 Discharge date: 08/09/2023  Admission Diagnoses:  Principal Problem:   S/P reverse total shoulder arthroplasty, right   Discharge Diagnoses:  Same  Surgeries: Procedure(s): RIGHT REVERSE SHOULDER ARTHROPLASTY BICEPS TENODESIS on 08/08/2023   Consultants:   Discharged Condition: Stable  Hospital Course: Duane Cuevas is an 68 y.o. male who was admitted 08/08/2023 with a chief complaint of right shoulder pain, and found to have a diagnosis of right shoulder rotator cuff arthropathy.  They were brought to the operating room on 08/08/2023 and underwent the above named procedures.  Pt awoke from anesthesia without complication and was transferred to the floor. On POD1, patient's pain was overall controlled.  Block was still in effect.  He worked well with occupational therapy.  Ambulated without any dizziness or lightheadedness.  Discharged home on POD 1..  Pt will f/u with Dr. Addie or myself in clinic in ~2 weeks.   Antibiotics given:  Anti-infectives (From admission, onward)    Start     Dose/Rate Route Frequency Ordered Stop   08/08/23 2100  ceFAZolin  (ANCEF ) IVPB 2g/100 mL premix        2 g 200 mL/hr over 30 Minutes Intravenous Every 8 hours 08/08/23 1712 08/09/23 2159   08/08/23 1215  vancomycin  (VANCOCIN ) powder  Status:  Discontinued          As needed 08/08/23 1215 08/08/23 1456   08/08/23 0915  ceFAZolin  (ANCEF ) IVPB 2g/100 mL premix        2 g 200 mL/hr over 30 Minutes Intravenous On call to O.R. 08/08/23 0907 08/08/23 1318   08/08/23 0915  vancomycin  (VANCOCIN ) IVPB 1000 mg/200 mL premix        1,000 mg 200 mL/hr over 60 Minutes Intravenous On call to O.R. 08/08/23 0907 08/08/23 1125   08/08/23 0911  ceFAZolin  (ANCEF ) 2-4 GM/100ML-% IVPB       Note to Pharmacy: Larina Mays A: cabinet override      08/08/23 0911 08/08/23 1249      .  Recent vital signs:  Vitals:   08/09/23 0423 08/09/23 0734  BP: 108/73 117/76  Pulse: 84 92  Resp: 17 18  Temp: 97.8 F (36.6 C)   SpO2: 94% 92%    Recent laboratory studies:  Results for orders placed or performed during the hospital encounter of 08/08/23  Glucose, capillary   Collection Time: 08/08/23  9:22 AM  Result Value Ref Range   Glucose-Capillary 212 (H) 70 - 99 mg/dL   Comment 1 Notify RN    Comment 2 Document in Chart   Glucose, capillary   Collection Time: 08/08/23 12:03 PM  Result Value Ref Range   Glucose-Capillary 126 (H) 70 - 99 mg/dL   Comment 1 Notify RN    Comment 2 Document in Chart   Glucose, capillary   Collection Time: 08/08/23  3:03 PM  Result Value Ref Range   Glucose-Capillary 110 (H) 70 - 99 mg/dL  Glucose, capillary   Collection Time: 08/08/23  8:59 PM  Result Value Ref Range   Glucose-Capillary 324 (H) 70 - 99 mg/dL   Comment 1 Notify RN    Comment 2 Document in Chart   Glucose, capillary   Collection Time: 08/09/23  6:37 AM  Result Value Ref Range   Glucose-Capillary 154 (H) 70 - 99 mg/dL   Comment 1 Notify RN    Comment 2 Document in  Chart     Discharge Medications:   Allergies as of 08/09/2023   No Known Allergies      Medication List     STOP taking these medications    HYDROcodone -acetaminophen  5-325 MG tablet Commonly known as: NORCO/VICODIN   ibuprofen  600 MG tablet Commonly known as: ADVIL        TAKE these medications    albuterol  108 (90 Base) MCG/ACT inhaler Commonly known as: VENTOLIN  HFA INHALAR 2 BOCANADAS POR VIA ORAL CADA 6 HORAS SEGUN SEA NECESARIO PARA SIBILANCIAS O FALTA DE AIRE   albuterol  (2.5 MG/3ML) 0.083% nebulizer solution Commonly known as: PROVENTIL  INHALAR 1 FRASCO VIA NEBULIZADOR CADA 6 HORAS SEGUN SEA NECESARIO PARA SIBILANCIAS O FALTA DE AIRE   amLODipine  5 MG tablet Commonly known as: NORVASC  TOMAR 1 TABLETA POR VAI ORAL UNA VEZ AL DIA   aspirin  EC 81 MG tablet Take 1 tablet  (81 mg total) by mouth daily. Swallow whole.   atorvastatin  20 MG tablet Commonly known as: LIPITOR TOMAR 1 TABLETA POR VIA ORAL UNA VEZ AL DIA   B-D ULTRAFINE III SHORT PEN 31G X 8 MM Misc Generic drug: Insulin  Pen Needle USAR PARA INYECTAR INSULINA   B-D ULTRAFINE III SHORT PEN 31G X 8 MM Misc Generic drug: Insulin  Pen Needle USAR PARA INYECTAR INSULINA   blood glucose meter kit and supplies Dispense based on patient and insurance preference. Use up to four times daily as directed. (FOR ICD-10 E10.9, E11.9).   blood glucose meter kit and supplies Kit Dispense based on patient and insurance preference. Use up to four times daily as directed.   Breztri  Aerosphere 160-9-4.8 MCG/ACT Aero Generic drug: Budeson-Glycopyrrol-Formoterol  INHALAR 2 BOCANADAS POR VIA ORAL DOS VECES AL DIA   celecoxib  100 MG capsule Commonly known as: CELEBREX  Take 1 capsule (100 mg total) by mouth 2 (two) times daily.   docusate sodium  100 MG capsule Commonly known as: COLACE Take 1 capsule (100 mg total) by mouth 2 (two) times daily.   Farxiga  10 MG Tabs tablet Generic drug: dapagliflozin  propanediol TOMAR 1 TABLETA POR VIA ORAL UNA VEZ AL DIA   gabapentin  300 MG capsule Commonly known as: Neurontin  Take 1 capsule (300 mg total) by mouth 2 (two) times daily.   glipiZIDE  10 MG tablet Commonly known as: GLUCOTROL  TOMAR 1 TABLETA POR VIA ORAL UNA VEZ AL DIA ANTES DEL DESAYUNO *TOMAR 1 TABLETA ADICIONAL SI AZUCAR EN LA SANGRE ESTA ALTA*   methocarbamol  500 MG tablet Commonly known as: ROBAXIN  Take 1 tablet (500 mg total) by mouth 2 (two) times daily.   mupirocin  ointment 2 % Commonly known as: BACTROBAN  Apply 1 Application topically daily. 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  Follow discharge instructions on paperwork for duration   nicotine  14 mg/24hr patch Commonly known as: Nicoderm CQ  Place 1 patch (14 mg total) onto the skin daily.   NovoLOG  FlexPen 100 UNIT/ML  FlexPen Generic drug: insulin  aspart INYECTAR 3 UNIDADES POR VIA SUBCUTANEA TRES VECES AL DIA COMO INDICADO. AJUSTAR CANTIDAD DE INSULINA POR ESCALA MOVIL. MAS DOSIS 30 UNIDADES   ONE TOUCH ULTRA 2 w/Device Kit Use as directed to check blood glucose up to 2 times a day   OneTouch Ultra test strip Generic drug: glucose blood USE TO CHECK BLOOD SUGAR AS INSTRUCTED   oxyCODONE  5 MG immediate release tablet Commonly known as: Oxy IR/ROXICODONE  Take 1 tablet (5 mg total) by mouth every 4 (four) hours as needed for moderate pain (pain score 4-6) (pain  score 4-6).   pantoprazole  40 MG tablet Commonly known as: PROTONIX  TOMAR 1 TABLETA POR VIA ORAL UNA VEZ AL DIA   sildenafil  100 MG tablet Commonly known as: Viagra  Take 0.5-1 tablets (50-100 mg total) by mouth daily as needed for erectile dysfunction.   Tresiba  FlexTouch 100 UNIT/ML FlexTouch Pen Generic drug: insulin  degludec INYECTAR 25 UNIDADES POR VIA SUBCUTANEA DIARIAMENTE What changed: See the new instructions.   Trulicity  1.5 MG/0.5ML Soaj Generic drug: Dulaglutide  INYECTAR CONTENIDOS DE UN LAPIZ POR VIA SUBCUTANEA CADA SEMANA, EL MISMO DIA CADA SEMANA   Ubrelvy  100 MG Tabs Generic drug: Ubrogepant  TOMAR 1 TALBETA POR VIA ORAL UNA VEZ AL DIA        Diagnostic Studies: DG Shoulder Right Port Result Date: 08/08/2023 CLINICAL DATA:  Status post first toe right shoulder arthroplasty. EXAM: RIGHT SHOULDER - 1 VIEW COMPARISON:  Right shoulder radiographs 04/14/2023 FINDINGS: Single frontal view of the right shoulder. Interval reverse right shoulder arthroplasty. No perihardware lucency is seen to indicate hardware failure or loosening on limited single view. Expected postoperative changes including subcutaneous and subacromial/subdeltoid bursal air. Mild acromioclavicular joint space narrowing and peripheral osteophytosis. The visualized portion of the right lung is unremarkable. No acute fracture or dislocation. IMPRESSION: Interval  reverse right shoulder arthroplasty without evidence of hardware failure. Electronically Signed   By: Tanda Lyons M.D.   On: 08/08/2023 16:32    Disposition: Discharge disposition: 01-Home or Self Care       Discharge Instructions     Call MD / Call 911   Complete by: As directed    If you experience chest pain or shortness of breath, CALL 911 and be transported to the hospital emergency room.  If you develope a fever above 101 F, pus (white drainage) or increased drainage or redness at the wound, or calf pain, call your surgeon's office.   Constipation Prevention   Complete by: As directed    Drink plenty of fluids.  Prune juice may be helpful.  You may use a stool softener, such as Colace (over the counter) 100 mg twice a day.  Use MiraLax  (over the counter) for constipation as needed.   Diet - low sodium heart healthy   Complete by: As directed    Discharge instructions   Complete by: As directed    You may shower, dressing is waterproof.  Do not bathe or soak the operative shoulder in a tub, pool.  Use the CPM machine 3 times a day for one hour each time.  No lifting with the operative shoulder. Continue use of the sling.  Follow-up with Dr. Addie in ~2 weeks on your given appointment date.  We will remove your adhesive bandage at that time.    Dental Antibiotics:  In most cases prophylactic antibiotics for Dental procdeures after total joint surgery are not necessary.  Exceptions are as follows:  1. History of prior total joint infection  2. Severely immunocompromised (Organ Transplant, cancer chemotherapy, Rheumatoid biologic meds such as Humera)  3. Poorly controlled diabetes (A1C &gt; 8.0, blood glucose over 200)  If you have one of these conditions, contact your surgeon for an antibiotic prescription, prior to your dental procedure.   Increase activity slowly as tolerated   Complete by: As directed    Post-operative opioid taper instructions:   Complete by: As  directed    POST-OPERATIVE OPIOID TAPER INSTRUCTIONS: It is important to wean off of your opioid medication as soon as possible. If you do not need pain  medication after your surgery it is ok to stop day one. Opioids include: Codeine , Hydrocodone (Norco, Vicodin), Oxycodone (Percocet, oxycontin ) and hydromorphone  amongst others.  Long term and even short term use of opiods can cause: Increased pain response Dependence Constipation Depression Respiratory depression And more.  Withdrawal symptoms can include Flu like symptoms Nausea, vomiting And more Techniques to manage these symptoms Hydrate well Eat regular healthy meals Stay active Use relaxation techniques(deep breathing, meditating, yoga) Do Not substitute Alcohol to help with tapering If you have been on opioids for less than two weeks and do not have pain than it is ok to stop all together.  Plan to wean off of opioids This plan should start within one week post op of your joint replacement. Maintain the same interval or time between taking each dose and first decrease the dose.  Cut the total daily intake of opioids by one tablet each day Next start to increase the time between doses. The last dose that should be eliminated is the evening dose.             SignedBETHA Herlene Calix 08/09/2023, 11:23 AM

## 2023-08-09 NOTE — Progress Notes (Signed)
 Patient alert and oriented, voiding adequately, skin clean, dry and intact without evidence of skin break down, or symptoms of complications - no redness or edema noted, only slight tenderness at site.  Patient states pain is manageable at time of discharge. Room was checked and accounted for all patient's belongings; discharge instructions concerning her medications, incision care, follow up appointment and when to call the doctor as needed were all discussed with patient by RN and he expressed understanding on the instructions given.

## 2023-08-09 NOTE — Progress Notes (Signed)
  Subjective: Duane Cuevas is a 68 y.o. male s/p right RSA.  They are POD 1.  Pt's pain is controlled.  Patient denies any complaints of chest pain, shortness of breath, abdominal pain.  Has been ambulatory without difficulty.  Working with occupational therapy currently.  Block still in effect.  Objective: Vital signs in last 24 hours: Temp:  [97.6 F (36.4 C)-98.6 F (37 C)] 97.8 F (36.6 C) (02/05 0423) Pulse Rate:  [81-97] 92 (02/05 0734) Resp:  [12-23] 18 (02/05 0734) BP: (97-134)/(69-89) 117/76 (02/05 0734) SpO2:  [92 %-100 %] 92 % (02/05 0734) Weight:  [77.1 kg] 77.1 kg (02/04 0914)  Intake/Output from previous day: 02/04 0701 - 02/05 0700 In: 950 [I.V.:500; IV Piggyback:450] Out: 50 [Blood:50] Intake/Output this shift: No intake/output data recorded.  Exam:  No gross blood or drainage overlying the dressing 2+ radial pulse of the operative extremity Postoperative physical exam limited by interscalene block.   Labs: No results for input(s): HGB in the last 72 hours. No results for input(s): WBC, RBC, HCT, PLT in the last 72 hours. No results for input(s): NA, K, CL, CO2, BUN, CREATININE, GLUCOSE, CALCIUM  in the last 72 hours. No results for input(s): LABPT, INR in the last 72 hours.  Assessment/Plan: Pt is POD 1 s/p right RSA    -Plan to discharge to home today pending patient's pain  -No lifting with the operative arm  -Stay in sling except for showering/sleeping and using CPM machine at home.  No lifting with the operative arm more than 1 to 2 pounds  -Follow-up with Dr. Addie in clinic 2 weeks postoperatively     [facility] 08/09/2023, 7:50 AM

## 2023-08-09 NOTE — Evaluation (Signed)
 Occupational Therapy Evaluation Patient Details Name: Duane Cuevas MRN: [id number] DOB: 1956/04/04 Today's Date: 08/09/2023   History of Present Illness Pt is a 19 male who presented due to R shoulder pain and s/p 08/08/23 R reverse shoulder arthroplasty. PMH significant for L shoulder rotator cuff repain, DM, COPD,  L reverse shoulder arthroplasty with biceps tenodesis and removal of retained hardware   Clinical Impression   Pt presented in bed and agreeable to session. Pt in session was educated about dressing, sling use and exercises they are allowed to complete at this time. Pt at this time was limited as nerve block limited any motor control at this time. He was able to show only some digital extension/flexion in  second digit of R hand. Pt was able to complete simulated dressing with mod-max assist with UE and CGA with LB ADLS. He then completed ascending and descending steps with CGA but needed cues with pacing self as de stated to 81% but was able to recover to 91% on RA. At this time plan to follow up with OP PT in follow up appointment.       If plan is discharge home, recommend the following: A lot of help with bathing/dressing/bathroom;Assistance with cooking/housework;Assist for transportation;Help with stairs or ramp for entrance    Functional Status Assessment  Patient has had a recent decline in their functional status and demonstrates the ability to make significant improvements in function in a reasonable and predictable amount of time.  Equipment Recommendations  None recommended by OT    Recommendations for Other Services       Precautions / Restrictions Precautions Precautions: Fall Restrictions Weight Bearing Restrictions Per Provider Order: Yes RUE Weight Bearing Per Provider Order: Non weight bearing      Mobility Bed Mobility Overal bed mobility: Needs Assistance Bed Mobility: Supine to Sit     Supine to sit: Supervision, HOB elevated     General  bed mobility comments: Pt reported they could sleep in recliner if needed    Transfers Overall transfer level: Needs assistance   Transfers: Sit to/from Stand Sit to Stand: Supervision                  Balance Overall balance assessment: No apparent balance deficits (not formally assessed)                                         ADL either performed or assessed with clinical judgement   ADL Overall ADL's : Needs assistance/impaired Eating/Feeding: Minimal assistance Eating/Feeding Details (indicate cue type and reason): set up of materials that need BUE Grooming: Wash/dry hands;Wash/dry face;Contact guard assist   Upper Body Bathing: Moderate assistance;Maximal assistance;Sitting   Lower Body Bathing: Contact guard assist;Sitting/lateral leans;Sit to/from stand   Upper Body Dressing : Moderate assistance;Maximal assistance;Sitting   Lower Body Dressing: Contact guard assist;Sit to/from stand   Toilet Transfer: Contact guard assist;Cueing for safety;Cueing for sequencing   Toileting- Clothing Manipulation and Hygiene: Contact guard assist;Sit to/from stand   Tub/ Shower Transfer: Contact guard assist;Cueing for safety;Cueing for sequencing   Functional mobility during ADLs: Contact guard assist       Vision Baseline Vision/History: 1 Wears glasses Ability to See in Adequate Light: 0 Adequate Patient Visual Report: No change from baseline Vision Assessment?: No apparent visual deficits     Perception Perception: Within Functional Limits       Praxis  Pertinent Vitals/Pain Pain Assessment Pain Assessment: No/denies pain (pt reported nerve block still in place)     Extremity/Trunk Assessment Upper Extremity Assessment Upper Extremity Assessment: RUE deficits/detail;Right hand dominant RUE Deficits / Details: s/p R reverse total shoulder and pt at this time still has nerve block in place and no sensation, at the end of sesion  slight extension in digit 2 RUE Coordination: decreased fine motor;decreased gross motor   Lower Extremity Assessment Lower Extremity Assessment: Overall WFL for tasks assessed   Cervical / Trunk Assessment Cervical / Trunk Assessment: Kyphotic   Communication Communication Communication: No apparent difficulties Cueing Techniques: Verbal cues   Cognition Arousal: Alert Behavior During Therapy: WFL for tasks assessed/performed Overall Cognitive Status: Within Functional Limits for tasks assessed                                       General Comments       Exercises Exercises:  (Reviewed with LUE on what they can complete with RUE as at this time unable to complete any AROM at RUE as nerve block has not worn off at this time.)   Shoulder Instructions      Home Living Family/patient expects to be discharged to:: Private residence Living Arrangements: Spouse/significant other Available Help at Discharge: Family;Available 24 hours/day Type of Home: House Home Access: Stairs to enter [employer] of Steps: 10 Entrance Stairs-Rails: Right       Bathroom Shower/Tub: Producer, Television/film/video: Standard Bathroom Accessibility: Yes   Home Equipment: Shower seat          Prior Functioning/Environment Prior Level of Function : Independent/Modified Independent             Mobility Comments: no assist ADLs Comments: Pt reports indpendent        OT Problem List: Decreased strength;Decreased activity tolerance;Impaired balance (sitting and/or standing);Decreased safety awareness;Decreased knowledge of use of DME or AE;Cardiopulmonary status limiting activity;Pain      OT Treatment/Interventions: Self-care/ADL training;Therapeutic exercise;Therapeutic activities;Patient/family education;Balance training    OT Goals(Current goals can be found in the care plan section) Acute Rehab OT Goals Patient Stated Goal: to go home soon OT Goal  Formulation: With patient Time For Goal Achievement: 08/23/23 Potential to Achieve Goals: Good  OT Frequency: Min 1X/week    Co-evaluation              AM-PAC OT 6 Clicks Daily Activity     Outcome Measure Help from another person eating meals?: A Little Help from another person taking care of personal grooming?: A Little Help from another person toileting, which includes using toliet, bedpan, or urinal?: A Little Help from another person bathing (including washing, rinsing, drying)?: A Lot Help from another person to put on and taking off regular upper body clothing?: A Lot Help from another person to put on and taking off regular lower body clothing?: A Little 6 Click Score: 16   End of Session Equipment Utilized During Treatment: Gait belt Nurse Communication: Mobility status  Activity Tolerance: Patient tolerated treatment well Patient left: in bed;with call bell/phone within reach  OT Visit Diagnosis: Muscle weakness (generalized) (M62.81);Unsteadiness on feet (R26.81);Other abnormalities of gait and mobility (R26.89);Repeated falls (R29.6)                Time: 9268-9195 OT Time Calculation (min): 33 min Charges:  OT General Charges $OT Visit: 1 Visit OT Evaluation $  OT Eval Low Complexity: 1 Low OT Treatments $Self Care/Home Management : 8-22 mins  Warrick POUR OTR/L  Acute Rehab Services  [phone] office number   Warrick Berber 08/09/2023, 8:20 AM

## 2023-08-09 NOTE — Anesthesia Postprocedure Evaluation (Signed)
 Anesthesia Post Note  Patient: Duane Cuevas  Procedure(s) Performed: RIGHT REVERSE SHOULDER ARTHROPLASTY (Right: Shoulder) BICEPS TENODESIS (Right)     Patient location during evaluation: PACU Anesthesia Type: Regional and General Level of consciousness: awake and alert Pain management: pain level controlled Vital Signs Assessment: post-procedure vital signs reviewed and stable Respiratory status: spontaneous breathing, nonlabored ventilation, respiratory function stable and patient connected to nasal cannula oxygen  Cardiovascular status: blood pressure returned to baseline and stable Postop Assessment: no apparent nausea or vomiting Anesthetic complications: no  No notable events documented.  Last Vitals:  Vitals:   08/09/23 0423 08/09/23 0734  BP: 108/73 117/76  Pulse: 84 92  Resp: 17 18  Temp: 36.6 C   SpO2: 94% 92%    Last Pain:  Vitals:   08/09/23 0706  TempSrc:   PainSc: 2    Pain Goal: Patients Stated Pain Goal: 2 (08/09/23 0621)                 Marien L Karston Hyland

## 2023-08-09 NOTE — Plan of Care (Signed)
  Problem: Education: Goal: Knowledge of General Education information will improve Description: Including pain rating scale, medication(s)/side effects and non-pharmacologic comfort measures Outcome: Completed/Met   Problem: Health Behavior/Discharge Planning: Goal: Ability to manage health-related needs will improve Outcome: Completed/Met   Problem: Clinical Measurements: Goal: Ability to maintain clinical measurements within normal limits will improve Outcome: Completed/Met Goal: Will remain free from infection Outcome: Completed/Met Goal: Diagnostic test results will improve Outcome: Completed/Met Goal: Respiratory complications will improve Outcome: Completed/Met Goal: Cardiovascular complication will be avoided Outcome: Completed/Met   Problem: Activity: Goal: Risk for activity intolerance will decrease Outcome: Completed/Met   Problem: Nutrition: Goal: Adequate nutrition will be maintained Outcome: Completed/Met   Problem: Coping: Goal: Level of anxiety will decrease Outcome: Completed/Met   Problem: Elimination: Goal: Will not experience complications related to bowel motility Outcome: Completed/Met Goal: Will not experience complications related to urinary retention Outcome: Completed/Met   Problem: Pain Managment: Goal: General experience of comfort will improve and/or be controlled Outcome: Completed/Met   Problem: Safety: Goal: Ability to remain free from injury will improve Outcome: Completed/Met   Problem: Skin Integrity: Goal: Risk for impaired skin integrity will decrease Outcome: Completed/Met   Problem: Education: Goal: Ability to describe self-care measures that may prevent or decrease complications (Diabetes Survival Skills Education) will improve Outcome: Completed/Met Goal: Individualized Educational Video(s) Outcome: Completed/Met   Problem: Coping: Goal: Ability to adjust to condition or change in health will improve Outcome:  Completed/Met   Problem: Fluid Volume: Goal: Ability to maintain a balanced intake and output will improve Outcome: Completed/Met   Problem: Health Behavior/Discharge Planning: Goal: Ability to identify and utilize available resources and services will improve Outcome: Completed/Met Goal: Ability to manage health-related needs will improve Outcome: Completed/Met   Problem: Metabolic: Goal: Ability to maintain appropriate glucose levels will improve Outcome: Completed/Met   Problem: Nutritional: Goal: Maintenance of adequate nutrition will improve Outcome: Completed/Met Goal: Progress toward achieving an optimal weight will improve Outcome: Completed/Met   Problem: Skin Integrity: Goal: Risk for impaired skin integrity will decrease Outcome: Completed/Met   Problem: Tissue Perfusion: Goal: Adequacy of tissue perfusion will improve Outcome: Completed/Met   Problem: Education: Goal: Knowledge of the prescribed therapeutic regimen will improve Outcome: Completed/Met Goal: Understanding of activity limitations/precautions following surgery will improve Outcome: Completed/Met Goal: Individualized Educational Video(s) Outcome: Completed/Met   Problem: Activity: Goal: Ability to tolerate increased activity will improve Outcome: Completed/Met   Problem: Pain Management: Goal: Pain level will decrease with appropriate interventions Outcome: Completed/Met

## 2023-08-10 ENCOUNTER — Telehealth: Payer: Self-pay

## 2023-08-10 NOTE — Transitions of Care (Post Inpatient/ED Visit) (Signed)
   08/10/2023  Name: Duane Cuevas MRN: [id number] DOB: April 08, 1956  Today's TOC FU Call Status: Today's TOC FU Call Status:: Successful TOC FU Call Completed TOC FU Call Complete Date: 08/10/23 Patient's Name and Date of Birth confirmed.  Transition Care Management Follow-up Telephone Call Date of Discharge: 08/09/23 Discharge Facility: [facility]) Type of Discharge: Emergency Department Reason for ED Visit: Other: (pre-op) How have you been since you were released from the hospital?: Same Any questions or concerns?: No  Items Reviewed:    Medications Reviewed Today: Medications Reviewed Today   Medications were not reviewed in this encounter     Home Care and Equipment/Supplies:    Functional Questionnaire:    Follow up appointments reviewed:   Pre-op   SIGNATURE Julian Lemmings, LPN [facility] Nurse Health Advisor Direct Dial  [phone]

## 2023-08-11 NOTE — Addendum Note (Signed)
 Addendum  created 08/11/23 0927 by Arvie Latus, MD   Attestation recorded in Intraprocedure, Intraprocedure Attestations filed

## 2023-08-16 ENCOUNTER — Telehealth: Payer: Self-pay | Admitting: *Deleted

## 2023-08-16 ENCOUNTER — Other Ambulatory Visit: Payer: Self-pay | Admitting: *Deleted

## 2023-08-16 DIAGNOSIS — Z96611 Presence of right artificial shoulder joint: Secondary | ICD-10-CM

## 2023-08-16 NOTE — Telephone Encounter (Signed)
We can do norco or Tramadol instead, whatever they prefer

## 2023-08-16 NOTE — Telephone Encounter (Signed)
Spoke with patient's daughter today to ask how patient doing. She states Oxycodone seems too strong for him. Asked if there was anything that could be taken for pain. Pharmacy on chart. Thank you.

## 2023-08-17 ENCOUNTER — Telehealth: Payer: Self-pay | Admitting: Pharmacy Technician

## 2023-08-17 ENCOUNTER — Other Ambulatory Visit (HOSPITAL_COMMUNITY): Payer: Self-pay

## 2023-08-17 ENCOUNTER — Other Ambulatory Visit: Payer: Self-pay | Admitting: Surgical

## 2023-08-17 ENCOUNTER — Encounter: Payer: Self-pay | Admitting: Surgical

## 2023-08-17 DIAGNOSIS — M1990 Unspecified osteoarthritis, unspecified site: Secondary | ICD-10-CM | POA: Diagnosis not present

## 2023-08-17 DIAGNOSIS — E1142 Type 2 diabetes mellitus with diabetic polyneuropathy: Secondary | ICD-10-CM | POA: Diagnosis not present

## 2023-08-17 DIAGNOSIS — I700 Atherosclerosis of aorta: Secondary | ICD-10-CM | POA: Diagnosis not present

## 2023-08-17 DIAGNOSIS — I774 Celiac artery compression syndrome: Secondary | ICD-10-CM | POA: Diagnosis not present

## 2023-08-17 DIAGNOSIS — E1165 Type 2 diabetes mellitus with hyperglycemia: Secondary | ICD-10-CM | POA: Diagnosis not present

## 2023-08-17 DIAGNOSIS — Z7982 Long term (current) use of aspirin: Secondary | ICD-10-CM | POA: Diagnosis not present

## 2023-08-17 DIAGNOSIS — Z008 Encounter for other general examination: Secondary | ICD-10-CM | POA: Diagnosis not present

## 2023-08-17 DIAGNOSIS — E785 Hyperlipidemia, unspecified: Secondary | ICD-10-CM | POA: Diagnosis not present

## 2023-08-17 DIAGNOSIS — I110 Hypertensive heart disease with heart failure: Secondary | ICD-10-CM | POA: Diagnosis not present

## 2023-08-17 DIAGNOSIS — Z8249 Family history of ischemic heart disease and other diseases of the circulatory system: Secondary | ICD-10-CM | POA: Diagnosis not present

## 2023-08-17 DIAGNOSIS — Z833 Family history of diabetes mellitus: Secondary | ICD-10-CM | POA: Diagnosis not present

## 2023-08-17 DIAGNOSIS — I509 Heart failure, unspecified: Secondary | ICD-10-CM | POA: Diagnosis not present

## 2023-08-17 DIAGNOSIS — J439 Emphysema, unspecified: Secondary | ICD-10-CM | POA: Diagnosis not present

## 2023-08-17 MED ORDER — HYDROCODONE-ACETAMINOPHEN 5-325 MG PO TABS: 1.0000 | ORAL_TABLET | Freq: Four times a day (QID) | ORAL | 0 refills | Status: DC | PRN

## 2023-08-17 NOTE — Telephone Encounter (Signed)
Pharmacy Patient Advocate Encounter   Received notification from CoverMyMeds that prior authorization for Tresiba Flextouch 100 UNIT/ML PEN is required/requested.   Insurance verification completed.   The patient is insured through [facility] .   Per test claim: The current 60 day co-pay is, $0.00.  No PA needed at this time. This test claim was processed through [facility]- copay amounts may vary at other pharmacies due to pharmacy/plan contracts, or as the patient moves through the different stages of their insurance plan.     **ALSO SPOKE TO PHARMACY AND THEY RECEIVED A $0.00 COPAY.**

## 2023-08-17 NOTE — Telephone Encounter (Signed)
Sent in norco

## 2023-08-20 DIAGNOSIS — M19011 Primary osteoarthritis, right shoulder: Secondary | ICD-10-CM

## 2023-08-25 ENCOUNTER — Encounter: Payer: Self-pay | Admitting: Surgical

## 2023-08-25 ENCOUNTER — Ambulatory Visit: Payer: Medicare HMO | Admitting: Surgical

## 2023-08-25 ENCOUNTER — Other Ambulatory Visit (INDEPENDENT_AMBULATORY_CARE_PROVIDER_SITE_OTHER): Payer: Medicare HMO

## 2023-08-25 DIAGNOSIS — Z96611 Presence of right artificial shoulder joint: Secondary | ICD-10-CM

## 2023-08-25 MED ORDER — IBUPROFEN 800 MG PO TABS: 800.0000 mg | ORAL_TABLET | Freq: Three times a day (TID) | ORAL | 0 refills | Status: AC | PRN

## 2023-08-25 NOTE — Progress Notes (Signed)
 Post-Op Visit Note   Patient: Duane Cuevas           Date of Birth: 12-Feb-1956           MRN: [id number] Visit Date: 08/25/2023 PCP: Georgina Quint, MD   Assessment & Plan:  Chief Complaint:  Chief Complaint  Patient presents with   Right Shoulder - Routine Post Op    08/08/23 right RSA, BT   Visit Diagnoses:  1. History of arthroplasty of right shoulder     Plan: Tavari Loadholt is a 68 y.o. male who presents s/p right reverse shoulder arthroplasty on 08/08/2023.  Patient is doing well and pain is overall controlled.  Using CPM machine as instructed.  Denies any chest pain, SOB, fevers, chills.  No complaint of any instability symptoms.  Pain is controlled.  Feels about 40% improved compared with time of surgery..    On exam, patient has range of motion 30 degrees X rotation, 90 degrees abduction, 120 degrees forward elevation passively.  Intact EPL, FPL, finger abduction, finger adduction, pronation/supination, bicep, tricep, deltoid of operative extremity.  Axillary nerve intact with deltoid firing.  Incision is healing well without evidence of infection or dehiscence.  Incision was made sure to be covered with Steri-Strips from the proximal to distal aspect of the length of the incision.  2+ radial pulse of the operative extremity  Plan is discontinue sling.  Okay to very lightly lift with the operative extremity but no lifting anything heavier than a coffee cup or cell phone.  Start physical therapy to focus on passive range of motion and active range of motion with deltoid isometrics.  Do not want to externally rotate past 30 degrees to protect subscapularis repair.  Follow-up in 4 weeks for clinical recheck with Dr. August Saucer.  Follow-Up Instructions: Return in about 4 weeks (around 09/22/2023).   Orders:  Orders Placed This Encounter  Procedures   XR Shoulder Right   Meds ordered this encounter  Medications   ibuprofen (ADVIL) 800 MG tablet    Sig: Take 1 tablet  (800 mg total) by mouth every 8 (eight) hours as needed.    Dispense:  30 tablet    Refill:  0    Imaging: XR Shoulder Right Result Date: 08/25/2023 AP, scapular Y, axillary views of the shoulder reviewed.  Reverse shoulder arthroplasty prosthesis in good position and alignment without any complicating features.  There is no evidence of periprosthetic fracture, dislocation, dissociation of the glenosphere.    PMFS History: Patient Active Problem List   Diagnosis Date Noted   Arthritis of right shoulder region 08/20/2023   S/P reverse total shoulder arthroplasty, right 08/08/2023   Hematoma of right shoulder 04/17/2023   Contusion of right shoulder 04/17/2023   Rotator cuff arthropathy, left 08/31/2022   S/P reverse total shoulder arthroplasty, left 08/30/2022   Intractable persistent migraine aura without cerebral infarction and with status migrainosus 05/26/2021   Hypertension associated with diabetes (HCC) 09/21/2020   Uncontrolled type 2 diabetes mellitus with hyperglycemia (HCC) 09/13/2020   CAD (coronary artery disease) 01/23/2020   HLD (hyperlipidemia) 11/02/2019   History of MI (myocardial infarction) 11/02/2019   COPD ? GOLD III/ active smoker 08/14/2018   COPD (chronic obstructive pulmonary disease) (HCC) 10/14/2014   Dyslipidemia associated with type 2 diabetes mellitus (HCC) 10/14/2014   Past Medical History:  Diagnosis Date   Cerebral aneurysm, nonruptured    followed by dr n. Conchita Paris Robbie Lis neurosurgery);  last MRI in epic 06-13-2022 stable 2 mm  left paraophthalmia ICA intracerebral aneurysm   Coronary artery disease    (pt admitted for chest pain ) nuclear stress test 11-02-2019  low risk, nuclear ef 58%,  prior inferior defect of severe severity indicative of previous MI;;   echo 04-28-2020 ef 60-65%, mild concentric LVH, RSVP 54.23mmHg;;   cardiac CTA 09-15-2020  unremarkable,  calcium score=zero   Dependence on nocturnal oxygen therapy    02-27-2023  per pt  daughter,  pt only uses at night w/ O2 sat ,2-3L via [place],  only uses portable if going outside when every hot which is not much   Diverticulosis of colon    DOE (dyspnea on exertion)    02-27-2023  per pt daugter sob w/ stairs, incline's,  ok w/ some yard work if not warm/ hot wearther,  can do house hold chores   ED (erectile dysfunction)    Emphysema/COPD (HCC)    followed by pcp;   uses breztri bid, uses preventil nebulizer every afternoon;   last exacerbation 06/ 2023 in epic   Full dentures    GERD (gastroesophageal reflux disease)    Hepatic steatosis    History of adenomatous polyp of colon    History of gastric ulcer    per EGD 09-16-2020  non-bleeding   History of MI (myocardial infarction)    per nuclear stress test in epic 11-02-2019 showed prior inferior defect of severe severity indicative of previous MI   History of pelvic fracture    1999  and 2009   Hyperlipidemia    Hypertension    Smokers' cough (HCC)    Type 2 diabetes mellitus treated with insulin (HCC)    followed by pcp;  dx 2016   (02-27-2023  per pt daughter pt   Wears glasses     Family History  Problem Relation Age of Onset   Emphysema Mother    Diabetes Mother    Emphysema Father    Cancer Sister        Stomach cancer   Diabetes Sister    Stomach cancer Sister    Diabetes Brother    Colon cancer Neg Hx    Colon polyps Neg Hx    Esophageal cancer Neg Hx    Rectal cancer Neg Hx     Past Surgical History:  Procedure Laterality Date   BICEPT TENODESIS Right 08/08/2023   Procedure: BICEPS TENODESIS;  Surgeon: Cammy Copa, MD;  Location: [facility] OR;  Service: Orthopedics;  Laterality: Right;   BIOPSY  09/16/2020   Procedure: BIOPSY;  Surgeon: Tressia Danas, MD;  Location: [facility];  Service: Gastroenterology;;   CHOLECYSTECTOMY N/A 03/20/2022   Procedure: LAPAROSCOPIC CHOLECYSTECTOMY;  Surgeon: Darnell Level, MD;  Location: [facility] ORS;  Service: General;  Laterality: N/A;   COLONOSCOPY WITH  PROPOFOL  05/10/2018   dr Adela Lank   ESOPHAGOGASTRODUODENOSCOPY N/A 09/23/2015   Procedure: ESOPHAGOGASTRODUODENOSCOPY (EGD);  Surgeon: Meryl Dare, MD;  Location: [facility];  Service: Endoscopy;  Laterality: N/A;   ESOPHAGOGASTRODUODENOSCOPY (EGD) WITH PROPOFOL N/A 09/16/2020   Procedure: ESOPHAGOGASTRODUODENOSCOPY (EGD) WITH PROPOFOL;  Surgeon: Tressia Danas, MD;  Location: [facility];  Service: Gastroenterology;  Laterality: N/A;   EXCISION MASS NECK Left 03/09/2023   Procedure: EXCISION SOFT TISSUE MASS LEFT POSTERIOR NECK;  Surgeon: Darnell Level, MD;  Location: [facility];  Service: General;  Laterality: Left;   FOOT SURGERY Left 1999   INGUINAL HERNIA REPAIR Left 05/15/2018   Procedure: LEFT INGUINAL HERNIA REPAIR WITH MESH;  Surgeon: Harriette Bouillon, MD;  Location:  [facility] OR;  Service: General;  Laterality: Left;   INSERTION OF MESH Left 05/15/2018   Procedure: INSERTION OF MESH;  Surgeon: Harriette Bouillon, MD;  Location: [facility] OR;  Service: General;  Laterality: Left;   INTRAOPERATIVE CHOLANGIOGRAM N/A 03/20/2022   Procedure: INTRAOPERATIVE CHOLANGIOGRAM;  Surgeon: Darnell Level, MD;  Location: [facility];  Service: General;  Laterality: N/A;   REVERSE SHOULDER ARTHROPLASTY Left 08/30/2022   Procedure: LEFT REVERSE SHOULDER ARTHROPLASTY;  Surgeon: Cammy Copa, MD;  Location: [facility] OR;  Service: Orthopedics;  Laterality: Left;   REVERSE SHOULDER ARTHROPLASTY Right 08/08/2023   Procedure: RIGHT REVERSE SHOULDER ARTHROPLASTY;  Surgeon: Cammy Copa, MD;  Location: [facility] OR;  Service: Orthopedics;  Laterality: Right;   SHOULDER ARTHROSCOPY W/ ROTATOR CUFF REPAIR Left    yrs ago   Social History   Occupational History   Occupation: Employed    Employer: [employer]  Tobacco Use   Smoking status: Every Day    Current packs/day: 0.15    Average packs/day: 0.2 packs/day for 54.1 years (8.1 ttl pk-yrs)    Types: Cigarettes    Start date: 1971   Smokeless tobacco: Never    Tobacco comments:    02-27-2023  per pt daughter pt is still smoking, but hides how much per day,  smoking since age 36 (42)  Vaping Use   Vaping status: Never Used  Substance and Sexual Activity   Alcohol use: Not Currently    Alcohol/week: 2.0 standard drinks of alcohol    Types: 2 Cans of beer per week    Comment: 2 beers per week   Drug use: Never   Sexual activity: Yes

## 2023-08-28 ENCOUNTER — Ambulatory Visit: Payer: Medicare HMO | Admitting: Orthopedic Surgery

## 2023-08-29 ENCOUNTER — Ambulatory Visit: Payer: Medicare HMO | Attending: Orthopedic Surgery

## 2023-08-29 DIAGNOSIS — R600 Localized edema: Secondary | ICD-10-CM | POA: Insufficient documentation

## 2023-08-29 DIAGNOSIS — M25511 Pain in right shoulder: Secondary | ICD-10-CM | POA: Diagnosis not present

## 2023-08-29 DIAGNOSIS — Z96611 Presence of right artificial shoulder joint: Secondary | ICD-10-CM | POA: Insufficient documentation

## 2023-08-29 DIAGNOSIS — M6281 Muscle weakness (generalized): Secondary | ICD-10-CM | POA: Insufficient documentation

## 2023-08-29 NOTE — Therapy (Incomplete)
 OUTPATIENT PHYSICAL THERAPY SHOULDER EVALUATION   Patient Name: [facility] MRN: [id number] DOB:[birth year], 68 y.o., male Today's Date: 08/30/2023  END OF SESSION:  PT End of Session - 08/30/23 0547     Visit Number 1    Number of Visits 17    Date for PT Re-Evaluation 11/03/23    Authorization Type [employer] MEDICARE HMO/PPO; MEDICAID OF [place]    PT Start Time 1145    PT Stop Time 1230    PT Time Calculation (min) 45 min    Activity Tolerance Patient limited by pain    Behavior During Therapy WFL for tasks assessed/performed             Past Medical History:  Diagnosis Date   Cerebral aneurysm, nonruptured    followed by dr n. 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   Coronary artery disease    (pt admitted for chest pain ) nuclear stress test 11-02-2019  low risk, nuclear ef 58%,  prior inferior defect of severe severity indicative of previous MI;;   echo 04-28-2020 ef 60-65%, mild concentric LVH, RSVP 54.18mmHg;;   cardiac CTA 09-15-2020  unremarkable,  calcium score=zero   Dependence on nocturnal oxygen therapy    02-27-2023  per pt daughter,  pt only uses at night w/ O2 sat ,2-3L via ,  only uses portable if going outside when every hot which is not much   Diverticulosis of colon    DOE (dyspnea on exertion)    02-27-2023  per pt daugter sob w/ stairs, incline's,  ok w/ some yard work if not warm/ hot wearther,  can do house hold chores   ED (erectile dysfunction)    Emphysema/COPD (HCC)    followed by pcp;   uses breztri bid, uses preventil nebulizer every afternoon;   last exacerbation 06/ 2023 in epic   Full dentures    GERD (gastroesophageal reflux disease)    Hepatic steatosis    History of adenomatous polyp of colon    History of gastric ulcer    per EGD 09-16-2020  non-bleeding   History of MI (myocardial infarction)    per nuclear stress test in epic 11-02-2019 showed prior  inferior defect of severe severity indicative of previous MI   History of pelvic fracture    1999  and 2009   Hyperlipidemia    Hypertension    Smokers' cough (HCC)    Type 2 diabetes mellitus treated with insulin (HCC)    followed by pcp;  dx 2016   (02-27-2023  per pt daughter pt   Wears glasses    Past Surgical History:  Procedure Laterality Date   BICEPT TENODESIS Right 08/08/2023   Procedure: BICEPS TENODESIS;  Surgeon: Cammy Copa, MD;  Location: [facility] OR;  Service: Orthopedics;  Laterality: Right;   BIOPSY  09/16/2020   Procedure: BIOPSY;  Surgeon: Tressia Danas, MD;  Location: [facility];  Service: Gastroenterology;;   CHOLECYSTECTOMY N/A 03/20/2022   Procedure: LAPAROSCOPIC CHOLECYSTECTOMY;  Surgeon: Darnell Level, MD;  Location: [facility];  Service: General;  Laterality: N/A;   COLONOSCOPY WITH PROPOFOL  05/10/2018   dr Adela Lank   ESOPHAGOGASTRODUODENOSCOPY N/A 09/23/2015   Procedure: ESOPHAGOGASTRODUODENOSCOPY (EGD);  Surgeon: Meryl Dare, MD;  Location: [facility];  Service: Endoscopy;  Laterality: N/A;   ESOPHAGOGASTRODUODENOSCOPY (EGD) WITH PROPOFOL N/A 09/16/2020   Procedure: ESOPHAGOGASTRODUODENOSCOPY (EGD) WITH PROPOFOL;  Surgeon: Tressia Danas, MD;  Location: [facility];  Service: Gastroenterology;  Laterality: N/A;   EXCISION MASS NECK Left 03/09/2023   Procedure: EXCISION SOFT TISSUE MASS LEFT POSTERIOR NECK;  Surgeon: Darnell Level, MD;  Location: [facility];  Service: General;  Laterality: Left;   FOOT SURGERY Left 1999   INGUINAL HERNIA REPAIR Left 05/15/2018   Procedure: LEFT INGUINAL HERNIA REPAIR WITH MESH;  Surgeon: Harriette Bouillon, MD;  Location: [facility];  Service: General;  Laterality: Left;   INSERTION OF MESH Left 05/15/2018   Procedure: INSERTION OF MESH;  Surgeon: Harriette Bouillon, MD;  Location: [facility];  Service: General;  Laterality: Left;   INTRAOPERATIVE CHOLANGIOGRAM N/A 03/20/2022   Procedure: INTRAOPERATIVE CHOLANGIOGRAM;  Surgeon:  Darnell Level, MD;  Location: [facility];  Service: General;  Laterality: N/A;   REVERSE SHOULDER ARTHROPLASTY Left 08/30/2022   Procedure: LEFT REVERSE SHOULDER ARTHROPLASTY;  Surgeon: Cammy Copa, MD;  Location: [facility];  Service: Orthopedics;  Laterality: Left;   REVERSE SHOULDER ARTHROPLASTY Right 08/08/2023   Procedure: RIGHT REVERSE SHOULDER ARTHROPLASTY;  Surgeon: Cammy Copa, MD;  Location: [facility] OR;  Service: Orthopedics;  Laterality: Right;   SHOULDER ARTHROSCOPY W/ ROTATOR CUFF REPAIR Left    yrs ago   Patient Active Problem List   Diagnosis Date Noted   Arthritis of right shoulder region 08/20/2023   S/P reverse total shoulder arthroplasty, right 08/08/2023   Hematoma of right shoulder 04/17/2023   Contusion of right shoulder 04/17/2023   Rotator cuff arthropathy, left 08/31/2022   S/P reverse total shoulder arthroplasty, left 08/30/2022   Intractable persistent migraine aura without cerebral infarction and with status migrainosus 05/26/2021   Hypertension associated with diabetes (HCC) 09/21/2020   Uncontrolled type 2 diabetes mellitus with hyperglycemia (HCC) 09/13/2020   CAD (coronary artery disease) 01/23/2020   HLD (hyperlipidemia) 11/02/2019   History of MI (myocardial infarction) 11/02/2019   COPD ? GOLD III/ active smoker 08/14/2018   COPD (chronic obstructive pulmonary disease) (HCC) 10/14/2014   Dyslipidemia associated with type 2 diabetes mellitus (HCC) 10/14/2014    PCP: Georgina Quint, MD   REFERRING PROVIDER: Cammy Copa, MD   REFERRING DIAG: [phone] (ICD-10-CM) - S/P reverse total shoulder arthroplasty, right on 08/08/23  THERAPY DIAG:  Right shoulder pain, unspecified chronicity  Muscle weakness  Localized edema  Rationale for Evaluation and Treatment: Rehabilitation  ONSET DATE: 08/08/23  SUBJECTIVE:                                                                                                                                                                                       SUBJECTIVE STATEMENT: Pt reports his R shoulder is doing about as expected after surgery.   Hand dominance: Right  08/25/23 Ortho-Care Visit Plan: [facility] is a 68 y.o. male who presents s/p right reverse shoulder arthroplasty on 08/08/2023.  Patient is doing well and pain is overall controlled.  Using CPM machine as instructed.  Denies any chest pain, SOB, fevers, chills.  No complaint of any instability symptoms.  Pain is controlled.  Feels about 40% improved compared with time of surgery..     On exam, patient has range of motion 30 degrees X rotation, 90 degrees abduction, 120 degrees forward elevation passively.  Intact EPL, FPL, finger abduction, finger adduction, pronation/supination, bicep, tricep, deltoid of operative extremity.  Axillary nerve intact with deltoid firing.  Incision is healing well without evidence of infection or dehiscence.  Incision was made sure to be covered with Steri-Strips from the proximal to distal aspect of the length of the incision.  2+ radial pulse of the operative extremity   Plan is discontinue sling.  Okay to very lightly lift with the operative extremity but no lifting anything heavier than a coffee cup or cell phone.  Start physical therapy to focus on passive range of motion and active range of motion with deltoid isometrics.  Do not want to externally rotate past 30 degrees to protect subscapularis repair.  Follow-up in 4 weeks for clinical recheck with Dr. August Saucer.  PERTINENT HISTORY: Significant for cardiopulmonary disease, Hx L RTSA, Smokes, DM2  PAIN:  Are you having pain? Yes: NPRS scale: 7/10 Pain location: R shoulder Pain description: Ache, sharp Aggravating factors: Lying in bed Relieving factors: Medications and cold pack  PRECAUTIONS: R RTSA 2/27 with subscap repair   RED FLAGS: None   WEIGHT BEARING RESTRICTIONS: Yes R RTSA 08/08/23  FALLS:  Has patient fallen in last 6 months?  No  LIVING ENVIRONMENT: Lives with: lives with their family Lives in: House/apartment Able to access home  OCCUPATION: Retired  PLOF: Independent  PATIENT GOALS:Good use of his R shoulder/arm  NEXT MD VISIT: 09/22/23  OBJECTIVE:  Note: Objective measures were completed at Evaluation unless otherwise noted.  DIAGNOSTIC FINDINGS:  08/25/23: AP, scapular Y, axillary views of the shoulder reviewed.  Reverse shoulder  arthroplasty prosthesis in good position and alignment without any  complicating features.  There is no evidence of periprosthetic fracture,  dislocation, dissociation of the glenosphere.   PATIENT SURVEYS:  Duane [facility] 49/55: 86%- severe disability  COGNITION: Overall cognitive status: Within functional limits for tasks assessed     SENSATION: WFL  UPPER EXTREMITY ROM:   Passive ROM Right eval Left eval  Shoulder flexion 75 limited by pain   Shoulder extension    Shoulder Scaption 90 limited by pain   Shoulder adduction    Shoulder internal rotation    Shoulder external rotation 30d limited by precautions. In 30d of abd   Elbow flexion    Elbow extension    Wrist flexion    Wrist extension    Wrist ulnar deviation    Wrist radial deviation    Wrist pronation    Wrist supination    (Blank rows = not tested)  UPPER EXTREMITY MMT:  NT MMT Right eval Left eval  Shoulder flexion    Shoulder extension    Shoulder abduction    Shoulder adduction    Shoulder internal rotation    Shoulder external rotation    Middle trapezius    Lower trapezius    Elbow flexion    Elbow extension    Wrist flexion    Wrist extension    Wrist ulnar  deviation    Wrist radial deviation    Wrist pronation    Wrist supination    Grip strength (lbs)    (Blank rows = not tested)   PALPATION:  TTP to the R peri-shoulder area                                                                                                                             TREATMENT  DATE:  [facility] Adult PT Treatment:                                                DATE: 08/29/23 Therapeutic Exercise: Developed, instructed in, and pt completed therex as noted in HEP  Self Care: R RTSA precautions  PATIENT EDUCATION: Education details: Eval findings, POC, HEP, self care  Person educated: Patient Education method: Explanation, Demonstration, Tactile cues, and Verbal cues Education comprehension: verbalized understanding, returned demonstration, verbal cues required, and tactile cues required  HOME EXERCISE PROGRAM: Access Code: [email or web address] URL: [email or web address] Date: 08/30/2023 Prepared by: Joellyn Rued  Exercises - Seated Shoulder Flexion Towel Slide at Table Top Full Range of Motion  - 3 x daily - 7 x weekly - 1 sets - 10 reps - 3 hold - Seated Shoulder Scaption Slide at Table Top with Forearm in Neutral  - 3 x daily - 7 x weekly - 1 sets - 10 reps - 3 hold  ASSESSMENT:  CLINICAL IMPRESSION: Patient is a 68 y.o. male who was seen today for physical therapy evaluation and treatment for Z96.611 (ICD-10-CM) - S/P reverse total shoulder arthroplasty, right on 08/08/23. Pt presents with anticipated  R shouldeROM deficits 3 weeks s/p surgery. A HEP was initiated. Will progress rehab as per RTSA protocol. Pt will benefit from skilled PT to address impairments to optimize R shoulder function with minimized pain.   OBJECTIVE IMPAIRMENTS: decreased ROM, decreased strength, increased edema, impaired UE functional use, and pain.   ACTIVITY LIMITATIONS: carrying, lifting, bed mobility, bathing, toileting, dressing, self feeding, reach over head, hygiene/grooming, and caring for others  PARTICIPATION LIMITATIONS: meal prep, cleaning, laundry, and driving  PERSONAL FACTORS: Past/current experiences, Time since onset of injury/illness/exacerbation, and 1-2 comorbidities: DM2, smokes  are also affecting patient's functional outcome.   REHAB POTENTIAL: Good  CLINICAL  DECISION MAKING: Evolving/moderate complexity  EVALUATION COMPLEXITY: Moderate   GOALS:  SHORT TERM GOALS: Target date: 09/22/23  Pt will be Ind in an initial HEP  Baseline: started Goal status: INITIAL  2.  Pt's R shoulder PROM will increase to flexion and scaption to 110d and ER, in 30d of abd, to 45d to progress rehabilitation Baseline: see flow sheets Goal status: INITIAL  LONG TERM GOALS: Target date: 11/03/23  Pt will be Ind in a final HEP to maintain achieved LOF Baseline:  Goal status: INITIAL  2.  Pt  will report >/= 50% decrease in pain from evaluation for improved function and QOL Baseline: 7/10 Goal status: INITIAL  3.  Pt will demonstrate >120 degrees of active ROM in flexion to allow completion of activities involving reaching OH, not limited by pain Baseline:  Goal status: INITIAL  4.  [facility] will be able to reach to cabinet repeatedly with at least 3#, not limited by pain Baseline:  Goal status: INITIAL  5. Pt's Quick Dash score will improve to 50% or less as indication of improved function  Baseline: 86% Goal status: INITIAL    PLAN:  PT FREQUENCY: 2x/week  PT DURATION: 8 weeks  PLANNED INTERVENTIONS: 97164- PT Re-evaluation, 97110-Therapeutic exercises, 97530- Therapeutic activity, [id number]- Neuromuscular re-education, 97535- Self Care, 16109- Manual therapy, [id number]- Electrical stimulation (unattended), [id number]- Electrical stimulation (manual), 97035- Ultrasound, Patient/Family education, Taping, Dry Needling, Joint mobilization, Cryotherapy, and Moist heat  PLAN FOR NEXT SESSION: Assess response to HEP; progress therex as indicated; use of modalities, manual therapy; and TPDN as indicated.    Hadli Vandemark MS, PT 08/30/23 9:57 AM  Joellyn Rued MS, PT 08/30/23 1:21 PM

## 2023-08-30 ENCOUNTER — Other Ambulatory Visit: Payer: Self-pay

## 2023-09-04 NOTE — Therapy (Incomplete)
 OUTPATIENT PHYSICAL THERAPY SHOULDER TREATMENT   Patient Name: Duane Cuevas MRN: [id number] DOB:1956/02/27, 68 y.o., male Today's Date: 09/06/2023  END OF SESSION:  PT End of Session - 09/06/23 1108     Visit Number 2    Number of Visits 17    Authorization Type [employer] MEDICARE HMO/PPO; MEDICAID OF [place]    PT Start Time 1105    PT Stop Time 1200    PT Time Calculation (min) 55 min    Activity Tolerance Patient limited by pain    Behavior During Therapy WFL for tasks assessed/performed              Past Medical History:  Diagnosis Date   Cerebral aneurysm, nonruptured    followed by dr n. 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   Coronary artery disease    (pt admitted for chest pain ) nuclear stress test 11-02-2019  low risk, nuclear ef 58%,  prior inferior defect of severe severity indicative of previous MI;;   echo 04-28-2020 ef 60-65%, mild concentric LVH, RSVP 54.56mmHg;;   cardiac CTA 09-15-2020  unremarkable,  calcium score=zero   Dependence on nocturnal oxygen therapy    02-27-2023  per pt daughter,  pt only uses at night w/ O2 sat ,2-3L via [place],  only uses portable if going outside when every hot which is not much   Diverticulosis of colon    DOE (dyspnea on exertion)    02-27-2023  per pt daugter sob w/ stairs, incline's,  ok w/ some yard work if not warm/ hot wearther,  can do house hold chores   ED (erectile dysfunction)    Emphysema/COPD (HCC)    followed by pcp;   uses breztri bid, uses preventil nebulizer every afternoon;   last exacerbation 06/ 2023 in epic   Full dentures    GERD (gastroesophageal reflux disease)    Hepatic steatosis    History of adenomatous polyp of colon    History of gastric ulcer    per EGD 09-16-2020  non-bleeding   History of MI (myocardial infarction)    per nuclear stress test in epic 11-02-2019 showed prior inferior defect of severe severity  indicative of previous MI   History of pelvic fracture    1999  and 2009   Hyperlipidemia    Hypertension    Smokers' cough (HCC)    Type 2 diabetes mellitus treated with insulin (HCC)    followed by pcp;  dx 2016   (02-27-2023  per pt daughter pt   Wears glasses    Past Surgical History:  Procedure Laterality Date   BICEPT TENODESIS Right 08/08/2023   Procedure: BICEPS TENODESIS;  Surgeon: Cammy Copa, MD;  Location: [facility] OR;  Service: Orthopedics;  Laterality: Right;   BIOPSY  09/16/2020   Procedure: BIOPSY;  Surgeon: Tressia Danas, MD;  Location: [facility];  Service: Gastroenterology;;   CHOLECYSTECTOMY N/A 03/20/2022   Procedure: LAPAROSCOPIC CHOLECYSTECTOMY;  Surgeon: Darnell Level, MD;  Location: [facility];  Service: General;  Laterality: N/A;   COLONOSCOPY WITH PROPOFOL  05/10/2018   dr Adela Lank   ESOPHAGOGASTRODUODENOSCOPY N/A 09/23/2015   Procedure: ESOPHAGOGASTRODUODENOSCOPY (EGD);  Surgeon: Meryl Dare, MD;  Location: [facility];  Service: Endoscopy;  Laterality: N/A;   ESOPHAGOGASTRODUODENOSCOPY (EGD) WITH PROPOFOL N/A 09/16/2020   Procedure: ESOPHAGOGASTRODUODENOSCOPY (EGD) WITH PROPOFOL;  Surgeon: Tressia Danas, MD;  Location: [facility];  Service: Gastroenterology;  Laterality: N/A;   EXCISION MASS  NECK Left 03/09/2023   Procedure: EXCISION SOFT TISSUE MASS LEFT POSTERIOR NECK;  Surgeon: Darnell Level, MD;  Location: [facility];  Service: General;  Laterality: Left;   FOOT SURGERY Left 1999   INGUINAL HERNIA REPAIR Left 05/15/2018   Procedure: LEFT INGUINAL HERNIA REPAIR WITH MESH;  Surgeon: Harriette Bouillon, MD;  Location: [facility] OR;  Service: General;  Laterality: Left;   INSERTION OF MESH Left 05/15/2018   Procedure: INSERTION OF MESH;  Surgeon: Harriette Bouillon, MD;  Location: [facility] OR;  Service: General;  Laterality: Left;   INTRAOPERATIVE CHOLANGIOGRAM N/A 03/20/2022   Procedure: INTRAOPERATIVE CHOLANGIOGRAM;  Surgeon: Darnell Level, MD;  Location: [facility];   Service: General;  Laterality: N/A;   REVERSE SHOULDER ARTHROPLASTY Left 08/30/2022   Procedure: LEFT REVERSE SHOULDER ARTHROPLASTY;  Surgeon: Cammy Copa, MD;  Location: [facility] OR;  Service: Orthopedics;  Laterality: Left;   REVERSE SHOULDER ARTHROPLASTY Right 08/08/2023   Procedure: RIGHT REVERSE SHOULDER ARTHROPLASTY;  Surgeon: Cammy Copa, MD;  Location: [facility] OR;  Service: Orthopedics;  Laterality: Right;   SHOULDER ARTHROSCOPY W/ ROTATOR CUFF REPAIR Left    yrs ago   Patient Active Problem List   Diagnosis Date Noted   Arthritis of right shoulder region 08/20/2023   S/P reverse total shoulder arthroplasty, right 08/08/2023   Hematoma of right shoulder 04/17/2023   Contusion of right shoulder 04/17/2023   Rotator cuff arthropathy, left 08/31/2022   S/P reverse total shoulder arthroplasty, left 08/30/2022   Intractable persistent migraine aura without cerebral infarction and with status migrainosus 05/26/2021   Hypertension associated with diabetes (HCC) 09/21/2020   Uncontrolled type 2 diabetes mellitus with hyperglycemia (HCC) 09/13/2020   CAD (coronary artery disease) 01/23/2020   HLD (hyperlipidemia) 11/02/2019   History of MI (myocardial infarction) 11/02/2019   COPD ? GOLD III/ active smoker 08/14/2018   COPD (chronic obstructive pulmonary disease) (HCC) 10/14/2014   Dyslipidemia associated with type 2 diabetes mellitus (HCC) 10/14/2014    PCP: Georgina Quint, MD   REFERRING PROVIDER: Cammy Copa, MD   REFERRING DIAG: [phone] (ICD-10-CM) - S/P reverse total shoulder arthroplasty, right on 08/08/23  THERAPY DIAG:  Right shoulder pain, unspecified chronicity  Muscle weakness  Rationale for Evaluation and Treatment: Rehabilitation  ONSET DATE: 08/08/23  SUBJECTIVE:                                                                                                                                                                                       SUBJECTIVE STATEMENT: Pt reports overall the R shoulder is feeling better except when he lies down, even with support of the R arm. Pt states his HEP is  going well completing 3 to 4x a day.  EVAL: Pt reports his R shoulder is doing about as expected after surgery.   Hand dominance: Right  08/25/23 Ortho-Care Visit Plan: Duane Cuevas is a 68 y.o. male who presents s/p right reverse shoulder arthroplasty on 08/08/2023.  Patient is doing well and pain is overall controlled.  Using CPM machine as instructed.  Denies any chest pain, SOB, fevers, chills.  No complaint of any instability symptoms.  Pain is controlled.  Feels about 40% improved compared with time of surgery..     On exam, patient has range of motion 30 degrees X rotation, 90 degrees abduction, 120 degrees forward elevation passively.  Intact EPL, FPL, finger abduction, finger adduction, pronation/supination, bicep, tricep, deltoid of operative extremity.  Axillary nerve intact with deltoid firing.  Incision is healing well without evidence of infection or dehiscence.  Incision was made sure to be covered with Steri-Strips from the proximal to distal aspect of the length of the incision.  2+ radial pulse of the operative extremity   Plan is discontinue sling.  Okay to very lightly lift with the operative extremity but no lifting anything heavier than a coffee cup or cell phone.  Start physical therapy to focus on passive range of motion and active range of motion with deltoid isometrics.  Do not want to externally rotate past 30 degrees to protect subscapularis repair.  Follow-up in 4 weeks for clinical recheck with Dr. August Saucer.  PERTINENT HISTORY: Significant for cardiopulmonary disease, Hx L RTSA, Smokes, DM2  PAIN:  Are you having pain? Yes: NPRS scale: 4/10 Pain location: R shoulder Pain description: Ache, sharp Aggravating factors: Lying in bed Relieving factors: Medications and cold pack  PRECAUTIONS: R RTSA 2/27 with subscap  repair   RED FLAGS: None   WEIGHT BEARING RESTRICTIONS: Yes R RTSA 08/08/23  FALLS:  Has patient fallen in last 6 months? No  LIVING ENVIRONMENT: Lives with: lives with their family Lives in: House/apartment Able to access home  OCCUPATION: Retired  PLOF: Independent  PATIENT GOALS:Good use of his R shoulder/arm  NEXT MD VISIT: 09/22/23  OBJECTIVE:  Note: Objective measures were completed at Evaluation unless otherwise noted.  DIAGNOSTIC FINDINGS:  08/25/23: AP, scapular Y, axillary views of the shoulder reviewed.  Reverse shoulder  arthroplasty prosthesis in good position and alignment without any  complicating features.  There is no evidence of periprosthetic fracture,  dislocation, dissociation of the glenosphere.   PATIENT SURVEYS:  Neldon Mc 49/55: 86%- severe disability  COGNITION: Overall cognitive status: Within functional limits for tasks assessed     SENSATION: WFL  UPPER EXTREMITY ROM:   Passive ROM Right eval Left eval Rt 09/06/23  Shoulder flexion 75 limited by pain  100d  Shoulder extension     Shoulder Scaption 90 limited by pain    Shoulder adduction     Shoulder internal rotation     Shoulder external rotation 30d limited by precautions. In 30d of abd    Elbow flexion     Elbow extension     Wrist flexion     Wrist extension     Wrist ulnar deviation     Wrist radial deviation     Wrist pronation     Wrist supination     (Blank rows = not tested)  UPPER EXTREMITY MMT:  NT MMT Right eval Left eval  Shoulder flexion    Shoulder extension    Shoulder abduction    Shoulder adduction  Shoulder internal rotation    Shoulder external rotation    Middle trapezius    Lower trapezius    Elbow flexion    Elbow extension    Wrist flexion    Wrist extension    Wrist ulnar deviation    Wrist radial deviation    Wrist pronation    Wrist supination    Grip strength (lbs)    (Blank rows = not tested)   PALPATION:  TTP to the R  peri-shoulder area                                                                                                                             TREATMENT DATE:  [facility] Adult PT Treatment:                                                DATE: 09/06/23 Therapeutic Exercise: Supine AAROM for shoulder flexion c dowel  x10 Supine AAROM for shoulder ER c dowel x10  to 30d at 30d abd Isometrics for flex, abd, ER x5 5" 50% effort Manual Therapy: PROM for shoulder flex, abd, ER to 30d at 30D of abd Modalities: Vaso to the R shoulder x15 mins, low pressure, 36dF  [facility] Adult PT Treatment:                                                DATE: 08/29/23 Therapeutic Exercise: Developed, instructed in, and pt completed therex as noted in HEP  Self Care: R RTSA precautions  PATIENT EDUCATION: Education details: Eval findings, POC, HEP, self care  Person educated: Patient Education method: Explanation, Demonstration, Tactile cues, and Verbal cues Education comprehension: verbalized understanding, returned demonstration, verbal cues required, and tactile cues required  HOME EXERCISE PROGRAM: Access Code: [email or web address] URL: [email or web address] Date: 09/06/2023 Prepared by: Joellyn Rued  Exercises - Seated Shoulder Flexion Towel Slide at Table Top Full Range of Motion  - 3 x daily - 7 x weekly - 1 sets - 10 reps - 3 hold - Seated Shoulder Scaption Slide at Table Top with Forearm in Neutral  - 3 x daily - 7 x weekly - 1 sets - 10 reps - 3 hold - Isometric Shoulder Flexion at Wall  - 1 x daily - 7 x weekly - 1 sets - 5 reps - 5 hold - Isometric Shoulder Abduction at Wall  - 1 x daily - 7 x weekly - 1 sets - 5 reps - 5 hold - Isometric Shoulder External Rotation at Wall  - 1 x daily - 7 x weekly - 1 sets - 5 reps - 5 hold  ASSESSMENT:  CLINICAL IMPRESSION: Pt was completed R shoulder rTSA rehab 4 weeks  out from surgery. AAROM for shoulder flexion has increased. Isometric exs for flexion, abd, and  ER were started and added to the pt's HEP. Pt's R shoulder is recovering apprpriately. Vaso was completed at the the of the session for symptom, pain and swelling, management. Pt tolerated PT today without adverse effects.   EVAL: Patient is a 68 y.o. male who was seen today for physical therapy evaluation and treatment for Z96.611 (ICD-10-CM) - S/P reverse total shoulder arthroplasty, right on 08/08/23. Pt presents with anticipated  R shouldeROM deficits 3 weeks s/p surgery. A HEP was initiated. Will progress rehab as per RTSA protocol. Pt will benefit from skilled PT to address impairments to optimize R shoulder function with minimized pain.   OBJECTIVE IMPAIRMENTS: decreased ROM, decreased strength, increased edema, impaired UE functional use, and pain.   ACTIVITY LIMITATIONS: carrying, lifting, bed mobility, bathing, toileting, dressing, self feeding, reach over head, hygiene/grooming, and caring for others  PARTICIPATION LIMITATIONS: meal prep, cleaning, laundry, and driving  PERSONAL FACTORS: Past/current experiences, Time since onset of injury/illness/exacerbation, and 1-2 comorbidities: DM2, smokes  are also affecting patient's functional outcome.   REHAB POTENTIAL: Good  CLINICAL DECISION MAKING: Evolving/moderate complexity  EVALUATION COMPLEXITY: Moderate   GOALS:  SHORT TERM GOALS: Target date: 09/22/23  Pt will be Ind in an initial HEP  Baseline: started Goal status: INITIAL  2.  Pt's R shoulder PROM will increase to flexion and scaption to 110d and ER, in 30d of abd, to 45d to progress rehabilitation Baseline: see flow sheets Goal status: INITIAL  LONG TERM GOALS: Target date: 11/03/23  Pt will be Ind in a final HEP to maintain achieved LOF Baseline:  Goal status: INITIAL  2.  Pt will report >/= 50% decrease in pain from evaluation for improved function and QOL Baseline: 7/10 Goal status: INITIAL  3.  Pt will demonstrate >120 degrees of active ROM in flexion to  allow completion of activities involving reaching OH, not limited by pain Baseline:  Goal status: INITIAL  4.  Allex will be able to reach to cabinet repeatedly with at least 3#, not limited by pain Baseline:  Goal status: INITIAL  5. Pt's Quick Dash score will improve to 50% or less as indication of improved function  Baseline: 86% Goal status: INITIAL    PLAN:  PT FREQUENCY: 2x/week  PT DURATION: 8 weeks  PLANNED INTERVENTIONS: 97164- PT Re-evaluation, 97110-Therapeutic exercises, 97530- Therapeutic activity, [id number]- Neuromuscular re-education, 97535- Self Care, 16109- Manual therapy, [id number]- Electrical stimulation (unattended), [id number]- Electrical stimulation (manual), 97035- Ultrasound, Patient/Family education, Taping, Dry Needling, Joint mobilization, Cryotherapy, and Moist heat  PLAN FOR NEXT SESSION: Assess response to HEP; progress therex as indicated; use of modalities, manual therapy; and TPDN as indicated.    Jonathan Corpus MS, PT 09/06/23 1:32 PM  Joellyn Rued MS, PT 09/06/23 3:54 PM

## 2023-09-06 ENCOUNTER — Ambulatory Visit: Payer: Medicare HMO | Attending: Orthopedic Surgery

## 2023-09-06 DIAGNOSIS — R600 Localized edema: Secondary | ICD-10-CM | POA: Insufficient documentation

## 2023-09-06 DIAGNOSIS — M25511 Pain in right shoulder: Secondary | ICD-10-CM | POA: Insufficient documentation

## 2023-09-06 DIAGNOSIS — M6281 Muscle weakness (generalized): Secondary | ICD-10-CM | POA: Diagnosis not present

## 2023-09-06 NOTE — Therapy (Signed)
 OUTPATIENT PHYSICAL THERAPY SHOULDER TREATMENT   Patient Name: Duane Cuevas MRN: [id number] DOB:05-29-1956, 68 y.o., male Today's Date: 09/08/2023  END OF SESSION:  PT End of Session - 09/08/23 1418     Visit Number 3    Number of Visits 17    Date for PT Re-Evaluation 11/03/23    Authorization Type [employer] MEDICARE HMO/PPO; MEDICAID OF [place]    PT Start Time 1319    PT Stop Time 1405    PT Time Calculation (min) 46 min    Activity Tolerance Patient limited by pain    Behavior During Therapy WFL for tasks assessed/performed               Past Medical History:  Diagnosis Date   Cerebral aneurysm, nonruptured    followed by dr n. 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   Coronary artery disease    (pt admitted for chest pain ) nuclear stress test 11-02-2019  low risk, nuclear ef 58%,  prior inferior defect of severe severity indicative of previous MI;;   echo 04-28-2020 ef 60-65%, mild concentric LVH, RSVP 54.76mmHg;;   cardiac CTA 09-15-2020  unremarkable,  calcium score=zero   Dependence on nocturnal oxygen therapy    02-27-2023  per pt daughter,  pt only uses at night w/ O2 sat ,2-3L via [place],  only uses portable if going outside when every hot which is not much   Diverticulosis of colon    DOE (dyspnea on exertion)    02-27-2023  per pt daugter sob w/ stairs, incline's,  ok w/ some yard work if not warm/ hot wearther,  can do house hold chores   ED (erectile dysfunction)    Emphysema/COPD (HCC)    followed by pcp;   uses breztri bid, uses preventil nebulizer every afternoon;   last exacerbation 06/ 2023 in epic   Full dentures    GERD (gastroesophageal reflux disease)    Hepatic steatosis    History of adenomatous polyp of colon    History of gastric ulcer    per EGD 09-16-2020  non-bleeding   History of MI (myocardial infarction)    per nuclear stress test in epic 11-02-2019 showed prior  inferior defect of severe severity indicative of previous MI   History of pelvic fracture    1999  and 2009   Hyperlipidemia    Hypertension    Smokers' cough (HCC)    Type 2 diabetes mellitus treated with insulin (HCC)    followed by pcp;  dx 2016   (02-27-2023  per pt daughter pt   Wears glasses    Past Surgical History:  Procedure Laterality Date   BICEPT TENODESIS Right 08/08/2023   Procedure: BICEPS TENODESIS;  Surgeon: Cammy Copa, MD;  Location: [facility] OR;  Service: Orthopedics;  Laterality: Right;   BIOPSY  09/16/2020   Procedure: BIOPSY;  Surgeon: Tressia Danas, MD;  Location: [facility];  Service: Gastroenterology;;   CHOLECYSTECTOMY N/A 03/20/2022   Procedure: LAPAROSCOPIC CHOLECYSTECTOMY;  Surgeon: Darnell Level, MD;  Location: [facility];  Service: General;  Laterality: N/A;   COLONOSCOPY WITH PROPOFOL  05/10/2018   dr Adela Lank   ESOPHAGOGASTRODUODENOSCOPY N/A 09/23/2015   Procedure: ESOPHAGOGASTRODUODENOSCOPY (EGD);  Surgeon: Meryl Dare, MD;  Location: [facility];  Service: Endoscopy;  Laterality: N/A;   ESOPHAGOGASTRODUODENOSCOPY (EGD) WITH PROPOFOL N/A 09/16/2020   Procedure: ESOPHAGOGASTRODUODENOSCOPY (EGD) WITH PROPOFOL;  Surgeon: Tressia Danas, MD;  Location: [facility];  Service: Gastroenterology;  Laterality: N/A;   EXCISION MASS NECK Left 03/09/2023   Procedure: EXCISION SOFT TISSUE MASS LEFT POSTERIOR NECK;  Surgeon: Darnell Level, MD;  Location: [facility];  Service: General;  Laterality: Left;   FOOT SURGERY Left 1999   INGUINAL HERNIA REPAIR Left 05/15/2018   Procedure: LEFT INGUINAL HERNIA REPAIR WITH MESH;  Surgeon: Harriette Bouillon, MD;  Location: [facility] OR;  Service: General;  Laterality: Left;   INSERTION OF MESH Left 05/15/2018   Procedure: INSERTION OF MESH;  Surgeon: Harriette Bouillon, MD;  Location: [facility] OR;  Service: General;  Laterality: Left;   INTRAOPERATIVE CHOLANGIOGRAM N/A 03/20/2022   Procedure: INTRAOPERATIVE CHOLANGIOGRAM;  Surgeon:  Darnell Level, MD;  Location: [facility];  Service: General;  Laterality: N/A;   REVERSE SHOULDER ARTHROPLASTY Left 08/30/2022   Procedure: LEFT REVERSE SHOULDER ARTHROPLASTY;  Surgeon: Cammy Copa, MD;  Location: [facility] OR;  Service: Orthopedics;  Laterality: Left;   REVERSE SHOULDER ARTHROPLASTY Right 08/08/2023   Procedure: RIGHT REVERSE SHOULDER ARTHROPLASTY;  Surgeon: Cammy Copa, MD;  Location: [facility] OR;  Service: Orthopedics;  Laterality: Right;   SHOULDER ARTHROSCOPY W/ ROTATOR CUFF REPAIR Left    yrs ago   Patient Active Problem List   Diagnosis Date Noted   Arthritis of right shoulder region 08/20/2023   S/P reverse total shoulder arthroplasty, right 08/08/2023   Hematoma of right shoulder 04/17/2023   Contusion of right shoulder 04/17/2023   Rotator cuff arthropathy, left 08/31/2022   S/P reverse total shoulder arthroplasty, left 08/30/2022   Intractable persistent migraine aura without cerebral infarction and with status migrainosus 05/26/2021   Hypertension associated with diabetes (HCC) 09/21/2020   Uncontrolled type 2 diabetes mellitus with hyperglycemia (HCC) 09/13/2020   CAD (coronary artery disease) 01/23/2020   HLD (hyperlipidemia) 11/02/2019   History of MI (myocardial infarction) 11/02/2019   COPD ? GOLD III/ active smoker 08/14/2018   COPD (chronic obstructive pulmonary disease) (HCC) 10/14/2014   Dyslipidemia associated with type 2 diabetes mellitus (HCC) 10/14/2014    PCP: Georgina Quint, MD   REFERRING PROVIDER: Cammy Copa, MD   REFERRING DIAG: [phone] (ICD-10-CM) - S/P reverse total shoulder arthroplasty, right on 08/08/23  THERAPY DIAG:  Right shoulder pain, unspecified chronicity  Muscle weakness  Localized edema  Rationale for Evaluation and Treatment: Rehabilitation  ONSET DATE: 08/08/23  SUBJECTIVE:                                                                                                                                                                                       SUBJECTIVE STATEMENT: Pt reports R shoulder is improving, pain and better motion. Pain when going to  sleep is the biggest issue. Pt notes he is completing his HEP 3x daily and using the ice machine afterwards.  EVAL: Pt reports his R shoulder is doing about as expected after surgery.   Hand dominance: Right  08/25/23 Ortho-Care Visit Plan: Murrel Bertram is a 68 y.o. male who presents s/p right reverse shoulder arthroplasty on 08/08/2023.  Patient is doing well and pain is overall controlled.  Using CPM machine as instructed.  Denies any chest pain, SOB, fevers, chills.  No complaint of any instability symptoms.  Pain is controlled.  Feels about 40% improved compared with time of surgery..     On exam, patient has range of motion 30 degrees X rotation, 90 degrees abduction, 120 degrees forward elevation passively.  Intact EPL, FPL, finger abduction, finger adduction, pronation/supination, bicep, tricep, deltoid of operative extremity.  Axillary nerve intact with deltoid firing.  Incision is healing well without evidence of infection or dehiscence.  Incision was made sure to be covered with Steri-Strips from the proximal to distal aspect of the length of the incision.  2+ radial pulse of the operative extremity   Plan is discontinue sling.  Okay to very lightly lift with the operative extremity but no lifting anything heavier than a coffee cup or cell phone.  Start physical therapy to focus on passive range of motion and active range of motion with deltoid isometrics.  Do not want to externally rotate past 30 degrees to protect subscapularis repair.  Follow-up in 4 weeks for clinical recheck with Dr. August Saucer.  PERTINENT HISTORY: Significant for cardiopulmonary disease, Hx L RTSA, Smokes, DM2  PAIN:  Are you having pain? Yes: NPRS scale: 4/10 Pain location: R shoulder Pain description: Ache, sharp Aggravating factors: Lying in bed Relieving  factors: Medications and cold pack  PRECAUTIONS: R RTSA 2/27 with subscap repair   RED FLAGS: None   WEIGHT BEARING RESTRICTIONS: Yes R RTSA 08/08/23  FALLS:  Has patient fallen in last 6 months? No  LIVING ENVIRONMENT: Lives with: lives with their family Lives in: House/apartment Able to access home  OCCUPATION: Retired  PLOF: Independent  PATIENT GOALS:Good use of his R shoulder/arm  NEXT MD VISIT: 09/22/23  OBJECTIVE:  Note: Objective measures were completed at Evaluation unless otherwise noted.  DIAGNOSTIC FINDINGS:  08/25/23: AP, scapular Y, axillary views of the shoulder reviewed.  Reverse shoulder  arthroplasty prosthesis in good position and alignment without any  complicating features.  There is no evidence of periprosthetic fracture,  dislocation, dissociation of the glenosphere.   PATIENT SURVEYS:  Neldon Mc 49/55: 86%- severe disability  COGNITION: Overall cognitive status: Within functional limits for tasks assessed     SENSATION: [facility]  UPPER EXTREMITY ROM:   Passive ROM Right eval Left eval Rt 09/06/23 Rt 09/08/23  Shoulder flexion 75 limited by pain  100d AA 120d  Shoulder extension      Shoulder Scaption 90 limited by pain   AA 115d  Shoulder adduction      Shoulder internal rotation      Shoulder external rotation 30d limited by precautions. In 30d of abd     Elbow flexion      Elbow extension      Wrist flexion      Wrist extension      Wrist ulnar deviation      Wrist radial deviation      Wrist pronation      Wrist supination      (Blank rows = not tested)  UPPER  EXTREMITY MMT:  NT MMT Right eval Left eval  Shoulder flexion    Shoulder extension    Shoulder abduction    Shoulder adduction    Shoulder internal rotation    Shoulder external rotation    Middle trapezius    Lower trapezius    Elbow flexion    Elbow extension    Wrist flexion    Wrist extension    Wrist ulnar deviation    Wrist radial deviation    Wrist  pronation    Wrist supination    Grip strength (lbs)    (Blank rows = not tested)   PALPATION:  TTP to the R peri-shoulder area                                                                                                                             TREATMENT DATE:  [facility] Adult PT Treatment:                                                DATE: 09/08/23 Therapeutic Exercise: Supine AAROM for shoulder flexion c dowel  x10 Supine AAROM for shoulder ER c dowel x10  to 30d at 30d abd Isometrics for flex, abd, ER x5 5" 50% effort Manual Therapy: PROM for shoulder flex, abd, ER to 30d at 30d of abd Modalities: Vaso to the R shoulder x15 mins, low pressure, 36dF  [facility] Adult PT Treatment:                                                DATE: 09/06/23 Therapeutic Exercise: Supine AAROM for shoulder flexion c dowel  x10 Supine AAROM for shoulder ER c dowel x10  to 30d at 30d abd Isometrics for flex, abd, ER x5 5" 50% effort Manual Therapy: PROM for shoulder flex, abd, ER to 30d at 30D of abd Modalities: Vaso to the R shoulder x15 mins, low pressure, 36dF  [facility] Adult PT Treatment:                                                DATE: 08/29/23 Therapeutic Exercise: Developed, instructed in, and pt completed therex as noted in HEP  Self Care: R RTSA precautions  PATIENT EDUCATION: Education details: Eval findings, POC, HEP, self care  Person educated: Patient Education method: Explanation, Demonstration, Tactile cues, and Verbal cues Education comprehension: verbalized understanding, returned demonstration, verbal cues required, and tactile cues required  HOME EXERCISE PROGRAM: Access Code: [email or web address] URL: [email or web address] Date: 09/06/2023 Prepared by: Joellyn Rued  Exercises - Seated Shoulder Flexion Towel  Slide at Table Top Full Range of Motion  - 3 x daily - 7 x weekly - 1 sets - 10 reps - 3 hold - Seated Shoulder Scaption Slide at Table Top with Forearm in Neutral   - 3 x daily - 7 x weekly - 1 sets - 10 reps - 3 hold - Isometric Shoulder Flexion at Wall  - 1 x daily - 7 x weekly - 1 sets - 5 reps - 5 hold - Isometric Shoulder Abduction at Wall  - 1 x daily - 7 x weekly - 1 sets - 5 reps - 5 hold - Isometric Shoulder External Rotation at Wall  - 1 x daily - 7 x weekly - 1 sets - 5 reps - 5 hold  ASSESSMENT:  CLINICAL IMPRESSION: Pt was completed R shoulder rTSA rehab 4 weeks out from surgery. AAROM for shoulder flexion and scaption continue to improve. ER ROM is limited to 30d per protocol. Pt tolerated prescribed therex without adverse effects. Vaso was completed at the the of the session for symptom management, pain and swelling. Pt will continue to benefit from skilled PT to address impairments for improved R shoulder function following rTSA.   EVAL: Patient is a 68 y.o. male who was seen today for physical therapy evaluation and treatment for Z96.611 (ICD-10-CM) - S/P reverse total shoulder arthroplasty, right on 08/08/23. Pt presents with anticipated  R shouldeROM deficits 3 weeks s/p surgery. A HEP was initiated. Will progress rehab as per RTSA protocol. Pt will benefit from skilled PT to address impairments to optimize R shoulder function with minimized pain.   OBJECTIVE IMPAIRMENTS: decreased ROM, decreased strength, increased edema, impaired UE functional use, and pain.   ACTIVITY LIMITATIONS: carrying, lifting, bed mobility, bathing, toileting, dressing, self feeding, reach over head, hygiene/grooming, and caring for others  PARTICIPATION LIMITATIONS: meal prep, cleaning, laundry, and driving  PERSONAL FACTORS: Past/current experiences, Time since onset of injury/illness/exacerbation, and 1-2 comorbidities: DM2, smokes  are also affecting patient's functional outcome.   REHAB POTENTIAL: Good  CLINICAL DECISION MAKING: Evolving/moderate complexity  EVALUATION COMPLEXITY: Moderate   GOALS:  SHORT TERM GOALS: Target date: 09/22/23  Pt will be  Ind in an initial HEP  Baseline: started Goal status: ONGOING  2.  Pt's R shoulder PROM will increase to flexion and scaption to 110d and ER, in 30d of abd, to 45d to progress rehabilitation Baseline: see flow sheets Goal status: INITIAL  LONG TERM GOALS: Target date: 11/03/23  Pt will be Ind in a final HEP to maintain achieved LOF Baseline:  Goal status: INITIAL  2.  Pt will report >/= 50% decrease in pain from evaluation for improved function and QOL Baseline: 7/10 Goal status: INITIAL  3.  Pt will demonstrate >120 degrees of active ROM in flexion to allow completion of activities involving reaching OH, not limited by pain Baseline:  Goal status: INITIAL  4.  Rashad will be able to reach to cabinet repeatedly with at least 3#, not limited by pain Baseline:  Goal status: INITIAL  5. Pt's Quick Dash score will improve to 50% or less as indication of improved function  Baseline: 86% Goal status: INITIAL    PLAN:  PT FREQUENCY: 2x/week  PT DURATION: 8 weeks  PLANNED INTERVENTIONS: 97164- PT Re-evaluation, 97110-Therapeutic exercises, 97530- Therapeutic activity, [id number]- Neuromuscular re-education, 97535- Self Care, 16109- Manual therapy, [id number]- Electrical stimulation (unattended), [id number]- Electrical stimulation (manual), 97035- Ultrasound, Patient/Family education, Taping, Dry Needling, Joint mobilization, Cryotherapy, and Moist heat  PLAN FOR  NEXT SESSION: Assess response to HEP; progress therex as indicated; use of modalities, manual therapy; and TPDN as indicated.    Airabella Barley MS, PT 09/08/23 2:32 PM

## 2023-09-08 ENCOUNTER — Ambulatory Visit: Payer: Medicare HMO

## 2023-09-08 DIAGNOSIS — R600 Localized edema: Secondary | ICD-10-CM

## 2023-09-08 DIAGNOSIS — M6281 Muscle weakness (generalized): Secondary | ICD-10-CM | POA: Diagnosis not present

## 2023-09-08 DIAGNOSIS — M25511 Pain in right shoulder: Secondary | ICD-10-CM | POA: Diagnosis not present

## 2023-09-12 NOTE — Therapy (Signed)
 OUTPATIENT PHYSICAL THERAPY SHOULDER TREATMENT   Patient Name: Duane Cuevas MRN: [id number] DOB:October 04, 1955, 68 y.o., male Today's Date: 09/13/2023  END OF SESSION:  PT End of Session - 09/13/23 1111     Visit Number 4    Number of Visits 17    Date for PT Re-Evaluation 11/03/23    Authorization Type [employer] MEDICARE HMO/PPO; MEDICAID OF [place]    PT Start Time 1107    PT Stop Time 1152    PT Time Calculation (min) 45 min    Activity Tolerance Patient limited by pain    Behavior During Therapy WFL for tasks assessed/performed                Past Medical History:  Diagnosis Date   Cerebral aneurysm, nonruptured    followed by dr n. 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   Coronary artery disease    (pt admitted for chest pain ) nuclear stress test 11-02-2019  low risk, nuclear ef 58%,  prior inferior defect of severe severity indicative of previous MI;;   echo 04-28-2020 ef 60-65%, mild concentric LVH, RSVP 54.38mmHg;;   cardiac CTA 09-15-2020  unremarkable,  calcium score=zero   Dependence on nocturnal oxygen therapy    02-27-2023  per pt daughter,  pt only uses at night w/ O2 sat ,2-3L via [place],  only uses portable if going outside when every hot which is not much   Diverticulosis of colon    DOE (dyspnea on exertion)    02-27-2023  per pt daugter sob w/ stairs, incline's,  ok w/ some yard work if not warm/ hot wearther,  can do house hold chores   ED (erectile dysfunction)    Emphysema/COPD (HCC)    followed by pcp;   uses breztri bid, uses preventil nebulizer every afternoon;   last exacerbation 06/ 2023 in epic   Full dentures    GERD (gastroesophageal reflux disease)    Hepatic steatosis    History of adenomatous polyp of colon    History of gastric ulcer    per EGD 09-16-2020  non-bleeding   History of MI (myocardial infarction)    per nuclear stress test in epic 11-02-2019 showed  prior inferior defect of severe severity indicative of previous MI   History of pelvic fracture    1999  and 2009   Hyperlipidemia    Hypertension    Smokers' cough (HCC)    Type 2 diabetes mellitus treated with insulin (HCC)    followed by pcp;  dx 2016   (02-27-2023  per pt daughter pt   Wears glasses    Past Surgical History:  Procedure Laterality Date   BICEPT TENODESIS Right 08/08/2023   Procedure: BICEPS TENODESIS;  Surgeon: Cammy Copa, MD;  Location: [facility] OR;  Service: Orthopedics;  Laterality: Right;   BIOPSY  09/16/2020   Procedure: BIOPSY;  Surgeon: Tressia Danas, MD;  Location: [facility];  Service: Gastroenterology;;   CHOLECYSTECTOMY N/A 03/20/2022   Procedure: LAPAROSCOPIC CHOLECYSTECTOMY;  Surgeon: Darnell Level, MD;  Location: [facility];  Service: General;  Laterality: N/A;   COLONOSCOPY WITH PROPOFOL  05/10/2018   dr Adela Lank   ESOPHAGOGASTRODUODENOSCOPY N/A 09/23/2015   Procedure: ESOPHAGOGASTRODUODENOSCOPY (EGD);  Surgeon: Meryl Dare, MD;  Location: [facility];  Service: Endoscopy;  Laterality: N/A;   ESOPHAGOGASTRODUODENOSCOPY (EGD) WITH PROPOFOL N/A 09/16/2020   Procedure: ESOPHAGOGASTRODUODENOSCOPY (EGD) WITH PROPOFOL;  Surgeon: Tressia Danas, MD;  Location: [facility];  Service: Gastroenterology;  Laterality: N/A;   EXCISION MASS NECK Left 03/09/2023   Procedure: EXCISION SOFT TISSUE MASS LEFT POSTERIOR NECK;  Surgeon: Darnell Level, MD;  Location: [facility];  Service: General;  Laterality: Left;   FOOT SURGERY Left 1999   INGUINAL HERNIA REPAIR Left 05/15/2018   Procedure: LEFT INGUINAL HERNIA REPAIR WITH MESH;  Surgeon: Harriette Bouillon, MD;  Location: [facility] OR;  Service: General;  Laterality: Left;   INSERTION OF MESH Left 05/15/2018   Procedure: INSERTION OF MESH;  Surgeon: Harriette Bouillon, MD;  Location: [facility] OR;  Service: General;  Laterality: Left;   INTRAOPERATIVE CHOLANGIOGRAM N/A 03/20/2022   Procedure: INTRAOPERATIVE CHOLANGIOGRAM;  Surgeon:  Darnell Level, MD;  Location: [facility];  Service: General;  Laterality: N/A;   REVERSE SHOULDER ARTHROPLASTY Left 08/30/2022   Procedure: LEFT REVERSE SHOULDER ARTHROPLASTY;  Surgeon: Cammy Copa, MD;  Location: [facility] OR;  Service: Orthopedics;  Laterality: Left;   REVERSE SHOULDER ARTHROPLASTY Right 08/08/2023   Procedure: RIGHT REVERSE SHOULDER ARTHROPLASTY;  Surgeon: Cammy Copa, MD;  Location: [facility] OR;  Service: Orthopedics;  Laterality: Right;   SHOULDER ARTHROSCOPY W/ ROTATOR CUFF REPAIR Left    yrs ago   Patient Active Problem List   Diagnosis Date Noted   Arthritis of right shoulder region 08/20/2023   S/P reverse total shoulder arthroplasty, right 08/08/2023   Hematoma of right shoulder 04/17/2023   Contusion of right shoulder 04/17/2023   Rotator cuff arthropathy, left 08/31/2022   S/P reverse total shoulder arthroplasty, left 08/30/2022   Intractable persistent migraine aura without cerebral infarction and with status migrainosus 05/26/2021   Hypertension associated with diabetes (HCC) 09/21/2020   Uncontrolled type 2 diabetes mellitus with hyperglycemia (HCC) 09/13/2020   CAD (coronary artery disease) 01/23/2020   HLD (hyperlipidemia) 11/02/2019   History of MI (myocardial infarction) 11/02/2019   COPD ? GOLD III/ active smoker 08/14/2018   COPD (chronic obstructive pulmonary disease) (HCC) 10/14/2014   Dyslipidemia associated with type 2 diabetes mellitus (HCC) 10/14/2014    PCP: Georgina Quint, MD   REFERRING PROVIDER: Cammy Copa, MD   REFERRING DIAG: [phone] (ICD-10-CM) - S/P reverse total shoulder arthroplasty, right on 08/08/23  THERAPY DIAG:  Right shoulder pain, unspecified chronicity  Muscle weakness  Localized edema  Rationale for Evaluation and Treatment: Rehabilitation  ONSET DATE: 08/08/23  SUBJECTIVE:                                                                                                                                                                                       SUBJECTIVE STATEMENT: Pt reports overall r shoulder pain is better  EVAL: Pt reports his R  shoulder is doing about as expected after surgery.   Hand dominance: Right  08/25/23 Ortho-Care Visit Plan: Duane Cuevas is a 68 y.o. male who presents s/p right reverse shoulder arthroplasty on 08/08/2023.  Patient is doing well and pain is overall controlled.  Using CPM machine as instructed.  Denies any chest pain, SOB, fevers, chills.  No complaint of any instability symptoms.  Pain is controlled.  Feels about 40% improved compared with time of surgery..     On exam, patient has range of motion 30 degrees X rotation, 90 degrees abduction, 120 degrees forward elevation passively.  Intact EPL, FPL, finger abduction, finger adduction, pronation/supination, bicep, tricep, deltoid of operative extremity.  Axillary nerve intact with deltoid firing.  Incision is healing well without evidence of infection or dehiscence.  Incision was made sure to be covered with Steri-Strips from the proximal to distal aspect of the length of the incision.  2+ radial pulse of the operative extremity   Plan is discontinue sling.  Okay to very lightly lift with the operative extremity but no lifting anything heavier than a coffee cup or cell phone.  Start physical therapy to focus on passive range of motion and active range of motion with deltoid isometrics.  Do not want to externally rotate past 30 degrees to protect subscapularis repair.  Follow-up in 4 weeks for clinical recheck with Dr. August Saucer.  PERTINENT HISTORY: Significant for cardiopulmonary disease, Hx L RTSA, Smokes, DM2  PAIN:  Are you having pain? Yes: NPRS scale: 3/10 Pain location: R shoulder Pain description: Ache, sharp Aggravating factors: Lying in bed Relieving factors: Medications and cold pack  PRECAUTIONS: R RTSA 2/27 with subscap repair   RED FLAGS: None   WEIGHT BEARING RESTRICTIONS: Yes R RTSA  08/08/23  FALLS:  Has patient fallen in last 6 months? No  LIVING ENVIRONMENT: Lives with: lives with their family Lives in: House/apartment Able to access home  OCCUPATION: Retired  PLOF: Independent  PATIENT GOALS:Good use of his R shoulder/arm  NEXT MD VISIT: 09/22/23  OBJECTIVE:  Note: Objective measures were completed at Evaluation unless otherwise noted.  DIAGNOSTIC FINDINGS:  08/25/23: AP, scapular Y, axillary views of the shoulder reviewed.  Reverse shoulder  arthroplasty prosthesis in good position and alignment without any  complicating features.  There is no evidence of periprosthetic fracture,  dislocation, dissociation of the glenosphere.   PATIENT SURVEYS:  Neldon Mc 49/55: 86%- severe disability  COGNITION: Overall cognitive status: Within functional limits for tasks assessed     SENSATION: [facility]  UPPER EXTREMITY ROM:   Passive ROM Right eval Left eval Rt 09/06/23 Rt 09/08/23  Shoulder flexion 75 limited by pain  100d AA 120d  Shoulder extension      Shoulder Scaption 90 limited by pain   AA 115d  Shoulder adduction      Shoulder internal rotation      Shoulder external rotation 30d limited by precautions. In 30d of abd     Elbow flexion      Elbow extension      Wrist flexion      Wrist extension      Wrist ulnar deviation      Wrist radial deviation      Wrist pronation      Wrist supination      (Blank rows = not tested)  UPPER EXTREMITY MMT:  NT MMT Right eval Left eval  Shoulder flexion    Shoulder extension    Shoulder abduction    Shoulder  adduction    Shoulder internal rotation    Shoulder external rotation    Middle trapezius    Lower trapezius    Elbow flexion    Elbow extension    Wrist flexion    Wrist extension    Wrist ulnar deviation    Wrist radial deviation    Wrist pronation    Wrist supination    Grip strength (lbs)    (Blank rows = not tested)   PALPATION:  TTP to the R peri-shoulder area                                                                                                                              TREATMENT DATE:  [facility] Adult PT Treatment:        5 weeks s/p Sx                                         DATE: 09/12/23 Therapeutic Exercise: Seated AAROM for shoulder flexion ad scaption on table x15 Supine AAROM for shoulder flexion c dowel  x10 Supine AAROM for shoulder ER c dowel x10  to 40d at 30d abd Isometrics for flex, abd, ER x5 5" 50% effort Manual Therapy: PROM for shoulder flex, abd, ER to 40d at 30d of abd Modalities: Vaso to the R shoulder x15 mins, low pressure, 36dF  [facility] Adult PT Treatment:                                                DATE: 09/08/23 Therapeutic Exercise: Supine AAROM for shoulder flexion c dowel  x10 Supine AAROM for shoulder ER c dowel x10  to 30d at 30d abd Isometrics for flex, abd, ER x5 5" 50% effort Manual Therapy: PROM for shoulder flex, abd, ER to 30d at 30d of abd Modalities: Vaso to the R shoulder x15 mins, low pressure, 36dF  [facility] Adult PT Treatment:                                                DATE: 09/06/23 Therapeutic Exercise: Supine AAROM for shoulder flexion c dowel  x10 Supine AAROM for shoulder ER c dowel x10  to 30d at 30d abd Isometrics for flex, abd, ER x5 5" 50% effort Manual Therapy: PROM for shoulder flex, abd, ER to 30d at 30D of abd Modalities: Vaso to the R shoulder x15 mins, low pressure, 36dF  [facility] Adult PT Treatment:  DATE: 08/29/23 Therapeutic Exercise: Developed, instructed in, and pt completed therex as noted in HEP  Self Care: R RTSA precautions  PATIENT EDUCATION: Education details: Eval findings, POC, HEP, self care  Person educated: Patient Education method: Explanation, Demonstration, Tactile cues, and Verbal cues Education comprehension: verbalized understanding, returned demonstration, verbal cues required, and tactile cues required  HOME EXERCISE  PROGRAM: Access Code: [email or web address] URL: [email or web address] Date: 09/06/2023 Prepared by: Joellyn Rued  Exercises - Seated Shoulder Flexion Towel Slide at Table Top Full Range of Motion  - 3 x daily - 7 x weekly - 1 sets - 10 reps - 3 hold - Seated Shoulder Scaption Slide at Table Top with Forearm in Neutral  - 3 x daily - 7 x weekly - 1 sets - 10 reps - 3 hold - Isometric Shoulder Flexion at Wall  - 1 x daily - 7 x weekly - 1 sets - 5 reps - 5 hold - Isometric Shoulder Abduction at Wall  - 1 x daily - 7 x weekly - 1 sets - 5 reps - 5 hold - Isometric Shoulder External Rotation at Wall  - 1 x daily - 7 x weekly - 1 sets - 5 reps - 5 hold  ASSESSMENT:  CLINICAL IMPRESSION: PT was continued for R shoulder rTSA rehab 5 weeks out from surgery for AAROM and submaximal isometric strengthening. Per protocol ER is allowed to 45d with AAROM being achieved to 40d today. Pt is making appropriate progress re: time frame after surgery. Pt tolerated prescribed therex without adverse effects. Vaso was completed at the of the session for symptom management, pain and swelling.   EVAL: Patient is a 67 y.o. male who was seen today for physical therapy evaluation and treatment for Z96.611 (ICD-10-CM) - S/P reverse total shoulder arthroplasty, right on 08/08/23. Pt presents with anticipated  R shouldeROM deficits 3 weeks s/p surgery. A HEP was initiated. Will progress rehab as per RTSA protocol. Pt will benefit from skilled PT to address impairments to optimize R shoulder function with minimized pain.   OBJECTIVE IMPAIRMENTS: decreased ROM, decreased strength, increased edema, impaired UE functional use, and pain.   ACTIVITY LIMITATIONS: carrying, lifting, bed mobility, bathing, toileting, dressing, self feeding, reach over head, hygiene/grooming, and caring for others  PARTICIPATION LIMITATIONS: meal prep, cleaning, laundry, and driving  PERSONAL FACTORS: Past/current experiences, Time since onset of  injury/illness/exacerbation, and 1-2 comorbidities: DM2, smokes  are also affecting patient's functional outcome.   REHAB POTENTIAL: Good  CLINICAL DECISION MAKING: Evolving/moderate complexity  EVALUATION COMPLEXITY: Moderate   GOALS:  SHORT TERM GOALS: Target date: 09/22/23  Pt will be Ind in an initial HEP  Baseline: started Goal status: ONGOING  2.  Pt's R shoulder PROM will increase to flexion and scaption to 110d and ER, in 30d of abd, to 45d to progress rehabilitation Baseline: see flow sheets Goal status: INITIAL  LONG TERM GOALS: Target date: 11/03/23  Pt will be Ind in a final HEP to maintain achieved LOF Baseline:  Goal status: INITIAL  2.  Pt will report >/= 50% decrease in pain from evaluation for improved function and QOL Baseline: 7/10 Goal status: INITIAL  3.  Pt will demonstrate >120 degrees of active ROM in flexion to allow completion of activities involving reaching OH, not limited by pain Baseline:  Goal status: INITIAL  4.  Duane Cuevas will be able to reach to cabinet repeatedly with at least 3#, not limited by pain Baseline:  Goal status: INITIAL  5. Pt's Quick Sharilyn Sites  score will improve to 50% or less as indication of improved function  Baseline: 86% Goal status: INITIAL    PLAN:  PT FREQUENCY: 2x/week  PT DURATION: 8 weeks  PLANNED INTERVENTIONS: [id number]- PT Re-evaluation, 97110-Therapeutic exercises, 97530- Therapeutic activity, [id number]- Neuromuscular re-education, 97535- Self Care, 96045- Manual therapy, [id number]- Electrical stimulation (unattended), [id number]- Electrical stimulation (manual), 97035- Ultrasound, Patient/Family education, Taping, Dry Needling, Joint mobilization, Cryotherapy, and Moist heat  PLAN FOR NEXT SESSION: Assess response to HEP; progress therex as indicated; use of modalities, manual therapy; and TPDN as indicated.    Aika Brzoska MS, PT 09/13/23 11:48 AM

## 2023-09-13 ENCOUNTER — Ambulatory Visit: Payer: Medicare HMO

## 2023-09-13 DIAGNOSIS — M25511 Pain in right shoulder: Secondary | ICD-10-CM

## 2023-09-13 DIAGNOSIS — R600 Localized edema: Secondary | ICD-10-CM

## 2023-09-13 DIAGNOSIS — M6281 Muscle weakness (generalized): Secondary | ICD-10-CM

## 2023-09-15 ENCOUNTER — Ambulatory Visit: Payer: Medicare HMO

## 2023-09-15 DIAGNOSIS — R600 Localized edema: Secondary | ICD-10-CM

## 2023-09-15 DIAGNOSIS — M6281 Muscle weakness (generalized): Secondary | ICD-10-CM | POA: Diagnosis not present

## 2023-09-15 DIAGNOSIS — M25511 Pain in right shoulder: Secondary | ICD-10-CM | POA: Diagnosis not present

## 2023-09-15 NOTE — Therapy (Signed)
 OUTPATIENT PHYSICAL THERAPY SHOULDER TREATMENT   Patient Name: Duane Cuevas MRN: [id number] DOB:27-Jun-1956, 68 y.o., male Today's Date: 09/15/2023  END OF SESSION:  PT End of Session - 09/15/23 1155     Visit Number 5    Number of Visits 17    Date for PT Re-Evaluation 11/03/23    Authorization Type [employer] MEDICARE HMO/PPO; MEDICAID OF [place]    PT Start Time 1146    PT Stop Time 1240    PT Time Calculation (min) 54 min    Activity Tolerance Patient tolerated treatment well    Behavior During Therapy WFL for tasks assessed/performed                 Past Medical History:  Diagnosis Date   Cerebral aneurysm, nonruptured    followed by dr n. 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   Coronary artery disease    (pt admitted for chest pain ) nuclear stress test 11-02-2019  low risk, nuclear ef 58%,  prior inferior defect of severe severity indicative of previous MI;;   echo 04-28-2020 ef 60-65%, mild concentric LVH, RSVP 54.80mmHg;;   cardiac CTA 09-15-2020  unremarkable,  calcium score=zero   Dependence on nocturnal oxygen therapy    02-27-2023  per pt daughter,  pt only uses at night w/ O2 sat ,2-3L via [place],  only uses portable if going outside when every hot which is not much   Diverticulosis of colon    DOE (dyspnea on exertion)    02-27-2023  per pt daugter sob w/ stairs, incline's,  ok w/ some yard work if not warm/ hot wearther,  can do house hold chores   ED (erectile dysfunction)    Emphysema/COPD (HCC)    followed by pcp;   uses breztri bid, uses preventil nebulizer every afternoon;   last exacerbation 06/ 2023 in epic   Full dentures    GERD (gastroesophageal reflux disease)    Hepatic steatosis    History of adenomatous polyp of colon    History of gastric ulcer    per EGD 09-16-2020  non-bleeding   History of MI (myocardial infarction)    per nuclear stress test in epic  11-02-2019 showed prior inferior defect of severe severity indicative of previous MI   History of pelvic fracture    1999  and 2009   Hyperlipidemia    Hypertension    Smokers' cough (HCC)    Type 2 diabetes mellitus treated with insulin (HCC)    followed by pcp;  dx 2016   (02-27-2023  per pt daughter pt   Wears glasses    Past Surgical History:  Procedure Laterality Date   BICEPT TENODESIS Right 08/08/2023   Procedure: BICEPS TENODESIS;  Surgeon: Cammy Copa, MD;  Location: [facility];  Service: Orthopedics;  Laterality: Right;   BIOPSY  09/16/2020   Procedure: BIOPSY;  Surgeon: Tressia Danas, MD;  Location: [facility];  Service: Gastroenterology;;   CHOLECYSTECTOMY N/A 03/20/2022   Procedure: LAPAROSCOPIC CHOLECYSTECTOMY;  Surgeon: Darnell Level, MD;  Location: [facility];  Service: General;  Laterality: N/A;   COLONOSCOPY WITH PROPOFOL  05/10/2018   dr Adela Lank   ESOPHAGOGASTRODUODENOSCOPY N/A 09/23/2015   Procedure: ESOPHAGOGASTRODUODENOSCOPY (EGD);  Surgeon: Meryl Dare, MD;  Location: [facility];  Service: Endoscopy;  Laterality: N/A;   ESOPHAGOGASTRODUODENOSCOPY (EGD) WITH PROPOFOL N/A 09/16/2020   Procedure: ESOPHAGOGASTRODUODENOSCOPY (EGD) WITH PROPOFOL;  Surgeon: Tressia Danas, MD;  Location: [facility];  Service: Gastroenterology;  Laterality: N/A;   EXCISION MASS NECK Left 03/09/2023   Procedure: EXCISION SOFT TISSUE MASS LEFT POSTERIOR NECK;  Surgeon: Darnell Level, MD;  Location: [facility];  Service: General;  Laterality: Left;   FOOT SURGERY Left 1999   INGUINAL HERNIA REPAIR Left 05/15/2018   Procedure: LEFT INGUINAL HERNIA REPAIR WITH MESH;  Surgeon: Harriette Bouillon, MD;  Location: [facility];  Service: General;  Laterality: Left;   INSERTION OF MESH Left 05/15/2018   Procedure: INSERTION OF MESH;  Surgeon: Harriette Bouillon, MD;  Location: [facility];  Service: General;  Laterality: Left;   INTRAOPERATIVE CHOLANGIOGRAM N/A 03/20/2022   Procedure: INTRAOPERATIVE  CHOLANGIOGRAM;  Surgeon: Darnell Level, MD;  Location: [facility];  Service: General;  Laterality: N/A;   REVERSE SHOULDER ARTHROPLASTY Left 08/30/2022   Procedure: LEFT REVERSE SHOULDER ARTHROPLASTY;  Surgeon: Cammy Copa, MD;  Location: [facility] OR;  Service: Orthopedics;  Laterality: Left;   REVERSE SHOULDER ARTHROPLASTY Right 08/08/2023   Procedure: RIGHT REVERSE SHOULDER ARTHROPLASTY;  Surgeon: Cammy Copa, MD;  Location: [facility] OR;  Service: Orthopedics;  Laterality: Right;   SHOULDER ARTHROSCOPY W/ ROTATOR CUFF REPAIR Left    yrs ago   Patient Active Problem List   Diagnosis Date Noted   Arthritis of right shoulder region 08/20/2023   S/P reverse total shoulder arthroplasty, right 08/08/2023   Hematoma of right shoulder 04/17/2023   Contusion of right shoulder 04/17/2023   Rotator cuff arthropathy, left 08/31/2022   S/P reverse total shoulder arthroplasty, left 08/30/2022   Intractable persistent migraine aura without cerebral infarction and with status migrainosus 05/26/2021   Hypertension associated with diabetes (HCC) 09/21/2020   Uncontrolled type 2 diabetes mellitus with hyperglycemia (HCC) 09/13/2020   CAD (coronary artery disease) 01/23/2020   HLD (hyperlipidemia) 11/02/2019   History of MI (myocardial infarction) 11/02/2019   COPD ? GOLD III/ active smoker 08/14/2018   COPD (chronic obstructive pulmonary disease) (HCC) 10/14/2014   Dyslipidemia associated with type 2 diabetes mellitus (HCC) 10/14/2014    PCP: Georgina Quint, MD   REFERRING PROVIDER: Cammy Copa, MD   REFERRING DIAG: [phone] (ICD-10-CM) - S/P reverse total shoulder arthroplasty, right on 08/08/23  THERAPY DIAG:  Right shoulder pain, unspecified chronicity  Muscle weakness  Localized edema  Rationale for Evaluation and Treatment: Rehabilitation  ONSET DATE: 08/08/23  SUBJECTIVE:                                                                                                                                                                                       SUBJECTIVE STATEMENT: Pt reports his R shoulder is doing well with him not currently  having pain. Sleeping is better.  EVAL: Pt reports his R shoulder is doing about as expected after surgery.   Hand dominance: Right  08/25/23 Ortho-Care Visit Plan: Duane Cuevas is a 68 y.o. male who presents s/p right reverse shoulder arthroplasty on 08/08/2023.  Patient is doing well and pain is overall controlled.  Using CPM machine as instructed.  Denies any chest pain, SOB, fevers, chills.  No complaint of any instability symptoms.  Pain is controlled.  Feels about 40% improved compared with time of surgery..     On exam, patient has range of motion 30 degrees X rotation, 90 degrees abduction, 120 degrees forward elevation passively.  Intact EPL, FPL, finger abduction, finger adduction, pronation/supination, bicep, tricep, deltoid of operative extremity.  Axillary nerve intact with deltoid firing.  Incision is healing well without evidence of infection or dehiscence.  Incision was made sure to be covered with Steri-Strips from the proximal to distal aspect of the length of the incision.  2+ radial pulse of the operative extremity   Plan is discontinue sling.  Okay to very lightly lift with the operative extremity but no lifting anything heavier than a coffee cup or cell phone.  Start physical therapy to focus on passive range of motion and active range of motion with deltoid isometrics.  Do not want to externally rotate past 30 degrees to protect subscapularis repair.  Follow-up in 4 weeks for clinical recheck with Dr. August Saucer.  PERTINENT HISTORY: Significant for cardiopulmonary disease, Hx L RTSA, Smokes, DM2  PAIN:  Are you having pain? Yes: NPRS scale: 3/10 Pain location: R shoulder Pain description: Ache, sharp Aggravating factors: Lying in bed Relieving factors: Medications and cold pack  PRECAUTIONS: R RTSA 2/27 with  subscap repair   RED FLAGS: None   WEIGHT BEARING RESTRICTIONS: Yes R RTSA 08/08/23  FALLS:  Has patient fallen in last 6 months? No  LIVING ENVIRONMENT: Lives with: lives with their family Lives in: House/apartment Able to access home  OCCUPATION: Retired  PLOF: Independent  PATIENT GOALS:Good use of his R shoulder/arm  NEXT MD VISIT: 09/22/23  OBJECTIVE:  Note: Objective measures were completed at Evaluation unless otherwise noted.  DIAGNOSTIC FINDINGS:  08/25/23: AP, scapular Y, axillary views of the shoulder reviewed.  Reverse shoulder  arthroplasty prosthesis in good position and alignment without any  complicating features.  There is no evidence of periprosthetic fracture,  dislocation, dissociation of the glenosphere.   PATIENT SURVEYS:  Duane Cuevas 49/55: 86%- severe disability  COGNITION: Overall cognitive status: Within functional limits for tasks assessed     SENSATION: Duane Cuevas  UPPER EXTREMITY ROM:   Passive ROM Right eval Left eval Rt 09/06/23 Rt 09/08/23 Rt 09/15/23  Shoulder flexion 75 limited by pain  100d AA 120d AA 135d  Shoulder extension       Shoulder Scaption 90 limited by pain   AA 115d AA 115d  Shoulder adduction       Shoulder internal rotation       Shoulder external rotation 30d limited by precautions. In 30d of abd    45d  Elbow flexion       Elbow extension       Wrist flexion       Wrist extension       Wrist ulnar deviation       Wrist radial deviation       Wrist pronation       Wrist supination       (Blank rows = not  tested)  UPPER EXTREMITY MMT:  NT MMT Right eval Left eval  Shoulder flexion    Shoulder extension    Shoulder abduction    Shoulder adduction    Shoulder internal rotation    Shoulder external rotation    Middle trapezius    Lower trapezius    Elbow flexion    Elbow extension    Wrist flexion    Wrist extension    Wrist ulnar deviation    Wrist radial deviation    Wrist pronation    Wrist  supination    Grip strength (lbs)    (Blank rows = not tested)   PALPATION:  TTP to the R peri-shoulder area                                                                                                                             TREATMENT DATE:  Duane Cuevas Adult PT Treatment:           5 weeks s/p Sx                                        DATE: 09/14/23 Therapeutic Exercise: Seated AAROM for shoulder flexion ad scaption on table x15, 5 to 10" stretches Supine AAROM for shoulder flexion c dowel  x10 Supine AAROM for shoulder scaption c dowel  x10 Supine AAROM for shoulder ER c dowel x10  to 40d at 30d abd Isometrics for flex, abd, ER x10 5" 50% effort Manual Therapy: PROM for shoulder flex, abd, ER to 40d at 30d of abd Modalities: Cold pack to r shoulder x10 min  [facility] Adult PT Treatment:        5 weeks s/p Sx                                         DATE: 09/12/23 Therapeutic Exercise: Seated AAROM for shoulder flexion ad scaption on table x15 Supine AAROM for shoulder flexion c dowel  x10 Supine AAROM for shoulder ER c dowel x10  to 40d at 30d abd Isometrics for flex, abd, ER x5 5" 50% effort Manual Therapy: PROM for shoulder flex, abd, ER to 40d at 30d of abd Modalities: Vaso to the R shoulder x15 mins, low pressure, 36dF  [facility] Adult PT Treatment:                                                DATE: 09/08/23 Therapeutic Exercise: Supine AAROM for shoulder flexion c dowel  x10 Supine AAROM for shoulder ER c dowel x10  to 30d at 30d abd Isometrics for flex, abd, ER x5 5" 50% effort Manual  Therapy: PROM for shoulder flex, abd, ER to 30d at 30d of abd Modalities: Vaso to the R shoulder x15 mins, low pressure, 36dF   PATIENT EDUCATION: Education details: Eval findings, POC, HEP, self care  Person educated: Patient Education method: Explanation, Demonstration, Tactile cues, and Verbal cues Education comprehension: verbalized understanding, returned demonstration, verbal cues  required, and tactile cues required  HOME EXERCISE PROGRAM: Access Code: [email or web address] URL: [email or web address] Date: 09/15/2023 Prepared by: Joellyn Rued  Exercises - Seated Shoulder Flexion Towel Slide at Table Top Full Range of Motion  - 3 x daily - 7 x weekly - 1 sets - 10 reps - 3 hold - Seated Shoulder Scaption Slide at Table Top with Forearm in Neutral  - 3 x daily - 7 x weekly - 1 sets - 10 reps - 3 hold - Isometric Shoulder Flexion at Wall  - 1 x daily - 7 x weekly - 1 sets - 10 reps - 5 hold - Isometric Shoulder Abduction at Wall  - 1 x daily - 7 x weekly - 1 sets - 10 reps - 5 hold - Isometric Shoulder External Rotation at Wall  - 1 x daily - 7 x weekly - 1 sets - 10 reps - 5 hold - Supine Shoulder Flexion with Dowel  - 3 x daily - 7 x weekly - 1 sets - 10 reps - 5 hold  ASSESSMENT:  CLINICAL IMPRESSION: Pt participated in PT for R shoulder rTSA rehab 5 weeks out from surgery for PROM, AAROM and submaximal isometric strengthening. Reps for isometrics were increased and tolerated. AAROM for shoulder flexion improved as well as for PROM for ER at 30d of abd to protocol limit of 45d. Pt is improving appropriately. Pt will continue to benefit from skilled PT to address impairments for improved R shoulder function.  OBJECTIVE IMPAIRMENTS: decreased ROM, decreased strength, increased edema, impaired UE functional use, and pain.   ACTIVITY LIMITATIONS: carrying, lifting, bed mobility, bathing, toileting, dressing, self feeding, reach over head, hygiene/grooming, and caring for others  PARTICIPATION LIMITATIONS: meal prep, cleaning, laundry, and driving  PERSONAL FACTORS: Past/current experiences, Time since onset of injury/illness/exacerbation, and 1-2 comorbidities: DM2, smokes  are also affecting patient's functional outcome.   REHAB POTENTIAL: Good  CLINICAL DECISION MAKING: Evolving/moderate complexity  EVALUATION COMPLEXITY: Moderate   GOALS:  SHORT TERM  GOALS: Target date: 09/22/23  Pt will be Ind in an initial HEP  Baseline: started Goal status: ONGOING  2.  Pt's R shoulder PROM will increase to flexion and scaption to 110d and ER, in 30d of abd, to 45d to progress rehabilitation Baseline: see flow sheets 09/15/23: see flow sheets Goal status: MET  LONG TERM GOALS: Target date: 11/03/23  Pt will be Ind in a final HEP to maintain achieved LOF Baseline:  Goal status: INITIAL  2.  Pt will report >/= 50% decrease in pain from evaluation for improved function and QOL Baseline: 7/10 Goal status: INITIAL  3.  Pt will demonstrate >120 degrees of active ROM in flexion to allow completion of activities involving reaching OH, not limited by pain Baseline:  Goal status: INITIAL  4.  Jedidiah will be able to reach to cabinet repeatedly with at least 3#, not limited by pain Baseline:  Goal status: INITIAL  5. Pt's Quick Dash score will improve to 50% or less as indication of improved function  Baseline: 86% Goal status: INITIAL    PLAN:  PT FREQUENCY: 2x/week  PT DURATION: 8 weeks  PLANNED INTERVENTIONS: [id number]-  PT Re-evaluation, 97110-Therapeutic exercises, 97530- Therapeutic activity, [id number]- Neuromuscular re-education, 97535- Self Care, [id number]- Manual therapy, [id number]- Electrical stimulation (unattended), [id number]- Electrical stimulation (manual), [id number]- Ultrasound, Patient/Family education, Taping, Dry Needling, Joint mobilization, Cryotherapy, and Moist heat  PLAN FOR NEXT SESSION: Assess response to HEP; progress therex as indicated; use of modalities, manual therapy; and TPDN as indicated.    Albino Bufford MS, PT 09/15/23 3:48 PM

## 2023-09-19 NOTE — Therapy (Signed)
 OUTPATIENT PHYSICAL THERAPY SHOULDER TREATMENT   Patient Name: [facility] MRN: [id number] DOB:05-Feb-1956, 68 y.o., male Today's Date: 09/20/2023  END OF SESSION:  PT End of Session - 09/20/23 1101     Visit Number 6    Number of Visits 17    Date for PT Re-Evaluation 11/03/23    Authorization Type [employer] MEDICARE HMO/PPO; MEDICAID OF [place]    PT Start Time 1101    PT Stop Time 1151    PT Time Calculation (min) 50 min    Activity Tolerance Patient tolerated treatment well    Behavior During Therapy WFL for tasks assessed/performed                  Past Medical History:  Diagnosis Date   Cerebral aneurysm, nonruptured    followed by dr n. 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   Coronary artery disease    (pt admitted for chest pain ) nuclear stress test 11-02-2019  low risk, nuclear ef 58%,  prior inferior defect of severe severity indicative of previous MI;;   echo 04-28-2020 ef 60-65%, mild concentric LVH, RSVP 54.77mmHg;;   cardiac CTA 09-15-2020  unremarkable,  calcium score=zero   Dependence on nocturnal oxygen therapy    02-27-2023  per pt daughter,  pt only uses at night w/ O2 sat ,2-3L via [place],  only uses portable if going outside when every hot which is not much   Diverticulosis of colon    DOE (dyspnea on exertion)    02-27-2023  per pt daugter sob w/ stairs, incline's,  ok w/ some yard work if not warm/ hot wearther,  can do house hold chores   ED (erectile dysfunction)    Emphysema/COPD (HCC)    followed by pcp;   uses breztri bid, uses preventil nebulizer every afternoon;   last exacerbation 06/ 2023 in epic   Full dentures    GERD (gastroesophageal reflux disease)    Hepatic steatosis    History of adenomatous polyp of colon    History of gastric ulcer    per EGD 09-16-2020  non-bleeding   History of MI (myocardial infarction)    per nuclear stress test in epic  11-02-2019 showed prior inferior defect of severe severity indicative of previous MI   History of pelvic fracture    1999  and 2009   Hyperlipidemia    Hypertension    Smokers' cough (HCC)    Type 2 diabetes mellitus treated with insulin (HCC)    followed by pcp;  dx 2016   (02-27-2023  per pt daughter pt   Wears glasses    Past Surgical History:  Procedure Laterality Date   BICEPT TENODESIS Right 08/08/2023   Procedure: BICEPS TENODESIS;  Surgeon: Cammy Copa, MD;  Location: [facility];  Service: Orthopedics;  Laterality: Right;   BIOPSY  09/16/2020   Procedure: BIOPSY;  Surgeon: Tressia Danas, MD;  Location: [facility];  Service: Gastroenterology;;   CHOLECYSTECTOMY N/A 03/20/2022   Procedure: LAPAROSCOPIC CHOLECYSTECTOMY;  Surgeon: Darnell Level, MD;  Location: [facility];  Service: General;  Laterality: N/A;   COLONOSCOPY WITH PROPOFOL  05/10/2018   dr Adela Lank   ESOPHAGOGASTRODUODENOSCOPY N/A 09/23/2015   Procedure: ESOPHAGOGASTRODUODENOSCOPY (EGD);  Surgeon: Meryl Dare, MD;  Location: [facility];  Service: Endoscopy;  Laterality: N/A;   ESOPHAGOGASTRODUODENOSCOPY (EGD) WITH PROPOFOL N/A 09/16/2020   Procedure: ESOPHAGOGASTRODUODENOSCOPY (EGD) WITH PROPOFOL;  Surgeon: Tressia Danas, MD;  Location:  [facility];  Service: Gastroenterology;  Laterality: N/A;   EXCISION MASS NECK Left 03/09/2023   Procedure: EXCISION SOFT TISSUE MASS LEFT POSTERIOR NECK;  Surgeon: Darnell Level, MD;  Location: [facility];  Service: General;  Laterality: Left;   FOOT SURGERY Left 1999   INGUINAL HERNIA REPAIR Left 05/15/2018   Procedure: LEFT INGUINAL HERNIA REPAIR WITH MESH;  Surgeon: Harriette Bouillon, MD;  Location: [facility];  Service: General;  Laterality: Left;   INSERTION OF MESH Left 05/15/2018   Procedure: INSERTION OF MESH;  Surgeon: Harriette Bouillon, MD;  Location: [facility];  Service: General;  Laterality: Left;   INTRAOPERATIVE CHOLANGIOGRAM N/A 03/20/2022   Procedure: INTRAOPERATIVE  CHOLANGIOGRAM;  Surgeon: Darnell Level, MD;  Location: [facility];  Service: General;  Laterality: N/A;   REVERSE SHOULDER ARTHROPLASTY Left 08/30/2022   Procedure: LEFT REVERSE SHOULDER ARTHROPLASTY;  Surgeon: Cammy Copa, MD;  Location: [facility];  Service: Orthopedics;  Laterality: Left;   REVERSE SHOULDER ARTHROPLASTY Right 08/08/2023   Procedure: RIGHT REVERSE SHOULDER ARTHROPLASTY;  Surgeon: Cammy Copa, MD;  Location: [facility];  Service: Orthopedics;  Laterality: Right;   SHOULDER ARTHROSCOPY W/ ROTATOR CUFF REPAIR Left    yrs ago   Patient Active Problem List   Diagnosis Date Noted   Arthritis of right shoulder region 08/20/2023   S/P reverse total shoulder arthroplasty, right 08/08/2023   Hematoma of right shoulder 04/17/2023   Contusion of right shoulder 04/17/2023   Rotator cuff arthropathy, left 08/31/2022   S/P reverse total shoulder arthroplasty, left 08/30/2022   Intractable persistent migraine aura without cerebral infarction and with status migrainosus 05/26/2021   Hypertension associated with diabetes (HCC) 09/21/2020   Uncontrolled type 2 diabetes mellitus with hyperglycemia (HCC) 09/13/2020   CAD (coronary artery disease) 01/23/2020   HLD (hyperlipidemia) 11/02/2019   History of MI (myocardial infarction) 11/02/2019   COPD ? GOLD III/ active smoker 08/14/2018   COPD (chronic obstructive pulmonary disease) (HCC) 10/14/2014   Dyslipidemia associated with type 2 diabetes mellitus (HCC) 10/14/2014    PCP: Georgina Quint, MD   REFERRING PROVIDER: Cammy Copa, MD   REFERRING DIAG: [phone] (ICD-10-CM) - S/P reverse total shoulder arthroplasty, right on 08/08/23  THERAPY DIAG:  Right shoulder pain, unspecified chronicity  Muscle weakness  Localized edema  Rationale for Evaluation and Treatment: Rehabilitation  ONSET DATE: 08/08/23  SUBJECTIVE:                                                                                                                                                                                       SUBJECTIVE STATEMENT: Pt reports his R shoulder varies throughout the day, Currently he  is not having pain.  EVAL: Pt reports his R shoulder is doing about as expected after surgery.   Hand dominance: Right  08/25/23 Ortho-Care Visit Plan: [facility] is a 69 y.o. male who presents s/p right reverse shoulder arthroplasty on 08/08/2023.  Patient is doing well and pain is overall controlled.  Using CPM machine as instructed.  Denies any chest pain, SOB, fevers, chills.  No complaint of any instability symptoms.  Pain is controlled.  Feels about 40% improved compared with time of surgery..     On exam, patient has range of motion 30 degrees X rotation, 90 degrees abduction, 120 degrees forward elevation passively.  Intact EPL, FPL, finger abduction, finger adduction, pronation/supination, bicep, tricep, deltoid of operative extremity.  Axillary nerve intact with deltoid firing.  Incision is healing well without evidence of infection or dehiscence.  Incision was made sure to be covered with Steri-Strips from the proximal to distal aspect of the length of the incision.  2+ radial pulse of the operative extremity   Plan is discontinue sling.  Okay to very lightly lift with the operative extremity but no lifting anything heavier than a coffee cup or cell phone.  Start physical therapy to focus on passive range of motion and active range of motion with deltoid isometrics.  Do not want to externally rotate past 30 degrees to protect subscapularis repair.  Follow-up in 4 weeks for clinical recheck with Dr. August Saucer.  PERTINENT HISTORY: Significant for cardiopulmonary disease, Hx L RTSA, Smokes, DM2  PAIN:  Are you having pain? Yes: NPRS scale: 3/10 Pain location: R shoulder Pain description: Ache, sharp Aggravating factors: Lying in bed Relieving factors: Medications and cold pack  PRECAUTIONS: R RTSA 2/27 with subscap repair    RED FLAGS: None   WEIGHT BEARING RESTRICTIONS: Yes R RTSA 08/08/23  FALLS:  Has patient fallen in last 6 months? No  LIVING ENVIRONMENT: Lives with: lives with their family Lives in: House/apartment Able to access home  OCCUPATION: Retired  PLOF: Independent  PATIENT GOALS:Good use of his R shoulder/arm  NEXT MD VISIT: 09/22/23  OBJECTIVE:  Note: Objective measures were completed at Evaluation unless otherwise noted.  DIAGNOSTIC FINDINGS:  08/25/23: AP, scapular Y, axillary views of the shoulder reviewed.  Reverse shoulder  arthroplasty prosthesis in good position and alignment without any  complicating features.  There is no evidence of periprosthetic fracture,  dislocation, dissociation of the glenosphere.   PATIENT SURVEYS:  Duane [facility] 49/55: 86%- severe disability  COGNITION: Overall cognitive status: Within functional limits for tasks assessed     SENSATION: [facility]  UPPER EXTREMITY ROM:   Passive ROM Right eval Left eval Rt 09/06/23 Rt 09/08/23 Rt 09/15/23  Shoulder flexion 75 limited by pain  100d AA 120d AA 135d  Shoulder extension       Shoulder Scaption 90 limited by pain   AA 115d AA 115d  Shoulder adduction       Shoulder internal rotation       Shoulder external rotation 30d limited by precautions. In 30d of abd    45d  Elbow flexion       Elbow extension       Wrist flexion       Wrist extension       Wrist ulnar deviation       Wrist radial deviation       Wrist pronation       Wrist supination       (Blank rows = not tested)  UPPER EXTREMITY MMT:  NT MMT Right eval Left eval  Shoulder flexion    Shoulder extension    Shoulder abduction    Shoulder adduction    Shoulder internal rotation    Shoulder external rotation    Middle trapezius    Lower trapezius    Elbow flexion    Elbow extension    Wrist flexion    Wrist extension    Wrist ulnar deviation    Wrist radial deviation    Wrist pronation    Wrist supination    Grip  strength (lbs)    (Blank rows = not tested)   PALPATION:  TTP to the R peri-shoulder area                                                                                                                             TREATMENT DATE:  [facility] Adult PT Treatment:              6 weeks s/p Sx                                DATE: 09/20/23 Therapeutic Exercise: Standing AAROM for shoulder flexion and scaption with pball on counter x10 Supine AAROM for shoulder flexion and scaption c dowel  x10, the x2 10' stretcg Supine AAROM for shoulder ER c dowel x10  to 40d at 30d abd Scapular retraction x10 Shoulder/scapular row 2x10 RTB Shoulder/scapular ext 2x10 RTB Isometrics for flex, abd, ER x10 5" 50% effort Manual Therapy: PROM for shoulder flex, abd, ER to 40d at 30d of abd Modalities: Cold pack to R shoulder x10 min  [facility] Adult PT Treatment:           5 weeks s/p Sx                                        DATE: 09/14/23 Therapeutic Exercise: Seated AAROM for shoulder flexion ad scaption on table x15, 5 to 10" stretches Supine AAROM for shoulder flexion c dowel  x10 Supine AAROM for shoulder scaption c dowel  x10 Supine AAROM for shoulder ER c dowel x10  to 40d at 30d abd Isometrics for flex, abd, ER x10 5" 50% effort Manual Therapy: PROM for shoulder flex, abd, ER to 40d at 30d of abd Modalities: Cold pack to R shoulder x10 min  [facility] Adult PT Treatment:        5 weeks s/p Sx                                         DATE: 09/12/23 Therapeutic Exercise: Seated AAROM for shoulder flexion ad scaption on table x15 Supine AAROM for shoulder flexion c dowel  x10 Supine AAROM for shoulder ER c dowel x10  to 40d at 30d abd Isometrics for flex, abd, ER x5 5" 50% effort Manual Therapy: PROM for shoulder flex, abd, ER to 40d at 30d of abd Modalities: Vaso to the R shoulder x15 mins, low pressure, 36dF  [facility] Adult PT Treatment:                                                DATE: 09/08/23 Therapeutic  Exercise: Supine AAROM for shoulder flexion c dowel  x10 Supine AAROM for shoulder ER c dowel x10  to 30d at 30d abd Isometrics for flex, abd, ER x5 5" 50% effort Manual Therapy: PROM for shoulder flex, abd, ER to 30d at 30d of abd Modalities: Vaso to the R shoulder x15 mins, low pressure, 36dF   PATIENT EDUCATION: Education details: Eval findings, POC, HEP, self care  Person educated: Patient Education method: Explanation, Demonstration, Tactile cues, and Verbal cues Education comprehension: verbalized understanding, returned demonstration, verbal cues required, and tactile cues required  HOME EXERCISE PROGRAM: Access Code: [email or web address] URL: [email or web address] Date: 09/15/2023 Prepared by: Joellyn Rued  Exercises - Seated Shoulder Flexion Towel Slide at Table Top Full Range of Motion  - 3 x daily - 7 x weekly - 1 sets - 10 reps - 3 hold - Seated Shoulder Scaption Slide at Table Top with Forearm in Neutral  - 3 x daily - 7 x weekly - 1 sets - 10 reps - 3 hold - Isometric Shoulder Flexion at Wall  - 1 x daily - 7 x weekly - 1 sets - 10 reps - 5 hold - Isometric Shoulder Abduction at Wall  - 1 x daily - 7 x weekly - 1 sets - 10 reps - 5 hold - Isometric Shoulder External Rotation at Wall  - 1 x daily - 7 x weekly - 1 sets - 10 reps - 5 hold - Supine Shoulder Flexion with Dowel  - 3 x daily - 7 x weekly - 1 sets - 10 reps - 5 hold  ASSESSMENT:  CLINICAL IMPRESSION: Pt participated in PT for R shoulder rTSA rehab 6 weeks s/p Sx for PROM, AAROM, submaximal isometrics, and with scapular strengthening initiated. Pt tolerated the prescribed therapy without adverse effects. Pt will continue to benefit from skilled PT to address impairments for improved R shoulder function.  OBJECTIVE IMPAIRMENTS: decreased ROM, decreased strength, increased edema, impaired UE functional use, and pain.   ACTIVITY LIMITATIONS: carrying, lifting, bed mobility, bathing, toileting, dressing, self  feeding, reach over head, hygiene/grooming, and caring for others  PARTICIPATION LIMITATIONS: meal prep, cleaning, laundry, and driving  PERSONAL FACTORS: Past/current experiences, Time since onset of injury/illness/exacerbation, and 1-2 comorbidities: DM2, smokes  are also affecting patient's functional outcome.   REHAB POTENTIAL: Good  CLINICAL DECISION MAKING: Evolving/moderate complexity  EVALUATION COMPLEXITY: Moderate   GOALS:  SHORT TERM GOALS: Target date: 09/22/23  Pt will be Ind in an initial HEP  Baseline: started Goal status: ONGOING  2.  Pt's R shoulder PROM will increase to flexion and scaption to 110d and ER, in 30d of abd, to 45d to progress rehabilitation Baseline: see flow sheets 09/15/23: see flow sheets Goal status: MET  LONG TERM GOALS: Target date: 11/03/23  Pt will be Ind in a final HEP to maintain achieved LOF Baseline:  Goal status: INITIAL  2.  Pt will report >/= 50% decrease in pain from evaluation for improved function and QOL Baseline: 7/10 Goal status: INITIAL  3.  Pt will demonstrate >120 degrees of active ROM in flexion to allow completion of activities involving reaching OH, not limited by pain Baseline:  Goal status: INITIAL  4.  [facility] will be able to reach to cabinet repeatedly with at least 3#, not limited by pain Baseline:  Goal status: INITIAL  5. Pt's Quick Dash score will improve to 50% or less as indication of improved function  Baseline: 86% Goal status: INITIAL    PLAN:  PT FREQUENCY: 2x/week  PT DURATION: 8 weeks  PLANNED INTERVENTIONS: [id number]- PT Re-evaluation, 97110-Therapeutic exercises, 97530- Therapeutic activity, [id number]- Neuromuscular re-education, 97535- Self Care, [id number]- Manual therapy, [id number]- Electrical stimulation (unattended), [id number]- Electrical stimulation (manual), 97035- Ultrasound, Patient/Family education, Taping, Dry Needling, Joint mobilization, Cryotherapy, and Moist heat  PLAN FOR NEXT SESSION: Assess  response to HEP; progress therex as indicated; use of modalities, manual therapy; and TPDN as indicated.    Duane Stoffers MS, PT 09/20/23 11:47 AM

## 2023-09-20 ENCOUNTER — Ambulatory Visit: Payer: Medicare HMO

## 2023-09-20 DIAGNOSIS — M6281 Muscle weakness (generalized): Secondary | ICD-10-CM | POA: Diagnosis not present

## 2023-09-20 DIAGNOSIS — M25511 Pain in right shoulder: Secondary | ICD-10-CM

## 2023-09-20 DIAGNOSIS — R600 Localized edema: Secondary | ICD-10-CM

## 2023-09-21 NOTE — Therapy (Signed)
 OUTPATIENT PHYSICAL THERAPY SHOULDER TREATMENT   Patient Name: Duane [facility] MRN: [id number] DOB:[birth year], 68 y.o., male Today's Date: 09/22/2023  END OF SESSION:  PT End of Session - 09/22/23 1143     Visit Number 7    Number of Visits 17    Date for PT Re-Evaluation 11/03/23    Authorization Type [employer] MEDICARE HMO/PPO; MEDICAID OF [place]    PT Start Time 1100    PT Stop Time 1145    PT Time Calculation (min) 45 min    Activity Tolerance Patient tolerated treatment well    Behavior During Therapy WFL for tasks assessed/performed                   Past Medical History:  Diagnosis Date   Cerebral aneurysm, nonruptured    followed by dr n. Duane [facility] neurosurgery);  last MRI in epic 06-13-2022 stable 2 mm left paraophthalmia ICA intracerebral aneurysm   Coronary artery disease    (pt admitted for chest pain ) nuclear stress test 11-02-2019  low risk, nuclear ef 58%,  prior inferior defect of severe severity indicative of previous MI;;   echo 04-28-2020 ef 60-65%, mild concentric LVH, RSVP 54.51mmHg;;   cardiac CTA 09-15-2020  unremarkable,  calcium score=zero   Dependence on nocturnal oxygen therapy    02-27-2023  per pt daughter,  pt only uses at night w/ O2 sat ,2-3L via [place],  only uses portable if going outside when every hot which is not much   Diverticulosis of colon    DOE (dyspnea on exertion)    02-27-2023  per pt daugter sob w/ stairs, incline's,  ok w/ some yard work if not warm/ hot wearther,  can do house hold chores   ED (erectile dysfunction)    Emphysema/COPD (HCC)    followed by pcp;   uses breztri bid, uses preventil nebulizer every afternoon;   last exacerbation 06/ 2023 in epic   Full dentures    GERD (gastroesophageal reflux disease)    Hepatic steatosis    History of adenomatous polyp of colon    History of gastric ulcer    per EGD 09-16-2020  non-bleeding   History of MI (myocardial infarction)    per nuclear stress test in epic  11-02-2019 showed prior inferior defect of severe severity indicative of previous MI   History of pelvic fracture    1999  and 2009   Hyperlipidemia    Hypertension    Smokers' cough (HCC)    Type 2 diabetes mellitus treated with insulin (HCC)    followed by pcp;  dx 2016   (02-27-2023  per pt daughter pt   Wears glasses    Past Surgical History:  Procedure Laterality Date   BICEPT TENODESIS Right 08/08/2023   Procedure: BICEPS TENODESIS;  Surgeon: Cammy Copa, MD;  Location: [facility] OR;  Service: Orthopedics;  Laterality: Right;   BIOPSY  09/16/2020   Procedure: BIOPSY;  Surgeon: Tressia Danas, MD;  Location: [facility];  Service: Gastroenterology;;   CHOLECYSTECTOMY N/A 03/20/2022   Procedure: LAPAROSCOPIC CHOLECYSTECTOMY;  Surgeon: Darnell Level, MD;  Location: [facility];  Service: General;  Laterality: N/A;   COLONOSCOPY WITH PROPOFOL  05/10/2018   dr Adela Lank   ESOPHAGOGASTRODUODENOSCOPY N/A 09/23/2015   Procedure: ESOPHAGOGASTRODUODENOSCOPY (EGD);  Surgeon: Meryl Dare, MD;  Location: [facility];  Service: Endoscopy;  Laterality: N/A;   ESOPHAGOGASTRODUODENOSCOPY (EGD) WITH PROPOFOL N/A 09/16/2020   Procedure: ESOPHAGOGASTRODUODENOSCOPY (EGD) WITH PROPOFOL;  Surgeon: Tressia Danas, MD;  Location: [facility];  Service: Gastroenterology;  Laterality: N/A;   EXCISION MASS NECK Left 03/09/2023   Procedure: EXCISION SOFT TISSUE MASS LEFT POSTERIOR NECK;  Surgeon: Darnell Level, MD;  Location: [facility];  Service: General;  Laterality: Left;   FOOT SURGERY Left 1999   INGUINAL HERNIA REPAIR Left 05/15/2018   Procedure: LEFT INGUINAL HERNIA REPAIR WITH MESH;  Surgeon: Harriette Bouillon, MD;  Location: [facility];  Service: General;  Laterality: Left;   INSERTION OF MESH Left 05/15/2018   Procedure: INSERTION OF MESH;  Surgeon: Harriette Bouillon, MD;  Location: [facility];  Service: General;  Laterality: Left;   INTRAOPERATIVE CHOLANGIOGRAM N/A 03/20/2022   Procedure: INTRAOPERATIVE  CHOLANGIOGRAM;  Surgeon: Darnell Level, MD;  Location: [facility];  Service: General;  Laterality: N/A;   REVERSE SHOULDER ARTHROPLASTY Left 08/30/2022   Procedure: LEFT REVERSE SHOULDER ARTHROPLASTY;  Surgeon: Cammy Copa, MD;  Location: [facility];  Service: Orthopedics;  Laterality: Left;   REVERSE SHOULDER ARTHROPLASTY Right 08/08/2023   Procedure: RIGHT REVERSE SHOULDER ARTHROPLASTY;  Surgeon: Cammy Copa, MD;  Location[facility] OR;  Service: Orthopedics;  Laterality: Right;   SHOULDER ARTHROSCOPY W/ ROTATOR CUFF REPAIR Left    yrs ago   Patient Active Problem List   Diagnosis Date Noted   Arthritis of right shoulder region 08/20/2023   S/P reverse total shoulder arthroplasty, right 08/08/2023   Hematoma of right shoulder 04/17/2023   Contusion of right shoulder 04/17/2023   Rotator cuff arthropathy, left 08/31/2022   S/P reverse total shoulder arthroplasty, left 08/30/2022   Intractable persistent migraine aura without cerebral infarction and with status migrainosus 05/26/2021   Hypertension associated with diabetes (HCC) 09/21/2020   Uncontrolled type 2 diabetes mellitus with hyperglycemia (HCC) 09/13/2020   CAD (coronary artery disease) 01/23/2020   HLD (hyperlipidemia) 11/02/2019   History of MI (myocardial infarction) 11/02/2019   COPD ? GOLD III/ active smoker 08/14/2018   COPD (chronic obstructive pulmonary disease) (HCC) 10/14/2014   Dyslipidemia associated with type 2 diabetes mellitus (HCC) 10/14/2014    PCP: Georgina Quint, MD   REFERRING PROVIDER: Cammy Copa, MD   REFERRING DIAG: [phone] (ICD-10-CM) - S/P reverse total shoulder arthroplasty, right on 08/08/23  THERAPY DIAG:  Right shoulder pain, unspecified chronicity  Muscle weakness  Localized edema  Rationale for Evaluation and Treatment: Rehabilitation  ONSET DATE: 08/08/23  SUBJECTIVE:                                                                                                                                                                                       SUBJECTIVE STATEMENT: Pt reports his R shoulder is doing well no issues.  Has f[facility]u appt at [facility] today  EVAL: Pt reports his R shoulder is doing about as expected after surgery.   Hand dominance: Right  08/25/23 Ortho-Care Visit Plan: [facility] is a 68 y.o. male who presents s/p right reverse shoulder arthroplasty on 08/08/2023.  Patient is doing well and pain is overall controlled.  Using CPM machine as instructed.  Denies any chest pain, SOB, fevers, chills.  No complaint of any instability symptoms.  Pain is controlled.  Feels about 40% improved compared with time of surgery..     On exam, patient has range of motion 30 degrees X rotation, 90 degrees abduction, 120 degrees forward elevation passively.  Intact EPL, FPL, finger abduction, finger adduction, pronation/supination, bicep, tricep, deltoid of operative extremity.  Axillary nerve intact with deltoid firing.  Incision is healing well without evidence of infection or dehiscence.  Incision was made sure to be covered with Steri-Strips from the proximal to distal aspect of the length of the incision.  2+ radial pulse of the operative extremity   Plan is discontinue sling.  Okay to very lightly lift with the operative extremity but no lifting anything heavier than a coffee cup or cell phone.  Start physical therapy to focus on passive range of motion and active range of motion with deltoid isometrics.  Do not want to externally rotate past 30 degrees to protect subscapularis repair.  Follow-up in 4 weeks for clinical recheck with Dr. August Saucer.  PERTINENT HISTORY: Significant for cardiopulmonary disease, Hx L RTSA, Smokes, DM2  PAIN:  Are you having pain? Yes: NPRS scale: 0/10- with pain medication Pain location: R shoulder Pain description: Ache, sharp Aggravating factors: Lying in bed Relieving factors: Medications and cold pack  PRECAUTIONS: R RTSA 2/27  with subscap repair   RED FLAGS: None   WEIGHT BEARING RESTRICTIONS: Yes R RTSA 08/08/23  FALLS:  Has patient fallen in last 6 months? No  LIVING ENVIRONMENT: Lives with: lives with their family Lives in: House/apartment Able to access home  OCCUPATION: Retired  PLOF: Independent  PATIENT GOALS:Good use of his R shoulder/arm  NEXT MD VISIT: 09/22/23  OBJECTIVE:  Note: Objective measures were completed at Evaluation unless otherwise noted.  DIAGNOSTIC FINDINGS:  08/25/23: AP, scapular Y, axillary views of the shoulder reviewed.  Reverse shoulder  arthroplasty prosthesis in good position and alignment without any  complicating features.  There is no evidence of periprosthetic fracture,  dislocation, dissociation of the glenosphere.   PATIENT SURVEYS:  Duane [facility] 49/55: 86%- severe disability  COGNITION: Overall cognitive status: Within functional limits for tasks assessed     SENSATION: [facility]  UPPER EXTREMITY ROM:   Passive ROM Right eval Left eval Rt 09/06/23 Rt 09/08/23 Rt 09/15/23 RT 09/22/23  Shoulder flexion 75 limited by pain  100d AA 120d AA 135d AA 155  Shoulder extension        Shoulder Scaption 90 limited by pain   AA 115d AA 115d   Shoulder adduction        Shoulder internal rotation        Shoulder external rotation 30d limited by precautions. In 30d of abd    45d 45d  Elbow flexion        Elbow extension        Wrist flexion        Wrist extension        Wrist ulnar deviation        Wrist radial deviation        Wrist  pronation        Wrist supination        (Blank rows = not tested)  UPPER EXTREMITY MMT:  NT MMT Right eval Left eval  Shoulder flexion    Shoulder extension    Shoulder abduction    Shoulder adduction    Shoulder internal rotation    Shoulder external rotation    Middle trapezius    Lower trapezius    Elbow flexion    Elbow extension    Wrist flexion    Wrist extension    Wrist ulnar deviation    Wrist radial  deviation    Wrist pronation    Wrist supination    Grip strength (lbs)    (Blank rows = not tested)   PALPATION:  TTP to the R peri-shoulder area                                                                                                                             TREATMENT DATE:  [facility] Adult PT Treatment:                   6 weeks s/p Sx                             DATE: 09/22/23 Therapeutic Exercise: Standing AAROM for shoulder flexion and scaption with pball on counter x10 Supine AAROM for shoulder flexion and scaption c dowel  x10, the x2 10' stretching Supine AAROM for shoulder ER c dowel x10  to 40d at 30d abd Supine AROM for flexion x10 S/L abd in neutral x10 S/L ER in scapular plane x10 Scapular retraction x10 Shoulder/scapular row 2x10 RTB Shoulder/scapular ext 2x10 RTB Isometrics for flex, abd, ER x10 5" 50% effort Manual Therapy: PROM for shoulder flex, abd, ER to 45d at 30d of abd  [facility] Adult PT Treatment:                                              DATE: 09/20/23 Therapeutic Exercise: Standing AAROM for shoulder flexion and scaption with pball on counter x10 Supine AAROM for shoulder flexion and scaption c dowel  x10, the x2 10' stretcg Supine AAROM for shoulder ER c dowel x10  to 40d at 30d abd Scapular retraction x10 Shoulder/scapular row 2x10 RTB Shoulder/scapular ext 2x10 RTB Isometrics for flex, abd, ER x10 5" 50% effort Manual Therapy: PROM for shoulder flex, abd, ER to 40d at 30d of abd Modalities: Cold pack to R shoulder x10 min  PATIENT EDUCATION: Education details: Eval findings, POC, HEP, self care  Person educated: Patient Education method: Explanation, Demonstration, Tactile cues, and Verbal cues Education comprehension: verbalized understanding, returned demonstration, verbal cues required, and tactile cues required  HOME EXERCISE PROGRAM: Access Code: [email or web address] URL: [email or web address] Date:  09/22/2023 Prepared by:  Joellyn Rued  Exercises - Seated Shoulder Flexion Towel Slide at Table Top Full Range of Motion  - 1 x daily - 7 x weekly - 1 sets - 10 reps - 3 hold - Seated Shoulder Scaption Slide at Table Top with Forearm in Neutral  - 7 x weekly - 1 sets - 10 reps - 3 hold - Isometric Shoulder Flexion at Wall  - 1 x daily - 7 x weekly - 1 sets - 10 reps - 5 hold - Isometric Shoulder Abduction at Wall  - 1 x daily - 7 x weekly - 1 sets - 10 reps - 5 hold - Isometric Shoulder External Rotation at Wall  - 1 x daily - 7 x weekly - 1 sets - 10 reps - 5 hold - Supine Shoulder Flexion with Dowel  - 2 x daily - 7 x weekly - 1 sets - 10 reps - 5 hold - Supine Shoulder Flexion Extension Full Range AROM  - 2 x daily - 7 x weekly - 1 sets - 10 reps - Standing Bilateral Low Shoulder Row with Anchored Resistance  - 1 x daily - 7 x weekly - 2 sets - 10 reps - 3 hold - Shoulder Extension with Resistance  - 1 x daily - 7 x weekly - 2 sets - 10 reps - 3 hold  ASSESSMENT:  CLINICAL IMPRESSION: Pt participated in PT for R shoulder rTSA rehab 6 weeks s/p Sx for PROM, AAROM, AROM, submaximal isometrics, and scapular strengthening. AROM was initiated today. Pt demonstrates good R shoulder AROM for flexion both in supine and standing. Pt tolerated the prescribed therapy without adverse effects. Pt is make appropriate progress re: ROM and strength. Pt will continue to benefit from skilled PT to address impairments for improved R shoulder function.  OBJECTIVE IMPAIRMENTS: decreased ROM, decreased strength, increased edema, impaired UE functional use, and pain.   ACTIVITY LIMITATIONS: carrying, lifting, bed mobility, bathing, toileting, dressing, self feeding, reach over head, hygiene/grooming, and caring for others  PARTICIPATION LIMITATIONS: meal prep, cleaning, laundry, and driving  PERSONAL FACTORS: Past/current experiences, Time since onset of injury/illness/exacerbation, and 1-2 comorbidities: DM2, smokes  are also affecting  patient's functional outcome.   REHAB POTENTIAL: Good  CLINICAL DECISION MAKING: Evolving/moderate complexity  EVALUATION COMPLEXITY: Moderate   GOALS:  SHORT TERM GOALS: Target date: 09/22/23  Pt will be Ind in an initial HEP  Baseline: started Goal status: MET  2.  Pt's R shoulder PROM will increase to flexion and scaption to 110d and ER, in 30d of abd, to 45d to progress rehabilitation Baseline: see flow sheets 09/15/23: see flow sheets Goal status: MET  LONG TERM GOALS: Target date: 11/03/23  Pt will be Ind in a final HEP to maintain achieved LOF Baseline:  Goal status: INITIAL  2.  Pt will report >/= 50% decrease in pain from evaluation for improved function and QOL Baseline: 7/10 Goal status: INITIAL  3.  Pt will demonstrate >120 degrees of active ROM in flexion to allow completion of activities involving reaching OH, not limited by pain Baseline:  09/22/23:Flexion=155d Goal status: IMPROVING  4.  [facility] will be able to reach to cabinet repeatedly with at least 3#, not limited by pain Baseline:  Goal status: INITIAL  5. Pt's Quick Dash score will improve to 50% or less as indication of improved function  Baseline: 86% Goal status: INITIAL    PLAN:  PT FREQUENCY: 2x/week  PT DURATION: 8 weeks  PLANNED INTERVENTIONS:  [id number]- PT Re-evaluation, 97110-Therapeutic exercises, 97530- Therapeutic activity, [id number]- Neuromuscular re-education, 97535- Self Care, [id number]- Manual therapy, [id number]- Electrical stimulation (unattended), [id number]- Electrical stimulation (manual), [id number]- Ultrasound, Patient/Family education, Taping, Dry Needling, Joint mobilization, Cryotherapy, and Moist heat  PLAN FOR NEXT SESSION: Assess response to HEP; progress therex as indicated; use of modalities, manual therapy; and TPDN as indicated.    Samari Bittinger MS, PT 09/22/23 12:03 PM

## 2023-09-22 ENCOUNTER — Encounter: Payer: Self-pay | Admitting: Surgical

## 2023-09-22 ENCOUNTER — Ambulatory Visit (INDEPENDENT_AMBULATORY_CARE_PROVIDER_SITE_OTHER): Payer: Medicare HMO | Admitting: Surgical

## 2023-09-22 ENCOUNTER — Ambulatory Visit: Payer: Medicare HMO

## 2023-09-22 DIAGNOSIS — R600 Localized edema: Secondary | ICD-10-CM | POA: Diagnosis not present

## 2023-09-22 DIAGNOSIS — M542 Cervicalgia: Secondary | ICD-10-CM

## 2023-09-22 DIAGNOSIS — M6281 Muscle weakness (generalized): Secondary | ICD-10-CM

## 2023-09-22 DIAGNOSIS — M25511 Pain in right shoulder: Secondary | ICD-10-CM

## 2023-09-24 ENCOUNTER — Encounter: Payer: Self-pay | Admitting: Surgical

## 2023-09-24 MED ORDER — HYDROCODONE-ACETAMINOPHEN 5-325 MG PO TABS: 1.0000 | ORAL_TABLET | Freq: Four times a day (QID) | ORAL | 0 refills | Status: DC | PRN

## 2023-09-24 MED ORDER — GABAPENTIN 300 MG PO CAPS: 300.0000 mg | ORAL_CAPSULE | Freq: Every evening | ORAL | 0 refills | Status: AC | PRN

## 2023-09-24 NOTE — Progress Notes (Signed)
 Post-Op Visit Note   Patient: Duane Cuevas           Date of Birth: 08-20-1955           MRN: [id number] Visit Date: 09/22/2023 PCP: Georgina Quint, MD   Assessment & Plan:  Chief Complaint:  Chief Complaint  Patient presents with   Right Shoulder - Follow-up    08/08/23 right RSA, BT   Visit Diagnoses:  1. Neck pain     Plan: Patient is a 68 year old male who presents s/p right reverse shoulder arthroplasty on 08/08/23.  Doing okay overall during the day without any significant pain.  Most of his pain he localizes to the shoulder region primarily at night.  He will have pain that extends from the shoulder into the elbow as well as pain in the scapular region mostly between his shoulder blades.  He will also have left-sided scapular pain with occasional left shoulder pain that is new for him.  He has had prior left reverse shoulder arthroplasty and has done well from this.  No numbness or tingling or radiating pain past the elbow.  Does have some occasional neck pain.  On exam, patient has good range of motion of the operative shoulder with 45 degrees external rotation, 90 degrees abduction, 125 degrees forward elevation passively.  Axillary nerve intact with deltoid firing.  Intact EPL, FPL, finger abduction, pronation/supination, bicep, tricep, deltoid without weakness.  He has subscap strength rated 5 -/5.  Incision is healing well without evidence of infection or dehiscence.  There is no significant grinding or crepitus noted with passive motion of the shoulder.  He has negative Lhermitte sign negative Spurling sign.  Impression is right shoulder that seems to be coming along well with physical therapy.  He has pain at night that causes pain in initially the right shoulder but now also the left shoulder as well as the scapular region.  With this description of pain and the unrelenting nature of it, think that he may have some contribution from the cervical spine.  He has  prior verbal spine ET scan that was done in September 2023 demonstrating severe multilevel degenerative disc disease, most severe at C6-C7.  With these degenerative changes, he may have some nerve compression contributing to his symptoms.  Will order MRI of the cervical spine for further evaluation of right sided radiculopathy and scapular pain.  Follow-up after MRI to review results with Dr. August Saucer and at that point we can see how he is doing which will be approximately 2 to 2-1/2 months out from surgery.  Follow-Up Instructions: No follow-ups on file.   Orders:  Orders Placed This Encounter  Procedures   MR Cervical Spine w/o contrast   Meds ordered this encounter  Medications   HYDROcodone-acetaminophen (NORCO/VICODIN) 5-325 MG tablet    Sig: Take 1 tablet by mouth every 6 (six) hours as needed for moderate pain (pain score 4-6). Do not take with Oxycodone    Dispense:  30 tablet    Refill:  0   gabapentin (NEURONTIN) 300 MG capsule    Sig: Take 1 capsule (300 mg total) by mouth at bedtime as needed.    Dispense:  60 capsule    Refill:  0    Imaging: No results found.  PMFS History: Patient Active Problem List   Diagnosis Date Noted   Arthritis of right shoulder region 08/20/2023   S/P reverse total shoulder arthroplasty, right 08/08/2023   Hematoma of right shoulder 04/17/2023  Contusion of right shoulder 04/17/2023   Rotator cuff arthropathy, left 08/31/2022   S/P reverse total shoulder arthroplasty, left 08/30/2022   Intractable persistent migraine aura without cerebral infarction and with status migrainosus 05/26/2021   Hypertension associated with diabetes (HCC) 09/21/2020   Uncontrolled type 2 diabetes mellitus with hyperglycemia (HCC) 09/13/2020   CAD (coronary artery disease) 01/23/2020   HLD (hyperlipidemia) 11/02/2019   History of MI (myocardial infarction) 11/02/2019   COPD ? GOLD III/ active smoker 08/14/2018   COPD (chronic obstructive pulmonary disease) (HCC)  10/14/2014   Dyslipidemia associated with type 2 diabetes mellitus (HCC) 10/14/2014   Past Medical History:  Diagnosis Date   Cerebral aneurysm, nonruptured    followed by dr n. 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   Coronary artery disease    (pt admitted for chest pain ) nuclear stress test 11-02-2019  low risk, nuclear ef 58%,  prior inferior defect of severe severity indicative of previous MI;;   echo 04-28-2020 ef 60-65%, mild concentric LVH, RSVP 54.37mmHg;;   cardiac CTA 09-15-2020  unremarkable,  calcium score=zero   Dependence on nocturnal oxygen therapy    02-27-2023  per pt daughter,  pt only uses at night w/ O2 sat ,2-3L via [place],  only uses portable if going outside when every hot which is not much   Diverticulosis of colon    DOE (dyspnea on exertion)    02-27-2023  per pt daugter sob w/ stairs, incline's,  ok w/ some yard work if not warm/ hot wearther,  can do house hold chores   ED (erectile dysfunction)    Emphysema/COPD (HCC)    followed by pcp;   uses breztri bid, uses preventil nebulizer every afternoon;   last exacerbation 06/ 2023 in epic   Full dentures    GERD (gastroesophageal reflux disease)    Hepatic steatosis    History of adenomatous polyp of colon    History of gastric ulcer    per EGD 09-16-2020  non-bleeding   History of MI (myocardial infarction)    per nuclear stress test in epic 11-02-2019 showed prior inferior defect of severe severity indicative of previous MI   History of pelvic fracture    1999  and 2009   Hyperlipidemia    Hypertension    Smokers' cough (HCC)    Type 2 diabetes mellitus treated with insulin (HCC)    followed by pcp;  dx 2016   (02-27-2023  per pt daughter pt   Wears glasses     Family History  Problem Relation Age of Onset   Emphysema Mother    Diabetes Mother    Emphysema Father    Cancer Sister        Stomach cancer   Diabetes Sister     Stomach cancer Sister    Diabetes Brother    Colon cancer Neg Hx    Colon polyps Neg Hx    Esophageal cancer Neg Hx    Rectal cancer Neg Hx     Past Surgical History:  Procedure Laterality Date   BICEPT TENODESIS Right 08/08/2023   Procedure: BICEPS TENODESIS;  Surgeon: Cammy Copa, MD;  Location: [facility] OR;  Service: Orthopedics;  Laterality: Right;   BIOPSY  09/16/2020   Procedure: BIOPSY;  Surgeon: Tressia Danas, MD;  Location: [facility];  Service: Gastroenterology;;   CHOLECYSTECTOMY N/A 03/20/2022   Procedure: LAPAROSCOPIC CHOLECYSTECTOMY;  Surgeon: Darnell Level, MD;  Location: [facility]  ORS;  Service: General;  Laterality: N/A;   COLONOSCOPY WITH PROPOFOL  05/10/2018   dr Adela Lank   ESOPHAGOGASTRODUODENOSCOPY N/A 09/23/2015   Procedure: ESOPHAGOGASTRODUODENOSCOPY (EGD);  Surgeon: Meryl Dare, MD;  Location: [facility];  Service: Endoscopy;  Laterality: N/A;   ESOPHAGOGASTRODUODENOSCOPY (EGD) WITH PROPOFOL N/A 09/16/2020   Procedure: ESOPHAGOGASTRODUODENOSCOPY (EGD) WITH PROPOFOL;  Surgeon: Tressia Danas, MD;  Location: [facility] ENDOSCOPY;  Service: Gastroenterology;  Laterality: N/A;   EXCISION MASS NECK Left 03/09/2023   Procedure: EXCISION SOFT TISSUE MASS LEFT POSTERIOR NECK;  Surgeon: Darnell Level, MD;  Location: [facility] ORS;  Service: General;  Laterality: Left;   FOOT SURGERY Left 1999   INGUINAL HERNIA REPAIR Left 05/15/2018   Procedure: LEFT INGUINAL HERNIA REPAIR WITH MESH;  Surgeon: Harriette Bouillon, MD;  Location: [facility] OR;  Service: General;  Laterality: Left;   INSERTION OF MESH Left 05/15/2018   Procedure: INSERTION OF MESH;  Surgeon: Harriette Bouillon, MD;  Location: [facility] OR;  Service: General;  Laterality: Left;   INTRAOPERATIVE CHOLANGIOGRAM N/A 03/20/2022   Procedure: INTRAOPERATIVE CHOLANGIOGRAM;  Surgeon: Darnell Level, MD;  Location: [facility] ORS;  Service: General;  Laterality: N/A;   REVERSE SHOULDER ARTHROPLASTY Left 08/30/2022   Procedure: LEFT REVERSE SHOULDER  ARTHROPLASTY;  Surgeon: Cammy Copa, MD;  Location: [facility] OR;  Service: Orthopedics;  Laterality: Left;   REVERSE SHOULDER ARTHROPLASTY Right 08/08/2023   Procedure: RIGHT REVERSE SHOULDER ARTHROPLASTY;  Surgeon: Cammy Copa, MD;  Location: [facility] OR;  Service: Orthopedics;  Laterality: Right;   SHOULDER ARTHROSCOPY W/ ROTATOR CUFF REPAIR Left    yrs ago   Social History   Occupational History   Occupation: Employed    Employer: [employer]  Tobacco Use   Smoking status: Every Day    Current packs/day: 0.15    Average packs/day: 0.2 packs/day for 54.2 years (8.1 ttl pk-yrs)    Types: Cigarettes    Start date: 1971   Smokeless tobacco: Never   Tobacco comments:    02-27-2023  per pt daughter pt is still smoking, but hides how much per day,  smoking since age 89 (78)  Vaping Use   Vaping status: Never Used  Substance and Sexual Activity   Alcohol use: Not Currently    Alcohol/week: 2.0 standard drinks of alcohol    Types: 2 Cans of beer per week    Comment: 2 beers per week   Drug use: Never   Sexual activity: Yes

## 2023-09-27 ENCOUNTER — Emergency Department (HOSPITAL_COMMUNITY)

## 2023-09-27 ENCOUNTER — Emergency Department (HOSPITAL_COMMUNITY)
Admission: EM | Admit: 2023-09-27 | Discharge: 2023-09-27 | Disposition: A | Discharge status: Home / Self Care | Attending: Emergency Medicine | Admitting: Emergency Medicine

## 2023-09-27 DIAGNOSIS — I6523 Occlusion and stenosis of bilateral carotid arteries: Secondary | ICD-10-CM | POA: Diagnosis not present

## 2023-09-27 DIAGNOSIS — R519 Headache, unspecified: Secondary | ICD-10-CM | POA: Diagnosis not present

## 2023-09-27 DIAGNOSIS — J449 Chronic obstructive pulmonary disease, unspecified: Secondary | ICD-10-CM | POA: Insufficient documentation

## 2023-09-27 DIAGNOSIS — Z7984 Long term (current) use of oral hypoglycemic drugs: Secondary | ICD-10-CM | POA: Diagnosis not present

## 2023-09-27 DIAGNOSIS — E119 Type 2 diabetes mellitus without complications: Secondary | ICD-10-CM | POA: Insufficient documentation

## 2023-09-27 DIAGNOSIS — M50321 Other cervical disc degeneration at C4-C5 level: Secondary | ICD-10-CM | POA: Diagnosis not present

## 2023-09-27 DIAGNOSIS — Z7982 Long term (current) use of aspirin: Secondary | ICD-10-CM | POA: Insufficient documentation

## 2023-09-27 DIAGNOSIS — M542 Cervicalgia: Secondary | ICD-10-CM

## 2023-09-27 DIAGNOSIS — Z79899 Other long term (current) drug therapy: Secondary | ICD-10-CM | POA: Diagnosis not present

## 2023-09-27 DIAGNOSIS — M47812 Spondylosis without myelopathy or radiculopathy, cervical region: Secondary | ICD-10-CM | POA: Diagnosis not present

## 2023-09-27 DIAGNOSIS — I2510 Atherosclerotic heart disease of native coronary artery without angina pectoris: Secondary | ICD-10-CM | POA: Diagnosis not present

## 2023-09-27 DIAGNOSIS — I671 Cerebral aneurysm, nonruptured: Secondary | ICD-10-CM | POA: Diagnosis not present

## 2023-09-27 DIAGNOSIS — Z794 Long term (current) use of insulin: Secondary | ICD-10-CM | POA: Insufficient documentation

## 2023-09-27 DIAGNOSIS — I672 Cerebral atherosclerosis: Secondary | ICD-10-CM | POA: Diagnosis not present

## 2023-09-27 LAB — CBC WITH DIFFERENTIAL/PLATELET
Abs Immature Granulocytes: 0.07 10*3/uL (ref 0.00–0.07)
Basophils Absolute: 0.1 10*3/uL (ref 0.0–0.1)
Basophils Relative: 1 %
Eosinophils Absolute: 0.2 10*3/uL (ref 0.0–0.5)
Eosinophils Relative: 2 %
HCT: 49.8 % (ref 39.0–52.0)
Hemoglobin: 16.4 g/dL (ref 13.0–17.0)
Immature Granulocytes: 1 %
Lymphocytes Relative: 16 %
Lymphs Abs: 1.8 10*3/uL (ref 0.7–4.0)
MCH: 30.4 pg (ref 26.0–34.0)
MCHC: 32.9 g/dL (ref 30.0–36.0)
MCV: 92.2 fL (ref 80.0–100.0)
Monocytes Absolute: 0.8 10*3/uL (ref 0.1–1.0)
Monocytes Relative: 7 %
Neutro Abs: 8.2 10*3/uL — ABNORMAL HIGH (ref 1.7–7.7)
Neutrophils Relative %: 73 %
Platelets: 268 10*3/uL (ref 150–400)
RBC: 5.4 MIL/uL (ref 4.22–5.81)
RDW: 13.5 % (ref 11.5–15.5)
WBC: 11 10*3/uL — ABNORMAL HIGH (ref 4.0–10.5)
nRBC: 0 % (ref 0.0–0.2)

## 2023-09-27 LAB — BASIC METABOLIC PANEL
Anion gap: 9 (ref 5–15)
BUN: 20 mg/dL (ref 8–23)
CO2: 27 mmol/L (ref 22–32)
Calcium: 9.6 mg/dL (ref 8.9–10.3)
Chloride: 101 mmol/L (ref 98–111)
Creatinine, Ser: 0.87 mg/dL (ref 0.61–1.24)
GFR, Estimated: >60 mL/min (ref >60)
Glucose, Bld: 86 mg/dL (ref 70–99)
Potassium: 3.9 mmol/L (ref 3.5–5.1)
Sodium: 137 mmol/L (ref 135–145)

## 2023-09-27 MED ORDER — METOCLOPRAMIDE HCL 5 MG/ML IJ SOLN
10.0000 mg | Freq: Once | INTRAMUSCULAR | Status: AC
  Administered 2023-09-27: 10 mg via INTRAVENOUS
  Filled 2023-09-27: qty 2

## 2023-09-27 MED ORDER — LIDOCAINE 5 % EX PTCH
1.0000 | MEDICATED_PATCH | CUTANEOUS | Status: DC
  Filled 2023-09-27: qty 1

## 2023-09-27 MED ORDER — IOHEXOL 300 MG/ML  SOLN: 75.0000 mL | Freq: Once | INTRAMUSCULAR | Status: DC | PRN

## 2023-09-27 MED ORDER — DIPHENHYDRAMINE HCL 50 MG/ML IJ SOLN
25.0000 mg | Freq: Once | INTRAMUSCULAR | Status: AC
  Administered 2023-09-27: 25 mg via INTRAVENOUS
  Filled 2023-09-27: qty 1

## 2023-09-27 MED ORDER — IOHEXOL 350 MG/ML SOLN
75.0000 mL | Freq: Once | INTRAVENOUS | Status: AC | PRN
  Administered 2023-09-27: 75 mL via INTRAVENOUS

## 2023-09-27 MED ORDER — CYCLOBENZAPRINE HCL 10 MG PO TABS: 10.0000 mg | ORAL_TABLET | Freq: Two times a day (BID) | ORAL | 0 refills | Status: AC | PRN

## 2023-09-27 NOTE — ED Provider Notes (Signed)
  EMERGENCY DEPARTMENT AT [facility] Provider Note   CSN: [id number] Arrival date & time: 09/27/23  1009     History  Chief Complaint  Patient presents with   Headache    Duane Cuevas is a 68 y.o. male.  Patient with history of CAD/ACS, COPD, diabetes, shoulder replacements, migraine headache --presents to the emergency department for evaluation of left-sided headache and neck pain with radiation of the shoulder over the past 1 week.  Patient took leftover hydrocodone (from shoulder surgery) without improvement.  Symptoms started mildly, no thunderclap.  Over several days the pain worsened.  Pain is worse with movement and when he lies flat.  No fevers.  No vision change.  No weakness in the arms or the legs.  No anticoagulation.      Home Medications Prior to Admission medications   Medication Sig Start Date End Date Taking? Authorizing Provider  albuterol (PROVENTIL) (2.5 MG/3ML) 0.083% nebulizer solution INHALAR 1 FRASCO VIA NEBULIZADOR CADA 6 HORAS SEGUN SEA NECESARIO PARA SIBILANCIAS O FALTA DE AIRE 12/07/22   Georgina Quint, MD  albuterol (VENTOLIN HFA) 108 (90 Base) MCG/ACT inhaler INHALAR 2 BOCANADAS POR VIA ORAL CADA 6 HORAS SEGUN SEA NECESARIO PARA SIBILANCIAS O FALTA DE AIRE 10/07/22   Sagardia, Eilleen Kempf, MD  amLODipine (NORVASC) 5 MG tablet TOMAR 1 TABLETA POR VAI ORAL UNA VEZ AL DIA 12/07/22   Georgina Quint, MD  aspirin EC 81 MG tablet Take 1 tablet (81 mg total) by mouth daily. Swallow whole. 09/01/22   Magnant, Charles L, PA-C  atorvastatin (LIPITOR) 20 MG tablet TOMAR 1 TABLETA POR VIA ORAL UNA VEZ AL DIA 10/07/22   Georgina Quint, MD  blood glucose meter kit and supplies KIT Dispense based on patient and insurance preference. Use up to four times daily as directed. 10/22/20   Georgina Quint, MD  blood glucose meter kit and supplies Dispense based on patient and insurance preference. Use up to four times daily as directed.  (FOR ICD-10 E10.9, E11.9). 10/24/20   Georgina Quint, MD  Blood Glucose Monitoring Suppl (ONE TOUCH ULTRA 2) w/Device KIT Use as directed to check blood glucose up to 2 times a day 12/29/22   Georgina Quint, MD  Budeson-Glycopyrrol-Formoterol (BREZTRI AEROSPHERE) 160-9-4.8 MCG/ACT AERO INHALAR 2 BOCANADAS POR VIA ORAL DOS VECES AL DIA 06/16/23   Georgina Quint, MD  celecoxib (CELEBREX) 100 MG capsule Take 1 capsule (100 mg total) by mouth 2 (two) times daily. 08/09/23   Magnant, Charles L, PA-C  docusate sodium (COLACE) 100 MG capsule Take 1 capsule (100 mg total) by mouth 2 (two) times daily. 08/09/23   Magnant, Joycie Peek, PA-C  Dulaglutide (TRULICITY) 1.5 MG/0.5ML S[place] Soda CONTENIDOS DE UN LAPIZ POR VIA SUBCUTANEA CADA [place], EL MISMO DIA CADA [facility] 07/18/23   Georgina Quint, MD  FARXIGA 10 MG TABS tablet TOMAR 1 TABLETA POR VIA ORAL UNA VEZ AL DIA 02/06/23   Georgina Quint, MD  gabapentin (NEURONTIN) 300 MG capsule Take 1 capsule (300 mg total) by mouth at bedtime as needed. 09/24/23   Magnant, Charles L, PA-C  glipiZIDE (GLUCOTROL) 10 MG tablet TOMAR 1 TABLETA POR VIA ORAL UNA VEZ AL DIA ANTES DEL DESAYUNO *TOMAR 1 TABLETA ADICIONAL SI AZUCAR EN LA SANGRE ESTA ALTA* 09/14/22   Sagardia, Eilleen Kempf, MD  HYDROcodone-acetaminophen (NORCO/VICODIN) 5-325 MG tablet Take 1 tablet by mouth every 6 (six) hours as needed for moderate pain (pain score 4-6). Do not  take with Oxycodone 09/24/23   Magnant, Charles L, PA-C  ibuprofen (ADVIL) 800 MG tablet Take 1 tablet (800 mg total) by mouth every 8 (eight) hours as needed. 08/25/23   Magnant, Charles L, PA-C  insulin aspart (NOVOLOG FLEXPEN) 100 UNIT/ML FlexPen INYECTAR 3 UNIDADES POR VIA SUBCUTANEA TRES VECES AL DIA COMO INDICADO. AJUSTAR CANTIDAD DE INSULINA POR ESCALA MOVIL. MAS DOSIS 30 UNIDADES 08/02/23   Georgina Quint, MD  Insulin Pen Needle (B-D ULTRAFINE III SHORT PEN) 31G X 8 MM MISC USAR PARA INYECTAR INSULINA  10/13/21   Sagardia, Eilleen Kempf, MD  Insulin Pen Needle (B-D ULTRAFINE III SHORT PEN) 31G X 8 MM MISC USAR PARA INYECTAR INSULINA 10/13/21   Sagardia, Eilleen Kempf, MD  methocarbamol (ROBAXIN) 500 MG tablet Take 1 tablet (500 mg total) by mouth 2 (two) times daily. Patient not taking: Reported on 08/03/2023 04/14/23   Lorre Nick, MD  mupirocin ointment (BACTROBAN) 2 % Apply 1 Application topically daily. 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  Follow discharge instructions on paperwork for duration 08/09/23   Magnant, Charles L, PA-C  nicotine (NICODERM CQ) 14 mg/24hr patch Place 1 patch (14 mg total) onto the skin daily. Patient not taking: Reported on 08/03/2023 08/02/23   Georgina Quint, MD  [facility] ULTRA test strip USE TO CHECK BLOOD SUGAR AS INSTRUCTED 01/02/23   Georgina Quint, MD  pantoprazole (PROTONIX) 40 MG tablet TOMAR 1 TABLETA POR VIA ORAL UNA VEZ AL DIA 01/09/23   Georgina Quint, MD  sildenafil (VIAGRA) 100 MG tablet Take 0.5-1 tablets (50-100 mg total) by mouth daily as needed for erectile dysfunction. Patient taking differently: Take 50-100 mg by mouth daily as needed for erectile dysfunction. 12/29/22   Georgina Quint, MD  TRESIBA FLEXTOUCH 100 UNIT/ML FlexTouch Pen INYECTAR 25 UNIDADES POR VIA SUBCUTANEA DIARIAMENTE Patient taking differently: Inject 30 Units into the skin daily. 04/10/23   Georgina Quint, MD  Ubrogepant (UBRELVY) 100 MG TABS TOMAR 1 TALBETA POR VIA ORAL UNA VEZ AL DIA 07/11/22   Georgina Quint, MD      Allergies    Patient has no known allergies.    Review of Systems   Review of Systems  Physical Exam Updated Vital Signs BP (!) 128/92 (BP Location: Right Arm)   Pulse (!) 106   Temp 98.4 F (36.9 C) (Oral)   Resp 18   SpO2 96%  Physical Exam Vitals and nursing note reviewed.  Constitutional:      Appearance: He is well-developed.  HENT:     Head: Normocephalic and atraumatic.     Right Ear:  Tympanic membrane, ear canal and external ear normal.     Left Ear: Tympanic membrane, ear canal and external ear normal.     Nose: Nose normal.     Mouth/Throat:     Pharynx: Uvula midline.  Eyes:     General: Lids are normal.     Conjunctiva/sclera: Conjunctivae normal.     Pupils: Pupils are equal, round, and reactive to light.  Cardiovascular:     Rate and Rhythm: Normal rate and regular rhythm.  Pulmonary:     Effort: Pulmonary effort is normal.     Breath sounds: Normal breath sounds.  Abdominal:     Palpations: Abdomen is soft.     Tenderness: There is no abdominal tenderness.  Musculoskeletal:        General: Normal range of motion.     Cervical back: Normal range  of motion and neck supple. Tenderness present. No swelling, edema, deformity, erythema or bony tenderness.       Back:  Skin:    General: Skin is warm and dry.  Neurological:     Mental Status: He is alert and oriented to person, place, and time.     GCS: GCS eye subscore is 4. GCS verbal subscore is 5. GCS motor subscore is 6.     Cranial Nerves: No cranial nerve deficit.     Sensory: No sensory deficit.     Motor: No abnormal muscle tone.     Coordination: Coordination normal.     Gait: Gait normal.     Deep Tendon Reflexes: Reflexes are normal and symmetric.    ED Results / Procedures / Treatments   Labs (all labs ordered are listed, but only abnormal results are displayed) Labs Reviewed  CBC WITH DIFFERENTIAL/PLATELET - Abnormal; Notable for the following components:      Result Value   WBC 11.0 (*)    Neutro Abs 8.2 (*)    All other components within normal limits  BASIC METABOLIC PANEL    EKG None  Radiology DG Cervical Spine Complete Result Date: 09/27/2023 CLINICAL DATA:  Left-sided headache. EXAM: CERVICAL SPINE - COMPLETE 4+ VIEW COMPARISON:  Spine radiograph 12/28/2022 FINDINGS: Visualization through C6 on lateral view. Normal anatomic alignment. Multilevel degenerative disc disease  most pronounced C4-5 and C5-6. Multilevel facet degenerative changes. Prevertebral soft tissues unremarkable. Visualized lung apices are clear. IMPRESSION: Multilevel degenerative disc and facet disease. Electronically Signed   By: Annia Belt M.D.   On: 09/27/2023 11:30    Procedures Procedures    Medications Ordered in ED Medications  metoCLOPramide (REGLAN) injection 10 mg (has no administration in time range)  diphenhydrAMINE (BENADRYL) injection 25 mg (has no administration in time range)  lidocaine (LIDODERM) 5 % 1 patch (has no administration in time range)    ED Course/ Medical Decision Making/ A&P    Patient seen and examined. History obtained directly from patient.   Labs/EKG: Ordered CBC and BMP  Imaging: Ordered CT angio head and neck to evaluate for possibility of dissection.  Medications/Fluids: Ordered: IV Reglan and Benadryl, Lidoderm patch  Most recent vital signs reviewed and are as follows: BP (!) 128/92 (BP Location: Right Arm)   Pulse (!) 106   Temp 98.4 F (36.9 C) (Oral)   Resp 18   SpO2 96%   Initial impression: Left-sided headache and neck pain without neurologic involvement.  No thunderclap onset.  Low concern for subarachnoid hemorrhage.  3:01 PM Signout to [employer] at shift change.   Plan: F/u imaging, reassess. If reassuring, can be d/c. Consider muscle relaxer, pain meds.                                 Medical Decision Making Amount and/or Complexity of Data Reviewed Labs: ordered. Radiology: ordered.  Risk Prescription drug management.   In regards to the patient's headache, critical differentials were considered including subarachnoid hemorrhage, intracerebral hemorrhage, epidural/subdural hematoma, pituitary apoplexy, vertebral/carotid artery dissection, giant cell arteritis, central venous thrombosis, reversible cerebral vasoconstriction, acute angle closure glaucoma, idiopathic intracranial hypertension, bacterial meningitis,  viral encephalitis, carbon monoxide poisoning, posterior reversible encephalopathy syndrome, pre-eclampsia.   Reg flag symptoms related to these causes were considered including systemic symptoms (fever, weight loss), neurologic symptoms (confusion, mental status change, vision change, associated seizure), acute or sudden "thunderclap" onset, patient  age 52 or older with new or progressive headache, patient of any age with first headache or change in headache pattern, pregnant or postpartum status, history of HIV or other immunocompromise, history of cancer, headache occurring with exertion, associated neck or shoulder pain, associated traumatic injury, concurrent use of anticoagulation, family history of spontaneous SAH, and concurrent drug use.    Other benign, more common causes of headache were considered including migraine, tension-type headache, cluster headache, referred pain from other cause such as sinus infection, dental pain, trigeminal neuralgia.   On exam, patient has a reassuring neuro exam including baseline mental status, no significant neck pain or meningeal signs, no signs of severe infection or fever.   Currently awaiting completion of imaging due to history of vascular disease and atypical headache, to rule out possibility of artery dissection.  If negative, most likely etiology of symptoms neuropathic or musculoskeletal in nature.         Final Clinical Impression(s) / ED Diagnoses Final diagnoses:  Left-sided headache  Neck pain    Rx / DC Orders ED Discharge Orders     None         Renne Crigler, PA-C 09/27/23 1533    Alvira Monday, MD 09/27/23 2332

## 2023-09-27 NOTE — ED Triage Notes (Signed)
 Pt reports left side headache down into neck x1 week. Hurts more when he turns his head to the right. Took hydrocodone at home with no relief.

## 2023-09-27 NOTE — ED Notes (Signed)
 Attempted to reassess patient's temp. Patient is not currently in the room.

## 2023-09-27 NOTE — Discharge Instructions (Signed)
 Please read and follow all provided instructions.  Your diagnoses today include:  1. Left-sided headache   2. Neck pain     Tests performed today include: X-ray of your neck showed arthritis Complete blood cell count:  Basic metabolic panel:  CT scan of your head and neck: Vital signs. See below for your results today.   Medications:  In the Emergency Department you received: Reglan - antinausea/headache medication Benadryl - antihistamine to counteract potential side effects of reglan  Take any prescribed medications only as directed.  Additional information:  Follow any educational materials contained in this packet.  You are having a headache. No specific cause was found today for your headache. It may have been a migraine or other cause of headache. Stress, anxiety, fatigue, and depression are common triggers for headaches.   Your headache today does not appear to be life-threatening or require hospitalization, but often the exact cause of headaches is not determined in the emergency department. Therefore, follow-up with your doctor is very important to find out what may have caused your headache and whether or not you need any further diagnostic testing or treatment.   Sometimes headaches can appear benign (not harmful), but then more serious symptoms can develop which should prompt an immediate re-evaluation by your doctor or the emergency department.  BE VERY CAREFUL not to take multiple medicines containing Tylenol (also called acetaminophen). Doing so can lead to an overdose which can damage your liver and cause liver failure and possibly death.   Follow-up instructions: Please follow-up with your primary care provider in the next 3 days for further evaluation of your symptoms.   Return instructions:  Please return to the Emergency Department if you experience worsening symptoms. Return if the medications do not resolve your headache, if it recurs, or if you have  multiple episodes of vomiting or cannot keep down fluids. Return if you have a change from the usual headache. RETURN IMMEDIATELY IF you: Develop a sudden, severe headache Develop confusion or become poorly responsive or faint Develop a fever above 100.18F or problem breathing Have a change in speech, vision, swallowing, or understanding Develop new weakness, numbness, tingling, incoordination in your arms or legs Have a seizure Please return if you have any other emergent concerns.  Additional Information:  Your vital signs today were: BP 119/82   Pulse 88   Temp 98.4 F (36.9 C) (Oral)   Resp 18   SpO2 97%  If your blood pressure (BP) was elevated above 135/85 this visit, please have this repeated by your doctor within one month. --------------

## 2023-09-27 NOTE — ED Provider Notes (Signed)
 Received signout from previous provider, please see his note for complete H&P.  Patient here with pain to the left side of his head and neck for the past week without any neurological deficit.  Head and a CT scan was ordered to rule out vertebral artery dissection.  CT of the head and neck was independently viewed inter by me and overall reassuring without any acute changes.  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  I discussed this with patient.  Plan to discharge home with gabapentin and muscle relaxant as I felt this is likely to be radicular type of pain.  Gave patient return precaution.  BP 118/80   Pulse 87   Temp 97.9 F (36.6 C) (Oral)   Resp 18   SpO2 96%   Results for orders placed or performed during the hospital encounter of 09/27/23  CBC with Differential   Collection Time: 09/27/23  2:42 PM  Result Value Ref Range   WBC 11.0 (H) 4.0 - 10.5 K/uL   RBC 5.40 4.22 - 5.81 MIL/uL   Hemoglobin 16.4 13.0 - 17.0 g/dL   HCT 16.1 09.6 - 04.5 %   MCV 92.2 80.0 - 100.0 fL   MCH 30.4 26.0 - 34.0 pg   MCHC 32.9 30.0 - 36.0 g/dL   RDW 40.9 81.1 - 91.4 %   Platelets 268 150 - 400 K/uL   nRBC 0.0 0.0 - 0.2 %   Neutrophils Relative % 73 %   Neutro Abs 8.2 (H) 1.7 - 7.7 K/uL   Lymphocytes Relative 16 %   Lymphs Abs 1.8 0.7 - 4.0 K/uL   Monocytes Relative 7 %   Monocytes Absolute 0.8 0.1 - 1.0 K/uL   Eosinophils Relative 2 %   Eosinophils Absolute 0.2 0.0 - 0.5 K/uL   Basophils Relative 1 %   Basophils Absolute 0.1 0.0 - 0.1 K/uL   Immature Granulocytes 1 %   Abs Immature Granulocytes 0.07 0.00 - 0.07 K/uL  Basic metabolic panel   Collection Time: 09/27/23  2:42 PM  Result Value Ref Range   Sodium 137 135 - 145 mmol/L   Potassium 3.9 3.5 - 5.1 mmol/L   Chloride 101 98 - 111 mmol/L   CO2 27 22 - 32 mmol/L   Glucose, Bld 86 70 - 99 mg/dL   BUN 20 8 - 23 mg/dL   Creatinine, Ser 7.82 0.61 - 1.24 mg/dL   Calcium 9.6 8.9 - 95.6 mg/dL    GFR, Estimated >21 >30 mL/min   Anion gap 9 5 - 15   CT ANGIO HEAD NECK W WO CM Result Date: 09/27/2023 CLINICAL DATA:  Neuro deficit, acute, stroke suspected. Left-sided headache for 1 week with pain extending into the neck. EXAM: CT ANGIOGRAPHY HEAD AND NECK WITH AND WITHOUT CONTRAST TECHNIQUE: Multidetector CT imaging of the head and neck was performed using the standard protocol during bolus administration of intravenous contrast. Multiplanar CT image reconstructions and MIPs were obtained to evaluate the vascular anatomy. Carotid stenosis measurements (when applicable) are obtained utilizing NASCET criteria, using the distal internal carotid diameter as the denominator. RADIATION DOSE REDUCTION: This exam was performed according to the departmental dose-optimization program which includes automated exposure control, adjustment of the mA and/or kV according to patient size and/or use of iterative reconstruction technique. CONTRAST:  75mL OMNIPAQUE IOHEXOL 350 MG/ML SOLN COMPARISON:  MRA head 06/13/2022. CT head 03/14/2022. CTA head and neck 06/26/2020. FINDINGS: CT HEAD FINDINGS Brain: There is no  evidence of an acute infarct, intracranial hemorrhage, mass, midline shift, or extra-axial fluid collection. Cerebral volume is normal. The ventricles are normal in size. Vascular: Reported below. Skull: No acute fracture or suspicious lesion. Sinuses/Orbits: Minimal mucosal thickening in the maxillary sinus that minimal mucosal thickening in the sphenoid sinuses. Clear mastoid air cells. Unremarkable orbits. Other: None. Review of the MIP images confirms the above findings CTA NECK FINDINGS Aortic arch: Standard branching with mild atherosclerosis. No significant stenosis of the arch vessel origins. Right carotid system: Patent with a small amount of calcified plaque in the proximal ICA. No evidence of a significant stenosis or dissection. Left carotid system: Patent without evidence of stenosis or dissection.  Vertebral arteries: Patent and codominant. Small amount of calcified plaque at the right vertebral artery origin. No evidence of a significant stenosis or dissection. Skeleton: Moderate cervical spondylosis. Interbody ankylosis at C6-7. Mild chronic T1 compression fracture. Other neck: No evidence of cervical lymphadenopathy or mass. Upper chest: Moderate paraseptal and centrilobular emphysema. Bronchial wall thickening. Review of the MIP images confirms the above findings CTA HEAD FINDINGS Anterior circulation: The internal carotid arteries are patent from skull base to carotid termini with mild atherosclerosis bilaterally not resulting in a significant stenosis. A 2 mm left paraophthalmic ICA aneurysm is unchanged from 2021. ACAs and MCAs are patent without evidence of a proximal branch occlusion or significant proximal stenosis. Posterior circulation: The intracranial vertebral arteries are widely patent to the basilar. Patent PICA, AICA, and SCA origins are visualized bilaterally. The basilar artery is widely patent. There are large right and small left posterior communicating arteries with hypoplasia or absence of the right P1 segment. Both PCAs are patent without evidence of a significant proximal stenosis. No aneurysm is identified. Venous sinuses: As permitted by contrast timing, patent. Anatomic variants: Fetal right PCA. Review of the MIP images confirms the above findings IMPRESSION: 1. No evidence of acute intracranial abnormality. 2. 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 3. 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 Electronically Signed   By: Sebastian Ache M.D.   On: 09/27/2023 17:06   DG Cervical Spine Complete Result Date: 09/27/2023 CLINICAL DATA:  Left-sided headache. EXAM: CERVICAL SPINE - COMPLETE 4+ VIEW COMPARISON:  Spine radiograph 12/28/2022 FINDINGS: Visualization through C6 on lateral view. Normal  anatomic alignment. Multilevel degenerative disc disease most pronounced C4-5 and C5-6. Multilevel facet degenerative changes. Prevertebral soft tissues unremarkable. Visualized lung apices are clear. IMPRESSION: Multilevel degenerative disc and facet disease. Electronically Signed   By: Annia Belt M.D.   On: 09/27/2023 11:30      Fayrene Helper, PA-C 09/27/23 1748    Virgina Norfolk, DO 09/27/23 2045

## 2023-09-28 ENCOUNTER — Telehealth: Payer: Self-pay

## 2023-09-28 NOTE — Transitions of Care (Post Inpatient/ED Visit) (Signed)
 09/28/2023  Name: Duane Cuevas MRN: [id number] DOB: 1956-06-18  Today's TOC FU Call Status: Today's TOC FU Call Status:: Successful TOC FU Call Completed TOC FU Call Complete Date: 09/28/23 Patient's Name and Date of Birth confirmed.  Transition Care Management Follow-up Telephone Call Date of Discharge: 09/27/23 Discharge Facility: [facility]) Type of Discharge: Emergency Department Reason for ED Visit: Other: (cervicalgia) How have you been since you were released from the hospital?: Better Any questions or concerns?: No  Items Reviewed: Did you receive and understand the discharge instructions provided?: Yes Medications obtained,verified, and reconciled?: Yes (Medications Reviewed) Any new allergies since your discharge?: No Dietary orders reviewed?: NA Do you have support at home?: Yes People in Home: child(ren), adult  Medications Reviewed Today: Medications Reviewed Today     Reviewed by Karena Addison, LPN (Licensed Practical Nurse) on 09/28/23 at 1422  Med List Status: <None>   Medication Order Taking? Sig Documenting Provider Last Dose Status Informant  albuterol (PROVENTIL) (2.5 MG/3ML) 0.083% nebulizer solution [id number] No INHALAR 1 FRASCO VIA NEBULIZADOR CADA 6 HORAS SEGUN SEA NECESARIO PARA SIBILANCIAS O FALTA DE AIRE Georgina Quint, MD 08/08/2023  8:00 AM Active Self           Med Note Richardean Canal Aug 03, 2023  9:04 AM)    albuterol (VENTOLIN HFA) 108 (90 Base) MCG/ACT inhaler [id number] No INHALAR 2 BOCANADAS POR VIA ORAL CADA 6 HORAS SEGUN SEA NECESARIO PARA SIBILANCIAS O FALTA DE AIRE Georgina Quint, MD 08/08/2023  8:30 AM Active Self  amLODipine (NORVASC) 5 MG tablet [id number] No TOMAR 1 TABLETA POR VAI ORAL UNA VEZ AL DIA Georgina Quint, MD 08/08/2023  8:00 AM Active Self  aspirin EC 81 MG tablet [id number] No Take 1 tablet (81 mg total) by mouth daily. Swallow whole. Domingo Dimes 08/05/2023 Active Self   atorvastatin (LIPITOR) 20 MG tablet [id number] No TOMAR 1 TABLETA POR VIA ORAL UNA VEZ AL DIA Georgina Quint, MD 08/08/2023  8:00 AM Active Self  blood glucose meter kit and supplies [id number] No Dispense based on patient and insurance preference. Use up to four times daily as directed. (FOR ICD-10 E10.9, E11.9). Georgina Quint, MD Taking Active Self           Med Note Alona Bene, CYNTHIA A   Tue Dec 22, 2020 10:03 AM) use  blood glucose meter kit and supplies KIT [id number] No Dispense based on patient and insurance preference. Use up to four times daily as directed. Georgina Quint, MD Taking Active Self           Med Note Alona Bene, CYNTHIA A   Tue Dec 22, 2020 10:03 AM) use  Blood Glucose Monitoring Suppl (ONE TOUCH ULTRA 2) w/Device KIT [id number] No Use as directed to check blood glucose up to 2 times a day Georgina Quint, MD Taking Active Self  Budeson-Glycopyrrol-Formoterol (BREZTRI AEROSPHERE) 160-9-4.8 MCG/ACT Sandrea Matte [id number] No INHALAR 2 BOCANADAS POR VIA ORAL DOS VECES AL DIA Georgina Quint, MD 08/08/2023  8:30 AM Active Self  celecoxib (CELEBREX) 100 MG capsule [id number]  Take 1 capsule (100 mg total) by mouth 2 (two) times daily. Magnant, Joycie Peek, PA-C  Active   cyclobenzaprine (FLEXERIL) 10 MG tablet [id number]  Take 1 tablet (10 mg total) by mouth 2 (two) times daily as needed for muscle spasms. Fayrene Helper, PA-C  Active   docusate sodium (COLACE) 100 MG capsule [id number]  Take 1 capsule (100 mg  total) by mouth 2 (two) times daily. Magnant, Joycie Peek, PA-C  Active   Dulaglutide (TRULICITY) 1.5 MG/0.5ML Ivory Broad [id number] No INYECTAR CONTENIDOS DE Benancio Deeds POR VIA SUBCUTANEA CADA [place], EL MISMO DIA CADA [facility] Georgina Quint, MD 07/31/2023 Active Self  FARXIGA 10 MG TABS tablet [id number] No TOMAR 1 TABLETA POR VIA ORAL UNA VEZ AL DIA Georgina Quint, MD 08/06/2023 Active Self  gabapentin (NEURONTIN) 300 MG capsule [id number]  Take 1 capsule (300 mg total) by  mouth at bedtime as needed. Magnant, Joycie Peek, PA-C  Active   glipiZIDE (GLUCOTROL) 10 MG tablet [id number] No TOMAR 1 TABLETA POR VIA ORAL UNA VEZ AL DIA ANTES DEL DESAYUNO *TOMAR 1 TABLETA ADICIONAL SI AZUCAR EN LA SANGRE ESTA ALTA* Alvy Bimler [place], MD 08/07/2023 Active Self  HYDROcodone-acetaminophen (NORCO/VICODIN) 5-325 MG tablet [id number]  Take 1 tablet by mouth every 6 (six) hours as needed for moderate pain (pain score 4-6). Do not take with Oxycodone Magnant, Joycie Peek, PA-C  Active   ibuprofen (ADVIL) 800 MG tablet [id number]  Take 1 tablet (800 mg total) by mouth every 8 (eight) hours as needed. Magnant, Joycie Peek, PA-C  Active   insulin aspart (NOVOLOG FLEXPEN) 100 UNIT/ML FlexPen [id number] No INYECTAR 3 UNIDADES POR VIA SUBCUTANEA TRES VECES AL DIA COMO INDICADO. AJUSTAR CANTIDAD DE INSULINA POR ESCALA MOVIL. MAS DOSIS [address] [place] 08/06/2023 Active Self  Insulin Pen Needle (B-D ULTRAFINE III SHORT PEN) 31G X 8 MM MISC [id number] No USAR PARA INYECTAR INSULINA Georgina Quint, MD Taking Active Self  Insulin Pen Needle (B-D ULTRAFINE III SHORT PEN) 31G X 8 MM MISC [id number] No USAR PARA INYECTAR INSULINA Sagardia, Eilleen Kempf, MD Taking Active Self  methocarbamol (ROBAXIN) 500 MG tablet [id number] No Take 1 tablet (500 mg total) by mouth 2 (two) times daily.  Patient not taking: Reported on 08/03/2023   Lorre Nick, MD Not Taking Active Self  mupirocin ointment (BACTROBAN) 2 % [id number]  Apply 1 Application topically daily. 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  Follow discharge instructions on paperwork for duration Magnant, Joycie Peek, PA-C  Active   nicotine (NICODERM CQ) 14 mg/24hr patch [id number] No Place 1 patch (14 mg total) onto the skin daily.  Patient not taking: Reported on 08/03/2023   Georgina Quint, MD Not Taking Active Self  Koren Bound test strip [id number] No USE TO CHECK BLOOD SUGAR AS INSTRUCTED Georgina Quint, MD Taking Active Self  pantoprazole (PROTONIX) 40 MG tablet [id number] No TOMAR 1 TABLETA POR VIA ORAL UNA VEZ AL DIA Georgina Quint, MD 08/08/2023  8:00 AM Active Self  sildenafil (VIAGRA) 100 MG tablet [id number] No Take 0.5-1 tablets (50-100 mg total) by mouth daily as needed for erectile dysfunction.  Patient taking differently: Take 50-100 mg by mouth daily as needed for erectile dysfunction.   Georgina Quint, MD Unknown Active Self  TRESIBA FLEXTOUCH 100 UNIT/ML FlexTouch Pen [id number] No INYECTAR 25 UNIDADES POR VIA SUBCUTANEA DIARIAMENTE  Patient taking differently: Inject 30 Units into the skin daily.   Georgina Quint, MD 08/06/2023 Active Self  Ubrogepant (UBRELVY) 100 MG TABS [id number] No TOMAR 1 TALBETA POR VIA ORAL UNA VEZ AL DIA Sagardia, Eilleen Kempf, MD Taking Active Self           Med Note Harvest Dark, MEREDITH A   Tue Aug 08, 2023  9:42 AM) Not taking            Home  Care and Equipment/Supplies: Were Home Health Services Ordered?: NA Any new equipment or medical supplies ordered?: NA  Functional Questionnaire: Do you need assistance with bathing/showering or dressing?: No Do you need assistance with meal preparation?: No Do you need assistance with eating?: No Do you have difficulty maintaining continence: No Do you need assistance with getting out of bed/getting out of a chair/moving?: No Do you have difficulty managing or taking your medications?: No  Follow up appointments reviewed: PCP Follow-up appointment confirmed?: Yes Date of PCP follow-up appointment?: 10/03/23 Follow-up Provider: [facility] Follow-up appointment confirmed?: NA Do you need transportation to your follow-up appointment?: No Do you understand care options if your condition(s) worsen?: Yes-patient verbalized understanding    SIGNATURE  Karena Addison, LPN [facility] Nurse Health Advisor Direct Dial [phone]

## 2023-09-30 ENCOUNTER — Emergency Department (HOSPITAL_COMMUNITY)
Admission: EM | Admit: 2023-09-30 | Discharge: 2023-09-30 | Disposition: A | Discharge status: Home / Self Care | Attending: Emergency Medicine | Admitting: Emergency Medicine

## 2023-09-30 DIAGNOSIS — J019 Acute sinusitis, unspecified: Secondary | ICD-10-CM | POA: Diagnosis not present

## 2023-09-30 DIAGNOSIS — R42 Dizziness and giddiness: Secondary | ICD-10-CM

## 2023-09-30 LAB — CBC
HCT: 53.7 % — ABNORMAL HIGH (ref 39.0–52.0)
Hemoglobin: 17.1 g/dL — ABNORMAL HIGH (ref 13.0–17.0)
MCH: 29.6 pg (ref 26.0–34.0)
MCHC: 31.8 g/dL (ref 30.0–36.0)
MCV: 93.1 fL (ref 80.0–100.0)
Platelets: 284 10*3/uL (ref 150–400)
RBC: 5.77 MIL/uL (ref 4.22–5.81)
RDW: 13.5 % (ref 11.5–15.5)
WBC: 9.1 10*3/uL (ref 4.0–10.5)
nRBC: 0 % (ref 0.0–0.2)

## 2023-09-30 LAB — BASIC METABOLIC PANEL WITH GFR
Anion gap: 9 (ref 5–15)
BUN: 17 mg/dL (ref 8–23)
CO2: 22 mmol/L (ref 22–32)
Calcium: 9.9 mg/dL (ref 8.9–10.3)
Chloride: 102 mmol/L (ref 98–111)
Creatinine, Ser: 0.86 mg/dL (ref 0.61–1.24)
GFR, Estimated: >60 mL/min (ref >60)
Glucose, Bld: 166 mg/dL — ABNORMAL HIGH (ref 70–99)
Potassium: 4.3 mmol/L (ref 3.5–5.1)
Sodium: 133 mmol/L — ABNORMAL LOW (ref 135–145)

## 2023-09-30 MED ORDER — PROCHLORPERAZINE EDISYLATE 10 MG/2ML IJ SOLN
5.0000 mg | INTRAMUSCULAR | Status: AC
  Administered 2023-09-30: 5 mg via INTRAVENOUS
  Filled 2023-09-30: qty 2

## 2023-09-30 MED ORDER — LACTATED RINGERS IV BOLUS
500.0000 mL | Freq: Once | INTRAVENOUS | Status: AC
  Administered 2023-09-30: 500 mL via INTRAVENOUS

## 2023-09-30 MED ORDER — DIAZEPAM 5 MG/ML IJ SOLN
2.5000 mg | Freq: Once | INTRAMUSCULAR | Status: AC
  Administered 2023-09-30: 2.5 mg via INTRAVENOUS
  Filled 2023-09-30: qty 2

## 2023-09-30 MED ORDER — AMOXICILLIN-POT CLAVULANATE 875-125 MG PO TABS: 1.0000 | ORAL_TABLET | Freq: Two times a day (BID) | ORAL | 0 refills | Status: DC

## 2023-09-30 MED ORDER — MECLIZINE HCL 25 MG PO TABS: 25.0000 mg | ORAL_TABLET | Freq: Three times a day (TID) | ORAL | 0 refills | Status: DC | PRN

## 2023-09-30 NOTE — ED Triage Notes (Signed)
 Patient previously seen for headache that is now feeling better Patient now developed dizziness and nausea x 3 days  Patient denies pain in triage

## 2023-09-30 NOTE — Plan of Care (Signed)
 Discussed with Dr. Rosalia Hammers.  Recommendations: - No need to repeat CTA head and neck in the absence of any new neuro symptoms other than dizziness/nausea - Okay to treat nausea/dizziness conservatively with medications - If not improving, would obtain MRI brain w/o contrast to rule out CNS process    Brief details:   In brief this is a 68 year old Spanish-speaking gentleman with multiple vascular risk factors.  I am asked for imaging recommendations  Presenting for 3 days of dizziness and nausea, as well as pain in the right ear.  Headache has improved  Neurological examination is completely normal with no nystagmus or other abnormalities per Dr. Rosalia Hammers  Also had recent right shoulder surgery and had been complaining of some headache for which she is planned for outpatient MRI cervical spine.  Recent ED presentation for headache 3/26 was felt to likely have radicular pain.  MRI of the cervical spine is scheduled for 3/31  He did have a CTA head and neck on 3/26 which was reassuring 1. No evidence of acute intracranial abnormality. 2. 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 3. 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  Basic Metabolic Panel: Recent Labs  Lab 09/27/23 1442 09/30/23 1105  NA 137 133*  K 3.9 4.3  CL 101 102  CO2 27 22  GLUCOSE 86 166*  BUN 20 17  CREATININE 0.87 0.86  CALCIUM 9.6 9.9    CBC: Recent Labs  Lab 09/27/23 1442 09/30/23 1105  WBC 11.0* 9.1  NEUTROABS 8.2*  --   HGB 16.4 17.1*  HCT 49.8 53.7*  MCV 92.2 93.1  PLT 268 284   12 min of care, majority in discussion with Dr. Rosalia Hammers who will notify patient of interprofessional consultation

## 2023-09-30 NOTE — ED Provider Notes (Signed)
 [place] EMERGENCY DEPARTMENT AT [facility] Provider Note   CSN: [id number] Arrival date & time: 09/30/23  1043     History  Chief Complaint  Patient presents with   Dizziness    Duane Cuevas is a 68 y.o. male.  HPI 68 year old male presents today complaining of vertigo.  Patient states that he had a headache about a week ago.  He had associated left ear pain with this.  He presented to the ED at that time he  had a CT scan of his head and neck that revealed a 2 mm paraophthalmic aneurysm likely not causing any of his symptoms.  His headache has improved.  He is now complaining of vertigo symptoms were worse with turning his head to the left.  He has associated nausea and vomiting.  He has not had the symptoms in the past.  He has no other lateralized weakness.    Home Medications Prior to Admission medications   Medication Sig Start Date End Date Taking? Authorizing Provider  amoxicillin-clavulanate (AUGMENTIN) 875-125 MG tablet Take 1 tablet by mouth every 12 (twelve) hours. 09/30/23  Yes Margarita Grizzle, MD  meclizine (ANTIVERT) 25 MG tablet Take 1 tablet (25 mg total) by mouth 3 (three) times daily as needed for dizziness. 09/30/23  Yes Margarita Grizzle, MD  albuterol (PROVENTIL) (2.5 MG/3ML) 0.083% nebulizer solution INHALAR 1 FRASCO VIA NEBULIZADOR CADA 6 HORAS SEGUN SEA NECESARIO PARA SIBILANCIAS O FALTA DE AIRE 12/07/22   Georgina Quint, MD  albuterol (VENTOLIN HFA) 108 (90 Base) MCG/ACT inhaler INHALAR 2 BOCANADAS POR VIA ORAL CADA 6 HORAS SEGUN SEA NECESARIO PARA SIBILANCIAS O FALTA DE AIRE 10/07/22   Georgina Quint, MD  amLODipine (NORVASC) 5 MG tablet TOMAR 1 TABLETA POR VAI ORAL UNA VEZ AL DIA 12/07/22   Georgina Quint, MD  aspirin EC 81 MG tablet Take 1 tablet (81 mg total) by mouth daily. Swallow whole. 09/01/22   Magnant, Charles L, PA-C  atorvastatin (LIPITOR) 20 MG tablet TOMAR 1 TABLETA POR VIA ORAL UNA VEZ AL DIA 10/07/22   Georgina Quint, MD  blood glucose meter kit and supplies KIT Dispense based on patient and insurance preference. Use up to four times daily as directed. 10/22/20   Georgina Quint, MD  blood glucose meter kit and supplies Dispense based on patient and insurance preference. Use up to four times daily as directed. (FOR ICD-10 E10.9, E11.9). 10/24/20   Georgina Quint, MD  Blood Glucose Monitoring Suppl (ONE TOUCH ULTRA 2) w/Device KIT Use as directed to check blood glucose up to 2 times a day 12/29/22   Georgina Quint, MD  Budeson-Glycopyrrol-Formoterol (BREZTRI AEROSPHERE) 160-9-4.8 MCG/ACT AERO INHALAR 2 BOCANADAS POR VIA ORAL DOS VECES AL DIA 06/16/23   Georgina Quint, MD  celecoxib (CELEBREX) 100 MG capsule Take 1 capsule (100 mg total) by mouth 2 (two) times daily. 08/09/23   Magnant, Charles L, PA-C  cyclobenzaprine (FLEXERIL) 10 MG tablet Take 1 tablet (10 mg total) by mouth 2 (two) times daily as needed for muscle spasms. 09/27/23   Fayrene Helper, PA-C  docusate sodium (COLACE) 100 MG capsule Take 1 capsule (100 mg total) by mouth 2 (two) times daily. 08/09/23   Magnant, Joycie Peek, PA-C  Dulaglutide (TRULICITY) 1.5 MG/0.5ML SOAJ INYECTAR CONTENIDOS DE UN LAPIZ POR VIA SUBCUTANEA CADA [place], EL MISMO DIA CADA [facility] 07/18/23   Georgina Quint, MD  FARXIGA 10 MG TABS tablet TOMAR 1 TABLETA POR VIA ORAL  UNA VEZ AL DIA 02/06/23   Georgina Quint, MD  gabapentin (NEURONTIN) 300 MG capsule Take 1 capsule (300 mg total) by mouth at bedtime as needed. 09/24/23   Magnant, Charles L, PA-C  glipiZIDE (GLUCOTROL) 10 MG tablet TOMAR 1 TABLETA POR VIA ORAL UNA VEZ AL DIA ANTES DEL DESAYUNO *TOMAR 1 TABLETA ADICIONAL SI AZUCAR EN LA SANGRE ESTA ALTA* 09/14/22   Sagardia, Eilleen Kempf, MD  HYDROcodone-acetaminophen (NORCO/VICODIN) 5-325 MG tablet Take 1 tablet by mouth every 6 (six) hours as needed for moderate pain (pain score 4-6). Do not take with Oxycodone 09/24/23   Magnant, Charles L, PA-C   ibuprofen (ADVIL) 800 MG tablet Take 1 tablet (800 mg total) by mouth every 8 (eight) hours as needed. 08/25/23   Magnant, Charles L, PA-C  insulin aspart (NOVOLOG FLEXPEN) 100 UNIT/ML FlexPen INYECTAR 3 UNIDADES POR VIA SUBCUTANEA TRES VECES AL DIA COMO INDICADO. AJUSTAR CANTIDAD DE INSULINA POR ESCALA MOVIL. MAS DOSIS 30 UNIDADES 08/02/23   Georgina Quint, MD  Insulin Pen Needle (B-D ULTRAFINE III SHORT PEN) 31G X 8 MM MISC USAR PARA INYECTAR INSULINA 10/13/21   Sagardia, Eilleen Kempf, MD  Insulin Pen Needle (B-D ULTRAFINE III SHORT PEN) 31G X 8 MM MISC USAR PARA INYECTAR INSULINA 10/13/21   Sagardia, Eilleen Kempf, MD  methocarbamol (ROBAXIN) 500 MG tablet Take 1 tablet (500 mg total) by mouth 2 (two) times daily. Patient not taking: Reported on 08/03/2023 04/14/23   Lorre Nick, MD  mupirocin ointment (BACTROBAN) 2 % Apply 1 Application topically daily. 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  Follow discharge instructions on paperwork for duration 08/09/23   Magnant, Charles L, PA-C  nicotine (NICODERM CQ) 14 mg/24hr patch Place 1 patch (14 mg total) onto the skin daily. Patient not taking: Reported on 08/03/2023 08/02/23   Georgina Quint, MD  [facility] ULTRA test strip USE TO CHECK BLOOD SUGAR AS INSTRUCTED 01/02/23   Georgina Quint, MD  pantoprazole (PROTONIX) 40 MG tablet TOMAR 1 TABLETA POR VIA ORAL UNA VEZ AL DIA 01/09/23   Georgina Quint, MD  sildenafil (VIAGRA) 100 MG tablet Take 0.5-1 tablets (50-100 mg total) by mouth daily as needed for erectile dysfunction. Patient taking differently: Take 50-100 mg by mouth daily as needed for erectile dysfunction. 12/29/22   Georgina Quint, MD  TRESIBA FLEXTOUCH 100 UNIT/ML FlexTouch Pen INYECTAR 25 UNIDADES POR VIA SUBCUTANEA DIARIAMENTE Patient taking differently: Inject 30 Units into the skin daily. 04/10/23   Georgina Quint, MD  Ubrogepant (UBRELVY) 100 MG TABS TOMAR 1 TALBETA POR VIA ORAL UNA VEZ AL  DIA 07/11/22   Georgina Quint, MD      Allergies    Patient has no known allergies.    Review of Systems   Review of Systems  Physical Exam Updated Vital Signs BP 110/77   Pulse 85   Temp 97.6 F (36.4 C) (Oral)   Resp 19   Ht 1.727 m (5\' 8" )   Wt 77.1 kg   SpO2 92%   BMI 25.85 kg/m  Physical Exam Vitals and nursing note reviewed.  Constitutional:      Appearance: Normal appearance.  HENT:     Head: Normocephalic and atraumatic.     Right Ear: External ear normal.     Left Ear: Tympanic membrane and external ear normal.     Nose: Nose normal.     Mouth/Throat:     Pharynx: Oropharynx is clear.  Eyes:  Pupils: Pupils are equal, round, and reactive to light.  Cardiovascular:     Rate and Rhythm: Normal rate and regular rhythm.     Pulses: Normal pulses.  Pulmonary:     Effort: Pulmonary effort is normal.  Abdominal:     General: Abdomen is flat.     Palpations: Abdomen is soft.  Musculoskeletal:        General: Normal range of motion.     Cervical back: Normal range of motion.  Skin:    General: Skin is warm and dry.     Capillary Refill: Capillary refill takes less than 2 seconds.  Neurological:     General: No focal deficit present.     Mental Status: He is alert.     Cranial Nerves: No cranial nerve deficit.     Sensory: No sensory deficit.     Motor: No weakness.     Coordination: Coordination normal.     Deep Tendon Reflexes: Reflexes normal.     Comments: No nystagmus noted      ED Results / Procedures / Treatments   Labs (all labs ordered are listed, but only abnormal results are displayed) Labs Reviewed  BASIC METABOLIC PANEL WITH GFR - Abnormal; Notable for the following components:      Result Value   Sodium 133 (*)    Glucose, Bld 166 (*)    All other components within normal limits  CBC - Abnormal; Notable for the following components:   Hemoglobin 17.1 (*)    HCT 53.7 (*)    All other components within normal limits   URINALYSIS, ROUTINE W REFLEX MICROSCOPIC  CBG MONITORING, ED    EKG None  Radiology No results found.  Procedures Procedures    Medications Ordered in ED Medications  diazepam (VALIUM) injection 2.5 mg (2.5 mg Intravenous Given 09/30/23 1414)  prochlorperazine (COMPAZINE) injection 5 mg (5 mg Intravenous Given 09/30/23 1413)  lactated ringers bolus 500 mL (500 mLs Intravenous New Bag/Given 09/30/23 1414)     ED Course/ Medical Decision Making/ A&P                                 Medical Decision Making Amount and/or Complexity of Data Reviewed Labs: ordered.  Risk Prescription drug management.  68 year old male who presents today with vertigo.  No other lateralized symptoms noted.  No nystagmus and negative Hallpike noted. Care discussed with Dr. Iver Nestle.  Did not feel that we need to repeat the CTA of the head and neck given that he is not having new neurosymptoms and had test 2 days ago. Patient given Valium and Compazine and symptoms have improved. Patient advised to return if his symptoms worsen.  Will treat with antibiotics based on recent sinusitis seen on CT. Discussed with Dr. Dierdre Harness-  To close the loop --  Discussed with Dr. Rosalia Hammers.  Recommendations: - No need to repeat CTA head and neck in the absence of any new neuro symptoms other than dizziness/nausea - Okay to treat nausea/dizziness conservatively with medications - If not improving, would obtain MRI brain w/o contrast to rule out CNS process   Will sign the plan of care note after discharged (just in case doesn't improve and then gets MRI and then we see him, would change to full consult)          Final Clinical Impression(s) / ED Diagnoses Final diagnoses:  Vertigo  Acute non-recurrent sinusitis, unspecified location  Rx / DC Orders ED Discharge Orders          Ordered    amoxicillin-clavulanate (AUGMENTIN) 875-125 MG tablet  Every 12 hours        09/30/23 1517    meclizine (ANTIVERT)  25 MG tablet  3 times daily PRN        09/30/23 1517              Margarita Grizzle, MD 09/30/23 1517

## 2023-10-02 ENCOUNTER — Ambulatory Visit
Admission: RE | Admit: 2023-10-02 | Discharge: 2023-10-02 | Disposition: A | Discharge status: Home / Self Care | Source: Ambulatory Visit | Attending: Surgical | Admitting: Surgical

## 2023-10-02 DIAGNOSIS — M542 Cervicalgia: Secondary | ICD-10-CM

## 2023-10-02 DIAGNOSIS — M5412 Radiculopathy, cervical region: Secondary | ICD-10-CM | POA: Diagnosis not present

## 2023-10-02 DIAGNOSIS — Z981 Arthrodesis status: Secondary | ICD-10-CM | POA: Diagnosis not present

## 2023-10-03 ENCOUNTER — Ambulatory Visit (INDEPENDENT_AMBULATORY_CARE_PROVIDER_SITE_OTHER): Admitting: Emergency Medicine

## 2023-10-03 ENCOUNTER — Encounter: Payer: Self-pay | Admitting: Emergency Medicine

## 2023-10-03 VITALS — BP 114/70 | HR 94 | Temp 98.5°F | Ht 68.0 in | Wt 164.0 lb

## 2023-10-03 DIAGNOSIS — I2510 Atherosclerotic heart disease of native coronary artery without angina pectoris: Secondary | ICD-10-CM

## 2023-10-03 DIAGNOSIS — E1169 Type 2 diabetes mellitus with other specified complication: Secondary | ICD-10-CM | POA: Diagnosis not present

## 2023-10-03 DIAGNOSIS — Z7985 Long-term (current) use of injectable non-insulin antidiabetic drugs: Secondary | ICD-10-CM

## 2023-10-03 DIAGNOSIS — J439 Emphysema, unspecified: Secondary | ICD-10-CM

## 2023-10-03 DIAGNOSIS — R42 Dizziness and giddiness: Secondary | ICD-10-CM

## 2023-10-03 DIAGNOSIS — E1159 Type 2 diabetes mellitus with other circulatory complications: Secondary | ICD-10-CM

## 2023-10-03 DIAGNOSIS — Z09 Encounter for follow-up examination after completed treatment for conditions other than malignant neoplasm: Secondary | ICD-10-CM

## 2023-10-03 DIAGNOSIS — E785 Hyperlipidemia, unspecified: Secondary | ICD-10-CM

## 2023-10-03 DIAGNOSIS — Z7984 Long term (current) use of oral hypoglycemic drugs: Secondary | ICD-10-CM | POA: Diagnosis not present

## 2023-10-03 DIAGNOSIS — I152 Hypertension secondary to endocrine disorders: Secondary | ICD-10-CM | POA: Diagnosis not present

## 2023-10-03 NOTE — Assessment & Plan Note (Signed)
 Well-controlled hypertension.  Continue amlodipine 5 mg daily Well-controlled diabetes with hemoglobin A1c of 6.7 Diet and nutrition discussed Cardiovascular risks associated with hypertension and diabetes discussed Continue weekly Trulicity 1.5 mg, daily Farxiga 10 mg, glipizide 10 mg before breakfast, Premeal insulin aspart as per sliding scale, and daily Tresiba 25 units

## 2023-10-03 NOTE — Assessment & Plan Note (Signed)
 Stable.  Continues to smoke.  Cardiovascular/cancer risks associated with smoking discussed.  Smoking cessation advice given Recommend starting NicoDerm patches Continues Breztri 2 inhalations twice a day and albuterol inhaler as needed

## 2023-10-03 NOTE — Progress Notes (Signed)
 Duane Cuevas 68 y.o.   Chief Complaint  Patient presents with   Hospitalization Follow-up    Patient went to ED on 3/26 3/29, patient states he is feeling better but still has the dizziness they told him it was vertigo due to his sinus. Patient had questions about his gabapentin he doesn't know why he has this     HISTORY OF PRESENT ILLNESS: This is a 68 y.o. male here for follow-up of emergency department visits when he presented twice on 09/27/2023 and 09/30/2023 complaining of headaches and vertigo CTA of head and neck unremarkable.  Report reviewed with patient. Had MRI of cervical spine done yesterday but report not available yet Feeling better today.  Accompanied by daughter.  Taking meclizine. Recently started on gabapentin 300 mg at bedtime No other complaints or medical concerns today. Was recently prescribed gabapentin 300 mg at bedtime.  Recently emergency department visit assessment and plan as follows:  ED Course/ Medical Decision Making/ A&P                               Medical Decision Making Amount and/or Complexity of Data Reviewed Labs: ordered.   Risk Prescription drug management.   68 year old male who presents today with vertigo.  No other lateralized symptoms noted.  No nystagmus and negative Hallpike noted. Care discussed with Dr. Iver Nestle.  Did not feel that we need to repeat the CTA of the head and neck given that he is not having new neurosymptoms and had test 2 days ago. Patient given Valium and Compazine and symptoms have improved. Patient advised to return if his symptoms worsen.  Will treat with antibiotics based on recent sinusitis seen on CT. Discussed with Dr. Dierdre Harness-   To close the loop --  Discussed with Dr. Rosalia Hammers.  Recommendations: - No need to repeat CTA head and neck in the absence of any new neuro symptoms other than dizziness/nausea - Okay to treat nausea/dizziness conservatively with medications - If not improving, would obtain MRI  brain w/o contrast to rule out CNS process   Will sign the plan of care note after discharged (just in case doesn't improve and then gets MRI and then we see him, would change to full consult)      HPI   Prior to Admission medications   Medication Sig Start Date End Date Taking? Authorizing Provider  albuterol (PROVENTIL) (2.5 MG/3ML) 0.083% nebulizer solution INHALAR 1 FRASCO VIA NEBULIZADOR CADA 6 HORAS SEGUN SEA NECESARIO PARA SIBILANCIAS O FALTA DE AIRE 12/07/22  Yes Danyla Wattley, Eilleen Kempf, MD  albuterol (VENTOLIN HFA) 108 (90 Base) MCG/ACT inhaler INHALAR 2 BOCANADAS POR VIA ORAL CADA 6 HORAS SEGUN SEA NECESARIO PARA SIBILANCIAS O FALTA DE AIRE 10/07/22  Yes Vuk Skillern, Eilleen Kempf, MD  amLODipine (NORVASC) 5 MG tablet TOMAR 1 TABLETA POR VAI ORAL UNA VEZ AL DIA 12/07/22  Yes Mahmood Boehringer, Eilleen Kempf, MD  amoxicillin-clavulanate (AUGMENTIN) 875-125 MG tablet Take 1 tablet by mouth every 12 (twelve) hours. 09/30/23  Yes Margarita Grizzle, MD  aspirin EC 81 MG tablet Take 1 tablet (81 mg total) by mouth daily. Swallow whole. 09/01/22  Yes Magnant, Charles L, PA-C  atorvastatin (LIPITOR) 20 MG tablet TOMAR 1 TABLETA POR VIA ORAL UNA VEZ AL DIA 10/07/22  Yes Jezreel Sisk, Eilleen Kempf, MD  blood glucose meter kit and supplies KIT Dispense based on patient and insurance preference. Use up to four times daily as directed. 10/22/20  Yes Jordi Lacko, Irving Shows  Elita Quick, MD  blood glucose meter kit and supplies Dispense based on patient and insurance preference. Use up to four times daily as directed. (FOR ICD-10 E10.9, E11.9). 10/24/20  Yes Allona Gondek, Eilleen Kempf, MD  Blood Glucose Monitoring Suppl (ONE TOUCH ULTRA 2) w/Device KIT Use as directed to check blood glucose up to 2 times a day 12/29/22  Yes Syra Sirmons, Eilleen Kempf, MD  Budeson-Glycopyrrol-Formoterol (BREZTRI AEROSPHERE) 160-9-4.8 MCG/ACT AERO INHALAR 2 BOCANADAS POR VIA ORAL DOS VECES AL DIA 06/16/23  Yes Eowyn Tabone, Eilleen Kempf, MD  celecoxib (CELEBREX) 100 MG capsule Take 1  capsule (100 mg total) by mouth 2 (two) times daily. 08/09/23  Yes Magnant, Charles L, PA-C  cyclobenzaprine (FLEXERIL) 10 MG tablet Take 1 tablet (10 mg total) by mouth 2 (two) times daily as needed for muscle spasms. 09/27/23  Yes Fayrene Helper, PA-C  docusate sodium (COLACE) 100 MG capsule Take 1 capsule (100 mg total) by mouth 2 (two) times daily. 08/09/23  Yes Magnant, Joycie Peek, PA-C  Dulaglutide (TRULICITY) 1.5 MG/0.5ML SOAJ INYECTAR CONTENIDOS DE UN LAPIZ POR VIA SUBCUTANEA CADA [place], EL MISMO DIA CADA [facility] 07/18/23  Yes Lavoris Sparling, Eilleen Kempf, MD  FARXIGA 10 MG TABS tablet TOMAR 1 TABLETA POR VIA ORAL UNA VEZ AL DIA 02/06/23  Yes Delno Blaisdell, Eilleen Kempf, MD  gabapentin (NEURONTIN) 300 MG capsule Take 1 capsule (300 mg total) by mouth at bedtime as needed. 09/24/23  Yes Magnant, Charles L, PA-C  glipiZIDE (GLUCOTROL) 10 MG tablet TOMAR 1 TABLETA POR VIA ORAL UNA VEZ AL DIA ANTES DEL DESAYUNO *TOMAR 1 TABLETA ADICIONAL SI AZUCAR EN LA SANGRE ESTA ALTA* 09/14/22  Yes Veida Spira, Eilleen Kempf, MD  HYDROcodone-acetaminophen (NORCO/VICODIN) 5-325 MG tablet Take 1 tablet by mouth every 6 (six) hours as needed for moderate pain (pain score 4-6). Do not take with Oxycodone 09/24/23  Yes Magnant, Charles L, PA-C  ibuprofen (ADVIL) 800 MG tablet Take 1 tablet (800 mg total) by mouth every 8 (eight) hours as needed. 08/25/23  Yes Magnant, Charles L, PA-C  insulin aspart (NOVOLOG FLEXPEN) 100 UNIT/ML FlexPen INYECTAR 3 UNIDADES POR VIA SUBCUTANEA TRES VECES AL DIA COMO INDICADO. AJUSTAR CANTIDAD DE INSULINA POR ESCALA MOVIL. MAS DOSIS 30 UNIDADES 08/02/23  Yes Brittane Grudzinski, Eilleen Kempf, MD  Insulin Pen Needle (B-D ULTRAFINE III SHORT PEN) 31G X 8 MM MISC USAR PARA INYECTAR INSULINA 10/13/21  Yes Kayzen Kendzierski, Eilleen Kempf, MD  Insulin Pen Needle (B-D ULTRAFINE III SHORT PEN) 31G X 8 MM MISC USAR PARA INYECTAR INSULINA 10/13/21  Yes Lizann Edelman, Eilleen Kempf, MD  meclizine (ANTIVERT) 25 MG tablet Take 1 tablet (25 mg total) by mouth 3  (three) times daily as needed for dizziness. 09/30/23  Yes Margarita Grizzle, MD  mupirocin ointment (BACTROBAN) 2 % Apply 1 Application topically daily. 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  Follow discharge instructions on paperwork for duration 08/09/23  Yes Magnant, Joycie Peek, PA-C  ONETOUCH ULTRA test strip USE TO CHECK BLOOD SUGAR AS INSTRUCTED 01/02/23  Yes Georgina Quint, MD  pantoprazole (PROTONIX) 40 MG tablet TOMAR 1 TABLETA POR VIA ORAL UNA VEZ AL DIA 01/09/23  Yes Lathyn Griggs, Eilleen Kempf, MD  sildenafil (VIAGRA) 100 MG tablet Take 0.5-1 tablets (50-100 mg total) by mouth daily as needed for erectile dysfunction. Patient taking differently: Take 50-100 mg by mouth daily as needed for erectile dysfunction. 12/29/22  Yes Riverlyn Kizziah, Eilleen Kempf, MD  TRESIBA FLEXTOUCH 100 UNIT/ML FlexTouch Pen INYECTAR 25 UNIDADES POR VIA SUBCUTANEA DIARIAMENTE Patient taking differently: Inject 30  Units into the skin daily. 04/10/23  Yes Ndidi Nesby, Eilleen Kempf, MD  methocarbamol (ROBAXIN) 500 MG tablet Take 1 tablet (500 mg total) by mouth 2 (two) times daily. Patient not taking: Reported on 08/03/2023 04/14/23   Lorre Nick, MD  nicotine (NICODERM CQ) 14 mg/24hr patch Place 1 patch (14 mg total) onto the skin daily. Patient not taking: Reported on 08/03/2023 08/02/23   Georgina Quint, MD  Ubrogepant (UBRELVY) 100 MG TABS TOMAR 1 TALBETA POR VIA ORAL UNA VEZ AL DIA Patient not taking: Reported on 10/03/2023 07/11/22   Georgina Quint, MD    No Known Allergies  Patient Active Problem List   Diagnosis Date Noted   Vertigo 09/30/2023   Arthritis of right shoulder region 08/20/2023   S/P reverse total shoulder arthroplasty, right 08/08/2023   Hematoma of right shoulder 04/17/2023   Contusion of right shoulder 04/17/2023   Rotator cuff arthropathy, left 08/31/2022   S/P reverse total shoulder arthroplasty, left 08/30/2022   Intractable persistent migraine aura without cerebral  infarction and with status migrainosus 05/26/2021   Hypertension associated with diabetes (HCC) 09/21/2020   Uncontrolled type 2 diabetes mellitus with hyperglycemia (HCC) 09/13/2020   CAD (coronary artery disease) 01/23/2020   HLD (hyperlipidemia) 11/02/2019   History of MI (myocardial infarction) 11/02/2019   COPD ? GOLD III/ active smoker 08/14/2018   COPD (chronic obstructive pulmonary disease) (HCC) 10/14/2014   Dyslipidemia associated with type 2 diabetes mellitus (HCC) 10/14/2014    Past Medical History:  Diagnosis Date   Cerebral aneurysm, nonruptured    followed by dr n. 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   Coronary artery disease    (pt admitted for chest pain ) nuclear stress test 11-02-2019  low risk, nuclear ef 58%,  prior inferior defect of severe severity indicative of previous MI;;   echo 04-28-2020 ef 60-65%, mild concentric LVH, RSVP 54.34mmHg;;   cardiac CTA 09-15-2020  unremarkable,  calcium score=zero   Dependence on nocturnal oxygen therapy    02-27-2023  per pt daughter,  pt only uses at night w/ O2 sat ,2-3L via [place],  only uses portable if going outside when every hot which is not much   Diverticulosis of colon    DOE (dyspnea on exertion)    02-27-2023  per pt daugter sob w/ stairs, incline's,  ok w/ some yard work if not warm/ hot wearther,  can do house hold chores   ED (erectile dysfunction)    Emphysema/COPD (HCC)    followed by pcp;   uses breztri bid, uses preventil nebulizer every afternoon;   last exacerbation 06/ 2023 in epic   Full dentures    GERD (gastroesophageal reflux disease)    Hepatic steatosis    History of adenomatous polyp of colon    History of gastric ulcer    per EGD 09-16-2020  non-bleeding   History of MI (myocardial infarction)    per nuclear stress test in epic 11-02-2019 showed prior inferior defect of severe severity indicative of previous MI    History of pelvic fracture    1999  and 2009   Hyperlipidemia    Hypertension    Smokers' cough (HCC)    Type 2 diabetes mellitus treated with insulin (HCC)    followed by pcp;  dx 2016   (02-27-2023  per pt daughter pt   Wears glasses     Past Surgical History:  Procedure Laterality Date  BICEPT TENODESIS Right 08/08/2023   Procedure: BICEPS TENODESIS;  Surgeon: Cammy Copa, MD;  Location: [facility] OR;  Service: Orthopedics;  Laterality: Right;   BIOPSY  09/16/2020   Procedure: BIOPSY;  Surgeon: Tressia Danas, MD;  Location: [facility];  Service: Gastroenterology;;   CHOLECYSTECTOMY N/A 03/20/2022   Procedure: LAPAROSCOPIC CHOLECYSTECTOMY;  Surgeon: Darnell Level, MD;  Location: [facility] ORS;  Service: General;  Laterality: N/A;   COLONOSCOPY WITH PROPOFOL  05/10/2018   dr Adela Lank   ESOPHAGOGASTRODUODENOSCOPY N/A 09/23/2015   Procedure: ESOPHAGOGASTRODUODENOSCOPY (EGD);  Surgeon: Meryl Dare, MD;  Location: [facility];  Service: Endoscopy;  Laterality: N/A;   ESOPHAGOGASTRODUODENOSCOPY (EGD) WITH PROPOFOL N/A 09/16/2020   Procedure: ESOPHAGOGASTRODUODENOSCOPY (EGD) WITH PROPOFOL;  Surgeon: Tressia Danas, MD;  Location: [facility];  Service: Gastroenterology;  Laterality: N/A;   EXCISION MASS NECK Left 03/09/2023   Procedure: EXCISION SOFT TISSUE MASS LEFT POSTERIOR NECK;  Surgeon: Darnell Level, MD;  Location: [facility];  Service: General;  Laterality: Left;   FOOT SURGERY Left 1999   INGUINAL HERNIA REPAIR Left 05/15/2018   Procedure: LEFT INGUINAL HERNIA REPAIR WITH MESH;  Surgeon: Harriette Bouillon, MD;  Location: [facility] OR;  Service: General;  Laterality: Left;   INSERTION OF MESH Left 05/15/2018   Procedure: INSERTION OF MESH;  Surgeon: Harriette Bouillon, MD;  Location: [facility] OR;  Service: General;  Laterality: Left;   INTRAOPERATIVE CHOLANGIOGRAM N/A 03/20/2022   Procedure: INTRAOPERATIVE CHOLANGIOGRAM;  Surgeon: Darnell Level, MD;  Location: [facility];  Service: General;   Laterality: N/A;   REVERSE SHOULDER ARTHROPLASTY Left 08/30/2022   Procedure: LEFT REVERSE SHOULDER ARTHROPLASTY;  Surgeon: Cammy Copa, MD;  Location: [facility] OR;  Service: Orthopedics;  Laterality: Left;   REVERSE SHOULDER ARTHROPLASTY Right 08/08/2023   Procedure: RIGHT REVERSE SHOULDER ARTHROPLASTY;  Surgeon: Cammy Copa, MD;  Location: [facility] OR;  Service: Orthopedics;  Laterality: Right;   SHOULDER ARTHROSCOPY W/ ROTATOR CUFF REPAIR Left    yrs ago    Social History   Socioeconomic History   Marital status: Significant Other    Spouse name: Zoraida   Number of children: 6   Years of education: Not on file   Highest education level: 12th grade  Occupational History   Occupation: Employed    Associate Professor: [employer]  Tobacco Use   Smoking status: Every Day    Current packs/day: 0.15    Average packs/day: 0.2 packs/day for 54.2 years (8.1 ttl pk-yrs)    Types: Cigarettes    Start date: 1971   Smokeless tobacco: Never   Tobacco comments:    02-27-2023  per pt daughter pt is still smoking, but hides how much per day,  smoking since age 54 (22)  Vaping Use   Vaping status: Never Used  Substance and Sexual Activity   Alcohol use: Not Currently    Alcohol/week: 2.0 standard drinks of alcohol    Types: 2 Cans of beer per week    Comment: 2 beers per week   Drug use: Never   Sexual activity: Yes  Other Topics Concern   Not on file  Social History Narrative   Lives with wife.  Employed full time as a Designer, fashion/clothing.  6 children.   Social Drivers of Health   Financial Resource Strain: Medium Risk (08/02/2023)   Overall Financial Resource Strain (CARDIA)    Difficulty of Paying Living Expenses: Somewhat hard  Food Insecurity: Unknown (04/15/2023)   Hunger Vital Sign    Worried About Running Out of Food in the  Last Year: Never true    Ran Out of Food in the Last Year: Patient declined  Transportation Needs: No Transportation Needs (08/02/2023)   PRAPARE - Therapist, art (Medical): No    Lack of Transportation (Non-Medical): No  Physical Activity: Insufficiently Active (08/02/2023)   Exercise Vital Sign    Days of Exercise per Week: 1 day    Minutes of Exercise per Session: 10 min  Stress: No Stress Concern Present (08/02/2023)   [employer] of Occupational Health - Occupational Stress Questionnaire    Feeling of Stress : Not at all  Social Connections: Socially Integrated (08/02/2023)   Social Connection and Isolation Panel [NHANES]    Frequency of Communication with Friends and Family: More than three times a week    Frequency of Social Gatherings with Friends and Family: Three times a week    Attends Religious Services: 1 to 4 times per year    Active Member of Clubs or Organizations: No    Attends Engineer, structural: More than 4 times per year    Marital Status: Married  Catering manager Violence: Not At Risk (12/28/2022)   Humiliation, Afraid, Rape, and Kick questionnaire    Fear of Current or Ex-Partner: No    Emotionally Abused: No    Physically Abused: No    Sexually Abused: No    Family History  Problem Relation Age of Onset   Emphysema Mother    Diabetes Mother    Emphysema Father    Cancer Sister        Stomach cancer   Diabetes Sister    Stomach cancer Sister    Diabetes Brother    Colon cancer Neg Hx    Colon polyps Neg Hx    Esophageal cancer Neg Hx    Rectal cancer Neg Hx      Review of Systems  Constitutional: Negative.  Negative for chills and fever.  HENT: Negative.  Negative for congestion and sore throat.   Respiratory: Negative.  Negative for cough and shortness of breath.   Cardiovascular: Negative.  Negative for chest pain and palpitations.  Gastrointestinal:  Negative for abdominal pain, diarrhea, nausea and vomiting.  Genitourinary: Negative.  Negative for dysuria and hematuria.  Skin: Negative.  Negative for rash.  Neurological:  Positive for dizziness and headaches.   All other systems reviewed and are negative.   Vitals:   10/03/23 0929  BP: 114/70  Pulse: 94  Temp: 98.5 F (36.9 C)  SpO2: 94%    Physical Exam Vitals reviewed.  Constitutional:      Appearance: Normal appearance.  HENT:     Head: Normocephalic.     Mouth/Throat:     Mouth: Mucous membranes are moist.     Pharynx: Oropharynx is clear.  Eyes:     Extraocular Movements: Extraocular movements intact.     Conjunctiva/sclera: Conjunctivae normal.     Pupils: Pupils are equal, round, and reactive to light.  Cardiovascular:     Rate and Rhythm: Normal rate and regular rhythm.     Pulses: Normal pulses.     Heart sounds: Normal heart sounds.  Pulmonary:     Effort: Pulmonary effort is normal.     Breath sounds: Normal breath sounds.  Musculoskeletal:     Cervical back: No tenderness.  Lymphadenopathy:     Cervical: No cervical adenopathy.  Skin:    General: Skin is warm and dry.     Capillary Refill: Capillary refill  takes less than 2 seconds.  Neurological:     General: No focal deficit present.     Mental Status: He is alert and oriented to person, place, and time.  Psychiatric:        Mood and Affect: Mood normal.        Behavior: Behavior normal.      ASSESSMENT & PLAN: A total of 46-minute was spent with the patient and counseling/coordination of care regarding preparing for this visit, review of most recent office visit notes, review of multiple chronic medical conditions and their management, review of most recent emergency department visit notes, review of most recent brain and neck imaging reports, review of all medications, review of most recent bloodwork results, review of health maintenance items, education on nutrition, prognosis, documentation, and need for follow up.   Problem List Items Addressed This Visit       Cardiovascular and Mediastinum   CAD (coronary artery disease)   Stable without recent anginal episodes Continue daily baby aspirin and  atorvastatin 20 mg daily      Hypertension associated with diabetes (HCC)   Well-controlled hypertension.  Continue amlodipine 5 mg daily Well-controlled diabetes with hemoglobin A1c of 6.7 Diet and nutrition discussed Cardiovascular risks associated with hypertension and diabetes discussed Continue weekly Trulicity 1.5 mg, daily Farxiga 10 mg, glipizide 10 mg before breakfast, Premeal insulin aspart as per sliding scale, and daily Tresiba 25 units        Respiratory   COPD (chronic obstructive pulmonary disease) (HCC)   Stable.  Continues to smoke.  Cardiovascular/cancer risks associated with smoking discussed.  Smoking cessation advice given Recommend starting NicoDerm patches Continues Breztri 2 inhalations twice a day and albuterol inhaler as needed        Endocrine   Dyslipidemia associated with type 2 diabetes mellitus (HCC)   Chronic stable conditions Continue atorvastatin 20 mg daily Diet and nutrition discussed        Other   Vertigo - Primary   Much improved today.  Clinically stable.  No red flag signs or symptoms. Continues meclizine 25 mg 2 or 3 times a day as needed Clinical picture has not changed Recent CTA of brain and neck reviewed with patient.  No significant findings. Had cervical spine MRI done yesterday.  Report not available yet No concerns today.      Other Visit Diagnoses       Hospital discharge follow-up          Patient Instructions  Health Maintenance After Age 13 After age 16, you are at a higher risk for certain long-term diseases and infections as well as injuries from falls. Falls are a major cause of broken bones and head injuries in people who are older than age 19. Getting regular preventive care can help to keep you healthy and well. Preventive care includes getting regular testing and making lifestyle changes as recommended by your health care provider. Talk with your health care provider about: Which screenings and tests you  should have. A screening is a test that checks for a disease when you have no symptoms. A diet and exercise plan that is right for you. What should I know about screenings and tests to prevent falls? Screening and testing are the best ways to find a health problem early. Early diagnosis and treatment give you the best chance of managing medical conditions that are common after age 24. Certain conditions and lifestyle choices may make you more likely to have a fall.  Your health care provider may recommend: Regular vision checks. Poor vision and conditions such as cataracts can make you more likely to have a fall. If you wear glasses, make sure to get your prescription updated if your vision changes. Medicine review. Work with your health care provider to regularly review all of the medicines you are taking, including over-the-counter medicines. Ask your health care provider about any side effects that may make you more likely to have a fall. Tell your health care provider if any medicines that you take make you feel dizzy or sleepy. Strength and balance checks. Your health care provider may recommend certain tests to check your strength and balance while standing, walking, or changing positions. Foot health exam. Foot pain and numbness, as well as not wearing proper footwear, can make you more likely to have a fall. Screenings, including: Osteoporosis screening. Osteoporosis is a condition that causes the bones to get weaker and break more easily. Blood pressure screening. Blood pressure changes and medicines to control blood pressure can make you feel dizzy. Depression screening. You may be more likely to have a fall if you have a fear of falling, feel depressed, or feel unable to do activities that you used to do. Alcohol use screening. Using too much alcohol can affect your balance and may make you more likely to have a fall. Follow these instructions at home: Lifestyle Do not drink alcohol  if: Your health care provider tells you not to drink. If you drink alcohol: Limit how much you have to: 0-1 drink a day for women. 0-2 drinks a day for men. Know how much alcohol is in your drink. In the [place] one drink equals one 12 oz bottle of beer (355 mL), one 5 oz glass of wine (148 mL), or one 1 oz glass of hard liquor (44 mL). Do not use any products that contain nicotine or tobacco. These products include cigarettes, chewing tobacco, and vaping devices, such as e-cigarettes. If you need help quitting, ask your health care provider. Activity  Follow a regular exercise program to stay fit. This will help you maintain your balance. Ask your health care provider what types of exercise are appropriate for you. If you need a cane or walker, use it as recommended by your health care provider. Wear supportive shoes that have nonskid soles. Safety  Remove any tripping hazards, such as rugs, cords, and clutter. Install safety equipment such as grab bars in bathrooms and safety rails on stairs. Keep rooms and walkways well-lit. General instructions Talk with your health care provider about your risks for falling. Tell your health care provider if: You fall. Be sure to tell your health care provider about all falls, even ones that seem minor. You feel dizzy, tiredness (fatigue), or off-balance. Take over-the-counter and prescription medicines only as told by your health care provider. These include supplements. Eat a healthy diet and maintain a healthy weight. A healthy diet includes low-fat dairy products, low-fat (lean) meats, and fiber from whole grains, beans, and lots of fruits and vegetables. Stay current with your vaccines. Schedule regular health, dental, and eye exams. Summary Having a healthy lifestyle and getting preventive care can help to protect your health and wellness after age 60. Screening and testing are the best way to find a health problem early and help you avoid  having a fall. Early diagnosis and treatment give you the best chance for managing medical conditions that are more common for people who are older than age 60.  Falls are a major cause of broken bones and head injuries in people who are older than age 70. Take precautions to prevent a fall at home. Work with your health care provider to learn what changes you can make to improve your health and wellness and to prevent falls. This information is not intended to replace advice given to you by your health care provider. Make sure you discuss any questions you have with your health care provider. Document Revised: 11/09/2020 Document Reviewed: 11/09/2020 Elsevier Patient Education  2024 Elsevier Inc.     Edwina Barth, MD [facility]

## 2023-10-03 NOTE — Assessment & Plan Note (Signed)
 Stable without recent anginal episodes Continue daily baby aspirin and atorvastatin 20 mg daily

## 2023-10-03 NOTE — Patient Instructions (Signed)
 Health Maintenance After Age 68 After age 4, you are at a higher risk for certain long-term diseases and infections as well as injuries from falls. Falls are a major cause of broken bones and head injuries in people who are older than age 47. Getting regular preventive care can help to keep you healthy and well. Preventive care includes getting regular testing and making lifestyle changes as recommended by your health care provider. Talk with your health care provider about: Which screenings and tests you should have. A screening is a test that checks for a disease when you have no symptoms. A diet and exercise plan that is right for you. What should I know about screenings and tests to prevent falls? Screening and testing are the best ways to find a health problem early. Early diagnosis and treatment give you the best chance of managing medical conditions that are common after age 37. Certain conditions and lifestyle choices may make you more likely to have a fall. Your health care provider may recommend: Regular vision checks. Poor vision and conditions such as cataracts can make you more likely to have a fall. If you wear glasses, make sure to get your prescription updated if your vision changes. Medicine review. Work with your health care provider to regularly review all of the medicines you are taking, including over-the-counter medicines. Ask your health care provider about any side effects that may make you more likely to have a fall. Tell your health care provider if any medicines that you take make you feel dizzy or sleepy. Strength and balance checks. Your health care provider may recommend certain tests to check your strength and balance while standing, walking, or changing positions. Foot health exam. Foot pain and numbness, as well as not wearing proper footwear, can make you more likely to have a fall. Screenings, including: Osteoporosis screening. Osteoporosis is a condition that causes  the bones to get weaker and break more easily. Blood pressure screening. Blood pressure changes and medicines to control blood pressure can make you feel dizzy. Depression screening. You may be more likely to have a fall if you have a fear of falling, feel depressed, or feel unable to do activities that you used to do. Alcohol use screening. Using too much alcohol can affect your balance and may make you more likely to have a fall. Follow these instructions at home: Lifestyle Do not drink alcohol if: Your health care provider tells you not to drink. If you drink alcohol: Limit how much you have to: 0-1 drink a day for women. 0-2 drinks a day for men. Know how much alcohol is in your drink. In the [place] one drink equals one 12 oz bottle of beer (355 mL), one 5 oz glass of wine (148 mL), or one 1 oz glass of hard liquor (44 mL). Do not use any products that contain nicotine or tobacco. These products include cigarettes, chewing tobacco, and vaping devices, such as e-cigarettes. If you need help quitting, ask your health care provider. Activity  Follow a regular exercise program to stay fit. This will help you maintain your balance. Ask your health care provider what types of exercise are appropriate for you. If you need a cane or walker, use it as recommended by your health care provider. Wear supportive shoes that have nonskid soles. Safety  Remove any tripping hazards, such as rugs, cords, and clutter. Install safety equipment such as grab bars in bathrooms and safety rails on stairs. Keep rooms and walkways  well-lit. General instructions Talk with your health care provider about your risks for falling. Tell your health care provider if: You fall. Be sure to tell your health care provider about all falls, even ones that seem minor. You feel dizzy, tiredness (fatigue), or off-balance. Take over-the-counter and prescription medicines only as told by your health care provider. These include  supplements. Eat a healthy diet and maintain a healthy weight. A healthy diet includes low-fat dairy products, low-fat (lean) meats, and fiber from whole grains, beans, and lots of fruits and vegetables. Stay current with your vaccines. Schedule regular health, dental, and eye exams. Summary Having a healthy lifestyle and getting preventive care can help to protect your health and wellness after age 11. Screening and testing are the best way to find a health problem early and help you avoid having a fall. Early diagnosis and treatment give you the best chance for managing medical conditions that are more common for people who are older than age 28. Falls are a major cause of broken bones and head injuries in people who are older than age 48. Take precautions to prevent a fall at home. Work with your health care provider to learn what changes you can make to improve your health and wellness and to prevent falls. This information is not intended to replace advice given to you by your health care provider. Make sure you discuss any questions you have with your health care provider. Document Revised: 11/09/2020 Document Reviewed: 11/09/2020 Elsevier Patient Education  2024 [employer].

## 2023-10-03 NOTE — Assessment & Plan Note (Signed)
 Chronic stable conditions Continue atorvastatin 20 mg daily Diet and nutrition discussed

## 2023-10-03 NOTE — Assessment & Plan Note (Signed)
 Much improved today.  Clinically stable.  No red flag signs or symptoms. Continues meclizine 25 mg 2 or 3 times a day as needed Clinical picture has not changed Recent CTA of brain and neck reviewed with patient.  No significant findings. Had cervical spine MRI done yesterday.  Report not available yet No concerns today.

## 2023-10-07 ENCOUNTER — Emergency Department (HOSPITAL_COMMUNITY)

## 2023-10-07 ENCOUNTER — Other Ambulatory Visit: Payer: Self-pay

## 2023-10-07 ENCOUNTER — Encounter (HOSPITAL_COMMUNITY): Payer: Self-pay | Admitting: Internal Medicine

## 2023-10-07 ENCOUNTER — Inpatient Hospital Stay (HOSPITAL_COMMUNITY)
Admission: EM | Admit: 2023-10-07 | Discharge: 2023-10-10 | DRG: 190 | Disposition: A | Discharge status: Home / Self Care | Attending: Internal Medicine | Admitting: Internal Medicine

## 2023-10-07 DIAGNOSIS — Z825 Family history of asthma and other chronic lower respiratory diseases: Secondary | ICD-10-CM

## 2023-10-07 DIAGNOSIS — Z9981 Dependence on supplemental oxygen: Secondary | ICD-10-CM | POA: Diagnosis not present

## 2023-10-07 DIAGNOSIS — J9811 Atelectasis: Secondary | ICD-10-CM | POA: Diagnosis not present

## 2023-10-07 DIAGNOSIS — Z862 Personal history of diseases of the blood and blood-forming organs and certain disorders involving the immune mechanism: Secondary | ICD-10-CM

## 2023-10-07 DIAGNOSIS — K760 Fatty (change of) liver, not elsewhere classified: Secondary | ICD-10-CM | POA: Diagnosis present

## 2023-10-07 DIAGNOSIS — E1169 Type 2 diabetes mellitus with other specified complication: Secondary | ICD-10-CM | POA: Diagnosis not present

## 2023-10-07 DIAGNOSIS — R079 Chest pain, unspecified: Secondary | ICD-10-CM

## 2023-10-07 DIAGNOSIS — F17210 Nicotine dependence, cigarettes, uncomplicated: Secondary | ICD-10-CM | POA: Diagnosis not present

## 2023-10-07 DIAGNOSIS — J439 Emphysema, unspecified: Secondary | ICD-10-CM | POA: Diagnosis present

## 2023-10-07 DIAGNOSIS — J440 Chronic obstructive pulmonary disease with acute lower respiratory infection: Secondary | ICD-10-CM | POA: Diagnosis not present

## 2023-10-07 DIAGNOSIS — Z96611 Presence of right artificial shoulder joint: Secondary | ICD-10-CM | POA: Diagnosis not present

## 2023-10-07 DIAGNOSIS — Z794 Long term (current) use of insulin: Secondary | ICD-10-CM | POA: Diagnosis not present

## 2023-10-07 DIAGNOSIS — R918 Other nonspecific abnormal finding of lung field: Secondary | ICD-10-CM | POA: Diagnosis not present

## 2023-10-07 DIAGNOSIS — R42 Dizziness and giddiness: Secondary | ICD-10-CM | POA: Diagnosis present

## 2023-10-07 DIAGNOSIS — Z79899 Other long term (current) drug therapy: Secondary | ICD-10-CM

## 2023-10-07 DIAGNOSIS — J441 Chronic obstructive pulmonary disease with (acute) exacerbation: Secondary | ICD-10-CM | POA: Diagnosis not present

## 2023-10-07 DIAGNOSIS — E785 Hyperlipidemia, unspecified: Secondary | ICD-10-CM | POA: Diagnosis not present

## 2023-10-07 DIAGNOSIS — J9611 Chronic respiratory failure with hypoxia: Secondary | ICD-10-CM | POA: Diagnosis not present

## 2023-10-07 DIAGNOSIS — J9621 Acute and chronic respiratory failure with hypoxia: Secondary | ICD-10-CM | POA: Diagnosis not present

## 2023-10-07 DIAGNOSIS — E1165 Type 2 diabetes mellitus with hyperglycemia: Secondary | ICD-10-CM | POA: Diagnosis present

## 2023-10-07 DIAGNOSIS — I10 Essential (primary) hypertension: Secondary | ICD-10-CM | POA: Diagnosis present

## 2023-10-07 DIAGNOSIS — Z1152 Encounter for screening for COVID-19: Secondary | ICD-10-CM | POA: Diagnosis not present

## 2023-10-07 DIAGNOSIS — K219 Gastro-esophageal reflux disease without esophagitis: Secondary | ICD-10-CM | POA: Diagnosis present

## 2023-10-07 DIAGNOSIS — Z8719 Personal history of other diseases of the digestive system: Secondary | ICD-10-CM

## 2023-10-07 DIAGNOSIS — Z7985 Long-term (current) use of injectable non-insulin antidiabetic drugs: Secondary | ICD-10-CM

## 2023-10-07 DIAGNOSIS — Z791 Long term (current) use of non-steroidal anti-inflammatories (NSAID): Secondary | ICD-10-CM

## 2023-10-07 DIAGNOSIS — Z7982 Long term (current) use of aspirin: Secondary | ICD-10-CM

## 2023-10-07 DIAGNOSIS — J189 Pneumonia, unspecified organism: Principal | ICD-10-CM | POA: Diagnosis present

## 2023-10-07 DIAGNOSIS — I671 Cerebral aneurysm, nonruptured: Secondary | ICD-10-CM | POA: Diagnosis not present

## 2023-10-07 DIAGNOSIS — Z96612 Presence of left artificial shoulder joint: Secondary | ICD-10-CM | POA: Diagnosis present

## 2023-10-07 DIAGNOSIS — I252 Old myocardial infarction: Secondary | ICD-10-CM | POA: Diagnosis not present

## 2023-10-07 DIAGNOSIS — Z9889 Other specified postprocedural states: Secondary | ICD-10-CM

## 2023-10-07 DIAGNOSIS — Z833 Family history of diabetes mellitus: Secondary | ICD-10-CM

## 2023-10-07 DIAGNOSIS — Z860101 Personal history of adenomatous and serrated colon polyps: Secondary | ICD-10-CM

## 2023-10-07 DIAGNOSIS — Z800 Family history of malignant neoplasm of digestive organs: Secondary | ICD-10-CM

## 2023-10-07 DIAGNOSIS — Z7951 Long term (current) use of inhaled steroids: Secondary | ICD-10-CM

## 2023-10-07 DIAGNOSIS — Z471 Aftercare following joint replacement surgery: Secondary | ICD-10-CM | POA: Diagnosis not present

## 2023-10-07 DIAGNOSIS — Z9049 Acquired absence of other specified parts of digestive tract: Secondary | ICD-10-CM

## 2023-10-07 DIAGNOSIS — Z7984 Long term (current) use of oral hypoglycemic drugs: Secondary | ICD-10-CM

## 2023-10-07 DIAGNOSIS — I2510 Atherosclerotic heart disease of native coronary artery without angina pectoris: Secondary | ICD-10-CM | POA: Diagnosis not present

## 2023-10-07 DIAGNOSIS — Z8711 Personal history of peptic ulcer disease: Secondary | ICD-10-CM

## 2023-10-07 LAB — CBC WITH DIFFERENTIAL/PLATELET
Abs Immature Granulocytes: 0.06 10*3/uL (ref 0.00–0.07)
Basophils Absolute: 0 10*3/uL (ref 0.0–0.1)
Basophils Relative: 0 %
Eosinophils Absolute: 0.1 10*3/uL (ref 0.0–0.5)
Eosinophils Relative: 1 %
HCT: 46 % (ref 39.0–52.0)
Hemoglobin: 15.3 g/dL (ref 13.0–17.0)
Immature Granulocytes: 1 %
Lymphocytes Relative: 4 %
Lymphs Abs: 0.4 10*3/uL — ABNORMAL LOW (ref 0.7–4.0)
MCH: 30.2 pg (ref 26.0–34.0)
MCHC: 33.3 g/dL (ref 30.0–36.0)
MCV: 90.7 fL (ref 80.0–100.0)
Monocytes Absolute: 0.5 10*3/uL (ref 0.1–1.0)
Monocytes Relative: 5 %
Neutro Abs: 8.9 10*3/uL — ABNORMAL HIGH (ref 1.7–7.7)
Neutrophils Relative %: 89 %
Platelets: 247 10*3/uL (ref 150–400)
RBC: 5.07 MIL/uL (ref 4.22–5.81)
RDW: 13.8 % (ref 11.5–15.5)
WBC: 10 10*3/uL (ref 4.0–10.5)
nRBC: 0 % (ref 0.0–0.2)

## 2023-10-07 LAB — GLUCOSE, CAPILLARY
Glucose-Capillary: 313 mg/dL — ABNORMAL HIGH (ref 70–99)
Glucose-Capillary: 306 mg/dL — ABNORMAL HIGH (ref 70–99)

## 2023-10-07 LAB — EXPECTORATED SPUTUM ASSESSMENT W GRAM STAIN, RFLX TO RESP C

## 2023-10-07 LAB — RESP PANEL BY RT-PCR (RSV, FLU A&B, COVID)  RVPGX2
Influenza A by PCR: NEGATIVE
Influenza B by PCR: NEGATIVE
Resp Syncytial Virus by PCR: NEGATIVE
SARS Coronavirus 2 by RT PCR: NEGATIVE

## 2023-10-07 LAB — BASIC METABOLIC PANEL WITH GFR
Anion gap: 9 (ref 5–15)
BUN: 13 mg/dL (ref 8–23)
CO2: 24 mmol/L (ref 22–32)
Calcium: 8.6 mg/dL — ABNORMAL LOW (ref 8.9–10.3)
Chloride: 100 mmol/L (ref 98–111)
Creatinine, Ser: 0.78 mg/dL (ref 0.61–1.24)
GFR, Estimated: >60 mL/min (ref >60)
Glucose, Bld: 113 mg/dL — ABNORMAL HIGH (ref 70–99)
Potassium: 3.5 mmol/L (ref 3.5–5.1)
Sodium: 133 mmol/L — ABNORMAL LOW (ref 135–145)

## 2023-10-07 LAB — TROPONIN I (HIGH SENSITIVITY)
Troponin I (High Sensitivity): 4 ng/L (ref <18)
Troponin I (High Sensitivity): 4 ng/L (ref <18)

## 2023-10-07 LAB — PROCALCITONIN: Procalcitonin: 0.42 ng/mL

## 2023-10-07 LAB — PHOSPHORUS: Phosphorus: 2.4 mg/dL — ABNORMAL LOW (ref 2.5–4.6)

## 2023-10-07 LAB — MAGNESIUM: Magnesium: 1.6 mg/dL — ABNORMAL LOW (ref 1.7–2.4)

## 2023-10-07 LAB — BRAIN NATRIURETIC PEPTIDE: B Natriuretic Peptide: 141.1 pg/mL — ABNORMAL HIGH (ref 0.0–100.0)

## 2023-10-07 LAB — I-STAT CG4 LACTIC ACID, ED: Lactic Acid, Venous: 1.4 mmol/L (ref 0.5–1.9)

## 2023-10-07 MED ORDER — KETOROLAC TROMETHAMINE 30 MG/ML IJ SOLN
30.0000 mg | Freq: Once | INTRAMUSCULAR | Status: AC
  Administered 2023-10-07: 30 mg via INTRAVENOUS
  Filled 2023-10-07: qty 1

## 2023-10-07 MED ORDER — DAPAGLIFLOZIN PROPANEDIOL 10 MG PO TABS
10.0000 mg | ORAL_TABLET | Freq: Every day | ORAL | Status: DC
  Administered 2023-10-08 – 2023-10-10 (×3): 10 mg via ORAL
  Filled 2023-10-07 (×3): qty 1

## 2023-10-07 MED ORDER — PANTOPRAZOLE SODIUM 40 MG PO TBEC
40.0000 mg | DELAYED_RELEASE_TABLET | Freq: Every day | ORAL | Status: DC
  Administered 2023-10-07 – 2023-10-10 (×4): 40 mg via ORAL
  Filled 2023-10-07 (×4): qty 1

## 2023-10-07 MED ORDER — CYCLOBENZAPRINE HCL 10 MG PO TABS: 10.0000 mg | ORAL_TABLET | Freq: Two times a day (BID) | ORAL | Status: DC | PRN

## 2023-10-07 MED ORDER — ONDANSETRON HCL 4 MG PO TABS: 4.0000 mg | ORAL_TABLET | Freq: Four times a day (QID) | ORAL | Status: DC | PRN

## 2023-10-07 MED ORDER — INSULIN ASPART 100 UNIT/ML IJ SOLN
0.0000 [IU] | Freq: Three times a day (TID) | INTRAMUSCULAR | Status: DC
  Administered 2023-10-07: 11 [IU] via SUBCUTANEOUS

## 2023-10-07 MED ORDER — INSULIN GLARGINE-YFGN 100 UNIT/ML ~~LOC~~ SOLN
30.0000 [IU] | Freq: Every day | SUBCUTANEOUS | Status: DC
  Administered 2023-10-07 – 2023-10-10 (×4): 30 [IU] via SUBCUTANEOUS
  Filled 2023-10-07 (×4): qty 0.3

## 2023-10-07 MED ORDER — ONDANSETRON HCL 4 MG/2ML IJ SOLN: 4.0000 mg | Freq: Four times a day (QID) | INTRAMUSCULAR | Status: DC | PRN

## 2023-10-07 MED ORDER — ENOXAPARIN SODIUM 40 MG/0.4ML IJ SOSY
40.0000 mg | PREFILLED_SYRINGE | INTRAMUSCULAR | Status: DC
  Administered 2023-10-07 – 2023-10-09 (×3): 40 mg via SUBCUTANEOUS
  Filled 2023-10-07 (×3): qty 0.4

## 2023-10-07 MED ORDER — KETOROLAC TROMETHAMINE 15 MG/ML IJ SOLN
15.0000 mg | Freq: Once | INTRAMUSCULAR | Status: AC
  Administered 2023-10-07: 15 mg via INTRAVENOUS
  Filled 2023-10-07: qty 1

## 2023-10-07 MED ORDER — AMLODIPINE BESYLATE 5 MG PO TABS
5.0000 mg | ORAL_TABLET | Freq: Every day | ORAL | Status: DC
  Administered 2023-10-07 – 2023-10-10 (×4): 5 mg via ORAL
  Filled 2023-10-07 (×4): qty 1

## 2023-10-07 MED ORDER — PREDNISONE 20 MG PO TABS
40.0000 mg | ORAL_TABLET | Freq: Every day | ORAL | Status: DC
  Administered 2023-10-08: 40 mg via ORAL
  Filled 2023-10-07: qty 2

## 2023-10-07 MED ORDER — IPRATROPIUM-ALBUTEROL 0.5-2.5 (3) MG/3ML IN SOLN
3.0000 mL | Freq: Once | RESPIRATORY_TRACT | Status: AC
  Administered 2023-10-07: 3 mL via RESPIRATORY_TRACT
  Filled 2023-10-07: qty 3

## 2023-10-07 MED ORDER — SODIUM CHLORIDE 0.9 % IV BOLUS
500.0000 mL | Freq: Once | INTRAVENOUS | Status: AC
  Administered 2023-10-07: 500 mL via INTRAVENOUS

## 2023-10-07 MED ORDER — ACETAMINOPHEN 325 MG PO TABS
650.0000 mg | ORAL_TABLET | Freq: Four times a day (QID) | ORAL | Status: DC | PRN
  Administered 2023-10-08: 650 mg via ORAL
  Filled 2023-10-07: qty 2

## 2023-10-07 MED ORDER — HYDROCODONE BIT-HOMATROP MBR 5-1.5 MG/5ML PO SOLN
5.0000 mL | Freq: Four times a day (QID) | ORAL | Status: DC | PRN
  Administered 2023-10-07: 5 mL via ORAL
  Filled 2023-10-07: qty 5

## 2023-10-07 MED ORDER — MECLIZINE HCL 25 MG PO TABS
25.0000 mg | ORAL_TABLET | Freq: Three times a day (TID) | ORAL | Status: DC | PRN
Start: 2023-10-07 — End: 2023-10-10

## 2023-10-07 MED ORDER — SODIUM CHLORIDE 0.9 % IV SOLN
1.0000 g | INTRAVENOUS | Status: DC
  Filled 2023-10-07: qty 10

## 2023-10-07 MED ORDER — MORPHINE SULFATE (PF) 4 MG/ML IV SOLN
4.0000 mg | Freq: Once | INTRAVENOUS | Status: AC
  Administered 2023-10-07: 4 mg via INTRAVENOUS
  Filled 2023-10-07: qty 1

## 2023-10-07 MED ORDER — GLIPIZIDE 10 MG PO TABS
10.0000 mg | ORAL_TABLET | Freq: Every day | ORAL | Status: DC
  Administered 2023-10-08 – 2023-10-10 (×3): 10 mg via ORAL
  Filled 2023-10-07 (×3): qty 1

## 2023-10-07 MED ORDER — INSULIN ASPART 100 UNIT/ML IJ SOLN
0.0000 [IU] | Freq: Three times a day (TID) | INTRAMUSCULAR | Status: DC
  Administered 2023-10-07: 11 [IU] via SUBCUTANEOUS
  Administered 2023-10-08 (×2): 3 [IU] via SUBCUTANEOUS
  Administered 2023-10-08: 5 [IU] via SUBCUTANEOUS
  Administered 2023-10-08: 8 [IU] via SUBCUTANEOUS
  Administered 2023-10-09: 5 [IU] via SUBCUTANEOUS
  Administered 2023-10-09: 3 [IU] via SUBCUTANEOUS
  Administered 2023-10-09 (×2): 8 [IU] via SUBCUTANEOUS
  Administered 2023-10-10 (×2): 5 [IU] via SUBCUTANEOUS

## 2023-10-07 MED ORDER — POTASSIUM CHLORIDE CRYS ER 20 MEQ PO TBCR
40.0000 meq | EXTENDED_RELEASE_TABLET | Freq: Once | ORAL | Status: AC
  Administered 2023-10-07: 40 meq via ORAL
  Filled 2023-10-07: qty 2

## 2023-10-07 MED ORDER — ALBUTEROL SULFATE (2.5 MG/3ML) 0.083% IN NEBU
5.0000 mg | INHALATION_SOLUTION | Freq: Once | RESPIRATORY_TRACT | Status: AC
  Administered 2023-10-07: 5 mg via RESPIRATORY_TRACT
  Filled 2023-10-07: qty 6

## 2023-10-07 MED ORDER — ASPIRIN 81 MG PO TBEC
81.0000 mg | DELAYED_RELEASE_TABLET | Freq: Every day | ORAL | Status: DC
  Administered 2023-10-07 – 2023-10-10 (×4): 81 mg via ORAL
  Filled 2023-10-07 (×4): qty 1

## 2023-10-07 MED ORDER — SODIUM CHLORIDE 0.9 % IV SOLN
500.0000 mg | Freq: Once | INTRAVENOUS | Status: AC
  Administered 2023-10-07: 500 mg via INTRAVENOUS
  Filled 2023-10-07: qty 5

## 2023-10-07 MED ORDER — HYDROCODONE BIT-HOMATROP MBR 5-1.5 MG/5ML PO SOLN
5.0000 mL | Freq: Once | ORAL | Status: AC
  Administered 2023-10-07: 5 mL via ORAL
  Filled 2023-10-07: qty 5

## 2023-10-07 MED ORDER — ACETAMINOPHEN 650 MG RE SUPP: 650.0000 mg | Freq: Four times a day (QID) | RECTAL | Status: DC | PRN

## 2023-10-07 MED ORDER — INSULIN ASPART 100 UNIT/ML IJ SOLN: 0.0000 [IU] | Freq: Three times a day (TID) | INTRAMUSCULAR | Status: DC

## 2023-10-07 MED ORDER — SODIUM CHLORIDE 0.9 % IV SOLN
500.0000 mg | INTRAVENOUS | Status: DC
  Administered 2023-10-08 – 2023-10-10 (×3): 500 mg via INTRAVENOUS
  Filled 2023-10-07 (×3): qty 5

## 2023-10-07 MED ORDER — ONDANSETRON HCL 4 MG/2ML IJ SOLN
4.0000 mg | Freq: Once | INTRAMUSCULAR | Status: AC
  Administered 2023-10-07: 4 mg via INTRAVENOUS
  Filled 2023-10-07: qty 2

## 2023-10-07 MED ORDER — MAGNESIUM SULFATE 2 GM/50ML IV SOLN
2.0000 g | Freq: Once | INTRAVENOUS | Status: AC
  Administered 2023-10-07: 2 g via INTRAVENOUS
  Filled 2023-10-07: qty 50

## 2023-10-07 MED ORDER — ACETAMINOPHEN 500 MG PO TABS
1000.0000 mg | ORAL_TABLET | Freq: Once | ORAL | Status: AC
  Administered 2023-10-07: 1000 mg via ORAL
  Filled 2023-10-07: qty 2

## 2023-10-07 MED ORDER — METHYLPREDNISOLONE SODIUM SUCC 125 MG IJ SOLR
125.0000 mg | Freq: Once | INTRAMUSCULAR | Status: AC
  Administered 2023-10-07: 125 mg via INTRAVENOUS
  Filled 2023-10-07: qty 2

## 2023-10-07 MED ORDER — SODIUM CHLORIDE 0.9 % IV SOLN
1.0000 g | Freq: Once | INTRAVENOUS | Status: AC
  Administered 2023-10-07: 1 g via INTRAVENOUS
  Filled 2023-10-07: qty 10

## 2023-10-07 MED ORDER — ATORVASTATIN CALCIUM 20 MG PO TABS
20.0000 mg | ORAL_TABLET | Freq: Every day | ORAL | Status: DC
  Administered 2023-10-07 – 2023-10-10 (×4): 20 mg via ORAL
  Filled 2023-10-07 (×4): qty 1

## 2023-10-07 MED ORDER — INSULIN DEGLUDEC 100 UNIT/ML ~~LOC~~ SOPN: 30.0000 [IU] | PEN_INJECTOR | Freq: Every day | SUBCUTANEOUS | Status: DC

## 2023-10-07 NOTE — ED Provider Notes (Addendum)
  EMERGENCY DEPARTMENT AT [facility] Provider Note   CSN: [id number] Arrival date & time: 10/07/23  7829     History  Chief Complaint  Patient presents with   Cough   Shortness of Breath    Duane Cuevas is a 68 y.o. male.  Pt is a 68 yo male with pmhx significant for COPD (on 2L), DM2, CAD, HTN, HLD, GERD, tobacco abuse and vertigo.  Pt said he's had a cough for 3 days.  He's not been able to breathe.  He was not aware he had a fever.  He has run out of his home O2 oxygen tanks.  Pt speaks Spanish.  He refused a video interpreter and wanted his son to translate.       Home Medications Prior to Admission medications   Medication Sig Start Date End Date Taking? Authorizing Provider  albuterol (PROVENTIL) (2.5 MG/3ML) 0.083% nebulizer solution INHALAR 1 FRASCO VIA NEBULIZADOR CADA 6 HORAS SEGUN SEA NECESARIO PARA SIBILANCIAS O FALTA DE AIRE 12/07/22   Georgina Quint, MD  albuterol (VENTOLIN HFA) 108 (90 Base) MCG/ACT inhaler INHALAR 2 BOCANADAS POR VIA ORAL CADA 6 HORAS SEGUN SEA NECESARIO PARA SIBILANCIAS O FALTA DE AIRE 10/07/22   Sagardia, Eilleen Kempf, MD  amLODipine (NORVASC) 5 MG tablet TOMAR 1 TABLETA POR VAI ORAL UNA VEZ AL DIA 12/07/22   Georgina Quint, MD  amoxicillin-clavulanate (AUGMENTIN) 875-125 MG tablet Take 1 tablet by mouth every 12 (twelve) hours. 09/30/23   Margarita Grizzle, MD  aspirin EC 81 MG tablet Take 1 tablet (81 mg total) by mouth daily. Swallow whole. 09/01/22   Magnant, Charles L, PA-C  atorvastatin (LIPITOR) 20 MG tablet TOMAR 1 TABLETA POR VIA ORAL UNA VEZ AL DIA 10/07/22   Georgina Quint, MD  blood glucose meter kit and supplies KIT Dispense based on patient and insurance preference. Use up to four times daily as directed. 10/22/20   Georgina Quint, MD  blood glucose meter kit and supplies Dispense based on patient and insurance preference. Use up to four times daily as directed. (FOR ICD-10 E10.9, E11.9). 10/24/20    Georgina Quint, MD  Blood Glucose Monitoring Suppl (ONE TOUCH ULTRA 2) w/Device KIT Use as directed to check blood glucose up to 2 times a day 12/29/22   Georgina Quint, MD  Budeson-Glycopyrrol-Formoterol (BREZTRI AEROSPHERE) 160-9-4.8 MCG/ACT AERO INHALAR 2 BOCANADAS POR VIA ORAL DOS VECES AL DIA 06/16/23   Georgina Quint, MD  celecoxib (CELEBREX) 100 MG capsule Take 1 capsule (100 mg total) by mouth 2 (two) times daily. 08/09/23   Magnant, Charles L, PA-C  cyclobenzaprine (FLEXERIL) 10 MG tablet Take 1 tablet (10 mg total) by mouth 2 (two) times daily as needed for muscle spasms. 09/27/23   Fayrene Helper, PA-C  docusate sodium (COLACE) 100 MG capsule Take 1 capsule (100 mg total) by mouth 2 (two) times daily. 08/09/23   Magnant, Joycie Peek, PA-C  Dulaglutide (TRULICITY) 1.5 MG/0.5ML Sula Soda CONTENIDOS DE UN LAPIZ POR VIA SUBCUTANEA CADA [place], EL MISMO DIA CADA [facility] 07/18/23   Georgina Quint, MD  FARXIGA 10 MG TABS tablet TOMAR 1 TABLETA POR VIA ORAL UNA VEZ AL DIA 02/06/23   Georgina Quint, MD  gabapentin (NEURONTIN) 300 MG capsule Take 1 capsule (300 mg total) by mouth at bedtime as needed. 09/24/23   Magnant, Charles L, PA-C  glipiZIDE (GLUCOTROL) 10 MG tablet TOMAR 1 TABLETA POR VIA ORAL UNA VEZ AL DIA ANTES DEL  DESAYUNO *TOMAR 1 TABLETA ADICIONAL SI AZUCAR EN LA SANGRE ESTA ALTA* 09/14/22   Georgina Quint, MD  HYDROcodone-acetaminophen (NORCO/VICODIN) 5-325 MG tablet Take 1 tablet by mouth every 6 (six) hours as needed for moderate pain (pain score 4-6). Do not take with Oxycodone 09/24/23   Magnant, Charles L, PA-C  ibuprofen (ADVIL) 800 MG tablet Take 1 tablet (800 mg total) by mouth every 8 (eight) hours as needed. 08/25/23   Magnant, Charles L, PA-C  insulin aspart (NOVOLOG FLEXPEN) 100 UNIT/ML FlexPen INYECTAR 3 UNIDADES POR VIA SUBCUTANEA TRES VECES AL DIA COMO INDICADO. AJUSTAR CANTIDAD DE INSULINA POR ESCALA MOVIL. MAS DOSIS 30 UNIDADES 08/02/23    Georgina Quint, MD  Insulin Pen Needle (B-D ULTRAFINE III SHORT PEN) 31G X 8 MM MISC USAR PARA INYECTAR INSULINA 10/13/21   Sagardia, Eilleen Kempf, MD  Insulin Pen Needle (B-D ULTRAFINE III SHORT PEN) 31G X 8 MM MISC USAR PARA INYECTAR INSULINA 10/13/21   Georgina Quint, MD  meclizine (ANTIVERT) 25 MG tablet Take 1 tablet (25 mg total) by mouth 3 (three) times daily as needed for dizziness. 09/30/23   Margarita Grizzle, MD  methocarbamol (ROBAXIN) 500 MG tablet Take 1 tablet (500 mg total) by mouth 2 (two) times daily. Patient not taking: Reported on 08/03/2023 04/14/23   Lorre Nick, MD  mupirocin ointment (BACTROBAN) 2 % Apply 1 Application topically daily. 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  Follow discharge instructions on paperwork for duration 08/09/23   Magnant, Charles L, PA-C  nicotine (NICODERM CQ) 14 mg/24hr patch Place 1 patch (14 mg total) onto the skin daily. Patient not taking: Reported on 08/03/2023 08/02/23   Georgina Quint, MD  [facility] ULTRA test strip USE TO CHECK BLOOD SUGAR AS INSTRUCTED 01/02/23   Georgina Quint, MD  pantoprazole (PROTONIX) 40 MG tablet TOMAR 1 TABLETA POR VIA ORAL UNA VEZ AL DIA 01/09/23   Georgina Quint, MD  sildenafil (VIAGRA) 100 MG tablet Take 0.5-1 tablets (50-100 mg total) by mouth daily as needed for erectile dysfunction. Patient taking differently: Take 50-100 mg by mouth daily as needed for erectile dysfunction. 12/29/22   Georgina Quint, MD  TRESIBA FLEXTOUCH 100 UNIT/ML FlexTouch Pen INYECTAR 25 UNIDADES POR VIA SUBCUTANEA DIARIAMENTE Patient taking differently: Inject 30 Units into the skin daily. 04/10/23   Georgina Quint, MD  Ubrogepant (UBRELVY) 100 MG TABS TOMAR 1 TALBETA POR VIA ORAL UNA VEZ AL DIA Patient not taking: Reported on 10/03/2023 07/11/22   Georgina Quint, MD      Allergies    Patient has no known allergies.    Review of Systems   Review of Systems  Respiratory:   Positive for choking and shortness of breath.   Cardiovascular:  Positive for chest pain.  All other systems reviewed and are negative.   Physical Exam Updated Vital Signs BP 116/77 (BP Location: Left Arm)   Pulse (!) 123   Temp 100 F (37.8 C) (Oral)   Resp 20   SpO2 94%  Physical Exam Vitals and nursing note reviewed.  Constitutional:      Appearance: He is well-developed.  HENT:     Head: Normocephalic and atraumatic.  Eyes:     Extraocular Movements: Extraocular movements intact.     Pupils: Pupils are equal, round, and reactive to light.  Cardiovascular:     Rate and Rhythm: Regular rhythm. Tachycardia present.  Pulmonary:     Effort: Tachypnea present.  Breath sounds: Wheezing present.  Abdominal:     General: Bowel sounds are normal.     Palpations: Abdomen is soft.  Musculoskeletal:        General: Normal range of motion.     Cervical back: Normal range of motion and neck supple.  Skin:    General: Skin is warm.     Capillary Refill: Capillary refill takes less than 2 seconds.  Neurological:     General: No focal deficit present.     Mental Status: He is alert and oriented to person, place, and time.  Psychiatric:        Mood and Affect: Mood normal.        Behavior: Behavior normal.     ED Results / Procedures / Treatments   Labs (all labs ordered are listed, but only abnormal results are displayed) Labs Reviewed  BASIC METABOLIC PANEL WITH GFR - Abnormal; Notable for the following components:      Result Value   Sodium 133 (*)    Glucose, Bld 113 (*)    Calcium 8.6 (*)    All other components within normal limits  BRAIN NATRIURETIC PEPTIDE - Abnormal; Notable for the following components:   B Natriuretic Peptide 141.1 (*)    All other components within normal limits  CBC WITH DIFFERENTIAL/PLATELET - Abnormal; Notable for the following components:   Neutro Abs 8.9 (*)    Lymphs Abs 0.4 (*)    All other components within normal limits  RESP  PANEL BY RT-PCR (RSV, FLU A&B, COVID)  [id number]  CULTURE, BLOOD (ROUTINE X 2)  CULTURE, BLOOD (ROUTINE X 2)  I-STAT CG4 LACTIC ACID, ED  I-STAT CG4 LACTIC ACID, ED  TROPONIN I (HIGH SENSITIVITY)  TROPONIN I (HIGH SENSITIVITY)    EKG EKG Interpretation Date/Time:  Saturday October 07 2023 10:57:26 EDT Ventricular Rate:  125 PR Interval:  132 QRS Duration:  90 QT Interval:  310 QTC Calculation: 447 R Axis:   235  Text Interpretation: Sinus tachycardia Probable left atrial enlargement Consider right ventricular hypertrophy Since last tracing rate faster Confirmed by Jacalyn Lefevre [phone]) on 10/07/2023 11:01:46 AM  Radiology DG Chest Portable 1 View Result Date: 10/07/2023 CLINICAL DATA:  Shortness of breath for 3 days. EXAM: PORTABLE CHEST 1 VIEW COMPARISON:  03/18/2022 FINDINGS: New streaky and patchy opacity at the left base may reflect atelectasis although pneumonia is not excluded. Right lung clear. The cardiopericardial silhouette is within normal limits for size. Status post bilateral shoulder replacement. IMPRESSION: New streaky and patchy opacity at the left base may reflect atelectasis although pneumonia is not excluded. Electronically Signed   By: Kennith Center M.D.   On: 10/07/2023 11:04    Procedures Procedures    Medications Ordered in ED Medications  azithromycin (ZITHROMAX) 500 mg in sodium chloride 0.9 % 250 mL IVPB (500 mg Intravenous New Bag/Given 10/07/23 1135)  albuterol (PROVENTIL) (2.5 MG/3ML) 0.083% nebulizer solution 5 mg (has no administration in time range)  morphine (PF) 4 MG/ML injection 4 mg (has no administration in time range)  ondansetron (ZOFRAN) injection 4 mg (has no administration in time range)  acetaminophen (TYLENOL) tablet 1,000 mg (1,000 mg Oral Given 10/07/23 1034)  sodium chloride 0.9 % bolus 500 mL (500 mLs Intravenous New Bag/Given 10/07/23 1114)  methylPREDNISolone sodium succinate (SOLU-MEDROL) 125 mg/2 mL injection 125 mg (125 mg Intravenous  Given 10/07/23 1115)  ipratropium-albuterol (DUONEB) 0.5-2.5 (3) MG/3ML nebulizer solution 3 mL (3 mLs Nebulization Given 10/07/23 1040)  ketorolac (TORADOL)  30 MG/ML injection 30 mg (30 mg Intravenous Given 10/07/23 1115)  albuterol (PROVENTIL) (2.5 MG/3ML) 0.083% nebulizer solution 5 mg (5 mg Nebulization Given 10/07/23 1040)  cefTRIAXone (ROCEPHIN) 1 g in sodium chloride 0.9 % 100 mL IVPB (1 g Intravenous New Bag/Given 10/07/23 1134)    ED Course/ Medical Decision Making/ A&P                                 Medical Decision Making Amount and/or Complexity of Data Reviewed Labs: ordered. Radiology: ordered.  Risk OTC drugs. Prescription drug management. Decision regarding hospitalization.   This patient presents to the ED for concern of sob, this involves an extensive number of treatment options, and is a complaint that carries with it a high risk of complications and morbidity.  The differential diagnosis includes covid/flu/rsv, pna, bronchitis, cardiac   Co morbidities that complicate the patient evaluation  COPD (on 2L), DM2, CAD, HTN, HLD, GERD, tobacco abuse and vertigo   Additional history obtained:  Additional history obtained from epic chart review External records from outside source obtained and reviewed including son   Lab Tests:  I Ordered, and personally interpreted labs.  The pertinent results include:  cbc nl, trop nl, bnp 141, bmp nl, covid/flu/rsv neg, lactic 2.0   Imaging Studies ordered:  I ordered imaging studies including cxr  I independently visualized and interpreted imaging which showed  New streaky and patchy opacity at the left base may reflect  atelectasis although pneumonia is not excluded.   I agree with the radiologist interpretation   Cardiac Monitoring:  The patient was maintained on a cardiac monitor.  I personally viewed and interpreted the cardiac monitored which showed an underlying rhythm of: st   Medicines ordered and prescription  drug management:  I ordered medication including solumedrol/nebs and rocephin/zithromax  for sx  Reevaluation of the patient after these medicines showed that the patient improved I have reviewed the patients home medicines and have made adjustments as needed   Test Considered:  ct   Critical Interventions:  Oxygen/abx/nebs   Consultations Obtained:  I requested consultation with the hospitalist (Dr. Robb Matar),  and discussed lab and imaging findings as well as pertinent plan - he will admit   Problem List / ED Course:  COPD exacerbation:  pt given several nebs and solumedrol.  Breathing has improved, but he is still wheezing and sob.  Additional nebs ordered. CAP:  pt is febrile with productive cough.  ? Pna on cxr, so pt given rocephin/zithromax Hypoxia:  O2 sat improved on his usual 2L.  Pt is out of his home oxygen. He said he has to go to a company out of town to get his oxygen. Maybe SW can work on getting a [employer] company to bring him his oxygen.    Reevaluation:  After the interventions noted above, I reevaluated the patient and found that they have :improved   Social Determinants of Health:  Lives at home.  Speaks Spanish.   Dispostion:  After consideration of the diagnostic results and the patients response to treatment, I feel that the patent would benefit from admission.    CRITICAL CARE Performed by: Jacalyn Lefevre   Total critical care time: 30 minutes  Critical care time was exclusive of separately billable procedures and treating other patients.  Critical care was necessary to treat or prevent imminent or life-threatening deterioration.  Critical care was time spent personally by me  on the following activities: development of treatment plan with patient and/or surrogate as well as nursing, discussions with consultants, evaluation of patient's response to treatment, examination of patient, obtaining history from patient or surrogate, ordering and  performing treatments and interventions, ordering and review of laboratory studies, ordering and review of radiographic studies, pulse oximetry and re-evaluation of patient's condition.       Final Clinical Impression(s) / ED Diagnoses Final diagnoses:  COPD exacerbation (HCC)  Community acquired pneumonia of left lower lobe of lung  Chest pain, unspecified type    Rx / DC Orders ED Discharge Orders     None         Jacalyn Lefevre, MD 10/07/23 1231    Jacalyn Lefevre, MD 10/07/23 1249

## 2023-10-07 NOTE — ED Triage Notes (Signed)
 Pt arrived via POV. C/o cough, and SOB for 3x days. Hx COPD on 2L [place] at home, but states they ran out today.  AOx4

## 2023-10-07 NOTE — H&P (Signed)
 History and Physical    Patient: Duane Cuevas GEX:[id number] DOB: 1956-03-17 DOA: 10/07/2023 DOS: the patient was seen and examined on 10/07/2023 PCP: Georgina Quint, MD  Patient coming from: Home  Chief Complaint:  Chief Complaint  Patient presents with   Cough   Shortness of Breath   HPI: Duane Cuevas is a 68 y.o. 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  No sick contacts or travel history. He has had low-grade fevers at home, chills, rhinorrhea, sore throat, wheezing or hemoptysis.  No chest pain, palpitations, diaphoresis, PND, orthopnea or pitting edema of the lower extremities.  No abdominal pain, nausea, emesis, diarrhea, constipation, melena or hematochezia.  No flank pain, dysuria, frequency or hematuria.  No polyuria, polydipsia, polyphagia or blurred vision.   Lab work: CBC showed a white count of 10.0 with 89% neutrophils, hemoglobin 15.3 g/dL platelets 010.  Magnesium was 1.6 and phosphorus 2.4 mg/dL.  Coronavirus, influenza and RSV PCR was negative.  Troponin was 4 ng/L x 2 and BNP 841.1 pg/mL.  Normal lactic acid.  BMP showed a sodium 133 mmol/L, glucose of 113 and calcium 8.6 mg deciliter.  The rest of the electrolytes and renal function were normal.  Imaging: Portable 1 view chest radiograph showing new streaky and patchy opacity at the left base that may reflect atelectasis, although pneumonia is not excluded.  ED course: Initial vital signs were temperature 100 F, pulse 127, respiration 16, BP 134/78 mmHg O2 sat 89% on room air.  The  patient received acetaminophen 1000 mg p.o. x 1, albuterol 5 mg neb x 2, DuoNeb, ceftriaxone 1 g IVPB, azithromycin 1 g IVPB, normal saline 500 mL bolus, methylprednisolone 125 mg IVP, morphine 4 mg IVP and ondansetron 4 mg IVP.  I added magnesium sulfate 2 g IVPB and K-Lor 40 mEq p.o. x 1.   Review of Systems: As mentioned in the history of present illness. All other systems reviewed and are negative. Past Medical History:  Diagnosis Date   Cerebral aneurysm, nonruptured    followed by dr n. 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   Coronary artery disease    (pt admitted for chest pain ) nuclear stress test 11-02-2019  low risk, nuclear ef 58%,  prior inferior defect of severe severity indicative of previous MI;;   echo 04-28-2020 ef 60-65%, mild concentric LVH, RSVP 54.19mmHg;;   cardiac CTA 09-15-2020  unremarkable,  calcium score=zero   Dependence on nocturnal oxygen therapy    02-27-2023  per pt daughter,  pt only uses at night w/ O2 sat ,2-3L via [place],  only uses portable if going outside when every hot which is not much   Diverticulosis of colon    DOE (dyspnea on exertion)    02-27-2023  per pt daugter sob w/ stairs, incline's,  ok w/ some yard work if not warm/ hot wearther,  can do house hold chores   ED (erectile dysfunction)    Emphysema/COPD (HCC)    followed by pcp;   uses breztri bid, uses preventil nebulizer every afternoon;   last exacerbation 06/ 2023 in epic   Full dentures  GERD (gastroesophageal reflux disease)    Hepatic steatosis    History of adenomatous polyp of colon    History of gastric ulcer    per EGD 09-16-2020  non-bleeding   History of MI (myocardial infarction)    per nuclear stress test in epic 11-02-2019 showed prior inferior defect of severe severity indicative of previous MI   History of pelvic fracture    1999  and 2009   Hyperlipidemia    Hypertension    Smokers'  cough (HCC)    Type 2 diabetes mellitus treated with insulin (HCC)    followed by pcp;  dx 2016   (02-27-2023  per pt daughter pt   Wears glasses    Past Surgical History:  Procedure Laterality Date   BICEPT TENODESIS Right 08/08/2023   Procedure: BICEPS TENODESIS;  Surgeon: Cammy Copa, MD;  Location: [facility] OR;  Service: Orthopedics;  Laterality: Right;   BIOPSY  09/16/2020   Procedure: BIOPSY;  Surgeon: Tressia Danas, MD;  Location: [facility];  Service: Gastroenterology;;   CHOLECYSTECTOMY N/A 03/20/2022   Procedure: LAPAROSCOPIC CHOLECYSTECTOMY;  Surgeon: Darnell Level, MD;  Location: [facility] ORS;  Service: General;  Laterality: N/A;   COLONOSCOPY WITH PROPOFOL  05/10/2018   dr Adela Lank   ESOPHAGOGASTRODUODENOSCOPY N/A 09/23/2015   Procedure: ESOPHAGOGASTRODUODENOSCOPY (EGD);  Surgeon: Meryl Dare, MD;  Location: [facility];  Service: Endoscopy;  Laterality: N/A;   ESOPHAGOGASTRODUODENOSCOPY (EGD) WITH PROPOFOL N/A 09/16/2020   Procedure: ESOPHAGOGASTRODUODENOSCOPY (EGD) WITH PROPOFOL;  Surgeon: Tressia Danas, MD;  Location: [facility];  Service: Gastroenterology;  Laterality: N/A;   EXCISION MASS NECK Left 03/09/2023   Procedure: EXCISION SOFT TISSUE MASS LEFT POSTERIOR NECK;  Surgeon: Darnell Level, MD;  Location: [facility];  Service: General;  Laterality: Left;   FOOT SURGERY Left 1999   INGUINAL HERNIA REPAIR Left 05/15/2018   Procedure: LEFT INGUINAL HERNIA REPAIR WITH MESH;  Surgeon: Harriette Bouillon, MD;  Location: [facility] OR;  Service: General;  Laterality: Left;   INSERTION OF MESH Left 05/15/2018   Procedure: INSERTION OF MESH;  Surgeon: Harriette Bouillon, MD;  Location: [facility] OR;  Service: General;  Laterality: Left;   INTRAOPERATIVE CHOLANGIOGRAM N/A 03/20/2022   Procedure: INTRAOPERATIVE CHOLANGIOGRAM;  Surgeon: Darnell Level, MD;  Location: [facility];  Service: General;  Laterality: N/A;   REVERSE SHOULDER ARTHROPLASTY Left 08/30/2022   Procedure: LEFT REVERSE SHOULDER  ARTHROPLASTY;  Surgeon: Cammy Copa, MD;  Location: [facility] OR;  Service: Orthopedics;  Laterality: Left;   REVERSE SHOULDER ARTHROPLASTY Right 08/08/2023   Procedure: RIGHT REVERSE SHOULDER ARTHROPLASTY;  Surgeon: Cammy Copa, MD;  Location: [facility] OR;  Service: Orthopedics;  Laterality: Right;   SHOULDER ARTHROSCOPY W/ ROTATOR CUFF REPAIR Left    yrs ago   Social History:  reports that he has been smoking cigarettes. He started smoking about 54 years ago. He has a 8.1 pack-year smoking history. He has never used smokeless tobacco. He reports that he does not currently use alcohol after a past usage of about 2.0 standard drinks of alcohol per week. He reports that he does not use drugs.  No Known Allergies  Family History  Problem Relation Age of Onset   Emphysema Mother    Diabetes Mother    Emphysema Father    Cancer Sister        Stomach cancer   Diabetes Sister    Stomach cancer Sister    Diabetes Brother    Colon cancer Neg Hx    Colon polyps Neg Hx  Esophageal cancer Neg Hx    Rectal cancer Neg Hx     Prior to Admission medications   Medication Sig Start Date End Date Taking? Authorizing Provider  albuterol (PROVENTIL) (2.5 MG/3ML) 0.083% nebulizer solution INHALAR 1 FRASCO VIA NEBULIZADOR CADA 6 HORAS SEGUN SEA NECESARIO PARA SIBILANCIAS O FALTA DE AIRE 12/07/22   Georgina Quint, MD  albuterol (VENTOLIN HFA) 108 (90 Base) MCG/ACT inhaler INHALAR 2 BOCANADAS POR VIA ORAL CADA 6 HORAS SEGUN SEA NECESARIO PARA SIBILANCIAS O FALTA DE AIRE 10/07/22   Sagardia, Eilleen Kempf, MD  amLODipine (NORVASC) 5 MG tablet TOMAR 1 TABLETA POR VAI ORAL UNA VEZ AL DIA 12/07/22   Georgina Quint, MD  amoxicillin-clavulanate (AUGMENTIN) 875-125 MG tablet Take 1 tablet by mouth every 12 (twelve) hours. 09/30/23   Margarita Grizzle, MD  aspirin EC 81 MG tablet Take 1 tablet (81 mg total) by mouth daily. Swallow whole. 09/01/22   Magnant, Charles L, PA-C  atorvastatin (LIPITOR) 20 MG tablet  TOMAR 1 TABLETA POR VIA ORAL UNA VEZ AL DIA 10/07/22   Georgina Quint, MD  blood glucose meter kit and supplies KIT Dispense based on patient and insurance preference. Use up to four times daily as directed. 10/22/20   Georgina Quint, MD  blood glucose meter kit and supplies Dispense based on patient and insurance preference. Use up to four times daily as directed. (FOR ICD-10 E10.9, E11.9). 10/24/20   Georgina Quint, MD  Blood Glucose Monitoring Suppl (ONE TOUCH ULTRA 2) w/Device KIT Use as directed to check blood glucose up to 2 times a day 12/29/22   Georgina Quint, MD  Budeson-Glycopyrrol-Formoterol (BREZTRI AEROSPHERE) 160-9-4.8 MCG/ACT AERO INHALAR 2 BOCANADAS POR VIA ORAL DOS VECES AL DIA 06/16/23   Georgina Quint, MD  celecoxib (CELEBREX) 100 MG capsule Take 1 capsule (100 mg total) by mouth 2 (two) times daily. 08/09/23   Magnant, Charles L, PA-C  cyclobenzaprine (FLEXERIL) 10 MG tablet Take 1 tablet (10 mg total) by mouth 2 (two) times daily as needed for muscle spasms. 09/27/23   Fayrene Helper, PA-C  docusate sodium (COLACE) 100 MG capsule Take 1 capsule (100 mg total) by mouth 2 (two) times daily. 08/09/23   Magnant, Joycie Peek, PA-C  Dulaglutide (TRULICITY) 1.5 MG/0.5ML Sula Soda CONTENIDOS DE UN LAPIZ POR VIA SUBCUTANEA CADA [place], EL MISMO DIA CADA [facility] 07/18/23   Georgina Quint, MD  FARXIGA 10 MG TABS tablet TOMAR 1 TABLETA POR VIA ORAL UNA VEZ AL DIA 02/06/23   Georgina Quint, MD  gabapentin (NEURONTIN) 300 MG capsule Take 1 capsule (300 mg total) by mouth at bedtime as needed. 09/24/23   Magnant, Charles L, PA-C  glipiZIDE (GLUCOTROL) 10 MG tablet TOMAR 1 TABLETA POR VIA ORAL UNA VEZ AL DIA ANTES DEL DESAYUNO *TOMAR 1 TABLETA ADICIONAL SI AZUCAR EN LA SANGRE ESTA ALTA* 09/14/22   Sagardia, Eilleen Kempf, MD  HYDROcodone-acetaminophen (NORCO/VICODIN) 5-325 MG tablet Take 1 tablet by mouth every 6 (six) hours as needed for moderate pain (pain score  4-6). Do not take with Oxycodone 09/24/23   Magnant, Charles L, PA-C  ibuprofen (ADVIL) 800 MG tablet Take 1 tablet (800 mg total) by mouth every 8 (eight) hours as needed. 08/25/23   Magnant, Charles L, PA-C  insulin aspart (NOVOLOG FLEXPEN) 100 UNIT/ML FlexPen INYECTAR 3 UNIDADES POR VIA SUBCUTANEA TRES VECES AL DIA COMO INDICADO. AJUSTAR CANTIDAD DE INSULINA POR ESCALA MOVIL. MAS DOSIS 30 UNIDADES 08/02/23   Georgina Quint, MD  Insulin Pen Needle (B-D ULTRAFINE III SHORT PEN) 31G X 8 MM MISC USAR PARA INYECTAR INSULINA 10/13/21   Sagardia, Eilleen Kempf, MD  Insulin Pen Needle (B-D ULTRAFINE III SHORT PEN) 31G X 8 MM MISC USAR PARA INYECTAR INSULINA 10/13/21   Georgina Quint, MD  meclizine (ANTIVERT) 25 MG tablet Take 1 tablet (25 mg total) by mouth 3 (three) times daily as needed for dizziness. 09/30/23   Margarita Grizzle, MD  methocarbamol (ROBAXIN) 500 MG tablet Take 1 tablet (500 mg total) by mouth 2 (two) times daily. Patient not taking: Reported on 08/03/2023 04/14/23   Lorre Nick, MD  mupirocin ointment (BACTROBAN) 2 % Apply 1 Application topically daily. 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  Follow discharge instructions on paperwork for duration 08/09/23   Magnant, Charles L, PA-C  nicotine (NICODERM CQ) 14 mg/24hr patch Place 1 patch (14 mg total) onto the skin daily. Patient not taking: Reported on 08/03/2023 08/02/23   Georgina Quint, MD  [facility] ULTRA test strip USE TO CHECK BLOOD SUGAR AS INSTRUCTED 01/02/23   Georgina Quint, MD  pantoprazole (PROTONIX) 40 MG tablet TOMAR 1 TABLETA POR VIA ORAL UNA VEZ AL DIA 01/09/23   Georgina Quint, MD  sildenafil (VIAGRA) 100 MG tablet Take 0.5-1 tablets (50-100 mg total) by mouth daily as needed for erectile dysfunction. Patient taking differently: Take 50-100 mg by mouth daily as needed for erectile dysfunction. 12/29/22   Georgina Quint, MD  TRESIBA FLEXTOUCH 100 UNIT/ML FlexTouch Pen INYECTAR 25  UNIDADES POR VIA SUBCUTANEA DIARIAMENTE Patient taking differently: Inject 30 Units into the skin daily. 04/10/23   Georgina Quint, MD  Ubrogepant (UBRELVY) 100 MG TABS TOMAR 1 TALBETA POR VIA ORAL UNA VEZ AL DIA Patient not taking: Reported on 10/03/2023 07/11/22   Georgina Quint, MD    Physical Exam: Vitals:   10/07/23 0938 10/07/23 1130  BP: 134/78 116/77  Pulse: (!) 127 (!) 123  Resp: 16 20  Temp: 100 F (37.8 C)   TempSrc: Oral   SpO2: (!) 89% 94%   Physical Exam Vitals and nursing note reviewed.  Constitutional:      General: He is awake. He is not in acute distress.    Appearance: He is well-developed. He is ill-appearing.  HENT:     Head: Normocephalic.     Nose: No rhinorrhea.     Mouth/Throat:     Mouth: Mucous membranes are moist.  Eyes:     General: No scleral icterus.    Pupils: Pupils are equal, round, and reactive to light.  Neck:     Vascular: No JVD.  Cardiovascular:     Rate and Rhythm: Normal rate and regular rhythm.     Heart sounds: S1 normal and S2 normal.  Pulmonary:     Effort: Pulmonary effort is normal. No tachypnea, accessory muscle usage or respiratory distress.     Breath sounds: Examination of the left-lower field reveals decreased breath sounds and rales. Decreased breath sounds, wheezing and rales present. No rhonchi.  Abdominal:     General: There is no distension.     Palpations: Abdomen is soft.     Tenderness: There is no abdominal tenderness. There is no right CVA tenderness or left CVA tenderness.  Musculoskeletal:     Cervical back: Neck supple.     Right lower leg: No edema.     Left lower leg: No edema.  Skin:    General: Skin is warm  and dry.  Neurological:     General: No focal deficit present.     Mental Status: He is alert and oriented to person, place, and time.  Psychiatric:        Mood and Affect: Mood normal.        Behavior: Behavior normal. Behavior is cooperative.     Data Reviewed:  Results are  pending, will review when available.  EKG: Vent. rate 125 BPM PR interval 132 ms QRS duration 90 ms QT/QTcB 310/447 ms P-R-T axes 71 235 63 Sinus tachycardia Probable left atrial enlargement Consider right ventricular hypertrophy  Assessment and Plan: Principal Problem:   Acute on chronic respiratory failure with hypoxia (HCC) In the setting of:   COPD with acute exacerbation (HCC) Observation/telemetry Continue supplemental oxygen. Methylprednisolone 125 mg IVP x1. Followed by prednisone 40 mg p.o. daily in a.m. Scheduled and as needed bronchodilators. Follow-up CBC and chemistry in the morning.  There is also suspicion for pneumonia. Will continue IV antibiotics for now. Add on procalcitonin to earlier labs and recheck procalcitonin in AM.  Active Problems:   Uncontrolled type 2 diabetes mellitus with hyperglycemia (HCC) Carbohydrate modified diet. CBG monitoring with RI SS. Check hemoglobin A1c.    Dyslipidemia associated with type 2 diabetes mellitus (HCC) Continue atorvastatin 20 mg p.o. daily.    Essential hypertension  Continue amlodipine 5 mg p.o. daily.    CAD (coronary artery disease) Continue ASA, CCB and statin.    Vertigo Continue meclizine 25 mg p.o. 3 times daily.    Advance Care Planning:   Code Status: Full Code   Consults:   Family Communication:   Severity of Illness: The appropriate patient status for this patient is INPATIENT. Inpatient status is judged to be reasonable and necessary in order to provide the required intensity of service to ensure the patient's safety. The patient's presenting symptoms, physical exam findings, and initial radiographic and laboratory data in the context of their chronic comorbidities is felt to place them at high risk for further clinical deterioration. Furthermore, it is not anticipated that the patient will be medically stable for discharge from the hospital within 2 midnights of admission.   * I certify  that at the point of admission it is my clinical judgment that the patient will require inpatient hospital care spanning beyond 2 midnights from the point of admission due to high intensity of service, high risk for further deterioration and high frequency of surveillance required.*  Author: Bobette Mo, MD 10/07/2023 12:48 PM  For on call review [email or web address].   This document was prepared using Dragon voice recognition software and may contain some unintended transcription errors.

## 2023-10-08 DIAGNOSIS — J9621 Acute and chronic respiratory failure with hypoxia: Secondary | ICD-10-CM | POA: Diagnosis not present

## 2023-10-08 LAB — HIV ANTIBODY (ROUTINE TESTING W REFLEX): HIV Screen 4th Generation wRfx: NONREACTIVE

## 2023-10-08 LAB — PROCALCITONIN: Procalcitonin: 0.3 ng/mL

## 2023-10-08 LAB — CBC
HCT: 45.3 % (ref 39.0–52.0)
Hemoglobin: 14.8 g/dL (ref 13.0–17.0)
MCH: 29.7 pg (ref 26.0–34.0)
MCHC: 32.7 g/dL (ref 30.0–36.0)
MCV: 90.8 fL (ref 80.0–100.0)
Platelets: 233 10*3/uL (ref 150–400)
RBC: 4.99 MIL/uL (ref 4.22–5.81)
RDW: 13.6 % (ref 11.5–15.5)
WBC: 7.3 10*3/uL (ref 4.0–10.5)
nRBC: 0 % (ref 0.0–0.2)

## 2023-10-08 LAB — GLUCOSE, CAPILLARY
Glucose-Capillary: 158 mg/dL — ABNORMAL HIGH (ref 70–99)
Glucose-Capillary: 222 mg/dL — ABNORMAL HIGH (ref 70–99)
Glucose-Capillary: 262 mg/dL — ABNORMAL HIGH (ref 70–99)
Glucose-Capillary: 170 mg/dL — ABNORMAL HIGH (ref 70–99)

## 2023-10-08 LAB — MAGNESIUM: Magnesium: 2.4 mg/dL (ref 1.7–2.4)

## 2023-10-08 LAB — COMPREHENSIVE METABOLIC PANEL WITH GFR
ALT: 20 U/L (ref 0–44)
AST: 17 U/L (ref 15–41)
Albumin: 3.2 g/dL — ABNORMAL LOW (ref 3.5–5.0)
Alkaline Phosphatase: 72 U/L (ref 38–126)
Anion gap: 9 (ref 5–15)
BUN: 22 mg/dL (ref 8–23)
CO2: 22 mmol/L (ref 22–32)
Calcium: 8.6 mg/dL — ABNORMAL LOW (ref 8.9–10.3)
Chloride: 103 mmol/L (ref 98–111)
Creatinine, Ser: 0.73 mg/dL (ref 0.61–1.24)
GFR, Estimated: >60 mL/min (ref >60)
Glucose, Bld: 147 mg/dL — ABNORMAL HIGH (ref 70–99)
Potassium: 4.5 mmol/L (ref 3.5–5.1)
Sodium: 134 mmol/L — ABNORMAL LOW (ref 135–145)
Total Bilirubin: 0.5 mg/dL (ref 0.0–1.2)
Total Protein: 7 g/dL (ref 6.5–8.1)

## 2023-10-08 LAB — PHOSPHORUS: Phosphorus: 2.2 mg/dL — ABNORMAL LOW (ref 2.5–4.6)

## 2023-10-08 LAB — STREP PNEUMONIAE URINARY ANTIGEN: Strep Pneumo Urinary Antigen: NEGATIVE

## 2023-10-08 MED ORDER — METHYLPREDNISOLONE SODIUM SUCC 40 MG IJ SOLR
40.0000 mg | Freq: Two times a day (BID) | INTRAMUSCULAR | Status: DC
  Administered 2023-10-08 – 2023-10-09 (×3): 40 mg via INTRAVENOUS
  Filled 2023-10-08 (×4): qty 1

## 2023-10-08 MED ORDER — BUDESONIDE 0.5 MG/2ML IN SUSP
0.5000 mg | Freq: Two times a day (BID) | RESPIRATORY_TRACT | Status: DC
  Administered 2023-10-08 – 2023-10-10 (×5): 0.5 mg via RESPIRATORY_TRACT
  Filled 2023-10-08 (×5): qty 2

## 2023-10-08 MED ORDER — SODIUM CHLORIDE 0.9 % IV SOLN
2.0000 g | INTRAVENOUS | Status: DC
  Administered 2023-10-08 – 2023-10-10 (×3): 2 g via INTRAVENOUS
  Filled 2023-10-08 (×3): qty 20

## 2023-10-08 MED ORDER — IPRATROPIUM-ALBUTEROL 0.5-2.5 (3) MG/3ML IN SOLN: 3.0000 mL | Freq: Four times a day (QID) | RESPIRATORY_TRACT | Status: DC | PRN

## 2023-10-08 MED ORDER — K PHOS MONO-SOD PHOS DI & MONO 155-852-130 MG PO TABS
500.0000 mg | ORAL_TABLET | Freq: Three times a day (TID) | ORAL | Status: DC
  Administered 2023-10-08 – 2023-10-09 (×6): 500 mg via ORAL
  Filled 2023-10-08 (×8): qty 2

## 2023-10-08 MED ORDER — IPRATROPIUM-ALBUTEROL 0.5-2.5 (3) MG/3ML IN SOLN
3.0000 mL | Freq: Four times a day (QID) | RESPIRATORY_TRACT | Status: DC
  Administered 2023-10-08 – 2023-10-09 (×5): 3 mL via RESPIRATORY_TRACT
  Filled 2023-10-08 (×5): qty 3

## 2023-10-08 NOTE — Progress Notes (Signed)
 PROGRESS NOTE  Duane Cuevas  LKG:[id number] DOB: May 02, 1956 DOA: 10/07/2023 PCP: Georgina Quint, MD   Brief Narrative: Patient is a 68 year old male with history of diabetes type 2, COPD, chronic hypoxic respiratory failure on 2 L of oxygen at home, coronary artery disease, hyperlipidemia, hypertension, nonruptured cerebral aneurysm who presented with complaint of productive cough with yellow sputum, shortness of breath, left-sided chest pain, wheezing, sore throat, low-grade fever.  Lab work showed WC count of 10, magnesium of 1.6, phosphorus of 2.4, BNP of 841.  COVID/flu/influenza negative.  Chest x-ray showed new streaky and patchy opacity at the left base likely atelectasis but pneumonia not excluded.  Patient had low-grade fever on presentation, tachycardia, stable blood pressure.  Being managed for COPD exacerbation and started on antibiotics for possible community-acquired pneumonia.  Assessment & Plan:  Principal Problem:   Acute on chronic respiratory failure with hypoxia (HCC) Active Problems:   Uncontrolled type 2 diabetes mellitus with hyperglycemia (HCC)   Dyslipidemia associated with type 2 diabetes mellitus (HCC)   CAD (coronary artery disease)   Vertigo   COPD with acute exacerbation (HCC)   Essential hypertension   Acute COPD exacerbation: Presented with increased sputum production, cough, wheezing.  Continue bronchodilators, mucolytics,steroids  Community-acquired pneumonia: Chest x-ray showed possible patchy in the left base.  Pneumonia not excluded.  Had low-grade fever at home.  Started on azithromycin, ceftriaxone.  Follow-up blood cultures.  Procalcitonin reassuring  Hypomagnesemia/hypophosphatemia: Continue to monitor and supplement as needed.  Chronic hypoxic respiratory failure: On 2 L of oxygen at home.  Currently at the same  Diabetes type 2: Continue current insulin regimen.  Monitor blood sugars  Hyperlipidemia: On Lipitor  Hypertension: On  Amlodipine  Coronary artery disease: On aspirin, calcium channel blocker, statin  Vertigo: On meclizine  Hypophosphatemia: Continue to monitor and supplement  Smoker: Still smoking about 5-8 yesterday.  Counseled cessation          DVT prophylaxis:enoxaparin (LOVENOX) injection 40 mg Start: 10/07/23 2200     Code Status: Full Code  Family Communication: Called and discussed with daughter on phone on 4/6  Patient status:Inpatient  Patient is from :home   Anticipated discharge [id number]  Estimated DC date: 1 to 2 days   Consultants: None  Procedures: None  Antimicrobials:  Anti-infectives (From admission, onward)    Start     Dose/Rate Route Frequency Ordered Stop   10/08/23 1000  cefTRIAXone (ROCEPHIN) 1 g in sodium chloride 0.9 % 100 mL IVPB        1 g 200 mL/hr over 30 Minutes Intravenous Every 24 hours 10/07/23 1615 10/13/23 0959   10/08/23 1000  azithromycin (ZITHROMAX) 500 mg in sodium chloride 0.9 % 250 mL IVPB        500 mg 250 mL/hr over 60 Minutes Intravenous Every 24 hours 10/07/23 1615 10/12/23 0959   10/07/23 1115  cefTRIAXone (ROCEPHIN) 1 g in sodium chloride 0.9 % 100 mL IVPB        1 g 200 mL/hr over 30 Minutes Intravenous  Once 10/07/23 1108 10/07/23 1235   10/07/23 1115  azithromycin (ZITHROMAX) 500 mg in sodium chloride 0.9 % 250 mL IVPB        500 mg 250 mL/hr over 60 Minutes Intravenous  Once 10/07/23 1108 10/07/23 1306       Subjective: Patient seen and examined at bedside today.  Hemodynamically stable.  On 2 L of oxygen which is his baseline.  Speaking in full sentences.  Still has some cough.  Afebrile this morning.  Auscultated to have some mild wheezing bilaterally.  Appears comfortable.  Communicated with daughter on phone at bedside.  Objective: Vitals:   10/07/23 1511 10/07/23 1649 10/07/23 2043 10/08/23 0508  BP:   108/71 110/80  Pulse:   84 96  Resp:  20 20 18   Temp:   98.6 F (37 C) 98.1 F (36.7 C)  TempSrc:   Oral Oral   SpO2:   96% 95%  Weight: 74.4 kg     Height: 5\' 8"  (1.727 m)       Intake/Output Summary (Last 24 hours) at 10/08/2023 0746 Last data filed at 10/08/2023 0100 Gross per 24 hour  Intake 1137 ml  Output 225 ml  Net 912 ml   Filed Weights   10/07/23 1511  Weight: 74.4 kg    Examination:  General exam: Overall comfortable, not in distress HEENT: PERRL Respiratory system: Mildly  diminished sounds bilaterally, bilateral expiratory wheezing Cardiovascular system: S1 & S2 heard, RRR.  Gastrointestinal system: Abdomen is nondistended, soft and nontender. Central nervous system: Alert and oriented Extremities: No edema, no clubbing ,no cyanosis Skin: No rashes, no ulcers,no icterus     Data Reviewed: I have personally reviewed following labs and imaging studies  CBC: Recent Labs  Lab 10/07/23 1055 10/08/23 0510  WBC 10.0 7.3  NEUTROABS 8.9*  --   HGB 15.3 14.8  HCT 46.0 45.3  MCV 90.7 90.8  PLT 247 233   Basic Metabolic Panel: Recent Labs  Lab 10/07/23 1055 10/08/23 0510  NA 133* 134*  K 3.5 4.5  CL 100 103  CO2 24 22  GLUCOSE 113* 147*  BUN 13 22  CREATININE 0.78 0.73  CALCIUM 8.6* 8.6*  MG 1.6*  --   PHOS 2.4*  --      Recent Results (from the past 240 hours)  Culture, blood (routine x 2)     Status: None (Preliminary result)   Collection Time: 10/07/23 10:53 AM   Specimen: BLOOD RIGHT ARM  Result Value Ref Range Status   Specimen Description   Final    BLOOD RIGHT ARM Performed at [facility], [address] [place], [place] [address]    Special Requests   Final    BOTTLES DRAWN AEROBIC AND ANAEROBIC Blood Culture adequate volume Performed at [facility], [address], [place], [place] [address]    Culture   Final    NO GROWTH < 24 HOURS Performed at [facility], [address], [place], [place] [address]    Report Status PENDING  Incomplete  Culture, blood (routine x 2)     Status: None (Preliminary result)    Collection Time: 10/07/23 10:55 AM   Specimen: BLOOD LEFT ARM  Result Value Ref Range Status   Specimen Description   Final    BLOOD LEFT ARM Performed at [facility], [address], [place], [place] [address]    Special Requests   Final    BOTTLES DRAWN AEROBIC AND ANAEROBIC Blood Culture results may not be optimal due to an inadequate volume of blood received in culture bottles Performed at [facility], [address], [place], [place] [address]    Culture   Final    NO GROWTH < 24 HOURS Performed at [facility], [address], [place], [place] [address]    Report Status PENDING  Incomplete  Resp panel by RT-PCR (RSV, Flu A&B, Covid) Anterior Nasal Swab  Status: None   Collection Time: 10/07/23 11:36 AM   Specimen: Anterior Nasal Swab  Result Value Ref Range Status   SARS Coronavirus 2 by RT PCR NEGATIVE NEGATIVE Final    Comment: (NOTE) SARS-CoV-2 target nucleic acids are NOT DETECTED.  The SARS-CoV-2 RNA is generally detectable in upper respiratory specimens during the acute phase of infection. The lowest concentration of SARS-CoV-2 viral copies this assay can detect is 138 copies/mL. A negative result does not preclude SARS-Cov-2 infection and should not be used as the sole basis for treatment or other patient management decisions. A negative result may occur with  improper specimen collection/handling, submission of specimen other than nasopharyngeal swab, presence of viral mutation(s) within the areas targeted by this assay, and inadequate number of viral copies(<138 copies/mL). A negative result must be combined with clinical observations, patient history, and epidemiological information. The expected result is Negative.  Fact Sheet for Patients:  BloggerCourse.com  Fact Sheet for Healthcare Providers:  SeriousBroker.it  This test is no t yet approved or cleared by the [place] FDA and  has been authorized for detection and/or diagnosis of SARS-CoV-2 by FDA under an Emergency Use Authorization (EUA). This EUA will remain  in effect (meaning this test can be used) for the duration of the COVID-19 declaration under Section 564(b)(1) of the Act, 21 U.S.C.section 360bbb-3(b)(1), unless the authorization is terminated  or revoked sooner.       Influenza A by PCR NEGATIVE NEGATIVE Final   Influenza B by PCR NEGATIVE NEGATIVE Final    Comment: (NOTE) The Xpert Xpress SARS-CoV-2/FLU/RSV plus assay is intended as an aid in the diagnosis of influenza from Nasopharyngeal swab specimens and should not be used as a sole basis for treatment. Nasal washings and aspirates are unacceptable for Xpert Xpress SARS-CoV-2/FLU/RSV testing.  Fact Sheet for Patients: BloggerCourse.com  Fact Sheet for Healthcare Providers: SeriousBroker.it  This test is not yet approved or cleared by the [place] FDA and has been authorized for detection and/or diagnosis of SARS-CoV-2 by FDA under an Emergency Use Authorization (EUA). This EUA will remain in effect (meaning this test can be used) for the duration of the COVID-19 declaration under Section 564(b)(1) of the Act, 21 U.S.C. section 360bbb-3(b)(1), unless the authorization is terminated or revoked.     Resp Syncytial Virus by PCR NEGATIVE NEGATIVE Final    Comment: (NOTE) Fact Sheet for Patients: BloggerCourse.com  Fact Sheet for Healthcare Providers: SeriousBroker.it  This test is not yet approved or cleared by the [place] FDA and has been authorized for detection and/or diagnosis of SARS-CoV-2 by FDA under an Emergency Use Authorization (EUA). This EUA will remain in effect (meaning this test can be used) for the duration of the COVID-19 declaration under Section 564(b)(1) of the Act, 21 U.S.C. section  360bbb-3(b)(1), unless the authorization is terminated or revoked.  Performed at [facility], [address], [place], [place] [address]   Expectorated Sputum Assessment w Gram Stain, Rflx to Resp Cult     Status: None   Collection Time: 10/07/23 10:35 PM   Specimen: Sputum  Result Value Ref Range Status   Specimen Description SPUTUM  Final   Special Requests NONE  Final   Sputum evaluation   Final    THIS SPECIMEN IS ACCEPTABLE FOR SPUTUM CULTURE Performed at [facility], [address], [place], [place] [address]    Report Status 10/07/2023 FINAL  Final  Culture, Respiratory w Gram Stain  Status: None (Preliminary result)   Collection Time: 10/07/23 10:35 PM   Specimen: SPU  Result Value Ref Range Status   Specimen Description   Final    SPUTUM Performed at [facility], [address] [place], [place] [address]    Special Requests   Final    NONE Reflexed from [id number] Performed at [facility], [address] [place], [place] [address]    Gram Stain   Final    RARE WBC PRESENT, PREDOMINANTLY PMN RARE GRAM POSITIVE COCCI Performed at [facility], [address], [place], [place] [address]    Culture PENDING  Incomplete   Report Status PENDING  Incomplete     Radiology Studies: DG Chest Portable 1 View Result Date: 10/07/2023 CLINICAL DATA:  Shortness of breath for 3 days. EXAM: PORTABLE CHEST 1 VIEW COMPARISON:  03/18/2022 FINDINGS: New streaky and patchy opacity at the left base may reflect atelectasis although pneumonia is not excluded. Right lung clear. The cardiopericardial silhouette is within normal limits for size. Status post bilateral shoulder replacement. IMPRESSION: New streaky and patchy opacity at the left base may reflect atelectasis although pneumonia is not excluded. Electronically Signed   By: Kennith Center M.D.   On: 10/07/2023 11:04    Scheduled Meds:  amLODipine  5  mg Oral Daily   aspirin EC  81 mg Oral Daily   atorvastatin  20 mg Oral Daily   dapagliflozin propanediol  10 mg Oral Daily   enoxaparin (LOVENOX) injection  40 mg Subcutaneous Q24H   glipiZIDE  10 mg Oral QAC breakfast   insulin aspart  0-15 Units Subcutaneous TID AC & HS   insulin glargine-yfgn  30 Units Subcutaneous Daily   pantoprazole  40 mg Oral Daily   predniSONE  40 mg Oral Q breakfast   Continuous Infusions:  azithromycin     cefTRIAXone (ROCEPHIN)  IV       LOS: 1 day   Burnadette Pop, MD [facility] P4/12/2023, 7:46 AM

## 2023-10-08 NOTE — Progress Notes (Signed)
 Mobility Specialist - Progress Note  (RA) Pre-mobility: 97% HR, 94% SpO2 During mobility: 85% SpO2 Post-mobility: 84 bpm HR, 90% SPO2   10/08/23 1319  Mobility  Activity Ambulated independently in hallway  Level of Assistance Contact guard assist, steadying assist  Assistive Device None  Distance Ambulated (ft) 300 ft  Range of Motion/Exercises Active  Activity Response Tolerated fair  Mobility Referral Yes  Mobility visit 1 Mobility  Mobility Specialist Start Time (ACUTE ONLY) 1307  Mobility Specialist Stop Time (ACUTE ONLY) 1319  Mobility Specialist Time Calculation (min) (ACUTE ONLY) 12 min   Pt was found in bed and agreeable to ambulate. Had x1 failed STS due to vertigo. Had x1 brief standing rest break due to SPO2 85%. Able to increase within 30 seconds. At EOS returned to bed with all needs met. Call bell in reach and RN notified.  Billey Chang Mobility Specialist

## 2023-10-09 DIAGNOSIS — J9621 Acute and chronic respiratory failure with hypoxia: Secondary | ICD-10-CM | POA: Diagnosis not present

## 2023-10-09 LAB — GLUCOSE, CAPILLARY
Glucose-Capillary: 166 mg/dL — ABNORMAL HIGH (ref 70–99)
Glucose-Capillary: 278 mg/dL — ABNORMAL HIGH (ref 70–99)
Glucose-Capillary: 201 mg/dL — ABNORMAL HIGH (ref 70–99)
Glucose-Capillary: 279 mg/dL — ABNORMAL HIGH (ref 70–99)

## 2023-10-09 LAB — CG4 I-STAT (LACTIC ACID): Lactic Acid, Venous: 2 mmol/L (ref 0.5–1.9)

## 2023-10-09 LAB — PHOSPHORUS: Phosphorus: 3.3 mg/dL (ref 2.5–4.6)

## 2023-10-09 MED ORDER — SENNOSIDES-DOCUSATE SODIUM 8.6-50 MG PO TABS
1.0000 | ORAL_TABLET | Freq: Two times a day (BID) | ORAL | Status: DC
  Administered 2023-10-09 – 2023-10-10 (×3): 1 via ORAL
  Filled 2023-10-09 (×3): qty 1

## 2023-10-09 MED ORDER — INSULIN ASPART 100 UNIT/ML IJ SOLN
3.0000 [IU] | Freq: Three times a day (TID) | INTRAMUSCULAR | Status: DC
  Administered 2023-10-09 – 2023-10-10 (×4): 3 [IU] via SUBCUTANEOUS

## 2023-10-09 MED ORDER — POLYETHYLENE GLYCOL 3350 17 G PO PACK
17.0000 g | PACK | Freq: Every day | ORAL | Status: DC
  Administered 2023-10-09 – 2023-10-10 (×2): 17 g via ORAL
  Filled 2023-10-09 (×2): qty 1

## 2023-10-09 MED ORDER — BISACODYL 10 MG RE SUPP
10.0000 mg | Freq: Once | RECTAL | Status: AC
  Administered 2023-10-09: 10 mg via RECTAL
  Filled 2023-10-09: qty 1

## 2023-10-09 MED ORDER — REVEFENACIN 175 MCG/3ML IN SOLN
175.0000 ug | Freq: Every day | RESPIRATORY_TRACT | Status: DC
  Administered 2023-10-09 – 2023-10-10 (×2): 175 ug via RESPIRATORY_TRACT
  Filled 2023-10-09 (×2): qty 3

## 2023-10-09 MED ORDER — ARFORMOTEROL TARTRATE 15 MCG/2ML IN NEBU
15.0000 ug | INHALATION_SOLUTION | Freq: Two times a day (BID) | RESPIRATORY_TRACT | Status: DC
  Administered 2023-10-09 – 2023-10-10 (×3): 15 ug via RESPIRATORY_TRACT
  Filled 2023-10-09 (×3): qty 2

## 2023-10-09 NOTE — Inpatient Diabetes Management (Signed)
 Inpatient Diabetes Program Recommendations  AACE/ADA: New Consensus Statement on Inpatient Glycemic Control (2015)  Target Ranges:  Prepandial:   less than 140 mg/dL      Peak postprandial:   less than 180 mg/dL (1-2 hours)      Critically ill patients:  140 - 180 mg/dL   Lab Results  Component Value Date   GLUCAP 166 (H) 10/09/2023   HGBA1C 6.7 (A) 08/02/2023    Review of Glycemic Control  Latest Reference Range & Units 10/08/23 07:28 10/08/23 11:32 10/08/23 16:02 10/08/23 20:29 10/09/23 07:33  Glucose-Capillary 70 - 99 mg/dL 914 (H) 782 (H) 956 (H) 170 (H) 166 (H)   Diabetes history: DM 2 Outpatient Diabetes medications: Trulicity 1.5 mg QThursday, Novolog 0-3 units tid, Farxiga 10 mg Daily, Glipizide 10 mg bid, Tresiba 30 units Daily Current orders for Inpatient glycemic control:  Semglee 30 units Daily Novolog 0-15 units tid and hs Glipizide 10 mg Daily, Farxiga 10 mg Daily  Inpatient Diabetes Program Recommendations:    Glucose trends increase after PO intake due to steroid dose. If steroid dose remains the same.......  -  Consider Novolog 3 units tid meal coverage if eating >50% of meals  Thanks,  Christena Deem RN, MSN, BC-ADM Inpatient Diabetes Coordinator Team Pager [phone] (8a-5p)

## 2023-10-09 NOTE — Plan of Care (Signed)
  Problem: Education: Goal: Knowledge of General Education information will improve Description: Including pain rating scale, medication(s)/side effects and non-pharmacologic comfort measures Outcome: Progressing   Problem: Health Behavior/Discharge Planning: Goal: Ability to manage health-related needs will improve Outcome: Progressing   Problem: Clinical Measurements: Goal: Ability to maintain clinical measurements within normal limits will improve Outcome: Progressing Goal: Will remain free from infection Outcome: Progressing Goal: Diagnostic test results will improve Outcome: Progressing Goal: Respiratory complications will improve Outcome: Progressing Goal: Cardiovascular complication will be avoided Outcome: Progressing   Problem: Activity: Goal: Risk for activity intolerance will decrease Outcome: Progressing   Problem: Nutrition: Goal: Adequate nutrition will be maintained Outcome: Progressing   Problem: Coping: Goal: Level of anxiety will decrease Outcome: Progressing   Problem: Elimination: Goal: Will not experience complications related to bowel motility Outcome: Progressing Goal: Will not experience complications related to urinary retention Outcome: Progressing   Problem: Pain Managment: Goal: General experience of comfort will improve and/or be controlled Outcome: Progressing   Problem: Safety: Goal: Ability to remain free from injury will improve Outcome: Progressing   Problem: Skin Integrity: Goal: Risk for impaired skin integrity will decrease Outcome: Progressing   Problem: Education: Goal: Knowledge of disease or condition will improve Outcome: Progressing Goal: Knowledge of the prescribed therapeutic regimen will improve Outcome: Progressing Goal: Individualized Educational Video(s) Outcome: Progressing   Problem: Activity: Goal: Ability to tolerate increased activity will improve Outcome: Progressing Goal: Will verbalize the  importance of balancing activity with adequate rest periods Outcome: Progressing   Problem: Respiratory: Goal: Ability to maintain a clear airway will improve Outcome: Progressing Goal: Levels of oxygenation will improve Outcome: Progressing Goal: Ability to maintain adequate ventilation will improve Outcome: Progressing   Problem: Activity: Goal: Ability to tolerate increased activity will improve Outcome: Progressing   Problem: Clinical Measurements: Goal: Ability to maintain a body temperature in the normal range will improve Outcome: Progressing   Problem: Respiratory: Goal: Ability to maintain adequate ventilation will improve Outcome: Progressing Goal: Ability to maintain a clear airway will improve Outcome: Progressing   Problem: Education: Goal: Ability to describe self-care measures that may prevent or decrease complications (Diabetes Survival Skills Education) will improve Outcome: Progressing Goal: Individualized Educational Video(s) Outcome: Progressing   Problem: Coping: Goal: Ability to adjust to condition or change in health will improve Outcome: Progressing   Problem: Fluid Volume: Goal: Ability to maintain a balanced intake and output will improve Outcome: Progressing   Problem: Health Behavior/Discharge Planning: Goal: Ability to identify and utilize available resources and services will improve Outcome: Progressing Goal: Ability to manage health-related needs will improve Outcome: Progressing   Problem: Metabolic: Goal: Ability to maintain appropriate glucose levels will improve Outcome: Progressing   Problem: Nutritional: Goal: Maintenance of adequate nutrition will improve Outcome: Progressing Goal: Progress toward achieving an optimal weight will improve Outcome: Progressing   Problem: Skin Integrity: Goal: Risk for impaired skin integrity will decrease Outcome: Progressing   Problem: Tissue Perfusion: Goal: Adequacy of tissue  perfusion will improve Outcome: Progressing

## 2023-10-09 NOTE — Progress Notes (Signed)
 Mobility Specialist - Progress Note  (RA) Pre-mobility: 99 bpm HR, 94% SpO2 ([place] 2L) During mobility: 132 bpm HR, 88% SpO2 Post-mobility: 113 bpm HR, 92% SPO2    10/09/23 1116  Mobility  Activity Ambulated independently in hallway  Level of Assistance Independent  Assistive Device None  Distance Ambulated (ft) 500 ft  Range of Motion/Exercises Active  Activity Response Tolerated fair  Mobility Referral Yes  Mobility visit 1 Mobility  Mobility Specialist Start Time (ACUTE ONLY) 1106  Mobility Specialist Stop Time (ACUTE ONLY) 1116  Mobility Specialist Time Calculation (min) (ACUTE ONLY) 10 min   Pt was found in bed and agreeable to ambulate. Grew fatigued with session and stated feeling a little SOB. At EOS returned to room and able to increase SPO2 >90% within 1 min. Was left with all needs met and family in room.  Billey Chang Mobility Specialist

## 2023-10-09 NOTE — Progress Notes (Signed)
 PROGRESS NOTE  Duane Cuevas  UEA:[id number] DOB: 1956/05/29 DOA: 10/07/2023 PCP: Georgina Quint, MD   Brief Narrative: Patient is a 68 year old male with history of diabetes type 2, COPD, chronic hypoxic respiratory failure on 2 L of oxygen at home, coronary artery disease, hyperlipidemia, hypertension, nonruptured cerebral aneurysm who presented with complaint of productive cough with yellow sputum, shortness of breath, left-sided chest pain, wheezing, sore throat, low-grade fever.  Lab work showed WC count of 10, magnesium of 1.6, phosphorus of 2.4, BNP of 841.  COVID/flu/influenza negative.  Chest x-ray showed new streaky and patchy opacity at the left base likely atelectasis but pneumonia not excluded.  Patient had low-grade fever on presentation, tachycardia, stable blood pressure.  Being managed for COPD exacerbation and started on antibiotics for possible community-acquired pneumonia.  Assessment & Plan:  Principal Problem:   Acute on chronic respiratory failure with hypoxia (HCC) Active Problems:   Uncontrolled type 2 diabetes mellitus with hyperglycemia (HCC)   Dyslipidemia associated with type 2 diabetes mellitus (HCC)   CAD (coronary artery disease)   Vertigo   COPD with acute exacerbation (HCC)   Essential hypertension   Acute COPD exacerbation: Presented with increased sputum production, cough, wheezing.  Continue bronchodilators, mucolytics,steroids.  Started on Yupelri, Brovana, continue Pulmicort  Community-acquired pneumonia: Chest x-ray showed possible patchy in the left base.  Pneumonia not excluded.  Had low-grade fever at home.  Started on azithromycin, ceftriaxone.  Follow-up blood cultures,NGTD.  Procalcitonin reassuring  Hypomagnesemia/hypophosphatemia: Continue to monitor and supplement as needed.  Chronic hypoxic respiratory failure: On 2 L of oxygen at home.  Currently at the same  Diabetes type 2: Continue current insulin regimen.  Monitor blood  sugars.  Diabetic monitor following  Hyperlipidemia: On Lipitor  Hypertension: On Amlodipine  Coronary artery disease: On aspirin, calcium channel blocker, statin  Vertigo: On meclizine  Hypophosphatemia: Continue to monitor and supplement  Smoker: Still smoking about 5-8 cigarettes a day.  Counseled cessation          DVT prophylaxis:enoxaparin (LOVENOX) injection 40 mg Start: 10/07/23 2200     Code Status: Full Code  Family Communication: Called and discussed with daughter on phone on 4/6  Patient status:Inpatient  Patient is from :home   Anticipated discharge YN:WGNF  Estimated DC date: 1 to 2 days   Consultants: None  Procedures: None  Antimicrobials:  Anti-infectives (From admission, onward)    Start     Dose/Rate Route Frequency Ordered Stop   10/08/23 1000  cefTRIAXone (ROCEPHIN) 1 g in sodium chloride 0.9 % 100 mL IVPB  Status:  Discontinued        1 g 200 mL/hr over 30 Minutes Intravenous Every 24 hours 10/07/23 1615 10/08/23 0753   10/08/23 1000  azithromycin (ZITHROMAX) 500 mg in sodium chloride 0.9 % 250 mL IVPB        500 mg 250 mL/hr over 60 Minutes Intravenous Every 24 hours 10/07/23 1615 10/12/23 0959   10/08/23 1000  cefTRIAXone (ROCEPHIN) 2 g in sodium chloride 0.9 % 100 mL IVPB        2 g 200 mL/hr over 30 Minutes Intravenous Every 24 hours 10/08/23 0753 10/13/23 0959   10/07/23 1115  cefTRIAXone (ROCEPHIN) 1 g in sodium chloride 0.9 % 100 mL IVPB        1 g 200 mL/hr over 30 Minutes Intravenous  Once 10/07/23 1108 10/07/23 1235   10/07/23 1115  azithromycin (ZITHROMAX) 500 mg in sodium chloride 0.9 % 250 mL IVPB  500 mg 250 mL/hr over 60 Minutes Intravenous  Once 10/07/23 1108 10/07/23 1306       Subjective: Patient seen and examined at bedside today.  Hemodynamically stable.  On 2 L of oxygen.  Overall appeared comfortable.  No respiratory distress.  Still has extensive bilateral wheezing, bringing up sputum.  We discussed  about continuing same treatment for today and possible discharge to home tomorrow if he improves in the morning  Objective: Vitals:   10/08/23 1922 10/09/23 0207 10/09/23 0407 10/09/23 0807  BP: 107/79  125/88   Pulse: 81  (!) 102   Resp: 18  18   Temp: 97.9 F (36.6 C)  97.6 F (36.4 C)   TempSrc: Oral  Oral   SpO2: 94% 94% 100% 98%  Weight:      Height:        Intake/Output Summary (Last 24 hours) at 10/09/2023 0951 Last data filed at 10/09/2023 0600 Gross per 24 hour  Intake 590 ml  Output 3350 ml  Net -2760 ml   Filed Weights   10/07/23 1511  Weight: 74.4 kg    Examination:  General exam: Overall comfortable, not in distress HEENT: PERRL Respiratory system: Diminished air sounds bilaterally,  Expiratory Wheezing Cardiovascular system: S1 & S2 heard, RRR.  Gastrointestinal system: Abdomen is nondistended, soft and nontender. Central nervous system: Alert and oriented Extremities: No edema, no clubbing ,no cyanosis Skin: No rashes, no ulcers,no icterus    Data Reviewed: I have personally reviewed following labs and imaging studies  CBC: Recent Labs  Lab 10/07/23 1055 10/08/23 0510  WBC 10.0 7.3  NEUTROABS 8.9*  --   HGB 15.3 14.8  HCT 46.0 45.3  MCV 90.7 90.8  PLT 247 233   Basic Metabolic Panel: Recent Labs  Lab 10/07/23 1055 10/08/23 0510 10/09/23 0456  NA 133* 134*  --   K 3.5 4.5  --   CL 100 103  --   CO2 24 22  --   GLUCOSE 113* 147*  --   BUN 13 22  --   CREATININE 0.78 0.73  --   CALCIUM 8.6* 8.6*  --   MG 1.6* 2.4  --   PHOS 2.4* 2.2* 3.3     Recent Results (from the past 240 hours)  Culture, blood (routine x 2)     Status: None (Preliminary result)   Collection Time: 10/07/23 10:53 AM   Specimen: BLOOD RIGHT ARM  Result Value Ref Range Status   Specimen Description   Final    BLOOD RIGHT ARM Performed at [facility], [address], [place], [place] [address]    Special Requests   Final    BOTTLES DRAWN AEROBIC AND  ANAEROBIC Blood Culture adequate volume Performed at [facility], [address] [place], [place] [address]    Culture   Final    NO GROWTH 2 DAYS Performed at [facility], [address] [place], [place] [address]    Report Status PENDING  Incomplete  Culture, blood (routine x 2)     Status: None (Preliminary result)   Collection Time: 10/07/23 10:55 AM   Specimen: BLOOD LEFT ARM  Result Value Ref Range Status   Specimen Description   Final    BLOOD LEFT ARM Performed at [facility], [address] [place], [place] [address]    Special Requests   Final    BOTTLES DRAWN AEROBIC AND ANAEROBIC Blood Culture results may not be  optimal due to an inadequate volume of blood received in culture bottles Performed at [facility], [address] [place] [address] [place], [place] [address]    Culture   Final    NO GROWTH 2 DAYS Performed at [facility], [address] [place], [place] [address]    Report Status PENDING  Incomplete  Resp panel by RT-PCR (RSV, Flu A&B, Covid) Anterior Nasal Swab     Status: None   Collection Time: 10/07/23 11:36 AM   Specimen: Anterior Nasal Swab  Result Value Ref Range Status   SARS Coronavirus 2 by RT PCR NEGATIVE NEGATIVE Final    Comment: (NOTE) SARS-CoV-2 target nucleic acids are NOT DETECTED.  The SARS-CoV-2 RNA is generally detectable in upper respiratory specimens during the acute phase of infection. The lowest concentration of SARS-CoV-2 viral copies this assay can detect is 138 copies/mL. A negative result does not preclude SARS-Cov-2 infection and should not be used as the sole basis for treatment or other patient management decisions. A negative result may occur with  improper specimen collection/handling, submission of specimen other than nasopharyngeal swab, presence of viral mutation(s) within the areas targeted by this assay, and inadequate number of viral copies(<138 copies/mL). A negative  result must be combined with clinical observations, patient history, and epidemiological information. The expected result is Negative.  Fact Sheet for Patients:  BloggerCourse.com  Fact Sheet for Healthcare Providers:  SeriousBroker.it  This test is no t yet approved or cleared by the [place] FDA and  has been authorized for detection and/or diagnosis of SARS-CoV-2 by FDA under an Emergency Use Authorization (EUA). This EUA will remain  in effect (meaning this test can be used) for the duration of the COVID-19 declaration under Section 564(b)(1) of the Act, 21 U.S.C.section 360bbb-3(b)(1), unless the authorization is terminated  or revoked sooner.       Influenza A by PCR NEGATIVE NEGATIVE Final   Influenza B by PCR NEGATIVE NEGATIVE Final    Comment: (NOTE) The Xpert Xpress SARS-CoV-2/FLU/RSV plus assay is intended as an aid in the diagnosis of influenza from Nasopharyngeal swab specimens and should not be used as a sole basis for treatment. Nasal washings and aspirates are unacceptable for Xpert Xpress SARS-CoV-2/FLU/RSV testing.  Fact Sheet for Patients: BloggerCourse.com  Fact Sheet for Healthcare Providers: SeriousBroker.it  This test is not yet approved or cleared by the [place] FDA and has been authorized for detection and/or diagnosis of SARS-CoV-2 by FDA under an Emergency Use Authorization (EUA). This EUA will remain in effect (meaning this test can be used) for the duration of the COVID-19 declaration under Section 564(b)(1) of the Act, 21 U.S.C. section 360bbb-3(b)(1), unless the authorization is terminated or revoked.     Resp Syncytial Virus by PCR NEGATIVE NEGATIVE Final    Comment: (NOTE) Fact Sheet for Patients: BloggerCourse.com  Fact Sheet for Healthcare Providers: SeriousBroker.it  This  test is not yet approved or cleared by the [place] FDA and has been authorized for detection and/or diagnosis of SARS-CoV-2 by FDA under an Emergency Use Authorization (EUA). This EUA will remain in effect (meaning this test can be used) for the duration of the COVID-19 declaration under Section 564(b)(1) of the Act, 21 U.S.C. section 360bbb-3(b)(1), unless the authorization is terminated or revoked.  Performed at [facility], [address] [place], [place] [address]   Expectorated Sputum Assessment w Gram Stain, Rflx to Resp Cult     Status: None   Collection  Time: 10/07/23 10:35 PM   Specimen: Sputum  Result Value Ref Range Status   Specimen Description SPUTUM  Final   Special Requests NONE  Final   Sputum evaluation   Final    THIS SPECIMEN IS ACCEPTABLE FOR SPUTUM CULTURE Performed at [facility], [address], [place], [place] [address]    Report Status 10/07/2023 FINAL  Final  Culture, Respiratory w Gram Stain     Status: None (Preliminary result)   Collection Time: 10/07/23 10:35 PM   Specimen: SPU  Result Value Ref Range Status   Specimen Description   Final    SPUTUM Performed at [facility], [address] [place], [place] [address]    Special Requests   Final    NONE Reflexed from [id number] Performed at [facility], [address] [place], [place] [address]    Gram Stain   Final    RARE WBC PRESENT, PREDOMINANTLY PMN RARE GRAM POSITIVE COCCI Performed at [facility], [address], [place], [place] [address]    Culture PENDING  Incomplete   Report Status PENDING  Incomplete     Radiology Studies: DG Chest Portable 1 View Result Date: 10/07/2023 CLINICAL DATA:  Shortness of breath for 3 days. EXAM: PORTABLE CHEST 1 VIEW COMPARISON:  03/18/2022 FINDINGS: New streaky and patchy opacity at the left base may reflect atelectasis although pneumonia is not excluded. Right  lung clear. The cardiopericardial silhouette is within normal limits for size. Status post bilateral shoulder replacement. IMPRESSION: New streaky and patchy opacity at the left base may reflect atelectasis although pneumonia is not excluded. Electronically Signed   By: Kennith Center M.D.   On: 10/07/2023 11:04    Scheduled Meds:  amLODipine  5 mg Oral Daily   arformoterol  15 mcg Nebulization BID   aspirin EC  81 mg Oral Daily   atorvastatin  20 mg Oral Daily   budesonide (PULMICORT) nebulizer solution  0.5 mg Nebulization BID   dapagliflozin propanediol  10 mg Oral Daily   enoxaparin (LOVENOX) injection  40 mg Subcutaneous Q24H   glipiZIDE  10 mg Oral QAC breakfast   insulin aspart  0-15 Units Subcutaneous TID AC & HS   insulin aspart  3 Units Subcutaneous TID WC   insulin glargine-yfgn  30 Units Subcutaneous Daily   methylPREDNISolone (SOLU-MEDROL) injection  40 mg Intravenous Q12H   pantoprazole  40 mg Oral Daily   phosphorus  500 mg Oral TID   revefenacin  175 mcg Nebulization Daily   Continuous Infusions:  azithromycin Stopped (10/08/23 1213)   cefTRIAXone (ROCEPHIN)  IV 2 g (10/09/23 0832)     LOS: 2 days   Burnadette Pop, MD [facility] P4/01/2024, 9:51 AM

## 2023-10-10 ENCOUNTER — Other Ambulatory Visit (HOSPITAL_COMMUNITY): Payer: Self-pay

## 2023-10-10 DIAGNOSIS — J9621 Acute and chronic respiratory failure with hypoxia: Secondary | ICD-10-CM | POA: Diagnosis not present

## 2023-10-10 LAB — CULTURE, RESPIRATORY W GRAM STAIN

## 2023-10-10 LAB — GLUCOSE, CAPILLARY
Glucose-Capillary: 207 mg/dL — ABNORMAL HIGH (ref 70–99)
Glucose-Capillary: 215 mg/dL — ABNORMAL HIGH (ref 70–99)

## 2023-10-10 MED ORDER — PREDNISONE 20 MG PO TABS
40.0000 mg | ORAL_TABLET | Freq: Every day | ORAL | Status: DC
  Administered 2023-10-10: 40 mg via ORAL
  Filled 2023-10-10: qty 2

## 2023-10-10 MED ORDER — PREDNISONE 20 MG PO TABS
40.0000 mg | ORAL_TABLET | Freq: Every day | ORAL | 0 refills | Status: AC
  Filled 2023-10-10: qty 8, 4d supply, fill #0

## 2023-10-10 MED ORDER — AMOXICILLIN-POT CLAVULANATE 875-125 MG PO TABS
1.0000 | ORAL_TABLET | Freq: Two times a day (BID) | ORAL | 0 refills | Status: AC
  Filled 2023-10-10: qty 4, 2d supply, fill #0

## 2023-10-10 MED ORDER — AMOXICILLIN-POT CLAVULANATE 875-125 MG PO TABS: 1.0000 | ORAL_TABLET | Freq: Two times a day (BID) | ORAL | Status: DC

## 2023-10-10 NOTE — Discharge Summary (Signed)
 Physician Discharge Summary  Duane Cuevas UJW:[id number] DOB: Oct 25, 1955 DOA: 10/07/2023  PCP: Georgina Quint, MD  Admit date: 10/07/2023 Discharge date: 10/10/2023  Admitted From: Home Disposition:  Home  Discharge Condition:Stable CODE STATUS:FULL Diet recommendation: Heart Healthy  Brief/Interim Summary: Patient is a 68 year old male with history of diabetes type 2, COPD, chronic hypoxic respiratory failure on 2 L of oxygen at home, coronary artery disease, hyperlipidemia, hypertension, nonruptured cerebral aneurysm who presented with complaint of productive cough with yellow sputum, shortness of breath, left-sided chest pain, wheezing, sore throat, low-grade fever. Lab work showed WC count of 10, magnesium of 1.6, phosphorus of 2.4, BNP of 841. COVID/flu/influenza negative. Chest x-ray showed new streaky and patchy opacity at the left base likely atelectasis but pneumonia not excluded. Patient had low-grade fever on presentation, tachycardia, stable blood pressure. Being managed for COPD exacerbation and started on antibiotics for possible community-acquired pneumonia.  Now clinically  improved.  On baseline oxygen requirement.  Medically stable for discharge home today  Following problems were addressed during the hospitalization:   Acute COPD exacerbation: Presented with increased sputum production, cough, wheezing.  Continue bronchodilators, mucolytics,steroids.  Started on Yupelri, Brovana, continue Pulmicort.  Feels lot better today.  At baseline oxygen requirement   Community-acquired pneumonia: Chest x-ray showed possible patchy in the left base.  Pneumonia not excluded.  Had low-grade fever at home.  Started on azithromycin, ceftriaxone.  Follow-up blood cultures,NGTD.  Procalcitonin reassuring.  Antibiotics changed to oral   Hypomagnesemia/hypophosphatemia: supplemented and  corrected  Chronic hypoxic respiratory failure: On 2 L of oxygen at home.  Currently at the same    Diabetes type 2: Continue current insulin regimen from home   Hyperlipidemia: On Lipitor   Hypertension: On Amlodipine   Coronary artery disease: On aspirin, calcium channel blocker, statin   Vertigo: On meclizine   Hypophosphatemia: Supplemented and corrected   Smoker: Still smoking about 5-8 cigarettes a day.  Counseled cessation  Discharge Diagnoses:  Principal Problem:   Acute on chronic respiratory failure with hypoxia (HCC) Active Problems:   Uncontrolled type 2 diabetes mellitus with hyperglycemia (HCC)   Dyslipidemia associated with type 2 diabetes mellitus (HCC)   CAD (coronary artery disease)   Vertigo   COPD with acute exacerbation [facility])   Essential hypertension    Discharge Instructions  Discharge Instructions     Diet Carb Modified   Complete by: As directed    Discharge instructions   Complete by: As directed    1)Please take your prescribed medications as instructed 2)Follow up with your PCP in a week 3)Please stop smoking   Increase activity slowly   Complete by: As directed       Allergies as of 10/10/2023   No Known Allergies      Medication List     TAKE these medications    albuterol 108 (90 Base) MCG/ACT inhaler Commonly known as: VENTOLIN HFA INHALAR 2 BOCANADAS POR VIA ORAL CADA 6 HORAS SEGUN SEA NECESARIO PARA SIBILANCIAS O FALTA DE AIRE What changed: See the new instructions.   albuterol (2.5 MG/3ML) 0.083% nebulizer solution Commonly known as: PROVENTIL INHALAR 1 FRASCO VIA NEBULIZADOR CADA 6 HORAS SEGUN SEA NECESARIO PARA SIBILANCIAS O FALTA DE AIRE What changed: See the new instructions.   amLODipine 5 MG tablet Commonly known as: NORVASC TOMAR 1 TABLETA POR VAI ORAL UNA VEZ AL DIA What changed: See the new instructions.   amoxicillin-clavulanate 875-125 MG tablet Commonly known as: AUGMENTIN Take 1 tablet by mouth every 12 (  twelve) hours for 2 days. Start taking on: October 11, 2023   aspirin EC 81 MG tablet Take 1  tablet (81 mg total) by mouth daily. Swallow whole. What changed: when to take this   atorvastatin 20 MG tablet Commonly known as: LIPITOR TOMAR 1 TABLETA POR VIA ORAL UNA VEZ AL DIA What changed: See the new instructions.   B-D ULTRAFINE III SHORT PEN 31G X 8 MM Misc Generic drug: Insulin Pen Needle USAR PARA INYECTAR INSULINA   blood glucose meter kit and supplies Dispense based on patient and insurance preference. Use up to four times daily as directed. (FOR ICD-10 E10.9, E11.9).   blood glucose meter kit and supplies Kit Dispense based on patient and insurance preference. Use up to four times daily as directed.   Breztri Aerosphere 160-9-4.8 MCG/ACT Aero inhaler Generic drug: budeson-glycopyrrolate-formoterol INHALAR 2 BOCANADAS POR VIA ORAL DOS VECES AL DIA   cyclobenzaprine 10 MG tablet Commonly known as: FLEXERIL Take 1 tablet (10 mg total) by mouth 2 (two) times daily as needed for muscle spasms.   docusate sodium 100 MG capsule Commonly known as: COLACE Take 1 capsule (100 mg total) by mouth 2 (two) times daily.   Farxiga 10 MG Tabs tablet Generic drug: dapagliflozin propanediol TOMAR 1 TABLETA POR VIA ORAL UNA VEZ AL DIA What changed: See the new instructions.   gabapentin 300 MG capsule Commonly known as: Neurontin Take 1 capsule (300 mg total) by mouth at bedtime as needed. What changed: when to take this   glipiZIDE 10 MG tablet Commonly known as: GLUCOTROL TOMAR 1 TABLETA POR VIA ORAL UNA VEZ AL DIA ANTES DEL DESAYUNO *TOMAR 1 TABLETA ADICIONAL SI AZUCAR EN LA SANGRE ESTA ALTA* What changed: See the new instructions.   ibuprofen 800 MG tablet Commonly known as: ADVIL Take 1 tablet (800 mg total) by mouth every 8 (eight) hours as needed.   meclizine 25 MG tablet Commonly known as: ANTIVERT Take 1 tablet (25 mg total) by mouth 3 (three) times daily as needed for dizziness.   methocarbamol 500 MG tablet Commonly known as: ROBAXIN Take 1 tablet (500 mg  total) by mouth 2 (two) times daily.   mupirocin ointment 2 % Commonly known as: BACTROBAN Apply 1 Application topically daily. 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  Follow discharge instructions on paperwork for duration   NovoLOG FlexPen 100 UNIT/ML FlexPen Generic drug: insulin aspart INYECTAR 3 UNIDADES POR VIA SUBCUTANEA TRES VECES AL DIA COMO INDICADO. AJUSTAR CANTIDAD DE INSULINA POR ESCALA MOVIL. MAS DOSIS 30 UNIDADES What changed:  how much to take how to take this when to take this additional instructions   ONE TOUCH ULTRA 2 w/Device Kit Use as directed to check blood glucose up to 2 times a day   OneTouch Ultra test strip Generic drug: glucose blood USE TO CHECK BLOOD SUGAR AS INSTRUCTED   pantoprazole 40 MG tablet Commonly known as: PROTONIX TOMAR 1 TABLETA POR VIA ORAL UNA VEZ AL DIA What changed: See the new instructions.   predniSONE 20 MG tablet Commonly known as: DELTASONE Take 2 tablets (40 mg total) by mouth daily with breakfast for 4 days. Start taking on: October 11, 2023   sildenafil 100 MG tablet Commonly known as: Viagra Take 0.5-1 tablets (50-100 mg total) by mouth daily as needed for erectile dysfunction.   Evaristo Bury FlexTouch 100 UNIT/ML FlexTouch Pen Generic drug: insulin degludec INYECTAR 25 UNIDADES POR VIA SUBCUTANEA DIARIAMENTE What changed: See the new instructions.  Trulicity 1.5 MG/0.5ML Soaj Generic drug: Dulaglutide INYECTAR CONTENIDOS DE UN LAPIZ POR VIA SUBCUTANEA CADA SEMANA, EL MISMO DIA CADA SEMANA What changed:  how much to take how to take this when to take this additional instructions   Ubrelvy 100 MG Tabs Generic drug: Ubrogepant TOMAR 1 TALBETA POR VIA ORAL UNA VEZ AL DIA        No Known Allergies  Consultations: None   Procedures/Studies: DG Chest Portable 1 View Result Date: 10/07/2023 CLINICAL DATA:  Shortness of breath for 3 days. EXAM: PORTABLE CHEST 1 VIEW COMPARISON:  03/18/2022  FINDINGS: New streaky and patchy opacity at the left base may reflect atelectasis although pneumonia is not excluded. Right lung clear. The cardiopericardial silhouette is within normal limits for size. Status post bilateral shoulder replacement. IMPRESSION: New streaky and patchy opacity at the left base may reflect atelectasis although pneumonia is not excluded. Electronically Signed   By: Kennith Center M.D.   On: 10/07/2023 11:04   CT ANGIO HEAD NECK W WO CM Result Date: 09/27/2023 CLINICAL DATA:  Neuro deficit, acute, stroke suspected. Left-sided headache for 1 week with pain extending into the neck. EXAM: CT ANGIOGRAPHY HEAD AND NECK WITH AND WITHOUT CONTRAST TECHNIQUE: Multidetector CT imaging of the head and neck was performed using the standard protocol during bolus administration of intravenous contrast. Multiplanar CT image reconstructions and MIPs were obtained to evaluate the vascular anatomy. Carotid stenosis measurements (when applicable) are obtained utilizing NASCET criteria, using the distal internal carotid diameter as the denominator. RADIATION DOSE REDUCTION: This exam was performed according to the departmental dose-optimization program which includes automated exposure control, adjustment of the mA and/or kV according to patient size and/or use of iterative reconstruction technique. CONTRAST:  75mL OMNIPAQUE IOHEXOL 350 MG/ML SOLN COMPARISON:  MRA head 06/13/2022. CT head 03/14/2022. CTA head and neck 06/26/2020. FINDINGS: CT HEAD FINDINGS Brain: There is no evidence of an acute infarct, intracranial hemorrhage, mass, midline shift, or extra-axial fluid collection. Cerebral volume is normal. The ventricles are normal in size. Vascular: Reported below. Skull: No acute fracture or suspicious lesion. Sinuses/Orbits: Minimal mucosal thickening in the maxillary sinus that minimal mucosal thickening in the sphenoid sinuses. Clear mastoid air cells. Unremarkable orbits. Other: None. Review of the  MIP images confirms the above findings CTA NECK FINDINGS Aortic arch: Standard branching with mild atherosclerosis. No significant stenosis of the arch vessel origins. Right carotid system: Patent with a small amount of calcified plaque in the proximal ICA. No evidence of a significant stenosis or dissection. Left carotid system: Patent without evidence of stenosis or dissection. Vertebral arteries: Patent and codominant. Small amount of calcified plaque at the right vertebral artery origin. No evidence of a significant stenosis or dissection. Skeleton: Moderate cervical spondylosis. Interbody ankylosis at C6-7. Mild chronic T1 compression fracture. Other neck: No evidence of cervical lymphadenopathy or mass. Upper chest: Moderate paraseptal and centrilobular emphysema. Bronchial wall thickening. Review of the MIP images confirms the above findings CTA HEAD FINDINGS Anterior circulation: The internal carotid arteries are patent from skull base to carotid termini with mild atherosclerosis bilaterally not resulting in a significant stenosis. A 2 mm left paraophthalmic ICA aneurysm is unchanged from 2021. ACAs and MCAs are patent without evidence of a proximal branch occlusion or significant proximal stenosis. Posterior circulation: The intracranial vertebral arteries are widely patent to the basilar. Patent PICA, AICA, and SCA origins are visualized bilaterally. The basilar artery is widely patent. There are large right and small left posterior communicating arteries with  hypoplasia or absence of the right P1 segment. Both PCAs are patent without evidence of a significant proximal stenosis. No aneurysm is identified. Venous sinuses: As permitted by contrast timing, patent. Anatomic variants: Fetal right PCA. Review of the MIP images confirms the above findings IMPRESSION: 1. No evidence of acute intracranial abnormality. 2. Mild atherosclerosis in the head and neck without a large vessel occlusion, significant  proximal stenosis, or dissection. 3. 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 Electronically Signed   By: Sebastian Ache M.D.   On: 09/27/2023 17:06   DG Cervical Spine Complete Result Date: 09/27/2023 CLINICAL DATA:  Left-sided headache. EXAM: CERVICAL SPINE - COMPLETE 4+ VIEW COMPARISON:  Spine radiograph 12/28/2022 FINDINGS: Visualization through C6 on lateral view. Normal anatomic alignment. Multilevel degenerative disc disease most pronounced C4-5 and C5-6. Multilevel facet degenerative changes. Prevertebral soft tissues unremarkable. Visualized lung apices are clear. IMPRESSION: Multilevel degenerative disc and facet disease. Electronically Signed   By: Annia Belt M.D.   On: 09/27/2023 11:30      Subjective: Patient seen and examined at bedside today.  Hemodynamically stable.  On baseline oxygen requirement.  Feels better today.  Denies any worsening shortness of breath or cough.  Medically stable for discharge  Discharge Exam: Vitals:   10/10/23 0450 10/10/23 0830  BP: 126/86   Pulse: 87   Resp: 18   Temp: 98.3 F (36.8 C)   SpO2: 95% 95%   Vitals:   10/09/23 1322 10/09/23 1932 10/10/23 0450 10/10/23 0830  BP: 127/77 116/75 126/86   Pulse: (!) 102 89 87   Resp:  17 18   Temp: 98.4 F (36.9 C) 98.2 F (36.8 C) 98.3 F (36.8 C)   TempSrc: Oral Oral Oral   SpO2: 92% 96% 95% 95%  Weight:      Height:        General: Pt is alert, awake, not in acute distress Cardiovascular: RRR, S1/S2 +, no rubs, no gallops Respiratory: Mild bilateral wheezing, diminished breath sounds bilaterally in bases Abdominal: Soft, NT, ND, bowel sounds + Extremities: no edema, no cyanosis    The results of significant diagnostics from this hospitalization (including imaging, microbiology, ancillary and laboratory) are listed below for reference.     Microbiology: Recent Results (from the past 240 hours)  Culture, blood (routine x 2)     Status: None (Preliminary result)   Collection  Time: 10/07/23 10:53 AM   Specimen: BLOOD RIGHT ARM  Result Value Ref Range Status   Specimen Description   Final    BLOOD RIGHT ARM Performed at [facility], [address], [place], [place] [address]    Special Requests   Final    BOTTLES DRAWN AEROBIC AND ANAEROBIC Blood Culture adequate volume Performed at [facility], [address] [place], [place] [address]    Culture   Final    NO GROWTH 3 DAYS Performed at [facility], [address], [place], [place] [address]    Report Status PENDING  Incomplete  Culture, blood (routine x 2)     Status: None (Preliminary result)   Collection Time: 10/07/23 10:55 AM   Specimen: BLOOD LEFT ARM  Result Value Ref Range Status   Specimen Description   Final    BLOOD LEFT ARM Performed at [facility], [address], [place], [place] [address]    Special Requests   Final    BOTTLES DRAWN AEROBIC AND ANAEROBIC Blood Culture results may not  be optimal due to an inadequate volume of blood received in culture bottles Performed at [facility], [address] [place], [place] [address]    Culture   Final    NO GROWTH 3 DAYS Performed at [facility], [address] [place], [place] [address]    Report Status PENDING  Incomplete  Resp panel by RT-PCR (RSV, Flu A&B, Covid) Anterior Nasal Swab     Status: None   Collection Time: 10/07/23 11:36 AM   Specimen: Anterior Nasal Swab  Result Value Ref Range Status   SARS Coronavirus 2 by RT PCR NEGATIVE NEGATIVE Final    Comment: (NOTE) SARS-CoV-2 target nucleic acids are NOT DETECTED.  The SARS-CoV-2 RNA is generally detectable in upper respiratory specimens during the acute phase of infection. The lowest concentration of SARS-CoV-2 viral copies this assay can detect is 138 copies/mL. A negative result does not preclude SARS-Cov-2 infection and should not be used as the sole basis for treatment or other patient management  decisions. A negative result may occur with  improper specimen collection/handling, submission of specimen other than nasopharyngeal swab, presence of viral mutation(s) within the areas targeted by this assay, and inadequate number of viral copies(<138 copies/mL). A negative result must be combined with clinical observations, patient history, and epidemiological information. The expected result is Negative.  Fact Sheet for Patients:  BloggerCourse.com  Fact Sheet for Healthcare Providers:  SeriousBroker.it  This test is no t yet approved or cleared by the [place] FDA and  has been authorized for detection and/or diagnosis of SARS-CoV-2 by FDA under an Emergency Use Authorization (EUA). This EUA will remain  in effect (meaning this test can be used) for the duration of the COVID-19 declaration under Section 564(b)(1) of the Act, 21 U.S.C.section 360bbb-3(b)(1), unless the authorization is terminated  or revoked sooner.       Influenza A by PCR NEGATIVE NEGATIVE Final   Influenza B by PCR NEGATIVE NEGATIVE Final    Comment: (NOTE) The Xpert Xpress SARS-CoV-2/FLU/RSV plus assay is intended as an aid in the diagnosis of influenza from Nasopharyngeal swab specimens and should not be used as a sole basis for treatment. Nasal washings and aspirates are unacceptable for Xpert Xpress SARS-CoV-2/FLU/RSV testing.  Fact Sheet for Patients: BloggerCourse.com  Fact Sheet for Healthcare Providers: SeriousBroker.it  This test is not yet approved or cleared by the [place] FDA and has been authorized for detection and/or diagnosis of SARS-CoV-2 by FDA under an Emergency Use Authorization (EUA). This EUA will remain in effect (meaning this test can be used) for the duration of the COVID-19 declaration under Section 564(b)(1) of the Act, 21 U.S.C. section 360bbb-3(b)(1), unless the  authorization is terminated or revoked.     Resp Syncytial Virus by PCR NEGATIVE NEGATIVE Final    Comment: (NOTE) Fact Sheet for Patients: BloggerCourse.com  Fact Sheet for Healthcare Providers: SeriousBroker.it  This test is not yet approved or cleared by the [place] FDA and has been authorized for detection and/or diagnosis of SARS-CoV-2 by FDA under an Emergency Use Authorization (EUA). This EUA will remain in effect (meaning this test can be used) for the duration of the COVID-19 declaration under Section 564(b)(1) of the Act, 21 U.S.C. section 360bbb-3(b)(1), unless the authorization is terminated or revoked.  Performed at [facility], [address], [place], [place] [address]   Expectorated Sputum Assessment w Gram Stain, Rflx to Resp Cult     Status: None  Collection Time: 10/07/23 10:35 PM   Specimen: Sputum  Result Value Ref Range Status   Specimen Description SPUTUM  Final   Special Requests NONE  Final   Sputum evaluation   Final    THIS SPECIMEN IS ACCEPTABLE FOR SPUTUM CULTURE Performed at [facility], [address] [place], [place] [address]    Report Status 10/07/2023 FINAL  Final  Culture, Respiratory w Gram Stain     Status: None (Preliminary result)   Collection Time: 10/07/23 10:35 PM   Specimen: SPU  Result Value Ref Range Status   Specimen Description   Final    SPUTUM Performed at [facility], [address], [place], [place] [address]    Special Requests   Final    NONE Reflexed from [phone] Performed at [facility], [address] [place], [place] [address]    Gram Stain   Final    RARE WBC PRESENT, PREDOMINANTLY PMN RARE GRAM POSITIVE COCCI    Culture   Final    CULTURE REINCUBATED FOR BETTER GROWTH Performed at [facility], [address] [place], [place] [address]    Report Status PENDING   Incomplete     Labs: BNP (last 3 results) Recent Labs    10/07/23 1055  BNP 141.1*   Basic Metabolic Panel: Recent Labs  Lab 10/07/23 1055 10/08/23 0510 10/09/23 0456  NA 133* 134*  --   K 3.5 4.5  --   CL 100 103  --   CO2 24 22  --   GLUCOSE 113* 147*  --   BUN 13 22  --   CREATININE 0.78 0.73  --   CALCIUM 8.6* 8.6*  --   MG 1.6* 2.4  --   PHOS 2.4* 2.2* 3.3   Liver Function Tests: Recent Labs  Lab 10/08/23 0510  AST 17  ALT 20  ALKPHOS 72  BILITOT 0.5  PROT 7.0  ALBUMIN 3.2*   No results for input(s): "LIPASE", "AMYLASE" in the last 168 hours. No results for input(s): "AMMONIA" in the last 168 hours. CBC: Recent Labs  Lab 10/07/23 1055 10/08/23 0510  WBC 10.0 7.3  NEUTROABS 8.9*  --   HGB 15.3 14.8  HCT 46.0 45.3  MCV 90.7 90.8  PLT 247 233   Cardiac Enzymes: No results for input(s): "CKTOTAL", "CKMB", "CKMBINDEX", "TROPONINI" in the last 168 hours. BNP: Invalid input(s): "POCBNP" CBG: Recent Labs  Lab 10/09/23 0733 10/09/23 1130 10/09/23 1657 10/09/23 2023 10/10/23 0737  GLUCAP 166* 278* 201* 279* 207*   D-Dimer No results for input(s): "DDIMER" in the last 72 hours. Hgb A1c No results for input(s): "HGBA1C" in the last 72 hours. Lipid Profile No results for input(s): "CHOL", "HDL", "LDLCALC", "TRIG", "CHOLHDL", "LDLDIRECT" in the last 72 hours. Thyroid function studies No results for input(s): "TSH", "T4TOTAL", "T3FREE", "THYROIDAB" in the last 72 hours.  Invalid input(s): "FREET3" Anemia work up No results for input(s): "VITAMINB12", "FOLATE", "FERRITIN", "TIBC", "IRON", "RETICCTPCT" in the last 72 hours. Urinalysis    Component Value Date/Time   COLORURINE YELLOW 08/04/2023 1120   APPEARANCEUR CLEAR 08/04/2023 1120   LABSPEC 1.024 08/04/2023 1120   PHURINE 5.0 08/04/2023 1120   GLUCOSEU >=500 (A) 08/04/2023 1120   HGBUR SMALL (A) 08/04/2023 1120   BILIRUBINUR NEGATIVE 08/04/2023 1120   KETONESUR NEGATIVE 08/04/2023 1120    PROTEINUR NEGATIVE 08/04/2023 1120   UROBILINOGEN 0.2 10/14/2014 1712   NITRITE NEGATIVE 08/04/2023 1120   LEUKOCYTESUR MODERATE (  A) 08/04/2023 1120   Sepsis Labs Recent Labs  Lab 10/07/23 1055 10/08/23 0510  WBC 10.0 7.3   Microbiology Recent Results (from the past 240 hours)  Culture, blood (routine x 2)     Status: None (Preliminary result)   Collection Time: 10/07/23 10:53 AM   Specimen: BLOOD RIGHT ARM  Result Value Ref Range Status   Specimen Description   Final    BLOOD RIGHT ARM Performed at [facility], [address], [place], [place] [address]    Special Requests   Final    BOTTLES DRAWN AEROBIC AND ANAEROBIC Blood Culture adequate volume Performed at [facility], [address], [place], [place] [address]    Culture   Final    NO GROWTH 3 DAYS Performed at [facility], [address], [place], [place] [address]    Report Status PENDING  Incomplete  Culture, blood (routine x 2)     Status: None (Preliminary result)   Collection Time: 10/07/23 10:55 AM   Specimen: BLOOD LEFT ARM  Result Value Ref Range Status   Specimen Description   Final    BLOOD LEFT ARM Performed at [facility], [address] [place], [place] [address]    Special Requests   Final    BOTTLES DRAWN AEROBIC AND ANAEROBIC Blood Culture results may not be optimal due to an inadequate volume of blood received in culture bottles Performed at [facility], [address], [place], [place] [address]    Culture   Final    NO GROWTH 3 DAYS Performed at [facility], [address], [place], [place] [address]    Report Status PENDING  Incomplete  Resp panel by RT-PCR (RSV, Flu A&B, Covid) Anterior Nasal Swab     Status: None   Collection Time: 10/07/23 11:36 AM   Specimen: Anterior Nasal Swab  Result Value Ref Range Status   SARS Coronavirus 2 by RT PCR NEGATIVE NEGATIVE Final    Comment: (NOTE) SARS-CoV-2 target nucleic  acids are NOT DETECTED.  The SARS-CoV-2 RNA is generally detectable in upper respiratory specimens during the acute phase of infection. The lowest concentration of SARS-CoV-2 viral copies this assay can detect is 138 copies/mL. A negative result does not preclude SARS-Cov-2 infection and should not be used as the sole basis for treatment or other patient management decisions. A negative result may occur with  improper specimen collection/handling, submission of specimen other than nasopharyngeal swab, presence of viral mutation(s) within the areas targeted by this assay, and inadequate number of viral copies(<138 copies/mL). A negative result must be combined with clinical observations, patient history, and epidemiological information. The expected result is Negative.  Fact Sheet for Patients:  BloggerCourse.com  Fact Sheet for Healthcare Providers:  SeriousBroker.it  This test is no t yet approved or cleared by the [place] FDA and  has been authorized for detection and/or diagnosis of SARS-CoV-2 by FDA under an Emergency Use Authorization (EUA). This EUA will remain  in effect (meaning this test can be used) for the duration of the COVID-19 declaration under Section 564(b)(1) of the Act, 21 U.S.C.section 360bbb-3(b)(1), unless the authorization is terminated  or revoked sooner.       Influenza A by PCR NEGATIVE NEGATIVE Final   Influenza B by PCR NEGATIVE NEGATIVE Final    Comment: (NOTE) The Xpert Xpress SARS-CoV-2/FLU/RSV plus assay is intended as an aid in the diagnosis of influenza from Nasopharyngeal swab  specimens and should not be used as a sole basis for treatment. Nasal washings and aspirates are unacceptable for Xpert Xpress SARS-CoV-2/FLU/RSV testing.  Fact Sheet for Patients: BloggerCourse.com  Fact Sheet for Healthcare Providers: SeriousBroker.it  This  test is not yet approved or cleared by the [place] FDA and has been authorized for detection and/or diagnosis of SARS-CoV-2 by FDA under an Emergency Use Authorization (EUA). This EUA will remain in effect (meaning this test can be used) for the duration of the COVID-19 declaration under Section 564(b)(1) of the Act, 21 U.S.C. section 360bbb-3(b)(1), unless the authorization is terminated or revoked.     Resp Syncytial Virus by PCR NEGATIVE NEGATIVE Final    Comment: (NOTE) Fact Sheet for Patients: BloggerCourse.com  Fact Sheet for Healthcare Providers: SeriousBroker.it  This test is not yet approved or cleared by the [place] FDA and has been authorized for detection and/or diagnosis of SARS-CoV-2 by FDA under an Emergency Use Authorization (EUA). This EUA will remain in effect (meaning this test can be used) for the duration of the COVID-19 declaration under Section 564(b)(1) of the Act, 21 U.S.C. section 360bbb-3(b)(1), unless the authorization is terminated or revoked.  Performed at [facility], [address] [place], [place] [address]   Expectorated Sputum Assessment w Gram Stain, Rflx to Resp Cult     Status: None   Collection Time: 10/07/23 10:35 PM   Specimen: Sputum  Result Value Ref Range Status   Specimen Description SPUTUM  Final   Special Requests NONE  Final   Sputum evaluation   Final    THIS SPECIMEN IS ACCEPTABLE FOR SPUTUM CULTURE Performed at [facility], [address], [place], [place] [address]    Report Status 10/07/2023 FINAL  Final  Culture, Respiratory w Gram Stain     Status: None (Preliminary result)   Collection Time: 10/07/23 10:35 PM   Specimen: SPU  Result Value Ref Range Status   Specimen Description   Final    SPUTUM Performed at [facility], [address] [place], [place] [address]    Special Requests   Final     NONE Reflexed from [phone] Performed at [facility], [address] [place], [place] [address]    Gram Stain   Final    RARE WBC PRESENT, PREDOMINANTLY PMN RARE GRAM POSITIVE COCCI    Culture   Final    CULTURE REINCUBATED FOR BETTER GROWTH Performed at [facility], [address] [place], [place] [address]    Report Status PENDING  Incomplete    Please note: You were cared for by a hospitalist during your hospital stay. Once you are discharged, your primary care physician will handle any further medical issues. Please note that NO REFILLS for any discharge medications will be authorized once you are discharged, as it is imperative that you return to your primary care physician (or establish a relationship with a primary care physician if you do not have one) for your post hospital discharge needs so that they can reassess your need for medications and monitor your lab values.    Time coordinating discharge: 40 minutes  SIGNED:   Burnadette Pop, MD  [facility] 10/10/2023, 10:51 AM Pager [phone]  If 7PM-7AM, please contact night-coverage [email or web address] Password [email or web address]

## 2023-10-10 NOTE — TOC Initial Note (Signed)
 Transition of Care [facility]) - Initial/Assessment Note    Patient Details  Name: Duane Cuevas MRN: [id number] Date of Birth: June 28, 1956  Transition of Care [facility]) CM/SW Contact:    Lanier Clam, RN Phone Number: 10/10/2023, 12:42 PM  Clinical Narrative: d/c home. Already on home 02. Has own transport home.                  Expected Discharge Plan: Home/Self Care Barriers to Discharge: No Barriers Identified   Patient Goals and CMS Choice Patient states their goals for this hospitalization and ongoing recovery are:: Home CMS Medicare.gov Compare Post Acute Care list provided to:: Patient Choice offered to / list presented to : Patient Okay ownership interest in [facility].provided to:: Patient    Expected Discharge Plan and Services   Discharge Planning Services: CM Consult Post Acute Care Choice: Resumption of Svcs/PTA Provider Living arrangements for the past 2 months: Single Family Home Expected Discharge Date: 10/10/23                                    Prior Living Arrangements/Services Living arrangements for the past 2 months: Single Family Home Lives with:: Spouse   Do you feel safe going back to the place where you live?: Yes               Activities of Daily Living   ADL Screening (condition at time of admission) Independently performs ADLs?: Yes (appropriate for developmental age) Is the patient deaf or have difficulty hearing?: No Does the patient have difficulty seeing, even when wearing glasses/contacts?: No Does the patient have difficulty concentrating, remembering, or making decisions?: No  Permission Sought/Granted Permission sought to share information with : Case Manager Permission granted to share information with : Yes, Verbal Permission Granted              Emotional Assessment              Admission diagnosis:  COPD exacerbation (HCC) [J44.1] Acute on chronic respiratory failure with hypoxia (HCC)  [J96.21] Chest pain, unspecified type [R07.9] Community acquired pneumonia of left lower lobe of lung [J18.9] Patient Active Problem List   Diagnosis Date Noted   Acute on chronic respiratory failure with hypoxia (HCC) 10/07/2023   COPD with acute exacerbation (HCC) 10/07/2023   Essential hypertension 10/07/2023   Vertigo 09/30/2023   Arthritis of right shoulder region 08/20/2023   S/P reverse total shoulder arthroplasty, 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 GOLD III/ active smoker 08/14/2018   COPD (chronic obstructive pulmonary disease) (HCC) 10/14/2014   Dyslipidemia associated with type 2 diabetes mellitus (HCC) 10/14/2014   PCP:  Georgina Quint, MD Pharmacy:   [facility] [address] [place], [place] - [address] [place]. [place] [address] Phone: [phone] Fax: [phone]  [facility] - [address] [place], [place] - [address] [place] [address] Phone: [phone]  Fax: [phone]  [facility] [address] [place] [address] Phone: [phone] Fax: [phone]  [facility] [address] [place] [address] Phone: [phone] Fax: [phone]     Social Drivers of Health (SDOH) Social History: SDOH Screenings   Food Insecurity: No Food Insecurity (10/07/2023)  Housing: Low Risk  (10/07/2023)  Transportation  Needs: No Transportation Needs (10/07/2023)  Utilities: Not At Risk (10/07/2023)  Alcohol Screen: Low Risk  (08/02/2023)  Depression (PHQ2-9): Low Risk  (10/03/2023)  Recent Concern: Depression (PHQ2-9) - Medium Risk (08/02/2023)  Financial Resource Strain: Medium Risk (08/02/2023)  Physical Activity: Insufficiently Active (08/02/2023)  Social Connections: Socially Integrated (10/07/2023)  Stress: No Stress Concern Present (08/02/2023)  Tobacco Use: Medium Risk (10/07/2023)   SDOH Interventions:     Readmission Risk Interventions    03/21/2022   12:55 PM  Readmission Risk Prevention Plan  Post Dischage Appt Complete  Medication Screening Complete  Transportation Screening Complete

## 2023-10-11 ENCOUNTER — Telehealth: Payer: Self-pay

## 2023-10-11 NOTE — Transitions of Care (Post Inpatient/ED Visit) (Signed)
 10/11/2023  Name: Duane Cuevas MRN: [id number] DOB: 09-Mar-1956  Today's TOC FU Call Status: Today's TOC FU Call Status:: Successful TOC FU Call Completed TOC FU Call Complete Date: 10/11/23 Patient's Name and Date of Birth confirmed.  Transition Care Management Follow-up Telephone Call Discharge Facility: [facility]) Type of Discharge: Inpatient Admission Primary Inpatient Discharge Diagnosis:: Acute on chronic respiratory failure with hypoxia How have you been since you were released from the hospital?: Better Any questions or concerns?: No  Items Reviewed: Did you receive and understand the discharge instructions provided?: Yes Medications obtained,verified, and reconciled?: Yes (Medications Reviewed) (Medication reconciliation completed based on recent discharge summary Patient taking medications as instructed and is aware of any changes or dosage adjustments medication regimen. Patient denies questions and reports no barriers to medication adherence) Any new allergies since your discharge?: No Dietary orders reviewed?: Yes Type of Diet Ordered:: Reg Heart Healthy Carb Modified Do you have support at home?: Yes People in Home [RPT]: spouse, child(ren), adult Name of Support/Comfort Primary Source: Wife and Daughter lives next door  Medications Reviewed Today: Medications Reviewed Today     Reviewed by Johnnette Barrios, RN (Registered Nurse) on 10/11/23 at 1559  Med List Status: <None>   Medication Order Taking? Sig Documenting Provider Last Dose Status Informant  albuterol (PROVENTIL) (2.5 MG/3ML) 0.083% nebulizer solution [id number] Yes INHALAR 1 FRASCO VIA NEBULIZADOR CADA 6 HORAS SEGUN SEA NECESARIO PARA SIBILANCIAS O FALTA DE AIRE  Patient taking differently: Take 2.5 mg by nebulization every 6 (six) hours as needed for shortness of breath or wheezing. INHALAR 1 FRASCO VIA NEBULIZADOR CADA 6 HORAS SEGUN SEA NECESARIO PARA SIBILANCIAS O FALTA DE AIRE   Sagardia,  Eilleen Kempf, MD Taking Active Self, Pharmacy Records           Med Note Azucena Fallen   Sat Oct 07, 2023  2:33 PM) LF: 10/03/23 for a 18ds  albuterol (VENTOLIN HFA) 108 (90 Base) MCG/ACT inhaler [id number] Yes INHALAR 2 BOCANADAS POR VIA ORAL CADA 6 HORAS SEGUN SEA NECESARIO PARA SIBILANCIAS O FALTA DE AIRE  Patient taking differently: Inhale 2 puffs into the lungs every 6 (six) hours as needed for wheezing or shortness of breath.   Georgina Quint, MD Taking Active Self, Pharmacy Records           Med Note Azucena Fallen   YQM Oct 07, 2023  2:45 PM) LF: 09/04/23 for a 25ds  amLODipine (NORVASC) 5 MG tablet [id number] Yes TOMAR 1 TABLETA POR VAI ORAL UNA VEZ AL DIA  Patient taking differently: Take 5 mg by mouth in the morning.   Georgina Quint, MD Taking Active Self, Pharmacy Records           Med Note Azucena Fallen   BMW Oct 07, 2023  2:34 PM) LF: 10/03/23 for a 30ds  amoxicillin-clavulanate (AUGMENTIN) 875-125 MG tablet [id number] Yes Take 1 tablet by mouth every 12 (twelve) hours for 2 days. Burnadette Pop, MD Taking Active   aspirin EC 81 MG tablet [id number] Yes Take 1 tablet (81 mg total) by mouth daily. Swallow whole.  Patient taking differently: Take 81 mg by mouth in the morning. Swallow whole.   Magnant, Joycie Peek, PA-C Taking Active Self, Pharmacy Records  atorvastatin (LIPITOR) 20 MG tablet [id number] Yes TOMAR 1 TABLETA POR VIA ORAL UNA VEZ AL DIA  Patient taking differently: Take 20 mg by mouth in the morning.   Georgina Quint, MD Taking Active Self, Pharmacy  Records           Med Note Jory Ee, TYELISHA L   Sat Oct 07, 2023  2:34 PM) LF: 10/03/23 for a 30ds  blood glucose meter kit and supplies [id number] Yes Dispense based on patient and insurance preference. Use up to four times daily as directed. (FOR ICD-10 E10.9, E11.9). Georgina Quint, MD Taking Active Self, Pharmacy Records           Med Note Azucena Fallen   WUJ Oct 07, 2023  2:29 PM)    blood glucose meter kit and supplies KIT [id number] Yes Dispense based on patient and insurance preference. Use up to four times daily as directed. Georgina Quint, MD Taking Active Self, Pharmacy Records           Med Note [facility], TYELISHA L   Sat Oct 07, 2023  2:38 PM)    Blood Glucose Monitoring Suppl (ONE TOUCH ULTRA 2) w/Device KIT [id number] Yes Use as directed to check blood glucose up to 2 times a day Georgina Quint, MD Taking Active Self, Pharmacy Records  Budeson-Glycopyrrol-Formoterol [facility] AEROSPHERE) 160-9-4.8 MCG/ACT Sandrea Matte [id number] Yes INHALAR 2 BOCANADAS POR VIA ORAL DOS VECES AL DIA Georgina Quint, MD Taking Active Self, Pharmacy Records           Med Note Azucena Fallen   Sat Oct 07, 2023  2:43 PM) LF: 09/20/23 for a 30ds  cyclobenzaprine (FLEXERIL) 10 MG tablet [id number] Yes Take 1 tablet (10 mg total) by mouth 2 (two) times daily as needed for muscle spasms. Fayrene Helper, PA-C Taking Active Self, Pharmacy Records           Med Note Azucena Fallen   WNU Oct 07, 2023  2:39 PM) LF: 09/28/23  docusate sodium (COLACE) 100 MG capsule [id number] Yes Take 1 capsule (100 mg total) by mouth 2 (two) times daily. Magnant, Joycie Peek, PA-C Taking Active Self, Pharmacy Records  Dulaglutide (TRULICITY) 1.5 MG/0.5ML Ivory Broad [id number] Yes INYECTAR CONTENIDOS DE UN LAPIZ POR VIA SUBCUTANEA CADA SEMANA, EL MISMO DIA CADA SEMANA  Patient taking differently: Inject 1.5 mg into the skin once a week. On Thursday   Sagardia, Miguel Jose, MD Taking Active Self, Pharmacy Records  FARXIGA 10 MG TABS tablet [id number] Yes TOMAR 1 TABLETA POR VIA ORAL UNA VEZ AL DIA  Patient taking differently: Take 10 mg by mouth in the morning.   Georgina Quint, MD Taking Active Self, Pharmacy Records           Med Note Azucena Fallen   IRJ Oct 07, 2023  2:48 PM) LF: 08/04/23 for a 30ds  gabapentin (NEURONTIN) 300 MG capsule [id number]  Take 1 capsule (300 mg  total) by mouth at bedtime as needed.  Patient taking differently: Take 300 mg by mouth at bedtime.   Magnant, Joycie Peek, PA-C  Active Self, Pharmacy Records           Med Note Azucena Fallen   TKZ Oct 07, 2023  2:39 PM) LF: 09/25/23  glipiZIDE (GLUCOTROL) 10 MG tablet [id number] Yes TOMAR 1 TABLETA POR VIA ORAL UNA VEZ AL DIA ANTES DEL DESAYUNO *TOMAR 1 TABLETA ADICIONAL SI AZUCAR EN LA SANGRE ESTA ALTA*  Patient taking differently: Take 10 mg by mouth 2 (two) times daily before a meal. TOMAR 1 TABLETA POR VIA ORAL UNA VEZ AL DIA ANTES DEL DESAYUNO *TOMAR 1 TABLETA ADICIONAL SI AZUCAR EN LA SANGRE ESTA ALTA*   Sagardia, Paola,  MD Taking Active Self, Pharmacy Records           Med Note Jory Ee, TYELISHA L   Sat Oct 07, 2023  2:50 PM) LF: 08/04/23 for a 30ds  ibuprofen (ADVIL) 800 MG tablet [id number] Yes Take 1 tablet (800 mg total) by mouth every 8 (eight) hours as needed. Magnant, Joycie Peek, PA-C Taking Active Self, Pharmacy Records           Med Note Azucena Fallen   WUJ Oct 07, 2023  2:45 PM) LF: 08/26/23  insulin aspart (NOVOLOG FLEXPEN) 100 UNIT/ML FlexPen [id number] Yes INYECTAR 3 UNIDADES POR VIA SUBCUTANEA TRES VECES AL DIA COMO INDICADO. AJUSTAR CANTIDAD DE INSULINA POR ESCALA MOVIL. MAS DOSIS 30 UNIDADES  Patient taking differently: Inject 0-3 Units into the skin in the morning, at noon, and at bedtime. INYECTAR 3 UNIDADES POR VIA SUBCUTANEA TRES VECES AL DIA COMO INDICADO. AJUSTAR CANTIDAD DE INSULINA POR ESCALA MOVIL. MAS DOSIS 30 UNIDADES.   Georgina Quint, MD Taking Active Self, Pharmacy Records  Insulin Pen Needle (B-D ULTRAFINE III SHORT PEN) 31G X 8 MM MISC [id number] Yes USAR PARA INYECTAR INSULINA Georgina Quint, MD Taking Active Self, Pharmacy Records  meclizine (ANTIVERT) 25 MG tablet [id number] Yes Take 1 tablet (25 mg total) by mouth 3 (three) times daily as needed for dizziness. Margarita Grizzle, MD Taking Active Self, Pharmacy Records            Med Note Azucena Fallen   BMW Oct 07, 2023  2:38 PM) LF: 09/30/23 for a 10ds  methocarbamol (ROBAXIN) 500 MG tablet [id number] Yes Take 1 tablet (500 mg total) by mouth 2 (two) times daily. Lorre Nick, MD Taking Active Self, Pharmacy Records  mupirocin ointment [facility]) 2 % [id number] Yes Apply 1 Application topically daily. 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  Follow discharge instructions on paperwork for duration Magnant, Joycie Peek, PA-C Taking Active Self, Pharmacy Records           Med Note Azucena Fallen   IHK Oct 07, 2023  2:47 PM) LF: 08/09/23 for a 22ds  ONETOUCH ULTRA test strip [id number] Yes USE TO CHECK BLOOD SUGAR AS INSTRUCTED Georgina Quint, MD Taking Active Self, Pharmacy Records  pantoprazole (PROTONIX) 40 MG tablet [id number] Yes TOMAR 1 TABLETA POR VIA ORAL UNA VEZ AL DIA  Patient taking differently: Take 40 mg by mouth in the morning.   Georgina Quint, MD Taking Active Self, Pharmacy Records           Med Note Azucena Fallen   JOA Oct 07, 2023  2:37 PM) LF: 10/03/23 for a 30ds  predniSONE (DELTASONE) 20 MG tablet [id number] Yes Take 2 tablets (40 mg total) by mouth daily with breakfast for 4 days. Burnadette Pop, MD Taking Active   sildenafil (VIAGRA) 100 MG tablet [id number] Yes Take 0.5-1 tablets (50-100 mg total) by mouth daily as needed for erectile dysfunction.  Patient taking differently: Take 50-100 mg by mouth daily as needed for erectile dysfunction.   Georgina Quint, MD Taking Active Self, Pharmacy Records  [facility] 100 UNIT/ML FlexTouch Pen [id number] Yes INYECTAR 25 UNIDADES POR VIA SUBCUTANEA DIARIAMENTE  Patient taking differently: Inject 30 Units into the skin in the morning.   Georgina Quint, MD Taking Active Self, Pharmacy Records           Med Note Azucena Fallen   YHC Oct 07, 2023  2:35 PM) LF: 10/03/23 for  a 60ds  Ubrogepant (UBRELVY) 100 MG TABS [id number] Yes TOMAR 1  TALBETA POR VIA ORAL UNA VEZ AL DIA Sagardia, Eilleen Kempf, MD Taking Active Self, Pharmacy Records           Med Note Azucena Fallen   Sat Oct 07, 2023  4:57 PM)            Medication reconciliation / review completed based on most recent discharge summary and EHR medication list. Confirmed patient is taking all newly prescribed medications as instructed (any discrepancies are noted in review section)   Patient / Caregiver is aware of any changes to and / or  any dosage adjustments to medication regimen. Patient/ Caregiver denies questions at this time and reports no barriers to medication adherence.   Reviewed the following updates with his Daughter   Start Taking  amoxicillin-clavulanate 875-125 MG tablet Commonly known as: AUGMENTIN Take 1 tablet by mouth every 12 (twelve) hours for 2 days. Start taking on: October 11, 2023  predniSONE 20 MG tablet DELTASONE Start taking on:: October 11, 2023 Take 2 tablets (40 mg total) by mouth daily with breakfast for 4 days.   Home Care and Equipment/Supplies: Were Home Health Services Ordered?: No Any new equipment or medical supplies ordered?: No  Functional Questionnaire: Do you need assistance with bathing/showering or dressing?: No Do you need assistance with meal preparation?: No Do you need assistance with eating?: No Do you have difficulty maintaining continence: No Do you need assistance with getting out of bed/getting out of a chair/moving?: No Do you have difficulty managing or taking your medications?: No  Follow up appointments reviewed: PCP Follow-up appointment confirmed?: No (His daughter will call and schedule) MD Provider Line Number:[phone] Given: No Follow-up Provider: Georgina Quint, MD Specialist Hospital Follow-up appointment confirmed?: Yes Date of Specialist follow-up appointment?: 10/13/23 Follow-Up Specialty Provider:: Marrianne Mood Dean  October 13, 2023 10:45 AM [place] Do  you need transportation to your follow-up appointment?: No Do you understand care options if your condition(s) worsen?: Yes-patient verbalized understanding  SDOH Interventions Today    Flowsheet Row Most Recent Value  SDOH Interventions   Food Insecurity Interventions Intervention Not Indicated  Housing Interventions Intervention Not Indicated  Transportation Interventions Intervention Not Indicated, Patient Resources (Friends/Family), Payor Benefit  Utilities Interventions Intervention Not Indicated      Benefits reviewed  Based on current information and Insurance plan -Reviewed benefits accessible to patient, including details about eligibility options for care and  available value based care options  if any areas of needs were identified.  Reviewed patient/  caregiver's ability to access and / or  ability with navigating the benefits system..Amb Referral made if indicted , refer to orders section of note for details   Reviewed goals for care  Patient / Caregiver was encouraged to make informed decisions about their care, actively participate in managing their health condition, and implement lifestyle changes as needed to promote independence and self-management of health care. There were no reported  barriers to care.   TOC program  Patient is at  risk for readmission and / or has history of  high utilization  Discussed VBCI  TOC program and weekly calls to patient to assess condition/status, medication management  and provide support/education as indicated . Patient  and / or Caregive voiced understanding and declined enrollment in the 30-day TOC Program at this time . Faughter stated he was doing well He is aware of follow-up visits and she  will call and schedule a  post hospital visit with his PCP- she will also transport him. She stated they had all,his medication haze as his home O2 and supplies    Follow-up Plan 1.If not already completed- Please call scheduled a post hospital  Follow up appointment  with your  PCP within in 1-2 weeks of your hospital discharge date -when scheduling please ask your Primary MD to get all Hospital records sent to his/her office. Take all your medications and your discharge paperwork with you for your next visit with your Primary MD-   Be sure to request your Primary MD to go over all hospital tests and procedure/radiological results at the follow up appointment ,   2. Follow-up visit  Burnard Bunting [facility] October 13, 2023 10:45 AM [address] [place] [address] [phone]   Patient was encouraged to Contact PCP with any questions or concerns regarding ongoing medical care, any difficulty obtaining or picking up prescriptions, any changes or worsening in condition including signs / symptoms not relieved  with interventions Patient had no additional questions or concerns at this time.   The patient has been provided with contact information for the care management team and has been advised to call with any health-related questions or concerns. Follow up with their  PCP, Specialists or  additional Healthcare Providers as scheduled,  sooner if concerns arise    Susa Loffler , BSN, RN [facility]   VBCI-Population Health RN Care Manager Direct Dial [phone]  Fax: [phone] Website: [email or web address]

## 2023-10-12 LAB — CULTURE, BLOOD (ROUTINE X 2): Special Requests: ADEQUATE

## 2023-10-13 ENCOUNTER — Ambulatory Visit: Admitting: Orthopedic Surgery

## 2023-10-23 ENCOUNTER — Ambulatory Visit (INDEPENDENT_AMBULATORY_CARE_PROVIDER_SITE_OTHER): Admitting: Surgical

## 2023-10-23 ENCOUNTER — Encounter: Payer: Self-pay | Admitting: Surgical

## 2023-10-23 ENCOUNTER — Other Ambulatory Visit: Payer: Self-pay | Admitting: Emergency Medicine

## 2023-10-23 DIAGNOSIS — E1165 Type 2 diabetes mellitus with hyperglycemia: Secondary | ICD-10-CM

## 2023-10-23 DIAGNOSIS — M542 Cervicalgia: Secondary | ICD-10-CM

## 2023-10-23 NOTE — Telephone Encounter (Signed)
 Copied from CRM [id number]. Topic: Clinical - Medication Refill >> Oct 23, 2023 12:03 PM Marlan Silva wrote: Most Recent Primary Care Visit:  Provider: Elvira Hammersmith  Department: [facility]  Visit Type: HOSPITAL FOLLOW UP  Date: 10/03/2023  Medication: atorvastatin  (LIPITOR) 20 MG tablet   Has the patient contacted their pharmacy? Yes (Agent: If no, request that the patient contact the pharmacy for the refill. If patient does not wish to contact the pharmacy document the reason why and proceed with request.) (Agent: If yes, when and what did the pharmacy advise?)  Is this the correct pharmacy for this prescription? Yes If no, delete pharmacy and type the correct one.  This is the patient's preferred pharmacy:   [facility] [place]  - [place], [place] - [address] [place] [address] Phone: [phone] Fax: [phone]   Has the prescription been filled recently? No  Is the patient out of the medication? Yes  Has the patient been seen for an appointment in the last year OR does the patient have an upcoming appointment? Yes  Can we respond through MyChart? Yes  Agent: Please be advised that Rx refills may take up to 3 business days. We ask that you follow-up with your pharmacy.

## 2023-10-23 NOTE — Progress Notes (Signed)
 Office Visit Note   Patient: Duane Cuevas           Date of Birth: [birth year]           MRN: [id number] Visit Date: 10/23/2023 Requested by: Elvira Hammersmith, MD [address] [place],  [place] [address] PCP: Elvira Hammersmith, MD  Subjective: Chief Complaint  Patient presents with   Neck - Pain, Follow-up    MRI review    HPI: Duane Cuevas is a 68 y.o. male who presents to the office for MRI review. Patient denies any changes in symptoms.  Continues to complain mainly of right shoulder pain with anterior lateral shoulder pain as well as occasional neck pain.  He does have right scapular and right trapezius pain without numbness or tingling.  Taking oxycodone  for pain control.  Pain keeps him up at night.  Not really much better compared with last visit.  He has suspended physical therapy due to the increased pain and the upcoming MRI that he had.              ROS: All systems reviewed are negative as they relate to the chief complaint within the history of present illness.  Patient denies fevers or chills.  Assessment & Plan: Visit Diagnoses:  1. Neck pain     Plan: Duane Cuevas is a 68 y.o. male who presents to the office for review of cervical spine MRI.  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  I think that with this as well as the trapezial/scapular nature of some of his pain, it would be worth a try cervical spine ESI and see if this is responsible for a lot of his postoperative right shoulder pain.  In the meantime, recommended that he return to physical therapy but overall his range of motion is doing well for where he is in recovery.  Follow-up after cervical spine ESI.  Follow-Up Instructions: No follow-ups on file.   Orders:  Orders Placed This Encounter  Procedures   Ambulatory referral to Physical Medicine Rehab   No orders of the  defined types were placed in this encounter.     Procedures: No procedures performed   Clinical Data: No additional findings.  Objective: Vital Signs: There were no vitals taken for this visit.  Physical Exam:  Constitutional: Patient appears well-developed HEENT:  Head: Normocephalic Eyes:EOM are normal Neck: Normal range of motion Cardiovascular: Normal rate Pulmonary/chest: Effort normal Neurologic: Patient is alert Skin: Skin is warm Psychiatric: Patient has normal mood and affect  Ortho Exam: Ortho exam demonstrates incision well-healed over the anterior aspect of the right shoulder.  There is some very minimal tenderness over the anterior aspect of the acromial process but no tenderness throughout the rest of the acromion.  Axillary nerve intact with deltoid firing.  Intact subscapularis strength.  Intact EPL, FPL, finger abduction, pronation/supination, bicep, tricep, deltoid without any weakness.  Intact shoulder shrug to the right shoulder.  2+ radial pulse of the right upper extremity.  Range of motion of the right shoulder demonstrates 30 degrees X rotation, 90 degrees abduction, 150 degrees forward elevation passively and actively.  Specialty Comments:  No specialty comments available.  Imaging: No results found.   PMFS History: Patient Active Problem List   Diagnosis Date Noted   Acute on chronic respiratory failure with hypoxia (HCC) 10/07/2023   COPD with acute exacerbation (HCC) 10/07/2023  Essential hypertension 10/07/2023   Vertigo 09/30/2023   Arthritis of right shoulder region 08/20/2023   S/P reverse total shoulder arthroplasty, right 08/08/2023   Rotator cuff arthropathy, left 08/31/2022   S/P reverse total shoulder arthroplasty, left 08/30/2022   Intractable persistent migraine aura without cerebral infarction and with status migrainosus 05/26/2021   Hypertension associated with diabetes (HCC) 09/21/2020   Uncontrolled type 2 diabetes mellitus  with hyperglycemia (HCC) 09/13/2020   CAD (coronary artery disease) 01/23/2020   HLD (hyperlipidemia) 11/02/2019   History of MI (myocardial infarction) 11/02/2019   COPD ? GOLD III/ active smoker 08/14/2018   COPD (chronic obstructive pulmonary disease) (HCC) 10/14/2014   Dyslipidemia associated with type 2 diabetes mellitus (HCC) 10/14/2014   Past Medical History:  Diagnosis Date   Cerebral aneurysm, nonruptured    followed by dr n. Nat Badger [facility] neuro[facility]);  last MRI in epic 06-13-2022 stable 2 mm left paraophthalmia ICA intracerebral aneurysm   Coronary artery disease    (pt admitted for chest pain ) nuclear stress test 11-02-2019  low risk, nuclear ef 58%,  prior inferior defect of severe severity indicative of previous MI;;   echo 04-28-2020 ef 60-65%, mild concentric LVH, RSVP 54.89mmHg;;   cardiac CTA 09-15-2020  unremarkable,  calcium  score=zero   Dependence on nocturnal oxygen  therapy    02-27-2023  per pt daughter,  pt only uses at night w/ O2 sat ,2-3L via [place],  only uses portable if going outside when every hot which is not much   Diverticulosis of colon    DOE (dyspnea on exertion)    02-27-2023  per pt daugter sob w/ stairs, incline's,  ok w/ some yard work if not warm/ hot wearther,  can do house hold chores   ED (erectile dysfunction)    Emphysema/COPD (HCC)    followed by pcp;   uses breztri  bid, uses preventil nebulizer every afternoon;   last exacerbation 06/ 2023 in epic   Full dentures    GERD (gastroesophageal reflux disease)    Hepatic steatosis    History of adenomatous polyp of colon    History of gastric ulcer    per EGD 09-16-2020  non-bleeding   History of MI (myocardial infarction)    per nuclear stress test in epic 11-02-2019 showed prior inferior defect of severe severity indicative of previous MI   History of pelvic fracture    1999  and 2009   Hyperlipidemia    Hypertension    Smokers' cough (HCC)    Type 2 diabetes mellitus treated with  insulin  (HCC)    followed by pcp;  dx 2016   (02-27-2023  per pt daughter pt   Wears glasses     Family History  Problem Relation Age of Onset   Emphysema Mother        passed away on 08-04-23   Diabetes Mother    Emphysema Father    Cancer Sister        Stomach cancer; pt says that pt had 12 sisters but the one w/ stomach cancer passed away   Diabetes Sister    Stomach cancer Sister    Diabetes Brother    Colon cancer Neg Hx    Colon polyps Neg Hx    Esophageal cancer Neg Hx    Rectal cancer Neg Hx     Past Surgical History:  Procedure Laterality Date   BICEPT TENODESIS Right 08/08/2023   Procedure: BICEPS TENODESIS;  Surgeon: Jasmine Mesi, MD;  Location: [facility];  Service: Orthopedics;  Laterality: Right;   BIOPSY  09/16/2020   Procedure: BIOPSY;  Surgeon: Lindle Rhea, MD;  Location: [facility];  Service: Gastroenterology;;   CHOLECYSTECTOMY N/A 03/20/2022   Procedure: LAPAROSCOPIC CHOLECYSTECTOMY;  Surgeon: Oralee Billow, MD;  Location: [facility];  Service: General;  Laterality: N/A;   COLONOSCOPY WITH PROPOFOL   05/10/2018   dr General Kenner   ESOPHAGOGASTRODUODENOSCOPY N/A 09/23/2015   Procedure: ESOPHAGOGASTRODUODENOSCOPY (EGD);  Surgeon: Asencion Blacksmith, MD;  Location: [facility];  Service: Endoscopy;  Laterality: N/A;   ESOPHAGOGASTRODUODENOSCOPY (EGD) WITH PROPOFOL  N/A 09/16/2020   Procedure: ESOPHAGOGASTRODUODENOSCOPY (EGD) WITH PROPOFOL ;  Surgeon: Lindle Rhea, MD;  Location: [facility];  Service: Gastroenterology;  Laterality: N/A;   EXCISION MASS NECK Left 03/09/2023   Procedure: EXCISION SOFT TISSUE MASS LEFT POSTERIOR NECK;  Surgeon: Oralee Billow, MD;  Location: [facility];  Service: General;  Laterality: Left;   FOOT SURGERY Left 1999   INGUINAL HERNIA REPAIR Left 05/15/2018   Procedure: LEFT INGUINAL HERNIA REPAIR WITH MESH;  Surgeon: Sim Dryer, MD;  Location: [facility];  Service: General;  Laterality: Left;   INSERTION OF MESH Left 05/15/2018    Procedure: INSERTION OF MESH;  Surgeon: Sim Dryer, MD;  Location: [facility];  Service: General;  Laterality: Left;   INTRAOPERATIVE CHOLANGIOGRAM N/A 03/20/2022   Procedure: INTRAOPERATIVE CHOLANGIOGRAM;  Surgeon: Oralee Billow, MD;  Location: [facility];  Service: General;  Laterality: N/A;   REVERSE SHOULDER ARTHROPLASTY Left 08/30/2022   Procedure: LEFT REVERSE SHOULDER ARTHROPLASTY;  Surgeon: Jasmine Mesi, MD;  Location: [facility];  Service: Orthopedics;  Laterality: Left;   REVERSE SHOULDER ARTHROPLASTY Right 08/08/2023   Procedure: RIGHT REVERSE SHOULDER ARTHROPLASTY;  Surgeon: Jasmine Mesi, MD;  Location: [facility] OR;  Service: Orthopedics;  Laterality: Right;   SHOULDER ARTHROSCOPY W/ ROTATOR CUFF REPAIR Left    yrs ago   Social History   Occupational History   Occupation: Employed    Employer: [employer]  Tobacco Use   Smoking status: Former    Current packs/day: 0.00    Average packs/day: 0.2 packs/day for 54.0 years (8.1 ttl pk-yrs)    Types: Cigarettes    Start date: 20    Quit date: 07/20/2023    Years since quitting: 0.2   Smokeless tobacco: Never   Tobacco comments:    02-27-2023  per pt daughter pt is still smoking, but hides how much per day,  smoking since age 74 (20)  Vaping Use   Vaping status: Never Used  Substance and Sexual Activity   Alcohol use: Not Currently    Alcohol/week: 2.0 standard drinks of alcohol    Types: 2 Cans of beer per week    Comment: 2 beers per week   Drug use: Never   Sexual activity: Yes

## 2023-10-24 ENCOUNTER — Ambulatory Visit (INDEPENDENT_AMBULATORY_CARE_PROVIDER_SITE_OTHER): Admitting: Emergency Medicine

## 2023-10-24 ENCOUNTER — Telehealth: Payer: Self-pay

## 2023-10-24 ENCOUNTER — Encounter: Payer: Self-pay | Admitting: Emergency Medicine

## 2023-10-24 VITALS — BP 110/68 | HR 95 | Temp 98.3°F | Ht 68.0 in | Wt 161.0 lb

## 2023-10-24 DIAGNOSIS — I10 Essential (primary) hypertension: Secondary | ICD-10-CM | POA: Diagnosis not present

## 2023-10-24 DIAGNOSIS — E785 Hyperlipidemia, unspecified: Secondary | ICD-10-CM

## 2023-10-24 DIAGNOSIS — E1169 Type 2 diabetes mellitus with other specified complication: Secondary | ICD-10-CM | POA: Diagnosis not present

## 2023-10-24 DIAGNOSIS — I152 Hypertension secondary to endocrine disorders: Secondary | ICD-10-CM | POA: Diagnosis not present

## 2023-10-24 DIAGNOSIS — J449 Chronic obstructive pulmonary disease, unspecified: Secondary | ICD-10-CM | POA: Diagnosis not present

## 2023-10-24 DIAGNOSIS — E1159 Type 2 diabetes mellitus with other circulatory complications: Secondary | ICD-10-CM | POA: Diagnosis not present

## 2023-10-24 DIAGNOSIS — Z09 Encounter for follow-up examination after completed treatment for conditions other than malignant neoplasm: Secondary | ICD-10-CM

## 2023-10-24 MED ORDER — GLIPIZIDE 10 MG PO TABS: ORAL_TABLET | ORAL | 3 refills | Status: AC

## 2023-10-24 MED ORDER — ALBUTEROL SULFATE (2.5 MG/3ML) 0.083% IN NEBU: 2.5000 mg | INHALATION_SOLUTION | Freq: Four times a day (QID) | RESPIRATORY_TRACT | 10 refills | Status: DC | PRN

## 2023-10-24 MED ORDER — FARXIGA 10 MG PO TABS: ORAL_TABLET | ORAL | 3 refills | Status: AC

## 2023-10-24 NOTE — Patient Instructions (Signed)
 Enfermedad pulmonar obstructiva crnica Chronic Obstructive Pulmonary Disease  La enfermedad pulmonar obstructiva crnica (EPOC) es un problema pulmonar de larga duracin (crnico). Cuando una persona tiene EPOC, puede resultarle ms difcil inhalar o exhalar. La afeccin puede empeorar con [employer]. Puede hacer algunas cosas para mantenerse lo ms sano posible. Cules son las causas? Fumar. Esta es la causa ms frecuente. Inhalar vapores, humo o sustancias qumicas durante [place]. Genes que son hereditarios, es decir que se transmiten de padres a hijos. Cules son los signos o sntomas? Falta de aire. Puede suceder [employer]. Puede empeorar al mover el cuerpo. Puede empeorar con [employer]. Es posible que haya momentos en los que empeora mucho de repente. Esos momentos se denominan "exacerbaciones". Tos prolongada, con o sin mucosidad espesa. Sibilancias. Opresin en el pecho. Cansancio. Imposibilidad de realizar [employer] Lee. Cmo se diagnostica? Esta afeccin se diagnostica en funcin de lo siguiente: Sus antecedentes mdicos. Un examen fsico. Pruebas de la funcin de los pulmones (pulmonar). Es posible que le realicen una prueba que mide el flujo de aire que sale de los pulmones al Neurosurgeon. Tambin pueden hacerle estudios, que incluyen los siguientes: Radiografa de trax. Exploracin por tomografa computarizada (TC). Anlisis de [place]. Cmo se trata? El tratamiento para esta afeccin puede incluir lo siguiente: Si fuma, dejar de hacerlo. Usar oxgeno. Usar medicamentos. Pueden incluir: Inhaladores. Estos contienen medicamentos que se inhalan. Inhaladores de uso diario. Ayudan a evitar que ocurran sntomas. Por lo general, se usan [employer]Max para prevenir las exacerbaciones de la EPOC. Inhaladores de alivio rpido. Actan rpidamente para aliviar los sntomas. Se utilizan solo cuando es necesario y proporcionan alivio a corto plazo. Otros  medicamentos que se inhalan o se tragan. Estos pueden usarse para abrir las vas respiratorias, diluir la mucosidad o tratar infecciones. Ejercicios de respiracin para ayudarle a controlar o recuperar la respiracin. Un dispositivo para eliminar la mucosidad, si tiene mucha mucosidad espesa. Rehabilitacin pulmonar. Un lugar donde aprender [employer] afeccin y las mejores formas de controlarla. [place]. Siga estas indicaciones en su casa: Medicamentos Tome los Monsanto Company se lo haya indicado el mdico. Consulte al mdico antes de tomar medicamentos para la tos o las [place]. Es posible que Water engineer los medicamentos que hagan que los pulmones se sequen. Estilo de vida [employer]state manos con agua y jabn durante al menos 20 segundos varias veces al [employer]. Utilice un desinfectante para manos si no dispone de agua y jabn. Esto puede ayudar a evitar que contraiga una infeccin. Evite estar cerca de multitudes o de personas enfermas. No fume ni consuma ningn producto que contenga nicotina o tabaco. Si necesita ayuda para dejar de consumir estos productos, consulte al mdico. Mantngase activo. Aprenda a moderar el ritmo de su actividad durante [employer]. Use mtodos de respiracin para controlar el estrs y recuperar el aliento. Beba suficiente cantidad de lquido para Radio producer pis (la [place]) de color amarillo plido, a menos que le hayan indicado lo contrario. Consuma alimentos saludables. Consuma pequeas cantidades de alimentos con mayor frecuencia. Duerma lo suficiente. La mayora de los adultos necesitan 7 horas o ms por noche. Indicaciones generales Realice un plan de accin para la EPOC junto a su mdico. Esto lo ayudar a saber qu debe hacer si se siente peor que de costumbre. Asegrese de recibir todas las vacunas que el mdico le recomiende. Consulte al [employer] vacunas para la gripe y la neumona. Si necesita oxigenoterapia en su casa, pregntele al mdico  con qu  frecuencia debe controlar su nivel de oxgeno con un dispositivo llamado "oxmetro". Concurra a todas las visitas de seguimiento para revisar su plan de accin para la EPOC. El mdico querr controlar su afeccin con frecuencia para Network engineer sano y fuera del hospital. Comunquese con un mdico si: Tose y elimina ms mucosidad que lo habitual. Hay un cambio en el color o en la consistencia de la mucosidad. Respirar le resulta ms difcil de lo habitual, o le falta el aire mientras est en reposo. Debe usar Therapist, nutritional de alivio rpido con ms frecuencia. Tiene dificultad para Xcel Energy cotidianas, como vestirse o caminar por la casa. La piel o las uas se le ponen de Edison International. Tiene fiebre o escalofros. Solicite ayuda de inmediato si: Le falta el aire y no puede hacer lo siguiente: Expresarse con oraciones completas. Hacer actividades normales. Tiene dolor en el pecho. Se siente confundido. Estos sntomas pueden Customer service manager. Llame al 911 de inmediato. No espere a ver si los sntomas desaparecen. No conduzca por sus propios medios [employer]Max Incorporated. Esta informacin no tiene Theme park manager el consejo del mdico. Asegrese de hacerle al mdico cualquier pregunta que tenga. Document Revised: 10/19/2022 Document Reviewed: 10/19/2022 Elsevier Patient Education  2024 [employer].

## 2023-10-24 NOTE — Assessment & Plan Note (Signed)
 Well-controlled hypertension.  Continue amlodipine  5 mg daily Well-controlled diabetes with hemoglobin A1c of 6.7 Diet and nutrition discussed Cardiovascular risks associated with hypertension and diabetes discussed Continue weekly Trulicity  1.5 mg, daily Farxiga  10 mg, glipizide  10 mg before breakfast, Premeal insulin  aspart as per sliding scale, and daily Tresiba  30 units

## 2023-10-24 NOTE — Telephone Encounter (Signed)
 Copied from CRM [id number]. Topic: Clinical - Medication Question >> Oct 24, 2023 10:11 AM Kita Perish H wrote: Reason for CRM: Lennice Quivers calling to clarify albuterol  (PROVENTIL ) (2.5 MG/3ML) 0.083% nebulizer solution states quantity came over as 3ml which is one vial, please reach out, thanks.  Lennice Quivers [facility] [phone]

## 2023-10-24 NOTE — Assessment & Plan Note (Signed)
 Chronic stable conditions Continue atorvastatin 20 mg daily Diet and nutrition discussed

## 2023-10-24 NOTE — Assessment & Plan Note (Signed)
 BP Readings from Last 3 Encounters:  10/24/23 110/68  10/10/23 126/86  10/03/23 114/70  Well-controlled hypertension Continue amlodipine  5 mg daily

## 2023-10-24 NOTE — Progress Notes (Signed)
 Duane Cuevas 68 y.o.   Chief Complaint  Patient presents with   Hospitalization Follow-up    Patient states he went to the ED because he had a really bad cough where it was hard for him to breath. ED old him he had pneumonia. He states states he is feeling better. Patient states he is also still having vertigo when he gets up and laying down, states the meclizine  did help for a little but not much.  He also mentions that he was not able to take his DM meds because he was out of it and called the office and no one refilled it for him and he states his sugars at home was still high after insulin     HISTORY OF PRESENT ILLNESS: This is a 68 y.o. male here for hospital discharge follow-up COPD doing much better.  Diabetes still uncontrolled at home.  Physician Discharge Summary  Woodie Degraffenreid WUJ:[id number] DOB: [birth year] DOA: 10/07/2023   PCP: Elvira Hammersmith, MD   Admit date: 10/07/2023 Discharge date: 10/10/2023   Admitted From: Home Disposition:  Home   Discharge Condition:Stable CODE STATUS:FULL Diet recommendation: Heart Healthy   Brief/Interim Summary: Patient is a 68 year old male with history of diabetes type 2, COPD, chronic hypoxic respiratory failure on 2 L of oxygen  at home, coronary artery disease, hyperlipidemia, hypertension, nonruptured cerebral aneurysm who presented with complaint of productive cough with yellow sputum, shortness of breath, left-sided chest pain, wheezing, sore throat, low-grade fever. Lab work showed WC count of 10, magnesium  of 1.6, phosphorus of 2.4, BNP of 841. COVID/flu/influenza negative. Chest x-ray showed new streaky and patchy opacity at the left base likely atelectasis but pneumonia not excluded. Patient had low-grade fever on presentation, tachycardia, stable blood pressure. Being managed for COPD exacerbation and started on antibiotics for possible community-acquired pneumonia.  Now clinically  improved.  On baseline oxygen  requirement.   Medically stable for discharge home today   Following problems were addressed during the hospitalization:     Acute COPD exacerbation: Presented with increased sputum production, cough, wheezing.  Continue bronchodilators, mucolytics,steroids.  Started on Yupelri , Brovana , continue Pulmicort .  Feels lot better today.  At baseline oxygen  requirement   Community-acquired pneumonia: Chest x-ray showed possible patchy in the left base.  Pneumonia not excluded.  Had low-grade fever at home.  Started on azithromycin , ceftriaxone .  Follow-up blood cultures,NGTD.  Procalcitonin reassuring.  Antibiotics changed to oral   Hypomagnesemia/hypophosphatemia: supplemented and  corrected   Chronic hypoxic respiratory failure: On 2 L of oxygen  at home.  Currently at the same   Diabetes type 2: Continue current insulin  regimen from home   Hyperlipidemia: On Lipitor   Hypertension: On Amlodipine    Coronary artery disease: On aspirin , calcium  channel blocker, statin   Vertigo: On meclizine    Hypophosphatemia: Supplemented and corrected   Smoker: Still smoking about 5-8 cigarettes a day.  Counseled cessation   Discharge Diagnoses:  Principal Problem:   Acute on chronic respiratory failure with hypoxia (HCC) Active Problems:   Uncontrolled type 2 diabetes mellitus with hyperglycemia (HCC)   Dyslipidemia associated with type 2 diabetes mellitus (HCC)   CAD (coronary artery disease)   Vertigo   COPD with acute exacerbation (HCC)   Essential hypertension    HPI   Prior to Admission medications   Medication Sig Start Date End Date Taking? Authorizing Provider  albuterol  (PROVENTIL ) (2.5 MG/3ML) 0.083% nebulizer solution INHALAR 1 FRASCO VIA NEBULIZADOR CADA 6 HORAS SEGUN SEA NECESARIO PARA SIBILANCIAS O FALTA DE AIRE Patient  taking differently: Take 2.5 mg by nebulization every 6 (six) hours as needed for shortness of breath or wheezing. INHALAR 1 FRASCO VIA NEBULIZADOR CADA 6 HORAS SEGUN SEA  NECESARIO PARA SIBILANCIAS O FALTA DE AIRE 12/07/22  Yes Alexi Geibel, Isidro Margo, MD  albuterol  (VENTOLIN  HFA) 108 (90 Base) MCG/ACT inhaler INHALAR 2 BOCANADAS POR VIA ORAL CADA 6 HORAS SEGUN SEA NECESARIO PARA SIBILANCIAS O FALTA DE AIRE Patient taking differently: Inhale 2 puffs into the lungs every 6 (six) hours as needed for wheezing or shortness of breath. 10/07/22  Yes Emalina Dubreuil, Isidro Margo, MD  amLODipine  (NORVASC ) 5 MG tablet TOMAR 1 TABLETA POR VAI ORAL UNA VEZ AL DIA Patient taking differently: Take 5 mg by mouth in the morning. 12/07/22  Yes SagardiaIsidro Margo, MD  aspirin  EC 81 MG tablet Take 1 tablet (81 mg total) by mouth daily. Swallow whole. Patient taking differently: Take 81 mg by mouth in the morning. Swallow whole. 09/01/22  Yes Magnant, Charles L, PA-C  atorvastatin  (LIPITOR) 20 MG tablet TOMAR 1 TABLETA POR VIA ORAL UNA VEZ AL DIA Patient taking differently: Take 20 mg by mouth in the morning. 10/07/22  Yes Brinly Maietta, Isidro Margo, MD  blood glucose meter kit and supplies KIT Dispense based on patient and insurance preference. Use up to four times daily as directed. 10/22/20  Yes Mattalyn Anderegg, Isidro Margo, MD  blood glucose meter kit and supplies Dispense based on patient and insurance preference. Use up to four times daily as directed. (FOR ICD-10 E10.9, E11.9). 10/24/20  Yes Karell Tukes, Isidro Margo, MD  Blood Glucose Monitoring Suppl (ONE TOUCH ULTRA 2) w/Device KIT Use as directed to check blood glucose up to 2 times a day 12/29/22  Yes Ravan Schlemmer, Isidro Margo, MD  Budeson-Glycopyrrol-Formoterol  (BREZTRI  AEROSPHERE) 160-9-4.8 MCG/ACT AERO INHALAR 2 BOCANADAS POR VIA ORAL DOS VECES AL DIA 06/16/23  Yes Rene Gonsoulin, Isidro Margo, MD  cyclobenzaprine  (FLEXERIL ) 10 MG tablet Take 1 tablet (10 mg total) by mouth 2 (two) times daily as needed for muscle spasms. 09/27/23  Yes Debbra Fairy, PA-C  docusate sodium  (COLACE) 100 MG capsule Take 1 capsule (100 mg total) by mouth 2 (two) times daily. 08/09/23  Yes  Magnant, Charles L, PA-C  Dulaglutide  (TRULICITY ) 1.5 MG/0.5ML SOAJ INYECTAR CONTENIDOS DE UN LAPIZ POR VIA SUBCUTANEA CADA SEMANA, EL MISMO DIA CADA SEMANA Patient taking differently: Inject 1.5 mg into the skin once a week. On Thursday 07/18/23  Yes Braxson Hollingsworth, Isidro Margo, MD  FARXIGA  10 MG TABS tablet TOMAR 1 TABLETA POR VIA ORAL UNA VEZ AL DIA Patient taking differently: Take 10 mg by mouth in the morning. 02/06/23  Yes Lenoard Helbert, Isidro Margo, MD  gabapentin  (NEURONTIN ) 300 MG capsule Take 1 capsule (300 mg total) by mouth at bedtime as needed. Patient taking differently: Take 300 mg by mouth at bedtime. 09/24/23  Yes Magnant, Charles L, PA-C  glipiZIDE  (GLUCOTROL ) 10 MG tablet TOMAR 1 TABLETA POR VIA ORAL UNA VEZ AL DIA ANTES DEL DESAYUNO *TOMAR 1 TABLETA ADICIONAL SI AZUCAR EN LA SANGRE ESTA ALTA* Patient taking differently: Take 10 mg by mouth 2 (two) times daily before a meal. TOMAR 1 TABLETA POR VIA ORAL UNA VEZ AL DIA ANTES DEL DESAYUNO *TOMAR 1 TABLETA ADICIONAL SI AZUCAR EN LA SANGRE ESTA ALTA* 09/14/22  Yes Dru Laurel, Isidro Margo, MD  ibuprofen  (ADVIL ) 800 MG tablet Take 1 tablet (800 mg total) by mouth every 8 (eight) hours as needed. 08/25/23  Yes Magnant, Justice Olp, PA-C  insulin  aspart (NOVOLOG  FLEXPEN) 100 UNIT/ML FlexPen  INYECTAR 3 UNIDADES POR VIA SUBCUTANEA TRES VECES AL DIA COMO INDICADO. AJUSTAR CANTIDAD DE INSULINA POR ESCALA MOVIL. MAS DOSIS 30 UNIDADES Patient taking differently: Inject 0-3 Units into the skin in the morning, at noon, and at bedtime. INYECTAR 3 UNIDADES POR VIA SUBCUTANEA TRES VECES AL DIA COMO INDICADO. AJUSTAR CANTIDAD DE INSULINA POR ESCALA MOVIL. MAS DOSIS 30 UNIDADES. 08/02/23  Yes Troi Bechtold, Isidro Margo, MD  Insulin  Pen Needle (B-D ULTRAFINE III SHORT PEN) 31G X 8 MM MISC USAR PARA INYECTAR INSULINA 10/13/21  Yes Fatime Biswell, Isidro Margo, MD  meclizine  (ANTIVERT ) 25 MG tablet Take 1 tablet (25 mg total) by mouth 3 (three) times daily as needed for dizziness. 09/30/23   Yes Auston Blush, MD  methocarbamol  (ROBAXIN ) 500 MG tablet Take 1 tablet (500 mg total) by mouth 2 (two) times daily. 04/14/23  Yes Lind Repine, MD  mupirocin  ointment (BACTROBAN ) 2 % Apply 1 Application topically daily. 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  Follow discharge instructions on paperwork for duration 08/09/23  Yes Magnant, Justice Olp, PA-C  ONETOUCH ULTRA test strip USE TO CHECK BLOOD SUGAR AS INSTRUCTED 01/02/23  Yes Marti Mclane, Isidro Margo, MD  pantoprazole  (PROTONIX ) 40 MG tablet TOMAR 1 TABLETA POR VIA ORAL UNA VEZ AL DIA Patient taking differently: Take 40 mg by mouth in the morning. 01/09/23  Yes Besnik Febus, Isidro Margo, MD  sildenafil  (VIAGRA ) 100 MG tablet Take 0.5-1 tablets (50-100 mg total) by mouth daily as needed for erectile dysfunction. Patient taking differently: Take 50-100 mg by mouth daily as needed for erectile dysfunction. 12/29/22  Yes Domonic Kimball, Isidro Margo, MD  TRESIBA  FLEXTOUCH 100 UNIT/ML FlexTouch Pen INYECTAR 25 UNIDADES POR VIA SUBCUTANEA DIARIAMENTE Patient taking differently: Inject 30 Units into the skin in the morning. 04/10/23  Yes Alvin Rubano, Isidro Margo, MD  Ubrogepant  (UBRELVY ) 100 MG TABS TOMAR 1 TALBETA POR VIA ORAL UNA VEZ AL DIA 07/11/22  Yes Elvira Hammersmith, MD    No Known Allergies  Patient Active Problem List   Diagnosis Date Noted   Acute on chronic respiratory failure with hypoxia (HCC) 10/07/2023   COPD with acute exacerbation (HCC) 10/07/2023   Essential hypertension 10/07/2023   Vertigo 09/30/2023   Arthritis of right shoulder region 08/20/2023   S/P reverse total shoulder arthroplasty, right 08/08/2023   Rotator cuff arthropathy, left 08/31/2022   S/P reverse total shoulder arthroplasty, left 08/30/2022   Intractable persistent migraine aura without cerebral infarction and with status migrainosus 05/26/2021   Hypertension associated with diabetes (HCC) 09/21/2020   Uncontrolled type 2 diabetes mellitus with  hyperglycemia (HCC) 09/13/2020   CAD (coronary artery disease) 01/23/2020   HLD (hyperlipidemia) 11/02/2019   History of MI (myocardial infarction) 11/02/2019   COPD ? GOLD III/ active smoker 08/14/2018   COPD (chronic obstructive pulmonary disease) (HCC) 10/14/2014   Dyslipidemia associated with type 2 diabetes mellitus (HCC) 10/14/2014    Past Medical History:  Diagnosis Date   Cerebral aneurysm, nonruptured    followed by dr n. Nat Badger [facility] neuro[facility]);  last MRI in epic 06-13-2022 stable 2 mm left paraophthalmia ICA intracerebral aneurysm   Coronary artery disease    (pt admitted for chest pain ) nuclear stress test 11-02-2019  low risk, nuclear ef 58%,  prior inferior defect of severe severity indicative of previous MI;;   echo 04-28-2020 ef 60-65%, mild concentric LVH, RSVP 54.41mmHg;;   cardiac CTA 09-15-2020  unremarkable,  calcium  score=zero   Dependence on nocturnal oxygen  therapy    02-27-2023  per  pt daughter,  pt only uses at night w/ O2 sat ,2-3L via [place],  only uses portable if going outside when every hot which is not much   Diverticulosis of colon    DOE (dyspnea on exertion)    02-27-2023  per pt daugter sob w/ stairs, incline's,  ok w/ some yard work if not warm/ hot wearther,  can do house hold chores   ED (erectile dysfunction)    Emphysema/COPD (HCC)    followed by pcp;   uses breztri  bid, uses preventil nebulizer every afternoon;   last exacerbation 06/ 2023 in epic   Full dentures    GERD (gastroesophageal reflux disease)    Hepatic steatosis    History of adenomatous polyp of colon    History of gastric ulcer    per EGD 09-16-2020  non-bleeding   History of MI (myocardial infarction)    per nuclear stress test in epic 11-02-2019 showed prior inferior defect of severe severity indicative of previous MI   History of pelvic fracture    1999  and 2009   Hyperlipidemia    Hypertension    Smokers' cough (HCC)    Type 2 diabetes mellitus treated with  insulin  (HCC)    followed by pcp;  dx 2016   (02-27-2023  per pt daughter pt   Wears glasses     Past Surgical History:  Procedure Laterality Date   BICEPT TENODESIS Right 08/08/2023   Procedure: BICEPS TENODESIS;  Surgeon: Jasmine Mesi, MD;  Location: [facility] OR;  Service: Orthopedics;  Laterality: Right;   BIOPSY  09/16/2020   Procedure: BIOPSY;  Surgeon: Lindle Rhea, MD;  Location: [facility];  Service: Gastroenterology;;   CHOLECYSTECTOMY N/A 03/20/2022   Procedure: LAPAROSCOPIC CHOLECYSTECTOMY;  Surgeon: Oralee Billow, MD;  Location: [facility] ORS;  Service: General;  Laterality: N/A;   COLONOSCOPY WITH PROPOFOL   05/10/2018   dr General Kenner   ESOPHAGOGASTRODUODENOSCOPY N/A 09/23/2015   Procedure: ESOPHAGOGASTRODUODENOSCOPY (EGD);  Surgeon: Asencion Blacksmith, MD;  Location: [facility];  Service: Endoscopy;  Laterality: N/A;   ESOPHAGOGASTRODUODENOSCOPY (EGD) WITH PROPOFOL  N/A 09/16/2020   Procedure: ESOPHAGOGASTRODUODENOSCOPY (EGD) WITH PROPOFOL ;  Surgeon: Lindle Rhea, MD;  Location: [facility];  Service: Gastroenterology;  Laterality: N/A;   EXCISION MASS NECK Left 03/09/2023   Procedure: EXCISION SOFT TISSUE MASS LEFT POSTERIOR NECK;  Surgeon: Oralee Billow, MD;  Location: [facility];  Service: General;  Laterality: Left;   FOOT SURGERY Left 1999   INGUINAL HERNIA REPAIR Left 05/15/2018   Procedure: LEFT INGUINAL HERNIA REPAIR WITH MESH;  Surgeon: Sim Dryer, MD;  Location: [facility] OR;  Service: General;  Laterality: Left;   INSERTION OF MESH Left 05/15/2018   Procedure: INSERTION OF MESH;  Surgeon: Sim Dryer, MD;  Location: [facility] OR;  Service: General;  Laterality: Left;   INTRAOPERATIVE CHOLANGIOGRAM N/A 03/20/2022   Procedure: INTRAOPERATIVE CHOLANGIOGRAM;  Surgeon: Oralee Billow, MD;  Location: [facility];  Service: General;  Laterality: N/A;   REVERSE SHOULDER ARTHROPLASTY Left 08/30/2022   Procedure: LEFT REVERSE SHOULDER ARTHROPLASTY;  Surgeon: Jasmine Mesi, MD;   Location: [facility] OR;  Service: Orthopedics;  Laterality: Left;   REVERSE SHOULDER ARTHROPLASTY Right 08/08/2023   Procedure: RIGHT REVERSE SHOULDER ARTHROPLASTY;  Surgeon: Jasmine Mesi, MD;  Location: [facility] OR;  Service: Orthopedics;  Laterality: Right;   SHOULDER ARTHROSCOPY W/ ROTATOR CUFF REPAIR Left    yrs ago    Social History   Socioeconomic History   Marital status: Married    Spouse name: Zoraida  Number of children: 5   Years of education: Not on file   Highest education level: 12th grade  Occupational History   Occupation: Employed    Employer: [employer]  Tobacco Use   Smoking status: Former    Current packs/day: 0.00    Average packs/day: 0.2 packs/day for 54.0 years (8.1 ttl pk-yrs)    Types: Cigarettes    Start date: 69    Quit date: 07/20/2023    Years since quitting: 0.2   Smokeless tobacco: Never   Tobacco comments:    02-27-2023  per pt daughter pt is still smoking, but hides how much per day,  smoking since age 69 (29)  Vaping Use   Vaping status: Never Used  Substance and Sexual Activity   Alcohol use: Not Currently    Alcohol/week: 2.0 standard drinks of alcohol    Types: 2 Cans of beer per week    Comment: 2 beers per week   Drug use: Never   Sexual activity: Yes  Other Topics Concern   Not on file  Social History Narrative   Lives with wife.  Employed full time as a Designer, fashion/clothing.  6 children.   Social Drivers of Health   Financial Resource Strain: Medium Risk (08/02/2023)   Overall Financial Resource Strain (CARDIA)    Difficulty of Paying Living Expenses: Somewhat hard  Food Insecurity: No Food Insecurity (10/11/2023)   Hunger Vital Sign    Worried About Running Out of Food in the Last Year: Never true    Ran Out of Food in the Last Year: Never true  Transportation Needs: No Transportation Needs (10/11/2023)   PRAPARE - Administrator, Civil Service (Medical): No    Lack of Transportation (Non-Medical): No  Physical Activity:  Insufficiently Active (08/02/2023)   Exercise Vital Sign    Days of Exercise per Week: 1 day    Minutes of Exercise per Session: 10 min  Stress: No Stress Concern Present (08/02/2023)   [employer] of Occupational Health - Occupational Stress Questionnaire    Feeling of Stress : Not at all  Social Connections: Socially Integrated (10/07/2023)   Social Connection and Isolation Panel [NHANES]    Frequency of Communication with Friends and Family: More than three times a week    Frequency of Social Gatherings with Friends and Family: Three times a week    Attends Religious Services: 1 to 4 times per year    Active Member of Clubs or Organizations: No    Attends Engineer, structural: More than 4 times per year    Marital Status: Married  Catering manager Violence: Not At Risk (10/11/2023)   Humiliation, Afraid, Rape, and Kick questionnaire    Fear of Current or Ex-Partner: No    Emotionally Abused: No    Physically Abused: No    Sexually Abused: No    Family History  Problem Relation Age of Onset   Emphysema Mother        passed away on 2023/08/12   Diabetes Mother    Emphysema Father    Cancer Sister        Stomach cancer; pt says that pt had 12 sisters but the one w/ stomach cancer passed away   Diabetes Sister    Stomach cancer Sister    Diabetes Brother    Colon cancer Neg Hx    Colon polyps Neg Hx    Esophageal cancer Neg Hx    Rectal cancer Neg Hx  Review of Systems  Constitutional: Negative.  Negative for chills and fever.  HENT: Negative.  Negative for congestion and sore throat.   Respiratory: Negative.  Negative for cough and shortness of breath.   Cardiovascular: Negative.  Negative for chest pain and palpitations.  Gastrointestinal:  Negative for abdominal pain, diarrhea, nausea and vomiting.  Genitourinary: Negative.  Negative for dysuria and hematuria.  Skin: Negative.  Negative for rash.  Neurological:  Negative for dizziness and headaches.   All other systems reviewed and are negative.   Vitals:   10/24/23 0921  BP: 110/68  Pulse: 95  Temp: 98.3 F (36.8 C)  SpO2: 97%    Physical Exam Vitals reviewed.  Constitutional:      Appearance: Normal appearance.  HENT:     Head: Normocephalic.     Mouth/Throat:     Mouth: Mucous membranes are moist.     Pharynx: Oropharynx is clear.  Eyes:     Extraocular Movements: Extraocular movements intact.     Conjunctiva/sclera: Conjunctivae normal.     Pupils: Pupils are equal, round, and reactive to light.  Cardiovascular:     Rate and Rhythm: Normal rate and regular rhythm.     Pulses: Normal pulses.     Heart sounds: Normal heart sounds.  Pulmonary:     Effort: Pulmonary effort is normal.     Breath sounds: Normal breath sounds.  Musculoskeletal:     Cervical back: No tenderness.  Lymphadenopathy:     Cervical: No cervical adenopathy.  Skin:    General: Skin is warm and dry.  Neurological:     Mental Status: He is alert and oriented to person, place, and time.  Psychiatric:        Mood and Affect: Mood normal.        Behavior: Behavior normal.    Lab Results  Component Value Date   HGBA1C 6.7 (A) 08/02/2023     ASSESSMENT & PLAN: A total of 44 minutes was spent with the patient and counseling/coordination of care regarding preparing for this visit, review of most recent office visit notes, review of most recent hospital discharge summary, review of multiple chronic medical conditions and their management, review of all medications, review of most recent bloodwork results, review of health maintenance items, education on nutrition, prognosis, documentation, and need for follow up.   Problem List Items Addressed This Visit       Cardiovascular and Mediastinum   Hypertension associated with diabetes (HCC)   Well-controlled hypertension.  Continue amlodipine  5 mg daily Well-controlled diabetes with hemoglobin A1c of 6.7 Diet and nutrition  discussed Cardiovascular risks associated with hypertension and diabetes discussed Continue weekly Trulicity  1.5 mg, daily Farxiga  10 mg, glipizide  10 mg before breakfast, Premeal insulin  aspart as per sliding scale, and daily Tresiba  30 units      Relevant Medications   FARXIGA  10 MG TABS tablet   glipiZIDE  (GLUCOTROL ) 10 MG tablet   Essential hypertension   BP Readings from Last 3 Encounters:  10/24/23 110/68  10/10/23 126/86  10/03/23 114/70  Well-controlled hypertension Continue amlodipine  5 mg daily         Respiratory   COPD (chronic obstructive pulmonary disease) (HCC) - Primary   Stable.  Continues to smoke.  Cardiovascular/cancer risks associated with smoking discussed.  Smoking cessation advice given Recommend starting NicoDerm patches Continues Breztri  2 inhalations twice a day and albuterol  inhaler as needed      Relevant Medications   albuterol  (PROVENTIL ) (2.5 MG/3ML) 0.083% nebulizer  solution     Endocrine   Dyslipidemia associated with type 2 diabetes mellitus (HCC)   Chronic stable conditions Continue atorvastatin  20 mg daily Diet and nutrition discussed      Relevant Medications   FARXIGA  10 MG TABS tablet   glipiZIDE  (GLUCOTROL ) 10 MG tablet   Other Visit Diagnoses       Hospital discharge follow-up          Patient Instructions  Enfermedad pulmonar obstructiva crnica Chronic Obstructive Pulmonary Disease  La enfermedad pulmonar obstructiva crnica (EPOC) es un problema pulmonar de larga duracin (crnico). Cuando una persona tiene EPOC, puede resultarle ms difcil inhalar o exhalar. La afeccin puede empeorar con [employer]. Puede hacer algunas cosas para mantenerse lo ms sano posible. Cules son las causas? Fumar. Esta es la causa ms frecuente. Inhalar vapores, humo o sustancias qumicas durante [place]. Genes que son hereditarios, es decir que se transmiten de padres a hijos. Cules son los signos o sntomas? Falta de  aire. Puede suceder [employer]. Puede empeorar al mover el cuerpo. Puede empeorar con [employer]. Es posible que haya momentos en los que empeora mucho de repente. Esos momentos se denominan "exacerbaciones". Tos prolongada, con o sin mucosidad espesa. Sibilancias. Opresin en el pecho. Cansancio. Imposibilidad de realizar [employer]. Cmo se diagnostica? Esta afeccin se diagnostica en funcin de lo siguiente: Sus antecedentes mdicos. Un examen fsico. Pruebas de la funcin de los pulmones (pulmonar). Es posible que le realicen una prueba que mide el flujo de aire que sale de los pulmones al Neurosurgeon. Tambin pueden hacerle estudios, que incluyen los siguientes: Radiografa de trax. Exploracin por tomografa computarizada (TC). Anlisis de [place]. Cmo se trata? El tratamiento para esta afeccin puede incluir lo siguiente: Si fuma, dejar de hacerlo. Usar oxgeno. Usar medicamentos. Pueden incluir: Inhaladores. Estos contienen medicamentos que se inhalan. Inhaladores de uso diario. Ayudan a evitar que ocurran sntomas. Por lo general, se usan [employer] para prevenir las exacerbaciones de la EPOC. Inhaladores de alivio rpido. Actan rpidamente para aliviar los sntomas. Se utilizan solo cuando es necesario y proporcionan alivio a corto plazo. Otros medicamentos que se inhalan o se tragan. Estos pueden usarse para abrir las vas respiratorias, diluir la mucosidad o tratar infecciones. Ejercicios de respiracin para ayudarle a controlar o recuperar la respiracin. Un dispositivo para eliminar la mucosidad, si tiene mucha mucosidad espesa. Rehabilitacin pulmonar. Un lugar donde aprender [employer] afeccin y las mejores formas de controlarla. [place]. Siga estas indicaciones en su casa: Medicamentos Tome los [employer] se lo haya indicado el mdico. Consulte al mdico antes de tomar medicamentos para la tos o las [place]. Es posible que Water engineer los  medicamentos que hagan que los pulmones se sequen. Estilo de vida [employer] manos con agua y jabn durante al menos 20 segundos varias veces al [employer]. Utilice un desinfectante para manos si no dispone de agua y jabn. Esto puede ayudar a evitar que contraiga una infeccin. Evite estar cerca de multitudes o de personas enfermas. No fume ni consuma ningn producto que contenga nicotina o tabaco. Si necesita ayuda para dejar de consumir estos productos, consulte al mdico. Mantngase activo. Aprenda a moderar el ritmo de su actividad durante [employer]. Use mtodos de respiracin para controlar el estrs y recuperar el aliento. Beba suficiente cantidad de lquido para Radio producer pis (la [place]) de color amarillo plido, a menos que le hayan indicado lo contrario. Consuma alimentos saludables. Consuma pequeas cantidades de alimentos con  mayor frecuencia. Duerma lo suficiente. La mayora de los adultos necesitan 7 horas o ms por noche. Indicaciones generales Realice un plan de accin para la EPOC junto a su mdico. Esto lo ayudar a saber qu debe hacer si se siente peor que de costumbre. Asegrese de recibir todas las vacunas que el mdico le recomiende. Consulte al [employer] vacunas para la gripe y la neumona. Si necesita oxigenoterapia en su casa, pregntele al mdico con qu frecuencia debe controlar su nivel de oxgeno con un dispositivo llamado "oxmetro". Concurra a todas las visitas de seguimiento para revisar su plan de accin para la EPOC. El mdico querr controlar su afeccin con frecuencia para Network engineer sano y fuera del hospital. Comunquese con un mdico si: Tose y elimina ms mucosidad que lo habitual. Hay un cambio en el color o en la consistencia de la mucosidad. Respirar le resulta ms difcil de lo habitual, o le falta el aire mientras est en reposo. Debe usar Therapist, nutritional de alivio rpido con ms frecuencia. Tiene dificultad para [employer] cotidianas, como  vestirse o caminar por la casa. La piel o las uas se le ponen de [employer]. Tiene fiebre o escalofros. Solicite ayuda de inmediato si: Le falta el aire y no puede hacer lo siguiente: Expresarse con oraciones completas. Hacer actividades normales. Tiene dolor en el pecho. Se siente confundido. Estos sntomas pueden Customer service manager. Llame al 911 de inmediato. No espere a ver si los sntomas desaparecen. No conduzca por sus propios medios [employer]. Esta informacin no tiene Theme park manager el consejo del mdico. Asegrese de hacerle al mdico cualquier pregunta que tenga. Document Revised: 10/19/2022 Document Reviewed: 10/19/2022 Elsevier Patient Education  2024 Elsevier Inc.    Maryagnes Small, MD [facility]

## 2023-10-24 NOTE — Assessment & Plan Note (Signed)
 Stable.  Continues to smoke.  Cardiovascular/cancer risks associated with smoking discussed.  Smoking cessation advice given Recommend starting NicoDerm patches Continues Breztri 2 inhalations twice a day and albuterol inhaler as needed

## 2023-10-25 ENCOUNTER — Ambulatory Visit: Admitting: Orthopedic Surgery

## 2023-10-26 ENCOUNTER — Other Ambulatory Visit: Payer: Self-pay | Admitting: Emergency Medicine

## 2023-10-26 ENCOUNTER — Encounter: Payer: Self-pay | Admitting: Radiology

## 2023-10-26 DIAGNOSIS — J449 Chronic obstructive pulmonary disease, unspecified: Secondary | ICD-10-CM

## 2023-10-26 MED ORDER — ATORVASTATIN CALCIUM 20 MG PO TABS: 20.0000 mg | ORAL_TABLET | Freq: Every day | ORAL | 3 refills | Status: AC

## 2023-10-26 MED ORDER — ALBUTEROL SULFATE (2.5 MG/3ML) 0.083% IN NEBU: 2.5000 mg | INHALATION_SOLUTION | Freq: Four times a day (QID) | RESPIRATORY_TRACT | 1 refills | Status: DC | PRN

## 2023-10-26 NOTE — Telephone Encounter (Signed)
 New albuterol  solution for nebulizer use prescription sent to pharmacy of record today.

## 2023-11-07 ENCOUNTER — Encounter: Payer: Self-pay | Admitting: Orthopedic Surgery

## 2023-11-07 ENCOUNTER — Other Ambulatory Visit: Payer: Self-pay | Admitting: Physical Medicine and Rehabilitation

## 2023-11-07 ENCOUNTER — Telehealth: Payer: Self-pay

## 2023-11-07 MED ORDER — DIAZEPAM 5 MG PO TABS: ORAL_TABLET | ORAL | 0 refills | Status: DC

## 2023-11-07 NOTE — Telephone Encounter (Signed)
 Please call Valium  to [facility] on [address] in [place]. For anxiety prior to injection May 27th

## 2023-11-08 ENCOUNTER — Telehealth: Payer: Self-pay | Admitting: Radiology

## 2023-11-08 NOTE — Telephone Encounter (Signed)
 Duane Cuevas (Key: [id number])---Prior Auth for diazepam  5mg  for procedure PA Case ID #: [id number] Rx #: [id number] Need Help? Call us  at [phone] Outcome Approved today by [facility] NCPDP 2017 Your request has been approved Effective Date: 07/05/2023 Authorization Expiration Date: 07/03/2024 Drug diazePAM  5MG  tablets ePA cloud logo Form [employer] Electronic PA Form [phone] NCPDP) Original Claim Info [id number] PRECERT REQ BY MD [phone] REQUIRES PRIOR AUTHORIZATION(PHARMACY HELP DESK [phone])

## 2023-11-13 ENCOUNTER — Encounter: Payer: Self-pay | Admitting: Emergency Medicine

## 2023-11-15 NOTE — Telephone Encounter (Signed)
 Place order for oxygen  as requested.  Thanks.

## 2023-11-16 ENCOUNTER — Ambulatory Visit

## 2023-11-17 ENCOUNTER — Other Ambulatory Visit: Payer: Self-pay | Admitting: Radiology

## 2023-11-17 DIAGNOSIS — J439 Emphysema, unspecified: Secondary | ICD-10-CM

## 2023-11-17 NOTE — Telephone Encounter (Signed)
 DME O2 ordered placed

## 2023-11-21 ENCOUNTER — Ambulatory Visit (INDEPENDENT_AMBULATORY_CARE_PROVIDER_SITE_OTHER): Admitting: Physical Medicine and Rehabilitation

## 2023-11-21 ENCOUNTER — Other Ambulatory Visit: Payer: Self-pay

## 2023-11-21 VITALS — BP 134/75 | HR 101

## 2023-11-21 DIAGNOSIS — M5412 Radiculopathy, cervical region: Secondary | ICD-10-CM

## 2023-11-21 MED ORDER — METHYLPREDNISOLONE ACETATE 40 MG/ML IJ SUSP
40.0000 mg | Freq: Once | INTRAMUSCULAR | Status: AC
  Administered 2023-11-21: 40 mg

## 2023-11-21 NOTE — Progress Notes (Unsigned)
 Functional Pain Scale - descriptive words and definitions  {CHL AMB ORTHO FUNCTIONAL PAIN SCALE:28673}  Average Pain 4

## 2023-11-21 NOTE — Patient Instructions (Signed)

## 2023-11-23 NOTE — Telephone Encounter (Signed)
 DME O2 has been faxed to the number provided

## 2023-11-29 NOTE — Progress Notes (Signed)
 Duane Cuevas - 68 y.o. male MRN [id number]  Date of birth: 02-03-1956  Office Visit Note: Visit Date: 11/21/2023 PCP: Elvira Hammersmith, MD Referred by: Elvira Hammersmith, *  Subjective: Chief Complaint  Patient presents with   Neck - Pain    Right C7-T1 interlaminar epidural steroid injection Pain most bothersome at night with sleep-wakes after 2 hours of sleep with neck/arm pain   HPI:  Duane Cuevas is a 68 y.o. male who comes in today at the request of Dr. Marykay Snipes for planned Right C7-T1 Cervical Interlaminar epidural steroid injection with fluoroscopic guidance.  The patient has failed conservative care including home exercise, medications, time and activity modification.  This injection will be diagnostic and hopefully therapeutic.  Please see requesting physician notes for further details and justification.    ROS Otherwise per HPI.  Assessment & Plan: Visit Diagnoses:    ICD-10-CM   1. Cervical radiculopathy  M54.12 XR C-ARM NO REPORT    Epidural Steroid injection    methylPREDNISolone  acetate (DEPO-MEDROL ) injection 40 mg      Plan: No additional findings.   Meds & Orders:  Meds ordered this encounter  Medications   methylPREDNISolone  acetate (DEPO-MEDROL ) injection 40 mg    Orders Placed This Encounter  Procedures   XR C-ARM NO REPORT   Epidural Steroid injection    Follow-up: Return for visit to requesting provider as needed.   Procedures: No procedures performed  Cervical Epidural Steroid Injection - Interlaminar Approach with Fluoroscopic Guidance  Patient: Duane Cuevas      Date of Birth: 13-Mar-1956 MRN: [id number] PCP: Elvira Hammersmith, MD      Visit Date: 11/21/2023   Universal Protocol:    Date/Time: 05/28/255:49 AM  Consent Given By: the patient  Position: PRONE  Additional Comments: Vital signs were monitored before and after the procedure. Patient was prepped and draped in the usual sterile fashion. The  correct patient, procedure, and site was verified.   Injection Procedure Details:   Procedure diagnoses: Cervical radiculopathy [M54.12]    Meds Administered:  Meds ordered this encounter  Medications   methylPREDNISolone  acetate (DEPO-MEDROL ) injection 40 mg     Laterality: Right  Location/Site: C7-T1  Needle: 3.5 in., 20 ga. Tuohy  Needle Placement: Paramedian epidural space  Findings:  -Comments: Excellent flow of contrast into the epidural space.  Procedure Details: Using a paramedian approach from the side mentioned above, the region overlying the inferior lamina was localized under fluoroscopic visualization and the soft tissues overlying this structure were infiltrated with 4 ml. of 1% Lidocaine  without Epinephrine . A # 20 gauge, Tuohy needle was inserted into the epidural space using a paramedian approach.  The epidural space was localized using loss of resistance along with contralateral oblique bi-planar fluoroscopic views.  After negative aspirate for air, blood, and CSF, a 2 ml. volume of Isovue-250 was injected into the epidural space and the flow of contrast was observed. Radiographs were obtained for documentation purposes.   The injectate was administered into the level noted above.  Additional Comments:  The patient tolerated the procedure well Dressing: 2 x 2 sterile gauze and Band-Aid    Post-procedure details: Patient was observed during the procedure. Post-procedure instructions were reviewed.  Patient left the clinic in stable condition.   Clinical History: No specialty comments available.     Objective:  VS:  HT:    WT:   BMI:     BP:134/75  HR:(!) 101bpm  TEMP: ( )  RESP:  Physical Exam Vitals and nursing note reviewed.  Constitutional:      General: He is not in acute distress.    Appearance: Normal appearance. He is not ill-appearing.  HENT:     Head: Normocephalic and atraumatic.     Right Ear: External ear normal.     Left Ear:  External ear normal.  Eyes:     Extraocular Movements: Extraocular movements intact.  Cardiovascular:     Rate and Rhythm: Normal rate.     Pulses: Normal pulses.  Abdominal:     General: There is no distension.     Palpations: Abdomen is soft.  Musculoskeletal:        General: No signs of injury.     Cervical back: Neck supple. Tenderness present. No rigidity.     Right lower leg: No edema.     Left lower leg: No edema.     Comments: Patient has good strength in the upper extremities with 5 out of 5 strength in wrist extension long finger flexion APB.  No intrinsic hand muscle atrophy.  Negative Hoffmann's test.  Lymphadenopathy:     Cervical: No cervical adenopathy.  Skin:    Findings: No erythema or rash.  Neurological:     General: No focal deficit present.     Mental Status: He is alert and oriented to person, place, and time.     Sensory: No sensory deficit.     Motor: No weakness or abnormal muscle tone.     Coordination: Coordination normal.  Psychiatric:        Mood and Affect: Mood normal.        Behavior: Behavior normal.      Imaging: No results found.

## 2023-11-29 NOTE — Procedures (Signed)
 Cervical Epidural Steroid Injection - Interlaminar Approach with Fluoroscopic Guidance  Patient: Duane Cuevas      Date of Birth: 08/13/1955 MRN: [id number] PCP: Elvira Hammersmith, MD      Visit Date: 11/21/2023   Universal Protocol:    Date/Time: 05/28/255:49 AM  Consent Given By: the patient  Position: PRONE  Additional Comments: Vital signs were monitored before and after the procedure. Patient was prepped and draped in the usual sterile fashion. The correct patient, procedure, and site was verified.   Injection Procedure Details:   Procedure diagnoses: Cervical radiculopathy [M54.12]    Meds Administered:  Meds ordered this encounter  Medications   methylPREDNISolone  acetate (DEPO-MEDROL ) injection 40 mg     Laterality: Right  Location/Site: C7-T1  Needle: 3.5 in., 20 ga. Tuohy  Needle Placement: Paramedian epidural space  Findings:  -Comments: Excellent flow of contrast into the epidural space.  Procedure Details: Using a paramedian approach from the side mentioned above, the region overlying the inferior lamina was localized under fluoroscopic visualization and the soft tissues overlying this structure were infiltrated with 4 ml. of 1% Lidocaine  without Epinephrine . A # 20 gauge, Tuohy needle was inserted into the epidural space using a paramedian approach.  The epidural space was localized using loss of resistance along with contralateral oblique bi-planar fluoroscopic views.  After negative aspirate for air, blood, and CSF, a 2 ml. volume of Isovue-250 was injected into the epidural space and the flow of contrast was observed. Radiographs were obtained for documentation purposes.   The injectate was administered into the level noted above.  Additional Comments:  The patient tolerated the procedure well Dressing: 2 x 2 sterile gauze and Band-Aid    Post-procedure details: Patient was observed during the procedure. Post-procedure instructions were  reviewed.  Patient left the clinic in stable condition.

## 2023-11-30 ENCOUNTER — Other Ambulatory Visit: Payer: Self-pay | Admitting: Radiology

## 2023-11-30 DIAGNOSIS — J431 Panlobular emphysema: Secondary | ICD-10-CM

## 2023-11-30 MED ORDER — ALBUTEROL SULFATE HFA 108 (90 BASE) MCG/ACT IN AERS: INHALATION_SPRAY | RESPIRATORY_TRACT | 10 refills | Status: DC

## 2023-12-07 ENCOUNTER — Ambulatory Visit (INDEPENDENT_AMBULATORY_CARE_PROVIDER_SITE_OTHER): Admitting: Orthopedic Surgery

## 2023-12-07 ENCOUNTER — Other Ambulatory Visit: Payer: Self-pay

## 2023-12-07 DIAGNOSIS — M5412 Radiculopathy, cervical region: Secondary | ICD-10-CM | POA: Diagnosis not present

## 2023-12-07 DIAGNOSIS — Z96612 Presence of left artificial shoulder joint: Secondary | ICD-10-CM

## 2023-12-07 DIAGNOSIS — M542 Cervicalgia: Secondary | ICD-10-CM

## 2023-12-08 ENCOUNTER — Encounter: Payer: Self-pay | Admitting: Orthopedic Surgery

## 2023-12-08 NOTE — Progress Notes (Signed)
 Office Visit Note   Patient: Duane Cuevas           Date of Birth: [birth year]           MRN: [id number] Visit Date: 12/07/2023 Requested by: Elvira Hammersmith, MD [address] [place],  [place] [address] PCP: Elvira Hammersmith, MD  Subjective: Chief Complaint  Patient presents with   Right Shoulder - Pain    HPI: Duane Cuevas is a 68 y.o. male who presents to the office reporting for follow-up after cervical spine ESI as well as his reverse shoulder replacement on the right which was done 4 months ago.  Did have a cervical spine ESI which did not help much.  Overall he states his shoulder is doing pretty well.  He describes good range of motion and strength.  ROS: All systems reviewed are negative as they relate to the chief complaint within the history of present illness.  Patient denies fevers or chills.  Assessment & Plan: Visit Diagnoses:  1. History of arthroplasty of left shoulder   2. Cervical radiculopathy     Plan: Impression is persistent cervical spine pain despite cervical spine ESI.  We will try physical therapy for the cervical spine 1-2 times a week for 6 weeks.  If he has further issues with his neck he should follow-up with Dr. Daisey Dryer or potentially Dr. Sulema Endo for surgical intervention.  Regarding the right shoulder he is doing well with that.  Both shoulders have excellent range of motion and strength..  He will follow-up as needed  Follow-Up Instructions: No follow-ups on file.   Orders:  No orders of the defined types were placed in this encounter.  No orders of the defined types were placed in this encounter.     Procedures: No procedures performed   Clinical Data: No additional findings.  Objective: Vital Signs: There were no vitals taken for this visit.  Physical Exam:  Constitutional: Patient appears well-developed HEENT:  Head: Normocephalic Eyes:EOM are normal Neck: Normal range of motion Cardiovascular: Normal  rate Pulmonary/chest: Effort normal Neurologic: Patient is alert Skin: Skin is warm Psychiatric: Patient has normal mood and affect  Ortho Exam: Ortho exam demonstrates range of motion on the right and left of 60/100/165.  Subscap strength on the right is 5- out of 5 on the left 5 out of 5.  Deltoid fires well.  Incision intact bilaterally.  Specialty Comments:  No specialty comments available.  Imaging: No results found.   PMFS History: Patient Active Problem List   Diagnosis Date Noted   COPD with acute exacerbation (HCC) 10/07/2023   Essential hypertension 10/07/2023   Arthritis of right shoulder region 08/20/2023   S/P reverse total shoulder arthroplasty, 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 GOLD III/ active smoker 08/14/2018   COPD (chronic obstructive pulmonary disease) (HCC) 10/14/2014   Dyslipidemia associated with type 2 diabetes mellitus (HCC) 10/14/2014   Past Medical History:  Diagnosis Date   Cerebral aneurysm, nonruptured    followed by dr n. Nat Badger [facility] neuro[facility]);  last MRI in epic 06-13-2022 stable 2 mm left paraophthalmia ICA  intracerebral aneurysm   Coronary artery disease    (pt admitted for chest pain ) nuclear stress test 11-02-2019  low risk, nuclear ef 58%,  prior inferior defect of severe severity indicative of previous MI;;   echo 04-28-2020 ef 60-65%, mild concentric LVH, RSVP 54.3mmHg;;   cardiac CTA 09-15-2020  unremarkable,  calcium  score=zero   Dependence on nocturnal oxygen  therapy     02-27-2023  per pt daughter,  pt only uses at night w/ O2 sat ,2-3L via [place],  only uses portable if going outside when every hot which is not much   Diverticulosis of colon    DOE (dyspnea on exertion)    02-27-2023  per pt daugter sob w/ stairs, incline's,  ok w/ some yard work if not warm/ hot wearther,  can do house hold chores   ED (erectile dysfunction)    Emphysema/COPD (HCC)    followed by pcp;   uses breztri  bid, uses preventil nebulizer every afternoon;   last exacerbation 06/ 2023 in epic   Full dentures    GERD (gastroesophageal reflux disease)    Hepatic steatosis    History of adenomatous polyp of colon    History of gastric ulcer    per EGD 09-16-2020  non-bleeding   History of MI (myocardial infarction)    per nuclear stress test in epic 11-02-2019 showed prior inferior defect of severe severity indicative of previous MI   History of pelvic fracture    1999  and 2009   Hyperlipidemia    Hypertension    Smokers' cough (HCC)    Type 2 diabetes mellitus treated with insulin  (HCC)    followed by pcp;  dx 2016   (02-27-2023  per pt daughter pt   Wears glasses     Family History  Problem Relation Age of Onset   Emphysema Mother        passed away on 08-16-2023   Diabetes Mother    Emphysema Father    Cancer Sister        Stomach cancer; pt says that pt had 12 sisters but the one w/ stomach cancer passed away   Diabetes Sister    Stomach cancer Sister    Diabetes Brother    Colon cancer Neg Hx    Colon polyps Neg Hx    Esophageal cancer Neg Hx    Rectal cancer Neg Hx     Past Surgical History:  Procedure Laterality Date   BICEPT TENODESIS Right 08/08/2023   Procedure: BICEPS TENODESIS;  Surgeon: Jasmine Mesi, MD;  Location: [facility] OR;  Service: Orthopedics;  Laterality: Right;   BIOPSY  09/16/2020   Procedure: BIOPSY;  Surgeon: Lindle Rhea, MD;  Location: [facility];  Service: Gastroenterology;;   CHOLECYSTECTOMY N/A 03/20/2022   Procedure:  LAPAROSCOPIC CHOLECYSTECTOMY;  Surgeon: Oralee Billow, MD;  Location: [facility] ORS;  Service: General;  Laterality: N/A;   COLONOSCOPY WITH PROPOFOL   05/10/2018   dr General Kenner   ESOPHAGOGASTRODUODENOSCOPY N/A 09/23/2015   Procedure: ESOPHAGOGASTRODUODENOSCOPY (EGD);  Surgeon: Asencion Blacksmith, MD;  Location: [facility];  Service: Endoscopy;  Laterality: N/A;   ESOPHAGOGASTRODUODENOSCOPY (EGD) WITH PROPOFOL  N/A 09/16/2020   Procedure: ESOPHAGOGASTRODUODENOSCOPY (EGD) WITH PROPOFOL ;  Surgeon: Lindle Rhea, MD;  Location: [facility];  Service: Gastroenterology;  Laterality: N/A;   EXCISION MASS NECK Left 03/09/2023   Procedure: EXCISION SOFT TISSUE MASS LEFT POSTERIOR NECK;  Surgeon: Oralee Billow, MD;  Location: [facility] ORS;  Service: General;  Laterality: Left;   FOOT SURGERY Left 1999  INGUINAL HERNIA REPAIR Left 05/15/2018   Procedure: LEFT INGUINAL HERNIA REPAIR WITH MESH;  Surgeon: Sim Dryer, MD;  Location: [facility] OR;  Service: General;  Laterality: Left;   INSERTION OF MESH Left 05/15/2018   Procedure: INSERTION OF MESH;  Surgeon: Sim Dryer, MD;  Location: [facility] OR;  Service: General;  Laterality: Left;   INTRAOPERATIVE CHOLANGIOGRAM N/A 03/20/2022   Procedure: INTRAOPERATIVE CHOLANGIOGRAM;  Surgeon: Oralee Billow, MD;  Location: [facility];  Service: General;  Laterality: N/A;   REVERSE SHOULDER ARTHROPLASTY Left 08/30/2022   Procedure: LEFT REVERSE SHOULDER ARTHROPLASTY;  Surgeon: Jasmine Mesi, MD;  Location: [facility] OR;  Service: Orthopedics;  Laterality: Left;   REVERSE SHOULDER ARTHROPLASTY Right 08/08/2023   Procedure: RIGHT REVERSE SHOULDER ARTHROPLASTY;  Surgeon: Jasmine Mesi, MD;  Location: [facility] OR;  Service: Orthopedics;  Laterality: Right;   SHOULDER ARTHROSCOPY W/ ROTATOR CUFF REPAIR Left    yrs ago   Social History   Occupational History   Occupation: Employed    Employer: [employer]  Tobacco Use   Smoking status: Former    Current packs/day: 0.00    Average  packs/day: 0.2 packs/day for 54.0 years (8.1 ttl pk-yrs)    Types: Cigarettes    Start date: 71    Quit date: 07/20/2023    Years since quitting: 0.3   Smokeless tobacco: Never   Tobacco comments:    02-27-2023  per pt daughter pt is still smoking, but hides how much per day,  smoking since age 47 (61)  Vaping Use   Vaping status: Never Used  Substance and Sexual Activity   Alcohol use: Not Currently    Alcohol/week: 2.0 standard drinks of alcohol    Types: 2 Cans of beer per week    Comment: 2 beers per week   Drug use: Never   Sexual activity: Yes

## 2023-12-13 ENCOUNTER — Observation Stay (HOSPITAL_COMMUNITY)
Admission: EM | Admit: 2023-12-13 | Discharge: 2023-12-16 | Disposition: A | Discharge status: Home / Self Care | Attending: Emergency Medicine | Admitting: Emergency Medicine

## 2023-12-13 ENCOUNTER — Encounter (HOSPITAL_COMMUNITY): Payer: Self-pay | Admitting: Internal Medicine

## 2023-12-13 ENCOUNTER — Emergency Department (HOSPITAL_COMMUNITY)

## 2023-12-13 ENCOUNTER — Other Ambulatory Visit (HOSPITAL_COMMUNITY)

## 2023-12-13 ENCOUNTER — Other Ambulatory Visit: Payer: Self-pay

## 2023-12-13 DIAGNOSIS — J449 Chronic obstructive pulmonary disease, unspecified: Secondary | ICD-10-CM | POA: Diagnosis present

## 2023-12-13 DIAGNOSIS — R000 Tachycardia, unspecified: Secondary | ICD-10-CM | POA: Diagnosis not present

## 2023-12-13 DIAGNOSIS — E1165 Type 2 diabetes mellitus with hyperglycemia: Secondary | ICD-10-CM | POA: Diagnosis not present

## 2023-12-13 DIAGNOSIS — R918 Other nonspecific abnormal finding of lung field: Secondary | ICD-10-CM | POA: Diagnosis not present

## 2023-12-13 DIAGNOSIS — Z7982 Long term (current) use of aspirin: Secondary | ICD-10-CM | POA: Insufficient documentation

## 2023-12-13 DIAGNOSIS — J9601 Acute respiratory failure with hypoxia: Secondary | ICD-10-CM

## 2023-12-13 DIAGNOSIS — J441 Chronic obstructive pulmonary disease with (acute) exacerbation: Secondary | ICD-10-CM | POA: Diagnosis not present

## 2023-12-13 DIAGNOSIS — J9620 Acute and chronic respiratory failure, unspecified whether with hypoxia or hypercapnia: Secondary | ICD-10-CM | POA: Diagnosis not present

## 2023-12-13 DIAGNOSIS — E785 Hyperlipidemia, unspecified: Secondary | ICD-10-CM | POA: Diagnosis present

## 2023-12-13 DIAGNOSIS — R0902 Hypoxemia: Secondary | ICD-10-CM | POA: Diagnosis not present

## 2023-12-13 DIAGNOSIS — R062 Wheezing: Secondary | ICD-10-CM | POA: Diagnosis not present

## 2023-12-13 DIAGNOSIS — Z794 Long term (current) use of insulin: Secondary | ICD-10-CM | POA: Insufficient documentation

## 2023-12-13 DIAGNOSIS — Z96612 Presence of left artificial shoulder joint: Secondary | ICD-10-CM | POA: Diagnosis not present

## 2023-12-13 DIAGNOSIS — E876 Hypokalemia: Secondary | ICD-10-CM | POA: Diagnosis not present

## 2023-12-13 DIAGNOSIS — J431 Panlobular emphysema: Secondary | ICD-10-CM

## 2023-12-13 DIAGNOSIS — Z7984 Long term (current) use of oral hypoglycemic drugs: Secondary | ICD-10-CM | POA: Insufficient documentation

## 2023-12-13 DIAGNOSIS — I10 Essential (primary) hypertension: Secondary | ICD-10-CM | POA: Diagnosis not present

## 2023-12-13 DIAGNOSIS — R0602 Shortness of breath: Secondary | ICD-10-CM | POA: Diagnosis present

## 2023-12-13 DIAGNOSIS — I2510 Atherosclerotic heart disease of native coronary artery without angina pectoris: Secondary | ICD-10-CM | POA: Diagnosis not present

## 2023-12-13 DIAGNOSIS — J439 Emphysema, unspecified: Secondary | ICD-10-CM | POA: Diagnosis not present

## 2023-12-13 DIAGNOSIS — R069 Unspecified abnormalities of breathing: Secondary | ICD-10-CM | POA: Diagnosis not present

## 2023-12-13 DIAGNOSIS — Z79899 Other long term (current) drug therapy: Secondary | ICD-10-CM | POA: Insufficient documentation

## 2023-12-13 DIAGNOSIS — Z96611 Presence of right artificial shoulder joint: Secondary | ICD-10-CM | POA: Diagnosis not present

## 2023-12-13 LAB — BASIC METABOLIC PANEL WITH GFR
Anion gap: 10 (ref 5–15)
BUN: 15 mg/dL (ref 8–23)
CO2: 23 mmol/L (ref 22–32)
Calcium: 8.1 mg/dL — ABNORMAL LOW (ref 8.9–10.3)
Chloride: 102 mmol/L (ref 98–111)
Creatinine, Ser: 0.94 mg/dL (ref 0.61–1.24)
GFR, Estimated: >60 mL/min (ref >60)
Glucose, Bld: 151 mg/dL — ABNORMAL HIGH (ref 70–99)
Potassium: 3.3 mmol/L — ABNORMAL LOW (ref 3.5–5.1)
Sodium: 135 mmol/L (ref 135–145)

## 2023-12-13 LAB — CBC
HCT: 49.3 % (ref 39.0–52.0)
Hemoglobin: 16.3 g/dL (ref 13.0–17.0)
MCH: 29.6 pg (ref 26.0–34.0)
MCHC: 33.1 g/dL (ref 30.0–36.0)
MCV: 89.6 fL (ref 80.0–100.0)
Platelets: 193 10*3/uL (ref 150–400)
RBC: 5.5 MIL/uL (ref 4.22–5.81)
RDW: 14.5 % (ref 11.5–15.5)
WBC: 8.1 10*3/uL (ref 4.0–10.5)
nRBC: 0 % (ref 0.0–0.2)

## 2023-12-13 LAB — PHOSPHORUS: Phosphorus: 2.9 mg/dL (ref 2.5–4.6)

## 2023-12-13 LAB — GLUCOSE, CAPILLARY
Glucose-Capillary: 300 mg/dL — ABNORMAL HIGH (ref 70–99)
Glucose-Capillary: 331 mg/dL — ABNORMAL HIGH (ref 70–99)
Glucose-Capillary: 258 mg/dL — ABNORMAL HIGH (ref 70–99)
Glucose-Capillary: 240 mg/dL — ABNORMAL HIGH (ref 70–99)

## 2023-12-13 LAB — BRAIN NATRIURETIC PEPTIDE: B Natriuretic Peptide: 46.4 pg/mL (ref 0.0–100.0)

## 2023-12-13 LAB — CBG MONITORING, ED: Glucose-Capillary: 213 mg/dL — ABNORMAL HIGH (ref 70–99)

## 2023-12-13 MED ORDER — INSULIN ASPART 100 UNIT/ML IJ SOLN
0.0000 [IU] | Freq: Three times a day (TID) | INTRAMUSCULAR | Status: DC
  Administered 2023-12-13: 11 [IU] via SUBCUTANEOUS
  Administered 2023-12-13 – 2023-12-14 (×2): 8 [IU] via SUBCUTANEOUS
  Administered 2023-12-14: 11 [IU] via SUBCUTANEOUS
  Administered 2023-12-14 – 2023-12-15 (×2): 3 [IU] via SUBCUTANEOUS
  Administered 2023-12-15: 5 [IU] via SUBCUTANEOUS
  Administered 2023-12-15: 8 [IU] via SUBCUTANEOUS
  Administered 2023-12-16: 3 [IU] via SUBCUTANEOUS
  Filled 2023-12-13: qty 0.15

## 2023-12-13 MED ORDER — ACETAMINOPHEN 650 MG RE SUPP: 650.0000 mg | Freq: Four times a day (QID) | RECTAL | Status: DC | PRN

## 2023-12-13 MED ORDER — AMLODIPINE BESYLATE 5 MG PO TABS
5.0000 mg | ORAL_TABLET | Freq: Every day | ORAL | Status: DC
  Administered 2023-12-13 – 2023-12-16 (×4): 5 mg via ORAL
  Filled 2023-12-13 (×4): qty 1

## 2023-12-13 MED ORDER — ONDANSETRON HCL 4 MG PO TABS
4.0000 mg | ORAL_TABLET | Freq: Four times a day (QID) | ORAL | Status: DC | PRN
Start: 2023-12-13 — End: 2023-12-16

## 2023-12-13 MED ORDER — HYDROCODONE BIT-HOMATROP MBR 5-1.5 MG/5ML PO SOLN
5.0000 mL | Freq: Four times a day (QID) | ORAL | Status: DC | PRN
  Administered 2023-12-13 – 2023-12-14 (×3): 5 mL via ORAL
  Filled 2023-12-13 (×4): qty 5

## 2023-12-13 MED ORDER — INSULIN GLARGINE-YFGN 100 UNIT/ML ~~LOC~~ SOLN
30.0000 [IU] | Freq: Every day | SUBCUTANEOUS | Status: DC
  Administered 2023-12-13 – 2023-12-16 (×4): 30 [IU] via SUBCUTANEOUS
  Filled 2023-12-13 (×4): qty 0.3

## 2023-12-13 MED ORDER — INSULIN ASPART 100 UNIT/ML IJ SOLN
3.0000 [IU] | Freq: Once | INTRAMUSCULAR | Status: AC
  Administered 2023-12-13: 3 [IU] via SUBCUTANEOUS

## 2023-12-13 MED ORDER — DM-GUAIFENESIN ER 30-600 MG PO TB12
1.0000 | ORAL_TABLET | Freq: Two times a day (BID) | ORAL | Status: DC
  Administered 2023-12-13 – 2023-12-16 (×7): 1 via ORAL
  Filled 2023-12-13 (×7): qty 1

## 2023-12-13 MED ORDER — PANTOPRAZOLE SODIUM 40 MG PO TBEC
40.0000 mg | DELAYED_RELEASE_TABLET | Freq: Every day | ORAL | Status: DC
  Administered 2023-12-13 – 2023-12-16 (×4): 40 mg via ORAL
  Filled 2023-12-13 (×4): qty 1

## 2023-12-13 MED ORDER — ASPIRIN 81 MG PO TBEC
81.0000 mg | DELAYED_RELEASE_TABLET | Freq: Every day | ORAL | Status: DC
  Administered 2023-12-13 – 2023-12-15 (×3): 81 mg via ORAL
  Filled 2023-12-13 (×4): qty 1

## 2023-12-13 MED ORDER — PREDNISONE 20 MG PO TABS
40.0000 mg | ORAL_TABLET | Freq: Every day | ORAL | Status: DC
  Administered 2023-12-13 – 2023-12-16 (×4): 40 mg via ORAL
  Filled 2023-12-13 (×4): qty 2

## 2023-12-13 MED ORDER — PANTOPRAZOLE SODIUM 40 MG PO TBEC: 40.0000 mg | DELAYED_RELEASE_TABLET | Freq: Every morning | ORAL | Status: DC

## 2023-12-13 MED ORDER — POTASSIUM CHLORIDE CRYS ER 20 MEQ PO TBCR
40.0000 meq | EXTENDED_RELEASE_TABLET | Freq: Once | ORAL | Status: AC
  Administered 2023-12-13: 40 meq via ORAL
  Filled 2023-12-13: qty 2

## 2023-12-13 MED ORDER — IPRATROPIUM-ALBUTEROL 0.5-2.5 (3) MG/3ML IN SOLN
3.0000 mL | Freq: Four times a day (QID) | RESPIRATORY_TRACT | Status: DC
  Administered 2023-12-13 – 2023-12-16 (×12): 3 mL via RESPIRATORY_TRACT
  Filled 2023-12-13 (×12): qty 3

## 2023-12-13 MED ORDER — ACETAMINOPHEN 325 MG PO TABS: 650.0000 mg | ORAL_TABLET | Freq: Four times a day (QID) | ORAL | Status: DC | PRN

## 2023-12-13 MED ORDER — ALBUTEROL SULFATE (2.5 MG/3ML) 0.083% IN NEBU
2.5000 mg | INHALATION_SOLUTION | RESPIRATORY_TRACT | Status: DC | PRN
  Administered 2023-12-13: 2.5 mg via RESPIRATORY_TRACT
  Filled 2023-12-13 (×2): qty 3

## 2023-12-13 MED ORDER — GABAPENTIN 300 MG PO CAPS
300.0000 mg | ORAL_CAPSULE | Freq: Every day | ORAL | Status: DC
  Administered 2023-12-13 – 2023-12-15 (×3): 300 mg via ORAL
  Filled 2023-12-13 (×3): qty 1

## 2023-12-13 MED ORDER — ALBUTEROL SULFATE (2.5 MG/3ML) 0.083% IN NEBU
10.0000 mg/h | INHALATION_SOLUTION | Freq: Once | RESPIRATORY_TRACT | Status: AC
  Administered 2023-12-13: 10 mg/h via RESPIRATORY_TRACT
  Filled 2023-12-13: qty 12

## 2023-12-13 MED ORDER — ATORVASTATIN CALCIUM 10 MG PO TABS
20.0000 mg | ORAL_TABLET | Freq: Every day | ORAL | Status: DC
  Administered 2023-12-13 – 2023-12-16 (×4): 20 mg via ORAL
  Filled 2023-12-13 (×4): qty 2

## 2023-12-13 MED ORDER — CYCLOBENZAPRINE HCL 10 MG PO TABS: 10.0000 mg | ORAL_TABLET | Freq: Two times a day (BID) | ORAL | Status: DC | PRN

## 2023-12-13 MED ORDER — ENOXAPARIN SODIUM 40 MG/0.4ML IJ SOSY
40.0000 mg | PREFILLED_SYRINGE | INTRAMUSCULAR | Status: DC
  Administered 2023-12-13 – 2023-12-15 (×3): 40 mg via SUBCUTANEOUS
  Filled 2023-12-13 (×3): qty 0.4

## 2023-12-13 MED ORDER — ONDANSETRON HCL 4 MG/2ML IJ SOLN: 4.0000 mg | Freq: Four times a day (QID) | INTRAMUSCULAR | Status: DC | PRN

## 2023-12-13 MED ORDER — KETOROLAC TROMETHAMINE 15 MG/ML IJ SOLN
15.0000 mg | Freq: Once | INTRAMUSCULAR | Status: AC
  Administered 2023-12-13: 15 mg via INTRAVENOUS
  Filled 2023-12-13: qty 1

## 2023-12-13 MED ORDER — IPRATROPIUM-ALBUTEROL 0.5-2.5 (3) MG/3ML IN SOLN
3.0000 mL | Freq: Once | RESPIRATORY_TRACT | Status: AC
  Administered 2023-12-13: 3 mL via RESPIRATORY_TRACT
  Filled 2023-12-13: qty 3

## 2023-12-13 MED ORDER — GLIPIZIDE 10 MG PO TABS: 10.0000 mg | ORAL_TABLET | Freq: Every day | ORAL | Status: DC

## 2023-12-13 NOTE — H&P (Signed)
 History and Physical    Patient: Duane Cuevas ZOX:[id number] DOB: [birth year] DOA: 12/13/2023 DOS: the patient was seen and examined on 12/13/2023 PCP: Elvira Hammersmith, MD  Patient coming from: Home  Chief Complaint:  Chief Complaint  Patient presents with   Shortness of Breath   HPI: Duane Cuevas is a 68 y.o. 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 He denied fever, chills, rhinorrhea, sore throat or hemoptysis. No chest pain, palpitations, diaphoresis, PND, orthopnea or pitting edema of the lower extremities.  No abdominal pain, nausea, emesis, diarrhea, constipation, melena or hematochezia.  No flank pain, dysuria, frequency or hematuria.  No polyuria, polydipsia, polyphagia or blurred vision.   Lab work: CBC was normal.  BMP showed a potassium of 3.3 mmol/L, glucose 151 and calcium  8.1 mg/dL.  The rest of the electrolytes and renal function were normal.  Imaging: 2 view chest radiograph showing emphysema and chronic bronchial thickening without evidence of acute chest disease.  Aortic atherosclerosis.   ED course: Initial vital signs were temperature 98.4 F, pulse 124, respiration 18, BP 124/65 mmHg O2 sat 96% on aerosol mask 8 L/min.  The patient received a 10 mg continuous albuterol  neb and then received a DuoNeb.  Review of Systems: As mentioned in the history of present illness. All other systems reviewed and are negative. Past Medical History:  Diagnosis Date   Cerebral aneurysm, nonruptured    followed by dr n. Nat Badger [facility]);  last MRI in epic 06-13-2022 stable 2 mm  left paraophthalmia ICA intracerebral aneurysm   Coronary artery disease    (pt admitted for chest pain ) nuclear stress test 11-02-2019  low risk, nuclear ef 58%,  prior inferior defect of severe severity indicative of previous MI;;   echo 04-28-2020 ef 60-65%, mild concentric LVH, RSVP 54.29mmHg;;   cardiac CTA 09-15-2020  unremarkable,  calcium  score=zero   Dependence on nocturnal oxygen  therapy    02-27-2023  per pt daughter,  pt only uses at night w/ O2 sat ,2-3L via Badger,  only uses portable if going outside when every hot which is not much   Diverticulosis of colon    DOE (dyspnea on exertion)    02-27-2023  per pt daugter sob w/ stairs, incline's,  ok w/ some yard work if not warm/ hot wearther,  can do house hold chores   ED (erectile dysfunction)    Emphysema/COPD (HCC)    followed by pcp;   uses breztri  bid, uses preventil nebulizer every afternoon;   last exacerbation 06/ 2023 in epic   Full dentures    GERD (gastroesophageal reflux disease)    Hepatic steatosis    History of adenomatous polyp of colon    History of gastric ulcer    per EGD 09-16-2020  non-bleeding   History of MI (myocardial infarction)    per nuclear stress test in epic 11-02-2019 showed prior inferior defect of severe severity indicative of previous MI   History of pelvic fracture    1999  and 2009   Hyperlipidemia    Hypertension    Smokers' cough (HCC)    Type 2 diabetes mellitus treated with insulin  (HCC)    followed by pcp;  dx 2016   (  02-27-2023  per pt daughter pt   Wears glasses    Past Surgical History:  Procedure Laterality Date   BICEPT TENODESIS Right 08/08/2023   Procedure: BICEPS TENODESIS;  Surgeon: Jasmine Mesi, MD;  Location[facility];  Service: Orthopedics;  Laterality: Right;   BIOPSY  09/16/2020   Procedure: BIOPSY;  Surgeon: Lindle Rhea, MD;  Location: [facility];  Service: Gastroenterology;;   CHOLECYSTECTOMY N/A 03/20/2022   Procedure: LAPAROSCOPIC CHOLECYSTECTOMY;   Surgeon: Oralee Billow, MD;  Location: [facility] ORS;  Service: General;  Laterality: N/A;   COLONOSCOPY WITH PROPOFOL   05/10/2018   dr General Kenner   ESOPHAGOGASTRODUODENOSCOPY N/A 09/23/2015   Procedure: ESOPHAGOGASTRODUODENOSCOPY (EGD);  Surgeon: Asencion Blacksmith, MD;  Location: [facility];  Service: Endoscopy;  Laterality: N/A;   ESOPHAGOGASTRODUODENOSCOPY (EGD) WITH PROPOFOL  N/A 09/16/2020   Procedure: ESOPHAGOGASTRODUODENOSCOPY (EGD) WITH PROPOFOL ;  Surgeon: Lindle Rhea, MD;  Location: [facility];  Service: Gastroenterology;  Laterality: N/A;   EXCISION MASS NECK Left 03/09/2023   Procedure: EXCISION SOFT TISSUE MASS LEFT POSTERIOR NECK;  Surgeon: Oralee Billow, MD;  Location: [facility];  Service: General;  Laterality: Left;   FOOT SURGERY Left 1999   INGUINAL HERNIA REPAIR Left 05/15/2018   Procedure: LEFT INGUINAL HERNIA REPAIR WITH MESH;  Surgeon: Sim Dryer, MD;  Location: [facility] OR;  Service: General;  Laterality: Left;   INSERTION OF MESH Left 05/15/2018   Procedure: INSERTION OF MESH;  Surgeon: Sim Dryer, MD;  Location: [facility] OR;  Service: General;  Laterality: Left;   INTRAOPERATIVE CHOLANGIOGRAM N/A 03/20/2022   Procedure: INTRAOPERATIVE CHOLANGIOGRAM;  Surgeon: Oralee Billow, MD;  Location: [facility];  Service: General;  Laterality: N/A;   REVERSE SHOULDER ARTHROPLASTY Left 08/30/2022   Procedure: LEFT REVERSE SHOULDER ARTHROPLASTY;  Surgeon: Jasmine Mesi, MD;  Location: [facility] OR;  Service: Orthopedics;  Laterality: Left;   REVERSE SHOULDER ARTHROPLASTY Right 08/08/2023   Procedure: RIGHT REVERSE SHOULDER ARTHROPLASTY;  Surgeon: Jasmine Mesi, MD;  Location[facility] OR;  Service: Orthopedics;  Laterality: Right;   SHOULDER ARTHROSCOPY W/ ROTATOR CUFF REPAIR Left    yrs ago   Social History:  reports that he quit smoking about 4 months ago. His smoking use included cigarettes. He started smoking about 54 years ago. He has a 8.1 pack-year smoking history. He has never used smokeless  tobacco. He reports that he does not currently use alcohol after a past usage of about 2.0 standard drinks of alcohol per week. He reports that he does not use drugs.  No Known Allergies  Family History  Problem Relation Age of Onset   Emphysema Mother        passed away on 10-Aug-2023   Diabetes Mother    Emphysema Father    Cancer Sister        Stomach cancer; pt says that pt had 12 sisters but the one w/ stomach cancer passed away   Diabetes Sister    Stomach cancer Sister    Diabetes Brother    Colon cancer Neg Hx    Colon polyps Neg Hx    Esophageal cancer Neg Hx    Rectal cancer Neg Hx     Prior to Admission medications   Medication Sig Start Date End Date Taking? Authorizing Provider  albuterol  (PROVENTIL ) (2.5 MG/3ML) 0.083% nebulizer solution Take 3 mLs (2.5 mg total) by nebulization every 6 (six) hours as needed for wheezing or shortness of breath. 10/26/23  Yes Sagardia, Miguel Jose, MD  albuterol  (VENTOLIN  HFA) [phone] Base) MCG/ACT inhaler  2 inhalaciones Inhalaci n Cada 6 horas PRN, sibilancias, dificultad para respirar 11/30/23  Yes Sagardia, Isidro Margo, MD  amLODipine  (NORVASC ) 5 MG tablet TOMAR 1 TABLETA POR VAI ORAL UNA VEZ AL DIA Patient taking differently: Take 5 mg by mouth in the morning. 12/07/22  Yes SagardiaIsidro Margo, MD  aspirin  EC 81 MG tablet Take 1 tablet (81 mg total) by mouth daily. Swallow whole. 09/01/22  Yes Magnant, Justice Olp, PA-C  atorvastatin  (LIPITOR) 20 MG tablet Take 1 tablet (20 mg total) by mouth daily. TOMAR 1 TABLETA POR VIA ORAL UNA VEZ AL DIA 10/26/23  Yes Sagardia, Isidro Margo, MD  Budeson-Glycopyrrol-Formoterol  (BREZTRI  AEROSPHERE) 160-9-4.8 MCG/ACT AERO INHALAR 2 BOCANADAS POR VIA ORAL DOS VECES AL DIA 06/16/23  Yes Sagardia, Isidro Margo, MD  cyclobenzaprine  (FLEXERIL ) 10 MG tablet Take 1 tablet (10 mg total) by mouth 2 (two) times daily as needed for muscle spasms. 09/27/23  Yes Debbra Fairy, PA-C  diazepam  (VALIUM ) 5 MG tablet Take one  tablet by mouth with light food one hour prior to procedure. Patient not taking: Reported on 12/13/2023 11/07/23   Darryll Eng, NP  Dulaglutide  (TRULICITY ) 1.5 MG/0.5ML SOAJ INYECTAR CONTENIDOS DE UN LAPIZ POR VIA SUBCUTANEA CADA SEMANA, EL MISMO DIA CADA [facility] Patient taking differently: Inject 1.5 mg into the skin once a week. On Thursday 07/18/23  Yes Sagardia, Isidro Margo, MD  FARXIGA  10 MG TABS tablet TOMAR 1 TABLETA POR VIA ORAL UNA VEZ AL DIA 10/24/23  Yes Sagardia, Isidro Margo, MD  gabapentin  (NEURONTIN ) 300 MG capsule Take 1 capsule (300 mg total) by mouth at bedtime as needed. Patient taking differently: Take 300 mg by mouth at bedtime. 09/24/23  Yes Magnant, Charles L, PA-C  glipiZIDE  (GLUCOTROL ) 10 MG tablet TOMAR 1 TABLETA POR VIA ORAL UNA VEZ AL DIA ANTES DEL DESAYUNO *TOMAR 1 TABLETA ADICIONAL SI AZUCAR EN LA SANGRE ESTA ALTA* 10/24/23  Yes Sagardia, Isidro Margo, MD  guaifenesin  (ROBITUSSIN) 100 MG/5ML syrup Take 200 mg by mouth 3 (three) times daily as needed for cough.   Yes [provider]  ibuprofen  (ADVIL ) 800 MG tablet Take 1 tablet (800 mg total) by mouth every 8 (eight) hours as needed. 08/25/23  Yes Magnant, Charles L, PA-C  insulin  aspart (NOVOLOG  FLEXPEN) 100 UNIT/ML FlexPen INYECTAR 3 UNIDADES POR VIA SUBCUTANEA TRES VECES AL DIA COMO INDICADO. AJUSTAR CANTIDAD DE INSULINA POR ESCALA MOVIL. MAS DOSIS 30 UNIDADES Patient taking differently: Inject 0-3 Units into the skin in the morning, at noon, and at bedtime. INYECTAR 3 UNIDADES POR VIA SUBCUTANEA TRES VECES AL DIA COMO INDICADO. AJUSTAR CANTIDAD DE INSULINA POR ESCALA MOVIL. MAS DOSIS 30 UNIDADES. 08/02/23  Yes Sagardia, Miguel Jose, MD  mupirocin  ointment (BACTROBAN ) 2 % Apply 1 Application topically daily. 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  Follow discharge instructions on paperwork for duration Patient taking differently: Apply 1 Application topically daily as needed. Apply daily to your nose  to decrease MRSA colonization.  Follow discharge instructions on paperwork for duration 08/09/23  Yes Magnant, Charles L, PA-C  pantoprazole  (PROTONIX ) 40 MG tablet TOMAR 1 TABLETA POR VIA ORAL UNA VEZ AL DIA Patient taking differently: Take 40 mg by mouth in the morning. 01/09/23  Yes Sagardia, Isidro Margo, MD  sildenafil  (VIAGRA ) 100 MG tablet Take 0.5-1 tablets (50-100 mg total) by mouth daily as needed for erectile dysfunction. 12/29/22  Yes Elvira Hammersmith, MD  TRESIBA  FLEXTOUCH 100 UNIT/ML FlexTouch Pen INYECTAR 25 UNIDADES POR VIA SUBCUTANEA DIARIAMENTE  Patient taking differently: Inject 30 Units into the skin in the morning. 04/10/23  Yes Sagardia, Isidro Margo, MD  docusate sodium  (COLACE) 100 MG capsule Take 1 capsule (100 mg total) by mouth 2 (two) times daily. Patient not taking: Reported on 12/13/2023 08/09/23   Magnant, Justice Olp, PA-C  meclizine  (ANTIVERT ) 25 MG tablet Take 1 tablet (25 mg total) by mouth 3 (three) times daily as needed for dizziness. Patient not taking: Reported on 12/13/2023 09/30/23   Auston Blush, MD  methocarbamol  (ROBAXIN ) 500 MG tablet Take 1 tablet (500 mg total) by mouth 2 (two) times daily. Patient not taking: Reported on 12/13/2023 04/14/23   Lind Repine, MD  Ubrogepant  (UBRELVY ) 100 MG TABS TOMAR 1 TALBETA POR VIA ORAL UNA VEZ AL DIA Patient not taking: Reported on 12/13/2023 07/11/22   Elvira Hammersmith, MD    Physical Exam: Vitals:   12/13/23 1610 12/13/23 0419 12/13/23 0446 12/13/23 0553  BP:    133/79  Pulse:    (!) 120  Resp:    18  Temp:    98.7 F (37.1 C)  TempSrc:      SpO2: 94%  94% 94%  Weight:  76.2 kg    Height:  5' 8 (1.727 m)     Physical Exam Vitals reviewed.  Constitutional:      General: He is awake. He is not in acute distress.    Appearance: He is ill-appearing.     Interventions: Nasal cannula in place.  HENT:     Head: Normocephalic.     Nose: No rhinorrhea.     Mouth/Throat:     Mouth: Mucous membranes are moist.   Eyes:     General: No scleral icterus.    Pupils: Pupils are equal, round, and reactive to light.  Neck:     Vascular: No JVD.  Cardiovascular:     Rate and Rhythm: Normal rate and regular rhythm.     Heart sounds: S1 normal and S2 normal.  Pulmonary:     Effort: No accessory muscle usage or respiratory distress.     Breath sounds: Decreased breath sounds, wheezing and rhonchi present. No rales.  Abdominal:     General: Bowel sounds are normal. There is no distension.     Palpations: Abdomen is soft.     Tenderness: There is no abdominal tenderness. There is no right CVA tenderness or left CVA tenderness.  Musculoskeletal:     Cervical back: Neck supple.     Right lower leg: No edema.     Left lower leg: No edema.  Skin:    General: Skin is warm and dry.  Neurological:     General: No focal deficit present.     Mental Status: He is alert and oriented to person, place, and time.  Psychiatric:        Mood and Affect: Mood normal.        Behavior: Behavior normal. Behavior is cooperative.     Data Reviewed:  Results are pending, will review when available. 04/28/2020 echocardiogram report. IMPRESSIONS:   1. Left ventricular ejection fraction, by estimation, is 60 to 65%. The  left ventricle has normal function. The left ventricle has no regional  wall motion abnormalities. There is mild concentric left ventricular  hypertrophy. Left ventricular diastolic  parameters are consistent with Grade I diastolic dysfunction (impaired  relaxation).   2. Right ventricular systolic function is normal. The right ventricular  size is normal. There is moderately elevated pulmonary artery systolic  pressure. The estimated right ventricular systolic pressure is 54.7 mmHg.   3. The mitral valve is normal in structure. No evidence of mitral valve  regurgitation. No evidence of mitral stenosis.   4. The aortic valve is normal in structure. Aortic valve regurgitation is  not visualized. No  aortic stenosis is present.   5. The inferior vena cava is dilated in size with <50% respiratory  variability, suggesting right atrial pressure of 15 mmHg.   EKG: Vent. rate 127 BPM PR interval 133 ms QRS duration 99 ms QT/QTcB 313/455 ms P-R-T axes 79 91 19 Sinus tachycardia Atrial premature complex LAE, consider biatrial enlargement Right axis deviation Anteroseptal infarct, age indeterminate  Assessment and Plan: Principal Problem:   COPD with acute exacerbation (HCC) Observation/telemetry Continue supplemental oxygen . Methylprednisolone  125 mg IVP x1 given by EMS. Followed by prednisone  40 mg p.o. daily in a.m. Scheduled and as needed bronchodilators. Follow-up CBC and chemistry in the morning.    Active Problems:   Hypokalemia Replacing.   Magnesium  was supplemented by EMS.    Hypocalcemia Recheck calcium  level in AM. Further workup depending on results.    Uncontrolled type 2 diabetes mellitus with hyperglycemia (HCC) Carbohydrate modified diet. CBG monitoring with RI SS. Check hemoglobin A1c.     Hyperlipidemia (hyperlipidemia) Continue atorvastatin  20 mg p.o. daily.     Essential hypertension  Continue amlodipine  5 mg p.o. daily.     CAD (coronary artery disease) Continue ASA, CCB and statin.     Advance Care Planning:   Code Status: Full Code   Consults:   Family Communication:   Severity of Illness: The appropriate patient status for this patient is OBSERVATION. Observation status is judged to be reasonable and necessary in order to provide the required intensity of service to ensure the patient's safety. The patient's presenting symptoms, physical exam findings, and initial radiographic and laboratory data in the context of their medical condition is felt to place them at decreased risk for further clinical deterioration. Furthermore, it is anticipated that the patient will be medically stable for discharge from the hospital within 2 midnights of  admission.   Author: Danice Dural, MD 12/13/2023 7:44 AM  For on call review [email or web address].   This document was prepared using Dragon voice recognition software and may contain some unintended transcription errors.

## 2023-12-13 NOTE — TOC Initial Note (Signed)
 Transition of Care [facility]) - Initial/Assessment Note    Patient Details  Name: Duane Cuevas MRN: [id number] Date of Birth: 1955-11-08  [facility] CM/SW Contact:    Ruben Corolla, RN Phone Number: 12/13/2023, 3:01 PM  Clinical Narrative:  Confirmed patient gets home 02 E, & D tanks from [employer]. Will have nursing to check 02 sats, & if qualifies [employer] will provide concentrator & set up if ordered.                  Expected Discharge Plan: Home/Self Care Barriers to Discharge: Continued Medical Work up   Patient Goals and CMS Choice Patient states their goals for this hospitalization and ongoing recovery are:: Home CMS Medicare.gov Compare Post Acute Care list provided to:: Patient Choice offered to / list presented to : Patient  ownership interest in [facility].provided to:: Patient    Expected Discharge Plan and Services   Discharge Planning Services: CM Consult Post Acute Care Choice: Resumption of Svcs/PTA Provider Living arrangements for the past 2 months: Single Family Home                                      Prior Living Arrangements/Services Living arrangements for the past 2 months: Single Family Home Lives with:: Spouse   Do you feel safe going back to the place where you live?: Yes          Current home services: DME (Home 02-has travel tank(unsure of dme company))    Activities of Daily Living   ADL Screening (condition at time of admission) Independently performs ADLs?: Yes (appropriate for developmental age) Is the patient deaf or have difficulty hearing?: No Does the patient have difficulty seeing, even when wearing glasses/contacts?: No Does the patient have difficulty concentrating, remembering, or making decisions?: No  Permission Sought/Granted Permission sought to share information with : Case Manager Permission granted to share information with : Yes, Verbal Permission Granted               Emotional Assessment              Admission diagnosis:  COPD exacerbation (HCC) [J44.1] Acute respiratory failure with hypoxia (HCC) [J96.01] COPD with acute exacerbation (HCC) [J44.1] Patient Active Problem List   Diagnosis Date Noted   COPD with acute exacerbation (HCC) 10/07/2023   Essential hypertension 10/07/2023   Arthritis of right shoulder region 08/20/2023   S/P reverse total shoulder arthroplasty, 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 GOLD III/ active smoker 08/14/2018   COPD (chronic obstructive pulmonary disease) (HCC) 10/14/2014   Dyslipidemia associated with type 2 diabetes mellitus (HCC) 10/14/2014   PCP:  Elvira Hammersmith, MD Pharmacy:   [facility] [address] [place], [place] - [address] [facility] [facility] [facility] [address] [place] [address] Phone: [phone] Fax: [phone]  [facility] [place]  - [place] - [address] [place]  [address] Phone: [phone] Fax: [phone]  [facility] [address] [place] [address] Phone: [phone] Fax: [phone]  [facility] [address] [place] [address] Phone: [phone] Fax: [phone]  [id number] [place], [place] - [address] AT [facility] [address] [place] [address] Phone: [phone] Fax:  [phone]     Social Drivers of Health (SDOH) Social History: SDOH Screenings   Food Insecurity: Patient Declined (12/13/2023)  Housing: Patient Declined (12/13/2023)  Transportation Needs: Patient Declined (12/13/2023)  Utilities: Patient Declined (12/13/2023)  Alcohol Screen: Low Risk  (08/02/2023)  Depression (PHQ2-9): Low Risk  (10/24/2023)  Recent Concern: Depression (PHQ2-9) - Medium Risk (08/02/2023)  Financial Resource Strain: Medium Risk (08/02/2023)  Physical Activity: Insufficiently Active (08/02/2023)  Social Connections: Patient Declined (12/13/2023)  Stress: No Stress Concern Present (08/02/2023)  Tobacco Use: Medium Risk (12/13/2023)   SDOH Interventions:     Readmission Risk Interventions    03/21/2022   12:55 PM  Readmission Risk Prevention Plan  Post Dischage Appt Complete  Medication Screening Complete  Transportation Screening Complete

## 2023-12-13 NOTE — Care Management Obs Status (Signed)
 MEDICARE OBSERVATION STATUS NOTIFICATION   Patient Details  Name: Duane Cuevas MRN: [id number] Date of Birth: [birth year]   Medicare Observation Status Notification Given:  Yes    Ruben Corolla, RN 12/13/2023, 2:59 PM

## 2023-12-13 NOTE — Progress Notes (Cosign Needed Addendum)
 SATURATION QUALIFICATIONS: (This note is used to comply with regulatory documentation for home oxygen )  Patient Saturations on Room Air at Rest = 95%  Patient Saturations on Room Air while Ambulating = 92%  PT does not need at this time.   PT uses O2 at night as needed at home. Will reassess tonight.

## 2023-12-13 NOTE — Progress Notes (Signed)
 SATURATION QUALIFICATIONS: (This note is used to comply with regulatory documentation for home oxygen )  Patient Saturations on Room Air at Rest = 95%  Patient Saturations on Room Air while Ambulating = 92%  PT does not need at this time.   PT uses O2 at night as needed at home. Will reassess tonight.

## 2023-12-13 NOTE — ED Notes (Signed)
CBG 213 

## 2023-12-13 NOTE — ED Provider Notes (Signed)
 [place] EMERGENCY DEPARTMENT AT [facility] Provider Note   CSN: [id number] Arrival date & time: 12/13/23  0409     History  Chief Complaint  Patient presents with   Shortness of Breath   Level 5 caveat due to acuity of condition Duane Cuevas is a 68 y.o. male.  The history is provided by the patient. The history is limited by the condition of the patient.  Patient with extensive history including CAD, COPD with chronic respiratory failure on oxygen , previous history of cerebral aneurysm presents with cough and shortness of breath and wheezing Patient reports over the past days had increasing cough, wheezing and shortness of breath and chest tightness.  No fevers.  No hemoptysis.  Patient is a former smoker. History limited due to acuity of condition   Past Medical History:  Diagnosis Date   Cerebral aneurysm, nonruptured    followed by dr n. Nat Badger [facility]);  last MRI in epic 06-13-2022 stable 2 mm left paraophthalmia ICA intracerebral aneurysm   Coronary artery disease    (pt admitted for chest pain ) nuclear stress test 11-02-2019  low risk, nuclear ef 58%,  prior inferior defect of severe severity indicative of previous MI;;   echo 04-28-2020 ef 60-65%, mild concentric LVH, RSVP 54.4mmHg;;   cardiac CTA 09-15-2020  unremarkable,  calcium  score=zero   Dependence on nocturnal oxygen  therapy    02-27-2023  per pt daughter,  pt only uses at night w/ O2 sat ,2-3L via [place],  only uses portable if going outside when every hot which is not much   Diverticulosis of colon    DOE (dyspnea on exertion)    02-27-2023  per pt daugter sob w/ stairs, incline's,  ok w/ some yard work if not warm/ hot wearther,  can do house hold chores   ED (erectile dysfunction)    Emphysema/COPD (HCC)    followed by pcp;   uses breztri  bid, uses preventil nebulizer every afternoon;   last exacerbation 06/ 2023 in epic   Full dentures    GERD (gastroesophageal reflux  disease)    Hepatic steatosis    History of adenomatous polyp of colon    History of gastric ulcer    per EGD 09-16-2020  non-bleeding   History of MI (myocardial infarction)    per nuclear stress test in epic 11-02-2019 showed prior inferior defect of severe severity indicative of previous MI   History of pelvic fracture    1999  and 2009   Hyperlipidemia    Hypertension    Smokers' cough (HCC)    Type 2 diabetes mellitus treated with insulin  (HCC)    followed by pcp;  dx 2016   (02-27-2023  per pt daughter pt   Wears glasses     Home Medications Prior to Admission medications   Medication Sig Start Date End Date Taking? Authorizing Provider  diazepam  (VALIUM ) 5 MG tablet Take one tablet by mouth with light food one hour prior to procedure. 11/07/23   Williams, Megan E, NP  albuterol  (PROVENTIL ) (2.5 MG/3ML) 0.083% nebulizer solution Take 3 mLs (2.5 mg total) by nebulization every 6 (six) hours as needed for wheezing or shortness of breath. 10/26/23   Sagardia, Miguel Jose, MD  albuterol  (VENTOLIN  HFA) [phone] Base) MCG/ACT inhaler 2 inhalaciones Inhalaci n Cada 6 horas PRN, sibilancias, dificultad para respirar 11/30/23   Elvira Hammersmith, MD  amLODipine  (NORVASC ) 5 MG tablet TOMAR 1 TABLETA POR VAI ORAL UNA VEZ AL DIA Patient  taking differently: Take 5 mg by mouth in the morning. 12/07/22   Sagardia, Miguel Jose, MD  aspirin  EC 81 MG tablet Take 1 tablet (81 mg total) by mouth daily. Swallow whole. Patient taking differently: Take 81 mg by mouth in the morning. Swallow whole. 09/01/22   Magnant, Justice Olp, PA-C  atorvastatin  (LIPITOR) 20 MG tablet Take 1 tablet (20 mg total) by mouth daily. TOMAR 1 TABLETA POR VIA ORAL UNA VEZ AL DIA 10/26/23   Elvira Hammersmith, MD  Budeson-Glycopyrrol-Formoterol  (BREZTRI  AEROSPHERE) 160-9-4.8 MCG/ACT AERO INHALAR 2 BOCANADAS POR VIA ORAL DOS VECES AL DIA 06/16/23   Sagardia, Isidro Margo, MD  cyclobenzaprine  (FLEXERIL ) 10 MG tablet Take 1 tablet (10  mg total) by mouth 2 (two) times daily as needed for muscle spasms. 09/27/23   Debbra Fairy, PA-C  docusate sodium  (COLACE) 100 MG capsule Take 1 capsule (100 mg total) by mouth 2 (two) times daily. 08/09/23   Magnant, Charles L, PA-C  Dulaglutide  (TRULICITY ) 1.5 MG/0.5ML SOAJ INYECTAR CONTENIDOS DE UN LAPIZ POR VIA SUBCUTANEA CADA SEMANA, EL MISMO DIA CADA SEMANA Patient taking differently: Inject 1.5 mg into the skin once a week. On Thursday 07/18/23   Elvira Hammersmith, MD  FARXIGA  10 MG TABS tablet TOMAR 1 TABLETA POR VIA ORAL UNA VEZ AL DIA 10/24/23   Elvira Hammersmith, MD  gabapentin  (NEURONTIN ) 300 MG capsule Take 1 capsule (300 mg total) by mouth at bedtime as needed. Patient taking differently: Take 300 mg by mouth at bedtime. 09/24/23   Magnant, Charles L, PA-C  glipiZIDE  (GLUCOTROL ) 10 MG tablet TOMAR 1 TABLETA POR VIA ORAL UNA VEZ AL DIA ANTES DEL DESAYUNO *TOMAR 1 TABLETA ADICIONAL SI AZUCAR EN LA SANGRE ESTA ALTA* 10/24/23   Elvira Hammersmith, MD  ibuprofen  (ADVIL ) 800 MG tablet Take 1 tablet (800 mg total) by mouth every 8 (eight) hours as needed. 08/25/23   Magnant, Charles L, PA-C  insulin  aspart (NOVOLOG  FLEXPEN) 100 UNIT/ML FlexPen INYECTAR 3 UNIDADES POR VIA SUBCUTANEA TRES VECES AL DIA COMO INDICADO. AJUSTAR CANTIDAD DE INSULINA POR ESCALA MOVIL. MAS DOSIS 30 UNIDADES Patient taking differently: Inject 0-3 Units into the skin in the morning, at noon, and at bedtime. INYECTAR 3 UNIDADES POR VIA SUBCUTANEA TRES VECES AL DIA COMO INDICADO. AJUSTAR CANTIDAD DE INSULINA POR ESCALA MOVIL. MAS DOSIS 30 UNIDADES. 08/02/23   Sagardia, Miguel Jose, MD  meclizine  (ANTIVERT ) 25 MG tablet Take 1 tablet (25 mg total) by mouth 3 (three) times daily as needed for dizziness. 09/30/23   Auston Blush, MD  methocarbamol  (ROBAXIN ) 500 MG tablet Take 1 tablet (500 mg total) by mouth 2 (two) times daily. 04/14/23   Lind Repine, MD  mupirocin  ointment (BACTROBAN ) 2 % Apply 1 Application topically  daily. 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  Follow discharge instructions on paperwork for duration 08/09/23   Magnant, Justice Olp, PA-C  pantoprazole  (PROTONIX ) 40 MG tablet TOMAR 1 TABLETA POR VIA ORAL UNA VEZ AL DIA Patient taking differently: Take 40 mg by mouth in the morning. 01/09/23   Sagardia, Miguel Jose, MD  sildenafil  (VIAGRA ) 100 MG tablet Take 0.5-1 tablets (50-100 mg total) by mouth daily as needed for erectile dysfunction. Patient taking differently: Take 50-100 mg by mouth daily as needed for erectile dysfunction. 12/29/22   Sagardia, Miguel Jose, MD  TRESIBA  FLEXTOUCH 100 UNIT/ML FlexTouch Pen INYECTAR 25 UNIDADES POR VIA SUBCUTANEA DIARIAMENTE Patient taking differently: Inject 30 Units into the skin in the morning. 04/10/23  Sagardia, Miguel Jose, MD  Ubrogepant  (UBRELVY ) 100 MG TABS TOMAR 1 TALBETA POR VIA ORAL UNA VEZ AL DIA 07/11/22   Elvira Hammersmith, MD      Allergies    Patient has no known allergies.    Review of Systems   Review of Systems  Unable to perform ROS: Acuity of condition    Physical Exam Updated Vital Signs BP 133/79   Pulse (!) 120   Temp 98.7 F (37.1 C)   Resp 18   Ht 1.727 m (5' 8)   Wt 76.2 kg   SpO2 94%   BMI 25.54 kg/m  Physical Exam CONSTITUTIONAL: Elderly, ill-appearing HEAD: Normocephalic/atraumatic EYES: EOMI/PERRL ENMT: Mucous membranes moist NECK: supple no meningeal signs, no JVD CV: Tachycardic LUNGS: Tachypneic and wheezing bilaterally ABDOMEN: soft NEURO: Pt is awake/alert/appropriate, moves all extremitiesx4.  No facial droop.   EXTREMITIES: pulses normal/equal, full ROM SKIN: warm, color normal PSYCH: Mildly anxious  ED Results / Procedures / Treatments   Labs (all labs ordered are listed, but only abnormal results are displayed) Labs Reviewed  BASIC METABOLIC PANEL WITH GFR - Abnormal; Notable for the following components:      Result Value   Potassium 3.3 (*)    Glucose, Bld 151 (*)     Calcium  8.1 (*)    All other components within normal limits  CBC    EKG EKG Interpretation Date/Time:  Wednesday December 13 2023 04:17:49 EDT Ventricular Rate:  127 PR Interval:  133 QRS Duration:  99 QT Interval:  313 QTC Calculation: 455 R Axis:   91  Text Interpretation: Sinus tachycardia Atrial premature complex LAE, consider biatrial enlargement Right axis deviation Anteroseptal infarct, age indeterminate Confirmed by Eldon Greenland ([id number]) on 12/13/2023 4:21:56 AM  Radiology DG Chest 2 View Result Date: 12/13/2023 CLINICAL DATA:  Shortness of breath, onset 2 days ago. Difficulty breathing. EXAM: CHEST - 2 VIEW COMPARISON:  Portable chest 10/07/2023 FINDINGS: The lungs are mildly emphysematous with chronic central bronchial thickening without evidence of focal pneumonia. No pleural effusion is seen. The cardiomediastinal silhouette and vasculature are normal except for calcifications in the transverse aorta. 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 No acute osseous findings. IMPRESSION: 1. Emphysema and chronic bronchial thickening without evidence of acute chest disease. 2. Aortic atherosclerosis. Electronically Signed   By: Denman Fischer M.D.   On: 12/13/2023 04:52    Procedures .Critical Care  Performed by: Eldon Greenland, MD Authorized by: Eldon Greenland, MD   Critical care provider statement:    Critical care time (minutes):  40   Critical care start time:  12/13/2023 4:40 AM   Critical care end time:  12/13/2023 5:20 AM   Critical care time was exclusive of:  Separately billable procedures and treating other patients   Critical care was necessary to treat or prevent imminent or life-threatening deterioration of the following conditions:  Respiratory failure   Critical care was time spent personally by me on the following activities:  Obtaining history from patient or surrogate, examination of patient, pulse oximetry, ordering and review of radiographic  studies, ordering and review of laboratory studies, ordering and performing treatments and interventions, re-evaluation of patient's condition, development of treatment plan with patient or surrogate, evaluation of patient's response to treatment and review of old charts   I assumed direction of critical care for this patient from another provider in my specialty: no       Medications Ordered in ED Medications  albuterol  (PROVENTIL ) (2.5 MG/3ML) 0.083% nebulizer  solution (10 mg/hr Nebulization Given 12/13/23 0445)  ipratropium-albuterol  (DUONEB) 0.5-2.5 (3) MG/3ML nebulizer solution 3 mL (3 mLs Nebulization Given 12/13/23 1610)    ED Course/ Medical Decision Making/ A&P Clinical Course as of 12/13/23 0705  Wed Dec 13, 2023  0449 Patient with known history of COPD presents with increasing cough, wheezing and shortness of breath.  EMS gave patient nebulizer treatments as well as Solu-Medrol .  Will give an additional continuous albuterol  here.  Will follow closely [DW]  0627 Patient still with wheezing bilaterally, tachypneic and increased oxygen  requirement.  He is currently on over 3 L nasal cannula which is above his baseline, he is tachycardic and still wheezing.  Patient will need to be admitted for further management.  No signs of pneumonia or heart failure on x-ray.  Patient agreeable with plan [DW]  0705 Discussed with Dr. Segars for admission [DW]    Clinical Course User Index [DW] Eldon Greenland, MD                                 Medical Decision Making Amount and/or Complexity of Data Reviewed Labs: ordered. Radiology: ordered.  Risk Prescription drug management. Decision regarding hospitalization.   This patient presents to the ED for concern of shortness of breath, this involves an extensive number of treatment options, and is a complaint that carries with it a high risk of complications and morbidity.  The differential diagnosis includes but is not limited to Acute  coronary syndrome, pneumonia, acute pulmonary edema, pneumothorax, acute anemia, pulmonary embolism COPD exacerbation  Comorbidities that complicate the patient evaluation: Patient's presentation is complicated by their history of COPD, CAD  Social Determinants of Health: Patient's tobacco use history, English as a second language  increases the complexity of managing their presentation  Additional history obtained: Records reviewed previous admission documents  Lab Tests: I Ordered, and personally interpreted labs.  The pertinent results include: Mild hyperglycemia  Imaging Studies ordered: I ordered imaging studies including X-ray chest  I independently visualized and interpreted imaging which showed no acute findings I agree with the radiologist interpretation  Cardiac Monitoring: The patient was maintained on a cardiac monitor.  I personally viewed and interpreted the cardiac monitor which showed an underlying rhythm of:  sinus tachycardia  Medicines ordered and prescription drug management: I ordered medication including albuterol  for wheezing Reevaluation of the patient after these medicines showed that the patient    stayed the same  Critical Interventions:   nebulizer therapy, admission  Consultations Obtained: I requested consultation with the admitting physician [facility]  Dr. Segars, and discussed  findings as well as pertinent plan - they recommend: Admit  Reevaluation: After the interventions noted above, I reevaluated the patient and found that they have :stayed the same  Complexity of problems addressed: Patient's presentation is most consistent with  acute presentation with potential threat to life or bodily function  Disposition: After consideration of the diagnostic results and the patient's response to treatment,  I feel that the patent would benefit from admission  .           Final Clinical Impression(s) / ED Diagnoses Final diagnoses:  COPD  exacerbation (HCC)  Acute respiratory failure with hypoxia [facility])    Rx / DC Orders ED Discharge Orders     None         Eldon Greenland, MD 12/13/23 [phone]

## 2023-12-13 NOTE — ED Triage Notes (Signed)
 Pt BIB EMS coming from home c/o of shob that started on Monday. It worsen yesterday at night. Today at 2am woke up having hard time breathing. Painful when coughing, feels chest tight. Hx: Copd,  vertigo  EMS:  BP 135/89, HR 130s, 90% RA  EMS 20 left forearm, 5mg  atrovent , 0.5 atrovent , 2g Mag, solumedrol given en route

## 2023-12-13 NOTE — Progress Notes (Signed)
 SATURATION QUALIFICATIONS: (This note is used to comply with regulatory documentation for home oxygen )  Patient Saturations on Room Air at Rest = 94%  Patient Saturations on Room Air while Ambulating = 87%  Patient Saturations on 2 Liters of oxygen  while Ambulating = 98%  Please briefly explain why patient needs home oxygen : PT dropped down to 87% on RA, while ambulating in the hall.

## 2023-12-13 NOTE — TOC Initial Note (Signed)
 Transition of Care [facility]) - Initial/Assessment Note    Patient Details  Name: Duane Cuevas MRN: [id number] Date of Birth: [birth year]  Transition of Care [facility]) CM/SW Contact:    Ruben Corolla, RN Phone Number: 12/13/2023, 2:30 PM  Clinical Narrative:   d/c plan home.               Expected Discharge Plan: Home/Self Care Barriers to Discharge: Continued Medical Work up   Patient Goals and CMS Choice Patient states their goals for this hospitalization and ongoing recovery are:: Home CMS Medicare.gov Compare Post Acute Care list provided to:: Patient Represenative (must comment) ([name](dtr)) Choice offered to / list presented to : Spouse [place] ownership interest in [facility].provided to:: Adult Children    Expected Discharge Plan and Services     Post Acute Care Choice: Resumption of Svcs/PTA Provider Living arrangements for the past 2 months: Single Family Home                                      Prior Living Arrangements/Services Living arrangements for the past 2 months: Single Family Home Lives with:: Spouse   Do you feel safe going back to the place where you live?: Yes               Activities of Daily Living   ADL Screening (condition at time of admission) Independently performs ADLs?: Yes (appropriate for developmental age) Is the patient deaf or have difficulty hearing?: No Does the patient have difficulty seeing, even when wearing glasses/contacts?: No Does the patient have difficulty concentrating, remembering, or making decisions?: No  Permission Sought/Granted Permission sought to share information with : Case Manager Permission granted to share information with : Yes, Verbal Permission Granted              Emotional Assessment              Admission diagnosis:  COPD exacerbation (HCC) [J44.1] Acute respiratory failure with hypoxia (HCC) [J96.01] COPD with acute exacerbation (HCC) [J44.1] Patient Active  Problem List   Diagnosis Date Noted   COPD with acute exacerbation (HCC) 10/07/2023   Essential hypertension 10/07/2023   Arthritis of right shoulder region 08/20/2023   S/P reverse total shoulder arthroplasty, 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 GOLD III/ active smoker 08/14/2018   COPD (chronic obstructive pulmonary disease) (HCC) 10/14/2014   Dyslipidemia associated with type 2 diabetes mellitus (HCC) 10/14/2014   PCP:  Elvira Hammersmith, MD Pharmacy:   [facility] [address] [place], [place] - [address] [facility] AT [address] [place]. [place] [address] Phone: [phone] Fax: [phone]  [facility] [place]  - [place], [place] - [address] [place] [address] Phone: [phone] Fax: [phone]  [facility] [address] [place] [address] Phone: [phone] Fax: [phone]  [facility] [place] - [facility] [address] [facility] [place]  [place] [address] Phone: [phone] Fax: [phone]  [id number] - [place], [place] - [address] AT [facility] [address] [place] [address] Phone: [phone] Fax: [phone]     Social Drivers of Health (SDOH) Social History: SDOH Screenings   Food Insecurity: Patient Declined (12/13/2023)  Housing: Patient Declined (12/13/2023)  Transportation Needs: Patient Declined (12/13/2023)   Utilities: Patient Declined (12/13/2023)  Alcohol Screen: Low Risk  (08/02/2023)  Depression (PHQ2-9): Low Risk  (10/24/2023)  Recent Concern: Depression (PHQ2-9) - Medium Risk (08/02/2023)  Financial Resource Strain: Medium Risk (08/02/2023)  Physical Activity: Insufficiently Active (08/02/2023)  Social Connections: Patient Declined (12/13/2023)  Stress: No Stress Concern Present (08/02/2023)  Tobacco Use: Medium Risk (12/13/2023)   SDOH Interventions:     Readmission Risk Interventions    03/21/2022   12:55 PM  Readmission Risk Prevention Plan  Post Dischage Appt Complete  Medication Screening Complete  Transportation Screening Complete

## 2023-12-14 ENCOUNTER — Observation Stay (HOSPITAL_COMMUNITY)

## 2023-12-14 DIAGNOSIS — R9431 Abnormal electrocardiogram [ECG] [EKG]: Secondary | ICD-10-CM | POA: Diagnosis not present

## 2023-12-14 DIAGNOSIS — J441 Chronic obstructive pulmonary disease with (acute) exacerbation: Secondary | ICD-10-CM | POA: Diagnosis not present

## 2023-12-14 LAB — COMPREHENSIVE METABOLIC PANEL WITH GFR
ALT: 27 U/L (ref 0–44)
AST: 22 U/L (ref 15–41)
Albumin: 3.6 g/dL (ref 3.5–5.0)
Alkaline Phosphatase: 86 U/L (ref 38–126)
Anion gap: 9 (ref 5–15)
BUN: 28 mg/dL — ABNORMAL HIGH (ref 8–23)
CO2: 23 mmol/L (ref 22–32)
Calcium: 8.9 mg/dL (ref 8.9–10.3)
Chloride: 103 mmol/L (ref 98–111)
Creatinine, Ser: 0.88 mg/dL (ref 0.61–1.24)
GFR, Estimated: >60 mL/min (ref >60)
Glucose, Bld: 175 mg/dL — ABNORMAL HIGH (ref 70–99)
Potassium: 4.3 mmol/L (ref 3.5–5.1)
Sodium: 135 mmol/L (ref 135–145)
Total Bilirubin: 1 mg/dL (ref 0.0–1.2)
Total Protein: 7.3 g/dL (ref 6.5–8.1)

## 2023-12-14 LAB — GLUCOSE, CAPILLARY
Glucose-Capillary: 184 mg/dL — ABNORMAL HIGH (ref 70–99)
Glucose-Capillary: 282 mg/dL — ABNORMAL HIGH (ref 70–99)
Glucose-Capillary: 336 mg/dL — ABNORMAL HIGH (ref 70–99)
Glucose-Capillary: 314 mg/dL — ABNORMAL HIGH (ref 70–99)

## 2023-12-14 LAB — ECHOCARDIOGRAM COMPLETE
AR max vel: 2.47 cm2
AV Area VTI: 2.45 cm2
AV Area mean vel: 1.98 cm2
AV Mean grad: 4 mmHg
AV Peak grad: 8.3 mmHg
Ao pk vel: 1.44 m/s
Area-P 1/2: 6.88 cm2
Calc EF: 65.5 %
Height: 68 in
S' Lateral: 3 cm
Single Plane A2C EF: 71.3 %
Single Plane A4C EF: 61.8 %
Weight: 2646.4 [oz_av]

## 2023-12-14 LAB — RESPIRATORY PANEL BY PCR

## 2023-12-14 LAB — CBC
HCT: 47.6 % (ref 39.0–52.0)
Hemoglobin: 15.8 g/dL (ref 13.0–17.0)
MCH: 30.1 pg (ref 26.0–34.0)
MCHC: 33.2 g/dL (ref 30.0–36.0)
MCV: 90.7 fL (ref 80.0–100.0)
Platelets: 197 10*3/uL (ref 150–400)
RBC: 5.25 MIL/uL (ref 4.22–5.81)
RDW: 14.4 % (ref 11.5–15.5)
WBC: 8.6 10*3/uL (ref 4.0–10.5)
nRBC: 0 % (ref 0.0–0.2)

## 2023-12-14 LAB — HEMOGLOBIN A1C
Hgb A1c MFr Bld: 7.2 % — ABNORMAL HIGH (ref 4.8–5.6)
Mean Plasma Glucose: 159.94 mg/dL

## 2023-12-14 LAB — SARS CORONAVIRUS 2 BY RT PCR: SARS Coronavirus 2 by RT PCR: NEGATIVE

## 2023-12-14 MED ORDER — INSULIN ASPART 100 UNIT/ML IJ SOLN
6.0000 [IU] | Freq: Once | INTRAMUSCULAR | Status: AC
  Administered 2023-12-14: 6 [IU] via SUBCUTANEOUS

## 2023-12-14 MED ORDER — AZITHROMYCIN 500 MG PO TABS
500.0000 mg | ORAL_TABLET | Freq: Every day | ORAL | Status: DC
  Administered 2023-12-14 – 2023-12-16 (×3): 500 mg via ORAL
  Filled 2023-12-14 (×3): qty 1

## 2023-12-14 NOTE — Progress Notes (Addendum)
 SATURATION QUALIFICATIONS: (This note is used to comply with regulatory documentation for home oxygen )  Patient Saturations on Room Air at Rest = 94%  Patient Saturations on Room Air while Ambulating = 87%  Patient Saturations on 2 Liters of oxygen  while Ambulating = 98%  Please briefly explain why patient needs home oxygen : Patient O2 drops to 87% with ambulation.

## 2023-12-14 NOTE — Progress Notes (Signed)
*  PRELIMINARY RESULTS* Echocardiogram 2D Echocardiogram has been performed.  Duane Cuevas 12/14/2023, 9:39 AM

## 2023-12-14 NOTE — TOC Progression Note (Addendum)
 Transition of Care [facility]) - Progression Note    Patient Details  Name: Duane Cuevas MRN: [id number] Date of Birth: Jan 28, 1956  [facility]) CM/SW Contact  Firas Guardado, Thersia Flax, RN Phone Number: 12/14/2023, 12:33 PM  Clinical Narrative:  Noted qualifes for home 02;order placed-Already using [employer] rep Zach aware of dme order home 02-they will need auth prior delivery-await auth for home 02-to deliver travel tank to rm prior d/c if auth. Has own transport home.     Expected Discharge Plan: Home/Self Care Barriers to Discharge: Continued Medical Work up  Expected Discharge Plan and Services   Discharge Planning Services: CM Consult Post Acute Care Choice: Resumption of Svcs/PTA Provider Living arrangements for the past 2 months: Single Family Home                                       Social Determinants of Health (SDOH) Interventions SDOH Screenings   Food Insecurity: Patient Declined (12/13/2023)  Housing: Patient Declined (12/13/2023)  Transportation Needs: Patient Declined (12/13/2023)  Utilities: Patient Declined (12/13/2023)  Alcohol Screen: Low Risk  (08/02/2023)  Depression (PHQ2-9): Low Risk  (10/24/2023)  Recent Concern: Depression (PHQ2-9) - Medium Risk (08/02/2023)  Financial Resource Strain: Medium Risk (08/02/2023)  Physical Activity: Insufficiently Active (08/02/2023)  Social Connections: Patient Declined (12/13/2023)  Stress: No Stress Concern Present (08/02/2023)  Tobacco Use: Medium Risk (12/13/2023)    Readmission Risk Interventions    03/21/2022   12:55 PM  Readmission Risk Prevention Plan  Post Dischage Appt Complete  Medication Screening Complete  Transportation Screening Complete

## 2023-12-14 NOTE — Plan of Care (Signed)

## 2023-12-14 NOTE — TOC CM/SW Note (Signed)
    Durable Medical Equipment  (From admission, onward)           Start     Ordered   12/14/23 1205  For home use only DME oxygen   Once       Question Answer Comment  Length of Need 6 Months   Mode or (Route) Nasal cannula   Liters per Minute 2   Frequency Continuous (stationary and portable oxygen  unit needed)   Oxygen  delivery system Gas      12/14/23 1204

## 2023-12-14 NOTE — Progress Notes (Signed)
 PROGRESS NOTE    Larenz Frasier  ZOX:[id number] DOB: 04-Feb-1956 DOA: 12/13/2023 PCP: Elvira Hammersmith, MD     Brief Narrative:  Demarkus Remmel is a 68 y.o. 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  X-ray revealed emphysema and chronic bronchial thickening without evidence of acute chest disease.  Aortic atherosclerosis.  Patient admitted for COPD exacerbation.   New events last 24 hours / Subjective: Continues to have some shortness of breath, especially after having coughing episodes.  Assessment & Plan:   Principal Problem:   COPD with acute exacerbation (HCC) Active Problems:   Uncontrolled type 2 diabetes mellitus with hyperglycemia (HCC)   CAD (coronary artery disease)   Hypokalemia   HLD (hyperlipidemia)   Essential hypertension   Hypocalcemia   COPD exacerbation - Continue azithromycin , prednisone , breathing treatments - Check COVID, flu, RSV, respiratory viral panel  Acute on chronic hypoxic respiratory failure - Uses 2 L nasal cannula O2 as needed at baseline.  Currently requiring 2 to 3 L.  Wean as able.  Diabetes mellitus type 2 - A1c ordered - Semglee , sliding scale insulin   Hyperlipidemia - Lipitor  Hypertension - Amlodipine   CAD - Aspirin   DVT prophylaxis:  enoxaparin  (LOVENOX ) injection 40 mg Start: 12/13/23 2200  Code Status: Full code Family Communication: None at bedside Disposition Plan: Home Status is: Observation The patient will require care spanning > 2 midnights and should be moved to inpatient because: Remains symptomatic with dyspnea at  rest    Antimicrobials:  Anti-infectives (From admission, onward)    Start     Dose/Rate Route Frequency Ordered Stop   12/14/23 1000  azithromycin  (ZITHROMAX ) tablet 500 mg        500 mg Oral Daily 12/14/23 0721 12/19/23 0959        Objective: Vitals:   12/14/23 0513 12/14/23 0831 12/14/23 1140 12/14/23 1156  BP: 117/79  117/85   Pulse: 98  90   Resp: 18     Temp: 97.8 F (36.6 C)  97.9 F (36.6 C)   TempSrc: Oral  Oral   SpO2: 96% 98% 97% 97%  Weight:      Height:        Intake/Output Summary (Last 24 hours) at 12/14/2023 1225 Last data filed at 12/14/2023 0805 Gross per 24 hour  Intake 340 ml  Output --  Net 340 ml   Filed Weights   12/13/23 0419 12/13/23 1201  Weight: 76.2 kg 75 kg    Examination:  General exam: Appears calm and comfortable  Respiratory system: Diminished breath sounds bilaterally, mildly tachypneic at rest, some shortness of breath with conversation Cardiovascular system: S1 & S2 heard, RRR. No murmurs. No pedal edema. Gastrointestinal system: Abdomen is nondistended, soft and nontender. Normal bowel sounds heard. Central nervous system: Alert and oriented. No focal neurological deficits. Speech clear.  Extremities: Symmetric in appearance  Skin: No rashes, lesions or ulcers on exposed skin  Psychiatry: Judgement and insight appear normal. Mood & affect appropriate.   Data Reviewed: I have personally reviewed following labs and imaging studies  CBC: Recent Labs  Lab 12/13/23 0501 12/14/23 0516  WBC 8.1 8.6  HGB 16.3 15.8  HCT 49.3 47.6  MCV 89.6 90.7  PLT  193 197   Basic Metabolic Panel: Recent Labs  Lab 12/13/23 0501 12/13/23 1328 12/14/23 0516  NA 135  --  135  K 3.3*  --  4.3  CL 102  --  103  CO2 23  --  23  GLUCOSE 151*  --  175*  BUN 15  --  28*  CREATININE 0.94  --  0.88  CALCIUM  8.1*  --  8.9  PHOS  --  2.9  --    GFR: Estimated Creatinine Clearance: 77.7 mL/min (by C-G formula based on SCr of 0.88  mg/dL). Liver Function Tests: Recent Labs  Lab 12/14/23 0516  AST 22  ALT 27  ALKPHOS 86  BILITOT 1.0  PROT 7.3  ALBUMIN  3.6   No results for input(s): LIPASE, AMYLASE in the last 168 hours. No results for input(s): AMMONIA in the last 168 hours. Coagulation Profile: No results for input(s): INR, PROTIME in the last 168 hours. Cardiac Enzymes: No results for input(s): CKTOTAL, CKMB, CKMBINDEX, TROPONINI in the last 168 hours. BNP (last 3 results) No results for input(s): PROBNP in the last 8760 hours. HbA1C: No results for input(s): HGBA1C in the last 72 hours. CBG: Recent Labs  Lab 12/13/23 1608 12/13/23 2052 12/13/23 2212 12/14/23 0734 12/14/23 1053  GLUCAP 331* 258* 240* 184* 282*   Lipid Profile: No results for input(s): CHOL, HDL, LDLCALC, TRIG, CHOLHDL, LDLDIRECT in the last 72 hours. Thyroid Function Tests: No results for input(s): TSH, T4TOTAL, FREET4, T3FREE, THYROIDAB in the last 72 hours. Anemia Panel: No results for input(s): VITAMINB12, FOLATE, FERRITIN, TIBC, IRON, RETICCTPCT in the last 72 hours. Sepsis Labs: No results for input(s): PROCALCITON, LATICACIDVEN in the last 168 hours.  No results found for this or any previous visit (from the past 240 hours).    Radiology Studies: DG Chest 2 View Result Date: 12/13/2023 CLINICAL DATA:  Shortness of breath, onset 2 days ago. Difficulty breathing. EXAM: CHEST - 2 VIEW COMPARISON:  Portable chest 10/07/2023 FINDINGS: The lungs are mildly emphysematous with chronic central bronchial thickening without evidence of focal pneumonia. No pleural effusion is seen. The cardiomediastinal silhouette and vasculature are normal except for calcifications in the transverse aorta. 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 No acute osseous findings. IMPRESSION: 1. Emphysema and chronic bronchial thickening without evidence of acute chest disease. 2. Aortic  atherosclerosis. Electronically Signed   By: Denman Fischer M.D.   On: 12/13/2023 04:52      Scheduled Meds:  amLODipine   5 mg Oral Daily   aspirin  EC  81 mg Oral Daily   atorvastatin   20 mg Oral Daily   azithromycin   500 mg Oral Daily   dextromethorphan -guaiFENesin   1 tablet Oral BID   enoxaparin  (LOVENOX ) injection  40 mg Subcutaneous Q24H   gabapentin   300 mg Oral QHS   insulin  aspart  0-15 Units Subcutaneous TID WC   insulin  glargine-yfgn  30 Units Subcutaneous Daily   ipratropium-albuterol   3 mL Nebulization QID   pantoprazole   40 mg Oral Daily   predniSONE   40 mg Oral Q breakfast   Continuous Infusions:   LOS: 0 days   Time spent: 35 minutes   Daren Eck, DO [facility] 12/14/2023, 12:25 PM   Available via Epic secure chat 7am-7pm After these hours, please refer to coverage provider listed on [email or web address]

## 2023-12-15 DIAGNOSIS — J441 Chronic obstructive pulmonary disease with (acute) exacerbation: Secondary | ICD-10-CM | POA: Diagnosis not present

## 2023-12-15 LAB — GLUCOSE, CAPILLARY
Glucose-Capillary: 198 mg/dL — ABNORMAL HIGH (ref 70–99)
Glucose-Capillary: 209 mg/dL — ABNORMAL HIGH (ref 70–99)
Glucose-Capillary: 291 mg/dL — ABNORMAL HIGH (ref 70–99)
Glucose-Capillary: 326 mg/dL — ABNORMAL HIGH (ref 70–99)

## 2023-12-15 MED ORDER — INSULIN ASPART 100 UNIT/ML IJ SOLN
3.0000 [IU] | Freq: Three times a day (TID) | INTRAMUSCULAR | Status: DC
  Administered 2023-12-15 – 2023-12-16 (×4): 3 [IU] via SUBCUTANEOUS

## 2023-12-15 NOTE — Progress Notes (Signed)
 PROGRESS NOTE    Duane Cuevas  MVH:[id number] DOB: Apr 20, 1956 DOA: 12/13/2023 PCP: Elvira Hammersmith, MD     Brief Narrative:  Duane Cuevas is a 68 y.o. 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  X-ray revealed emphysema and chronic bronchial thickening without evidence of acute chest disease.  Aortic atherosclerosis.  Patient admitted for COPD exacerbation.   New events last 24 hours / Subjective: Feeling slightly better but continues to have significant cough.  Assessment & Plan:   Principal Problem:   COPD with acute exacerbation (HCC) Active Problems:   Uncontrolled type 2 diabetes mellitus with hyperglycemia (HCC)   CAD (coronary artery disease)   Hypokalemia   HLD (hyperlipidemia)   Essential hypertension   Hypocalcemia   COPD exacerbation in setting of metapneumovirus - Continue azithromycin , prednisone , breathing treatments - Respiratory viral panel positive for metapneumovirus  Acute on chronic hypoxic respiratory failure - Uses 2 L nasal cannula O2 as needed at baseline.  Currently requiring 2 to 3 L.  Wean as able.  Diabetes mellitus type 2 - A1c 7.2 - Semglee , sliding scale insulin .  Added mealtime NovoLog  due to hyperglycemia with prednisone  use  Hyperlipidemia - Lipitor  Hypertension - Amlodipine   CAD - Aspirin   DVT prophylaxis:  enoxaparin  (LOVENOX ) injection 40 mg Start: 12/13/23 2200  Code Status: Full code Family Communication: None at bedside Disposition Plan: Home Status is: Observation The patient will require care spanning > 2 midnights and  should be moved to inpatient because: Remains symptomatic with dyspnea at rest, requiring oxygen     Antimicrobials:  Anti-infectives (From admission, onward)    Start     Dose/Rate Route Frequency Ordered Stop   12/14/23 1000  azithromycin  (ZITHROMAX ) tablet 500 mg        500 mg Oral Daily 12/14/23 0721 12/19/23 0959        Objective: Vitals:   12/14/23 1534 12/14/23 2028 12/15/23 0431 12/15/23 0735  BP:  126/88 119/87   Pulse:  94 87   Resp:  18 16   Temp:  98 F (36.7 C) 98.1 F (36.7 C)   TempSrc:  Oral Oral   SpO2: 97% 99% 98% 96%  Weight:      Height:        Intake/Output Summary (Last 24 hours) at 12/15/2023 1120 Last data filed at 12/15/2023 1000 Gross per 24 hour  Intake 120 ml  Output --  Net 120 ml   Filed Weights   12/13/23 0419 12/13/23 1201  Weight: 76.2 kg 75 kg    Examination:  General exam: Appears calm and comfortable  Respiratory system: Diminished breath sounds bilaterally, mildly tachypneic at rest, wheezes bilateral upper lobes, on nasal cannula O2 Cardiovascular system: S1 & S2 heard, RRR. No murmurs. No pedal edema. Gastrointestinal system: Abdomen is nondistended, soft and nontender. Normal bowel sounds heard. Central nervous system: Alert and oriented. No focal neurological deficits. Speech clear.  Extremities: Symmetric in appearance  Skin: No rashes, lesions or ulcers on exposed skin  Psychiatry: Judgement and insight appear normal. Mood & affect appropriate.   Data Reviewed: I have personally reviewed following labs and imaging studies  CBC: Recent Labs  Lab 12/13/23 0501 12/14/23 0516  WBC 8.1 8.6  HGB 16.3 15.8  HCT 49.3 47.6  MCV 89.6 90.7  PLT 193 197   Basic Metabolic Panel: Recent Labs  Lab 12/13/23 0501 12/13/23 1328 12/14/23 0516  NA 135  --  135  K 3.3*  --  4.3  CL 102  --  103  CO2 23  --  23  GLUCOSE 151*  --  175*  BUN 15  --  28*  CREATININE 0.94  --  0.88  CALCIUM  8.1*  --  8.9  PHOS  --  2.9  --     GFR: Estimated Creatinine Clearance: 77.7 mL/min (by C-G formula based on SCr of 0.88 mg/dL). Liver Function Tests: Recent Labs  Lab 12/14/23 0516  AST 22  ALT 27  ALKPHOS 86  BILITOT 1.0  PROT 7.3  ALBUMIN  3.6   No results for input(s): LIPASE, AMYLASE in the last 168 hours. No results for input(s): AMMONIA in the last 168 hours. Coagulation Profile: No results for input(s): INR, PROTIME in the last 168 hours. Cardiac Enzymes: No results for input(s): CKTOTAL, CKMB, CKMBINDEX, TROPONINI in the last 168 hours. BNP (last 3 results) No results for input(s): PROBNP in the last 8760 hours. HbA1C: Recent Labs    12/14/23 1249  HGBA1C 7.2*   CBG: Recent Labs  Lab 12/14/23 0734 12/14/23 1053 12/14/23 1631 12/14/23 2026 12/15/23 0733  GLUCAP 184* 282* 336* 314* 198*   Lipid Profile: No results for input(s): CHOL, HDL, LDLCALC, TRIG, CHOLHDL, LDLDIRECT in the last 72 hours. Thyroid Function Tests: No results for input(s): TSH, T4TOTAL, FREET4, T3FREE, THYROIDAB in the last 72 hours. Anemia Panel: No results for input(s): VITAMINB12, FOLATE, FERRITIN, TIBC, IRON, RETICCTPCT in the last 72 hours. Sepsis Labs: No results for input(s): PROCALCITON, LATICACIDVEN in the last 168 hours.  Recent Results (from the past 240 hours)  SARS Coronavirus 2 by RT PCR (hospital order, performed in [facility] hospital lab) *cepheid single result test* Anterior Nasal Swab     Status: None   Collection Time: 12/14/23 12:58 PM   Specimen: Anterior Nasal Swab  Result Value Ref Range Status   SARS Coronavirus 2 by RT PCR NEGATIVE NEGATIVE Final    Comment: (NOTE) SARS-CoV-2 target nucleic acids are NOT DETECTED.  The SARS-CoV-2 RNA is generally detectable in upper and lower respiratory specimens during the acute phase of infection. The lowest concentration of SARS-CoV-2 viral copies this assay can detect is 250 copies / mL. A  negative result does not preclude SARS-CoV-2 infection and should not be used as the sole basis for treatment or other patient management decisions.  A negative result may occur with improper specimen collection / handling, submission of specimen other than nasopharyngeal swab, presence of viral mutation(s) within the areas targeted by this assay, and inadequate number of viral copies (<250 copies / mL). A negative result must be combined with clinical observations, patient history, and epidemiological information.  Fact Sheet for Patients:   RoadLapTop.co.za  Fact Sheet for Healthcare Providers: http://kim-miller.com/  This test is not yet approved or  cleared by the [place]  FDA and has been authorized for detection and/or diagnosis of SARS-CoV-2 by FDA under an Emergency Use Authorization (EUA).  This EUA will remain in effect (meaning this test can be used) for the duration of the COVID-19 declaration under Section 564(b)(1) of the Act, 21 U.S.C. section 360bbb-3(b)(1), unless the authorization is terminated or revoked sooner.  Performed at [facility], [address], [place], [place] [address]  Respiratory (~20 pathogens) panel by PCR     Status: Abnormal   Collection Time: 12/14/23 12:58 PM   Specimen: Nasopharyngeal Swab; Respiratory  Result Value Ref Range Status   Adenovirus NOT DETECTED NOT DETECTED Final   Coronavirus 229E NOT DETECTED NOT DETECTED Final    Comment: (NOTE) The Coronavirus on the Respiratory Panel, DOES NOT test for the novel  Coronavirus (2019 nCoV)    Coronavirus HKU1 NOT DETECTED NOT DETECTED Final   Coronavirus NL63 NOT DETECTED NOT DETECTED Final   Coronavirus OC43 NOT DETECTED NOT DETECTED Final   Metapneumovirus DETECTED (A) NOT DETECTED Final   Rhinovirus / Enterovirus NOT DETECTED NOT DETECTED Final   Influenza A NOT DETECTED NOT DETECTED Final   Influenza B NOT  DETECTED NOT DETECTED Final   Parainfluenza Virus 1 NOT DETECTED NOT DETECTED Final   Parainfluenza Virus 2 NOT DETECTED NOT DETECTED Final   Parainfluenza Virus 3 NOT DETECTED NOT DETECTED Final   Parainfluenza Virus 4 NOT DETECTED NOT DETECTED Final   Respiratory Syncytial Virus NOT DETECTED NOT DETECTED Final   Bordetella pertussis NOT DETECTED NOT DETECTED Final   Bordetella Parapertussis NOT DETECTED NOT DETECTED Final   Chlamydophila pneumoniae NOT DETECTED NOT DETECTED Final   Mycoplasma pneumoniae NOT DETECTED NOT DETECTED Final    Comment: Performed at [facility], [address], [place], [place] [address]      Radiology Studies: ECHOCARDIOGRAM COMPLETE Result Date: 12/14/2023    ECHOCARDIOGRAM REPORT   Patient Name:   GORDY GOAR Date of Exam: 12/14/2023 Medical Rec #:  [id number]         Height:       68.0 in Accession #:    [id number]        Weight:       165.4 lb Date of Birth:  1955-11-28         BSA:          1.885 m Patient Age:    68 years          BP:           117/79 mmHg Patient Gender: M                 HR:           103 bpm. Exam Location:  Inpatient Procedure: 2D Echo, Cardiac Doppler and Color Doppler (Both Spectral and Color            Flow Doppler were utilized during procedure). Indications:    R94.31 Abnormal EKG  History:        Patient has prior history of Echocardiogram examinations, most                 recent 04/28/2020.  Sonographer:    Andrena Bang Referring Phys: [id number] DAVID MANUEL ORTIZ IMPRESSIONS  1. Left ventricular ejection fraction, by estimation, is 65 to 70%. The left ventricle has normal function. The left ventricle has no regional wall motion abnormalities. Left ventricular diastolic parameters were normal.  2. Right ventricular systolic function is normal. The right ventricular size is normal. There is moderately elevated pulmonary artery systolic pressure. The estimated right ventricular systolic pressure is 59.9 mmHg.  3. The mitral valve  is normal in structure. No evidence of mitral valve regurgitation. No evidence of mitral stenosis.  4. The aortic valve is tricuspid. There is mild calcification of the aortic valve. Aortic valve regurgitation is not visualized. Aortic valve sclerosis is present, with no evidence of  aortic valve stenosis.  5. The inferior vena cava is dilated in size with <50% respiratory variability, suggesting right atrial pressure of 15 mmHg. Comparison(s): No significant change from prior study. RVSP 59.9 on current study, prior in 2021 was 54.7 mmHg. Conclusion(s)/Recommendation(s): Normal biventricular function, elevated right sided pressures as noted, no significant valve disease. FINDINGS  Left Ventricle: Left ventricular ejection fraction, by estimation, is 65 to 70%. The left ventricle has normal function. The left ventricle has no regional wall motion abnormalities. The left ventricular internal cavity size was normal in size. There is  no left ventricular hypertrophy. Left ventricular diastolic parameters were normal. Right Ventricle: The right ventricular size is normal. No increase in right ventricular wall thickness. Right ventricular systolic function is normal. There is moderately elevated pulmonary artery systolic pressure. The tricuspid regurgitant velocity is 3.35 m/s, and with an assumed right atrial pressure of 15 mmHg, the estimated right ventricular systolic pressure is 59.9 mmHg. Left Atrium: Left atrial size was normal in size. Right Atrium: Right atrial size was not well visualized. Pericardium: There is no evidence of pericardial effusion. Mitral Valve: The mitral valve is normal in structure. No evidence of mitral valve regurgitation. No evidence of mitral valve stenosis. Tricuspid Valve: The tricuspid valve is normal in structure. Tricuspid valve regurgitation is mild . No evidence of tricuspid stenosis. Aortic Valve: The aortic valve is tricuspid. There is mild calcification of the aortic valve. Aortic  valve regurgitation is not visualized. Aortic valve sclerosis is present, with no evidence of aortic valve stenosis. Aortic valve mean gradient measures 4.0 mmHg. Aortic valve peak gradient measures 8.3 mmHg. Aortic valve area, by VTI measures 2.45 cm. Pulmonic Valve: The pulmonic valve was not well visualized. Pulmonic valve regurgitation is not visualized. No evidence of pulmonic stenosis. Aorta: The aortic root, ascending aorta, aortic arch and descending aorta are all structurally normal, with no evidence of dilitation or obstruction. Venous: The inferior vena cava is dilated in size with less than 50% respiratory variability, suggesting right atrial pressure of 15 mmHg. IAS/Shunts: The interatrial septum was not well visualized.  LEFT VENTRICLE PLAX 2D LVIDd:         4.70 cm     Diastology LVIDs:         3.00 cm     LV e' lateral:   13.40 cm/s LV PW:         1.00 cm     LV E/e' lateral: 8.8 LV IVS:        1.00 cm LVOT diam:     1.80 cm LV SV:         75 LV SV Index:   40 LVOT Area:     2.54 cm  LV Volumes (MOD) LV vol d, MOD A2C: 76.2 ml LV vol d, MOD A4C: 64.2 ml LV vol s, MOD A2C: 21.9 ml LV vol s, MOD A4C: 24.5 ml LV SV MOD A2C:     54.3 ml LV SV MOD A4C:     64.2 ml LV SV MOD BP:      46.2 ml RIGHT VENTRICLE RV S prime:     13.20 cm/s TAPSE (M-mode): 2.3 cm LEFT ATRIUM             Index LA diam:        3.70 cm 1.96 cm/m LA Vol (A2C):   29.7 ml 15.75 ml/m LA Vol (A4C):   26.5 ml 14.06 ml/m LA Biplane Vol: 28.5 ml 15.12 ml/m  AORTIC VALVE AV  Area (Vmax):    2.47 cm AV Area (Vmean):   1.98 cm AV Area (VTI):     2.45 cm AV Vmax:           144.00 cm/s AV Vmean:          98.400 cm/s AV VTI:            0.307 m AV Peak Grad:      8.3 mmHg AV Mean Grad:      4.0 mmHg LVOT Vmax:         140.00 cm/s LVOT Vmean:        76.600 cm/s LVOT VTI:          0.296 m LVOT/AV VTI ratio: 0.96  AORTA Ao Asc diam: 3.60 cm MITRAL VALVE                TRICUSPID VALVE MV Area (PHT): 6.88 cm     TR Peak grad:   44.9 mmHg MV E  velocity: 118.00 cm/s  TR Vmax:        335.00 cm/s MV A velocity: 135.00 cm/s MV E/A ratio:  0.87         SHUNTS                             Systemic VTI:  0.30 m                             Systemic Diam: 1.80 cm Sheryle Donning MD Electronically signed by Sheryle Donning MD Signature Date/Time: 12/14/2023/12:55:47 PM    Final       Scheduled Meds:  amLODipine   5 mg Oral Daily   aspirin  EC  81 mg Oral Daily   atorvastatin   20 mg Oral Daily   azithromycin   500 mg Oral Daily   dextromethorphan -guaiFENesin   1 tablet Oral BID   enoxaparin  (LOVENOX ) injection  40 mg Subcutaneous Q24H   gabapentin   300 mg Oral QHS   insulin  aspart  0-15 Units Subcutaneous TID WC   insulin  aspart  3 Units Subcutaneous TID WC   insulin  glargine-yfgn  30 Units Subcutaneous Daily   ipratropium-albuterol   3 mL Nebulization QID   pantoprazole   40 mg Oral Daily   predniSONE   40 mg Oral Q breakfast   Continuous Infusions:   LOS: 0 days   Time spent: 20 minutes   Daren Eck, DO [facility] 12/15/2023, 11:20 AM   Available via Epic secure chat 7am-7pm After these hours, please refer to coverage provider listed on [email or web address]

## 2023-12-15 NOTE — Progress Notes (Signed)
 Mobility Specialist - Progress Note  Pre-mobility: 95% SpO2 ([place] 3L) During mobility: 117 bpm HR, 94% SpO2 ([place] 3L) During mobility: 120 bpm HR, 93% SpO2 ([place] 2L) Post-mobility: 109 BPM HR, 93% SPO2 ([place] 3L)   12/15/23 1212  Mobility  Activity Ambulated independently in hallway  Level of Assistance Independent  Assistive Device None  Distance Ambulated (ft) 600 ft  Range of Motion/Exercises Active  Activity Response Tolerated well  Mobility Referral Yes  Mobility visit 1 Mobility  Mobility Specialist Start Time (ACUTE ONLY) 1200  Mobility Specialist Stop Time (ACUTE ONLY) 1212  Mobility Specialist Time Calculation (min) (ACUTE ONLY) 12 min   Pt was found in bed and agreeable to ambulate. Non-productive cough during ambulation. At EOS returned to sit EOB with all needs met. Call bell in reach and RN notified.  Lorna Rose,  Mobility Specialist Can be reached via Secure Chat

## 2023-12-15 NOTE — Plan of Care (Signed)
  Problem: Education: Goal: Knowledge of disease or condition will improve Outcome: Progressing   Problem: Activity: Goal: Ability to tolerate increased activity will improve Outcome: Progressing   Problem: Respiratory: Goal: Ability to maintain a clear airway will improve Outcome: Progressing   

## 2023-12-16 ENCOUNTER — Other Ambulatory Visit (HOSPITAL_COMMUNITY): Payer: Self-pay

## 2023-12-16 DIAGNOSIS — J441 Chronic obstructive pulmonary disease with (acute) exacerbation: Secondary | ICD-10-CM | POA: Diagnosis not present

## 2023-12-16 LAB — MAGNESIUM: Magnesium: 2.2 mg/dL (ref 1.7–2.4)

## 2023-12-16 LAB — BASIC METABOLIC PANEL WITH GFR
Anion gap: 8 (ref 5–15)
BUN: 18 mg/dL (ref 8–23)
CO2: 30 mmol/L (ref 22–32)
Calcium: 9.1 mg/dL (ref 8.9–10.3)
Chloride: 97 mmol/L — ABNORMAL LOW (ref 98–111)
Creatinine, Ser: 0.82 mg/dL (ref 0.61–1.24)
GFR, Estimated: >60 mL/min (ref >60)
Glucose, Bld: 217 mg/dL — ABNORMAL HIGH (ref 70–99)
Potassium: 4.6 mmol/L (ref 3.5–5.1)
Sodium: 135 mmol/L (ref 135–145)

## 2023-12-16 LAB — GLUCOSE, CAPILLARY: Glucose-Capillary: 160 mg/dL — ABNORMAL HIGH (ref 70–99)

## 2023-12-16 LAB — PHOSPHORUS: Phosphorus: 2.4 mg/dL — ABNORMAL LOW (ref 2.5–4.6)

## 2023-12-16 MED ORDER — PREDNISONE 10 MG PO TABS
ORAL_TABLET | ORAL | 0 refills | Status: DC
  Filled 2023-12-16: qty 32, 16d supply, fill #0

## 2023-12-16 MED ORDER — AZITHROMYCIN 500 MG PO TABS
500.0000 mg | ORAL_TABLET | Freq: Every day | ORAL | 0 refills | Status: AC
  Filled 2023-12-16: qty 2, 2d supply, fill #0

## 2023-12-16 MED ORDER — ALBUTEROL SULFATE HFA 108 (90 BASE) MCG/ACT IN AERS
INHALATION_SPRAY | RESPIRATORY_TRACT | 10 refills | Status: AC
  Filled 2023-12-16: qty 18, 25d supply, fill #0
  Filled 2023-12-16: qty 6.7, 25d supply, fill #0

## 2023-12-16 NOTE — TOC Transition Note (Signed)
 Transition of Care [facility]) - Discharge Note   Patient Details  Name: Duane Cuevas MRN: [id number] Date of Birth: Apr 15, 1956  [facility] CM/SW Contact:  Levie Ream, RN Phone Number: 12/16/2023, 9:46 AM   Clinical Narrative:    Eldora Greet w/ pt in room; pt says travel tank has been delivered to room, and oxygen  has been set up at home; confirmed w/ Ada at [employer]; no TOC needs.   Final next level of care: Home/Self Care Barriers to Discharge: No Barriers Identified   Patient Goals and CMS Choice Patient states their goals for this hospitalization and ongoing recovery are:: Home CMS Medicare.gov Compare Post Acute Care list provided to:: Patient Choice offered to / list presented to : Patient [place] ownership interest in [facility].provided to:: Patient    Discharge Placement                       Discharge Plan and Services Additional resources added to the After Visit Summary for     Discharge Planning Services: CM Consult Post Acute Care Choice: Resumption of Svcs/PTA Provider          DME Arranged: Oxygen  DME Agency: [employer] Date DME Agency Contacted: 12/16/23 Time DME Agency Contacted: 0930 Representative spoke with at DME Agency: Ada            Social Drivers of Health (SDOH) Interventions SDOH Screenings   Food Insecurity: Patient Declined (12/13/2023)  Housing: Patient Declined (12/13/2023)  Transportation Needs: Patient Declined (12/13/2023)  Utilities: Patient Declined (12/13/2023)  Alcohol Screen: Low Risk  (08/02/2023)  Depression (PHQ2-9): Low Risk  (10/24/2023)  Recent Concern: Depression (PHQ2-9) - Medium Risk (08/02/2023)  Financial Resource Strain: Medium Risk (08/02/2023)  Physical Activity: Insufficiently Active (08/02/2023)  Social Connections: Patient Declined (12/13/2023)  Stress: No Stress Concern Present (08/02/2023)  Tobacco Use: Medium Risk (12/13/2023)     Readmission Risk Interventions     03/21/2022   12:55 PM  Readmission Risk Prevention Plan  Post Dischage Appt Complete  Medication Screening Complete  Transportation Screening Complete

## 2023-12-16 NOTE — Discharge Summary (Signed)
 Physician Discharge Summary  Duane Cuevas WUJ:[id number] DOB: [birth year] DOA: 12/13/2023  PCP: Elvira Hammersmith, MD  Admit date: 12/13/2023 Discharge date: 12/16/2023  Admitted From: Home Disposition:  Home with DME O2  Recommendations for Outpatient Follow-up:  Follow up with PCP  Discharge Condition: Stable, improved CODE STATUS: Full  Diet recommendation: Carb modified   Brief/Interim Summary: Duane Cuevas is a 68 y.o. 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  X-ray revealed emphysema and chronic bronchial thickening without evidence of acute chest disease.  Aortic atherosclerosis.   Patient admitted for COPD exacerbation. He continued to improve on prednisone , azithromycin , O2, and breathing treatments. He tested positive for metapneumovirus.   Discharge Diagnoses:   Principal Problem:   COPD with acute exacerbation (HCC) Active Problems:   Uncontrolled type 2 diabetes mellitus with hyperglycemia (HCC)   CAD (coronary artery disease)   Hypokalemia   HLD (hyperlipidemia)   Essential hypertension   Hypocalcemia    COPD exacerbation in setting of metapneumovirus - Continue azithromycin , prednisone  taper for dc, breathing treatments - Respiratory viral panel positive for metapneumovirus - supportive care    Acute on chronic hypoxic respiratory failure - Uses 2 L nasal cannula O2 as needed at baseline.  Currently requiring 2 to 3 L.  Wean as able. DME O2 ordered.    Diabetes mellitus type 2 - A1c 7.2 - Resume home meds    Hyperlipidemia - Lipitor   Hypertension -  Amlodipine    CAD - Aspirin     Discharge Instructions  Discharge Instructions     Call MD for:  difficulty breathing, headache or visual disturbances   Complete by: As directed    Call MD for:  extreme fatigue   Complete by: As directed    Call MD for:  persistant dizziness or light-headedness   Complete by: As directed    Call MD for:  persistant nausea and vomiting   Complete by: As directed    Call MD for:  severe uncontrolled pain   Complete by: As directed    Call MD for:  temperature >100.4   Complete by: As directed    Discharge instructions   Complete by: As directed    You were cared for by a hospitalist during your hospital stay. If you have any questions about your discharge medications or the care you received while you were in the hospital after you are discharged, you can call the unit and ask to speak with the hospitalist on call if the hospitalist that took care of you is not available. Once you are discharged, your primary care physician will handle any further medical issues. Please note that NO REFILLS for any discharge medications will be authorized once you are discharged, as it is imperative that you return to your primary care physician (or establish a relationship with a primary care physician if you do not have one) for your aftercare needs so that they can reassess your need for medications and monitor your lab values.   Increase activity slowly   Complete by: As directed       Allergies as of 12/16/2023   No Known Allergies      Medication List     STOP taking these  medications    diazepam  5 MG tablet Commonly known as: VALIUM    docusate sodium  100 MG capsule Commonly known as: COLACE   meclizine  25 MG tablet Commonly known as: ANTIVERT    methocarbamol  500 MG tablet Commonly known as: ROBAXIN    Ubrelvy  100 MG Tabs Generic drug: Ubrogepant        TAKE these medications    albuterol  (2.5 MG/3ML) 0.083% nebulizer solution Commonly  known as: PROVENTIL  Take 3 mLs (2.5 mg total) by nebulization every 6 (six) hours as needed for wheezing or shortness of breath.   albuterol  108 (90 Base) MCG/ACT inhaler Commonly known as: VENTOLIN  HFA 2 inhalaciones Inhalaci n Cada 6 horas PRN, sibilancias, dificultad para respirar   amLODipine  5 MG tablet Commonly known as: NORVASC  TOMAR 1 TABLETA POR VAI ORAL UNA VEZ AL DIA What changed: See the new instructions.   aspirin  EC 81 MG tablet Take 1 tablet (81 mg total) by mouth daily. Swallow whole.   atorvastatin  20 MG tablet Commonly known as: LIPITOR Take 1 tablet (20 mg total) by mouth daily. TOMAR 1 TABLETA POR VIA ORAL UNA VEZ AL DIA   azithromycin  500 MG tablet Commonly known as: ZITHROMAX  Take 1 tablet (500 mg total) by mouth daily for 2 days. Start taking on: December 17, 2023   Breztri  Aerosphere 160-9-4.8 MCG/ACT Aero inhaler Generic drug: budesonide -glycopyrrolate-formoterol  INHALAR 2 BOCANADAS POR VIA ORAL DOS VECES AL DIA   cyclobenzaprine  10 MG tablet Commonly known as: FLEXERIL  Take 1 tablet (10 mg total) by mouth 2 (two) times daily as needed for muscle spasms.   Farxiga  10 MG Tabs tablet Generic drug: dapagliflozin  propanediol TOMAR 1 TABLETA POR VIA ORAL UNA VEZ AL DIA   gabapentin  300 MG capsule Commonly known as: Neurontin  Take 1 capsule (300 mg total) by mouth at bedtime as needed. What changed: when to take this   glipiZIDE  10 MG tablet Commonly known as: GLUCOTROL  TOMAR 1 TABLETA POR VIA ORAL UNA VEZ AL DIA ANTES DEL DESAYUNO *TOMAR 1 TABLETA ADICIONAL SI AZUCAR EN LA SANGRE ESTA ALTA*   guaifenesin  100 MG/5ML syrup Commonly known as: ROBITUSSIN Take 200 mg by mouth 3 (three) times daily as needed for cough.   ibuprofen  800 MG tablet Commonly known as: ADVIL  Take 1 tablet (800 mg total) by mouth every 8 (eight) hours as needed.   mupirocin  ointment 2 % Commonly known as: BACTROBAN  Apply 1 Application topically daily. Apply daily to your nose  to decrease MRSA colonization.  Follow discharge instructions on paperwork for duration What changed:  when to take this reasons to take this   NovoLOG  FlexPen 100 UNIT/ML FlexPen Generic drug: insulin  aspart INYECTAR 3 UNIDADES POR VIA SUBCUTANEA TRES VECES AL DIA COMO INDICADO. AJUSTAR CANTIDAD DE INSULINA POR ESCALA MOVIL. MAS DOSIS 30 UNIDADES What changed:  how much to take how to take this when to take this additional instructions   pantoprazole  40 MG tablet Commonly known as: PROTONIX  TOMAR 1 TABLETA POR VIA ORAL UNA VEZ AL DIA What changed: See the new instructions.   predniSONE  10 MG tablet Commonly known as: DELTASONE  Take 4 tabs for 3 days, then 3 tabs for 3 days, then 2 tabs for 3 days, then 1 tab for 3 days, then 1/2 tab for 4 days.   sildenafil  100 MG tablet Commonly known as: Viagra  Take 0.5-1 tablets (50-100 mg total) by mouth daily as needed for erectile dysfunction.   Tresiba  FlexTouch 100 UNIT/ML FlexTouch Pen Generic drug: insulin  degludec INYECTAR 25 UNIDADES POR  VIA SUBCUTANEA DIARIAMENTE What changed: See the new instructions.   Trulicity  1.5 MG/0.5ML Soaj Generic drug: Dulaglutide  INYECTAR CONTENIDOS DE UN LAPIZ POR VIA SUBCUTANEA CADA SEMANA, EL MISMO DIA CADA SEMANA What changed:  how much to take how to take this when to take this additional instructions               Durable Medical Equipment  (From admission, onward)           Start     Ordered   12/14/23 1205  For home use only DME oxygen   Once       Question Answer Comment  Length of Need 6 Months   Mode or (Route) Nasal cannula   Liters per Minute 2   Frequency Continuous (stationary and portable oxygen  unit needed)   Oxygen  delivery system Gas      12/14/23 1204            Follow-up Information     Sagardia, Isidro Margo, MD Follow up.   Specialty: Internal Medicine Contact information: [address] [place] [address] [phone]                 No Known Allergies    Procedures/Studies: ECHOCARDIOGRAM COMPLETE Result Date: 12/14/2023    ECHOCARDIOGRAM REPORT   Patient Name:   LUKE FALERO Date of Exam: 12/14/2023 Medical Rec #:  [id number]         Height:       68.0 in Accession #:    [id number]        Weight:       165.4 lb Date of Birth:  1955-12-22         BSA:          1.885 m Patient Age:    68 years          BP:           117/79 mmHg Patient Gender: M                 HR:           103 bpm. Exam Location:  Inpatient Procedure: 2D Echo, Cardiac Doppler and Color Doppler (Both Spectral and Color            Flow Doppler were utilized during procedure). Indications:    R94.31 Abnormal EKG  History:        Patient has prior history of Echocardiogram examinations, most                 recent 04/28/2020.  Sonographer:    Andrena Bang Referring Phys: [id number] DAVID MANUEL ORTIZ IMPRESSIONS  1. Left ventricular ejection fraction, by estimation, is 65 to 70%. The left ventricle has normal function. The left ventricle has no regional wall motion abnormalities. Left ventricular diastolic parameters were normal.  2. Right ventricular systolic function is normal. The right ventricular size is normal. There is moderately elevated pulmonary artery systolic pressure. The estimated right ventricular systolic pressure is 59.9 mmHg.  3. The mitral valve is normal in structure. No evidence of mitral valve regurgitation. No evidence of mitral stenosis.  4. The aortic valve is tricuspid. There is mild calcification of the aortic valve. Aortic valve regurgitation is not visualized. Aortic valve sclerosis is present, with no evidence of aortic valve stenosis.  5. The inferior vena cava is dilated in size with <50% respiratory variability, suggesting right atrial pressure of 15 mmHg. Comparison(s): No significant change from prior study. RVSP 59.9 on  current study, prior in 2021 was 54.7 mmHg. Conclusion(s)/Recommendation(s): Normal biventricular function, elevated  right sided pressures as noted, no significant valve disease. FINDINGS  Left Ventricle: Left ventricular ejection fraction, by estimation, is 65 to 70%. The left ventricle has normal function. The left ventricle has no regional wall motion abnormalities. The left ventricular internal cavity size was normal in size. There is  no left ventricular hypertrophy. Left ventricular diastolic parameters were normal. Right Ventricle: The right ventricular size is normal. No increase in right ventricular wall thickness. Right ventricular systolic function is normal. There is moderately elevated pulmonary artery systolic pressure. The tricuspid regurgitant velocity is 3.35 m/s, and with an assumed right atrial pressure of 15 mmHg, the estimated right ventricular systolic pressure is 59.9 mmHg. Left Atrium: Left atrial size was normal in size. Right Atrium: Right atrial size was not well visualized. Pericardium: There is no evidence of pericardial effusion. Mitral Valve: The mitral valve is normal in structure. No evidence of mitral valve regurgitation. No evidence of mitral valve stenosis. Tricuspid Valve: The tricuspid valve is normal in structure. Tricuspid valve regurgitation is mild . No evidence of tricuspid stenosis. Aortic Valve: The aortic valve is tricuspid. There is mild calcification of the aortic valve. Aortic valve regurgitation is not visualized. Aortic valve sclerosis is present, with no evidence of aortic valve stenosis. Aortic valve mean gradient measures 4.0 mmHg. Aortic valve peak gradient measures 8.3 mmHg. Aortic valve area, by VTI measures 2.45 cm. Pulmonic Valve: The pulmonic valve was not well visualized. Pulmonic valve regurgitation is not visualized. No evidence of pulmonic stenosis. Aorta: The aortic root, ascending aorta, aortic arch and descending aorta are all structurally normal, with no evidence of dilitation or obstruction. Venous: The inferior vena cava is dilated in size with less than 50%  respiratory variability, suggesting right atrial pressure of 15 mmHg. IAS/Shunts: The interatrial septum was not well visualized.  LEFT VENTRICLE PLAX 2D LVIDd:         4.70 cm     Diastology LVIDs:         3.00 cm     LV e' lateral:   13.40 cm/s LV PW:         1.00 cm     LV E/e' lateral: 8.8 LV IVS:        1.00 cm LVOT diam:     1.80 cm LV SV:         75 LV SV Index:   40 LVOT Area:     2.54 cm  LV Volumes (MOD) LV vol d, MOD A2C: 76.2 ml LV vol d, MOD A4C: 64.2 ml LV vol s, MOD A2C: 21.9 ml LV vol s, MOD A4C: 24.5 ml LV SV MOD A2C:     54.3 ml LV SV MOD A4C:     64.2 ml LV SV MOD BP:      46.2 ml RIGHT VENTRICLE RV S prime:     13.20 cm/s TAPSE (M-mode): 2.3 cm LEFT ATRIUM             Index LA diam:        3.70 cm 1.96 cm/m LA Vol (A2C):   29.7 ml 15.75 ml/m LA Vol (A4C):   26.5 ml 14.06 ml/m LA Biplane Vol: 28.5 ml 15.12 ml/m  AORTIC VALVE AV Area (Vmax):    2.47 cm AV Area (Vmean):   1.98 cm AV Area (VTI):     2.45 cm AV Vmax:  144.00 cm/s AV Vmean:          98.400 cm/s AV VTI:            0.307 m AV Peak Grad:      8.3 mmHg AV Mean Grad:      4.0 mmHg LVOT Vmax:         140.00 cm/s LVOT Vmean:        76.600 cm/s LVOT VTI:          0.296 m LVOT/AV VTI ratio: 0.96  AORTA Ao Asc diam: 3.60 cm MITRAL VALVE                TRICUSPID VALVE MV Area (PHT): 6.88 cm     TR Peak grad:   44.9 mmHg MV E velocity: 118.00 cm/s  TR Vmax:        335.00 cm/s MV A velocity: 135.00 cm/s MV E/A ratio:  0.87         SHUNTS                             Systemic VTI:  0.30 m                             Systemic Diam: 1.80 cm Sheryle Donning MD Electronically signed by Sheryle Donning MD Signature Date/Time: 12/14/2023/12:55:47 PM    Final    DG Chest 2 View Result Date: 12/13/2023 CLINICAL DATA:  Shortness of breath, onset 2 days ago. Difficulty breathing. EXAM: CHEST - 2 VIEW COMPARISON:  Portable chest 10/07/2023 FINDINGS: The lungs are mildly emphysematous with chronic central bronchial thickening  without evidence of focal pneumonia. No pleural effusion is seen. The cardiomediastinal silhouette and vasculature are normal except for calcifications in the transverse aorta. 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 No acute osseous findings. IMPRESSION: 1. Emphysema and chronic bronchial thickening without evidence of acute chest disease. 2. Aortic atherosclerosis. Electronically Signed   By: Denman Fischer M.D.   On: 12/13/2023 04:52   XR C-ARM NO REPORT Result Date: 11/21/2023 Please see Notes tab for imaging impression.  Epidural Steroid injection Result Date: 11/21/2023 Bridget Campion, MD     11/29/2023  5:49 AM Cervical Epidural Steroid Injection - Interlaminar Approach with Fluoroscopic Guidance Patient: Garvin Ellena     Date of Birth: May 05, 1956 MRN: [id number] PCP: Elvira Hammersmith, MD     Visit Date: 11/21/2023  Universal Protocol:   Date/Time: 05/28/255:49 AM Consent Given By: the patient Position: PRONE Additional Comments: Vital signs were monitored before and after the procedure. Patient was prepped and draped in the usual sterile fashion. The correct patient, procedure, and site was verified. Injection Procedure Details: Procedure diagnoses: Cervical radiculopathy [M54.12]  Meds Administered: Meds ordered this encounter Medications  methylPREDNISolone  acetate (DEPO-MEDROL ) injection 40 mg  Laterality: Right Location/Site: C7-T1 Needle: 3.5 in., 20 ga. Tuohy Needle Placement: Paramedian epidural space Findings:  -Comments: Excellent flow of contrast into the epidural space. Procedure Details: Using a paramedian approach from the side mentioned above, the region overlying the inferior lamina was localized under fluoroscopic visualization and the soft tissues overlying this structure were infiltrated with 4 ml. of 1% Lidocaine  without Epinephrine . A # 20 gauge, Tuohy needle was inserted into the epidural space using a paramedian approach. The epidural space was localized  using loss of resistance along with contralateral oblique bi-planar fluoroscopic views.  After negative aspirate for air, blood,  and CSF, a 2 ml. volume of Isovue-250 was injected into the epidural space and the flow of contrast was observed. Radiographs were obtained for documentation purposes. The injectate was administered into the level noted above. Additional Comments: The patient tolerated the procedure well Dressing: 2 x 2 sterile gauze and Band-Aid  Post-procedure details: Patient was observed during the procedure. Post-procedure instructions were reviewed. Patient left the clinic in stable condition.     Discharge Exam: Vitals:   12/16/23 0739 12/16/23 0839  BP:  115/75  Pulse:  98  Resp:  18  Temp:  98.2 F (36.8 C)  SpO2: 90% 96%    General: Pt is alert, awake, not in acute distress Cardiovascular: RRR, S1/S2 +, no edema Respiratory: CTA bilaterally, moving more air, no wheezing, no rhonchi, no respiratory distress, no conversational dyspnea, on [place] O2  Abdominal: Soft, NT, ND, bowel sounds + Extremities: no edema, no cyanosis Psych: Normal mood and affect, stable judgement and insight     The results of significant diagnostics from this hospitalization (including imaging, microbiology, ancillary and laboratory) are listed below for reference.     Microbiology: Recent Results (from the past 240 hours)  SARS Coronavirus 2 by RT PCR (hospital order, performed in [facility] hospital lab) *cepheid single result test* Anterior Nasal Swab     Status: None   Collection Time: 12/14/23 12:58 PM   Specimen: Anterior Nasal Swab  Result Value Ref Range Status   SARS Coronavirus 2 by RT PCR NEGATIVE NEGATIVE Final    Comment: (NOTE) SARS-CoV-2 target nucleic acids are NOT DETECTED.  The SARS-CoV-2 RNA is generally detectable in upper and lower respiratory specimens during the acute phase of infection. The lowest concentration of SARS-CoV-2 viral copies this assay can detect is  250 copies / mL. A negative result does not preclude SARS-CoV-2 infection and should not be used as the sole basis for treatment or other patient management decisions.  A negative result may occur with improper specimen collection / handling, submission of specimen other than nasopharyngeal swab, presence of viral mutation(s) within the areas targeted by this assay, and inadequate number of viral copies (<250 copies / mL). A negative result must be combined with clinical observations, patient history, and epidemiological information.  Fact Sheet for Patients:   RoadLapTop.co.za  Fact Sheet for Healthcare Providers: http://kim-miller.com/  This test is not yet approved or  cleared by the [place]  FDA and has been authorized for detection and/or diagnosis of SARS-CoV-2 by FDA under an Emergency Use Authorization (EUA).  This EUA will remain in effect (meaning this test can be used) for the duration of the COVID-19 declaration under Section 564(b)(1) of the Act, 21 U.S.C. section 360bbb-3(b)(1), unless the authorization is terminated or revoked sooner.  Performed at [name], [address], [place], [place] [address]   Respiratory (~20 pathogens) panel by PCR     Status: Abnormal   Collection Time: 12/14/23 12:58 PM   Specimen: Nasopharyngeal Swab; Respiratory  Result Value Ref Range Status   Adenovirus NOT DETECTED NOT DETECTED Final   Coronavirus 229E NOT DETECTED NOT DETECTED Final    Comment: (NOTE) The Coronavirus on the Respiratory Panel, DOES NOT test for the novel  Coronavirus (2019 nCoV)    Coronavirus HKU1 NOT DETECTED NOT DETECTED Final   Coronavirus NL63 NOT DETECTED NOT DETECTED Final   Coronavirus OC43 NOT DETECTED NOT DETECTED Final   Metapneumovirus DETECTED (A) NOT DETECTED Final   Rhinovirus / Enterovirus NOT DETECTED  NOT DETECTED Final   Influenza A NOT DETECTED NOT DETECTED Final    Influenza B NOT DETECTED NOT DETECTED Final   Parainfluenza Virus 1 NOT DETECTED NOT DETECTED Final   Parainfluenza Virus 2 NOT DETECTED NOT DETECTED Final   Parainfluenza Virus 3 NOT DETECTED NOT DETECTED Final   Parainfluenza Virus 4 NOT DETECTED NOT DETECTED Final   Respiratory Syncytial Virus NOT DETECTED NOT DETECTED Final   Bordetella pertussis NOT DETECTED NOT DETECTED Final   Bordetella Parapertussis NOT DETECTED NOT DETECTED Final   Chlamydophila pneumoniae NOT DETECTED NOT DETECTED Final   Mycoplasma pneumoniae NOT DETECTED NOT DETECTED Final    Comment: Performed at [facility], [address], [place], [place] [address]     Labs: BNP (last 3 results) Recent Labs    10/07/23 1055 12/13/23 1328  BNP 141.1* 46.4   Basic Metabolic Panel: Recent Labs  Lab 12/13/23 0501 12/13/23 1328 12/14/23 0516 12/16/23 0528  NA 135  --  135 135  K 3.3*  --  4.3 4.6  CL 102  --  103 97*  CO2 23  --  23 30  GLUCOSE 151*  --  175* 217*  BUN 15  --  28* 18  CREATININE 0.94  --  0.88 0.82  CALCIUM  8.1*  --  8.9 9.1  MG  --   --   --  2.2  PHOS  --  2.9  --  2.4*   Liver Function Tests: Recent Labs  Lab 12/14/23 0516  AST 22  ALT 27  ALKPHOS 86  BILITOT 1.0  PROT 7.3  ALBUMIN  3.6   No results for input(s): LIPASE, AMYLASE in the last 168 hours. No results for input(s): AMMONIA in the last 168 hours. CBC: Recent Labs  Lab 12/13/23 0501 12/14/23 0516  WBC 8.1 8.6  HGB 16.3 15.8  HCT 49.3 47.6  MCV 89.6 90.7  PLT 193 197   Cardiac Enzymes: No results for input(s): CKTOTAL, CKMB, CKMBINDEX, TROPONINI in the last 168 hours. BNP: Invalid input(s): POCBNP CBG: Recent Labs  Lab 12/15/23 0733 12/15/23 1137 12/15/23 1630 12/15/23 2021 12/16/23 0728  GLUCAP 198* 209* 291* 326* 160*   D-Dimer No results for input(s): DDIMER in the last 72 hours. Hgb A1c Recent Labs    12/14/23 1249  HGBA1C 7.2*   Lipid Profile No results for  input(s): CHOL, HDL, LDLCALC, TRIG, CHOLHDL, LDLDIRECT in the last 72 hours. Thyroid function studies No results for input(s): TSH, T4TOTAL, T3FREE, THYROIDAB in the last 72 hours.  Invalid input(s): FREET3 Anemia work up No results for input(s): VITAMINB12, FOLATE, FERRITIN, TIBC, IRON, RETICCTPCT in the last 72 hours. Urinalysis    Component Value Date/Time   COLORURINE YELLOW 08/04/2023 1120   APPEARANCEUR CLEAR 08/04/2023 1120   LABSPEC 1.024 08/04/2023 1120   PHURINE 5.0 08/04/2023 1120   GLUCOSEU >=500 (A) 08/04/2023 1120   HGBUR SMALL (A) 08/04/2023 1120   BILIRUBINUR NEGATIVE 08/04/2023 1120   KETONESUR NEGATIVE 08/04/2023 1120   PROTEINUR NEGATIVE 08/04/2023 1120   UROBILINOGEN 0.2 10/14/2014 1712   NITRITE NEGATIVE 08/04/2023 1120   LEUKOCYTESUR MODERATE (A) 08/04/2023 1120   Sepsis Labs Recent Labs  Lab 12/13/23 0501 12/14/23 0516  WBC 8.1 8.6   Microbiology Recent Results (from the past 240 hours)  SARS Coronavirus 2 by RT PCR (hospital order, performed in [facility] hospital lab) *cepheid single result test* Anterior Nasal Swab     Status: None   Collection Time: 12/14/23 12:58 PM  Specimen: Anterior Nasal Swab  Result Value Ref Range Status   SARS Coronavirus 2 by RT PCR NEGATIVE NEGATIVE Final    Comment: (NOTE) SARS-CoV-2 target nucleic acids are NOT DETECTED.  The SARS-CoV-2 RNA is generally detectable in upper and lower respiratory specimens during the acute phase of infection. The lowest concentration of SARS-CoV-2 viral copies this assay can detect is 250 copies / mL. A negative result does not preclude SARS-CoV-2 infection and should not be used as the sole basis for treatment or other patient management decisions.  A negative result may occur with improper specimen collection / handling, submission of specimen other than nasopharyngeal swab, presence of viral mutation(s) within the areas targeted by this assay,  and inadequate number of viral copies (<250 copies / mL). A negative result must be combined with clinical observations, patient history, and epidemiological information.  Fact Sheet for Patients:   RoadLapTop.co.za  Fact Sheet for Healthcare Providers: http://kim-miller.com/  This test is not yet approved or  cleared by the [place]  FDA and has been authorized for detection and/or diagnosis of SARS-CoV-2 by FDA under an Emergency Use Authorization (EUA).  This EUA will remain in effect (meaning this test can be used) for the duration of the COVID-19 declaration under Section 564(b)(1) of the Act, 21 U.S.C. section 360bbb-3(b)(1), unless the authorization is terminated or revoked sooner.  Performed at [facility], [address], [place], [place] [address]   Respiratory (~20 pathogens) panel by PCR     Status: Abnormal   Collection Time: 12/14/23 12:58 PM   Specimen: Nasopharyngeal Swab; Respiratory  Result Value Ref Range Status   Adenovirus NOT DETECTED NOT DETECTED Final   Coronavirus 229E NOT DETECTED NOT DETECTED Final    Comment: (NOTE) The Coronavirus on the Respiratory Panel, DOES NOT test for the novel  Coronavirus (2019 nCoV)    Coronavirus HKU1 NOT DETECTED NOT DETECTED Final   Coronavirus NL63 NOT DETECTED NOT DETECTED Final   Coronavirus OC43 NOT DETECTED NOT DETECTED Final   Metapneumovirus DETECTED (A) NOT DETECTED Final   Rhinovirus / Enterovirus NOT DETECTED NOT DETECTED Final   Influenza A NOT DETECTED NOT DETECTED Final   Influenza B NOT DETECTED NOT DETECTED Final   Parainfluenza Virus 1 NOT DETECTED NOT DETECTED Final   Parainfluenza Virus 2 NOT DETECTED NOT DETECTED Final   Parainfluenza Virus 3 NOT DETECTED NOT DETECTED Final   Parainfluenza Virus 4 NOT DETECTED NOT DETECTED Final   Respiratory Syncytial Virus NOT DETECTED NOT DETECTED Final   Bordetella pertussis NOT DETECTED NOT  DETECTED Final   Bordetella Parapertussis NOT DETECTED NOT DETECTED Final   Chlamydophila pneumoniae NOT DETECTED NOT DETECTED Final   Mycoplasma pneumoniae NOT DETECTED NOT DETECTED Final    Comment: Performed at [facility], [address] [place], [place] [address]     Patient was seen and examined on the day of discharge and was found to be in stable condition. Time coordinating discharge: 25 minutes including assessment and coordination of care, as well as examination of the patient.   SIGNED:  Daren Eck, DO [facility] 12/16/2023, 9:21 AM

## 2023-12-16 NOTE — Progress Notes (Signed)
 All AVS meds reviewed with patient including times when to take medications next.

## 2023-12-18 ENCOUNTER — Telehealth: Payer: Self-pay

## 2023-12-18 NOTE — Transitions of Care (Post Inpatient/ED Visit) (Unsigned)
   12/18/2023  Name: Duane Cuevas MRN: [id number] DOB: [birth year]  Today's TOC FU Call Status: Today's TOC FU Call Status:: Unsuccessful Call (1st Attempt) Unsuccessful Call (1st Attempt) Date: 12/18/23  Attempted to reach the patient regarding the most recent Inpatient/ED visit.  Follow Up Plan: Additional outreach attempts will be made to reach the patient to complete the Transitions of Care (Post Inpatient/ED visit) call.   Signature Darrall Ellison, LPN [facility] Nurse Health Advisor Direct Dial  [phone]

## 2023-12-19 NOTE — Telephone Encounter (Signed)
 Copied from CRM [id number]. Topic: General - Other >> Dec 18, 2023  1:37 PM Howard Macho wrote: Reason for CRM: patient daughter returning a call to [place] (transitions of care)for the patient CB [phone]

## 2023-12-19 NOTE — Transitions of Care (Post Inpatient/ED Visit) (Signed)
 12/19/2023  Name: Duane Cuevas MRN: [id number] DOB: [birth year]  Today's TOC FU Call Status: Today's TOC FU Call Status:: Successful TOC FU Call Completed Unsuccessful Call (1st Attempt) Date: 12/18/23 [facility] FU Call Complete Date: 12/19/23 Patient's Name and Date of Birth confirmed.  Transition Care Management Follow-up Telephone Call Date of Discharge: 12/21/23 Discharge Facility: [facility]) Type of Discharge: Inpatient Admission Primary Inpatient Discharge Diagnosis:: ARF How have you been since you were released from the hospital?: Better Any questions or concerns?: No  Items Reviewed: Did you receive and understand the discharge instructions provided?: Yes Medications obtained,verified, and reconciled?: Yes (Medications Reviewed) Any new allergies since your discharge?: No Dietary orders reviewed?: Yes Do you have support at home?: Yes People in Home [RPT]: spouse  Medications Reviewed Today: Medications Reviewed Today     Reviewed by Darrall Ellison, LPN (Licensed Practical Nurse) on 12/19/23 at 1517  Med List Status: <None>   Medication Order Taking? Sig Documenting Provider Last Dose Status Informant  albuterol  (PROVENTIL ) (2.5 MG/3ML) 0.083% nebulizer solution [id number] Yes Take 3 mLs (2.5 mg total) by nebulization every 6 (six) hours as needed for wheezing or shortness of breath. Sagardia, Miguel Jose, MD  Active Self, Pharmacy Records  albuterol  (VENTOLIN  HFA) [phone]) MCG/ACT inhaler [id number] Yes 2 inhalaciones Inhalaci n Cada 6 horas PRN, sibilancias, dificultad para respirar Wendall Halls, Bridgette Campus, DO  Active   amLODipine  (NORVASC ) 5 MG tablet [id number] Yes TOMAR 1 TABLETA POR VAI ORAL UNA VEZ AL DIA  Patient taking differently: Take 5 mg by mouth in the morning.   Elvira Hammersmith, MD  Active Self, Pharmacy Records           Med Note Micah Ade   Wed Dec 13, 2023  6:59 AM)    aspirin  EC 81 MG tablet [id number] Yes Take 1 tablet (81 mg total)  by mouth daily. Swallow whole. Magnant, Justice Olp, PA-C  Active Self, Pharmacy Records  atorvastatin  (LIPITOR) 20 MG tablet [id number] Yes Take 1 tablet (20 mg total) by mouth daily. TOMAR 1 TABLETA POR VIA ORAL UNA VEZ AL DIA Sagardia, Isidro Margo, MD  Active Self, Pharmacy Records  azithromycin  (ZITHROMAX ) 500 MG tablet [id number] Yes Take 1 tablet (500 mg total) by mouth daily for 2 days. Daren Eck, DO  Active   Budeson-Glycopyrrol-Formoterol  (BREZTRI  AEROSPHERE) 160-9-4.8 MCG/ACT Sudie Ely [id number] Yes INHALAR 2 BOCANADAS POR VIA ORAL DOS VECES AL DIA Sagardia, Isidro Margo, MD  Active Self, Pharmacy Records           Med Note Micah Ade   Wed Dec 13, 2023  7:00 AM)    cyclobenzaprine  (FLEXERIL ) 10 MG tablet [id number] Yes Take 1 tablet (10 mg total) by mouth 2 (two) times daily as needed for muscle spasms. Debbra Fairy, PA-C  Active Self, Pharmacy Records           Med Note Jane Meager Dec 13, 2023  7:00 AM)    Dulaglutide  (TRULICITY ) 1.5 MG/0.5ML [place] [id number] Yes INYECTAR CONTENIDOS DE UN LAPIZ POR VIA SUBCUTANEA CADA SEMANA, EL MISMO DIA CADA [facility]  Patient taking differently: Inject 1.5 mg into the skin once a week. On Thursday   Sagardia, Miguel Jose, MD  Active Self, Pharmacy Records  FARXIGA  10 MG TABS tablet [id number] Yes TOMAR 1 TABLETA POR VIA ORAL UNA VEZ AL DIA Elvira Hammersmith, MD  Active Self, Pharmacy Records  gabapentin  (NEURONTIN ) 300 MG capsule [id number] Yes Take 1 capsule (300 mg total)  by mouth at bedtime as needed.  Patient taking differently: Take 300 mg by mouth at bedtime.   Magnant, Justice Olp, PA-C  Active Self, Pharmacy Records           Med Note Jane Meager Dec 13, 2023  7:00 AM)    glipiZIDE  (GLUCOTROL ) 10 MG tablet [id number] Yes TOMAR 1 TABLETA POR VIA ORAL UNA VEZ AL DIA ANTES DEL DESAYUNO *TOMAR 1 TABLETA ADICIONAL SI AZUCAR EN LA SANGRE ESTA ALTA* Sagardia, Isidro Margo, MD  Active Self, Pharmacy Records  guaifenesin   [facility]) 100 MG/5ML syrup [id number] Yes Take 200 mg by mouth 3 (three) times daily as needed for cough. [provider]  Active Self, Pharmacy Records  ibuprofen  (ADVIL ) 800 MG tablet [id number] Yes Take 1 tablet (800 mg total) by mouth every 8 (eight) hours as needed. Magnant, Justice Olp, PA-C  Active Self, Pharmacy Records           Med Note Jane Meager Dec 13, 2023  7:00 AM)    insulin  aspart (NOVOLOG  FLEXPEN) 100 UNIT/ML FlexPen [id number] Yes INYECTAR 3 UNIDADES POR VIA SUBCUTANEA TRES VECES AL DIA COMO INDICADO. AJUSTAR CANTIDAD DE INSULINA POR ESCALA MOVIL. MAS DOSIS 30 UNIDADES  Patient taking differently: Inject 0-3 Units into the skin in the morning, at noon, and at bedtime. INYECTAR 3 UNIDADES POR VIA SUBCUTANEA TRES VECES AL DIA COMO INDICADO. AJUSTAR CANTIDAD DE INSULINA POR ESCALA MOVIL. MAS DOSIS 30 UNIDADES.   Elvira Hammersmith, MD  Active Self, Pharmacy Records  morphine  2 MG/ML injection [id number]     Active   mupirocin  ointment (BACTROBAN ) 2 % [id number] Yes Apply 1 Application topically daily. 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  Follow discharge instructions on paperwork for duration  Patient taking differently: Apply 1 Application topically daily as needed. 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  Follow discharge instructions on paperwork for duration   Magnant, Justice Olp, PA-C  Active Self, Pharmacy Records           Med Note Micah Ade   Wed Dec 13, 2023  7:00 AM)    pantoprazole  (PROTONIX ) 40 MG tablet [id number] Yes TOMAR 1 TABLETA POR VIA ORAL UNA VEZ AL DIA  Patient taking differently: Take 40 mg by mouth in the morning.   Sagardia, Miguel Jose, MD  Active Self, Pharmacy Records           Med Note Micah Ade   Wed Dec 13, 2023  7:00 AM)    predniSONE  (DELTASONE ) 10 MG tablet [id number] Yes Take 4 tabs daily for 3 days, then 3 tabs daily for 3 days, then 2 tabs daily for 3 days, then 1 tab  daily for 3 days, then 1/2 tab daily for 4 days. Daren Eck, DO  Active   sildenafil  (VIAGRA ) 100 MG tablet [id number] Yes Take 0.5-1 tablets (50-100 mg total) by mouth daily as needed for erectile dysfunction. Sagardia, Miguel Jose, MD  Active Self, Pharmacy Records  TRESIBA  FLEXTOUCH 100 UNIT/ML FlexTouch Pen [id number] Yes INYECTAR 25 UNIDADES POR VIA SUBCUTANEA DIARIAMENTE  Patient taking differently: Inject 30 Units into the skin in the morning.   Sagardia, Miguel Jose, MD  Active Self, Pharmacy Records           Med Note Micah Ade   Wed Dec 13, 2023  7:00 AM)              Home  Care and Equipment/Supplies: Were Home Health Services Ordered?: Yes Name of Home Health Agency:: unknown Has Agency set up a time to come to your home?: No Any new equipment or medical supplies ordered?: NA  Functional Questionnaire: Do you need assistance with bathing/showering or dressing?: Yes Do you need assistance with meal preparation?: Yes Do you need assistance with eating?: No Do you have difficulty maintaining continence: No Do you need assistance with getting out of bed/getting out of a chair/moving?: No Do you have difficulty managing or taking your medications?: No  Follow up appointments reviewed: PCP Follow-up appointment confirmed?: Yes Date of PCP follow-up appointment?: 12/21/23 Follow-up Provider: [facility] Follow-up appointment confirmed?: NA Do you need transportation to your follow-up appointment?: No Do you understand care options if your condition(s) worsen?: Yes-patient verbalized understanding    SIGNATURE Darrall Ellison, LPN [facility] Nurse Health Advisor Direct Dial  [phone]

## 2023-12-21 ENCOUNTER — Ambulatory Visit: Admitting: Emergency Medicine

## 2023-12-21 ENCOUNTER — Encounter: Payer: Self-pay | Admitting: Emergency Medicine

## 2023-12-21 VITALS — BP 114/70 | HR 100 | Temp 98.6°F | Ht 68.0 in | Wt 163.0 lb

## 2023-12-21 DIAGNOSIS — I152 Hypertension secondary to endocrine disorders: Secondary | ICD-10-CM

## 2023-12-21 DIAGNOSIS — J431 Panlobular emphysema: Secondary | ICD-10-CM | POA: Diagnosis not present

## 2023-12-21 DIAGNOSIS — Z7985 Long-term (current) use of injectable non-insulin antidiabetic drugs: Secondary | ICD-10-CM | POA: Diagnosis not present

## 2023-12-21 DIAGNOSIS — I2510 Atherosclerotic heart disease of native coronary artery without angina pectoris: Secondary | ICD-10-CM

## 2023-12-21 DIAGNOSIS — E1169 Type 2 diabetes mellitus with other specified complication: Secondary | ICD-10-CM | POA: Diagnosis not present

## 2023-12-21 DIAGNOSIS — E1159 Type 2 diabetes mellitus with other circulatory complications: Secondary | ICD-10-CM

## 2023-12-21 DIAGNOSIS — Z09 Encounter for follow-up examination after completed treatment for conditions other than malignant neoplasm: Secondary | ICD-10-CM | POA: Diagnosis not present

## 2023-12-21 DIAGNOSIS — E785 Hyperlipidemia, unspecified: Secondary | ICD-10-CM | POA: Diagnosis not present

## 2023-12-21 DIAGNOSIS — Z7984 Long term (current) use of oral hypoglycemic drugs: Secondary | ICD-10-CM

## 2023-12-21 DIAGNOSIS — J449 Chronic obstructive pulmonary disease, unspecified: Secondary | ICD-10-CM | POA: Diagnosis not present

## 2023-12-21 DIAGNOSIS — I10 Essential (primary) hypertension: Secondary | ICD-10-CM

## 2023-12-21 NOTE — Patient Instructions (Signed)
 Exacerbacin de la enfermedad pulmonar obstructiva crnica Chronic Obstructive Pulmonary Disease Exacerbation  La enfermedad pulmonar obstructiva crnica (EPOC) es un problema pulmonar de larga duracin (crnico). Cuando una persona tiene EPOC, puede resultarle ms difcil inhalar o exhalar. La exacerbacin de la EPOC es un agravamiento de los sntomas cuando la respiracin empeora y se necesita ms tratamiento. Sin tratamiento, las Associate Professor en peligro la vida. Si ocurren con frecuencia, los pulmones pueden daarse an ms. Cules son las causas? No tomar los medicamentos habituales para la EPOC segn las indicaciones del mdico. Un resfro o gripe, que puede causar [employer] . Exponerse a cosas que empeoran la respiracin, por ejemplo: Humo. Contaminacin del aire. Gases. Polvo. Alergias. Cambios climticos. Cules son los signos o sntomas? Los sntomas no mejoran o empeoran aunque tome los medicamentos segn las indicaciones del mdico. Entre los sntomas, se pueden incluir los siguientes: Ms dificultad para Industrial/product designer. Es posible que solo pueda hablar una o dos palabras a la vez. Ms tos o mucosidad de los pulmones. Ms sibilancias u opresin en el pecho. Tener ms cansancio y [employer]. Confusin. Cmo se diagnostica? Esta afeccin se diagnostica en funcin de lo siguiente: Sntomas que empeoran. Sus antecedentes mdicos. Un examen fsico. Tambin pueden hacerle estudios, que incluyen los siguientes: Radiografa de [place]. Anlisis de sangre o de la mucosidad. Cmo se trata? Es posible que Pension scheme manager en su casa, o tal vez sea necesario que vaya al hospital. El tratamiento puede incluir: Usar medicamentos. Pueden incluir: Inhaladores. Estos contienen medicamentos que se inhalan. Pueden ser ms de los que ya usa  o pueden ser nuevos. Corticoesteroides. Reducen la inflamacin en las vas respiratorias. Pueden inhalarse, tomarse por la  boca o administrarse a travs de una va intravenosa (IV). Antibiticos. Tratan las infecciones. Usar oxgeno. Usar un dispositivo que le ayude a eliminar la mucosidad. Siga estas indicaciones en su casa: Medicamentos Use los medicamentos solamente como se lo haya indicado el mdico. Si le dieron antibiticos o corticoesteroides, tmelos o selos como se lo haya indicado el mdico. No deje de tomarlos aunque comience a sentirse mejor. Estilo de vida [employer] manos con agua y jabn durante al menos 20 segundos varias veces al [employer]. Utilice un desinfectante para manos si no dispone de agua y jabn. Esto puede ayudar a evitar que contraiga una infeccin. Evite estar cerca de multitudes o de personas enfermas. No fume ni consuma ningn producto que contenga nicotina o tabaco. Si necesita ayuda para dejar de consumir estos productos, consulte al mdico. Retome sus actividades normales cuando el mdico le autorice hacerlo. Use mtodos de respiracin para controlar el estrs y recuperar el aliento. Cmo se previene? Siga el plan de accin para la EPOC. El plan de accin le indica qu hacer si se siente bien y qu hacer cuando comienza a Paediatric nurse. Analice el plan con frecuencia con su mdico. Asegrese de recibir todas las vacunas que el mdico le recomiende. Consulte al [employer] vacunas para la gripe y la neumona. Use la oxigenoterapia si el mdico se lo indica. Si necesita oxigenoterapia en su casa, pregntele al mdico con qu frecuencia debe controlar su nivel de oxgeno con un dispositivo llamado "oxmetro". Concurra a todas las visitas de seguimiento para revisar su plan de accin para la EPOC. El mdico querr controlar su afeccin con frecuencia para mantenerlo sano y fuera del hospital. Comunquese con un mdico si: Sus sntomas de la EPOC empeoran. Tiene fiebre o escalofros. Tiene dificultad para [employer]  actividades cotidianas. Tiene dificultad para respirar incluso  cuando descansa. Solicite ayuda de inmediato si: Le falta el aire y no puede hacer lo siguiente: Expresarse con oraciones completas. Hacer actividades normales. Tiene dolor en el pecho. Se siente confundido. Estos sntomas pueden Customer service manager. Llame al 911 de inmediato. No espere a ver si los sntomas desaparecen. No conduzca por sus propios medios [employer]. Esta informacin no tiene Theme park manager el consejo del mdico. Asegrese de hacerle al mdico cualquier pregunta que tenga. Document Revised: 10/19/2022 Document Reviewed: 10/19/2022 Elsevier Patient Education  2024 [employer].

## 2023-12-21 NOTE — Assessment & Plan Note (Signed)
 Chronic stable conditions Continue atorvastatin 20 mg daily Diet and nutrition discussed

## 2023-12-21 NOTE — Progress Notes (Signed)
 Duane Cuevas 68 y.o.   Chief Complaint  Patient presents with   Hospitalization Follow-up    Patient here for HFU for COPD on 12/13/23 states he had difficulty breathing. Today is feeling a little better. Patient mentions his sugar levels has been high around 500, he states before coming here it was 375. He is taking all his medications     HISTORY OF PRESENT ILLNESS: This is a 68 y.o. male here for hospital discharge follow-up.  Physician Discharge Summary  Duane Cuevas FMW:[id number] DOB: 03/08/1956 DOA: 12/13/2023   PCP: Purcell Emil Schanz, MD   Admit date: 12/13/2023 Discharge date: 12/16/2023   Admitted From: Home Disposition:  Home with DME O2   Recommendations for Outpatient Follow-up:  Follow up with PCP   Discharge Condition: Stable, improved CODE STATUS: Full  Diet recommendation: Carb modified    Brief/Interim Summary: Duane Cuevas is a 68 y.o. 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  X-ray revealed emphysema and chronic bronchial thickening without evidence of acute chest disease.  Aortic atherosclerosis.   Patient admitted for COPD exacerbation. He continued to improve on prednisone , azithromycin , O2, and breathing treatments. He tested positive for metapneumovirus.    Discharge Diagnoses:    Principal Problem:   COPD with acute exacerbation (HCC) Active Problems:   Uncontrolled type 2 diabetes mellitus with hyperglycemia (HCC)   CAD (coronary artery disease)   Hypokalemia   HLD (hyperlipidemia)   Essential hypertension   Hypocalcemia      COPD exacerbation in  setting of metapneumovirus - Continue azithromycin , prednisone  taper for dc, breathing treatments - Respiratory viral panel positive for metapneumovirus - supportive care    Acute on chronic hypoxic respiratory failure - Uses 2 L nasal cannula O2 as needed at baseline.  Currently requiring 2 to 3 L.  Wean as able. DME O2 ordered.    Diabetes mellitus type 2 - A1c 7.2 - Resume home meds    Hyperlipidemia - Lipitor   Hypertension - Amlodipine    CAD - Aspirin       HPI   Prior to Admission medications   Medication Sig Start Date End Date Taking? Authorizing Provider  albuterol  (PROVENTIL ) (2.5 MG/3ML) 0.083% nebulizer solution Take 3 mLs (2.5 mg total) by nebulization every 6 (six) hours as needed for wheezing or shortness of breath. 10/26/23  Yes Cele Mote, Emil Schanz, MD  albuterol  (VENTOLIN  HFA) 108 [phone] Base) MCG/ACT inhaler 2 inhalaciones Inhalaci n Cada 6 horas PRN, sibilancias, dificultad para respirar 12/16/23  Yes Rojelio Nest, DO  amLODipine  (NORVASC ) 5 MG tablet TOMAR 1 TABLETA POR VAI ORAL UNA VEZ AL DIA Patient taking differently: Take 5 mg by mouth in the morning. 12/07/22  Yes SagardiaEmil Schanz, MD  aspirin  EC 81 MG tablet Take 1 tablet (81 mg total) by mouth daily. Swallow whole. 09/01/22  Yes Magnant, Carlin CROME, PA-C  atorvastatin  (LIPITOR) 20 MG tablet Take 1 tablet (20 mg total) by mouth daily. TOMAR 1 TABLETA POR VIA ORAL UNA VEZ AL DIA 10/26/23  Yes Andersyn Fragoso, Emil Schanz, MD  Budeson-Glycopyrrol-Formoterol  (BREZTRI  AEROSPHERE) 160-9-4.8 MCG/ACT AERO INHALAR 2 BOCANADAS POR VIA ORAL DOS VECES AL DIA 06/16/23  Yes Kacie Huxtable, Emil Schanz, MD  cyclobenzaprine  (FLEXERIL ) 10 MG tablet  Take 1 tablet (10 mg total) by mouth 2 (two) times daily as needed for muscle spasms. 09/27/23  Yes Nivia Colon, PA-C  Dulaglutide  (TRULICITY ) 1.5 MG/0.5ML SOAJ INYECTAR CONTENIDOS DE UN LAPIZ POR VIA SUBCUTANEA CADA SEMANA, EL MISMO DIA CADA SEMANA Patient taking differently: Inject 1.5 mg  into the skin once a week. On Thursday 07/18/23  Yes Purcell Emil Schanz, MD  FARXIGA  10 MG TABS tablet TOMAR 1 TABLETA POR VIA ORAL UNA VEZ AL DIA 10/24/23  Yes Laure Leone, Emil Schanz, MD  gabapentin  (NEURONTIN ) 300 MG capsule Take 1 capsule (300 mg total) by mouth at bedtime as needed. Patient taking differently: Take 300 mg by mouth at bedtime. 09/24/23  Yes Magnant, Charles L, PA-C  glipiZIDE  (GLUCOTROL ) 10 MG tablet TOMAR 1 TABLETA POR VIA ORAL UNA VEZ AL DIA ANTES DEL DESAYUNO *TOMAR 1 TABLETA ADICIONAL SI AZUCAR EN LA SANGRE ESTA ALTA* 10/24/23  Yes Seanpatrick Maisano, Emil Schanz, MD  guaifenesin  (ROBITUSSIN) 100 MG/5ML syrup Take 200 mg by mouth 3 (three) times daily as needed for cough.   Yes [provider]  ibuprofen  (ADVIL ) 800 MG tablet Take 1 tablet (800 mg total) by mouth every 8 (eight) hours as needed. 08/25/23  Yes Magnant, Charles L, PA-C  insulin  aspart (NOVOLOG  FLEXPEN) 100 UNIT/ML FlexPen INYECTAR 3 UNIDADES POR VIA SUBCUTANEA TRES VECES AL DIA COMO INDICADO. AJUSTAR CANTIDAD DE INSULINA POR ESCALA MOVIL. MAS DOSIS 30 UNIDADES Patient taking differently: Inject 0-3 Units into the skin in the morning, at noon, and at bedtime. INYECTAR 3 UNIDADES POR VIA SUBCUTANEA TRES VECES AL DIA COMO INDICADO. AJUSTAR CANTIDAD DE INSULINA POR ESCALA MOVIL. MAS DOSIS 30 UNIDADES. 08/02/23  Yes Eleaner Dibartolo Jose, MD  mupirocin  ointment (BACTROBAN ) 2 % Apply 1 Application topically daily. 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  Follow discharge instructions on paperwork for duration Patient taking differently: Apply 1 Application topically daily as needed. 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  Follow discharge instructions on paperwork for duration 08/09/23  Yes Magnant, Charles L, PA-C  pantoprazole  (PROTONIX ) 40 MG tablet TOMAR 1 TABLETA POR VIA ORAL UNA VEZ AL DIA Patient taking differently: Take 40 mg by mouth in the morning. 01/09/23  Yes Rami Budhu, Emil Schanz, MD   predniSONE  (DELTASONE ) 10 MG tablet Take 4 tabs daily for 3 days, then 3 tabs daily for 3 days, then 2 tabs daily for 3 days, then 1 tab daily for 3 days, then 1/2 tab daily for 4 days. 12/16/23  Yes Rojelio Nest, DO  sildenafil  (VIAGRA ) 100 MG tablet Take 0.5-1 tablets (50-100 mg total) by mouth daily as needed for erectile dysfunction. 12/29/22  Yes Karlyn Glasco, Emil Schanz, MD  TRESIBA  FLEXTOUCH 100 UNIT/ML FlexTouch Pen INYECTAR 25 UNIDADES POR VIA SUBCUTANEA DIARIAMENTE Patient taking differently: Inject 30 Units into the skin in the morning. 04/10/23  Yes Purcell Emil Schanz, MD    No Known Allergies  Patient Active Problem List   Diagnosis Date Noted   Hypocalcemia 12/13/2023   COPD with acute exacerbation (HCC) 10/07/2023   Essential hypertension 10/07/2023   Arthritis of right shoulder region 08/20/2023   S/P reverse total shoulder arthroplasty, right 08/08/2023   Rotator cuff arthropathy, left 08/31/2022   S/P reverse total shoulder arthroplasty, left 08/30/2022   Intractable persistent migraine aura without cerebral infarction and with status migrainosus 05/26/2021   Hypertension associated with diabetes (HCC) 09/21/2020   Uncontrolled type 2 diabetes mellitus with hyperglycemia (HCC) 09/13/2020   CAD (coronary artery disease)  01/23/2020   HLD (hyperlipidemia) 11/02/2019   History of MI (myocardial infarction) 11/02/2019   COPD ? GOLD III/ active smoker 08/14/2018   Hypokalemia 09/23/2015   COPD (chronic obstructive pulmonary disease) (HCC) 10/14/2014   Dyslipidemia associated with type 2 diabetes mellitus (HCC) 10/14/2014    Past Medical History:  Diagnosis Date   Cerebral aneurysm, nonruptured    followed by dr n. 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   Coronary artery disease    (pt admitted for chest pain ) nuclear stress test 11-02-2019  low risk, nuclear ef 58%,  prior inferior  defect of severe severity indicative of previous MI;;   echo 04-28-2020 ef 60-65%, mild concentric LVH, RSVP 54.69mmHg;;   cardiac CTA 09-15-2020  unremarkable,  calcium  score=zero   Dependence on nocturnal oxygen  therapy    02-27-2023  per pt daughter,  pt only uses at night w/ O2 sat ,2-3L via [place],  only uses portable if going outside when every hot which is not much   Diverticulosis of colon    DOE (dyspnea on exertion)    02-27-2023  per pt daugter sob w/ stairs, incline's,  ok w/ some yard work if not warm/ hot wearther,  can do house hold chores   ED (erectile dysfunction)    Emphysema/COPD (HCC)    followed by pcp;   uses breztri  bid, uses preventil nebulizer every afternoon;   last exacerbation 06/ 2023 in epic   Full dentures    GERD (gastroesophageal reflux disease)    Hepatic steatosis    History of adenomatous polyp of colon    History of gastric ulcer    per EGD 09-16-2020  non-bleeding   History of MI (myocardial infarction)    per nuclear stress test in epic 11-02-2019 showed prior inferior defect of severe severity indicative of previous MI   History of pelvic fracture    1999  and 2009   Hyperlipidemia    Hypertension    Smokers' cough (HCC)    Type 2 diabetes mellitus treated with insulin  (HCC)    followed by pcp;  dx 2016   (02-27-2023  per pt daughter pt   Wears glasses     Past Surgical History:  Procedure Laterality Date   BICEPT TENODESIS Right 08/08/2023   Procedure: BICEPS TENODESIS;  Surgeon: Addie Cordella Hamilton, MD;  Location: [facility] OR;  Service: Orthopedics;  Laterality: Right;   BIOPSY  09/16/2020   Procedure: BIOPSY;  Surgeon: Eda Iha, MD;  Location: [facility];  Service: Gastroenterology;;   CHOLECYSTECTOMY N/A 03/20/2022   Procedure: LAPAROSCOPIC CHOLECYSTECTOMY;  Surgeon: Eletha Boas, MD;  Location: [facility];  Service: General;  Laterality: N/A;   COLONOSCOPY WITH PROPOFOL   05/10/2018   dr leigh   ESOPHAGOGASTRODUODENOSCOPY N/A 09/23/2015    Procedure: ESOPHAGOGASTRODUODENOSCOPY (EGD);  Surgeon: Gwendlyn ONEIDA Buddy, MD;  Location: [facility];  Service: Endoscopy;  Laterality: N/A;   ESOPHAGOGASTRODUODENOSCOPY (EGD) WITH PROPOFOL  N/A 09/16/2020   Procedure: ESOPHAGOGASTRODUODENOSCOPY (EGD) WITH PROPOFOL ;  Surgeon: Eda Iha, MD;  Location: [facility];  Service: Gastroenterology;  Laterality: N/A;   EXCISION MASS NECK Left 03/09/2023   Procedure: EXCISION SOFT TISSUE MASS LEFT POSTERIOR NECK;  Surgeon: Eletha Boas, MD;  Location: [facility];  Service: General;  Laterality: Left;   FOOT SURGERY Left 1999   INGUINAL HERNIA REPAIR Left 05/15/2018   Procedure: LEFT INGUINAL HERNIA REPAIR WITH MESH;  Surgeon: Vanderbilt Ned, MD;  Location: [facility] OR;  Service: General;  Laterality: Left;   INSERTION OF MESH Left 05/15/2018   Procedure: INSERTION OF MESH;  Surgeon: Vanderbilt Ned, MD;  Location: [facility] OR;  Service: General;  Laterality: Left;   INTRAOPERATIVE CHOLANGIOGRAM N/A 03/20/2022   Procedure: INTRAOPERATIVE CHOLANGIOGRAM;  Surgeon: Eletha Boas, MD;  Location: [facility];  Service: General;  Laterality: N/A;   REVERSE SHOULDER ARTHROPLASTY Left 08/30/2022   Procedure: LEFT REVERSE SHOULDER ARTHROPLASTY;  Surgeon: Addie Cordella Hamilton, MD;  Location: [facility] OR;  Service: Orthopedics;  Laterality: Left;   REVERSE SHOULDER ARTHROPLASTY Right 08/08/2023   Procedure: RIGHT REVERSE SHOULDER ARTHROPLASTY;  Surgeon: Addie Cordella Hamilton, MD;  Location[facility] OR;  Service: Orthopedics;  Laterality: Right;   SHOULDER ARTHROSCOPY W/ ROTATOR CUFF REPAIR Left    yrs ago    Social History   Socioeconomic History   Marital status: Married    Spouse name: Zoraida   Number of children: 5   Years of education: Not on file   Highest education level: 12th grade  Occupational History   Occupation: Employed    Associate Professor: [employer]  Tobacco Use   Smoking status: Former    Current packs/day: 0.00    Average packs/day: 0.2 packs/day for 54.0 years  (8.1 ttl pk-yrs)    Types: Cigarettes    Start date: 28    Quit date: 07/20/2023    Years since quitting: 0.4   Smokeless tobacco: Never   Tobacco comments:    02-27-2023  per pt daughter pt is still smoking, but hides how much per day,  smoking since age 89 (35)  Vaping Use   Vaping status: Never Used  Substance and Sexual Activity   Alcohol use: Not Currently    Alcohol/week: 2.0 standard drinks of alcohol    Types: 2 Cans of beer per week    Comment: 2 beers per week   Drug use: Never   Sexual activity: Yes  Other Topics Concern   Not on file  Social History Narrative   Lives with wife.  Employed full time as a Designer, fashion/clothing.  6 children.   Social Drivers of Health   Financial Resource Strain: Medium Risk (08/02/2023)   Overall Financial Resource Strain (CARDIA)    Difficulty of Paying Living Expenses: Somewhat hard  Food Insecurity: Patient Declined (12/13/2023)   Hunger Vital Sign    Worried About Running Out of Food in the Last Year: Patient declined    Ran Out of Food in the Last Year: Patient declined  Transportation Needs: Patient Declined (12/13/2023)   PRAPARE - Administrator, Civil Service (Medical): Patient declined    Lack of Transportation (Non-Medical): Patient declined  Physical Activity: Insufficiently Active (08/02/2023)   Exercise Vital Sign    Days of Exercise per Week: 1 day    Minutes of Exercise per Session: 10 min  Stress: No Stress Concern Present (08/02/2023)   [employer] of Occupational Health - Occupational Stress Questionnaire    Feeling of Stress : Not at all  Social Connections: Patient Declined (12/13/2023)   Social Connection and Isolation Panel    Frequency of Communication with Friends and Family: Patient declined    Frequency of Social Gatherings with Friends and Family: Patient declined    Attends Religious Services: Patient declined    Active Member of Clubs or Organizations: Patient declined    Attends Tax inspector Meetings: Patient declined    Marital Status: Patient declined  Intimate Partner Violence: Patient Declined (12/13/2023)   Humiliation,  Afraid, Rape, and Kick questionnaire    Fear of Current or Ex-Partner: Patient declined    Emotionally Abused: Patient declined    Physically Abused: Patient declined    Sexually Abused: Patient declined    Family History  Problem Relation Age of Onset   Emphysema Mother        passed away on July 21, 2023   Diabetes Mother    Emphysema Father    Cancer Sister        Stomach cancer; pt says that pt had 12 sisters but the one w/ stomach cancer passed away   Diabetes Sister    Stomach cancer Sister    Diabetes Brother    Colon cancer Neg Hx    Colon polyps Neg Hx    Esophageal cancer Neg Hx    Rectal cancer Neg Hx      Review of Systems  Constitutional: Negative.  Negative for chills and fever.  HENT: Negative.  Negative for congestion and sore throat.   Respiratory: Negative.  Negative for cough and shortness of breath.   Cardiovascular: Negative.  Negative for chest pain and palpitations.  Gastrointestinal:  Negative for abdominal pain, diarrhea, nausea and vomiting.  Genitourinary: Negative.  Negative for dysuria and hematuria.  Skin: Negative.  Negative for rash.  Neurological: Negative.  Negative for dizziness and headaches.  All other systems reviewed and are negative.   Vitals:   12/21/23 1308  BP: 114/70  Pulse: 100  Temp: 98.6 F (37 C)  SpO2: 93%    Physical Exam Vitals reviewed.  Constitutional:      Appearance: Normal appearance.  HENT:     Head: Normocephalic.     Mouth/Throat:     Mouth: Mucous membranes are moist.     Pharynx: Oropharynx is clear.   Eyes:     Extraocular Movements: Extraocular movements intact.     Conjunctiva/sclera: Conjunctivae normal.     Pupils: Pupils are equal, round, and reactive to light.    Cardiovascular:     Rate and Rhythm: Normal rate and regular rhythm.     Pulses:  Normal pulses.     Heart sounds: Normal heart sounds.  Pulmonary:     Effort: Pulmonary effort is normal.     Breath sounds: Normal breath sounds.  Abdominal:     Palpations: Abdomen is soft.     Tenderness: There is no abdominal tenderness.   Musculoskeletal:     Cervical back: No tenderness.  Lymphadenopathy:     Cervical: No cervical adenopathy.   Skin:    General: Skin is warm and dry.     Capillary Refill: Capillary refill takes less than 2 seconds.   Neurological:     Mental Status: He is alert and oriented to person, place, and time.   Psychiatric:        Mood and Affect: Mood normal.        Behavior: Behavior normal.     ASSESSMENT & PLAN: A total of 45 minutes was spent with the patient and counseling/coordination of care regarding preparing for this visit, review of most recent office visit notes, review of most recent hospital discharge summary, review of multiple chronic medical conditions and their management, review of all medications, review of most recent bloodwork results, review of health maintenance items, education on nutrition, prognosis, documentation, and need for follow up.   Problem List Items Addressed This Visit       Cardiovascular and Mediastinum   CAD (coronary artery disease)  Stable without recent anginal episodes Continue daily baby aspirin  and atorvastatin  20 mg daily      Hypertension associated with diabetes (HCC)   Lab Results  Component Value Date   HGBA1C 7.2 (H) 12/14/2023  Well-controlled hypertension.  Continue amlodipine  5 mg daily Well-controlled diabetes with hemoglobin A1c of 7.2 Diet and nutrition discussed Cardiovascular risks associated with hypertension and diabetes discussed Continue weekly Trulicity  1.5 mg, daily Farxiga  10 mg, glipizide  10 mg before breakfast, Premeal insulin  aspart as per sliding scale, and daily Tresiba  30 units Glucose numbers at home have been elevated due to corticosteroid use.  Has been  covering with short acting insulin  as per numbers.       Essential hypertension   BP Readings from Last 3 Encounters:  12/21/23 114/70  12/16/23 115/75  11/21/23 134/75  Well-controlled hypertension Continue amlodipine  5 mg daily         Respiratory   COPD (chronic obstructive pulmonary disease) (HCC) - Primary   With recent exacerbation secondary to respiratory virus Presently on oral corticosteroids Using inhalers as instructed Continues Breztri  2 inhalations twice a day and albuterol  inhaler as needed       Relevant Orders   Ambulatory referral to Pulmonology   Ambulatory referral to Pulmonology     Endocrine   Dyslipidemia associated with type 2 diabetes mellitus (HCC)   Chronic stable conditions Continue atorvastatin  20 mg daily Diet and nutrition discussed      Other Visit Diagnoses       Hospital discharge follow-up          Patient Instructions  Exacerbacin de la enfermedad pulmonar obstructiva crnica Chronic Obstructive Pulmonary Disease Exacerbation  La enfermedad pulmonar obstructiva crnica (EPOC) es un problema pulmonar de larga duracin (crnico). Cuando una persona tiene EPOC, puede resultarle ms difcil inhalar o exhalar. La exacerbacin de la EPOC es un agravamiento de los sntomas cuando la respiracin empeora y se necesita ms tratamiento. Sin tratamiento, las Associate Professor en peligro la vida. Si ocurren con frecuencia, los pulmones pueden daarse an ms. Cules son las causas? No tomar los medicamentos habituales para la EPOC segn las indicaciones del mdico. Un resfro o gripe, que puede causar [employer] . Exponerse a cosas que empeoran la respiracin, por ejemplo: Humo. Contaminacin del aire. Gases. Polvo. Alergias. Cambios climticos. Cules son los signos o sntomas? Los sntomas no mejoran o empeoran aunque tome los medicamentos segn las indicaciones del mdico. Entre los sntomas, se pueden  incluir los siguientes: Ms dificultad para Industrial/product designer. Es posible que solo pueda hablar una o dos palabras a la vez. Ms tos o mucosidad de los pulmones. Ms sibilancias u opresin en el pecho. Tener ms cansancio y [employer]. Confusin. Cmo se diagnostica? Esta afeccin se diagnostica en funcin de lo siguiente: Sntomas que empeoran. Sus antecedentes mdicos. Un examen fsico. Tambin pueden hacerle estudios, que incluyen los siguientes: Radiografa de [place]. Anlisis de sangre o de la mucosidad. Cmo se trata? Es posible que Pension scheme manager en su casa, o tal vez sea necesario que vaya al hospital. El tratamiento puede incluir: Usar medicamentos. Pueden incluir: Inhaladores. Estos contienen medicamentos que se inhalan. Pueden ser ms de los que ya usa  o pueden ser nuevos. Corticoesteroides. Reducen la inflamacin en las vas respiratorias. Pueden inhalarse, tomarse por la boca o administrarse a travs de una va intravenosa (IV). Antibiticos. Tratan las infecciones. Usar oxgeno. Usar un dispositivo que le ayude a eliminar la mucosidad. Siga estas indicaciones en su casa: Medicamentos Use  los medicamentos solamente como se lo haya indicado el mdico. Si le dieron antibiticos o corticoesteroides, tmelos o selos como se lo haya indicado el mdico. No deje de tomarlos aunque comience a sentirse mejor. Estilo de vida [employer] manos con agua y jabn durante al menos 20 segundos varias veces al [employer]. Utilice un desinfectante para manos si no dispone de agua y jabn. Esto puede ayudar a evitar que contraiga una infeccin. Evite estar cerca de multitudes o de personas enfermas. No fume ni consuma ningn producto que contenga nicotina o tabaco. Si necesita ayuda para dejar de consumir estos productos, consulte al mdico. Retome sus actividades normales cuando el mdico le autorice hacerlo. Use mtodos de respiracin para controlar el estrs y recuperar el aliento. Cmo se  previene? Siga el plan de accin para la EPOC. El plan de accin le indica qu hacer si se siente bien y qu hacer cuando comienza a Paediatric nurse. Analice el plan con frecuencia con su mdico. Asegrese de recibir todas las vacunas que el mdico le recomiende. Consulte al [employer] vacunas para la gripe y la neumona. Use la oxigenoterapia si el mdico se lo indica. Si necesita oxigenoterapia en su casa, pregntele al mdico con qu frecuencia debe controlar su nivel de oxgeno con un dispositivo llamado "oxmetro". Concurra a todas las visitas de seguimiento para revisar su plan de accin para la EPOC. El mdico querr controlar su afeccin con frecuencia para mantenerlo sano y fuera del hospital. Comunquese con un mdico si: Sus sntomas de la EPOC empeoran. Tiene fiebre o escalofros. Tiene dificultad para [employer] cotidianas. Tiene dificultad para respirar incluso cuando descansa. Solicite ayuda de inmediato si: Le falta el aire y no puede hacer lo siguiente: Expresarse con oraciones completas. Hacer actividades normales. Tiene dolor en el pecho. Se siente confundido. Estos sntomas pueden Customer service manager. Llame al 911 de inmediato. No espere a ver si los sntomas desaparecen. No conduzca por sus propios medios [employer]. Esta informacin no tiene Theme park manager el consejo del mdico. Asegrese de hacerle al mdico cualquier pregunta que tenga. Document Revised: 10/19/2022 Document Reviewed: 10/19/2022 Elsevier Patient Education  2024 Elsevier Inc.     Emil Schaumann, MD [facility]

## 2023-12-21 NOTE — Assessment & Plan Note (Signed)
 With recent exacerbation secondary to respiratory virus Presently on oral corticosteroids Using inhalers as instructed Continues Breztri  2 inhalations twice a day and albuterol  inhaler as needed

## 2023-12-21 NOTE — Assessment & Plan Note (Signed)
 Stable without recent anginal episodes Continue daily baby aspirin and atorvastatin 20 mg daily

## 2023-12-21 NOTE — Assessment & Plan Note (Signed)
 Lab Results  Component Value Date   HGBA1C 7.2 (H) 12/14/2023  Well-controlled hypertension.  Continue amlodipine  5 mg daily Well-controlled diabetes with hemoglobin A1c of 7.2 Diet and nutrition discussed Cardiovascular risks associated with hypertension and diabetes discussed Continue weekly Trulicity  1.5 mg, daily Farxiga  10 mg, glipizide  10 mg before breakfast, Premeal insulin  aspart as per sliding scale, and daily Tresiba  30 units Glucose numbers at home have been elevated due to corticosteroid use.  Has been covering with short acting insulin  as per numbers.

## 2023-12-21 NOTE — Assessment & Plan Note (Signed)
 BP Readings from Last 3 Encounters:  12/21/23 114/70  12/16/23 115/75  11/21/23 134/75  Well-controlled hypertension Continue amlodipine  5 mg daily

## 2023-12-22 ENCOUNTER — Ambulatory Visit: Attending: Orthopedic Surgery

## 2023-12-22 ENCOUNTER — Other Ambulatory Visit: Payer: Self-pay

## 2023-12-22 DIAGNOSIS — M542 Cervicalgia: Secondary | ICD-10-CM | POA: Insufficient documentation

## 2023-12-22 DIAGNOSIS — M5412 Radiculopathy, cervical region: Secondary | ICD-10-CM | POA: Insufficient documentation

## 2023-12-22 DIAGNOSIS — M6281 Muscle weakness (generalized): Secondary | ICD-10-CM | POA: Diagnosis present

## 2023-12-22 NOTE — Therapy (Incomplete)
 OUTPATIENT PHYSICAL THERAPY CERVICAL EVALUATION   Patient Name: Duane Cuevas MRN: [id number] DOB:06/09/1956, 68 y.o., male Today's Date: 12/22/2023  END OF SESSION:  PT End of Session - 12/22/23 1505     Visit Number 1    Number of Visits 13    Date for PT Re-Evaluation 02/09/24    Authorization Type [employer] MEDICARE HMO/PPO; MEDICAID OF [place]    PT Start Time 1230    PT Stop Time 1315    PT Time Calculation (min) 45 min    Activity Tolerance Patient tolerated treatment well    Behavior During Therapy WFL for tasks assessed/performed          Past Medical History:  Diagnosis Date   Cerebral aneurysm, nonruptured    followed by dr n. Nat Badger [facility] neurosurgery);  last MRI in epic 06-13-2022 stable 2 mm left paraophthalmia ICA intracerebral aneurysm   Coronary artery disease    (pt admitted for chest pain ) nuclear stress test 11-02-2019  low risk, nuclear ef 58%,  prior inferior defect of severe severity indicative of previous MI;;   echo 04-28-2020 ef 60-65%, mild concentric LVH, RSVP 54.42mmHg;;   cardiac CTA 09-15-2020  unremarkable,  calcium  score=zero   Dependence on nocturnal oxygen  therapy    02-27-2023  per pt daughter,  pt only uses at night w/ O2 sat ,2-3L via [place],  only uses portable if going outside when every hot which is not much   Diverticulosis of colon    DOE (dyspnea on exertion)    02-27-2023  per pt daugter sob w/ stairs, incline's,  ok w/ some yard work if not warm/ hot wearther,  can do house hold chores   ED (erectile dysfunction)    Emphysema/COPD (HCC)    followed by pcp;   uses breztri  bid, uses preventil nebulizer every afternoon;   last exacerbation 06/ 2023 in epic   Full dentures    GERD (gastroesophageal reflux disease)    Hepatic steatosis    History of adenomatous polyp of colon    History of gastric ulcer    per EGD 09-16-2020  non-bleeding   History of MI (myocardial infarction)    per nuclear stress test in epic 11-02-2019 showed  prior inferior defect of severe severity indicative of previous MI   History of pelvic fracture    1999  and 2009   Hyperlipidemia    Hypertension    Smokers' cough (HCC)    Type 2 diabetes mellitus treated with insulin  (HCC)    followed by pcp;  dx 2016   (02-27-2023  per pt daughter pt   Wears glasses    Past Surgical History:  Procedure Laterality Date   BICEPT TENODESIS Right 08/08/2023   Procedure: BICEPS TENODESIS;  Surgeon: Jasmine Mesi, MD;  Location: [facility] OR;  Service: Orthopedics;  Laterality: Right;   BIOPSY  09/16/2020   Procedure: BIOPSY;  Surgeon: Lindle Rhea, MD;  Location: [facility];  Service: Gastroenterology;;   CHOLECYSTECTOMY N/A 03/20/2022   Procedure: LAPAROSCOPIC CHOLECYSTECTOMY;  Surgeon: Oralee Billow, MD;  Location: [facility];  Service: General;  Laterality: N/A;   COLONOSCOPY WITH PROPOFOL   05/10/2018   dr General Kenner   ESOPHAGOGASTRODUODENOSCOPY N/A 09/23/2015   Procedure: ESOPHAGOGASTRODUODENOSCOPY (EGD);  Surgeon: Asencion Blacksmith, MD;  Location: [facility];  Service: Endoscopy;  Laterality: N/A;   ESOPHAGOGASTRODUODENOSCOPY (EGD) WITH PROPOFOL  N/A 09/16/2020   Procedure: ESOPHAGOGASTRODUODENOSCOPY (EGD) WITH PROPOFOL ;  Surgeon: Lindle Rhea, MD;  Location: [facility];  Service: Gastroenterology;  Laterality: N/A;  EXCISION MASS NECK Left 03/09/2023   Procedure: EXCISION SOFT TISSUE MASS LEFT POSTERIOR NECK;  Surgeon: Oralee Billow, MD;  Location: [facility];  Service: General;  Laterality: Left;   FOOT SURGERY Left 1999   INGUINAL HERNIA REPAIR Left 05/15/2018   Procedure: LEFT INGUINAL HERNIA REPAIR WITH MESH;  Surgeon: Sim Dryer, MD;  Location: [facility] OR;  Service: General;  Laterality: Left;   INSERTION OF MESH Left 05/15/2018   Procedure: INSERTION OF MESH;  Surgeon: Sim Dryer, MD;  Location: [facility];  Service: General;  Laterality: Left;   INTRAOPERATIVE CHOLANGIOGRAM N/A 03/20/2022   Procedure: INTRAOPERATIVE CHOLANGIOGRAM;  Surgeon:  Oralee Billow, MD;  Location: [facility];  Service: General;  Laterality: N/A;   REVERSE SHOULDER ARTHROPLASTY Left 08/30/2022   Procedure: LEFT REVERSE SHOULDER ARTHROPLASTY;  Surgeon: Jasmine Mesi, MD;  Location: [facility];  Service: Orthopedics;  Laterality: Left;   REVERSE SHOULDER ARTHROPLASTY Right 08/08/2023   Procedure: RIGHT REVERSE SHOULDER ARTHROPLASTY;  Surgeon: Jasmine Mesi, MD;  [facility];  Service: Orthopedics;  Laterality: Right;   SHOULDER ARTHROSCOPY W/ ROTATOR CUFF REPAIR Left    yrs ago   Patient Active Problem List   Diagnosis Date Noted   Hypocalcemia 12/13/2023   Essential hypertension 10/07/2023   Arthritis of right shoulder region 08/20/2023   S/P reverse total shoulder arthroplasty, 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 GOLD III/ active smoker 08/14/2018   Hypokalemia 09/23/2015   COPD (chronic obstructive pulmonary disease) (HCC) 10/14/2014   Dyslipidemia associated with type 2 diabetes mellitus (HCC) 10/14/2014   PCP: Elvira Hammersmith, MD   REFERRING PROVIDER: Jasmine Mesi, MD   REFERRING DIAG:  [phone] (ICD-10-CM) - Cervical radiculopathy  M54.2 (ICD-10-CM) - Neck pain    THERAPY DIAG:  Cervicalgia  Muscle weakness  Rationale for Evaluation and Treatment: Rehabilitation  ONSET DATE: Over 6 months  SUBJECTIVE:   SUBJECTIVE STATEMENT: Pt reports he has been experiencing neck, R shoulder pain and headaches for over 6 months. The pain is worse when trying to sleep. He has  mod pain with neck movements, and min to no pain when he hold his head still.  PERTINENT HISTORY: L and R rTSA, see PMH  PAIN:  Are you having pain? Yes: NPRS scale: 0/10 at rest, 4/10 with neck movement, and 10/10 with trying to sleep at night Pain location: neck, R shoulder pain and headaches Pain description: ache, pressure Aggravating factors: Sleeping, neck movements Relieving factors: Rest  PRECAUTIONS: None  RED FLAGS: None   WEIGHT BEARING RESTRICTIONS: No  FALLS:  Has patient fallen in last 6 months? No  LIVING ENVIRONMENT: Lives with: lives with their family Lives in: House/apartment Able to access home  OCCUPATION: Retired  PLOF: Independent  PATIENT GOALS: Pain relief  NEXT MD VISIT: Not scheduled  OBJECTIVE:  Note: Objective measures were completed at Evaluation unless otherwise noted.  DIAGNOSTIC FINDINGS:  IMPRESSION: 1. Chronic fusion across the C6-7 disc space. Mild narrowing of the ventral subarachnoid space but no compressive effect upon the cord. Mild bilateral foraminal stenosis, not grossly compressive. 2. C3-4: Bilateral uncovertebral  osteophytes. Bilateral foraminal stenosis could affect either C4 nerve. 3. C4-5: Endplate osteophytes and bulging of the disc. Bilateral uncovertebral osteophytes. Bilateral foraminal stenosis could affect either C5 nerve. 4. C5-6: Bilateral uncovertebral hypertrophy. Bilateral foraminal stenosis could affect either C6 nerve.  PATIENT SURVEYS:  NDI: 40/50=80%  COGNITION: Overall cognitive status: Within functional limits for tasks assessed  SENSATION: WFL  POSTURE: rounded shoulders, forward head, and decreased thoracic kyphosis  PALPATION: TTP to the upper traps and suboccipital muscles   CERVICAL ROM:   Active ROM A/PROM (deg) eval  Flexion 50 pressure  Extension 45  Right lateral flexion 30 pain/pressure  Left lateral flexion 40 pressure  Right rotation 30 pain/pressure  Left rotation 40  pressure   (Blank rows = not tested)  UPPER EXTREMITY ROM:  WFLs Active ROM Right eval Left eval  Shoulder flexion    Shoulder extension    Shoulder abduction    Shoulder adduction    Shoulder extension    Shoulder internal rotation    Shoulder external rotation    Elbow flexion    Elbow extension    Wrist flexion    Wrist extension    Wrist ulnar deviation    Wrist radial deviation    Wrist pronation    Wrist supination     (Blank rows = not tested)  UPPER EXTREMITY MMT: UE myotome negative MMT Right eval Left eval  Shoulder flexion    Shoulder extension    Shoulder abduction    Shoulder adduction    Shoulder extension    Shoulder internal rotation    Shoulder external rotation    Middle trapezius    Lower trapezius    Elbow flexion    Elbow extension    Wrist flexion    Wrist extension    Wrist ulnar deviation    Wrist radial deviation    Wrist pronation    Wrist supination    Grip strength     (Blank rows = not tested)  CERVICAL SPECIAL TESTS:  Spurling's test: Positive  TREATMENT DATE:  [facility] Adult PT Treatment:                                                DATE: 12/22/23 Therapeutic Exercise: *** Manual Therapy: *** Neuromuscular re-ed: *** Therapeutic Activity: *** Modalities: *** Self Care: ***                                                                                                                                 PATIENT EDUCATION:  Education details: *** Person educated: {Person educated:25204} Education method: {Education Method:25205} Education comprehension: {Education Comprehension:25206}  HOME EXERCISE PROGRAM: Access Code: [email or web address] URL: [email or web address] Date: 12/22/2023 Prepared by: Liborio Reeds  Exercises - Supine Cervical Retraction with Towel  - 3 x daily - 7 x  weekly - 1 sets - 10 reps - 3 hold - Supine DNF Liftoffs  - 3 x daily - 7 x weekly - 1 sets - 10 reps - 3  hold  ASSESSMENT:  CLINICAL IMPRESSION: Patient is a *** y.o. *** who was seen today for physical therapy evaluation and treatment for ***.   OBJECTIVE IMPAIRMENTS: {opptimpairments:25111}.   ACTIVITY LIMITATIONS: {activitylimitations:27494}  PARTICIPATION LIMITATIONS: {participationrestrictions:25113}  PERSONAL FACTORS: {Personal factors:25162} are also affecting patient's functional outcome.   REHAB POTENTIAL: {rehabpotential:25112}  CLINICAL DECISION MAKING: {clinical decision making:25114}  EVALUATION COMPLEXITY: {Evaluation complexity:25115}   GOALS: Goals reviewed with patient? {yes/no:20286}  SHORT TERM GOALS: Target date: ***  *** Baseline:  Goal status: INITIAL  2.  *** Baseline:  Goal status: INITIAL  3.  *** Baseline:  Goal status: INITIAL  4.  *** Baseline:  Goal status: INITIAL  5.  *** Baseline:  Goal status: INITIAL  6.  *** Baseline:  Goal status: INITIAL  LONG TERM GOALS: Target date: ***  *** Baseline:  Goal status: INITIAL  2.  *** Baseline:  Goal status: INITIAL  3.  *** Baseline:  Goal status: INITIAL  4.  *** Baseline:  Goal status: INITIAL  5.  *** Baseline:  Goal status: INITIAL  6.  *** Baseline:  Goal status: INITIAL   PLAN:  PT FREQUENCY: {rehab frequency:25116}  PT DURATION: {rehab duration:25117}  PLANNED INTERVENTIONS: {rehab planned interventions:25118::97110-Therapeutic exercises,97530- Therapeutic [phone]- Neuromuscular re-education,97535- Self IONG,29528- Manual therapy}  PLAN FOR NEXT SESSION: ***   Laisha Rau, PT 12/22/2023, 3:10 PM

## 2023-12-25 NOTE — Therapy (Incomplete)
 OUTPATIENT PHYSICAL THERAPY CERVICAL EVALUATION   Patient Name: Duane Cuevas MRN: [id number] DOB:Oct 02, 1955, 68 y.o., male Today's Date: 12/25/2023  END OF SESSION:    Past Medical History:  Diagnosis Date   Cerebral aneurysm, nonruptured    followed by dr n. 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   Coronary artery disease    (pt admitted for chest pain ) nuclear stress test 11-02-2019  low risk, nuclear ef 58%,  prior inferior defect of severe severity indicative of previous MI;;   echo 04-28-2020 ef 60-65%, mild concentric LVH, RSVP 54.76mmHg;;   cardiac CTA 09-15-2020  unremarkable,  calcium  score=zero   Dependence on nocturnal oxygen  therapy    02-27-2023  per pt daughter,  pt only uses at night w/ O2 sat ,2-3L via [place],  only uses portable if going outside when every hot which is not much   Diverticulosis of colon    DOE (dyspnea on exertion)    02-27-2023  per pt daugter sob w/ stairs, incline's,  ok w/ some yard work if not warm/ hot wearther,  can do house hold chores   ED (erectile dysfunction)    Emphysema/COPD (HCC)    followed by pcp;   uses breztri  bid, uses preventil nebulizer every afternoon;   last exacerbation 06/ 2023 in epic   Full dentures    GERD (gastroesophageal reflux disease)    Hepatic steatosis    History of adenomatous polyp of colon    History of gastric ulcer    per EGD 09-16-2020  non-bleeding   History of MI (myocardial infarction)    per nuclear stress test in epic 11-02-2019 showed prior inferior defect of severe severity indicative of previous MI   History of pelvic fracture    1999  and 2009   Hyperlipidemia    Hypertension    Smokers' cough (HCC)    Type 2 diabetes mellitus treated with insulin  (HCC)    followed by pcp;  dx 2016   (02-27-2023  per pt daughter pt   Wears glasses    Past Surgical History:  Procedure Laterality Date   BICEPT TENODESIS  Right 08/08/2023   Procedure: BICEPS TENODESIS;  Surgeon: Addie Cordella Hamilton, MD;  Location: [facility] OR;  Service: Orthopedics;  Laterality: Right;   BIOPSY  09/16/2020   Procedure: BIOPSY;  Surgeon: Eda Iha, MD;  Location: [facility];  Service: Gastroenterology;;   CHOLECYSTECTOMY N/A 03/20/2022   Procedure: LAPAROSCOPIC CHOLECYSTECTOMY;  Surgeon: Eletha Boas, MD;  Location: [facility];  Service: General;  Laterality: N/A;   COLONOSCOPY WITH PROPOFOL   05/10/2018   dr leigh   ESOPHAGOGASTRODUODENOSCOPY N/A 09/23/2015   Procedure: ESOPHAGOGASTRODUODENOSCOPY (EGD);  Surgeon: Gwendlyn ONEIDA Buddy, MD;  Location: [facility];  Service: Endoscopy;  Laterality: N/A;   ESOPHAGOGASTRODUODENOSCOPY (EGD) WITH PROPOFOL  N/A 09/16/2020   Procedure: ESOPHAGOGASTRODUODENOSCOPY (EGD) WITH PROPOFOL ;  Surgeon: Eda Iha, MD;  Location: [facility];  Service: Gastroenterology;  Laterality: N/A;   EXCISION MASS NECK Left 03/09/2023   Procedure: EXCISION SOFT TISSUE MASS LEFT POSTERIOR NECK;  Surgeon: Eletha Boas, MD;  Location: [facility];  Service: General;  Laterality: Left;   FOOT SURGERY Left 1999   INGUINAL HERNIA REPAIR Left 05/15/2018   Procedure: LEFT INGUINAL HERNIA REPAIR WITH MESH;  Surgeon: Vanderbilt Ned, MD;  Location: [facility];  Service: General;  Laterality: Left;   INSERTION OF MESH Left 05/15/2018   Procedure: INSERTION OF MESH;  Surgeon: Vanderbilt Ned, MD;  Location:  [facility];  Service: General;  Laterality: Left;   INTRAOPERATIVE CHOLANGIOGRAM N/A 03/20/2022   Procedure: INTRAOPERATIVE CHOLANGIOGRAM;  Surgeon: Eletha Boas, MD;  Location: [facility];  Service: General;  Laterality: N/A;   REVERSE SHOULDER ARTHROPLASTY Left 08/30/2022   Procedure: LEFT REVERSE SHOULDER ARTHROPLASTY;  Surgeon: Addie Cordella Hamilton, MD;  Location: [facility];  Service: Orthopedics;  Laterality: Left;   REVERSE SHOULDER ARTHROPLASTY Right 08/08/2023   Procedure: RIGHT REVERSE SHOULDER ARTHROPLASTY;  Surgeon: Addie Cordella Hamilton, MD;  Location: [facility] OR;  Service: Orthopedics;  Laterality: Right;   SHOULDER ARTHROSCOPY W/ ROTATOR CUFF REPAIR Left    yrs ago   Patient Active Problem List   Diagnosis Date Noted   Hypocalcemia 12/13/2023   Essential hypertension 10/07/2023   Arthritis of right shoulder region 08/20/2023   S/P reverse total shoulder arthroplasty, 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 GOLD III/ active smoker 08/14/2018   Hypokalemia 09/23/2015   COPD (chronic obstructive pulmonary disease) (HCC) 10/14/2014   Dyslipidemia associated with type 2 diabetes mellitus (HCC) 10/14/2014   PCP: Purcell Emil Schanz, MD   REFERRING PROVIDER: Addie Cordella Hamilton, MD   REFERRING DIAG:  [phone] (ICD-10-CM) - Cervical radiculopathy  M54.2 (ICD-10-CM) - Neck pain    THERAPY DIAG:  Cervicalgia  Muscle weakness  Rationale for Evaluation and Treatment: Rehabilitation  ONSET DATE: Over 6 months  SUBJECTIVE:   SUBJECTIVE STATEMENT: EVAL: Pt reports he has been experiencing neck, R shoulder pain and headaches for over 6 months. The pain is worse when trying to sleep and he has mod pain with neck movements, and min to no pain when he holds his head still.  PERTINENT HISTORY: L and R rTSA, see PMH  PAIN:  Are you having pain? Yes: NPRS scale: 0/10 at rest, 4/10 with neck movement, and 10/10 with trying to sleep at night Pain location: neck, R shoulder pain and headaches Pain description: ache, pressure Aggravating factors: Sleeping, neck  movements Relieving factors: Rest  PRECAUTIONS: None  RED FLAGS: None   WEIGHT BEARING RESTRICTIONS: No  FALLS:  Has patient fallen in last 6 months? No  LIVING ENVIRONMENT: Lives with: lives with their family Lives in: House/apartment Able to access home  OCCUPATION: Retired  PLOF: Independent  PATIENT GOALS: Pain relief  NEXT MD VISIT: Not scheduled  OBJECTIVE:  Note: Objective measures were completed at Evaluation unless otherwise noted.  DIAGNOSTIC FINDINGS:  IMPRESSION: 1. Chronic fusion across the C6-7 disc space. Mild narrowing of the ventral subarachnoid space but no compressive effect upon the cord. Mild bilateral foraminal stenosis, not grossly compressive. 2. C3-4: Bilateral uncovertebral osteophytes. Bilateral foraminal stenosis could affect either C4 nerve. 3. C4-5: Endplate osteophytes and bulging of the disc. Bilateral uncovertebral osteophytes. Bilateral foraminal stenosis could affect either C5 nerve. 4. C5-6: Bilateral uncovertebral hypertrophy. Bilateral foraminal stenosis could affect either C6 nerve.  PATIENT SURVEYS:  NDI: 40/50=80%  COGNITION: Overall cognitive status: Within functional limits for tasks assessed  SENSATION: WFL  POSTURE: rounded shoulders, forward head, and decreased thoracic kyphosis  PALPATION: TTP to the upper traps and suboccipital muscles   CERVICAL  ROM:   Active ROM A/PROM (deg) eval  Flexion 50 pressure  Extension 45  Right lateral flexion 30 pain/pressure  Left lateral flexion 40 pressure  Right rotation 30 pain/pressure  Left rotation 40 pressure   (Blank rows = not tested)  UPPER EXTREMITY ROM:  WFLs Active ROM Right eval Left eval  Shoulder flexion    Shoulder extension    Shoulder abduction    Shoulder adduction    Shoulder extension    Shoulder internal rotation    Shoulder external rotation    Elbow flexion    Elbow extension    Wrist flexion    Wrist extension    Wrist ulnar  deviation    Wrist radial deviation    Wrist pronation    Wrist supination     (Blank rows = not tested)  UPPER EXTREMITY MMT: UE myotome negative MMT Right eval Left eval  Shoulder flexion    Shoulder extension    Shoulder abduction    Shoulder adduction    Shoulder extension    Shoulder internal rotation    Shoulder external rotation    Middle trapezius    Lower trapezius    Elbow flexion    Elbow extension    Wrist flexion    Wrist extension    Wrist ulnar deviation    Wrist radial deviation    Wrist pronation    Wrist supination    Grip strength 25/30=27.5 60/70=65   (Blank rows = not tested)  CERVICAL SPECIAL TESTS:  Spurling's test: Positive  TREATMENT DATE:  [facility] Adult PT Treatment:                                                DATE: 12/26/23 Therapeutic Exercise: *** Manual Therapy: *** Neuromuscular re-ed: *** Therapeutic Activity: *** Modalities: *** Self Care: ***   [facility] Adult PT Treatment:                                                DATE: 12/22/23 Therapeutic Exercise: Developed, instructed in, and pt completed therex as noted in HEP  Self Care: Sleeping positions and neck support for comfort                                                                                                                                 PATIENT EDUCATION:  Education details: Eval findings, POC, HEP, self care  Person educated: Patient Education method: Explanation, Demonstration, Tactile cues, Verbal cues, and Handouts Education comprehension: verbalized understanding, returned demonstration, verbal cues required, and tactile cues required  HOME EXERCISE PROGRAM: Access Code: [email or web address] URL: [email or web address] Date: 12/22/2023 Prepared by: Dasie Daft  Exercises -  Supine Cervical Retraction with Towel  - 3 x daily - 7 x weekly - 1 sets - 10 reps - 3 hold - Supine DNF Liftoffs  - 3 x daily - 7 x weekly - 1 sets - 10 reps - 3  hold  ASSESSMENT:  CLINICAL IMPRESSION: EVAL: Patient is a 67 y.o. male who was seen today for physical therapy evaluation and treatment for  M54.12 (ICD-10-CM) - Cervical radiculopathy  M54.2 (ICD-10-CM) - Neck pain  Pt presents with decreased neck ROM and significantly increased pain when trying to sleep and with neck movements. Pain is minimal or resolves when holding his head still. Pt experiences radicular type pain to his R upper shoulder. UE myotome screen was negative although R grip strength is significantly less than the L. Pt reports pain with R hand grip strength testing. Cervical MRI reveals marked degenerative changes. A HEP was initiated to address posture, and postural/posterior chain strengthening. Pt reported greater ease of neck mobility after completing the HEP. Pt will benefit from skilled PT to address impairments to optimize function with less pain.   OBJECTIVE IMPAIRMENTS: decreased activity tolerance, decreased mobility, decreased ROM, decreased strength, increased muscle spasms, postural dysfunction, and pain.   ACTIVITY LIMITATIONS: lifting, sleeping, reach over head, and caring for others  PARTICIPATION LIMITATIONS: cleaning, community activity, and yard work  PERSONAL FACTORS: Past/current experiences, Time since onset of injury/illness/exacerbation, and 1 comorbidity: bilat rTSA are also affecting patient's functional outcome.   REHAB POTENTIAL: Good  CLINICAL DECISION MAKING: Evolving/moderate complexity  EVALUATION COMPLEXITY: Moderate   GOALS:  SHORT TERM GOALS = LTGs  LONG TERM GOALS: Target date: 02/09/24  Pt will be Ind in a final HEP to maintain achieved LOF Baseline: started Goal status: INITIAL  2.  Pt will report 50% or greater improvement in his neck pain with neck movements and with sleeping Baseline: 4-10/10 Goal status: INITIAL  3.  Increase neck rotation by 10d for improved mobility with driving and daily activities Baseline:  Goal  status: INITIAL  4.  Increase R grip strength by 10# as indication of improved pain and for improved function Baseline: 27.5# Goal status: INITIAL  5.  Pt's NDI score will improve by the MCID to 65% as indication of improved function  Baseline:80%  Goal status: INITIAL     PLAN:  PT FREQUENCY: 2x/week  PT DURATION: 6 weeks  PLANNED INTERVENTIONS: 97164- PT Re-evaluation, 97110-Therapeutic exercises, 97530- Therapeutic activity, 97112- Neuromuscular re-education, 97535- Self Care, 02859- Manual therapy, [id number]- Electrical stimulation (unattended), 02987- Traction (mechanical), 20560 (1-2 muscles), 20561 (3+ muscles)- Dry Needling, Patient/Family education, Taping, Joint mobilization, Spinal mobilization, Cryotherapy, and Moist heat  PLAN FOR NEXT SESSION: Assess response to HEP; progress therex as indicated; use of modalities, manual therapy; and TPDN as indicated.  Evita Merida MS, PT 12/25/23 10:19 PM

## 2023-12-26 ENCOUNTER — Ambulatory Visit

## 2023-12-28 NOTE — Therapy (Signed)
 OUTPATIENT PHYSICAL THERAPY CERVICAL EVALUATION   Patient Name: Duane Cuevas MRN: [id number] DOB:[birth year], 68 y.o., male Today's Date: 12/29/2023  END OF SESSION:  PT End of Session - 12/29/23 0729     Visit Number 2    Number of Visits 13    Date for PT Re-Evaluation 02/09/24    Authorization Type [employer] MEDICARE HMO/PPO; MEDICAID OF [place]    PT Start Time 0720    PT Stop Time 0800    PT Time Calculation (min) 40 min    Activity Tolerance Patient tolerated treatment well    Behavior During Therapy WFL for tasks assessed/performed           Past Medical History:  Diagnosis Date   Cerebral aneurysm, nonruptured    followed by dr n. 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   Coronary artery disease    (pt admitted for chest pain ) nuclear stress test 11-02-2019  low risk, nuclear ef 58%,  prior inferior defect of severe severity indicative of previous MI;;   echo 04-28-2020 ef 60-65%, mild concentric LVH, RSVP 54.69mmHg;;   cardiac CTA 09-15-2020  unremarkable,  calcium  score=zero   Dependence on nocturnal oxygen  therapy    02-27-2023  per pt daughter,  pt only uses at night w/ O2 sat ,2-3L via [place],  only uses portable if going outside when every hot which is not much   Diverticulosis of colon    DOE (dyspnea on exertion)    02-27-2023  per pt daugter sob w/ stairs, incline's,  ok w/ some yard work if not warm/ hot wearther,  can do house hold chores   ED (erectile dysfunction)    Emphysema/COPD (HCC)    followed by pcp;   uses breztri  bid, uses preventil nebulizer every afternoon;   last exacerbation 06/ 2023 in epic   Full dentures    GERD (gastroesophageal reflux disease)    Hepatic steatosis    History of adenomatous polyp of colon    History of gastric ulcer    per EGD 09-16-2020  non-bleeding   History of MI (myocardial infarction)    per nuclear stress test in epic 11-02-2019 showed  prior inferior defect of severe severity indicative of previous MI   History of pelvic fracture    1999  and 2009   Hyperlipidemia    Hypertension    Smokers' cough (HCC)    Type 2 diabetes mellitus treated with insulin  (HCC)    followed by pcp;  dx 2016   (02-27-2023  per pt daughter pt   Wears glasses    Past Surgical History:  Procedure Laterality Date   BICEPT TENODESIS Right 08/08/2023   Procedure: BICEPS TENODESIS;  Surgeon: Addie Cordella Hamilton, MD;  Location: [facility] OR;  Service: Orthopedics;  Laterality: Right;   BIOPSY  09/16/2020   Procedure: BIOPSY;  Surgeon: Eda Iha, MD;  Location: [facility];  Service: Gastroenterology;;   CHOLECYSTECTOMY N/A 03/20/2022   Procedure: LAPAROSCOPIC CHOLECYSTECTOMY;  Surgeon: Eletha Boas, MD;  Location: [facility];  Service: General;  Laterality: N/A;   COLONOSCOPY WITH PROPOFOL   05/10/2018   dr leigh   ESOPHAGOGASTRODUODENOSCOPY N/A 09/23/2015   Procedure: ESOPHAGOGASTRODUODENOSCOPY (EGD);  Surgeon: Gwendlyn ONEIDA Buddy, MD;  Location: [facility];  Service: Endoscopy;  Laterality: N/A;   ESOPHAGOGASTRODUODENOSCOPY (EGD) WITH PROPOFOL  N/A 09/16/2020   Procedure: ESOPHAGOGASTRODUODENOSCOPY (EGD) WITH PROPOFOL ;  Surgeon: Eda Iha, MD;  Location: [facility];  Service: Gastroenterology;  Laterality:  N/A;   EXCISION MASS NECK Left 03/09/2023   Procedure: EXCISION SOFT TISSUE MASS LEFT POSTERIOR NECK;  Surgeon: Eletha Boas, MD;  Location: [facility];  Service: General;  Laterality: Left;   FOOT SURGERY Left 1999   INGUINAL HERNIA REPAIR Left 05/15/2018   Procedure: LEFT INGUINAL HERNIA REPAIR WITH MESH;  Surgeon: Vanderbilt Ned, MD;  Location: [facility] OR;  Service: General;  Laterality: Left;   INSERTION OF MESH Left 05/15/2018   Procedure: INSERTION OF MESH;  Surgeon: Vanderbilt Ned, MD;  Location: [facility];  Service: General;  Laterality: Left;   INTRAOPERATIVE CHOLANGIOGRAM N/A 03/20/2022   Procedure: INTRAOPERATIVE CHOLANGIOGRAM;  Surgeon:  Eletha Boas, MD;  Location: [facility];  Service: General;  Laterality: N/A;   REVERSE SHOULDER ARTHROPLASTY Left 08/30/2022   Procedure: LEFT REVERSE SHOULDER ARTHROPLASTY;  Surgeon: Addie Cordella Hamilton, MD;  Location: [facility] OR;  Service: Orthopedics;  Laterality: Left;   REVERSE SHOULDER ARTHROPLASTY Right 08/08/2023   Procedure: RIGHT REVERSE SHOULDER ARTHROPLASTY;  Surgeon: Addie Cordella Hamilton, MD;  Location: [facility] OR;  Service: Orthopedics;  Laterality: Right;   SHOULDER ARTHROSCOPY W/ ROTATOR CUFF REPAIR Left    yrs ago   Patient Active Problem List   Diagnosis Date Noted   Hypocalcemia 12/13/2023   Essential hypertension 10/07/2023   Arthritis of right shoulder region 08/20/2023   S/P reverse total shoulder arthroplasty, 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 GOLD III/ active smoker 08/14/2018   Hypokalemia 09/23/2015   COPD (chronic obstructive pulmonary disease) (HCC) 10/14/2014   Dyslipidemia associated with type 2 diabetes mellitus (HCC) 10/14/2014   PCP: Purcell Emil Schanz, MD   REFERRING PROVIDER: Addie Cordella Hamilton, MD   REFERRING DIAG:  [phone] (ICD-10-CM) - Cervical radiculopathy  M54.2 (ICD-10-CM) - Neck pain    THERAPY DIAG:  Cervicalgia  Muscle weakness  Rationale for Evaluation and Treatment: Rehabilitation  ONSET DATE: Over 6 months  SUBJECTIVE:   SUBJECTIVE STATEMENT: Pt reports his neck pain is a little better. Pt takes oxycodone  to manage the pain. Pt reports pain of his L ear area which can be very  high. It occurs about 2x a week.   EVAL: Pt reports he has been experiencing neck, R shoulder pain and headaches for over 6 months. The pain is worse when trying to sleep and he has mod pain with neck movements, and min to no pain when he holds his head still.  PERTINENT HISTORY: L and R rTSA, see PMH  PAIN:  Are you having pain? Yes: NPRS scale: 0/10 at rest, 4/10 with neck movement, and 10/10 with trying to sleep at night Pain location: neck, R shoulder pain and headaches Pain description: ache, pressure Aggravating factors: Sleeping, neck movements Relieving factors: Rest  PRECAUTIONS: None  RED FLAGS: None   WEIGHT BEARING RESTRICTIONS: No  FALLS:  Has patient fallen in last 6 months? No  LIVING ENVIRONMENT: Lives with: lives with their family Lives in: House/apartment Able to access home  OCCUPATION: Retired  PLOF: Independent  PATIENT GOALS: Pain relief  NEXT MD VISIT: Not scheduled  OBJECTIVE:  Note: Objective measures were completed at  Evaluation unless otherwise noted.  DIAGNOSTIC FINDINGS:  IMPRESSION: 1. Chronic fusion across the C6-7 disc space. Mild narrowing of the ventral subarachnoid space but no compressive effect upon the cord. Mild bilateral foraminal stenosis, not grossly compressive. 2. C3-4: Bilateral uncovertebral osteophytes. Bilateral foraminal stenosis could affect either C4 nerve. 3. C4-5: Endplate osteophytes and bulging of the disc. Bilateral uncovertebral osteophytes. Bilateral foraminal stenosis could affect either C5 nerve. 4. C5-6: Bilateral uncovertebral hypertrophy. Bilateral foraminal stenosis could affect either C6 nerve.  PATIENT SURVEYS:  NDI: 40/50=80%  COGNITION: Overall cognitive status: Within functional limits for tasks assessed  SENSATION: WFL  POSTURE: rounded shoulders, forward head, and decreased thoracic kyphosis  PALPATION: TTP to the upper traps and suboccipital muscles   CERVICAL ROM:   Active ROM  A/PROM (deg) eval  Flexion 50 pressure  Extension 45  Right lateral flexion 30 pain/pressure  Left lateral flexion 40 pressure  Right rotation 30 pain/pressure  Left rotation 40 pressure   (Blank rows = not tested)  UPPER EXTREMITY ROM:  WFLs Active ROM Right eval Left eval  Shoulder flexion    Shoulder extension    Shoulder abduction    Shoulder adduction    Shoulder extension    Shoulder internal rotation    Shoulder external rotation    Elbow flexion    Elbow extension    Wrist flexion    Wrist extension    Wrist ulnar deviation    Wrist radial deviation    Wrist pronation    Wrist supination     (Blank rows = not tested)  UPPER EXTREMITY MMT: UE myotome negative MMT Right eval Left eval  Shoulder flexion    Shoulder extension    Shoulder abduction    Shoulder adduction    Shoulder extension    Shoulder internal rotation    Shoulder external rotation    Middle trapezius    Lower trapezius    Elbow flexion    Elbow extension    Wrist flexion    Wrist extension    Wrist ulnar deviation    Wrist radial deviation    Wrist pronation    Wrist supination    Grip strength 25/30=27.5 60/70=65   (Blank rows = not tested)  CERVICAL SPECIAL TESTS:  Spurling's test: Positive  TREATMENT DATE:  [facility] Adult PT Treatment:                                                DATE: 12/29/23 Manual Therapy: STM to the upper trap, levator, cervical paraspinals, and suboccipitals UPAs C2-C6 Therapeutic Exercise: Supine chin tucks x10 3 Supine DNF lifts x10 3 Supine cervical rotation x5 5 Seated chin tuck c cervical SB x4 5 Shoulder row 2x12 GTB  [facility] Adult PT Treatment:                                                DATE: 12/22/23 Therapeutic Exercise: Developed, instructed in, and pt completed therex as noted in HEP  Self Care: Sleeping positions and neck support for comfort  PATIENT EDUCATION:  Education details: Eval findings, POC, HEP, self care  Person educated: Patient Education method: Explanation, Demonstration, Tactile cues, Verbal cues, and Handouts Education comprehension: verbalized understanding, returned demonstration, verbal cues required, and tactile cues required  HOME EXERCISE PROGRAM: Access Code: [email or web address] URL: [email or web address] Date: 12/29/2023 Prepared by: Dasie Daft  Exercises - Supine Cervical Retraction with Towel  - 2 x daily - 7 x weekly - 1 sets - 10 reps - 3 hold - Supine DNF Liftoffs  - 2 x daily - 7 x weekly - 1 sets - 10 reps - 3 hold - Supine Cervical Rotation AROM on Pillow  - 2 x daily - 7 x weekly - 1 sets - 5 reps - 5 hold - Standing Cervical Retraction with Sidebending  - 2 x daily - 7 x weekly - 1 sets - 10 reps - 5 hold - Standing Shoulder Row with Anchored Resistance  - 12 x daily - 7 x weekly - 2 sets - 10 reps - 3 hold  ASSESSMENT:  CLINICAL IMPRESSION: Pt reports completing his HEP 2x daily. Additional exercises were added to address neck ROM and posterior chain strengthening. Pt completed his HEP properly. Prior to exercises, manual therapy was provided as noted above. Pt reports significant tenderness of the L suboccipital area. Pt tolerated PT today without adverse effects. Pt will continue to benefit from skilled PT to address impairments for improved neck function with minimized pain.    EVAL: Patient is a 68 y.o. male who was seen today for physical therapy evaluation and treatment for  M54.12 (ICD-10-CM) - Cervical radiculopathy  M54.2 (ICD-10-CM) - Neck pain  Pt presents with decreased neck ROM and significantly increased pain when trying to sleep and with neck movements. Pain is minimal or resolves when holding his head still. Pt experiences radicular type pain to his R upper shoulder. UE myotome screen was negative although R grip strength is  significantly less than the L. Pt reports pain with R hand grip strength testing. Cervical MRI reveals marked degenerative changes. A HEP was initiated to address posture, and postural/posterior chain strengthening. Pt reported greater ease of neck mobility after completing the HEP. Pt will benefit from skilled PT to address impairments to optimize function with less pain.   OBJECTIVE IMPAIRMENTS: decreased activity tolerance, decreased mobility, decreased ROM, decreased strength, increased muscle spasms, postural dysfunction, and pain.   ACTIVITY LIMITATIONS: lifting, sleeping, reach over head, and caring for others  PARTICIPATION LIMITATIONS: cleaning, community activity, and yard work  PERSONAL FACTORS: Past/current experiences, Time since onset of injury/illness/exacerbation, and 1 comorbidity: bilat rTSA are also affecting patient's functional outcome.   REHAB POTENTIAL: Good  CLINICAL DECISION MAKING: Evolving/moderate complexity  EVALUATION COMPLEXITY: Moderate   GOALS:  SHORT TERM GOALS = LTGs  LONG TERM GOALS: Target date: 02/09/24  Pt will be Ind in a final HEP to maintain achieved LOF Baseline: started Goal status: Ongoing  2.  Pt will report 50% or greater improvement in his neck pain with neck movements and with sleeping Baseline: 4-10/10 Goal status: INITIAL  3.  Increase neck rotation by 10d for improved mobility with driving and daily activities Baseline:  Goal status: INITIAL  4.  Increase R grip strength by 10# as indication of improved pain and for improved function Baseline: 27.5# Goal status: INITIAL  5.  Pt's NDI score will improve by the MCID to 65% as indication of improved function  Baseline:80%  Goal status: INITIAL     PLAN:  PT  FREQUENCY: 2x/week  PT DURATION: 6 weeks  PLANNED INTERVENTIONS: 97164- PT Re-evaluation, 97110-Therapeutic exercises, 97530- Therapeutic activity, [id number]- Neuromuscular re-education, 97535- Self Care, [id number]- Manual  therapy, [id number]- Electrical stimulation (unattended), 97012- Traction (mechanical), 20560 (1-2 muscles), 20561 (3+ muscles)- Dry Needling, Patient/Family education, Taping, Joint mobilization, Spinal mobilization, Cryotherapy, and Moist heat  PLAN FOR NEXT SESSION: Assess response to HEP; progress therex as indicated; use of modalities, manual therapy; and TPDN as indicated.  Teresia Myint MS, PT 12/29/23 8:39 AM

## 2023-12-29 ENCOUNTER — Ambulatory Visit

## 2023-12-29 DIAGNOSIS — M6281 Muscle weakness (generalized): Secondary | ICD-10-CM

## 2023-12-29 DIAGNOSIS — M542 Cervicalgia: Secondary | ICD-10-CM

## 2024-01-02 NOTE — Therapy (Signed)
 OUTPATIENT PHYSICAL THERAPY CERVICAL EVALUATION   Patient Name: Duane Cuevas MRN: [id number] DOB:07-22-1955, 68 y.o., male Today's Date: 01/03/2024  END OF SESSION:  PT End of Session - 01/03/24 1148     Visit Number 3    Number of Visits 13    Date for PT Re-Evaluation 02/09/24    Authorization Type [employer] MEDICARE HMO/PPO; MEDICAID OF [place]    PT Start Time 1149    PT Stop Time 1231    PT Time Calculation (min) 42 min    Activity Tolerance Patient tolerated treatment well    Behavior During Therapy WFL for tasks assessed/performed            Past Medical History:  Diagnosis Date   Cerebral aneurysm, nonruptured    followed by dr n. 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   Coronary artery disease    (pt admitted for chest pain ) nuclear stress test 11-02-2019  low risk, nuclear ef 58%,  prior inferior defect of severe severity indicative of previous MI;;   echo 04-28-2020 ef 60-65%, mild concentric LVH, RSVP 54.91mmHg;;   cardiac CTA 09-15-2020  unremarkable,  calcium  score=zero   Dependence on nocturnal oxygen  therapy    02-27-2023  per pt daughter,  pt only uses at night w/ O2 sat ,2-3L via ,  only uses portable if going outside when every hot which is not much   Diverticulosis of colon    DOE (dyspnea on exertion)    02-27-2023  per pt daugter sob w/ stairs, incline's,  ok w/ some yard work if not warm/ hot wearther,  can do house hold chores   ED (erectile dysfunction)    Emphysema/COPD (HCC)    followed by pcp;   uses breztri  bid, uses preventil nebulizer every afternoon;   last exacerbation 06/ 2023 in epic   Full dentures    GERD (gastroesophageal reflux disease)    Hepatic steatosis    History of adenomatous polyp of colon    History of gastric ulcer    per EGD 09-16-2020  non-bleeding   History of MI (myocardial infarction)    per nuclear stress test in epic 11-02-2019 showed  prior inferior defect of severe severity indicative of previous MI   History of pelvic fracture    1999  and 2009   Hyperlipidemia    Hypertension    Smokers' cough (HCC)    Type 2 diabetes mellitus treated with insulin  (HCC)    followed by pcp;  dx 2016   (02-27-2023  per pt daughter pt   Wears glasses    Past Surgical History:  Procedure Laterality Date   BICEPT TENODESIS Right 08/08/2023   Procedure: BICEPS TENODESIS;  Surgeon: Addie Cordella Hamilton, MD;  Location: [facility] OR;  Service: Orthopedics;  Laterality: Right;   BIOPSY  09/16/2020   Procedure: BIOPSY;  Surgeon: Eda Iha, MD;  Location: [facility];  Service: Gastroenterology;;   CHOLECYSTECTOMY N/A 03/20/2022   Procedure: LAPAROSCOPIC CHOLECYSTECTOMY;  Surgeon: Eletha Boas, MD;  Location: [facility];  Service: General;  Laterality: N/A;   COLONOSCOPY WITH PROPOFOL   05/10/2018   dr leigh   ESOPHAGOGASTRODUODENOSCOPY N/A 09/23/2015   Procedure: ESOPHAGOGASTRODUODENOSCOPY (EGD);  Surgeon: Gwendlyn ONEIDA Buddy, MD;  Location: [facility];  Service: Endoscopy;  Laterality: N/A;   ESOPHAGOGASTRODUODENOSCOPY (EGD) WITH PROPOFOL  N/A 09/16/2020   Procedure: ESOPHAGOGASTRODUODENOSCOPY (EGD) WITH PROPOFOL ;  Surgeon: Eda Iha, MD;  Location: [facility];  Service: Gastroenterology;  Laterality: N/A;   EXCISION MASS NECK Left 03/09/2023   Procedure: EXCISION SOFT TISSUE MASS LEFT POSTERIOR NECK;  Surgeon: Eletha Boas, MD;  Location: [facility];  Service: General;  Laterality: Left;   FOOT SURGERY Left 1999   INGUINAL HERNIA REPAIR Left 05/15/2018   Procedure: LEFT INGUINAL HERNIA REPAIR WITH MESH;  Surgeon: Vanderbilt Ned, MD;  Location: [facility] OR;  Service: General;  Laterality: Left;   INSERTION OF MESH Left 05/15/2018   Procedure: INSERTION OF MESH;  Surgeon: Vanderbilt Ned, MD;  Location: [facility];  Service: General;  Laterality: Left;   INTRAOPERATIVE CHOLANGIOGRAM N/A 03/20/2022   Procedure: INTRAOPERATIVE CHOLANGIOGRAM;  Surgeon:  Eletha Boas, MD;  Location: [facility];  Service: General;  Laterality: N/A;   REVERSE SHOULDER ARTHROPLASTY Left 08/30/2022   Procedure: LEFT REVERSE SHOULDER ARTHROPLASTY;  Surgeon: Addie Cordella Hamilton, MD;  Location: [facility];  Service: Orthopedics;  Laterality: Left;   REVERSE SHOULDER ARTHROPLASTY Right 08/08/2023   Procedure: RIGHT REVERSE SHOULDER ARTHROPLASTY;  Surgeon: Addie Cordella Hamilton, MD;  Location: [facility] OR;  Service: Orthopedics;  Laterality: Right;   SHOULDER ARTHROSCOPY W/ ROTATOR CUFF REPAIR Left    yrs ago   Patient Active Problem List   Diagnosis Date Noted   Hypocalcemia 12/13/2023   Essential hypertension 10/07/2023   Arthritis of right shoulder region 08/20/2023   S/P reverse total shoulder arthroplasty, 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 GOLD III/ active smoker 08/14/2018   Hypokalemia 09/23/2015   COPD (chronic obstructive pulmonary disease) (HCC) 10/14/2014   Dyslipidemia associated with type 2 diabetes mellitus (HCC) 10/14/2014   PCP: Purcell Emil Schanz, MD   REFERRING PROVIDER: Addie Cordella Hamilton, MD   REFERRING DIAG:  [id number] (ICD-10-CM) - Cervical radiculopathy  M54.2 (ICD-10-CM) - Neck pain    THERAPY DIAG:  Cervicalgia  Muscle weakness  Rationale for Evaluation and Treatment: Rehabilitation  ONSET DATE: Over 6 months  SUBJECTIVE:   SUBJECTIVE STATEMENT: Pt reports his neck feels a lot better, about 50%.  EVAL: Pt reports he has been experiencing neck, R shoulder pain and headaches for over  6 months. The pain is worse when trying to sleep and he has mod pain with neck movements, and min to no pain when he holds his head still.  PERTINENT HISTORY: L and R rTSA, see PMH  PAIN:  Are you having pain? Yes: NPRS scale: 0/10 at rest, 4/10 with neck movement, and 10/10 with trying to sleep at night Pain location: neck, R shoulder pain and headaches Pain description: ache, pressure Aggravating factors: Sleeping, neck movements Relieving factors: Rest  PRECAUTIONS: None  RED FLAGS: None   WEIGHT BEARING RESTRICTIONS: No  FALLS:  Has patient fallen in last 6 months? No  LIVING ENVIRONMENT: Lives with: lives with their family Lives in: House/apartment Able to access home  OCCUPATION: Retired  PLOF: Independent  PATIENT GOALS: Pain relief  NEXT MD VISIT: Not scheduled  OBJECTIVE:  Note: Objective measures were completed at Evaluation unless otherwise noted.  DIAGNOSTIC FINDINGS:  IMPRESSION: 1. Chronic fusion across the C6-7 disc space. Mild narrowing of the ventral subarachnoid space but  no compressive effect upon the cord. Mild bilateral foraminal stenosis, not grossly compressive. 2. C3-4: Bilateral uncovertebral osteophytes. Bilateral foraminal stenosis could affect either C4 nerve. 3. C4-5: Endplate osteophytes and bulging of the disc. Bilateral uncovertebral osteophytes. Bilateral foraminal stenosis could affect either C5 nerve. 4. C5-6: Bilateral uncovertebral hypertrophy. Bilateral foraminal stenosis could affect either C6 nerve.  PATIENT SURVEYS:  NDI: 40/50=80%  COGNITION: Overall cognitive status: Within functional limits for tasks assessed  SENSATION: WFL  POSTURE: rounded shoulders, forward head, and decreased thoracic kyphosis  PALPATION: TTP to the upper traps and suboccipital muscles   CERVICAL ROM:   Active ROM A/PROM (deg) eval AROM  Flexion 50 pressure   Extension 45   Right lateral flexion 30 pain/pressure   Left lateral  flexion 40 pressure   Right rotation 30 pain/pressure 55 tight  Left rotation 40 pressure 60 tight   (Blank rows = not tested)  UPPER EXTREMITY ROM:  WFLs Active ROM Right eval Left eval  Shoulder flexion    Shoulder extension    Shoulder abduction    Shoulder adduction    Shoulder extension    Shoulder internal rotation    Shoulder external rotation    Elbow flexion    Elbow extension    Wrist flexion    Wrist extension    Wrist ulnar deviation    Wrist radial deviation    Wrist pronation    Wrist supination     (Blank rows = not tested)  UPPER EXTREMITY MMT: UE myotome negative MMT Right eval Left eval  Shoulder flexion    Shoulder extension    Shoulder abduction    Shoulder adduction    Shoulder extension    Shoulder internal rotation    Shoulder external rotation    Middle trapezius    Lower trapezius    Elbow flexion    Elbow extension    Wrist flexion    Wrist extension    Wrist ulnar deviation    Wrist radial deviation    Wrist pronation    Wrist supination    Grip strength 25/30=27.5 60/70=65   (Blank rows = not tested)  CERVICAL SPECIAL TESTS:  Spurling's test: Positive  TREATMENT DATE:  [facility] Adult PT Treatment:                                                DATE: 01/03/24 Manual Therapy: STM to the upper trap, levator, cervical paraspinals, and suboccipitals. TPR to the L upper trap. Therapeutic Exercise: Supine chin tucks x10 3 Supine DNF lifts x10 3 Supine shoulder horz abd x15 GTB Supine shoulder horz abd x15 GTB Supine cervical rotation x5 5 Seated chin tuck c cervical SB x4 5 Self Care: Taped tennis ball suboccipital massage in sitting. In supine, pt experienced too much pressure  [facility] Adult PT Treatment:                                                DATE: 12/29/23 Manual Therapy: STM to the upper trap, levator, cervical paraspinals, and suboccipitals UPAs C2-C6 Therapeutic Exercise: Supine chin tucks x10 3 Supine DNF lifts  x10 3 Supine cervical rotation x5 5 Seated chin tuck c cervical SB x4 5 Shoulder row 2x12  GTB  [facility] Adult PT Treatment:                                                DATE: 12/22/23 Therapeutic Exercise: Developed, instructed in, and pt completed therex as noted in HEP  Self Care: Sleeping positions and neck support for comfort                                                                                                                                 PATIENT EDUCATION:  Education details: Eval findings, POC, HEP, self care  Person educated: Patient Education method: Explanation, Demonstration, Tactile cues, Verbal cues, and Handouts Education comprehension: verbalized understanding, returned demonstration, verbal cues required, and tactile cues required  HOME EXERCISE PROGRAM: Access Code: [email or web address] URL: [email or web address] Date: 12/29/2023 Prepared by: Dasie Daft  Exercises - Supine Cervical Retraction with Towel  - 2 x daily - 7 x weekly - 1 sets - 10 reps - 3 hold - Supine DNF Liftoffs  - 2 x daily - 7 x weekly - 1 sets - 10 reps - 3 hold - Supine Cervical Rotation AROM on Pillow  - 2 x daily - 7 x weekly - 1 sets - 5 reps - 5 hold - Standing Cervical Retraction with Sidebending  - 2 x daily - 7 x weekly - 1 sets - 10 reps - 5 hold - Standing Shoulder Row with Anchored Resistance  - 12 x daily - 7 x weekly - 2 sets - 10 reps - 3 hold  ASSESSMENT:  CLINICAL IMPRESSION: Pt was instructed in using 2 taped tennis balls for suboccipital massage. Completing supine created too much pressure. Pt was able to tolerate in sitting and applying by hand. STM and TPR release were provided as above. The L upper trap muscle tightness decreased with the TPR. Exercises were then completed for cervical ROM, and postural/posterior chain strengthening. Re-assessed pts cervical rotation ROM and it was found much improved in comparison to eval. Additionally, pt reports an overall 50%  improvement on his neck pain. Pt will continue to benefit from skilled PT to address impairments for improved function with minimized pain.     EVAL: Patient is a 67 y.o. male who was seen today for physical therapy evaluation and treatment for  M54.12 (ICD-10-CM) - Cervical radiculopathy  M54.2 (ICD-10-CM) - Neck pain  Pt presents with decreased neck ROM and significantly increased pain when trying to sleep and with neck movements. Pain is minimal or resolves when holding his head still. Pt experiences radicular type pain to his R upper shoulder. UE myotome screen was negative although R grip strength is significantly less than the L. Pt reports pain with R hand grip strength testing. Cervical MRI reveals marked degenerative changes. A HEP was initiated to address posture, and postural/posterior  chain strengthening. Pt reported greater ease of neck mobility after completing the HEP. Pt will benefit from skilled PT to address impairments to optimize function with less pain.   OBJECTIVE IMPAIRMENTS: decreased activity tolerance, decreased mobility, decreased ROM, decreased strength, increased muscle spasms, postural dysfunction, and pain.   ACTIVITY LIMITATIONS: lifting, sleeping, reach over head, and caring for others  PARTICIPATION LIMITATIONS: cleaning, community activity, and yard work  PERSONAL FACTORS: Past/current experiences, Time since onset of injury/illness/exacerbation, and 1 comorbidity: bilat rTSA are also affecting patient's functional outcome.   REHAB POTENTIAL: Good  CLINICAL DECISION MAKING: Evolving/moderate complexity  EVALUATION COMPLEXITY: Moderate   GOALS:  SHORT TERM GOALS = LTGs  LONG TERM GOALS: Target date: 02/09/24  Pt will be Ind in a final HEP to maintain achieved LOF Baseline: started Goal status: Ongoing  2.  Pt will report 50% or greater improvement in his neck pain with neck movements and with sleeping Baseline: 4-10/10 01/03/24: 50% improvement Goal  status: MET  3.  Increase neck rotation by 10d for improved mobility with driving and daily activities Baseline:  01/03/24: see ROM flow sheet Goal status: MET  4.  Increase R grip strength by 10# as indication of improved pain and for improved function Baseline: 27.5# Goal status: INITIAL  5.  Pt's NDI score will improve by the MCID to 65% as indication of improved function  Baseline:80%  Goal status: INITIAL     PLAN:  PT FREQUENCY: 2x/week  PT DURATION: 6 weeks  PLANNED INTERVENTIONS: 97164- PT Re-evaluation, 97110-Therapeutic exercises, 97530- Therapeutic activity, 97112- Neuromuscular re-education, 97535- Self Care, [id number]- Manual therapy, [id number]- Electrical stimulation (unattended), 02987- Traction (mechanical), 20560 (1-2 muscles), 20561 (3+ muscles)- Dry Needling, Patient/Family education, Taping, Joint mobilization, Spinal mobilization, Cryotherapy, and Moist heat  PLAN FOR NEXT SESSION: Assess response to HEP; progress therex as indicated; use of modalities, manual therapy; and TPDN as indicated.  Divonte Senger MS, PT 01/03/24 1:29 PM

## 2024-01-03 ENCOUNTER — Ambulatory Visit: Attending: Orthopedic Surgery

## 2024-01-03 DIAGNOSIS — M6281 Muscle weakness (generalized): Secondary | ICD-10-CM | POA: Insufficient documentation

## 2024-01-03 DIAGNOSIS — M542 Cervicalgia: Secondary | ICD-10-CM | POA: Insufficient documentation

## 2024-01-03 DIAGNOSIS — M25511 Pain in right shoulder: Secondary | ICD-10-CM | POA: Insufficient documentation

## 2024-01-08 NOTE — Therapy (Signed)
 OUTPATIENT PHYSICAL THERAPY CERVICAL EVALUATION   Patient Name: Duane Cuevas MRN: [id number] DOB:[birth year], 69 y.o., male Today's Date: 01/09/2024  END OF SESSION:  PT End of Session - 01/09/24 1104     Visit Number 4    Number of Visits 13    Date for PT Re-Evaluation 02/09/24    Authorization Type [employer] MEDICARE HMO/PPO; MEDICAID OF [place]    PT Start Time 1104    PT Stop Time 1145    PT Time Calculation (min) 41 min    Activity Tolerance Patient tolerated treatment well    Behavior During Therapy WFL for tasks assessed/performed             Past Medical History:  Diagnosis Date   Cerebral aneurysm, nonruptured    followed by dr n. 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   Coronary artery disease    (pt admitted for chest pain ) nuclear stress test 11-02-2019  low risk, nuclear ef 58%,  prior inferior defect of severe severity indicative of previous MI;;   echo 04-28-2020 ef 60-65%, mild concentric LVH, RSVP 54.32mmHg;;   cardiac CTA 09-15-2020  unremarkable,  calcium  score=zero   Dependence on nocturnal oxygen  therapy    02-27-2023  per pt daughter,  pt only uses at night w/ O2 sat ,2-3L via [place],  only uses portable if going outside when every hot which is not much   Diverticulosis of colon    DOE (dyspnea on exertion)    02-27-2023  per pt daugter sob w/ stairs, incline's,  ok w/ some yard work if not warm/ hot wearther,  can do house hold chores   ED (erectile dysfunction)    Emphysema/COPD (HCC)    followed by pcp;   uses breztri  bid, uses preventil nebulizer every afternoon;   last exacerbation 06/ 2023 in epic   Full dentures    GERD (gastroesophageal reflux disease)    Hepatic steatosis    History of adenomatous polyp of colon    History of gastric ulcer    per EGD 09-16-2020  non-bleeding   History of MI (myocardial infarction)    per nuclear stress test in epic 11-02-2019  showed prior inferior defect of severe severity indicative of previous MI   History of pelvic fracture    1999  and 2009   Hyperlipidemia    Hypertension    Smokers' cough (HCC)    Type 2 diabetes mellitus treated with insulin  (HCC)    followed by pcp;  dx 2016   (02-27-2023  per pt daughter pt   Wears glasses    Past Surgical History:  Procedure Laterality Date   BICEPT TENODESIS Right 08/08/2023   Procedure: BICEPS TENODESIS;  Surgeon: Addie Cordella Hamilton, MD;  Location: [facility] OR;  Service: Orthopedics;  Laterality: Right;   BIOPSY  09/16/2020   Procedure: BIOPSY;  Surgeon: Eda Iha, MD;  Location: [facility];  Service: Gastroenterology;;   CHOLECYSTECTOMY N/A 03/20/2022   Procedure: LAPAROSCOPIC CHOLECYSTECTOMY;  Surgeon: Eletha Boas, MD;  Location: [facility];  Service: General;  Laterality: N/A;   COLONOSCOPY WITH PROPOFOL   05/10/2018   dr leigh   ESOPHAGOGASTRODUODENOSCOPY N/A 09/23/2015   Procedure: ESOPHAGOGASTRODUODENOSCOPY (EGD);  Surgeon: Gwendlyn ONEIDA Buddy, MD;  Location: [facility];  Service: Endoscopy;  Laterality: N/A;   ESOPHAGOGASTRODUODENOSCOPY (EGD) WITH PROPOFOL  N/A 09/16/2020   Procedure: ESOPHAGOGASTRODUODENOSCOPY (EGD) WITH PROPOFOL ;  Surgeon: Eda Iha, MD;  Location: [facility];  Service: Gastroenterology;  Laterality: N/A;   EXCISION MASS NECK Left 03/09/2023   Procedure: EXCISION SOFT TISSUE MASS LEFT POSTERIOR NECK;  Surgeon: Eletha Boas, MD;  Location: [facility];  Service: General;  Laterality: Left;   FOOT SURGERY Left 1999   INGUINAL HERNIA REPAIR Left 05/15/2018   Procedure: LEFT INGUINAL HERNIA REPAIR WITH MESH;  Surgeon: Vanderbilt Ned, MD;  Location: [facility] OR;  Service: General;  Laterality: Left;   INSERTION OF MESH Left 05/15/2018   Procedure: INSERTION OF MESH;  Surgeon: Vanderbilt Ned, MD;  Location: [facility];  Service: General;  Laterality: Left;   INTRAOPERATIVE CHOLANGIOGRAM N/A 03/20/2022   Procedure: INTRAOPERATIVE CHOLANGIOGRAM;   Surgeon: Eletha Boas, MD;  Location: [facility];  Service: General;  Laterality: N/A;   REVERSE SHOULDER ARTHROPLASTY Left 08/30/2022   Procedure: LEFT REVERSE SHOULDER ARTHROPLASTY;  Surgeon: Addie Cordella Hamilton, MD;  Location: [facility] OR;  Service: Orthopedics;  Laterality: Left;   REVERSE SHOULDER ARTHROPLASTY Right 08/08/2023   Procedure: RIGHT REVERSE SHOULDER ARTHROPLASTY;  Surgeon: Addie Cordella Hamilton, MD;  Location: [facility] OR;  Service: Orthopedics;  Laterality: Right;   SHOULDER ARTHROSCOPY W/ ROTATOR CUFF REPAIR Left    yrs ago   Patient Active Problem List   Diagnosis Date Noted   Hypocalcemia 12/13/2023   Essential hypertension 10/07/2023   Arthritis of right shoulder region 08/20/2023   S/P reverse total shoulder arthroplasty, 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 GOLD III/ active smoker 08/14/2018   Hypokalemia 09/23/2015   COPD (chronic obstructive pulmonary disease) (HCC) 10/14/2014   Dyslipidemia associated with type 2 diabetes mellitus (HCC) 10/14/2014   PCP: Purcell Emil Schanz, MD   REFERRING PROVIDER: Addie Cordella Hamilton, MD   REFERRING DIAG:  [phone] (ICD-10-CM) - Cervical radiculopathy  M54.2 (ICD-10-CM) - Neck pain    THERAPY DIAG:  Cervicalgia  Muscle weakness  Rationale for Evaluation and Treatment: Rehabilitation  ONSET DATE: Over 6 months  SUBJECTIVE:   SUBJECTIVE STATEMENT: Pt reports his neck with the PT his neck is doing better.  EVAL: Pt reports he has been experiencing neck, R shoulder pain and  headaches for over 6 months. The pain is worse when trying to sleep and he has mod pain with neck movements, and min to no pain when he holds his head still.  PERTINENT HISTORY: L and R rTSA, see PMH  PAIN: 01/09/2024 2/10 Are you having pain? Yes: NPRS scale: 0/10 at rest, 4/10 with neck movement, and 10/10 with trying to sleep at night Pain location: neck, R shoulder pain and headaches Pain description: ache, pressure Aggravating factors: Sleeping, neck movements Relieving factors: Rest  PRECAUTIONS: None  RED FLAGS: None   WEIGHT BEARING RESTRICTIONS: No  FALLS:  Has patient fallen in last 6 months? No  LIVING ENVIRONMENT: Lives with: lives with their family Lives in: House/apartment Able to access home  OCCUPATION: Retired  PLOF: Independent  PATIENT GOALS: Pain relief  NEXT MD VISIT: Not scheduled  OBJECTIVE:  Note: Objective measures were completed at Evaluation unless otherwise noted.  DIAGNOSTIC FINDINGS:  IMPRESSION: 1. Chronic fusion across the C6-7 disc space. Mild narrowing of the ventral  subarachnoid space but no compressive effect upon the cord. Mild bilateral foraminal stenosis, not grossly compressive. 2. C3-4: Bilateral uncovertebral osteophytes. Bilateral foraminal stenosis could affect either C4 nerve. 3. C4-5: Endplate osteophytes and bulging of the disc. Bilateral uncovertebral osteophytes. Bilateral foraminal stenosis could affect either C5 nerve. 4. C5-6: Bilateral uncovertebral hypertrophy. Bilateral foraminal stenosis could affect either C6 nerve.  PATIENT SURVEYS:  NDI: 40/50=80%  COGNITION: Overall cognitive status: Within functional limits for tasks assessed  SENSATION: WFL  POSTURE: rounded shoulders, forward head, and decreased thoracic kyphosis  PALPATION: TTP to the upper traps and suboccipital muscles   CERVICAL ROM:   Active ROM A/PROM (deg) eval AROM  Flexion 50 pressure   Extension 45   Right lateral flexion 30  pain/pressure   Left lateral flexion 40 pressure   Right rotation 30 pain/pressure 55 tight  Left rotation 40 pressure 60 tight   (Blank rows = not tested)  UPPER EXTREMITY ROM:  WFLs Active ROM Right eval Left eval  Shoulder flexion    Shoulder extension    Shoulder abduction    Shoulder adduction    Shoulder extension    Shoulder internal rotation    Shoulder external rotation    Elbow flexion    Elbow extension    Wrist flexion    Wrist extension    Wrist ulnar deviation    Wrist radial deviation    Wrist pronation    Wrist supination     (Blank rows = not tested)  UPPER EXTREMITY MMT: UE myotome negative MMT Right eval Left eval  Shoulder flexion    Shoulder extension    Shoulder abduction    Shoulder adduction    Shoulder extension    Shoulder internal rotation    Shoulder external rotation    Middle trapezius    Lower trapezius    Elbow flexion    Elbow extension    Wrist flexion    Wrist extension    Wrist ulnar deviation    Wrist radial deviation    Wrist pronation    Wrist supination    Grip strength 25/30=27.5 60/70=65   (Blank rows = not tested)  CERVICAL SPECIAL TESTS:  Spurling's test: Positive  TREATMENT DATE:  [facility] Adult PT Treatment:                                                DATE: 01/09/24 Manual Therapy: STM to the upper trap, levator, cervical paraspinals, and suboccipitals. TPR to the L upper trap. Therapeutic Exercise: Supine chin tucks x10 3 Supine DNF lifts x10 3 Supine shoulder horz abd x15 GTB Supine shoulder horz ER x15 GTB Supine cervical rotation x2 15 Supine cervical SB x2 15 Seated chin tuck c cervical SB x2 15  [facility] Adult PT Treatment:                                                DATE: 01/03/24 Manual Therapy: STM to the upper trap, levator, cervical paraspinals, and suboccipitals. TPR to the L upper trap. Therapeutic Exercise: Supine chin tucks x10 3 Supine DNF lifts x10 3 Supine shoulder horz abd x15  GTB Supine shoulder horz ER x15 GTB Supine cervical rotation x5 5 Seated chin tuck  c cervical SB x4 5 Self Care: Taped tennis ball suboccipital massage in sitting. In supine, pt experienced too much pressure                                                                                                                               PATIENT EDUCATION:  Education details: Eval findings, POC, HEP, self care  Person educated: Patient Education method: Explanation, Demonstration, Tactile cues, Verbal cues, and Handouts Education comprehension: verbalized understanding, returned demonstration, verbal cues required, and tactile cues required  HOME EXERCISE PROGRAM: Access Code: [email or web address] URL: [email or web address] Date: 12/29/2023 Prepared by: Dasie Daft  Exercises - Supine Cervical Retraction with Towel  - 2 x daily - 7 x weekly - 1 sets - 10 reps - 3 hold - Supine DNF Liftoffs  - 2 x daily - 7 x weekly - 1 sets - 10 reps - 3 hold - Supine Cervical Rotation AROM on Pillow  - 2 x daily - 7 x weekly - 1 sets - 5 reps - 5 hold - Standing Cervical Retraction with Sidebending  - 2 x daily - 7 x weekly - 1 sets - 10 reps - 5 hold - Standing Shoulder Row with Anchored Resistance  - 12 x daily - 7 x weekly - 2 sets - 10 reps - 3 hold  ASSESSMENT:  CLINICAL IMPRESSION: Pt's subjective report indicates overall very good improvement with pain. Additionally, grip strength of the R hand has made significant gains. Pt notes using the taped tennis balla at home to massage the suboccipital area has been helpful. Manual therapy was provided as above with cervical traction completed today. Exercises were then completed for cervical ROM, and postural/posterior chain strengthening. Overall, pt is make good gains in all areas for pain, ROm, and strength.  EVAL: Patient is a 68 y.o. male who was seen today for physical therapy evaluation and treatment for  M54.12 (ICD-10-CM) - Cervical  radiculopathy  M54.2 (ICD-10-CM) - Neck pain  Pt presents with decreased neck ROM and significantly increased pain when trying to sleep and with neck movements. Pain is minimal or resolves when holding his head still. Pt experiences radicular type pain to his R upper shoulder. UE myotome screen was negative although R grip strength is significantly less than the L. Pt reports pain with R hand grip strength testing. Cervical MRI reveals marked degenerative changes. A HEP was initiated to address posture, and postural/posterior chain strengthening. Pt reported greater ease of neck mobility after completing the HEP. Pt will benefit from skilled PT to address impairments to optimize function with less pain.   OBJECTIVE IMPAIRMENTS: decreased activity tolerance, decreased mobility, decreased ROM, decreased strength, increased muscle spasms, postural dysfunction, and pain.   ACTIVITY LIMITATIONS: lifting, sleeping, reach over head, and caring for others  PARTICIPATION LIMITATIONS: cleaning, community activity, and yard work  PERSONAL FACTORS: Past/current experiences, Time since onset of injury/illness/exacerbation, and 1 comorbidity: bilat rTSA are also affecting patient's  functional outcome.   REHAB POTENTIAL: Good  CLINICAL DECISION MAKING: Evolving/moderate complexity  EVALUATION COMPLEXITY: Moderate   GOALS:  SHORT TERM GOALS = LTGs  LONG TERM GOALS: Target date: 02/09/24  Pt will be Ind in a final HEP to maintain achieved LOF Baseline: started Goal status: Ongoing  2.  Pt will report 50% or greater improvement in his neck pain with neck movements and with sleeping Baseline: 4-10/10 01/03/24: 50% improvement Goal status: MET  3.  Increase neck rotation by 10d for improved mobility with driving and daily activities Baseline:  01/03/24: see ROM flow sheet Goal status: MET  4.  Increase R grip strength by 10# as indication of improved pain and for improved function Baseline:  27.5# 01/09/24: 80, 80=80# Goal status: MET  5.  Pt's NDI score will improve by the MCID to 65% as indication of improved function  Baseline:80%  Goal status: INITIAL     PLAN:  PT FREQUENCY: 2x/week  PT DURATION: 6 weeks  PLANNED INTERVENTIONS: 97164- PT Re-evaluation, 97110-Therapeutic exercises, 97530- Therapeutic activity, 97112- Neuromuscular re-education, 97535- Self Care, [id number]- Manual therapy, [id number]- Electrical stimulation (unattended), 02987- Traction (mechanical), 20560 (1-2 muscles), 20561 (3+ muscles)- Dry Needling, Patient/Family education, Taping, Joint mobilization, Spinal mobilization, Cryotherapy, and Moist heat  PLAN FOR NEXT SESSION: Assess response to HEP; progress therex as indicated; use of modalities, manual therapy; and TPDN as indicated.  Jezebelle Ledwell MS, PT 01/09/24 11:46 AM

## 2024-01-09 ENCOUNTER — Ambulatory Visit

## 2024-01-09 DIAGNOSIS — M542 Cervicalgia: Secondary | ICD-10-CM | POA: Diagnosis not present

## 2024-01-09 DIAGNOSIS — M6281 Muscle weakness (generalized): Secondary | ICD-10-CM

## 2024-01-09 DIAGNOSIS — M25511 Pain in right shoulder: Secondary | ICD-10-CM | POA: Diagnosis not present

## 2024-01-11 ENCOUNTER — Encounter: Payer: Self-pay | Admitting: Physical Therapy

## 2024-01-11 ENCOUNTER — Other Ambulatory Visit: Payer: Self-pay | Admitting: Radiology

## 2024-01-11 ENCOUNTER — Ambulatory Visit: Admitting: Physical Therapy

## 2024-01-11 ENCOUNTER — Telehealth: Payer: Self-pay

## 2024-01-11 ENCOUNTER — Ambulatory Visit (INDEPENDENT_AMBULATORY_CARE_PROVIDER_SITE_OTHER)

## 2024-01-11 VITALS — BP 119/71 | HR 95 | Ht 68.75 in | Wt 166.4 lb

## 2024-01-11 DIAGNOSIS — I10 Essential (primary) hypertension: Secondary | ICD-10-CM

## 2024-01-11 DIAGNOSIS — M542 Cervicalgia: Secondary | ICD-10-CM | POA: Diagnosis not present

## 2024-01-11 DIAGNOSIS — E1159 Type 2 diabetes mellitus with other circulatory complications: Secondary | ICD-10-CM | POA: Diagnosis not present

## 2024-01-11 DIAGNOSIS — M6281 Muscle weakness (generalized): Secondary | ICD-10-CM

## 2024-01-11 DIAGNOSIS — Z5941 Food insecurity: Secondary | ICD-10-CM

## 2024-01-11 DIAGNOSIS — Z0000 Encounter for general adult medical examination without abnormal findings: Secondary | ICD-10-CM | POA: Diagnosis not present

## 2024-01-11 DIAGNOSIS — Z59811 Housing instability, housed, with risk of homelessness: Secondary | ICD-10-CM | POA: Diagnosis not present

## 2024-01-11 DIAGNOSIS — M25511 Pain in right shoulder: Secondary | ICD-10-CM | POA: Diagnosis not present

## 2024-01-11 DIAGNOSIS — Z1211 Encounter for screening for malignant neoplasm of colon: Secondary | ICD-10-CM

## 2024-01-11 DIAGNOSIS — I152 Hypertension secondary to endocrine disorders: Secondary | ICD-10-CM

## 2024-01-11 DIAGNOSIS — Z1212 Encounter for screening for malignant neoplasm of rectum: Secondary | ICD-10-CM | POA: Diagnosis not present

## 2024-01-11 DIAGNOSIS — H9193 Unspecified hearing loss, bilateral: Secondary | ICD-10-CM

## 2024-01-11 MED ORDER — PANTOPRAZOLE SODIUM 40 MG PO TBEC: 40.0000 mg | DELAYED_RELEASE_TABLET | Freq: Every day | ORAL | 3 refills | Status: AC

## 2024-01-11 MED ORDER — AMLODIPINE BESYLATE 5 MG PO TABS: 5.0000 mg | ORAL_TABLET | Freq: Every day | ORAL | 3 refills | Status: AC

## 2024-01-11 NOTE — Therapy (Signed)
 OUTPATIENT PHYSICAL THERAPY CERVICAL EVALUATION   Patient Name: Duane Cuevas MRN: [id number] DOB:[birth year], 68 y.o., male Today's Date: 01/11/2024  END OF SESSION:  PT End of Session - 01/11/24 1021     Visit Number 5    Number of Visits 13    Date for PT Re-Evaluation 02/09/24    Authorization Type [employer] MEDICARE HMO/PPO; MEDICAID OF [place]    PT Start Time 1018    PT Stop Time 1100    PT Time Calculation (min) 42 min    Activity Tolerance Patient tolerated treatment well    Behavior During Therapy WFL for tasks assessed/performed              Past Medical History:  Diagnosis Date   Cerebral aneurysm, nonruptured    followed by dr n. 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   Coronary artery disease    (pt admitted for chest pain ) nuclear stress test 11-02-2019  low risk, nuclear ef 58%,  prior inferior defect of severe severity indicative of previous MI;;   echo 04-28-2020 ef 60-65%, mild concentric LVH, RSVP 54.74mmHg;;   cardiac CTA 09-15-2020  unremarkable,  calcium  score=zero   Dependence on nocturnal oxygen  therapy    02-27-2023  per pt daughter,  pt only uses at night w/ O2 sat ,2-3L via [place],  only uses portable if going outside when every hot which is not much   Diverticulosis of colon    DOE (dyspnea on exertion)    02-27-2023  per pt daugter sob w/ stairs, incline's,  ok w/ some yard work if not warm/ hot wearther,  can do house hold chores   ED (erectile dysfunction)    Emphysema/COPD (HCC)    followed by pcp;   uses breztri  bid, uses preventil nebulizer every afternoon;   last exacerbation 06/ 2023 in epic   Full dentures    GERD (gastroesophageal reflux disease)    Hepatic steatosis    History of adenomatous polyp of colon    History of gastric ulcer    per EGD 09-16-2020  non-bleeding   History of MI (myocardial infarction)    per nuclear stress test in epic 11-02-2019  showed prior inferior defect of severe severity indicative of previous MI   History of pelvic fracture    1999  and 2009   Hyperlipidemia    Hypertension    Smokers' cough (HCC)    Type 2 diabetes mellitus treated with insulin  (HCC)    followed by pcp;  dx 2016   (02-27-2023  per pt daughter pt   Wears glasses    Past Surgical History:  Procedure Laterality Date   BICEPT TENODESIS Right 08/08/2023   Procedure: BICEPS TENODESIS;  Surgeon: Addie Cordella Hamilton, MD;  Location: [facility] OR;  Service: Orthopedics;  Laterality: Right;   BIOPSY  09/16/2020   Procedure: BIOPSY;  Surgeon: Eda Iha, MD;  Location: [facility];  Service: Gastroenterology;;   CHOLECYSTECTOMY N/A 03/20/2022   Procedure: LAPAROSCOPIC CHOLECYSTECTOMY;  Surgeon: Eletha Boas, MD;  Location: [facility];  Service: General;  Laterality: N/A;   COLONOSCOPY WITH PROPOFOL   05/10/2018   dr leigh   ESOPHAGOGASTRODUODENOSCOPY N/A 09/23/2015   Procedure: ESOPHAGOGASTRODUODENOSCOPY (EGD);  Surgeon: Gwendlyn ONEIDA Buddy, MD;  Location: [facility];  Service: Endoscopy;  Laterality: N/A;   ESOPHAGOGASTRODUODENOSCOPY (EGD) WITH PROPOFOL  N/A 09/16/2020   Procedure: ESOPHAGOGASTRODUODENOSCOPY (EGD) WITH PROPOFOL ;  Surgeon: Eda Iha, MD;  Location: [facility];  Service:  Gastroenterology;  Laterality: N/A;   EXCISION MASS NECK Left 03/09/2023   Procedure: EXCISION SOFT TISSUE MASS LEFT POSTERIOR NECK;  Surgeon: Eletha Boas, MD;  Location: [facility];  Service: General;  Laterality: Left;   FOOT SURGERY Left 1999   INGUINAL HERNIA REPAIR Left 05/15/2018   Procedure: LEFT INGUINAL HERNIA REPAIR WITH MESH;  Surgeon: Vanderbilt Ned, MD;  Location: [facility] OR;  Service: General;  Laterality: Left;   INSERTION OF MESH Left 05/15/2018   Procedure: INSERTION OF MESH;  Surgeon: Vanderbilt Ned, MD;  Location: [facility] OR;  Service: General;  Laterality: Left;   INTRAOPERATIVE CHOLANGIOGRAM N/A 03/20/2022   Procedure: INTRAOPERATIVE CHOLANGIOGRAM;   Surgeon: Eletha Boas, MD;  Location: [facility];  Service: General;  Laterality: N/A;   REVERSE SHOULDER ARTHROPLASTY Left 08/30/2022   Procedure: LEFT REVERSE SHOULDER ARTHROPLASTY;  Surgeon: Addie Cordella Hamilton, MD;  Location: [facility] OR;  Service: Orthopedics;  Laterality: Left;   REVERSE SHOULDER ARTHROPLASTY Right 08/08/2023   Procedure: RIGHT REVERSE SHOULDER ARTHROPLASTY;  Surgeon: Addie Cordella Hamilton, MD;  Location: [facility] OR;  Service: Orthopedics;  Laterality: Right;   SHOULDER ARTHROSCOPY W/ ROTATOR CUFF REPAIR Left    yrs ago   Patient Active Problem List   Diagnosis Date Noted   Hypocalcemia 12/13/2023   Essential hypertension 10/07/2023   Arthritis of right shoulder region 08/20/2023   S/P reverse total shoulder arthroplasty, 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 GOLD III/ active smoker 08/14/2018   Hypokalemia 09/23/2015   COPD (chronic obstructive pulmonary disease) (HCC) 10/14/2014   Dyslipidemia associated with type 2 diabetes mellitus (HCC) 10/14/2014   PCP: Purcell Emil Schanz, MD   REFERRING PROVIDER: Addie Cordella Hamilton, MD   REFERRING DIAG:  [phone] (ICD-10-CM) - Cervical radiculopathy  M54.2 (ICD-10-CM) - Neck pain    THERAPY DIAG:  Cervicalgia  Muscle weakness  Rationale for Evaluation and Treatment: Rehabilitation  ONSET DATE: Over 6 months  SUBJECTIVE:   SUBJECTIVE STATEMENT: Pt reports his neck with the PT his neck is doing better.  EVAL: Pt reports he has been experiencing neck, R shoulder pain and  headaches for over 6 months. The pain is worse when trying to sleep and he has mod pain with neck movements, and min to no pain when he holds his head still.  PERTINENT HISTORY: L and R rTSA, see PMH  PAIN: 01/11/2024 2/10 Are you having pain? Yes: NPRS scale: 0/10 at rest, up to 2/10-3/10 Pain location: neck, R shoulder pain and headaches Pain description: ache, pressure Aggravating factors: Sleeping, neck movements Relieving factors: Rest  PRECAUTIONS: None  RED FLAGS: None   WEIGHT BEARING RESTRICTIONS: No  FALLS:  Has patient fallen in last 6 months? No  LIVING ENVIRONMENT: Lives with: lives with their family Lives in: House/apartment Able to access home  OCCUPATION: Retired  PLOF: Independent  PATIENT GOALS: Pain relief  NEXT MD VISIT: Not scheduled  OBJECTIVE:  Note: Objective measures were completed at Evaluation unless otherwise noted.  DIAGNOSTIC FINDINGS:  IMPRESSION: 1. Chronic fusion across the C6-7 disc space. Mild narrowing of the ventral subarachnoid space but no compressive effect upon  the cord. Mild bilateral foraminal stenosis, not grossly compressive. 2. C3-4: Bilateral uncovertebral osteophytes. Bilateral foraminal stenosis could affect either C4 nerve. 3. C4-5: Endplate osteophytes and bulging of the disc. Bilateral uncovertebral osteophytes. Bilateral foraminal stenosis could affect either C5 nerve. 4. C5-6: Bilateral uncovertebral hypertrophy. Bilateral foraminal stenosis could affect either C6 nerve.  PATIENT SURVEYS:  NDI: 40/50=80%  COGNITION: Overall cognitive status: Within functional limits for tasks assessed  SENSATION: WFL  POSTURE: rounded shoulders, forward head, and decreased thoracic kyphosis  PALPATION: TTP to the upper traps and suboccipital muscles   CERVICAL ROM:   Active ROM A/PROM (deg) eval AROM 01/11/24  Flexion 50 pressure   Extension 45   Right lateral flexion 30 pain/pressure 40 deg with stretch    Left lateral flexion 40 pressure 40 with stretch  Right rotation 30 pain/pressure 55 tight  Left rotation 40 pressure 60 tight   (Blank rows = not tested)  UPPER EXTREMITY ROM:  WFLs Active ROM Right eval Left eval  Shoulder flexion    Shoulder extension    Shoulder abduction    Shoulder adduction    Shoulder extension    Shoulder internal rotation    Shoulder external rotation    Elbow flexion    Elbow extension    Wrist flexion    Wrist extension    Wrist ulnar deviation    Wrist radial deviation    Wrist pronation    Wrist supination     (Blank rows = not tested)  UPPER EXTREMITY MMT: UE myotome negative MMT Right eval Left eval  Shoulder flexion    Shoulder extension    Shoulder abduction    Shoulder adduction    Shoulder extension    Shoulder internal rotation    Shoulder external rotation    Middle trapezius    Lower trapezius    Elbow flexion    Elbow extension    Wrist flexion    Wrist extension    Wrist ulnar deviation    Wrist radial deviation    Wrist pronation    Wrist supination    Grip strength 25/30=27.5 60/70=65   (Blank rows = not tested)  CERVICAL SPECIAL TESTS:  Spurling's test: Positive  TREATMENT DATE:   [facility] Adult PT Treatment:                                                DATE: 01/11/24 Therapeutic Exercise: UBE 5 min level 1  Supine chin tuck Supine head lift x 5  Supine chest lift x 10 , 5 lbs  Single arm chest press x 10 , 5 lbs  Seated green band horizontal pull Narrow OH lift with green band used wall for feedback  Scapular protraction/retraction at wall  ER green band x 15 at wall  Shoulder flexion 3 lbs x 10 Scaption 3 lbs x 10  Cervical lateral flexion stretching x 3 x 20 sec each side    [facility] Adult PT Treatment:                                                DATE: 01/09/24 Manual Therapy: STM to the upper trap, levator, cervical paraspinals, and suboccipitals. TPR to the L upper trap. Therapeutic  Exercise: Supine chin tucks x10 3 Supine DNF lifts x10 3 Supine shoulder horz abd x15 GTB Supine shoulder horz ER x15 GTB Supine cervical rotation x2 15 Supine cervical SB x2 15 Seated chin tuck c cervical SB x2 15  [facility] Adult PT Treatment:                                                DATE: 01/03/24 Manual Therapy: STM to the upper trap, levator, cervical paraspinals, and suboccipitals. TPR to the L upper trap. Therapeutic Exercise: Supine chin tucks x10 3 Supine DNF lifts x10 3 Supine shoulder horz abd x15 GTB Supine shoulder horz ER x15 GTB Supine cervical rotation x5 5 Seated chin tuck c cervical SB x4 5 Self Care: Taped tennis ball suboccipital massage in sitting. In supine, pt experienced too much pressure                                                                                                                               PATIENT EDUCATION:  Education details: Eval findings, POC, HEP, self care  Person educated: Patient Education method: Explanation, Demonstration, Tactile cues, Verbal cues, and Handouts Education comprehension: verbalized understanding, returned demonstration, verbal cues required, and tactile cues required  HOME EXERCISE PROGRAM: Access Code: [email or web address] URL: [email or web address] Date: 12/29/2023 Prepared by: Dasie Daft  Exercises - Supine Cervical Retraction with Towel  - 2 x daily - 7 x weekly - 1 sets - 10 reps - 3 hold - Supine DNF Liftoffs  - 2 x daily - 7 x weekly - 1 sets - 10 reps - 3 hold - Supine Cervical Rotation AROM on Pillow  - 2 x daily - 7 x weekly - 1 sets - 5 reps - 5 hold - Standing Cervical Retraction with Sidebending  - 2 x daily - 7 x weekly - 1 sets - 10 reps - 5 hold - Standing Shoulder Row with Anchored Resistance  - 12 x daily - 7 x weekly - 2 sets - 10 reps - 3 hold  ASSESSMENT:  CLINICAL IMPRESSION:  Patient continues to report improvements in overall pain and function.  Neck pain does not  interrupt his sleep as it did when he first began.  In standing he needed moderate cueing in order to improve his postural alignment as well as avoiding extreme retracted position. Patient will continue to benefit from skilled PT in order to return to PLOF and optimize functional mobility.     EVAL: Patient is a 68 y.o. male who was seen today for physical therapy evaluation and treatment for  M54.12 (ICD-10-CM) - Cervical radiculopathy  M54.2 (ICD-10-CM) - Neck pain  Pt presents with decreased neck ROM and significantly increased pain when trying to sleep and with neck movements. Pain is minimal or resolves when holding his head  still. Pt experiences radicular type pain to his R upper shoulder. UE myotome screen was negative although R grip strength is significantly less than the L. Pt reports pain with R hand grip strength testing. Cervical MRI reveals marked degenerative changes. A HEP was initiated to address posture, and postural/posterior chain strengthening. Pt reported greater ease of neck mobility after completing the HEP. Pt will benefit from skilled PT to address impairments to optimize function with less pain.   OBJECTIVE IMPAIRMENTS: decreased activity tolerance, decreased mobility, decreased ROM, decreased strength, increased muscle spasms, postural dysfunction, and pain.   ACTIVITY LIMITATIONS: lifting, sleeping, reach over head, and caring for others  PARTICIPATION LIMITATIONS: cleaning, community activity, and yard work  PERSONAL FACTORS: Past/current experiences, Time since onset of injury/illness/exacerbation, and 1 comorbidity: bilat rTSA are also affecting patient's functional outcome.   REHAB POTENTIAL: Good  CLINICAL DECISION MAKING: Evolving/moderate complexity  EVALUATION COMPLEXITY: Moderate   GOALS:  SHORT TERM GOALS = LTGs  LONG TERM GOALS: Target date: 02/09/24  Pt will be Ind in a final HEP to maintain achieved LOF Baseline: started Goal status: Ongoing  2.   Pt will report 50% or greater improvement in his neck pain with neck movements and with sleeping Baseline: 4-10/10 01/03/24: 50% improvement Goal status: MET  3.  Increase neck rotation by 10d for improved mobility with driving and daily activities Baseline:  01/03/24: see ROM flow sheet Goal status: MET  4.  Increase R grip strength by 10# as indication of improved pain and for improved function Baseline: 27.5# 01/09/24: 80, 80=80# Goal status: MET  5.  Pt's NDI score will improve by the MCID to 65% as indication of improved function  Baseline:80%  Goal status: INITIAL     PLAN:  PT FREQUENCY: 2x/week  PT DURATION: 6 weeks  PLANNED INTERVENTIONS: 97164- PT Re-evaluation, 97110-Therapeutic exercises, 97530- Therapeutic activity, 97112- Neuromuscular re-education, 97535- Self Care, 02859- Manual therapy, G0283- Electrical stimulation (unattended), 02987- Traction (mechanical), 20560 (1-2 muscles), 20561 (3+ muscles)- Dry Needling, Patient/Family education, Taping, Joint mobilization, Spinal mobilization, Cryotherapy, and Moist heat  PLAN FOR NEXT SESSION: Assess response to HEP; progress therex as indicated; use of modalities, manual therapy; and TPDN as indicated.  Delon Norma, PT 01/11/24 11:53 AM Phone: [phone] Fax: [phone]

## 2024-01-11 NOTE — Telephone Encounter (Signed)
 Patient is requesting a refill for Amlodipine   5 mg, daily and Pantoprazole  40 mg daily.

## 2024-01-11 NOTE — Patient Instructions (Signed)
 [name] , Thank you for taking time out of your busy schedule to complete your Annual Wellness Visit with me. I enjoyed our conversation and look forward to speaking with you again next year. I, as well as your care team,  appreciate your ongoing commitment to your health goals. Please review the following plan we discussed and let me know if I can assist you in the future. Your Game plan/ To Do List    Referrals: If you haven't heard from the office you've been referred to, please reach out to them at the phone provided.  [facility], Address: [address] [place] [address], [place], [place] [address] Phone: [phone] Follow up Visits: Next Medicare AWV with our clinical staff: 01/13/2025.   Have you seen your provider in the last 6 months (3 months if uncontrolled diabetes)? Yes Next Office Visit with your provider: 02/14/2024.  Clinician Recommendations:  Aim for 30 minutes of exercise or brisk walking, 6-8 glasses of water , and 5 servings of fruits and vegetables each day. You are due for a colonoscopy, please call above number to get scheduled.  You are due for a tetanus vaccine and a Shingles vaccine, which can be given at your local pharmacy.  You are also due for a foot exam, a Hep C screening and a kidney evaluation, which all will be done at your next office visit with Dr. Purcell.        This is a list of the screening recommended for you and due dates:  Health Maintenance  Topic Date Due   Complete foot exam   Never done   Yearly kidney health urinalysis for diabetes  Never done   Hepatitis C Screening  Never done   Zoster (Shingles) Vaccine (1 of 2) Never done   COVID-19 Vaccine (4 - 2024-25 season) 03/05/2023   Colon Cancer Screening  05/11/2023   Flu Shot  02/02/2024   Hemoglobin A1C  06/14/2024   Eye exam for diabetics  08/02/2024   Yearly kidney function blood test for diabetes  12/15/2024   Medicare Annual Wellness Visit  01/10/2025    Pneumococcal Vaccine for age over 71  Completed   Hepatitis B Vaccine  Aged Out   HPV Vaccine  Aged Out   Meningitis B Vaccine  Aged Out   DTaP/Tdap/Td vaccine  Discontinued    Advanced directives: (Declined) Advance directive discussed with you today. Even though you declined this today, please call our office should you change your mind, and we can give you the proper paperwork for you to fill out. Advance Care Planning is important because it:  [x]  Makes sure you receive the medical care that is consistent with your values, goals, and preferences  [x]  It provides guidance to your family and loved ones and reduces their decisional burden about whether or not they are making the right decisions based on your wishes.  Follow the link provided in your after visit summary or read over the paperwork we have mailed to you to help you started getting your Advance Directives in place. If you need assistance in completing these, please reach out to us  so that we can help you!  See attachments for Preventive Care and Fall Prevention Tips.

## 2024-01-11 NOTE — Telephone Encounter (Signed)
 Okay to refill please

## 2024-01-11 NOTE — Progress Notes (Signed)
 Subjective:   Alcide Memoli is a 68 y.o. who presents for a Medicare Wellness preventive visit.  As a reminder, Annual Wellness Visits don't include a physical exam, and some assessments may be limited, especially if this visit is performed virtually. We may recommend an in-person follow-up visit with your provider if needed.  Visit Complete: In person  Persons Participating in Visit: Interpreter and patient was present during visit.  AWV Questionnaire: No: Patient Medicare AWV questionnaire was not completed prior to this visit.  Cardiac Risk Factors include: advanced age (>8men, >53 women);male gender;hypertension;Other (see comment);dyslipidemia;diabetes mellitus, Risk factor comments: CAD, COPD,     Objective:    Today's Vitals   01/11/24 0854  BP: 119/71  Pulse: 95  SpO2: 98%  Weight: 166 lb 6.4 oz (75.5 kg)  Height: 5' 8.75 (1.746 m)   Body mass index is 24.75 kg/m.     01/11/2024    9:00 AM 12/22/2023   12:55 PM 12/13/2023    4:20 AM 10/07/2023    3:16 PM 10/07/2023    9:45 AM 09/30/2023   10:55 AM 08/30/2023    5:45 AM  Advanced Directives  Does Patient Have a Medical Advance Directive? No No No  No No No  Would patient like information on creating a medical advance directive? No - Patient declined No - Patient declined No - Patient declined No - Patient declined   No - Patient declined    Current Medications (verified) Outpatient Encounter Medications as of 01/11/2024  Medication Sig   albuterol  (PROVENTIL ) (2.5 MG/3ML) 0.083% nebulizer solution Take 3 mLs (2.5 mg total) by nebulization every 6 (six) hours as needed for wheezing or shortness of breath.   albuterol  (VENTOLIN  HFA) 108 (90 Base) MCG/ACT inhaler 2 inhalaciones Inhalaci n Cada 6 horas PRN, sibilancias, dificultad para respirar   amLODipine  (NORVASC ) 5 MG tablet TOMAR 1 TABLETA POR VAI ORAL UNA VEZ AL DIA (Patient taking differently: Take 5 mg by mouth in the morning.)   aspirin  EC 81 MG tablet Take  1 tablet (81 mg total) by mouth daily. Swallow whole.   atorvastatin  (LIPITOR) 20 MG tablet Take 1 tablet (20 mg total) by mouth daily. TOMAR 1 TABLETA POR VIA ORAL UNA VEZ AL DIA   Budeson-Glycopyrrol-Formoterol  (BREZTRI  AEROSPHERE) 160-9-4.8 MCG/ACT AERO INHALAR 2 BOCANADAS POR VIA ORAL DOS VECES AL DIA   cyclobenzaprine  (FLEXERIL ) 10 MG tablet Take 1 tablet (10 mg total) by mouth 2 (two) times daily as needed for muscle spasms.   Dulaglutide  (TRULICITY ) 1.5 MG/0.5ML SOAJ INYECTAR CONTENIDOS DE UN LAPIZ POR VIA SUBCUTANEA CADA SEMANA, EL MISMO DIA CADA SEMANA (Patient taking differently: Inject 1.5 mg into the skin once a week. On Thursday)   FARXIGA  10 MG TABS tablet TOMAR 1 TABLETA POR VIA ORAL UNA VEZ AL DIA   gabapentin  (NEURONTIN ) 300 MG capsule Take 1 capsule (300 mg total) by mouth at bedtime as needed. (Patient taking differently: Take 300 mg by mouth at bedtime.)   glipiZIDE  (GLUCOTROL ) 10 MG tablet TOMAR 1 TABLETA POR VIA ORAL UNA VEZ AL DIA ANTES DEL DESAYUNO *TOMAR 1 TABLETA ADICIONAL SI AZUCAR EN LA SANGRE ESTA ALTA*   guaifenesin  (ROBITUSSIN) 100 MG/5ML syrup Take 200 mg by mouth 3 (three) times daily as needed for cough.   ibuprofen  (ADVIL ) 800 MG tablet Take 1 tablet (800 mg total) by mouth every 8 (eight) hours as needed.   insulin  aspart (NOVOLOG  FLEXPEN) 100 UNIT/ML FlexPen INYECTAR 3 UNIDADES POR VIA SUBCUTANEA TRES VECES AL DIA  COMO INDICADO. AJUSTAR CANTIDAD DE INSULINA POR ESCALA MOVIL. MAS DOSIS 30 UNIDADES (Patient taking differently: Inject 0-3 Units into the skin in the morning, at noon, and at bedtime. INYECTAR 3 UNIDADES POR VIA SUBCUTANEA TRES VECES AL DIA COMO INDICADO. AJUSTAR CANTIDAD DE INSULINA POR ESCALA MOVIL. MAS DOSIS 30 UNIDADES.)   mupirocin  ointment (BACTROBAN ) 2 % Apply 1 Application topically daily. 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  Follow discharge instructions on paperwork for duration (Patient taking differently: Apply 1 Application  topically daily as needed. 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  Follow discharge instructions on paperwork for duration)   pantoprazole  (PROTONIX ) 40 MG tablet TOMAR 1 TABLETA POR VIA ORAL UNA VEZ AL DIA (Patient taking differently: Take 40 mg by mouth in the morning.)   predniSONE  (DELTASONE ) 10 MG tablet Take 4 tabs daily for 3 days, then 3 tabs daily for 3 days, then 2 tabs daily for 3 days, then 1 tab daily for 3 days, then 1/2 tab daily for 4 days.   sildenafil  (VIAGRA ) 100 MG tablet Take 0.5-1 tablets (50-100 mg total) by mouth daily as needed for erectile dysfunction.   TRESIBA  FLEXTOUCH 100 UNIT/ML FlexTouch Pen INYECTAR 25 UNIDADES POR VIA SUBCUTANEA DIARIAMENTE (Patient taking differently: Inject 30 Units into the skin in the morning.)   Facility-Administered Encounter Medications as of 01/11/2024  Medication   morphine  2 MG/ML injection    Allergies (verified) Patient has no known allergies.   History: Past Medical History:  Diagnosis Date   Cerebral aneurysm, nonruptured    followed by dr n. 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   Coronary artery disease    (pt admitted for chest pain ) nuclear stress test 11-02-2019  low risk, nuclear ef 58%,  prior inferior defect of severe severity indicative of previous MI;;   echo 04-28-2020 ef 60-65%, mild concentric LVH, RSVP 54.89mmHg;;   cardiac CTA 09-15-2020  unremarkable,  calcium  score=zero   Dependence on nocturnal oxygen  therapy    02-27-2023  per pt daughter,  pt only uses at night w/ O2 sat ,2-3L via [place],  only uses portable if going outside when every hot which is not much   Diverticulosis of colon    DOE (dyspnea on exertion)    02-27-2023  per pt daugter sob w/ stairs, incline's,  ok w/ some yard work if not warm/ hot wearther,  can do house hold chores   ED (erectile dysfunction)    Emphysema/COPD (HCC)    followed by pcp;    uses breztri  bid, uses preventil nebulizer every afternoon;   last exacerbation 06/ 2023 in epic   Full dentures    GERD (gastroesophageal reflux disease)    Hepatic steatosis    History of adenomatous polyp of colon    History of gastric ulcer    per EGD 09-16-2020  non-bleeding   History of MI (myocardial infarction)    per nuclear stress test in epic 11-02-2019 showed prior inferior defect of severe severity indicative of previous MI   History of pelvic fracture    1999  and 2009   Hyperlipidemia    Hypertension    Smokers' cough (HCC)    Type 2 diabetes mellitus treated with insulin  (HCC)    followed by pcp;  dx 2016   (02-27-2023  per pt daughter pt   Wears glasses    Past Surgical History:  Procedure Laterality  Date   BICEPT TENODESIS Right 08/08/2023   Procedure: BICEPS TENODESIS;  Surgeon: Addie Cordella Hamilton, MD;  Location: [facility] OR;  Service: Orthopedics;  Laterality: Right;   BIOPSY  09/16/2020   Procedure: BIOPSY;  Surgeon: Eda Iha, MD;  Location: [facility];  Service: Gastroenterology;;   CHOLECYSTECTOMY N/A 03/20/2022   Procedure: LAPAROSCOPIC CHOLECYSTECTOMY;  Surgeon: Eletha Boas, MD;  Location: [facility];  Service: General;  Laterality: N/A;   COLONOSCOPY WITH PROPOFOL   05/10/2018   dr leigh   ESOPHAGOGASTRODUODENOSCOPY N/A 09/23/2015   Procedure: ESOPHAGOGASTRODUODENOSCOPY (EGD);  Surgeon: Gwendlyn ONEIDA Buddy, MD;  Location: [facility];  Service: Endoscopy;  Laterality: N/A;   ESOPHAGOGASTRODUODENOSCOPY (EGD) WITH PROPOFOL  N/A 09/16/2020   Procedure: ESOPHAGOGASTRODUODENOSCOPY (EGD) WITH PROPOFOL ;  Surgeon: Eda Iha, MD;  Location: [facility];  Service: Gastroenterology;  Laterality: N/A;   EXCISION MASS NECK Left 03/09/2023   Procedure: EXCISION SOFT TISSUE MASS LEFT POSTERIOR NECK;  Surgeon: Eletha Boas, MD;  Location: [facility];  Service: General;  Laterality: Left;   FOOT SURGERY Left 1999   INGUINAL HERNIA REPAIR Left 05/15/2018   Procedure:  LEFT INGUINAL HERNIA REPAIR WITH MESH;  Surgeon: Vanderbilt Ned, MD;  Location: [facility] OR;  Service: General;  Laterality: Left;   INSERTION OF MESH Left 05/15/2018   Procedure: INSERTION OF MESH;  Surgeon: Vanderbilt Ned, MD;  Location: [facility] OR;  Service: General;  Laterality: Left;   INTRAOPERATIVE CHOLANGIOGRAM N/A 03/20/2022   Procedure: INTRAOPERATIVE CHOLANGIOGRAM;  Surgeon: Eletha Boas, MD;  Location: [facility];  Service: General;  Laterality: N/A;   REVERSE SHOULDER ARTHROPLASTY Left 08/30/2022   Procedure: LEFT REVERSE SHOULDER ARTHROPLASTY;  Surgeon: Addie Cordella Hamilton, MD;  Location: [facility] OR;  Service: Orthopedics;  Laterality: Left;   REVERSE SHOULDER ARTHROPLASTY Right 08/08/2023   Procedure: RIGHT REVERSE SHOULDER ARTHROPLASTY;  Surgeon: Addie Cordella Hamilton, MD;  Location: [facility] OR;  Service: Orthopedics;  Laterality: Right;   SHOULDER ARTHROSCOPY W/ ROTATOR CUFF REPAIR Left    yrs ago   Family History  Problem Relation Age of Onset   Emphysema Mother        passed away on Jul 27, 2023   Diabetes Mother    Emphysema Father    Cancer Sister        Stomach cancer; pt says that pt had 12 sisters but the one w/ stomach cancer passed away   Diabetes Sister    Stomach cancer Sister    Diabetes Brother    Colon cancer Neg Hx    Colon polyps Neg Hx    Esophageal cancer Neg Hx    Rectal cancer Neg Hx    Social History   Socioeconomic History   Marital status: Married    Spouse name: Zoraida   Number of children: 5   Years of education: Not on file   Highest education level: 12th grade  Occupational History   Occupation: Employed    Associate Professor: [employer]  Tobacco Use   Smoking status: Former    Current packs/day: 0.00    Average packs/day: 0.2 packs/day for 54.0 years (8.1 ttl pk-yrs)    Types: Cigarettes    Start date: 62    Quit date: 07/20/2023    Years since quitting: 0.4   Smokeless tobacco: Never   Tobacco comments:    02-27-2023  per pt daughter pt is still  smoking, but hides how much per day,  smoking since age 76 (23)  Vaping Use   Vaping status: Never Used  Substance and Sexual Activity  Alcohol use: Not Currently    Alcohol/week: 2.0 standard drinks of alcohol    Types: 2 Cans of beer per week    Comment: 2 beers per week   Drug use: Never   Sexual activity: Yes  Other Topics Concern   Not on file  Social History Narrative   Lives with wife. 2025 Employed full time as a Designer, fashion/clothing.  6 children.   Social Drivers of Corporate investment banker Strain: High Risk (01/11/2024)   Overall Financial Resource Strain (CARDIA)    Difficulty of Paying Living Expenses: Very hard  Food Insecurity: Food Insecurity Present (01/11/2024)   Hunger Vital Sign    Worried About Running Out of Food in the Last Year: Sometimes true    Ran Out of Food in the Last Year: Sometimes true  Transportation Needs: No Transportation Needs (01/11/2024)   PRAPARE - Administrator, Civil Service (Medical): No    Lack of Transportation (Non-Medical): No  Physical Activity: Sufficiently Active (01/11/2024)   Exercise Vital Sign    Days of Exercise per Week: 7 days    Minutes of Exercise per Session: 30 min  Stress: No Stress Concern Present (01/11/2024)   [employer] of Occupational Health - Occupational Stress Questionnaire    Feeling of Stress: Not at all  Social Connections: Moderately Isolated (01/11/2024)   Social Connection and Isolation Panel    Frequency of Communication with Friends and Family: More than three times a week    Frequency of Social Gatherings with Friends and Family: More than three times a week    Attends Religious Services: Never    Database administrator or Organizations: No    Attends Banker Meetings: Never    Marital Status: Married    Tobacco Counseling Counseling given: Not Answered Tobacco comments: 02-27-2023  per pt daughter pt is still smoking, but hides how much per day,  smoking since age 47  (41)    Clinical Intake:  Pre-visit preparation completed: Yes  Pain : No/denies pain     Nutritional Risks: None Diabetes: Yes CBG done?: Yes (265 per pt) CBG resulted in Enter/ Edit results?: No Did pt. bring in CBG monitor from home?: No  Lab Results  Component Value Date   HGBA1C 7.2 (H) 12/14/2023   HGBA1C 6.7 (A) 08/02/2023   HGBA1C 7.5 (H) 03/09/2023     How often do you need to have someone help you when you read instructions, pamphlets, or other written materials from your doctor or pharmacy?: 4 - Often (daughter helps)  Interpreter Needed?: Yes Interpreter Agency: [facility] Interpreter Name: Marsa Manger Patient Declined Interpreter : No  Information entered by :: Vannie Hochstetler, RMA   Activities of Daily Living     01/11/2024    8:46 AM 12/13/2023   11:59 AM  In your present state of health, do you have any difficulty performing the following activities:  Hearing? 1   Comment Has some hearing loss-per pt/would like a hearing test   Vision? 0   Difficulty concentrating or making decisions? 0   Walking or climbing stairs? 0   Dressing or bathing? 0   Doing errands, shopping? 0 0  Comment children drives him around   Preparing Food and eating ? N   Using the Toilet? N   In the past six months, have you accidently leaked urine? N   Do you have problems with loss of bowel control? N   Managing your Medications? N  Managing your Finances? N   Housekeeping or managing your Housekeeping? N     Patient Care Team: Purcell Emil Schanz, MD as PCP - General (Internal Medicine) Szabat, Toribio BROCKS, [facility] (Inactive) as Pharmacist (Pharmacist)  I have updated your Care Teams any recent Medical Services you may have received from other providers in the past year.     Assessment:   This is a routine wellness examination for [facility].  Hearing/Vision screen Hearing Screening - Comments:: Has some hearing loss-per pt/would like a hearing test  Vision Screening -  Comments:: Wears eyeglasses/ Dr. Octavia   Goals Addressed   None    Depression Screen     01/11/2024    9:05 AM 12/21/2023    1:15 PM 10/24/2023    9:28 AM 10/03/2023    9:40 AM 08/02/2023   10:35 AM 04/17/2023   11:13 AM 12/28/2022    9:07 AM  PHQ 2/9 Scores  PHQ - 2 Score 0 0 0 0 3 0 0  PHQ- 9 Score 4    7  0    Fall Risk     01/11/2024    9:00 AM 12/21/2023    1:15 PM 11/13/2023    5:23 PM 10/24/2023    9:28 AM 10/11/2023    4:36 PM  Fall Risk   Falls in the past year? 1 0 0 0 0  Number falls in past yr: 0 0 0 0 0  Injury with Fall? 1 0 1 0   Comment shoulder surgery      Risk for fall due to :  No Fall Risks  No Fall Risks   Follow up Falls evaluation completed;Falls prevention discussed Falls evaluation completed  Falls evaluation completed     MEDICARE RISK AT HOME:  Medicare Risk at Home Any stairs in or around the home?: Yes (10 steps inside) If so, are there any without handrails?: No Home free of loose throw rugs in walkways, pet beds, electrical cords, etc?: Yes Adequate lighting in your home to reduce risk of falls?: Yes Life alert?: No Use of a cane, walker or w/c?: No Grab bars in the bathroom?: No Shower chair or bench in shower?: Yes Elevated toilet seat or a handicapped toilet?: No  TIMED UP AND GO:  Was the test performed?  Yes  Length of time to ambulate 10 feet: 10 sec Gait steady and fast without use of assistive device  Cognitive Function: Declined/Normal: No cognitive concerns noted by patient or family. Patient alert, oriented, able to answer questions appropriately and recall recent events. No signs of memory loss or confusion.        12/28/2022    9:08 AM 08/09/2021   10:32 AM  6CIT Screen  What Year? 0 points 0 points  What month? 0 points 0 points  What time? 0 points 0 points  Count back from 20 0 points 0 points  Months in reverse 0 points 0 points  Repeat phrase 10 points 0 points  Total Score 10 points 0 points     Immunizations Immunization History  Administered Date(s) Administered   Fluad Quad(high Dose 65+) 03/24/2021, 09/21/2022   Fluad Trivalent(High Dose 65+) 04/17/2023   Influenza,inj,Quad PF,6+ Mos 09/21/2015, 07/02/2018   Influenza-Unspecified 08/03/2020   PFIZER(Purple Top)SARS-COV-2 Vaccination 09/29/2019, 06/12/2020, 10/20/2020   PNEUMOCOCCAL CONJUGATE-20 12/29/2022   Pneumococcal Conjugate-13 09/21/2020   Pneumococcal Polysaccharide-23 10/15/2014, 09/21/2015    Screening Tests Health Maintenance  Topic Date Due   FOOT EXAM  Never done  Diabetic kidney evaluation - Urine ACR  Never done   Hepatitis C Screening  Never done   Zoster Vaccines- Shingrix (1 of 2) Never done   COVID-19 Vaccine (4 - 2024-25 season) 03/05/2023   Colonoscopy  05/11/2023   INFLUENZA VACCINE  02/02/2024   HEMOGLOBIN A1C  06/14/2024   OPHTHALMOLOGY EXAM  08/02/2024   Diabetic kidney evaluation - eGFR measurement  12/15/2024   Medicare Annual Wellness (AWV)  01/10/2025   Pneumococcal Vaccine: 50+ Years  Completed   Hepatitis B Vaccines  Aged Out   HPV VACCINES  Aged Out   Meningococcal B Vaccine  Aged Out   DTaP/Tdap/Td  Discontinued    Health Maintenance  Health Maintenance Due  Topic Date Due   FOOT EXAM  Never done   Diabetic kidney evaluation - Urine ACR  Never done   Hepatitis C Screening  Never done   Zoster Vaccines- Shingrix (1 of 2) Never done   COVID-19 Vaccine (4 - 2024-25 season) 03/05/2023   Colonoscopy  05/11/2023   Health Maintenance Items Addressed: Referral sent to GI for colonoscopy, Diabetic Foot Exam recommended, Labs Ordered: UACR and Hep C due, See Nurse Notes at the end of this note,    Additional Screening:  Vision Screening: Recommended annual ophthalmology exams for early detection of glaucoma and other disorders of the eye. Would you like a referral to an eye doctor? No    Dental Screening: Recommended annual dental exams for proper oral hygiene  Community  Resource Referral / Chronic Care Management: CRR required this visit?  Yes   CCM required this visit?  No   Plan:    I have personally reviewed and noted the following in the patient's chart:   Medical and social history Use of alcohol, tobacco or illicit drugs  Current medications and supplements including opioid prescriptions. Patient is not currently taking opioid prescriptions. Functional ability and status Nutritional status Physical activity Advanced directives List of other physicians Hospitalizations, surgeries, and ER visits in previous 12 months Vitals Screenings to include cognitive, depression, and falls Referrals and appointments  In addition, I have reviewed and discussed with patient certain preventive protocols, quality metrics, and best practice recommendations. A written personalized care plan for preventive services as well as general preventive health recommendations were provided to patient.   Elbert Spickler L Kermitt Harjo, CMA   01/11/2024   After Visit Summary: (MyChart) Due to this being a telephonic visit, the after visit summary with patients personalized plan was offered to patient via MyChart   Notes: Patient is due for a Shingrix and a Tdap vaccine.  He is also due for a FOOT EXAM, a Hep C screening and a UACR, which can be done during his up coming visit with Dr. Purcell.  He is due for a colonoscopy, which order has been placed today.  Patient is requesting a hearing test as he has had some hearing loss over the past year now.  A referral has been placed today.  Patient is requesting refills today.  I have sent a telephone encounter to PCP and nurse.

## 2024-01-15 NOTE — Therapy (Signed)
 OUTPATIENT PHYSICAL THERAPY CERVICAL TREATMENT/Discharge   Patient Name: Duane Cuevas MRN: [id number] DOB:1956/04/04, 68 y.o., male Today's Date: 01/16/2024  END OF SESSION:  PT End of Session - 01/16/24 1121     Visit Number 6    Number of Visits 13    Date for PT Re-Evaluation 02/09/24    Authorization Type [employer] MEDICARE HMO/PPO; MEDICAID OF [place]    PT Start Time 1108    PT Stop Time 1140    PT Time Calculation (min) 32 min    Activity Tolerance Patient tolerated treatment well    Behavior During Therapy WFL for tasks assessed/performed               Past Medical History:  Diagnosis Date   Cerebral aneurysm, nonruptured    followed by dr n. 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   Coronary artery disease    (pt admitted for chest pain ) nuclear stress test 11-02-2019  low risk, nuclear ef 58%,  prior inferior defect of severe severity indicative of previous MI;;   echo 04-28-2020 ef 60-65%, mild concentric LVH, RSVP 54.28mmHg;;   cardiac CTA 09-15-2020  unremarkable,  calcium  score=zero   Dependence on nocturnal oxygen  therapy    02-27-2023  per pt daughter,  pt only uses at night w/ O2 sat ,2-3L via [place],  only uses portable if going outside when every hot which is not much   Diverticulosis of colon    DOE (dyspnea on exertion)    02-27-2023  per pt daugter sob w/ stairs, incline's,  ok w/ some yard work if not warm/ hot wearther,  can do house hold chores   ED (erectile dysfunction)    Emphysema/COPD (HCC)    followed by pcp;   uses breztri  bid, uses preventil nebulizer every afternoon;   last exacerbation 06/ 2023 in epic   Full dentures    GERD (gastroesophageal reflux disease)    Hepatic steatosis    History of adenomatous polyp of colon    History of gastric ulcer    per EGD 09-16-2020  non-bleeding   History of MI (myocardial infarction)    per nuclear stress test in epic  11-02-2019 showed prior inferior defect of severe severity indicative of previous MI   History of pelvic fracture    1999  and 2009   Hyperlipidemia    Hypertension    Smokers' cough (HCC)    Type 2 diabetes mellitus treated with insulin  (HCC)    followed by pcp;  dx 2016   (02-27-2023  per pt daughter pt   Wears glasses    Past Surgical History:  Procedure Laterality Date   BICEPT TENODESIS Right 08/08/2023   Procedure: BICEPS TENODESIS;  Surgeon: Addie Cordella Hamilton, MD;  Location: [facility] OR;  Service: Orthopedics;  Laterality: Right;   BIOPSY  09/16/2020   Procedure: BIOPSY;  Surgeon: Eda Iha, MD;  Location: [facility];  Service: Gastroenterology;;   CHOLECYSTECTOMY N/A 03/20/2022   Procedure: LAPAROSCOPIC CHOLECYSTECTOMY;  Surgeon: Eletha Boas, MD;  Location: [facility];  Service: General;  Laterality: N/A;   COLONOSCOPY WITH PROPOFOL   05/10/2018   dr leigh   ESOPHAGOGASTRODUODENOSCOPY N/A 09/23/2015   Procedure: ESOPHAGOGASTRODUODENOSCOPY (EGD);  Surgeon: Gwendlyn ONEIDA Buddy, MD;  Location: [facility];  Service: Endoscopy;  Laterality: N/A;   ESOPHAGOGASTRODUODENOSCOPY (EGD) WITH PROPOFOL  N/A 09/16/2020   Procedure: ESOPHAGOGASTRODUODENOSCOPY (EGD) WITH PROPOFOL ;  Surgeon: Eda Iha, MD;  Location: [facility];  Service: Gastroenterology;  Laterality: N/A;   EXCISION MASS NECK Left 03/09/2023   Procedure: EXCISION SOFT TISSUE MASS LEFT POSTERIOR NECK;  Surgeon: Eletha Boas, MD;  Location: [facility];  Service: General;  Laterality: Left;   FOOT SURGERY Left 1999   INGUINAL HERNIA REPAIR Left 05/15/2018   Procedure: LEFT INGUINAL HERNIA REPAIR WITH MESH;  Surgeon: Vanderbilt Ned, MD;  Location: [facility];  Service: General;  Laterality: Left;   INSERTION OF MESH Left 05/15/2018   Procedure: INSERTION OF MESH;  Surgeon: Vanderbilt Ned, MD;  Location: [facility];  Service: General;  Laterality: Left;   INTRAOPERATIVE CHOLANGIOGRAM N/A 03/20/2022   Procedure: INTRAOPERATIVE  CHOLANGIOGRAM;  Surgeon: Eletha Boas, MD;  Location: [facility];  Service: General;  Laterality: N/A;   REVERSE SHOULDER ARTHROPLASTY Left 08/30/2022   Procedure: LEFT REVERSE SHOULDER ARTHROPLASTY;  Surgeon: Addie Cordella Hamilton, MD;  Location: [facility];  Service: Orthopedics;  Laterality: Left;   REVERSE SHOULDER ARTHROPLASTY Right 08/08/2023   Procedure: RIGHT REVERSE SHOULDER ARTHROPLASTY;  Surgeon: Addie Cordella Hamilton, MD;  Location[facility] OR;  Service: Orthopedics;  Laterality: Right;   SHOULDER ARTHROSCOPY W/ ROTATOR CUFF REPAIR Left    yrs ago   Patient Active Problem List   Diagnosis Date Noted   Hypocalcemia 12/13/2023   Essential hypertension 10/07/2023   Arthritis of right shoulder region 08/20/2023   S/P reverse total shoulder arthroplasty, 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 GOLD III/ active smoker 08/14/2018   Hypokalemia 09/23/2015   COPD (chronic obstructive pulmonary disease) (HCC) 10/14/2014   Dyslipidemia associated with type 2 diabetes mellitus (HCC) 10/14/2014   PCP: Purcell Emil Schanz, MD   REFERRING PROVIDER: Addie Cordella Hamilton, MD   REFERRING DIAG:  [phone] (ICD-10-CM) - Cervical radiculopathy  M54.2 (ICD-10-CM) - Neck pain    THERAPY DIAG:  Cervicalgia  Muscle weakness  Rationale for Evaluation and Treatment: Rehabilitation  ONSET DATE: Over 6 months  SUBJECTIVE:   SUBJECTIVE STATEMENT: Pt reports his neck pain has resolved, and is doing significantly better.  EVAL: Pt reports he has been  experiencing neck, R shoulder pain and headaches for over 6 months. The pain is worse when trying to sleep and he has mod pain with neck movements, and min to no pain when he holds his head still.  PERTINENT HISTORY: L and R rTSA, see PMH  PAIN: 01/16/2024  0/10 Are you having pain? Yes: NPRS scale: 0/10 no pain at all. Pain location: neck, R shoulder pain and headaches Pain description: ache, pressure Aggravating factors: Sleeping, neck movements Relieving factors: Rest  PRECAUTIONS: None  RED FLAGS: None   WEIGHT BEARING RESTRICTIONS: No  FALLS:  Has patient fallen in last 6 months? No  LIVING ENVIRONMENT: Lives with: lives with their family Lives in: House/apartment Able to access home  OCCUPATION: Retired  PLOF: Independent  PATIENT GOALS: Pain relief  NEXT MD VISIT: Not scheduled  OBJECTIVE:  Note: Objective measures were completed at Evaluation unless otherwise noted.  DIAGNOSTIC FINDINGS:  IMPRESSION: 1. Chronic fusion across the C6-7 disc space. Mild narrowing of the ventral subarachnoid space but no compressive effect  upon the cord. Mild bilateral foraminal stenosis, not grossly compressive. 2. C3-4: Bilateral uncovertebral osteophytes. Bilateral foraminal stenosis could affect either C4 nerve. 3. C4-5: Endplate osteophytes and bulging of the disc. Bilateral uncovertebral osteophytes. Bilateral foraminal stenosis could affect either C5 nerve. 4. C5-6: Bilateral uncovertebral hypertrophy. Bilateral foraminal stenosis could affect either C6 nerve.  PATIENT SURVEYS:  NDI: 40/50=80%  COGNITION: Overall cognitive status: Within functional limits for tasks assessed  SENSATION: WFL  POSTURE: rounded shoulders, forward head, and decreased thoracic kyphosis  PALPATION: TTP to the upper traps and suboccipital muscles   CERVICAL ROM:   Active ROM A/PROM (deg) eval AROM 01/11/24  Flexion 50 pressure   Extension 45   Right lateral flexion 30  pain/pressure 40 deg with stretch   Left lateral flexion 40 pressure 40 with stretch  Right rotation 30 pain/pressure 55 tight  Left rotation 40 pressure 60 tight   (Blank rows = not tested)  UPPER EXTREMITY ROM:  WFLs Active ROM Right eval Left eval  Shoulder flexion    Shoulder extension    Shoulder abduction    Shoulder adduction    Shoulder extension    Shoulder internal rotation    Shoulder external rotation    Elbow flexion    Elbow extension    Wrist flexion    Wrist extension    Wrist ulnar deviation    Wrist radial deviation    Wrist pronation    Wrist supination     (Blank rows = not tested)  UPPER EXTREMITY MMT: UE myotome negative MMT Right eval Left eval  Shoulder flexion    Shoulder extension    Shoulder abduction    Shoulder adduction    Shoulder extension    Shoulder internal rotation    Shoulder external rotation    Middle trapezius    Lower trapezius    Elbow flexion    Elbow extension    Wrist flexion    Wrist extension    Wrist ulnar deviation    Wrist radial deviation    Wrist pronation    Wrist supination    Grip strength 25/30=27.5 60/70=65   (Blank rows = not tested)  CERVICAL SPECIAL TESTS:  Spurling's test: Positive  TREATMENT DATE:  [facility] Adult PT Treatment:                                                DATE: 01/16/24. Therapeutic Exercise/Activities: Standing chin tucks x10 3 Standing cervical rotation x5 5 Standing cervical SB c chin tuck Standing shoulder horz abd x15 RTB Standing shoulder ER x15 RTB Standing shoulder row x15 GTB NDI 0/50=0% Final HEP  [facility] Adult PT Treatment:                                                DATE: 01/11/24 Therapeutic Exercise: UBE 5 min level 1  Supine chin tuck Supine head lift x 5  Supine chest lift x 10 , 5 lbs  Single arm chest press x 10 , 5 lbs  Seated green band horizontal pull Narrow OH lift with green band used wall for feedback  Scapular protraction/retraction at wall   ER green band x 15 at wall  Shoulder flexion 3 lbs x 10  Scaption 3 lbs x 10  Cervical lateral flexion stretching x 3 x 20 sec each side  [facility] Adult PT Treatment:                                                DATE: 01/09/24 Manual Therapy: STM to the upper trap, levator, cervical paraspinals, and suboccipitals. TPR to the L upper trap. Therapeutic Exercise: Supine chin tucks x10 3 Supine DNF lifts x10 3 Supine shoulder horz abd x15 GTB Supine shoulder horz ER x15 GTB Supine cervical rotation x2 15 Supine cervical SB x2 15 Seated chin tuck c cervical SB x2 15                                                                                                                   PATIENT EDUCATION:  Education details: Eval findings, POC, HEP, self care  Person educated: Patient Education method: Explanation, Demonstration, Tactile cues, Verbal cues, and Handouts Education comprehension: verbalized understanding, returned demonstration, verbal cues required, and tactile cues required  HOME EXERCISE PROGRAM: Access Code: [email or web address] URL: [email or web address] Date: 01/16/2024 Prepared by: Dasie Daft  Exercises - Supine Cervical Retraction with Towel  - 2 x daily - 7 x weekly - 1 sets - 10 reps - 3 hold - Supine DNF Liftoffs  - 2 x daily - 7 x weekly - 1 sets - 10 reps - 3 hold - Supine Cervical Rotation AROM on Pillow  - 2 x daily - 7 x weekly - 1 sets - 5 reps - 5 hold - Standing Cervical Retraction with Sidebending  - 2 x daily - 7 x weekly - 1 sets - 10 reps - 5 hold - Cervical Retraction at Wall  - 1 x daily - 7 x weekly - 1 sets - 5-10 reps - 5 hold - Seated Cervical Rotation AROM  - 1 x daily - 7 x weekly - 1 sets - 5 reps - 5 hold - Standing Shoulder Row with Anchored Resistance  - 1 x daily - 7 x weekly - 2 sets - 10 reps - 3 hold - Standing Shoulder Horizontal Abduction with Resistance  - 1 x daily - 7 x weekly - 2 sets - 10 reps - 3 hold - Standing Shoulder  External Rotation with Resistance  - 1 x daily - 7 x weekly - 2 sets - 10 reps - 3 hold  ASSESSMENT:  CLINICAL IMPRESSION: Pt presented to PT today reporting 0/10 neck pain and requesting to be Dced from PT services. Pt has MET all goals and is Ind in a HEP to maintain his achieved LOF.   EVAL: Patient is a 68 y.o. male who was seen today for physical therapy evaluation and treatment for  M54.12 (ICD-10-CM) - Cervical radiculopathy  M54.2 (ICD-10-CM) - Neck pain  Pt presents with decreased neck ROM and significantly increased pain when trying  to sleep and with neck movements. Pain is minimal or resolves when holding his head still. Pt experiences radicular type pain to his R upper shoulder. UE myotome screen was negative although R grip strength is significantly less than the L. Pt reports pain with R hand grip strength testing. Cervical MRI reveals marked degenerative changes. A HEP was initiated to address posture, and postural/posterior chain strengthening. Pt reported greater ease of neck mobility after completing the HEP. Pt will benefit from skilled PT to address impairments to optimize function with less pain.   OBJECTIVE IMPAIRMENTS: decreased activity tolerance, decreased mobility, decreased ROM, decreased strength, increased muscle spasms, postural dysfunction, and pain.   ACTIVITY LIMITATIONS: lifting, sleeping, reach over head, and caring for others  PARTICIPATION LIMITATIONS: cleaning, community activity, and yard work  PERSONAL FACTORS: Past/current experiences, Time since onset of injury/illness/exacerbation, and 1 comorbidity: bilat rTSA are also affecting patient's functional outcome.   REHAB POTENTIAL: Good  CLINICAL DECISION MAKING: Evolving/moderate complexity  EVALUATION COMPLEXITY: Moderate   GOALS:  SHORT TERM GOALS = LTGs  LONG TERM GOALS: Target date: 02/09/24  Pt will be Ind in a final HEP to maintain achieved LOF Baseline: started Goal status: MET  2.  Pt  will report 50% or greater improvement in his neck pain with neck movements and with sleeping Baseline: 4-10/10 01/03/24: 50% improvement Goal status: MET  3.  Increase neck rotation by 10d for improved mobility with driving and daily activities Baseline:  01/03/24: see ROM flow sheet Goal status: MET  4.  Increase R grip strength by 10# as indication of improved pain and for improved function Baseline: 27.5# 01/09/24: 80, 80=80# Goal status: MET  5.  Pt's NDI score will improve by the MCID to 65% as indication of improved function  Baseline:80%  01/16/24: 0/50=0% disability Goal status: MET   PLAN:  PT FREQUENCY: 2x/week  PT DURATION: 6 weeks  PLANNED INTERVENTIONS: 97164- PT Re-evaluation, 97110-Therapeutic exercises, 97530- Therapeutic activity, 97112- Neuromuscular re-education, 97535- Self Care, 02859- Manual therapy, [id number]- Electrical stimulation (unattended), 02987- Traction (mechanical), 20560 (1-2 muscles), 20561 (3+ muscles)- Dry Needling, Patient/Family education, Taping, Joint mobilization, Spinal mobilization, Cryotherapy, and Moist heat  PLAN FOR NEXT SESSION: Assess response to HEP; progress therex as indicated; use of modalities, manual therapy; and TPDN as indicated.  PHYSICAL THERAPY DISCHARGE SUMMARY  Visits from Start of Care: 6  Current functional level related to goals / functional outcomes: See clinical impression and PT goals   Remaining deficits: See clinical impression and PT goals   Education / Equipment: HEP/PT Ed   Patient agrees to discharge. Patient goals were met. Patient is being discharged due to being pleased with the current functional level.  Jaskarn Schweer MS, PT 01/16/24 11:51 AM

## 2024-01-16 ENCOUNTER — Ambulatory Visit

## 2024-01-16 DIAGNOSIS — M6281 Muscle weakness (generalized): Secondary | ICD-10-CM | POA: Diagnosis not present

## 2024-01-16 DIAGNOSIS — M25511 Pain in right shoulder: Secondary | ICD-10-CM | POA: Diagnosis not present

## 2024-01-16 DIAGNOSIS — M542 Cervicalgia: Secondary | ICD-10-CM

## 2024-01-17 ENCOUNTER — Telehealth: Payer: Self-pay

## 2024-01-17 NOTE — Progress Notes (Signed)
 Complex Care Management Note Care Guide Note  01/17/2024 Name: Duane Cuevas MRN: [id number] DOB: [birth year]   Complex Care Management Outreach Attempts: An unsuccessful telephone outreach was attempted today to offer the patient information about available complex care management services.  Follow Up Plan:  Additional outreach attempts will be made to offer the patient complex care management information and services.   Encounter Outcome:  No Answer  Jeoffrey Buffalo , RMA     [place]  [facility], [facility] Guide  Direct Dial : [phone]  Website: [email or web address]

## 2024-01-18 ENCOUNTER — Ambulatory Visit

## 2024-01-19 NOTE — Progress Notes (Signed)
 Complex Care Management Note Care Guide Note  01/19/2024 Name: Duane Cuevas MRN: [id number] DOB: 09/03/1955   Complex Care Management Outreach Attempts: A second unsuccessful outreach was attempted today to offer the patient with information about available complex care management services.  Follow Up Plan:  Additional outreach attempts will be made to offer the patient complex care management information and services.   Encounter Outcome:  No Answer  Jeoffrey Buffalo , RMA     [place]  [facility], [facility] Guide  Direct Dial : [phone]  Website: [email or web address]  x

## 2024-01-23 NOTE — Progress Notes (Signed)
 Complex Care Management Note Care Guide Note  01/23/2024 Name: Duane Cuevas MRN: [id number] DOB: [birth year]   Complex Care Management Outreach Attempts: A third unsuccessful outreach was attempted today to offer the patient with information about available complex care management services.  Follow Up Plan:  Additional outreach attempts will be made to offer the patient complex care management information and services.   Encounter Outcome:  No Answer  Jeoffrey Buffalo , RMA     [place]  [facility], [facility] Guide  Direct Dial : [phone]  Website: [email or web address]

## 2024-01-25 ENCOUNTER — Other Ambulatory Visit: Payer: Self-pay | Admitting: Emergency Medicine

## 2024-02-02 DIAGNOSIS — J441 Chronic obstructive pulmonary disease with (acute) exacerbation: Secondary | ICD-10-CM | POA: Diagnosis not present

## 2024-02-05 ENCOUNTER — Ambulatory Visit (INDEPENDENT_AMBULATORY_CARE_PROVIDER_SITE_OTHER): Admitting: Pulmonary Disease

## 2024-02-05 ENCOUNTER — Encounter: Payer: Self-pay | Admitting: Pulmonary Disease

## 2024-02-05 VITALS — BP 112/76 | HR 88 | Temp 98.2°F | Ht 68.0 in | Wt 168.6 lb

## 2024-02-05 DIAGNOSIS — Z9189 Other specified personal risk factors, not elsewhere classified: Secondary | ICD-10-CM

## 2024-02-05 DIAGNOSIS — Z87891 Personal history of nicotine dependence: Secondary | ICD-10-CM

## 2024-02-05 DIAGNOSIS — J449 Chronic obstructive pulmonary disease, unspecified: Secondary | ICD-10-CM

## 2024-02-05 DIAGNOSIS — R0602 Shortness of breath: Secondary | ICD-10-CM | POA: Diagnosis not present

## 2024-02-05 DIAGNOSIS — I272 Pulmonary hypertension, unspecified: Secondary | ICD-10-CM | POA: Diagnosis not present

## 2024-02-05 LAB — NITRIC OXIDE: Nitric Oxide: 14

## 2024-02-05 NOTE — Patient Instructions (Signed)
 VISIT SUMMARY:  Today, we discussed your chronic obstructive pulmonary disease (COPD) and other related health concerns. You have been managing your COPD with Breztri  and albuterol  inhalers, and you have noticed improvements since quitting smoking. We also addressed your sleep disturbances and the possibility of obstructive sleep apnea. Additionally, we reviewed your history of smoking and the need for lung cancer screening.  YOUR PLAN:  -CHRONIC OBSTRUCTIVE PULMONARY DISEASE (COPD): COPD is a chronic lung condition that makes it hard to breathe. You should continue using Breztri  twice daily and albuterol  as needed. We will also schedule pulmonary function tests to better understand your lung function and enroll you in a lung cancer screening program with annual CT scans.  -PULMONARY HYPERTENSION: Pulmonary hypertension is high blood pressure in the lungs' arteries, which can contribute to sleep disturbances. We need to ensure your cardiac evaluations are up to date.  -POSSIBLE OBSTRUCTIVE SLEEP APNEA: Obstructive sleep apnea is a condition where your breathing stops and starts during sleep, often due to airway blockage. We will order a home sleep apnea study to confirm this diagnosis.  -PERSONAL HISTORY OF NICOTINE  DEPENDENCE: You have a significant history of smoking but quit 4-5 months ago. Due to your smoking history, it is important to participate in lung cancer screening. We will enroll you in a program for annual CT scans.  INSTRUCTIONS:  Please continue using Breztri  twice daily and albuterol  as needed. We will schedule pulmonary function tests and a home sleep apnea study. Ensure your cardiac evaluations are up to date. You will also be enrolled in a lung cancer screening program with annual CT scans.

## 2024-02-05 NOTE — Progress Notes (Signed)
 Subjective:    Patient ID: Duane Cuevas, male    DOB: [birth year], 68 y.o.   MRN: [id number]  Patient Care Team: Purcell Emil Schanz, MD as PCP - General (Internal Medicine) Rozella Toribio BROCKS, [facility] (Inactive) as Pharmacist (Pharmacist)  Chief Complaint  Patient presents with   Consult    Shortness of breath on exertion and at rest.     BACKGROUND: The patient is a 68 year old current smoker, with a history as noted below, who presents for evaluation of shortness of breath on exertion in the setting of COPD.  He is kindly referred by Dr. Emil Balding Sagardia.  HPI Discussed the use of AI scribe software for clinical note transcription with the patient, who gave verbal consent to proceed.  History of Present Illness   Duane Cuevas is a 68 year old male with COPD who presents for evaluation of his respiratory condition. He was referred by his doctor for evaluation of his COPD.  He has a history of COPD and uses Breztri  twice daily and albuterol  as needed, sometimes up to four times a day. He quit smoking approximately four to five months ago after previously smoking half a pack a day, increasing to two packs when drinking alcohol. He no longer drinks alcohol.  He experienced a daily cough which resolved after quitting smoking. No history of asthma and has not undergone pulmonary function tests previously. A chest X-ray in June showed COPD changes.  He experiences sleep disturbances, stating he falls asleep but wakes up after an hour and struggles to return to sleep. He acknowledges snoring but is unsure what wakes him up. He sometimes sleeps well but often remains awake after initially waking up.  He experiences shortness of breath, which is managed with inhalers. No current cough and no history of asthma.   Previously employed as a Designer, fashion/clothing for approximately 13 years.  No military history.    Review of Systems A 10 point review of systems was performed and it is as noted above  otherwise negative.   Past Medical History:  Diagnosis Date   Cerebral aneurysm, nonruptured    followed by dr n. 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   Coronary artery disease    (pt admitted for chest pain ) nuclear stress test 11-02-2019  low risk, nuclear ef 58%,  prior inferior defect of severe severity indicative of previous MI;;   echo 04-28-2020 ef 60-65%, mild concentric LVH, RSVP 54.52mmHg;;   cardiac CTA 09-15-2020  unremarkable,  calcium  score=zero   Dependence on nocturnal oxygen  therapy    02-27-2023  per pt daughter,  pt only uses at night w/ O2 sat ,2-3L via [place],  only uses portable if going outside when every hot which is not much   Diverticulosis of colon    DOE (dyspnea on exertion)    02-27-2023  per pt daugter sob w/ stairs, incline's,  ok w/ some yard work if not warm/ hot wearther,  can do house hold chores   ED (erectile dysfunction)    Emphysema/COPD (HCC)    followed by pcp;   uses breztri  bid, uses preventil nebulizer every afternoon;   last exacerbation 06/ 2023 in epic   Full dentures    GERD (gastroesophageal reflux disease)    Hepatic steatosis    History of adenomatous polyp of colon    History of gastric ulcer    per EGD 09-16-2020  non-bleeding   History  of MI (myocardial infarction)    per nuclear stress test in epic 11-02-2019 showed prior inferior defect of severe severity indicative of previous MI   History of pelvic fracture    1999  and 2009   Hyperlipidemia    Hypertension    Smokers' cough (HCC)    Type 2 diabetes mellitus treated with insulin  (HCC)    followed by pcp;  dx 2016   (02-27-2023  per pt daughter pt   Wears glasses     Past Surgical History:  Procedure Laterality Date   BICEPT TENODESIS Right 08/08/2023   Procedure: BICEPS TENODESIS;  Surgeon: Addie Cordella Hamilton, MD;  Location: [facility];  Service: Orthopedics;  Laterality: Right;   BIOPSY   09/16/2020   Procedure: BIOPSY;  Surgeon: Eda Iha, MD;  Location: [facility];  Service: Gastroenterology;;   CHOLECYSTECTOMY N/A 03/20/2022   Procedure: LAPAROSCOPIC CHOLECYSTECTOMY;  Surgeon: Eletha Boas, MD;  Location: [facility];  Service: General;  Laterality: N/A;   COLONOSCOPY WITH PROPOFOL   05/10/2018   dr leigh   ESOPHAGOGASTRODUODENOSCOPY N/A 09/23/2015   Procedure: ESOPHAGOGASTRODUODENOSCOPY (EGD);  Surgeon: Gwendlyn ONEIDA Buddy, MD;  Location: [facility];  Service: Endoscopy;  Laterality: N/A;   ESOPHAGOGASTRODUODENOSCOPY (EGD) WITH PROPOFOL  N/A 09/16/2020   Procedure: ESOPHAGOGASTRODUODENOSCOPY (EGD) WITH PROPOFOL ;  Surgeon: Eda Iha, MD;  Location: [facility];  Service: Gastroenterology;  Laterality: N/A;   EXCISION MASS NECK Left 03/09/2023   Procedure: EXCISION SOFT TISSUE MASS LEFT POSTERIOR NECK;  Surgeon: Eletha Boas, MD;  Location: [facility];  Service: General;  Laterality: Left;   FOOT SURGERY Left 1999   INGUINAL HERNIA REPAIR Left 05/15/2018   Procedure: LEFT INGUINAL HERNIA REPAIR WITH MESH;  Surgeon: Vanderbilt Ned, MD;  Location: [facility];  Service: General;  Laterality: Left;   INSERTION OF MESH Left 05/15/2018   Procedure: INSERTION OF MESH;  Surgeon: Vanderbilt Ned, MD;  Location: [facility];  Service: General;  Laterality: Left;   INTRAOPERATIVE CHOLANGIOGRAM N/A 03/20/2022   Procedure: INTRAOPERATIVE CHOLANGIOGRAM;  Surgeon: Eletha Boas, MD;  Location: [facility];  Service: General;  Laterality: N/A;   REVERSE SHOULDER ARTHROPLASTY Left 08/30/2022   Procedure: LEFT REVERSE SHOULDER ARTHROPLASTY;  Surgeon: Addie Cordella Hamilton, MD;  Location: [facility];  Service: Orthopedics;  Laterality: Left;   REVERSE SHOULDER ARTHROPLASTY Right 08/08/2023   Procedure: RIGHT REVERSE SHOULDER ARTHROPLASTY;  Surgeon: Addie Cordella Hamilton, MD;  Location: [facility] OR;  Service: Orthopedics;  Laterality: Right;   SHOULDER ARTHROSCOPY W/ ROTATOR CUFF REPAIR Left    yrs ago    Patient  Active Problem List   Diagnosis Date Noted   Hypocalcemia 12/13/2023   Essential hypertension 10/07/2023   Arthritis of right shoulder region 08/20/2023   S/P reverse total shoulder arthroplasty, 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 GOLD III/ active smoker 08/14/2018   Hypokalemia 09/23/2015   COPD (chronic obstructive pulmonary disease) (HCC) 10/14/2014   Dyslipidemia associated with type 2 diabetes mellitus (HCC) 10/14/2014    Family History  Problem Relation Age of Onset   Emphysema Mother        passed away on 08-03-2023  Diabetes Mother    Emphysema Father    Cancer Sister        Stomach cancer; pt says that pt had 12 sisters but the one w/ stomach cancer passed away   Diabetes Sister    Stomach cancer Sister    Diabetes Brother    Colon cancer Neg Hx    Colon polyps Neg Hx    Esophageal cancer Neg Hx    Rectal cancer Neg Hx     Social History   Tobacco Use   Smoking status: Former    Current packs/day: 0.00    Average packs/day: 0.2 packs/day for 54.0 years (8.1 ttl pk-yrs)    Types: Cigarettes    Start date: 50    Quit date: 07/20/2023    Years since quitting: 0.5   Smokeless tobacco: Never   Tobacco comments:    02-27-2023  per pt daughter pt is still smoking, but hides how much per day,  smoking since age 66 (70)  Substance Use Topics   Alcohol use: Not Currently    Alcohol/week: 2.0 standard drinks of alcohol    Types: 2 Cans of beer per week    Comment: 2 beers per week    No Known  Allergies  Current Meds  Medication Sig   albuterol  (PROVENTIL ) (2.5 MG/3ML) 0.083% nebulizer solution INHALE ONE VIAL VIA NEBULIZER EVERY 6 HOURS AS NEEDED FOR WHEEZING OR FOR SHORTNESS OF BREATH   albuterol  (VENTOLIN  HFA) 108 (90 Base) MCG/ACT inhaler 2 inhalaciones Inhalaci n Cada 6 horas PRN, sibilancias, dificultad para respirar   amLODipine  (NORVASC ) 5 MG tablet Take 1 tablet (5 mg total) by mouth daily. TOMAR 1 TABLETA POR VAI ORAL UNA VEZ AL DIA   aspirin  EC 81 MG tablet Take 1 tablet (81 mg total) by mouth daily. Swallow whole.   atorvastatin  (LIPITOR) 20 MG tablet Take 1 tablet (20 mg total) by mouth daily. TOMAR 1 TABLETA POR VIA ORAL UNA VEZ AL DIA   Budeson-Glycopyrrol-Formoterol  (BREZTRI  AEROSPHERE) 160-9-4.8 MCG/ACT AERO INHALAR 2 BOCANADAS POR VIA ORAL DOS VECES AL DIA   cyclobenzaprine  (FLEXERIL ) 10 MG tablet Take 1 tablet (10 mg total) by mouth 2 (two) times daily as needed for muscle spasms.   Dulaglutide  (TRULICITY ) 1.5 MG/0.5ML SOAJ INYECTAR CONTENIDOS DE UN LAPIZ POR VIA SUBCUTANEA CADA SEMANA, EL MISMO DIA CADA SEMANA   FARXIGA  10 MG TABS tablet TOMAR 1 TABLETA POR VIA ORAL UNA VEZ AL DIA   gabapentin  (NEURONTIN ) 300 MG capsule Take 1 capsule (300 mg total) by mouth at bedtime as needed.   glipiZIDE  (GLUCOTROL ) 10 MG tablet TOMAR 1 TABLETA POR VIA ORAL UNA VEZ AL DIA ANTES DEL DESAYUNO *TOMAR 1 TABLETA ADICIONAL SI AZUCAR EN LA SANGRE ESTA ALTA*   insulin  aspart (NOVOLOG  FLEXPEN) 100 UNIT/ML FlexPen INYECTAR 3 UNIDADES POR VIA SUBCUTANEA TRES VECES AL DIA COMO INDICADO. AJUSTAR CANTIDAD DE INSULINA POR ESCALA MOVIL. MAS DOSIS 30 UNIDADES   mupirocin  ointment (BACTROBAN ) 2 % Apply 1 Application topically daily. 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  Follow discharge instructions on paperwork for duration   pantoprazole  (PROTONIX ) 40 MG tablet Take 1 tablet (40 mg total) by mouth daily. TOMAR 1 TABLETA POR VIA ORAL UNA VEZ AL DIA   sildenafil  (VIAGRA ) 100 MG  tablet Take 0.5-1 tablets (50-100 mg total) by mouth daily as needed for erectile dysfunction.   TRESIBA  FLEXTOUCH 100 UNIT/ML FlexTouch Pen INYECTAR 25 UNIDADES POR VIA SUBCUTANEA DIARIAMENTE    Immunization History  Administered  Date(s) Administered   Fluad Quad(high Dose 65+) 03/24/2021, 09/21/2022   Fluad Trivalent(High Dose 65+) 04/17/2023   Influenza,inj,Quad PF,6+ Mos 09/21/2015, 07/02/2018   Influenza-Unspecified 08/03/2020   PFIZER(Purple Top)SARS-COV-2 Vaccination 09/29/2019, 06/12/2020, 10/20/2020   PNEUMOCOCCAL CONJUGATE-20 12/29/2022   Pneumococcal Conjugate-13 09/21/2020   Pneumococcal Polysaccharide-23 10/15/2014, 09/21/2015        Objective:     BP 112/76 (BP Location: Right Arm, Patient Position: Sitting, Cuff Size: Normal)   Pulse 88   Temp 98.2 F (36.8 C) (Oral)   Ht 5' 8 (1.727 m)   Wt 168 lb 9.6 oz (76.5 kg)   SpO2 97%   BMI 25.64 kg/m   SpO2: 97 %  GENERAL: Well-developed, well-nourished gentleman, no acute distress.  No conversational dyspnea, fully ambulatory. HEAD: Normocephalic, atraumatic.  EYES: Pupils equal, round, reactive to light.  No scleral icterus.  MOUTH: Edentulous, wears dentures, oral mucosa moist.  No thrush.  Mallampati I airway NECK: Supple. No thyromegaly. Trachea midline. No JVD.  No adenopathy. PULMONARY: Good air entry bilaterally.  Coarse, otherwise, no adventitious sounds. CARDIOVASCULAR: S1 and S2. Regular rate and rhythm.  ABDOMEN: Benign. MUSCULOSKELETAL: No joint deformity, no clubbing, no edema.  NEUROLOGIC: No overt focal deficit, no gait disturbance, speech is fluent. SKIN: Intact,warm,dry. PSYCH: Mood and behavior normal.  Chest x-ray performed 13 December 2023 showing COPD changes with some increased interstitial markings:   Echocardiogram performed 14 December 2023 reviewed: Showed LVEF 65 to 70%, no wall motion abnormalities.  RV systolic function normal.  Normal RV size.  Moderately elevated pulmonary artery  systolic pressure.  Inferior vena cava is dilated suggesting right atrial pressure 15 mmHg.  Lab Results  Component Value Date   NITRICOXIDE 14 02/05/2024  *No evidence of type II inflammation   Assessment & Plan:     ICD-10-CM   1. Shortness of breath  R06.02 Nitric oxide     Pulmonary function test    2. History of tobacco use, presenting hazards to health  Z87.891 Ambulatory Referral for Lung Cancer Scre    3. COPD suggested by initial evaluation (HCC)  J44.9 Pulmonary function test    4. At risk for sleep apnea  Z91.89 Home sleep test    5. Pulmonary HTN (HCC)  I27.20 Pulmonary function test      Orders Placed This Encounter  Procedures   Ambulatory Referral for Lung Cancer Scre    Referral Priority:   Routine    Referral Type:   Consultation    Referral Reason:   Specialty Services Required    Number of Visits Requested:   1   Nitric oxide    Pulmonary function test    Standing Status:   Future    Expiration Date:   02/04/2025    Scheduling Instructions:     May do on OV    Where should this test be performed?:   Outpatient Pulmonary    What type of PFT is being ordered?:   Full PFT   Home sleep test    Standing Status:   Future    Expected Date:   02/19/2024    Expiration Date:   02/04/2025    Where should this test be performed::   [facility] - Pulmonary    Discussion:   Chronic obstructive pulmonary disease (COPD) COPD managed with Breztri  and albuterol  inhalers. Reports symptom improvement since quitting smoking 4-5 months ago. Last chest X-ray in June showed COPD changes. Pulmonary function tests have not been performed previously.  Nitric oxide  test did  not show significant type II inflammation so, less likely asthma. - Continue Breztri  twice daily - Continue albuterol  as needed - Order pulmonary function tests - Enroll in lung cancer screening program with annual CT scan  Pulmonary hypertension Pulmonary hypertension identified on echocardiogram in June. Possible  contributor to sleep disturbance (potential OSA) versus COPD.  May need right heart cath. - Obtaining studies PFT/HST  Possible obstructive sleep apnea Reports sleep disturbances with frequent awakenings and snoring. Suspected obstructive sleep apnea which may be contributing to pulmonary hypertension. Home sleep apnea study planned for confirmation. - Order home sleep apnea study  Personal history of nicotine  dependence Significant smoking history since age 62-14, quit 4-5 months ago. Previously smoked up to two packs per day when drinking. Smoking history warrants lung cancer screening.      Advised if symptoms do not improve or worsen, to please contact office for sooner follow up or seek emergency care.    I spent 60 minutes of dedicated to the care of this patient on the date of this encounter to include pre-visit review of records, face-to-face time with the patient discussing conditions above, post visit ordering of testing, clinical documentation with the electronic health record, making appropriate referrals as documented, and communicating necessary findings to members of the patients care team.   C. Leita Sanders, MD Advanced Bronchoscopy PCCM [facility]    *This note was dictated using voice recognition software/Dragon.  Despite best efforts to proofread, errors can occur which can change the meaning. Any transcriptional errors that result from this process are unintentional and may not be fully corrected at the time of dictation.

## 2024-02-06 ENCOUNTER — Telehealth: Payer: Self-pay

## 2024-02-06 NOTE — Telephone Encounter (Signed)
 Copied from CRM [id number]. Topic: General - Call Back - No Documentation >> Feb 05, 2024  4:22 PM Rea C wrote: Reason for CRM: [place] from [employer] is calling for patient's most recent chart notes and history. She said she sent the fax over today for the information. [phone].

## 2024-02-07 NOTE — Telephone Encounter (Signed)
 Office note printed and will be faxed

## 2024-02-08 NOTE — Telephone Encounter (Signed)
 Copied from CRM #[id number]. Topic: General - Other >> Feb 08, 2024  2:07 PM Mercedes MATSU wrote: Reason for CRM: [employer] rep called in requesting to speak with Suzen in regards to the patients medical supply. Patients continuous glucose monitor, fax was sent over requesting for current chart notes documenting that the patient is using the current glucose monitor.

## 2024-02-09 NOTE — Telephone Encounter (Signed)
 Faxed office notes and history, 2x fax confirmations state no answer. Faxing a 3rd time

## 2024-02-13 ENCOUNTER — Other Ambulatory Visit: Payer: Self-pay | Admitting: Emergency Medicine

## 2024-02-14 ENCOUNTER — Ambulatory Visit: Admitting: Emergency Medicine

## 2024-02-14 ENCOUNTER — Ambulatory Visit: Payer: Self-pay | Admitting: Emergency Medicine

## 2024-02-14 ENCOUNTER — Encounter: Payer: Self-pay | Admitting: Emergency Medicine

## 2024-02-14 VITALS — BP 110/64 | HR 86 | Temp 98.4°F | Ht 68.0 in | Wt 168.1 lb

## 2024-02-14 DIAGNOSIS — J449 Chronic obstructive pulmonary disease, unspecified: Secondary | ICD-10-CM

## 2024-02-14 DIAGNOSIS — Z130 Encounter for screening for diseases of the blood and blood-forming organs and certain disorders involving the immune mechanism: Secondary | ICD-10-CM

## 2024-02-14 DIAGNOSIS — E1159 Type 2 diabetes mellitus with other circulatory complications: Secondary | ICD-10-CM | POA: Diagnosis not present

## 2024-02-14 DIAGNOSIS — E1165 Type 2 diabetes mellitus with hyperglycemia: Secondary | ICD-10-CM | POA: Diagnosis not present

## 2024-02-14 DIAGNOSIS — E785 Hyperlipidemia, unspecified: Secondary | ICD-10-CM

## 2024-02-14 DIAGNOSIS — Z125 Encounter for screening for malignant neoplasm of prostate: Secondary | ICD-10-CM

## 2024-02-14 DIAGNOSIS — Z13228 Encounter for screening for other metabolic disorders: Secondary | ICD-10-CM

## 2024-02-14 DIAGNOSIS — I152 Hypertension secondary to endocrine disorders: Secondary | ICD-10-CM

## 2024-02-14 DIAGNOSIS — E1169 Type 2 diabetes mellitus with other specified complication: Secondary | ICD-10-CM

## 2024-02-14 DIAGNOSIS — I2510 Atherosclerotic heart disease of native coronary artery without angina pectoris: Secondary | ICD-10-CM

## 2024-02-14 DIAGNOSIS — Z0001 Encounter for general adult medical examination with abnormal findings: Secondary | ICD-10-CM | POA: Diagnosis not present

## 2024-02-14 LAB — LIPID PANEL
Cholesterol: 126 mg/dL (ref 0–200)
HDL: 43.7 mg/dL (ref >39.00)
LDL Cholesterol: 63 mg/dL (ref 0–99)
NonHDL: 81.85
Total CHOL/HDL Ratio: 3
Triglycerides: 92 mg/dL (ref 0.0–149.0)
VLDL: 18.4 mg/dL (ref 0.0–40.0)

## 2024-02-14 LAB — COMPREHENSIVE METABOLIC PANEL WITH GFR
ALT: 16 U/L (ref 0–53)
AST: 14 U/L (ref 0–37)
Albumin: 4.2 g/dL (ref 3.5–5.2)
Alkaline Phosphatase: 77 U/L (ref 39–117)
BUN: 19 mg/dL (ref 6–23)
CO2: 28 meq/L (ref 19–32)
Calcium: 9 mg/dL (ref 8.4–10.5)
Chloride: 104 meq/L (ref 96–112)
Creatinine, Ser: 0.76 mg/dL (ref 0.40–1.50)
GFR: 92.37 mL/min (ref >60.00)
Glucose, Bld: 66 mg/dL — ABNORMAL LOW (ref 70–99)
Potassium: 4.1 meq/L (ref 3.5–5.1)
Sodium: 142 meq/L (ref 135–145)
Total Bilirubin: 0.6 mg/dL (ref 0.2–1.2)
Total Protein: 6.7 g/dL (ref 6.0–8.3)

## 2024-02-14 LAB — CBC WITH DIFFERENTIAL/PLATELET
Basophils Absolute: 0 K/uL (ref 0.0–0.1)
Basophils Relative: 0.5 % (ref 0.0–3.0)
Eosinophils Absolute: 0.2 K/uL (ref 0.0–0.7)
Eosinophils Relative: 3.4 % (ref 0.0–5.0)
HCT: 48.2 % (ref 39.0–52.0)
Hemoglobin: 15.8 g/dL (ref 13.0–17.0)
Lymphocytes Relative: 17.2 % (ref 12.0–46.0)
Lymphs Abs: 1.2 K/uL (ref 0.7–4.0)
MCHC: 32.8 g/dL (ref 30.0–36.0)
MCV: 90.5 fl (ref 78.0–100.0)
Monocytes Absolute: 0.7 K/uL (ref 0.1–1.0)
Monocytes Relative: 10 % (ref 3.0–12.0)
Neutro Abs: 5 K/uL (ref 1.4–7.7)
Neutrophils Relative %: 68.9 % (ref 43.0–77.0)
Platelets: 241 K/uL (ref 150.0–400.0)
RBC: 5.32 Mil/uL (ref 4.22–5.81)
RDW: 15.5 % (ref 11.5–15.5)
WBC: 7.2 K/uL (ref 4.0–10.5)

## 2024-02-14 LAB — VITAMIN B12: Vitamin B-12: 221 pg/mL (ref 211–911)

## 2024-02-14 LAB — PSA: PSA: 1.27 ng/mL (ref 0.10–4.00)

## 2024-02-14 LAB — TSH: TSH: 0.76 u[IU]/mL (ref 0.35–5.50)

## 2024-02-14 LAB — VITAMIN D 25 HYDROXY (VIT D DEFICIENCY, FRACTURES): VITD: 18.64 ng/mL — ABNORMAL LOW (ref 30.00–100.00)

## 2024-02-14 MED ORDER — GLUCOSE BLOOD VI STRP: ORAL_STRIP | 12 refills | Status: AC

## 2024-02-14 MED ORDER — FREESTYLE LIBRE 14 DAY SENSOR MISC: 7 refills | Status: DC

## 2024-02-14 NOTE — Assessment & Plan Note (Signed)
 Well-controlled hypertension.  Continue amlodipine  5 mg daily Well-controlled diabetes with hemoglobin A1c of 7.2 Diet and nutrition discussed Cardiovascular risks associated with hypertension and diabetes discussed Continue weekly Trulicity  1.5 mg, daily Farxiga  10 mg, glipizide  10 mg before breakfast, Premeal insulin  aspart as per sliding scale, and daily Tresiba  30 units Glucose numbers at home have been elevated due to corticosteroid use.  Has been covering with short acting insulin  as per numbers. Recommend endocrinology evaluation. Referral placed today

## 2024-02-14 NOTE — Assessment & Plan Note (Signed)
 Chronic stable conditions Continue atorvastatin  20 mg daily Diet and nutrition discussed

## 2024-02-14 NOTE — Progress Notes (Signed)
 Duane Cuevas 68 y.o.   No chief complaint on file.   HISTORY OF PRESENT ILLNESS: This is a 68 y.o. male here for annual exam and follow-up on multiple chronic medical conditions Overall stable.  Has no complaints or any other medical concerns today.  Recent pulmonologist office visit assessment and plan as follows: VISIT SUMMARY:   Today, we discussed your chronic obstructive pulmonary disease (COPD) and other related health concerns. You have been managing your COPD with Breztri  and albuterol  inhalers, and you have noticed improvements since quitting smoking. We also addressed your sleep disturbances and the possibility of obstructive sleep apnea. Additionally, we reviewed your history of smoking and the need for lung cancer screening.   YOUR PLAN:   -CHRONIC OBSTRUCTIVE PULMONARY DISEASE (COPD): COPD is a chronic lung condition that makes it hard to breathe. You should continue using Breztri  twice daily and albuterol  as needed. We will also schedule pulmonary function tests to better understand your lung function and enroll you in a lung cancer screening program with annual CT scans.   -PULMONARY HYPERTENSION: Pulmonary hypertension is high blood pressure in the lungs' arteries, which can contribute to sleep disturbances. We need to ensure your cardiac evaluations are up to date.   -POSSIBLE OBSTRUCTIVE SLEEP APNEA: Obstructive sleep apnea is a condition where your breathing stops and starts during sleep, often due to airway blockage. We will order a home sleep apnea study to confirm this diagnosis.   -PERSONAL HISTORY OF NICOTINE  DEPENDENCE: You have a significant history of smoking but quit 4-5 months ago. Due to your smoking history, it is important to participate in lung cancer screening. We will enroll you in a program for annual CT scans.   INSTRUCTIONS:   Please continue using Breztri  twice daily and albuterol  as needed. We will schedule pulmonary function tests and a home  sleep apnea study. Ensure your cardiac evaluations are up to date. You will also be enrolled in a lung cancer screening program with annual CT scans.  HPI   Prior to Admission medications   Medication Sig Start Date End Date Taking? Authorizing Provider  albuterol  (PROVENTIL ) (2.5 MG/3ML) 0.083% nebulizer solution INHALE ONE VIAL VIA NEBULIZER EVERY 6 HOURS AS NEEDED FOR WHEEZING OR FOR SHORTNESS OF BREATH 02/13/24  Yes Purcell Emil Schanz, MD  albuterol  (VENTOLIN  HFA) 108 [phone] Base) MCG/ACT inhaler 2 inhalaciones Inhalaci n Cada 6 horas PRN, sibilancias, dificultad para respirar 12/16/23  Yes Rojelio Nest, DO  amLODipine  (NORVASC ) 5 MG tablet Take 1 tablet (5 mg total) by mouth daily. TOMAR 1 TABLETA POR VAI ORAL UNA VEZ AL DIA 01/11/24  Yes Celes Dedic, Emil Schanz, MD  aspirin  EC 81 MG tablet Take 1 tablet (81 mg total) by mouth daily. Swallow whole. 09/01/22  Yes Magnant, Carlin CROME, PA-C  atorvastatin  (LIPITOR) 20 MG tablet Take 1 tablet (20 mg total) by mouth daily. TOMAR 1 TABLETA POR VIA ORAL UNA VEZ AL DIA 10/26/23  Yes Jullianna Gabor, Emil Schanz, MD  Budeson-Glycopyrrol-Formoterol  (BREZTRI  AEROSPHERE) 160-9-4.8 MCG/ACT AERO INHALAR 2 BOCANADAS POR VIA ORAL DOS VECES AL DIA 06/16/23  Yes Kelce Bouton, Emil Schanz, MD  Continuous Glucose Sensor (FREESTYLE LIBRE 14 DAY SENSOR) MISC Continuous blood glucose monitoring as needed daily 02/14/24  Yes Berda Shelvin, Emil Schanz, MD  cyclobenzaprine  (FLEXERIL ) 10 MG tablet Take 1 tablet (10 mg total) by mouth 2 (two) times daily as needed for muscle spasms. 09/27/23  Yes Nivia Colon, PA-C  Dulaglutide  (TRULICITY ) 1.5 MG/0.5ML SOAJ INYECTAR CONTENIDOS DE UN LAPIZ POR VIA SUBCUTANEA  CADA [place], EL MISMO DIA CADA [facility] 07/18/23  Yes Purcell Emil Schanz, MD  FARXIGA  10 MG TABS tablet TOMAR 1 TABLETA POR VIA ORAL UNA VEZ AL DIA 10/24/23  Yes Christophe Rising, Emil Schanz, MD  gabapentin  (NEURONTIN ) 300 MG capsule Take 1 capsule (300 mg total) by mouth at bedtime as needed. 09/24/23   Yes Magnant, Charles L, PA-C  glipiZIDE  (GLUCOTROL ) 10 MG tablet TOMAR 1 TABLETA POR VIA ORAL UNA VEZ AL DIA ANTES DEL DESAYUNO *TOMAR 1 TABLETA ADICIONAL SI AZUCAR EN LA SANGRE ESTA ALTA* 10/24/23  Yes [place] Skalicky, Emil Schanz, MD  glucose blood test strip Use as instructed 02/14/24  Yes Shemeika Starzyk, Emil Schanz, MD  guaifenesin  (ROBITUSSIN) 100 MG/5ML syrup Take 200 mg by mouth 3 (three) times daily as needed for cough.   Yes [provider]  ibuprofen  (ADVIL ) 800 MG tablet Take 1 tablet (800 mg total) by mouth every 8 (eight) hours as needed. 08/25/23  Yes Magnant, Carlin CROME, PA-C  insulin  aspart (NOVOLOG  FLEXPEN) 100 UNIT/ML FlexPen INYECTAR 3 UNIDADES POR VIA SUBCUTANEA TRES VECES AL DIA COMO INDICADO. AJUSTAR CANTIDAD DE INSULINA POR ESCALA MOVIL. MAS DOSIS 30 UNIDADES 08/02/23  Yes Herman Mell Jose, MD  mupirocin  ointment (BACTROBAN ) 2 % Apply 1 Application topically daily. 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  Follow discharge instructions on paperwork for duration 08/09/23  Yes Magnant, Charles L, PA-C  pantoprazole  (PROTONIX ) 40 MG tablet Take 1 tablet (40 mg total) by mouth daily. TOMAR 1 TABLETA POR VIA ORAL UNA VEZ AL DIA 01/11/24  Yes Pleasant Britz, Emil Schanz, MD  predniSONE  (DELTASONE ) 10 MG tablet Take 4 tabs daily for 3 days, then 3 tabs daily for 3 days, then 2 tabs daily for 3 days, then 1 tab daily for 3 days, then 1/2 tab daily for 4 days. 12/16/23  Yes Rojelio Nest, DO  sildenafil  (VIAGRA ) 100 MG tablet Take 0.5-1 tablets (50-100 mg total) by mouth daily as needed for erectile dysfunction. 12/29/22  Yes Purcell Emil Schanz, MD  TRESIBA  FLEXTOUCH 100 UNIT/ML FlexTouch Pen INYECTAR 25 UNIDADES POR VIA SUBCUTANEA DIARIAMENTE 04/10/23  Yes Purcell Emil Schanz, MD    No Known Allergies  Patient Active Problem List   Diagnosis Date Noted   Hypocalcemia 12/13/2023   Essential hypertension 10/07/2023   Arthritis of right shoulder region 08/20/2023   S/P reverse  total shoulder arthroplasty, 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 GOLD III/ active smoker 08/14/2018   Hypokalemia 09/23/2015   COPD (chronic obstructive pulmonary disease) (HCC) 10/14/2014   Dyslipidemia associated with type 2 diabetes mellitus (HCC) 10/14/2014    Past Medical History:  Diagnosis Date   Cerebral aneurysm, nonruptured    followed by dr n. 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   Coronary artery disease    (pt admitted for chest pain ) nuclear stress test 11-02-2019  low risk, nuclear ef 58%,  prior inferior defect of severe severity indicative of previous MI;;   echo 04-28-2020 ef 60-65%, mild concentric LVH, RSVP 54.3mmHg;;   cardiac CTA  09-15-2020  unremarkable,  calcium  score=zero   Dependence on nocturnal oxygen  therapy    02-27-2023  per pt daughter,  pt only uses at night w/ O2 sat ,2-3L via [place],  only uses portable if going outside when every hot which is not much   Diverticulosis of colon    DOE (dyspnea on exertion)    02-27-2023  per pt daugter sob w/ stairs, incline's,  ok w/ some yard work if not warm/ hot wearther,  can do house hold chores   ED (erectile dysfunction)    Emphysema/COPD (HCC)    followed by pcp;   uses breztri  bid, uses preventil nebulizer every afternoon;   last exacerbation 06/ 2023 in epic   Full dentures    GERD (gastroesophageal reflux disease)    Hepatic steatosis    History of adenomatous polyp of  colon    History of gastric ulcer    per EGD 09-16-2020  non-bleeding   History of MI (myocardial infarction)    per nuclear stress test in epic 11-02-2019 showed prior inferior defect of severe severity indicative of previous MI   History of pelvic fracture    1999  and 2009   Hyperlipidemia    Hypertension    Smokers' cough (HCC)    Type 2 diabetes mellitus treated with insulin  (HCC)    followed by pcp;  dx 2016   (02-27-2023  per pt daughter pt   Wears glasses     Past Surgical History:  Procedure Laterality Date   BICEPT TENODESIS Right 08/08/2023   Procedure: BICEPS TENODESIS;  Surgeon: Addie Cordella Hamilton, MD;  Location[facility] OR;  Service: Orthopedics;  Laterality: Right;   BIOPSY  09/16/2020   Procedure: BIOPSY;  Surgeon: Eda Iha, MD;  Location: [facility];  Service: Gastroenterology;;   CHOLECYSTECTOMY N/A 03/20/2022   Procedure: LAPAROSCOPIC CHOLECYSTECTOMY;  Surgeon: Eletha Boas, MD;  Location: [facility];  Service: General;  Laterality: N/A;   COLONOSCOPY WITH PROPOFOL   05/10/2018   dr leigh   ESOPHAGOGASTRODUODENOSCOPY N/A 09/23/2015   Procedure: ESOPHAGOGASTRODUODENOSCOPY (EGD);  Surgeon: Gwendlyn ONEIDA Buddy, MD;  Location: [facility];  Service: Endoscopy;  Laterality: N/A;   ESOPHAGOGASTRODUODENOSCOPY (EGD) WITH PROPOFOL  N/A 09/16/2020   Procedure: ESOPHAGOGASTRODUODENOSCOPY (EGD) WITH PROPOFOL ;  Surgeon: Eda Iha, MD;  Location: [facility];  Service: Gastroenterology;  Laterality: N/A;   EXCISION MASS NECK Left 03/09/2023   Procedure: EXCISION SOFT TISSUE MASS LEFT POSTERIOR NECK;  Surgeon: Eletha Boas, MD;  Location: [facility];  Service: General;  Laterality: Left;   FOOT SURGERY Left 1999   INGUINAL HERNIA REPAIR Left 05/15/2018   Procedure: LEFT INGUINAL HERNIA REPAIR WITH MESH;  Surgeon: Vanderbilt Ned, MD;  Location: [facility] OR;  Service: General;  Laterality: Left;   INSERTION OF MESH Left 05/15/2018   Procedure: INSERTION OF MESH;  Surgeon: Vanderbilt Ned, MD;  Location: [facility] OR;  Service: General;  Laterality: Left;   INTRAOPERATIVE CHOLANGIOGRAM N/A 03/20/2022   Procedure: INTRAOPERATIVE CHOLANGIOGRAM;  Surgeon: Eletha Boas, MD;  Location: [facility];  Service: General;  Laterality: N/A;   REVERSE SHOULDER ARTHROPLASTY Left 08/30/2022   Procedure: LEFT REVERSE SHOULDER ARTHROPLASTY;  Surgeon: Addie Cordella Hamilton, MD;  Location: [facility] OR;  Service: Orthopedics;  Laterality: Left;   REVERSE SHOULDER ARTHROPLASTY Right 08/08/2023   Procedure: RIGHT REVERSE SHOULDER ARTHROPLASTY;  Surgeon: Addie Cordella Hamilton, MD;  Location: [facility] OR;  Service: Orthopedics;  Laterality: Right;   SHOULDER ARTHROSCOPY W/ ROTATOR CUFF REPAIR Left    yrs ago  Social History   Socioeconomic History   Marital status: Married    Spouse name: Zoraida   Number of children: 5   Years of education: Not on file   Highest education level: 12th grade  Occupational History   Occupation: Employed    Associate Professor: [employer]  Tobacco Use   Smoking status: Former    Current packs/day: 0.00    Average packs/day: 0.2 packs/day for 54.0 years (8.1 ttl pk-yrs)    Types: Cigarettes    Start date: 4    Quit date: 07/20/2023    Years since quitting: 0.5   Smokeless tobacco: Never   Tobacco comments:    02-27-2023  per pt daughter pt is still smoking, but hides how much per day,  smoking since age 65 (76)  Vaping Use   Vaping status: Never Used  Substance and Sexual Activity   Alcohol use: Not Currently    Alcohol/week: 2.0 standard drinks of alcohol    Types: 2 Cans of beer per week    Comment: 2 beers per week   Drug use: Never   Sexual activity: Yes  Other Topics Concern   Not on file  Social History Narrative   Lives with wife. 2025 Employed full time as a Designer, fashion/clothing.  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   Social Drivers of Corporate investment banker Strain: High Risk (01/11/2024)   Overall Financial Resource Strain (CARDIA)    Difficulty of Paying Living Expenses: Very hard   Food Insecurity: Food Insecurity Present (01/11/2024)   Hunger Vital Sign    Worried About Running Out of Food in the Last Year: Sometimes true    Ran Out of Food in the Last Year: Sometimes true  Transportation Needs: No Transportation Needs (01/11/2024)   PRAPARE - Administrator, Civil Service (Medical): No    Lack of Transportation (Non-Medical): No  Physical Activity: Sufficiently Active (01/11/2024)   Exercise Vital Sign    Days of Exercise per Week: 7 days    Minutes of Exercise per Session: 30 min  Stress: No Stress Concern Present (01/11/2024)   [employer] of Occupational Health - Occupational Stress Questionnaire    Feeling of Stress: Not at all  Social Connections: Moderately Isolated (01/11/2024)   Social Connection and Isolation Panel    Frequency of Communication with Friends and Family: More than three times a week    Frequency of Social Gatherings with Friends and Family: More than three times a week    Attends Religious Services: Never    Database administrator or Organizations: No    Attends Banker Meetings: Never    Marital Status: Married  Catering manager Violence: Not At Risk (01/11/2024)   Humiliation, Afraid, Rape, and Kick questionnaire    Fear of Current or Ex-Partner: No    Emotionally Abused: No    Physically Abused: No    Sexually Abused: No    Family History  Problem Relation Age of Onset   Emphysema Mother        passed away on 08-04-2023   Diabetes Mother    Emphysema Father    Cancer Sister        Stomach cancer; pt says that pt had 12 sisters but the one w/ stomach cancer passed away   Diabetes Sister    Stomach cancer Sister    Diabetes Brother    Colon cancer Neg Hx    Colon polyps Neg Hx    Esophageal cancer Neg Hx  Rectal cancer Neg Hx      Review of Systems  Constitutional: Negative.  Negative for chills and fever.  HENT: Negative.  Negative for congestion and sore throat.   Respiratory: Negative.   Negative for cough and shortness of breath.   Cardiovascular: Negative.  Negative for chest pain and palpitations.  Gastrointestinal:  Negative for abdominal pain, diarrhea, nausea and vomiting.  Genitourinary: Negative.  Negative for dysuria and hematuria.  Skin: Negative.  Negative for rash.  Neurological: Negative.  Negative for dizziness and headaches.  All other systems reviewed and are negative.   Vitals:   02/14/24 0810  BP: 110/64  Pulse: 86  Temp: 98.4 F (36.9 C)  SpO2: 94%    Physical Exam Vitals reviewed.  Constitutional:      Appearance: Normal appearance.  HENT:     Head: Normocephalic.     Right Ear: Tympanic membrane, ear canal and external ear normal.     Left Ear: Tympanic membrane, ear canal and external ear normal.     Mouth/Throat:     Mouth: Mucous membranes are moist.     Pharynx: Oropharynx is clear.  Eyes:     Extraocular Movements: Extraocular movements intact.     Conjunctiva/sclera: Conjunctivae normal.     Pupils: Pupils are equal, round, and reactive to light.  Cardiovascular:     Rate and Rhythm: Normal rate and regular rhythm.     Pulses: Normal pulses.     Heart sounds: Normal heart sounds.  Pulmonary:     Effort: Pulmonary effort is normal.     Breath sounds: Normal breath sounds.  Abdominal:     Palpations: Abdomen is soft.     Tenderness: There is no abdominal tenderness.  Musculoskeletal:     Cervical back: No tenderness.     Right lower leg: No edema.     Left lower leg: No edema.  Lymphadenopathy:     Cervical: No cervical adenopathy.  Skin:    General: Skin is warm and dry.     Capillary Refill: Capillary refill takes less than 2 seconds.  Neurological:     General: No focal deficit present.     Mental Status: He is alert and oriented to person, place, and time.  Psychiatric:        Mood and Affect: Mood normal.        Behavior: Behavior normal.      ASSESSMENT & PLAN: Problem List Items Addressed This Visit        Cardiovascular and Mediastinum   CAD (coronary artery disease)   Stable without recent anginal episodes Continue daily baby aspirin  and atorvastatin  20 mg daily      Hypertension associated with diabetes (HCC)   Well-controlled hypertension.  Continue amlodipine  5 mg daily Well-controlled diabetes with hemoglobin A1c of 7.2 Diet and nutrition discussed Cardiovascular risks associated with hypertension and diabetes discussed Continue weekly Trulicity  1.5 mg, daily Farxiga  10 mg, glipizide  10 mg before breakfast, Premeal insulin  aspart as per sliding scale, and daily Tresiba  30 units Glucose numbers at home have been elevated due to corticosteroid use.  Has been covering with short acting insulin  as per numbers. Recommend endocrinology evaluation. Referral placed today      Relevant Orders   Comprehensive metabolic panel with GFR (Completed)     Respiratory   COPD (chronic obstructive pulmonary disease) (HCC)   Clinically stable. Recently evaluated by pulmonary Dr. Schedule for pulmonary function testing next month Continues Breztri  2 inhalations twice a day  and albuterol  inhaler as needed      Relevant Orders   CBC with Differential/Platelet (Completed)     Endocrine   Dyslipidemia associated with type 2 diabetes mellitus (HCC) (Chronic)   Chronic stable conditions Continue atorvastatin  20 mg daily Diet and nutrition discussed      Relevant Orders   Comprehensive metabolic panel with GFR (Completed)   Lipid panel (Completed)   Uncontrolled type 2 diabetes mellitus with hyperglycemia (HCC)   Relevant Medications   glucose blood test strip   Continuous Glucose Sensor (FREESTYLE LIBRE 14 DAY SENSOR) MISC   Other Visit Diagnoses       Encounter for general adult medical examination with abnormal findings    -  Primary   Relevant Medications   glucose blood test strip   Other Relevant Orders   CBC with Differential/Platelet (Completed)   Comprehensive metabolic panel  with GFR (Completed)   Lipid panel (Completed)   Vitamin B12 (Completed)   TSH (Completed)   VITAMIN D  25 Hydroxy (Vit-D Deficiency, Fractures) (Completed)   PSA (Completed)     Screening for deficiency anemia       Relevant Orders   CBC with Differential/Platelet (Completed)     Screening for endocrine, metabolic and immunity disorder       Relevant Orders   Comprehensive metabolic panel with GFR (Completed)   Vitamin B12 (Completed)   TSH (Completed)   VITAMIN D  25 Hydroxy (Vit-D Deficiency, Fractures) (Completed)     Screening for prostate cancer       Relevant Orders   PSA (Completed)      Modifiable risk factors discussed with patient. Anticipatory guidance according to age provided. The following topics were also discussed: Social Determinants of Health Smoking.  Former smoker Diet and nutrition Benefits of exercise Cancer screening and review of colonoscopy report from 2019 Vaccinations review and recommendations Cardiovascular risk assessment Review of multiple chronic medical conditions under management Review of all medications Mental health including depression and anxiety Fall and accident prevention  Patient Instructions  Mantenimiento de Radiographer, therapeutic en los hombres Health Maintenance, Male Adoptar un estilo de vida saludable y recibir atencin preventiva son importantes para promover la salud y Counsellor. Consulte al mdico sobre: El esquema adecuado para hacerse pruebas y exmenes peridicos. Cosas que puede hacer por su cuenta para prevenir enfermedades y [place] sano. Qu debo saber sobre la dieta, el peso y el ejercicio? Consuma una dieta saludable  Consuma una dieta que incluya muchas verduras, frutas, productos lcteos con bajo contenido de grasa y protenas magras. No consuma muchos alimentos ricos en grasas slidas, azcares agregados o sodio. Mantenga un peso saludable El ndice de masa muscular [facility]) es una medida que puede utilizarse para  identificar posibles problemas de [place]. Proporciona una estimacin de la grasa corporal basndose en el peso y la altura. Su mdico puede ayudarle a determinar su IMC y a Personnel officer o Pharmacologist un peso saludable. Haga ejercicio con regularidad Haga ejercicio con regularidad. Esta es una de las prcticas ms importantes que puede hacer por su salud. La [employer] de los adultos deben seguir estas pautas: Education officer, environmental, al menos, 150 minutos de actividad fsica por semana. El ejercicio debe aumentar la frecuencia cardaca y Media planner transpirar (ejercicio de intensidad moderada). Hacer ejercicios de fortalecimiento por lo [facility] por semana. Agregue esto a su plan de ejercicio de intensidad moderada. Pase menos tiempo sentado. Incluso la actividad fsica ligera puede ser beneficiosa. Controle sus niveles de colesterol y lpidos en  la sangre Comience a realizarse anlisis de lpidos y Oncologist en la sangre a los 20 aos y luego reptalos cada 5 aos. Es posible que Insurance underwriter los niveles de colesterol con mayor frecuencia si: Sus niveles de lpidos y colesterol son altos. Es mayor de 40 aos. Presenta un alto riesgo de padecer enfermedades cardacas. Qu debo saber sobre las pruebas de deteccin del cncer? Muchos tipos de cncer pueden detectarse de manera temprana y, a menudo, pueden prevenirse. Segn su historia clnica y sus antecedentes familiares, es posible que deba realizarse pruebas de deteccin del cncer en diferentes edades. Esto puede incluir pruebas de deteccin de lo siguiente: Building services engineer. Cncer de prstata. Cncer de piel. Cncer de pulmn. Qu debo saber sobre la enfermedad cardaca, la diabetes y la hipertensin arterial? Presin arterial y enfermedad cardaca La hipertensin arterial causa enfermedades cardacas y lesotho el riesgo de accidente cerebrovascular. Es ms probable que esto se manifieste en las personas que tienen lecturas de presin arterial alta o  tienen sobrepeso. Hable con el mdico sobre sus valores de presin arterial deseados. Hgase controlar la presin arterial: Cada 3 a 5 aos si tiene entre 18 y 40 aos. Todos los aos si es mayor de 40 aos. Si tiene entre 65 y 22 aos y es fumador o Insurance underwriter, pregntele al mdico si debe realizarse una prueba de deteccin de aneurisma artico abdominal (AAA) por nica vez. Diabetes Realcese exmenes de deteccin de la diabetes con regularidad. Este anlisis revisa el nivel de azcar en la sangre en [place]. Hgase las pruebas de deteccin: Cada tres aos despus de los 45 aos de edad si tiene un peso normal y un bajo riesgo de padecer diabetes. Con ms frecuencia y a partir de [place] edad inferior si tiene sobrepeso o un alto riesgo de padecer diabetes. Qu debo saber sobre la prevencin de infecciones? Hepatitis B Si tiene un riesgo ms alto de contraer hepatitis B, debe someterse a un examen de deteccin de este virus. Hable con el mdico para averiguar si tiene riesgo de contraer la infeccin por hepatitis B. Hepatitis C Se recomienda un anlisis de [place] para: Todos los que nacieron entre 1945 y [phone]. Todas las personas que tengan un riesgo de haber contrado hepatitis C. Enfermedades de transmisin sexual (ETS) Debe realizarse pruebas de deteccin de ITS todos los aos, incluidas la gonorrea y la clamidia, si: Es sexualmente activo y es menor de [address]. Es mayor de [address], y Public affairs consultant informa que corre riesgo de tener este tipo de infecciones. La actividad sexual ha cambiado desde que le hicieron la ltima prueba de deteccin y tiene un riesgo mayor de tener clamidia o Copy. Pregntele al mdico si usted tiene riesgo. Pregntele al mdico si usted tiene un alto riesgo de Primary school teacher VIH. El mdico tambin puede recomendarle un medicamento recetado para ayudar a evitar la infeccin por el VIH. Si elige tomar medicamentos para prevenir el VIH, primero debe [employer] de  deteccin del VIH. Luego debe hacerse anlisis cada 3 meses mientras est tomando los medicamentos. Siga estas indicaciones en su casa: Consumo de alcohol No beba alcohol si el mdico se lo prohbe. Si bebe alcohol: Limite la cantidad que consume de 0 a 2 bebidas por da. Sepa cunta cantidad de alcohol hay en las bebidas que toma. En los [address], una medida equivale a una botella de cerveza de 12 oz (355 ml), un vaso de vino de 5 oz (148 ml) o un vaso de  una bebida alcohlica de alta graduacin de 1 oz (44 ml). Estilo de vida No consuma ningn producto que contenga nicotina o tabaco. Estos productos incluyen cigarrillos, tabaco para Theatre manager y aparatos de vapeo, como los cigarrillos electrnicos. Si necesita ayuda para dejar de consumir estos productos, consulte al mdico. No consuma drogas. No comparta agujas. Solicite ayuda a su mdico si necesita apoyo o informacin para abandonar las drogas. Indicaciones generales Realcese los estudios de rutina de [address], dentales y de Wellsite geologist. Mantngase al da con las vacunas. Infrmele a su mdico si: Se siente deprimido con frecuencia. Alguna vez ha sido vctima de maltrato o no se siente seguro en su casa. Resumen Adoptar un estilo de vida saludable y recibir atencin preventiva son importantes para promover la salud y Counsellor. Siga las instrucciones del mdico acerca de una dieta saludable, el ejercicio y la realizacin de pruebas o exmenes para Hotel manager. Siga las instrucciones del mdico con respecto al control del colesterol y la presin arterial. Esta informacin no tiene Theme park manager el consejo del mdico. Asegrese de hacerle al mdico cualquier pregunta que tenga. Document Revised: 11/25/2020 Document Reviewed: 11/25/2020 Elsevier Patient Education  2024 Elsevier Inc.     Emil Schaumann, MD [facility]

## 2024-02-14 NOTE — Assessment & Plan Note (Signed)
 Clinically stable. Recently evaluated by pulmonary Dr. Schedule for pulmonary function testing next month Continues Breztri  2 inhalations twice a day and albuterol  inhaler as needed

## 2024-02-14 NOTE — Assessment & Plan Note (Signed)
 Stable without recent anginal episodes Continue daily baby aspirin and atorvastatin 20 mg daily

## 2024-02-14 NOTE — Patient Instructions (Signed)
 Mantenimiento de Research officer, political party, Male Adoptar un estilo de vida saludable y recibir atencin preventiva son importantes para promover la salud y Counsellor. Consulte al mdico sobre: El esquema adecuado para hacerse pruebas y exmenes peridicos. Cosas que puede hacer por su cuenta para prevenir enfermedades y [place] sano. Qu debo saber sobre la dieta, el peso y el ejercicio? Consuma una dieta saludable  Consuma una dieta que incluya muchas verduras, frutas, productos lcteos con bajo contenido de [place] y Associate Professor. No consuma muchos alimentos ricos en grasas slidas, azcares agregados o sodio. Mantenga un peso saludable El ndice de masa muscular [facility]) es una medida que puede utilizarse para identificar posibles problemas de [place]. Proporciona una estimacin de la grasa corporal basndose en el peso y la altura. Su mdico puede ayudarle a Engineer, site [facility] y a Personnel officer o Pharmacologist un peso saludable. Haga ejercicio con regularidad Haga ejercicio con regularidad. Esta es una de las prcticas ms importantes que puede hacer por su salud. La [employer] de los adultos deben seguir estas pautas: Education officer, environmental, al menos, 150 minutos de actividad fsica por semana. El ejercicio debe aumentar la frecuencia cardaca y Media planner transpirar (ejercicio de intensidad moderada). Hacer ejercicios de fortalecimiento por lo [employer] por semana. Agregue esto a su plan de ejercicio de intensidad moderada. Pase menos tiempo sentado. Incluso la actividad fsica ligera puede ser beneficiosa. Controle sus niveles de colesterol y lpidos en la sangre Comience a realizarse anlisis de lpidos y Oncologist en la sangre a los 20 aos y luego reptalos cada 5 aos. Es posible que Insurance underwriter los niveles de colesterol con mayor frecuencia si: Sus niveles de lpidos y colesterol son altos. Es mayor de 40 aos. Presenta un alto riesgo de padecer enfermedades cardacas. Qu debo  saber sobre las pruebas de deteccin del cncer? Muchos tipos de cncer pueden detectarse de manera temprana y, a menudo, pueden prevenirse. Segn su historia clnica y sus antecedentes familiares, es posible que deba realizarse pruebas de deteccin del cncer en diferentes edades. Esto puede incluir pruebas de deteccin de lo siguiente: Building services engineer. Cncer de prstata. Cncer de piel. Cncer de pulmn. Qu debo saber sobre la enfermedad cardaca, la diabetes y la hipertensin arterial? Presin arterial y enfermedad cardaca La hipertensin arterial causa enfermedades cardacas y [place] el riesgo de accidente cerebrovascular. Es ms probable que esto se manifieste en las personas que tienen lecturas de presin arterial alta o tienen sobrepeso. Hable con el mdico sobre sus valores de presin arterial deseados. Hgase controlar la presin arterial: Cada 3 a 5 aos si tiene entre 18 y 71 aos. Todos los aos si es mayor de 40 aos. Si tiene entre 65 y 26 aos y es fumador o Insurance underwriter, pregntele al mdico si debe realizarse una prueba de deteccin de aneurisma artico abdominal (AAA) por nica vez. Diabetes Realcese exmenes de deteccin de la diabetes con regularidad. Este anlisis revisa el nivel de azcar en la sangre en [place]. Hgase las pruebas de deteccin: Cada tres aos despus de los 45 aos de edad si tiene un peso normal y un bajo riesgo de padecer diabetes. Con ms frecuencia y a partir de [place] edad inferior si tiene sobrepeso o un alto riesgo de padecer diabetes. Qu debo saber sobre la prevencin de infecciones? Hepatitis B Si tiene un riesgo ms alto de contraer hepatitis B, debe someterse a un examen de deteccin de este virus. Hable con el mdico para averiguar si tiene riesgo de  contraer la infeccin por hepatitis B. Hepatitis C Se recomienda un anlisis de Benjamin Perez para: Todos los que nacieron entre 1945 y [phone]. Todas las personas que tengan un riesgo de haber  contrado hepatitis C. Enfermedades de transmisin sexual (ETS) Debe realizarse pruebas de deteccin de ITS todos los aos, incluidas la gonorrea y la clamidia, si: Es sexualmente activo y es menor de [address]. Es mayor de [address], y Public affairs consultant informa que corre riesgo de tener este tipo de infecciones. La actividad sexual ha cambiado desde que le hicieron la ltima prueba de deteccin y tiene un riesgo mayor de Warehouse manager clamidia o Copy. Pregntele al mdico si usted tiene riesgo. Pregntele al mdico si usted tiene un alto riesgo de Primary school teacher VIH. El mdico tambin puede recomendarle un medicamento recetado para ayudar a evitar la infeccin por el VIH. Si elige tomar medicamentos para prevenir el VIH, primero debe [employer] de deteccin del VIH. Luego debe hacerse anlisis cada 3 meses mientras est tomando los medicamentos. Siga estas indicaciones en su casa: Consumo de alcohol No beba alcohol si el mdico se lo prohbe. Si bebe alcohol: Limite la cantidad que consume de 0 a 2 bebidas por da. Sepa cunta cantidad de alcohol hay en las bebidas que toma. En los [address], una medida equivale a una botella de cerveza de 12 oz (355 ml), un vaso de vino de 5 oz (148 ml) o un vaso de una bebida alcohlica de alta graduacin de 1 oz (44 ml). Estilo de vida No consuma ningn producto que contenga nicotina o tabaco. Estos productos incluyen cigarrillos, tabaco para Theatre manager y aparatos de vapeo, como los Administrator, Civil Service. Si necesita ayuda para dejar de consumir estos productos, consulte al mdico. No consuma drogas. No comparta agujas. Solicite ayuda a su mdico si necesita apoyo o informacin para abandonar las drogas. Indicaciones generales Realcese los estudios de rutina de [address], dentales y de Wellsite geologist. Mantngase al da con las vacunas. Infrmele a su mdico si: Se siente deprimido con frecuencia. Alguna vez ha sido vctima de [place] o no se siente seguro en su  casa. Resumen Adoptar un estilo de vida saludable y recibir atencin preventiva son importantes para promover la salud y Counsellor. Siga las instrucciones del mdico acerca de una dieta saludable, el ejercicio y la realizacin de pruebas o exmenes para Hotel manager. Siga las instrucciones del mdico con respecto al control del colesterol y la presin arterial. Esta informacin no tiene Theme park manager el consejo del mdico. Asegrese de hacerle al mdico cualquier pregunta que tenga. Document Revised: 11/25/2020 Document Reviewed: 11/25/2020 Elsevier Patient Education  2024 [employer].

## 2024-02-21 NOTE — Telephone Encounter (Unsigned)
 Copied from CRM #[id number]. Topic: Clinical - Medication Prior Auth >> Feb 21, 2024 11:31 AM Dedra B wrote: Reason for CRM: [place] from [facility] called regarding pt PA for the freestyle libre glucose monitor. She said the charts have mismatch information regarding the pt name and she needs clarification. Pls call her at [phone].

## 2024-02-26 ENCOUNTER — Telehealth: Payer: Self-pay | Admitting: Acute Care

## 2024-02-26 DIAGNOSIS — Z87891 Personal history of nicotine dependence: Secondary | ICD-10-CM

## 2024-02-26 DIAGNOSIS — F17210 Nicotine dependence, cigarettes, uncomplicated: Secondary | ICD-10-CM

## 2024-02-26 DIAGNOSIS — Z122 Encounter for screening for malignant neoplasm of respiratory organs: Secondary | ICD-10-CM

## 2024-02-26 NOTE — Telephone Encounter (Signed)
 Lung Cancer Screening Narrative/Criteria Questionnaire (Cigarette Smokers Only- No Cigars/Pipes/vapes)   Duane Cuevas   SDMV:03/01/2024 12:00p Duane Cuevas       Mar 05, 1956   LDCT: 03/05/2024 10:20a GI    68 y.o.   Phone: [phone], daughter's number Duane Cuevas, daughter will interpret call for pt  Lung Screening Narrative (confirm age 47-77 yrs Medicare / 50-80 yrs Private pay insurance)   Insurance information:Aetna mcr and mcd   Referring Provider:Dr. Tamea   This screening involves an initial phone call with a team member from our program. It is called a shared decision making visit. The initial meeting is required by  insurance and Medicare to make sure you understand the program. This appointment takes about 15-20 minutes to complete. You will complete the screening scan at your scheduled date/time.  This scan takes about 5-10 minutes to complete. You can eat and drink normally before and after the scan.  Criteria questions for Lung Cancer Screening:   Are you a current or former smoker? Current Age began smoking: 68yo   If you are a former smoker, what year did you quit smoking? N/A(within 15 yrs)   To calculate your smoking history, I need an accurate estimate of how many packs of cigarettes you smoked per day and for how many years. (Not just the number of PPD you are now smoking)   Years smoking 56 x Packs per day 1 = Pack years 56   (at least 20 pack yrs)   (Make sure they understand that we need to know how much they have smoked in the past, not just the number of PPD they are smoking now)  Do you have a personal history of cancer?  No    Do you have a family history of cancer? Yes  (cancer type and and relative) sister - breast, sister -breast, sister - stomach   Are you coughing up blood?  No  Have you had unexplained weight loss of 15 lbs or more in the last 6 months? No  It looks like you meet all criteria.  When would be a good time for us  to schedule you for  this screening?   Additional information: N/A

## 2024-02-29 ENCOUNTER — Encounter: Payer: Self-pay | Admitting: Emergency Medicine

## 2024-03-01 ENCOUNTER — Encounter: Payer: Self-pay | Admitting: *Deleted

## 2024-03-01 ENCOUNTER — Ambulatory Visit (INDEPENDENT_AMBULATORY_CARE_PROVIDER_SITE_OTHER): Admitting: *Deleted

## 2024-03-01 DIAGNOSIS — F17210 Nicotine dependence, cigarettes, uncomplicated: Secondary | ICD-10-CM | POA: Diagnosis not present

## 2024-03-01 NOTE — Telephone Encounter (Signed)
 LVM for [facility] to call back to verify mismatch information

## 2024-03-01 NOTE — Progress Notes (Signed)
 Virtual Visit via Telephone Note  I connected with Duane Cuevas on 03/01/24 at 12:00 PM EDT by telephone and verified that I am speaking with the correct person using two identifiers.  Location: Patient: Duane Cuevas Provider: Laneta Speaks, RN   I discussed the limitations, risks, security and privacy concerns of performing an evaluation and management service by telephone and the availability of in person appointments. I also discussed with the patient that there may be a patient responsible charge related to this service. The patient expressed understanding and agreed to proceed.   Shared Decision Making Visit Lung Cancer Screening Program [phone])   Eligibility: Age 68 y.o. Pack Years Smoking History Calculation 54 (# packs/per year x # years smoked) Recent History of coughing up blood  no Unexplained weight loss? no ( >Than 15 pounds within the last 6 months ) Prior History Lung / other cancer no (Diagnosis within the last 5 years already requiring surveillance chest CT Scans). Smoking Status Current Smoker Former Smokers: Years since quit: n/a  Quit Date: n/a  Visit Components: Discussion included one or more decision making aids. yes Discussion included risk/benefits of screening. yes Discussion included potential follow up diagnostic testing for abnormal scans. yes Discussion included meaning and risk of over diagnosis. yes Discussion included meaning and risk of False Positives. yes Discussion included meaning of total radiation exposure. yes  Counseling Included: Importance of adherence to annual lung cancer LDCT screening. yes Impact of comorbidities on ability to participate in the program. yes Ability and willingness to under diagnostic treatment. yes  Smoking Cessation Counseling: Current Smokers:  Discussed importance of smoking cessation. yes Information about tobacco cessation classes and interventions provided to patient. yes Patient provided  with ticket for LDCT Scan. no Symptomatic Patient. no  Counseling(Intermediate counseling: > three minutes) 99406 Diagnosis Code: Tobacco Use Z72.0 Asymptomatic Patient yes  Counseling (Intermediate counseling: > three minutes counseling) [id number]  Counseled patient 3-4 minutes regarding tobacco use.   Former Smokers:  Discussed the importance of maintaining cigarette abstinence. yes Diagnosis Code: Personal History of Nicotine  Dependence. S12.108 Information about tobacco cessation classes and interventions provided to patient. Yes Patient provided with ticket for LDCT Scan. no Written Order for Lung Cancer Screening with LDCT placed in Epic. Yes (CT Chest Lung Cancer Screening Low Dose W/O CM) [id number] Z12.2-Screening of respiratory organs Z87.891-Personal history of nicotine  dependence   Laneta Speaks, RN

## 2024-03-01 NOTE — Patient Instructions (Signed)

## 2024-03-03 ENCOUNTER — Other Ambulatory Visit: Payer: Self-pay | Admitting: Emergency Medicine

## 2024-03-05 ENCOUNTER — Ambulatory Visit
Admission: RE | Admit: 2024-03-05 | Discharge: 2024-03-05 | Disposition: A | Discharge status: Home / Self Care | Source: Ambulatory Visit | Attending: Acute Care | Admitting: Acute Care

## 2024-03-05 ENCOUNTER — Encounter: Payer: Self-pay | Admitting: Sports Medicine

## 2024-03-05 DIAGNOSIS — F17210 Nicotine dependence, cigarettes, uncomplicated: Secondary | ICD-10-CM | POA: Diagnosis not present

## 2024-03-05 DIAGNOSIS — Z87891 Personal history of nicotine dependence: Secondary | ICD-10-CM

## 2024-03-05 DIAGNOSIS — Z122 Encounter for screening for malignant neoplasm of respiratory organs: Secondary | ICD-10-CM

## 2024-03-08 ENCOUNTER — Other Ambulatory Visit: Payer: Self-pay | Admitting: Emergency Medicine

## 2024-03-08 DIAGNOSIS — J439 Emphysema, unspecified: Secondary | ICD-10-CM

## 2024-03-13 ENCOUNTER — Other Ambulatory Visit: Payer: Self-pay

## 2024-03-13 DIAGNOSIS — Z122 Encounter for screening for malignant neoplasm of respiratory organs: Secondary | ICD-10-CM

## 2024-03-13 DIAGNOSIS — Z87891 Personal history of nicotine dependence: Secondary | ICD-10-CM

## 2024-03-13 DIAGNOSIS — F17210 Nicotine dependence, cigarettes, uncomplicated: Secondary | ICD-10-CM

## 2024-03-16 DIAGNOSIS — J441 Chronic obstructive pulmonary disease with (acute) exacerbation: Secondary | ICD-10-CM | POA: Diagnosis not present

## 2024-03-20 ENCOUNTER — Other Ambulatory Visit: Payer: Self-pay | Admitting: Emergency Medicine

## 2024-03-20 NOTE — Progress Notes (Signed)
 Virtual Visit via Telephone Note   I connected with Duane Cuevas on 03/01/24 at 12:00 PM EDT by telephone and verified that I am speaking with the correct person using two identifiers.   Location: Patient: at home Provider: [address] [place], [place], [address]    I discussed the limitations, risks, security and privacy concerns of performing an evaluation and management service by telephone and the availability of in person appointments. I also discussed with the patient that there may be a patient responsible charge related to this service. The patient expressed understanding and agreed to proceed.     Shared Decision Making Visit Lung Cancer Screening Program [phone])     Eligibility: Age 68 y.o. Pack Years Smoking History Calculation 54 (# packs/per year x # years smoked) Recent History of coughing up blood  no Unexplained weight loss? no ( >Than 15 pounds within the last 6 months ) Prior History Lung / other cancer no (Diagnosis within the last 5 years already requiring surveillance chest CT Scans). Smoking Status Current Smoker Former Smokers: Years since quit: n/a       Quit Date: n/a   Visit Components: Discussion included one or more decision making aids. yes Discussion included risk/benefits of screening. yes Discussion included potential follow up diagnostic testing for abnormal scans. yes Discussion included meaning and risk of over diagnosis. yes Discussion included meaning and risk of False Positives. yes Discussion included meaning of total radiation exposure. yes   Counseling Included: Importance of adherence to annual lung cancer LDCT screening. yes Impact of comorbidities on ability to participate in the program. yes Ability and willingness to under diagnostic treatment. yes   Smoking Cessation Counseling: Current Smokers:  Discussed importance of smoking cessation. yes Information about tobacco cessation classes and interventions provided  to patient. yes Patient provided with ticket for LDCT Scan. no Symptomatic Patient. no             Counseling(Intermediate counseling: > three minutes) 99406 Diagnosis Code: Tobacco Use Z72.0 Asymptomatic Patient yes       Counseling (Intermediate counseling: > three minutes counseling) H9563   Counseled patient 3 minutes regarding tobacco use.    Former Smokers:  Discussed the importance of maintaining cigarette abstinence. yes Diagnosis Code: Personal History of Nicotine  Dependence. S12.108 Information about tobacco cessation classes and interventions provided to patient. Yes Patient provided with ticket for LDCT Scan. no Written Order for Lung Cancer Screening with LDCT placed in Epic. Yes (CT Chest Lung Cancer Screening Low Dose W/O CM) [id number] Z12.2-Screening of respiratory organs Z87.891-Personal history of nicotine  dependence     Laneta Speaks, RN

## 2024-03-22 ENCOUNTER — Ambulatory Visit (INDEPENDENT_AMBULATORY_CARE_PROVIDER_SITE_OTHER): Admitting: Pulmonary Disease

## 2024-03-22 ENCOUNTER — Encounter: Payer: Self-pay | Admitting: Pulmonary Disease

## 2024-03-22 ENCOUNTER — Ambulatory Visit: Admitting: Pulmonary Disease

## 2024-03-22 VITALS — BP 126/72 | HR 87 | Temp 97.7°F | Ht 68.0 in | Wt 166.2 lb

## 2024-03-22 DIAGNOSIS — J449 Chronic obstructive pulmonary disease, unspecified: Secondary | ICD-10-CM

## 2024-03-22 DIAGNOSIS — R0602 Shortness of breath: Secondary | ICD-10-CM

## 2024-03-22 DIAGNOSIS — Z87891 Personal history of nicotine dependence: Secondary | ICD-10-CM

## 2024-03-22 DIAGNOSIS — Z9189 Other specified personal risk factors, not elsewhere classified: Secondary | ICD-10-CM

## 2024-03-22 DIAGNOSIS — I272 Pulmonary hypertension, unspecified: Secondary | ICD-10-CM

## 2024-03-22 LAB — PULMONARY FUNCTION TEST
DL/VA % pred: 65 %
DL/VA: 2.65 ml/min/mmHg/L
DLCO unc % pred: 59 %
DLCO unc: 14.65 ml/min/mmHg
FEF 25-75 Post: 0.3 L/s
FEF 25-75 Pre: 0.45 L/s
FEF2575-%Change-Post: -32 %
FEF2575-%Pred-Post: 12 %
FEF2575-%Pred-Pre: 18 %
FEV1-%Change-Post: -7 %
FEV1-%Pred-Post: 26 %
FEV1-%Pred-Pre: 28 %
FEV1-Post: 0.83 L
FEV1-Pre: 0.89 L
FEV1FVC-%Change-Post: -6 %
FEV1FVC-%Pred-Pre: 53 %
FEV6-%Change-Post: -8 %
FEV6-%Pred-Post: 51 %
FEV6-%Pred-Pre: 56 %
FEV6-Post: 2.01 L
FEV6-Pre: 2.2 L
FEV6FVC-%Change-Post: -8 %
FEV6FVC-%Pred-Post: 94 %
FEV6FVC-%Pred-Pre: 103 %
FVC-%Change-Post: 0 %
FVC-%Pred-Post: 53 %
FVC-%Pred-Pre: 54 %
FVC-Post: 2.24 L
FVC-Pre: 2.25 L
Post FEV1/FVC ratio: 37 %
Post FEV6/FVC ratio: 90 %
Pre FEV1/FVC ratio: 40 %
Pre FEV6/FVC Ratio: 98 %
RV % pred: 179 %
RV: 4.12 L
TLC % pred: 110 %
TLC: 7.31 L

## 2024-03-22 NOTE — Progress Notes (Signed)
 Subjective:    Patient ID: Duane Cuevas, male    DOB: 1956-03-10, 68 y.o.   MRN: [id number]  Patient Care Team: Purcell Emil Schanz, MD as PCP - General (Internal Medicine) Szabat, Toribio BROCKS, [facility] (Inactive) as Pharmacist (Pharmacist)  Chief Complaint  Patient presents with   Shortness of Breath    Shortness of breath on exertion. Cough with occasional white/yellow phlegm.     BACKGROUND/INTERVAL:   *History was obtained directly from the patient communicating in his native language (Spanish) in which I am proficient.  HPI Discussed the use of AI scribe software for clinical note transcription with the patient, who gave verbal consent to proceed.  History of Present Illness   Duane Cuevas is a 68 year old male with severe COPD who presents for follow-up.  He presents today with his daughter.  He has a history of COPD and emphysema, and today pulmonary function test showed 28% lung function. He has stopped smoking, which he believes may lead to some improvement over time. He uses Breztri  inhaler 2 puffs in the morning and 2 puffs in the afternoon, which he finds helpful, and albuterol  two to three times a day. He had a cold two weeks ago, resulting in some wheezing. He has received the flu vaccine.  He experiences occasional sleep disturbances, though not as frequently as before. He has not undergone a sleep study due to cost concerns, but it is noted that he has Medicare/Medicaid, which may cover the study if done in a laboratory setting.  He resides in [place] with his daughter Duane Cuevas.     DATA 08/14/2018 alpha-1 antitrypsin phenotype /level: MM phenotype, 169 mg/dL (normal). 12/14/2023 echocardiogram: Showed LVEF 65 to 70%, no wall motion abnormalities.  RV systolic function normal.  Normal RV size.  Moderately elevated pulmonary artery systolic pressure.  Inferior vena cava is dilated suggesting right atrial pressure 15 mmHg.  02/05/2024 nitric oxide : 14 ppb  (low) 03/05/2024 LDCT chest: Moderate centrilobular and paraseptal emphysema, scattered calcified granulomas, left upper lobe solid nodule 1.2 mm.  Benign appearance or behavior, lung RADS 2. 03/22/2024 PFTs: FEV1 0.89 L or 28% predicted, FVC 2.25 L or 54% predicted, FEV1/FVC 40%.  No bronchodilator response.  Lung volumes show mild hyperinflation and air trapping diffusion capacity moderately decreased.  Consistent with very severe chronic obstructive pulmonary disease.  Review of Systems A 10 point review of systems was performed and it is as noted above otherwise negative.   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 GOLD III/ active smoker 08/14/2018   Hypokalemia 09/23/2015   COPD (chronic obstructive pulmonary disease) (HCC) 10/14/2014   Dyslipidemia associated with type 2 diabetes mellitus (HCC) 10/14/2014    Social History   Tobacco Use   Smoking status: Every Day    Current packs/day: 1.00    Average packs/day: 1 pack/day for 53.7 years (53.7 ttl pk-yrs)    Types: Cigarettes  Start date: 29   Smokeless tobacco: Never   Tobacco comments:    02-27-2023  per pt daughter pt is still smoking, but hides how much per day,  smoking since age 74 (9)  Substance Use Topics   Alcohol use: Not Currently    Alcohol/week: 2.0  standard drinks of alcohol    Types: 2 Cans of beer per week    Comment: 2 beers per week    No Known Allergies  Current Meds  Medication Sig   albuterol  (PROVENTIL ) (2.5 MG/3ML) 0.083% nebulizer solution INHALE ONE VIAL VIA NEBULIZER EVERY 6 HOURS AS NEEDED FOR WHEEZING OR FOR SHORTNESS OF BREATH   albuterol  (VENTOLIN  HFA) 108 (90 Base) MCG/ACT inhaler 2 inhalaciones Inhalaci n Cada 6 horas PRN, sibilancias, dificultad para respirar   amLODipine  (NORVASC ) 5 MG tablet Take 1 tablet (5 mg total) by mouth daily. TOMAR 1 TABLETA POR VAI ORAL UNA VEZ AL DIA   aspirin  EC 81 MG tablet Take 1 tablet (81 mg total) by mouth daily. Swallow whole.   atorvastatin  (LIPITOR) 20 MG tablet Take 1 tablet (20 mg total) by mouth daily. TOMAR 1 TABLETA POR VIA ORAL UNA VEZ AL DIA   budesonide -glycopyrrolate-formoterol  (BREZTRI  AEROSPHERE) 160-9-4.8 MCG/ACT AERO inhaler INHALE TWO PUFFS BY MOUTH TWICE A DAY   Continuous Glucose Sensor (FREESTYLE LIBRE 14 DAY SENSOR) MISC Continuous blood glucose monitoring as needed daily   cyclobenzaprine  (FLEXERIL ) 10 MG tablet Take 1 tablet (10 mg total) by mouth 2 (two) times daily as needed for muscle spasms.   Dulaglutide  (TRULICITY ) 1.5 MG/0.5ML SOAJ INYECTAR CONTENIDOS DE UN LAPIZ POR VIA SUBCUTANEA CADA SEMANA, EL MISMO DIA CADA SEMANA   FARXIGA  10 MG TABS tablet TOMAR 1 TABLETA POR VIA ORAL UNA VEZ AL DIA   gabapentin  (NEURONTIN ) 300 MG capsule Take 1 capsule (300 mg total) by mouth at bedtime as needed.   glipiZIDE  (GLUCOTROL ) 10 MG tablet TOMAR 1 TABLETA POR VIA ORAL UNA VEZ AL DIA ANTES DEL DESAYUNO *TOMAR 1 TABLETA ADICIONAL SI AZUCAR EN LA SANGRE ESTA ALTA*   glucose blood test strip Use as instructed   guaifenesin  (ROBITUSSIN) 100 MG/5ML syrup Take 200 mg by mouth 3 (three) times daily as needed for cough.   ibuprofen  (ADVIL ) 800 MG tablet Take 1 tablet (800 mg total) by mouth every 8 (eight) hours as needed.   insulin  aspart (NOVOLOG  FLEXPEN) 100 UNIT/ML FlexPen  INYECTAR 3 UNIDADES POR VIA SUBCUTANEA TRES VECES AL DIA COMO INDICADO. AJUSTAR CANTIDAD DE INSULINA POR ESCALA MOVIL. MAS DOSIS 30 UNIDADES   mupirocin  ointment (BACTROBAN ) 2 % Apply 1 Application topically daily. 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  Follow discharge instructions on paperwork for duration   pantoprazole  (PROTONIX ) 40 MG tablet Take 1 tablet (40 mg total) by mouth daily. TOMAR 1 TABLETA POR VIA ORAL UNA VEZ AL DIA   sildenafil  (VIAGRA ) 100 MG tablet Take 0.5-1 tablets (50-100 mg total) by mouth daily as needed for erectile dysfunction.   TRESIBA  FLEXTOUCH 100 UNIT/ML FlexTouch Pen INYECTAR 25 UNIDADES POR VIA SUBCUTANEA DIARIAMENTE    Immunization History  Administered Date(s) Administered   Fluad Quad(high Dose 65+) 03/24/2021, 09/21/2022   Fluad Trivalent(High Dose 65+) 04/17/2023   Influenza,inj,Quad PF,6+ Mos 09/21/2015, 07/02/2018   Influenza-Unspecified 08/03/2020   PFIZER(Purple Top)SARS-COV-2 Vaccination 09/29/2019, 06/12/2020, 10/20/2020   PNEUMOCOCCAL CONJUGATE-20 12/29/2022   Pneumococcal Conjugate-13 09/21/2020   Pneumococcal Polysaccharide-23 10/15/2014, 09/21/2015        Objective:     BP 126/72  Pulse 87   Temp 97.7 F (36.5 C) (Temporal)   Ht 5' 8 (1.727 m)   Wt 166 lb 3.2 oz (75.4 kg)   SpO2 97%   BMI 25.27 kg/m   SpO2: 97 %  GENERAL: Well-developed, well-nourished gentleman, no acute distress.  No conversational dyspnea, fully ambulatory. HEAD: Normocephalic, atraumatic.  EYES: Pupils equal, round, reactive to light.  No scleral icterus.  MOUTH: Edentulous, wears dentures, oral mucosa moist.  No thrush.  Mallampati I airway NECK: Supple. No thyromegaly. Trachea midline. No JVD.  No adenopathy. PULMONARY: Good air entry bilaterally.  Coarse, faint, rare end expiratory wheeze. CARDIOVASCULAR: S1 and S2. Regular rate and rhythm.  No rubs, murmurs or gallops heard. ABDOMEN: Benign. MUSCULOSKELETAL: No joint deformity, no  clubbing, no edema.  NEUROLOGIC: No overt focal deficit, no gait disturbance, speech is fluent. SKIN: Intact,warm,dry. PSYCH: Mood and behavior normal.      Ambulatory oxymetry was performed today:  At rest on room air oxygen  saturation was 100%, the patient ambulated at a normal pace, completed 3 laps, O2 nadir 97%, mild shortness of breath.  Resting heart rate was 86 bpm at maximum for this exercise 100 bpm.  No significant oxygen  desaturations during exercise.   Assessment & Plan:     ICD-10-CM   1. COPD, very severe (HCC)  J44.9 AMB referral to pulmonary rehabilitation    2. Shortness of breath  R06.02 AMB referral to pulmonary rehabilitation    3. Pulmonary HTN (HCC)  I27.20     4. At risk for sleep apnea  Z91.89 Split night study    5. Personal history of tobacco use, presenting hazards to health  Z87.891       Orders Placed This Encounter  Procedures   AMB referral to pulmonary rehabilitation    Referral Priority:   Routine    Referral Type:   Consultation    Referral Reason:   Specialty Services Required    Number of Visits Requested:   1   Split night study    Standing Status:   Future    Expected Date:   04/05/2024    Expiration Date:   03/22/2025    Where should this test be performed::   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 Recent cessation of smoking. Recent upper respiratory infection causing wheezing. Oxygen  levels maintained well during ambulation. - Enroll in pulmonary rehabilitation program at [facility] - Use albuterol  inhaler as needed, up to 2-3 times daily  Sleep disturbance, evaluation for sleep apnea planned Intermittent sleep disturbance, less frequent than before. Evaluation for sleep apnea planned, with potential Medicare/Medicaid coverage if conducted in a laboratory setting. - Order sleep study at [facility] to evaluate  for sleep apnea   Pulmonary hypertension Likely on the basis of advanced COPD.  Suspect significant Cor pulmonale.  Advised if symptoms do not improve or worsen, to please contact office for sooner follow up or seek emergency care.    I spent 41 minutes of dedicated to the care of this patient on the date of this encounter to include pre-visit review of records, face-to-face time with the patient discussing conditions above, post visit ordering of testing, clinical documentation with the electronic health record, making appropriate referrals as documented, and communicating necessary findings to members of the patients care team.     C. Leita Sanders, MD Advanced Bronchoscopy PCCM [facility]    *  This note was generated using voice recognition software/Dragon and/or AI transcription program.  Despite best efforts to proofread, errors can occur which can change the meaning. Any transcriptional errors that result from this process are unintentional and may not be fully corrected at the time of dictation.

## 2024-03-22 NOTE — Patient Instructions (Signed)
 Full PFT completed today ? ?

## 2024-03-22 NOTE — Patient Instructions (Addendum)
 VISIT SUMMARY:  You came in today for a follow-up visit regarding your severe COPD and emphysema. You have stopped smoking, which is a positive step towards improving your lung function. You also mentioned experiencing occasional sleep disturbances.  YOUR PLAN:  -SEVERE CHRONIC OBSTRUCTIVE PULMONARY DISEASE WITH EMPHYSEMA: Severe COPD with emphysema means that your lung function is significantly reduced, currently at 28%. You recently stopped smoking, which can help improve your condition over time. You had a cold two weeks ago that caused some wheezing. Your oxygen  levels are stable during walking. Continue using your Breztri  inhaler twice daily and your albuterol  inhaler as needed, up to 2-3 times daily. Additionally, you should enroll in a pulmonary rehabilitation program at [facility].  -SLEEP DISTURBANCE, EVALUATION FOR SLEEP APNEA PLANNED: Sleep disturbance can sometimes be a sign of sleep apnea, a condition where breathing repeatedly stops and starts during sleep. Although your sleep disturbances are less frequent now, we will order a sleep study at [facility] to evaluate for sleep apnea. This study may be covered by Medicaid if done in a laboratory setting.  INSTRUCTIONS:  Please follow up with the pulmonary rehabilitation program at [facility] and schedule your sleep study at [facility]. Continue using your prescribed inhalers as directed.  RESUMEN DE LA VISITA:  Hoy acudi a una consulta de seguimiento por EPOC grave y enfisema. Ha dejado de fumar, lo cual representa un paso positivo para mejorar su funcin pulmonar. Tambin mencion experimentar trastornos ocasionales del sueo.  SU PLAN:  - ENFERMEDAD PULMONAR OBSTRUCTIVA CRNICA GRAVE CON ENFISEMA: La EPOC grave con enfisema significa que su funcin pulmonar est significativamente reducida, actualmente en un 28 %. Recientemente dej de fumar, lo que puede ayudar a mejorar su condicin con [employer]. Tuvo un resfriado hace dos  semanas que le caus sibilancias. Sus niveles de oxgeno se mantienen estables al caminar. [place] usando su inhalador Breztri  dos veces al da y su inhalador de albuterol  segn sea necesario, hasta 2 o 3 veces al da. Adems, debera inscribirse en un programa de rehabilitacin pulmonar en [facility].  Trastornos del sueo, evaluacin programada para apnea del sueo: Los trastornos del sueo a veces pueden ser un signo de apnea del sueo, una afeccin en la que la respiracin se detiene y se reanuda repetidamente durante el sueo. Aunque sus trastornos del sueo son menos frecuentes ahora, solicitaremos un estudio del sueo en [place] [place] para evaluar la apnea del sueo. Este estudio podra estar cubierto por Medicare/Medicaid si se [employer].  Instrucciones:  Por favor, consulte con el programa de rehabilitacin pulmonar de [facility] y programe su estudio del sueo en [employer]. [place] usando los inhaladores recetados segn las indicaciones.

## 2024-03-22 NOTE — Progress Notes (Signed)
 Full PFT completed today ? ?

## 2024-03-25 ENCOUNTER — Encounter (HOSPITAL_COMMUNITY): Payer: Self-pay

## 2024-03-25 ENCOUNTER — Telehealth (HOSPITAL_COMMUNITY): Payer: Self-pay

## 2024-03-25 NOTE — Telephone Encounter (Signed)
 Attempted to call patient in regards to Pulmonary Rehab via interpreter services - LM on VM   Sent letter

## 2024-03-28 ENCOUNTER — Encounter (HOSPITAL_COMMUNITY): Payer: Self-pay

## 2024-03-28 ENCOUNTER — Telehealth (HOSPITAL_COMMUNITY): Payer: Self-pay

## 2024-03-28 NOTE — Telephone Encounter (Signed)
 Called patient to see if he was interested in participating in the Pulmonary Rehab Program. Patient will come in for orientation on 9/29@1030  and will attend the 10:15 exercise class.   Sent package.

## 2024-03-28 NOTE — Telephone Encounter (Signed)
 Pt insurance is active and benefits verified through [facility] Co-pay 0, DED 0/0 met, out of pocket 0/0 met, co-insurance 0%. no pre-authorization required, [place]/[employer] 03/28/2024@9 :24, REF# [id number]  No visit limit for pulmonary rehab

## 2024-03-29 ENCOUNTER — Telehealth (HOSPITAL_COMMUNITY): Payer: Self-pay

## 2024-03-29 NOTE — Telephone Encounter (Signed)
 Called to confirm appt. No answer. Left VM.

## 2024-03-29 NOTE — Telephone Encounter (Signed)
 Pt returned call and is coming to [place] orientation on 04/01/24.

## 2024-04-01 ENCOUNTER — Encounter (HOSPITAL_COMMUNITY): Payer: Self-pay

## 2024-04-01 ENCOUNTER — Telehealth (HOSPITAL_COMMUNITY): Payer: Self-pay

## 2024-04-01 ENCOUNTER — Encounter (HOSPITAL_COMMUNITY)
Admission: RE | Admit: 2024-04-01 | Discharge: 2024-04-01 | Disposition: A | Discharge status: Home / Self Care | Source: Ambulatory Visit | Attending: Pulmonary Disease | Admitting: Pulmonary Disease

## 2024-04-01 VITALS — BP 112/60 | HR 86 | Ht 68.0 in | Wt 166.4 lb

## 2024-04-01 DIAGNOSIS — J449 Chronic obstructive pulmonary disease, unspecified: Secondary | ICD-10-CM | POA: Insufficient documentation

## 2024-04-01 NOTE — Progress Notes (Signed)
 Pulmonary Rehab Orientation Physical Assessment Note  Patient Details  Name: Aceson Labell MRN: [id number] Date of Birth: [birth year] Referring Provider:   [name] Pulmonary Rehab Walk Test from 04/01/2024 in [facility]  Referring Provider Tamea                                                    Physical assessment reveals patient is alert and oriented x 4. Heart rate is normal, breath sounds with expiratory wheezing to auscultation, no rales, or rhonchi. Reports non-productive cough. Bowel sounds present x4 quads. Pt denies abdominal discomfort, nausea, vomiting, diarrhea or constipation. Grip strength equal, strong. Distal pulses +2; no swelling to lower extremities.   Ronal Levin, RN, BSN

## 2024-04-01 NOTE — Telephone Encounter (Signed)
 Called pt with a Spanish interpreter from [employer], his ID was [phone]. Unable to leave message for pt, called daughter [name] and LVM about appointment today at Pulmonary Rehab and elevators being out of service. Awaiting call back.

## 2024-04-01 NOTE — Progress Notes (Signed)
 Duane Cuevas 68 y.o. male Pulmonary Rehab Orientation Note This patient who was referred to Pulmonary Rehab by Dr. Tamea with the diagnosis of COPD 4 arrived today in Cardiac and Pulmonary Rehab. He arrived ambulatory with normal gait. He does carry portable oxygen . [employer] is the provider for their DME. Per patient, Duane Cuevas uses oxygen  at night. Color good, skin warm and dry. Patient is oriented to time and place. Patient's medical history, psychosocial health, and medications reviewed. Psychosocial assessment reveals patient lives with family. Duane Cuevas is currently retired. Patient hobbies include spending time with family and taking care of animals. Patient reports her stress level is low. Areas of stress/anxiety include health. Patient does not exhibit signs of depression. PHQ2/9 score 0/0. Duane Cuevas shows good  coping skills with positive outlook on life. Offered emotional support and reassurance. Will continue to monitor. Physical assessment performed by Ronal Levin RN. Please see their orientation physical assessment note. Duane Cuevas reports he  does take medications as prescribed. Patient states he  follows a regular  diet. The patient reports no specific efforts to gain or lose weight.. Patient's weight will be monitored closely. Demonstration and practice of PLB using pulse oximeter. Duane Cuevas able to return demonstration satisfactorily. Safety and hand hygiene in the exercise area reviewed with patient. Duane Cuevas voices understanding of the information reviewed. Department expectations discussed with patient and achievable goals were set. The patient shows enthusiasm about attending the program and we look forward to working with [employer]. Duane Cuevas completed a 6 min walk test today and is scheduled to begin exercise on 04/09/24 at 10:15 am.  [id number] Duane JINNY Moats, MS, ACSM-CEP

## 2024-04-01 NOTE — Progress Notes (Signed)
 Duane Cuevas 68 y.o. male  Initial Psychosocial Assessment  Pt psychosocial assessment reveals pt lives with their spouse. Pt is currently retired. Pt hobbies include spending time with family and taking care of animals. Pt reports his  stress level is low. Areas of stress/anxiety include health.  Pt does not exhibit signs of depression. Pt shows good  coping skills with positive outlook . Offered emotional support and reassurance. Will continue to monitor.   04/01/2024 12:01 PM

## 2024-04-01 NOTE — Progress Notes (Addendum)
 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 TOMAR 1 TABLETA POR VAI ORAL UNA VEZ AL DIA, Disp: 90 tablet, Rfl: 3   aspirin  EC 81 MG tablet, Take 1 tablet (81 mg total) by mouth daily. Swallow whole., Disp: 30 tablet, Rfl: 0   atorvastatin  (LIPITOR) 20 MG tablet, Take 1 tablet (20 mg total) by mouth daily. TOMAR 1 TABLETA POR VIA ORAL UNA VEZ AL DIA, Disp: 90 tablet, Rfl: 3   budesonide -glycopyrrolate-formoterol  (BREZTRI  AEROSPHERE) 160-9-4.8 MCG/ACT AERO inhaler, INHALE TWO PUFFS BY MOUTH TWICE A DAY, Disp: 10.7 g, Rfl: 10   Continuous Glucose Sensor (FREESTYLE LIBRE 14 DAY SENSOR) MISC, Continuous blood glucose monitoring as needed daily, Disp: 1 each, Rfl: 7   cyclobenzaprine  (FLEXERIL ) 10 MG tablet, Take 1 tablet (10 mg total) by mouth 2 (two) times daily as needed for muscle spasms., Disp: 20 tablet, Rfl: 0   Dulaglutide  (TRULICITY ) 1.5 MG/0.5ML SOAJ, INYECTAR CONTENIDOS DE UN LAPIZ POR VIA SUBCUTANEA  CADA SEMANA, EL MISMO DIA CADA SEMANA, Disp: 2 mL, Rfl: 10   FARXIGA  10 MG TABS tablet, TOMAR 1 TABLETA POR VIA ORAL UNA VEZ AL DIA, Disp: 90 tablet, Rfl: 3   gabapentin  (NEURONTIN ) 300 MG capsule, Take 1 capsule (300 mg total) by mouth at bedtime as needed., Disp: 60 capsule, Rfl: 0   glipiZIDE  (GLUCOTROL ) 10 MG tablet, TOMAR 1 TABLETA POR VIA ORAL UNA VEZ AL DIA ANTES DEL DESAYUNO *TOMAR 1 TABLETA ADICIONAL SI AZUCAR EN LA SANGRE ESTA ALTA*, Disp: 90 tablet, Rfl: 3   glucose blood test strip, Use as instructed, Disp: 100 each, Rfl: 12   guaifenesin  (ROBITUSSIN) 100 MG/5ML syrup, Take 200 mg by mouth 3 (three) times daily as needed for cough., Disp: , Rfl:    ibuprofen  (ADVIL ) 800 MG tablet, Take 1 tablet (800 mg total) by mouth every 8 (eight) hours as needed., Disp: 30 tablet, Rfl: 0   insulin  aspart (NOVOLOG  FLEXPEN) 100 UNIT/ML FlexPen, INYECTAR 3 UNIDADES POR VIA SUBCUTANEA TRES VECES AL DIA COMO INDICADO. AJUSTAR CANTIDAD DE INSULINA POR ESCALA MOVIL. MAS DOSIS 30 UNIDADES, Disp: 15 mL, Rfl: 10   pantoprazole  (PROTONIX ) 40 MG tablet, Take 1 tablet (40 mg total) by mouth daily. TOMAR 1 TABLETA POR VIA ORAL UNA VEZ AL DIA, Disp: 90 tablet, Rfl: 3   TRESIBA  FLEXTOUCH 100 UNIT/ML FlexTouch Pen, INYECTAR 25 UNIDADES POR VIA SUBCUTANEA DIARIAMENTE, Disp: 15 mL, Rfl: 10   mupirocin  ointment (BACTROBAN ) 2 %, Apply 1 Application topically daily. Apply daily to your nose to decrease MRSA  colonization.  Follow discharge instructions on paperwork for duration (Patient not taking: Reported on 04/01/2024), Disp: 22 g, Rfl: 0   predniSONE  (DELTASONE ) 10 MG tablet, Take 4 tabs daily for 3 days, then 3 tabs daily for 3 days, then 2 tabs daily for 3 days, then 1 tab daily for 3 days, then 1/2 tab daily for 4 days. (Patient not taking: Reported on 04/01/2024), Disp: 32 tablet, Rfl: 0   sildenafil  (VIAGRA ) 100 MG tablet, Take 0.5-1 tablets (50-100 mg total) by mouth daily as needed for erectile dysfunction. (Patient  not taking: Reported on 04/01/2024), Disp: 5 tablet, Rfl: 11 No current facility-administered medications for this encounter.  Facility-Administered Medications Ordered in Other Encounters:    morphine  2 MG/ML injection, , , ,   Past Medical History: Past Medical History:  Diagnosis Date   Cerebral aneurysm, nonruptured    followed by dr n. 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   Coronary artery disease    (pt admitted for chest pain ) nuclear stress test 11-02-2019  low risk, nuclear ef 58%,  prior inferior defect of severe severity indicative of previous MI;;   echo 04-28-2020 ef 60-65%, mild concentric LVH, RSVP 54.60mmHg;;   cardiac CTA 09-15-2020  unremarkable,  calcium  score=zero   Dependence on nocturnal oxygen  therapy    02-27-2023  per pt daughter,  pt only uses at night w/ O2 sat ,2-3L via [place],  only uses portable if going outside when every hot which is not much   Diverticulosis of colon    DOE (dyspnea on exertion)    02-27-2023  per pt daugter sob w/ stairs, incline's,  ok w/ some yard work if not warm/ hot wearther,  can do house hold chores   ED (erectile dysfunction)    Emphysema/COPD    followed by pcp;   uses breztri  bid, uses preventil nebulizer every afternoon;   last exacerbation 06/ 2023 in epic   Full dentures    GERD (gastroesophageal reflux disease)    Hepatic steatosis    History of adenomatous polyp of colon    History of gastric ulcer    per EGD 09-16-2020  non-bleeding   History of MI (myocardial infarction)    per nuclear stress test in epic 11-02-2019 showed prior inferior defect of severe severity indicative of previous MI   History of pelvic fracture    1999  and 2009   Hyperlipidemia    Hypertension    Smokers' cough (HCC)    Type 2 diabetes mellitus treated with insulin  (HCC)    followed by pcp;  dx 2016   (02-27-2023  per pt daughter pt   Wears glasses      Tobacco Use: Social History   Tobacco Use  Smoking Status Every Day   Current packs/day: 1.00   Average packs/day: 1 pack/day for 53.7 years (53.7 ttl pk-yrs)   Types: Cigarettes   Start date: 24  Smokeless Tobacco Never  Tobacco Comments   02-27-2023  per pt daughter pt is still smoking, but hides how much per day,  smoking since age 17 (4)    Labs: Review Flowsheet  More data exists      Latest Ref Rng & Units 12/29/2022 03/09/2023 08/02/2023 12/14/2023 02/14/2024  Labs for ITP Cardiac and Pulmonary Rehab  Cholestrol 0 - 200 mg/dL 848  - - - 873   LDL (calc) 0 - 99 mg/dL 80  - - - 63  HDL-C >39.00 mg/dL 42.39  - - - 56.29   Trlycerides 0.0 - 149.0 mg/dL 29.9  - - - 07.9   Hemoglobin A1c 4.8 - 5.6 % 6.6  7.5  6.7  7.2  -    Capillary Blood Glucose: Lab Results  Component Value Date   GLUCAP 160 (H) 12/16/2023   GLUCAP 326 (H) 12/15/2023   GLUCAP 291 (H) 12/15/2023   GLUCAP 209 (H) 12/15/2023   GLUCAP 198 (H) 12/15/2023     Pulmonary Assessment Scores:  Pulmonary Assessment Scores     Row Name 04/01/24 1109         ADL UCSD   ADL Phase Entry     SOB Score total 88       CAT Score   CAT Score 22       mMRC Score   mMRC Score 4       UCSD: Self-administered rating of dyspnea associated with activities of daily living (ADLs) 6-point scale (0 = not at all to 5 = maximal or unable to do because of breathlessness)  Scoring Scores range from 0 to 120.  Minimally important difference is 5 units  CAT: CAT can identify the health impairment of COPD patients and is better correlated with disease progression.  CAT has a scoring range of zero to 40. The CAT score is classified into four groups of low (less than 10), medium (10 - 20), high (21-30) and very high (31-40) based on the impact level of disease on health status. A CAT score over 10 suggests significant symptoms.  A worsening CAT score could be explained by an exacerbation, poor medication  adherence, poor inhaler technique, or progression of COPD or comorbid conditions.  CAT MCID is 2 points  mMRC: mMRC (Modified Medical Research Council) Dyspnea Scale is used to assess the degree of baseline functional disability in patients of respiratory disease due to dyspnea. No minimal important difference is established. A decrease in score of 1 point or greater is considered a positive change.   Pulmonary Function Assessment:  Pulmonary Function Assessment - 04/01/24 1109       Breath   Bilateral Breath Sounds Decreased;Wheezes    Shortness of Breath Yes;Limiting activity;Fear of Shortness of Breath;Panic with Shortness of Breath          Exercise Target Goals: Exercise Program Goal: Individual exercise prescription set using results from initial 6 min walk test and THRR while considering  patient's activity barriers and safety.   Exercise Prescription Goal: Initial exercise prescription builds to 30-45 minutes a day of aerobic activity, 2-3 days per week.  Home exercise guidelines will be given to patient during program as part of exercise prescription that the participant will acknowledge.  Activity Barriers & Risk Stratification:  Activity Barriers & Cardiac Risk Stratification - 04/01/24 1112       Activity Barriers & Cardiac Risk Stratification   Activity Barriers Deconditioning;Muscular Weakness;Shortness of Breath;History of Falls;Back Problems          6 Minute Walk:  6 Minute Walk     Row Name 04/01/24 1202         6 Minute Walk   Phase Initial     Distance 1125 feet     Walk Time 6 minutes     # of Rest Breaks 0     MPH 2.13     METS 2.92     RPE 13     Perceived Dyspnea  3     VO2  Peak 10.22     Symptoms No     Resting HR 86 bpm     Resting BP 112/60     Resting Oxygen  Saturation  95 %     Exercise Oxygen  Saturation  during 6 min walk 90 %     Max Ex. HR 111 bpm     Max Ex. BP 126/64     2 Minute Post BP 108/60       Interval HR   1  Minute HR 107     2 Minute HR 104     3 Minute HR 101     4 Minute HR 103     5 Minute HR 111     6 Minute HR 108     2 Minute Post HR 90     Interval Heart Rate? Yes       Interval Oxygen    Interval Oxygen ? Yes     Baseline Oxygen  Saturation % 95 %     1 Minute Oxygen  Saturation % 94 %     1 Minute Liters of Oxygen  0 L     2 Minute Oxygen  Saturation % 93 %     2 Minute Liters of Oxygen  0 L     3 Minute Oxygen  Saturation % 92 %     3 Minute Liters of Oxygen  0 L     4 Minute Oxygen  Saturation % 91 %     4 Minute Liters of Oxygen  0 L     5 Minute Oxygen  Saturation % 91 %     5 Minute Liters of Oxygen  0 L     6 Minute Oxygen  Saturation % 90 %     6 Minute Liters of Oxygen  0 L     2 Minute Post Oxygen  Saturation % 97 %     2 Minute Post Liters of Oxygen  0 L        Oxygen  Initial Assessment:  Oxygen  Initial Assessment - 04/01/24 1051       Home Oxygen    Home Oxygen  Device Home Concentrator    Sleep Oxygen  Prescription Continuous    Liters per minute 2.5    Home Exercise Oxygen  Prescription None    Home Resting Oxygen  Prescription None    Compliance with Home Oxygen  Use Yes      Initial 6 min Walk   Oxygen  Used None      Program Oxygen  Prescription   Program Oxygen  Prescription None      Intervention   Short Term Goals To learn and exhibit compliance with exercise, home and travel O2 prescription;To learn and understand importance of maintaining oxygen  saturations>88%;To learn and demonstrate proper use of respiratory medications;To learn and understand importance of monitoring SPO2 with pulse oximeter and demonstrate accurate use of the pulse oximeter.;To learn and demonstrate proper pursed lip breathing techniques or other breathing techniques.     Long  Term Goals Exhibits compliance with exercise, home  and travel O2 prescription;Maintenance of O2 saturations>88%;Compliance with respiratory medication;Verbalizes importance of monitoring SPO2 with pulse oximeter and  return demonstration;Exhibits proper breathing techniques, such as pursed lip breathing or other method taught during program session;Demonstrates proper use of MDI's          Oxygen  Re-Evaluation:   Oxygen  Discharge (Final Oxygen  Re-Evaluation):   Initial Exercise Prescription:  Initial Exercise Prescription - 04/01/24 1200       Date of Initial Exercise RX and Referring Provider   Date 04/01/24    Referring Provider Duane Cuevas  Expected Discharge Date 07/09/24      Treadmill   MPH 1.4    Grade 0    Minutes 15      Recumbant Elliptical   Level 1    RPM 60    Watts 80    Minutes 15    METs 2      Prescription Details   Frequency (times per week) 2    Duration Progress to 30 minutes of continuous aerobic without signs/symptoms of physical distress      Intensity   THRR 40-80% of Max Heartrate 61-122    Ratings of Perceived Exertion 11-13    Perceived Dyspnea 0-4      Progression   Progression Continue to progress workloads to maintain intensity without signs/symptoms of physical distress.      Resistance Training   Training Prescription Yes    Weight blue bands    Reps 10-15          Perform Capillary Blood Glucose checks as needed.  Exercise Prescription Changes:   Exercise Comments:   Exercise Goals and Review:   Exercise Goals     Row Name 04/01/24 1050             Exercise Goals   Increase Physical Activity Yes       Intervention Provide advice, education, support and counseling about physical activity/exercise needs.;Develop an individualized exercise prescription for aerobic and resistive training based on initial evaluation findings, risk stratification, comorbidities and participant's personal goals.       Expected Outcomes Short Term: Attend rehab on a regular basis to increase amount of physical activity.;Long Term: Add in home exercise to make exercise part of routine and to increase amount of physical activity.;Long Term:  Exercising regularly at least 3-5 days a week.       Increase Strength and Stamina Yes       Intervention Provide advice, education, support and counseling about physical activity/exercise needs.;Develop an individualized exercise prescription for aerobic and resistive training based on initial evaluation findings, risk stratification, comorbidities and participant's personal goals.       Expected Outcomes Short Term: Increase workloads from initial exercise prescription for resistance, speed, and METs.;Short Term: Perform resistance training exercises routinely during rehab and add in resistance training at home;Long Term: Improve cardiorespiratory fitness, muscular endurance and strength as measured by increased METs and functional capacity ( )       Able to understand and use rate of perceived exertion (RPE) scale Yes       Intervention Provide education and explanation on how to use RPE scale       Expected Outcomes Short Term: Able to use RPE daily in rehab to express subjective intensity level;Long Term:  Able to use RPE to guide intensity level when exercising independently       Able to understand and use Dyspnea scale Yes       Intervention Provide education and explanation on how to use Dyspnea scale       Expected Outcomes Short Term: Able to use Dyspnea scale daily in rehab to express subjective sense of shortness of breath during exertion;Long Term: Able to use Dyspnea scale to guide intensity level when exercising independently       Knowledge and understanding of Target Heart Rate Range (THRR) Yes       Intervention Provide education and explanation of THRR including how the numbers were predicted and where they are located for reference       Expected  Outcomes Short Term: Able to state/look up THRR;Long Term: Able to use THRR to govern intensity when exercising independently;Short Term: Able to use daily as guideline for intensity in rehab       Understanding of Exercise Prescription  Yes       Intervention Provide education, explanation, and written materials on patient's individual exercise prescription       Expected Outcomes Short Term: Able to explain program exercise prescription;Long Term: Able to explain home exercise prescription to exercise independently          Exercise Goals Re-Evaluation :   Discharge Exercise Prescription (Final Exercise Prescription Changes):   Nutrition:  Target Goals: Understanding of nutrition guidelines, daily intake of sodium 1500mg , cholesterol 200mg , calories 30% from fat and 7% or less from saturated fats, daily to have 5 or more servings of fruits and vegetables.  Biometrics:  Pre Biometrics - 04/01/24 1048       Pre Biometrics   Grip Strength 26 kg           Nutrition Therapy Plan and Nutrition Goals:   Nutrition Assessments:  MEDIFICTS Score Key: >=70 Need to make dietary changes  40-70 Heart Healthy Diet <= 40 Therapeutic Level Cholesterol Diet   Picture Your Plate Scores: <59 Unhealthy dietary pattern with much room for improvement. 41-50 Dietary pattern unlikely to meet recommendations for good health and room for improvement. 51-60 More healthful dietary pattern, with some room for improvement.  >60 Healthy dietary pattern, although there may be some specific behaviors that could be improved.    Nutrition Goals Re-Evaluation:   Nutrition Goals Discharge (Final Nutrition Goals Re-Evaluation):   Psychosocial: Target Goals: Acknowledge presence or absence of significant depression and/or stress, maximize coping skills, provide positive support system. Participant is able to verbalize types and ability to use techniques and skills needed for reducing stress and depression.  Initial Review & Psychosocial Screening:  Initial Psych Review & Screening - 04/01/24 1052       Initial Review   Current issues with None Identified      Family Dynamics   Good Support System? Yes    Comments spouse  and daughter      Barriers   Psychosocial barriers to participate in program There are no identifiable barriers or psychosocial needs.      Screening Interventions   Interventions Encouraged to exercise          Quality of Life Scores:  Scores of 19 and below usually indicate a poorer quality of life in these areas.  A difference of  2-3 points is a clinically meaningful difference.  A difference of 2-3 points in the total score of the Quality of Life Index has been associated with significant improvement in overall quality of life, self-image, physical symptoms, and general health in studies assessing change in quality of life.  PHQ-9: Review Flowsheet  More data exists      04/01/2024 01/11/2024 12/21/2023 10/24/2023 10/03/2023  Depression screen PHQ 2/9  Decreased Interest 0 0 0 0 0  Down, Depressed, Hopeless 0 0 0 0 -  PHQ - 2 Score 0 0 0 0 0  Altered sleeping 0 0 - - -  Tired, decreased energy 0 3 - - -  Change in appetite 0 0 - - -  Feeling bad or failure about yourself  0 0 - - -  Trouble concentrating 0 1 - - -  Moving slowly or fidgety/restless 0 0 - - -  Suicidal thoughts 0 0 - - -  PHQ-9 Score 0 4 - - -  Difficult doing work/chores Not difficult at all Not difficult at all - - -   Interpretation of Total Score  Total Score Depression Severity:  1-4 = Minimal depression, 5-9 = Mild depression, 10-14 = Moderate depression, 15-19 = Moderately severe depression, 20-27 = Severe depression   Psychosocial Evaluation and Intervention:  Psychosocial Evaluation - 04/01/24 1052       Psychosocial Evaluation & Interventions   Interventions Encouraged to exercise with the program and follow exercise prescription    Comments Duane Cuevas denies any psychosocial barriers at this time.    Expected Outcomes For Duane Cuevas to participate in PR free of psychosocial concerns.    Continue Psychosocial Services  No Follow up required          Psychosocial Re-Evaluation:   Psychosocial  Discharge (Final Psychosocial Re-Evaluation):   Education: Education Goals: Education classes will be provided on a weekly basis, covering required topics. Participant will state understanding/return demonstration of topics presented.  Learning Barriers/Preferences:  Learning Barriers/Preferences - 04/01/24 1053       Learning Barriers/Preferences   Learning Barriers Language    Learning Preferences None          Education Topics: Know Your Numbers Group instruction that is supported by a PowerPoint presentation. Instructor discusses importance of knowing and understanding resting, exercise, and post-exercise oxygen  saturation, heart rate, and blood pressure. Oxygen  saturation, heart rate, blood pressure, rating of perceived exertion, and dyspnea are reviewed along with a normal range for these values.    Exercise for the Pulmonary Patient Group instruction that is supported by a PowerPoint presentation. Instructor discusses benefits of exercise, core components of exercise, frequency, duration, and intensity of an exercise routine, importance of utilizing pulse oximetry during exercise, safety while exercising, and options of places to exercise outside of rehab.    MET Level  Group instruction provided by PowerPoint, verbal discussion, and written material to support subject matter. Instructor reviews what METs are and how to increase METs.    Pulmonary Medications Verbally interactive group education provided by instructor with focus on inhaled medications and proper administration.   Anatomy and Physiology of the Respiratory System Group instruction provided by PowerPoint, verbal discussion, and written material to support subject matter. Instructor reviews respiratory cycle and anatomical components of the respiratory system and their functions. Instructor also reviews differences in obstructive and restrictive respiratory diseases with examples of each.    Oxygen   Safety Group instruction provided by PowerPoint, verbal discussion, and written material to support subject matter. There is an overview of "What is Oxygen " and "Why do we need it".  Instructor also reviews how to create a safe environment for oxygen  use, the importance of using oxygen  as prescribed, and the risks of noncompliance. There is a brief discussion on traveling with oxygen  and resources the patient may utilize.   Oxygen  Use Group instruction provided by PowerPoint, verbal discussion, and written material to discuss how supplemental oxygen  is prescribed and different types of oxygen  supply systems. Resources for more information are provided.    Breathing Techniques Group instruction that is supported by demonstration and informational handouts. Instructor discusses the benefits of pursed lip and diaphragmatic breathing and detailed demonstration on how to perform both.     Risk Factor Reduction Group instruction that is supported by a PowerPoint presentation. Instructor discusses the definition of a risk factor, different risk factors for pulmonary disease, and how the heart and lungs work together.   Pulmonary Diseases  Group instruction provided by PowerPoint, verbal discussion, and written material to support subject matter. Instructor gives an overview of the different type of pulmonary diseases. There is also a discussion on risk factors and symptoms as well as ways to manage the diseases.   Stress and Energy Conservation Group instruction provided by PowerPoint, verbal discussion, and written material to support subject matter. Instructor gives an overview of stress and the impact it can have on the body. Instructor also reviews ways to reduce stress. There is also a discussion on energy conservation and ways to conserve energy throughout the day.   Warning Signs and Symptoms Group instruction provided by PowerPoint, verbal discussion, and written material to support subject  matter. Instructor reviews warning signs and symptoms of stroke, heart attack, cold and flu. Instructor also reviews ways to prevent the spread of infection.   Other Education Group or individual verbal, written, or video instructions that support the educational goals of the pulmonary rehab program.    Knowledge Questionnaire Score:  Knowledge Questionnaire Score - 04/01/24 1207       Knowledge Questionnaire Score   Pre Score 15/18          Core Components/Risk Factors/Patient Goals at Admission:  Personal Goals and Risk Factors at Admission - 04/01/24 1053       Core Components/Risk Factors/Patient Goals on Admission   Improve shortness of breath with ADL's Yes    Intervention Provide education, individualized exercise plan and daily activity instruction to help decrease symptoms of SOB with activities of daily living.    Expected Outcomes Short Term: Improve cardiorespiratory fitness to achieve a reduction of symptoms when performing ADLs;Long Term: Be able to perform more ADLs without symptoms or delay the onset of symptoms          Core Components/Risk Factors/Patient Goals Review:    Core Components/Risk Factors/Patient Goals at Discharge (Final Review):    ITP Comments: Dr. 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

## 2024-04-03 NOTE — Progress Notes (Signed)
 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 TOMAR 1 TABLETA POR VAI ORAL UNA VEZ AL DIA, Disp: 90 tablet, Rfl: 3   aspirin  EC 81 MG tablet, Take 1 tablet (81 mg total) by mouth daily. Swallow whole., Disp: 30 tablet, Rfl: 0   atorvastatin  (LIPITOR) 20 MG tablet, Take 1 tablet (20 mg total) by mouth daily. TOMAR 1 TABLETA POR VIA ORAL UNA VEZ AL DIA, Disp: 90 tablet, Rfl: 3   budesonide -glycopyrrolate-formoterol  (BREZTRI  AEROSPHERE) 160-9-4.8 MCG/ACT AERO inhaler, INHALE TWO PUFFS BY MOUTH TWICE A DAY, Disp: 10.7 g, Rfl: 10   Continuous Glucose Sensor (FREESTYLE LIBRE 14 DAY SENSOR) MISC, Continuous blood glucose monitoring as needed daily, Disp: 1 each, Rfl: 7   cyclobenzaprine  (FLEXERIL ) 10 MG tablet, Take 1 tablet (10 mg total) by mouth 2 (two) times daily as needed for muscle spasms., Disp: 20 tablet, Rfl: 0   Dulaglutide  (TRULICITY ) 1.5 MG/0.5ML SOAJ, INYECTAR CONTENIDOS DE UN LAPIZ POR VIA SUBCUTANEA  CADA SEMANA, EL MISMO DIA CADA SEMANA, Disp: 2 mL, Rfl: 10   FARXIGA  10 MG TABS tablet, TOMAR 1 TABLETA POR VIA ORAL UNA VEZ AL DIA, Disp: 90 tablet, Rfl: 3   gabapentin  (NEURONTIN ) 300 MG capsule, Take 1 capsule (300 mg total) by mouth at bedtime as needed., Disp: 60 capsule, Rfl: 0   glipiZIDE  (GLUCOTROL ) 10 MG tablet, TOMAR 1 TABLETA POR VIA ORAL UNA VEZ AL DIA ANTES DEL DESAYUNO *TOMAR 1 TABLETA ADICIONAL SI AZUCAR EN LA SANGRE ESTA ALTA*, Disp: 90 tablet, Rfl: 3   glucose blood test strip, Use as instructed, Disp: 100 each, Rfl: 12   guaifenesin  (ROBITUSSIN) 100 MG/5ML syrup, Take 200 mg by mouth 3 (three) times daily as needed for cough., Disp: , Rfl:    ibuprofen  (ADVIL ) 800 MG tablet, Take 1 tablet (800 mg total) by mouth every 8 (eight) hours as needed., Disp: 30 tablet, Rfl: 0   insulin  aspart (NOVOLOG  FLEXPEN) 100 UNIT/ML FlexPen, INYECTAR 3 UNIDADES POR VIA SUBCUTANEA TRES VECES AL DIA COMO INDICADO. AJUSTAR CANTIDAD DE INSULINA POR ESCALA MOVIL. MAS DOSIS 30 UNIDADES, Disp: 15 mL, Rfl: 10   pantoprazole  (PROTONIX ) 40 MG tablet, Take 1 tablet (40 mg total) by mouth daily. TOMAR 1 TABLETA POR VIA ORAL UNA VEZ AL DIA, Disp: 90 tablet, Rfl: 3   TRESIBA  FLEXTOUCH 100 UNIT/ML FlexTouch Pen, INYECTAR 25 UNIDADES POR VIA SUBCUTANEA DIARIAMENTE, Disp: 15 mL, Rfl: 10   mupirocin  ointment (BACTROBAN ) 2 %, Apply 1 Application topically daily. Apply daily to your nose to decrease MRSA  colonization.  Follow discharge instructions on paperwork for duration (Patient not taking: Reported on 04/01/2024), Disp: 22 g, Rfl: 0   predniSONE  (DELTASONE ) 10 MG tablet, Take 4 tabs daily for 3 days, then 3 tabs daily for 3 days, then 2 tabs daily for 3 days, then 1 tab daily for 3 days, then 1/2 tab daily for 4 days. (Patient not taking: Reported on 04/01/2024), Disp: 32 tablet, Rfl: 0   sildenafil  (VIAGRA ) 100 MG tablet, Take 0.5-1 tablets (50-100 mg total) by mouth daily as needed for erectile dysfunction. (Patient  not taking: Reported on 04/01/2024), Disp: 5 tablet, Rfl: 11 No current facility-administered medications for this encounter.  Facility-Administered Medications Ordered in Other Encounters:    morphine  2 MG/ML injection, , , ,   Past Medical History: Past Medical History:  Diagnosis Date   Cerebral aneurysm, nonruptured    followed by dr n. 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   Coronary artery disease    (pt admitted for chest pain ) nuclear stress test 11-02-2019  low risk, nuclear ef 58%,  prior inferior defect of severe severity indicative of previous MI;;   echo 04-28-2020 ef 60-65%, mild concentric LVH, RSVP 54.66mmHg;;   cardiac CTA 09-15-2020  unremarkable,  calcium  score=zero   Dependence on nocturnal oxygen  therapy    02-27-2023  per pt daughter,  pt only uses at night w/ O2 sat ,2-3L via [place],  only uses portable if going outside when every hot which is not much   Diverticulosis of colon    DOE (dyspnea on exertion)    02-27-2023  per pt daugter sob w/ stairs, incline's,  ok w/ some yard work if not warm/ hot wearther,  can do house hold chores   ED (erectile dysfunction)    Emphysema/COPD    followed by pcp;   uses breztri  bid, uses preventil nebulizer every afternoon;   last exacerbation 06/ 2023 in epic   Full dentures    GERD (gastroesophageal reflux disease)    Hepatic steatosis    History of adenomatous polyp of colon    History of gastric ulcer    per EGD 09-16-2020  non-bleeding   History of MI (myocardial infarction)    per nuclear stress test in epic 11-02-2019 showed prior inferior defect of severe severity indicative of previous MI   History of pelvic fracture    1999  and 2009   Hyperlipidemia    Hypertension    Smokers' cough (HCC)    Type 2 diabetes mellitus treated with insulin  (HCC)    followed by pcp;  dx 2016   (02-27-2023  per pt daughter pt   Wears glasses      Tobacco Use: Social History   Tobacco Use  Smoking Status Every Day   Current packs/day: 1.00   Average packs/day: 1 pack/day for 53.7 years (53.7 ttl pk-yrs)   Types: Cigarettes   Start date: 61  Smokeless Tobacco Never  Tobacco Comments   02-27-2023  per pt daughter pt is still smoking, but hides how much per day,  smoking since age 20 (69)    Labs: Review Flowsheet  More data exists      Latest Ref Rng & Units 12/29/2022 03/09/2023 08/02/2023 12/14/2023 02/14/2024  Labs for ITP Cardiac and Pulmonary Rehab  Cholestrol 0 - 200 mg/dL 848  - - - 873   LDL (calc) 0 - 99 mg/dL 80  - - - 63  HDL-C >39.00 mg/dL 42.39  - - - 56.29   Trlycerides 0.0 - 149.0 mg/dL 29.9  - - - 07.9   Hemoglobin A1c 4.8 - 5.6 % 6.6  7.5  6.7  7.2  -    Capillary Blood Glucose: Lab Results  Component Value Date   GLUCAP 160 (H) 12/16/2023   GLUCAP 326 (H) 12/15/2023   GLUCAP 291 (H) 12/15/2023   GLUCAP 209 (H) 12/15/2023   GLUCAP 198 (H) 12/15/2023     Pulmonary Assessment Scores:  Pulmonary Assessment Scores     Row Name 04/01/24 1109         ADL UCSD   ADL Phase Entry     SOB Score total 88       CAT Score   CAT Score 22       mMRC Score   mMRC Score 4       UCSD: Self-administered rating of dyspnea associated with activities of daily living (ADLs) 6-point scale (0 = not at all to 5 = maximal or unable to do because of breathlessness)  Scoring Scores range from 0 to 120.  Minimally important difference is 5 units  CAT: CAT can identify the health impairment of COPD patients and is better correlated with disease progression.  CAT has a scoring range of zero to 40. The CAT score is classified into four groups of low (less than 10), medium (10 - 20), high (21-30) and very high (31-40) based on the impact level of disease on health status. A CAT score over 10 suggests significant symptoms.  A worsening CAT score could be explained by an exacerbation, poor medication  adherence, poor inhaler technique, or progression of COPD or comorbid conditions.  CAT MCID is 2 points  mMRC: mMRC (Modified Medical Research Council) Dyspnea Scale is used to assess the degree of baseline functional disability in patients of respiratory disease due to dyspnea. No minimal important difference is established. A decrease in score of 1 point or greater is considered a positive change.   Pulmonary Function Assessment:  Pulmonary Function Assessment - 04/01/24 1109       Breath   Bilateral Breath Sounds Decreased;Wheezes    Shortness of Breath Yes;Limiting activity;Fear of Shortness of Breath;Panic with Shortness of Breath          Exercise Target Goals: Exercise Program Goal: Individual exercise prescription set using results from initial 6 min walk test and THRR while considering  patient's activity barriers and safety.   Exercise Prescription Goal: Initial exercise prescription builds to 30-45 minutes a day of aerobic activity, 2-3 days per week.  Home exercise guidelines will be given to patient during program as part of exercise prescription that the participant will acknowledge.  Activity Barriers & Risk Stratification:  Activity Barriers & Cardiac Risk Stratification - 04/01/24 1112       Activity Barriers & Cardiac Risk Stratification   Activity Barriers Deconditioning;Muscular Weakness;Shortness of Breath;History of Falls;Back Problems          6 Minute Walk:  6 Minute Walk     Row Name 04/01/24 1202         6 Minute Walk   Phase Initial     Distance 1125 feet     Walk Time 6 minutes     # of Rest Breaks 0     MPH 2.13     METS 2.92     RPE 13     Perceived Dyspnea  3     VO2  Peak 10.22     Symptoms No     Resting HR 86 bpm     Resting BP 112/60     Resting Oxygen  Saturation  95 %     Exercise Oxygen  Saturation  during 6 min walk 90 %     Max Ex. HR 111 bpm     Max Ex. BP 126/64     2 Minute Post BP 108/60       Interval HR   1  Minute HR 107     2 Minute HR 104     3 Minute HR 101     4 Minute HR 103     5 Minute HR 111     6 Minute HR 108     2 Minute Post HR 90     Interval Heart Rate? Yes       Interval Oxygen    Interval Oxygen ? Yes     Baseline Oxygen  Saturation % 95 %     1 Minute Oxygen  Saturation % 94 %     1 Minute Liters of Oxygen  0 L     2 Minute Oxygen  Saturation % 93 %     2 Minute Liters of Oxygen  0 L     3 Minute Oxygen  Saturation % 92 %     3 Minute Liters of Oxygen  0 L     4 Minute Oxygen  Saturation % 91 %     4 Minute Liters of Oxygen  0 L     5 Minute Oxygen  Saturation % 91 %     5 Minute Liters of Oxygen  0 L     6 Minute Oxygen  Saturation % 90 %     6 Minute Liters of Oxygen  0 L     2 Minute Post Oxygen  Saturation % 97 %     2 Minute Post Liters of Oxygen  0 L        Oxygen  Initial Assessment:  Oxygen  Initial Assessment - 04/01/24 1051       Home Oxygen    Home Oxygen  Device Home Concentrator    Sleep Oxygen  Prescription Continuous    Liters per minute 2.5    Home Exercise Oxygen  Prescription None    Home Resting Oxygen  Prescription None    Compliance with Home Oxygen  Use Yes      Initial 6 min Walk   Oxygen  Used None      Program Oxygen  Prescription   Program Oxygen  Prescription None      Intervention   Short Term Goals To learn and exhibit compliance with exercise, home and travel O2 prescription;To learn and understand importance of maintaining oxygen  saturations>88%;To learn and demonstrate proper use of respiratory medications;To learn and understand importance of monitoring SPO2 with pulse oximeter and demonstrate accurate use of the pulse oximeter.;To learn and demonstrate proper pursed lip breathing techniques or other breathing techniques.     Long  Term Goals Exhibits compliance with exercise, home  and travel O2 prescription;Maintenance of O2 saturations>88%;Compliance with respiratory medication;Verbalizes importance of monitoring SPO2 with pulse oximeter and  return demonstration;Exhibits proper breathing techniques, such as pursed lip breathing or other method taught during program session;Demonstrates proper use of MDI's          Oxygen  Re-Evaluation:  Oxygen  Re-Evaluation     Row Name 04/02/24 0759             Program Oxygen  Prescription   Program Oxygen  Prescription None         Home Oxygen   Home Oxygen  Device Home Concentrator       Sleep Oxygen  Prescription Continuous       Liters per minute 2.5       Home Exercise Oxygen  Prescription None       Home Resting Oxygen  Prescription None       Compliance with Home Oxygen  Use Yes         Goals/Expected Outcomes   Short Term Goals To learn and exhibit compliance with exercise, home and travel O2 prescription;To learn and understand importance of maintaining oxygen  saturations>88%;To learn and demonstrate proper use of respiratory medications;To learn and understand importance of monitoring SPO2 with pulse oximeter and demonstrate accurate use of the pulse oximeter.;To learn and demonstrate proper pursed lip breathing techniques or other breathing techniques.        Long  Term Goals Exhibits compliance with exercise, home  and travel O2 prescription;Maintenance of O2 saturations>88%;Compliance with respiratory medication;Verbalizes importance of monitoring SPO2 with pulse oximeter and return demonstration;Exhibits proper breathing techniques, such as pursed lip breathing or other method taught during program session;Demonstrates proper use of MDI's       Goals/Expected Outcomes Compliance and understanding of oxygen  saturation monitoring and breathing techniques to decrease shortness of breath.          Oxygen  Discharge (Final Oxygen  Re-Evaluation):  Oxygen  Re-Evaluation - 04/02/24 0759       Program Oxygen  Prescription   Program Oxygen  Prescription None      Home Oxygen    Home Oxygen  Device Home Concentrator    Sleep Oxygen  Prescription Continuous    Liters per minute 2.5     Home Exercise Oxygen  Prescription None    Home Resting Oxygen  Prescription None    Compliance with Home Oxygen  Use Yes      Goals/Expected Outcomes   Short Term Goals To learn and exhibit compliance with exercise, home and travel O2 prescription;To learn and understand importance of maintaining oxygen  saturations>88%;To learn and demonstrate proper use of respiratory medications;To learn and understand importance of monitoring SPO2 with pulse oximeter and demonstrate accurate use of the pulse oximeter.;To learn and demonstrate proper pursed lip breathing techniques or other breathing techniques.     Long  Term Goals Exhibits compliance with exercise, home  and travel O2 prescription;Maintenance of O2 saturations>88%;Compliance with respiratory medication;Verbalizes importance of monitoring SPO2 with pulse oximeter and return demonstration;Exhibits proper breathing techniques, such as pursed lip breathing or other method taught during program session;Demonstrates proper use of MDI's    Goals/Expected Outcomes Compliance and understanding of oxygen  saturation monitoring and breathing techniques to decrease shortness of breath.          Initial Exercise Prescription:  Initial Exercise Prescription - 04/01/24 1200       Date of Initial Exercise RX and Referring Provider   Date 04/01/24    Referring Provider Tamea    Expected Discharge Date 07/09/24      Treadmill   MPH 1.4    Grade 0    Minutes 15      Recumbant Elliptical   Level 1    RPM 60    Watts 80    Minutes 15    METs 2      Prescription Details   Frequency (times per week) 2    Duration Progress to 30 minutes of continuous aerobic without signs/symptoms of physical distress      Intensity   THRR 40-80% of Max Heartrate 61-122    Ratings of Perceived Exertion 11-13    Perceived  Dyspnea 0-4      Progression   Progression Continue to progress workloads to maintain intensity without signs/symptoms of physical distress.       Resistance Training   Training Prescription Yes    Weight blue bands    Reps 10-15          Perform Capillary Blood Glucose checks as needed.  Exercise Prescription Changes:   Exercise Comments:   Exercise Goals and Review:   Exercise Goals     Row Name 04/01/24 1050             Exercise Goals   Increase Physical Activity Yes       Intervention Provide advice, education, support and counseling about physical activity/exercise needs.;Develop an individualized exercise prescription for aerobic and resistive training based on initial evaluation findings, risk stratification, comorbidities and participant's personal goals.       Expected Outcomes Short Term: Attend rehab on a regular basis to increase amount of physical activity.;Long Term: Add in home exercise to make exercise part of routine and to increase amount of physical activity.;Long Term: Exercising regularly at least 3-5 days a week.       Increase Strength and Stamina Yes       Intervention Provide advice, education, support and counseling about physical activity/exercise needs.;Develop an individualized exercise prescription for aerobic and resistive training based on initial evaluation findings, risk stratification, comorbidities and participant's personal goals.       Expected Outcomes Short Term: Increase workloads from initial exercise prescription for resistance, speed, and METs.;Short Term: Perform resistance training exercises routinely during rehab and add in resistance training at home;Long Term: Improve cardiorespiratory fitness, muscular endurance and strength as measured by increased METs and functional capacity ( )       Able to understand and use rate of perceived exertion (RPE) scale Yes       Intervention Provide education and explanation on how to use RPE scale       Expected Outcomes Short Term: Able to use RPE daily in rehab to express subjective intensity level;Long Term:  Able to use RPE to  guide intensity level when exercising independently       Able to understand and use Dyspnea scale Yes       Intervention Provide education and explanation on how to use Dyspnea scale       Expected Outcomes Short Term: Able to use Dyspnea scale daily in rehab to express subjective sense of shortness of breath during exertion;Long Term: Able to use Dyspnea scale to guide intensity level when exercising independently       Knowledge and understanding of Target Heart Rate Range (THRR) Yes       Intervention Provide education and explanation of THRR including how the numbers were predicted and where they are located for reference       Expected Outcomes Short Term: Able to state/look up THRR;Long Term: Able to use THRR to govern intensity when exercising independently;Short Term: Able to use daily as guideline for intensity in rehab       Understanding of Exercise Prescription Yes       Intervention Provide education, explanation, and written materials on patient's individual exercise prescription       Expected Outcomes Short Term: Able to explain program exercise prescription;Long Term: Able to explain home exercise prescription to exercise independently          Exercise Goals Re-Evaluation :  Exercise Goals Re-Evaluation     Row Name 04/02/24 [id number]  Exercise Goal Re-Evaluation   Exercise Goals Review Increase Physical Activity;Able to understand and use Dyspnea scale;Understanding of Exercise Prescription;Increase Strength and Stamina;Knowledge and understanding of Target Heart Rate Range (THRR);Able to understand and use rate of perceived exertion (RPE) scale       Comments Kenai is scheduled to begin exercise on 10/7. Will continue to monitor and progress as able.       Expected Outcomes Through exercise at rehab and home, the patient will decrease shortness of breath with daily activities and feel confident in carrying out an exercise regimen at home.          Discharge  Exercise Prescription (Final Exercise Prescription Changes):   Nutrition:  Target Goals: Understanding of nutrition guidelines, daily intake of sodium 1500mg , cholesterol 200mg , calories 30% from fat and 7% or less from saturated fats, daily to have 5 or more servings of fruits and vegetables.  Biometrics:  Pre Biometrics - 04/01/24 1048       Pre Biometrics   Grip Strength 26 kg           Nutrition Therapy Plan and Nutrition Goals:   Nutrition Assessments:  MEDIFICTS Score Key: >=70 Need to make dietary changes  40-70 Heart Healthy Diet <= 40 Therapeutic Level Cholesterol Diet   Picture Your Plate Scores: <59 Unhealthy dietary pattern with much room for improvement. 41-50 Dietary pattern unlikely to meet recommendations for good health and room for improvement. 51-60 More healthful dietary pattern, with some room for improvement.  >60 Healthy dietary pattern, although there may be some specific behaviors that could be improved.    Nutrition Goals Re-Evaluation:   Nutrition Goals Discharge (Final Nutrition Goals Re-Evaluation):   Psychosocial: Target Goals: Acknowledge presence or absence of significant depression and/or stress, maximize coping skills, provide positive support system. Participant is able to verbalize types and ability to use techniques and skills needed for reducing stress and depression.  Initial Review & Psychosocial Screening:  Initial Psych Review & Screening - 04/01/24 1052       Initial Review   Current issues with None Identified      Family Dynamics   Good Support System? Yes    Comments spouse and daughter      Barriers   Psychosocial barriers to participate in program There are no identifiable barriers or psychosocial needs.      Screening Interventions   Interventions Encouraged to exercise          Quality of Life Scores:  Scores of 19 and below usually indicate a poorer quality of life in these areas.  A difference of   2-3 points is a clinically meaningful difference.  A difference of 2-3 points in the total score of the Quality of Life Index has been associated with significant improvement in overall quality of life, self-image, physical symptoms, and general health in studies assessing change in quality of life.  PHQ-9: Review Flowsheet  More data exists      04/01/2024 01/11/2024 12/21/2023 10/24/2023 10/03/2023  Depression screen PHQ 2/9  Decreased Interest 0 0 0 0 0  Down, Depressed, Hopeless 0 0 0 0 -  PHQ - 2 Score 0 0 0 0 0  Altered sleeping 0 0 - - -  Tired, decreased energy 0 3 - - -  Change in appetite 0 0 - - -  Feeling bad or failure about yourself  0 0 - - -  Trouble concentrating 0 1 - - -  Moving slowly or fidgety/restless 0 0 - - -  Suicidal thoughts 0 0 - - -  PHQ-9 Score 0 4 - - -  Difficult doing work/chores Not difficult at all Not difficult at all - - -   Interpretation of Total Score  Total Score Depression Severity:  1-4 = Minimal depression, 5-9 = Mild depression, 10-14 = Moderate depression, 15-19 = Moderately severe depression, 20-27 = Severe depression   Psychosocial Evaluation and Intervention:  Psychosocial Evaluation - 04/01/24 1052       Psychosocial Evaluation & Interventions   Interventions Encouraged to exercise with the program and follow exercise prescription    Comments Skanda denies any psychosocial barriers at this time.    Expected Outcomes For Zacherie to participate in PR free of psychosocial concerns.    Continue Psychosocial Services  No Follow up required          Psychosocial Re-Evaluation:  Psychosocial Re-Evaluation     Row Name 04/02/24 [phone]             Psychosocial Re-Evaluation   Current issues with None Identified       Comments Kalief is scheduled to start PR on 04/09/24. No new psychosocial barriers or concerns since orientation.       Expected Outcomes For Gurnie to participate in PR free of any psychosocial barriers or concerns        Interventions Encouraged to attend Pulmonary Rehabilitation for the exercise       Continue Psychosocial Services  No Follow up required          Psychosocial Discharge (Final Psychosocial Re-Evaluation):  Psychosocial Re-Evaluation - 04/02/24 9061       Psychosocial Re-Evaluation   Current issues with None Identified    Comments Twan is scheduled to start PR on 04/09/24. No new psychosocial barriers or concerns since orientation.    Expected Outcomes For Mayjor to participate in PR free of any psychosocial barriers or concerns    Interventions Encouraged to attend Pulmonary Rehabilitation for the exercise    Continue Psychosocial Services  No Follow up required          Education: Education Goals: Education classes will be provided on a weekly basis, covering required topics. Participant will state understanding/return demonstration of topics presented.  Learning Barriers/Preferences:  Learning Barriers/Preferences - 04/01/24 1053       Learning Barriers/Preferences   Learning Barriers Language    Learning Preferences None          Education Topics: Know Your Numbers Group instruction that is supported by a PowerPoint presentation. Instructor discusses importance of knowing and understanding resting, exercise, and post-exercise oxygen  saturation, heart rate, and blood pressure. Oxygen  saturation, heart rate, blood pressure, rating of perceived exertion, and dyspnea are reviewed along with a normal range for these values.    Exercise for the Pulmonary Patient Group instruction that is supported by a PowerPoint presentation. Instructor discusses benefits of exercise, core components of exercise, frequency, duration, and intensity of an exercise routine, importance of utilizing pulse oximetry during exercise, safety while exercising, and options of places to exercise outside of rehab.    MET Level  Group instruction provided by PowerPoint, verbal discussion, and written  material to support subject matter. Instructor reviews what METs are and how to increase METs.    Pulmonary Medications Verbally interactive group education provided by instructor with focus on inhaled medications and proper administration.   Anatomy and Physiology of the Respiratory System Group instruction provided by PowerPoint, verbal discussion, and written material to support subject matter. Instructor  reviews respiratory cycle and anatomical components of the respiratory system and their functions. Instructor also reviews differences in obstructive and restrictive respiratory diseases with examples of each.    Oxygen  Safety Group instruction provided by PowerPoint, verbal discussion, and written material to support subject matter. There is an overview of "What is Oxygen " and "Why do we need it".  Instructor also reviews how to create a safe environment for oxygen  use, the importance of using oxygen  as prescribed, and the risks of noncompliance. There is a brief discussion on traveling with oxygen  and resources the patient may utilize.   Oxygen  Use Group instruction provided by PowerPoint, verbal discussion, and written material to discuss how supplemental oxygen  is prescribed and different types of oxygen  supply systems. Resources for more information are provided.    Breathing Techniques Group instruction that is supported by demonstration and informational handouts. Instructor discusses the benefits of pursed lip and diaphragmatic breathing and detailed demonstration on how to perform both.     Risk Factor Reduction Group instruction that is supported by a PowerPoint presentation. Instructor discusses the definition of a risk factor, different risk factors for pulmonary disease, and how the heart and lungs work together.   Pulmonary Diseases Group instruction provided by PowerPoint, verbal discussion, and written material to support subject matter. Instructor gives an overview of  the different type of pulmonary diseases. There is also a discussion on risk factors and symptoms as well as ways to manage the diseases.   Stress and Energy Conservation Group instruction provided by PowerPoint, verbal discussion, and written material to support subject matter. Instructor gives an overview of stress and the impact it can have on the body. Instructor also reviews ways to reduce stress. There is also a discussion on energy conservation and ways to conserve energy throughout the day.   Warning Signs and Symptoms Group instruction provided by PowerPoint, verbal discussion, and written material to support subject matter. Instructor reviews warning signs and symptoms of stroke, heart attack, cold and flu. Instructor also reviews ways to prevent the spread of infection.   Other Education Group or individual verbal, written, or video instructions that support the educational goals of the pulmonary rehab program.    Knowledge Questionnaire Score:  Knowledge Questionnaire Score - 04/01/24 1207       Knowledge Questionnaire Score   Pre Score 15/18          Core Components/Risk Factors/Patient Goals at Admission:  Personal Goals and Risk Factors at Admission - 04/01/24 1053       Core Components/Risk Factors/Patient Goals on Admission   Improve shortness of breath with ADL's Yes    Intervention Provide education, individualized exercise plan and daily activity instruction to help decrease symptoms of SOB with activities of daily living.    Expected Outcomes Short Term: Improve cardiorespiratory fitness to achieve a reduction of symptoms when performing ADLs;Long Term: Be able to perform more ADLs without symptoms or delay the onset of symptoms          Core Components/Risk Factors/Patient Goals Review:   Goals and Risk Factor Review     Row Name 04/02/24 0940             Core Components/Risk Factors/Patient Goals Review   Personal Goals Review Improve shortness  of breath with ADL's;Develop more efficient breathing techniques such as purse lipped breathing and diaphragmatic breathing and practicing self-pacing with activity.       Review Silvino is scheduled to start PR on 04/09/24. Unable to  asses goals at this time.       Expected Outcomes To improve shortness of breath with ADL's and develop more efficient breathing techniques such as purse lipped breathing and diaphragmatic breathing; and practicing self-pacing with activity.          Core Components/Risk Factors/Patient Goals at Discharge (Final Review):   Goals and Risk Factor Review - 04/02/24 0940       Core Components/Risk Factors/Patient Goals Review   Personal Goals Review Improve shortness of breath with ADL's;Develop more efficient breathing techniques such as purse lipped breathing and diaphragmatic breathing and practicing self-pacing with activity.    Review Kato is scheduled to start PR on 04/09/24. Unable to asses goals at this time.    Expected Outcomes To improve shortness of breath with ADL's and develop more efficient breathing techniques such as purse lipped breathing and diaphragmatic breathing; and practicing self-pacing with activity.          ITP Comments: Pt is making expected progress toward Pulmonary Rehab goals after completing 0 session(s). Recommend continued exercise, life style modification, education, and utilization of breathing techniques to increase stamina and strength, while also decreasing shortness of breath with exertion.  Dr. Slater Staff is Medical Director for Pulmonary Rehab at [facility].

## 2024-04-09 ENCOUNTER — Encounter (HOSPITAL_COMMUNITY)
Admission: RE | Admit: 2024-04-09 | Discharge: 2024-04-09 | Disposition: A | Discharge status: Home / Self Care | Source: Ambulatory Visit | Attending: Pulmonary Disease | Admitting: Pulmonary Disease

## 2024-04-09 DIAGNOSIS — J449 Chronic obstructive pulmonary disease, unspecified: Secondary | ICD-10-CM | POA: Insufficient documentation

## 2024-04-09 LAB — GLUCOSE, CAPILLARY
Glucose-Capillary: 266 mg/dL — ABNORMAL HIGH (ref 70–99)
Glucose-Capillary: 137 mg/dL — ABNORMAL HIGH (ref 70–99)

## 2024-04-09 NOTE — Progress Notes (Signed)
 Daily Session Note  Patient Details  Name: Duane Cuevas MRN: [id number] Date of Birth: February 02, 1956 Referring Provider:   [name] Pulmonary Rehab Walk Test from 04/01/2024 in [facility]  Referring Provider Tamea    Encounter Date: 04/09/2024  Check In:  Session Check In - 04/09/24 1127       Check-In   Supervising physician immediately available to respond to emergencies [facility] MD immediately available    Physician(s) Barnie Hila, NP    Location [facility]    Staff Present Ronal Levin, RN, Maud Moats, MS, ACSM-CEP, Exercise Physiologist;Casey Claudene Adell Music, RN, BSN    Virtual Visit No    Medication changes reported     No    Fall or balance concerns reported    No    Tobacco Cessation No Change    Warm-up and Cool-down Performed as group-led instruction    Resistance Training Performed Yes    VAD Patient? No    PAD/SET Patient? No      Pain Assessment   Currently in Pain? No/denies    Multiple Pain Sites No          Capillary Blood Glucose: Results for orders placed or performed during the hospital encounter of 04/09/24 (from the past 24 hours)  Glucose, capillary     Status: Abnormal   Collection Time: 04/09/24 10:18 AM  Result Value Ref Range   Glucose-Capillary 266 (H) 70 - 99 mg/dL  Glucose, capillary     Status: Abnormal   Collection Time: 04/09/24 11:42 AM  Result Value Ref Range   Glucose-Capillary 137 (H) 70 - 99 mg/dL      Social History   Tobacco Use  Smoking Status Every Day   Current packs/day: 1.00   Average packs/day: 1 pack/day for 53.8 years (53.8 ttl pk-yrs)   Types: Cigarettes   Start date: 71  Smokeless Tobacco Never  Tobacco Comments   02-27-2023  per pt daughter pt is still smoking, but hides how much per day,  smoking since age 89 (65)    Goals Met:  Proper associated with RPD/PD & O2 Sat Exercise tolerated well No  report of concerns or symptoms today Strength training completed today  Goals Unmet:  Not Applicable  Comments: Service time is from 1013 to 1139.    Dr. Slater Staff is Medical Director for Pulmonary Rehab at [facility].

## 2024-04-11 ENCOUNTER — Encounter (HOSPITAL_COMMUNITY)
Admission: RE | Admit: 2024-04-11 | Discharge: 2024-04-11 | Disposition: A | Discharge status: Home / Self Care | Source: Ambulatory Visit | Attending: Pulmonary Disease | Admitting: Pulmonary Disease

## 2024-04-11 DIAGNOSIS — J449 Chronic obstructive pulmonary disease, unspecified: Secondary | ICD-10-CM | POA: Diagnosis not present

## 2024-04-11 LAB — GLUCOSE, CAPILLARY
Glucose-Capillary: 192 mg/dL — ABNORMAL HIGH (ref 70–99)
Glucose-Capillary: 149 mg/dL — ABNORMAL HIGH (ref 70–99)

## 2024-04-11 NOTE — Progress Notes (Signed)
 Daily Session Note  Patient Details  Name: Duane Cuevas MRN: [id number] Date of Birth: May 22, 1956 Referring Provider:   [name] Pulmonary Rehab Walk Test from 04/01/2024 in [facility], [facility], [facility]  Referring Provider Tamea    Encounter Date: 04/11/2024  Check In:  Session Check In - 04/11/24 1040       Check-In   Supervising physician immediately available to respond to emergencies [facility] immediately available    Physician(s) Josefa Beauvais, NP    [facility]    Staff Present Ronal Levin, RN, Maud Moats, MS, ACSM-CEP, Exercise Physiologist;Tylor Gambrill Claudene Idelia Aris BS, ACSM-CEP, Exercise Physiologist    Virtual Visit No    Medication changes reported     No    Fall or balance concerns reported    No    Tobacco Cessation No Change    Warm-up and Cool-down Performed as group-led instruction    Resistance Training Performed Yes    VAD Patient? No    PAD/SET Patient? No      Pain Assessment   Currently in Pain? No/denies          Capillary Blood Glucose: Results for orders placed or performed during the hospital encounter of 04/11/24 (from the past 24 hours)  Glucose, capillary     Status: Abnormal   Collection Time: 04/11/24 11:50 AM  Result Value Ref Range   Glucose-Capillary 149 (H) 70 - 99 mg/dL      Social History   Tobacco Use  Smoking Status Every Day   Current packs/day: 1.00   Average packs/day: 1 pack/day for 53.8 years (53.8 ttl pk-yrs)   Types: Cigarettes   Start date: 72  Smokeless Tobacco Never  Tobacco Comments   02-27-2023  per pt daughter pt is still smoking, but hides how much per day,  smoking since age 67 (4)    Goals Met:  Proper associated with RPD/PD & O2 Sat Independence with exercise equipment Exercise tolerated well No report of concerns or symptoms today Strength training completed today  Goals Unmet:  Not Applicable  Comments:  Service time is from 1013 to 1149.    Dr. Slater Staff is Medical Director for Pulmonary Rehab at [facility].

## 2024-04-15 DIAGNOSIS — J441 Chronic obstructive pulmonary disease with (acute) exacerbation: Secondary | ICD-10-CM | POA: Diagnosis not present

## 2024-04-16 ENCOUNTER — Encounter (HOSPITAL_COMMUNITY)
Admission: RE | Admit: 2024-04-16 | Discharge: 2024-04-16 | Disposition: A | Source: Ambulatory Visit | Attending: Pulmonary Disease

## 2024-04-16 VITALS — Wt 164.9 lb

## 2024-04-16 DIAGNOSIS — J449 Chronic obstructive pulmonary disease, unspecified: Secondary | ICD-10-CM | POA: Diagnosis not present

## 2024-04-16 NOTE — Progress Notes (Signed)
 Daily Session Note  Patient Details  Name: Duane Cuevas MRN: [id number] Date of Birth: Mar 15, 1956 Referring Provider:   [name] Pulmonary Rehab Walk Test from 04/01/2024 in [facility], Vascular, & [facility]  Referring Provider Tamea    Encounter Date: 04/16/2024  Check In:  Session Check In - 04/16/24 1121       Check-In   Supervising physician immediately available to respond to emergencies [facility] immediately available    Physician(s) Orren Fabry, NP    [facility]    Staff Present Ronal Levin, RN, Maud Moats, MS, ACSM-CEP, Exercise Physiologist;Casey Claudene, RT;Randi [place]idge BS, ACSM-CEP, Exercise Physiologist;Johnny [place], MS, Exercise Physiologist    Virtual Visit No    Medication changes reported     No    Fall or balance concerns reported    No    Tobacco Cessation No Change    Warm-up and Cool-down Performed as group-led instruction    Resistance Training Performed Yes    VAD Patient? No    PAD/SET Patient? No      Pain Assessment   Currently in Pain? No/denies    Multiple Pain Sites No          Capillary Blood Glucose: No results found for this or any previous visit (from the past 24 hours).   Exercise Prescription Changes - 04/16/24 1200       Response to Exercise   Blood Pressure (Admit) 100/64    Blood Pressure (Exercise) 132/70    Blood Pressure (Exit) 112/66    Heart Rate (Admit) 97 bpm    Heart Rate (Exercise) 122 bpm    Heart Rate (Exit) 104 bpm    Oxygen  Saturation (Admit) 95 %    Oxygen  Saturation (Exercise) 93 %    Oxygen  Saturation (Exit) 94 %    Rating of Perceived Exertion (Exercise) 11    Perceived Dyspnea (Exercise) 1    Duration Continue with 30 min of aerobic exercise without signs/symptoms of physical distress.    Intensity THRR unchanged      Progression   Progression Continue to progress workloads to maintain intensity without signs/symptoms of  physical distress.      Resistance Training   Training Prescription Yes    Weight blue bands    Reps 10-15    Time 10 Minutes      Treadmill   MPH 2    Grade 0    Minutes 15    METs 2.4      Recumbant Elliptical   Level 2    Minutes 15    METs 4          Social History   Tobacco Use  Smoking Status Every Day   Current packs/day: 1.00   Average packs/day: 1 pack/day for 53.8 years (53.8 ttl pk-yrs)   Types: Cigarettes   Start date: 75  Smokeless Tobacco Never  Tobacco Comments   02-27-2023  per pt daughter pt is still smoking, but hides how much per day,  smoking since age 64 (8)    Goals Met:  Proper associated with RPD/PD & O2 Sat Exercise tolerated well No report of concerns or symptoms today Strength training completed today  Goals Unmet:  Not Applicable  Comments: Service time is from 1019 to 1137.    Dr. Slater Staff is Medical Director for Pulmonary Rehab at [facility].

## 2024-04-17 LAB — GLUCOSE, CAPILLARY: Glucose-Capillary: 216 mg/dL — ABNORMAL HIGH (ref 70–99)

## 2024-04-18 ENCOUNTER — Encounter (HOSPITAL_COMMUNITY)
Admission: RE | Admit: 2024-04-18 | Discharge: 2024-04-18 | Disposition: A | Discharge status: Home / Self Care | Source: Ambulatory Visit | Attending: Pulmonary Disease | Admitting: Pulmonary Disease

## 2024-04-18 DIAGNOSIS — J449 Chronic obstructive pulmonary disease, unspecified: Secondary | ICD-10-CM

## 2024-04-18 LAB — GLUCOSE, CAPILLARY
Glucose-Capillary: 149 mg/dL — ABNORMAL HIGH (ref 70–99)
Glucose-Capillary: 117 mg/dL — ABNORMAL HIGH (ref 70–99)

## 2024-04-18 NOTE — Progress Notes (Signed)
 Daily Session Note  Patient Details  Name: Climmie Buelow MRN: [id number] Date of Birth: 06/07/1956 Referring Provider:   [name] Pulmonary Rehab Walk Test from 04/01/2024 in [facility] for [facility], Vascular, & Lung [facility]  Referring Provider Tamea    Encounter Date: 04/18/2024  Check In:  Session Check In - 04/18/24 1038       Check-In   Supervising physician immediately available to respond to emergencies [facility] immediately available    Physician(s) Rosabel Mose, NP    [facility]    Staff Present Ronal Levin, RN, Maud Moats, MS, ACSM-CEP, Exercise Physiologist;Casey Claudene, RT;Randi [place] BS, ACSM-CEP, Exercise Physiologist    Virtual Visit No    Medication changes reported     No    Fall or balance concerns reported    No    Tobacco Cessation No Change    Warm-up and Cool-down Performed as group-led instruction    Resistance Training Performed Yes    VAD Patient? No    PAD/SET Patient? No      Pain Assessment   Currently in Pain? No/denies    Multiple Pain Sites No          Capillary Blood Glucose: Results for orders placed or performed during the hospital encounter of 04/18/24 (from the past 24 hours)  Glucose, capillary     Status: Abnormal   Collection Time: 04/18/24 10:22 AM  Result Value Ref Range   Glucose-Capillary 149 (H) 70 - 99 mg/dL  Glucose, capillary     Status: Abnormal   Collection Time: 04/18/24 11:51 AM  Result Value Ref Range   Glucose-Capillary 117 (H) 70 - 99 mg/dL      Social History   Tobacco Use  Smoking Status Every Day   Current packs/day: 1.00   Average packs/day: 1 pack/day for 53.8 years (53.8 ttl pk-yrs)   Types: Cigarettes   Start date: 27  Smokeless Tobacco Never  Tobacco Comments   02-27-2023  per pt daughter pt is still smoking, but hides how much per day,  smoking since age 22 (50)    Goals Met:  Exercise tolerated well No report of concerns or  symptoms today Strength training completed today  Goals Unmet:  Not Applicable  Comments: Service time is from 1018 to 1148    Dr. Slater Staff is Medical Director for Pulmonary Rehab at [facility].

## 2024-04-23 ENCOUNTER — Encounter (HOSPITAL_COMMUNITY)
Admission: RE | Admit: 2024-04-23 | Discharge: 2024-04-23 | Disposition: A | Discharge status: Home / Self Care | Source: Ambulatory Visit | Attending: Pulmonary Disease | Admitting: Pulmonary Disease

## 2024-04-23 DIAGNOSIS — J449 Chronic obstructive pulmonary disease, unspecified: Secondary | ICD-10-CM | POA: Diagnosis not present

## 2024-04-23 NOTE — Progress Notes (Signed)
 Daily Session Note  Patient Details  Name: Duane Cuevas MRN: [id number] Date of Birth: 01-07-1956 Referring Provider:   [name] Pulmonary Rehab Walk Test from 04/01/2024 in [facility], Vascular, & Lung [facility]  Referring Provider Tamea    Encounter Date: 04/23/2024  Check In:  Session Check In - 04/23/24 1155       Check-In   Supervising physician immediately available to respond to emergencies [facility] immediately available    Physician(s) Rosabel Mose, NP    [facility]    Staff Present Ronal Levin, RN, BSN;Milaya Hora Claudene, RT;Randi [place] BS, ACSM-CEP, Exercise Physiologist;Olinty [place], MS, ACSM-CEP, Exercise Physiologist    Virtual Visit No    Medication changes reported     No    Fall or balance concerns reported    No    Tobacco Cessation No Change    Warm-up and Cool-down Performed as group-led instruction    Resistance Training Performed Yes    VAD Patient? No    PAD/SET Patient? No      Pain Assessment   Currently in Pain? No/denies    Multiple Pain Sites No          Capillary Blood Glucose: No results found for this or any previous visit (from the past 24 hours).    Social History   Tobacco Use  Smoking Status Every Day   Current packs/day: 1.00   Average packs/day: 1 pack/day for 53.8 years (53.8 ttl pk-yrs)   Types: Cigarettes   Start date: 7  Smokeless Tobacco Never  Tobacco Comments   02-27-2023  per pt daughter pt is still smoking, but hides how much per day,  smoking since age 49 (16)    Goals Met:  Proper associated with RPD/PD & O2 Sat Independence with exercise equipment Exercise tolerated well No report of concerns or symptoms today Strength training completed today  Goals Unmet:  Not Applicable  Comments: Service time is from 1012 to 1144.    Dr. Slater Staff is Medical Director for Pulmonary Rehab at [facility].

## 2024-04-25 ENCOUNTER — Encounter (HOSPITAL_COMMUNITY)
Admission: RE | Admit: 2024-04-25 | Discharge: 2024-04-25 | Disposition: A | Discharge status: Home / Self Care | Source: Ambulatory Visit | Attending: Pulmonary Disease | Admitting: Pulmonary Disease

## 2024-04-25 DIAGNOSIS — J449 Chronic obstructive pulmonary disease, unspecified: Secondary | ICD-10-CM

## 2024-04-25 NOTE — Progress Notes (Signed)
 Daily Session Note  Patient Details  Name: Duane Cuevas MRN: [id number] Date of Birth: Oct 07, 1955 Referring Provider:   [name] Pulmonary Rehab Walk Test from 04/01/2024 in [facility], Vascular, & Lung [facility]  Referring Provider Tamea    Encounter Date: 04/25/2024  Check In:  Session Check In - 04/25/24 1205       Check-In   Supervising physician immediately available to respond to emergencies [facility] immediately available    Physician(s) Damien Braver, NP    [facility]    Staff Present Ronal Levin, RN, BSN;Casey Claudene, RT;Randi [facility] BS, ACSM-CEP, Exercise Physiologist;[place] Nicholaus, MS, ACSM-CEP, Exercise Physiologist    Virtual Visit No    Medication changes reported     No    Fall or balance concerns reported    No    Tobacco Cessation No Change    Warm-up and Cool-down Performed as group-led instruction    Resistance Training Performed Yes    VAD Patient? No    PAD/SET Patient? No      Pain Assessment   Currently in Pain? No/denies    Multiple Pain Sites No          Capillary Blood Glucose: No results found for this or any previous visit (from the past 24 hours).    Social History   Tobacco Use  Smoking Status Every Day   Current packs/day: 1.00   Average packs/day: 1 pack/day for 68 years (53.8 ttl pk-yrs)   Types: Cigarettes   Start date: 34  Smokeless Tobacco Never  Tobacco Comments   02-27-2023  per pt daughter pt is still smoking, but hides how much per day,  smoking since age 68 (34)    Goals Met:  Proper associated with RPD/PD & O2 Sat Exercise tolerated well No report of concerns or symptoms today Strength training completed today  Goals Unmet:  Not Applicable  Comments: Service time is from 1049 to 1145.    Dr. Slater Staff is Medical Director for Pulmonary Rehab at [facility].

## 2024-04-30 ENCOUNTER — Encounter (HOSPITAL_COMMUNITY)
Admission: RE | Admit: 2024-04-30 | Discharge: 2024-04-30 | Disposition: A | Source: Ambulatory Visit | Attending: Pulmonary Disease

## 2024-04-30 ENCOUNTER — Other Ambulatory Visit: Payer: Self-pay | Admitting: Emergency Medicine

## 2024-04-30 VITALS — Wt 167.3 lb

## 2024-04-30 DIAGNOSIS — J449 Chronic obstructive pulmonary disease, unspecified: Secondary | ICD-10-CM | POA: Diagnosis not present

## 2024-04-30 NOTE — Progress Notes (Signed)
 Daily Session Note  Patient Details  Name: Duane Cuevas MRN: [id number] Date of Birth: 03-Jan-1956 Referring Provider:   [name] Pulmonary Rehab Walk Test from 04/01/2024 in [facility] for Heart, Vascular, & Lung Health  Referring Provider Tamea    Encounter Date: 04/30/2024  Check In:  Session Check In - 04/30/24 1109       Check-In   Supervising physician immediately available to respond to emergencies [facility] immediately available    Physician(s) Lum Louis, NP    [facility]    Staff Present Ronal Levin, RN, BSN;Casey Claudene, RT;Randi [place] BS, ACSM-CEP, Exercise Physiologist;Callin [place], MS, ACSM-CEP, Exercise Physiologist    Virtual Visit No    Medication changes reported     No    Fall or balance concerns reported    No    Tobacco Cessation No Change    Warm-up and Cool-down Performed as group-led instruction    Resistance Training Performed Yes    VAD Patient? No    PAD/SET Patient? No      Pain Assessment   Currently in Pain? No/denies    Multiple Pain Sites No          Capillary Blood Glucose: No results found for this or any previous visit (from the past 24 hours).   Exercise Prescription Changes - 04/30/24 1200       Response to Exercise   Blood Pressure (Admit) 116/60    Blood Pressure (Exercise) 154/84    Blood Pressure (Exit) 118/68    Heart Rate (Admit) 97 bpm    Heart Rate (Exercise) 132 bpm    Heart Rate (Exit) 104 bpm    Oxygen  Saturation (Admit) 97 %    Oxygen  Saturation (Exercise) 92 %    Oxygen  Saturation (Exit) 95 %    Rating of Perceived Exertion (Exercise) 11    Perceived Dyspnea (Exercise) 1    Duration Continue with 30 min of aerobic exercise without signs/symptoms of physical distress.    Intensity THRR unchanged      Progression   Progression Continue to progress workloads to maintain intensity without signs/symptoms of physical distress.      Resistance  Training   Training Prescription Yes    Weight blue bands    Reps 10-15    Time 10 Minutes      Treadmill   MPH 2.3    Grade 2    Minutes 15    METs 3.2      Recumbant Elliptical   Level 3    Minutes 15    METs 4.3          Social History   Tobacco Use  Smoking Status Every Day   Current packs/day: 1.00   Average packs/day: 1 pack/day for 53.8 years (53.8 ttl pk-yrs)   Types: Cigarettes   Start date: 39  Smokeless Tobacco Never  Tobacco Comments   02-27-2023  per pt daughter pt is still smoking, but hides how much per day,  smoking since age 44 (25)    Goals Met:  Proper associated with RPD/PD & O2 Sat Independence with exercise equipment Exercise tolerated well No report of concerns or symptoms today Strength training completed today  Goals Unmet:  Not Applicable  Comments: Service time is from 1018 to 1141.    Dr. Slater Staff is Medical Director for Pulmonary Rehab at [facility].

## 2024-05-01 NOTE — Progress Notes (Signed)
 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 TOMAR 1 TABLETA POR VAI ORAL UNA VEZ AL DIA, Disp: 90 tablet, Rfl: 3   aspirin  EC 81 MG tablet, Take 1 tablet (81 mg total) by mouth daily. Swallow whole., Disp: 30 tablet, Rfl: 0   atorvastatin  (LIPITOR) 20 MG tablet, Take 1 tablet (20 mg total) by mouth daily. TOMAR 1 TABLETA POR VIA ORAL UNA VEZ AL DIA, Disp: 90 tablet, Rfl: 3   budesonide -glycopyrrolate-formoterol  (BREZTRI  AEROSPHERE) 160-9-4.8 MCG/ACT AERO inhaler, INHALE TWO PUFFS BY MOUTH TWICE A DAY, Disp: 10.7 g, Rfl: 10   Continuous Glucose Sensor (FREESTYLE LIBRE 14 DAY SENSOR) MISC, Continuous blood glucose monitoring as needed daily, Disp: 1 each, Rfl: 7   cyclobenzaprine  (FLEXERIL ) 10 MG tablet, Take 1 tablet (10 mg total) by mouth 2 (two) times daily as needed for muscle spasms., Disp: 20 tablet, Rfl: 0   Dulaglutide  (TRULICITY ) 1.5 MG/0.5ML SOAJ, INYECTAR CONTENIDOS DE UN LAPIZ POR VIA SUBCUTANEA  CADA SEMANA, EL MISMO DIA CADA SEMANA, Disp: 2 mL, Rfl: 10   FARXIGA  10 MG TABS tablet, TOMAR 1 TABLETA POR VIA ORAL UNA VEZ AL DIA, Disp: 90 tablet, Rfl: 3   gabapentin  (NEURONTIN ) 300 MG capsule, Take 1 capsule (300 mg total) by mouth at bedtime as needed., Disp: 60 capsule, Rfl: 0   glipiZIDE  (GLUCOTROL ) 10 MG tablet, TOMAR 1 TABLETA POR VIA ORAL UNA VEZ AL DIA ANTES DEL DESAYUNO *TOMAR 1 TABLETA ADICIONAL SI AZUCAR EN LA SANGRE ESTA ALTA*, Disp: 90 tablet, Rfl: 3   glucose blood test strip, Use as instructed, Disp: 100 each, Rfl: 12   guaifenesin  (ROBITUSSIN) 100 MG/5ML syrup, Take 200 mg by mouth 3 (three) times daily as needed for cough., Disp: , Rfl:    ibuprofen  (ADVIL ) 800 MG tablet, Take 1 tablet (800 mg total) by mouth every 8 (eight) hours as needed., Disp: 30 tablet, Rfl: 0   insulin  aspart (NOVOLOG  FLEXPEN) 100 UNIT/ML FlexPen, INYECTAR 3 UNIDADES POR VIA SUBCUTANEA TRES VECES AL DIA COMO INDICADO. AJUSTAR CANTIDAD DE INSULINA POR ESCALA MOVIL. MAS DOSIS 30 UNIDADES, Disp: 15 mL, Rfl: 10   mupirocin  ointment (BACTROBAN ) 2 %, Apply 1 Application topically daily. 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  Follow discharge instructions on paperwork for duration (Patient not taking: Reported on 04/01/2024), Disp: 22 g, Rfl: 0   pantoprazole  (PROTONIX ) 40 MG tablet, Take 1 tablet (40 mg total) by mouth daily. TOMAR 1 TABLETA POR VIA ORAL UNA VEZ AL DIA, Disp: 90 tablet, Rfl: 3  predniSONE  (DELTASONE ) 10 MG tablet, Take 4 tabs daily for 3 days, then 3 tabs daily for 3 days, then 2 tabs daily for 3 days, then 1 tab daily for 3 days, then 1/2 tab daily for 4 days. (Patient not taking: Reported on 04/01/2024), Disp: 32 tablet, Rfl: 0   sildenafil  (VIAGRA ) 100 MG tablet, Take 0.5-1 tablets (50-100 mg total) by mouth daily as needed for erectile dysfunction. (Patient not taking: Reported on 04/01/2024), Disp: 5 tablet, Rfl: 11   TRESIBA  FLEXTOUCH 100 UNIT/ML FlexTouch Pen, INYECTAR 25  UNIDADES POR VIA SUBCUTANEA DIARIAMENTE, Disp: 15 mL, Rfl: 10 No current facility-administered medications for this encounter.  Facility-Administered Medications Ordered in Other Encounters:    morphine  2 MG/ML injection, , , ,   Past Medical History: Past Medical History:  Diagnosis Date   Cerebral aneurysm, nonruptured    followed by dr n. 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   Coronary artery disease    (pt admitted for chest pain ) nuclear stress test 11-02-2019  low risk, nuclear ef 58%,  prior inferior defect of severe severity indicative of previous MI;;   echo 04-28-2020 ef 60-65%, mild concentric LVH, RSVP 54.75mmHg;;   cardiac CTA 09-15-2020  unremarkable,  calcium  score=zero   Dependence on nocturnal oxygen  therapy    02-27-2023  per pt daughter,  pt only uses at night w/ O2 sat ,2-3L via [place],  only uses portable if going outside when every hot which is not much   Diverticulosis of colon    DOE (dyspnea on exertion)    02-27-2023  per pt daugter sob w/ stairs, incline's,  ok w/ some yard work if not warm/ hot wearther,  can do house hold chores   ED (erectile dysfunction)    Emphysema/COPD    followed by pcp;   uses breztri  bid, uses preventil nebulizer every afternoon;   last exacerbation 06/ 2023 in epic   Full dentures    GERD (gastroesophageal reflux disease)    Hepatic steatosis    History of adenomatous polyp of colon    History of gastric ulcer    per EGD 09-16-2020  non-bleeding   History of MI (myocardial infarction)    per nuclear stress test in epic 11-02-2019 showed prior inferior defect of severe severity indicative of previous MI   History of pelvic fracture    1999  and 2009   Hyperlipidemia    Hypertension    Smokers' cough (HCC)    Type 2 diabetes mellitus treated with insulin  (HCC)    followed by pcp;  dx 2016   (02-27-2023  per pt daughter pt   Wears glasses      Tobacco Use: Social History   Tobacco Use  Smoking Status Every Day   Current packs/day: 1.00   Average packs/day: 1 pack/day for 53.8 years (53.8 ttl pk-yrs)   Types: Cigarettes   Start date: 18  Smokeless Tobacco Never  Tobacco Comments   02-27-2023  per pt daughter pt is still smoking, but hides how much per day,  smoking since age 77 (83)    Labs: Review Flowsheet  More data exists      Latest Ref Rng & Units 12/29/2022 03/09/2023 08/02/2023 12/14/2023 02/14/2024  Labs for ITP Cardiac and Pulmonary Rehab  Cholestrol 0 - 200 mg/dL 848  - - - 873   LDL (calc) 0 - 99 mg/dL 80  - - - 63  HDL-C >39.00 mg/dL 42.39  - - - 56.29   Trlycerides 0.0 - 149.0 mg/dL 29.9  - - - 07.9   Hemoglobin A1c 4.8 - 5.6 % 6.6  7.5  6.7  7.2  -    Capillary Blood Glucose: Lab Results  Component Value Date   GLUCAP 117 (H) 04/18/2024   GLUCAP 149 (H) 04/18/2024   GLUCAP 216 (H) 04/16/2024   GLUCAP 149 (H) 04/11/2024   GLUCAP 192 (H) 04/11/2024     Pulmonary Assessment Scores:  Pulmonary Assessment Scores     Row Name 04/01/24 1109         ADL UCSD   ADL Phase Entry     SOB Score total 88       CAT Score   CAT Score 22       mMRC Score   mMRC Score 4       UCSD: Self-administered rating of dyspnea associated with activities of daily living (ADLs) 6-point scale (0 = not at all to 5 = maximal or unable to do because of breathlessness)  Scoring Scores range from 0 to 120.  Minimally important difference is 5 units  CAT: CAT can identify the health impairment of COPD patients and is better correlated with disease progression.  CAT has a scoring range of zero to 40. The CAT score is classified into four groups of low (less than 10), medium (10 - 20), high (21-30) and very high (31-40) based on the impact level of disease on health status. A CAT score over 10 suggests significant symptoms.  A worsening CAT score could be explained by an exacerbation, poor medication  adherence, poor inhaler technique, or progression of COPD or comorbid conditions.  CAT MCID is 2 points  mMRC: mMRC (Modified Medical Research Council) Dyspnea Scale is used to assess the degree of baseline functional disability in patients of respiratory disease due to dyspnea. No minimal important difference is established. A decrease in score of 1 point or greater is considered a positive change.   Pulmonary Function Assessment:  Pulmonary Function Assessment - 04/01/24 1109       Breath   Bilateral Breath Sounds Decreased;Wheezes    Shortness of Breath Yes;Limiting activity;Fear of Shortness of Breath;Panic with Shortness of Breath          Exercise Target Goals: Exercise Program Goal: Individual exercise prescription set using results from initial 6 min walk test and THRR while considering  patient's activity barriers and safety.   Exercise Prescription Goal: Initial exercise prescription builds to 30-45 minutes a day of aerobic activity, 2-3 days per week.  Home exercise guidelines will be given to patient during program as part of exercise prescription that the participant will acknowledge.  Activity Barriers & Risk Stratification:  Activity Barriers & Cardiac Risk Stratification - 04/01/24 1112       Activity Barriers & Cardiac Risk Stratification   Activity Barriers Deconditioning;Muscular Weakness;Shortness of Breath;History of Falls;Back Problems          6 Minute Walk:  6 Minute Walk     Row Name 04/01/24 1202         6 Minute Walk   Phase Initial     Distance 1125 feet     Walk Time 6 minutes     # of Rest Breaks 0     MPH 2.13     METS 2.92     RPE 13     Perceived Dyspnea  3     VO2  Peak 10.22     Symptoms No     Resting HR 86 bpm     Resting BP 112/60     Resting Oxygen  Saturation  95 %     Exercise Oxygen  Saturation  during 6 min walk 90 %     Max Ex. HR 111 bpm     Max Ex. BP 126/64     2 Minute Post BP 108/60       Interval HR   1  Minute HR 107     2 Minute HR 104     3 Minute HR 101     4 Minute HR 103     5 Minute HR 111     6 Minute HR 108     2 Minute Post HR 90     Interval Heart Rate? Yes       Interval Oxygen    Interval Oxygen ? Yes     Baseline Oxygen  Saturation % 95 %     1 Minute Oxygen  Saturation % 94 %     1 Minute Liters of Oxygen  0 L     2 Minute Oxygen  Saturation % 93 %     2 Minute Liters of Oxygen  0 L     3 Minute Oxygen  Saturation % 92 %     3 Minute Liters of Oxygen  0 L     4 Minute Oxygen  Saturation % 91 %     4 Minute Liters of Oxygen  0 L     5 Minute Oxygen  Saturation % 91 %     5 Minute Liters of Oxygen  0 L     6 Minute Oxygen  Saturation % 90 %     6 Minute Liters of Oxygen  0 L     2 Minute Post Oxygen  Saturation % 97 %     2 Minute Post Liters of Oxygen  0 L        Oxygen  Initial Assessment:  Oxygen  Initial Assessment - 04/01/24 1051       Home Oxygen    Home Oxygen  Device Home Concentrator    Sleep Oxygen  Prescription Continuous    Liters per minute 2.5    Home Exercise Oxygen  Prescription None    Home Resting Oxygen  Prescription None    Compliance with Home Oxygen  Use Yes      Initial 6 min Walk   Oxygen  Used None      Program Oxygen  Prescription   Program Oxygen  Prescription None      Intervention   Short Term Goals To learn and exhibit compliance with exercise, home and travel O2 prescription;To learn and understand importance of maintaining oxygen  saturations>88%;To learn and demonstrate proper use of respiratory medications;To learn and understand importance of monitoring SPO2 with pulse oximeter and demonstrate accurate use of the pulse oximeter.;To learn and demonstrate proper pursed lip breathing techniques or other breathing techniques.     Long  Term Goals Exhibits compliance with exercise, home  and travel O2 prescription;Maintenance of O2 saturations>88%;Compliance with respiratory medication;Verbalizes importance of monitoring SPO2 with pulse oximeter and  return demonstration;Exhibits proper breathing techniques, such as pursed lip breathing or other method taught during program session;Demonstrates proper use of MDI's          Oxygen  Re-Evaluation:  Oxygen  Re-Evaluation     Row Name 04/02/24 0759 04/19/24 0940           Program Oxygen  Prescription   Program Oxygen  Prescription None None        Home Oxygen   Home Oxygen  Device Home Concentrator Home Concentrator      Sleep Oxygen  Prescription Continuous Continuous      Liters per minute 2.5 2.5      Home Exercise Oxygen  Prescription None None      Home Resting Oxygen  Prescription None None      Compliance with Home Oxygen  Use Yes Yes        Goals/Expected Outcomes   Short Term Goals To learn and exhibit compliance with exercise, home and travel O2 prescription;To learn and understand importance of maintaining oxygen  saturations>88%;To learn and demonstrate proper use of respiratory medications;To learn and understand importance of monitoring SPO2 with pulse oximeter and demonstrate accurate use of the pulse oximeter.;To learn and demonstrate proper pursed lip breathing techniques or other breathing techniques.  To learn and exhibit compliance with exercise, home and travel O2 prescription;To learn and understand importance of maintaining oxygen  saturations>88%;To learn and demonstrate proper use of respiratory medications;To learn and understand importance of monitoring SPO2 with pulse oximeter and demonstrate accurate use of the pulse oximeter.;To learn and demonstrate proper pursed lip breathing techniques or other breathing techniques.       Long  Term Goals Exhibits compliance with exercise, home  and travel O2 prescription;Maintenance of O2 saturations>88%;Compliance with respiratory medication;Verbalizes importance of monitoring SPO2 with pulse oximeter and return demonstration;Exhibits proper breathing techniques, such as pursed lip breathing or other method taught during program  session;Demonstrates proper use of MDI's Exhibits compliance with exercise, home  and travel O2 prescription;Maintenance of O2 saturations>88%;Compliance with respiratory medication;Verbalizes importance of monitoring SPO2 with pulse oximeter and return demonstration;Exhibits proper breathing techniques, such as pursed lip breathing or other method taught during program session;Demonstrates proper use of MDI's      Goals/Expected Outcomes Compliance and understanding of oxygen  saturation monitoring and breathing techniques to decrease shortness of breath. Compliance and understanding of oxygen  saturation monitoring and breathing techniques to decrease shortness of breath.         Oxygen  Discharge (Final Oxygen  Re-Evaluation):  Oxygen  Re-Evaluation - 04/19/24 0940       Program Oxygen  Prescription   Program Oxygen  Prescription None      Home Oxygen    Home Oxygen  Device Home Concentrator    Sleep Oxygen  Prescription Continuous    Liters per minute 2.5    Home Exercise Oxygen  Prescription None    Home Resting Oxygen  Prescription None    Compliance with Home Oxygen  Use Yes      Goals/Expected Outcomes   Short Term Goals To learn and exhibit compliance with exercise, home and travel O2 prescription;To learn and understand importance of maintaining oxygen  saturations>88%;To learn and demonstrate proper use of respiratory medications;To learn and understand importance of monitoring SPO2 with pulse oximeter and demonstrate accurate use of the pulse oximeter.;To learn and demonstrate proper pursed lip breathing techniques or other breathing techniques.     Long  Term Goals Exhibits compliance with exercise, home  and travel O2 prescription;Maintenance of O2 saturations>88%;Compliance with respiratory medication;Verbalizes importance of monitoring SPO2 with pulse oximeter and return demonstration;Exhibits proper breathing techniques, such as pursed lip breathing or other method taught during program  session;Demonstrates proper use of MDI's    Goals/Expected Outcomes Compliance and understanding of oxygen  saturation monitoring and breathing techniques to decrease shortness of breath.          Initial Exercise Prescription:  Initial Exercise Prescription - 04/01/24 1200       Date of Initial Exercise RX and Referring Provider   Date 04/01/24  Referring Provider Tamea    Expected Discharge Date 07/09/24      Treadmill   MPH 1.4    Grade 0    Minutes 15      Recumbant Elliptical   Level 1    RPM 60    Watts 80    Minutes 15    METs 2      Prescription Details   Frequency (times per week) 2    Duration Progress to 30 minutes of continuous aerobic without signs/symptoms of physical distress      Intensity   THRR 40-80% of Max Heartrate 61-122    Ratings of Perceived Exertion 11-13    Perceived Dyspnea 0-4      Progression   Progression Continue to progress workloads to maintain intensity without signs/symptoms of physical distress.      Resistance Training   Training Prescription Yes    Weight blue bands    Reps 10-15          Perform Capillary Blood Glucose checks as needed.  Exercise Prescription Changes:   Exercise Prescription Changes     Row Name 04/16/24 1200 04/30/24 1200           Response to Exercise   Blood Pressure (Admit) 100/64 116/60      Blood Pressure (Exercise) 132/70 154/84      Blood Pressure (Exit) 112/66 118/68      Heart Rate (Admit) 97 bpm 97 bpm      Heart Rate (Exercise) 122 bpm 132 bpm      Heart Rate (Exit) 104 bpm 104 bpm      Oxygen  Saturation (Admit) 95 % 97 %      Oxygen  Saturation (Exercise) 93 % 92 %      Oxygen  Saturation (Exit) 94 % 95 %      Rating of Perceived Exertion (Exercise) 11 11      Perceived Dyspnea (Exercise) 1 1      Duration Continue with 30 min of aerobic exercise without signs/symptoms of physical distress. Continue with 30 min of aerobic exercise without signs/symptoms of physical distress.       Intensity THRR unchanged THRR unchanged        Progression   Progression Continue to progress workloads to maintain intensity without signs/symptoms of physical distress. Continue to progress workloads to maintain intensity without signs/symptoms of physical distress.        Resistance Training   Training Prescription Yes Yes      Weight blue bands blue bands      Reps 10-15 10-15      Time 10 Minutes 10 Minutes        Treadmill   MPH 2 2.3      Grade 0 2      Minutes 15 15      METs 2.4 3.2        Recumbant Elliptical   Level 2 3      Minutes 15 15      METs 4 4.3         Exercise Comments:   Exercise Goals and Review:   Exercise Goals     Row Name 04/01/24 1050             Exercise Goals   Increase Physical Activity Yes       Intervention Provide advice, education, support and counseling about physical activity/exercise needs.;Develop an individualized exercise prescription for aerobic and resistive training based on initial evaluation findings, risk stratification, comorbidities  and participant's personal goals.       Expected Outcomes Short Term: Attend rehab on a regular basis to increase amount of physical activity.;Long Term: Add in home exercise to make exercise part of routine and to increase amount of physical activity.;Long Term: Exercising regularly at least 3-5 days a week.       Increase Strength and Stamina Yes       Intervention Provide advice, education, support and counseling about physical activity/exercise needs.;Develop an individualized exercise prescription for aerobic and resistive training based on initial evaluation findings, risk stratification, comorbidities and participant's personal goals.       Expected Outcomes Short Term: Increase workloads from initial exercise prescription for resistance, speed, and METs.;Short Term: Perform resistance training exercises routinely during rehab and add in resistance training at home;Long Term: Improve  cardiorespiratory fitness, muscular endurance and strength as measured by increased METs and functional capacity ( )       Able to understand and use rate of perceived exertion (RPE) scale Yes       Intervention Provide education and explanation on how to use RPE scale       Expected Outcomes Short Term: Able to use RPE daily in rehab to express subjective intensity level;Long Term:  Able to use RPE to guide intensity level when exercising independently       Able to understand and use Dyspnea scale Yes       Intervention Provide education and explanation on how to use Dyspnea scale       Expected Outcomes Short Term: Able to use Dyspnea scale daily in rehab to express subjective sense of shortness of breath during exertion;Long Term: Able to use Dyspnea scale to guide intensity level when exercising independently       Knowledge and understanding of Target Heart Rate Range (THRR) Yes       Intervention Provide education and explanation of THRR including how the numbers were predicted and where they are located for reference       Expected Outcomes Short Term: Able to state/look up THRR;Long Term: Able to use THRR to govern intensity when exercising independently;Short Term: Able to use daily as guideline for intensity in rehab       Understanding of Exercise Prescription Yes       Intervention Provide education, explanation, and written materials on patient's individual exercise prescription       Expected Outcomes Short Term: Able to explain program exercise prescription;Long Term: Able to explain home exercise prescription to exercise independently          Exercise Goals Re-Evaluation :  Exercise Goals Re-Evaluation     Row Name 04/02/24 0750 04/19/24 0938           Exercise Goal Re-Evaluation   Exercise Goals Review Increase Physical Activity;Able to understand and use Dyspnea scale;Understanding of Exercise Prescription;Increase Strength and Stamina;Knowledge and understanding of  Target Heart Rate Range (THRR);Able to understand and use rate of perceived exertion (RPE) scale Increase Physical Activity;Able to understand and use Dyspnea scale;Understanding of Exercise Prescription;Increase Strength and Stamina;Knowledge and understanding of Target Heart Rate Range (THRR);Able to understand and use rate of perceived exertion (RPE) scale      Comments Jaishaun is scheduled to begin exercise on 10/7. Will continue to monitor and progress as able. Holt has completed 4 exercise sessions. He exercises for 15 min on the Octane and treadmill. He averages 3.4 METs at level 3 on the Octane and 2.6 METs at 2 mph and 1%  incline on the treadmill. Barnabas performs the warmup and cooldown standing without limitations. He has increased his level on the Octane as METs have increased. His speed and incline have increased on the treadmil. Hazim is making good progress thus far. Will continue to monitor and progress as able.      Expected Outcomes Through exercise at rehab and home, the patient will decrease shortness of breath with daily activities and feel confident in carrying out an exercise regimen at home. Through exercise at rehab and home, the patient will decrease shortness of breath with daily activities and feel confident in carrying out an exercise regimen at home.         Discharge Exercise Prescription (Final Exercise Prescription Changes):  Exercise Prescription Changes - 04/30/24 1200       Response to Exercise   Blood Pressure (Admit) 116/60    Blood Pressure (Exercise) 154/84    Blood Pressure (Exit) 118/68    Heart Rate (Admit) 97 bpm    Heart Rate (Exercise) 132 bpm    Heart Rate (Exit) 104 bpm    Oxygen  Saturation (Admit) 97 %    Oxygen  Saturation (Exercise) 92 %    Oxygen  Saturation (Exit) 95 %    Rating of Perceived Exertion (Exercise) 11    Perceived Dyspnea (Exercise) 1    Duration Continue with 30 min of aerobic exercise without signs/symptoms of physical distress.     Intensity THRR unchanged      Progression   Progression Continue to progress workloads to maintain intensity without signs/symptoms of physical distress.      Resistance Training   Training Prescription Yes    Weight blue bands    Reps 10-15    Time 10 Minutes      Treadmill   MPH 2.3    Grade 2    Minutes 15    METs 3.2      Recumbant Elliptical   Level 3    Minutes 15    METs 4.3          Nutrition:  Target Goals: Understanding of nutrition guidelines, daily intake of sodium 1500mg , cholesterol 200mg , calories 30% from fat and 7% or less from saturated fats, daily to have 5 or more servings of fruits and vegetables.  Biometrics:  Pre Biometrics - 04/01/24 1048       Pre Biometrics   Grip Strength 26 kg           Nutrition Therapy Plan and Nutrition Goals:   Nutrition Assessments:  MEDIFICTS Score Key: >=70 Need to make dietary changes  40-70 Heart Healthy Diet <= 40 Therapeutic Level Cholesterol Diet   Picture Your Plate Scores: <59 Unhealthy dietary pattern with much room for improvement. 41-50 Dietary pattern unlikely to meet recommendations for good health and room for improvement. 51-60 More healthful dietary pattern, with some room for improvement.  >60 Healthy dietary pattern, although there may be some specific behaviors that could be improved.    Nutrition Goals Re-Evaluation:   Nutrition Goals Discharge (Final Nutrition Goals Re-Evaluation):   Psychosocial: Target Goals: Acknowledge presence or absence of significant depression and/or stress, maximize coping skills, provide positive support system. Participant is able to verbalize types and ability to use techniques and skills needed for reducing stress and depression.  Initial Review & Psychosocial Screening:  Initial Psych Review & Screening - 04/01/24 1052       Initial Review   Current issues with None Identified      Family Dynamics  Good Support System? Yes    Comments  spouse and daughter      Barriers   Psychosocial barriers to participate in program There are no identifiable barriers or psychosocial needs.      Screening Interventions   Interventions Encouraged to exercise          Quality of Life Scores:  Scores of 19 and below usually indicate a poorer quality of life in these areas.  A difference of  2-3 points is a clinically meaningful difference.  A difference of 2-3 points in the total score of the Quality of Life Index has been associated with significant improvement in overall quality of life, self-image, physical symptoms, and general health in studies assessing change in quality of life.  PHQ-9: Review Flowsheet  More data exists      04/01/2024 01/11/2024 12/21/2023 10/24/2023 10/03/2023  Depression screen PHQ 2/9  Decreased Interest 0 0 0 0 0  Down, Depressed, Hopeless 0 0 0 0 -  PHQ - 2 Score 0 0 0 0 0  Altered sleeping 0 0 - - -  Tired, decreased energy 0 3 - - -  Change in appetite 0 0 - - -  Feeling bad or failure about yourself  0 0 - - -  Trouble concentrating 0 1 - - -  Moving slowly or fidgety/restless 0 0 - - -  Suicidal thoughts 0 0 - - -  PHQ-9 Score 0 4 - - -  Difficult doing work/chores Not difficult at all Not difficult at all - - -   Interpretation of Total Score  Total Score Depression Severity:  1-4 = Minimal depression, 5-9 = Mild depression, 10-14 = Moderate depression, 15-19 = Moderately severe depression, 20-27 = Severe depression   Psychosocial Evaluation and Intervention:  Psychosocial Evaluation - 04/01/24 1052       Psychosocial Evaluation & Interventions   Interventions Encouraged to exercise with the program and follow exercise prescription    Comments Verdell denies any psychosocial barriers at this time.    Expected Outcomes For Treylin to participate in PR free of psychosocial concerns.    Continue Psychosocial Services  No Follow up required          Psychosocial Re-Evaluation:  Psychosocial  Re-Evaluation     Row Name 04/02/24 9061 04/22/24 1540           Psychosocial Re-Evaluation   Current issues with None Identified None Identified      Comments Andreu is scheduled to start PR on 04/09/24. No new psychosocial barriers or concerns since orientation. Shields continues to deny any psy/soc barriers or concerns at this time. He has good support from his family.      Expected Outcomes For Syrus to participate in PR free of any psychosocial barriers or concerns For Denzell to participate in PR free of any psychosocial barriers or concerns      Interventions Encouraged to attend Pulmonary Rehabilitation for the exercise Encouraged to attend Pulmonary Rehabilitation for the exercise      Continue Psychosocial Services  No Follow up required No Follow up required         Psychosocial Discharge (Final Psychosocial Re-Evaluation):  Psychosocial Re-Evaluation - 04/22/24 1540       Psychosocial Re-Evaluation   Current issues with None Identified    Comments Adem continues to deny any psy/soc barriers or concerns at this time. He has good support from his family.    Expected Outcomes For Malaky to participate in PR  free of any psychosocial barriers or concerns    Interventions Encouraged to attend Pulmonary Rehabilitation for the exercise    Continue Psychosocial Services  No Follow up required          Education: Education Goals: Education classes will be provided on a weekly basis, covering required topics. Participant will state understanding/return demonstration of topics presented.  Learning Barriers/Preferences:  Learning Barriers/Preferences - 04/01/24 1053       Learning Barriers/Preferences   Learning Barriers Language    Learning Preferences None          Education Topics: Know Your Numbers Group instruction that is supported by a PowerPoint presentation. Instructor discusses importance of knowing and understanding resting, exercise, and post-exercise oxygen   saturation, heart rate, and blood pressure. Oxygen  saturation, heart rate, blood pressure, rating of perceived exertion, and dyspnea are reviewed along with a normal range for these values.    Exercise for the Pulmonary Patient Group instruction that is supported by a PowerPoint presentation. Instructor discusses benefits of exercise, core components of exercise, frequency, duration, and intensity of an exercise routine, importance of utilizing pulse oximetry during exercise, safety while exercising, and options of places to exercise outside of rehab.    MET Level  Group instruction provided by PowerPoint, verbal discussion, and written material to support subject matter. Instructor reviews what METs are and how to increase METs.    Pulmonary Medications Verbally interactive group education provided by instructor with focus on inhaled medications and proper administration.   Anatomy and Physiology of the Respiratory System Group instruction provided by PowerPoint, verbal discussion, and written material to support subject matter. Instructor reviews respiratory cycle and anatomical components of the respiratory system and their functions. Instructor also reviews differences in obstructive and restrictive respiratory diseases with examples of each.    Oxygen  Safety Group instruction provided by PowerPoint, verbal discussion, and written material to support subject matter. There is an overview of "What is Oxygen " and "Why do we need it".  Instructor also reviews how to create a safe environment for oxygen  use, the importance of using oxygen  as prescribed, and the risks of noncompliance. There is a brief discussion on traveling with oxygen  and resources the patient may utilize.   Oxygen  Use Group instruction provided by PowerPoint, verbal discussion, and written material to discuss how supplemental oxygen  is prescribed and different types of oxygen  supply systems. Resources for more information  are provided.    Breathing Techniques Group instruction that is supported by demonstration and informational handouts. Instructor discusses the benefits of pursed lip and diaphragmatic breathing and detailed demonstration on how to perform both.  Flowsheet Row PULMONARY REHAB CHRONIC OBSTRUCTIVE PULMONARY DISEASE from 04/11/2024 in [facility]  Date 04/11/24  Educator RN  Instruction Review Code 1- Verbalizes Understanding     Risk Factor Reduction Group instruction that is supported by a PowerPoint presentation. Instructor discusses the definition of a risk factor, different risk factors for pulmonary disease, and how the heart and lungs work together.   Pulmonary Diseases Group instruction provided by PowerPoint, verbal discussion, and written material to support subject matter. Instructor gives an overview of the different type of pulmonary diseases. There is also a discussion on risk factors and symptoms as well as ways to manage the diseases.   Stress and Energy Conservation Group instruction provided by PowerPoint, verbal discussion, and written material to support subject matter. Instructor gives an overview of stress and the impact it  can have on the body. Instructor also reviews ways to reduce stress. There is also a discussion on energy conservation and ways to conserve energy throughout the day. Flowsheet Row PULMONARY REHAB CHRONIC OBSTRUCTIVE PULMONARY DISEASE from [facility]18/20[facility] in [facility]  Date 04/18/24  Educator RN  Instruction Review Code 1- Verbalizes Understanding    Warning Signs and Symptoms Group instruction provided by PowerPoint, verbal discussion, and written material to support subject matter. Instructor reviews warning signs and symptoms of stroke, heart attack, cold and flu. Instructor also reviews ways to prevent the spread of  infection. Flowsheet Row PULMONARY REHAB CHRONIC OBSTRUCTIVE PULMONARY DISEASE from 04/25/2024 in [facility]  Date 04/25/24  Educator RN  Instruction Review Code 1- Verbalizes Understanding    Other Education Group or individual verbal, written, or video instructions that support the educational goals of the pulmonary rehab program.    Knowledge Questionnaire Score:  Knowledge Questionnaire Score - 04/01/24 1207       Knowledge Questionnaire Score   Pre Score 15/18          Core Components/Risk Factors/Patient Goals at Admission:  Personal Goals and Risk Factors at Admission - 04/01/24 1053       Core Components/Risk Factors/Patient Goals on Admission   Improve shortness of breath with ADL's Yes    Intervention Provide education, individualized exercise plan and daily activity instruction to help decrease symptoms of SOB with activities of daily living.    Expected Outcomes Short Term: Improve cardiorespiratory fitness to achieve a reduction of symptoms when performing ADLs;Long Term: Be able to perform more ADLs without symptoms or delay the onset of symptoms          Core Components/Risk Factors/Patient Goals Review:   Goals and Risk Factor Review     Row Name 04/02/24 0940 04/22/24 1540           Core Components/Risk Factors/Patient Goals Review   Personal Goals Review Improve shortness of breath with ADL's;Develop more efficient breathing techniques such as purse lipped breathing and diaphragmatic breathing and practicing self-pacing with activity. Improve shortness of breath with ADL's;Develop more efficient breathing techniques such as purse lipped breathing and diaphragmatic breathing and practicing self-pacing with activity.      Review Johm is scheduled to start PR on 04/09/24. Unable to asses goals at this time. Monthly review of patient's Core Components/Risk Factors/Patient Goals are as follows: Goal  progressing for improving shortness of breath with ADL's. He is currently exercising on RA to keep sats >88%. Goal progressing for developing more efficient breathing techniques such as purse lipped breathing and diaphragmatic breathing; and practicing self-pacing with activity. We will continue to monitor his progress throughout the program.      Expected Outcomes To improve shortness of breath with ADL's and develop more efficient breathing techniques such as purse lipped breathing and diaphragmatic breathing; and practicing self-pacing with activity. To improve shortness of breath with ADL's and develop more efficient breathing techniques such as purse lipped breathing and diaphragmatic breathing; and practicing self-pacing with activity.         Core Components/Risk Factors/Patient Goals at Discharge (Final Review):   Goals and Risk Factor Review - 04/22/24 1540       Core Components/Risk Factors/Patient Goals Review   Personal Goals Review Improve shortness of breath with ADL's;Develop more efficient breathing techniques such as purse lipped breathing and diaphragmatic breathing and practicing self-pacing  with activity.    Review Monthly review of patient's Core Components/Risk Factors/Patient Goals are as follows: Goal progressing for improving shortness of breath with ADL's. He is currently exercising on RA to keep sats >88%. Goal progressing for developing more efficient breathing techniques such as purse lipped breathing and diaphragmatic breathing; and practicing self-pacing with activity. We will continue to monitor his progress throughout the program.    Expected Outcomes To improve shortness of breath with ADL's and develop more efficient breathing techniques such as purse lipped breathing and diaphragmatic breathing; and practicing self-pacing with activity.          ITP Comments:   Comments: Pt is making expected progress toward Pulmonary Rehab goals after completing 7 session(s).  Recommend continued exercise, life style modification, education, and utilization of breathing techniques to increase stamina and strength, while also decreasing shortness of breath with exertion.  Dr. Slater Staff is Medical Director for Pulmonary Rehab at [facility].

## 2024-05-02 ENCOUNTER — Encounter (HOSPITAL_COMMUNITY)
Admission: RE | Admit: 2024-05-02 | Discharge: 2024-05-02 | Disposition: A | Discharge status: Home / Self Care | Source: Ambulatory Visit | Attending: Pulmonary Disease | Admitting: Pulmonary Disease

## 2024-05-02 DIAGNOSIS — J449 Chronic obstructive pulmonary disease, unspecified: Secondary | ICD-10-CM

## 2024-05-02 NOTE — Progress Notes (Signed)
 Daily Session Note  Patient Details  Name: Duane Cuevas MRN: [id number] Date of Birth: [birth year] Referring Provider:   [name] Pulmonary Rehab Walk Test from 04/01/2024 in [facility], [facility], [facility]  Referring Provider Tamea    Encounter Date: 05/02/2024  Check In:  Session Check In - 05/02/24 1124       Check-In   Supervising physician immediately available to respond to emergencies [facility] MD immediately available    Physician(s) Rosaline Skains, NP    Location [facility]    Staff Present Ronal Levin, RN, BSN;Casey Claudene, RT;Randi [facility] BS, ACSM-CEP, Exercise Physiologist;Talecia Sherlin Nicholaus, MS, ACSM-CEP, Exercise Physiologist    Virtual Visit No    Medication changes reported     No    Fall or balance concerns reported    No    Tobacco Cessation No Change    Warm-up and Cool-down Performed as group-led instruction    Resistance Training Performed Yes    VAD Patient? No    PAD/SET Patient? No      Pain Assessment   Currently in Pain? No/denies    Multiple Pain Sites No          Capillary Blood Glucose: No results found for this or any previous visit (from the past 24 hours).    Social History   Tobacco Use  Smoking Status Every Day   Current packs/day: 1.00   Average packs/day: 1 pack/day for 53.8 years (53.8 ttl pk-yrs)   Types: Cigarettes   Start date: 33  Smokeless Tobacco Never  Tobacco Comments   02-27-2023  per pt daughter pt is still smoking, but hides how much per day,  smoking since age 32 (37)    Goals Met:  Proper associated with RPD/PD & O2 Sat Exercise tolerated well No report of concerns or symptoms today Strength training completed today  Goals Unmet:  Not Applicable  Comments: Service time is from 1017 to 1145.    Dr. Slater Staff is Medical Director for Pulmonary Rehab at [facility].

## 2024-05-06 ENCOUNTER — Encounter: Payer: Self-pay | Admitting: Radiology

## 2024-05-07 ENCOUNTER — Encounter (HOSPITAL_COMMUNITY)
Admission: RE | Admit: 2024-05-07 | Discharge: 2024-05-07 | Disposition: A | Discharge status: Home / Self Care | Source: Ambulatory Visit | Attending: Pulmonary Disease | Admitting: Pulmonary Disease

## 2024-05-07 DIAGNOSIS — J449 Chronic obstructive pulmonary disease, unspecified: Secondary | ICD-10-CM | POA: Insufficient documentation

## 2024-05-07 LAB — GLUCOSE, CAPILLARY: Glucose-Capillary: 327 mg/dL — ABNORMAL HIGH (ref 70–99)

## 2024-05-07 NOTE — Progress Notes (Signed)
 Daily Session Note  Patient Details  Name: Duane Cuevas MRN: [id number] Date of Birth: [birth year] Referring Provider:   [name] Pulmonary Rehab Walk Test from 04/01/2024 in [facility] for [facility], V[facility], & Lung Health  Referring Provider Tamea    Encounter Date: 05/07/2024  Check In:  Session Check In - 05/07/24 1131       Check-In   Supervising physician immediately available to respond to emergencies [facility] MD immediately available    Physician(s) Rosaline Skains, NP    [facility]    Staff Present Ronal Levin, RN, BSN;Maddalena Linarez Claudene, RT;Randi [facility] BS, ACSM-CEP, Exercise Physiologist;Kaylee Nicholaus, MS, ACSM-CEP, Exercise Physiologist    Virtual Visit No    Medication changes reported     No    Fall or balance concerns reported    No    Tobacco Cessation No Change    Warm-up and Cool-down Performed as group-led instruction    Resistance Training Performed Yes    VAD Patient? No    PAD/SET Patient? No      Pain Assessment   Currently in Pain? No/denies    Multiple Pain Sites No          Capillary Blood Glucose: Results for orders placed or performed during the hospital encounter of 05/02/24 (from the past 24 hours)  Glucose, capillary     Status: Abnormal   Collection Time: 05/07/24 10:21 AM  Result Value Ref Range   Glucose-Capillary 327 (H) 70 - 99 mg/dL      Social History   Tobacco Use  Smoking Status Every Day   Current packs/day: 1.00   Average packs/day: 1 pack/day for 53.8 years (53.8 ttl pk-yrs)   Types: Cigarettes   Start date: 13  Smokeless Tobacco Never  Tobacco Comments   02-27-2023  per pt daughter pt is still smoking, but hides how much per day,  smoking since age 31 (43)    Goals Met:  Proper associated with RPD/PD & O2 Sat Independence with exercise equipment Exercise tolerated well No report of concerns or symptoms today Strength training completed today  Goals Unmet:   Not Applicable  Comments: Service time is from 1012 to 1141.    Dr. Slater Staff is Medical Director for Pulmonary Rehab at [facility].

## 2024-05-09 ENCOUNTER — Encounter (HOSPITAL_COMMUNITY)
Admission: RE | Admit: 2024-05-09 | Discharge: 2024-05-09 | Disposition: A | Discharge status: Home / Self Care | Source: Ambulatory Visit | Attending: Pulmonary Disease | Admitting: Pulmonary Disease

## 2024-05-09 ENCOUNTER — Encounter: Payer: Self-pay | Admitting: Emergency Medicine

## 2024-05-09 DIAGNOSIS — J449 Chronic obstructive pulmonary disease, unspecified: Secondary | ICD-10-CM | POA: Diagnosis not present

## 2024-05-09 LAB — GLUCOSE, CAPILLARY: Glucose-Capillary: 299 mg/dL — ABNORMAL HIGH (ref 70–99)

## 2024-05-09 NOTE — Progress Notes (Signed)
 Daily Session Note  Patient Details  Name: Duane Cuevas MRN: [id number] Date of Birth: September 14, 1955 Referring Provider:   [name] Pulmonary Rehab Walk Test from 04/01/2024 in [facility] for Heart, Vascular, & Lung Health  Referring Provider Tamea    Encounter Date: 05/09/2024  Check In:  Session Check In - 05/09/24 1154       Check-In   Supervising physician immediately available to respond to emergencies [facility] immediately available    Physician(s) Orren Fabry, NP    Location [facility]    Staff Present Augustin Sharps, Neita Moats, MS, ACSM-CEP, Exercise Physiologist;Carlette Bernett, RN, Mallory Parkins, MS, ACSM-CEP, CCRP, Exercise Physiologist;Maria Cyrus, RN, BSN;Johnny Porter, MS, Exercise Physiologist    Virtual Visit No    Medication changes reported     No    Fall or balance concerns reported    No    Tobacco Cessation No Change    Warm-up and Cool-down Performed as group-led instruction    Resistance Training Performed Yes    VAD Patient? No    PAD/SET Patient? No      Pain Assessment   Currently in Pain? No/denies    Multiple Pain Sites No          Capillary Blood Glucose: Results for orders placed or performed during the hospital encounter of 05/07/24 (from the past 24 hours)  Glucose, capillary     Status: Abnormal   Collection Time: 05/09/24 10:17 AM  Result Value Ref Range   Glucose-Capillary 299 (H) 70 - 99 mg/dL      Social History   Tobacco Use  Smoking Status Every Day   Current packs/day: 1.00   Average packs/day: 1 pack/day for 53.8 years (53.8 ttl pk-yrs)   Types: Cigarettes   Start date: 42  Smokeless Tobacco Never  Tobacco Comments   02-27-2023  per pt daughter pt is still smoking, but hides how much per day,  smoking since age 47 (17)    Goals Met:  Proper associated with RPD/PD & O2 Sat Independence with exercise equipment Exercise tolerated well No report of  concerns or symptoms today Strength training completed today  Goals Unmet:  Not Applicable  Comments: Service time is from 1012 to 1140.    Dr. Slater Staff is Medical Director for Pulmonary Rehab at [facility].

## 2024-05-10 ENCOUNTER — Other Ambulatory Visit: Payer: Self-pay

## 2024-05-10 ENCOUNTER — Other Ambulatory Visit: Payer: Self-pay | Admitting: Emergency Medicine

## 2024-05-10 DIAGNOSIS — E1165 Type 2 diabetes mellitus with hyperglycemia: Secondary | ICD-10-CM

## 2024-05-10 DIAGNOSIS — I152 Hypertension secondary to endocrine disorders: Secondary | ICD-10-CM

## 2024-05-10 MED ORDER — TRESIBA FLEXTOUCH 100 UNIT/ML ~~LOC~~ SOPN
100.0000 [IU] | PEN_INJECTOR | Freq: Every day | SUBCUTANEOUS | 10 refills | Status: DC
Start: 2024-05-10 — End: 2024-05-23

## 2024-05-14 ENCOUNTER — Encounter (HOSPITAL_COMMUNITY)
Admission: RE | Admit: 2024-05-14 | Discharge: 2024-05-14 | Disposition: A | Discharge status: Home / Self Care | Source: Ambulatory Visit | Attending: Pulmonary Disease | Admitting: Pulmonary Disease

## 2024-05-14 VITALS — Wt 166.4 lb

## 2024-05-14 DIAGNOSIS — J449 Chronic obstructive pulmonary disease, unspecified: Secondary | ICD-10-CM

## 2024-05-14 NOTE — Progress Notes (Signed)
 Daily Session Note  Patient Details  Name: Duane Cuevas MRN: [id number] Date of Birth: 08/30/1955 Referring Provider:   [name] Pulmonary Rehab Walk Test from 04/01/2024 in [facility] for Heart, Vascular, & Lung Health  Referring Provider Tamea    Encounter Date: 05/14/2024  Check In:  Session Check In - 05/14/24 1116       Check-In   Supervising physician immediately available to respond to emergencies [facility] immediately available    Physician(s) Barnie Press, NP    [facility]    Staff Present Augustin Sharps, Neita Moats, MS, ACSM-CEP, Exercise Physiologist;Johnny Fayette, MS, Exercise Physiologist;Mary Harvy, RN, BSN;Randi Reeve BS, ACSM-CEP, Exercise Physiologist    Virtual Visit No    Medication changes reported     No    Fall or balance concerns reported    No    Tobacco Cessation No Change    Warm-up and Cool-down Performed as group-led instruction    Resistance Training Performed Yes    VAD Patient? No    PAD/SET Patient? No      Pain Assessment   Currently in Pain? No/denies    Multiple Pain Sites No          Capillary Blood Glucose: No results found for this or any previous visit (from the past 24 hours).   Exercise Prescription Changes - 05/14/24 1100       Response to Exercise   Blood Pressure (Admit) 118/60    Blood Pressure (Exercise) 136/62    Blood Pressure (Exit) 110/62    Heart Rate (Admit) 100 bpm    Heart Rate (Exercise) 114 bpm    Heart Rate (Exit) 101 bpm    Oxygen  Saturation (Admit) 95 %    Oxygen  Saturation (Exercise) 93 %    Oxygen  Saturation (Exit) 95 %    Rating of Perceived Exertion (Exercise) 12    Perceived Dyspnea (Exercise) 1    Duration Continue with 30 min of aerobic exercise without signs/symptoms of physical distress.    Intensity THRR unchanged      Progression   Progression Continue to progress workloads to maintain intensity without signs/symptoms  of physical distress.      Resistance Training   Training Prescription Yes    Weight blue bands    Reps 10-15    Time 10 Minutes      Treadmill   MPH 2.3    Grade 2    Minutes 15    METs 3.2      Recumbant Elliptical   Level 4    RPM 45    Minutes 15    METs 3.2          Social History   Tobacco Use  Smoking Status Every Day   Current packs/day: 1.00   Average packs/day: 1 pack/day for 53.9 years (53.9 ttl pk-yrs)   Types: Cigarettes   Start date: 19  Smokeless Tobacco Never  Tobacco Comments   02-27-2023  per pt daughter pt is still smoking, but hides how much per day,  smoking since age 68 (45)    Goals Met:  Proper associated with RPD/PD & O2 Sat Independence with exercise equipment Exercise tolerated well No report of concerns or symptoms today Strength training completed today  Goals Unmet:  Not Applicable  Comments: Service time is from 1000 to 1136.    Dr. Slater Staff is Medical Director for Pulmonary Rehab at [facility].

## 2024-05-16 ENCOUNTER — Encounter (HOSPITAL_COMMUNITY)
Admission: RE | Admit: 2024-05-16 | Discharge: 2024-05-16 | Disposition: A | Discharge status: Home / Self Care | Source: Ambulatory Visit | Attending: Pulmonary Disease | Admitting: Pulmonary Disease

## 2024-05-16 ENCOUNTER — Other Ambulatory Visit: Payer: Self-pay | Admitting: Emergency Medicine

## 2024-05-16 ENCOUNTER — Ambulatory Visit: Payer: Self-pay

## 2024-05-16 DIAGNOSIS — J449 Chronic obstructive pulmonary disease, unspecified: Secondary | ICD-10-CM | POA: Diagnosis not present

## 2024-05-16 DIAGNOSIS — J441 Chronic obstructive pulmonary disease with (acute) exacerbation: Secondary | ICD-10-CM | POA: Diagnosis not present

## 2024-05-16 NOTE — Telephone Encounter (Signed)
 Copied from CRM #[id number]. Topic: Clinical - Red Word Triage >> May 16, 2024 12:05 PM Franky GRADE wrote: Red Word that prompted transfer to Nurse Triage: Mary with [facility] Pulmonary Rehab  is calling because while was there and walking on the treadmill Low oxygen  between 84-85 %, they had him stop and it went up to 94%. He has experienced shortness of breath in the last couple of days.  Report received from [facility] with [facility] health Pulmonary Rehab. Patient no longer at the facility-patient was sent home. Patient was walking on the treadmill and his oxygen  levels dropped between 84 and 85%. The tech had him stop and rest. Oxygen  went up to 94%. Patient reported that he had been having shortness of breath for the last couple of days.   Spanish interpreter called-Maria [phone]. Call placed to patient. Unable to speak with patient, Hadassah left a message for patient to call back to Nurse Triage. Will place in call backs.

## 2024-05-16 NOTE — Telephone Encounter (Signed)
 Attempted callback x 3; LVM, Will route to the clinic for follow-up        [facility] Spanish Interpreter ID [phone]             Copied from CRM [id number]. Topic: Clinical - Red Word Triage >> May 16, 2024 12:05 PM Franky GRADE wrote: Red Word that prompted transfer to Nurse Triage: Mary with [facility] Pulmonary Rehab  is calling because while was there and walking on the treadmill Low oxygen  between 84-85 %, they had him stop and it went up to 94%. He has experienced shortness of breath in the last couple of days.   Report received from [facility] with [facility] health Pulmonary Rehab. Patient no longer at the facility-patient was sent home. Patient was walking on the treadmill and his oxygen  levels dropped between 84 and 85%. The tech had him stop and rest. Oxygen  went up to 94%. Patient reported that he had been having shortness of breath for the last couple of days

## 2024-05-16 NOTE — Telephone Encounter (Signed)
 Attempted callback x 2; LVM, Will attempt contact at a later time.     Vertell Spanish Interpreter ID [id number]       Copied from CRM [id number]. Topic: Clinical - Red Word Triage >> May 16, 2024 12:05 PM Duane Cuevas wrote: Red Word that prompted transfer to Nurse Triage: Duane Cuevas with [facility]  is calling because while was there and walking on the treadmill Low oxygen  between 84-85 %, they had him stop and it went up to 94%. He has experienced shortness of breath in the last couple of days.   Report received from [facility] with [facility] health Pulmonary Rehab. Patient no longer at the facility-patient was sent home. Patient was walking on the treadmill and his oxygen  levels dropped between 84 and 85%. The tech had him stop and rest. Oxygen  went up to 94%. Patient reported that he had been having shortness of breath for the last couple of days.

## 2024-05-16 NOTE — Progress Notes (Addendum)
 Pulmonary Rehab Incomplete Session Note  Patient Details  Name: Duane Cuevas MRN: [id number] Date of Birth: Jan 03, 1956 Referring Provider:   [name] Pulmonary Rehab Walk Test from 04/01/2024 in [facility] for [facility], [facility], [facility]  Referring Provider Duane Cuevas did not complete his rehab session. Mance came to [employer] today. While exercising on the treadmill O2 sats 84-85%. Lung sounds diminished with exp wheezes. Pt reported more shortness of breath past couple of days, congested cough. Took abuterol inhaler in class. Had pt stop exercising and sent home. Informed him RN will reach out to his PCP for an appointment.   Usual O2 sats on treadmill last 6 weeks runs 92-94% on RA  Pre Exercise VS 120/64, HR 100, 95% on RA, BS 265 Post Exercise VS 122/62, HR 107, 95% on RA, BS 246

## 2024-05-20 ENCOUNTER — Ambulatory Visit (HOSPITAL_BASED_OUTPATIENT_CLINIC_OR_DEPARTMENT_OTHER): Attending: Pulmonary Disease | Admitting: Internal Medicine

## 2024-05-20 DIAGNOSIS — G4733 Obstructive sleep apnea (adult) (pediatric): Secondary | ICD-10-CM | POA: Insufficient documentation

## 2024-05-20 DIAGNOSIS — Z9189 Other specified personal risk factors, not elsewhere classified: Secondary | ICD-10-CM

## 2024-05-20 NOTE — Telephone Encounter (Signed)
 Please schedule office visit with Dr. Tamea or Dr. Purcell.  Use oxygen .  Go to ER if sick.  Thanks

## 2024-05-21 ENCOUNTER — Encounter (HOSPITAL_COMMUNITY)
Admission: RE | Admit: 2024-05-21 | Discharge: 2024-05-21 | Disposition: A | Discharge status: Home / Self Care | Source: Ambulatory Visit | Attending: Pulmonary Disease | Admitting: Pulmonary Disease

## 2024-05-21 DIAGNOSIS — J449 Chronic obstructive pulmonary disease, unspecified: Secondary | ICD-10-CM | POA: Diagnosis not present

## 2024-05-21 NOTE — Telephone Encounter (Signed)
 Pt has been contacted and called

## 2024-05-21 NOTE — Progress Notes (Signed)
 Daily Session Note  Patient Details  Name: Duane Cuevas MRN: [id number] Date of Birth: [birth year] Referring Provider:   [name] Pulmonary Rehab Walk Test from 04/01/2024 in [facility] for [facility], Vascular, & Lung Health  Referring Provider Tamea    Encounter Date: 05/21/2024  Check In:  Session Check In - 05/21/24 1125       Check-In   Supervising physician immediately available to respond to emergencies [facility] MD immediately available    Physician(s) Josefa Beauvais, NP    Location [facility]    Staff Present Augustin Sharps, Neita Moats, MS, ACSM-CEP, Exercise Physiologist;Mary Harvy, RN, BSN;Randi Midge BS, ACSM-CEP, Exercise Physiologist;Maria Whitaker, RN, BSN    Virtual Visit No    Medication changes reported     No    Fall or balance concerns reported    No    Tobacco Cessation No Change    Warm-up and Cool-down Performed as group-led Writer Performed Yes    VAD Patient? No    PAD/SET Patient? No      Pain Assessment   Currently in Pain? No/denies    Multiple Pain Sites No          Capillary Blood Glucose: No results found for this or any previous visit (from the past 24 hours).    Social History   Tobacco Use  Smoking Status Every Day   Current packs/day: 1.00   Average packs/day: 1 pack/day for 53.9 years (53.9 ttl pk-yrs)   Types: Cigarettes   Start date: 87  Smokeless Tobacco Never  Tobacco Comments   02-27-2023  per pt daughter pt is still smoking, but hides how much per day,  smoking since age 68 (104)    Goals Met:  Proper associated with RPD/PD & O2 Sat Independence with exercise equipment Exercise tolerated well No report of concerns or symptoms today Strength training completed today  Goals Unmet:  Not Applicable  Comments: Service time is from 1016 to 1135.    Dr. Slater Staff is Medical Director for Pulmonary Rehab at [facility].

## 2024-05-23 ENCOUNTER — Encounter (HOSPITAL_COMMUNITY)
Admission: RE | Admit: 2024-05-23 | Discharge: 2024-05-23 | Disposition: A | Source: Ambulatory Visit | Attending: Pulmonary Disease

## 2024-05-23 ENCOUNTER — Encounter: Payer: Self-pay | Admitting: Emergency Medicine

## 2024-05-23 ENCOUNTER — Ambulatory Visit: Admitting: Emergency Medicine

## 2024-05-23 ENCOUNTER — Other Ambulatory Visit: Payer: Self-pay | Admitting: Emergency Medicine

## 2024-05-23 VITALS — BP 122/80 | HR 85 | Temp 98.2°F | Ht 68.0 in | Wt 168.0 lb

## 2024-05-23 DIAGNOSIS — J439 Emphysema, unspecified: Secondary | ICD-10-CM | POA: Diagnosis not present

## 2024-05-23 DIAGNOSIS — J449 Chronic obstructive pulmonary disease, unspecified: Secondary | ICD-10-CM | POA: Diagnosis not present

## 2024-05-23 DIAGNOSIS — E785 Hyperlipidemia, unspecified: Secondary | ICD-10-CM

## 2024-05-23 DIAGNOSIS — I152 Hypertension secondary to endocrine disorders: Secondary | ICD-10-CM | POA: Diagnosis not present

## 2024-05-23 DIAGNOSIS — E1159 Type 2 diabetes mellitus with other circulatory complications: Secondary | ICD-10-CM

## 2024-05-23 DIAGNOSIS — Z7985 Long-term (current) use of injectable non-insulin antidiabetic drugs: Secondary | ICD-10-CM | POA: Diagnosis not present

## 2024-05-23 DIAGNOSIS — I2510 Atherosclerotic heart disease of native coronary artery without angina pectoris: Secondary | ICD-10-CM

## 2024-05-23 DIAGNOSIS — E1165 Type 2 diabetes mellitus with hyperglycemia: Secondary | ICD-10-CM

## 2024-05-23 DIAGNOSIS — E1169 Type 2 diabetes mellitus with other specified complication: Secondary | ICD-10-CM

## 2024-05-23 MED ORDER — TRULICITY 1.5 MG/0.5ML ~~LOC~~ SOAJ: SUBCUTANEOUS | 10 refills | Status: DC

## 2024-05-23 MED ORDER — TRESIBA FLEXTOUCH 100 UNIT/ML ~~LOC~~ SOPN: 100.0000 [IU] | PEN_INJECTOR | Freq: Every day | SUBCUTANEOUS | 10 refills | Status: DC

## 2024-05-23 NOTE — Progress Notes (Signed)
 Daily Session Note  Patient Details  Name: Duane Cuevas MRN: [id number] Date of Birth: [birth year] Referring Provider:   [name] Pulmonary Rehab Walk Test from 04/01/2024 in [facility] for Heart, Vascular, & Lung [facility]  Referring Provider Tamea    Encounter Date: 05/23/2024  Check In:  Session Check In - 05/23/24 1029       Check-In   Supervising physician immediately available to respond to emergencies [facility] MD immediately available    Physician(s) Orren Fabry, PA    Location [facility]    Staff Present Augustin Sharps, Neita Moats, MS, ACSM-CEP, Exercise Physiologist;Mary Harvy, RN, BSN;Randi Reeve BS, ACSM-CEP, Exercise Physiologist    Virtual Visit No    Medication changes reported     No    Fall or balance concerns reported    No    Tobacco Cessation No Change    Warm-up and Cool-down Performed as group-led instruction    Resistance Training Performed Yes    VAD Patient? No    PAD/SET Patient? No      Pain Assessment   Currently in Pain? No/denies    Pain Score 0-No pain    Multiple Pain Sites No          Capillary Blood Glucose: No results found for this or any previous visit (from the past 24 hours).    Social History   Tobacco Use  Smoking Status Every Day   Current packs/day: 1.00   Average packs/day: 1 pack/day for 53.9 years (53.9 ttl pk-yrs)   Types: Cigarettes   Start date: 11  Smokeless Tobacco Never  Tobacco Comments   02-27-2023  per pt daughter pt is still smoking, but hides how much per day,  smoking since age 29 (56)    Goals Met:  Proper associated with RPD/PD & O2 Sat Independence with exercise equipment Exercise tolerated well No report of concerns or symptoms today Strength training completed today  Goals Unmet:  Not Applicable  Comments: Service time is from 1014 to 1145.    Dr. Slater Staff is Medical Director for Pulmonary Rehab at [facility].

## 2024-05-23 NOTE — Patient Instructions (Signed)
 Enfermedad pulmonar obstructiva crnica Chronic Obstructive Pulmonary Disease  La enfermedad pulmonar obstructiva crnica (EPOC) es un problema pulmonar de larga duracin (crnico). Cuando una persona tiene EPOC, puede resultarle ms difcil inhalar o exhalar. La afeccin puede empeorar con [employer]. Puede hacer algunas cosas para mantenerse lo ms sano posible. Cules son las causas? Fumar. Esta es la causa ms frecuente. Inhalar vapores, humo o sustancias qumicas durante [place]. Genes que son hereditarios, es decir que se transmiten de padres a hijos. Cules son los signos o sntomas? Falta de aire. Puede suceder [employer]. Puede empeorar al mover el cuerpo. Puede empeorar con [employer]. Es posible que haya momentos en los que empeora mucho de repente. Esos momentos se denominan "exacerbaciones". Tos prolongada, con o sin mucosidad espesa. Sibilancias. Opresin en el pecho. Cansancio. Imposibilidad de realizar [employer] Lee. Cmo se diagnostica? Esta afeccin se diagnostica en funcin de lo siguiente: Sus antecedentes mdicos. Un examen fsico. Pruebas de la funcin de los pulmones (pulmonar). Es posible que le realicen una prueba que mide el flujo de aire que sale de los pulmones al Neurosurgeon. Tambin pueden hacerle estudios, que incluyen los siguientes: Radiografa de trax. Exploracin por tomografa computarizada (TC). Anlisis de [place]. Cmo se trata? El tratamiento para esta afeccin puede incluir lo siguiente: Si fuma, dejar de hacerlo. Usar oxgeno. Usar medicamentos. Pueden incluir: Inhaladores. Estos contienen medicamentos que se inhalan. Inhaladores de uso diario. Ayudan a evitar que ocurran sntomas. Por lo general, se usan [employer]Max para prevenir las exacerbaciones de la EPOC. Inhaladores de alivio rpido. Actan rpidamente para aliviar los sntomas. Se utilizan solo cuando es necesario y proporcionan alivio a corto plazo. Otros  medicamentos que se inhalan o se tragan. Estos pueden usarse para abrir las vas respiratorias, diluir la mucosidad o tratar infecciones. Ejercicios de respiracin para ayudarle a controlar o recuperar la respiracin. Un dispositivo para eliminar la mucosidad, si tiene mucha mucosidad espesa. Rehabilitacin pulmonar. Un lugar donde aprender [employer] afeccin y las mejores formas de controlarla. [place]. Siga estas indicaciones en su casa: Medicamentos Tome los Monsanto Company se lo haya indicado el mdico. Consulte al mdico antes de tomar medicamentos para la tos o las [place]. Es posible que Water engineer los medicamentos que hagan que los pulmones se sequen. Estilo de vida [employer]state manos con agua y jabn durante al menos 20 segundos varias veces al [employer]. Utilice un desinfectante para manos si no dispone de agua y jabn. Esto puede ayudar a evitar que contraiga una infeccin. Evite estar cerca de multitudes o de personas enfermas. No fume ni consuma ningn producto que contenga nicotina o tabaco. Si necesita ayuda para dejar de consumir estos productos, consulte al mdico. Mantngase activo. Aprenda a moderar el ritmo de su actividad durante [employer]. Use mtodos de respiracin para controlar el estrs y recuperar el aliento. Beba suficiente cantidad de lquido para Radio producer pis (la [place]) de color amarillo plido, a menos que le hayan indicado lo contrario. Consuma alimentos saludables. Consuma pequeas cantidades de alimentos con mayor frecuencia. Duerma lo suficiente. La mayora de los adultos necesitan 7 horas o ms por noche. Indicaciones generales Realice un plan de accin para la EPOC junto a su mdico. Esto lo ayudar a saber qu debe hacer si se siente peor que de costumbre. Asegrese de recibir todas las vacunas que el mdico le recomiende. Consulte al [employer] vacunas para la gripe y la neumona. Si necesita oxigenoterapia en su casa, pregntele al mdico  con qu  frecuencia debe controlar su nivel de oxgeno con un dispositivo llamado "oxmetro". Concurra a todas las visitas de seguimiento para revisar su plan de accin para la EPOC. El mdico querr controlar su afeccin con frecuencia para Network engineer sano y fuera del hospital. Comunquese con un mdico si: Tose y elimina ms mucosidad que lo habitual. Hay un cambio en el color o en la consistencia de la mucosidad. Respirar le resulta ms difcil de lo habitual, o le falta el aire mientras est en reposo. Debe usar Therapist, nutritional de alivio rpido con ms frecuencia. Tiene dificultad para Xcel Energy cotidianas, como vestirse o caminar por la casa. La piel o las uas se le ponen de Edison International. Tiene fiebre o escalofros. Solicite ayuda de inmediato si: Le falta el aire y no puede hacer lo siguiente: Expresarse con oraciones completas. Hacer actividades normales. Tiene dolor en el pecho. Se siente confundido. Estos sntomas pueden Customer service manager. Llame al 911 de inmediato. No espere a ver si los sntomas desaparecen. No conduzca por sus propios medios [employer]Max Incorporated. Esta informacin no tiene Theme park manager el consejo del mdico. Asegrese de hacerle al mdico cualquier pregunta que tenga. Document Revised: 10/19/2022 Document Reviewed: 10/19/2022 Elsevier Patient Education  2024 [employer].

## 2024-05-23 NOTE — Telephone Encounter (Signed)
 He is on daily Tresiba  30 units

## 2024-05-23 NOTE — Telephone Encounter (Signed)
 Please advise how many units and I will correct

## 2024-05-23 NOTE — Assessment & Plan Note (Signed)
 Well-controlled hypertension.  Continue amlodipine  5 mg daily Well-controlled diabetes with hemoglobin A1c of 7.2 Diet and nutrition discussed Cardiovascular risks associated with hypertension and diabetes discussed Continue weekly Trulicity  1.5 mg, daily Farxiga  10 mg, glipizide  10 mg before breakfast, Premeal insulin  aspart as per sliding scale, and daily Tresiba  30 units Glucose numbers at home have been elevated due to corticosteroid use.  Has been covering with short acting insulin  as per numbers. Recommend endocrinology evaluation.

## 2024-05-23 NOTE — Progress Notes (Signed)
 Duane Cuevas 68 y.o.   Chief Complaint  Patient presents with   oxygen     Low O2 pt is going to pulmonary on Wednesday to have a sleep study test done     HISTORY OF PRESENT ILLNESS: This is a 68 y.o. male was seen at pulmonary clinic last week and found to have episode of low O2 sat 85%.  Was asymptomatic at the time.  Was advised to make appointment with PCP.  Here today for follow-up.  Had sleep study done 3 days ago.  Report not available yet. Feeling well today.  Has no other complaints or medical concerns. Needs refills on Tresiba  and Trulicity    HPI   Prior to Admission medications   Medication Sig Start Date End Date Taking? Authorizing Provider  albuterol  (PROVENTIL ) (2.5 MG/3ML) 0.083% nebulizer solution INHALE ONE VIAL VIA NEBULIZER EVERY 6 HOURS AS NEEDED FOR WHEEZING OR FOR SHORTNESS OF BREATH 05/16/24   Tresia Revolorio Jose, MD  albuterol  (VENTOLIN  HFA) [phone] Base) MCG/ACT inhaler 2 inhalaciones Inhalaci n Cada 6 horas PRN, sibilancias, dificultad para respirar 12/16/23   Rojelio Nest, DO  amLODipine  (NORVASC ) 5 MG tablet Take 1 tablet (5 mg total) by mouth daily. TOMAR 1 TABLETA POR VAI ORAL UNA VEZ AL DIA 01/11/24   Purcell Emil Schanz, MD  aspirin  EC 81 MG tablet Take 1 tablet (81 mg total) by mouth daily. Swallow whole. 09/01/22   Magnant, Carlin CROME, PA-C  atorvastatin  (LIPITOR) 20 MG tablet Take 1 tablet (20 mg total) by mouth daily. TOMAR 1 TABLETA POR VIA ORAL UNA VEZ AL DIA 10/26/23   Purcell Emil Schanz, MD  budesonide -glycopyrrolate-formoterol  (BREZTRI  AEROSPHERE) 160-9-4.8 MCG/ACT AERO inhaler INHALE TWO PUFFS BY MOUTH TWICE A DAY 03/08/24   Purcell Emil Schanz, MD  Continuous Glucose Sensor (FREESTYLE LIBRE 14 DAY SENSOR) MISC APPLY ONE SENSOR TO THE BACK OF YOUR UPPER ARM. REPLACE EVERY 14 DAYS. 05/10/24   Purcell Emil Schanz, MD  cyclobenzaprine  (FLEXERIL ) 10 MG tablet Take 1 tablet (10 mg total) by mouth 2 (two) times daily as needed for muscle  spasms. 09/27/23   Nivia Colon, PA-C  Dulaglutide  (TRULICITY ) 1.5 MG/0.5ML EMMANUEL FLEMINGS CONTENIDOS DE UN LAPIZ POR VIA SUBCUTANEA CADA [place], EL MISMO DIA CADA [facility] 05/23/24   Purcell Emil Schanz, MD  FARXIGA  10 MG TABS tablet TOMAR 1 TABLETA POR VIA ORAL UNA VEZ AL DIA 10/24/23   Purcell Emil Schanz, MD  gabapentin  (NEURONTIN ) 300 MG capsule Take 1 capsule (300 mg total) by mouth at bedtime as needed. 09/24/23   Magnant, Charles L, PA-C  glipiZIDE  (GLUCOTROL ) 10 MG tablet TOMAR 1 TABLETA POR VIA ORAL UNA VEZ AL DIA ANTES DEL DESAYUNO *TOMAR 1 TABLETA ADICIONAL SI AZUCAR EN LA SANGRE ESTA ALTA* 10/24/23   Purcell Emil Schanz, MD  glucose blood test strip Use as instructed 02/14/24   Purcell Emil Schanz, MD  guaifenesin  (ROBITUSSIN) 100 MG/5ML syrup Take 200 mg by mouth 3 (three) times daily as needed for cough.    [provider]  ibuprofen  (ADVIL ) 800 MG tablet Take 1 tablet (800 mg total) by mouth every 8 (eight) hours as needed. 08/25/23   Magnant, Carlin CROME, PA-C  insulin  aspart (NOVOLOG  FLEXPEN) 100 UNIT/ML FlexPen INYECTAR 3 UNIDADES POR VIA SUBCUTANEA TRES VECES AL DIA COMO INDICADO. AJUSTAR CANTIDAD DE INSULINA POR ESCALA MOVIL. MAS DOSIS 30 UNIDADES 08/02/23   Oluwadarasimi Favor Jose, MD  insulin  degludec (TRESIBA  FLEXTOUCH) 100 UNIT/ML FlexTouch Pen Inject 100 Units into the skin daily. INYECTAR  25 UNIDADES POR VIA SUBCUTANEA DIARIAMENTE 05/23/24   Purcell Emil Schanz, MD  mupirocin  ointment (BACTROBAN ) 2 % Apply 1 Application topically daily. 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  Follow discharge instructions on paperwork for duration Patient not taking: Reported on 04/01/2024 08/09/23   Magnant, Carlin CROME, PA-C  pantoprazole  (PROTONIX ) 40 MG tablet Take 1 tablet (40 mg total) by mouth daily. TOMAR 1 TABLETA POR VIA ORAL UNA VEZ AL DIA 01/11/24   Larry Knipp, Emil Schanz, MD  predniSONE  (DELTASONE ) 10 MG tablet Take 4 tabs daily for 3 days, then 3 tabs daily for 3  days, then 2 tabs daily for 3 days, then 1 tab daily for 3 days, then 1/2 tab daily for 4 days. Patient not taking: Reported on 04/01/2024 12/16/23   Rojelio Nest, DO  sildenafil  (VIAGRA ) 100 MG tablet Take 0.5-1 tablets (50-100 mg total) by mouth daily as needed for erectile dysfunction. Patient not taking: Reported on 04/01/2024 12/29/22   Purcell Emil Schanz, MD    No Known Allergies  Patient Active Problem List   Diagnosis Date Noted   Hypocalcemia 12/13/2023   Essential hypertension 10/07/2023   Arthritis of right shoulder region 08/20/2023   S/P reverse total shoulder arthroplasty, 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 GOLD III/ active smoker 08/14/2018   Hypokalemia 09/23/2015   COPD (chronic obstructive pulmonary disease) (HCC) 10/14/2014   Dyslipidemia associated with type 2 diabetes mellitus (HCC) 10/14/2014    Past Medical History:  Diagnosis Date   Cerebral aneurysm, nonruptured    followed by dr n. 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   Coronary artery disease    (pt admitted for chest pain ) nuclear stress test 11-02-2019  low risk, nuclear ef 58%,  prior inferior defect of severe severity indicative of previous MI;;   echo 04-28-2020 ef 60-65%, mild concentric LVH, RSVP 54.38mmHg;;   cardiac CTA 09-15-2020  unremarkable,  calcium  score=zero   Dependence on nocturnal oxygen  therapy    02-27-2023  per pt daughter,  pt only uses at night  w/ O2 sat ,2-3L via [place],  only uses portable if going outside when every hot which is not much   Diverticulosis of colon    DOE (dyspnea on exertion)    02-27-2023  per pt daugter sob w/ stairs, incline's,  ok w/ some yard work if not warm/ hot wearther,  can do house hold chores   ED (erectile dysfunction)    Emphysema/COPD    followed by pcp;   uses breztri  bid, uses preventil nebulizer every afternoon;   last exacerbation 06/ 2023 in epic   Full dentures    GERD (gastroesophageal reflux disease)    Hepatic steatosis    History of adenomatous polyp of colon    History of gastric ulcer    per EGD 09-16-2020  non-bleeding   History of MI (myocardial infarction)    per nuclear stress test in epic 11-02-2019 showed prior inferior defect of severe severity indicative of previous MI   History of pelvic  fracture    1999  and 2009   Hyperlipidemia    Hypertension    Smokers' cough (HCC)    Type 2 diabetes mellitus treated with insulin  [facility])    followed by pcp;  dx 2016   (02-27-2023  per pt daughter pt   Wears glasses     Past Surgical History:  Procedure Laterality Date   BICEPT TENODESIS Right 08/08/2023   Procedure: BICEPS TENODESIS;  Surgeon: Addie Cordella Hamilton, MD;  Location: [facility] OR;  Service: Orthopedics;  Laterality: Right;   BIOPSY  09/16/2020   Procedure: BIOPSY;  Surgeon: Eda Iha, MD;  Location: [facility];  Service: Gastroenterology;;   CHOLECYSTECTOMY N/A 03/20/2022   Procedure: LAPAROSCOPIC CHOLECYSTECTOMY;  Surgeon: Eletha Boas, MD;  Location: [facility];  Service: General;  Laterality: N/A;   COLONOSCOPY WITH PROPOFOL   05/10/2018   dr leigh   ESOPHAGOGASTRODUODENOSCOPY N/A 09/23/2015   Procedure: ESOPHAGOGASTRODUODENOSCOPY (EGD);  Surgeon: Gwendlyn ONEIDA Buddy, MD;  Location: [facility];  Service: Endoscopy;  Laterality: N/A;   ESOPHAGOGASTRODUODENOSCOPY (EGD) WITH PROPOFOL  N/A 09/16/2020   Procedure: ESOPHAGOGASTRODUODENOSCOPY (EGD) WITH PROPOFOL ;  Surgeon:  Eda Iha, MD;  Location: [facility];  Service: Gastroenterology;  Laterality: N/A;   EXCISION MASS NECK Left 03/09/2023   Procedure: EXCISION SOFT TISSUE MASS LEFT POSTERIOR NECK;  Surgeon: Eletha Boas, MD;  Location: [facility];  Service: General;  Laterality: Left;   FOOT SURGERY Left 1999   INGUINAL HERNIA REPAIR Left 05/15/2018   Procedure: LEFT INGUINAL HERNIA REPAIR WITH MESH;  Surgeon: Vanderbilt Ned, MD;  Location: [facility] OR;  Service: General;  Laterality: Left;   INSERTION OF MESH Left 05/15/2018   Procedure: INSERTION OF MESH;  Surgeon: Vanderbilt Ned, MD;  Location: [facility] OR;  Service: General;  Laterality: Left;   INTRAOPERATIVE CHOLANGIOGRAM N/A 03/20/2022   Procedure: INTRAOPERATIVE CHOLANGIOGRAM;  Surgeon: Eletha Boas, MD;  Location: [facility];  Service: General;  Laterality: N/A;   REVERSE SHOULDER ARTHROPLASTY Left 08/30/2022   Procedure: LEFT REVERSE SHOULDER ARTHROPLASTY;  Surgeon: Addie Cordella Hamilton, MD;  Location: [facility] OR;  Service: Orthopedics;  Laterality: Left;   REVERSE SHOULDER ARTHROPLASTY Right 08/08/2023   Procedure: RIGHT REVERSE SHOULDER ARTHROPLASTY;  Surgeon: Addie Cordella Hamilton, MD;  Location: [facility] OR;  Service: Orthopedics;  Laterality: Right;   SHOULDER ARTHROSCOPY W/ ROTATOR CUFF REPAIR Left    yrs ago    Social History   Socioeconomic History   Marital status: Married    Spouse name: Zoraida   Number of children: 5   Years of education: Not on file   Highest education level: 9th grade  Occupational History   Occupation: Employed    Associate Professor: [employer]  Tobacco Use   Smoking status: Every Day    Current packs/day: 1.00    Average packs/day: 1 pack/day for 53.9 years (53.9 ttl pk-yrs)    Types: Cigarettes    Start date: 1972   Smokeless tobacco: Never   Tobacco comments:    02-27-2023  per pt daughter pt is still smoking, but hides how much per day,  smoking since age 39 (29)  Vaping Use   Vaping status: Never Used  Substance and  Sexual Activity   Alcohol use: Not Currently    Alcohol/week: 2.0 standard drinks of alcohol    Types: 2 Cans of beer per week    Comment: 2 beers per week   Drug use: Never   Sexual activity: Yes  Other Topics Concern   Not on file  Social History Narrative  Lives with wife. 2025 Employed full time as a designer, fashion/clothing.  6 children.   Social Drivers of Corporate Investment Banker Strain: High Risk (01/11/2024)   Overall Financial Resource Strain (CARDIA)    Difficulty of Paying Living Expenses: Very hard  Food Insecurity: Food Insecurity Present (01/11/2024)   Hunger Vital Sign    Worried About Running Out of Food in the Last Year: Sometimes true    Ran Out of Food in the Last Year: Sometimes true  Transportation Needs: No Transportation Needs (01/11/2024)   PRAPARE - Administrator, Civil Service (Medical): No    Lack of Transportation (Non-Medical): No  Physical Activity: Sufficiently Active (01/11/2024)   Exercise Vital Sign    Days of Exercise per Week: 7 days    Minutes of Exercise per Session: 30 min  Stress: No Stress Concern Present (01/11/2024)   [employer] of Occupational Health - Occupational Stress Questionnaire    Feeling of Stress: Not at all  Social Connections: Moderately Isolated (01/11/2024)   Social Connection and Isolation Panel    Frequency of Communication with Friends and Family: More than three times a week    Frequency of Social Gatherings with Friends and Family: More than three times a week    Attends Religious Services: Never    Database Administrator or Organizations: No    Attends Banker Meetings: Never    Marital Status: Married  Catering Manager Violence: Not At Risk (01/11/2024)   Humiliation, Afraid, Rape, and Kick questionnaire    Fear of Current or Ex-Partner: No    Emotionally Abused: No    Physically Abused: No    Sexually Abused: No    Family History  Problem Relation Age of Onset   Emphysema Mother         passed away on 2023/07/24   Diabetes Mother    Emphysema Father    Cancer Sister        Stomach cancer; pt says that pt had 12 sisters but the one w/ stomach cancer passed away   Diabetes Sister    Stomach cancer Sister    Diabetes Brother    Colon cancer Neg Hx    Colon polyps Neg Hx    Esophageal cancer Neg Hx    Rectal cancer Neg Hx      Review of Systems  Constitutional: Negative.  Negative for chills and fever.  HENT: Negative.  Negative for congestion and sore throat.   Respiratory: Negative.  Negative for cough and shortness of breath.   Cardiovascular: Negative.  Negative for chest pain and palpitations.  Gastrointestinal:  Negative for abdominal pain, diarrhea, nausea and vomiting.  Genitourinary: Negative.  Negative for dysuria and hematuria.  Skin: Negative.  Negative for rash.  Neurological: Negative.  Negative for dizziness and headaches.  All other systems reviewed and are negative.   Vitals:   05/23/24 0848  BP: 122/80  Pulse: 85  Temp: 98.2 F (36.8 C)  SpO2: 95%    Physical Exam Vitals reviewed.  Constitutional:      Appearance: Normal appearance.  HENT:     Head: Normocephalic.     Mouth/Throat:     Mouth: Mucous membranes are moist.     Pharynx: Oropharynx is clear.  Eyes:     Extraocular Movements: Extraocular movements intact.     Conjunctiva/sclera: Conjunctivae normal.     Pupils: Pupils are equal, round, and reactive to light.  Cardiovascular:     Rate  and Rhythm: Normal rate and regular rhythm.     Pulses: Normal pulses.     Heart sounds: Normal heart sounds.  Pulmonary:     Effort: Pulmonary effort is normal.     Breath sounds: Normal breath sounds.  Abdominal:     Palpations: Abdomen is soft.     Tenderness: There is no abdominal tenderness.  Musculoskeletal:     Cervical back: No tenderness.  Lymphadenopathy:     Cervical: No cervical adenopathy.  Skin:    General: Skin is warm and dry.     Capillary Refill: Capillary refill  takes less than 2 seconds.  Neurological:     General: No focal deficit present.     Mental Status: He is alert and oriented to person, place, and time.  Psychiatric:        Mood and Affect: Mood normal.        Behavior: Behavior normal.      ASSESSMENT & PLAN: A total of 40 minutes was spent with the patient and counseling/coordination of care regarding preparing for this visit, review of most recent office visit notes, review of multiple chronic medical conditions and their management, review of all medications, review of most recent bloodwork results, review of health maintenance items, education on nutrition, prognosis, documentation, and need for follow up.   Problem List Items Addressed This Visit       Cardiovascular and Mediastinum   CAD (coronary artery disease)   Stable without recent anginal episodes Continue daily baby aspirin  and atorvastatin  20 mg daily      Hypertension associated with diabetes (HCC)   Well-controlled hypertension.  Continue amlodipine  5 mg daily Well-controlled diabetes with hemoglobin A1c of 7.2 Diet and nutrition discussed Cardiovascular risks associated with hypertension and diabetes discussed Continue weekly Trulicity  1.5 mg, daily Farxiga  10 mg, glipizide  10 mg before breakfast, Premeal insulin  aspart as per sliding scale, and daily Tresiba  30 units Glucose numbers at home have been elevated due to corticosteroid use.  Has been covering with short acting insulin  as per numbers. Recommend endocrinology evaluation.      Relevant Medications   Dulaglutide  (TRULICITY ) 1.5 MG/0.5ML SOAJ   insulin  degludec (TRESIBA  FLEXTOUCH) 100 UNIT/ML FlexTouch Pen     Respiratory   COPD (chronic obstructive pulmonary disease) (HCC) - Primary   Clinically stable. Recently evaluated by pulmonary Dr. Pulmonary function testing done recently Sleep apnea test done 3 days ago Continues Breztri  2 inhalations twice a day and albuterol  inhaler as needed         Endocrine   Dyslipidemia associated with type 2 diabetes mellitus (HCC) (Chronic)   Chronic stable conditions Continue atorvastatin  20 mg daily Diet and nutrition discussed      Relevant Medications   Dulaglutide  (TRULICITY ) 1.5 MG/0.5ML SOAJ   insulin  degludec (TRESIBA  FLEXTOUCH) 100 UNIT/ML FlexTouch Pen   Uncontrolled type 2 diabetes mellitus with hyperglycemia (HCC)   Relevant Medications   Dulaglutide  (TRULICITY ) 1.5 MG/0.5ML SOAJ   insulin  degludec (TRESIBA  FLEXTOUCH) 100 UNIT/ML FlexTouch Pen   Patient Instructions  Enfermedad pulmonar obstructiva crnica Chronic Obstructive Pulmonary Disease  La enfermedad pulmonar obstructiva crnica (EPOC) es un problema pulmonar de larga duracin (crnico). Cuando una persona tiene EPOC, puede resultarle ms difcil inhalar o exhalar. La afeccin puede empeorar con allied waste industries. Puede hacer algunas cosas para mantenerse lo ms sano posible. Cules son las causas? Fumar. Esta es la causa ms frecuente. Inhalar vapores, humo o sustancias qumicas durante [place]. Genes que son hereditarios, es designer, jewellery  que se transmiten de padres a hijos. Cules son los signos o sntomas? Falta de aire. Puede suceder vf corporation. Puede empeorar al mover el cuerpo. Puede empeorar con allied waste industries. Es posible que haya momentos en los que empeora mucho de repente. Esos momentos se denominan "exacerbaciones". Tos prolongada, con o sin mucosidad espesa. Sibilancias. Opresin en el pecho. Cansancio. Imposibilidad de realizar sara lee. Cmo se diagnostica? Esta afeccin se diagnostica en funcin de lo siguiente: Sus antecedentes mdicos. Un examen fsico. Pruebas de la funcin de los pulmones (pulmonar). Es posible que le realicen una prueba que mide el flujo de aire que sale de los pulmones al neurosurgeon. Tambin pueden hacerle estudios, que incluyen los siguientes: Radiografa de trax. Exploracin por tomografa computarizada  (TC). Anlisis de [place]. Cmo se trata? El tratamiento para esta afeccin puede incluir lo siguiente: Si fuma, dejar de hacerlo. Usar oxgeno. Usar medicamentos. Pueden incluir: Inhaladores. Estos contienen medicamentos que se inhalan. Inhaladores de uso diario. Ayudan a evitar que ocurran sntomas. Por lo general, se usan carmax para prevenir las exacerbaciones de la EPOC. Inhaladores de alivio rpido. Actan rpidamente para aliviar los sntomas. Se utilizan solo cuando es necesario y proporcionan alivio a corto plazo. Otros medicamentos que se inhalan o se tragan. Estos pueden usarse para abrir las vas respiratorias, diluir la mucosidad o tratar infecciones. Ejercicios de respiracin para ayudarle a controlar o recuperar la respiracin. Un dispositivo para eliminar la mucosidad, si tiene mucha mucosidad espesa. Rehabilitacin pulmonar. Un lugar donde aprender suntrust afeccin y las mejores formas de controlarla. Ciruga. Siga estas indicaciones en su casa: Medicamentos Tome los monsanto company se lo haya indicado el mdico. Consulte al mdico antes de tomar medicamentos para la tos o las [place]. Es posible que water engineer los medicamentos que hagan que los pulmones se sequen. Estilo de vida [employer] manos con agua y jabn durante al menos 20 segundos varias veces al c.h. [place] worldwide. Utilice un desinfectante para manos si no dispone de agua y jabn. Esto puede ayudar a evitar que contraiga una infeccin. Evite estar cerca de multitudes o de personas enfermas. No fume ni consuma ningn producto que contenga nicotina o tabaco. Si necesita ayuda para dejar de consumir estos productos, consulte al mdico. Mantngase activo. Aprenda a moderar el ritmo de su actividad durante mellon financial. Use mtodos de respiracin para controlar el estrs y recuperar el aliento. Beba suficiente cantidad de lquido para radio producer pis (la orina) de color amarillo plido, a menos que le hayan indicado lo  contrario. Consuma alimentos saludables. Consuma pequeas cantidades de alimentos con mayor frecuencia. Duerma lo suficiente. La mayora de los adultos necesitan 7 horas o ms por noche. Indicaciones generales Realice un plan de accin para la EPOC junto a su mdico. Esto lo ayudar a saber qu debe hacer si se siente peor que de costumbre. Asegrese de recibir todas las vacunas que el mdico le recomiende. Consulte al wps resources vacunas para la gripe y la neumona. Si necesita oxigenoterapia en su casa, pregntele al mdico con qu frecuencia debe controlar su nivel de oxgeno con un dispositivo llamado "oxmetro". Concurra a todas las visitas de seguimiento para revisar su plan de accin para la EPOC. El mdico querr controlar su afeccin con frecuencia para network engineer sano y fuera del hospital. Comunquese con un mdico si: Tose y elimina ms mucosidad que lo habitual. Hay un cambio en el color o en la consistencia de la mucosidad. Respirar le resulta ms difcil de  lo habitual, o le falta el aire mientras est en reposo. Debe usar therapist, nutritional de alivio rpido con ms frecuencia. Tiene dificultad para xcel energy cotidianas, como vestirse o caminar por la casa. La piel o las uas se le ponen de edison international. Tiene fiebre o escalofros. Solicite ayuda de inmediato si: Le falta el aire y no puede hacer lo siguiente: Expresarse con oraciones completas. Hacer actividades normales. Tiene dolor en el pecho. Se siente confundido. Estos sntomas pueden customer service manager. Llame al 911 de inmediato. No espere a ver si los sntomas desaparecen. No conduzca por sus propios medios officemax incorporated. Esta informacin no tiene theme park manager el consejo del mdico. Asegrese de hacerle al mdico cualquier pregunta que tenga. Document Revised: 10/19/2022 Document Reviewed: 10/19/2022 Elsevier Patient Education  2024 Elsevier Inc.     Emil Schaumann, MD [place]  [facility] at [facility]

## 2024-05-23 NOTE — Assessment & Plan Note (Signed)
 Stable without recent anginal episodes Continue daily baby aspirin and atorvastatin 20 mg daily

## 2024-05-23 NOTE — Assessment & Plan Note (Signed)
 Chronic stable conditions Continue atorvastatin  20 mg daily Diet and nutrition discussed

## 2024-05-23 NOTE — Assessment & Plan Note (Signed)
 Clinically stable. Recently evaluated by pulmonary Dr. Pulmonary function testing done recently Sleep apnea test done 3 days ago Continues Breztri  2 inhalations twice a day and albuterol  inhaler as needed

## 2024-05-26 NOTE — Procedures (Signed)
 Darryle Law [facility] [address] [place], [place] [address] Tel: [phone]   Fax: [phone]  Polysomnography Interpretation  Patient Name:  Duane Cuevas, Duane Cuevas Date:  05/20/2024 Referring Physician:  DEDRA SANDERS ([id number]) %%startinterp%% Indications for Polysomnography The patient is a 68 year-old Male who is 5' 8 and weighs 168.0 lbs. His BMI equals 25.8.  A full night polysomnogram was performed to evaluate for -OSA.  Medication  NO MEDICATIONS TAKEN.   Polysomnogram Data A full night polysomnogram recorded the standard physiologic parameters including EEG, EOG, EMG, EKG, nasal and oral airflow.  Respiratory parameters of chest and abdominal movements were recorded with Respiratory Inductance Plethysmography belts.  Oxygen  saturation was recorded by pulse oximetry.   Sleep Architecture The total recording time of the polysomnogram was 444.6 minutes.  The total sleep time was 295.0 minutes.  The patient spent 3.4% of total sleep time in Stage N1, 4.9% in Stage N2, 70.5% in Stages N3, and 21.2% in REM.  Sleep latency was 41.9 minutes.  REM latency was 40.0 minutes.  Sleep Efficiency was 66.4%.  Wake after Sleep Onset time was 107.5 minutes.  Respiratory Events The polysomnogram revealed a presence of 30 obstructive, 1 central, and 3 mixed apneas resulting in an Apnea index of 6.9 events per hour.  There were 18 hypopneas (>=3% desaturation and/or arousal) resulting in an Apnea\Hypopnea Index (AHI >=3% desaturation and/or arousal) of 10.6 events per hour.  There were 13 hypopneas (>=4% desaturation) resulting in an Apnea\Hypopnea Index (AHI >=4% desaturation) of 9.6 events per hour.  There were 106 Respiratory Effort Related Arousals resulting in a RERA index of 21.6 events per hour. The Respiratory Disturbance Index is 32.1 events per hour.  The snore index was 31.5 events per hour.  Mean oxygen  saturation was 92.8%.  The lowest oxygen   saturation during sleep was 82.0%.  Time spent <=88% oxygen  saturation was 3.3 minutes (0.8%).  Limb Activity There were 95 total limb movements recorded, of this total, 95 were classified as PLMs.  PLM index was 19.3 per hour and PLM associated with Arousals index was - per hour.  Cardiac Summary The average pulse rate was 75.8 bpm.  The minimum pulse rate was 56.0 bpm while the maximum pulse rate was 112.0 bpm.  Cardiac rhythm was normal- PACs  Comments: Mild obstructive e sleep apnea, AHI (4%) 9.6/hr. Mild snoring with oxygen  desaturation to a nadir of 82%, mean 92.8%. Insufficient early events to meet protocol requirements for Split CPAP titration.  Diagnosis: Obstructive sleep apnea.   Recommendations: If conservative measures are insufficient, consider autopap 5-15, CPAP titration sleep study or a fitted oral appliance.   This study was personally reviewed and electronically signed by: NEYSA REGGY BIRCH., MD Accredited Board Certified in Sleep Medicine Date/Time: 05/26/24    11:21    %%endinterp%%   Diagnostic PSG Report  Patient Name: Duane Cuevas, Duane Cuevas Date: 05/20/2024  Date of Birth: Apr 19, 1956 Study Type: Diagnostic  Age: 68 year MRN #: [id number]  Sex: Male Interpreting Physician: NEYSA REGGY, [id number]  Height: 5' 8 Referring Physician: CARMEN GONZALEZ ([id number])  Weight: 168.0 lbs Recording Tech: Charlie George RPSGT  BMI: 25.8 Scoring Tech: Charlie George RPSGT  ESS: 11/24 Neck Size: 15.5   Study Overview  Lights Off: 09:54:21 PM  Count Index  Lights On: 05:18:54 AM Awakenings: 20 4.1  Time in Bed: 444.6 min. Arousals: 134 27.3  Total Sleep Time: 295.0 min. AHI (>=3% Desat and/or Ar.): 52 10.6   Sleep  Efficiency: 66.4% AHI (>=4% Desat): 47 9.6   Sleep Latency: 41.9 min. Limb Movements: 95 19.3  Wake After Sleep Onset: 107.5 min. Snore: 155 31.5  REM Latency from Sleep Onset: 40.0 min. Desaturations: 39 7.9     Minimum SpO2 TST: 82.0%    Sleep  Architecture  % of Time in Bed Stages Time (mins) % Sleep Time  Wake 150.0   Stage N1 10.0 3.4%  Stage N2 14.5 4.9%  Stage N3 208.0 70.5%  REM 62.5 21.2%   Arousal Summary   NREM REM Sleep Index  Respiratory Arousals 111 16 127 25.8  PLM Arousals - - - -  Isolated Limb Movement Arousals - - - -  Snore Arousals 2 1 3  0.6  Spontaneous Arousals 3 1 4  0.8  Total 116 18 134 27.3   Limb Movement Summary   Count Index  Isolated Limb Movements - -  Periodic Limb Movements (PLMs) 95 19.3  Total Limb Movements 95 19.3    Respiratory Summary   By Sleep Stage By Body Position Total   NREM REM Supine Non-Supine   Time (min) 232.5 62.5 34.5 260.5 295.0         Obstructive Apnea 16 14 18 12 30   Mixed Apnea 2 1 2 1 3   Central Apnea 1 - 1 - 1  Total Apneas 19 15 21 13  34  Total Apnea Index 4.9 14.4 36.5 3.0 6.9         Hypopneas (>=3% Desat and/or Ar.) 10 8 6 12 18   AHI (>=3% Desat and/or Ar.) 7.5 22.1 47.0 5.8 10.6         Hypopneas (>=4% Desat) 7 6 5 8 13   AHI (>=4% Desat) 6.7 20.2 45.2 4.8 9.6          RERAs 97 9 12 94 106  RERA Index 25.0 8.6 20.9 21.7 21.6         RDI 32.5 30.7 67.8 27.4 32.1    Respiratory Event Type Index  Central Apneas 0.2  Obstructive Apneas 6.1  Mixed Apneas 0.6  Central Hypopneas -  Obstructive Hypopneas 3.7  Central Apnea + Hypopnea (CAHI) 0.2  Obstructive Apnea + Hypopnea (OAHI) 10.4   Respiratory Event Durations   Apnea Hypopnea   NREM REM NREM REM  Average (seconds) 19.5 21.6 26.9 25.7  Maximum (seconds) 49.3 34.6 48.8 34.5    Oxygen  Saturation Summary   Wake NREM REM TST TIB  Average SpO2 (%) 93.5% 92.6% 92.1% 92.5% 92.8%  Minimum SpO2 (%) 91.0% 82.0% 84.0% 82.0% 82.0%  Maximum SpO2 (%) 97.0% 96.0% 96.0% 96.0% 97.0%   Oxygen  Saturation Distribution  Range (%) Time in range (min) Time in range (%)  90.0 - 100.0 433.4 98.0%  80.0 - 90.0 7.6 1.7%  70.0 - 80.0 - -  60.0 - 70.0 - -  50.0 - 60.0 - -  0.0 - 50.0 - -  Time  Spent <=88% SpO2  Range (%) Time in range (min) Time in range (%)  0.0 - 88.0 3.3 0.8%      Count Index  Desaturations 39 7.9    Cardiac Summary   Wake NREM REM Sleep Total  Average Pulse Rate (BPM) 79.9 73.5 75.1 73.8 75.8  Minimum Pulse Rate (BPM) 63.0 59.0 56.0 56.0 56.0  Maximum Pulse Rate (BPM) 112.0 105.0 93.0 105.0 112.0   Pulse Rate Distribution:  Range (bpm) Time in range (min) Time in range (%)  0.0 - 40.0 - -  40.0 - 60.0  0.8 0.2%  60.0 - 80.0 356.2 80.5%  80.0 - 100.0 83.5 18.9%  100.0 - 120.0 1.0 0.2%  120.0 - 140.0 - -  140.0 - 200.0 - -      Hypnograms                      Technologist Comments            The patient arrived for a split night study. The patient was placed in room 6. Due to the language barrier, an interpreter was provided prior to hook up to explain the procedure and to help the patient ask and answer questions. The procedure was explained, and the patient had no questions.           The patient was fitted with a medium F&P Vitera full-face mask and trialed CPAP at a pressure of 6 cm H2O prior to the start of the study. The patient had no issues or comments with the PAP trial. The patient did not have a high enough AHI to meet split night criteria.            The patient slept in the left, right, and supine positions. Oral venting and moderate PLMs were observed. No bruxism, seizure, or spike wave activity was observed. The patient had mild snoring that was not audible. PACs were seen throughout the night. All stages of sleep were recorded. The patient had one-bathroom break.                           Reggy Salt Diplomate, Biomedical Engineer of Sleep Medicine  ELECTRONICALLY SIGNED ON:  05/26/2024, 11:11 AM [facility] PH: [phone]   FX: [phone] ACCREDITED BY THE AMERICAN ACADEMY OF SLEEP MEDICINE

## 2024-05-26 NOTE — Procedures (Signed)
  Indications for Polysomnography The patient is a 68 year-old Male who is 5' 8 and weighs 168.0 lbs. His BMI equals 25.8.  A full night polysomnogram was performed to evaluate for -.  MedicationNO MEDICATIONS TAKEN. Polysomnogram Data A full night polysomnogram recorded the standard physiologic parameters including EEG, EOG, EMG, EKG, nasal and oral airflow.  Respiratory parameters of chest and abdominal movements were recorded with Respiratory Inductance Plethysmography belts.   Oxygen  saturation was recorded by pulse oximetry.  Sleep Architecture The total recording time of the polysomnogram was 444.6 minutes.  The total sleep time was 295.0 minutes.  The patient spent 3.4% of total sleep time in Stage N1, 4.9% in Stage N2, 70.5% in Stages N3, and 21.2% in REM.  Sleep latency was 41.9 minutes.   REM latency was 40.0 minutes.  Sleep Efficiency was 66.4%.  Wake after Sleep Onset time was 107.5 minutes.  Respiratory Events The polysomnogram revealed a presence of 30 obstructive, 1 central, and 3 mixed apneas resulting in an Apnea index of 6.9 events per hour.  There were 18 hypopneas (GreaterEqual to3% desaturation and/or arousal) resulting in an Apnea\Hypopnea Index (AHI  GreaterEqual to3% desaturation and/or arousal) of 10.6 events per hour.  There were 13 hypopneas (GreaterEqual to4% desaturation) resulting in an Apnea\Hypopnea Index (AHI GreaterEqual to4% desaturation) of 9.6 events per hour.  There were 106  Respiratory Effort Related Arousals resulting in a RERA index of 21.6 events per hour. The Respiratory Disturbance Index is 32.1 events per hour.  The snore index was 31.5 events per hour.  Mean oxygen  saturation was 92.8%.  The lowest oxygen  saturation during sleep was 82.0%.  Time spent LessEqual to88% oxygen  saturation was  minutes ().  Limb Activity There were 95 total limb movements recorded, of this total, 95 were classified as PLMs.  PLM index was 19.3 per hour and PLM associated  with Arousals index was - per hour.  Cardiac Summary The average pulse rate was 75.8 bpm.  The minimum pulse rate was 56.0 bpm while the maximum pulse rate was 112.0 bpm.  Cardiac rhythm was normal/abnormal.  Comments:  Diagnosis:  Recommendations:   This study was personally reviewed and electronically signed by: NEYSA REGGY BIRCH., MD Accredited Board Certified in Sleep Medicine Date/Time:

## 2024-05-27 ENCOUNTER — Ambulatory Visit: Payer: Self-pay | Admitting: Pulmonary Disease

## 2024-05-27 DIAGNOSIS — R0602 Shortness of breath: Secondary | ICD-10-CM

## 2024-05-27 DIAGNOSIS — J449 Chronic obstructive pulmonary disease, unspecified: Secondary | ICD-10-CM

## 2024-05-27 NOTE — Progress Notes (Signed)
 The report has no final impressions nor recommendations.

## 2024-05-28 ENCOUNTER — Encounter (HOSPITAL_COMMUNITY)
Admission: RE | Admit: 2024-05-28 | Discharge: 2024-05-28 | Disposition: A | Source: Ambulatory Visit | Attending: Pulmonary Disease

## 2024-05-28 VITALS — Wt 166.9 lb

## 2024-05-28 DIAGNOSIS — J449 Chronic obstructive pulmonary disease, unspecified: Secondary | ICD-10-CM | POA: Diagnosis not present

## 2024-05-28 NOTE — Progress Notes (Signed)
 Daily Session Note  Patient Details  Name: Duane Cuevas MRN: [id number] Date of Birth: 06-13-1956 Referring Provider:   [name] Pulmonary Rehab Walk Test from 04/01/2024 in [facility]  Referring Provider Tamea    Encounter Date: 05/28/2024  Check In:  Session Check In - 05/28/24 1132       Check-In   Supervising physician immediately available to respond to emergencies [facility] MD immediately available    Physician(s) Rosabel Mose, NP    Location [facility]    Staff Present Augustin Sharps, Neita Moats, MS, ACSM-CEP, Exercise Physiologist;Mary Harvy, RN, BSN;Randi Reeve BS, ACSM-CEP, Exercise Physiologist    Virtual Visit No    Medication changes reported     No    Fall or balance concerns reported    No    Tobacco Cessation No Change    Warm-up and Cool-down Performed as group-led instruction    Resistance Training Performed Yes    VAD Patient? No    PAD/SET Patient? No      Pain Assessment   Currently in Pain? No/denies    Multiple Pain Sites No          Capillary Blood Glucose: No results found for this or any previous visit (from the past 24 hours).   Exercise Prescription Changes - 05/28/24 1200       Response to Exercise   Blood Pressure (Admit) 100/56    Blood Pressure (Exercise) 140/72    Blood Pressure (Exit) 118/70    Heart Rate (Admit) 104 bpm    Heart Rate (Exercise) 121 bpm    Heart Rate (Exit) 102 bpm    Oxygen  Saturation (Admit) 95 %    Oxygen  Saturation (Exercise) 92 %    Oxygen  Saturation (Exit) 93 %    Rating of Perceived Exertion (Exercise) 11    Perceived Dyspnea (Exercise) 1    Duration Continue with 30 min of aerobic exercise without signs/symptoms of physical distress.    Intensity THRR unchanged      Progression   Progression Continue to progress workloads to maintain intensity without signs/symptoms of physical distress.      Resistance Training    Training Prescription Yes    Weight black bands    Reps 10-15    Time 10 Minutes      Treadmill   MPH 2.7    Grade 2.5    Minutes 15    METs 3.7      Recumbant Elliptical   Level 4    RPM 51    Watts 66    Minutes 15    METs 3.2          Social History   Tobacco Use  Smoking Status Every Day   Current packs/day: 1.00   Average packs/day: 1 pack/day for 53.9 years (53.9 ttl pk-yrs)   Types: Cigarettes   Start date: 67  Smokeless Tobacco Never  Tobacco Comments   02-27-2023  per pt daughter pt is still smoking, but hides how much per day,  smoking since age 34 (37)    Goals Met:  Proper associated with RPD/PD & O2 Sat Independence with exercise equipment Exercise tolerated well No report of concerns or symptoms today Strength training completed today  Goals Unmet:  Not Applicable  Comments: Service time is from 1016 to 1135.    Dr. Slater Staff is Medical Director for Pulmonary Rehab at [facility].

## 2024-05-29 NOTE — Progress Notes (Signed)
 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 TOMAR 1 TABLETA POR VAI ORAL UNA VEZ AL DIA, Disp: 90 tablet, Rfl: 3   aspirin  EC 81 MG tablet, Take 1 tablet (81 mg total) by mouth daily. Swallow whole., Disp: 30 tablet, Rfl: 0   atorvastatin  (LIPITOR) 20 MG tablet, Take 1 tablet (20 mg total) by mouth daily. TOMAR 1 TABLETA POR VIA ORAL UNA VEZ AL DIA, Disp: 90 tablet, Rfl: 3   budesonide -glycopyrrolate-formoterol  (BREZTRI  AEROSPHERE) 160-9-4.8 MCG/ACT AERO inhaler, INHALE TWO PUFFS BY MOUTH TWICE A DAY, Disp: 10.7 g, Rfl: 10   Continuous Glucose Sensor (FREESTYLE LIBRE 14 DAY SENSOR) MISC, APPLY ONE SENSOR TO THE BACK OF YOUR UPPER ARM. REPLACE EVERY 14 DAYS., Disp: 1 each, Rfl: 7   cyclobenzaprine  (FLEXERIL ) 10 MG tablet, Take 1 tablet (10 mg total) by mouth 2 (two) times daily as needed for muscle spasms., Disp: 20 tablet, Rfl: 0   Dulaglutide  (TRULICITY ) 1.5 MG/0.5ML SOAJ, INYECTAR CONTENIDOS DE UN LAPIZ POR  VIA SUBCUTANEA CADA SEMANA, EL MISMO DIA CADA SEMANA, Disp: 2 mL, Rfl: 10   FARXIGA  10 MG TABS tablet, TOMAR 1 TABLETA POR VIA ORAL UNA VEZ AL DIA, Disp: 90 tablet, Rfl: 3   gabapentin  (NEURONTIN ) 300 MG capsule, Take 1 capsule (300 mg total) by mouth at bedtime as needed., Disp: 60 capsule, Rfl: 0   glipiZIDE  (GLUCOTROL ) 10 MG tablet, TOMAR 1 TABLETA POR VIA ORAL UNA VEZ AL DIA ANTES DEL DESAYUNO *TOMAR 1 TABLETA ADICIONAL SI AZUCAR EN LA SANGRE ESTA ALTA*, Disp: 90 tablet, Rfl: 3   glucose blood test strip, Use as instructed, Disp: 100 each, Rfl: 12   guaifenesin  (ROBITUSSIN) 100 MG/5ML syrup, Take 200 mg by mouth 3 (three) times daily as needed for cough., Disp: , Rfl:    ibuprofen  (ADVIL ) 800 MG tablet, Take 1 tablet (800 mg total) by mouth every 8 (eight) hours as needed., Disp: 30 tablet, Rfl: 0   insulin  aspart (NOVOLOG  FLEXPEN) 100 UNIT/ML FlexPen, INYECTAR 3 UNIDADES POR VIA SUBCUTANEA TRES VECES AL DIA COMO INDICADO. AJUSTAR CANTIDAD DE INSULINA POR ESCALA MOVIL. MAS DOSIS 30 UNIDADES, Disp: 15 mL, Rfl: 10   mupirocin  ointment (BACTROBAN ) 2 %, Apply 1 Application topically daily. 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  Follow discharge instructions on paperwork for duration (Patient not taking: Reported on 04/01/2024), Disp: 22 g, Rfl: 0   pantoprazole  (PROTONIX ) 40 MG tablet, Take 1 tablet (40 mg total) by mouth daily. TOMAR 1 TABLETA POR VIA ORAL UNA VEZ  AL DIA, Disp: 90 tablet, Rfl: 3   predniSONE  (DELTASONE ) 10 MG tablet, Take 4 tabs daily for 3 days, then 3 tabs daily for 3 days, then 2 tabs daily for 3 days, then 1 tab daily for 3 days, then 1/2 tab daily for 4 days. (Patient not taking: Reported on 04/01/2024), Disp: 32 tablet, Rfl: 0   sildenafil  (VIAGRA ) 100 MG tablet, Take 0.5-1 tablets (50-100 mg total) by mouth daily as needed for erectile dysfunction. (Patient not taking: Reported on 04/01/2024), Disp: 5 tablet, Rfl: 11   TRESIBA  FLEXTOUCH 100 UNIT/ML FlexTouch Pen,  Inject 30 Units into the skin daily., Disp: 0.3 mL, Rfl: 7 No current facility-administered medications for this encounter.  Facility-Administered Medications Ordered in Other Encounters:    morphine  2 MG/ML injection, , , ,   Past Medical History: Past Medical History:  Diagnosis Date   Cerebral aneurysm, nonruptured    followed by dr n. 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   Coronary artery disease    (pt admitted for chest pain ) nuclear stress test 11-02-2019  low risk, nuclear ef 58%,  prior inferior defect of severe severity indicative of previous MI;;   echo 04-28-2020 ef 60-65%, mild concentric LVH, RSVP 54.31mmHg;;   cardiac CTA 09-15-2020  unremarkable,  calcium  score=zero   Dependence on nocturnal oxygen  therapy    02-27-2023  per pt daughter,  pt only uses at night w/ O2 sat ,2-3L via [place],  only uses portable if going outside when every hot which is not much   Diverticulosis of colon    DOE (dyspnea on exertion)    02-27-2023  per pt daugter sob w/ stairs, incline's,  ok w/ some yard work if not warm/ hot wearther,  can do house hold chores   ED (erectile dysfunction)    Emphysema/COPD    followed by pcp;   uses breztri  bid, uses preventil nebulizer every afternoon;   last exacerbation 06/ 2023 in epic   Full dentures    GERD (gastroesophageal reflux disease)    Hepatic steatosis    History of adenomatous polyp of colon    History of gastric ulcer    per EGD 09-16-2020  non-bleeding   History of MI (myocardial infarction)    per nuclear stress test in epic 11-02-2019 showed prior inferior defect of severe severity indicative of previous MI   History of pelvic fracture    1999  and 2009   Hyperlipidemia    Hypertension    Smokers' cough (HCC)    Type 2 diabetes mellitus treated with insulin  (HCC)    followed by pcp;  dx 2016   (02-27-2023  per pt daughter pt   Wears glasses      Tobacco Use: Social History   Tobacco Use  Smoking Status Every Day   Current packs/day: 1.00   Average packs/day: 1 pack/day for 53.9 years (53.9 ttl pk-yrs)   Types: Cigarettes   Start date: 41  Smokeless Tobacco Never  Tobacco Comments   02-27-2023  per pt daughter pt is still smoking, but hides how much per day,  smoking since age 56 (1)    Labs: Review Flowsheet  More data exists      Latest Ref Rng & Units 12/29/2022 03/09/2023 08/02/2023 12/14/2023 02/14/2024  Labs for ITP Cardiac and Pulmonary Rehab  Cholestrol 0 - 200 mg/dL 848  - - - 873   LDL (calc) 0 - 99  mg/dL 80  - - - 63   HDL-C >60.99 mg/dL 42.39  - - - 56.29   Trlycerides 0.0 - 149.0 mg/dL 29.9  - - - 07.9   Hemoglobin A1c 4.8 - 5.6 % 6.6  7.5  6.7  7.2  -    Capillary Blood Glucose: Lab Results  Component Value Date   GLUCAP 299 (H) 05/09/2024   GLUCAP 327 (H) 05/07/2024   GLUCAP 117 (H) 04/18/2024   GLUCAP 149 (H) 04/18/2024   GLUCAP 216 (H) 04/16/2024     Pulmonary Assessment Scores:  Pulmonary Assessment Scores     Row Name 04/01/24 1109         ADL UCSD   ADL Phase Entry     SOB Score total 88       CAT Score   CAT Score 22       mMRC Score   mMRC Score 4       UCSD: Self-administered rating of dyspnea associated with activities of daily living (ADLs) 6-point scale (0 = not at all to 5 = maximal or unable to do because of breathlessness)  Scoring Scores range from 0 to 120.  Minimally important difference is 5 units  CAT: CAT can identify the health impairment of COPD patients and is better correlated with disease progression.  CAT has a scoring range of zero to 40. The CAT score is classified into four groups of low (less than 10), medium (10 - 20), high (21-30) and very high (31-40) based on the impact level of disease on health status. A CAT score over 10 suggests significant symptoms.  A worsening CAT score could be explained by an exacerbation, poor medication  adherence, poor inhaler technique, or progression of COPD or comorbid conditions.  CAT MCID is 2 points  mMRC: mMRC (Modified Medical Research Council) Dyspnea Scale is used to assess the degree of baseline functional disability in patients of respiratory disease due to dyspnea. No minimal important difference is established. A decrease in score of 1 point or greater is considered a positive change.   Pulmonary Function Assessment:  Pulmonary Function Assessment - 04/01/24 1109       Breath   Bilateral Breath Sounds Decreased;Wheezes    Shortness of Breath Yes;Limiting activity;Fear of Shortness of Breath;Panic with Shortness of Breath          Exercise Target Goals: Exercise Program Goal: Individual exercise prescription set using results from initial 6 min walk test and THRR while considering  patient's activity barriers and safety.   Exercise Prescription Goal: Initial exercise prescription builds to 30-45 minutes a day of aerobic activity, 2-3 days per week.  Home exercise guidelines will be given to patient during program as part of exercise prescription that the participant will acknowledge.  Activity Barriers & Risk Stratification:  Activity Barriers & Cardiac Risk Stratification - 04/01/24 1112       Activity Barriers & Cardiac Risk Stratification   Activity Barriers Deconditioning;Muscular Weakness;Shortness of Breath;History of Falls;Back Problems          6 Minute Walk:  6 Minute Walk     Row Name 04/01/24 1202         6 Minute Walk   Phase Initial     Distance 1125 feet     Walk Time 6 minutes     # of Rest Breaks 0     MPH 2.13     METS 2.92     RPE 13  Perceived Dyspnea  3     VO2 Peak 10.22     Symptoms No     Resting HR 86 bpm     Resting BP 112/60     Resting Oxygen  Saturation  95 %     Exercise Oxygen  Saturation  during 6 min walk 90 %     Max Ex. HR 111 bpm     Max Ex. BP 126/64     2 Minute Post BP 108/60       Interval HR   1  Minute HR 107     2 Minute HR 104     3 Minute HR 101     4 Minute HR 103     5 Minute HR 111     6 Minute HR 108     2 Minute Post HR 90     Interval Heart Rate? Yes       Interval Oxygen    Interval Oxygen ? Yes     Baseline Oxygen  Saturation % 95 %     1 Minute Oxygen  Saturation % 94 %     1 Minute Liters of Oxygen  0 L     2 Minute Oxygen  Saturation % 93 %     2 Minute Liters of Oxygen  0 L     3 Minute Oxygen  Saturation % 92 %     3 Minute Liters of Oxygen  0 L     4 Minute Oxygen  Saturation % 91 %     4 Minute Liters of Oxygen  0 L     5 Minute Oxygen  Saturation % 91 %     5 Minute Liters of Oxygen  0 L     6 Minute Oxygen  Saturation % 90 %     6 Minute Liters of Oxygen  0 L     2 Minute Post Oxygen  Saturation % 97 %     2 Minute Post Liters of Oxygen  0 L        Oxygen  Initial Assessment:  Oxygen  Initial Assessment - 04/01/24 1051       Home Oxygen    Home Oxygen  Device Home Concentrator    Sleep Oxygen  Prescription Continuous    Liters per minute 2.5    Home Exercise Oxygen  Prescription None    Home Resting Oxygen  Prescription None    Compliance with Home Oxygen  Use Yes      Initial 6 min Walk   Oxygen  Used None      Program Oxygen  Prescription   Program Oxygen  Prescription None      Intervention   Short Term Goals To learn and exhibit compliance with exercise, home and travel O2 prescription;To learn and understand importance of maintaining oxygen  saturations>88%;To learn and demonstrate proper use of respiratory medications;To learn and understand importance of monitoring SPO2 with pulse oximeter and demonstrate accurate use of the pulse oximeter.;To learn and demonstrate proper pursed lip breathing techniques or other breathing techniques.     Long  Term Goals Exhibits compliance with exercise, home  and travel O2 prescription;Maintenance of O2 saturations>88%;Compliance with respiratory medication;Verbalizes importance of monitoring SPO2 with pulse oximeter and  return demonstration;Exhibits proper breathing techniques, such as pursed lip breathing or other method taught during program session;Demonstrates proper use of MDI's          Oxygen  Re-Evaluation:  Oxygen  Re-Evaluation     Row Name 04/02/24 0759 04/19/24 0940 05/24/24 0850         Program Oxygen  Prescription   Program Oxygen  Prescription None None None  Home Oxygen    Home Oxygen  Device Home Concentrator Home Concentrator Home Concentrator     Sleep Oxygen  Prescription Continuous Continuous Continuous     Liters per minute 2.5 2.5 2.5     Home Exercise Oxygen  Prescription None None None     Home Resting Oxygen  Prescription None None None     Compliance with Home Oxygen  Use Yes Yes Yes       Goals/Expected Outcomes   Short Term Goals To learn and exhibit compliance with exercise, home and travel O2 prescription;To learn and understand importance of maintaining oxygen  saturations>88%;To learn and demonstrate proper use of respiratory medications;To learn and understand importance of monitoring SPO2 with pulse oximeter and demonstrate accurate use of the pulse oximeter.;To learn and demonstrate proper pursed lip breathing techniques or other breathing techniques.  To learn and exhibit compliance with exercise, home and travel O2 prescription;To learn and understand importance of maintaining oxygen  saturations>88%;To learn and demonstrate proper use of respiratory medications;To learn and understand importance of monitoring SPO2 with pulse oximeter and demonstrate accurate use of the pulse oximeter.;To learn and demonstrate proper pursed lip breathing techniques or other breathing techniques.  To learn and exhibit compliance with exercise, home and travel O2 prescription;To learn and understand importance of maintaining oxygen  saturations>88%;To learn and demonstrate proper use of respiratory medications;To learn and understand importance of monitoring SPO2 with pulse oximeter and  demonstrate accurate use of the pulse oximeter.;To learn and demonstrate proper pursed lip breathing techniques or other breathing techniques.      Long  Term Goals Exhibits compliance with exercise, home  and travel O2 prescription;Maintenance of O2 saturations>88%;Compliance with respiratory medication;Verbalizes importance of monitoring SPO2 with pulse oximeter and return demonstration;Exhibits proper breathing techniques, such as pursed lip breathing or other method taught during program session;Demonstrates proper use of MDI's Exhibits compliance with exercise, home  and travel O2 prescription;Maintenance of O2 saturations>88%;Compliance with respiratory medication;Verbalizes importance of monitoring SPO2 with pulse oximeter and return demonstration;Exhibits proper breathing techniques, such as pursed lip breathing or other method taught during program session;Demonstrates proper use of MDI's Exhibits compliance with exercise, home  and travel O2 prescription;Maintenance of O2 saturations>88%;Compliance with respiratory medication;Verbalizes importance of monitoring SPO2 with pulse oximeter and return demonstration;Exhibits proper breathing techniques, such as pursed lip breathing or other method taught during program session;Demonstrates proper use of MDI's     Goals/Expected Outcomes Compliance and understanding of oxygen  saturation monitoring and breathing techniques to decrease shortness of breath. Compliance and understanding of oxygen  saturation monitoring and breathing techniques to decrease shortness of breath. Compliance and understanding of oxygen  saturation monitoring and breathing techniques to decrease shortness of breath.        Oxygen  Discharge (Final Oxygen  Re-Evaluation):  Oxygen  Re-Evaluation - 05/24/24 0850       Program Oxygen  Prescription   Program Oxygen  Prescription None      Home Oxygen    Home Oxygen  Device Home Concentrator    Sleep Oxygen  Prescription Continuous     Liters per minute 2.5    Home Exercise Oxygen  Prescription None    Home Resting Oxygen  Prescription None    Compliance with Home Oxygen  Use Yes      Goals/Expected Outcomes   Short Term Goals To learn and exhibit compliance with exercise, home and travel O2 prescription;To learn and understand importance of maintaining oxygen  saturations>88%;To learn and demonstrate proper use of respiratory medications;To learn and understand importance of monitoring SPO2 with pulse oximeter and demonstrate accurate use of the pulse oximeter.;To learn and demonstrate  proper pursed lip breathing techniques or other breathing techniques.     Long  Term Goals Exhibits compliance with exercise, home  and travel O2 prescription;Maintenance of O2 saturations>88%;Compliance with respiratory medication;Verbalizes importance of monitoring SPO2 with pulse oximeter and return demonstration;Exhibits proper breathing techniques, such as pursed lip breathing or other method taught during program session;Demonstrates proper use of MDI's    Goals/Expected Outcomes Compliance and understanding of oxygen  saturation monitoring and breathing techniques to decrease shortness of breath.          Initial Exercise Prescription:  Initial Exercise Prescription - 04/01/24 1200       Date of Initial Exercise RX and Referring Provider   Date 04/01/24    Referring Provider Tamea    Expected Discharge Date 07/09/24      Treadmill   MPH 1.4    Grade 0    Minutes 15      Recumbant Elliptical   Level 1    RPM 60    Watts 80    Minutes 15    METs 2      Prescription Details   Frequency (times per week) 2    Duration Progress to 30 minutes of continuous aerobic without signs/symptoms of physical distress      Intensity   THRR 40-80% of Max Heartrate 61-122    Ratings of Perceived Exertion 11-13    Perceived Dyspnea 0-4      Progression   Progression Continue to progress workloads to maintain intensity without  signs/symptoms of physical distress.      Resistance Training   Training Prescription Yes    Weight blue bands    Reps 10-15          Perform Capillary Blood Glucose checks as needed.  Exercise Prescription Changes:   Exercise Prescription Changes     Row Name 04/16/24 1200 04/30/24 1200 05/14/24 1100 05/28/24 1200       Response to Exercise   Blood Pressure (Admit) 100/64 116/60 118/60 100/56    Blood Pressure (Exercise) 132/70 154/84 136/62 140/72    Blood Pressure (Exit) 112/66 118/68 110/62 118/70    Heart Rate (Admit) 97 bpm 97 bpm 100 bpm 104 bpm    Heart Rate (Exercise) 122 bpm 132 bpm 114 bpm 121 bpm    Heart Rate (Exit) 104 bpm 104 bpm 101 bpm 102 bpm    Oxygen  Saturation (Admit) 95 % 97 % 95 % 95 %    Oxygen  Saturation (Exercise) 93 % 92 % 93 % 92 %    Oxygen  Saturation (Exit) 94 % 95 % 95 % 93 %    Rating of Perceived Exertion (Exercise) 11 11 12 11     Perceived Dyspnea (Exercise) 1 1 1 1     Duration Continue with 30 min of aerobic exercise without signs/symptoms of physical distress. Continue with 30 min of aerobic exercise without signs/symptoms of physical distress. Continue with 30 min of aerobic exercise without signs/symptoms of physical distress. Continue with 30 min of aerobic exercise without signs/symptoms of physical distress.    Intensity THRR unchanged THRR unchanged THRR unchanged THRR unchanged      Progression   Progression Continue to progress workloads to maintain intensity without signs/symptoms of physical distress. Continue to progress workloads to maintain intensity without signs/symptoms of physical distress. Continue to progress workloads to maintain intensity without signs/symptoms of physical distress. Continue to progress workloads to maintain intensity without signs/symptoms of physical distress.      Paramedic  Prescription Yes Yes Yes Yes    Weight blue bands blue bands black bands black bands    Reps 10-15 10-15  10-15 10-15    Time 10 Minutes 10 Minutes 10 Minutes 10 Minutes      Treadmill   MPH 2 2.3 2.3 2.7    Grade 0 2 2 2.5    Minutes 15 15 15 15     METs 2.4 3.2 3.2 3.7      Recumbant Elliptical   Level 2 3 4 4     RPM -- -- 45 51    Watts -- -- -- 66    Minutes 15 15 15 15     METs 4 4.3 3.2 3.2       Exercise Comments:   Exercise Goals and Review:   Exercise Goals     Row Name 04/01/24 1050             Exercise Goals   Increase Physical Activity Yes       Intervention Provide advice, education, support and counseling about physical activity/exercise needs.;Develop an individualized exercise prescription for aerobic and resistive training based on initial evaluation findings, risk stratification, comorbidities and participant's personal goals.       Expected Outcomes Short Term: Attend rehab on a regular basis to increase amount of physical activity.;Long Term: Add in home exercise to make exercise part of routine and to increase amount of physical activity.;Long Term: Exercising regularly at least 3-5 days a week.       Increase Strength and Stamina Yes       Intervention Provide advice, education, support and counseling about physical activity/exercise needs.;Develop an individualized exercise prescription for aerobic and resistive training based on initial evaluation findings, risk stratification, comorbidities and participant's personal goals.       Expected Outcomes Short Term: Increase workloads from initial exercise prescription for resistance, speed, and METs.;Short Term: Perform resistance training exercises routinely during rehab and add in resistance training at home;Long Term: Improve cardiorespiratory fitness, muscular endurance and strength as measured by increased METs and functional capacity ( )       Able to understand and use rate of perceived exertion (RPE) scale Yes       Intervention Provide education and explanation on how to use RPE scale       Expected  Outcomes Short Term: Able to use RPE daily in rehab to express subjective intensity level;Long Term:  Able to use RPE to guide intensity level when exercising independently       Able to understand and use Dyspnea scale Yes       Intervention Provide education and explanation on how to use Dyspnea scale       Expected Outcomes Short Term: Able to use Dyspnea scale daily in rehab to express subjective sense of shortness of breath during exertion;Long Term: Able to use Dyspnea scale to guide intensity level when exercising independently       Knowledge and understanding of Target Heart Rate Range (THRR) Yes       Intervention Provide education and explanation of THRR including how the numbers were predicted and where they are located for reference       Expected Outcomes Short Term: Able to state/look up THRR;Long Term: Able to use THRR to govern intensity when exercising independently;Short Term: Able to use daily as guideline for intensity in rehab       Understanding of Exercise Prescription Yes       Intervention Provide education, explanation, and  written materials on patient's individual exercise prescription       Expected Outcomes Short Term: Able to explain program exercise prescription;Long Term: Able to explain home exercise prescription to exercise independently          Exercise Goals Re-Evaluation :  Exercise Goals Re-Evaluation     Row Name 04/02/24 0750 04/19/24 0938 05/24/24 0848         Exercise Goal Re-Evaluation   Exercise Goals Review Increase Physical Activity;Able to understand and use Dyspnea scale;Understanding of Exercise Prescription;Increase Strength and Stamina;Knowledge and understanding of Target Heart Rate Range (THRR);Able to understand and use rate of perceived exertion (RPE) scale Increase Physical Activity;Able to understand and use Dyspnea scale;Understanding of Exercise Prescription;Increase Strength and Stamina;Knowledge and understanding of Target Heart Rate  Range (THRR);Able to understand and use rate of perceived exertion (RPE) scale Increase Physical Activity;Able to understand and use Dyspnea scale;Understanding of Exercise Prescription;Increase Strength and Stamina;Knowledge and understanding of Target Heart Rate Range (THRR);Able to understand and use rate of perceived exertion (RPE) scale     Comments Thien is scheduled to begin exercise on 10/7. Will continue to monitor and progress as able. Dannel has completed 4 exercise sessions. He exercises for 15 min on the Octane and treadmill. He averages 3.4 METs at level 3 on the Octane and 2.6 METs at 2 mph and 1% incline on the treadmill. Kong performs the warmup and cooldown standing without limitations. He has increased his level on the Octane as METs have increased. His speed and incline have increased on the treadmil. Jett is making good progress thus far. Will continue to monitor and progress as able. Quamere has completed 14 exercise sessions. He exercises for 15 min on the Octane and treadmill. He averages 4.3 METs at level 4 on the Octane and 3.7 METs at 2.7 mph and 2% incline on the treadmill. Cosme performs the warmup and cooldown standing without limitations. He continues to increase his level on the Octane and speed and incline on the treadmill. Keeven seems motivated to exercise. Will continue to monitor and progress as able.     Expected Outcomes Through exercise at rehab and home, the patient will decrease shortness of breath with daily activities and feel confident in carrying out an exercise regimen at home. Through exercise at rehab and home, the patient will decrease shortness of breath with daily activities and feel confident in carrying out an exercise regimen at home. Through exercise at rehab and home, the patient will decrease shortness of breath with daily activities and feel confident in carrying out an exercise regimen at home.        Discharge Exercise Prescription (Final Exercise  Prescription Changes):  Exercise Prescription Changes - 05/28/24 1200       Response to Exercise   Blood Pressure (Admit) 100/56    Blood Pressure (Exercise) 140/72    Blood Pressure (Exit) 118/70    Heart Rate (Admit) 104 bpm    Heart Rate (Exercise) 121 bpm    Heart Rate (Exit) 102 bpm    Oxygen  Saturation (Admit) 95 %    Oxygen  Saturation (Exercise) 92 %    Oxygen  Saturation (Exit) 93 %    Rating of Perceived Exertion (Exercise) 11    Perceived Dyspnea (Exercise) 1    Duration Continue with 30 min of aerobic exercise without signs/symptoms of physical distress.    Intensity THRR unchanged      Progression   Progression Continue to progress workloads to maintain intensity without signs/symptoms  of physical distress.      Resistance Training   Training Prescription Yes    Weight black bands    Reps 10-15    Time 10 Minutes      Treadmill   MPH 2.7    Grade 2.5    Minutes 15    METs 3.7      Recumbant Elliptical   Level 4    RPM 51    Watts 66    Minutes 15    METs 3.2          Nutrition:  Target Goals: Understanding of nutrition guidelines, daily intake of sodium 1500mg , cholesterol 200mg , calories 30% from fat and 7% or less from saturated fats, daily to have 5 or more servings of fruits and vegetables.  Biometrics:  Pre Biometrics - 04/01/24 1048       Pre Biometrics   Grip Strength 26 kg           Nutrition Therapy Plan and Nutrition Goals:   Nutrition Assessments:  MEDIFICTS Score Key: >=70 Need to make dietary changes  40-70 Heart Healthy Diet <= 40 Therapeutic Level Cholesterol Diet   Picture Your Plate Scores: <59 Unhealthy dietary pattern with much room for improvement. 41-50 Dietary pattern unlikely to meet recommendations for good health and room for improvement. 51-60 More healthful dietary pattern, with some room for improvement.  >60 Healthy dietary pattern, although there may be some specific behaviors that could be improved.     Nutrition Goals Re-Evaluation:   Nutrition Goals Discharge (Final Nutrition Goals Re-Evaluation):   Psychosocial: Target Goals: Acknowledge presence or absence of significant depression and/or stress, maximize coping skills, provide positive support system. Participant is able to verbalize types and ability to use techniques and skills needed for reducing stress and depression.  Initial Review & Psychosocial Screening:  Initial Psych Review & Screening - 04/01/24 1052       Initial Review   Current issues with None Identified      Family Dynamics   Good Support System? Yes    Comments spouse and daughter      Barriers   Psychosocial barriers to participate in program There are no identifiable barriers or psychosocial needs.      Screening Interventions   Interventions Encouraged to exercise          Quality of Life Scores:  Scores of 19 and below usually indicate a poorer quality of life in these areas.  A difference of  2-3 points is a clinically meaningful difference.  A difference of 2-3 points in the total score of the Quality of Life Index has been associated with significant improvement in overall quality of life, self-image, physical symptoms, and general health in studies assessing change in quality of life.  PHQ-9: Review Flowsheet  More data exists      04/01/2024 01/11/2024 12/21/2023 10/24/2023 10/03/2023  Depression screen PHQ 2/9  Decreased Interest 0 0 0 0 0  Down, Depressed, Hopeless 0 0 0 0 -  PHQ - 2 Score 0 0 0 0 0  Altered sleeping 0 0 - - -  Tired, decreased energy 0 3 - - -  Change in appetite 0 0 - - -  Feeling bad or failure about yourself  0 0 - - -  Trouble concentrating 0 1 - - -  Moving slowly or fidgety/restless 0 0 - - -  Suicidal thoughts 0 0 - - -  PHQ-9 Score 0  4  - - -  Difficult doing work/chores Not difficult at all Not difficult at all - - -    Details       Data saved with a previous flowsheet row definition         Interpretation of Total Score  Total Score Depression Severity:  1-4 = Minimal depression, 5-9 = Mild depression, 10-14 = Moderate depression, 15-19 = Moderately severe depression, 20-27 = Severe depression   Psychosocial Evaluation and Intervention:  Psychosocial Evaluation - 04/01/24 1052       Psychosocial Evaluation & Interventions   Interventions Encouraged to exercise with the program and follow exercise prescription    Comments Hussein denies any psychosocial barriers at this time.    Expected Outcomes For Maxton to participate in PR free of psychosocial concerns.    Continue Psychosocial Services  No Follow up required          Psychosocial Re-Evaluation:  Psychosocial Re-Evaluation     Row Name 04/02/24 9061 04/22/24 1540 05/22/24 1111         Psychosocial Re-Evaluation   Current issues with None Identified None Identified None Identified     Comments Arlin is scheduled to start PR on 04/09/24. No new psychosocial barriers or concerns since orientation. Jeffrie continues to deny any psy/soc barriers or concerns at this time. He has good support from his family. Kendrell continues to deny any psy/soc barriers or concerns at this time. He has good support from his family. No needs at this time.     Expected Outcomes For Nevaan to participate in PR free of any psychosocial barriers or concerns For Jeydan to participate in PR free of any psychosocial barriers or concerns For Jann to participate in PR free of any psychosocial barriers or concerns     Interventions Encouraged to attend Pulmonary Rehabilitation for the exercise Encouraged to attend Pulmonary Rehabilitation for the exercise Encouraged to attend Pulmonary Rehabilitation for the exercise     Continue Psychosocial Services  No Follow up required No Follow up required No Follow up required        Psychosocial Discharge (Final Psychosocial Re-Evaluation):  Psychosocial Re-Evaluation - 05/22/24 1111       Psychosocial  Re-Evaluation   Current issues with None Identified    Comments Tome continues to deny any psy/soc barriers or concerns at this time. He has good support from his family. No needs at this time.    Expected Outcomes For Prescott to participate in PR free of any psychosocial barriers or concerns    Interventions Encouraged to attend Pulmonary Rehabilitation for the exercise    Continue Psychosocial Services  No Follow up required          Education: Education Goals: Education classes will be provided on a weekly basis, covering required topics. Participant will state understanding/return demonstration of topics presented.  Learning Barriers/Preferences:  Learning Barriers/Preferences - 04/01/24 1053       Learning Barriers/Preferences   Learning Barriers Language    Learning Preferences None          Education Topics: Know Your Numbers Group instruction that is supported by a PowerPoint presentation. Instructor discusses importance of knowing and understanding resting, exercise, and post-exercise oxygen  saturation, heart rate, and blood pressure. Oxygen  saturation, heart rate, blood pressure, rating of perceived exertion, and dyspnea are reviewed along with a normal range for these values.    Exercise for the Pulmonary Patient Group instruction that is supported by a PowerPoint presentation. Instructor discusses benefits of exercise, core components of  exercise, frequency, duration, and intensity of an exercise routine, importance of utilizing pulse oximetry during exercise, safety while exercising, and options of places to exercise outside of rehab.    MET Level  Group instruction provided by PowerPoint, verbal discussion, and written material to support subject matter. Instructor reviews what METs are and how to increase METs.  Flowsheet Row PULMONARY REHAB CHRONIC OBSTRUCTIVE PULMONARY DISEASE from 05/09/2024 in [facility] for Heart, Vascular, & Lung Health   Date 05/09/24  Educator EP  Instruction Review Code 1- Verbalizes Understanding    Pulmonary Medications Verbally interactive group education provided by instructor with focus on inhaled medications and proper administration.   Anatomy and Physiology of the Respiratory System Group instruction provided by PowerPoint, verbal discussion, and written material to support subject matter. Instructor reviews respiratory cycle and anatomical components of the respiratory system and their functions. Instructor also reviews differences in obstructive and restrictive respiratory diseases with examples of each.  Flowsheet Row PULMONARY REHAB CHRONIC OBSTRUCTIVE PULMONARY DISEASE from 05/23/2024 in [facility] for Heart, Vascular, & Lung Health  Date 05/23/24  Educator RT  Instruction Review Code 1- Verbalizes Understanding    Oxygen  Safety Group instruction provided by PowerPoint, verbal discussion, and written material to support subject matter. There is an overview of "What is Oxygen " and "Why do we need it".  Instructor also reviews how to create a safe environment for oxygen  use, the importance of using oxygen  as prescribed, and the risks of noncompliance. There is a brief discussion on traveling with oxygen  and resources the patient may utilize.   Oxygen  Use Group instruction provided by PowerPoint, verbal discussion, and written material to discuss how supplemental oxygen  is prescribed and different types of oxygen  supply systems. Resources for more information are provided.    Breathing Techniques Group instruction that is supported by demonstration and informational handouts. Instructor discusses the benefits of pursed lip and diaphragmatic breathing and detailed demonstration on how to perform both.  Flowsheet Row PULMONARY REHAB CHRONIC OBSTRUCTIVE PULMONARY DISEASE from 04/11/2024 in [facility] for Heart, Vascular, & Lung Health  Date  04/11/24  Educator RN  Instruction Review Code 1- Verbalizes Understanding     Risk Factor Reduction Group instruction that is supported by a PowerPoint presentation. Instructor discusses the definition of a risk factor, different risk factors for pulmonary disease, and how the heart and lungs work together. Flowsheet Row PULMONARY REHAB CHRONIC OBSTRUCTIVE PULMONARY DISEASE from 05/02/2024 in [facility] for Heart, Vascular, & Lung Health  Date 05/02/24  Educator EP  Instruction Review Code 1- Verbalizes Understanding    Pulmonary Diseases Group instruction provided by PowerPoint, verbal discussion, and written material to support subject matter. Instructor gives an overview of the different type of pulmonary diseases. There is also a discussion on risk factors and symptoms as well as ways to manage the diseases. Flowsheet Row PULMONARY REHAB CHRONIC OBSTRUCTIVE PULMONARY DISEASE from 05/16/2024 in [facility]  for Heart, Vascular, & Lung Health  Date 05/16/24  Educator RT  Instruction Review Code 1- Verbalizes Understanding    Stress and Energy Conservation Group instruction provided by PowerPoint, verbal discussion, and written material to support subject matter. Instructor gives an overview of stress and the impact it can have on the body. Instructor also reviews ways to reduce stress. There is also a discussion on energy conservation and ways to conserve energy throughout the day. Flowsheet Row PULMONARY REHAB CHRONIC OBSTRUCTIVE  PULMONARY DISEASE from 04/18/2024 in [facility] for Heart, Vascular, & Lung Health  Date 04/18/24  Educator RN  Instruction Review Code 1- Verbalizes Understanding    Warning Signs and Symptoms Group instruction provided by PowerPoint, verbal discussion, and written material to support subject matter. Instructor reviews warning signs and symptoms of stroke, heart attack, cold and flu.  Instructor also reviews ways to prevent the spread of infection. Flowsheet Row PULMONARY REHAB CHRONIC OBSTRUCTIVE PULMONARY DISEASE from 04/25/2024 in [facility] for Heart, Vascular, & Lung Health  Date 04/25/24  Educator RN  Instruction Review Code 1- Verbalizes Understanding    Other Education Group or individual verbal, written, or video instructions that support the educational goals of the pulmonary rehab program.    Knowledge Questionnaire Score:  Knowledge Questionnaire Score - 04/01/24 1207       Knowledge Questionnaire Score   Pre Score 15/18          Core Components/Risk Factors/Patient Goals at Admission:  Personal Goals and Risk Factors at Admission - 04/01/24 1053       Core Components/Risk Factors/Patient Goals on Admission   Improve shortness of breath with ADL's Yes    Intervention Provide education, individualized exercise plan and daily activity instruction to help decrease symptoms of SOB with activities of daily living.    Expected Outcomes Short Term: Improve cardiorespiratory fitness to achieve a reduction of symptoms when performing ADLs;Long Term: Be able to perform more ADLs without symptoms or delay the onset of symptoms          Core Components/Risk Factors/Patient Goals Review:   Goals and Risk Factor Review     Row Name 04/02/24 0940 04/22/24 1540 05/22/24 1112         Core Components/Risk Factors/Patient Goals Review   Personal Goals Review Improve shortness of breath with ADL's;Develop more efficient breathing techniques such as purse lipped breathing and diaphragmatic breathing and practicing self-pacing with activity. Improve shortness of breath with ADL's;Develop more efficient breathing techniques such as purse lipped breathing and diaphragmatic breathing and practicing self-pacing with activity. Improve shortness of breath with ADL's;Develop more efficient breathing techniques such as purse lipped breathing and  diaphragmatic breathing and practicing self-pacing with activity.     Review Yoshua is scheduled to start PR on 04/09/24. Unable to asses goals at this time. Monthly review of patient's Core Components/Risk Factors/Patient Goals are as follows: Goal progressing for improving shortness of breath with ADL's. He is currently exercising on RA to keep sats >88%. Goal progressing for developing more efficient breathing techniques such as purse lipped breathing and diaphragmatic breathing; and practicing self-pacing with activity. We will continue to monitor his progress throughout the program. Monthly review of patient's Core Components/Risk Factors/Patient Goals are as follows: Goal progressing for improving shortness of breath with ADL's. He is currently exercising on RA to keep sats >88%. Goal met for developing more efficient breathing techniques such as purse lipped breathing and diaphragmatic breathing; and practicing self-pacing with activity.  He has attended the breathing technique class. He is able to demonstrate PLB when he becomes SOB and knows how to self pace based on his RPE/dyspnea scores. We will continue to monitor his progress throughout the program.     Expected Outcomes To improve shortness of breath with ADL's and develop more efficient breathing techniques such as purse lipped breathing and diaphragmatic breathing; and practicing self-pacing with activity. To improve shortness of breath with ADL's and develop more efficient breathing  techniques such as purse lipped breathing and diaphragmatic breathing; and practicing self-pacing with activity. To improve shortness of breath with ADL's        Core Components/Risk Factors/Patient Goals at Discharge (Final Review):   Goals and Risk Factor Review - 05/22/24 1112       Core Components/Risk Factors/Patient Goals Review   Personal Goals Review Improve shortness of breath with ADL's;Develop more efficient breathing techniques such as purse lipped  breathing and diaphragmatic breathing and practicing self-pacing with activity.    Review Monthly review of patient's Core Components/Risk Factors/Patient Goals are as follows: Goal progressing for improving shortness of breath with ADL's. He is currently exercising on RA to keep sats >88%. Goal met for developing more efficient breathing techniques such as purse lipped breathing and diaphragmatic breathing; and practicing self-pacing with activity.  He has attended the breathing technique class. He is able to demonstrate PLB when he becomes SOB and knows how to self pace based on his RPE/dyspnea scores. We will continue to monitor his progress throughout the program.    Expected Outcomes To improve shortness of breath with ADL's          ITP Comments: Pt is making expected progress toward Pulmonary Rehab goals after completing 15 session(s). Recommend continued exercise, life style modification, education, and utilization of breathing techniques to increase stamina and strength, while also decreasing shortness of breath with exertion.  Dr. Slater Staff is Medical Director for Pulmonary Rehab at [facility].

## 2024-05-29 NOTE — Progress Notes (Signed)
 Patient advised. Patient agreed to overnight pulse oxymetry. DME order placed. NFN.

## 2024-05-29 NOTE — Telephone Encounter (Signed)
-----   Message from [facility] [place] [facility] sent at 05/28/2024  4:30 PM EST -----  ----- Message ----- From: Vicci Evalene DEL, CMA Sent: 05/27/2024   2:28 PM EST To: Dedra LITTIE Sanders, MD; [facility]#   ----- Message ----- From: Neysa Reggy BIRCH, MD Sent: 05/27/2024   2:24 PM EST To: Evalene DEL Vicci, CMA  That's because you did the obvious and looked under procedure tab. Try doing it the I.T. Way and look under Encounters tab. Ongoing problem since new software introduced to move data from interpretation system into EPIC. Working on it. Sorry.- C ----- Message ----- From: Vicci Evalene DEL, CMA Sent: 05/27/2024  11:02 AM EST To: Donzell GORMAN Finder; Reggy BIRCH Neysa, MD   ----- Message ----- From: Sanders Dedra LITTIE, MD Sent: 05/27/2024  10:57 AM EST To: [facility]  The report of the sleep study does not have an impression and recommendations. ----- Message ----- From: Rebecka Fleeta Higashi In One Three One Sent: 05/26/2024  11:25 AM EST To: Dedra LITTIE Sanders, MD

## 2024-06-04 ENCOUNTER — Other Ambulatory Visit (HOSPITAL_COMMUNITY): Payer: Self-pay | Admitting: Neurosurgery

## 2024-06-04 ENCOUNTER — Encounter (HOSPITAL_COMMUNITY)
Admission: RE | Admit: 2024-06-04 | Discharge: 2024-06-04 | Disposition: A | Discharge status: Home / Self Care | Source: Ambulatory Visit | Attending: Pulmonary Disease | Admitting: Pulmonary Disease

## 2024-06-04 DIAGNOSIS — J449 Chronic obstructive pulmonary disease, unspecified: Secondary | ICD-10-CM | POA: Diagnosis present

## 2024-06-04 DIAGNOSIS — I671 Cerebral aneurysm, nonruptured: Secondary | ICD-10-CM

## 2024-06-04 NOTE — Progress Notes (Signed)
 Daily Session Note  Patient Details  Name: Duane Cuevas MRN: [id number] Date of Birth: August 10, 1955 Referring Provider:   [name] Pulmonary Rehab Walk Test from 04/01/2024 in [facility] for Heart, Vascular, & Lung Health  Referring Provider Tamea    Encounter Date: 06/04/2024  Check In:  Session Check In - 06/04/24 1100       Check-In   Supervising physician immediately available to respond to emergencies [facility] MD immediately available    Physician(s) Damien Braver, NP    Location [facility]    Staff Present Augustin Sharps, Neita Moats, MS, ACSM-CEP, Exercise Physiologist;Valdemar Mcclenahan Harvy, RN, BSN;Randi Reeve BS, ACSM-CEP, Exercise Physiologist    Virtual Visit No    Medication changes reported     No    Fall or balance concerns reported    No    Tobacco Cessation No Change    Warm-up and Cool-down Performed as group-led instruction    Resistance Training Performed Yes    VAD Patient? No    PAD/SET Patient? No      Pain Assessment   Currently in Pain? No/denies    Multiple Pain Sites No          Capillary Blood Glucose: No results found for this or any previous visit (from the past 24 hours).    Social History   Tobacco Use  Smoking Status Every Day   Current packs/day: 1.00   Average packs/day: 1 pack/day for 53.9 years (53.9 ttl pk-yrs)   Types: Cigarettes   Start date: 37  Smokeless Tobacco Never  Tobacco Comments   02-27-2023  per pt daughter pt is still smoking, but hides how much per day,  smoking since age 68 (19)    Goals Met:  Independence with exercise equipment Exercise tolerated well Queuing for purse lip breathing No report of concerns or symptoms today Strength training completed today  Goals Unmet:  Not Applicable  Comments: Service time is from 1007 to 1146    Dr. Slater Staff is Medical Director for Pulmonary Rehab at [facility].

## 2024-06-06 ENCOUNTER — Encounter (HOSPITAL_COMMUNITY)
Admission: RE | Admit: 2024-06-06 | Discharge: 2024-06-06 | Disposition: A | Source: Ambulatory Visit | Attending: Pulmonary Disease

## 2024-06-06 DIAGNOSIS — J449 Chronic obstructive pulmonary disease, unspecified: Secondary | ICD-10-CM

## 2024-06-06 NOTE — Progress Notes (Signed)
 Daily Session Note  Patient Details  Name: Duane Cuevas MRN: [id number] Date of Birth: [birth year] Referring Provider:   [name] Pulmonary Rehab Walk Test from 04/01/2024 in [facility] for Heart, Vascular, & Lung Health  Referring Provider Tamea    Encounter Date: 06/06/2024  Check In:  Session Check In - 06/06/24 1133       Check-In   Supervising physician immediately available to respond to emergencies [facility] MD immediately available    Physician(s) Rosaline Bane, NP    Location [facility]    Staff Present Augustin Sharps, Neita Moats, MS, ACSM-CEP, Exercise Physiologist;Mary Harvy, RN, BSN;Randi Midge BS, ACSM-CEP, Exercise Physiologist;Carlette Bernett, RN, BSN    Virtual Visit No    Medication changes reported     No    Fall or balance concerns reported    No    Tobacco Cessation No Change    Warm-up and Cool-down Performed as group-led instruction    Resistance Training Performed Yes    VAD Patient? No    PAD/SET Patient? No      Pain Assessment   Currently in Pain? No/denies    Pain Score 0-No pain    Multiple Pain Sites No          Capillary Blood Glucose: No results found for this or any previous visit (from the past 24 hours).    Social History   Tobacco Use  Smoking Status Every Day   Current packs/day: 1.00   Average packs/day: 1 pack/day for 53.9 years (53.9 ttl pk-yrs)   Types: Cigarettes   Start date: 40  Smokeless Tobacco Never  Tobacco Comments   02-27-2023  per pt daughter pt is still smoking, but hides how much per day,  smoking since age 61 (42)    Goals Met:  Proper associated with RPD/PD & O2 Sat Independence with exercise equipment Exercise tolerated well No report of concerns or symptoms today Strength training completed today  Goals Unmet:  Not Applicable  Comments: Service time is from 1023 to 1147.    Dr. Slater Staff is Medical Director for Pulmonary Rehab at  [facility].

## 2024-06-11 ENCOUNTER — Encounter (HOSPITAL_COMMUNITY)
Admission: RE | Admit: 2024-06-11 | Discharge: 2024-06-11 | Disposition: A | Source: Ambulatory Visit | Attending: Pulmonary Disease

## 2024-06-11 ENCOUNTER — Telehealth (HOSPITAL_COMMUNITY): Payer: Self-pay

## 2024-06-11 VITALS — Wt 169.1 lb

## 2024-06-11 DIAGNOSIS — J449 Chronic obstructive pulmonary disease, unspecified: Secondary | ICD-10-CM | POA: Diagnosis not present

## 2024-06-11 NOTE — Progress Notes (Signed)
 Daily Session Note  Patient Details  Name: Duane Cuevas MRN: [id number] Date of Birth: 06-Mar-1956 Referring Provider:   [name] Pulmonary Rehab Walk Test from 04/01/2024 in [facility] for [facility], Vascular, & Lung Health  Referring Provider Tamea    Encounter Date: 06/11/2024  Check In:  Session Check In - 06/11/24 1053       Check-In   Supervising physician immediately available to respond to emergencies [facility] MD immediately available    Physician(s) Rosaline Bane, NP    Location [facility]    Staff Present Augustin Sharps, Neita Moats, MS, ACSM-CEP, Exercise Physiologist;Mary Harvy, RN, BSN;Randi Reeve BS, ACSM-CEP, Exercise Physiologist    Virtual Visit No    Medication changes reported     No    Fall or balance concerns reported    No    Tobacco Cessation No Change    Warm-up and Cool-down Performed as group-led instruction    Resistance Training Performed Yes    VAD Patient? No    PAD/SET Patient? No      Pain Assessment   Currently in Pain? No/denies    Pain Score 0-No pain    Multiple Pain Sites No          Capillary Blood Glucose: No results found for this or any previous visit (from the past 24 hours).   Exercise Prescription Changes - 06/11/24 1100       Response to Exercise   Blood Pressure (Admit) 98/66    Blood Pressure (Exercise) 134/70    Blood Pressure (Exit) 100/66    Heart Rate (Admit) 98 bpm    Heart Rate (Exercise) 123 bpm    Heart Rate (Exit) 97 bpm    Oxygen  Saturation (Admit) 94 %    Oxygen  Saturation (Exercise) 93 %    Oxygen  Saturation (Exit) 95 %    Rating of Perceived Exertion (Exercise) 10    Perceived Dyspnea (Exercise) 1    Duration Continue with 30 min of aerobic exercise without signs/symptoms of physical distress.    Intensity THRR unchanged      Progression   Progression Continue to progress workloads to maintain intensity without signs/symptoms of physical distress.       Resistance Training   Training Prescription Yes    Weight black bands    Reps 10-15    Time 10 Minutes      Treadmill   MPH 2.7    Grade 2    Minutes 15    METs 3.7      Recumbant Elliptical   Level 4    Minutes 15    METs 4.3          Social History   Tobacco Use  Smoking Status Every Day   Current packs/day: 1.00   Average packs/day: 1 pack/day for 53.9 years (53.9 ttl pk-yrs)   Types: Cigarettes   Start date: 87  Smokeless Tobacco Never  Tobacco Comments   02-27-2023  per pt daughter pt is still smoking, but hides how much per day,  smoking since age 49 (62)    Goals Met:  Proper associated with RPD/PD & O2 Sat Independence with exercise equipment Exercise tolerated well No report of concerns or symptoms today Strength training completed today  Goals Unmet:  Not Applicable  Comments: Service time is from 1008 to 1133.    Dr. Slater Staff is Medical Director for Pulmonary Rehab at [facility].

## 2024-06-11 NOTE — Telephone Encounter (Signed)
 Dr. Tamea, Can we have an order to increase Duane Cuevas's target heart rate to 150 for exercise during pulmonary rehab?  Thanks, Augustin Sharps, RRT

## 2024-06-11 NOTE — Telephone Encounter (Signed)
 Ok to increase target HR to 150 during exercise.

## 2024-06-13 ENCOUNTER — Encounter (HOSPITAL_COMMUNITY)
Admission: RE | Admit: 2024-06-13 | Discharge: 2024-06-13 | Disposition: A | Discharge status: Home / Self Care | Source: Ambulatory Visit | Attending: Pulmonary Disease | Admitting: Pulmonary Disease

## 2024-06-13 DIAGNOSIS — J449 Chronic obstructive pulmonary disease, unspecified: Secondary | ICD-10-CM | POA: Diagnosis not present

## 2024-06-13 NOTE — Progress Notes (Signed)
 Daily Session Note  Patient Details  Name: Duane Cuevas MRN: [id number] Date of Birth: 1956-03-10 Referring Provider:   [name] Pulmonary Rehab Walk Test from 04/01/2024 in [facility] for Heart, Vascular, & Lung Health  Referring Provider Tamea    Encounter Date: 06/13/2024  Check In:  Session Check In - 06/13/24 0954       Check-In   Supervising physician immediately available to respond to emergencies [facility] immediately available    Physician(s) Lum Louis, NP    [facility]    Staff Present Augustin Sharps, Neita Moats, MS, ACSM-CEP, Exercise Physiologist;Mary Harvy, RN, BSN;Randi Reeve BS, ACSM-CEP, Exercise Physiologist    Virtual Visit No    Medication changes reported     No    Fall or balance concerns reported    No    Tobacco Cessation No Change    Warm-up and Cool-down Performed as group-led instruction    Resistance Training Performed Yes    VAD Patient? No    PAD/SET Patient? No      Pain Assessment   Currently in Pain? No/denies    Multiple Pain Sites No          Capillary Blood Glucose: No results found for this or any previous visit (from the past 24 hours).    Tobacco Use History[1]  Goals Met:  Proper associated with RPD/PD & O2 Sat Independence with exercise equipment Exercise tolerated well No report of concerns or symptoms today Strength training completed today  Goals Unmet:  Not Applicable  Comments: Service time is from 1008 to 1147.    Dr. Slater Staff is Medical Director for Pulmonary Rehab at [facility].     [1]  Social History Tobacco Use  Smoking Status Every Day   Current packs/day: 1.00   Average packs/day: 1 pack/day for 53.9 years (53.9 ttl pk-yrs)   Types: Cigarettes   Start date: 57  Smokeless Tobacco Never  Tobacco Comments   02-27-2023  per pt daughter pt is still smoking, but hides how much per day,  smoking since age 38 (68)

## 2024-06-14 ENCOUNTER — Telehealth: Payer: Self-pay

## 2024-06-14 DIAGNOSIS — J449 Chronic obstructive pulmonary disease, unspecified: Secondary | ICD-10-CM | POA: Diagnosis not present

## 2024-06-14 NOTE — Telephone Encounter (Signed)
 Copied from CRM #[id number]. Topic: Clinical - Medical Advice >> Jun 12, 2024  3:36 PM Ismael A wrote: Reason for CRM: Received call from pt's daughter, [name] stating that patient was prescribed a heart monitor about a week ago and just received it today, they would like to know how long the patient needs to wear device for. Please call back with more details - daughter's ph# [phone]

## 2024-06-14 NOTE — Telephone Encounter (Signed)
 [name], reports patient has completed overnight pulse oximetry. NFN.

## 2024-06-15 DIAGNOSIS — J441 Chronic obstructive pulmonary disease with (acute) exacerbation: Secondary | ICD-10-CM | POA: Diagnosis not present

## 2024-06-18 ENCOUNTER — Encounter (HOSPITAL_COMMUNITY): Admission: RE | Admit: 2024-06-18 | Discharge: 2024-06-18 | Attending: Pulmonary Disease

## 2024-06-18 DIAGNOSIS — J449 Chronic obstructive pulmonary disease, unspecified: Secondary | ICD-10-CM

## 2024-06-18 NOTE — Progress Notes (Signed)
 Daily Session Note  Patient Details  Name: Duane Cuevas MRN: [id number] Date of Birth: February 01, 1956 Referring Provider:   [name] Pulmonary Rehab Walk Test from 04/01/2024 in [facility] for Heart, Vascular, & Lung Health  Referring Provider Tamea    Encounter Date: 06/18/2024  Check In:  Session Check In - 06/18/24 1107       Check-In   Supervising physician immediately available to respond to emergencies [facility] MD immediately available    Physician(s) Orren Fabry, NP    [facility]    Staff Present Augustin Sharps, Neita Moats, MS, ACSM-CEP, Exercise Physiologist;Mary Harvy, RN, BSN;Carlette Carlton, RN, BSN    Virtual Visit No    Medication changes reported     No    Fall or balance concerns reported    No    Tobacco Cessation No Change    Warm-up and Cool-down Performed as group-led Writer Performed Yes    VAD Patient? No    PAD/SET Patient? No      Pain Assessment   Currently in Pain? No/denies    Multiple Pain Sites No          Capillary Blood Glucose: No results found for this or any previous visit (from the past 24 hours).    Tobacco Use History[1]  Goals Met:  Proper associated with RPD/PD & O2 Sat Independence with exercise equipment Exercise tolerated well No report of concerns or symptoms today Strength training completed today  Goals Unmet:  Not Applicable  Comments: Service time is from 1011 to 1145.    Dr. Slater Staff is Medical Director for Pulmonary Rehab at [facility].     [1]  Social History Tobacco Use  Smoking Status Every Day   Current packs/day: 1.00   Average packs/day: 1 pack/day for 54.0 years (54.0 ttl pk-yrs)   Types: Cigarettes   Start date: 34  Smokeless Tobacco Never  Tobacco Comments   02-27-2023  per pt daughter pt is still smoking, but hides how much per day,  smoking since age 12 (68)

## 2024-06-19 ENCOUNTER — Ambulatory Visit (HOSPITAL_COMMUNITY): Admission: RE | Admit: 2024-06-19 | Discharge: 2024-06-19 | Attending: Neurosurgery

## 2024-06-19 ENCOUNTER — Other Ambulatory Visit: Payer: Self-pay | Admitting: Emergency Medicine

## 2024-06-19 DIAGNOSIS — Q278 Other specified congenital malformations of peripheral vascular system: Secondary | ICD-10-CM | POA: Diagnosis not present

## 2024-06-19 DIAGNOSIS — I671 Cerebral aneurysm, nonruptured: Secondary | ICD-10-CM | POA: Insufficient documentation

## 2024-06-19 DIAGNOSIS — E1165 Type 2 diabetes mellitus with hyperglycemia: Secondary | ICD-10-CM

## 2024-06-19 NOTE — Telephone Encounter (Unsigned)
 Copied from CRM #[id number]. Topic: Clinical - Medication Refill >> Jun 19, 2024  1:23 PM Duane Cuevas ORN wrote: Medication: Dulaglutide  (TRULICITY ) 1.5 MG/0.5ML SOAJ  Has the patient contacted their pharmacy? Yes  This is the patient's preferred pharmacy:    [facility] [place]  - [place], [place] - [address] [place] [address] Phone: [phone] Fax: [phone]  Is this the correct pharmacy for this prescription? Yes If no, delete pharmacy and type the correct one.   Has the prescription been filled recently? No  Is the patient out of the medication? No  Has the patient been seen for an appointment in the last year OR does the patient have an upcoming appointment? Yes  Can we respond through MyChart? No

## 2024-06-20 ENCOUNTER — Encounter (HOSPITAL_COMMUNITY)

## 2024-06-20 DIAGNOSIS — I671 Cerebral aneurysm, nonruptured: Secondary | ICD-10-CM | POA: Diagnosis not present

## 2024-06-21 ENCOUNTER — Ambulatory Visit: Admitting: Pulmonary Disease

## 2024-06-21 ENCOUNTER — Encounter: Payer: Self-pay | Admitting: Pulmonary Disease

## 2024-06-21 VITALS — BP 122/76 | HR 86 | Temp 98.1°F | Ht 68.0 in | Wt 169.0 lb

## 2024-06-21 DIAGNOSIS — J449 Chronic obstructive pulmonary disease, unspecified: Secondary | ICD-10-CM

## 2024-06-21 DIAGNOSIS — J4489 Other specified chronic obstructive pulmonary disease: Secondary | ICD-10-CM | POA: Diagnosis not present

## 2024-06-21 DIAGNOSIS — G4736 Sleep related hypoventilation in conditions classified elsewhere: Secondary | ICD-10-CM

## 2024-06-21 DIAGNOSIS — J439 Emphysema, unspecified: Secondary | ICD-10-CM

## 2024-06-21 DIAGNOSIS — R0602 Shortness of breath: Secondary | ICD-10-CM

## 2024-06-21 MED ORDER — TRULICITY 1.5 MG/0.5ML ~~LOC~~ SOAJ: SUBCUTANEOUS | 10 refills | Status: AC

## 2024-06-21 NOTE — Progress Notes (Signed)
 "  Subjective:    Patient ID: Duane Cuevas, male    DOB: [birth year], 68 y.o.   MRN: [id number]  Patient Care Team: Purcell Emil Schanz, MD as PCP - General (Internal Medicine) Rozella, Toribio BROCKS, [facility] (Inactive) as Pharmacist (Pharmacist)  Chief Complaint  Patient presents with   COPD    Shortness of breath on exertion. Cough with occasional phlegm. Completed ONO on 06/11/2024.     BACKGROUND/INTERVAL:The patient is a 68 year old current smoker, with a history as noted below, who presents for follow-up of COPD and nocturnal hypoxemia related to the same.  Patient was last seen here on 22 March 2024.  *History was obtained directly from the patient communicating in his native language (Spanish) in which I am proficient.   HPI Discussed the use of AI scribe software for clinical note transcription with the patient, who gave verbal consent to proceed.  History of Present Illness   He is a 68 year old male with severe COPD who presents for follow-up.  He presents with his daughter [name].  He continues to use oxygen  at night and does not have significant sleep apnea only very mild sleep apnea, did not sleep night criteria.  He is on nocturnal oxygen  at 2 L/min which he will need to continue.  He uses his inhaler, Breztri , twice daily and albuterol  inhaler as needed during the day. He is participating in a pulmonary rehabilitation program, which he finds beneficial, and is expected to complete it by February or March. He missed a recent session due to a medical appointment.  His pulmonary function test (PFT) conducted in September showed significantly reduced lung function, which led to his enrollment in the rehabilitation program.  He is enrolled in lung cancer screening.  He unfortunately continues to smoke.  DATA 08/14/2018 alpha-1 antitrypsin phenotype /level: MM phenotype, 169 mg/dL (normal). 12/14/2023 echocardiogram: Showed LVEF 65 to 70%, no wall motion abnormalities.  RV  systolic function normal.  Normal RV size.  Moderately elevated pulmonary artery systolic pressure.  Inferior vena cava is dilated suggesting right atrial pressure 15 mmHg.  02/05/2024 nitric oxide : 14 ppb (low) 03/05/2024 LDCT chest: Moderate centrilobular and paraseptal emphysema, scattered calcified granulomas, left upper lobe solid nodule 1.2 mm.  Benign appearance or behavior, lung RADS 2. 03/22/2024 PFTs: FEV1 0.89 L or 28% predicted, FVC 2.25 L or 54% predicted, FEV1/FVC 40%.  No bronchodilator response.  Lung volumes show mild hyperinflation and air trapping diffusion capacity moderately decreased.  Consistent with very severe chronic obstructive pulmonary disease. 05/20/2024 split-night sleep study: Very mild obstructive sleep apnea with AHI 9.6/h mild snoring with oxygen  desaturation to a nadir of 82% did not meet criteria for split-night CPAP titration. 06/11/2024 overnight oximetry test: Significant desaturations to 64% overnight.  Oxygen  desaturation events 27 events per hour.  Review of Systems A 10 point review of systems was performed and it is as noted above otherwise negative.   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 GOLD III/ active smoker 08/14/2018   COPD (chronic obstructive pulmonary disease) (HCC) 10/14/2014   Dyslipidemia associated with type 2  diabetes mellitus (HCC) 10/14/2014    Social History   Tobacco Use   Smoking status: Every Day    Current packs/day: 1.00    Average packs/day: 1 pack/day for 54.0 years (54.0 ttl pk-yrs)    Types: Cigarettes    Start date: 1972   Smokeless tobacco: Never   Tobacco comments:    02-27-2023  per pt daughter pt is still smoking, but hides how much per day,  smoking since age 65 (10)  Substance Use Topics   Alcohol use: Not Currently    Alcohol/week: 2.0 standard drinks of alcohol    Types: 2 Cans of beer per week    Comment: 2 beers per week    Allergies[1]  Active Medications[2]  Immunization History  Administered Date(s) Administered   Fluad Quad(high Dose 65+) 03/24/2021, 09/21/2022   Fluad Trivalent(High Dose 65+) 04/17/2023   Influenza,inj,Quad PF,6+ Mos 09/21/2015, 07/02/2018   Influenza-Unspecified 08/03/2020   PFIZER(Purple Top)SARS-COV-2 Vaccination 09/29/2019, 06/12/2020, 10/20/2020   PNEUMOCOCCAL CONJUGATE-20 12/29/2022   Pneumococcal Conjugate-13 09/21/2020   Pneumococcal Polysaccharide-23 10/15/2014, 09/21/2015        Objective:     Vitals:   06/21/24 1002  BP: 122/76  Pulse: 86  Temp: 98.1 F (36.7 C)  Height: 5' 8 (1.727 m)  Weight: 169 lb (76.7 kg)  SpO2: 96%  TempSrc: Temporal  BMI (Calculated): 25.7     SpO2: 96 %  GENERAL: Well-developed, well-nourished gentleman, no acute distress.  No conversational dyspnea, fully ambulatory. HEAD: Normocephalic, atraumatic.  EYES: Pupils equal, round, reactive to light.  No scleral icterus.  MOUTH: Edentulous, wears dentures, oral mucosa moist.  No thrush.  Mallampati I airway NECK: Supple. No thyromegaly. Trachea midline. No JVD.  No adenopathy. PULMONARY: Good air entry bilaterally.  Coarse, faint, rare end expiratory wheeze. CARDIOVASCULAR: S1 and S2. Regular rate and rhythm.  No rubs, murmurs or gallops heard. ABDOMEN: Benign. MUSCULOSKELETAL: No joint deformity, no clubbing, no edema.   NEUROLOGIC: No overt focal deficit, no gait disturbance, speech is fluent. SKIN: Intact,warm,dry. PSYCH: Mood and behavior normal.        Assessment & Plan:     ICD-10-CM   1. COPD, very severe (HCC)  J44.9 Pulmonary function test    2. Nocturnal hypoxemia due to emphysema (HCC)  J43.9    G47.36     3. Shortness of breath  R06.02 Pulmonary function test      Orders Placed This Encounter  Procedures   Pulmonary function test    Standing Status:   Future    Expected Date:   10/20/2024    Expiration Date:   06/21/2025    Where should this test be performed?:   Outpatient Pulmonary    What type of PFT is being ordered?:   Full PFT   Discussion:    Chronic obstructive pulmonary disease Severe COPD with improved respiratory status. Continues nocturnal oxygen  therapy. Uses Vistri inhaler twice daily and rescue inhaler as needed. Participating in pulmonary rehabilitation program with positive outcomes. Previous pulmonary function tests in September showed low results, prompting initiation of rehabilitation program. - Continue nocturnal oxygen  therapy. - Continue Vistri inhaler twice daily and rescue inhaler as needed. - Continue pulmonary rehabilitation program. - Will schedule follow-up appointment in four months with repeat pulmonary function tests.   Advised if symptoms do not improve or worsen, to  please contact office for sooner follow up or seek emergency care.    I spent 30 minutes of dedicated to the care of this patient on the date of this encounter to include pre-visit review of records, face-to-face time with the patient discussing conditions above, post visit ordering of testing, clinical documentation with the electronic health record, making appropriate referrals as documented, and communicating necessary findings to members of the patients care team.     C. Leita Sanders, MD Advanced Bronchoscopy PCCM [facility]    *This note was generated  using voice recognition software/Dragon and/or AI transcription program.  Despite best efforts to proofread, errors can occur which can change the meaning. Any transcriptional errors that result from this process are unintentional and may not be fully corrected at the time of dictation.     [1] No Known Allergies [2]  Current Meds  Medication Sig   albuterol  (PROVENTIL ) (2.5 MG/3ML) 0.083% nebulizer solution INHALE ONE VIAL VIA NEBULIZER EVERY 6 HOURS AS NEEDED FOR WHEEZING OR FOR SHORTNESS OF BREATH   albuterol  (VENTOLIN  HFA) 108 (90 Base) MCG/ACT inhaler 2 inhalaciones Inhalaci n Cada 6 horas PRN, sibilancias, dificultad para respirar   amLODipine  (NORVASC ) 5 MG tablet Take 1 tablet (5 mg total) by mouth daily. TOMAR 1 TABLETA POR VAI ORAL UNA VEZ AL DIA   aspirin  EC 81 MG tablet Take 1 tablet (81 mg total) by mouth daily. Swallow whole.   atorvastatin  (LIPITOR) 20 MG tablet Take 1 tablet (20 mg total) by mouth daily. TOMAR 1 TABLETA POR VIA ORAL UNA VEZ AL DIA   budesonide -glycopyrrolate-formoterol  (BREZTRI  AEROSPHERE) 160-9-4.8 MCG/ACT AERO inhaler INHALE TWO PUFFS BY MOUTH TWICE A DAY   Continuous Glucose Sensor (FREESTYLE LIBRE 14 DAY SENSOR) MISC APPLY ONE SENSOR TO THE BACK OF YOUR UPPER ARM. REPLACE EVERY 14 DAYS.   cyclobenzaprine  (FLEXERIL ) 10 MG tablet Take 1 tablet (10 mg total) by mouth 2 (two) times daily as needed for muscle spasms.   FARXIGA  10 MG TABS tablet TOMAR 1 TABLETA POR VIA ORAL UNA VEZ AL DIA   gabapentin  (NEURONTIN ) 300 MG capsule Take 1 capsule (300 mg total) by mouth at bedtime as needed.   glipiZIDE  (GLUCOTROL ) 10 MG tablet TOMAR 1 TABLETA POR VIA ORAL UNA VEZ AL DIA ANTES DEL DESAYUNO *TOMAR 1 TABLETA ADICIONAL SI AZUCAR EN LA SANGRE ESTA ALTA*   glucose blood test strip Use as instructed   guaifenesin  (ROBITUSSIN) 100 MG/5ML syrup Take 200 mg by mouth 3 (three) times daily as needed for cough.   ibuprofen  (ADVIL ) 800 MG tablet Take 1 tablet (800 mg total) by  mouth every 8 (eight) hours as needed.   insulin  aspart (NOVOLOG  FLEXPEN) 100 UNIT/ML FlexPen INYECTAR 3 UNIDADES POR VIA SUBCUTANEA TRES VECES AL DIA COMO INDICADO. AJUSTAR CANTIDAD DE INSULINA POR ESCALA MOVIL. MAS DOSIS 30 UNIDADES   pantoprazole  (PROTONIX ) 40 MG tablet Take 1 tablet (40 mg total) by mouth daily. TOMAR 1 TABLETA POR VIA ORAL UNA VEZ AL DIA   TRESIBA  FLEXTOUCH 100 UNIT/ML FlexTouch Pen Inject 30 Units into the skin daily.   [DISCONTINUED] Dulaglutide  (TRULICITY ) 1.5 MG/0.5ML SOAJ INYECTAR CONTENIDOS DE UN LAPIZ POR VIA SUBCUTANEA CADA SEMANA, EL MISMO DIA CADA SEMANA   "

## 2024-06-21 NOTE — Patient Instructions (Addendum)
 VISIT SUMMARY:  You are a 68 year old male with severe chronic obstructive pulmonary disease (COPD) who came in for a follow-up visit. You continue to use oxygen  at night and do not have sleep apnea. You are using your inhalers as prescribed and are participating in a pulmonary rehabilitation program, which you find beneficial.  YOUR PLAN:  -CHRONIC OBSTRUCTIVE PULMONARY DISEASE (COPD): COPD is a chronic lung condition that makes it hard to breathe. You have severe COPD but your respiratory status has improved. You should continue using nocturnal oxygen  therapy, your Breztri  inhaler twice daily, and your rescue inhaler as needed. Keep participating in the pulmonary rehabilitation program, as it is helping you. We will schedule a follow-up appointment in four months and repeat your pulmonary function tests at that time.  INSTRUCTIONS:  Please continue with your current treatment plan, including nocturnal oxygen  therapy, Breztri  inhaler twice daily, and rescue inhaler as needed. Keep attending your pulmonary rehabilitation sessions. We will see you again in four months for a follow-up appointment and repeat pulmonary function tests.   RESUMEN DE LA VISITA:  Usted tiene enfermedad pulmonar obstructiva crnica (EPOC) que acudi a una consulta de seguimiento. Contina usando oxgeno por la noche y no presenta apnea del sueo. Est usando sus inhaladores segn lo prescrito y participa en un programa de rehabilitacin pulmonar, que le resulta beneficioso.  SU PLAN:  - ENFERMEDAD PULMONAR OBSTRUCTIVA CRNICA (EPOC): La EPOC es una afeccin pulmonar crnica que dificulta la respiracin. Tiene EPOC grave, pero su estado respiratorio ha mejorado. Debe continuar con la oxigenoterapia nocturna, su inhalador Breztri  dos veces al da y su inhalador de rescate segn sea necesario. Contine participando en el programa de rehabilitacin pulmonar, ya que le est ayudando. Programaremos una cita de seguimiento dentro  de cuatro meses y repetiremos sus pruebas de funcin pulmonar en ese momento.  INSTRUCCIONES:  Contine con su plan de tratamiento actual, que incluye oxigenoterapia nocturna, inhalador Breztri  dos veces al da e inhalador de rescate segn sea necesario. Contine asistiendo a sus sesiones de rehabilitacin pulmonar. Nos volveremos a ver en cuatro meses para una cita de seguimiento y repetir las pruebas de funcin pulmonar.

## 2024-06-25 ENCOUNTER — Encounter (HOSPITAL_COMMUNITY)
Admission: RE | Admit: 2024-06-25 | Discharge: 2024-06-25 | Disposition: A | Discharge status: Home / Self Care | Source: Ambulatory Visit | Attending: Pulmonary Disease | Admitting: Pulmonary Disease

## 2024-06-25 VITALS — Wt 168.4 lb

## 2024-06-25 DIAGNOSIS — J449 Chronic obstructive pulmonary disease, unspecified: Secondary | ICD-10-CM | POA: Diagnosis not present

## 2024-06-25 NOTE — Progress Notes (Signed)
 Daily Session Note  Patient Details  Name: Duane Cuevas MRN: [id number] Date of Birth: 1956-04-29 Referring Provider:   [name] Pulmonary Rehab Walk Test from 04/01/2024 in [facility] for [facility], Vascular, & Lung Health  Referring Provider Tamea    Encounter Date: 06/25/2024  Check In:  Session Check In - 06/25/24 1057       Check-In   Supervising physician immediately available to respond to emergencies [facility] MD immediately available    Physician(s) Barnie Press, NP    Location [facility]    Staff Present Augustin Sharps, Candia Levin, RN, BSN;Randi Reeve BS, ACSM-CEP, Exercise Physiologist    Virtual Visit No    Medication changes reported     No    Fall or balance concerns reported    No    Tobacco Cessation No Change    Warm-up and Cool-down Performed as group-led instruction    Resistance Training Performed Yes    VAD Patient? No    PAD/SET Patient? No      Pain Assessment   Currently in Pain? No/denies    Multiple Pain Sites No          Capillary Blood Glucose: No results found for this or any previous visit (from the past 24 hours).   Exercise Prescription Changes - 06/25/24 1100       Response to Exercise   Blood Pressure (Admit) 116/62    Blood Pressure (Exercise) 150/80    Blood Pressure (Exit) 100/68    Heart Rate (Admit) 105 bpm    Heart Rate (Exercise) 129 bpm    Heart Rate (Exit) 105 bpm    Oxygen  Saturation (Admit) 95 %    Oxygen  Saturation (Exercise) 92 %    Oxygen  Saturation (Exit) 96 %    Rating of Perceived Exertion (Exercise) 10    Perceived Dyspnea (Exercise) 0    Duration Continue with 30 min of aerobic exercise without signs/symptoms of physical distress.    Intensity THRR unchanged      Progression   Progression Continue to progress workloads to maintain intensity without signs/symptoms of physical distress.      Resistance Training   Weight black bands    Reps 10-15     Time 10 Minutes      Treadmill   MPH 2.7    Grade 2    Minutes 15    METs 3.7      Recumbant Elliptical   Level 4    Minutes 15    METs 4.3          Tobacco Use History[1]  Goals Met:  Proper associated with RPD/PD & O2 Sat Independence with exercise equipment Exercise tolerated well No report of concerns or symptoms today Strength training completed today  Goals Unmet:  Not Applicable  Comments: Service time is from 1008 to 1145.    Dr. Slater Staff is Medical Director for Pulmonary Rehab at [facility].     [1]  Social History Tobacco Use  Smoking Status Every Day   Current packs/day: 1.00   Average packs/day: 1 pack/day for 54.0 years (54.0 ttl pk-yrs)   Types: Cigarettes   Start date: 61  Smokeless Tobacco Never  Tobacco Comments   02-27-2023  per pt daughter pt is still smoking, but hides how much per day,  smoking since age 79 (20)

## 2024-06-26 NOTE — Progress Notes (Signed)
 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   Coronary artery disease    (pt admitted for chest pain ) nuclear stress test 11-02-2019  low risk, nuclear ef 58%,  prior inferior defect of severe severity indicative of previous MI;;   echo 04-28-2020 ef 60-65%, mild concentric LVH, RSVP 54.27mmHg;;   cardiac CTA 09-15-2020  unremarkable,  calcium  score=zero   Dependence on nocturnal oxygen  therapy    02-27-2023  per pt daughter,  pt only uses at night w/ O2 sat ,2-3L via [place],  only uses portable if going outside when every hot which is not much   Diverticulosis of colon    DOE (dyspnea on exertion)    02-27-2023  per pt daugter sob w/ stairs, incline's,  ok w/ some yard work if not warm/ hot wearther,  can do house hold chores   ED (erectile dysfunction)    Emphysema/COPD    followed by pcp;   uses breztri  bid, uses preventil nebulizer every afternoon;   last exacerbation 06/ 2023 in epic   Full dentures    GERD (gastroesophageal reflux disease)    Hepatic steatosis    History of adenomatous polyp of colon    History of gastric ulcer    per EGD 09-16-2020  non-bleeding   History of MI  (myocardial infarction)    per nuclear stress test in epic 11-02-2019 showed prior inferior defect of severe severity indicative of previous MI   History of pelvic fracture    1999  and 2009   Hyperlipidemia    Hypertension    Smokers' cough (HCC)    Type 2 diabetes mellitus treated with insulin  (HCC)    followed by pcp;  dx 2016   (02-27-2023  per pt daughter pt   Wears glasses     Tobacco Use: Tobacco Use History[2]  Labs: Review Flowsheet  More data exists      Latest Ref Rng & Units 12/29/2022 03/09/2023 08/02/2023 12/14/2023 02/14/2024  Labs for ITP Cardiac and Pulmonary Rehab  Cholestrol 0 - 200 mg/dL 848  - - - 873   LDL (calc) 0 - 99 mg/dL 80  - - - 63   HDL-C >60.99 mg/dL 42.39  - - - 56.29   Trlycerides 0.0 - 149.0 mg/dL 29.9  - - - 07.9   Hemoglobin A1c 4.8 - 5.6 % 6.6  7.5  6.7  7.2  -    Capillary Blood Glucose: Lab Results  Component Value Date   GLUCAP 299 (H) 05/09/2024   GLUCAP 327 (H) 05/07/2024   GLUCAP 117 (H) 04/18/2024   GLUCAP 149 (H) 04/18/2024   GLUCAP 216 (H) 04/16/2024     Pulmonary Assessment Scores:  Pulmonary Assessment Scores     Row  Name 04/01/24 1109         ADL UCSD   ADL Phase Entry     SOB Score total 88       CAT Score   CAT Score 22       mMRC Score   mMRC Score 4       UCSD: Self-administered rating of dyspnea associated with activities of daily living (ADLs) 6-point scale (0 = not at all to 5 = maximal or unable to do because of breathlessness)  Scoring Scores range from 0 to 120.  Minimally important difference is 5 units  CAT: CAT can identify the health impairment of COPD patients and is better correlated with disease progression.  CAT has a scoring range of zero to 40. The CAT score is classified into four groups of low (less than 10), medium (10 - 20), high (21-30) and very high (31-40) based on the impact level of disease on health status. A CAT score over 10 suggests significant symptoms.  A worsening CAT  score could be explained by an exacerbation, poor medication adherence, poor inhaler technique, or progression of COPD or comorbid conditions.  CAT MCID is 2 points  mMRC: mMRC (Modified Medical Research Council) Dyspnea Scale is used to assess the degree of baseline functional disability in patients of respiratory disease due to dyspnea. No minimal important difference is established. A decrease in score of 1 point or greater is considered a positive change.   Pulmonary Function Assessment:  Pulmonary Function Assessment - 04/01/24 1109       Breath   Bilateral Breath Sounds Decreased;Wheezes    Shortness of Breath Yes;Limiting activity;Fear of Shortness of Breath;Panic with Shortness of Breath          Exercise Target Goals: Exercise Program Goal: Individual exercise prescription set using results from initial 6 min walk test and THRR while considering  patients activity barriers and safety.   Exercise Prescription Goal: Initial exercise prescription builds to 30-45 minutes a day of aerobic activity, 2-3 days per week.  Home exercise guidelines will be given to patient during program as part of exercise prescription that the participant will acknowledge.  Activity Barriers & Risk Stratification:  Activity Barriers & Cardiac Risk Stratification - 04/01/24 1112       Activity Barriers & Cardiac Risk Stratification   Activity Barriers Deconditioning;Muscular Weakness;Shortness of Breath;History of Falls;Back Problems          6 Minute Walk:  6 Minute Walk     Row Name 04/01/24 1202         6 Minute Walk   Phase Initial     Distance 1125 feet     Walk Time 6 minutes     # of Rest Breaks 0     MPH 2.13     METS 2.92     RPE 13     Perceived Dyspnea  3     VO2 Peak 10.22     Symptoms No     Resting HR 86 bpm     Resting BP 112/60     Resting Oxygen  Saturation  95 %     Exercise Oxygen  Saturation  during 6 min walk 90 %     Max Ex. HR 111 bpm     Max Ex. BP  126/64     2 Minute Post BP 108/60       Interval HR   1 Minute HR 107     2 Minute HR 104  3 Minute HR 101     4 Minute HR 103     5 Minute HR 111     6 Minute HR 108     2 Minute Post HR 90     Interval Heart Rate? Yes       Interval Oxygen    Interval Oxygen ? Yes     Baseline Oxygen  Saturation % 95 %     1 Minute Oxygen  Saturation % 94 %     1 Minute Liters of Oxygen  0 L     2 Minute Oxygen  Saturation % 93 %     2 Minute Liters of Oxygen  0 L     3 Minute Oxygen  Saturation % 92 %     3 Minute Liters of Oxygen  0 L     4 Minute Oxygen  Saturation % 91 %     4 Minute Liters of Oxygen  0 L     5 Minute Oxygen  Saturation % 91 %     5 Minute Liters of Oxygen  0 L     6 Minute Oxygen  Saturation % 90 %     6 Minute Liters of Oxygen  0 L     2 Minute Post Oxygen  Saturation % 97 %     2 Minute Post Liters of Oxygen  0 L        Oxygen  Initial Assessment:  Oxygen  Initial Assessment - 04/01/24 1051       Home Oxygen    Home Oxygen  Device Home Concentrator    Sleep Oxygen  Prescription Continuous    Liters per minute 2.5    Home Exercise Oxygen  Prescription None    Home Resting Oxygen  Prescription None    Compliance with Home Oxygen  Use Yes      Initial 6 min Walk   Oxygen  Used None      Program Oxygen  Prescription   Program Oxygen  Prescription None      Intervention   Short Term Goals To learn and exhibit compliance with exercise, home and travel O2 prescription;To learn and understand importance of maintaining oxygen  saturations>88%;To learn and demonstrate proper use of respiratory medications;To learn and understand importance of monitoring SPO2 with pulse oximeter and demonstrate accurate use of the pulse oximeter.;To learn and demonstrate proper pursed lip breathing techniques or other breathing techniques.     Long  Term Goals Exhibits compliance with exercise, home  and travel O2 prescription;Maintenance of O2 saturations>88%;Compliance with respiratory  medication;Verbalizes importance of monitoring SPO2 with pulse oximeter and return demonstration;Exhibits proper breathing techniques, such as pursed lip breathing or other method taught during program session;Demonstrates proper use of MDIs          Oxygen  Re-Evaluation:  Oxygen  Re-Evaluation     Row Name 04/02/24 0759 04/19/24 0940 05/24/24 0850 06/14/24 0854       Program Oxygen  Prescription   Program Oxygen  Prescription None None None None      Home Oxygen    Home Oxygen  Device Home Concentrator Home Concentrator Home Concentrator Home Concentrator    Sleep Oxygen  Prescription Continuous Continuous Continuous Continuous    Liters per minute 2.5 2.5 2.5 2.5    Home Exercise Oxygen  Prescription None None None None    Home Resting Oxygen  Prescription None None None None    Compliance with Home Oxygen  Use Yes Yes Yes Yes      Goals/Expected Outcomes   Short Term Goals To learn and exhibit compliance with exercise, home and travel O2 prescription;To learn and understand importance of maintaining oxygen  saturations>88%;To learn and  demonstrate proper use of respiratory medications;To learn and understand importance of monitoring SPO2 with pulse oximeter and demonstrate accurate use of the pulse oximeter.;To learn and demonstrate proper pursed lip breathing techniques or other breathing techniques.  To learn and exhibit compliance with exercise, home and travel O2 prescription;To learn and understand importance of maintaining oxygen  saturations>88%;To learn and demonstrate proper use of respiratory medications;To learn and understand importance of monitoring SPO2 with pulse oximeter and demonstrate accurate use of the pulse oximeter.;To learn and demonstrate proper pursed lip breathing techniques or other breathing techniques.  To learn and exhibit compliance with exercise, home and travel O2 prescription;To learn and understand importance of maintaining oxygen  saturations>88%;To learn and  demonstrate proper use of respiratory medications;To learn and understand importance of monitoring SPO2 with pulse oximeter and demonstrate accurate use of the pulse oximeter.;To learn and demonstrate proper pursed lip breathing techniques or other breathing techniques.  To learn and exhibit compliance with exercise, home and travel O2 prescription;To learn and understand importance of maintaining oxygen  saturations>88%;To learn and demonstrate proper use of respiratory medications;To learn and understand importance of monitoring SPO2 with pulse oximeter and demonstrate accurate use of the pulse oximeter.;To learn and demonstrate proper pursed lip breathing techniques or other breathing techniques.     Long  Term Goals Exhibits compliance with exercise, home  and travel O2 prescription;Maintenance of O2 saturations>88%;Compliance with respiratory medication;Verbalizes importance of monitoring SPO2 with pulse oximeter and return demonstration;Exhibits proper breathing techniques, such as pursed lip breathing or other method taught during program session;Demonstrates proper use of MDIs Exhibits compliance with exercise, home  and travel O2 prescription;Maintenance of O2 saturations>88%;Compliance with respiratory medication;Verbalizes importance of monitoring SPO2 with pulse oximeter and return demonstration;Exhibits proper breathing techniques, such as pursed lip breathing or other method taught during program session;Demonstrates proper use of MDIs Exhibits compliance with exercise, home  and travel O2 prescription;Maintenance of O2 saturations>88%;Compliance with respiratory medication;Verbalizes importance of monitoring SPO2 with pulse oximeter and return demonstration;Exhibits proper breathing techniques, such as pursed lip breathing or other method taught during program session;Demonstrates proper use of MDIs Exhibits compliance with exercise, home  and travel O2 prescription;Maintenance of O2  saturations>88%;Compliance with respiratory medication;Verbalizes importance of monitoring SPO2 with pulse oximeter and return demonstration;Exhibits proper breathing techniques, such as pursed lip breathing or other method taught during program session;Demonstrates proper use of MDIs    Goals/Expected Outcomes Compliance and understanding of oxygen  saturation monitoring and breathing techniques to decrease shortness of breath. Compliance and understanding of oxygen  saturation monitoring and breathing techniques to decrease shortness of breath. Compliance and understanding of oxygen  saturation monitoring and breathing techniques to decrease shortness of breath. Compliance and understanding of oxygen  saturation monitoring and breathing techniques to decrease shortness of breath.       Oxygen  Discharge (Final Oxygen  Re-Evaluation):  Oxygen  Re-Evaluation - 06/14/24 0854       Program Oxygen  Prescription   Program Oxygen  Prescription None      Home Oxygen    Home Oxygen  Device Home Concentrator    Sleep Oxygen  Prescription Continuous    Liters per minute 2.5    Home Exercise Oxygen  Prescription None    Home Resting Oxygen  Prescription None    Compliance with Home Oxygen  Use Yes      Goals/Expected Outcomes   Short Term Goals To learn and exhibit compliance with exercise, home and travel O2 prescription;To learn and understand importance of maintaining oxygen  saturations>88%;To learn and demonstrate proper use of respiratory medications;To learn and understand importance of monitoring SPO2  with pulse oximeter and demonstrate accurate use of the pulse oximeter.;To learn and demonstrate proper pursed lip breathing techniques or other breathing techniques.     Long  Term Goals Exhibits compliance with exercise, home  and travel O2 prescription;Maintenance of O2 saturations>88%;Compliance with respiratory medication;Verbalizes importance of monitoring SPO2 with pulse oximeter and return  demonstration;Exhibits proper breathing techniques, such as pursed lip breathing or other method taught during program session;Demonstrates proper use of MDIs    Goals/Expected Outcomes Compliance and understanding of oxygen  saturation monitoring and breathing techniques to decrease shortness of breath.          Initial Exercise Prescription:  Initial Exercise Prescription - 04/01/24 1200       Date of Initial Exercise RX and Referring Provider   Date 04/01/24    Referring Provider Tamea    Expected Discharge Date 07/09/24      Treadmill   MPH 1.4    Grade 0    Minutes 15      Recumbant Elliptical   Level 1    RPM 60    Watts 80    Minutes 15    METs 2      Prescription Details   Frequency (times per week) 2    Duration Progress to 30 minutes of continuous aerobic without signs/symptoms of physical distress      Intensity   THRR 40-80% of Max Heartrate 61-122    Ratings of Perceived Exertion 11-13    Perceived Dyspnea 0-4      Progression   Progression Continue to progress workloads to maintain intensity without signs/symptoms of physical distress.      Resistance Training   Training Prescription Yes    Weight blue bands    Reps 10-15          Perform Capillary Blood Glucose checks as needed.  Exercise Prescription Changes:   Exercise Prescription Changes     Row Name 04/16/24 1200 04/30/24 1200 05/14/24 1100 05/28/24 1200 06/11/24 1100     Response to Exercise   Blood Pressure (Admit) 100/64 116/60 118/60 100/56 98/66   Blood Pressure (Exercise) 132/70 154/84 136/62 140/72 134/70   Blood Pressure (Exit) 112/66 118/68 110/62 118/70 100/66   Heart Rate (Admit) 97 bpm 97 bpm 100 bpm 104 bpm 98 bpm   Heart Rate (Exercise) 122 bpm 132 bpm 114 bpm 121 bpm 123 bpm   Heart Rate (Exit) 104 bpm 104 bpm 101 bpm 102 bpm 97 bpm   Oxygen  Saturation (Admit) 95 % 97 % 95 % 95 % 94 %   Oxygen  Saturation (Exercise) 93 % 92 % 93 % 92 % 93 %   Oxygen  Saturation  (Exit) 94 % 95 % 95 % 93 % 95 %   Rating of Perceived Exertion (Exercise) 11 11 12 11 10    Perceived Dyspnea (Exercise) 1 1 1 1 1    Duration Continue with 30 min of aerobic exercise without signs/symptoms of physical distress. Continue with 30 min of aerobic exercise without signs/symptoms of physical distress. Continue with 30 min of aerobic exercise without signs/symptoms of physical distress. Continue with 30 min of aerobic exercise without signs/symptoms of physical distress. Continue with 30 min of aerobic exercise without signs/symptoms of physical distress.   Intensity THRR unchanged THRR unchanged THRR unchanged THRR unchanged THRR unchanged     Progression   Progression Continue to progress workloads to maintain intensity without signs/symptoms of physical distress. Continue to progress workloads to maintain intensity without signs/symptoms of physical distress. Continue  to progress workloads to maintain intensity without signs/symptoms of physical distress. Continue to progress workloads to maintain intensity without signs/symptoms of physical distress. Continue to progress workloads to maintain intensity without signs/symptoms of physical distress.     Resistance Training   Training Prescription Yes Yes Yes Yes Yes   Weight blue bands blue bands black bands black bands black bands   Reps 10-15 10-15 10-15 10-15 10-15   Time 10 Minutes 10 Minutes 10 Minutes 10 Minutes 10 Minutes     Treadmill   MPH 2 2.3 2.3 2.7 2.7   Grade 0 2 2 2.5 2   Minutes 15 15 15 15 15    METs 2.4 3.2 3.2 3.7 3.7     Recumbant Elliptical   Level 2 3 4 4 4    RPM -- -- 45 51 --   Watts -- -- -- 66 --   Minutes 15 15 15 15 15    METs 4 4.3 3.2 3.2 4.3    Row Name 06/25/24 1100             Response to Exercise   Blood Pressure (Admit) 116/62       Blood Pressure (Exercise) 150/80       Blood Pressure (Exit) 100/68       Heart Rate (Admit) 105 bpm       Heart Rate (Exercise) 129 bpm       Heart Rate  (Exit) 105 bpm       Oxygen  Saturation (Admit) 95 %       Oxygen  Saturation (Exercise) 92 %       Oxygen  Saturation (Exit) 96 %       Rating of Perceived Exertion (Exercise) 10       Perceived Dyspnea (Exercise) 0       Duration Continue with 30 min of aerobic exercise without signs/symptoms of physical distress.       Intensity THRR unchanged         Progression   Progression Continue to progress workloads to maintain intensity without signs/symptoms of physical distress.         Resistance Training   Weight black bands       Reps 10-15       Time 10 Minutes         Treadmill   MPH 2.7       Grade 2       Minutes 15       METs 3.7         Recumbant Elliptical   Level 4       Minutes 15       METs 4.3          Exercise Comments:   Exercise Goals and Review:   Exercise Goals     Row Name 04/01/24 1050             Exercise Goals   Increase Physical Activity Yes       Intervention Provide advice, education, support and counseling about physical activity/exercise needs.;Develop an individualized exercise prescription for aerobic and resistive training based on initial evaluation findings, risk stratification, comorbidities and participant's personal goals.       Expected Outcomes Short Term: Attend rehab on a regular basis to increase amount of physical activity.;Long Term: Add in home exercise to make exercise part of routine and to increase amount of physical activity.;Long Term: Exercising regularly at least 3-5 days a week.       Increase Strength and  Stamina Yes       Intervention Provide advice, education, support and counseling about physical activity/exercise needs.;Develop an individualized exercise prescription for aerobic and resistive training based on initial evaluation findings, risk stratification, comorbidities and participant's personal goals.       Expected Outcomes Short Term: Increase workloads from initial exercise prescription for resistance, speed,  and METs.;Short Term: Perform resistance training exercises routinely during rehab and add in resistance training at home;Long Term: Improve cardiorespiratory fitness, muscular endurance and strength as measured by increased METs and functional capacity ( )       Able to understand and use rate of perceived exertion (RPE) scale Yes       Intervention Provide education and explanation on how to use RPE scale       Expected Outcomes Short Term: Able to use RPE daily in rehab to express subjective intensity level;Long Term:  Able to use RPE to guide intensity level when exercising independently       Able to understand and use Dyspnea scale Yes       Intervention Provide education and explanation on how to use Dyspnea scale       Expected Outcomes Short Term: Able to use Dyspnea scale daily in rehab to express subjective sense of shortness of breath during exertion;Long Term: Able to use Dyspnea scale to guide intensity level when exercising independently       Knowledge and understanding of Target Heart Rate Range (THRR) Yes       Intervention Provide education and explanation of THRR including how the numbers were predicted and where they are located for reference       Expected Outcomes Short Term: Able to state/look up THRR;Long Term: Able to use THRR to govern intensity when exercising independently;Short Term: Able to use daily as guideline for intensity in rehab       Understanding of Exercise Prescription Yes       Intervention Provide education, explanation, and written materials on patient's individual exercise prescription       Expected Outcomes Short Term: Able to explain program exercise prescription;Long Term: Able to explain home exercise prescription to exercise independently          Exercise Goals Re-Evaluation :  Exercise Goals Re-Evaluation     Row Name 04/02/24 0750 04/19/24 0938 05/24/24 0848 06/14/24 0852       Exercise Goal Re-Evaluation   Exercise Goals Review  Increase Physical Activity;Able to understand and use Dyspnea scale;Understanding of Exercise Prescription;Increase Strength and Stamina;Knowledge and understanding of Target Heart Rate Range (THRR);Able to understand and use rate of perceived exertion (RPE) scale Increase Physical Activity;Able to understand and use Dyspnea scale;Understanding of Exercise Prescription;Increase Strength and Stamina;Knowledge and understanding of Target Heart Rate Range (THRR);Able to understand and use rate of perceived exertion (RPE) scale Increase Physical Activity;Able to understand and use Dyspnea scale;Understanding of Exercise Prescription;Increase Strength and Stamina;Knowledge and understanding of Target Heart Rate Range (THRR);Able to understand and use rate of perceived exertion (RPE) scale Increase Physical Activity;Able to understand and use Dyspnea scale;Understanding of Exercise Prescription;Increase Strength and Stamina;Knowledge and understanding of Target Heart Rate Range (THRR);Able to understand and use rate of perceived exertion (RPE) scale    Comments Jontay is scheduled to begin exercise on 10/7. Will continue to monitor and progress as able. Ronson has completed 4 exercise sessions. He exercises for 15 min on the Octane and treadmill. He averages 3.4 METs at level 3 on the Octane and 2.6 METs at 2 mph  and 1% incline on the treadmill. Vahan performs the warmup and cooldown standing without limitations. He has increased his level on the Octane as METs have increased. His speed and incline have increased on the treadmil. Panfilo is making good progress thus far. Will continue to monitor and progress as able. Thor has completed 14 exercise sessions. He exercises for 15 min on the Octane and treadmill. He averages 4.3 METs at level 4 on the Octane and 3.7 METs at 2.7 mph and 2% incline on the treadmill. Musa performs the warmup and cooldown standing without limitations. He continues to increase his level on the  Octane and speed and incline on the treadmill. Jule seems motivated to exercise. Will continue to monitor and progress as able. Meredith has completed 19 exercise sessions. He exercises for 15 min on the Octane and treadmill. He averages 4.4 METs at level 4 on the Octane and 3.5 METs at 2.6 mph and 2% incline on the treadmill. Naethan performs the warmup and cooldown standing without limitations.Medardo has not increased his level on the Octane and speed and incline on the treadmill. I am unsure why. Will continue to monitor and progress as able.    Expected Outcomes Through exercise at rehab and home, the patient will decrease shortness of breath with daily activities and feel confident in carrying out an exercise regimen at home. Through exercise at rehab and home, the patient will decrease shortness of breath with daily activities and feel confident in carrying out an exercise regimen at home. Through exercise at rehab and home, the patient will decrease shortness of breath with daily activities and feel confident in carrying out an exercise regimen at home. Through exercise at rehab and home, the patient will decrease shortness of breath with daily activities and feel confident in carrying out an exercise regimen at home.       Discharge Exercise Prescription (Final Exercise Prescription Changes):  Exercise Prescription Changes - 06/25/24 1100       Response to Exercise   Blood Pressure (Admit) 116/62    Blood Pressure (Exercise) 150/80    Blood Pressure (Exit) 100/68    Heart Rate (Admit) 105 bpm    Heart Rate (Exercise) 129 bpm    Heart Rate (Exit) 105 bpm    Oxygen  Saturation (Admit) 95 %    Oxygen  Saturation (Exercise) 92 %    Oxygen  Saturation (Exit) 96 %    Rating of Perceived Exertion (Exercise) 10    Perceived Dyspnea (Exercise) 0    Duration Continue with 30 min of aerobic exercise without signs/symptoms of physical distress.    Intensity THRR unchanged      Progression   Progression  Continue to progress workloads to maintain intensity without signs/symptoms of physical distress.      Resistance Training   Weight black bands    Reps 10-15    Time 10 Minutes      Treadmill   MPH 2.7    Grade 2    Minutes 15    METs 3.7      Recumbant Elliptical   Level 4    Minutes 15    METs 4.3          Nutrition:  Target Goals: Understanding of nutrition guidelines, daily intake of sodium 1500mg , cholesterol 200mg , calories 30% from fat and 7% or less from saturated fats, daily to have 5 or more servings of fruits and vegetables.  Biometrics:  Pre Biometrics - 04/01/24 1048  Pre Biometrics   Grip Strength 26 kg           Nutrition Therapy Plan and Nutrition Goals:   Nutrition Assessments:  MEDIFICTS Score Key: >=70 Need to make dietary changes  40-70 Heart Healthy Diet <= 40 Therapeutic Level Cholesterol Diet   Picture Your Plate Scores: <59 Unhealthy dietary pattern with much room for improvement. 41-50 Dietary pattern unlikely to meet recommendations for good health and room for improvement. 51-60 More healthful dietary pattern, with some room for improvement.  >60 Healthy dietary pattern, although there may be some specific behaviors that could be improved.    Nutrition Goals Re-Evaluation:   Nutrition Goals Discharge (Final Nutrition Goals Re-Evaluation):   Psychosocial: Target Goals: Acknowledge presence or absence of significant depression and/or stress, maximize coping skills, provide positive support system. Participant is able to verbalize types and ability to use techniques and skills needed for reducing stress and depression.  Initial Review & Psychosocial Screening:  Initial Psych Review & Screening - 04/01/24 1052       Initial Review   Current issues with None Identified      Family Dynamics   Good Support System? Yes    Comments spouse and daughter      Barriers   Psychosocial barriers to participate in program  There are no identifiable barriers or psychosocial needs.      Screening Interventions   Interventions Encouraged to exercise          Quality of Life Scores:  Scores of 19 and below usually indicate a poorer quality of life in these areas.  A difference of  2-3 points is a clinically meaningful difference.  A difference of 2-3 points in the total score of the Quality of Life Index has been associated with significant improvement in overall quality of life, self-image, physical symptoms, and general health in studies assessing change in quality of life.  PHQ-9: Review Flowsheet  More data exists      04/01/2024 01/11/2024 12/21/2023 10/24/2023 10/03/2023  Depression screen PHQ 2/9  Decreased Interest 0 0 0 0 0  Down, Depressed, Hopeless 0 0 0 0 -  PHQ - 2 Score 0 0 0 0 0  Altered sleeping 0 0 - - -  Tired, decreased energy 0 3 - - -  Change in appetite 0 0 - - -  Feeling bad or failure about yourself  0 0 - - -  Trouble concentrating 0 1 - - -  Moving slowly or fidgety/restless 0 0 - - -  Suicidal thoughts 0 0 - - -  PHQ-9 Score 0  4  - - -  Difficult doing work/chores Not difficult at all Not difficult at all - - -    Details       Data saved with a previous flowsheet row definition        Interpretation of Total Score  Total Score Depression Severity:  1-4 = Minimal depression, 5-9 = Mild depression, 10-14 = Moderate depression, 15-19 = Moderately severe depression, 20-27 = Severe depression   Psychosocial Evaluation and Intervention:  Psychosocial Evaluation - 04/01/24 1052       Psychosocial Evaluation & Interventions   Interventions Encouraged to exercise with the program and follow exercise prescription    Comments Uzziel denies any psychosocial barriers at this time.    Expected Outcomes For Pavle to participate in PR free of psychosocial concerns.    Continue Psychosocial Services  No Follow up required  Psychosocial Re-Evaluation:  Psychosocial  Re-Evaluation     Row Name 04/02/24 [phone] 04/22/24 1540 05/22/24 1111 06/17/24 1406       Psychosocial Re-Evaluation   Current issues with None Identified None Identified None Identified None Identified    Comments Becky is scheduled to start PR on 04/09/24. No new psychosocial barriers or concerns since orientation. Chioke continues to deny any psy/soc barriers or concerns at this time. He has good support from his family. Parsa continues to deny any psy/soc barriers or concerns at this time. He has good support from his family. No needs at this time. Eddie continues to deny any psy/soc barriers or concerns at this time. He enjoy's coming to class and always has a good attitude. No needs at this time.    Expected Outcomes For Amanda to participate in PR free of any psychosocial barriers or concerns For Manley to participate in PR free of any psychosocial barriers or concerns For Kim to participate in PR free of any psychosocial barriers or concerns For Halil to participate in PR free of any psychosocial barriers or concerns    Interventions Encouraged to attend Pulmonary Rehabilitation for the exercise Encouraged to attend Pulmonary Rehabilitation for the exercise Encouraged to attend Pulmonary Rehabilitation for the exercise Encouraged to attend Pulmonary Rehabilitation for the exercise    Continue Psychosocial Services  No Follow up required No Follow up required No Follow up required No Follow up required       Psychosocial Discharge (Final Psychosocial Re-Evaluation):  Psychosocial Re-Evaluation - 06/17/24 1406       Psychosocial Re-Evaluation   Current issues with None Identified    Comments Gean continues to deny any psy/soc barriers or concerns at this time. He enjoy's coming to class and always has a good attitude. No needs at this time.    Expected Outcomes For Jahmeir to participate in PR free of any psychosocial barriers or concerns    Interventions Encouraged to attend Pulmonary  Rehabilitation for the exercise    Continue Psychosocial Services  No Follow up required          Education: Education Goals: Education classes will be provided on a weekly basis, covering required topics. Participant will state understanding/return demonstration of topics presented.  Learning Barriers/Preferences:  Learning Barriers/Preferences - 04/01/24 1053       Learning Barriers/Preferences   Learning Barriers Language    Learning Preferences None          Education Topics: Know Your Numbers Group instruction that is supported by a PowerPoint presentation. Instructor discusses importance of knowing and understanding resting, exercise, and post-exercise oxygen  saturation, heart rate, and blood pressure. Oxygen  saturation, heart rate, blood pressure, rating of perceived exertion, and dyspnea are reviewed along with a normal range for these values.    Exercise for the Pulmonary Patient Group instruction that is supported by a PowerPoint presentation. Instructor discusses benefits of exercise, core components of exercise, frequency, duration, and intensity of an exercise routine, importance of utilizing pulse oximetry during exercise, safety while exercising, and options of places to exercise outside of rehab.  Flowsheet Row PULMONARY REHAB CHRONIC OBSTRUCTIVE PULMONARY DISEASE from 06/13/2024 in [facility]  Date 06/13/24  Educator EP  Instruction Review Code 1- Verbalizes Understanding    MET Level  Group instruction provided by PowerPoint, verbal discussion, and written material to support subject matter. Instructor reviews what METs are and how to increase METs.  Flowsheet Row PULMONARY  REHAB CHRONIC OBSTRUCTIVE PULMONARY DISEASE from 05/09/2024 in [facility] for [facility], Vascular, & Lung [facility]  Date 05/09/24  Educator EP  Instruction Review Code 1- Verbalizes Understanding    Pulmonary  Medications Verbally interactive group education provided by instructor with focus on inhaled medications and proper administration. Flowsheet Row PULMONARY REHAB CHRONIC OBSTRUCTIVE PULMONARY DISEASE from 06/06/2024 in [facility], Vascular, [facility]  Date 06/06/24  Educator RT  Instruction Review Code 1- Verbalizes Understanding    Anatomy and Physiology of the Respiratory System Group instruction provided by PowerPoint, verbal discussion, and written material to support subject matter. Instructor reviews respiratory cycle and anatomical components of the respiratory system and their functions. Instructor also reviews differences in obstructive and restrictive respiratory diseases with examples of each.  Flowsheet Row PULMONARY REHAB CHRONIC OBSTRUCTIVE PULMONARY DISEASE from 05/23/2024 in [facility] for [facility], Vascular, [facility]  Date 05/23/24  Educator RT  Instruction Review Code 1- Verbalizes Understanding    Oxygen  Safety Group instruction provided by PowerPoint, verbal discussion, and written material to support subject matter. There is an overview of What is Oxygen  and Why do we need it.  Instructor also reviews how to create a safe environment for oxygen  use, the importance of using oxygen  as prescribed, and the risks of noncompliance. There is a brief discussion on traveling with oxygen  and resources the patient may utilize.   Oxygen  Use Group instruction provided by PowerPoint, verbal discussion, and written material to discuss how supplemental oxygen  is prescribed and different types of oxygen  supply systems. Resources for more information are provided.    Breathing Techniques Group instruction that is supported by demonstration and informational handouts. Instructor discusses the benefits of pursed lip and diaphragmatic breathing and detailed demonstration on how to perform both.  Flowsheet Row PULMONARY  REHAB CHRONIC OBSTRUCTIVE PULMONARY DISEASE from 04/11/2024 in [facility] for [facility], Vascular, & Lung Health  Date 04/11/24  Educator RN  Instruction Review Code 1- Verbalizes Understanding     Risk Factor Reduction Group instruction that is supported by a PowerPoint presentation. Instructor discusses the definition of a risk factor, different risk factors for pulmonary disease, and how the heart and lungs work together. Flowsheet Row PULMONARY REHAB CHRONIC OBSTRUCTIVE PULMONARY DISEASE from 05/02/2024 in [facility] for Heart, Vascular, & Lung [facility]  Date 05/02/24  Educator EP  Instruction Review Code 1- Verbalizes Understanding    Pulmonary Diseases Group instruction provided by PowerPoint, verbal discussion, and written material to support subject matter. Instructor gives an overview of the different type of pulmonary diseases. There is also a discussion on risk factors and symptoms as well as ways to manage the diseases. Flowsheet Row PULMONARY REHAB CHRONIC OBSTRUCTIVE PULMONARY DISEASE from 05/16/2024 in [facility] for Heart, Vascular, & Lung Health  Date 05/16/24  Educator RT  Instruction Review Code 1- Verbalizes Understanding    Stress and Energy Conservation Group instruction provided by PowerPoint, verbal discussion, and written material to support subject matter. Instructor gives an overview of stress and the impact it can have on the body. Instructor also reviews ways to reduce stress. There is also a discussion on energy conservation and ways to conserve energy throughout the day. Flowsheet Row PULMONARY REHAB CHRONIC OBSTRUCTIVE PULMONARY DISEASE from 04/18/2024 in [facility] for Heart, Vascular, & Lung Health  Date 04/18/24  Educator RN  Instruction Review Code  1- Verbalizes Understanding    Warning Signs and Symptoms Group instruction provided by PowerPoint, verbal  discussion, and written material to support subject matter. Instructor reviews warning signs and symptoms of stroke, heart attack, cold and flu. Instructor also reviews ways to prevent the spread of infection. Flowsheet Row PULMONARY REHAB CHRONIC OBSTRUCTIVE PULMONARY DISEASE from 04/25/2024 in [facility] for Heart, Vascular, & Lung Health  Date 04/25/24  Educator RN  Instruction Review Code 1- Verbalizes Understanding    Other Education Group or individual verbal, written, or video instructions that support the educational goals of the pulmonary rehab program.    Knowledge Questionnaire Score:  Knowledge Questionnaire Score - 04/01/24 1207       Knowledge Questionnaire Score   Pre Score 15/18          Core Components/Risk Factors/Patient Goals at Admission:  Personal Goals and Risk Factors at Admission - 04/01/24 1053       Core Components/Risk Factors/Patient Goals on Admission   Improve shortness of breath with ADL's Yes    Intervention Provide education, individualized exercise plan and daily activity instruction to help decrease symptoms of SOB with activities of daily living.    Expected Outcomes Short Term: Improve cardiorespiratory fitness to achieve a reduction of symptoms when performing ADLs;Long Term: Be able to perform more ADLs without symptoms or delay the onset of symptoms          Core Components/Risk Factors/Patient Goals Review:   Goals and Risk Factor Review     Row Name 04/02/24 0940 04/22/24 1540 05/22/24 1112 06/17/24 1407       Core Components/Risk Factors/Patient Goals Review   Personal Goals Review Improve shortness of breath with ADL's;Develop more efficient breathing techniques such as purse lipped breathing and diaphragmatic breathing and practicing self-pacing with activity. Improve shortness of breath with ADL's;Develop more efficient breathing techniques such as purse lipped breathing and diaphragmatic breathing and  practicing self-pacing with activity. Improve shortness of breath with ADL's;Develop more efficient breathing techniques such as purse lipped breathing and diaphragmatic breathing and practicing self-pacing with activity. Improve shortness of breath with ADL's    Review Kingsley is scheduled to start PR on 04/09/24. Unable to asses goals at this time. Monthly review of patients Core Components/Risk Factors/Patient Goals are as follows: Goal progressing for improving shortness of breath with ADLs. He is currently exercising on RA to keep sats >88%. Goal progressing for developing more efficient breathing techniques such as purse lipped breathing and diaphragmatic breathing; and practicing self-pacing with activity. We will continue to monitor his progress throughout the program. Monthly review of patients Core Components/Risk Factors/Patient Goals are as follows: Goal progressing for improving shortness of breath with ADLs. He is currently exercising on RA to keep sats >88%. Goal met for developing more efficient breathing techniques such as purse lipped breathing and diaphragmatic breathing; and practicing self-pacing with activity.  He has attended the breathing technique class. He is able to demonstrate PLB when he becomes SOB and knows how to self pace based on his RPE/dyspnea scores. We will continue to monitor his progress throughout the program. Monthly review of patients Core Components/Risk Factors/Patient Goals are as follows: Goal progressing for improving shortness of breath with ADLs. He is currently exercising on RA to keep sats >88%. He is currently exercising on the recumbant elliptical and the treadmill. We will continue to monitor his progress throughout the program.    Expected Outcomes To improve shortness of breath with ADL's and develop  more efficient breathing techniques such as purse lipped breathing and diaphragmatic breathing; and practicing self-pacing with activity. To improve  shortness of breath with ADL's and develop more efficient breathing techniques such as purse lipped breathing and diaphragmatic breathing; and practicing self-pacing with activity. To improve shortness of breath with ADL's To improve shortness of breath with ADL's       Core Components/Risk Factors/Patient Goals at Discharge (Final Review):   Goals and Risk Factor Review - 06/17/24 1407       Core Components/Risk Factors/Patient Goals Review   Personal Goals Review Improve shortness of breath with ADL's    Review Monthly review of patients Core Components/Risk Factors/Patient Goals are as follows: Goal progressing for improving shortness of breath with ADLs. He is currently exercising on RA to keep sats >88%. He is currently exercising on the recumbant elliptical and the treadmill. We will continue to monitor his progress throughout the program.    Expected Outcomes To improve shortness of breath with ADL's          ITP Comments:Pt is making expected progress toward Pulmonary Rehab goals after completing 21 session(s). Recommend continued exercise, life style modification, education, and utilization of breathing techniques to increase stamina and strength, while also decreasing shortness of breath with exertion.  Dr. Slater Staff is Medical Director for Pulmonary Rehab at [facility].     Comments:      [1]  Current Outpatient Medications:    albuterol  (PROVENTIL ) (2.5 MG/3ML) 0.083% nebulizer solution, INHALE ONE VIAL VIA NEBULIZER EVERY 6 HOURS AS NEEDED FOR WHEEZING OR FOR SHORTNESS OF BREATH, Disp: 150 mL, Rfl: 1   albuterol  (VENTOLIN  HFA) 108 (90 Base) MCG/ACT inhaler, 2 inhalaciones Inhalaci n Cada 6 horas PRN, sibilancias, dificultad para respirar, Disp: 6.7 g, Rfl: 10   amLODipine  (NORVASC ) 5 MG tablet, Take 1 tablet (5 mg total) by mouth daily. TOMAR 1 TABLETA POR VAI ORAL UNA VEZ AL DIA, Disp: 90 tablet, Rfl: 3   aspirin  EC 81 MG tablet, Take 1 tablet (81 mg total)  by mouth daily. Swallow whole., Disp: 30 tablet, Rfl: 0   atorvastatin  (LIPITOR) 20 MG tablet, Take 1 tablet (20 mg total) by mouth daily. TOMAR 1 TABLETA POR VIA ORAL UNA VEZ AL DIA, Disp: 90 tablet, Rfl: 3   budesonide -glycopyrrolate-formoterol  (BREZTRI  AEROSPHERE) 160-9-4.8 MCG/ACT AERO inhaler, INHALE TWO PUFFS BY MOUTH TWICE A DAY, Disp: 10.7 g, Rfl: 10   Continuous Glucose Sensor (FREESTYLE LIBRE 14 DAY SENSOR) MISC, APPLY ONE SENSOR TO THE BACK OF YOUR UPPER ARM. REPLACE EVERY 14 DAYS., Disp: 1 each, Rfl: 7   cyclobenzaprine  (FLEXERIL ) 10 MG tablet, Take 1 tablet (10 mg total) by mouth 2 (two) times daily as needed for muscle spasms., Disp: 20 tablet, Rfl: 0   Dulaglutide  (TRULICITY ) 1.5 MG/0.5ML SOAJ, INYECTAR CONTENIDOS DE UN LAPIZ POR VIA SUBCUTANEA CADA SEMANA, EL MISMO DIA CADA SEMANA, Disp: 2 mL, Rfl: 10   FARXIGA  10 MG TABS tablet, TOMAR 1 TABLETA POR VIA ORAL UNA VEZ AL DIA, Disp: 90 tablet, Rfl: 3   gabapentin  (NEURONTIN ) 300 MG capsule, Take 1 capsule (300 mg total) by mouth at bedtime as needed., Disp: 60 capsule, Rfl: 0   glipiZIDE  (GLUCOTROL ) 10 MG tablet, TOMAR 1 TABLETA POR VIA ORAL UNA VEZ AL DIA ANTES DEL DESAYUNO *TOMAR 1 TABLETA ADICIONAL SI AZUCAR EN LA SANGRE ESTA ALTA*, Disp: 90 tablet, Rfl: 3   glucose blood test strip, Use as instructed, Disp: 100 each, Rfl: 12   guaifenesin  (  ROBITUSSIN) 100 MG/5ML syrup, Take 200 mg by mouth 3 (three) times daily as needed for cough., Disp: , Rfl:    ibuprofen  (ADVIL ) 800 MG tablet, Take 1 tablet (800 mg total) by mouth every 8 (eight) hours as needed., Disp: 30 tablet, Rfl: 0   insulin  aspart (NOVOLOG  FLEXPEN) 100 UNIT/ML FlexPen, INYECTAR 3 UNIDADES POR VIA SUBCUTANEA TRES VECES AL DIA COMO INDICADO. AJUSTAR CANTIDAD DE INSULINA POR ESCALA MOVIL. MAS DOSIS 30 UNIDADES, Disp: 15 mL, Rfl: 10   mupirocin  ointment (BACTROBAN ) 2 %, Apply 1 Application topically daily. 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  Follow discharge  instructions on paperwork for duration (Patient not taking: Reported on 06/21/2024), Disp: 22 g, Rfl: 0   pantoprazole  (PROTONIX ) 40 MG tablet, Take 1 tablet (40 mg total) by mouth daily. TOMAR 1 TABLETA POR VIA ORAL UNA VEZ AL DIA, Disp: 90 tablet, Rfl: 3   sildenafil  (VIAGRA ) 100 MG tablet, Take 0.5-1 tablets (50-100 mg total) by mouth daily as needed for erectile dysfunction. (Patient not taking: Reported on 06/21/2024), Disp: 5 tablet, Rfl: 11   TRESIBA  FLEXTOUCH 100 UNIT/ML FlexTouch Pen, Inject 30 Units into the skin daily., Disp: 0.3 mL, Rfl: 7 No current facility-administered medications for this encounter.  Facility-Administered Medications Ordered in Other Encounters:    morphine  2 MG/ML injection, , , ,  [2]  Social History Tobacco Use  Smoking Status Every Day   Current packs/day: 1.00   Average packs/day: 1 pack/day for 54.0 years (54.0 ttl pk-yrs)   Types: Cigarettes   Start date: 51  Smokeless Tobacco Never  Tobacco Comments   02-27-2023  per pt daughter pt is still smoking, but hides how much per day,  smoking since age 72 (72)

## 2024-06-28 ENCOUNTER — Emergency Department (HOSPITAL_COMMUNITY)
Admission: EM | Admit: 2024-06-28 | Discharge: 2024-06-28 | Disposition: A | Discharge status: Home / Self Care | Attending: Emergency Medicine | Admitting: Emergency Medicine

## 2024-06-28 ENCOUNTER — Emergency Department (HOSPITAL_COMMUNITY)

## 2024-06-28 ENCOUNTER — Other Ambulatory Visit: Payer: Self-pay

## 2024-06-28 DIAGNOSIS — S62002B Unspecified fracture of navicular [scaphoid] bone of left wrist, initial encounter for open fracture: Secondary | ICD-10-CM | POA: Insufficient documentation

## 2024-06-28 DIAGNOSIS — Z7982 Long term (current) use of aspirin: Secondary | ICD-10-CM | POA: Diagnosis not present

## 2024-06-28 DIAGNOSIS — W108XXA Fall (on) (from) other stairs and steps, initial encounter: Secondary | ICD-10-CM | POA: Insufficient documentation

## 2024-06-28 DIAGNOSIS — S52515A Nondisplaced fracture of left radial styloid process, initial encounter for closed fracture: Secondary | ICD-10-CM | POA: Insufficient documentation

## 2024-06-28 DIAGNOSIS — M25532 Pain in left wrist: Secondary | ICD-10-CM | POA: Diagnosis present

## 2024-06-28 MED ORDER — MORPHINE SULFATE (PF) 4 MG/ML IV SOLN
4.0000 mg | Freq: Once | INTRAVENOUS | Status: AC
  Administered 2024-06-28: 4 mg via INTRAMUSCULAR
  Filled 2024-06-28: qty 1

## 2024-06-28 MED ORDER — OXYCODONE-ACETAMINOPHEN 5-325 MG PO TABS: 1.0000 | ORAL_TABLET | Freq: Four times a day (QID) | ORAL | 0 refills | Status: AC | PRN

## 2024-06-28 MED ORDER — OXYCODONE-ACETAMINOPHEN 5-325 MG PO TABS
2.0000 | ORAL_TABLET | Freq: Once | ORAL | Status: AC
  Administered 2024-06-28: 2 via ORAL
  Filled 2024-06-28: qty 2

## 2024-06-28 NOTE — Progress Notes (Signed)
 Orthopedic Tech Progress Note Patient Details:  Duane Cuevas [birth year] [id number]  Ortho Devices Type of Ortho Device: Thumb spica splint Splint Material: Fiberglass Ortho Device/Splint Location: LUE Ortho Device/Splint Interventions: Application   Post Interventions Patient Tolerated: Well  [name] Duane Cuevas 06/28/2024, 1:57 PM

## 2024-06-28 NOTE — ED Notes (Signed)
 Ortho tech called

## 2024-06-28 NOTE — ED Provider Notes (Signed)
 "  EMERGENCY DEPARTMENT AT [facility] Provider Note   CSN: [id number] Arrival date & time: 06/28/24  1148     Patient presents with: Wrist Pain   Duane Cuevas is a 68 y.o. male who presents with severe pain to the left dorsal wrist after a fall down some stairs at approximately 4:30 to 5 this morning.  Denies any injury to the head or any other pain, but states that he has severe pain to the dorsal left wrist and states is difficult for him to move the forearm and the fingers of the associated extremity secondary to pain.  States that during the fall he tried to brace himself with the affected extremity, and has had pain in the wrist since.    Wrist Pain       Prior to Admission medications  Medication Sig Start Date End Date Taking? Authorizing Provider  oxyCODONE -acetaminophen  (PERCOCET/ROXICET) 5-325 MG tablet Take 1-2 tablets by mouth every 6 (six) hours as needed for up to 5 days for severe pain (pain score 7-10). 06/28/24 07/03/24 Yes Myriam Carrier C, PA  albuterol  (PROVENTIL ) (2.5 MG/3ML) 0.083% nebulizer solution INHALE ONE VIAL VIA NEBULIZER EVERY 6 HOURS AS NEEDED FOR WHEEZING OR FOR SHORTNESS OF BREATH 05/16/24   Sagardia, Miguel Jose, MD  albuterol  (VENTOLIN  HFA) [phone] Base) MCG/ACT inhaler 2 inhalaciones Inhalaci n Cada 6 horas PRN, sibilancias, dificultad para respirar 12/16/23   Rojelio Nest, DO  amLODipine  (NORVASC ) 5 MG tablet Take 1 tablet (5 mg total) by mouth daily. TOMAR 1 TABLETA POR VAI ORAL UNA VEZ AL DIA 01/11/24   Purcell Emil Schanz, MD  aspirin  EC 81 MG tablet Take 1 tablet (81 mg total) by mouth daily. Swallow whole. 09/01/22   Magnant, Carlin CROME, PA-C  atorvastatin  (LIPITOR) 20 MG tablet Take 1 tablet (20 mg total) by mouth daily. TOMAR 1 TABLETA POR VIA ORAL UNA VEZ AL DIA 10/26/23   Purcell Emil Schanz, MD  budesonide -glycopyrrolate-formoterol  (BREZTRI  AEROSPHERE) 160-9-4.8 MCG/ACT AERO inhaler INHALE TWO PUFFS BY MOUTH  TWICE A DAY 03/08/24   Purcell Emil Schanz, MD  Continuous Glucose Sensor (FREESTYLE LIBRE 14 DAY SENSOR) MISC APPLY ONE SENSOR TO THE BACK OF YOUR UPPER ARM. REPLACE EVERY 14 DAYS. 05/10/24   Purcell Emil Schanz, MD  cyclobenzaprine  (FLEXERIL ) 10 MG tablet Take 1 tablet (10 mg total) by mouth 2 (two) times daily as needed for muscle spasms. 09/27/23   Nivia Colon, PA-C  Dulaglutide  (TRULICITY ) 1.5 MG/0.5ML EMMANUEL FLEMINGS CONTENIDOS DE ROLANDA PARKIN POR VIA SUBCUTANEA CADA [place], EL MISMO DIA CADA [facility] 06/21/24   Purcell Emil Schanz, MD  FARXIGA  10 MG TABS tablet TOMAR 1 TABLETA POR VIA ORAL UNA VEZ AL DIA 10/24/23   Purcell Emil Schanz, MD  gabapentin  (NEURONTIN ) 300 MG capsule Take 1 capsule (300 mg total) by mouth at bedtime as needed. 09/24/23   Magnant, Charles L, PA-C  glipiZIDE  (GLUCOTROL ) 10 MG tablet TOMAR 1 TABLETA POR VIA ORAL UNA VEZ AL DIA ANTES DEL DESAYUNO *TOMAR 1 TABLETA ADICIONAL SI AZUCAR EN LA SANGRE ESTA ALTA* 10/24/23   Purcell Emil Schanz, MD  glucose blood test strip Use as instructed 02/14/24   Purcell Emil Schanz, MD  guaifenesin  (ROBITUSSIN) 100 MG/5ML syrup Take 200 mg by mouth 3 (three) times daily as needed for cough.    [provider]  ibuprofen  (ADVIL ) 800 MG tablet Take 1 tablet (800 mg total) by mouth every 8 (eight) hours as needed. 08/25/23   Magnant, Carlin CROME, PA-C  insulin  aspart (NOVOLOG  FLEXPEN) 100 UNIT/ML FlexPen INYECTAR 3 UNIDADES POR VIA SUBCUTANEA TRES VECES AL DIA COMO INDICADO. AJUSTAR CANTIDAD DE INSULINA POR ESCALA MOVIL. MAS DOSIS 30 UNIDADES 08/02/23   Sagardia, Miguel Jose, MD  mupirocin  ointment (BACTROBAN ) 2 % Apply 1 Application topically daily. 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  Follow discharge instructions on paperwork for duration Patient not taking: Reported on 06/21/2024 08/09/23   Magnant, Carlin CROME, PA-C  pantoprazole  (PROTONIX ) 40 MG tablet Take 1 tablet (40 mg total) by mouth daily. TOMAR 1 TABLETA POR VIA  ORAL UNA VEZ AL DIA 01/11/24   Purcell Emil Schanz, MD  sildenafil  (VIAGRA ) 100 MG tablet Take 0.5-1 tablets (50-100 mg total) by mouth daily as needed for erectile dysfunction. Patient not taking: Reported on 06/21/2024 12/29/22   Purcell Emil Schanz, MD  TRESIBA  FLEXTOUCH 100 UNIT/ML FlexTouch Pen Inject 30 Units into the skin daily. 05/23/24   Purcell Emil Schanz, MD    Allergies: Patient has no known allergies.    Review of Systems  Musculoskeletal:  Positive for arthralgias and joint swelling.  All other systems reviewed and are negative.   Updated Vital Signs BP 136/86 (BP Location: Right Arm)   Pulse (!) 101   Temp 99.4 F (37.4 C) (Oral)   Resp 18   Ht 5' 8 (1.727 m)   Wt 76.7 kg   SpO2 99%   BMI 25.70 kg/m   Physical Exam Vitals and nursing note reviewed.  Constitutional:      General: He is awake. He is not in acute distress.    Appearance: Normal appearance. He is well-developed, well-groomed and normal weight.  HENT:     Head: Normocephalic and atraumatic.     Mouth/Throat:     Mouth: Mucous membranes are moist.     Pharynx: Oropharynx is clear.  Eyes:     Extraocular Movements: Extraocular movements intact.     Conjunctiva/sclera: Conjunctivae normal.     Pupils: Pupils are equal, round, and reactive to light.  Neck:     Trachea: Trachea and phonation normal.  Cardiovascular:     Rate and Rhythm: Normal rate and regular rhythm.     Pulses: Normal pulses.          Radial pulses are 2+ on the right side and 2+ on the left side.     Heart sounds: Normal heart sounds. No murmur heard.    No friction rub. No gallop.     Comments: Less than 2-second capillary refill at the fingertips of bilateral hands. Pulmonary:     Effort: Pulmonary effort is normal.     Breath sounds: Normal breath sounds.  Abdominal:     General: Abdomen is flat. Bowel sounds are normal.     Palpations: Abdomen is soft.  Musculoskeletal:     Right wrist: Normal.     Left wrist:  Swelling, tenderness, bony tenderness and snuff box tenderness present. Decreased range of motion.     Cervical back: Full passive range of motion without pain, normal range of motion and neck supple.     Right lower leg: No edema.     Left lower leg: No edema.     Comments: There is notable erythema to the left dorsal wrist, primarily left located on the lateral aspect of the dorsal wrist.  Is unable to flex or extend the wrist secondary to pain and cannot pronate or supinate the forearm secondary to pain.  Lymphadenopathy:  Cervical: No cervical adenopathy.  Skin:    General: Skin is warm and dry.     Capillary Refill: Capillary refill takes less than 2 seconds.  Neurological:     General: No focal deficit present.     Mental Status: He is alert. Mental status is at baseline.  Psychiatric:        Mood and Affect: Mood normal.        Behavior: Behavior is cooperative.     (all labs ordered are listed, but only abnormal results are displayed) Labs Reviewed - No data to display  EKG: None  Radiology: DG Wrist Complete Left Result Date: 06/28/2024 EXAM: 3 OR MORE VIEW(S) XRAY OF THE LEFT WRIST 06/28/2024 12:45:00 PM COMPARISON: None available. CLINICAL HISTORY: injury FINDINGS: BONES AND JOINTS: Remote, nonfused proximal scaphoid fracture. Remote ulnar styloid fracture. Subchondral cysts within the capitate and scaphoid. Moderate radiocarpal and triscaphe joint space loss. Calcification of the triangular fibrocartilage complex, appearing sparkling. No malalignment. SOFT TISSUES: The soft tissues are unremarkable. IMPRESSION: 1. Remote, nonfused proximal scaphoid and remote ulnar styloid fractures. No acute fracture or dislocation. 2. Moderate osteoarthritis of the wrist. Electronically signed by: Rogelia Myers MD 06/28/2024 01:19 PM EST RP Workstation: [id number]     Procedures   Medications Ordered in the ED  morphine  (PF) 4 MG/ML injection 4 mg (has no administration in time  range)  oxyCODONE -acetaminophen  (PERCOCET/ROXICET) 5-325 MG per tablet 2 tablet (2 tablets Oral Given 06/28/24 1230)                                    Medical Decision Making Amount and/or Complexity of Data Reviewed Radiology: ordered.  Risk Prescription drug management.   Medical Decision Making:   Duane Cuevas is a 68 y.o. male who presented to the ED today with left lateral dorsal wrist pain detailed above.     Complete initial physical exam performed, notably the patient  was alert and oriented in no apparent distress.  There is notable swelling and erythema over the dorsal lateral wrist as well as positive snuffbox tenderness..    Reviewed and confirmed nursing documentation for past medical history, family history, social history.    Initial Assessment:   With the patient's presentation of left lateral dorsal wrist pain, most likely diagnosis is possible fracture to the left scaphoid and possible fracture to the distal radius or other carpal bones.  Further consider contusion or sprain of the left wrist.     Initial Plan:  Plain film imaging of the left wrist to assess for acute fracture Manage pain with initial dose of oxycodone  Cold pack application for pain management Objective evaluation as below reviewed   Initial Study Results:    Radiology:  All images reviewed independently. Agree with radiology report at this time.   DG Wrist Complete Left Result Date: 06/28/2024 EXAM: 3 OR MORE VIEW(S) XRAY OF THE LEFT WRIST 06/28/2024 12:45:00 PM COMPARISON: None available. CLINICAL HISTORY: injury FINDINGS: BONES AND JOINTS: Remote, nonfused proximal scaphoid fracture. Remote ulnar styloid fracture. Subchondral cysts within the capitate and scaphoid. Moderate radiocarpal and triscaphe joint space loss. Calcification of the triangular fibrocartilage complex, appearing sparkling. No malalignment. SOFT TISSUES: The soft tissues are unremarkable. IMPRESSION: 1. Remote,  nonfused proximal scaphoid and remote ulnar styloid fractures. No acute fracture or dislocation. 2. Moderate osteoarthritis of the wrist. Electronically signed by: Rogelia Myers MD 06/28/2024 01:19 PM EST RP Workstation: [id number]  Reassessment and Plan:   There is evidence on the x-ray of a proximal scaphoid fracture as well as possible ulnar styloid fracture.  Given this finding we will manage this with a thumb spica, continued oral pain medication and referral to hand surgery for definitive management.  This was discussed with the patient in which they verbalized understanding agreement of no further concerns at this time.  Thus we will discharge patient with outpatient follow-up to hand surgery and outpatient pain medication.  Discussed use of cold pack to the wrist every other hour for 15 to 20 minutes.  He again verbalizes understanding and agreement with this.       Final diagnoses:  Open nondisplaced fracture of scaphoid of left wrist, unspecified portion of scaphoid, initial encounter  Nondisplaced fracture of left radial styloid process, initial encounter for closed fracture    ED Discharge Orders          Ordered    oxyCODONE -acetaminophen  (PERCOCET/ROXICET) 5-325 MG tablet  Every 6 hours PRN        06/28/24 1352               Myriam Dorn BROCKS, PA 06/28/24 1354    Randol Simmonds, MD 06/29/24 [phone]  "

## 2024-06-28 NOTE — ED Triage Notes (Addendum)
 Pt fell down 6 stairs, injury to left wrist. Obvious swelling noted. Pulses present. Denies head injury, denies any other injury, no blood thinners

## 2024-07-01 ENCOUNTER — Telehealth (HOSPITAL_COMMUNITY): Payer: Self-pay
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
## 2024-07-02 ENCOUNTER — Encounter (HOSPITAL_COMMUNITY)

## 2024-07-02 NOTE — Progress Notes (Signed)
 Discharge Progress Report  Patient Details  Name: Duane Cuevas MRN: [id number] Date of Birth: 1956/04/20 Referring Provider:   [name] Walk Test from 04/01/2024 in [facility]  Referring Provider Tamea     Number of Visits: 21  Reason for Discharge:  Early Exit:  fell and broke arm  Smoking History:  Tobacco Use History[1]  Diagnosis:  Stage 4 very severe COPD by GOLD classification [facility])  ADL UCSD:  Pulmonary Assessment Scores     Row Name 04/01/24 1109         ADL UCSD   ADL Phase Entry     SOB Score total 88       CAT Score   CAT Score 22       mMRC Score   mMRC Score 4        Initial Exercise Prescription:  Initial Exercise Prescription - 04/01/24 1200       Date of Initial Exercise RX and Referring Provider   Date 04/01/24    Referring Provider Tamea    Expected Discharge Date 07/09/24      Treadmill   MPH 1.4    Grade 0    Minutes 15      Recumbant Elliptical   Level 1    RPM 60    Watts 80    Minutes 15    METs 2      Prescription Details   Frequency (times per week) 2    Duration Progress to 30 minutes of continuous aerobic without signs/symptoms of physical distress      Intensity   THRR 40-80% of Max Heartrate 61-122    Ratings of Perceived Exertion 11-13    Perceived Dyspnea 0-4      Progression   Progression Continue to progress workloads to maintain intensity without signs/symptoms of physical distress.      Resistance Training   Training Prescription Yes    Weight blue bands    Reps 10-15          Discharge Exercise Prescription (Final Exercise Prescription Changes):  Exercise Prescription Changes - 06/25/24 1100       Response to Exercise   Blood Pressure (Admit) 116/62    Blood Pressure (Exercise) 150/80    Blood Pressure (Exit) 100/68    Heart Rate (Admit) 105 bpm    Heart Rate (Exercise) 129 bpm    Heart Rate (Exit)  105 bpm    Oxygen  Saturation (Admit) 95 %    Oxygen  Saturation (Exercise) 92 %    Oxygen  Saturation (Exit) 96 %    Rating of Perceived Exertion (Exercise) 10    Perceived Dyspnea (Exercise) 0    Duration Continue with 30 min of aerobic exercise without signs/symptoms of physical distress.    Intensity THRR unchanged      Progression   Progression Continue to progress workloads to maintain intensity without signs/symptoms of physical distress.      Resistance Training   Weight black bands    Reps 10-15    Time 10 Minutes      Treadmill   MPH 2.7    Grade 2    Minutes 15    METs 3.7      Recumbant Elliptical   Level 4    Minutes 15    METs 4.3          Functional Capacity:  6 Minute Walk     Row Name 04/01/24 1202  6 Minute Walk   Phase Initial     Distance 1125 feet     Walk Time 6 minutes     # of Rest Breaks 0     MPH 2.13     METS 2.92     RPE 13     Perceived Dyspnea  3     VO2 Peak 10.22     Symptoms No     Resting HR 86 bpm     Resting BP 112/60     Resting Oxygen  Saturation  95 %     Exercise Oxygen  Saturation  during 6 min walk 90 %     Max Ex. HR 111 bpm     Max Ex. BP 126/64     2 Minute Post BP 108/60       Interval HR   1 Minute HR 107     2 Minute HR 104     3 Minute HR 101     4 Minute HR 103     5 Minute HR 111     6 Minute HR 108     2 Minute Post HR 90     Interval Heart Rate? Yes       Interval Oxygen    Interval Oxygen ? Yes     Baseline Oxygen  Saturation % 95 %     1 Minute Oxygen  Saturation % 94 %     1 Minute Liters of Oxygen  0 L     2 Minute Oxygen  Saturation % 93 %     2 Minute Liters of Oxygen  0 L     3 Minute Oxygen  Saturation % 92 %     3 Minute Liters of Oxygen  0 L     4 Minute Oxygen  Saturation % 91 %     4 Minute Liters of Oxygen  0 L     5 Minute Oxygen  Saturation % 91 %     5 Minute Liters of Oxygen  0 L     6 Minute Oxygen  Saturation % 90 %     6 Minute Liters of Oxygen  0 L     2 Minute Post Oxygen   Saturation % 97 %     2 Minute Post Liters of Oxygen  0 L        Psychological, QOL, Others - Outcomes: PHQ 2/9:    04/01/2024   10:55 AM 01/11/2024    9:05 AM 12/21/2023    1:15 PM 10/24/2023    9:28 AM 10/03/2023    9:40 AM  Depression screen PHQ 2/9  Decreased Interest 0 0 0 0 0  Down, Depressed, Hopeless 0 0 0 0   PHQ - 2 Score 0 0 0 0 0  Altered sleeping 0 0     Tired, decreased energy 0 3     Change in appetite 0 0     Feeling bad or failure about yourself  0 0     Trouble concentrating 0 1     Moving slowly or fidgety/restless 0 0     Suicidal thoughts 0 0     PHQ-9 Score 0  4      Difficult doing work/chores Not difficult at all Not difficult at all        Data saved with a previous flowsheet row definition    Quality of Life:   Personal Goals: Goals established at orientation with interventions provided to work toward goal.  Personal Goals and Risk Factors at Admission - 04/01/24 1053  Core Components/Risk Factors/Patient Goals on Admission   Improve shortness of breath with ADL's Yes    Intervention Provide education, individualized exercise plan and daily activity instruction to help decrease symptoms of SOB with activities of daily living.    Expected Outcomes Short Term: Improve cardiorespiratory fitness to achieve a reduction of symptoms when performing ADLs;Long Term: Be able to perform more ADLs without symptoms or delay the onset of symptoms           Personal Goals Discharge:  Goals and Risk Factor Review     Row Name 04/02/24 0940 04/22/24 1540 05/22/24 1112 06/17/24 1407 07/02/24 1555     Core Components/Risk Factors/Patient Goals Review   Personal Goals Review Improve shortness of breath with ADL's;Develop more efficient breathing techniques such as purse lipped breathing and diaphragmatic breathing and practicing self-pacing with activity. Improve shortness of breath with ADL's;Develop more efficient breathing techniques such as purse lipped  breathing and diaphragmatic breathing and practicing self-pacing with activity. Improve shortness of breath with ADL's;Develop more efficient breathing techniques such as purse lipped breathing and diaphragmatic breathing and practicing self-pacing with activity. Improve shortness of breath with ADL's Improve shortness of breath with ADL's   Review Duane Cuevas is scheduled to start PR on 04/09/24. Unable to asses goals at this time. Monthly review of patients Core Components/Risk Factors/Patient Goals are as follows: Goal progressing for improving shortness of breath with ADLs. He is currently exercising on RA to keep sats >88%. Goal progressing for developing more efficient breathing techniques such as purse lipped breathing and diaphragmatic breathing; and practicing self-pacing with activity. We will continue to monitor his progress throughout the program. Monthly review of patients Core Components/Risk Factors/Patient Goals are as follows: Goal progressing for improving shortness of breath with ADLs. He is currently exercising on RA to keep sats >88%. Goal met for developing more efficient breathing techniques such as purse lipped breathing and diaphragmatic breathing; and practicing self-pacing with activity.  He has attended the breathing technique class. He is able to demonstrate PLB when he becomes SOB and knows how to self pace based on his RPE/dyspnea scores. We will continue to monitor his progress throughout the program. Monthly review of patients Core Components/Risk Factors/Patient Goals are as follows: Goal progressing for improving shortness of breath with ADLs. He is currently exercising on RA to keep sats >88%. He is currently exercising on the recumbant elliptical and the treadmill. We will continue to monitor his progress throughout the program. Duane Cuevas discharged from the program on 06/30/24 completing 21 sessions due to a accident. Discharge review of patients Core Components/Risk  Factors/Patient Goals are as follows: Goal met for improving shortness of breath with ADLs. Duane Cuevas was able to exercise on RA to keep sats >88%. He exercised on the recumbent elliptical and the treadmill. He was able to improve his shortness of breath and built up his endurance and stamina.   Expected Outcomes To improve shortness of breath with ADL's and develop more efficient breathing techniques such as purse lipped breathing and diaphragmatic breathing; and practicing self-pacing with activity. To improve shortness of breath with ADL's and develop more efficient breathing techniques such as purse lipped breathing and diaphragmatic breathing; and practicing self-pacing with activity. To improve shortness of breath with ADL's To improve shortness of breath with ADL's To continue to improve shortness of breath with ADL's post Pulm Rehab      Exercise Goals and Review:  Exercise Goals     Row Name 04/01/24 1050  Exercise Goals   Increase Physical Activity Yes       Intervention Provide advice, education, support and counseling about physical activity/exercise needs.;Develop an individualized exercise prescription for aerobic and resistive training based on initial evaluation findings, risk stratification, comorbidities and participant's personal goals.       Expected Outcomes Short Term: Attend rehab on a regular basis to increase amount of physical activity.;Long Term: Add in home exercise to make exercise part of routine and to increase amount of physical activity.;Long Term: Exercising regularly at least 3-5 days a week.       Increase Strength and Stamina Yes       Intervention Provide advice, education, support and counseling about physical activity/exercise needs.;Develop an individualized exercise prescription for aerobic and resistive training based on initial evaluation findings, risk stratification, comorbidities and participant's personal goals.       Expected Outcomes Short  Term: Increase workloads from initial exercise prescription for resistance, speed, and METs.;Short Term: Perform resistance training exercises routinely during rehab and add in resistance training at home;Long Term: Improve cardiorespiratory fitness, muscular endurance and strength as measured by increased METs and functional capacity ( )       Able to understand and use rate of perceived exertion (RPE) scale Yes       Intervention Provide education and explanation on how to use RPE scale       Expected Outcomes Short Term: Able to use RPE daily in rehab to express subjective intensity level;Long Term:  Able to use RPE to guide intensity level when exercising independently       Able to understand and use Dyspnea scale Yes       Intervention Provide education and explanation on how to use Dyspnea scale       Expected Outcomes Short Term: Able to use Dyspnea scale daily in rehab to express subjective sense of shortness of breath during exertion;Long Term: Able to use Dyspnea scale to guide intensity level when exercising independently       Knowledge and understanding of Target Heart Rate Range (THRR) Yes       Intervention Provide education and explanation of THRR including how the numbers were predicted and where they are located for reference       Expected Outcomes Short Term: Able to state/look up THRR;Long Term: Able to use THRR to govern intensity when exercising independently;Short Term: Able to use daily as guideline for intensity in rehab       Understanding of Exercise Prescription Yes       Intervention Provide education, explanation, and written materials on patient's individual exercise prescription       Expected Outcomes Short Term: Able to explain program exercise prescription;Long Term: Able to explain home exercise prescription to exercise independently          Exercise Goals Re-Evaluation:  Exercise Goals Re-Evaluation     Row Name 04/02/24 0750 04/19/24 0938 05/24/24 0848  06/14/24 0852 07/01/24 1009     Exercise Goal Re-Evaluation   Exercise Goals Review Increase Physical Activity;Able to understand and use Dyspnea scale;Understanding of Exercise Prescription;Increase Strength and Stamina;Knowledge and understanding of Target Heart Rate Range (THRR);Able to understand and use rate of perceived exertion (RPE) scale Increase Physical Activity;Able to understand and use Dyspnea scale;Understanding of Exercise Prescription;Increase Strength and Stamina;Knowledge and understanding of Target Heart Rate Range (THRR);Able to understand and use rate of perceived exertion (RPE) scale Increase Physical Activity;Able to understand and use Dyspnea scale;Understanding of Exercise Prescription;Increase Strength and Stamina;Knowledge  and understanding of Target Heart Rate Range (THRR);Able to understand and use rate of perceived exertion (RPE) scale Increase Physical Activity;Able to understand and use Dyspnea scale;Understanding of Exercise Prescription;Increase Strength and Stamina;Knowledge and understanding of Target Heart Rate Range (THRR);Able to understand and use rate of perceived exertion (RPE) scale Increase Physical Activity;Able to understand and use Dyspnea scale;Understanding of Exercise Prescription;Increase Strength and Stamina;Knowledge and understanding of Target Heart Rate Range (THRR);Able to understand and use rate of perceived exertion (RPE) scale   Comments Duane Cuevas is scheduled to begin exercise on 10/7. Will continue to monitor and progress as able. Duane Cuevas has completed 4 exercise sessions. He exercises for 15 min on the Octane and treadmill. He averages 3.4 METs at level 3 on the Octane and 2.6 METs at 2 mph and 1% incline on the treadmill. Duane Cuevas performs the warmup and cooldown standing without limitations. He has increased his level on the Octane as METs have increased. His speed and incline have increased on the treadmil. Duane Cuevas is making good progress thus far. Will  continue to monitor and progress as able. Duane Cuevas has completed 14 exercise sessions. He exercises for 15 min on the Octane and treadmill. He averages 4.3 METs at level 4 on the Octane and 3.7 METs at 2.7 mph and 2% incline on the treadmill. Duane Cuevas performs the warmup and cooldown standing without limitations. He continues to increase his level on the Octane and speed and incline on the treadmill. Duane Cuevas seems motivated to exercise. Will continue to monitor and progress as able. Duane Cuevas has completed 19 exercise sessions. He exercises for 15 min on the Octane and treadmill. He averages 4.4 METs at level 4 on the Octane and 3.5 METs at 2.6 mph and 2% incline on the treadmill. Duane Cuevas performs the warmup and cooldown standing without limitations.Duane Cuevas has not increased his level on the Octane and speed and incline on the treadmill. I am unsure why. Will continue to monitor and progress as able. Duane Cuevas has completed 21 exercise sessions. Peak METs were 4.4 on the Octane and 3.7 METs on the treadmill. Pt is being discharged due to injury not related to rehab.   Expected Outcomes Through exercise at rehab and home, the patient will decrease shortness of breath with daily activities and feel confident in carrying out an exercise regimen at home. Through exercise at rehab and home, the patient will decrease shortness of breath with daily activities and feel confident in carrying out an exercise regimen at home. Through exercise at rehab and home, the patient will decrease shortness of breath with daily activities and feel confident in carrying out an exercise regimen at home. Through exercise at rehab and home, the patient will decrease shortness of breath with daily activities and feel confident in carrying out an exercise regimen at home. Through exercise at rehab and home, the patient will decrease shortness of breath with daily activities and feel confident in carrying out an exercise regimen at home.      Nutrition &  Weight - Outcomes:  Pre Biometrics - 04/01/24 1048       Pre Biometrics   Grip Strength 26 kg           Nutrition:   Nutrition Discharge:   Education Questionnaire Score:  Knowledge Questionnaire Score - 04/01/24 1207       Knowledge Questionnaire Score   Pre Score 15/18         Duane Cuevas discharged from the program on 12/29 completing 21 sessions due to an accident.  At the last psy/soc assessment Duane Cuevas denied any psy/soc barriers or concerns. Duane Cuevas has great support from his family.   Discharge review of patient's Core Components/Risk Factors/Patient Goals are as follows: Goal met for improving shortness of breath with ADL's. Duane Cuevas was able to exercise on RA to keep sats >88%. He exercised on the recumbent elliptical and the treadmill. He was able to improve his shortness of breath and built up his endurance and stamina.     [1]  Social History Tobacco Use  Smoking Status Every Day   Current packs/day: 1.00   Average packs/day: 1 pack/day for 54.0 years (54.0 ttl pk-yrs)   Types: Cigarettes   Start date: 45  Smokeless Tobacco Never  Tobacco Comments   02-27-2023  per pt daughter pt is still smoking, but hides how much per day,  smoking since age 85 (3)

## 2024-07-09 ENCOUNTER — Encounter (HOSPITAL_COMMUNITY)

## 2024-07-11 ENCOUNTER — Encounter (HOSPITAL_COMMUNITY)

## 2024-07-15 ENCOUNTER — Other Ambulatory Visit (HOSPITAL_COMMUNITY): Payer: Self-pay

## 2024-07-15 ENCOUNTER — Telehealth: Payer: Self-pay

## 2024-07-15 NOTE — Telephone Encounter (Signed)
 Pharmacy Patient Advocate Encounter   Received notification from [employer] CMM KEY that prior authorization for freestyle libre sensor 2 is required/requested.   Insurance verification completed.   The patient is insured through [employer] [place].   Per test claim: PA required; PA started via CoverMyMeds. KEY [id number] . Waiting for clinical questions to populate.

## 2024-07-16 ENCOUNTER — Encounter (HOSPITAL_COMMUNITY)

## 2024-07-16 ENCOUNTER — Other Ambulatory Visit: Payer: Self-pay | Admitting: Emergency Medicine

## 2024-07-16 MED ORDER — ALBUTEROL SULFATE (2.5 MG/3ML) 0.083% IN NEBU: 2.5000 mg | INHALATION_SOLUTION | Freq: Four times a day (QID) | RESPIRATORY_TRACT | 1 refills | Status: DC | PRN

## 2024-07-16 NOTE — Telephone Encounter (Signed)
 Copied from CRM #[id number]. Topic: Clinical - Medication Refill >> Jul 16, 2024 12:52 PM Mia F wrote: Medication: albuterol  (PROVENTIL ) (2.5 MG/3ML) 0.083% nebulizer solution   Has the patient contacted their pharmacy? Yes (Agent: If no, request that the patient contact the pharmacy for the refill. If patient does not wish to contact the pharmacy document the reason why and proceed with request.) (Agent: If yes, when and what did the pharmacy advise?)  This is the patient's preferred pharmacy:  [id number] [place] - [place], [place] - [address] [place] [place]. [place] [address] Phone: [phone] Fax: [phone]  [facility] [place]  - [place] - [address] [place] [address] Phone: [phone] Fax: [phone]   Is this the correct pharmacy for this prescription? Yes If no, delete pharmacy and type the correct one.   Has the prescription been filled recently? Yes  Is the patient out of the medication? No  Has the patient been seen for an appointment in the last year OR does the patient have an upcoming appointment? Yes  Can we respond through MyChart? Yes  Agent: Please be advised that Rx refills may take up to 3 business days. We ask that you follow-up with your pharmacy.

## 2024-07-16 NOTE — Telephone Encounter (Signed)
 Clinical question have been submitted

## 2024-07-18 ENCOUNTER — Other Ambulatory Visit (HOSPITAL_COMMUNITY): Payer: Self-pay

## 2024-07-18 NOTE — Telephone Encounter (Signed)
 Pharmacy Patient Advocate Encounter  Received notification from [employer] that Prior Authorization for FreeStyle Libre 2 Sensor  has been APPROVED from 07/16/2024 to 07/03/2025. Ran test claim, Copay is $0.00. This test claim was processed through [facility]- copay amounts may vary at other pharmacies due to pharmacy/plan contracts, or as the patient moves through the different stages of their insurance plan.   PA #/Case ID/Reference #: [id number]

## 2024-07-24 ENCOUNTER — Other Ambulatory Visit: Payer: Self-pay

## 2024-07-24 ENCOUNTER — Telehealth: Payer: Self-pay

## 2024-07-24 MED ORDER — ALBUTEROL SULFATE (2.5 MG/3ML) 0.083% IN NEBU: 2.5000 mg | INHALATION_SOLUTION | Freq: Four times a day (QID) | RESPIRATORY_TRACT | 1 refills | Status: DC | PRN

## 2024-07-24 NOTE — Telephone Encounter (Signed)
 Copied from CRM [id number]. Topic: Clinical - Medication Refill >> Jul 24, 2024 10:12 AM Joesph B wrote: Medication: albuterol  (PROVENTIL ) (2.5 MG/3ML) 0.083% nebulizer solution  Has the patient contacted their pharmacy? Yes (Agent: If no, request that the patient contact the pharmacy for the refill. If patient does not wish to contact the pharmacy document the reason why and proceed with request.) (Agent: If yes, when and what did the pharmacy advise?)  This is the patient's preferred pharmacy:    [facility] [place]  - [place], [place] - [address] [place] [address] Phone: [phone] Fax: [phone]  M  Is this the correct pharmacy for this prescription? Yes If no, delete pharmacy and type the correct one.   Has the prescription been filled recently? Yes  Is the patient out of the medication? Yes  Has the patient been seen for an appointment in the last year OR does the patient have an upcoming appointment? Yes  Can we respond through MyChart? Yes  Agent: Please be advised that Rx refills may take up to 3 business days. We ask that you follow-up with your pharmacy.

## 2024-07-24 NOTE — Telephone Encounter (Signed)
 This medication has since been filled

## 2024-07-26 ENCOUNTER — Telehealth: Payer: Self-pay

## 2024-07-26 NOTE — Telephone Encounter (Unsigned)
 Copied from CRM #[id number]. Topic: General - Call Back - No Documentation >> Jul 25, 2024  4:11 PM Leila BROCKS wrote: Reason for CRM: Patient [phone] is using interpreter Eda states received a message from Dr. Tamea 06/20/24 has results, it's showing oxygen  is lowering at night and there's an appointment tomorrow for patient. Patient states just got the message today 20 minutes ago, calling back to find out and cannot come into the office. Informed patient, saw Dr. Tamea 06/21/24. Unable to reach CAL, tried a few times. Phone call dropped while patient was waiting. Called patient back with interpreter.

## 2024-07-28 ENCOUNTER — Emergency Department (HOSPITAL_COMMUNITY)
Admission: EM | Admit: 2024-07-28 | Discharge: 2024-07-28 | Disposition: A | Discharge status: Home / Self Care | Attending: Emergency Medicine | Admitting: Emergency Medicine

## 2024-07-28 ENCOUNTER — Emergency Department (HOSPITAL_COMMUNITY)

## 2024-07-28 DIAGNOSIS — X501XXA Overexertion from prolonged static or awkward postures, initial encounter: Secondary | ICD-10-CM | POA: Insufficient documentation

## 2024-07-28 DIAGNOSIS — Z7982 Long term (current) use of aspirin: Secondary | ICD-10-CM | POA: Insufficient documentation

## 2024-07-28 DIAGNOSIS — S6992XA Unspecified injury of left wrist, hand and finger(s), initial encounter: Secondary | ICD-10-CM | POA: Insufficient documentation

## 2024-07-28 MED ORDER — OXYCODONE HCL 5 MG PO TABS
5.0000 mg | ORAL_TABLET | Freq: Once | ORAL | Status: AC
  Administered 2024-07-28: 5 mg via ORAL
  Filled 2024-07-28: qty 1

## 2024-07-28 MED ORDER — OXYCODONE HCL 5 MG PO CAPS: 5.0000 mg | ORAL_CAPSULE | Freq: Four times a day (QID) | ORAL | 0 refills | Status: AC | PRN

## 2024-07-28 MED ORDER — OXYCODONE HCL 5 MG PO CAPS: 5.0000 mg | ORAL_CAPSULE | Freq: Four times a day (QID) | ORAL | 0 refills | Status: DC | PRN

## 2024-07-28 NOTE — Discharge Instructions (Signed)
 Return for any problem.  Use splint / sling as instructed.   Follow up with orthopedics as instructed.

## 2024-07-28 NOTE — ED Triage Notes (Signed)
 See note from last month - injury to left wrist. Yesterday was carrying a case of water , attempted to catch it, felt a pop in his wrist. Now the area is red, swollen, and has limited ROM.

## 2024-07-28 NOTE — ED Provider Notes (Signed)
 " [facility] AT [facility] Provider Note   CSN: [id number] Arrival date & time: 07/28/24  1122     Patient presents with: Wrist Injury (Left)   Duane Cuevas is a 69 y.o. male.   69 year old male with prior medical history as detailed below presents for evaluation.  Patient was seen at the end of February for injury to the left wrist.    Imaging at that time showed: 1. Remote, nonfused proximal scaphoid and remote ulnar styloid fractures. No acute fracture or dislocation. 2. Moderate osteoarthritis of the wrist.  Patient reports that yesterday he was carrying a case of water .  It slipped and he attempted to catch it.  He felt a pop in his left wrist.  He was not wearing his splint at the time.  His left wrist is now painful and swollen.  He took some Tylenol  without control of his pain.  The history is provided by the patient and medical records.       Prior to Admission medications  Medication Sig Start Date End Date Taking? Authorizing Provider  albuterol  (PROVENTIL ) (2.5 MG/3ML) 0.083% nebulizer solution Take 3 mLs (2.5 mg total) by nebulization every 6 (six) hours as needed for wheezing or shortness of breath. 07/24/24   Purcell Emil Schanz, MD  albuterol  (VENTOLIN  HFA) 108 [id number] Base) MCG/ACT inhaler 2 inhalaciones Inhalaci n Cada 6 horas PRN, sibilancias, dificultad para respirar 12/16/23   Rojelio Nest, DO  amLODipine  (NORVASC ) 5 MG tablet Take 1 tablet (5 mg total) by mouth daily. TOMAR 1 TABLETA POR VAI ORAL UNA VEZ AL DIA 01/11/24   Purcell Emil Schanz, MD  aspirin  EC 81 MG tablet Take 1 tablet (81 mg total) by mouth daily. Swallow whole. 09/01/22   Magnant, Carlin CROME, PA-C  atorvastatin  (LIPITOR) 20 MG tablet Take 1 tablet (20 mg total) by mouth daily. TOMAR 1 TABLETA POR VIA ORAL UNA VEZ AL DIA 10/26/23   Purcell Emil Schanz, MD  budesonide -glycopyrrolate-formoterol  (BREZTRI  AEROSPHERE) 160-9-4.8 MCG/ACT AERO inhaler INHALE TWO PUFFS BY  MOUTH TWICE A DAY 03/08/24   Purcell Emil Schanz, MD  Continuous Glucose Sensor (FREESTYLE LIBRE 14 DAY SENSOR) MISC APPLY ONE SENSOR TO THE BACK OF YOUR UPPER ARM. REPLACE EVERY 14 DAYS. 05/10/24   Purcell Emil Schanz, MD  cyclobenzaprine  (FLEXERIL ) 10 MG tablet Take 1 tablet (10 mg total) by mouth 2 (two) times daily as needed for muscle spasms. 09/27/23   Nivia Colon, PA-C  Dulaglutide  (TRULICITY ) 1.5 MG/0.5ML EMMANUEL FLEMINGS CONTENIDOS DE ROLANDA PARKIN POR VIA SUBCUTANEA CADA Lynch, EL MISMO DIA CADA [facility] 06/21/24   Purcell Emil Schanz, MD  FARXIGA  10 MG TABS tablet TOMAR 1 TABLETA POR VIA ORAL UNA VEZ AL DIA 10/24/23   Purcell Emil Schanz, MD  gabapentin  (NEURONTIN ) 300 MG capsule Take 1 capsule (300 mg total) by mouth at bedtime as needed. 09/24/23   Magnant, Charles L, PA-C  glipiZIDE  (GLUCOTROL ) 10 MG tablet TOMAR 1 TABLETA POR VIA ORAL UNA VEZ AL DIA ANTES DEL DESAYUNO *TOMAR 1 TABLETA ADICIONAL SI AZUCAR EN LA SANGRE ESTA ALTA* 10/24/23   Purcell Emil Schanz, MD  glucose blood test strip Use as instructed 02/14/24   Purcell Emil Schanz, MD  guaifenesin  (ROBITUSSIN) 100 MG/5ML syrup Take 200 mg by mouth 3 (three) times daily as needed for cough.    [provider]  ibuprofen  (ADVIL ) 800 MG tablet Take 1 tablet (800 mg total) by mouth every 8 (eight) hours as needed. 08/25/23   Magnant,  Charles L, PA-C  insulin  aspart (NOVOLOG  FLEXPEN) 100 UNIT/ML FlexPen INYECTAR 3 UNIDADES POR VIA SUBCUTANEA TRES VECES AL DIA COMO INDICADO. AJUSTAR CANTIDAD DE INSULINA POR ESCALA MOVIL. MAS DOSIS 30 UNIDADES 08/02/23   Sagardia, Miguel Jose, MD  mupirocin  ointment (BACTROBAN ) 2 % Apply 1 Application topically daily. 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  Follow discharge instructions on paperwork for duration Patient not taking: Reported on 06/21/2024 08/09/23   Magnant, Carlin CROME, PA-C  pantoprazole  (PROTONIX ) 40 MG tablet Take 1 tablet (40 mg total) by mouth daily. TOMAR 1 TABLETA POR  VIA ORAL UNA VEZ AL DIA 01/11/24   Sagardia, Emil Schanz, MD  sildenafil  (VIAGRA ) 100 MG tablet Take 0.5-1 tablets (50-100 mg total) by mouth daily as needed for erectile dysfunction. Patient not taking: Reported on 06/21/2024 12/29/22   Purcell Emil Schanz, MD  TRESIBA  FLEXTOUCH 100 UNIT/ML FlexTouch Pen Inject 30 Units into the skin daily. 05/23/24   Purcell Emil Schanz, MD    Allergies: Patient has no known allergies.    Review of Systems  All other systems reviewed and are negative.   Updated Vital Signs BP 127/86 (BP Location: Right Arm)   Pulse (!) 115   Temp 98.2 F (36.8 C) (Oral)   Resp 18   SpO2 97%   Physical Exam Vitals and nursing note reviewed.  Constitutional:      General: He is not in acute distress.    Appearance: Normal appearance. He is well-developed.  HENT:     Head: Normocephalic and atraumatic.     Mouth/Throat:     Mouth: Mucous membranes are moist.  Eyes:     Extraocular Movements: Extraocular movements intact.     Conjunctiva/sclera: Conjunctivae normal.     Pupils: Pupils are equal, round, and reactive to light.  Cardiovascular:     Rate and Rhythm: Normal rate and regular rhythm.     Heart sounds: Normal heart sounds.  Pulmonary:     Effort: Pulmonary effort is normal. No respiratory distress.     Breath sounds: Normal breath sounds.  Abdominal:     General: Abdomen is flat. There is no distension.     Palpations: Abdomen is soft.     Tenderness: There is no abdominal tenderness.  Musculoskeletal:        General: Swelling and tenderness present. No deformity. Normal range of motion.     Cervical back: Normal range of motion and neck supple.     Comments: Mild edema and tenderness to the dorsal aspect of the left wrist.  Skin:    General: Skin is warm and dry.  Neurological:     General: No focal deficit present.     Mental Status: He is alert and oriented to person, place, and time. Mental status is at baseline.     (all labs  ordered are listed, but only abnormal results are displayed) Labs Reviewed - No data to display  EKG: None  Radiology: No results found.   Procedures   Medications Ordered in the ED - No data to display                                  Medical Decision Making Patient presents with recurrent left wrist pain.  He reports that he strained the wrist yesterday when he attempted to catch a case of water .  X-ray imaging does not show new injury.  Patient will  continue to use his thumb spica splint.  He has a sling at home.  He has a follow-up appointment already scheduled with hand / orthopedics for early February.  Amount and/or Complexity of Data Reviewed Radiology: ordered.  Risk Prescription drug management.        Final diagnoses:  Injury of left wrist, initial encounter    ED Discharge Orders          Ordered    oxycodone  (OXY-IR) 5 MG capsule  Every 6 hours PRN,   Status:  Discontinued        07/28/24 1233    oxycodone  (OXY-IR) 5 MG capsule  Every 6 hours PRN        07/28/24 1233               Laurice Maude BROCKS, MD 07/28/24 1237  "

## 2024-07-30 NOTE — Telephone Encounter (Signed)
 Patient reports that he noticed after calling us , that message was from 06/10/2024. NFN.

## 2024-07-31 ENCOUNTER — Other Ambulatory Visit: Payer: Self-pay | Admitting: Emergency Medicine

## 2024-08-19 ENCOUNTER — Ambulatory Visit: Admitting: Emergency Medicine

## 2024-10-22 ENCOUNTER — Encounter

## 2024-10-22 ENCOUNTER — Ambulatory Visit: Admitting: Pulmonary Disease

## 2025-01-13 ENCOUNTER — Ambulatory Visit
